# Patient Record
Sex: Male | Born: 1960
Health system: Southern US, Community
[De-identification: ages and names within clinical notes are randomized; demographics above are authoritative.]

## PROBLEM LIST (undated history)

## (undated) DIAGNOSIS — N183 Chronic kidney disease, stage 3 unspecified: Secondary | ICD-10-CM

## (undated) DIAGNOSIS — I484 Atypical atrial flutter: Secondary | ICD-10-CM

## (undated) DIAGNOSIS — I442 Atrioventricular block, complete: Secondary | ICD-10-CM

## (undated) DIAGNOSIS — I2510 Atherosclerotic heart disease of native coronary artery without angina pectoris: Secondary | ICD-10-CM

## (undated) DIAGNOSIS — I472 Ventricular tachycardia: Secondary | ICD-10-CM

## (undated) DIAGNOSIS — G459 Transient cerebral ischemic attack, unspecified: Secondary | ICD-10-CM

## (undated) DIAGNOSIS — I509 Heart failure, unspecified: Secondary | ICD-10-CM

## (undated) DIAGNOSIS — I219 Acute myocardial infarction, unspecified: Secondary | ICD-10-CM

## (undated) DIAGNOSIS — J449 Chronic obstructive pulmonary disease, unspecified: Secondary | ICD-10-CM

## (undated) DIAGNOSIS — E7800 Pure hypercholesterolemia, unspecified: Secondary | ICD-10-CM

## (undated) DIAGNOSIS — F419 Anxiety disorder, unspecified: Secondary | ICD-10-CM

## (undated) DIAGNOSIS — I6521 Occlusion and stenosis of right carotid artery: Secondary | ICD-10-CM

## (undated) DIAGNOSIS — I10 Essential (primary) hypertension: Secondary | ICD-10-CM

## (undated) DIAGNOSIS — E663 Overweight: Secondary | ICD-10-CM

## (undated) HISTORY — DX: Atypical atrial flutter: I48.4

## (undated) HISTORY — DX: Ventricular tachycardia: I47.2

## (undated) HISTORY — PX: EYE MUSCLE SURGERY: SHX370

## (undated) HISTORY — DX: Overweight: E66.3

---

## 2000-11-09 ENCOUNTER — Emergency Department (HOSPITAL_COMMUNITY): Admission: EM | Admit: 2000-11-09 | Discharge: 2000-11-09 | Payer: Self-pay | Admitting: Emergency Medicine

## 2000-11-09 ENCOUNTER — Encounter: Payer: Self-pay | Admitting: Emergency Medicine

## 2001-06-05 ENCOUNTER — Encounter: Admission: RE | Admit: 2001-06-05 | Discharge: 2001-09-03 | Payer: Self-pay | Admitting: Sports Medicine

## 2001-08-08 HISTORY — PX: LACERATION REPAIR: SHX5168

## 2002-11-29 ENCOUNTER — Emergency Department (HOSPITAL_COMMUNITY): Admission: EM | Admit: 2002-11-29 | Discharge: 2002-11-29 | Payer: Self-pay | Admitting: Emergency Medicine

## 2004-01-11 ENCOUNTER — Emergency Department (HOSPITAL_COMMUNITY): Admission: EM | Admit: 2004-01-11 | Discharge: 2004-01-11 | Payer: Self-pay | Admitting: Emergency Medicine

## 2014-08-08 DIAGNOSIS — G459 Transient cerebral ischemic attack, unspecified: Secondary | ICD-10-CM

## 2014-08-08 HISTORY — DX: Transient cerebral ischemic attack, unspecified: G45.9

## 2014-11-23 ENCOUNTER — Emergency Department (HOSPITAL_COMMUNITY)
Admission: EM | Admit: 2014-11-23 | Discharge: 2014-11-23 | Disposition: A | Discharge status: Home / Self Care | Payer: Self-pay | Attending: Emergency Medicine | Admitting: Emergency Medicine

## 2014-11-23 ENCOUNTER — Emergency Department (HOSPITAL_COMMUNITY): Payer: Self-pay

## 2014-11-23 ENCOUNTER — Encounter (HOSPITAL_COMMUNITY): Payer: Self-pay

## 2014-11-23 DIAGNOSIS — Z87891 Personal history of nicotine dependence: Secondary | ICD-10-CM | POA: Insufficient documentation

## 2014-11-23 DIAGNOSIS — G459 Transient cerebral ischemic attack, unspecified: Secondary | ICD-10-CM | POA: Insufficient documentation

## 2014-11-23 DIAGNOSIS — F1210 Cannabis abuse, uncomplicated: Secondary | ICD-10-CM | POA: Insufficient documentation

## 2014-11-23 DIAGNOSIS — I10 Essential (primary) hypertension: Secondary | ICD-10-CM | POA: Insufficient documentation

## 2014-11-23 HISTORY — DX: Essential (primary) hypertension: I10

## 2014-11-23 LAB — COMPREHENSIVE METABOLIC PANEL
ALT: 52 U/L (ref 0–53)
AST: 46 U/L — AB (ref 0–37)
Albumin: 3.7 g/dL (ref 3.5–5.2)
Alkaline Phosphatase: 63 U/L (ref 39–117)
Anion gap: 10 (ref 5–15)
BUN: 12 mg/dL (ref 6–23)
CO2: 23 mmol/L (ref 19–32)
Calcium: 9.3 mg/dL (ref 8.4–10.5)
Chloride: 102 mmol/L (ref 96–112)
Creatinine, Ser: 0.95 mg/dL (ref 0.50–1.35)
GFR calc Af Amer: >90 mL/min (ref >90)
GFR calc non Af Amer: >90 mL/min (ref >90)
Glucose, Bld: 90 mg/dL (ref 70–99)
Potassium: 4.8 mmol/L (ref 3.5–5.1)
Sodium: 135 mmol/L (ref 135–145)
Total Bilirubin: 1.5 mg/dL — ABNORMAL HIGH (ref 0.3–1.2)
Total Protein: 6.6 g/dL (ref 6.0–8.3)

## 2014-11-23 LAB — CBC
HEMATOCRIT: 44.9 % (ref 39.0–52.0)
HEMOGLOBIN: 15.7 g/dL (ref 13.0–17.0)
MCH: 31.7 pg (ref 26.0–34.0)
MCHC: 35 g/dL (ref 30.0–36.0)
MCV: 90.7 fL (ref 78.0–100.0)
Platelets: 203 10*3/uL (ref 150–400)
RBC: 4.95 MIL/uL (ref 4.22–5.81)
RDW: 13.5 % (ref 11.5–15.5)
WBC: 8.6 10*3/uL (ref 4.0–10.5)

## 2014-11-23 LAB — RAPID URINE DRUG SCREEN, HOSP PERFORMED
Amphetamines: NOT DETECTED
Barbiturates: NOT DETECTED
Benzodiazepines: NOT DETECTED
Cocaine: NOT DETECTED
Opiates: NOT DETECTED
Tetrahydrocannabinol: POSITIVE — AB

## 2014-11-23 MED ORDER — ASPIRIN 81 MG PO CHEW
324.0000 mg | CHEWABLE_TABLET | Freq: Once | ORAL | Status: AC
  Administered 2014-11-23: 324 mg via ORAL
  Filled 2014-11-23: qty 4

## 2014-11-23 MED ORDER — LISINOPRIL 10 MG PO TABS
10.0000 mg | ORAL_TABLET | Freq: Once | ORAL | Status: AC
  Administered 2014-11-23: 10 mg via ORAL
  Filled 2014-11-23: qty 1

## 2014-11-23 MED ORDER — GADOBENATE DIMEGLUMINE 529 MG/ML IV SOLN
20.0000 mL | Freq: Once | INTRAVENOUS | Status: AC | PRN
  Administered 2014-11-23: 20 mL via INTRAVENOUS

## 2014-11-23 MED ORDER — LISINOPRIL 20 MG PO TABS: 20.0000 mg | ORAL_TABLET | Freq: Every day | ORAL | Status: DC

## 2014-11-23 NOTE — ED Provider Notes (Signed)
Assumed pt care in sign out with MRa pending. R sided numbness, but with severe ipsilateral R ICA stenosis. Symptoms do not clinically correlate to this. Does additionally have stenosis of L PCA though which could though. Discussed with neurology. Will start on 325 mg ASA. Script provided for lisinopril. Outpt FU with PCP and vascular.   Ct Head Wo Contrast  11/23/2014   CLINICAL DATA:  Onset 2 days ago pt was riding in car, pt had numbness in right arm and right leg. Lasted 5-10 min. Pt concerned. Pt reports tingling in right arm but pt had past injury and has intermittant pain and tingling in that arm. No other s/s noted. No h/o stroke/seizure. No h/o cancer. No surgeries to head.  EXAM: CT HEAD WITHOUT CONTRAST  TECHNIQUE: Contiguous axial images were obtained from the base of the skull through the vertex without intravenous contrast.  COMPARISON:  None.  FINDINGS: Ventricles are normal in configuration. There is ventricular and sulcal enlargement reflecting mild volume loss. No hydrocephalus.  There are no parenchymal masses or mass effect. Subtle areas of white matter hypoattenuation noted likely due to chronic microvascular ischemic change.  There is no evidence a cortical infarct.  There are no extra-axial masses or abnormal fluid collections.  There is no intracranial hemorrhage.  Mucous retention cysts are noted in the maxillary sinuses. Mastoid air cells are clear. No skull lesion.  IMPRESSION: 1. No acute intracranial abnormalities. Mild volume loss and minimal chronic microvascular ischemic change.   Electronically Signed   By: Amie Portland M.D.   On: 11/23/2014 12:48   Mr Shirlee Latch Wo Contrast  11/23/2014   CLINICAL DATA:  54 year old male with episode of transient right side numbness 2 days ago.  Initial encounter.  EXAM: MRA NECK WITHOUT AND WITH CONTRAST  MRA HEAD WITHOUT CONTRAST  TECHNIQUE: Multiplanar and multiecho pulse sequences of the neck were obtained without and with intravenous  contrast. Angiographic images of the neck were obtained using MRA technique without and with intravenous contast.; Angiographic images of the Circle of Willis were obtained using MRA technique without intravenous contrast.  CONTRAST:  35mL MULTIHANCE GADOBENATE DIMEGLUMINE 529 MG/ML IV SOLN  COMPARISON:  Brain MRI without contrast 1321 hours today.  FINDINGS: MRA NECK FINDINGS  Precontrast time-of-flight imaging of the neck reveals antegrade flow in both carotid and vertebral arteries from the thoracic inlet to the skullbase.  Post-contrast MRA images reveal a 3 vessel arch configuration.  Both vertebral artery origins are normal. The right vertebral artery is normal to the vertebrobasilar junction. On the left there is a focal tortuosity in the V2 segment (series 77414, image 14) with a kinked appearance, otherwise no left vertebral artery stenosis.  The right CCA appears normal to the right carotid bifurcation, but there is been a 10 mm segment of severe irregularity at the origin of the right ICA extending into the bulb. High-grade stenosis results, estimated at 70 % with respect to the distal vessel. Despite this, the right ICA remains patent and otherwise appears normal to the skullbase.  The left CCA is normal to the left carotid bifurcation. There is irregularity at the left ICA origin and posterior bulb compatible with atherosclerosis, but no hemodynamically significant stenosis occurs.  MRA HEAD FINDINGS  Antegrade flow in the posterior circulation with codominant distal vertebral arteries. Normal vertebrobasilar junction. Normal right PICA and bilateral AICA origins, the left appears dominant. Tortuous basilar artery without stenosis. SCA and PCA origins are normal. Posterior communicating arteries are diminutive  or absent.  The right PCA branches are within normal limits. There is moderate to severe stenosis of the distal left P2 segment (series 205, image 13) with preserved distal flow.  Symmetric  Antegrade flow in both ICA siphons. No siphon stenosis. Normal ophthalmic artery origins. Normal carotid termini. Normal MCA and ACA origins. Diminutive anterior communicating artery. Visualized ACA branches are within normal limits. Visualized right MCA branches are within normal limits. Left MCA M1 segment, trifurcation, and visualized left MCA branches are within normal limits.  IMPRESSION: 1. Atherosclerosis at the right carotid bifurcation resulting in High-grade right ICA origin stenosis (estimated at 70%). 2. Mild atherosclerosis at the left carotid bifurcation without stenosis. 3. Mild tortuosity and stenosis of the left vertebral artery V2 segment. 4. Moderate to severe stenosis of the left PCA P2 segment. 5. Normal intracranial anterior circulation.   Electronically Signed   By: Odessa Fleming M.D.   On: 11/23/2014 17:34   Mr Angiogram Neck W Wo Contrast  11/23/2014   CLINICAL DATA:  54 year old male with episode of transient right side numbness 2 days ago.  Initial encounter.  EXAM: MRA NECK WITHOUT AND WITH CONTRAST  MRA HEAD WITHOUT CONTRAST  TECHNIQUE: Multiplanar and multiecho pulse sequences of the neck were obtained without and with intravenous contrast. Angiographic images of the neck were obtained using MRA technique without and with intravenous contast.; Angiographic images of the Circle of Willis were obtained using MRA technique without intravenous contrast.  CONTRAST:  20mL MULTIHANCE GADOBENATE DIMEGLUMINE 529 MG/ML IV SOLN  COMPARISON:  Brain MRI without contrast 1321 hours today.  FINDINGS: MRA NECK FINDINGS  Precontrast time-of-flight imaging of the neck reveals antegrade flow in both carotid and vertebral arteries from the thoracic inlet to the skullbase.  Post-contrast MRA images reveal a 3 vessel arch configuration.  Both vertebral artery origins are normal. The right vertebral artery is normal to the vertebrobasilar junction. On the left there is a focal tortuosity in the V2 segment  (series 16109, image 14) with a kinked appearance, otherwise no left vertebral artery stenosis.  The right CCA appears normal to the right carotid bifurcation, but there is been a 10 mm segment of severe irregularity at the origin of the right ICA extending into the bulb. High-grade stenosis results, estimated at 70 % with respect to the distal vessel. Despite this, the right ICA remains patent and otherwise appears normal to the skullbase.  The left CCA is normal to the left carotid bifurcation. There is irregularity at the left ICA origin and posterior bulb compatible with atherosclerosis, but no hemodynamically significant stenosis occurs.  MRA HEAD FINDINGS  Antegrade flow in the posterior circulation with codominant distal vertebral arteries. Normal vertebrobasilar junction. Normal right PICA and bilateral AICA origins, the left appears dominant. Tortuous basilar artery without stenosis. SCA and PCA origins are normal. Posterior communicating arteries are diminutive or absent.  The right PCA branches are within normal limits. There is moderate to severe stenosis of the distal left P2 segment (series 205, image 13) with preserved distal flow.  Symmetric Antegrade flow in both ICA siphons. No siphon stenosis. Normal ophthalmic artery origins. Normal carotid termini. Normal MCA and ACA origins. Diminutive anterior communicating artery. Visualized ACA branches are within normal limits. Visualized right MCA branches are within normal limits. Left MCA M1 segment, trifurcation, and visualized left MCA branches are within normal limits.  IMPRESSION: 1. Atherosclerosis at the right carotid bifurcation resulting in High-grade right ICA origin stenosis (estimated at 70%). 2. Mild atherosclerosis  at the left carotid bifurcation without stenosis. 3. Mild tortuosity and stenosis of the left vertebral artery V2 segment. 4. Moderate to severe stenosis of the left PCA P2 segment. 5. Normal intracranial anterior circulation.    Electronically Signed   By: Odessa Fleming M.D.   On: 11/23/2014 17:34   Mr Brain Wo Contrast  11/23/2014   CLINICAL DATA:  54 year old male with episode of transient right side numbness 2 days ago. Initial encounter.  EXAM: MRI HEAD WITHOUT CONTRAST  TECHNIQUE: Multiplanar, multiecho pulse sequences of the brain and surrounding structures were obtained without intravenous contrast.  COMPARISON:  Head CT without contrast 1248 hours today.  FINDINGS: Cerebral volume is normal. No restricted diffusion to suggest acute infarction. No midline shift, mass effect, evidence of mass lesion, ventriculomegaly, extra-axial collection or acute intracranial hemorrhage. Cervicomedullary junction and pituitary are within normal limits. Major intracranial vascular flow voids are within normal limits.  Scattered subcentimeter and patchy cerebral white matter T2 and FLAIR hyperintensity in a nonspecific configuration. Both periventricular and subcortical involvement is noted. No cortical encephalomalacia identified. No chronic blood products in the brain. Deep gray matter nuclei, brainstem and cerebellum are within normal limits.  Visible internal auditory structures appear normal. Mastoids are clear. Negative paranasal sinuses aside from maxillary mucous retention cyst. Visualized orbit soft tissues are within normal limits. Normal visualized cervical spine. Normal bone marrow signal. Visualized scalp soft tissues are within normal limits.  IMPRESSION: 1.  No acute intracranial abnormality. 2. Moderate for age nonspecific cerebral white matter signal changes. Differential considerations include accelerated small vessel ischemia, demyelination, sequelae of trauma, hypercoagulable state, vasculitis, migraines, or prior infection.   Electronically Signed   By: Odessa Fleming M.D.   On: 11/23/2014 14:32    Raeford Razor, MD 11/23/14 [phone]

## 2014-11-23 NOTE — ED Provider Notes (Signed)
CSN: [id number]     Arrival date & time 11/23/14  1123 History   First MD Initiated Contact with Patient 11/23/14 1205     Chief Complaint  Patient presents with  . Numbness     (Consider location/radiation/quality/duration/timing/severity/associated sxs/prior Treatment) HPI  54 year old male who had an episode of right sided numbness on Friday. He reports he episode as lasting 5-10 minutes. Symptoms resolved and have not returned. He denies any headache. He was sitting in a truck and did not note any weakness. He did not try to speak during the episode. He reports a history of hypertension. He states that he has been off of his medications as his blood pressure had normalized. He was followed at [facility] but has not been seen for 2 years. He reports he follows his blood pressure at home and it is usually in the low 100s. He has not had any previous episodes. He has not had any head injury or headache.  Past Medical History  Diagnosis Date  . Hypertension    Past Surgical History  Procedure Laterality Date  . Arm surgery Right 2003   History reviewed. No pertinent family history. History  Substance Use Topics  . Smoking status: Former Games developer  . Smokeless tobacco: Not on file  . Alcohol Use: No    Review of Systems  All other systems reviewed and are negative.     Allergies  Review of patient's allergies indicates no known allergies.  Home Medications   Prior to Admission medications   Not on File   BP 166/102 mmHg  Pulse 66  Temp(Src) 98.1 F (36.7 C) (Oral)  Resp 16  Ht 5' 10.5" (1.791 m)  Wt 240 lb 4.8 oz (108.999 kg)  BMI 33.98 kg/m2  SpO2 97% Physical Exam  Constitutional: He is oriented to person, place, and time. He appears well-developed and well-nourished.  HENT:  Head: Normocephalic and atraumatic.  Right Ear: Tympanic membrane and external ear normal.  Left Ear: Tympanic membrane and external ear normal.  Nose: Nose normal. Right sinus exhibits no  maxillary sinus tenderness and no frontal sinus tenderness. Left sinus exhibits no maxillary sinus tenderness and no frontal sinus tenderness.  Eyes: Conjunctivae and EOM are normal. Pupils are equal, round, and reactive to light. Right eye exhibits no nystagmus. Left eye exhibits no nystagmus.  Neck: Normal range of motion. Neck supple.  Cardiovascular: Normal rate, regular rhythm, normal heart sounds and intact distal pulses.   Pulmonary/Chest: Effort normal and breath sounds normal. No respiratory distress. He exhibits no tenderness.  Abdominal: Soft. Bowel sounds are normal. He exhibits no distension and no mass. There is no tenderness.  Musculoskeletal: Normal range of motion. He exhibits no edema or tenderness.  Neurological: He is alert and oriented to person, place, and time. He has normal strength and normal reflexes. No sensory deficit. He displays a negative Romberg sign. GCS eye subscore is 4. GCS verbal subscore is 5. GCS motor subscore is 6.  Reflex Scores:      Tricep reflexes are 2+ on the right side and 2+ on the left side.      Bicep reflexes are 2+ on the right side and 2+ on the left side.      Brachioradialis reflexes are 2+ on the right side and 2+ on the left side.      Patellar reflexes are 2+ on the right side and 2+ on the left side.      Achilles reflexes are 2+ on the  right side and 2+ on the left side. Patient with normal gait without ataxia, shuffling, spasm, or antalgia. Speech is normal without dysarthria, dysphasia, or aphasia. Muscle strength is 5/5 in bilateral shoulders, elbow flexor and extensors, wrist flexor and extensors, and intrinsic hand muscles. 5/5 bilateral lower extremity hip flexors, extensors, knee flexors and extensors, and ankle dorsi and plantar flexors.    Skin: Skin is warm and dry. No rash noted.  Psychiatric: He has a normal mood and affect. His behavior is normal. Judgment and thought content normal.  Nursing note and vitals  reviewed.   ED Course  Procedures (including critical care time) Labs Review Labs Reviewed  COMPREHENSIVE METABOLIC PANEL - Abnormal; Notable for the following:    AST 46 (*)    Total Bilirubin 1.5 (*)    All other components within normal limits  CBC  URINE RAPID DRUG SCREEN (HOSP PERFORMED)    Imaging Review Ct Head Wo Contrast  11/23/2014   CLINICAL DATA:  Onset 2 days ago pt was riding in car, pt had numbness in right arm and right leg. Lasted 5-10 min. Pt concerned. Pt reports tingling in right arm but pt had past injury and has intermittant pain and tingling in that arm. No other s/s noted. No h/o stroke/seizure. No h/o cancer. No surgeries to head.  EXAM: CT HEAD WITHOUT CONTRAST  TECHNIQUE: Contiguous axial images were obtained from the base of the skull through the vertex without intravenous contrast.  COMPARISON:  None.  FINDINGS: Ventricles are normal in configuration. There is ventricular and sulcal enlargement reflecting mild volume loss. No hydrocephalus.  There are no parenchymal masses or mass effect. Subtle areas of white matter hypoattenuation noted likely due to chronic microvascular ischemic change.  There is no evidence a cortical infarct.  There are no extra-axial masses or abnormal fluid collections.  There is no intracranial hemorrhage.  Mucous retention cysts are noted in the maxillary sinuses. Mastoid air cells are clear. No skull lesion.  IMPRESSION: 1. No acute intracranial abnormalities. Mild volume loss and minimal chronic microvascular ischemic change.   Electronically Signed   By: Amie Portland M.D.   On: 11/23/2014 12:48   Mr Brain Wo Contrast  11/23/2014   CLINICAL DATA:  54 year old male with episode of transient right side numbness 2 days ago. Initial encounter.  EXAM: MRI HEAD WITHOUT CONTRAST  TECHNIQUE: Multiplanar, multiecho pulse sequences of the brain and surrounding structures were obtained without intravenous contrast.  COMPARISON:  Head CT without  contrast 1248 hours today.  FINDINGS: Cerebral volume is normal. No restricted diffusion to suggest acute infarction. No midline shift, mass effect, evidence of mass lesion, ventriculomegaly, extra-axial collection or acute intracranial hemorrhage. Cervicomedullary junction and pituitary are within normal limits. Major intracranial vascular flow voids are within normal limits.  Scattered subcentimeter and patchy cerebral white matter T2 and FLAIR hyperintensity in a nonspecific configuration. Both periventricular and subcortical involvement is noted. No cortical encephalomalacia identified. No chronic blood products in the brain. Deep gray matter nuclei, brainstem and cerebellum are within normal limits.  Visible internal auditory structures appear normal. Mastoids are clear. Negative paranasal sinuses aside from maxillary mucous retention cyst. Visualized orbit soft tissues are within normal limits. Normal visualized cervical spine. Normal bone marrow signal. Visualized scalp soft tissues are within normal limits.  IMPRESSION: 1.  No acute intracranial abnormality. 2. Moderate for age nonspecific cerebral white matter signal changes. Differential considerations include accelerated small vessel ischemia, demyelination, sequelae of trauma, hypercoagulable state, vasculitis, migraines,  or prior infection.   Electronically Signed   By: Odessa Fleming M.D.   On: 11/23/2014 14:32     EKG Interpretation   Date/Time:  Sunday November 23 2014 12:27:20 EDT Ventricular Rate:  68 PR Interval:  170 QRS Duration: 93 QT Interval:  386 QTC Calculation: 410 R Axis:   -8 Text Interpretation:  Sinus rhythm Low voltage, precordial leads Confirmed  by Madix Blowe MD, Duwayne Heck ([id number]) on 11/23/2014 1:44:25 PM      MDM   Final diagnoses:  Transient cerebral ischemia, unspecified transient cerebral ischemia type  Essential hypertension    ABCD2 score- 3-4 Patient care discussed with Dr. Leroy Kennedy.  He advises MR a of brain and neck.  Patient does not currently have outpatient provider. Carotid ultrasounds of neck not obtainable on the weekend. Given the patient's borderline ABCD 2score, and otherwise normal workup, carotid assessment is essential in valuation and patient disposition. I have discussed this the patient he voices understanding and is currently undergoing MRA of head and neck. Dr. Kellie Shropshire can be reconsulted if this is abnormal, otherwise patient can be discharged with follow-up. He is given lisinopril 10 mg here which is what he has been on in the past. Plan to restart his antihypertensives and have close follow-up of blood pressure.  MRI brain with some a moderate changes in white matter signal of unclear etiology.  Plan referral to neurology for follow up.   Discussed with Dr. Juleen China and he will review mra and arrange admission or follow up.     Margarita Grizzle, MD 11/23/14 [phone]

## 2014-11-23 NOTE — ED Notes (Signed)
Pt states he "can not urinate and will not be able to any time soon"

## 2014-11-23 NOTE — Discharge Instructions (Signed)
Hypertension Hypertension is another name for high blood pressure. High blood pressure forces your heart to work harder to pump blood. A blood pressure reading has two numbers, which includes a higher number over a lower number (example: 110/72). HOME CARE   Have your blood pressure rechecked by your doctor.  Only take medicine as told by your doctor. Follow the directions carefully. The medicine does not work as well if you skip doses. Skipping doses also puts you at risk for problems.  Do not smoke.  Monitor your blood pressure at home as told by your doctor. GET HELP IF:  You think you are having a reaction to the medicine you are taking.  You have repeat headaches or feel dizzy.  You have puffiness (swelling) in your ankles.  You have trouble with your vision. GET HELP RIGHT AWAY IF:   You get a very bad headache and are confused.  You feel weak, numb, or faint.  You get chest or belly (abdominal) pain.  You throw up (vomit).  You cannot breathe very well. MAKE SURE YOU:   Understand these instructions.  Will watch your condition.  Will get help right away if you are not doing well or get worse. Document Released: 01/11/2008 Document Revised: 07/30/2013 Document Reviewed: 05/17/2013 [facility] Patient Information 2015 [place], [place]. This information is not intended to replace advice given to you by your health care provider. Make sure you discuss any questions you have with your health care provider. Transient Ischemic Attack A transient ischemic attack (TIA) is a "warning stroke" that causes stroke-like symptoms. Unlike a stroke, a TIA does not cause permanent damage to the brain. The symptoms of a TIA can happen very fast and do not last long. It is important to know the symptoms of a TIA and what to do. This can help prevent a major stroke or death. CAUSES   A TIA is caused by a temporary blockage in an artery in the brain or neck (carotid artery). The blockage does  not allow the brain to get the blood supply it needs and can cause different symptoms. The blockage can be caused by either:  A blood clot.  Fatty buildup (plaque) in a neck or brain artery. RISK FACTORS  High blood pressure (hypertension).  High cholesterol.  Diabetes mellitus.  Heart disease.  The build up of plaque in the blood vessels (peripheral artery disease or atherosclerosis).  The build up of plaque in the blood vessels providing blood and oxygen to the brain (carotid artery stenosis).  An abnormal heart rhythm (atrial fibrillation).  Obesity.  Smoking.  Taking oral contraceptives (especially in combination with smoking).  Physical inactivity.  A diet high in fats, salt (sodium), and calories.  Alcohol use.  Use of illegal drugs (especially cocaine and methamphetamine).  Being male.  Being African American.  Being over the age of 51.  Family history of stroke.  Previous history of blood clots, stroke, TIA, or heart attack.  Sickle cell disease. SYMPTOMS  TIA symptoms are the same as a stroke but are temporary. These symptoms usually develop suddenly, or may be newly present upon awakening from sleep:  Sudden weakness or numbness of the face, arm, or leg, especially on one side of the body.  Sudden trouble walking or difficulty moving arms or legs.  Sudden confusion.  Sudden personality changes.  Trouble speaking (aphasia) or understanding.  Difficulty swallowing.  Sudden trouble seeing in one or both eyes.  Double vision.  Dizziness.  Loss of  balance or coordination.  Sudden severe headache with no known cause.  Trouble reading or writing.  Loss of bowel or bladder control.  Loss of consciousness. DIAGNOSIS  Your caregiver may be able to determine the presence or absence of a TIA based on your symptoms, history, and physical exam. Computed tomography (CT scan) of the brain is usually performed to help identify a TIA. Other tests  may be done to diagnose a TIA. These tests may include:  Electrocardiography.  Continuous heart monitoring.  Echocardiography.  Carotid ultrasonography.  Magnetic resonance imaging (MRI).  A scan of the brain circulation.  Blood tests. PREVENTION  The risk of a TIA can be decreased by appropriately treating high blood pressure, high cholesterol, diabetes, heart disease, and obesity and by quitting smoking, limiting alcohol, and staying physically active. TREATMENT  Time is of the essence. Since the symptoms of TIA are the same as a stroke, it is important to seek treatment as soon as possible because you may need a medicine to dissolve the clot (thrombolytic) that cannot be given if too much time has passed. Treatment options vary. Treatment options may include rest, oxygen, intravenous (IV) fluids, and medicines to thin the blood (anticoagulants). Medicines and diet may be used to address diabetes, high blood pressure, and other risk factors. Measures will be taken to prevent short-term and long-term complications, including infection from breathing foreign material into the lungs (aspiration pneumonia), blood clots in the legs, and falls. Treatment options include procedures to either remove plaque in the carotid arteries or dilate carotid arteries that have narrowed due to plaque. Those procedures are:  Carotid endarterectomy.  Carotid angioplasty and stenting. HOME CARE INSTRUCTIONS   Take all medicines prescribed by your caregiver. Follow the directions carefully. Medicines may be used to control risk factors for a stroke. Be sure you understand all your medicine instructions.  You may be told to take aspirin or the anticoagulant warfarin. Warfarin needs to be taken exactly as instructed.  Taking too much or too little warfarin is dangerous. Too much warfarin increases the risk of bleeding. Too little warfarin continues to allow the risk for blood clots. While taking warfarin, you  will need to have regular blood tests to measure your blood clotting time. A PT blood test measures how long it takes for blood to clot. Your PT is used to calculate another value called an INR. Your PT and INR help your caregiver to adjust your dose of warfarin. The dose can change for many reasons. It is critically important that you take warfarin exactly as prescribed.  Many foods, especially foods high in vitamin K can interfere with warfarin and affect the PT and INR. Foods high in vitamin K include spinach, kale, broccoli, cabbage, collard and turnip greens, brussels sprouts, peas, cauliflower, seaweed, and parsley as well as beef and pork liver, green tea, and soybean oil. You should eat a consistent amount of foods high in vitamin K. Avoid major changes in your diet, or notify your caregiver before changing your diet. Arrange a visit with a dietitian to answer your questions.  Many medicines can interfere with warfarin and affect the PT and INR. You must tell your caregiver about any and all medicines you take, this includes all vitamins and supplements. Be especially cautious with aspirin and anti-inflammatory medicines. Do not take or discontinue any prescribed or over-the-counter medicine except on the advice of your caregiver or pharmacist.  Warfarin can have side effects, such as excessive bruising or bleeding. You  will need to hold pressure over cuts for longer than usual. Your caregiver or pharmacist will discuss other potential side effects.  Avoid sports or activities that may cause injury or bleeding.  Be mindful when shaving, flossing your teeth, or handling sharp objects.  Alcohol can change the body's ability to handle warfarin. It is best to avoid alcoholic drinks or consume only very small amounts while taking warfarin. Notify your caregiver if you change your alcohol intake.  Notify your dentist or other caregivers before procedures.  Eat a diet that includes 5 or more  servings of fruits and vegetables each day. This may reduce the risk of stroke. Certain diets may be prescribed to address high blood pressure, high cholesterol, diabetes, or obesity.  A low-sodium, low-saturated fat, low-trans fat, low-cholesterol diet is recommended to manage high blood pressure.  A low-saturated fat, low-trans fat, low-cholesterol, and high-fiber diet may control cholesterol levels.  A controlled-carbohydrate, controlled-sugar diet is recommended to manage diabetes.  A reduced-calorie, low-sodium, low-saturated fat, low-trans fat, low-cholesterol diet is recommended to manage obesity.  Maintain a healthy weight.  Stay physically active. It is recommended that you get at least 30 minutes of activity on most or all days.  Do not smoke.  Limit alcohol use even if you are not taking warfarin. Moderate alcohol use is considered to be:  No more than 2 drinks each day for men.  No more than 1 drink each day for nonpregnant women.  Stop drug abuse.  Home safety. A safe home environment is important to reduce the risk of falls. Your caregiver may arrange for specialists to evaluate your home. Having grab bars in the bedroom and bathroom is often important. Your caregiver may arrange for equipment to be used at home, such as raised toilets and a seat for the shower.  Follow all instructions for follow-up with your caregiver. This is very important. This includes any referrals and lab tests. Proper follow up can prevent a stroke or another TIA from occurring. SEEK MEDICAL CARE IF:  You have personality changes.  You have difficulty swallowing.  You are seeing double.  You have dizziness.  You have a fever.  You have skin breakdown. SEEK IMMEDIATE MEDICAL CARE IF:  Any of these symptoms may represent a serious problem that is an emergency. Do not wait to see if the symptoms will go away. Get medical help right away. Call your local emergency services (911 in [place] Do  not drive yourself to the hospital.  You have sudden weakness or numbness of the face, arm, or leg, especially on one side of the body.  You have sudden trouble walking or difficulty moving arms or legs.  You have sudden confusion.  You have trouble speaking (aphasia) or understanding.  You have sudden trouble seeing in one or both eyes.  You have a loss of balance or coordination.  You have a sudden, severe headache with no known cause.  You have new chest pain or an irregular heartbeat.  You have a partial or total loss of consciousness. MAKE SURE YOU:   Understand these instructions.  Will watch your condition.  Will get help right away if you are not doing well or get worse. Document Released: 05/04/2005 Document Revised: 07/30/2013 Document Reviewed: 10/30/2013 [facility] Patient Information 2015 [place], [place]. This information is not intended to replace advice given to you by your health care provider. Make sure you discuss any questions you have with your health care provider.  DASH Eating Plan  DASH stands for "Dietary Approaches to Stop Hypertension." The DASH eating plan is a healthy eating plan that has been shown to reduce high blood pressure (hypertension). Additional health benefits may include reducing the risk of type 2 diabetes mellitus, heart disease, and stroke. The DASH eating plan may also help with weight loss. WHAT DO I NEED TO KNOW ABOUT THE DASH EATING PLAN? For the DASH eating plan, you will follow these general guidelines:  Choose foods with a percent daily value for sodium of less than 5% (as listed on the food label).  Use salt-free seasonings or herbs instead of table salt or sea salt.  Check with your health care provider or pharmacist before using salt substitutes.  Eat lower-sodium products, often labeled as "lower sodium" or "no salt added."  Eat fresh foods.  Eat more vegetables, fruits, and low-fat dairy products.  Choose whole grains.  Look for the word "whole" as the first word in the ingredient list.  Choose fish and skinless chicken or [place] more often than red meat. Limit fish, poultry, and meat to 6 oz (170 g) each day.  Limit sweets, desserts, sugars, and sugary drinks.  Choose heart-healthy fats.  Limit cheese to 1 oz (28 g) per day.  Eat more home-cooked food and less restaurant, buffet, and fast food.  Limit fried foods.  Cook foods using methods other than frying.  Limit canned vegetables. If you do use them, rinse them well to decrease the sodium.  When eating at a restaurant, ask that your food be prepared with less salt, or no salt if possible. WHAT FOODS CAN I EAT? Seek help from a dietitian for individual calorie needs. Grains Whole grain or whole wheat bread. Brown rice. Whole grain or whole wheat pasta. Quinoa, bulgur, and whole grain cereals. Low-sodium cereals. Corn or whole wheat flour tortillas. Whole grain cornbread. Whole grain crackers. Low-sodium crackers. Vegetables Fresh or frozen vegetables (raw, steamed, roasted, or grilled). Low-sodium or reduced-sodium tomato and vegetable juices. Low-sodium or reduced-sodium tomato sauce and paste. Low-sodium or reduced-sodium canned vegetables.  Fruits All fresh, canned (in natural juice), or frozen fruits. Meat and Other Protein Products Ground beef (85% or leaner), grass-fed beef, or beef trimmed of fat. Skinless chicken or [place]. Ground chicken or [place]. Pork trimmed of fat. All fish and seafood. Eggs. Dried beans, peas, or lentils. Unsalted nuts and seeds. Unsalted canned beans. Dairy Low-fat dairy products, such as skim or 1% milk, 2% or reduced-fat cheeses, low-fat ricotta or cottage cheese, or plain low-fat yogurt. Low-sodium or reduced-sodium cheeses. Fats and Oils Tub margarines without trans fats. Light or reduced-fat mayonnaise and salad dressings (reduced sodium). Avocado. Safflower, olive, or canola oils. Natural peanut or almond  butter. Other Unsalted popcorn and pretzels. The items listed above may not be a complete list of recommended foods or beverages. Contact your dietitian for more options. WHAT FOODS ARE NOT RECOMMENDED? Grains White bread. White pasta. White rice. Refined cornbread. Bagels and croissants. Crackers that contain trans fat. Vegetables Creamed or fried vegetables. Vegetables in a cheese sauce. Regular canned vegetables. Regular canned tomato sauce and paste. Regular tomato and vegetable juices. Fruits Dried fruits. Canned fruit in light or heavy syrup. Fruit juice. Meat and Other Protein Products Fatty cuts of meat. Ribs, chicken wings, bacon, sausage, bologna, salami, chitterlings, fatback, hot dogs, bratwurst, and packaged luncheon meats. Salted nuts and seeds. Canned beans with salt. Dairy Whole or 2% milk, cream, half-and-half, and cream cheese. Whole-fat or sweetened yogurt. Full-fat cheeses or  blue cheese. Nondairy creamers and whipped toppings. Processed cheese, cheese spreads, or cheese curds. Condiments Onion and garlic salt, seasoned salt, table salt, and sea salt. Canned and packaged gravies. Worcestershire sauce. Tartar sauce. Barbecue sauce. Teriyaki sauce. Soy sauce, including reduced sodium. Steak sauce. Fish sauce. Oyster sauce. Cocktail sauce. Horseradish. Ketchup and mustard. Meat flavorings and tenderizers. Bouillon cubes. Hot sauce. Tabasco sauce. Marinades. Taco seasonings. Relishes. Fats and Oils Butter, stick margarine, lard, shortening, ghee, and bacon fat. Coconut, palm kernel, or palm oils. Regular salad dressings. Other Pickles and olives. Salted popcorn and pretzels. The items listed above may not be a complete list of foods and beverages to avoid. Contact your dietitian for more information. WHERE CAN I FIND MORE INFORMATION? National Heart, Lung, and Blood Institute: [employer] Document Released: 07/14/2011 Document Revised:  12/09/2013 Document Reviewed: 05/29/2013 [facility] Patient Information 2015 [place], [place]. This information is not intended to replace advice given to you by your health care provider. Make sure you discuss any questions you have with your health care provider.  Carotid Artery Disease The carotid arteries are the two main arteries on either side of the neck that supply blood to the brain. Carotid artery disease, also called carotid artery stenosis, is the narrowing or blockage of one or both carotid arteries. Carotid artery disease increases your risk for a stroke or a transient ischemic attack (TIA). A TIA is an episode in which a waxy, fatty substance that accumulates within the artery (plaque) blocks blood flow to the brain. A TIA is considered a "warning stroke."  CAUSES   Buildup of plaque inside the carotid arteries (atherosclerosis) (common).  A weakened outpouching in an artery (aneurysm).  Inflammation of the carotid artery (arteritis).  A fibrous growth within the carotid artery (fibromuscular dysplasia).  Tissue death within the carotid artery due to radiation treatment (post-radiation necrosis).  Decreased blood flow due to spasms of the carotid artery (vasospasm).  Separation of the walls of the carotid artery (carotid dissection). RISK FACTORS  High cholesterol (dyslipidemia).   High blood pressure (hypertension).   Smoking.   Obesity.   Diabetes.   Family history of cardiovascular disease.   Inactivity or lack of regular exercise.   Being male. Men have an increased risk of developing atherosclerosis earlier in life than women.  SYMPTOMS  Carotid artery disease does not cause symptoms. DIAGNOSIS Diagnosis of carotid artery disease may include:   A physical exam. Your health care provider may hear an abnormal sound (bruit) when listening to the carotid arteries.   Specific tests that look at the blood flow in the carotid arteries. These tests  include:   Carotid artery ultrasonography.   Carotid or cerebral angiography.   Computerized tomographic angiography (CTA).   Magnetic resonance angiography (MRA).  TREATMENT  Treatment of carotid artery disease can include a combination of treatments. Treatment options include:  Surgery. You may have:   A carotid endarterectomy. This is a surgery to remove the blockages in the carotid arteries.   A carotid angioplasty with stenting. This is a nonsurgical interventional procedure. A wire mesh (stent) is used to widen the blocked carotid arteries.   Medicines to control blood pressure, cholesterol, and reduce blood clotting (antiplatelet therapy).   Adjusting your diet.   Lifestyle changes such as:   Quitting smoking.   Exercising as tolerated or as directed by your health care provider.   Controlling and maintaining a good blood pressure.   Keeping cholesterol levels under control.  HOME CARE INSTRUCTIONS  Take medicines only as directed by your health care provider. Make sure you understand all your medicine instructions. Do not stop your medicines without talking to your health care provider.   Follow your health care provider's diet instructions. It is important to eat a healthy diet that is low in saturated fats and includes plenty of fresh fruits, vegetables, and lean meats. High-fat, high-sodium foods as well as foods that are fried, overly processed, or have poor nutritional value should be avoided.  Maintain a healthy weight.   Stay physically active. It is recommended that you get at least 30 minutes of activity every day.   Do not use any tobacco products including cigarettes, chewing tobacco, or electronic cigarettes. If you need help quitting, ask your health care provider.  Limit alcohol use to:   No more than 2 drinks per day for men.   No more than 1 drink per day for nonpregnant women.   Do not use illegal drugs.   Keep all  follow-up visits as directed by your health care provider.  SEEK IMMEDIATE MEDICAL CARE IF:  You develop TIA or stroke symptoms. These include:   Sudden weakness or numbness on one side of the body, such as in the face, arm, or leg.   Sudden confusion.   Trouble speaking (aphasia) or understanding.   Sudden trouble seeing out of one or both eyes.   Sudden trouble walking.   Dizziness or feeling like you might faint.   Loss of balance or coordination.   Sudden severe headache with no known cause.   Sudden trouble swallowing (dysphagia).  If you have any of these symptoms, call your local emergency services (911 in [place] Do not drive yourself to the clinic or hospital. This is a medical emergency.  Document Released: 10/17/2011 Document Revised: 12/09/2013 Document Reviewed: 01/23/2013 [facility] Patient Information 2015 [place], [place]. This information is not intended to replace advice given to you by your health care provider. Make sure you discuss any questions you have with your health care provider.  Carotid Artery Disease The carotid arteries are the two main arteries on either side of the neck that supply blood to the brain. Carotid artery disease, also called carotid artery stenosis, is the narrowing or blockage of one or both carotid arteries. Carotid artery disease increases your risk for a stroke or a transient ischemic attack (TIA). A TIA is an episode in which a waxy, fatty substance that accumulates within the artery (plaque) blocks blood flow to the brain. A TIA is considered a "warning stroke."  CAUSES   Buildup of plaque inside the carotid arteries (atherosclerosis) (common).  A weakened outpouching in an artery (aneurysm).  Inflammation of the carotid artery (arteritis).  A fibrous growth within the carotid artery (fibromuscular dysplasia).  Tissue death within the carotid artery due to radiation treatment (post-radiation necrosis).  Decreased blood  flow due to spasms of the carotid artery (vasospasm).  Separation of the walls of the carotid artery (carotid dissection). RISK FACTORS  High cholesterol (dyslipidemia).   High blood pressure (hypertension).   Smoking.   Obesity.   Diabetes.   Family history of cardiovascular disease.   Inactivity or lack of regular exercise.   Being male. Men have an increased risk of developing atherosclerosis earlier in life than women.  SYMPTOMS  Carotid artery disease does not cause symptoms. DIAGNOSIS Diagnosis of carotid artery disease may include:   A physical exam. Your health care provider may hear an abnormal sound (bruit) when listening  to the carotid arteries.   Specific tests that look at the blood flow in the carotid arteries. These tests include:   Carotid artery ultrasonography.   Carotid or cerebral angiography.   Computerized tomographic angiography (CTA).   Magnetic resonance angiography (MRA).  TREATMENT  Treatment of carotid artery disease can include a combination of treatments. Treatment options include:  Surgery. You may have:   A carotid endarterectomy. This is a surgery to remove the blockages in the carotid arteries.   A carotid angioplasty with stenting. This is a nonsurgical interventional procedure. A wire mesh (stent) is used to widen the blocked carotid arteries.   Medicines to control blood pressure, cholesterol, and reduce blood clotting (antiplatelet therapy).   Adjusting your diet.   Lifestyle changes such as:   Quitting smoking.   Exercising as tolerated or as directed by your health care provider.   Controlling and maintaining a good blood pressure.   Keeping cholesterol levels under control.  HOME CARE INSTRUCTIONS   Take medicines only as directed by your health care provider. Make sure you understand all your medicine instructions. Do not stop your medicines without talking to your health care provider.    Follow your health care provider's diet instructions. It is important to eat a healthy diet that is low in saturated fats and includes plenty of fresh fruits, vegetables, and lean meats. High-fat, high-sodium foods as well as foods that are fried, overly processed, or have poor nutritional value should be avoided.  Maintain a healthy weight.   Stay physically active. It is recommended that you get at least 30 minutes of activity every day.   Do not use any tobacco products including cigarettes, chewing tobacco, or electronic cigarettes. If you need help quitting, ask your health care provider.  Limit alcohol use to:   No more than 2 drinks per day for men.   No more than 1 drink per day for nonpregnant women.   Do not use illegal drugs.   Keep all follow-up visits as directed by your health care provider.  SEEK IMMEDIATE MEDICAL CARE IF:  You develop TIA or stroke symptoms. These include:   Sudden weakness or numbness on one side of the body, such as in the face, arm, or leg.   Sudden confusion.   Trouble speaking (aphasia) or understanding.   Sudden trouble seeing out of one or both eyes.   Sudden trouble walking.   Dizziness or feeling like you might faint.   Loss of balance or coordination.   Sudden severe headache with no known cause.   Sudden trouble swallowing (dysphagia).  If you have any of these symptoms, call your local emergency services (911 in [place] Do not drive yourself to the clinic or hospital. This is a medical emergency.  Document Released: 10/17/2011 Document Revised: 12/09/2013 Document Reviewed: 01/23/2013 [facility] Patient Information 2015 [place], [place]. This information is not intended to replace advice given to you by your health care provider. Make sure you discuss any questions you have with your health care provider.  Transient Ischemic Attack A transient ischemic attack (TIA) is a "warning stroke" that causes stroke-like  symptoms. Unlike a stroke, a TIA does not cause permanent damage to the brain. The symptoms of a TIA can happen very fast and do not last long. It is important to know the symptoms of a TIA and what to do. This can help prevent a major stroke or death. CAUSES   A TIA is caused by a temporary blockage  in an artery in the brain or neck (carotid artery). The blockage does not allow the brain to get the blood supply it needs and can cause different symptoms. The blockage can be caused by either:  A blood clot.  Fatty buildup (plaque) in a neck or brain artery. RISK FACTORS  High blood pressure (hypertension).  High cholesterol.  Diabetes mellitus.  Heart disease.  The build up of plaque in the blood vessels (peripheral artery disease or atherosclerosis).  The build up of plaque in the blood vessels providing blood and oxygen to the brain (carotid artery stenosis).  An abnormal heart rhythm (atrial fibrillation).  Obesity.  Smoking.  Taking oral contraceptives (especially in combination with smoking).  Physical inactivity.  A diet high in fats, salt (sodium), and calories.  Alcohol use.  Use of illegal drugs (especially cocaine and methamphetamine).  Being male.  Being African American.  Being over the age of 38.  Family history of stroke.  Previous history of blood clots, stroke, TIA, or heart attack.  Sickle cell disease. SYMPTOMS  TIA symptoms are the same as a stroke but are temporary. These symptoms usually develop suddenly, or may be newly present upon awakening from sleep:  Sudden weakness or numbness of the face, arm, or leg, especially on one side of the body.  Sudden trouble walking or difficulty moving arms or legs.  Sudden confusion.  Sudden personality changes.  Trouble speaking (aphasia) or understanding.  Difficulty swallowing.  Sudden trouble seeing in one or both eyes.  Double vision.  Dizziness.  Loss of balance or  coordination.  Sudden severe headache with no known cause.  Trouble reading or writing.  Loss of bowel or bladder control.  Loss of consciousness. DIAGNOSIS  Your caregiver may be able to determine the presence or absence of a TIA based on your symptoms, history, and physical exam. Computed tomography (CT scan) of the brain is usually performed to help identify a TIA. Other tests may be done to diagnose a TIA. These tests may include:  Electrocardiography.  Continuous heart monitoring.  Echocardiography.  Carotid ultrasonography.  Magnetic resonance imaging (MRI).  A scan of the brain circulation.  Blood tests. PREVENTION  The risk of a TIA can be decreased by appropriately treating high blood pressure, high cholesterol, diabetes, heart disease, and obesity and by quitting smoking, limiting alcohol, and staying physically active. TREATMENT  Time is of the essence. Since the symptoms of TIA are the same as a stroke, it is important to seek treatment as soon as possible because you may need a medicine to dissolve the clot (thrombolytic) that cannot be given if too much time has passed. Treatment options vary. Treatment options may include rest, oxygen, intravenous (IV) fluids, and medicines to thin the blood (anticoagulants). Medicines and diet may be used to address diabetes, high blood pressure, and other risk factors. Measures will be taken to prevent short-term and long-term complications, including infection from breathing foreign material into the lungs (aspiration pneumonia), blood clots in the legs, and falls. Treatment options include procedures to either remove plaque in the carotid arteries or dilate carotid arteries that have narrowed due to plaque. Those procedures are:  Carotid endarterectomy.  Carotid angioplasty and stenting. HOME CARE INSTRUCTIONS   Take all medicines prescribed by your caregiver. Follow the directions carefully. Medicines may be used to control risk  factors for a stroke. Be sure you understand all your medicine instructions.  You may be told to take aspirin or the anticoagulant warfarin.  Warfarin needs to be taken exactly as instructed.  Taking too much or too little warfarin is dangerous. Too much warfarin increases the risk of bleeding. Too little warfarin continues to allow the risk for blood clots. While taking warfarin, you will need to have regular blood tests to measure your blood clotting time. A PT blood test measures how long it takes for blood to clot. Your PT is used to calculate another value called an INR. Your PT and INR help your caregiver to adjust your dose of warfarin. The dose can change for many reasons. It is critically important that you take warfarin exactly as prescribed.  Many foods, especially foods high in vitamin K can interfere with warfarin and affect the PT and INR. Foods high in vitamin K include spinach, kale, broccoli, cabbage, collard and turnip greens, brussels sprouts, peas, cauliflower, seaweed, and parsley as well as beef and pork liver, green tea, and soybean oil. You should eat a consistent amount of foods high in vitamin K. Avoid major changes in your diet, or notify your caregiver before changing your diet. Arrange a visit with a dietitian to answer your questions.  Many medicines can interfere with warfarin and affect the PT and INR. You must tell your caregiver about any and all medicines you take, this includes all vitamins and supplements. Be especially cautious with aspirin and anti-inflammatory medicines. Do not take or discontinue any prescribed or over-the-counter medicine except on the advice of your caregiver or pharmacist.  Warfarin can have side effects, such as excessive bruising or bleeding. You will need to hold pressure over cuts for longer than usual. Your caregiver or pharmacist will discuss other potential side effects.  Avoid sports or activities that may cause injury or bleeding.  Be  mindful when shaving, flossing your teeth, or handling sharp objects.  Alcohol can change the body's ability to handle warfarin. It is best to avoid alcoholic drinks or consume only very small amounts while taking warfarin. Notify your caregiver if you change your alcohol intake.  Notify your dentist or other caregivers before procedures.  Eat a diet that includes 5 or more servings of fruits and vegetables each day. This may reduce the risk of stroke. Certain diets may be prescribed to address high blood pressure, high cholesterol, diabetes, or obesity.  A low-sodium, low-saturated fat, low-trans fat, low-cholesterol diet is recommended to manage high blood pressure.  A low-saturated fat, low-trans fat, low-cholesterol, and high-fiber diet may control cholesterol levels.  A controlled-carbohydrate, controlled-sugar diet is recommended to manage diabetes.  A reduced-calorie, low-sodium, low-saturated fat, low-trans fat, low-cholesterol diet is recommended to manage obesity.  Maintain a healthy weight.  Stay physically active. It is recommended that you get at least 30 minutes of activity on most or all days.  Do not smoke.  Limit alcohol use even if you are not taking warfarin. Moderate alcohol use is considered to be:  No more than 2 drinks each day for men.  No more than 1 drink each day for nonpregnant women.  Stop drug abuse.  Home safety. A safe home environment is important to reduce the risk of falls. Your caregiver may arrange for specialists to evaluate your home. Having grab bars in the bedroom and bathroom is often important. Your caregiver may arrange for equipment to be used at home, such as raised toilets and a seat for the shower.  Follow all instructions for follow-up with your caregiver. This is very important. This includes any referrals and lab tests.  Proper follow up can prevent a stroke or another TIA from occurring. SEEK MEDICAL CARE IF:  You have personality  changes.  You have difficulty swallowing.  You are seeing double.  You have dizziness.  You have a fever.  You have skin breakdown. SEEK IMMEDIATE MEDICAL CARE IF:  Any of these symptoms may represent a serious problem that is an emergency. Do not wait to see if the symptoms will go away. Get medical help right away. Call your local emergency services (911 in [place] Do not drive yourself to the hospital.  You have sudden weakness or numbness of the face, arm, or leg, especially on one side of the body.  You have sudden trouble walking or difficulty moving arms or legs.  You have sudden confusion.  You have trouble speaking (aphasia) or understanding.  You have sudden trouble seeing in one or both eyes.  You have a loss of balance or coordination.  You have a sudden, severe headache with no known cause.  You have new chest pain or an irregular heartbeat.  You have a partial or total loss of consciousness. MAKE SURE YOU:   Understand these instructions.  Will watch your condition.  Will get help right away if you are not doing well or get worse. Document Released: 05/04/2005 Document Revised: 07/30/2013 Document Reviewed: 10/30/2013 [facility] Patient Information 2015 [place], [place]. This information is not intended to replace advice given to you by your health care provider. Make sure you discuss any questions you have with your health care provider.

## 2014-11-23 NOTE — ED Notes (Signed)
Onset 2 days ago pt was riding in car, pt had numbness in right arm and right leg.  Lasted 5-10 min.  Pt concerned.  Pt reports tingling in right arm but pt had injury and has intermittant pain and tingling in that arm.  No other s/s noted.

## 2014-12-09 ENCOUNTER — Encounter: Payer: Self-pay | Admitting: Vascular Surgery

## 2014-12-23 ENCOUNTER — Encounter: Payer: Self-pay | Admitting: Vascular Surgery

## 2014-12-25 ENCOUNTER — Ambulatory Visit (INDEPENDENT_AMBULATORY_CARE_PROVIDER_SITE_OTHER): Payer: Self-pay | Admitting: Neurology

## 2014-12-25 ENCOUNTER — Encounter: Payer: Self-pay | Admitting: Neurology

## 2014-12-25 VITALS — BP 170/100 | HR 77 | Ht 70.5 in | Wt 238.5 lb

## 2014-12-25 DIAGNOSIS — I10 Essential (primary) hypertension: Secondary | ICD-10-CM

## 2014-12-25 DIAGNOSIS — E785 Hyperlipidemia, unspecified: Secondary | ICD-10-CM

## 2014-12-25 DIAGNOSIS — G459 Transient cerebral ischemic attack, unspecified: Secondary | ICD-10-CM

## 2014-12-25 DIAGNOSIS — R200 Anesthesia of skin: Secondary | ICD-10-CM

## 2014-12-25 MED ORDER — LISINOPRIL 20 MG PO TABS: 20.0000 mg | ORAL_TABLET | Freq: Every day | ORAL | Status: DC

## 2014-12-25 MED ORDER — LOVASTATIN 20 MG PO TABS: 20.0000 mg | ORAL_TABLET | Freq: Every day | ORAL | Status: DC

## 2014-12-25 NOTE — Progress Notes (Signed)
[facility] Clinic Note - Initial Visit   Date: 12/25/2014   Christopher [name] MRN: [id number] DOB: April 19, 1961   Dear Dr. Juleen China:  Thank you for your kind referral of Christopher [name] for consultation of right sided paresthesias. Although his history is well known to you, please allow [place] to reiterate it for the purpose of our medical record. The patient was accompanied to the clinic by self.    History of Present Illness: Christopher [name] is a 54 y.o. right-handed Caucasian male with untreated hypertension, hyperlipidemia, and prior alcohol/tobacco use (stopped in 2003) presenting for evaluation of transient right sided sensory loss.    On April 15th 2016, he was riding in his truck and developed right acute onset of right arm and leg numbness.  No numbness of the face.  Symptoms lasted less than 5-minutes. No associated weakness, vision changes, or speech disturbance.  Over the next two days, he became more concerned that symptoms may have represented a stroke, so went to the emergency department on 4/17 where he underwent MRI/A brain and MRA neck.  There was no acute stroke, but imaging showed high-grade stenosis of right ICA origin ~70% and moderate stenosis of the P2 segments of the left PCA.  He was started on lisinopril and aspirin  and recommended to follow-up with neurology and vascular specialist.  He is scheduled to see Dr. Gretta Began, vascular specialist, in June.  He denies any recurrent neurological symptoms since April.    Unfortunately, patient has known hypertension and hyperlipidemia but has been off medications for the past two years because of lack of insurance.  He did not take a daily aspirin prior to symptom onset.  No prior history of heart disease or stroke.  His mother has strong history of cardiac disease.    In 2003, he was framing a window and fell off a ladder and severely injured his right arm and forearm.  He underwent  extensive surgery and was able to save his limb.  Since then, he has chronic right arm pain described as stiffness and numbness especially over the last three fingers.  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    Out-side paper records, electronic medical record, and images have been reviewed where available and summarized as:   MRI brain, MRA head, MRA neck wwo contrast 11/23/2014: 1. Atherosclerosis at the right carotid bifurcation resulting in High-grade right ICA origin stenosis (estimated at 70%). 2. Mild atherosclerosis at the left carotid bifurcation without stenosis. 3. Mild tortuosity and stenosis of the left vertebral artery V2 segment. 4. Moderate to severe stenosis of the left PCA P2 segment. 5. Normal intracranial anterior circulation.   Labs 11/23/2014:  Na 135, K 4.8, Chl 102, Cr 0.95, AST 46*, ALT 52, Bili 1.5*   Past Medical History  Diagnosis Date  . Hypertension     Past Surgical History  Procedure Laterality Date  . Arm surgery Right 2003     Medications:  Current Outpatient Prescriptions on File Prior to Visit  Medication Sig Dispense Refill  . lisinopril (PRINIVIL,ZESTRIL) 20 MG tablet Take 1 tablet (20 mg total) by mouth daily. 30 tablet 1   No current facility-administered medications on file prior to visit.    Allergies: No Known Allergies  Family History: Family History  Problem Relation Age of Onset  . Heart disease Mother   . Failure to thrive Father   . Congestive Heart Failure Father   .  Breast cancer Sister     Social History: History   Social History  . Marital Status: Single    Spouse Name: N/A  . Number of Children: N/A  . Years of Education: N/A   Occupational History  . Not on file.   Social History Main Topics  . Smoking status: Former Games developer  . Smokeless tobacco: Never Used  . Alcohol Use: No  . Drug Use: Yes    Special: Marijuana  . Sexual Activity: Not on file   Other  Topics Concern  . Not on file   Social History Narrative   Lives with girlfriend in an apartment on the second floor.  Works as a Teaching laboratory technician.  Has no children.  Education: 10th grade.    Review of Systems:  CONSTITUTIONAL: No fevers, chills, night sweats, or weight loss.   EYES: No visual changes or eye pain ENT: No hearing changes.  No history of nose bleeds.   RESPIRATORY: No cough, wheezing and shortness of breath.   CARDIOVASCULAR: Negative for chest pain, and palpitations.   GI: Negative for abdominal discomfort, blood in stools or black stools.  No recent change in bowel habits.   GU:  No history of incontinence.   MUSCLOSKELETAL: No history of joint pain or swelling.  No myalgias.   SKIN: Negative for lesions, rash, and itching.   HEMATOLOGY/ONCOLOGY: Negative for prolonged bleeding, bruising easily, and swollen nodes.  No history of cancer.   ENDOCRINE: Negative for cold or heat intolerance, polydipsia or goiter.   PSYCH:  No depression +anxiety symptoms.   NEURO: As Above.   Vital Signs:  BP 170/100 mmHg  Pulse 77  Ht 5' 10.5" (1.791 m)  Wt 238 lb 8 oz (108.183 kg)  BMI 33.73 kg/m2  SpO2 97% Pain Scale: 0 on a scale of 0-10   General Medical Exam:   General:  Well appearing, comfortable.   Eyes/ENT: see cranial nerve examination.   Neck: No masses appreciated.  Full range of motion without tenderness.  No carotid bruits. Respiratory:  Clear to auscultation, good air entry bilaterally.   Cardiac:  Regular rate and rhythm, no murmur.   Extremities:  No deformities, edema, or skin discoloration.  Skin:  No rashes or lesions.  Neurological Exam: MENTAL STATUS including orientation to time, place, person, recent and remote memory, attention span and concentration, language, and fund of knowledge is normal.  Speech is not dysarthric.  CRANIAL NERVES: II:  No visual field defects.  Unremarkable fundi.  Visual acuity is 20/20 OD and 20/100 OS  (corrected) III-IV-VI: Pupils equal round and reactive to light.  Normal conjugate, extra-ocular eye movements in all directions of gaze.  No nystagmus.  No ptosis.   V:  Normal facial sensation.  VII:  Normal facial symmetry and movements.  No pathologic facial reflexes.  VIII:  Normal hearing and vestibular function.   IX-X:  Normal palatal movement.   XI:  Normal shoulder shrug and head rotation.   XII:  Normal tongue strength and range of motion, no deviation or fasciculation.  MOTOR:  Right hypothenar atrophy and claw hand deformity. No fasciculations or abnormal movements.  No pronator drift.  Tone is normal.    Right Upper Extremity:    Left Upper Extremity:    Deltoid  5/5   Deltoid  5/5   Biceps  5/5   Biceps  5/5   Triceps  5/5   Triceps  5/5   Wrist extensors  5/5   Wrist  extensors  5/5   Wrist flexors  5/5   Wrist flexors  5/5   Finger extensors  5/5   Finger extensors  5/5   Finger flexors  5/5   Finger flexors  5/5   Dorsal interossei  4+/5   Dorsal interossei  5/5   Abductor pollicis  5/5   Abductor pollicis  5/5   Tone (Ashworth scale)  0  Tone (Ashworth scale)  0   Right Lower Extremity:    Left Lower Extremity:    Hip flexors  5/5   Hip flexors  5/5   Hip extensors  5/5   Hip extensors  5/5   Knee flexors  5/5   Knee flexors  5/5   Knee extensors  5/5   Knee extensors  5/5   Dorsiflexors  5/5   Dorsiflexors  5/5   Plantarflexors  5/5   Plantarflexors  5/5   Toe extensors  5/5   Toe extensors  5/5   Toe flexors  5/5   Toe flexors  5/5   Tone (Ashworth scale)  0  Tone (Ashworth scale)  0   MSRs:  Right                                                                 Left brachioradialis 2+  brachioradialis 2+  biceps 2+  biceps 2+  triceps 2+  triceps 2+  patellar 2+  patellar 2+  ankle jerk 2+  ankle jerk 2+  Hoffman no  Hoffman no  plantar response down  plantar response down   SENSORY: Hyperesthesias to pin prick over the right ulnar distribution (old),  otherwise normal and symmetric perception of light touch, pinprick, vibration, and proprioception.  Romberg's sign absent.   COORDINATION/GAIT: Normal finger-to- nose-finger and heel-to-shin.  Intact rapid alternating movements bilaterally.  Able to rise from a chair without using arms.  Gait narrow based and stable. Tandem and stressed gait intact.    IMPRESSION: Mr. Melvin is a 54 year-old gentleman presenting for evaluation of TIA manifesting with transient right arm and leg paresthesias (April 2016).  Symptoms lasted a less than 5-minutes and have no returned since onset.  There was no associated motor deficits and his face was spared.  His imaging shows RICA stenosis at the origin of ~70% which is asymptomatic.  He also has intracranial atherosclerosis involving the left PCA P2 segment, but this would not be expected to cause sensory deficits (would expect visual field changes with PCA involvevement).  Both areas of stenosis would not results in RIGHT sensory changes.  With his history of untreated hypertension and hyperlipidemia, small vessel disease causing a pure sensory lacunar syndrome is possible.  Alternatively cardioembolic phenomenonon is possible, but throughout to be less likely.  Unfortunately, he does not have health insurance so health expenses need to be kept in mind.  He is looking into obtaining health insurance and prefers to do additional cardiac work-up and labs at a later date.    In the meantime, I will treat him medically for risk factor management (HTN, HPL) and anti-platelet therapy.  No clear history of diabetes, but needs to be investigated.  Patient has been informed of stroke warning signs and instructed to go to the nearest emergency department, if  signs/symptoms develop.    Clinically, he has right hand paresthesias and weakness following the ulnar distribution, which is from his old arm injury.  No new findings on exam.  NIHSS 0/44.   PLAN/RECOMMENDATIONS:  1.   Start lovastatin 20mg  at bedtime  2.  Continue aspirin 325mg  daily and lisinopril 20mg  daily 3.  Check lipid panel, HbA1c, TSH once insurance is established (he is working on it) 4.  Strongly encouraged to establish care with a primary care physician  5.  He is scheduled to see a vascular specialist for RICA stenosis (asymptomatic) 6.  Once you have insurance, additional work-up including cardiac monitoring and echocardiogram is recommended.   7.  Return to clinic 2-3 months or sooner as needed.   The duration of this appointment visit was 40 minutes of face-to-face time with the patient.  Greater than 50% of this time was spent in counseling, explanation of diagnosis, planning of further management, and coordination of care.   Thank you for allowing me to participate in patient's care.  If I can answer any additional questions, I would be pleased to do so.    Sincerely,    Donika K. Allena Katz, DO

## 2014-12-25 NOTE — Patient Instructions (Addendum)
1.  Start lovastatin 20mg  at bedtime  2.  Continue aspirin 325mg  daily and lisinopril 20mg  daily 3.  Please establish care with a primary care physician 4.  Once you have insurance, additional work-up including cardiac monitoring and echocardiogram is recommended.  Please call my office so we can schedule this. 5.  Return to clinic 2-3 months or sooner as needed.

## 2015-01-13 ENCOUNTER — Encounter: Payer: Self-pay | Admitting: Vascular Surgery

## 2015-02-04 ENCOUNTER — Encounter: Payer: Self-pay | Admitting: Vascular Surgery

## 2015-02-10 ENCOUNTER — Encounter: Payer: Self-pay | Admitting: Vascular Surgery

## 2015-03-23 ENCOUNTER — Encounter: Payer: Self-pay | Admitting: Vascular Surgery

## 2015-03-24 ENCOUNTER — Ambulatory Visit (INDEPENDENT_AMBULATORY_CARE_PROVIDER_SITE_OTHER): Payer: Self-pay | Admitting: Vascular Surgery

## 2015-03-24 ENCOUNTER — Encounter: Payer: Self-pay | Admitting: Vascular Surgery

## 2015-03-24 VITALS — BP 167/106 | HR 77 | Temp 97.5°F | Resp 18 | Ht 70.5 in | Wt 242.0 lb

## 2015-03-24 DIAGNOSIS — I6521 Occlusion and stenosis of right carotid artery: Secondary | ICD-10-CM

## 2015-03-24 NOTE — Progress Notes (Signed)
Patient name: Christopher [name] MRN: [id number] DOB: December 01, 1960 Sex: male   Referred by: Emergency department referral  Reason for referral:  Chief Complaint  Patient presents with  . New Evaluation    was seen in ED carotid    HISTORY OF PRESENT ILLNESS: Patient is a 54 year old gentleman seen in April of this year with an transient ischemic attack. He reports that he was driving his car and had an episode of numbness in his right arm and right leg. He reports this only lasted for several seconds. This quickly returned back to his normal baseline. He was concerned about this in approximately 3 days later presented to the emergency department for evaluation. At that point he was completely intact neurologically. He did undergo CT scan which showed no evidence of bleed evidence MRA of his head and neck. MRA of his neck showed greater than 70% stenosis in his right ICA at the bifurcation and no other evidence of extracranial stenosis. He has had no further deficits since this event 4 months ago. He does not have health insurance and is worried about the cost of evaluation and treatment. He does not have any history of cardiac disease. On discussing other peripheral vascular symptoms, he has a very clear bilateral calf claudication. He reports that if he walks a great distance he has to stop because of his calf discomfort and this will resolve after a brief rest. He does not have any tissue loss does not have any rest pain.  Past Medical History  Diagnosis Date  . Hypertension     Past Surgical History  Procedure Laterality Date  . Arm surgery Right 2003  . Eye surgery      Social History   Social History  . Marital Status: Single    Spouse Name: N/A  . Number of Children: N/A  . Years of Education: N/A   Occupational History  . Not on file.   Social History Main Topics  . Smoking status: Former Smoker    Quit date: 03/23/2002  . Smokeless tobacco: Never Used  . Alcohol  Use: No     Comment: Previously drank 24 beer/daily for 27 years, but has been sober since June 2003  . Drug Use: Yes    Special: Marijuana     Comment: Daily   . Sexual Activity: Not on file   Other Topics Concern  . Not on file   Social History Narrative   Lives with girlfriend in an apartment on the second floor.     Works as a Teaching laboratory technician for apartment complex.     Has no children.  Education: 10th grade.    Family History  Problem Relation Age of Onset  . Heart disease Mother   . Failure to thrive Father   . Congestive Heart Failure Father     Deceased  . Breast cancer Sister     Living    Allergies as of 03/24/2015  . (No Known Allergies)    Current Outpatient Prescriptions on File Prior to Visit  Medication Sig Dispense Refill  . aspirin 325 MG tablet Take 325 mg by mouth daily.    Marland Kitchen lisinopril (PRINIVIL,ZESTRIL) 20 MG tablet Take 1 tablet (20 mg total) by mouth daily. 30 tablet 5  . lovastatin (MEVACOR) 20 MG tablet Take 1 tablet (20 mg total) by mouth at bedtime. 30 tablet 5   No current facility-administered medications on file prior to visit.     REVIEW OF  SYSTEMS:  Positives indicated with an "X"  CARDIOVASCULAR:   chest pain    chest pressure    palpitations    orthopnea    dyspnea on exertion   [x ] claudication    rest pain    DVT    phlebitis PULMONARY:    productive cough    asthma    wheezing NEUROLOGIC:    weakness   paresthesias   aphasia   amaurosis   dizziness HEMATOLOGIC:    bleeding problems    clotting disorders MUSCULOSKELETAL:   joint pain    joint swelling GASTROINTESTINAL:   blood in stool    hematemesis GENITOURINARY:    dysuria    hematuria PSYCHIATRIC:   history of major depression INTEGUMENTARY:   rashes   ulcers CONSTITUTIONAL:   fever    chills  PHYSICAL EXAMINATION:  General: The patient is a well-nourished male, in no acute  distress. Vital signs are BP 167/106 mmHg  Pulse 77  Temp(Src) 97.5 F (36.4 C)  Resp 18  Ht 5' 10.5" (1.791 m)  Wt 242 lb (109.77 kg)  BMI 34.22 kg/m2  SpO2 98% Pulmonary: There is a good air exchange bilaterally without wheezing or rales. Abdomen: Soft and non-tender with normal pitch bowel sounds. Musculoskeletal: There are no major deformities.  There is no significant extremity pain. Neurologic: No focal weakness or paresthesias are detected, Skin: There are no ulcer or rashes noted. Psychiatric: The patient has normal affect. Cardiovascular: There is a regular rate and rhythm without significant murmur appreciated. Carotid arteries without bruits bilaterally Pulse status: 2+ radial pulses bilaterally. Markedly diminished femoral and absent popliteal and distal pulses bilaterally    Impression and Plan:  I reviewed his MRA images and report and discussed these at length with the patient. I explained that he certainly did not have any symptoms referable to his right brain. Explained that with his high-grade asymptomatic stenosis of his right internal carotid artery, and he had a approximate 5% per year risk of transient ischemic attack or stroke. Explained the recommendation for elective endarterectomy for reduction of stroke risk. He is asymptomatic from her right carotid system. I also discussed his intermittent claudication. It is not causing any harm by walking and walking program and smoking cessation of the most important elements of treatment. He knows to notify us immediately should he have any new neurologic deficits. Otherwise he wishes to wait to consider surgery until after the first of the year for insurance reasons. I explained that there is a slight risk in this interval for her logic deficit which may be unrecoverable. He understands and wishes to see [place] again in January for continued discussion. He agrees to notify us immediately should he develop any neurologic  deficit.    Gretta Began [facility] Office: [phone]

## 2015-03-24 NOTE — Progress Notes (Signed)
Filed Vitals:   03/24/15 0941 03/24/15 0943 03/24/15 0944  BP: 166/100 165/105 167/106  Pulse: 77 77 77  Temp: 97.5 F (36.4 C)    Resp: 18    Height: 5' 10.5" (1.791 m)    Weight: 242 lb (109.77 kg)    SpO2: 98%

## 2015-08-25 ENCOUNTER — Encounter: Payer: Self-pay | Admitting: Vascular Surgery

## 2015-08-25 ENCOUNTER — Ambulatory Visit: Payer: Self-pay | Admitting: Vascular Surgery

## 2015-09-01 ENCOUNTER — Ambulatory Visit: Payer: Self-pay | Admitting: Vascular Surgery

## 2015-10-07 DIAGNOSIS — I472 Ventricular tachycardia, unspecified: Secondary | ICD-10-CM

## 2015-10-07 DIAGNOSIS — I219 Acute myocardial infarction, unspecified: Secondary | ICD-10-CM

## 2015-10-07 HISTORY — DX: Ventricular tachycardia, unspecified: I47.20

## 2015-10-07 HISTORY — DX: Ventricular tachycardia: I47.2

## 2015-10-07 HISTORY — DX: Acute myocardial infarction, unspecified: I21.9

## 2015-10-13 ENCOUNTER — Ambulatory Visit: Payer: Self-pay | Admitting: Vascular Surgery

## 2015-10-20 ENCOUNTER — Encounter: Payer: Self-pay | Admitting: Emergency Medicine

## 2015-10-20 ENCOUNTER — Ambulatory Visit: Payer: Self-pay | Admitting: Vascular Surgery

## 2015-10-20 ENCOUNTER — Emergency Department: Payer: BLUE CROSS/BLUE SHIELD

## 2015-10-20 ENCOUNTER — Encounter
Admission: EM | Disposition: A | Discharge status: Other Acute Inpatient Hospital | Payer: Self-pay | Source: Home / Self Care | Attending: Emergency Medicine

## 2015-10-20 ENCOUNTER — Encounter (HOSPITAL_COMMUNITY): Payer: Self-pay | Admitting: Cardiovascular Disease

## 2015-10-20 ENCOUNTER — Inpatient Hospital Stay (HOSPITAL_COMMUNITY)
Admission: EM | Admit: 2015-10-20 | Discharge: 2015-11-24 | DRG: 003 | Disposition: A | Discharge status: Rehabilitation Facility | Payer: BLUE CROSS/BLUE SHIELD | Source: Other Acute Inpatient Hospital | Attending: Cardiothoracic Surgery | Admitting: Cardiothoracic Surgery

## 2015-10-20 ENCOUNTER — Encounter (HOSPITAL_COMMUNITY)
Admission: EM | Disposition: A | Discharge status: Rehabilitation Facility | Payer: Self-pay | Source: Other Acute Inpatient Hospital | Attending: Cardiothoracic Surgery

## 2015-10-20 ENCOUNTER — Emergency Department
Admission: EM | Admit: 2015-10-20 | Discharge: 2015-10-20 | Disposition: A | Discharge status: Other Acute Inpatient Hospital | Payer: BLUE CROSS/BLUE SHIELD | Attending: Emergency Medicine | Admitting: Emergency Medicine

## 2015-10-20 DIAGNOSIS — I4892 Unspecified atrial flutter: Secondary | ICD-10-CM | POA: Diagnosis not present

## 2015-10-20 DIAGNOSIS — I130 Hypertensive heart and chronic kidney disease with heart failure and stage 1 through stage 4 chronic kidney disease, or unspecified chronic kidney disease: Secondary | ICD-10-CM | POA: Diagnosis present

## 2015-10-20 DIAGNOSIS — Z789 Other specified health status: Secondary | ICD-10-CM

## 2015-10-20 DIAGNOSIS — I4891 Unspecified atrial fibrillation: Secondary | ICD-10-CM | POA: Diagnosis not present

## 2015-10-20 DIAGNOSIS — I2119 ST elevation (STEMI) myocardial infarction involving other coronary artery of inferior wall: Secondary | ICD-10-CM | POA: Diagnosis not present

## 2015-10-20 DIAGNOSIS — R1110 Vomiting, unspecified: Secondary | ICD-10-CM

## 2015-10-20 DIAGNOSIS — K567 Ileus, unspecified: Secondary | ICD-10-CM | POA: Insufficient documentation

## 2015-10-20 DIAGNOSIS — R5381 Other malaise: Secondary | ICD-10-CM | POA: Diagnosis not present

## 2015-10-20 DIAGNOSIS — Z6835 Body mass index (BMI) 35.0-35.9, adult: Secondary | ICD-10-CM

## 2015-10-20 DIAGNOSIS — J9601 Acute respiratory failure with hypoxia: Secondary | ICD-10-CM

## 2015-10-20 DIAGNOSIS — J810 Acute pulmonary edema: Secondary | ICD-10-CM | POA: Diagnosis not present

## 2015-10-20 DIAGNOSIS — E871 Hypo-osmolality and hyponatremia: Secondary | ICD-10-CM | POA: Diagnosis present

## 2015-10-20 DIAGNOSIS — Z794 Long term (current) use of insulin: Secondary | ICD-10-CM | POA: Diagnosis not present

## 2015-10-20 DIAGNOSIS — R570 Cardiogenic shock: Secondary | ICD-10-CM | POA: Diagnosis not present

## 2015-10-20 DIAGNOSIS — I679 Cerebrovascular disease, unspecified: Secondary | ICD-10-CM | POA: Diagnosis present

## 2015-10-20 DIAGNOSIS — Z9911 Dependence on respirator [ventilator] status: Secondary | ICD-10-CM

## 2015-10-20 DIAGNOSIS — I484 Atypical atrial flutter: Secondary | ICD-10-CM | POA: Diagnosis not present

## 2015-10-20 DIAGNOSIS — Z930 Tracheostomy status: Secondary | ICD-10-CM | POA: Diagnosis not present

## 2015-10-20 DIAGNOSIS — I509 Heart failure, unspecified: Secondary | ICD-10-CM

## 2015-10-20 DIAGNOSIS — N170 Acute kidney failure with tubular necrosis: Secondary | ICD-10-CM | POA: Diagnosis not present

## 2015-10-20 DIAGNOSIS — I255 Ischemic cardiomyopathy: Secondary | ICD-10-CM | POA: Diagnosis present

## 2015-10-20 DIAGNOSIS — D649 Anemia, unspecified: Secondary | ICD-10-CM | POA: Diagnosis present

## 2015-10-20 DIAGNOSIS — Z7982 Long term (current) use of aspirin: Secondary | ICD-10-CM | POA: Insufficient documentation

## 2015-10-20 DIAGNOSIS — J9589 Other postprocedural complications and disorders of respiratory system, not elsewhere classified: Secondary | ICD-10-CM | POA: Diagnosis not present

## 2015-10-20 DIAGNOSIS — T380X5A Adverse effect of glucocorticoids and synthetic analogues, initial encounter: Secondary | ICD-10-CM | POA: Diagnosis present

## 2015-10-20 DIAGNOSIS — I6521 Occlusion and stenosis of right carotid artery: Secondary | ICD-10-CM | POA: Diagnosis present

## 2015-10-20 DIAGNOSIS — Z9889 Other specified postprocedural states: Secondary | ICD-10-CM

## 2015-10-20 DIAGNOSIS — E1151 Type 2 diabetes mellitus with diabetic peripheral angiopathy without gangrene: Secondary | ICD-10-CM | POA: Diagnosis present

## 2015-10-20 DIAGNOSIS — Y848 Other medical procedures as the cause of abnormal reaction of the patient, or of later complication, without mention of misadventure at the time of the procedure: Secondary | ICD-10-CM | POA: Diagnosis not present

## 2015-10-20 DIAGNOSIS — I483 Typical atrial flutter: Secondary | ICD-10-CM | POA: Diagnosis not present

## 2015-10-20 DIAGNOSIS — Z9689 Presence of other specified functional implants: Secondary | ICD-10-CM

## 2015-10-20 DIAGNOSIS — R001 Bradycardia, unspecified: Secondary | ICD-10-CM | POA: Diagnosis not present

## 2015-10-20 DIAGNOSIS — J42 Unspecified chronic bronchitis: Secondary | ICD-10-CM | POA: Diagnosis not present

## 2015-10-20 DIAGNOSIS — Z87891 Personal history of nicotine dependence: Secondary | ICD-10-CM | POA: Insufficient documentation

## 2015-10-20 DIAGNOSIS — J189 Pneumonia, unspecified organism: Secondary | ICD-10-CM | POA: Diagnosis not present

## 2015-10-20 DIAGNOSIS — R600 Localized edema: Secondary | ICD-10-CM | POA: Diagnosis not present

## 2015-10-20 DIAGNOSIS — I25110 Atherosclerotic heart disease of native coronary artery with unstable angina pectoris: Secondary | ICD-10-CM | POA: Diagnosis not present

## 2015-10-20 DIAGNOSIS — F1290 Cannabis use, unspecified, uncomplicated: Secondary | ICD-10-CM | POA: Diagnosis present

## 2015-10-20 DIAGNOSIS — I10 Essential (primary) hypertension: Secondary | ICD-10-CM | POA: Diagnosis present

## 2015-10-20 DIAGNOSIS — J440 Chronic obstructive pulmonary disease with acute lower respiratory infection: Secondary | ICD-10-CM | POA: Diagnosis not present

## 2015-10-20 DIAGNOSIS — I739 Peripheral vascular disease, unspecified: Secondary | ICD-10-CM

## 2015-10-20 DIAGNOSIS — N179 Acute kidney failure, unspecified: Secondary | ICD-10-CM | POA: Insufficient documentation

## 2015-10-20 DIAGNOSIS — T3695XA Adverse effect of unspecified systemic antibiotic, initial encounter: Secondary | ICD-10-CM | POA: Diagnosis not present

## 2015-10-20 DIAGNOSIS — K761 Chronic passive congestion of liver: Secondary | ICD-10-CM | POA: Diagnosis present

## 2015-10-20 DIAGNOSIS — Z978 Presence of other specified devices: Secondary | ICD-10-CM

## 2015-10-20 DIAGNOSIS — J9622 Acute and chronic respiratory failure with hypercapnia: Secondary | ICD-10-CM | POA: Diagnosis not present

## 2015-10-20 DIAGNOSIS — E876 Hypokalemia: Secondary | ICD-10-CM | POA: Diagnosis present

## 2015-10-20 DIAGNOSIS — J95851 Ventilator associated pneumonia: Secondary | ICD-10-CM | POA: Diagnosis not present

## 2015-10-20 DIAGNOSIS — R21 Rash and other nonspecific skin eruption: Secondary | ICD-10-CM | POA: Diagnosis not present

## 2015-10-20 DIAGNOSIS — K5900 Constipation, unspecified: Secondary | ICD-10-CM | POA: Diagnosis not present

## 2015-10-20 DIAGNOSIS — I110 Hypertensive heart disease with heart failure: Secondary | ICD-10-CM | POA: Diagnosis present

## 2015-10-20 DIAGNOSIS — Z8249 Family history of ischemic heart disease and other diseases of the circulatory system: Secondary | ICD-10-CM

## 2015-10-20 DIAGNOSIS — R0682 Tachypnea, not elsewhere classified: Secondary | ICD-10-CM | POA: Diagnosis not present

## 2015-10-20 DIAGNOSIS — I779 Disorder of arteries and arterioles, unspecified: Secondary | ICD-10-CM | POA: Insufficient documentation

## 2015-10-20 DIAGNOSIS — I442 Atrioventricular block, complete: Secondary | ICD-10-CM | POA: Insufficient documentation

## 2015-10-20 DIAGNOSIS — J410 Simple chronic bronchitis: Secondary | ICD-10-CM | POA: Diagnosis not present

## 2015-10-20 DIAGNOSIS — K560 Paralytic ileus: Secondary | ICD-10-CM | POA: Diagnosis not present

## 2015-10-20 DIAGNOSIS — I959 Hypotension, unspecified: Secondary | ICD-10-CM | POA: Diagnosis not present

## 2015-10-20 DIAGNOSIS — I5041 Acute combined systolic (congestive) and diastolic (congestive) heart failure: Secondary | ICD-10-CM | POA: Diagnosis present

## 2015-10-20 DIAGNOSIS — J449 Chronic obstructive pulmonary disease, unspecified: Secondary | ICD-10-CM | POA: Diagnosis not present

## 2015-10-20 DIAGNOSIS — E669 Obesity, unspecified: Secondary | ICD-10-CM | POA: Diagnosis present

## 2015-10-20 DIAGNOSIS — R740 Nonspecific elevation of levels of transaminase and lactic acid dehydrogenase [LDH]: Secondary | ICD-10-CM | POA: Diagnosis not present

## 2015-10-20 DIAGNOSIS — N189 Chronic kidney disease, unspecified: Secondary | ICD-10-CM | POA: Diagnosis present

## 2015-10-20 DIAGNOSIS — J9621 Acute and chronic respiratory failure with hypoxia: Secondary | ICD-10-CM | POA: Diagnosis not present

## 2015-10-20 DIAGNOSIS — R079 Chest pain, unspecified: Secondary | ICD-10-CM | POA: Diagnosis not present

## 2015-10-20 DIAGNOSIS — I4901 Ventricular fibrillation: Secondary | ICD-10-CM | POA: Diagnosis not present

## 2015-10-20 DIAGNOSIS — J9690 Respiratory failure, unspecified, unspecified whether with hypoxia or hypercapnia: Secondary | ICD-10-CM

## 2015-10-20 DIAGNOSIS — I472 Ventricular tachycardia, unspecified: Secondary | ICD-10-CM | POA: Insufficient documentation

## 2015-10-20 DIAGNOSIS — D62 Acute posthemorrhagic anemia: Secondary | ICD-10-CM | POA: Diagnosis not present

## 2015-10-20 DIAGNOSIS — E870 Hyperosmolality and hypernatremia: Secondary | ICD-10-CM | POA: Diagnosis not present

## 2015-10-20 DIAGNOSIS — R0602 Shortness of breath: Secondary | ICD-10-CM

## 2015-10-20 DIAGNOSIS — E785 Hyperlipidemia, unspecified: Secondary | ICD-10-CM | POA: Diagnosis present

## 2015-10-20 DIAGNOSIS — I5021 Acute systolic (congestive) heart failure: Secondary | ICD-10-CM

## 2015-10-20 DIAGNOSIS — E1165 Type 2 diabetes mellitus with hyperglycemia: Secondary | ICD-10-CM | POA: Diagnosis present

## 2015-10-20 DIAGNOSIS — Z09 Encounter for follow-up examination after completed treatment for conditions other than malignant neoplasm: Secondary | ICD-10-CM

## 2015-10-20 DIAGNOSIS — Z8673 Personal history of transient ischemic attack (TIA), and cerebral infarction without residual deficits: Secondary | ICD-10-CM

## 2015-10-20 DIAGNOSIS — I5043 Acute on chronic combined systolic (congestive) and diastolic (congestive) heart failure: Secondary | ICD-10-CM | POA: Diagnosis present

## 2015-10-20 DIAGNOSIS — Z79899 Other long term (current) drug therapy: Secondary | ICD-10-CM | POA: Diagnosis not present

## 2015-10-20 DIAGNOSIS — E869 Volume depletion, unspecified: Secondary | ICD-10-CM | POA: Diagnosis not present

## 2015-10-20 DIAGNOSIS — Y95 Nosocomial condition: Secondary | ICD-10-CM | POA: Diagnosis not present

## 2015-10-20 DIAGNOSIS — Z4659 Encounter for fitting and adjustment of other gastrointestinal appliance and device: Secondary | ICD-10-CM

## 2015-10-20 DIAGNOSIS — J9501 Hemorrhage from tracheostomy stoma: Secondary | ICD-10-CM | POA: Diagnosis not present

## 2015-10-20 DIAGNOSIS — Z951 Presence of aortocoronary bypass graft: Secondary | ICD-10-CM

## 2015-10-20 DIAGNOSIS — K913 Postprocedural intestinal obstruction: Secondary | ICD-10-CM | POA: Diagnosis not present

## 2015-10-20 DIAGNOSIS — Z452 Encounter for adjustment and management of vascular access device: Secondary | ICD-10-CM

## 2015-10-20 DIAGNOSIS — I213 ST elevation (STEMI) myocardial infarction of unspecified site: Secondary | ICD-10-CM | POA: Insufficient documentation

## 2015-10-20 DIAGNOSIS — I469 Cardiac arrest, cause unspecified: Secondary | ICD-10-CM | POA: Diagnosis not present

## 2015-10-20 DIAGNOSIS — Z4509 Encounter for adjustment and management of other cardiac device: Secondary | ICD-10-CM

## 2015-10-20 DIAGNOSIS — Z9289 Personal history of other medical treatment: Secondary | ICD-10-CM

## 2015-10-20 DIAGNOSIS — I2510 Atherosclerotic heart disease of native coronary artery without angina pectoris: Secondary | ICD-10-CM

## 2015-10-20 DIAGNOSIS — J9811 Atelectasis: Secondary | ICD-10-CM

## 2015-10-20 DIAGNOSIS — Z430 Encounter for attention to tracheostomy: Secondary | ICD-10-CM

## 2015-10-20 DIAGNOSIS — R579 Shock, unspecified: Secondary | ICD-10-CM | POA: Diagnosis not present

## 2015-10-20 HISTORY — DX: Occlusion and stenosis of right carotid artery: I65.21

## 2015-10-20 HISTORY — DX: Atrioventricular block, complete: I44.2

## 2015-10-20 HISTORY — PX: CARDIAC CATHETERIZATION: SHX172

## 2015-10-20 HISTORY — DX: Transient cerebral ischemic attack, unspecified: G45.9

## 2015-10-20 LAB — CBC
HCT: 37.4 % — ABNORMAL LOW (ref 40.0–52.0)
Hemoglobin: 13.1 g/dL (ref 13.0–18.0)
MCH: 31.9 pg (ref 26.0–34.0)
MCHC: 35.1 g/dL (ref 32.0–36.0)
MCV: 90.8 fL (ref 80.0–100.0)
Platelets: 185 K/uL (ref 150–440)
RBC: 4.12 MIL/uL — ABNORMAL LOW (ref 4.40–5.90)
RDW: 13.8 % (ref 11.5–14.5)
WBC: 17.4 K/uL — ABNORMAL HIGH (ref 3.8–10.6)

## 2015-10-20 LAB — BASIC METABOLIC PANEL WITH GFR
Anion gap: 11 (ref 5–15)
BUN: 29 mg/dL — ABNORMAL HIGH (ref 6–20)
CO2: 28 mmol/L (ref 22–32)
Calcium: 8.4 mg/dL — ABNORMAL LOW (ref 8.9–10.3)
Chloride: 88 mmol/L — ABNORMAL LOW (ref 101–111)
Creatinine, Ser: 1.14 mg/dL (ref 0.61–1.24)
GFR calc Af Amer: >60 mL/min
GFR calc non Af Amer: >60 mL/min
Glucose, Bld: 114 mg/dL — ABNORMAL HIGH (ref 65–99)
Potassium: 3 mmol/L — ABNORMAL LOW (ref 3.5–5.1)
Sodium: 127 mmol/L — ABNORMAL LOW (ref 135–145)

## 2015-10-20 LAB — BRAIN NATRIURETIC PEPTIDE
B NATRIURETIC PEPTIDE 5: 1396.4 pg/mL — AB (ref 0.0–100.0)
B Natriuretic Peptide: 1390 pg/mL — ABNORMAL HIGH (ref 0.0–100.0)

## 2015-10-20 LAB — TROPONIN I
TROPONIN I: 7.74 ng/mL — AB (ref <0.031)
TROPONIN I: 9.85 ng/mL — AB (ref <0.031)

## 2015-10-20 LAB — PROTIME-INR
INR: 1.24
PROTHROMBIN TIME: 15.8 s — AB (ref 11.4–15.0)

## 2015-10-20 LAB — MRSA PCR SCREENING: MRSA BY PCR: NEGATIVE

## 2015-10-20 LAB — POCT ACTIVATED CLOTTING TIME: ACTIVATED CLOTTING TIME: 204 s

## 2015-10-20 LAB — TSH: TSH: 3.817 u[IU]/mL (ref 0.350–4.500)

## 2015-10-20 SURGERY — LEFT HEART CATH AND CORONARY ANGIOGRAPHY
Anesthesia: LOCAL

## 2015-10-20 MED ORDER — FENTANYL CITRATE (PF) 100 MCG/2ML IJ SOLN
INTRAMUSCULAR | Status: AC
  Filled 2015-10-20: qty 2

## 2015-10-20 MED ORDER — HEPARIN SODIUM (PORCINE) 5000 UNIT/ML IJ SOLN
5000.0000 [IU] | Freq: Once | INTRAMUSCULAR | Status: AC
  Administered 2015-10-20: 5000 [IU] via INTRAVENOUS

## 2015-10-20 MED ORDER — SODIUM CHLORIDE 0.9% FLUSH
3.0000 mL | Freq: Two times a day (BID) | INTRAVENOUS | Status: DC
  Administered 2015-10-20 – 2015-10-21 (×3): 3 mL via INTRAVENOUS

## 2015-10-20 MED ORDER — ACETAMINOPHEN 325 MG PO TABS: 650.0000 mg | ORAL_TABLET | ORAL | Status: DC | PRN

## 2015-10-20 MED ORDER — VERAPAMIL HCL 2.5 MG/ML IV SOLN
INTRAVENOUS | Status: DC | PRN
  Administered 2015-10-20: 19:00:00 via INTRA_ARTERIAL

## 2015-10-20 MED ORDER — HEPARIN (PORCINE) IN NACL 100-0.45 UNIT/ML-% IJ SOLN
2100.0000 [IU]/h | INTRAMUSCULAR | Status: DC
  Administered 2015-10-20: 1300 [IU]/h via INTRAVENOUS
  Administered 2015-10-22: 2200 [IU]/h via INTRAVENOUS
  Administered 2015-10-22 – 2015-10-26 (×7): 2100 [IU]/h via INTRAVENOUS
  Filled 2015-10-20 (×13): qty 250

## 2015-10-20 MED ORDER — FENTANYL CITRATE (PF) 100 MCG/2ML IJ SOLN
INTRAMUSCULAR | Status: DC | PRN
  Administered 2015-10-20: 25 ug via INTRAVENOUS

## 2015-10-20 MED ORDER — FUROSEMIDE 10 MG/ML IJ SOLN
INTRAMUSCULAR | Status: DC | PRN
  Administered 2015-10-20: 40 mg via INTRAVENOUS

## 2015-10-20 MED ORDER — LIDOCAINE HCL (PF) 1 % IJ SOLN
INTRAMUSCULAR | Status: AC
  Filled 2015-10-20: qty 30

## 2015-10-20 MED ORDER — ASPIRIN 325 MG PO TABS
325.0000 mg | ORAL_TABLET | Freq: Every day | ORAL | Status: DC
  Administered 2015-10-21 – 2015-10-26 (×6): 325 mg via ORAL
  Filled 2015-10-20 (×6): qty 1

## 2015-10-20 MED ORDER — HEPARIN (PORCINE) IN NACL 2-0.9 UNIT/ML-% IJ SOLN
INTRAMUSCULAR | Status: DC | PRN
  Administered 2015-10-20: 1000 mL

## 2015-10-20 MED ORDER — HEPARIN SODIUM (PORCINE) 1000 UNIT/ML IJ SOLN
INTRAMUSCULAR | Status: AC
  Filled 2015-10-20: qty 1

## 2015-10-20 MED ORDER — MIDAZOLAM HCL 2 MG/2ML IJ SOLN
INTRAMUSCULAR | Status: AC
  Filled 2015-10-20: qty 2

## 2015-10-20 MED ORDER — FUROSEMIDE 10 MG/ML IJ SOLN
40.0000 mg | Freq: Two times a day (BID) | INTRAMUSCULAR | Status: DC
  Administered 2015-10-21: 40 mg via INTRAVENOUS
  Filled 2015-10-20: qty 4

## 2015-10-20 MED ORDER — NITROGLYCERIN 0.4 MG SL SUBL: 0.4000 mg | SUBLINGUAL_TABLET | SUBLINGUAL | Status: DC | PRN

## 2015-10-20 MED ORDER — MIDAZOLAM HCL 2 MG/2ML IJ SOLN
INTRAMUSCULAR | Status: DC | PRN
  Administered 2015-10-20: 2 mg via INTRAVENOUS

## 2015-10-20 MED ORDER — ASPIRIN 81 MG PO CHEW
324.0000 mg | CHEWABLE_TABLET | Freq: Once | ORAL | Status: AC
  Administered 2015-10-20: 324 mg via ORAL

## 2015-10-20 MED ORDER — LIDOCAINE HCL (PF) 1 % IJ SOLN
INTRAMUSCULAR | Status: DC | PRN
  Administered 2015-10-20: 2 mL

## 2015-10-20 MED ORDER — ATORVASTATIN CALCIUM 80 MG PO TABS
80.0000 mg | ORAL_TABLET | Freq: Every day | ORAL | Status: DC
  Administered 2015-10-20 – 2015-11-23 (×33): 80 mg via ORAL
  Filled 2015-10-20 (×33): qty 1

## 2015-10-20 MED ORDER — ASPIRIN 81 MG PO CHEW
324.0000 mg | CHEWABLE_TABLET | ORAL | Status: AC
  Filled 2015-10-20: qty 4

## 2015-10-20 MED ORDER — SODIUM CHLORIDE 0.9% FLUSH: 3.0000 mL | INTRAVENOUS | Status: DC | PRN

## 2015-10-20 MED ORDER — HEPARIN SODIUM (PORCINE) 1000 UNIT/ML IJ SOLN
INTRAMUSCULAR | Status: DC | PRN
  Administered 2015-10-20: 6000 [IU] via INTRAVENOUS

## 2015-10-20 MED ORDER — ASPIRIN 300 MG RE SUPP
300.0000 mg | RECTAL | Status: AC
Start: 2015-10-20 — End: 2015-10-21

## 2015-10-20 MED ORDER — ASPIRIN 81 MG PO CHEW
CHEWABLE_TABLET | ORAL | Status: AC
  Filled 2015-10-20: qty 1

## 2015-10-20 MED ORDER — ONDANSETRON HCL 4 MG/2ML IJ SOLN: 4.0000 mg | Freq: Four times a day (QID) | INTRAMUSCULAR | Status: DC | PRN

## 2015-10-20 MED ORDER — HEPARIN (PORCINE) IN NACL 2-0.9 UNIT/ML-% IJ SOLN
INTRAMUSCULAR | Status: AC
  Filled 2015-10-20: qty 1000

## 2015-10-20 MED ORDER — ALBUTEROL SULFATE (2.5 MG/3ML) 0.083% IN NEBU: 5.0000 mg | INHALATION_SOLUTION | Freq: Once | RESPIRATORY_TRACT | Status: DC

## 2015-10-20 MED ORDER — SODIUM CHLORIDE 0.9 % IV SOLN
250.0000 mL | INTRAVENOUS | Status: DC | PRN
  Administered 2015-10-27: 15:00:00 via INTRAVENOUS

## 2015-10-20 MED ORDER — IOHEXOL 350 MG/ML SOLN
INTRAVENOUS | Status: DC | PRN
  Administered 2015-10-20: 60 mL via INTRAVENOUS

## 2015-10-20 MED ORDER — VERAPAMIL HCL 2.5 MG/ML IV SOLN
INTRAVENOUS | Status: AC
  Filled 2015-10-20: qty 2

## 2015-10-20 MED ORDER — CETYLPYRIDINIUM CHLORIDE 0.05 % MT LIQD
7.0000 mL | Freq: Two times a day (BID) | OROMUCOSAL | Status: DC
  Administered 2015-10-21 – 2015-10-26 (×10): 7 mL via OROMUCOSAL

## 2015-10-20 MED ORDER — FUROSEMIDE 10 MG/ML IJ SOLN
INTRAMUSCULAR | Status: AC
  Filled 2015-10-20: qty 4

## 2015-10-20 SURGICAL SUPPLY — 14 items
CATH INFINITI 5 FR JL3.5 (CATHETERS) ×2 IMPLANT
CATH INFINITI 5FR ANG PIGTAIL (CATHETERS) ×2 IMPLANT
CATH INFINITI JR4 5F (CATHETERS) ×2 IMPLANT
DEVICE RAD COMP TR BAND LRG (VASCULAR PRODUCTS) ×2 IMPLANT
ELECT DEFIB PAD ADLT CADENCE (PAD) ×2 IMPLANT
GLIDESHEATH SLEND SS 6F .021 (SHEATH) ×2 IMPLANT
HOVERMATT SINGLE USE (MISCELLANEOUS) ×2 IMPLANT
KIT HEART LEFT (KITS) ×2 IMPLANT
PACK CARDIAC CATHETERIZATION (CUSTOM PROCEDURE TRAY) ×2 IMPLANT
SYR MEDRAD MARK V 150ML (SYRINGE) ×2 IMPLANT
TRANSDUCER W/STOPCOCK (MISCELLANEOUS) ×2 IMPLANT
TUBING CIL FLEX 10 FLL-RA (TUBING) ×2 IMPLANT
WIRE HI TORQ VERSACORE-J 145CM (WIRE) ×2 IMPLANT
WIRE SAFE-T 1.5MM-J .035X260CM (WIRE) ×2 IMPLANT

## 2015-10-20 NOTE — H&P (Signed)
History and Physical  Patient ID: Christopher [name] MRN: [id number], SOB: 1961-06-09 55 y.o. Date of Encounter: 10/20/2015, 8:32 PM  Primary Physician: No PCP Per Patient Primary Cardiologist: none  Chief Complaint: shortness of breath  HPI: 55 y.o. male w/ PMHx significant for HTN who presented to [facility] on 10/20/2015 with complaints of shortness of breath. The patient was felt to have a drug reaction several days ago when he developed rash, chest and back pain, and diaphoresis. He was started on prednisone. He apparently had fairly severe "heartburn" last week for a prolonged period of time. This was associated with him feeling generally bad and having symptoms of a flulike illness. He tells me that after he started prednisone he had another episode of heartburn but has had no chest pain or pressure over the last 3 days. He presented today to the hospital because of shortness of breath. He denies edema, orthopnea, or PND. On arrival to the emergency department, he was found to have an inferior STEMI pattern and also he was in complete heart block. A code STEMI was activated and he is transferred to the cardiac catheterization lab emergently. On arrival here he states "I feel fine." He does admit to shortness of breath at rest. He has no past cardiac history. He has not had lightheadedness, presyncope, or frank syncope. His hemodynamics have been stable in route with blood pressures of 120s over 80s. The patient has a strong family history of coronary artery disease as his mother had multivessel CABG in her early 37s. The patient smokes marijuana regularly but does not smoke cigarettes. He quit alcohol many years ago and quit cigarettes over 10 years ago.   Past Medical History  Diagnosis Date  . Hypertension   . TIA (transient ischemic attack)   . Stenosis of right carotid artery   . Complete heart block (HCC) 10/20/2015     Surgical History:  Past Surgical History  Procedure  Laterality Date  . Arm surgery Right 2003  . Eye surgery    . Laceration repair       Home Meds: Prior to Admission medications   Medication Sig Start Date End Date Taking? Authorizing Provider  aspirin 325 MG tablet Take 325 mg by mouth daily.    Historical Provider, MD  lisinopril (PRINIVIL,ZESTRIL) 20 MG tablet Take 1 tablet (20 mg total) by mouth daily. 12/25/14   Donika K Patel, DO  lovastatin (MEVACOR) 20 MG tablet Take 1 tablet (20 mg total) by mouth at bedtime. 12/25/14   Glendale Chard, DO    Allergies:  Allergies  Allergen Reactions  . Losartan Shortness Of Breath  . Lisinopril Rash    Social History   Social History  . Marital Status: Single    Spouse Name: N/A  . Number of Children: N/A  . Years of Education: N/A   Occupational History  . Not on file.   Social History Main Topics  . Smoking status: Former Smoker    Quit date: 03/23/2002  . Smokeless tobacco: Never Used  . 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 Drug Use: Yes    Special: Marijuana     Comment: Daily   . Sexual Activity: Not on file   Other Topics Concern  . Not on file   Social History Narrative   Lives with girlfriend in an apartment on the second floor.  Works as a Teaching laboratory technician for apartment complex.     Has no children.  Education: 10th grade.     Family History  Problem Relation Age of Onset  . Heart disease Mother   . Failure to thrive Father   . Congestive Heart Failure Father     Deceased  . Breast cancer Sister     Living    Review of Systems: See history of present illness. Otherwise negative except as outlined above.   Physical Exam: Height 5\' 10"  (1.778 m), weight 112.1 kg (247 lb 2.2 oz). General: Well developed, well nourished, pleasant obese male,  alert and oriented, in no acute distress. HEENT: Normocephalic, atraumatic, sclera anicteric Neck: Supple. Carotids 2+ without  bruits. JVP normal Lungs: Clear bilaterally to auscultation without wheezes, rales, or rhonchi. Breathing is unlabored. Heart: RRR with normal S1 and S2. No murmurs, rubs, or gallops appreciated. Abdomen: Soft, non-tender, non-distended with normoactive bowel sounds. No hepatomegaly. No rebound/guarding. No obvious abdominal masses. Back: No CVA tenderness Msk:  Strength and tone appear normal for age. Extremities: No clubbing, cyanosis, or edema.  Distal pedal pulses are 2+ and equal bilaterally. Neuro: CNII-XII intact, moves all extremities spontaneously. Psych:  Responds to questions appropriately with a normal affect. Skin: warm and dry without rash   Labs:   Lab Results  Component Value Date   WBC 17.4* 10/20/2015   HGB 13.1 10/20/2015   HCT 37.4* 10/20/2015   MCV 90.8 10/20/2015   PLT 185 10/20/2015    Recent Labs Lab 10/20/15 1745  NA 127*  K 3.0*  CL 88*  CO2 28  BUN 29*  CREATININE 1.14  CALCIUM 8.4*  GLUCOSE 114*    Recent Labs  10/20/15 1745  TROPONINI 7.74*   No results found for: CHOL, HDL, LDLCALC, TRIG No results found for: DDIMER  Radiology/Studies:  Dg Chest Portable 1 View  10/20/2015  CLINICAL DATA:  Shortness of breath. EXAM: PORTABLE CHEST 1 VIEW COMPARISON:  None. FINDINGS: Midline trachea. Cardiomegaly accentuated by AP portable technique. No pleural effusion or pneumothorax. Mildly low lung volumes with bibasilar atelectasis. IMPRESSION: Mild cardiomegaly and low lung volumes.  No acute findings. Electronically Signed   By: Jeronimo Greaves M.D.   On: 10/20/2015 18:09     EKG: Complete heart block with a narrow QRS escape, Inferior STEMI with Q waves present and reciprocal changes noted  CARDIAC STUDIES: pending  ASSESSMENT AND PLAN:  1. Acute inferior STEMI, late clinical presentation with acute infarct likely > 72 hours ago 2. Complete AV block, stable hemodynamics and narrow QRS escape 3. Acute heart failure, likely combination of systolic  and diastolic heart failure 4. Essential HTN 5. Hyperlipidemia 6. Marijuana use  The patient presents now with a late inferior MI, with a prolonged episode of chest pain several days ago now with symptoms of acute heart failure and pulmonary edema on chest x-ray. Urgent cardiac catheterization and possible PCI are indicated. I think in this situation, while it may not be appropriate to open an occluded vessel to infarcted myocardium, it is important to define the patient's coronary anatomy as he is at high risk of complications with heart block and acute CHF. Further plans pending his heart cath results. Emergency implied consent obtained. Procedural risks and indications reviewed and he understands and agrees to proceed.  Enzo Bi MD 10/20/2015, 8:32 PM

## 2015-10-20 NOTE — Progress Notes (Signed)
ANTICOAGULATION CONSULT NOTE - Initial Consult  Pharmacy Consult for heparin Indication: chest pain/ACS s/p cath  Allergies  Allergen Reactions  . Losartan Shortness Of Breath  . Lisinopril Rash    Patient Measurements: Height: 5\' 10"  (177.8 cm) Weight: 247 lb 2.2 oz (112.1 kg) IBW/kg (Calculated) : 73 Heparin Dosing Weight: 97.5 kg  Vital Signs: Temp: 97.7 F (36.5 C) (03/14 1723) Temp Source: Oral (03/14 1723) BP: 125/95 mmHg (03/14 1810) Pulse Rate: 41 (03/14 1817)  Labs:  Recent Labs  10/20/15 1745  HGB 13.1  HCT 37.4*  PLT 185  LABPROT 15.8*  INR 1.24  CREATININE 1.14  TROPONINI 7.74*    Estimated Creatinine Clearance: 93.9 mL/min (by C-G formula based on Cr of 1.14).   Medical History: Past Medical History  Diagnosis Date  . Hypertension   . TIA (transient ischemic attack)   . Stenosis of right carotid artery   . Complete heart block (HCC) 10/20/2015    Medications:  Scheduled:  . aspirin  324 mg Oral NOW   Or  . aspirin  300 mg Rectal NOW  . aspirin  325 mg Oral Daily  . [START ON 10/21/2015] atorvastatin  80 mg Oral q1800  . [START ON 10/21/2015] furosemide  40 mg Intravenous BID  . sodium chloride flush  3 mL Intravenous Q12H   Infusions:    Assessment: 55 yo male with ACS s/p cath will be restarted on heparin 2 hours post TR band removal.  Hgb 13.1 and Plt 185 K.  TR band will be removed at 2230. Goal of Therapy:  Heparin level 0.3-0.7 units/ml Monitor platelets by anticoagulation protocol: Yes   Plan:  - Start heparin at 1300 units/hr at 0030.  No bolus - 6hr heparin level  - daily heparin level and CBC  Beva Remund, Tsz-Yin 10/20/2015,8:28 PM

## 2015-10-20 NOTE — ED Provider Notes (Signed)
[facility] Emergency Department Provider Note   ____________________________________________  Time seen: Approximately 5 45pm I have reviewed the triage vital signs and the triage nursing note.  HISTORY  Chief Complaint Shortness of Breath   Historian Patient and girlfriend  HPI Christopher [name] is a 55 y.o. male who comes to the ED from [facility] after a follow-up appointment for feeling "bad" all week. He reports that it first started about one week ago, last Wednesday when he went to his doctor's office reporting that his new medication losartan was giving him "every side effect on the list. "He states that he had nausea and heartburn and generalized weakness and decreased by mouth intake.  He stopped that medication and started on prednisone. Patient states that over the weekend he really was not improved at all, and felt like the prednisone might even be making him worse. He had had some shortness of breath. No fever. No coughing. He reports mild peripheral edema.  No known prior history of MI.    Past Medical History  Diagnosis Date  . Hypertension   . TIA (transient ischemic attack)   . Stenosis of right carotid artery     Patient Active Problem List   Diagnosis Date Noted  . TIA (transient ischemic attack) 12/25/2014  . Essential hypertension 12/25/2014  . Hyperlipidemia 12/25/2014    Past Surgical History  Procedure Laterality Date  . Arm surgery Right 2003  . Eye surgery    . Laceration repair      Current Outpatient Rx  Name  Route  Sig  Dispense  Refill  . aspirin 325 MG tablet   Oral   Take 325 mg by mouth daily.         Marland Kitchen lisinopril (PRINIVIL,ZESTRIL) 20 MG tablet   Oral   Take 1 tablet (20 mg total) by mouth daily.   30 tablet   5   . lovastatin (MEVACOR) 20 MG tablet   Oral   Take 1 tablet (20 mg total) by mouth at bedtime.   30 tablet   5     Allergies Losartan and Lisinopril  Family History   Problem Relation Age of Onset  . Heart disease Mother   . Failure to thrive Father   . Congestive Heart Failure Father     Deceased  . Breast cancer Sister     Living    Social History Social History  Substance Use Topics  . Smoking status: Former Smoker    Quit date: 03/23/2002  . Smokeless tobacco: Never Used  . Alcohol Use: No     Comment: Previously drank 24 beer/daily for 27 years, but has been sober since June 2003    Review of Systems  Constitutional: Negative for fever. Eyes: Negative for visual changes. ENT: Negative for sore throat. Cardiovascular: Denies palpitations or chest pain per se, but reports indigestion. Respiratory: Positive for shortness of breath. Gastrointestinal: Positive for nausea and vomiting. Genitourinary: Negative for dysuria. Musculoskeletal: Negative for back pain. Skin: Negative for rash. Neurological: Negative for headache. 10 point Review of Systems otherwise negative ____________________________________________   PHYSICAL EXAM:  VITAL SIGNS: ED Triage Vitals  Enc Vitals Group     BP 10/20/15 1723 155/122 mmHg     Pulse Rate 10/20/15 1723 83     Resp 10/20/15 1723 22     Temp 10/20/15 1723 97.7 F (36.5 C)     Temp Source 10/20/15 1723 Oral     SpO2 10/20/15 1723 97 %  Weight 10/20/15 1723 256 lb (116.121 kg)     Height 10/20/15 1723 5' 10.5" (1.791 m)     Head Cir --      Peak Flow --      Pain Score 10/20/15 1724 0     Pain Loc --      Pain Edu? --      Excl. in GC? --      Constitutional: Alert and oriented. Well appearing and in no distress. HEENT   Head: Normocephalic and atraumatic.      Eyes: Conjunctivae are normal. PERRL. Normal extraocular movements.      Ears:         Nose: No congestion/rhinnorhea.   Mouth/Throat: Mucous membranes are moist.   Neck: No stridor. Cardiovascular/Chest: Bradycardic and irregularly irregular.  No murmurs, rubs, or gallops. Respiratory: Normal respiratory effort  without tachypnea nor retractions. Breath sounds are clear and equal bilaterally. No wheezes/rales/rhonchi. Gastrointestinal: Soft. No distention, no guarding, no rebound. Nontender.  Obese  Genitourinary/rectal:Deferred Musculoskeletal: Nontender with normal range of motion in all extremities. No joint effusions.  No lower extremity tenderness.  Trace lower extremity edema. Neurologic:  Normal speech and language. No gross or focal neurologic deficits are appreciated. Skin:  Skin is warm, dry and intact. No rash noted. Psychiatric: Mood and affect are normal. Speech and behavior are normal. Patient exhibits appropriate insight and judgment.  ____________________________________________   EKG I, Governor Rooks, MD, the attending physician have personally viewed and interpreted all ECGs.  #1. 53 bpm.  Complete heart block. Nonspecific intraventricular conduction delay. Normal axis. ST segment elevation in 3, very minimal in aVF. Deep Q-wave in 23 and aVF. ST segment depression in 1 and aVL.  #2 53 bpm. Complete heart block. Nonspecific intraventricular conduction delay. Normal axis. ST segment elevation in leads 3 and aVF with ST segment depression in 1 and aVL. Q waves in II, III, and F aVF ____________________________________________  LABS (pertinent positives/negatives)  Basic metabolic panel significant for sodium 127, potassium 3.0, chloride 88, BUN 29 cranny 1.14 White blood count 17.4, hemoglobin 13.1 and platelet count 185 Troponin 7.74  ____________________________________________  RADIOLOGY All Xrays were viewed by me. Imaging interpreted by Radiologist.  Chest one view protocol: Mild cardiomegaly and low lung volumes. No acute findings __________________________________________  PROCEDURES  Procedure(s) performed: None  Critical Care performed: CRITICAL CARE Performed by: Governor Rooks   Total critical care time: 45 minutes  Critical care time was exclusive of  separately billable procedures and treating other patients.  Critical care was necessary to treat or prevent imminent or life-threatening deterioration.  Critical care was time spent personally by me on the following activities: development of treatment plan with patient and/or surrogate as well as nursing, discussions with consultants, evaluation of patient's response to treatment, examination of patient, obtaining history from patient or surrogate, ordering and performing treatments and interventions, ordering and review of laboratory studies, ordering and review of radiographic studies, pulse oximetry and re-evaluation of patient's condition.   ____________________________________________   ED COURSE / ASSESSMENT AND PLAN  Pertinent labs & imaging results that were available during my care of the patient were reviewed by me and considered in my medical decision making (see chart for details).   Patient arrived with a concerning EKG for ongoing cardiac ischemia. On taking patient's history, he was denying any ongoing chest pain. He denies any change in the last day or really the last 2 hours. He really reports his symptoms have been ongoing for what  sounds like essentially last Wednesday, when he and his physician thought was a side effect to losartan medication and prednisone use.  On looking again on his EKG, was clear that the patient was not in sinus rhythm or second-degree AV block, but in complete heart block. His heart rate is in the 50s to 80s, and his blood pressure is stable with a systolic in the 120s.  Repeat EKG looked like the ST segment elevation in 3 and aVF was little more pronounced, and I did want to transfer the patient to a place where he could have urgent intervention. I spoke with cardiologist Dr. Excell Seltzer at [facility] for a STEMI alert, and patient will be accepted and transferred immediately by ambulance.  I updated the family. He was given aspirin and heparin bolus.  Nitroglycerin was not given on account of no ongoing chest pain, and concern for inducing any hypotension in this patient who would be at risk especially given the complete heart block.    CONSULTATIONS:   Dr. Excell Seltzer, cardiology, [facility], accepted in transfer as STEMI alert.   Patient / Family / Caregiver informed of clinical course, medical decision-making process, and agree with plan.    ___________________________________________   FINAL CLINICAL IMPRESSION(S) / ED DIAGNOSES   Final diagnoses:  Complete heart block (HCC)  ST elevation myocardial infarction (STEMI), unspecified artery (HCC)              Note: This dictation was prepared with Dragon dictation. Any transcriptional errors that result from this process are unintentional   Governor Rooks, MD 10/20/15 [phone]

## 2015-10-20 NOTE — ED Notes (Addendum)
Patient to ER for c/o of shortness of breath. Patient arrives via [facility] from [facility]. Per EMS, vitals stable. Was told at [facility] he had "fluid on his lungs" after CXR done. Patient's lungs clear per [facility] and EMS however. Patient used inhaler at home x1 today prior to EMS arrival with mild relief. Patient just finished 6 day Prednisone taper, but was instructed by pharmacy incorrectly on how to take it (patient took 1 first day, 2 second day, etc.). Patient also states he has had "clogged ears" and "heart burn" since taking Prednisone. Also reports that BP medication was changed at approx same time of start of symptoms (changed from Lisinopril to Losartan d/t rash).

## 2015-10-20 NOTE — Progress Notes (Signed)
CRITICAL VALUE ALERT  Critical value received:  troponin  Date of notification: 10/20/15  Time of notification:  2200  Critical value read back:Yes.    Nurse who received alert:  Alycia Rossetti  MD notified (1st page):  Expected value

## 2015-10-21 ENCOUNTER — Encounter (HOSPITAL_COMMUNITY): Payer: Self-pay | Admitting: Cardiovascular Disease

## 2015-10-21 ENCOUNTER — Inpatient Hospital Stay (HOSPITAL_COMMUNITY): Payer: BLUE CROSS/BLUE SHIELD

## 2015-10-21 DIAGNOSIS — E785 Hyperlipidemia, unspecified: Secondary | ICD-10-CM

## 2015-10-21 DIAGNOSIS — I442 Atrioventricular block, complete: Secondary | ICD-10-CM

## 2015-10-21 DIAGNOSIS — R079 Chest pain, unspecified: Secondary | ICD-10-CM

## 2015-10-21 DIAGNOSIS — I2119 ST elevation (STEMI) myocardial infarction involving other coronary artery of inferior wall: Principal | ICD-10-CM

## 2015-10-21 DIAGNOSIS — I10 Essential (primary) hypertension: Secondary | ICD-10-CM

## 2015-10-21 LAB — HEPARIN LEVEL (UNFRACTIONATED): Heparin Unfractionated: 0.13 IU/mL — ABNORMAL LOW (ref 0.30–0.70)

## 2015-10-21 LAB — BASIC METABOLIC PANEL
ANION GAP: 13 (ref 5–15)
ANION GAP: 15 (ref 5–15)
BUN: 23 mg/dL — ABNORMAL HIGH (ref 6–20)
BUN: 24 mg/dL — ABNORMAL HIGH (ref 6–20)
CHLORIDE: 91 mmol/L — AB (ref 101–111)
CHLORIDE: 91 mmol/L — AB (ref 101–111)
CO2: 27 mmol/L (ref 22–32)
CO2: 27 mmol/L (ref 22–32)
Calcium: 8.3 mg/dL — ABNORMAL LOW (ref 8.9–10.3)
Calcium: 8.5 mg/dL — ABNORMAL LOW (ref 8.9–10.3)
Creatinine, Ser: 1.09 mg/dL (ref 0.61–1.24)
Creatinine, Ser: 1.26 mg/dL — ABNORMAL HIGH (ref 0.61–1.24)
GFR calc non Af Amer: >60 mL/min (ref >60)
GFR calc non Af Amer: >60 mL/min (ref >60)
GLUCOSE: 116 mg/dL — AB (ref 65–99)
GLUCOSE: 125 mg/dL — AB (ref 65–99)
Potassium: 2.8 mmol/L — ABNORMAL LOW (ref 3.5–5.1)
Potassium: 2.9 mmol/L — ABNORMAL LOW (ref 3.5–5.1)
Sodium: 131 mmol/L — ABNORMAL LOW (ref 135–145)
Sodium: 133 mmol/L — ABNORMAL LOW (ref 135–145)

## 2015-10-21 LAB — CBC
HEMATOCRIT: 35.5 % — AB (ref 39.0–52.0)
Hemoglobin: 12.4 g/dL — ABNORMAL LOW (ref 13.0–17.0)
MCH: 31.6 pg (ref 26.0–34.0)
MCHC: 34.9 g/dL (ref 30.0–36.0)
MCV: 90.6 fL (ref 78.0–100.0)
Platelets: 210 10*3/uL (ref 150–400)
RBC: 3.92 MIL/uL — AB (ref 4.22–5.81)
RDW: 14 % (ref 11.5–15.5)
WBC: 16.1 10*3/uL — AB (ref 4.0–10.5)

## 2015-10-21 LAB — TROPONIN I
Troponin I: 8.36 ng/mL (ref <0.031)
Troponin I: 7.56 ng/mL (ref <0.031)

## 2015-10-21 LAB — LIPID PANEL
CHOLESTEROL: 150 mg/dL (ref 0–200)
HDL: 18 mg/dL — AB (ref >40)
LDL Cholesterol: 98 mg/dL (ref 0–99)
TRIGLYCERIDES: 171 mg/dL — AB (ref <150)
Total CHOL/HDL Ratio: 8.3 RATIO
VLDL: 34 mg/dL (ref 0–40)

## 2015-10-21 LAB — ECHOCARDIOGRAM COMPLETE
HEIGHTINCHES: 70 in
Weight: 3911.84 oz

## 2015-10-21 MED ORDER — PERFLUTREN LIPID MICROSPHERE
INTRAVENOUS | Status: AC
  Administered 2015-10-21: 2 mL
  Filled 2015-10-21: qty 10

## 2015-10-21 MED ORDER — POTASSIUM CHLORIDE CRYS ER 20 MEQ PO TBCR
40.0000 meq | EXTENDED_RELEASE_TABLET | ORAL | Status: AC
  Administered 2015-10-21 – 2015-10-22 (×3): 40 meq via ORAL
  Filled 2015-10-21 (×3): qty 2

## 2015-10-21 MED ORDER — POTASSIUM CHLORIDE CRYS ER 20 MEQ PO TBCR
40.0000 meq | EXTENDED_RELEASE_TABLET | ORAL | Status: DC
  Administered 2015-10-21 (×2): 40 meq via ORAL
  Filled 2015-10-21 (×2): qty 2

## 2015-10-21 NOTE — Care Management Note (Signed)
Case Management Note  Patient Details  Name: Christopher [name] MRN: [id number] Date of Birth: 08/06/1961  Subjective/Objective:    Adm w stemi                Action/Plan: lives at home   Expected Discharge Date:                  Expected Discharge Plan:  Home/Self Care  In-House Referral:     Discharge planning Services     Post Acute Care Choice:    Choice offered to:     DME Arranged:    DME Agency:     HH Arranged:    HH Agency:     Status of Service:     Medicare Important Message Given:    Date Medicare IM Given:    Medicare IM give by:    Date Additional Medicare IM Given:    Additional Medicare Important Message give by:     If discussed at Long Length of Stay Meetings, dates discussed:    Additional Comments: ur review done  Hanley Hays, RN 10/21/2015, 9:08 AM

## 2015-10-21 NOTE — Progress Notes (Signed)
Repeat KCl remain low, given the fact his HF symptom largely resolved on exam, will d/c IV lasix and focus on repleting K.  Ramond Dial PA Pager: [phone]

## 2015-10-21 NOTE — Progress Notes (Signed)
ANTICOAGULATION CONSULT NOTE   Pharmacy Consult for heparin Indication: chest pain/ACS s/p cath  Allergies  Allergen Reactions  . Losartan Shortness Of Breath  . Lisinopril Rash    Patient Measurements: Height: 5\' 10"  (177.8 cm) Weight: 244 lb 7.8 oz (110.9 kg) IBW/kg (Calculated) : 73 Heparin Dosing Weight: 97.5 kg  Vital Signs: Temp: 98.6 F (37 C) (03/15 0800) Temp Source: Oral (03/15 0800) BP: 127/90 mmHg (03/15 0800) Pulse Rate: 69 (03/15 0800)  Labs:  Recent Labs  10/20/15 1745 10/20/15 2040 10/21/15 0154 10/21/15 0717  HGB 13.1  --   --  12.4*  HCT 37.4*  --   --  35.5*  PLT 185  --   --  210  LABPROT 15.8*  --   --   --   INR 1.24  --   --   --   HEPARINUNFRC  --   --   --  <0.10*  CREATININE 1.14  --   --   --   TROPONINI 7.74* 9.85* 8.36*  --     Estimated Creatinine Clearance: 93.5 mL/min (by C-G formula based on Cr of 1.14).   Medical History: Past Medical History  Diagnosis Date  . Hypertension   . TIA (transient ischemic attack)   . Stenosis of right carotid artery   . Complete heart block (HCC) 10/20/2015    Medications:  Scheduled:  . antiseptic oral rinse  7 mL Mouth Rinse BID  . aspirin  324 mg Oral NOW   Or  . aspirin  300 mg Rectal NOW  . aspirin  325 mg Oral Daily  . atorvastatin  80 mg Oral q1800  . furosemide  40 mg Intravenous BID  . sodium chloride flush  3 mL Intravenous Q12H   Infusions:  . heparin 1,300 Units/hr (10/21/15 0800)    Assessment: 55 yo male with ACS s/p cath with further intervention needed. HL undetectable this morning, cbc stable with no bleeding issues noted.  Goal of Therapy:  Heparin level 0.3-0.7 units/ml Monitor platelets by anticoagulation protocol: Yes   Plan:  - Increase heparin to 1600 units/hr - 6hr heparin level  - daily heparin level and CBC - Continue to follow cardiac plan  Sheppard Coil PharmD., BCPS Clinical Pharmacist Pager [phone] 10/21/2015 8:11 AM

## 2015-10-21 NOTE — Progress Notes (Signed)
  Echocardiogram 2D Echocardiogram has been performed.  Christopher [name] 10/21/2015, 1:43 PM

## 2015-10-21 NOTE — Progress Notes (Signed)
ANTICOAGULATION CONSULT NOTE - Follow Up Consult  Pharmacy Consult for heparin Indication: chest pain/ACS s/p cath  Allergies  Allergen Reactions  . Losartan Shortness Of Breath  . Lisinopril Rash    Patient Measurements: Height: 5\' 10"  (177.8 cm) Weight: 244 lb 7.8 oz (110.9 kg) IBW/kg (Calculated) : 73 Heparin Dosing Weight: 97.5 kg  Vital Signs: Temp: 97.8 F (36.6 C) (03/15 1600) Temp Source: Oral (03/15 1600) BP: 119/95 mmHg (03/15 1600) Pulse Rate: 72 (03/15 1600)  Labs:  Recent Labs  10/20/15 1745 10/20/15 2040 10/21/15 0154 10/21/15 0717 10/21/15 1555  HGB 13.1  --   --  12.4*  --   HCT 37.4*  --   --  35.5*  --   PLT 185  --   --  210  --   LABPROT 15.8*  --   --   --   --   INR 1.24  --   --   --   --   HEPARINUNFRC  --   --   --  <0.10* 0.13*  CREATININE 1.14  --   --  1.09  --   TROPONINI 7.74* 9.85* 8.36* 7.56*  --     Estimated Creatinine Clearance: 97.8 mL/min (by C-G formula based on Cr of 1.09).   Medications:  Scheduled:  . antiseptic oral rinse  7 mL Mouth Rinse BID  . aspirin  324 mg Oral NOW   Or  . aspirin  300 mg Rectal NOW  . aspirin  325 mg Oral Daily  . atorvastatin  80 mg Oral q1800  . furosemide  40 mg Intravenous BID  . potassium chloride  40 mEq Oral Q4H  . sodium chloride flush  3 mL Intravenous Q12H   Infusions:  . heparin 1,600 Units/hr (10/21/15 1100)    Assessment: 55 yo male with chest pain s/p cath is currently on subtherapeutic heparin.  Heparin level is 0.13  Goal of Therapy:  Heparin level 0.3-0.7 units/ml Monitor platelets by anticoagulation protocol: Yes   Plan:  - increase heparin drip to 1900 units/hr.  - 6hr heparin level  Arline Ketter, Tsz-Yin 10/21/2015,5:15 PM

## 2015-10-21 NOTE — Progress Notes (Addendum)
Patient Name: Christopher [name] Date of Encounter: 10/21/2015  Primary Cardiologist: Dr. Excell Seltzer   Principal Problem:   Acute MI, inferior wall, initial episode of care [facility]) Active Problems:   Essential hypertension   Hyperlipidemia   Acute combined systolic and diastolic heart failure (HCC)   Complete heart block (HCC)    SUBJECTIVE  Denies any CP or SOB. Last episode of chest pain last week. He initially thought it was a reaction to the new BP medication he started  CURRENT MEDS . antiseptic oral rinse  7 mL Mouth Rinse BID  . aspirin  324 mg Oral NOW   Or  . aspirin  300 mg Rectal NOW  . aspirin  325 mg Oral Daily  . atorvastatin  80 mg Oral q1800  . furosemide  40 mg Intravenous BID  . potassium chloride  40 mEq Oral Q4H  . sodium chloride flush  3 mL Intravenous Q12H    OBJECTIVE  Filed Vitals:   10/21/15 1200 10/21/15 1300 10/21/15 1400 10/21/15 1500  BP: 116/77 118/87 122/84 107/69  Pulse: 70 66 70 67  Temp:      TempSrc:      Resp: Height:      Weight:      SpO2: 95% 93% 95% 98%    Intake/Output Summary (Last 24 hours) at 10/21/15 1610 Last data filed at 10/21/15 1500  Gross per 24 hour  Intake 1287.95 ml  Output   2325 ml  Net -1037.05 ml   Filed Weights   10/20/15 2000 10/21/15 0300  Weight: 247 lb 2.2 oz (112.1 kg) 244 lb 7.8 oz (110.9 kg)    PHYSICAL EXAM  General: Pleasant, NAD. Neuro: Alert and oriented X 3. Moves all extremities spontaneously. Psych: Normal affect. HEENT:  Normal  Neck: Supple without bruits or JVD. Lungs:  Resp regular and unlabored, CTA. Heart: RRR no s3, s4, or murmurs. Abdomen: Soft, non-tender, non-distended, BS + x 4.  Extremities: No clubbing, cyanosis. DP/PT/Radials 2+ and equal bilaterally. Trace LE edema  Accessory Clinical Findings  CBC  Recent Labs  10/20/15 1745 10/21/15 0717  WBC 17.4* 16.1*  HGB 13.1 12.4*  HCT 37.4* 35.5*  MCV 90.8 90.6  PLT 185 210   Basic Metabolic  Panel  Recent Labs  16/10/96 1745 10/21/15 0717  NA 127* 131*  K 3.0* 2.8*  CL 88* 91*  CO2 28 27  GLUCOSE 114* 116*  BUN 29* 23*  CREATININE 1.14 1.09  CALCIUM 8.4* 8.3*   Cardiac Enzymes  Recent Labs  10/20/15 2040 10/21/15 0154 10/21/15 0717  TROPONINI 9.85* 8.36* 7.56*   Fasting Lipid Panel  Recent Labs  10/21/15 0154  CHOL 150  HDL 18*  LDLCALC 98  TRIG 045*  CHOLHDL 8.3   Thyroid Function Tests  Recent Labs  10/20/15 2040  TSH 3.817    TELE NSR with frequent episode of CHB    ECG  NSR with ST elevation in inferior leads  Echocardiogram  Pending echo    Radiology/Studies  Portable Chest X-ray 1 View  10/21/2015  CLINICAL DATA:  55 year old male with a history of acute systolic congestive heart failure EXAM: PORTABLE CHEST 1 VIEW COMPARISON:  10/20/2015 FINDINGS: Heart size unchanged, enlarged. Similar appearance of low lung volumes with interstitial opacities, interlobular septal thickening, and developing airspace disease of the bilateral lower lungs. No pneumothorax. Linear opacity overlying the mediastinum and the heart border, of on certain significance. Favored to overlie the patient.  IMPRESSION: Evidence of worsening congestive heart failure, with increasing edema. New linear opacity overlies the mediastinum, favored to be overlying the patient, though cannot be localized on this view. Signed, Yvone Neu. Loreta Ave, DO [facility] Radiology Electronically Signed   By: Gilmer Mor D.O.   On: 10/21/2015 07:16   Dg Chest Portable 1 View  10/20/2015  CLINICAL DATA:  Shortness of breath. EXAM: PORTABLE CHEST 1 VIEW COMPARISON:  None. FINDINGS: Midline trachea. Cardiomegaly accentuated by AP portable technique. No pleural effusion or pneumothorax. Mildly low lung volumes with bibasilar atelectasis. IMPRESSION: Mild cardiomegaly and low lung volumes.  No acute findings. Electronically Signed   By: Jeronimo Greaves M.D.   On: 10/20/2015 18:09    ASSESSMENT AND PLAN  1. Late presenting inferior STEMI  - per pt, onset > 72 hour prior to arrival, trop downtrending since arrival, unclear why EKG still shows persistent ST elevation in inferior leads  - 100% prox RCA lesion, 95% prox LAD lesion, 70% ramus lesion, inferior wall akinesis.   - patient denies any chest pain in the last few days, last episode of chest pain was last week. Will discuss with MD regarding LIMA to LAD grafting vs PCI of LAD.   - pending echo today  2. Intermittent complete heart block: likely related to inferior STEMI, may require pacemaker  - no BB or rate control med  3. CAD with 95% LAD residual: see #1  4. Acute systolic HF  - resolving with IV lasix. Despite CXR this morning showing worsening HF, exam now shows lung essentially clear, only trace edema in LE.  5. Severe hypokalemia: repleted with PO potassium, recheck BMET  6. HLD: Chol 150 Trig 171, HDL 18, LDL 98. On statin  Leonides Schanz Amedeo Plenty Pager: [phone]   Attending Note:   The patient was seen and examined.  Agree with assessment and plan as noted above.  Changes made to the above note as needed.  1. CAD :   Has occluded RCA .  Will get an MRI to assess whether or not this was a transmural MI.   He has some collateral flow to the distal RCA Has a very tight LAD and ramus .   Will need either PCI or CABG. I have personally reviewed The angiogram.  THe LAD appears to be very complex and he also has several small diagonals that have severe disease   If he has some signficant degree of viable muscle in the inferior wall, we may consider CABG Have personally reviewed angiograms and echocardiogram    2. Transient AV block Related to the Inf. MI.   Should improve.   He has very brief episodes - he should be able to go through theMRI scanner without difficulities.    Vesta Mixer, Montez Hageman., MD, [facility] 10/21/2015, 4:36 PM [address],  [address] Office [phone] Pager [phone]

## 2015-10-22 ENCOUNTER — Inpatient Hospital Stay (HOSPITAL_COMMUNITY): Payer: BLUE CROSS/BLUE SHIELD

## 2015-10-22 ENCOUNTER — Other Ambulatory Visit: Payer: Self-pay | Admitting: *Deleted

## 2015-10-22 DIAGNOSIS — I2510 Atherosclerotic heart disease of native coronary artery without angina pectoris: Secondary | ICD-10-CM

## 2015-10-22 DIAGNOSIS — I25110 Atherosclerotic heart disease of native coronary artery with unstable angina pectoris: Secondary | ICD-10-CM

## 2015-10-22 LAB — CBC
HCT: 41.1 % (ref 39.0–52.0)
Hemoglobin: 13.3 g/dL (ref 13.0–17.0)
MCH: 29.4 pg (ref 26.0–34.0)
MCHC: 32.4 g/dL (ref 30.0–36.0)
MCV: 90.7 fL (ref 78.0–100.0)
PLATELETS: 128 10*3/uL — AB (ref 150–400)
RBC: 4.53 MIL/uL (ref 4.22–5.81)
RDW: 14.1 % (ref 11.5–15.5)
WBC: 9.3 10*3/uL (ref 4.0–10.5)

## 2015-10-22 LAB — BASIC METABOLIC PANEL
ANION GAP: 12 (ref 5–15)
BUN: 21 mg/dL — ABNORMAL HIGH (ref 6–20)
CO2: 25 mmol/L (ref 22–32)
Calcium: 8.2 mg/dL — ABNORMAL LOW (ref 8.9–10.3)
Chloride: 95 mmol/L — ABNORMAL LOW (ref 101–111)
Creatinine, Ser: 1.12 mg/dL (ref 0.61–1.24)
GFR calc Af Amer: >60 mL/min (ref >60)
GFR calc non Af Amer: >60 mL/min (ref >60)
GLUCOSE: 94 mg/dL (ref 65–99)
Potassium: 4.2 mmol/L (ref 3.5–5.1)
SODIUM: 132 mmol/L — AB (ref 135–145)

## 2015-10-22 LAB — HEPARIN LEVEL (UNFRACTIONATED)
HEPARIN UNFRACTIONATED: 0.7 [IU]/mL (ref 0.30–0.70)
HEPARIN UNFRACTIONATED: 0.13 [IU]/mL — AB (ref 0.30–0.70)
HEPARIN UNFRACTIONATED: 0.35 [IU]/mL (ref 0.30–0.70)

## 2015-10-22 LAB — HEMOGLOBIN A1C
Hgb A1c MFr Bld: 6.3 % — ABNORMAL HIGH (ref 4.8–5.6)
Mean Plasma Glucose: 134 mg/dL

## 2015-10-22 MED ORDER — ~~LOC~~ CARDIAC SURGERY, PATIENT & FAMILY EDUCATION
Freq: Once | Status: AC
  Administered 2015-10-22: 22:00:00
  Filled 2015-10-22: qty 1

## 2015-10-22 NOTE — Progress Notes (Signed)
ANTICOAGULATION CONSULT NOTE - Follow Up Consult  Pharmacy Consult for heparin Indication: CAD   Labs:  Recent Labs  10/20/15 1745 10/20/15 2040 10/21/15 0154  10/21/15 0717 10/21/15 1555 10/22/15 0008 10/22/15 0300  HGB 13.1  --   --   --  12.4*  --   --  13.3  HCT 37.4*  --   --   --  35.5*  --   --  41.1  PLT 185  --   --   --  210  --   --  128*  LABPROT 15.8*  --   --   --   --   --   --   --   INR 1.24  --   --   --   --   --   --   --   HEPARINUNFRC  --   --   --   < > <0.10* 0.13* 0.35 0.13*  CREATININE 1.14  --   --   --  1.09 1.26*  --  1.12  TROPONINI 7.74* 9.85* 8.36*  --  7.56*  --   --   --   < > = values in this interval not displayed.    Assessment: 55yo male now subtherapeutic on heparin after one level at goal; RN reports no gtt issues.  Goal of Therapy:  Heparin level 0.3-0.7 units/ml   Plan:  Will increase heparin gtt by 3 units/kg/hr to 2200 units/hr and check level in 6hr.  Vernard Gambles, PharmD, BCPS  10/22/2015,4:07 AM

## 2015-10-22 NOTE — Consult Note (Signed)
[address]       [place] [address]             [id number]        Christopher [name] [facility] Medical Record #[id number] Date of Birth: 1961/02/14  Referring: Tonny Bollman M.D.  Primary Care: No PCP Per Patient  Chief Complaint:   Chest pain  History of Present Illness:     Patient examined, coronary angiograms and 2-D echocardiogram images personally reviewed. 55 year old obese Caucasian male hypertensive reformed smoker presents with shortness of breath and chest tightness and positive cardiac enzymes. He was admitted and found to have a completed DMI with proximal occlusion of the RCA. He also had significant disease of the LAD and circumflex systems with low ejection fraction by echo. He developed pulmonary edema and was in the CCU following catheterization. He is now on the telemetry unit and feels better. He attempted a trach MRI to assess myocardial viability but had tightness in his neck and could not tolerate the study.  The patient has been recommended for CABG after being evaluated by the interventional cardiology team. He is stable on IV heparin.    Current Activity/ Functional Status:    Zubrod Score: At the time of surgery this patient's most appropriate activity status/level should be described as: []     0    Normal activity, no symptoms []     1    Restricted in physical strenuous activity but ambulatory, able to do out light work [x]     2    Ambulatory and capable of self care, unable to do work activities, up and about                 more than 50%  Of the time                            []     3    Only limited self care, in bed greater than 50% of waking hours []     4    Completely disabled, no self care, confined to bed or chair []     5    Moribund  Past Medical History  Diagnosis Date  . Hypertension   . TIA (transient ischemic attack)   . Stenosis of right carotid artery   . Complete heart block (HCC) 10/20/2015    Past  Surgical History  Procedure Laterality Date  . Arm surgery Right 2003  . Eye surgery    . Laceration repair    . Cardiac catheterization N/A 10/20/2015    Procedure: Left Heart Cath and Coronary Angiography;  Surgeon: Tonny Bollman, MD;  Location: [facility] INVASIVE CV LAB;  Service: Cardiovascular;  Laterality: N/A;    History  Smoking status  . Former Smoker  . Quit date: 03/23/2002  Smokeless tobacco  . 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 Marital Status: Single    Spouse Name: N/A  . Number of Children: N/A  . Years of Education: N/A   Occupational History  . Not on file.   Social History Main Topics  . Smoking status: Former Smoker    Quit date: 03/23/2002  . Smokeless tobacco: Never Used  . 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 Drug Use: Yes    Special: Marijuana     Comment: Daily   . Sexual Activity: Not on file   Other Topics Concern  . Not on file   Social History Narrative   Lives with girlfriend in an apartment on the second floor.     Works as a Teaching laboratory technician for apartment complex.     Has no children.  Education: 10th grade.    Allergies  Allergen Reactions  . Losartan Shortness Of Breath  . Lisinopril Rash    Current Facility-Administered Medications  Medication Dose Route Frequency Provider Last Rate Last Dose  . 0.9 %  sodium chloride infusion  250 mL Intravenous PRN Tonny Bollman, MD      . acetaminophen (TYLENOL) tablet 650 mg  650 mg Oral Q4H PRN Tonny Bollman, MD      . antiseptic oral rinse (CPC / CETYLPYRIDINIUM CHLORIDE 0.05%) solution 7 mL  7 mL Mouth Rinse BID Tonny Bollman, MD   7 mL at 10/22/15 1000  . aspirin tablet 325 mg  325 mg Oral Daily Tonny Bollman, MD   325 mg at 10/22/15 0908  . atorvastatin (LIPITOR) tablet  80 mg  80 mg Oral q1800 Tonny Bollman, MD   80 mg at 10/22/15 1730  . heparin ADULT infusion 100 units/mL (25000 units/250 mL)  2,100 Units/hr Intravenous Continuous Earnie Larsson, RPH 21 mL/hr at 10/22/15 1846 2,100 Units/hr at 10/22/15 1846  . nitroGLYCERIN (NITROSTAT) SL tablet 0.4 mg  0.4 mg Sublingual Q5 Min x 3 PRN Tonny Bollman, MD      . ondansetron [facility]) injection 4 mg  4 mg Intravenous Q6H PRN Tonny Bollman, MD        Prescriptions prior to admission  Medication Sig Dispense Refill Last Dose  . albuterol (PROAIR HFA) 108 (90 Base) MCG/ACT inhaler Inhale 2 puffs into the lungs every 6 (six) hours as needed. Shortness of breath   10/20/2015 at Unknown time  . aspirin 325 MG tablet Take 325 mg by mouth daily.   Past Week at Unknown time  . lisinopril (PRINIVIL,ZESTRIL) 20 MG tablet Take 1 tablet (20 mg total) by mouth daily. 30 tablet 5 Past Week at Unknown time  . lovastatin (MEVACOR) 20 MG tablet Take 1 tablet (20 mg total) by mouth at bedtime. 30 tablet 5 Past Week at Unknown time  . predniSONE (DELTASONE) 10 MG tablet Take 1 tablet by mouth taper from 4 doses each day to 1 dose and stop.   Past Week at Unknown time    Family History  Problem Relation Age of Onset  . Heart disease Mother   . Failure to thrive Father   . Congestive Heart Failure Father     Deceased  . Breast cancer Sister     Living     Review of Systems:       Cardiac Review of Systems: Y or N  Chest Pain [    ]  Resting SOB [   ] Exertional SOB  [Yes yes  ]  Orthopnea [  ]   Pedal Edema [   ]    Palpitations [  ] Syncope  [  ]   Presyncope [   ]  General Review of Systems: [Y] = yes [  ]=no Constitional: recent weight change [  ]; anorexia [  ]; fatigue [  ]; nausea [  ];  night sweats [  ]; fever [  ]; or chills [  ]                                                               Dental: poor dentition[  ]; Last Dentist visit: Greater than 6 months  Eye : blurred vision [  ]; diplopia [   ]; vision  changes [  ];  Amaurosis fugax[  ]; Resp: cough [ yes ];  wheezing[  ];  hemoptysis[  ]; shortness of breath[ yes ]; paroxysmal nocturnal dyspnea[  ]; dyspnea on exertion[  ]; or orthopnea[  ];  GI:  gallstones[  ], vomiting[  ];  dysphagia[  ]; melena[  ];  hematochezia [  ]; heartburn[  ];   Hx of  Colonoscopy[  ]; GU: kidney stones [  ]; hematuria[  ];   dysuria [  ];  nocturia[  ];  history of     obstruction [  ]; urinary frequency [  ]             Skin: rash, swelling[ yes-recent "allergic reaction" ];, hair loss[  ];  peripheral edema[  ];  or itching[  ]; Musculosketetal: myalgias[  ];  joint swelling[  ];  joint erythema[  ];  joint pain[  ];  back pain[  ];  Heme/Lymph: bruising[  ];  bleeding[  ];  anemia[  ];  Neuro: TIA[  ];  headaches[  ];  stroke[  ];  vertigo[  ];  seizures[  ];   paresthesias[  ];  difficulty walking[  ];  Psych:depression[  ]; anxiety[  ];  Endocrine: diabetes[  ];  thyroid dysfunction[  ];  Immunizations: Flu [  ]; Pneumococcal[  ];  Other: Right-hand dominant, no previous thoracic trauma  Physical Exam: BP 129/87 mmHg  Pulse 65  Temp(Src) 98 F (36.7 C) (Oral)  Resp 18  Ht  (1.778 m)  Wt 247 lb 12.8 oz (112.4 kg)  BMI 35.56 kg/m2  SpO2 98%       Physical Exam  General: Middle-aged obese Caucasian male frustrated but no acute distress HEENT: Normocephalic pupils equal , dentition adequate Neck: Supple without JVD, adenopathy, or bruit Chest: Basilar rales right greater than left, , no rhonchi, no tenderness             or deformity Cardiovascular: Regular rate and rhythm, no murmur, no gallop, peripheral pulses             palpable in all extremities Abdomen:  Soft, nontender, no palpable mass or organomegaly Extremities: Warm, well-perfused, no clubbing cyanosis edema or tenderness,              no venous stasis changes of the legs Rectal/GU: Deferred Neuro: Grossly non--focal and symmetrical throughout Skin: Clean and dry without  rash or ulceration   Diagnostic Studies & Laboratory data:     Recent Radiology Findings:   Portable Chest X-ray 1 View  10/21/2015  CLINICAL DATA:  55 year old male with a history of acute systolic congestive heart failure EXAM: PORTABLE CHEST 1 VIEW COMPARISON:  10/20/2015 FINDINGS: Heart size unchanged, enlarged. Similar appearance of low lung volumes with interstitial opacities, interlobular septal thickening, and developing airspace disease of the bilateral lower lungs. No  pneumothorax. Linear opacity overlying the mediastinum and the heart border, of on certain significance. Favored to overlie the patient. IMPRESSION: Evidence of worsening congestive heart failure, with increasing edema. New linear opacity overlies the mediastinum, favored to be overlying the patient, though cannot be localized on this view. Signed, Yvone Neu. Loreta Ave, DO [facility] Radiology Electronically Signed   By: Gilmer Mor D.O.   On: 10/21/2015 07:16     I have independently reviewed the above radiologic studies.  Recent Lab Findings: Lab Results  Component Value Date   WBC 9.3 10/22/2015   HGB 13.3 10/22/2015   HCT 41.1 10/22/2015   PLT 128* 10/22/2015   GLUCOSE 94 10/22/2015   CHOL 150 10/21/2015   TRIG 171* 10/21/2015   HDL 18* 10/21/2015   LDLCALC 98 10/21/2015   ALT 52 11/23/2014   AST 46* 11/23/2014   NA 132* 10/22/2015   K 4.2 10/22/2015   CL 95* 10/22/2015   CREATININE 1.12 10/22/2015   BUN 21* 10/22/2015   CO2 25 10/22/2015   TSH 3.817 10/20/2015   INR 1.24 10/20/2015   HGBA1C 6.3* 10/20/2015      Assessment / Plan:     Severe three-vessel coronary disease, not candidate for PCI, suboptimal targets for CABG but this would be his best long-term therapeutic option  Post DMI acute systolic heart failure with pulmonary edema, low ejection fraction. Right heart cath not performed. LVEDP not available. Would continue daily Lasix  After he  recovers from his DMI and pulmonary status improves agree with CABG during this hospitalization,  at increased risk due to suboptimal targets and reduced LV function-- next week.     @ME1 @ 10/22/2015 7:21 PM

## 2015-10-22 NOTE — Progress Notes (Signed)
ANTICOAGULATION CONSULT NOTE - Follow Up Consult  Pharmacy Consult for heparin Indication: CAD   Labs:  Recent Labs  10/20/15 1745 10/20/15 2040 10/21/15 0154 10/21/15 0717 10/21/15 1555 10/22/15 0008  HGB 13.1  --   --  12.4*  --   --   HCT 37.4*  --   --  35.5*  --   --   PLT 185  --   --  210  --   --   LABPROT 15.8*  --   --   --   --   --   INR 1.24  --   --   --   --   --   HEPARINUNFRC  --   --   --  <0.10* 0.13* 0.35  CREATININE 1.14  --   --  1.09 1.26*  --   TROPONINI 7.74* 9.85* 8.36* 7.56*  --   --      Assessment/Plan:  54yo male therapeutic on heparin after rate increase. Will continue gtt at current rate and confirm stable with additional level.   Vernard Gambles, PharmD, BCPS  10/22/2015,1:15 AM

## 2015-10-22 NOTE — Progress Notes (Signed)
ANTICOAGULATION CONSULT NOTE   Pharmacy Consult for heparin Indication: chest pain/ACS s/p cath  Allergies  Allergen Reactions  . Losartan Shortness Of Breath  . Lisinopril Rash    Patient Measurements: Height: 5\' 10"  (177.8 cm) Weight: 247 lb 12.8 oz (112.4 kg) IBW/kg (Calculated) : 73 Heparin Dosing Weight: 97.5 kg  Vital Signs: Temp: 97.5 F (36.4 C) (03/16 1215) Temp Source: Oral (03/16 1215) BP: 105/78 mmHg (03/16 1215) Pulse Rate: 65 (03/16 1215)  Labs:  Recent Labs  10/20/15 1745 10/20/15 2040 10/21/15 0154  10/21/15 0717 10/21/15 1555 10/22/15 0008 10/22/15 0300 10/22/15 1026  HGB 13.1  --   --   --  12.4*  --   --  13.3  --   HCT 37.4*  --   --   --  35.5*  --   --  41.1  --   PLT 185  --   --   --  210  --   --  128*  --   LABPROT 15.8*  --   --   --   --   --   --   --   --   INR 1.24  --   --   --   --   --   --   --   --   HEPARINUNFRC  --   --   --   < > <0.10* 0.13* 0.35 0.13* 0.70  CREATININE 1.14  --   --   --  1.09 1.26*  --  1.12  --   TROPONINI 7.74* 9.85* 8.36*  --  7.56*  --   --   --   --   < > = values in this interval not displayed.  Estimated Creatinine Clearance: 95.8 mL/min (by C-G formula based on Cr of 1.12).   Medical History: Past Medical History  Diagnosis Date  . Hypertension   . TIA (transient ischemic attack)   . Stenosis of right carotid artery   . Complete heart block (HCC) 10/20/2015    Medications:  Scheduled:  . antiseptic oral rinse  7 mL Mouth Rinse BID  . aspirin  325 mg Oral Daily  . atorvastatin  80 mg Oral q1800  . sodium chloride flush  3 mL Intravenous Q12H   Infusions:  . heparin 2,200 Units/hr (10/22/15 1031)    Assessment: 55 yo male with ACS s/p cath with further intervention needed. Awaiting cardiac MRI to determine surgery viability. HL (0.7) this am at upper limit of goal, cbc stable with no bleeding issues noted.  Goal of Therapy:  Heparin level 0.3-0.7 units/ml Monitor platelets by  anticoagulation protocol: Yes   Plan:  - Decrease heparin to 2100 units/hr - Daily heparin level and CBC - Continue to follow cardiac plan  Sheppard Coil PharmD., BCPS Clinical Pharmacist Pager [phone] 10/22/2015 12:51 PM

## 2015-10-22 NOTE — Progress Notes (Signed)
Subjective:  No CP/SOB, POD # 2 Inf STEMI Rx medically because of late presentation, residual CAD with LVD, intermittent HB.  Objective:  Temp:  [97.5 F (36.4 C)-98.6 F (37 C)] 97.7 F (36.5 C) (03/16 0740) Pulse Rate:  [62-72] 63 (03/16 0600) Resp:  [10-24] 16 (03/16 0600) BP: (90-123)/(64-97) 96/73 mmHg (03/16 0600) SpO2:  [93 %-100 %] 99 % (03/16 0600) Weight:  [247 lb 12.8 oz (112.4 kg)] 247 lb 12.8 oz (112.4 kg) (03/16 0414) Weight change: 10.6 oz (0.3 kg)  Intake/Output from previous day: 03/15 0701 - 03/16 0700 In: 1967.9 [P.O.:1560; I.V.:407.9] Out: 1650 [Urine:1650]  Intake/Output from this shift:    Physical Exam: General appearance: alert and no distress Neck: no adenopathy, no carotid bruit, no JVD, supple, symmetrical, trachea midline and thyroid not enlarged, symmetric, no tenderness/mass/nodules Lungs: clear to auscultation bilaterally Heart: regular rate and rhythm, S1, S2 normal, no murmur, click, rub or gallop Extremities: extremities normal, atraumatic, no cyanosis or edema  Lab Results: Results for orders placed or performed during the hospital encounter of 10/20/15 (from the past 48 hour(s))  POCT Activated clotting time     Status: None   Collection Time: 10/20/15  7:43 PM  Result Value Ref Range   Activated Clotting Time 204 seconds  MRSA PCR Screening     Status: None   Collection Time: 10/20/15  8:21 PM  Result Value Ref Range   MRSA by PCR NEGATIVE NEGATIVE    Comment:        The GeneXpert MRSA Assay (FDA approved for NASAL specimens only), is one component of a comprehensive MRSA colonization surveillance program. It is not intended to diagnose MRSA infection nor to guide or monitor treatment for MRSA infections.   Brain natriuretic peptide     Status: Abnormal   Collection Time: 10/20/15  8:30 PM  Result Value Ref Range   B Natriuretic Peptide 1396.4 (H) 0.0 - 100.0 pg/mL  Hemoglobin A1c     Status: Abnormal   Collection  Time: 10/20/15  8:40 PM  Result Value Ref Range   Hgb A1c MFr Bld 6.3 (H) 4.8 - 5.6 %    Comment: (NOTE)         Pre-diabetes: 5.7 - 6.4         Diabetes: >6.4         Glycemic control for adults with diabetes: <7.0    Mean Plasma Glucose 134 mg/dL    Comment: (NOTE) Performed At: [facility] [address] [place], [place] [id number] Lindon Romp MD [id number]   Troponin I     Status: Abnormal   Collection Time: 10/20/15  8:40 PM  Result Value Ref Range   Troponin I 9.85 (HH) <0.031 ng/mL    Comment:        POSSIBLE MYOCARDIAL ISCHEMIA. SERIAL TESTING RECOMMENDED. CRITICAL RESULT CALLED TO, READ BACK BY AND VERIFIED WITH: DUNDRUM,R RN 10/20/2015 2150 JORDANS   TSH     Status: None   Collection Time: 10/20/15  8:40 PM  Result Value Ref Range   TSH 3.817 0.350 - 4.500 uIU/mL  Troponin I     Status: Abnormal   Collection Time: 10/21/15  1:54 AM  Result Value Ref Range   Troponin I 8.36 (HH) <0.031 ng/mL    Comment:        POSSIBLE MYOCARDIAL ISCHEMIA. SERIAL TESTING RECOMMENDED. CRITICAL VALUE NOTED.  VALUE IS CONSISTENT WITH PREVIOUSLY REPORTED AND CALLED VALUE.   Lipid panel  Status: Abnormal   Collection Time: 10/21/15  1:54 AM  Result Value Ref Range   Cholesterol 150 0 - 200 mg/dL   Triglycerides 171 (H) <150 mg/dL   HDL 18 (L) >40 mg/dL   Total CHOL/HDL Ratio 8.3 RATIO   VLDL 34 0 - 40 mg/dL   LDL Cholesterol 98 0 - 99 mg/dL    Comment:        Total Cholesterol/HDL:CHD Risk Coronary Heart Disease Risk Table                     Men   Women  1/2 Average Risk   3.4   3.3  Average Risk       5.0   4.4  2 X Average Risk   9.6   7.1  3 X Average Risk  23.4   11.0        Use the calculated Patient Ratio above and the CHD Risk Table to determine the patient's CHD Risk.        ATP III CLASSIFICATION (LDL):  <100     mg/dL   Optimal  100-129  mg/dL   Near or Above                    Optimal  130-159  mg/dL   Borderline  160-189  mg/dL    High  >190     mg/dL   Very High   Troponin I     Status: Abnormal   Collection Time: 10/21/15  7:17 AM  Result Value Ref Range   Troponin I 7.56 (HH) <0.031 ng/mL    Comment:        POSSIBLE MYOCARDIAL ISCHEMIA. SERIAL TESTING RECOMMENDED. CRITICAL VALUE NOTED.  VALUE IS CONSISTENT WITH PREVIOUSLY REPORTED AND CALLED VALUE.   Basic metabolic panel     Status: Abnormal   Collection Time: 10/21/15  7:17 AM  Result Value Ref Range   Sodium 131 (L) 135 - 145 mmol/L   Potassium 2.8 (L) 3.5 - 5.1 mmol/L   Chloride 91 (L) 101 - 111 mmol/L   CO2 27 22 - 32 mmol/L   Glucose, Bld 116 (H) 65 - 99 mg/dL   BUN 23 (H) 6 - 20 mg/dL   Creatinine, Ser 1.09 0.61 - 1.24 mg/dL   Calcium 8.3 (L) 8.9 - 10.3 mg/dL   GFR calc non Af Amer >60 >60 mL/min   GFR calc Af Amer >60 >60 mL/min    Comment: (NOTE) The eGFR has been calculated using the CKD EPI equation. This calculation has not been validated in all clinical situations. eGFR's persistently <60 mL/min signify possible Chronic Kidney Disease.    Anion gap 13 5 - 15  Heparin level (unfractionated)     Status: Abnormal   Collection Time: 10/21/15  7:17 AM  Result Value Ref Range   Heparin Unfractionated <0.10 (L) 0.30 - 0.70 IU/mL    Comment:        IF HEPARIN RESULTS ARE BELOW EXPECTED VALUES, AND PATIENT DOSAGE HAS BEEN CONFIRMED, SUGGEST FOLLOW UP TESTING OF ANTITHROMBIN III LEVELS. REPEATED TO VERIFY   CBC     Status: Abnormal   Collection Time: 10/21/15  7:17 AM  Result Value Ref Range   WBC 16.1 (H) 4.0 - 10.5 K/uL   RBC 3.92 (L) 4.22 - 5.81 MIL/uL   Hemoglobin 12.4 (L) 13.0 - 17.0 g/dL   HCT 35.5 (L) 39.0 - 52.0 %   MCV 90.6 78.0 - 100.0  fL   MCH 31.6 26.0 - 34.0 pg   MCHC 34.9 30.0 - 36.0 g/dL   RDW 14.0 11.5 - 15.5 %   Platelets 210 150 - 400 K/uL  Heparin level (unfractionated)     Status: Abnormal   Collection Time: 10/21/15  3:55 PM  Result Value Ref Range   Heparin Unfractionated 0.13 (L) 0.30 - 0.70 IU/mL     Comment:        IF HEPARIN RESULTS ARE BELOW EXPECTED VALUES, AND PATIENT DOSAGE HAS BEEN CONFIRMED, SUGGEST FOLLOW UP TESTING OF ANTITHROMBIN III LEVELS.   Basic metabolic panel     Status: Abnormal   Collection Time: 10/21/15  3:55 PM  Result Value Ref Range   Sodium 133 (L) 135 - 145 mmol/L   Potassium 2.9 (L) 3.5 - 5.1 mmol/L   Chloride 91 (L) 101 - 111 mmol/L   CO2 27 22 - 32 mmol/L   Glucose, Bld 125 (H) 65 - 99 mg/dL   BUN 24 (H) 6 - 20 mg/dL   Creatinine, Ser 1.26 (H) 0.61 - 1.24 mg/dL   Calcium 8.5 (L) 8.9 - 10.3 mg/dL   GFR calc non Af Amer >60 >60 mL/min   GFR calc Af Amer >60 >60 mL/min    Comment: (NOTE) The eGFR has been calculated using the CKD EPI equation. This calculation has not been validated in all clinical situations. eGFR's persistently <60 mL/min signify possible Chronic Kidney Disease.    Anion gap 15 5 - 15  Heparin level (unfractionated)     Status: None   Collection Time: 10/22/15 12:08 AM  Result Value Ref Range   Heparin Unfractionated 0.35 0.30 - 0.70 IU/mL    Comment:        IF HEPARIN RESULTS ARE BELOW EXPECTED VALUES, AND PATIENT DOSAGE HAS BEEN CONFIRMED, SUGGEST FOLLOW UP TESTING OF ANTITHROMBIN III LEVELS.   Heparin level (unfractionated)     Status: Abnormal   Collection Time: 10/22/15  3:00 AM  Result Value Ref Range   Heparin Unfractionated 0.13 (L) 0.30 - 0.70 IU/mL    Comment:        IF HEPARIN RESULTS ARE BELOW EXPECTED VALUES, AND PATIENT DOSAGE HAS BEEN CONFIRMED, SUGGEST FOLLOW UP TESTING OF ANTITHROMBIN III LEVELS.   CBC     Status: Abnormal   Collection Time: 10/22/15  3:00 AM  Result Value Ref Range   WBC 9.3 4.0 - 10.5 K/uL   RBC 4.53 4.22 - 5.81 MIL/uL   Hemoglobin 13.3 13.0 - 17.0 g/dL   HCT 41.1 39.0 - 52.0 %   MCV 90.7 78.0 - 100.0 fL   MCH 29.4 26.0 - 34.0 pg   MCHC 32.4 30.0 - 36.0 g/dL   RDW 14.1 11.5 - 15.5 %   Platelets 128 (L) 150 - 400 K/uL  Basic metabolic panel     Status: Abnormal    Collection Time: 10/22/15  3:00 AM  Result Value Ref Range   Sodium 132 (L) 135 - 145 mmol/L   Potassium 4.2 3.5 - 5.1 mmol/L    Comment: DELTA CHECK NOTED   Chloride 95 (L) 101 - 111 mmol/L   CO2 25 22 - 32 mmol/L   Glucose, Bld 94 65 - 99 mg/dL   BUN 21 (H) 6 - 20 mg/dL   Creatinine, Ser 1.12 0.61 - 1.24 mg/dL   Calcium 8.2 (L) 8.9 - 10.3 mg/dL   GFR calc non Af Amer >60 >60 mL/min   GFR calc Af Amer >60 >  60 mL/min    Comment: (NOTE) The eGFR has been calculated using the CKD EPI equation. This calculation has not been validated in all clinical situations. eGFR's persistently <60 mL/min signify possible Chronic Kidney Disease.    Anion gap 12 5 - 15    Imaging: Imaging results have been reviewed  Tele- NSR with occasional HB  Assessment/Plan:   1. Principal Problem: 2.   Acute MI, inferior wall, initial episode of care ([place]) 3. Active Problems: 4.   Essential hypertension 5.   Hyperlipidemia 6.   Acute combined systolic and diastolic heart failure ([place]) 7.   Complete heart block ([place]) 8.   Time Spent Directly with Patient:  20 minutes  Length of Stay:  LOS: 2 days   POD # 2 Inf STEMI (late presentation) with cath showing occluded Dom RCA prox with faint R--->> R and L--->> R collaterals. Severe prox LAD/Diag bifurcation disease as well as segmental mid LAD disease and RI disease. Mod LV dysfunction. No CP/SOB. Diuresed. K repleted and > 4. Not on BB or anti- platelet Rx. Exam benign. For cardiac MRI viability study today. The LAD disease is complex and difficult for PCI. I feel best option is CABG for complete revascularization. I have called TCTS. Pt remains on IV hep.  Quay Burow 10/22/2015, 9:18 AM

## 2015-10-22 NOTE — Progress Notes (Signed)
Cardiac MRI attempted.  Pt barely fit into the scanner due to size.  Only after a few moments pt said he had trouble breathing and felt like someone was choking him.  Upon bringing him out of the scanner, pt was very diaphoretic, despite the fact that he had no sheet or blanket and the fans were blowing.  Questioned patient and he was uncomfortable and did not want the exam to continue at this time.  Pt taken out of the scanner, and sent back to the floor.

## 2015-10-23 ENCOUNTER — Inpatient Hospital Stay (HOSPITAL_COMMUNITY): Payer: BLUE CROSS/BLUE SHIELD

## 2015-10-23 DIAGNOSIS — I6521 Occlusion and stenosis of right carotid artery: Secondary | ICD-10-CM

## 2015-10-23 DIAGNOSIS — I2510 Atherosclerotic heart disease of native coronary artery without angina pectoris: Secondary | ICD-10-CM

## 2015-10-23 LAB — COMPREHENSIVE METABOLIC PANEL
ALT: 100 U/L — ABNORMAL HIGH (ref 17–63)
AST: 43 U/L — ABNORMAL HIGH (ref 15–41)
Albumin: 3 g/dL — ABNORMAL LOW (ref 3.5–5.0)
Alkaline Phosphatase: 63 U/L (ref 38–126)
Anion gap: 12 (ref 5–15)
BUN: 20 mg/dL (ref 6–20)
CO2: 23 mmol/L (ref 22–32)
Calcium: 8.5 mg/dL — ABNORMAL LOW (ref 8.9–10.3)
Chloride: 98 mmol/L — ABNORMAL LOW (ref 101–111)
Creatinine, Ser: 1.11 mg/dL (ref 0.61–1.24)
GFR calc Af Amer: >60 mL/min (ref >60)
GFR calc non Af Amer: >60 mL/min (ref >60)
Glucose, Bld: 106 mg/dL — ABNORMAL HIGH (ref 65–99)
Potassium: 3.9 mmol/L (ref 3.5–5.1)
Sodium: 133 mmol/L — ABNORMAL LOW (ref 135–145)
Total Bilirubin: 0.8 mg/dL (ref 0.3–1.2)
Total Protein: 5.8 g/dL — ABNORMAL LOW (ref 6.5–8.1)

## 2015-10-23 LAB — URINALYSIS, ROUTINE W REFLEX MICROSCOPIC
Bilirubin Urine: NEGATIVE
Glucose, UA: NEGATIVE mg/dL
Hgb urine dipstick: NEGATIVE
Ketones, ur: NEGATIVE mg/dL
Leukocytes, UA: NEGATIVE
Nitrite: NEGATIVE
Protein, ur: NEGATIVE mg/dL
Specific Gravity, Urine: 1.015 (ref 1.005–1.030)
pH: 5 (ref 5.0–8.0)

## 2015-10-23 LAB — BLOOD GAS, ARTERIAL
Acid-Base Excess: 1.6 mmol/L (ref 0.0–2.0)
Bicarbonate: 24.4 mEq/L — ABNORMAL HIGH (ref 20.0–24.0)
Drawn by: 290171
FIO2: 0.21
O2 Saturation: 93.2 %
Patient temperature: 98.6
TCO2: 25.4 mmol/L (ref 0–100)
pCO2 arterial: 30.8 mmHg — ABNORMAL LOW (ref 35.0–45.0)
pH, Arterial: 7.511 — ABNORMAL HIGH (ref 7.350–7.450)
pO2, Arterial: 66.6 mmHg — ABNORMAL LOW (ref 80.0–100.0)

## 2015-10-23 LAB — CBC
HCT: 34.5 % — ABNORMAL LOW (ref 39.0–52.0)
HEMOGLOBIN: 11.4 g/dL — AB (ref 13.0–17.0)
MCH: 30.6 pg (ref 26.0–34.0)
MCHC: 33 g/dL (ref 30.0–36.0)
MCV: 92.5 fL (ref 78.0–100.0)
Platelets: 192 10*3/uL (ref 150–400)
RBC: 3.73 MIL/uL — ABNORMAL LOW (ref 4.22–5.81)
RDW: 14.4 % (ref 11.5–15.5)
WBC: 11.2 10*3/uL — ABNORMAL HIGH (ref 4.0–10.5)

## 2015-10-23 LAB — GLUCOSE, CAPILLARY: GLUCOSE-CAPILLARY: 110 mg/dL — AB (ref 65–99)

## 2015-10-23 LAB — HEPARIN LEVEL (UNFRACTIONATED): Heparin Unfractionated: 0.38 IU/mL (ref 0.30–0.70)

## 2015-10-23 NOTE — Clinical Documentation Improvement (Signed)
Cardiology  Abnormal Lab/Test Results:  10/20/15: Na= 127; 10/21/15: Na= 131, 133; 10/22/15: Na= 132; 10/23/15: Na= 133  Possible Clinical Conditions associated with below indicators  Hyponatremia  Other Condition  Cannot Clinically Determine    Please exercise your independent, professional judgment when responding. A specific answer is not anticipated or expected.   Thank You,  Cherylann Ratel, RN, BSN Health Information Management [place] [phone]

## 2015-10-23 NOTE — Progress Notes (Signed)
VASCULAR LAB PRELIMINARY  PRELIMINARY  PRELIMINARY  PRELIMINARY  Pre-op Cardiac Surgery  Carotid Findings:   Right:  Greater than 80% internal carotid artery stenosis.  Left:  1-39% ICA stenosis.  Bilateral:  Vertebral artery flow is antegrade.      Upper Extremity Right Left  Brachial Pressures 124  Triphasic  127  Triphasic   Radial Waveforms Triphasic  Triphasic   Ulnar Waveforms Triphasic  Triphasic   Palmar Arch (Allen's Test) Obliterates with both radial and ulnar compression. Obliterates with radial compression, normal with ulnar compression    Lower  Extremity Right Left  Dorsalis Pedis 76  Monophasic 64  Monophasic   Anterior Tibial    Posterior Tibial 82  Monophasic  76  Monophasic   Ankle/Brachial Indices 0.65 Moderate 0.6  Moderate    Christopher [name], RVT 10/23/2015, 12:08 PM

## 2015-10-23 NOTE — Progress Notes (Signed)
ANTICOAGULATION CONSULT NOTE - Follow Up Consult  Pharmacy Consult for Heparin Indication: CAD  Allergies  Allergen Reactions  . Losartan Shortness Of Breath  . Lisinopril Rash    Patient Measurements: Height: 5\' 10"  (177.8 cm) Weight: 250 lb 11.2 oz (113.717 kg) IBW/kg (Calculated) : 73 Heparin Dosing Weight: 97.5 kg  Vital Signs: Temp: 98.2 F (36.8 C) (03/17 0134) Temp Source: Oral (03/17 0134) BP: 114/70 mmHg (03/17 0134) Pulse Rate: 70 (03/17 0134)  Labs:  Recent Labs  10/20/15 1745 10/20/15 2040 10/21/15 0154  10/21/15 0717 10/21/15 1555  10/22/15 0300 10/22/15 1026 10/23/15 0420  HGB 13.1  --   --   --  12.4*  --   --  13.3  --  11.4*  HCT 37.4*  --   --   --  35.5*  --   --  41.1  --  34.5*  PLT 185  --   --   --  210  --   --  128*  --  192  LABPROT 15.8*  --   --   --   --   --   --   --   --   --   INR 1.24  --   --   --   --   --   --   --   --   --   HEPARINUNFRC  --   --   --   < > <0.10* 0.13*  < > 0.13* 0.70 0.38  CREATININE 1.14  --   --   --  1.09 1.26*  --  1.12  --  1.11  TROPONINI 7.74* 9.85* 8.36*  --  7.56*  --   --   --   --   --   < > = values in this interval not displayed.  Estimated Creatinine Clearance: 97.2 mL/min (by C-G formula based on Cr of 1.11).   Assessment:  Anticoagulation:heparin for ACS. HL 0.38 in goal. Hgb 12.4>13.3>1.4, Plts 210>128?>192.  Cardiovascular:HTN, CHB, Late presentation MI, totaled RCA which was not reopened, LAD/Cfx stenosis that will likely need graft or pci. High LVEDP on cath, will diurese and stabilize HF prior to any procedures. Did not tolerate cardiac MRI. EF during cath estimated at 40-45%, CVTS: suboptimal targets for CABG but this would be his best long-term therapeutic option Meds: -asa, Lipitor80, heparin (no BB due to heart block, home lisinopril on hold)  Goal of Therapy:  Heparin level 0.3-0.7 units/ml Monitor platelets by anticoagulation protocol: Yes   Plan:  Continue IV heparin at  2100 units/hr Daily HL/CBC    Christopher [name], PharmD, [facility] Clinical Staff Pharmacist Pager [phone]  Christopher [name] 10/23/2015,8:50 AM

## 2015-10-23 NOTE — Progress Notes (Signed)
Subjective:  No CP/ SOB Day #3 Inf STEMI with cath showing Tot RCA L--> R collat, LAD/Diag and RI Dz, mod LV dysfunction. For CABG Tues  Objective:  Temp:  [98 F (36.7 C)-98.2 F (36.8 C)] 98.1 F (36.7 C) (03/17 1419) Pulse Rate:  [67-77] 67 (03/17 1419) Resp:  [18] 18 (03/17 1419) BP: (108-129)/(70-87) 108/76 mmHg (03/17 1419) SpO2:  [95 %-98 %] 97 % (03/17 1419) Weight:  [250 lb 11.2 oz (113.717 kg)] 250 lb 11.2 oz (113.717 kg) (03/17 0134) Weight change: 2 lb 14.5 oz (1.317 kg)  Intake/Output from previous day: 03/16 0701 - 03/17 0700 In: 731 [P.O.:600; I.V.:131] Out: 1300 [Urine:1300]  Intake/Output from this shift: Total I/O In: 480 [P.O.:480] Out: -   Physical Exam: General appearance: alert and no distress Neck: no adenopathy, no carotid bruit, no JVD, supple, symmetrical, trachea midline and thyroid not enlarged, symmetric, no tenderness/mass/nodules Lungs: clear to auscultation bilaterally Heart: regular rate and rhythm, S1, S2 normal, no murmur, click, rub or gallop Extremities: extremities normal, atraumatic, no cyanosis or edema  Lab Results: Results for orders placed or performed during the hospital encounter of 10/20/15 (from the past 48 hour(s))  Heparin level (unfractionated)     Status: Abnormal   Collection Time: 10/21/15  3:55 PM  Result Value Ref Range   Heparin Unfractionated 0.13 (L) 0.30 - 0.70 IU/mL    Comment:        IF HEPARIN RESULTS ARE BELOW EXPECTED VALUES, AND PATIENT DOSAGE HAS BEEN CONFIRMED, SUGGEST FOLLOW UP TESTING OF ANTITHROMBIN III LEVELS.   Basic metabolic panel     Status: Abnormal   Collection Time: 10/21/15  3:55 PM  Result Value Ref Range   Sodium 133 (L) 135 - 145 mmol/L   Potassium 2.9 (L) 3.5 - 5.1 mmol/L   Chloride 91 (L) 101 - 111 mmol/L   CO2 27 22 - 32 mmol/L   Glucose, Bld 125 (H) 65 - 99 mg/dL   BUN 24 (H) 6 - 20 mg/dL   Creatinine, Ser 1.26 (H) 0.61 - 1.24 mg/dL   Calcium 8.5 (L) 8.9 - 10.3 mg/dL     GFR calc non Af Amer >60 >60 mL/min   GFR calc Af Amer >60 >60 mL/min    Comment: (NOTE) The eGFR has been calculated using the CKD EPI equation. This calculation has not been validated in all clinical situations. eGFR's persistently <60 mL/min signify possible Chronic Kidney Disease.    Anion gap 15 5 - 15  Heparin level (unfractionated)     Status: None   Collection Time: 10/22/15 12:08 AM  Result Value Ref Range   Heparin Unfractionated 0.35 0.30 - 0.70 IU/mL    Comment:        IF HEPARIN RESULTS ARE BELOW EXPECTED VALUES, AND PATIENT DOSAGE HAS BEEN CONFIRMED, SUGGEST FOLLOW UP TESTING OF ANTITHROMBIN III LEVELS.   Heparin level (unfractionated)     Status: Abnormal   Collection Time: 10/22/15  3:00 AM  Result Value Ref Range   Heparin Unfractionated 0.13 (L) 0.30 - 0.70 IU/mL    Comment:        IF HEPARIN RESULTS ARE BELOW EXPECTED VALUES, AND PATIENT DOSAGE HAS BEEN CONFIRMED, SUGGEST FOLLOW UP TESTING OF ANTITHROMBIN III LEVELS.   CBC     Status: Abnormal   Collection Time: 10/22/15  3:00 AM  Result Value Ref Range   WBC 9.3 4.0 - 10.5 K/uL   RBC 4.53 4.22 - 5.81 MIL/uL  Hemoglobin 13.3 13.0 - 17.0 g/dL   HCT 41.1 39.0 - 52.0 %   MCV 90.7 78.0 - 100.0 fL   MCH 29.4 26.0 - 34.0 pg   MCHC 32.4 30.0 - 36.0 g/dL   RDW 14.1 11.5 - 15.5 %   Platelets 128 (L) 150 - 400 K/uL  Basic metabolic panel     Status: Abnormal   Collection Time: 10/22/15  3:00 AM  Result Value Ref Range   Sodium 132 (L) 135 - 145 mmol/L   Potassium 4.2 3.5 - 5.1 mmol/L    Comment: DELTA CHECK NOTED   Chloride 95 (L) 101 - 111 mmol/L   CO2 25 22 - 32 mmol/L   Glucose, Bld 94 65 - 99 mg/dL   BUN 21 (H) 6 - 20 mg/dL   Creatinine, Ser 1.12 0.61 - 1.24 mg/dL   Calcium 8.2 (L) 8.9 - 10.3 mg/dL   GFR calc non Af Amer >60 >60 mL/min   GFR calc Af Amer >60 >60 mL/min    Comment: (NOTE) The eGFR has been calculated using the CKD EPI equation. This calculation has not been validated in all  clinical situations. eGFR's persistently <60 mL/min signify possible Chronic Kidney Disease.    Anion gap 12 5 - 15  Heparin level (unfractionated)     Status: None   Collection Time: 10/22/15 10:26 AM  Result Value Ref Range   Heparin Unfractionated 0.70 0.30 - 0.70 IU/mL    Comment:        IF HEPARIN RESULTS ARE BELOW EXPECTED VALUES, AND PATIENT DOSAGE HAS BEEN CONFIRMED, SUGGEST FOLLOW UP TESTING OF ANTITHROMBIN III LEVELS.   Heparin level (unfractionated)     Status: None   Collection Time: 10/23/15  4:20 AM  Result Value Ref Range   Heparin Unfractionated 0.38 0.30 - 0.70 IU/mL    Comment:        IF HEPARIN RESULTS ARE BELOW EXPECTED VALUES, AND PATIENT DOSAGE HAS BEEN CONFIRMED, SUGGEST FOLLOW UP TESTING OF ANTITHROMBIN III LEVELS.   CBC     Status: Abnormal   Collection Time: 10/23/15  4:20 AM  Result Value Ref Range   WBC 11.2 (H) 4.0 - 10.5 K/uL   RBC 3.73 (L) 4.22 - 5.81 MIL/uL   Hemoglobin 11.4 (L) 13.0 - 17.0 g/dL   HCT 34.5 (L) 39.0 - 52.0 %   MCV 92.5 78.0 - 100.0 fL   MCH 30.6 26.0 - 34.0 pg   MCHC 33.0 30.0 - 36.0 g/dL   RDW 14.4 11.5 - 15.5 %   Platelets 192 150 - 400 K/uL  Comprehensive metabolic panel     Status: Abnormal   Collection Time: 10/23/15  4:20 AM  Result Value Ref Range   Sodium 133 (L) 135 - 145 mmol/L   Potassium 3.9 3.5 - 5.1 mmol/L   Chloride 98 (L) 101 - 111 mmol/L   CO2 23 22 - 32 mmol/L   Glucose, Bld 106 (H) 65 - 99 mg/dL   BUN 20 6 - 20 mg/dL   Creatinine, Ser 1.11 0.61 - 1.24 mg/dL   Calcium 8.5 (L) 8.9 - 10.3 mg/dL   Total Protein 5.8 (L) 6.5 - 8.1 g/dL   Albumin 3.0 (L) 3.5 - 5.0 g/dL   AST 43 (H) 15 - 41 U/L   ALT 100 (H) 17 - 63 U/L   Alkaline Phosphatase 63 38 - 126 U/L   Total Bilirubin 0.8 0.3 - 1.2 mg/dL   GFR calc non Af Amer >60 >  60 mL/min   GFR calc Af Amer >60 >60 mL/min    Comment: (NOTE) The eGFR has been calculated using the CKD EPI equation. This calculation has not been validated in all clinical  situations. eGFR's persistently <60 mL/min signify possible Chronic Kidney Disease.    Anion gap 12 5 - 15  Blood gas, arterial     Status: Abnormal   Collection Time: 10/23/15  4:35 AM  Result Value Ref Range   FIO2 0.21    Delivery systems ROOM AIR    pH, Arterial 7.511 (H) 7.350 - 7.450   pCO2 arterial 30.8 (L) 35.0 - 45.0 mmHg   pO2, Arterial 66.6 (L) 80.0 - 100.0 mmHg   Bicarbonate 24.4 (H) 20.0 - 24.0 mEq/L   TCO2 25.4 0 - 100 mmol/L   Acid-Base Excess 1.6 0.0 - 2.0 mmol/L   O2 Saturation 93.2 %   Patient temperature 98.6    Collection site LEFT RADIAL    Drawn by [id number]    Sample type ARTERIAL DRAW    Allens test (pass/fail) PASS PASS    Imaging: Imaging results have been reviewed Mild interstitial edema (I personally reviewed)  Tele- NSR  Assessment/Plan:   1. Principal Problem: 2.   Acute MI, inferior wall, initial episode of care ([place]) 3. Active Problems: 4.   Essential hypertension 5.   Hyperlipidemia 6.   Acute combined systolic and diastolic heart failure ([place]) 7.   Complete heart block ([place]) 8.   Time Spent Directly with Patient:  20 minutes  Length of Stay:  LOS: 3 days   POD #3 Inf STEMI (late presentation) with cath documented Tot prox RCA with L-->R collat, LAD/Diag Dz and RI disease . Mod LV dysf (EF 40-45%). Peak trop 10. Didn't get volatility study. BNP >1000 and has interstitial edema on CXR. WIll start low dose lasix. VSS. Will start low dose BB as well. On IV hep. Exam benign. Plan CABG nest Tues with Dr PVT.  Christopher [name] 10/23/2015, 2:23 PM

## 2015-10-23 NOTE — Progress Notes (Signed)
Pt heart rhythm alternating between sinus rhythm with periods of second degree and third degree heart block. HR drops down to the 30's. Will continue to monitor closely.  Sandrea Hammond RN

## 2015-10-23 NOTE — Progress Notes (Addendum)
CARDIAC REHAB PHASE I   PRE:  Rate/Rhythm: 76 SR    BP: sitting 105/57    SaO2: 99 RA  MODE:  Ambulation: 250 ft   POST:  Rate/Rhythm: 86 SR    BP: sitting 111/80     SaO2: 99 RA  Pt upset upon entering room, frustrated with his situation. We were going to give him some time however he then decided he would like to walk. Steady, had assist x2 just in case he had CHB however no actual assist needed.  Pt did become SOB walking. Declined rest initially but did rest when we asked him to rest due to increased SOB. HR stable, 80s SR with x1 episode of 2 PACS with decreased to 67. Pt pale. Sts "I don't know how I feel". He is struggling to process his new diagnosis but was more appreciative and receptive after walking. He was thankful to walk and sts this was the farthest he had gone in days. Declined education right now. Sts he has significant anxiety r/t being in the hospital and did not visit his dad when he was passing. Will f/u tomorrow. [id number]  Harriet Masson CES, ACSM 10/23/2015 8:33 AM

## 2015-10-23 NOTE — Consult Note (Signed)
Patient name: Christopher [name] MRN: [id number] DOB: Sep 20, 1960 Sex: male   Referred by: Christopher [name]    HISTORY OF PRESENT ILLNESS: [name] had evaluated in August 2016 regarding high-grade right carotid stenosis. He had had a episode several months earlier of right sided numbness and weakness while driving. This had resolved completely. He reported several weeks following this and underwent evaluation showing high-grade carotid stenosis by MRA in his right carotid system. No evidence of left carotid stenosis. When I saw him in August he had been 4 months with no symptoms. He did have a high-grade right carotid stenosis which I felt was asymptomatic and suggested elective repair. He was concerned regarding finances and was not willing to proceed with elective endarterectomy. Reported that he wanted to wait until after he had insurance coverage after the first of the year. He did not return for follow-up. He is now admitted with back cardiac event. He denied any prior cardiac history. He does have classic lower extremity claudication and he reports that this is stable. He is a cardiac catheterization during this admission revealed severe three-vessel coronary disease and he is scheduled for coronary artery bypass grafting next week. He underwent noninvasive vascular studies today and this shows that he has greater than 80% right carotid stenosis and no evidence of left carotid disease.  Past Medical History  Diagnosis Date  . Hypertension   . TIA (transient ischemic attack)   . Stenosis of right carotid artery   . Complete heart block (HCC) 10/20/2015    Past Surgical History  Procedure Laterality Date  . Arm surgery Right 2003  . Eye surgery    . Laceration repair    . Cardiac catheterization N/A 10/20/2015    Procedure: Left Heart Cath and Coronary Angiography;  Surgeon: Tonny Bollman, MD;  Location: [facility] INVASIVE CV LAB;  Service: Cardiovascular;  Laterality: N/A;    Social  History   Social History  . Marital Status: Single    Spouse Name: N/A  . Number of Children: N/A  . Years of Education: N/A   Occupational History  . Not on file.   Social History Main Topics  . Smoking status: Former Smoker    Quit date: 03/23/2002  . Smokeless tobacco: Never Used  . 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 Drug Use: Yes    Special: Marijuana     Comment: Daily   . Sexual Activity: Not on file   Other Topics Concern  . Not on file   Social History Narrative   Lives with girlfriend in an apartment on the second floor.     Works as a Teaching laboratory technician for apartment complex.     Has no children.  Education: 10th grade.    Family History  Problem Relation Age of Onset  . Heart disease Mother   . Failure to thrive Father   . Congestive Heart Failure Father     Deceased  . Breast cancer Sister     Living    Allergies as of 10/20/2015 - Review Complete 10/20/2015  Allergen Reaction Noted  . Losartan Shortness Of Breath 10/20/2015  . Lisinopril Rash 10/20/2015    No current facility-administered medications on file prior to encounter.   Current Outpatient Prescriptions on File Prior to Encounter  Medication Sig Dispense Refill  . aspirin 325 MG tablet Take 325 mg by mouth  daily.    . lisinopril (PRINIVIL,ZESTRIL) 20 MG tablet Take 1 tablet (20 mg total) by mouth daily. 30 tablet 5  . lovastatin (MEVACOR) 20 MG tablet Take 1 tablet (20 mg total) by mouth at bedtime. 30 tablet 5     REVIEW OF SYSTEMS:  Reviewed in his history and physical with nothing to add  PHYSICAL EXAMINATION:  General: The patient is a well-nourished male, in no acute distress. Vital signs are BP 108/76 mmHg  Pulse 67  Temp(Src) 98.1 F (36.7 C) (Oral)  Resp 18  Ht  (1.778 m)  Wt 250 lb 11.2 oz (113.717 kg)  BMI 35.97 kg/m2  SpO2 97% Pulmonary: There is a good air  exchange Musculoskeletal: There are no major deformities.  There is no significant extremity pain. Neurologic: No focal weakness or paresthesias are detected, Skin: There are no ulcer or rashes noted. Psychiatric: The patient has normal affect. Cardiovascular: Sinus rhythm    Vascular Lab Studies:  Greater than 80% right carotid stenosis  Impression and Plan:  Had long discussion with the patient and his family present. Explained the recommendation for right carotid endarterectomy for his high-grade carotid stenosis. Splane the option of combined carotid coronary bypass versus staged. His bifurcation looks low and does look as though he would be straightforward endarterectomy. Would recommend combined carotid endarterectomy and coronary bypass are overall reduction of risk. Discussed with [facility] office regarding scheduling. I am not available for Tuesday morning when the patient is on the schedule. Discussed with Dr. Imogene Burn who is available. We'll plan on combined procedure for Tuesday.    Gretta Began [facility] Office: [phone]

## 2015-10-24 LAB — CBC
HCT: 35.4 % — ABNORMAL LOW (ref 39.0–52.0)
HEMOGLOBIN: 11.4 g/dL — AB (ref 13.0–17.0)
MCH: 30.1 pg (ref 26.0–34.0)
MCHC: 32.2 g/dL (ref 30.0–36.0)
MCV: 93.4 fL (ref 78.0–100.0)
Platelets: 206 10*3/uL (ref 150–400)
RBC: 3.79 MIL/uL — AB (ref 4.22–5.81)
RDW: 14.7 % (ref 11.5–15.5)
WBC: 11.2 10*3/uL — AB (ref 4.0–10.5)

## 2015-10-24 LAB — HEPARIN LEVEL (UNFRACTIONATED): Heparin Unfractionated: 0.63 IU/mL (ref 0.30–0.70)

## 2015-10-24 LAB — HEMOGLOBIN A1C
Hgb A1c MFr Bld: 6.2 % — ABNORMAL HIGH (ref 4.8–5.6)
Mean Plasma Glucose: 131 mg/dL

## 2015-10-24 MED ORDER — FUROSEMIDE 20 MG PO TABS
20.0000 mg | ORAL_TABLET | Freq: Once | ORAL | Status: AC
  Administered 2015-10-24: 20 mg via ORAL
  Filled 2015-10-24: qty 1

## 2015-10-24 NOTE — Progress Notes (Signed)
    Subjective:  Denies CP; mild dyspnea   Objective:  Filed Vitals:   10/23/15 0134 10/23/15 1419 10/23/15 2054 10/24/15 0507  BP: 114/70 108/76 107/74 105/73  Pulse: 70 67 72 66  Temp: 98.2 F (36.8 C) 98.1 F (36.7 C) 98.2 F (36.8 C) 98 F (36.7 C)  TempSrc: Oral Oral Oral Oral  Resp: 18 18 18 18   Height:      Weight: 250 lb 11.2 oz (113.717 kg)   251 lb (113.853 kg)  SpO2: 95% 97% 99% 99%    Intake/Output from previous day:  Intake/Output Summary (Last 24 hours) at 10/24/15 1222 Last data filed at 10/24/15 3825  Gross per 24 hour  Intake   1440 ml  Output    650 ml  Net    790 ml    Physical Exam: Physical exam: Well-developed well-nourished in no acute distress.  Skin is warm and dry.  HEENT is normal.  Neck is supple.  Chest is clear to auscultation with normal expansion.  Cardiovascular exam is regular rate and rhythm.  Abdominal exam nontender or distended. No masses palpated. Extremities show no edema. neuro grossly intact    Lab Results: Basic Metabolic Panel:  Recent Labs  05/39/76 0300 10/23/15 0420  NA 132* 133*  K 4.2 3.9  CL 95* 98*  CO2 25 23  GLUCOSE 94 106*  BUN 21* 20  CREATININE 1.12 1.11  CALCIUM 8.2* 8.5*   CBC:  Recent Labs  10/23/15 0420 10/24/15 0442  WBC 11.2* 11.2*  HGB 11.4* 11.4*  HCT 34.5* 35.4*  MCV 92.5 93.4  PLT 192 206    Assessment/Plan:  1 coronary artery disease-continue aspirin, heparin and statin. Plan for coronary artery bypassing graft on Tuesday. 2 cerebrovascular disease-plan for combined carotid endarterectomy on Tuesday. 3 complete heart block-telemetry shows patient continues to have short episodes of complete heart block with junctional escape beats. We will follow. May ultimately need pacemaker. 4 hyperlipidemia-continue statin. 5. Acute combined systolic/diastolic congestive heart failure-mild volume excess. 20 mg of Lasix today.  Olga Millers 10/24/2015, 12:22 PM

## 2015-10-24 NOTE — Progress Notes (Signed)
CCMD called RN about patient going into intermittent second degree and third degree heart block when heart rate drops in the 50's. RN confirmed it and assessed patient but asymptomatic. Made comfortable in bed and will keep monitoring.

## 2015-10-24 NOTE — Progress Notes (Signed)
CARDIAC REHAB PHASE I   PRE:  Rate/Rhythm: 67 SR  BP:  Supine:   Sitting: 137/84  Standing:    SaO2: 100 RA  MODE:  Ambulation: 300 ft   POST:  Rate/Rhythm: 69  BP:  Supine:   Sitting: 159/83  Standing:    SaO2: 100 RA [id number] Assisted X 1 to ambulate. Gait steady. Pt tires easily walking and is DOE. He took two standing rest stops in 300 foot walk. Gave him OHS pt care guide.Pt is anxious about everything and ask many questions.  Melina Copa RN 10/24/2015 12:25 PM

## 2015-10-24 NOTE — Progress Notes (Signed)
ANTICOAGULATION CONSULT NOTE - Follow Up Consult  Pharmacy Consult for Heparin Indication: CAD  Allergies  Allergen Reactions  . Losartan Shortness Of Breath  . Lisinopril Rash    Patient Measurements: Height: 5\' 10"  (177.8 cm) Weight: 251 lb (113.853 kg) IBW/kg (Calculated) : 73 Heparin Dosing Weight: 97.5 kg  Vital Signs: Temp: 98 F (36.7 C) (03/18 0507) Temp Source: Oral (03/18 0507) BP: 105/73 mmHg (03/18 0507) Pulse Rate: 66 (03/18 0507)  Labs:  Recent Labs  10/21/15 1555  10/22/15 0300 10/22/15 1026 10/23/15 0420 10/24/15 0442  HGB  --   < > 13.3  --  11.4* 11.4*  HCT  --   --  41.1  --  34.5* 35.4*  PLT  --   --  128*  --  192 206  HEPARINUNFRC 0.13*  < > 0.13* 0.70 0.38 0.63  CREATININE 1.26*  --  1.12  --  1.11  --   < > = values in this interval not displayed.  Estimated Creatinine Clearance: 97.3 mL/min (by C-G formula based on Cr of 1.11).   Assessment:  Anticoagulation: heparin for ACS. HL 0.63 in goal. Hgb 12.4>13.3>11.4, Plts 206 stable.  Cardiovascular:HTN, CHB, Late presentation MI, totaled RCA which was not reopened, LAD/Cfx stenosis that will likely need graft or pci. High LVEDP on cath, will diurese and stabilize HF prior to any procedures. Did not tolerate cardiac MRI. EF during cath estimated at 40-45%, CVTS: suboptimal targets for CABG but this would be his best long-term therapeutic option Meds: -asa, Lipitor80, heparin (no BB due to heart block, home lisinopril on hold)  Goal of Therapy:  Heparin level 0.3-0.7 units/ml Monitor platelets by anticoagulation protocol: Yes   Plan:  Continue IV heparin at 2100 units/hr Daily HL/CBC    Uday Jantz S. Merilynn Finland, PharmD, BCPS Clinical Staff Pharmacist Pager [phone]  Misty Stanley Stillinger 10/24/2015,12:22 PM

## 2015-10-25 LAB — CBC
HEMATOCRIT: 33.9 % — AB (ref 39.0–52.0)
HEMOGLOBIN: 11 g/dL — AB (ref 13.0–17.0)
MCH: 30.1 pg (ref 26.0–34.0)
MCHC: 32.4 g/dL (ref 30.0–36.0)
MCV: 92.6 fL (ref 78.0–100.0)
Platelets: 191 10*3/uL (ref 150–400)
RBC: 3.66 MIL/uL — AB (ref 4.22–5.81)
RDW: 14.7 % (ref 11.5–15.5)
WBC: 10.7 10*3/uL — ABNORMAL HIGH (ref 4.0–10.5)

## 2015-10-25 LAB — BASIC METABOLIC PANEL
Anion gap: 10 (ref 5–15)
BUN: 18 mg/dL (ref 6–20)
CHLORIDE: 101 mmol/L (ref 101–111)
CO2: 21 mmol/L — AB (ref 22–32)
CREATININE: 1.1 mg/dL (ref 0.61–1.24)
Calcium: 8.5 mg/dL — ABNORMAL LOW (ref 8.9–10.3)
GFR calc non Af Amer: >60 mL/min (ref >60)
Glucose, Bld: 107 mg/dL — ABNORMAL HIGH (ref 65–99)
POTASSIUM: 4 mmol/L (ref 3.5–5.1)
SODIUM: 132 mmol/L — AB (ref 135–145)

## 2015-10-25 LAB — HEPARIN LEVEL (UNFRACTIONATED): HEPARIN UNFRACTIONATED: 0.59 [IU]/mL (ref 0.30–0.70)

## 2015-10-25 MED ORDER — FUROSEMIDE 40 MG PO TABS
40.0000 mg | ORAL_TABLET | Freq: Every day | ORAL | Status: DC
  Administered 2015-10-25: 40 mg via ORAL
  Filled 2015-10-25: qty 1

## 2015-10-25 NOTE — Progress Notes (Signed)
    Subjective:  Denies CP; mild dyspnea   Objective:  Filed Vitals:   10/24/15 0507 10/24/15 1541 10/24/15 2025 10/25/15 0519  BP: 105/73 106/73 111/72 107/70  Pulse: 66 70 72 71  Temp: 98 F (36.7 C) 97.9 F (36.6 C) 97.8 F (36.6 C) 97.7 F (36.5 C)  TempSrc: Oral Oral Oral Oral  Resp: 18 17 18 18   Height:      Weight: 251 lb (113.853 kg)   250 lb 8 oz (113.626 kg)  SpO2: 99% 100% 98% 96%    Intake/Output from previous day:  Intake/Output Summary (Last 24 hours) at 10/25/15 1027 Last data filed at 10/25/15 2992  Gross per 24 hour  Intake    499 ml  Output   1775 ml  Net  -1276 ml    Physical Exam: Physical exam: Well-developed well-nourished in no acute distress.  Skin is warm and dry.  HEENT is normal.  Neck is supple.  Chest is clear to auscultation with normal expansion.  Cardiovascular exam is regular rate and rhythm.  Abdominal exam nontender or distended. No masses palpated. Extremities show trace edema. neuro grossly intact    Lab Results: Basic Metabolic Panel:  Recent Labs  42/68/34 0420 10/25/15 0258  NA 133* 132*  K 3.9 4.0  CL 98* 101  CO2 23 21*  GLUCOSE 106* 107*  BUN 20 18  CREATININE 1.11 1.10  CALCIUM 8.5* 8.5*   CBC:  Recent Labs  10/24/15 0442 10/25/15 0258  WBC 11.2* 10.7*  HGB 11.4* 11.0*  HCT 35.4* 33.9*  MCV 93.4 92.6  PLT 206 191    Assessment/Plan:  1 coronary artery disease-continue aspirin, heparin and statin. Plan for coronary artery bypassing graft on Tuesday. 2 cerebrovascular disease-plan for combined carotid endarterectomy on Tuesday. 3 complete heart block-telemetry shows patient continues to have short episodes of complete heart block with junctional escape beats; no symptoms. We will follow. May ultimately need pacemaker. 4 hyperlipidemia-continue statin. 5. Acute combined systolic/diastolic congestive heart failure-mild volume excess. Continue present dose of lasix  Olga Millers 10/25/2015,  10:27 AM

## 2015-10-25 NOTE — Progress Notes (Signed)
CCMD called about PT having a short period of third degree block, pt assessed, and he is asymptomatic, HR is 67 now will continue to monitor.

## 2015-10-25 NOTE — Progress Notes (Signed)
ANTICOAGULATION CONSULT NOTE - Follow Up Consult  Pharmacy Consult for Heparin Indication: CAD  Allergies  Allergen Reactions  . Losartan Shortness Of Breath  . Lisinopril Rash    Patient Measurements: Height: 5\' 10"  (177.8 cm) Weight: 250 lb 8 oz (113.626 kg) IBW/kg (Calculated) : 73 Heparin Dosing Weight: 97.5 kg  Vital Signs: Temp: 97.7 F (36.5 C) (03/19 0519) Temp Source: Oral (03/19 0519) BP: 107/70 mmHg (03/19 0519) Pulse Rate: 71 (03/19 0519)  Labs:  Recent Labs  10/23/15 0420 10/24/15 0442 10/25/15 0258  HGB 11.4* 11.4* 11.0*  HCT 34.5* 35.4* 33.9*  PLT 192 206 191  HEPARINUNFRC 0.38 0.63 0.59  CREATININE 1.11  --  1.10    Estimated Creatinine Clearance: 98 mL/min (by C-G formula based on Cr of 1.1).   Assessment:  Anticoagulation: heparin for ACS. HL 0.59in goal. Hgb 12.4>13.3>11.4>11, Plts 191 stable.   Goal of Therapy:  Heparin level 0.3-0.7 units/ml Monitor platelets by anticoagulation protocol: Yes   Plan:  Continue IV heparin at 2100 units/hr Daily HL/CBC    Aylah Yeary S. Merilynn Finland, PharmD, BCPS Clinical Staff Pharmacist Pager [phone]  Misty Stanley Stillinger 10/25/2015,11:53 AM

## 2015-10-26 ENCOUNTER — Inpatient Hospital Stay (HOSPITAL_COMMUNITY): Payer: BLUE CROSS/BLUE SHIELD

## 2015-10-26 DIAGNOSIS — I2510 Atherosclerotic heart disease of native coronary artery without angina pectoris: Secondary | ICD-10-CM | POA: Insufficient documentation

## 2015-10-26 DIAGNOSIS — I739 Peripheral vascular disease, unspecified: Secondary | ICD-10-CM

## 2015-10-26 DIAGNOSIS — I110 Hypertensive heart disease with heart failure: Secondary | ICD-10-CM

## 2015-10-26 DIAGNOSIS — I779 Disorder of arteries and arterioles, unspecified: Secondary | ICD-10-CM | POA: Insufficient documentation

## 2015-10-26 DIAGNOSIS — I6521 Occlusion and stenosis of right carotid artery: Secondary | ICD-10-CM

## 2015-10-26 LAB — CBC
HCT: 33 % — ABNORMAL LOW (ref 39.0–52.0)
HEMOGLOBIN: 10.8 g/dL — AB (ref 13.0–17.0)
MCH: 30.4 pg (ref 26.0–34.0)
MCHC: 32.7 g/dL (ref 30.0–36.0)
MCV: 93 fL (ref 78.0–100.0)
PLATELETS: 190 10*3/uL (ref 150–400)
RBC: 3.55 MIL/uL — AB (ref 4.22–5.81)
RDW: 14.6 % (ref 11.5–15.5)
WBC: 11.1 10*3/uL — ABNORMAL HIGH (ref 4.0–10.5)

## 2015-10-26 LAB — SPIROMETRY WITH GRAPH
FEF 25-75 Post: 1.37 L/sec
FEF 25-75 Pre: 1.91 L/sec
FEF2575-%Change-Post: -28 %
FEF2575-%Pred-Post: 41 %
FEF2575-%Pred-Pre: 57 %
FEV1-%Change-Post: 2 %
FEV1-%Pred-Post: 68 %
FEV1-%Pred-Pre: 66 %
FEV1-Post: 2.66 L
FEV1-Pre: 2.59 L
FEV1FVC-%Change-Post: 0 %
FEV1FVC-%Pred-Pre: 97 %
FEV6-%Change-Post: -7 %
FEV6-%Pred-Post: 65 %
FEV6-%Pred-Pre: 70 %
FEV6-Post: 3.15 L
FEV6-Pre: 3.42 L
FEV6FVC-%Change-Post: -10 %
FEV6FVC-%Pred-Post: 92 %
FEV6FVC-%Pred-Pre: 102 %
FVC-%Change-Post: 2 %
FVC-%Pred-Post: 70 %
FVC-%Pred-Pre: 68 %
FVC-Post: 3.56 L
FVC-Pre: 3.46 L
Post FEV1/FVC ratio: 75 %
Post FEV6/FVC ratio: 88 %
Pre FEV1/FVC ratio: 75 %
Pre FEV6/FVC Ratio: 99 %

## 2015-10-26 LAB — APTT: aPTT: 103 seconds — ABNORMAL HIGH (ref 24–37)

## 2015-10-26 LAB — SURGICAL PCR SCREEN
MRSA, PCR: NEGATIVE
Staphylococcus aureus: NEGATIVE

## 2015-10-26 LAB — ABO/RH: ABO/RH(D): A POS

## 2015-10-26 LAB — HEPARIN LEVEL (UNFRACTIONATED): HEPARIN UNFRACTIONATED: 0.51 [IU]/mL (ref 0.30–0.70)

## 2015-10-26 LAB — PREPARE RBC (CROSSMATCH)

## 2015-10-26 MED ORDER — DEXTROSE 5 % IV SOLN
1.5000 g | INTRAVENOUS | Status: AC
  Administered 2015-10-27: 1.5 g via INTRAVENOUS
  Administered 2015-10-27: .75 g via INTRAVENOUS
  Filled 2015-10-26: qty 1.5

## 2015-10-26 MED ORDER — DIAZEPAM 5 MG PO TABS
5.0000 mg | ORAL_TABLET | Freq: Once | ORAL | Status: AC
  Administered 2015-10-27: 5 mg via ORAL
  Filled 2015-10-26: qty 1

## 2015-10-26 MED ORDER — SODIUM CHLORIDE 0.9 % IV SOLN
INTRAVENOUS | Status: AC
  Administered 2015-10-27: 1.3 [IU]/h via INTRAVENOUS
  Filled 2015-10-26: qty 2.5

## 2015-10-26 MED ORDER — METOPROLOL TARTRATE 12.5 MG HALF TABLET: 12.5000 mg | ORAL_TABLET | Freq: Once | ORAL | Status: DC

## 2015-10-26 MED ORDER — PAPAVERINE HCL 30 MG/ML IJ SOLN
INTRAMUSCULAR | Status: DC
  Filled 2015-10-26: qty 2.5

## 2015-10-26 MED ORDER — CHLORHEXIDINE GLUCONATE 4 % EX LIQD
60.0000 mL | Freq: Once | CUTANEOUS | Status: AC
  Administered 2015-10-26: 4 via TOPICAL

## 2015-10-26 MED ORDER — MAGNESIUM SULFATE 50 % IJ SOLN
40.0000 meq | INTRAMUSCULAR | Status: DC
  Filled 2015-10-26: qty 10

## 2015-10-26 MED ORDER — SODIUM CHLORIDE 0.9 % IV SOLN
INTRAVENOUS | Status: DC
  Filled 2015-10-26: qty 30

## 2015-10-26 MED ORDER — BISACODYL 5 MG PO TBEC
5.0000 mg | DELAYED_RELEASE_TABLET | Freq: Once | ORAL | Status: DC
Start: 2015-10-26 — End: 2015-10-27

## 2015-10-26 MED ORDER — AMINOCAPROIC ACID 250 MG/ML IV SOLN
INTRAVENOUS | Status: DC
  Filled 2015-10-26: qty 40

## 2015-10-26 MED ORDER — CEFUROXIME SODIUM 750 MG IJ SOLR
750.0000 mg | INTRAMUSCULAR | Status: DC
  Filled 2015-10-26: qty 750

## 2015-10-26 MED ORDER — CHLORHEXIDINE GLUCONATE 4 % EX LIQD
60.0000 mL | Freq: Once | CUTANEOUS | Status: AC
  Administered 2015-10-27: 4 via TOPICAL
  Filled 2015-10-26: qty 60

## 2015-10-26 MED ORDER — CHLORHEXIDINE GLUCONATE 4 % EX LIQD
CUTANEOUS | Status: AC
  Administered 2015-10-26: 20:00:00
  Filled 2015-10-26: qty 15

## 2015-10-26 MED ORDER — EPINEPHRINE HCL 1 MG/ML IJ SOLN
0.0000 ug/min | INTRAVENOUS | Status: DC
  Filled 2015-10-26: qty 4

## 2015-10-26 MED ORDER — VANCOMYCIN HCL 10 G IV SOLR
1500.0000 mg | INTRAVENOUS | Status: AC
  Administered 2015-10-27: 1500 mg via INTRAVENOUS
  Filled 2015-10-26: qty 1500

## 2015-10-26 MED ORDER — DOPAMINE-DEXTROSE 3.2-5 MG/ML-% IV SOLN
0.0000 ug/kg/min | INTRAVENOUS | Status: DC
  Filled 2015-10-26: qty 250

## 2015-10-26 MED ORDER — CHLORHEXIDINE GLUCONATE 0.12 % MT SOLN
15.0000 mL | Freq: Once | OROMUCOSAL | Status: AC
  Administered 2015-10-27: 15 mL via OROMUCOSAL

## 2015-10-26 MED ORDER — NITROGLYCERIN IN D5W 200-5 MCG/ML-% IV SOLN
2.0000 ug/min | INTRAVENOUS | Status: AC
  Administered 2015-10-27: 5 ug/min via INTRAVENOUS
  Filled 2015-10-26: qty 250

## 2015-10-26 MED ORDER — DEXMEDETOMIDINE HCL IN NACL 400 MCG/100ML IV SOLN
0.1000 ug/kg/h | INTRAVENOUS | Status: DC
  Filled 2015-10-26: qty 100

## 2015-10-26 MED ORDER — PHENYLEPHRINE HCL 10 MG/ML IJ SOLN
30.0000 ug/min | INTRAVENOUS | Status: DC
  Filled 2015-10-26: qty 2

## 2015-10-26 MED ORDER — ALBUTEROL SULFATE (2.5 MG/3ML) 0.083% IN NEBU
2.5000 mg | INHALATION_SOLUTION | Freq: Once | RESPIRATORY_TRACT | Status: AC
  Administered 2015-10-26: 2.5 mg via RESPIRATORY_TRACT

## 2015-10-26 MED ORDER — ALPRAZOLAM 0.25 MG PO TABS
0.2500 mg | ORAL_TABLET | ORAL | Status: DC | PRN
Start: 2015-10-26 — End: 2015-10-27

## 2015-10-26 MED ORDER — TEMAZEPAM 15 MG PO CAPS: 15.0000 mg | ORAL_CAPSULE | Freq: Once | ORAL | Status: DC | PRN

## 2015-10-26 MED ORDER — POTASSIUM CHLORIDE 2 MEQ/ML IV SOLN
80.0000 meq | INTRAVENOUS | Status: DC
Start: 2015-10-27 — End: 2015-10-27
  Filled 2015-10-26: qty 40

## 2015-10-26 MED ORDER — FUROSEMIDE 10 MG/ML IJ SOLN
40.0000 mg | Freq: Once | INTRAMUSCULAR | Status: AC
  Administered 2015-10-26: 40 mg via INTRAVENOUS
  Filled 2015-10-26: qty 4

## 2015-10-26 NOTE — Progress Notes (Signed)
Patient Name: Christopher [name] Date of Encounter: 10/26/2015   Primary Cardiologist: Dr. Excell Seltzer Patient Profile: Mr. Carlough is a 55 year old male with a past medical history of HTN, TIA, CHB, and right carotid artery stenosis.  He presented to [facility] on 10/20/15 with SOB, and "heartburn" for a prolonged period of time the week before.  He was found to have an inferior STEMI with late presentation >72 hours.  Heart cath showed total occlusion of RCA, and 95% stenosed LAD.  His plan is for CABG on Tuesday 10/27/15.    SUBJECTIVE: Feels ok, tired. Denies chest pain, does feel SOB at rest.   OBJECTIVE Filed Vitals:   10/25/15 1455 10/25/15 1959 10/26/15 0000 10/26/15 0441  BP: 110/76 106/68  106/61  Pulse: 72 72  71  Temp: 97.7 F (36.5 C) 98.5 F (36.9 C)  97.8 F (36.6 C)  TempSrc: Oral Oral  Oral  Resp: 17 18 20 18   Height:      Weight:    251 lb 14.4 oz (114.261 kg)  SpO2: 96% 96%  95%    Intake/Output Summary (Last 24 hours) at 10/26/15 0823 Last data filed at 10/26/15 0600  Gross per 24 hour  Intake 943.85 ml  Output   1200 ml  Net -256.15 ml   Filed Weights   10/24/15 0507 10/25/15 0519 10/26/15 0441  Weight: 251 lb (113.853 kg) 250 lb 8 oz (113.626 kg) 251 lb 14.4 oz (114.261 kg)    PHYSICAL EXAM General: Well developed, well nourished, male in no acute distress. Head: Normocephalic, atraumatic.  Neck: Supple without bruits, No JVD. Lungs:  Resp regular and unlabored, CTA. Heart: RRR, S1, S2, no S3, S4, No murmur; no rub. Abdomen: Soft, non-tender, non-distended, BS + x 4.  Extremities: No clubbing, cyanosis, edema.  Neuro: Alert and oriented X 3. Moves all extremities spontaneously. Psych: Normal affect.  LABS: CBC: Recent Labs  10/25/15 0258 10/26/15 0311  WBC 10.7* 11.1*  HGB 11.0* 10.8*  HCT 33.9* 33.0*  MCV 92.6 93.0  PLT 191 190   INR:No results for input(s): INR in the last 72 hours. Basic Metabolic Panel: Recent Labs  10/25/15 0258  NA 132*  K 4.0  CL 101  CO2 21*  GLUCOSE 107*  BUN 18  CREATININE 1.10  CALCIUM 8.5*   BNP:  B NATRIURETIC PEPTIDE  Date/Time Value Ref Range Status  10/20/2015 08:30 PM 1396.4* 0.0 - 100.0 pg/mL Final  10/20/2015 05:45 PM 1390.0* 0.0 - 100.0 pg/mL Final    TELE:        ECG:   Radiology/Studies: Cath 10/20/15:   Ramus lesion, 70% stenosed.  Prox LAD lesion, 95% stenosed.  Prox RCA lesion, 100% stenosed.  There is moderate left ventricular systolic dysfunction.  1. Total occlusion of the RCA with left-to-right collaterals, suspected culprit for late presenting inferior MI 2. Critical diffuse stenosis of the LAD and severe diagonal stenosis 3. Moderate stenosis of the ramus intermedius and no significant disease of the left circumflex 4. Moderate segmental LV systolic dysfunction with inferior wall akinesis  Echo: 10/21/15 - Left ventricle: The cavity size was normal. There was mild  concentric hypertrophy. Systolic function was mildly to  moderately reduced. The estimated ejection fraction was in the  range of 40% to 45%. Features are consistent with a pseudonormal  left ventricular filling pattern, with concomitant abnormal  relaxation and increased filling pressure (grade 2 diastolic  dysfunction). Doppler parameters are consistent with high  ventricular filling pressure. - Regional wall motion abnormality: Akinesis of the basal-mid  inferoseptal, entire inferior, and apical septal myocardium;  hypokinesis of the basal-mid anteroseptal myocardium. Consistent  with prior RCA infarct. - Aortic valve: Transvalvular velocity was within the normal range.  There was no stenosis. There was no regurgitation. - Mitral valve: There was mild regurgitation. - Right ventricle: The cavity size was normal. Wall thickness was  normal. Systolic function was mildly reduced. - Tricuspid valve: There was mild regurgitation. - Pulmonary arteries:  Systolic pressure was within the normal  range. PA peak pressure: 32 mm Hg (S). - Inferior vena cava: The vessel was dilated. The respirophasic  diameter changes were in the normal range (>= 50%), consistent  with normal central venous pressure.  Current Medications:  . [START ON 10/27/2015] aminocaproic acid (AMICAR) for OHS   Intravenous To OR  . antiseptic oral rinse  7 mL Mouth Rinse BID  . aspirin  325 mg Oral Daily  . atorvastatin  80 mg Oral q1800  . bisacodyl  5 mg Oral Once  . [START ON 10/27/2015] cefUROXime (ZINACEF)  IV  1.5 g Intravenous To OR  . [START ON 10/27/2015] cefUROXime (ZINACEF)  IV  750 mg Intravenous To OR  . [START ON 10/27/2015] dexmedetomidine  0.1-0.7 mcg/kg/hr Intravenous To OR  . [START ON 10/27/2015] diazepam  5 mg Oral Once  . [START ON 10/27/2015] DOPamine  0-10 mcg/kg/min Intravenous To OR  . [START ON 10/27/2015] epinephrine  0-10 mcg/min Intravenous To OR  . [START ON 10/27/2015] heparin-papaverine-plasmalyte irrigation   Irrigation To OR  . [START ON 10/27/2015] heparin 30,000 units/NS 1000 mL solution for CELLSAVER   Other To OR  . [START ON 10/27/2015] insulin (NOVOLIN-R) infusion   Intravenous To OR  . [START ON 10/27/2015] magnesium sulfate  40 mEq Other To OR  . [START ON 10/27/2015] metoprolol tartrate  12.5 mg Oral Once  . [START ON 10/27/2015] nitroGLYCERIN  2-200 mcg/min Intravenous To OR  . [START ON 10/27/2015] phenylephrine (NEO-SYNEPHRINE) Adult infusion  30-200 mcg/min Intravenous To OR  . [START ON 10/27/2015] potassium chloride  80 mEq Other To OR  . [START ON 10/27/2015] vancomycin  1,500 mg Intravenous To OR   . heparin 2,100 Units/hr (10/26/15 0300)    ASSESSMENT AND PLAN: Principal Problem:   Acute MI, inferior wall, initial episode of care [facility]) Active Problems:   Essential hypertension   Hyperlipidemia   Acute combined systolic and diastolic heart failure (HCC)   Complete heart block (HCC)  1. CAD- Plan is for CABG on Tuesday  10/27/15.  He is on ASA, heparin and statin. Will need to address the need for Plavix after surgery.   2. Cerebrovascular disease - Patient had high grade right carotid stenosis in Aug. 2016.  Pre CABG dopplers that revealed 80-99% stenosis.  Vascular surgery plans to do right carotid endarterectomy combined with CABG.    3. Wenckebach vs. Blocked PAC's on tele - Patient had episodes of CHB when he first presented to ED, looks like he has periods of Wenckebach and blocked PAC's now.  He is asymptomatic.  We will follow closely after CABG.  4. HLD - LDL is 98.  On high dose statin.   5. Acute combined Systolic and diastolic CHF - Patient's LVEDP was elevated, patient was placed on IV diureses.  Weight stable.  Echo reveals EF of 40-45%, grade 2 DD, akinesis of basal - mid inferoseptal, entire inferior and apical septal myocardium consistent with RCA  infarct.  Renal function stable.   Signed, Little Ishikawa , NP 8:23 AM 10/26/2015 Pager [phone]

## 2015-10-26 NOTE — Progress Notes (Signed)
CARDIAC REHAB PHASE I   PRE:  Rate/Rhythm: 69 SR  BP:  Sitting: 124/86        SaO2: 97 RA  MODE:  Ambulation: 250 ft   POST:  Rate/Rhythm: 81 SR  BP:  Sitting: 142/98         SaO2: 98 RA  Pt ambulated 250 ft on RA, IV, independent, fairly steady gait, tolerated fair. Pt c/o continued significant DOE, 2/10 chest tightness (improved with rest-RN notified), brief standing rest x1. VSS. Pt to edge of bed after walk per pt request, call bell within reach. Will follow post-op.   [phone] Joylene Grapes, RN, BSN 10/26/2015 3:00 PM

## 2015-10-26 NOTE — Anesthesia Preprocedure Evaluation (Addendum)
Anesthesia Evaluation  Patient identified by MRN, date of birth, ID band Patient awake    Reviewed: Allergy & Precautions, NPO status , Patient's Chart, lab work & pertinent test results  History of Anesthesia Complications Negative for: history of anesthetic complications  Airway Mallampati: II   Neck ROM: Full    Dental  (+) Edentulous Upper, Partial Lower, Dental Advisory Given   Pulmonary COPD,  COPD inhaler, former smoker,    breath sounds clear to auscultation       Cardiovascular hypertension, Pt. on medications + CAD, + Past MI (recent MI) and + Peripheral Vascular Disease (80% RIC stenosis)  + dysrhythmias (complete heart block with recent MI)  Rhythm:Regular Rate:Normal  10/21/15 ECHO: EF 40-45%, infero-septal akinesis, anteroseptal hypokinesis, mild MR   Neuro/Psych TIA   GI/Hepatic negative GI ROS, Elevated LFTs: h/o ETOH abuse   Endo/Other  Morbid obesity  Renal/GU negative Renal ROS     Musculoskeletal   Abdominal (+) + obese,   Peds  Hematology  (+) Blood dyscrasia (Hb 10.8), ,   Anesthesia Other Findings   Reproductive/Obstetrics                           Anesthesia Physical Anesthesia Plan  ASA: IV  Anesthesia Plan: General   Post-op Pain Management:    Induction: Intravenous  Airway Management Planned: Oral ETT  Additional Equipment: Arterial line, CVP, PA Cath, TEE and Ultrasound Guidance Line Placement  Intra-op Plan:   Post-operative Plan: Post-operative intubation/ventilation  Informed Consent: I have reviewed the patients History and Physical, chart, labs and discussed the procedure including the risks, benefits and alternatives for the proposed anesthesia with the patient or authorized representative who has indicated his/her understanding and acceptance.   Dental advisory given  Plan Discussed with: CRNA and Surgeon  Anesthesia Plan Comments: (Plan  routine monitors, A line, PA cath, GETA with TEE andpost op ventilation)        Anesthesia Quick Evaluation

## 2015-10-26 NOTE — Progress Notes (Signed)
   Daily Progress Note  Assessment/Planning: Asx R ICA stenosis >80%, CAD requiring CABG   Dr. Arbie Cookey and Dr. Donata Clay has arranged a combined R CEA/CABG for tomorrow  I will be performing the R CEA for Dr. Arbie Cookey. I discussed with the patient the risks, benefits, and alternatives to carotid endarterectomy.   The patient is aware that the risks of carotid endarterectomy include but are not limited to: bleeding, infection, stroke, myocardial infarction, death, cranial nerve injuries both temporary and permanent, neck hematoma, possible airway compromise, labile blood pressure post-operatively, cerebral hyperperfusion syndrome, and possible need for additional interventions in the future.  The patient is aware the morbidity rate in combined procedures is usually higher. The patient is aware of the risks and agrees to proceed forward with the procedure.  Subjective  - 6 Days Post-Op  No complaints, no events overnight  Objective Filed Vitals:   10/25/15 1455 10/25/15 1959 10/26/15 0000 10/26/15 0441  BP: 110/76 106/68  106/61  Pulse: 72 72  71  Temp: 97.7 F (36.5 C) 98.5 F (36.9 C)  97.8 F (36.6 C)  TempSrc: Oral Oral  Oral  Resp: 17 18 20 18   Height:      Weight:    251 lb 14.4 oz (114.261 kg)  SpO2: 96% 96%  95%    Intake/Output Summary (Last 24 hours) at 10/26/15 1023 Last data filed at 10/26/15 0842  Gross per 24 hour  Intake 943.85 ml  Output   1500 ml  Net -556.15 ml    PULM  CTAB CV  RRR GI  soft, NTND NEURO CN 2-12 intact, motor sx 5/5 Laboratory CBC    Component Value Date/Time   WBC 11.1* 10/26/2015 0311   HGB 10.8* 10/26/2015 0311   HCT 33.0* 10/26/2015 0311   PLT 190 10/26/2015 0311    BMET    Component Value Date/Time   NA 132* 10/25/2015 0258   K 4.0 10/25/2015 0258   CL 101 10/25/2015 0258   CO2 21* 10/25/2015 0258   GLUCOSE 107* 10/25/2015 0258   BUN 18 10/25/2015 0258   CREATININE 1.10 10/25/2015 0258   CALCIUM 8.5* 10/25/2015 0258   GFRNONAA >60 10/25/2015 0258   GFRAA >60 10/25/2015 0258    Leonides Sake, MD [facility] Office: [phone] Pager: [phone]  10/26/2015, 10:23 AM

## 2015-10-26 NOTE — Progress Notes (Signed)
ANTICOAGULATION CONSULT NOTE - Follow Up Consult  Pharmacy Consult for Heparin Indication: CAD  Allergies  Allergen Reactions  . Losartan Shortness Of Breath  . Lisinopril Rash    Patient Measurements: Height: 5\' 10"  (177.8 cm) Weight: 251 lb 14.4 oz (114.261 kg) IBW/kg (Calculated) : 73 Heparin Dosing Weight: 97.5 kg  Vital Signs: Temp: 97.8 F (36.6 C) (03/20 0441) Temp Source: Oral (03/20 0441) BP: 106/61 mmHg (03/20 0441) Pulse Rate: 71 (03/20 0441)  Labs:  Recent Labs  10/24/15 0442 10/25/15 0258 10/26/15 0311  HGB 11.4* 11.0* 10.8*  HCT 35.4* 33.9* 33.0*  PLT 206 191 190  HEPARINUNFRC 0.63 0.59 0.51  CREATININE  --  1.10  --     Estimated Creatinine Clearance: 98.3 mL/min (by C-G formula based on Cr of 1.1).   Assessment: 55 yo male on heparin for ACS. HL 0.51 and  in goal. Hgb 10.8, Plts 190 and stable. Noted plans for CABG on 3/21.   Goal of Therapy:  Heparin level 0.3-0.7 units/ml Monitor platelets by anticoagulation protocol: Yes   Plan:  Continue IV heparin at 2100 units/hr Daily HL/CBC  Harland German, Pharm D 10/26/2015 8:40 AM

## 2015-10-27 ENCOUNTER — Inpatient Hospital Stay (HOSPITAL_COMMUNITY): Payer: BLUE CROSS/BLUE SHIELD | Admitting: Certified Registered Nurse Anesthetist

## 2015-10-27 ENCOUNTER — Inpatient Hospital Stay (HOSPITAL_COMMUNITY): Payer: BLUE CROSS/BLUE SHIELD

## 2015-10-27 ENCOUNTER — Encounter (HOSPITAL_COMMUNITY): Payer: Self-pay | Admitting: Certified Registered Nurse Anesthetist

## 2015-10-27 ENCOUNTER — Other Ambulatory Visit: Payer: Self-pay

## 2015-10-27 ENCOUNTER — Encounter (HOSPITAL_COMMUNITY)
Admission: EM | Disposition: A | Discharge status: Rehabilitation Facility | Payer: Self-pay | Source: Other Acute Inpatient Hospital | Attending: Cardiothoracic Surgery

## 2015-10-27 DIAGNOSIS — I5041 Acute combined systolic (congestive) and diastolic (congestive) heart failure: Secondary | ICD-10-CM

## 2015-10-27 DIAGNOSIS — I6521 Occlusion and stenosis of right carotid artery: Secondary | ICD-10-CM

## 2015-10-27 DIAGNOSIS — I2119 ST elevation (STEMI) myocardial infarction involving other coronary artery of inferior wall: Secondary | ICD-10-CM

## 2015-10-27 DIAGNOSIS — Z951 Presence of aortocoronary bypass graft: Secondary | ICD-10-CM

## 2015-10-27 DIAGNOSIS — I2510 Atherosclerotic heart disease of native coronary artery without angina pectoris: Secondary | ICD-10-CM

## 2015-10-27 HISTORY — PX: ENDARTERECTOMY: SHX5162

## 2015-10-27 HISTORY — PX: TEE WITHOUT CARDIOVERSION: SHX5443

## 2015-10-27 HISTORY — PX: CORONARY ARTERY BYPASS GRAFT: SHX141

## 2015-10-27 LAB — POCT I-STAT, CHEM 8
BUN: 17 mg/dL (ref 6–20)
BUN: 15 mg/dL (ref 6–20)
BUN: 17 mg/dL (ref 6–20)
BUN: 16 mg/dL (ref 6–20)
BUN: 15 mg/dL (ref 6–20)
BUN: 17 mg/dL (ref 6–20)
BUN: 17 mg/dL (ref 6–20)
BUN: 17 mg/dL (ref 6–20)
BUN: 18 mg/dL (ref 6–20)
CALCIUM ION: 1.2 mmol/L (ref 1.12–1.23)
CALCIUM ION: 1.05 mmol/L — AB (ref 1.12–1.23)
CALCIUM ION: 1.05 mmol/L — AB (ref 1.12–1.23)
CALCIUM ION: 1.18 mmol/L (ref 1.12–1.23)
CHLORIDE: 99 mmol/L — AB (ref 101–111)
CHLORIDE: 100 mmol/L — AB (ref 101–111)
CHLORIDE: 99 mmol/L — AB (ref 101–111)
CHLORIDE: 95 mmol/L — AB (ref 101–111)
CREATININE: 0.7 mg/dL (ref 0.61–1.24)
CREATININE: 0.9 mg/dL (ref 0.61–1.24)
CREATININE: 1.1 mg/dL (ref 0.61–1.24)
CREATININE: 1 mg/dL (ref 0.61–1.24)
CREATININE: 1.1 mg/dL (ref 0.61–1.24)
Calcium, Ion: 1.08 mmol/L — ABNORMAL LOW (ref 1.12–1.23)
Calcium, Ion: 1.19 mmol/L (ref 1.12–1.23)
Calcium, Ion: 1.17 mmol/L (ref 1.12–1.23)
Calcium, Ion: 1.01 mmol/L — ABNORMAL LOW (ref 1.12–1.23)
Calcium, Ion: 1.16 mmol/L (ref 1.12–1.23)
Chloride: 105 mmol/L (ref 101–111)
Chloride: 98 mmol/L — ABNORMAL LOW (ref 101–111)
Chloride: 97 mmol/L — ABNORMAL LOW (ref 101–111)
Chloride: 98 mmol/L — ABNORMAL LOW (ref 101–111)
Chloride: 99 mmol/L — ABNORMAL LOW (ref 101–111)
Creatinine, Ser: 1 mg/dL (ref 0.61–1.24)
Creatinine, Ser: 1 mg/dL (ref 0.61–1.24)
Creatinine, Ser: 1.1 mg/dL (ref 0.61–1.24)
Creatinine, Ser: 1.1 mg/dL (ref 0.61–1.24)
GLUCOSE: 126 mg/dL — AB (ref 65–99)
GLUCOSE: 109 mg/dL — AB (ref 65–99)
GLUCOSE: 153 mg/dL — AB (ref 65–99)
GLUCOSE: 164 mg/dL — AB (ref 65–99)
GLUCOSE: 199 mg/dL — AB (ref 65–99)
Glucose, Bld: 126 mg/dL — ABNORMAL HIGH (ref 65–99)
Glucose, Bld: 108 mg/dL — ABNORMAL HIGH (ref 65–99)
Glucose, Bld: 120 mg/dL — ABNORMAL HIGH (ref 65–99)
Glucose, Bld: 118 mg/dL — ABNORMAL HIGH (ref 65–99)
HCT: 24 % — ABNORMAL LOW (ref 39.0–52.0)
HCT: 30 % — ABNORMAL LOW (ref 39.0–52.0)
HCT: 26 % — ABNORMAL LOW (ref 39.0–52.0)
HCT: 30 % — ABNORMAL LOW (ref 39.0–52.0)
HEMATOCRIT: 33 % — AB (ref 39.0–52.0)
HEMATOCRIT: 29 % — AB (ref 39.0–52.0)
HEMATOCRIT: 31 % — AB (ref 39.0–52.0)
HEMATOCRIT: 30 % — AB (ref 39.0–52.0)
HEMATOCRIT: 31 % — AB (ref 39.0–52.0)
HEMOGLOBIN: 9.9 g/dL — AB (ref 13.0–17.0)
HEMOGLOBIN: 10.5 g/dL — AB (ref 13.0–17.0)
HEMOGLOBIN: 8.2 g/dL — AB (ref 13.0–17.0)
HEMOGLOBIN: 10.2 g/dL — AB (ref 13.0–17.0)
HEMOGLOBIN: 8.8 g/dL — AB (ref 13.0–17.0)
Hemoglobin: 11.2 g/dL — ABNORMAL LOW (ref 13.0–17.0)
Hemoglobin: 10.2 g/dL — ABNORMAL LOW (ref 13.0–17.0)
Hemoglobin: 10.5 g/dL — ABNORMAL LOW (ref 13.0–17.0)
Hemoglobin: 10.2 g/dL — ABNORMAL LOW (ref 13.0–17.0)
POTASSIUM: 4 mmol/L (ref 3.5–5.1)
POTASSIUM: 4.6 mmol/L (ref 3.5–5.1)
POTASSIUM: 4.6 mmol/L (ref 3.5–5.1)
POTASSIUM: 4.4 mmol/L (ref 3.5–5.1)
Potassium: 4.2 mmol/L (ref 3.5–5.1)
Potassium: 4.8 mmol/L (ref 3.5–5.1)
Potassium: 5.2 mmol/L — ABNORMAL HIGH (ref 3.5–5.1)
Potassium: 5.3 mmol/L — ABNORMAL HIGH (ref 3.5–5.1)
Potassium: 5.1 mmol/L (ref 3.5–5.1)
SODIUM: 134 mmol/L — AB (ref 135–145)
SODIUM: 136 mmol/L (ref 135–145)
SODIUM: 133 mmol/L — AB (ref 135–145)
SODIUM: 133 mmol/L — AB (ref 135–145)
SODIUM: 134 mmol/L — AB (ref 135–145)
Sodium: 134 mmol/L — ABNORMAL LOW (ref 135–145)
Sodium: 132 mmol/L — ABNORMAL LOW (ref 135–145)
Sodium: 130 mmol/L — ABNORMAL LOW (ref 135–145)
Sodium: 130 mmol/L — ABNORMAL LOW (ref 135–145)
TCO2: 22 mmol/L (ref 0–100)
TCO2: 21 mmol/L (ref 0–100)
TCO2: 23 mmol/L (ref 0–100)
TCO2: 22 mmol/L (ref 0–100)
TCO2: 23 mmol/L (ref 0–100)
TCO2: 26 mmol/L (ref 0–100)
TCO2: 25 mmol/L (ref 0–100)
TCO2: 24 mmol/L (ref 0–100)
TCO2: 24 mmol/L (ref 0–100)

## 2015-10-27 LAB — POCT I-STAT 3, ART BLOOD GAS (G3+)
ACID-BASE EXCESS: 1 mmol/L (ref 0.0–2.0)
Acid-base deficit: 2 mmol/L (ref 0.0–2.0)
Acid-base deficit: 2 mmol/L (ref 0.0–2.0)
BICARBONATE: 25.8 meq/L — AB (ref 20.0–24.0)
BICARBONATE: 26.5 meq/L — AB (ref 20.0–24.0)
BICARBONATE: 23.3 meq/L (ref 20.0–24.0)
Bicarbonate: 23.9 mEq/L (ref 20.0–24.0)
O2 SAT: 100 %
O2 SAT: 100 %
O2 SAT: 90 %
O2 Saturation: 98 %
PCO2 ART: 46 mmHg — AB (ref 35.0–45.0)
PCO2 ART: 40.2 mmHg (ref 35.0–45.0)
PH ART: 7.323 — AB (ref 7.350–7.450)
PO2 ART: 116 mmHg — AB (ref 80.0–100.0)
Patient temperature: 36.7
Patient temperature: 36.9
TCO2: 27 mmol/L (ref 0–100)
TCO2: 25 mmol/L (ref 0–100)
TCO2: 28 mmol/L (ref 0–100)
TCO2: 24 mmol/L (ref 0–100)
pCO2 arterial: 43.3 mmHg (ref 35.0–45.0)
pCO2 arterial: 50.1 mmHg — ABNORMAL HIGH (ref 35.0–45.0)
pH, Arterial: 7.382 (ref 7.350–7.450)
pH, Arterial: 7.33 — ABNORMAL LOW (ref 7.350–7.450)
pH, Arterial: 7.37 (ref 7.350–7.450)
pO2, Arterial: 277 mmHg — ABNORMAL HIGH (ref 80.0–100.0)
pO2, Arterial: 216 mmHg — ABNORMAL HIGH (ref 80.0–100.0)
pO2, Arterial: 63 mmHg — ABNORMAL LOW (ref 80.0–100.0)

## 2015-10-27 LAB — BASIC METABOLIC PANEL
Anion gap: 11 (ref 5–15)
BUN: 16 mg/dL (ref 6–20)
CO2: 22 mmol/L (ref 22–32)
Calcium: 8.8 mg/dL — ABNORMAL LOW (ref 8.9–10.3)
Chloride: 102 mmol/L (ref 101–111)
Creatinine, Ser: 1.15 mg/dL (ref 0.61–1.24)
GFR calc Af Amer: >60 mL/min (ref >60)
GFR calc non Af Amer: >60 mL/min (ref >60)
Glucose, Bld: 100 mg/dL — ABNORMAL HIGH (ref 65–99)
Potassium: 4.3 mmol/L (ref 3.5–5.1)
Sodium: 135 mmol/L (ref 135–145)

## 2015-10-27 LAB — POCT I-STAT 4, (NA,K, GLUC, HGB,HCT)
GLUCOSE: 171 mg/dL — AB (ref 65–99)
HCT: 35 % — ABNORMAL LOW (ref 39.0–52.0)
Hemoglobin: 11.9 g/dL — ABNORMAL LOW (ref 13.0–17.0)
Potassium: 4.4 mmol/L (ref 3.5–5.1)
Sodium: 135 mmol/L (ref 135–145)

## 2015-10-27 LAB — PROTIME-INR
INR: 1.59 — ABNORMAL HIGH (ref 0.00–1.49)
PROTHROMBIN TIME: 19 s — AB (ref 11.6–15.2)

## 2015-10-27 LAB — APTT: aPTT: 27 seconds (ref 24–37)

## 2015-10-27 LAB — CBC
HCT: 33.8 % — ABNORMAL LOW (ref 39.0–52.0)
HCT: 35 % — ABNORMAL LOW (ref 39.0–52.0)
HEMOGLOBIN: 10.8 g/dL — AB (ref 13.0–17.0)
HEMOGLOBIN: 11.6 g/dL — AB (ref 13.0–17.0)
MCH: 29.8 pg (ref 26.0–34.0)
MCH: 30.1 pg (ref 26.0–34.0)
MCHC: 32 g/dL (ref 30.0–36.0)
MCHC: 33.1 g/dL (ref 30.0–36.0)
MCV: 93.4 fL (ref 78.0–100.0)
MCV: 90.7 fL (ref 78.0–100.0)
PLATELETS: 153 10*3/uL (ref 150–400)
Platelets: 216 10*3/uL (ref 150–400)
RBC: 3.62 MIL/uL — AB (ref 4.22–5.81)
RBC: 3.86 MIL/uL — ABNORMAL LOW (ref 4.22–5.81)
RDW: 14.9 % (ref 11.5–15.5)
RDW: 14.9 % (ref 11.5–15.5)
WBC: 10.3 10*3/uL (ref 4.0–10.5)
WBC: 17.2 10*3/uL — ABNORMAL HIGH (ref 4.0–10.5)

## 2015-10-27 LAB — HEPARIN LEVEL (UNFRACTIONATED): Heparin Unfractionated: 0.41 IU/mL (ref 0.30–0.70)

## 2015-10-27 LAB — GLUCOSE, CAPILLARY: GLUCOSE-CAPILLARY: 164 mg/dL — AB (ref 65–99)

## 2015-10-27 LAB — PLATELET COUNT: Platelets: 228 10*3/uL (ref 150–400)

## 2015-10-27 LAB — HEMOGLOBIN AND HEMATOCRIT, BLOOD
HCT: 26.7 % — ABNORMAL LOW (ref 39.0–52.0)
Hemoglobin: 9.1 g/dL — ABNORMAL LOW (ref 13.0–17.0)

## 2015-10-27 SURGERY — CORONARY ARTERY BYPASS GRAFTING (CABG)
Anesthesia: General | Site: Neck | Laterality: Right

## 2015-10-27 MED ORDER — DEXTROSE 5 % IV SOLN
1.5000 g | Freq: Two times a day (BID) | INTRAVENOUS | Status: AC
  Administered 2015-10-28 – 2015-10-29 (×4): 1.5 g via INTRAVENOUS
  Filled 2015-10-27 (×4): qty 1.5

## 2015-10-27 MED ORDER — ALBUTEROL SULFATE (2.5 MG/3ML) 0.083% IN NEBU
2.5000 mg | INHALATION_SOLUTION | Freq: Four times a day (QID) | RESPIRATORY_TRACT | Status: DC | PRN
  Administered 2015-10-29: 2.5 mg via RESPIRATORY_TRACT
  Filled 2015-10-27: qty 3

## 2015-10-27 MED ORDER — POTASSIUM CHLORIDE 10 MEQ/50ML IV SOLN: 10.0000 meq | INTRAVENOUS | Status: AC

## 2015-10-27 MED ORDER — DEXMEDETOMIDINE HCL IN NACL 400 MCG/100ML IV SOLN
INTRAVENOUS | Status: DC | PRN
  Administered 2015-10-27: 0.7 ug/kg/h via INTRAVENOUS

## 2015-10-27 MED ORDER — FAMOTIDINE IN NACL 20-0.9 MG/50ML-% IV SOLN
20.0000 mg | Freq: Two times a day (BID) | INTRAVENOUS | Status: AC
  Administered 2015-10-27 – 2015-10-28 (×2): 20 mg via INTRAVENOUS
  Filled 2015-10-27: qty 50

## 2015-10-27 MED ORDER — ACETAMINOPHEN 650 MG RE SUPP
650.0000 mg | Freq: Once | RECTAL | Status: AC
  Administered 2015-10-27: 650 mg via RECTAL

## 2015-10-27 MED ORDER — ACETAMINOPHEN 160 MG/5ML PO SOLN
1000.0000 mg | Freq: Four times a day (QID) | ORAL | Status: AC
  Administered 2015-10-27 – 2015-11-01 (×11): 1000 mg
  Filled 2015-10-27 (×11): qty 40.6

## 2015-10-27 MED ORDER — PHENYLEPHRINE HCL 10 MG/ML IJ SOLN
0.0000 ug/min | INTRAMUSCULAR | Status: DC
  Filled 2015-10-27: qty 2

## 2015-10-27 MED ORDER — MIDAZOLAM HCL 10 MG/2ML IJ SOLN
INTRAMUSCULAR | Status: AC
  Filled 2015-10-27: qty 2

## 2015-10-27 MED ORDER — MILRINONE IN DEXTROSE 20 MG/100ML IV SOLN
0.3000 ug/kg/min | INTRAVENOUS | Status: DC
  Administered 2015-10-27 – 2015-10-28 (×3): 0.25 ug/kg/min via INTRAVENOUS
  Administered 2015-10-29 – 2015-10-30 (×3): 0.3 ug/kg/min via INTRAVENOUS
  Filled 2015-10-27 (×6): qty 100

## 2015-10-27 MED ORDER — DOCUSATE SODIUM 100 MG PO CAPS
200.0000 mg | ORAL_CAPSULE | Freq: Every day | ORAL | Status: DC
  Administered 2015-10-28 – 2015-10-29 (×2): 200 mg via ORAL
  Filled 2015-10-27 (×2): qty 2

## 2015-10-27 MED ORDER — BISACODYL 5 MG PO TBEC
10.0000 mg | DELAYED_RELEASE_TABLET | Freq: Every day | ORAL | Status: DC
  Administered 2015-10-28 – 2015-10-29 (×2): 10 mg via ORAL
  Filled 2015-10-27 (×2): qty 2

## 2015-10-27 MED ORDER — GELATIN ABSORBABLE MT POWD
OROMUCOSAL | Status: DC | PRN
  Administered 2015-10-27: 10:00:00 via TOPICAL

## 2015-10-27 MED ORDER — ALBUTEROL SULFATE HFA 108 (90 BASE) MCG/ACT IN AERS: 2.0000 | INHALATION_SPRAY | Freq: Four times a day (QID) | RESPIRATORY_TRACT | Status: DC | PRN

## 2015-10-27 MED ORDER — ACETAMINOPHEN 160 MG/5ML PO SOLN: 650.0000 mg | Freq: Once | ORAL | Status: AC

## 2015-10-27 MED ORDER — NITROGLYCERIN IN D5W 200-5 MCG/ML-% IV SOLN: 0.0000 ug/min | INTRAVENOUS | Status: DC

## 2015-10-27 MED ORDER — EPINEPHRINE HCL 1 MG/ML IJ SOLN
4000.0000 ug | INTRAVENOUS | Status: DC | PRN
  Administered 2015-10-27: 3 ug/min via INTRAVENOUS

## 2015-10-27 MED ORDER — NOREPINEPHRINE BITARTRATE 1 MG/ML IV SOLN
0.0000 ug/min | INTRAVENOUS | Status: DC
  Filled 2015-10-27: qty 4

## 2015-10-27 MED ORDER — ASPIRIN 81 MG PO CHEW: 324.0000 mg | CHEWABLE_TABLET | Freq: Every day | ORAL | Status: DC

## 2015-10-27 MED ORDER — CHLORHEXIDINE GLUCONATE 0.12% ORAL RINSE (MEDLINE KIT): 15.0000 mL | Freq: Two times a day (BID) | OROMUCOSAL | Status: DC

## 2015-10-27 MED ORDER — DEXMEDETOMIDINE HCL IN NACL 400 MCG/100ML IV SOLN
0.4000 ug/kg/h | INTRAVENOUS | Status: DC
  Administered 2015-10-27 – 2015-10-28 (×3): 0.7 ug/kg/h via INTRAVENOUS
  Filled 2015-10-27 (×3): qty 100

## 2015-10-27 MED ORDER — CHLORHEXIDINE GLUCONATE 0.12% ORAL RINSE (MEDLINE KIT)
15.0000 mL | Freq: Two times a day (BID) | OROMUCOSAL | Status: DC
  Administered 2015-10-27 – 2015-10-28 (×2): 15 mL via OROMUCOSAL

## 2015-10-27 MED ORDER — FENTANYL CITRATE (PF) 250 MCG/5ML IJ SOLN
INTRAMUSCULAR | Status: AC
  Filled 2015-10-27: qty 5

## 2015-10-27 MED ORDER — SODIUM CHLORIDE 0.9 % IJ SOLN
OROMUCOSAL | Status: DC | PRN
  Administered 2015-10-27: 10:00:00 via TOPICAL

## 2015-10-27 MED ORDER — MIDAZOLAM HCL 5 MG/5ML IJ SOLN
INTRAMUSCULAR | Status: DC | PRN
  Administered 2015-10-27: 2 mg via INTRAVENOUS
  Administered 2015-10-27: 5 mg via INTRAVENOUS
  Administered 2015-10-27: 3 mg via INTRAVENOUS
  Administered 2015-10-27: 2 mg via INTRAVENOUS

## 2015-10-27 MED ORDER — ACETAMINOPHEN 500 MG PO TABS
1000.0000 mg | ORAL_TABLET | Freq: Four times a day (QID) | ORAL | Status: AC
  Administered 2015-10-28 – 2015-10-30 (×6): 1000 mg via ORAL
  Filled 2015-10-27 (×7): qty 2

## 2015-10-27 MED ORDER — ANTISEPTIC ORAL RINSE SOLUTION (CORINZ): 7.0000 mL | Freq: Four times a day (QID) | OROMUCOSAL | Status: DC

## 2015-10-27 MED ORDER — FENTANYL CITRATE (PF) 100 MCG/2ML IJ SOLN
INTRAMUSCULAR | Status: DC | PRN
  Administered 2015-10-27: 50 ug via INTRAVENOUS
  Administered 2015-10-27: 100 ug via INTRAVENOUS
  Administered 2015-10-27: 500 ug via INTRAVENOUS
  Administered 2015-10-27 (×4): 100 ug via INTRAVENOUS
  Administered 2015-10-27: 50 ug via INTRAVENOUS
  Administered 2015-10-27: 150 ug via INTRAVENOUS
  Administered 2015-10-27: 100 ug via INTRAVENOUS
  Administered 2015-10-27 (×2): 150 ug via INTRAVENOUS
  Administered 2015-10-27: 100 ug via INTRAVENOUS

## 2015-10-27 MED ORDER — PHENYLEPHRINE HCL 10 MG/ML IJ SOLN
10.0000 mg | INTRAMUSCULAR | Status: DC | PRN
  Administered 2015-10-27: 25 ug/min via INTRAVENOUS

## 2015-10-27 MED ORDER — FUROSEMIDE 10 MG/ML IJ SOLN
INTRAMUSCULAR | Status: DC | PRN
  Administered 2015-10-27: 20 mg via INTRAMUSCULAR

## 2015-10-27 MED ORDER — METOPROLOL TARTRATE 12.5 MG HALF TABLET
12.5000 mg | ORAL_TABLET | Freq: Two times a day (BID) | ORAL | Status: DC
Start: 2015-10-27 — End: 2015-10-28

## 2015-10-27 MED ORDER — HEPARIN SODIUM (PORCINE) 1000 UNIT/ML IJ SOLN
INTRAMUSCULAR | Status: DC | PRN
  Administered 2015-10-27: 12000 [IU] via INTRAVENOUS
  Administered 2015-10-27: 2000 [IU] via INTRAVENOUS
  Administered 2015-10-27: 4000 [IU] via INTRAVENOUS
  Administered 2015-10-27: 36000 [IU] via INTRAVENOUS

## 2015-10-27 MED ORDER — SODIUM CHLORIDE 0.9% FLUSH: 3.0000 mL | INTRAVENOUS | Status: DC | PRN

## 2015-10-27 MED ORDER — SODIUM CHLORIDE 0.9 % IV SOLN: 250.0000 mL | INTRAVENOUS | Status: DC

## 2015-10-27 MED ORDER — SODIUM CHLORIDE 0.9 % IV SOLN
INTRAVENOUS | Status: DC | PRN
  Administered 2015-10-27: 500 mL

## 2015-10-27 MED ORDER — VECURONIUM BROMIDE 10 MG IV SOLR
INTRAVENOUS | Status: DC | PRN
  Administered 2015-10-27 (×5): 4 mg via INTRAVENOUS

## 2015-10-27 MED ORDER — LEVALBUTEROL HCL 1.25 MG/0.5ML IN NEBU
1.2500 mg | INHALATION_SOLUTION | Freq: Four times a day (QID) | RESPIRATORY_TRACT | Status: DC
Start: 2015-10-27 — End: 2015-10-29
  Administered 2015-10-27 – 2015-10-29 (×5): 1.25 mg via RESPIRATORY_TRACT
  Filled 2015-10-27 (×6): qty 0.5

## 2015-10-27 MED ORDER — ALBUMIN HUMAN 5 % IV SOLN
250.0000 mL | INTRAVENOUS | Status: AC | PRN
  Administered 2015-10-27 – 2015-10-28 (×4): 250 mL via INTRAVENOUS
  Filled 2015-10-27: qty 250

## 2015-10-27 MED ORDER — DEXMEDETOMIDINE HCL IN NACL 200 MCG/50ML IV SOLN
0.0000 ug/kg/h | INTRAVENOUS | Status: DC
  Filled 2015-10-27: qty 50

## 2015-10-27 MED ORDER — SODIUM CHLORIDE 0.9 % IV SOLN
10.0000 g | INTRAVENOUS | Status: DC | PRN
  Administered 2015-10-27: 5 g/h via INTRAVENOUS

## 2015-10-27 MED ORDER — INSULIN REGULAR BOLUS VIA INFUSION
0.0000 [IU] | Freq: Three times a day (TID) | INTRAVENOUS | Status: DC
  Filled 2015-10-27: qty 10

## 2015-10-27 MED ORDER — SODIUM CHLORIDE 0.9 % IJ SOLN
INTRAMUSCULAR | Status: DC | PRN
  Administered 2015-10-27: 10:00:00 via TOPICAL

## 2015-10-27 MED ORDER — TRAMADOL HCL 50 MG PO TABS
50.0000 mg | ORAL_TABLET | ORAL | Status: DC | PRN
  Administered 2015-11-16: 50 mg via ORAL
  Filled 2015-10-27: qty 2

## 2015-10-27 MED ORDER — SODIUM CHLORIDE 0.9% FLUSH
3.0000 mL | Freq: Two times a day (BID) | INTRAVENOUS | Status: DC
  Administered 2015-10-28 – 2015-10-30 (×4): 3 mL via INTRAVENOUS

## 2015-10-27 MED ORDER — VANCOMYCIN HCL IN DEXTROSE 1-5 GM/200ML-% IV SOLN
1000.0000 mg | Freq: Once | INTRAVENOUS | Status: DC
  Filled 2015-10-27: qty 200

## 2015-10-27 MED ORDER — METOCLOPRAMIDE HCL 5 MG/ML IJ SOLN
10.0000 mg | Freq: Four times a day (QID) | INTRAMUSCULAR | Status: AC
  Administered 2015-10-27 – 2015-10-28 (×3): 10 mg via INTRAVENOUS
  Filled 2015-10-27 (×3): qty 2

## 2015-10-27 MED ORDER — LEVALBUTEROL HCL 1.25 MG/0.5ML IN NEBU: 1.2500 mg | INHALATION_SOLUTION | Freq: Four times a day (QID) | RESPIRATORY_TRACT | Status: DC

## 2015-10-27 MED ORDER — PROPOFOL 10 MG/ML IV BOLUS
INTRAVENOUS | Status: AC
  Filled 2015-10-27: qty 40

## 2015-10-27 MED ORDER — METOPROLOL TARTRATE 1 MG/ML IV SOLN: 2.5000 mg | INTRAVENOUS | Status: DC | PRN

## 2015-10-27 MED ORDER — LACTATED RINGERS IV SOLN
INTRAVENOUS | Status: DC | PRN
  Administered 2015-10-27 (×2): via INTRAVENOUS

## 2015-10-27 MED ORDER — ANTISEPTIC ORAL RINSE SOLUTION (CORINZ)
7.0000 mL | OROMUCOSAL | Status: DC
  Administered 2015-10-27 – 2015-10-28 (×7): 7 mL via OROMUCOSAL

## 2015-10-27 MED ORDER — HEMOSTATIC AGENTS (NO CHARGE) OPTIME
TOPICAL | Status: DC | PRN
  Administered 2015-10-27 (×2): 1 via TOPICAL

## 2015-10-27 MED ORDER — PROTAMINE SULFATE 10 MG/ML IV SOLN
INTRAVENOUS | Status: DC | PRN
  Administered 2015-10-27: 50 mg via INTRAVENOUS
  Administered 2015-10-27: 350 mg via INTRAVENOUS

## 2015-10-27 MED ORDER — 0.9 % SODIUM CHLORIDE (POUR BTL) OPTIME
TOPICAL | Status: DC | PRN
  Administered 2015-10-27: 6000 mL

## 2015-10-27 MED ORDER — DOPAMINE-DEXTROSE 1.6-5 MG/ML-% IV SOLN
INTRAVENOUS | Status: DC | PRN
  Administered 2015-10-27: 3 ug/kg/min via INTRAVENOUS

## 2015-10-27 MED ORDER — SODIUM CHLORIDE 0.45 % IV SOLN
INTRAVENOUS | Status: DC | PRN
  Administered 2015-10-28: 20 mL/h via INTRAVENOUS

## 2015-10-27 MED ORDER — LACTATED RINGERS IV SOLN
INTRAVENOUS | Status: DC | PRN
  Administered 2015-10-27 (×5): via INTRAVENOUS

## 2015-10-27 MED ORDER — BISACODYL 10 MG RE SUPP
10.0000 mg | Freq: Every day | RECTAL | Status: DC
  Administered 2015-11-01 – 2015-11-04 (×4): 10 mg via RECTAL
  Filled 2015-10-27 (×5): qty 1

## 2015-10-27 MED ORDER — SODIUM CHLORIDE 0.9 % IV SOLN
INTRAVENOUS | Status: DC
  Administered 2015-10-27: 20:00:00 via INTRAVENOUS
  Filled 2015-10-27: qty 2.5

## 2015-10-27 MED ORDER — VANCOMYCIN HCL IN DEXTROSE 1-5 GM/200ML-% IV SOLN
1000.0000 mg | Freq: Two times a day (BID) | INTRAVENOUS | Status: AC
  Administered 2015-10-27 – 2015-10-28 (×3): 1000 mg via INTRAVENOUS
  Filled 2015-10-27 (×3): qty 200

## 2015-10-27 MED ORDER — MAGNESIUM SULFATE 4 GM/100ML IV SOLN
4.0000 g | Freq: Once | INTRAVENOUS | Status: AC
  Administered 2015-10-27: 4 g via INTRAVENOUS
  Filled 2015-10-27: qty 100

## 2015-10-27 MED ORDER — NOREPINEPHRINE BITARTRATE 1 MG/ML IV SOLN
0.0000 ug/min | INTRAVENOUS | Status: DC
  Administered 2015-10-28: 3 ug/min via INTRAVENOUS
  Administered 2015-10-30: 1 ug/min via INTRAVENOUS
  Filled 2015-10-27 (×3): qty 16

## 2015-10-27 MED ORDER — DEXTRAN 40 IN SALINE 10-0.9 % IV SOLN
INTRAVENOUS | Status: DC | PRN
Start: 2015-10-27 — End: 2015-10-27
  Administered 2015-10-27: 500 mL

## 2015-10-27 MED ORDER — LACTATED RINGERS IV SOLN: 500.0000 mL | Freq: Once | INTRAVENOUS | Status: DC | PRN

## 2015-10-27 MED ORDER — MORPHINE SULFATE (PF) 2 MG/ML IV SOLN
2.0000 mg | INTRAVENOUS | Status: DC | PRN
  Administered 2015-10-28 (×6): 2 mg via INTRAVENOUS
  Administered 2015-10-29 (×3): 4 mg via INTRAVENOUS
  Administered 2015-10-29: 2 mg via INTRAVENOUS
  Administered 2015-10-29 (×2): 4 mg via INTRAVENOUS
  Administered 2015-10-29: 2 mg via INTRAVENOUS
  Administered 2015-10-30: 4 mg via INTRAVENOUS
  Administered 2015-11-02: 2 mg via INTRAVENOUS
  Filled 2015-10-27: qty 1
  Filled 2015-10-27: qty 2
  Filled 2015-10-27 (×2): qty 1
  Filled 2015-10-27 (×3): qty 2
  Filled 2015-10-27 (×4): qty 1
  Filled 2015-10-27 (×3): qty 2

## 2015-10-27 MED ORDER — METOPROLOL TARTRATE 25 MG/10 ML ORAL SUSPENSION: 12.5000 mg | Freq: Two times a day (BID) | ORAL | Status: DC

## 2015-10-27 MED ORDER — NOREPINEPHRINE BITARTRATE 1 MG/ML IV SOLN
0.0000 ug/min | Freq: Once | INTRAVENOUS | Status: AC
  Administered 2015-10-27: 3 ug/min via INTRAVENOUS
  Filled 2015-10-27: qty 16

## 2015-10-27 MED ORDER — ONDANSETRON HCL 4 MG/2ML IJ SOLN
4.0000 mg | Freq: Four times a day (QID) | INTRAMUSCULAR | Status: DC | PRN
  Administered 2015-11-14 – 2015-11-24 (×3): 4 mg via INTRAVENOUS
  Filled 2015-10-27 (×3): qty 2

## 2015-10-27 MED ORDER — LACTATED RINGERS IV SOLN: INTRAVENOUS | Status: DC

## 2015-10-27 MED ORDER — ASPIRIN EC 325 MG PO TBEC
325.0000 mg | DELAYED_RELEASE_TABLET | Freq: Every day | ORAL | Status: DC
  Administered 2015-10-28 – 2015-10-29 (×2): 325 mg via ORAL
  Filled 2015-10-27 (×2): qty 1

## 2015-10-27 MED ORDER — EPINEPHRINE HCL 1 MG/ML IJ SOLN
0.0000 ug/min | INTRAMUSCULAR | Status: DC
  Administered 2015-10-28 – 2015-10-29 (×2): 3 ug/min via INTRAVENOUS
  Filled 2015-10-27 (×3): qty 4

## 2015-10-27 MED ORDER — MOMETASONE FURO-FORMOTEROL FUM 200-5 MCG/ACT IN AERO
2.0000 | INHALATION_SPRAY | Freq: Two times a day (BID) | RESPIRATORY_TRACT | Status: DC
  Administered 2015-10-28 – 2015-10-29 (×3): 2 via RESPIRATORY_TRACT
  Filled 2015-10-27: qty 8.8

## 2015-10-27 MED ORDER — CHLORHEXIDINE GLUCONATE 0.12 % MT SOLN
OROMUCOSAL | Status: AC
  Administered 2015-10-27: 15 mL
  Filled 2015-10-27: qty 15

## 2015-10-27 MED ORDER — ROCURONIUM BROMIDE 100 MG/10ML IV SOLN
INTRAVENOUS | Status: DC | PRN
  Administered 2015-10-27 (×4): 50 mg via INTRAVENOUS

## 2015-10-27 MED ORDER — MORPHINE SULFATE (PF) 2 MG/ML IV SOLN
1.0000 mg | INTRAVENOUS | Status: DC | PRN
  Administered 2015-10-27 – 2015-10-28 (×4): 2 mg via INTRAVENOUS
  Filled 2015-10-27 (×4): qty 1

## 2015-10-27 MED ORDER — ALBUTEROL SULFATE HFA 108 (90 BASE) MCG/ACT IN AERS
INHALATION_SPRAY | RESPIRATORY_TRACT | Status: DC | PRN
  Administered 2015-10-27: 8 via RESPIRATORY_TRACT

## 2015-10-27 MED ORDER — SODIUM CHLORIDE 0.9 % IV SOLN
0.0300 [IU]/min | Freq: Once | INTRAVENOUS | Status: DC
  Filled 2015-10-27: qty 2

## 2015-10-27 MED ORDER — OXYCODONE HCL 5 MG PO TABS
5.0000 mg | ORAL_TABLET | ORAL | Status: DC | PRN
  Administered 2015-10-28 – 2015-10-29 (×2): 5 mg via ORAL
  Filled 2015-10-27 (×2): qty 1

## 2015-10-27 MED ORDER — CHLORHEXIDINE GLUCONATE 0.12 % MT SOLN
15.0000 mL | OROMUCOSAL | Status: AC
  Administered 2015-10-27: 15 mL via OROMUCOSAL

## 2015-10-27 MED ORDER — MIDAZOLAM HCL 2 MG/2ML IJ SOLN
2.0000 mg | INTRAMUSCULAR | Status: DC | PRN
  Administered 2015-10-27 – 2015-11-02 (×11): 2 mg via INTRAVENOUS
  Filled 2015-10-27 (×13): qty 2

## 2015-10-27 MED ORDER — MILRINONE IN DEXTROSE 20 MG/100ML IV SOLN
0.1250 ug/kg/min | INTRAVENOUS | Status: AC
  Administered 2015-10-27: .25 ug/kg/min via INTRAVENOUS
  Filled 2015-10-27: qty 100

## 2015-10-27 MED ORDER — PANTOPRAZOLE SODIUM 40 MG PO TBEC
40.0000 mg | DELAYED_RELEASE_TABLET | Freq: Every day | ORAL | Status: DC
Start: 2015-10-29 — End: 2015-10-30
  Administered 2015-10-29: 40 mg via ORAL
  Filled 2015-10-27: qty 1

## 2015-10-27 MED ORDER — MIDAZOLAM HCL 2 MG/2ML IJ SOLN
INTRAMUSCULAR | Status: AC
  Filled 2015-10-27: qty 2

## 2015-10-27 MED ORDER — HEPARIN SODIUM (PORCINE) 1000 UNIT/ML IJ SOLN
INTRAMUSCULAR | Status: AC
  Filled 2015-10-27: qty 4

## 2015-10-27 MED ORDER — DOPAMINE-DEXTROSE 3.2-5 MG/ML-% IV SOLN
3.0000 ug/kg/min | INTRAVENOUS | Status: DC
  Administered 2015-10-28: 3 ug/kg/min via INTRAVENOUS
  Filled 2015-10-27: qty 250

## 2015-10-27 MED ORDER — PLASMA-LYTE 148 IV SOLN
INTRAVENOUS | Status: DC | PRN
  Administered 2015-10-27: 500 mL

## 2015-10-27 MED ORDER — PLASMA-LYTE 148 IV SOLN
INTRAVENOUS | Status: AC
  Administered 2015-10-27: 500 mL
  Filled 2015-10-27: qty 2.5

## 2015-10-27 MED ORDER — DEXTRAN 40 IN SALINE 10-0.9 % IV SOLN
INTRAVENOUS | Status: AC
  Filled 2015-10-27: qty 500

## 2015-10-27 MED ORDER — FENTANYL CITRATE (PF) 250 MCG/5ML IJ SOLN
INTRAMUSCULAR | Status: AC
  Filled 2015-10-27: qty 25

## 2015-10-27 MED ORDER — SODIUM CHLORIDE 0.9 % IV SOLN
INTRAVENOUS | Status: DC
Start: 2015-10-27 — End: 2015-11-02
  Administered 2015-10-30: 10 mL/h via INTRAVENOUS
  Administered 2015-11-01: 18:00:00 via INTRAVENOUS

## 2015-10-27 SURGICAL SUPPLY — 145 items
ADAPTER CARDIO PERF ANTE/RETRO (ADAPTER) ×5 IMPLANT
BAG DECANTER FOR FLEXI CONT (MISCELLANEOUS) ×10 IMPLANT
BALLN LINEAR 7.5FR IABP 34CC (BALLOONS) ×5
BALLOON LINEAR 7.5FR IABP 34CC (BALLOONS) ×3 IMPLANT
BANDAGE ACE 4X5 VEL STRL LF (GAUZE/BANDAGES/DRESSINGS) ×5 IMPLANT
BANDAGE ACE 6X5 VEL STRL LF (GAUZE/BANDAGES/DRESSINGS) ×5 IMPLANT
BANDAGE ELASTIC 4 VELCRO ST LF (GAUZE/BANDAGES/DRESSINGS) ×5 IMPLANT
BANDAGE ELASTIC 6 VELCRO ST LF (GAUZE/BANDAGES/DRESSINGS) ×5 IMPLANT
BASKET HEART  (ORDER IN 25'S) (MISCELLANEOUS) ×1
BASKET HEART (ORDER IN 25'S) (MISCELLANEOUS) ×1
BASKET HEART (ORDER IN 25S) (MISCELLANEOUS) ×3 IMPLANT
BLADE NEEDLE 3 SS STRL (BLADE) ×4 IMPLANT
BLADE NEEDLE 3MM SS STRL (BLADE) ×1
BLADE STERNUM SYSTEM 6 (BLADE) ×5 IMPLANT
BLADE SURG 11 STRL SS (BLADE) ×5 IMPLANT
BLADE SURG 12 STRL SS (BLADE) ×5 IMPLANT
BLADE SURG ROTATE 9660 (MISCELLANEOUS) ×5 IMPLANT
BLOOD HAEMOCONCENTR 700 MIDI (MISCELLANEOUS) ×5 IMPLANT
BNDG GAUZE ELAST 4 BULKY (GAUZE/BANDAGES/DRESSINGS) ×5 IMPLANT
BOOT SUTURE AID YELLOW STND (SUTURE) ×5 IMPLANT
CANISTER SUCTION 2500CC (MISCELLANEOUS) ×10 IMPLANT
CANNULA ARTERIAL NVNT 3/8 22FR (MISCELLANEOUS) ×5 IMPLANT
CANNULA GUNDRY RCSP 15FR (MISCELLANEOUS) ×5 IMPLANT
CATH CPB KIT VANTRIGT (MISCELLANEOUS) ×5 IMPLANT
CATH ROBINSON RED A/P 18FR (CATHETERS) ×25 IMPLANT
CATH SUCT 10FR WHISTLE TIP (CATHETERS) IMPLANT
CATH THORACIC 36FR RT ANG (CATHETERS) ×5 IMPLANT
CLIP FOGARTY SPRING 6M (CLIP) ×5 IMPLANT
CLIP RETRACTION 3.0MM CORONARY (MISCELLANEOUS) ×5 IMPLANT
CLIP TI MEDIUM 6 (CLIP) IMPLANT
CLIP TI WIDE RED SMALL 24 (CLIP) ×5 IMPLANT
CLIP TI WIDE RED SMALL 6 (CLIP) IMPLANT
COVER MAYO STAND STRL (DRAPES) ×5 IMPLANT
COVER PROBE W GEL 5X96 (DRAPES) IMPLANT
COVER SURGICAL LIGHT HANDLE (MISCELLANEOUS) ×5 IMPLANT
CRADLE DONUT ADULT HEAD (MISCELLANEOUS) ×5 IMPLANT
DRAIN CHANNEL 32F RND 10.7 FF (WOUND CARE) ×5 IMPLANT
DRAIN TLS ROUND 10FR (DRAIN) ×5 IMPLANT
DRAPE CARDIOVASCULAR INCISE (DRAPES) ×2
DRAPE SLUSH/WARMER DISC (DRAPES) ×5 IMPLANT
DRAPE SRG 135X102X78XABS (DRAPES) ×3 IMPLANT
DRSG AQUACEL AG ADV 3.5X14 (GAUZE/BANDAGES/DRESSINGS) ×5 IMPLANT
ELECT BLADE 4.0 EZ CLEAN MEGAD (MISCELLANEOUS) ×5
ELECT BLADE 6.5 EXT (BLADE) ×5 IMPLANT
ELECT CAUTERY BLADE 6.4 (BLADE) ×5 IMPLANT
ELECT REM PT RETURN 9FT ADLT (ELECTROSURGICAL) ×15
ELECTRODE BLDE 4.0 EZ CLN MEGD (MISCELLANEOUS) ×3 IMPLANT
ELECTRODE REM PT RTRN 9FT ADLT (ELECTROSURGICAL) ×9 IMPLANT
FELT TEFLON 1X6 (MISCELLANEOUS) ×5 IMPLANT
GAUZE SPONGE 4X4 12PLY STRL (GAUZE/BANDAGES/DRESSINGS) ×20 IMPLANT
GAUZE SPONGE 4X4 16PLY XRAY LF (GAUZE/BANDAGES/DRESSINGS) ×5 IMPLANT
GLOVE BIO SURGEON STRL SZ7 (GLOVE) ×15 IMPLANT
GLOVE BIO SURGEON STRL SZ7.5 (GLOVE) ×30 IMPLANT
GLOVE BIOGEL PI IND STRL 6 (GLOVE) ×12 IMPLANT
GLOVE BIOGEL PI IND STRL 7.5 (GLOVE) ×3 IMPLANT
GLOVE BIOGEL PI INDICATOR 6 (GLOVE) ×8
GLOVE BIOGEL PI INDICATOR 7.5 (GLOVE) ×2
GOWN STRL REUS W/ TWL LRG LVL3 (GOWN DISPOSABLE) ×21 IMPLANT
GOWN STRL REUS W/TWL LRG LVL3 (GOWN DISPOSABLE) ×14
HEMOSTAT POWDER SURGIFOAM 1G (HEMOSTASIS) ×15 IMPLANT
HEMOSTAT SPONGE AVITENE ULTRA (HEMOSTASIS) IMPLANT
HEMOSTAT SURGICEL 2X14 (HEMOSTASIS) ×5 IMPLANT
INSERT FOGARTY XLG (MISCELLANEOUS) IMPLANT
IV ADAPTER SYR DOUBLE MALE LL (MISCELLANEOUS) IMPLANT
KIT BASIN OR (CUSTOM PROCEDURE TRAY) ×10 IMPLANT
KIT ROOM TURNOVER OR (KITS) ×10 IMPLANT
KIT SHUNT ARGYLE CAROTID ART 6 (VASCULAR PRODUCTS) ×5 IMPLANT
KIT SUCTION CATH 14FR (SUCTIONS) ×5 IMPLANT
KIT VASOVIEW 6 PRO VH 2400 (KITS) ×5 IMPLANT
LEAD PACING MYOCARDI (MISCELLANEOUS) ×5 IMPLANT
LIQUID BAND (GAUZE/BANDAGES/DRESSINGS) ×5 IMPLANT
LOOP VESSEL MAXI BLUE (MISCELLANEOUS) ×5 IMPLANT
MARKER GRAFT CORONARY BYPASS (MISCELLANEOUS) ×15 IMPLANT
NS IRRIG 1000ML POUR BTL (IV SOLUTION) ×40 IMPLANT
PACK CAROTID (CUSTOM PROCEDURE TRAY) IMPLANT
PACK OPEN HEART (CUSTOM PROCEDURE TRAY) ×5 IMPLANT
PAD ARMBOARD 7.5X6 YLW CONV (MISCELLANEOUS) ×10 IMPLANT
PAD ELECT DEFIB RADIOL ZOLL (MISCELLANEOUS) ×5 IMPLANT
PATCH VASC XENOSURE 1CMX6CM (Vascular Products) ×2 IMPLANT
PATCH VASC XENOSURE 1X6 (Vascular Products) ×3 IMPLANT
PENCIL BUTTON HOLSTER BLD 10FT (ELECTRODE) ×10 IMPLANT
PUNCH AORTIC ROTATE 4.0MM (MISCELLANEOUS) ×5 IMPLANT
PUNCH AORTIC ROTATE 4.5MM 8IN (MISCELLANEOUS) IMPLANT
PUNCH AORTIC ROTATE 5MM 8IN (MISCELLANEOUS) IMPLANT
SET CARDIOPLEGIA MPS 5001102 (MISCELLANEOUS) ×5 IMPLANT
SET COLLECT BLD 21X3/4 12 PB (MISCELLANEOUS) IMPLANT
SHUNT CAROTID BYPASS 10 (VASCULAR PRODUCTS) IMPLANT
SHUNT CAROTID BYPASS 12FRX15.5 (VASCULAR PRODUCTS) IMPLANT
SOLUTION ANTI FOG 6CC (MISCELLANEOUS) ×5 IMPLANT
SPONGE GAUZE 4X4 12PLY STER LF (GAUZE/BANDAGES/DRESSINGS) ×10 IMPLANT
SPONGE INTESTINAL PEANUT (DISPOSABLE) ×10 IMPLANT
SPONGE LAP 18X18 X RAY DECT (DISPOSABLE) ×5 IMPLANT
SPONGE LAP 4X18 X RAY DECT (DISPOSABLE) ×5 IMPLANT
STOPCOCK 4 WAY LG BORE MALE ST (IV SETS) ×5 IMPLANT
SURGIFLO W/THROMBIN 8M KIT (HEMOSTASIS) ×5 IMPLANT
SUT BONE WAX W31G (SUTURE) ×5 IMPLANT
SUT ETHILON 3 0 FSL (SUTURE) ×5 IMPLANT
SUT ETHILON 3 0 PS 1 (SUTURE) IMPLANT
SUT MNCRL AB 4-0 PS2 18 (SUTURE) ×10 IMPLANT
SUT PROLENE 3 0 SH DA (SUTURE) IMPLANT
SUT PROLENE 3 0 SH1 36 (SUTURE) IMPLANT
SUT PROLENE 4 0 RB 1 (SUTURE) ×2
SUT PROLENE 4 0 SH DA (SUTURE) ×5 IMPLANT
SUT PROLENE 4-0 RB1 .5 CRCL 36 (SUTURE) ×3 IMPLANT
SUT PROLENE 5 0 C 1 36 (SUTURE) IMPLANT
SUT PROLENE 6 0 BV (SUTURE) ×15 IMPLANT
SUT PROLENE 6 0 C 1 30 (SUTURE) ×40 IMPLANT
SUT PROLENE 6 0 CC (SUTURE) ×15 IMPLANT
SUT PROLENE 7 0 BV 1 (SUTURE) IMPLANT
SUT PROLENE 8 0 BV175 6 (SUTURE) ×10 IMPLANT
SUT PROLENE BLUE 7 0 (SUTURE) ×15 IMPLANT
SUT SILK  1 MH (SUTURE)
SUT SILK 1 MH (SUTURE) IMPLANT
SUT SILK 2 0 SH CR/8 (SUTURE) IMPLANT
SUT SILK 3 0 (SUTURE) ×2
SUT SILK 3 0 SH CR/8 (SUTURE) ×5 IMPLANT
SUT SILK 3 0 TIES 10X30 (SUTURE) ×5 IMPLANT
SUT SILK 3 0 TIES 17X18 (SUTURE)
SUT SILK 3-0 18XBRD TIE 12 (SUTURE) ×3 IMPLANT
SUT SILK 3-0 18XBRD TIE BLK (SUTURE) IMPLANT
SUT STEEL 6MS V (SUTURE) ×10 IMPLANT
SUT STEEL SZ 6 DBL 3X14 BALL (SUTURE) ×5 IMPLANT
SUT VIC AB 1 CTX 36 (SUTURE) ×6
SUT VIC AB 1 CTX36XBRD ANBCTR (SUTURE) ×9 IMPLANT
SUT VIC AB 2-0 CT1 27 (SUTURE) ×2
SUT VIC AB 2-0 CT1 TAPERPNT 27 (SUTURE) ×3 IMPLANT
SUT VIC AB 2-0 CTX 27 (SUTURE) IMPLANT
SUT VIC AB 3-0 SH 27 (SUTURE) ×2
SUT VIC AB 3-0 SH 27X BRD (SUTURE) ×3 IMPLANT
SUT VIC AB 3-0 X1 27 (SUTURE) IMPLANT
SUTURE E-PAK OPEN HEART (SUTURE) ×5 IMPLANT
SYR TB 1ML LUER SLIP (SYRINGE) IMPLANT
SYSTEM CHEST DRAIN TLS 7FR (DRAIN) IMPLANT
SYSTEM SAHARA CHEST DRAIN ATS (WOUND CARE) ×5 IMPLANT
TAPE CLOTH SURG 4X10 WHT LF (GAUZE/BANDAGES/DRESSINGS) ×5 IMPLANT
TAPE PAPER 3X10 WHT MICROPORE (GAUZE/BANDAGES/DRESSINGS) ×5 IMPLANT
TOWEL OR 17X24 6PK STRL BLUE (TOWEL DISPOSABLE) ×10 IMPLANT
TOWEL OR 17X26 10 PK STRL BLUE (TOWEL DISPOSABLE) ×15 IMPLANT
TRAY CATH LUMEN 1 20CM STRL (SET/KITS/TRAYS/PACK) ×5 IMPLANT
TRAY FOLEY IC TEMP SENS 16FR (CATHETERS) ×5 IMPLANT
TUBING ART PRESS 48 MALE/FEM (TUBING) ×10 IMPLANT
TUBING EXTENTION W/L.L. (IV SETS) IMPLANT
TUBING INSUFFLATION (TUBING) ×5 IMPLANT
UNDERPAD 30X30 INCONTINENT (UNDERPADS AND DIAPERS) ×5 IMPLANT
WATER STERILE IRR 1000ML POUR (IV SOLUTION) ×15 IMPLANT

## 2015-10-27 NOTE — Progress Notes (Signed)
Post-op CABGX4 with IABP. Several RNs tried to obtain pulse in Right leg where IABP is placed. Unable to doppler pulse in Right let, Dr. Donata Clay and Dr. Cornelius Moras aware. Orders obtained to discontinue Ace wrap and apply warm compresses. Will recheck again and continue to monitor closely.  Domenica Fail, RN

## 2015-10-27 NOTE — Progress Notes (Signed)
Pt transported at this time on vent and Nitric at 10ppm with OR team without complications.

## 2015-10-27 NOTE — Progress Notes (Signed)
The patient was examined and preop studies reviewed. There has been no change from the prior exam and the patient is ready for surgery.  Plan CABG on G Timm for CAD, CHF, non STEMI with LV dysfunction

## 2015-10-27 NOTE — Progress Notes (Signed)
Echocardiogram Echocardiogram Transesophageal has been performed.  Dorothey Baseman 10/27/2015, 8:33 AM

## 2015-10-27 NOTE — Brief Op Note (Addendum)
10/20/2015 - 10/27/2015  2:44 PM  PATIENT:  Christopher [name]  55 y.o. male  PRE-OPERATIVE DIAGNOSIS:  CAD RIGHT CAROTID ARTERY STENOSIS  POST-OPERATIVE DIAGNOSIS:  CAD RIGHT CAROTID ARTERY STENOSIS  PROCEDURE:  Procedure(s):  CORONARY ARTERY BYPASS GRAFTING x 4 -LIMA to LAD -SVG to DIAGONAL -SVG to RAMUS INTERMEDIATE -SVG to RDA  ENDOSCOPIC HARVEST GREATER SAPHENOUS VEIN -Right Leg  PLACEMENT OF FEMORAL ARTERIAL LINE PLACEMENT OF IABP VIA R FEMORAL ARTERY   TRANSESOPHAGEAL ECHOCARDIOGRAM (TEE) (N/A)  RIGHT ENDARTERECTOMY CAROTID with patch angioplasty using Xenosure bovine pericardium patch (Right)  SURGEON:  Surgeon(s) and Role: Panel 1:    * Kerin Perna, MD - Primary  Panel 2:    * Fransisco Hertz, MD - Primary  PHYSICIAN ASSISTANT: Lowella Dandy PA-C, Maris Berger PA-C  ANESTHESIA:   general  EBL:  Total I/O In: 1000 [I.V.:1000] Out: 1465 [Urine:1465]  BLOOD ADMINISTERED: CELLSAVER and 2 FFP  DRAINS: Left Pleural Chest Tubes, Mediastinal Chest Drains, Drain Right Neck   LOCAL MEDICATIONS USED:  NONE  SPECIMEN:  Source of Specimen:  Right Carotid Plaque  DISPOSITION OF SPECIMEN:  PATHOLOGY  COUNTS:  YES  TOURNIQUET:  * No tourniquets in log *  DICTATION: .Dragon Dictation  PLAN OF CARE: Admit to inpatient   PATIENT DISPOSITION:  ICU - intubated and hemodynamically stable.   Delay start of Pharmacological VTE agent (>24hrs) due to surgical blood loss or risk of bleeding: yes

## 2015-10-27 NOTE — Op Note (Addendum)
OPERATIVE NOTE  PROCEDURE:   1.  right carotid endarterectomy with bovine patch angioplasty 2.  right intraoperative carotid ultrasound  PRE-OPERATIVE DIAGNOSIS: right asymptomatic carotid stenosis >80%  POST-OPERATIVE DIAGNOSIS: same as above   SURGEON: Leonides Sake, MD  ASSISTANT(S): Karsten Ro, PAC   ANESTHESIA: general  ESTIMATED BLOOD LOSS: 100 cc  FINDING(S): 1.  Limited backbleeding from the internal carotid artery prior to endarterectomy and after endarterectomy 2.  Continuous Doppler audible flow signatures are appropriate for each carotid artery. 3.  No evidence of intimal flap visualized on transverse or longitudinal ultrasonography. 4.  Near lumen occluding soft carotid plaque.  SPECIMEN(S):  none  INDICATIONS:   Christopher [name] is a 55 y.o. male who presents with right asymptomatic carotid stenosis >80% in the setting of severe coronary artery disease.  Dr. Arbie Cookey and Dr. Donata Clay had decided to proceed with a combined Right carotid endarterectomy and CABG.  I agreed to perform the procedure as Dr. Arbie Cookey would not be available for the procedure.  I discussed with the patient the risks, benefits, and alternatives to carotid endarterectomy.  I discussed the procedural details of carotid endarterectomy with the patient.  The patient is aware that the risks of carotid endarterectomy include but are not limited to: bleeding, infection, stroke, myocardial infarction, death, cranial nerve injuries both temporary and permanent, neck hematoma, possible airway compromise, labile blood pressure post-operatively, cerebral hyperperfusion syndrome, and possible need for additional interventions in the future. The patient is aware of the risks and agrees to proceed forward with the procedure.  DESCRIPTION: After full informed written consent was obtained from the patient, the patient was brought back to the operating room and placed supine upon the operating table.  Prior to  induction, the patient received IV antibiotics.  After obtaining adequate anesthesia, the patient was placed into semi-Fowler position with a shoulder roll in place and the patient's neck slightly hyperextended and rotated away from the surgical site.  The patient was prepped in the standard fashion for a combined CABG and right carotid endarterectomy.  I made an incision anterior to the sternocleidomastoid muscle and dissected down through the subcutaneous tissue.  The platysmas was opened with electrocautery.  Then I dissected down to the internal jugular vein.  This was dissected posteriorly until I obtained visualization of the common carotid artery.  This was dissected out and then an umbilical tape was placed around the common carotid artery and I loosely applied a Rumel tourniquet.  I then dissected in a periadventitial fashion along the common carotid artery up to the bifurcation.  I then identified the external carotid artery and the superior thyroid artery.  A 2-0 silk tie was looped around the superior thyroid artery, and I also dissected out the external carotid artery and placed a vessel loop around it.  In continuing the dissection to the internal carotid artery, I identified the facial vein.  This was ligated and then transected, giving me improved exposure of the internal carotid artery.  In the process of this dissection, the hypoglossal nerve was identified.  I then dissected out the internal carotid artery until I identified an area of soft tissue in the internal carotid artery.  I had concerns that the disease extended much higher than suggested by the MRA, so I extended the incision and dissected out the subcutaneous tissue to improve the exposure.  I extended the pericarotid dissection another 3-4 cm more distal.  I was posterior to the hypoglossal nerve at  this point.  I placed an umbilical tape around the artery and loosely applied a Rumel tourniquet.    The patient was given 12000 units of  Heparin intravenously, which was a therapeutic bolus. An additional 2000 units of Heparin was administered every hour after initial bolus to maintain anticoagulation.  In total, 40981 units of Heparin was administrated to achieve and maintain a therapeutic level of anticoagulation.  After waiting 3 minutes, then I clamped the internal carotid artery, external carotid artery and then the common carotid artery.  I then made an arteriotomy in the common carotid artery with a 11 blade, and extended the arteriotomy with a Potts scissor down into the common carotid artery, then I carried the arteriotomy through the bifurcation into the internal carotid artery.  This internal carotid artery was nearly occluded with a soft plaque.  The disease extended much more distally into the internal carotid artery than anticipated, eventually I found a segment of the internal carotid artery that was relatively disease free.  I released the clamp on the internal carotid artery.  The backbleeding was not vigorous, supporting the need for a shunt.  At this point, I took the 10 shunt that previously been prepared and I inserted it into the internal carotid artery.  There was no bleeding around the shunt, so I did not have to tighten the Rumel tourniquet.  I unclamped the shunt to verify retrograde blood flow in the internal carotid artery.  I then placed the other end of the shunt into the common carotid artery after unclamping the artery.  The Rumel tourniquet was tightened down around the shunt.  At this point, I verified blood flow in the shunt with a continuous doppler.    At this point, I started the endarterectomy in the distal internal carotid artery with a Penfield dissector as the tissue plane was only evident here.  I carried this dissected down into the common carotid artery, which was relatively limited in disease.  Then I transected the plaque at a segment in the common carotid artery where it was adherent.  I then carried  this dissection up into the external carotid artery.  The plaque was extracted by unclamping the external carotid artery and everting the artery.  The dissection was then carried into the internal carotid artery, extracting the remaining portion of the carotid plaque.  I passed the plaque off the field as a specimen.  I then spent the next 30 minutes removing intimal flaps and loose debris.  Eventually I reached the point where the residual plaque was densely adherent and any further dissection would compromise the integrity of the wall.  After verifying that there was no more loose intimal flaps or debris, I re-interrogated the entirety of this carotid artery.  At this point, I was satisfied that the minimal remaining disease was densely adherent to the wall and wall integrity was intact.  At this point, I then fashioned a bovine pericardial patch for the geometry of this artery and sewed it in place with two running stitches of 6-0 Prolene, one from each end.  Prior to completing this patch angioplasty, I removed the shunt first from the internal carotid artery, from which there was limited backbleeding, and clamped it.  Then I removed the shunt from the common carotid artery, from which there was excellent antegrade bleeding, and then clamped it.  At this point, I allowed the external carotid artery to backbleed, which was continuous but not pulsatile.  Then I instilled  heparinized saline in this patched artery and then completed the patch angioplasty in the usual fashion.  First, I released the clamp on the external carotid artery, then I released it on the common carotid artery.  After waiting a few seconds, I then released the clamp on the internal carotid artery.  I then interrogated this patient's arteries with the continuous Doppler.  The audible waveforms in each artery were consistent with the expected characteristics for each artery.  The Sonosite probe was then sterilely draped and used to interrogate  the carotid artery in both longitudinal and transverse views.  All lumens were patent and there was no evidence of technical defects or residual intimal flaps.    I gave the patient 50 mg of protamine to reverse his anticoagulation to facilitate the sternotomy.  I packed the neck incision with some dry dressing.  After waiting a few minutes, there was no active bleeding but some diffuse oozing, so I felt placement of a drain was indicated.  I obtained a TLS drain and made a stab incision in the inferior neck.  I then dissected from the stab incision to the carotid exposure and then delivered the TLS drain through the subcutaneous tissue.  I secured the drain in place with a 3-0 Nylon.  I shortened the drain for the geometry of the exposure and laid the drain adjacent to the carotid artery.  I loaded the suction receptacle onto the external drain end.  The test tube will be applied once the neck is closed at the end of this procedure.  At this point, the carotid portion of this case was ended.        COMPLICATIONS: no obvious complications at this point in the case  CONDITION: stable   Leonides Sake, MD [facility] Office: [phone] Pager: [phone]  10/27/2015, 11:01 AM

## 2015-10-27 NOTE — Progress Notes (Signed)
[facility] BRIEF SICU PROGRESS NOTE  Day of Surgery  S/P Procedure(s) (LRB): CORONARY ARTERY BYPASS GRAFTING (CABG) x four, using left internal mammary artery and rt leg greater saphenous vein harvested endoscopically (N/A) TRANSESOPHAGEAL ECHOCARDIOGRAM (TEE) (N/A) RIGHT ENDARTERECTOMY CAROTID with patch angioplasty using Xenosure bovine pericardium patch (Right)   Sedated on vent NSR w/ stable hemodynamics, cardiac index >3 and PA pressures relatively low IABP 1:1 with Levo @ 2 Epi @ 3 Dop @ 3 and milrinone @ 0.25 O2 sats 100% on FiO2 100% ABG 7.33/50/63/26 > vent changes made Chest tube output < 100 mL 1st hour UOP adequate Labs okay w/ Hgb 11.6  Plan: Continue current plan  Purcell Nails, MD 10/27/2015 6:52 PM

## 2015-10-27 NOTE — Anesthesia Procedure Notes (Addendum)
Central Venous Catheter Insertion Performed by: anesthesiologist Patient location: Pre-op. Preanesthetic checklist: patient identified, IV checked, site marked, risks and benefits discussed, surgical consent, monitors and equipment checked, pre-op evaluation, timeout performed and anesthesia consent Position: Trendelenburg Lidocaine 1% used for infiltration Landmarks identified and Seldinger technique used Catheter size: 9 Fr Central line was placed.Sheath introducer Swan type and PA catheter depth:thermodilation and 50PA Cath depth:50 Procedure performed using ultrasound guided technique. Attempts: 1 Following insertion, line sutured and dressing applied. Post procedure assessment: blood return through all ports, free fluid flow and no air. Patient tolerated the procedure well with no immediate complications.    Central Venous Catheter Insertion Performed by: anesthesiologist Patient location: Pre-op. Preanesthetic checklist: patient identified, IV checked, site marked, risks and benefits discussed, surgical consent, monitors and equipment checked, pre-op evaluation, timeout performed and anesthesia consent Landmarks identified PA cath was placed.Swan type and PA catheter depth:thermodilation and 60PA Cath depth:60 Procedure performed using ultrasound guided technique. Attempts: 1 Patient tolerated the procedure well with no immediate complications.    Procedure Name: Intubation Date/Time: 10/27/2015 7:55 AM Performed by: Bobbie Stack Pre-anesthesia Checklist: Patient identified, Timeout performed, Emergency Drugs available, Suction available and Patient being monitored Patient Re-evaluated:Patient Re-evaluated prior to inductionOxygen Delivery Method: Circle system utilized Preoxygenation: Pre-oxygenation with 100% oxygen Intubation Type: IV induction Ventilation: Two handed mask ventilation required and Oral airway inserted - appropriate to patient  size Laryngoscope Size: Mac and 3 Grade View: Grade II Tube type: Oral Tube size: 8.0 mm Number of attempts: 1 Airway Equipment and Method: Stylet Placement Confirmation: ETT inserted through vocal cords under direct vision,  breath sounds checked- equal and bilateral and positive ETCO2 Secured at: 24 cm Tube secured with: Tape Dental Injury: Teeth and Oropharynx as per pre-operative assessment

## 2015-10-27 NOTE — Transfer of Care (Signed)
Immediate Anesthesia Transfer of Care Note  Patient: Christopher [name]  Procedure(s) Performed: Procedure(s): CORONARY ARTERY BYPASS GRAFTING (CABG) x four, using left internal mammary artery and rt leg greater saphenous vein harvested endoscopically (N/A) TRANSESOPHAGEAL ECHOCARDIOGRAM (TEE) (N/A) RIGHT ENDARTERECTOMY CAROTID with patch angioplasty using Xenosure bovine pericardium patch (Right)  Patient Location: SICU  Anesthesia Type:General  Level of Consciousness: sedated and Patient remains intubated per anesthesia plan  Airway & Oxygen Therapy: Patient remains intubated per anesthesia plan and Patient placed on Ventilator (see vital sign flow sheet for setting)  Post-op Assessment: Report given to RN and Post -op Vital signs reviewed and stable  Post vital signs: Reviewed and stable  Last Vitals:  Filed Vitals:   10/26/15 2039 10/27/15 0452  BP: 120/70 114/65  Pulse: 74 72  Temp: 36.9 C 36.6 C  Resp: 18 18    Complications: No apparent anesthesia complications

## 2015-10-28 ENCOUNTER — Inpatient Hospital Stay (HOSPITAL_COMMUNITY): Payer: BLUE CROSS/BLUE SHIELD

## 2015-10-28 ENCOUNTER — Encounter (HOSPITAL_COMMUNITY): Payer: Self-pay | Admitting: Cardiothoracic Surgery

## 2015-10-28 LAB — GLUCOSE, CAPILLARY
GLUCOSE-CAPILLARY: 163 mg/dL — AB (ref 65–99)
Glucose-Capillary: 164 mg/dL — ABNORMAL HIGH (ref 65–99)
Glucose-Capillary: 161 mg/dL — ABNORMAL HIGH (ref 65–99)
Glucose-Capillary: 170 mg/dL — ABNORMAL HIGH (ref 65–99)

## 2015-10-28 LAB — POCT I-STAT 3, ART BLOOD GAS (G3+)
ACID-BASE DEFICIT: 1 mmol/L (ref 0.0–2.0)
Acid-base deficit: 1 mmol/L (ref 0.0–2.0)
Acid-base deficit: 1 mmol/L (ref 0.0–2.0)
Acid-base deficit: 2 mmol/L (ref 0.0–2.0)
BICARBONATE: 23.3 meq/L (ref 20.0–24.0)
BICARBONATE: 23.4 meq/L (ref 20.0–24.0)
BICARBONATE: 22.3 meq/L (ref 20.0–24.0)
Bicarbonate: 23.4 mEq/L (ref 20.0–24.0)
Bicarbonate: 23.9 mEq/L (ref 20.0–24.0)
O2 SAT: 94 %
O2 SAT: 96 %
O2 SAT: 91 %
O2 Saturation: 93 %
O2 Saturation: 93 %
PCO2 ART: 37.7 mmHg (ref 35.0–45.0)
PCO2 ART: 37 mmHg (ref 35.0–45.0)
PCO2 ART: 36.4 mmHg (ref 35.0–45.0)
PCO2 ART: 36.7 mmHg (ref 35.0–45.0)
PH ART: 7.401 (ref 7.350–7.450)
PH ART: 7.41 (ref 7.350–7.450)
PH ART: 7.426 (ref 7.350–7.450)
PH ART: 7.392 (ref 7.350–7.450)
PO2 ART: 60 mmHg — AB (ref 80.0–100.0)
PO2 ART: 67 mmHg — AB (ref 80.0–100.0)
Patient temperature: 37.3
TCO2: 24 mmol/L (ref 0–100)
TCO2: 24 mmol/L (ref 0–100)
TCO2: 25 mmol/L (ref 0–100)
TCO2: 25 mmol/L (ref 0–100)
TCO2: 23 mmol/L (ref 0–100)
pCO2 arterial: 36.2 mmHg (ref 35.0–45.0)
pH, Arterial: 7.421 (ref 7.350–7.450)
pO2, Arterial: 69 mmHg — ABNORMAL LOW (ref 80.0–100.0)
pO2, Arterial: 83 mmHg (ref 80.0–100.0)
pO2, Arterial: 66 mmHg — ABNORMAL LOW (ref 80.0–100.0)

## 2015-10-28 LAB — CREATININE, SERUM
CREATININE: 1.25 mg/dL — AB (ref 0.61–1.24)
GFR calc Af Amer: >60 mL/min (ref >60)

## 2015-10-28 LAB — CBC
HCT: 30.2 % — ABNORMAL LOW (ref 39.0–52.0)
HCT: 29.1 % — ABNORMAL LOW (ref 39.0–52.0)
HEMOGLOBIN: 10.1 g/dL — AB (ref 13.0–17.0)
Hemoglobin: 9.6 g/dL — ABNORMAL LOW (ref 13.0–17.0)
MCH: 30.1 pg (ref 26.0–34.0)
MCH: 29.8 pg (ref 26.0–34.0)
MCHC: 33.4 g/dL (ref 30.0–36.0)
MCHC: 33 g/dL (ref 30.0–36.0)
MCV: 89.9 fL (ref 78.0–100.0)
MCV: 90.4 fL (ref 78.0–100.0)
PLATELETS: 152 10*3/uL (ref 150–400)
Platelets: 161 10*3/uL (ref 150–400)
RBC: 3.36 MIL/uL — AB (ref 4.22–5.81)
RBC: 3.22 MIL/uL — ABNORMAL LOW (ref 4.22–5.81)
RDW: 15.1 % (ref 11.5–15.5)
RDW: 15.8 % — AB (ref 11.5–15.5)
WBC: 12.9 10*3/uL — AB (ref 4.0–10.5)
WBC: 15.1 10*3/uL — ABNORMAL HIGH (ref 4.0–10.5)

## 2015-10-28 LAB — MAGNESIUM
MAGNESIUM: 2.5 mg/dL — AB (ref 1.7–2.4)
MAGNESIUM: 2.2 mg/dL (ref 1.7–2.4)

## 2015-10-28 LAB — BASIC METABOLIC PANEL
ANION GAP: 6 (ref 5–15)
BUN: 14 mg/dL (ref 6–20)
CHLORIDE: 104 mmol/L (ref 101–111)
CO2: 23 mmol/L (ref 22–32)
CREATININE: 1.21 mg/dL (ref 0.61–1.24)
Calcium: 8.4 mg/dL — ABNORMAL LOW (ref 8.9–10.3)
GFR calc non Af Amer: >60 mL/min (ref >60)
Glucose, Bld: 132 mg/dL — ABNORMAL HIGH (ref 65–99)
Potassium: 4.5 mmol/L (ref 3.5–5.1)
SODIUM: 133 mmol/L — AB (ref 135–145)

## 2015-10-28 LAB — CARBOXYHEMOGLOBIN
Carboxyhemoglobin: 1.5 % (ref 0.5–1.5)
Methemoglobin: 1 % (ref 0.0–1.5)
O2 Saturation: 68 %
Total hemoglobin: 9.6 g/dL — ABNORMAL LOW (ref 13.5–18.0)

## 2015-10-28 LAB — PREPARE FRESH FROZEN PLASMA
UNIT DIVISION: 0
Unit division: 0

## 2015-10-28 LAB — POCT I-STAT, CHEM 8
BUN: 18 mg/dL (ref 6–20)
CHLORIDE: 100 mmol/L — AB (ref 101–111)
CREATININE: 1.1 mg/dL (ref 0.61–1.24)
Calcium, Ion: 1.13 mmol/L (ref 1.12–1.23)
GLUCOSE: 143 mg/dL — AB (ref 65–99)
HEMATOCRIT: 32 % — AB (ref 39.0–52.0)
HEMOGLOBIN: 10.9 g/dL — AB (ref 13.0–17.0)
POTASSIUM: 4.8 mmol/L (ref 3.5–5.1)
Sodium: 134 mmol/L — ABNORMAL LOW (ref 135–145)
TCO2: 21 mmol/L (ref 0–100)

## 2015-10-28 MED ORDER — FUROSEMIDE 10 MG/ML IJ SOLN
20.0000 mg | Freq: Two times a day (BID) | INTRAMUSCULAR | Status: DC
  Administered 2015-10-28: 20 mg via INTRAVENOUS
  Filled 2015-10-28 (×2): qty 2

## 2015-10-28 MED ORDER — INSULIN DETEMIR 100 UNIT/ML ~~LOC~~ SOLN
12.0000 [IU] | Freq: Two times a day (BID) | SUBCUTANEOUS | Status: DC
  Administered 2015-10-28 – 2015-11-24 (×50): 12 [IU] via SUBCUTANEOUS
  Filled 2015-10-28 (×56): qty 0.12

## 2015-10-28 MED ORDER — INSULIN ASPART 100 UNIT/ML ~~LOC~~ SOLN
0.0000 [IU] | SUBCUTANEOUS | Status: DC
  Administered 2015-10-28 – 2015-11-09 (×42): 2 [IU] via SUBCUTANEOUS
  Administered 2015-11-09: 4 [IU] via SUBCUTANEOUS
  Administered 2015-11-09 – 2015-11-10 (×6): 2 [IU] via SUBCUTANEOUS
  Administered 2015-11-10: 4 [IU] via SUBCUTANEOUS
  Administered 2015-11-10 – 2015-11-20 (×14): 2 [IU] via SUBCUTANEOUS

## 2015-10-28 MED ORDER — FUROSEMIDE 10 MG/ML IJ SOLN
40.0000 mg | Freq: Every day | INTRAMUSCULAR | Status: DC
  Administered 2015-10-28: 40 mg via INTRAVENOUS
  Filled 2015-10-28: qty 4

## 2015-10-28 MED ORDER — CETYLPYRIDINIUM CHLORIDE 0.05 % MT LIQD
7.0000 mL | Freq: Two times a day (BID) | OROMUCOSAL | Status: DC
  Administered 2015-10-28: 7 mL via OROMUCOSAL

## 2015-10-28 MED FILL — Potassium Chloride Inj 2 mEq/ML: INTRAVENOUS | Qty: 40 | Status: AC

## 2015-10-28 MED FILL — Magnesium Sulfate Inj 50%: INTRAMUSCULAR | Qty: 10 | Status: AC

## 2015-10-28 MED FILL — Heparin Sodium (Porcine) Inj 1000 Unit/ML: INTRAMUSCULAR | Qty: 30 | Status: AC

## 2015-10-28 NOTE — Procedures (Signed)
Extubation Procedure Note  Patient Details:   Name: Christopher [name] DOB: [birth year] MRN: [id number]   Airway Documentation:     Evaluation  O2 sats: stable throughout Complications: No apparent complications Patient did tolerate procedure well. Bilateral Breath Sounds: Diminished, Clear Suctioning: Airway Yes   Patient extubated to 4 LNC via SICU wean. NIF -40, VC 0.9-1L. Positive cuff leak, Able to vocalize and clear secretions. RT will continue to monitor.  Lurlean Leyden 10/28/2015, 9:09 AM

## 2015-10-28 NOTE — Care Management Note (Signed)
Case Management Note  Patient Details  Name: Christopher [name] MRN: [id number] Date of Birth: 10-10-1960  Subjective/Objective:  Pt is s/p CABG                  Action/Plan:  Pt is from home with wife; independent.  CM will continue to monitor for disposition needs   Expected Discharge Date:                  Expected Discharge Plan:  Home/Self Care  In-House Referral:     Discharge planning Services  CM Consult  Post Acute Care Choice:    Choice offered to:     DME Arranged:    DME Agency:     HH Arranged:    HH Agency:     Status of Service:  In process, will continue to follow  Medicare Important Message Given:    Date Medicare IM Given:    Medicare IM give by:    Date Additional Medicare IM Given:    Additional Medicare Important Message give by:     If discussed at Long Length of Stay Meetings, dates discussed:    Additional Comments:  Cherylann Parr, RN 10/28/2015, 11:00 AM

## 2015-10-28 NOTE — Anesthesia Postprocedure Evaluation (Signed)
Anesthesia Post Note  Patient: Christopher [name]  Procedure(s) Performed: Procedure(s) (LRB): CORONARY ARTERY BYPASS GRAFTING (CABG) x four, using left internal mammary artery and rt leg greater saphenous vein harvested endoscopically (N/A) TRANSESOPHAGEAL ECHOCARDIOGRAM (TEE) (N/A) RIGHT ENDARTERECTOMY CAROTID with patch angioplasty using Xenosure bovine pericardium patch (Right)  Patient location during evaluation: SICU Anesthesia Type: General Level of consciousness: awake and alert, oriented and patient cooperative Pain management: pain level controlled Vital Signs Assessment: post-procedure vital signs reviewed and stable Respiratory status: spontaneous breathing, nonlabored ventilation, respiratory function stable and patient connected to nasal cannula oxygen Cardiovascular status: blood pressure returned to baseline and stable Postop Assessment: no signs of nausea or vomiting Anesthetic complications: no    Last Vitals:  Filed Vitals:   10/28/15 1600 10/28/15 1700  BP: 103/67 100/68  Pulse: 43 45  Temp: 37.2 C 37.3 C  Resp: 19 21    Last Pain:  Filed Vitals:   10/28/15 1712  PainSc: Asleep                 Ameriah Lint,E. Sevastian Witczak

## 2015-10-28 NOTE — Op Note (Signed)
NAME:  CHEVEZ, SAMBRANO NO.:  [id number]  MEDICAL RECORD NO.:  [id number]  LOCATION:                                 FACILITY:  PHYSICIAN:  Kerin Perna, M.D.  DATE OF BIRTH:  [birth year]  DATE OF PROCEDURE:  10/27/2015 DATE OF DISCHARGE:                              OPERATIVE REPORT   OPERATION: 1. Coronary artery bypass grafting x4 (left internal mammary artery     left anterior descending coronary artery, saphenous vein graft to     diagonal, saphenous vein graft to ramus intermediate, saphenous     vein graft to right coronary artery). 2. Endoscopic harvest of right leg greater saphenous vein. 3. Placement of intra-aortic balloon pump. 4. Placement of right femoral A-line.  SURGEON:  Kerin Perna, MD  ASSISTANT:  Lowella Dandy, PA-C  PREOPERATIVE DIAGNOSES: 1. Acute inferior myocardial infarction, right ventricular infarct,     class 4 congestive heart failure with low cardiac output. 2. Chronic ischemic cardiomyopathy with moderate left ventricular     dysfunction.  ANESTHESIA:  General by Germaine Pomfret, MD  CLINICAL NOTE:  The patient is a 55 year old, obese, reformed smoker who presented with symptoms of class 4 CHF and positive cardiac enzymes.He had a completed MI from proximal occlusion of a hyper dominant RCA. Cardiac evaluation included a 2D echo which showed moderate-to-severe LV and RV dysfunction with inferior wall hypokinesia.  Cardiac catheterization by Dr. Excell Seltzer demonstrated severe diffuse coronary disease with total occlusion of the RCA, high-grade stenosis, 95% of the LAD diagonal, and 90% stenosis of ramus intermediate.  The circumflex was small and non- dominant.  LVEDP was 39-40 mmHg, EF was 25-30%.  The patient was treated for his heart failure and placed on heparin.  Surgical coronary revascularization was recommended.  Prior to surgery, I examined the patient in his hospital room and reviewed the results of cardiac  catheterization and echocardiogram with the patient and his wife.  I discussed the indications and expected benefits of high-risk coronary artery bypass grafting for treatment of his severe coronary artery disease.  The patient was scheduled for an MRI cardiac viability study; however he had severe diaphoresis and shortness of breath when he laid supine and the study was canceled.  The patient had poor targets for grafting, poor LV function, and preoperative heart failure, but I felt that his age, surgical coronary revascularization, even at high risk, was his best long term therapeutic strategy.  I discussed the details of CABG for his severe CAD with the patient and his family.  I discussed the use of general anesthesia and cardiopulmonary bypass, the location of the surgical incisions, and the expected postoperative hospital recovery.  I discussed the potential risks of CABG with the patient including risks of stroke, MI, bleeding, blood transfusion requirement, infection, postoperative pulmonary problems including pleural effusions, and death.  After reviewing these issues, he demonstrated his understanding, he agreed to proceed with surgery under what I felt was an informed consent.  OPERATIVE FINDINGS: 1. Severe diffuse coronary disease with poor targets. 2. Moderate-to-severe LV function.Moderate to severe RV dysfunction 3. Poor cardiac performance after initial separation from     cardiopulmonary bypass which were  improved after placement of an     intra-aortic balloon pump and stabilization of hemodynamics.  OPERATIVE PROCEDURE:  The patient was brought to the operating placed supine on the operating table where general anesthesia was induced under invasive hemodynamic monitoring.  The patient had a right carotid end arterectomy initially by Dr. Imogene Burn, which will be dictated in a separate note.  General anesthesia was induced under invasive hemodynamic monitoring in the neck,  right neck, chest, abdomen, and legs were prepped and draped as a sterile field.  After the right carotid endarterectomy had been completed, a 2nd proper time-out was performed.  Saphenous vein had been harvested from the right leg during the carotid endarterectomy.  A sternal incision was made.  The sternum was divided and retracted.  The left sternal edge was elevated and the left internal mammary artery was harvested as a pedicle graft from its origin at the subclavian vessels.  It was a good vessel with good flow.  The sternal retractor was placed using the deep blades due to the patient's obese body habitus.  The pericardium was opened. There was a moderate pericardial effusion.  The pericardium was suspended and pursestrings were placed in ascending aorta and right atrium.  The patient was heparinized and the ACT was documented as being therapeutic.  The patient was cannulated in the aorta and right atrium and placed on cardiopulmonary bypass.  The coronaries were identified for grafting.  The distal RCA was chronically diseased and occluded and was not a target.  The RCA graft was placed to the RV branch in order to help improve his RV dysfunction.  His initial cardiac index was 2.9 and his initial CVP was over 25.  Cardioplegia cannulas were placed for both antegrade and retrograde cold blood cardioplegia and the mammary artery and vein grafts were prepared for the distal anastomoses.  The patient was cooled to 32 degrees and aortic crossclamp was applied.  A liter of cold blood cardioplegia was delivered in split doses between the antegrade aortic and retrograde coronary sinus catheters.  There was good cardioplegic arrest and septal temperature dropped less than 15 degrees.  Cardioplegia was delivered every 20 minutes.  The distal coronary anastomoses were performed.  The first distal anastomosis was to the RV marginal branch of the right coronary.  There was a heavily  diseased vessel 1.5 mm with a proximal occlusion.  A reverse saphenous vein was sewn end-to-side with running 7-0 Prolene with good flow through the graft.  Cardioplegia was redosed.  The second distal anastomosis was to the ramus intermediate branch of the left coronary.  There was a small heavily diseased vessel approximately 1.2 mm.  A reverse saphenous vein was sewn end-to-side with running 7-0 Prolene.  There was adequate flow through the graft. Cardioplegia was redosed.  The third distal anastomosis was placed to the diagonal branch of the LAD.  This had an ostial 90% stenosis.  This was a better vessel and measured 1.5 mm.  A reverse saphenous vein was sewn end-to-side with running 7-0 Prolene.  There was good flow through the graft. Cardioplegia was redosed.  The 4th distal anastomosis was placed to the distal LAD.  More proximally, it was intramyocardial.  Left IMA pedicle was brought through an opening and the left lateral pericardium was brought onto the LAD and sewn end-to-side with running 8-0 Prolene.  There was good flow through the anastomosis after briefly releasing the bulldog on the mammary pedicle and the bulldog was reapplied.  The pedicle was secured to epicardium.  Cardioplegia was redosed.  With the cross-clamp still in place, 3 proximal vein anastomoses were performed on the ascending aorta using a 4.5 mm punch running 6-0 Prolene.  Prior to tying down the final proximal anastomosis, air was vented from the coronaries with a dose of retrograde warm blood cardioplegia.  Crossclamp was removed.  The heart resumed a spontaneous rhythm.  The vein grafts were de-aired and opened, each had good flow.  Temporary pacing wires were applied. The patient was rewarmed and reperfused.  The lungs were expanded and ventilator was resumed.  On low-dose epinephrine, dopamine, and milrinone, an attempt was made to wean the patient from cardiopulmonary bypass.  Although,  blood pressure initially was adequate, cardiac output was marginal and there was evidence of poor RV function from his preoperative RV MI.  The patient was placed back on cardiopulmonary bypass.  We separated from cardiopulmonary bypass after placing a balloon pump through the femoral arterial access catheter and placed the patient on inhaled nitric oxide.  Using these measures, the patient weaned from cardiopulmonary bypass easily and biventricular function was better by significant amount.  Protamine was administered cautiously without adverse reaction.  The patient remained stable.  Cardiac output was greater than 6-7 L/minute.  After reversal of heparin with protamine, patient still had coagulation defect and he was given FFP with improved coagulation function.  The superior pericardial fat was closed over the aorta.  Anterior mediastinal and bilateral pleural chest tubes were placed which had been used to drain bilateral pleural effusions.  The sternum was closed with interrupted steel wire.  The pectoralis fascia was closed with a running #1 Vicryl.  Subcutaneous and skin layers were closed with running Vicryl.  The right neck incision which had been left open by the vascular surgeon was then closed in layers using Vicryl over the previously placed drain. Hemostasis was documented prior to closing the neck incision.  The patient then returned to the ICU in critical, but stable condition. Total cardiopulmonary bypass time was 163 minutes.     Kerin Perna, M.D.   ______________________________ Kerin Perna, M.D.    PV/MEDQ  D:  10/27/2015  T:  10/28/2015  Job:  [id number]

## 2015-10-28 NOTE — Plan of Care (Signed)
Problem: Health Behavior/Discharge Planning: Goal: Identification of resources available to assist in meeting health care needs will improve Outcome: Progressing Case management consulted and seeing patient.

## 2015-10-28 NOTE — Progress Notes (Addendum)
[facility] [place]  Subjective  - Intubated moves eyes to voice.   Objective 101/56 88 99.3 F (37.4 C) (Core (Comment)) 14 94%  Intake/Output Summary (Last 24 hours) at 10/28/15 0744 Last data filed at 10/28/15 0700  Gross per 24 hour  Intake 7631.8 ml  Output   5045 ml  Net 2586.8 ml    Right neck dressing C/D/I Drain in place   Assessment/Planning: POD #1 right CEA  Intubated and restrained this am   Clinton Gallant [facility] 10/28/2015 7:44 AM --  Laboratory Lab Results:  Recent Labs  10/27/15 1750 10/27/15 1751 10/28/15 0220  WBC 17.2*  --  12.9*  HGB 11.6* 11.9* 10.1*  HCT 35.0* 35.0* 30.2*  PLT 153  --  161   BMET  Recent Labs  10/27/15 0446  10/27/15 1604 10/27/15 1751 10/28/15 0220  NA 135  < > 134* 135 133*  K 4.3  < > 5.1 4.4 4.5  CL 102  < > 99*  --  104  CO2 22  --   --   --  23  GLUCOSE 100*  < > 199* 171* 132*  BUN 16  < > 18  --  14  CREATININE 1.15  < > 1.10  --  1.21  CALCIUM 8.8*  --   --   --  8.4*  < > = values in this interval not displayed.  COAG Lab Results  Component Value Date   INR 1.59* 10/27/2015   INR 1.24 10/20/2015   No results found for: PTT   Addendum  I have independently interviewed and examined the patient.  Pt now extubated.  Answering questions appropriately.  Hand grip 5/5 sym, tongue midline.  Did not check feet due to IABP presence.  Reportedly TLS only 10 cc/shift.  Right neck bandaged (connected to sternotomy).  - TLS drain can come out tomorrow.   - Check right neck once sternotomy bandages come down also   Leonides Sake, MD [facility] [place] Office: [phone] Pager: [phone]  10/28/2015, 3:47 PM

## 2015-10-28 NOTE — Progress Notes (Signed)
RT turned nitric off at this time. RT to monitor as needed.

## 2015-10-28 NOTE — Progress Notes (Signed)
Patient ID: Christopher [name], male   DOB: [birth year], 55 y.o.   MRN: [id number]  SICU Evening Rounds:  Hemodynamically stable. CI 2.2 on dop 3, epi 3, milrinone 0.25.  IABP.  Extubated today.  Urine output ok  BMET    Component Value Date/Time   NA 134* 10/28/2015 1620   K 4.8 10/28/2015 1620   CL 100* 10/28/2015 1620   CO2 23 10/28/2015 0220   GLUCOSE 143* 10/28/2015 1620   BUN 18 10/28/2015 1620   CREATININE 1.10 10/28/2015 1620   CALCIUM 8.4* 10/28/2015 0220   GFRNONAA >60 10/28/2015 1613   GFRAA >60 10/28/2015 1613    CBC    Component Value Date/Time   WBC 15.1* 10/28/2015 1613   RBC 3.22* 10/28/2015 1613   HGB 10.9* 10/28/2015 1620   HCT 32.0* 10/28/2015 1620   PLT 152 10/28/2015 1613   MCV 90.4 10/28/2015 1613   MCH 29.8 10/28/2015 1613   MCHC 33.0 10/28/2015 1613   RDW 15.8* 10/28/2015 1613    A/P: stable on current inotropes. Continue current treatment and plan to remove IABP tomorrow hopefully.

## 2015-10-28 NOTE — Plan of Care (Signed)
Problem: Nutritional: Goal: Risk for body nutrition deficit will decrease Outcome: Progressing Diet progression as patient tolerates. Currently on clear liquid diet.   Problem: Respiratory: Goal: Ability to tolerate decreased levels of ventilator support will improve Outcome: Completed/Met Date Met:  10/28/15 Extubated 10/28/2015 0900

## 2015-10-28 NOTE — Progress Notes (Signed)
1 Day Post-Op Procedure(s) (LRB): CORONARY ARTERY BYPASS GRAFTING (CABG) x four, using left internal mammary artery and rt leg greater saphenous vein harvested endoscopically (N/A) TRANSESOPHAGEAL ECHOCARDIOGRAM (TEE) (N/A) RIGHT ENDARTERECTOMY CAROTID with patch angioplasty using Xenosure bovine pericardium patch (Right) Subjective: Ischemic cardiomyopathy on IABP and low dose pressors Pre-op RV infarct from acute prox RCA occlusion Nitric oxide weaned off- vent wean in progress Having persistent episodes of heart block   On DDD pacing Neuro intact  Objective: Vital signs in last 24 hours: Temp:  [97.9 F (36.6 C)-99.5 F (37.5 C)] 99.5 F (37.5 C) (03/22 0800) Pulse Rate:  [73-101] 89 (03/22 0814) Cardiac Rhythm:  [-] Atrial paced (03/22 0700) Resp:  [14-21] 19 (03/22 0814) BP: (85-126)/(42-70) 111/53 mmHg (03/22 0814) SpO2:  [79 %-100 %] 95 % (03/22 0814) Arterial Line BP: (86-143)/(45-68) 104/50 mmHg (03/22 0800) FiO2 (%):  [40 %-100 %] 40 % (03/22 0822) Weight:  [250 lb 10.6 oz (113.7 kg)] 250 lb 10.6 oz (113.7 kg) (03/22 0515)  Hemodynamic parameters for last 24 hours: PAP: (24-35)/(15-24) 34/21 mmHg CVP:  [12 mmHg-21 mmHg] 15 mmHg CO:  [5.6 L/min-7.2 L/min] 5.9 L/min CI:  [2.5 L/min/m2-3.1 L/min/m2] 2.6 L/min/m2  Intake/Output from previous day: 03/21 0701 - 03/22 0700 In: 7631.8 [I.V.:4350.8; Blood:1526; NG/GT:30; IV Piggyback:1725] Out: 5045 [Urine:3605; Emesis/NG output:150; Blood:1000; Chest Tube:290] Intake/Output this shift: Total I/O In: 301.9 [I.V.:101.9; IV Piggyback:200] Out: -        Exam    General- alert and comfortable on vent   Lungs- clear without rales, wheezes   Cor- regular rate and rhythm, no murmur , gallop   Abdomen- soft, non-tender   Extremities - warm, non-tender, minimal edema, doppler    signal + both feet   Neuro- oriented, appropriate, no focal weakness   Lab Results:  Recent Labs  10/27/15 1750 10/27/15 1751 10/28/15 0220   WBC 17.2*  --  12.9*  HGB 11.6* 11.9* 10.1*  HCT 35.0* 35.0* 30.2*  PLT 153  --  161   BMET:  Recent Labs  10/27/15 0446  10/27/15 1604 10/27/15 1751 10/28/15 0220  NA 135  < > 134* 135 133*  K 4.3  < > 5.1 4.4 4.5  CL 102  < > 99*  --  104  CO2 22  --   --   --  23  GLUCOSE 100*  < > 199* 171* 132*  BUN 16  < > 18  --  14  CREATININE 1.15  < > 1.10  --  1.21  CALCIUM 8.8*  --   --   --  8.4*  < > = values in this interval not displayed.  PT/INR:  Recent Labs  10/27/15 1750  LABPROT 19.0*  INR 1.59*   ABG    Component Value Date/Time   PHART 7.401 10/28/2015 0411   HCO3 23.4 10/28/2015 0411   TCO2 24 10/28/2015 0411   ACIDBASEDEF 1.0 10/28/2015 0411   O2SAT 68.0 10/28/2015 0430   CBG (last 3)   Recent Labs  10/27/15 2111 10/27/15 2215 10/27/15 2313  GLUCAP 161* 170* 163*    Assessment/Plan: S/P Procedure(s) (LRB): CORONARY ARTERY BYPASS GRAFTING (CABG) x four, using left internal mammary artery and rt leg greater saphenous vein harvested endoscopically (N/A) TRANSESOPHAGEAL ECHOCARDIOGRAM (TEE) (N/A) RIGHT ENDARTERECTOMY CAROTID with patch angioplasty using Xenosure bovine pericardium patch (Right) Extubate Leave IABP at 1:2 today Diuresis Follow CVP, CI to monitor RV fx and cont low dose mil, epi lantus with SSI for DM  control Hold Beta blockers  LOS: 8 days    Christopher [name] 10/28/2015

## 2015-10-29 ENCOUNTER — Inpatient Hospital Stay (HOSPITAL_COMMUNITY): Payer: BLUE CROSS/BLUE SHIELD

## 2015-10-29 DIAGNOSIS — R570 Cardiogenic shock: Secondary | ICD-10-CM

## 2015-10-29 LAB — GLUCOSE, CAPILLARY
GLUCOSE-CAPILLARY: 106 mg/dL — AB (ref 65–99)
GLUCOSE-CAPILLARY: 118 mg/dL — AB (ref 65–99)
GLUCOSE-CAPILLARY: 132 mg/dL — AB (ref 65–99)
GLUCOSE-CAPILLARY: 133 mg/dL — AB (ref 65–99)
GLUCOSE-CAPILLARY: 141 mg/dL — AB (ref 65–99)
GLUCOSE-CAPILLARY: 142 mg/dL — AB (ref 65–99)
GLUCOSE-CAPILLARY: 96 mg/dL (ref 65–99)
GLUCOSE-CAPILLARY: 98 mg/dL (ref 65–99)
Glucose-Capillary: 105 mg/dL — ABNORMAL HIGH (ref 65–99)
Glucose-Capillary: 110 mg/dL — ABNORMAL HIGH (ref 65–99)
Glucose-Capillary: 113 mg/dL — ABNORMAL HIGH (ref 65–99)
Glucose-Capillary: 117 mg/dL — ABNORMAL HIGH (ref 65–99)
Glucose-Capillary: 117 mg/dL — ABNORMAL HIGH (ref 65–99)
Glucose-Capillary: 119 mg/dL — ABNORMAL HIGH (ref 65–99)
Glucose-Capillary: 120 mg/dL — ABNORMAL HIGH (ref 65–99)
Glucose-Capillary: 122 mg/dL — ABNORMAL HIGH (ref 65–99)
Glucose-Capillary: 123 mg/dL — ABNORMAL HIGH (ref 65–99)
Glucose-Capillary: 124 mg/dL — ABNORMAL HIGH (ref 65–99)
Glucose-Capillary: 129 mg/dL — ABNORMAL HIGH (ref 65–99)
Glucose-Capillary: 140 mg/dL — ABNORMAL HIGH (ref 65–99)
Glucose-Capillary: 144 mg/dL — ABNORMAL HIGH (ref 65–99)
Glucose-Capillary: 156 mg/dL — ABNORMAL HIGH (ref 65–99)

## 2015-10-29 LAB — POCT I-STAT 3, ART BLOOD GAS (G3+)
Acid-base deficit: 1 mmol/L (ref 0.0–2.0)
BICARBONATE: 23.8 meq/L (ref 20.0–24.0)
O2 Saturation: 92 %
PH ART: 7.409 (ref 7.350–7.450)
PO2 ART: 63 mmHg — AB (ref 80.0–100.0)
TCO2: 25 mmol/L (ref 0–100)
pCO2 arterial: 37.6 mmHg (ref 35.0–45.0)

## 2015-10-29 LAB — POCT I-STAT, CHEM 8
BUN: 22 mg/dL — ABNORMAL HIGH (ref 6–20)
Calcium, Ion: 1.11 mmol/L — ABNORMAL LOW (ref 1.12–1.23)
Chloride: 94 mmol/L — ABNORMAL LOW (ref 101–111)
Creatinine, Ser: 1.1 mg/dL (ref 0.61–1.24)
GLUCOSE: 123 mg/dL — AB (ref 65–99)
HEMATOCRIT: 29 % — AB (ref 39.0–52.0)
HEMOGLOBIN: 9.9 g/dL — AB (ref 13.0–17.0)
POTASSIUM: 4.3 mmol/L (ref 3.5–5.1)
SODIUM: 130 mmol/L — AB (ref 135–145)
TCO2: 22 mmol/L (ref 0–100)

## 2015-10-29 LAB — COMPREHENSIVE METABOLIC PANEL WITH GFR
ALT: 43 U/L (ref 17–63)
AST: 37 U/L (ref 15–41)
Albumin: 3.1 g/dL — ABNORMAL LOW (ref 3.5–5.0)
Alkaline Phosphatase: 46 U/L (ref 38–126)
Anion gap: 9 (ref 5–15)
BUN: 18 mg/dL (ref 6–20)
CO2: 23 mmol/L (ref 22–32)
Calcium: 8.4 mg/dL — ABNORMAL LOW (ref 8.9–10.3)
Chloride: 100 mmol/L — ABNORMAL LOW (ref 101–111)
Creatinine, Ser: 1.32 mg/dL — ABNORMAL HIGH (ref 0.61–1.24)
GFR calc Af Amer: >60 mL/min
GFR calc non Af Amer: 60 mL/min — ABNORMAL LOW
Glucose, Bld: 146 mg/dL — ABNORMAL HIGH (ref 65–99)
Potassium: 5.2 mmol/L — ABNORMAL HIGH (ref 3.5–5.1)
Sodium: 132 mmol/L — ABNORMAL LOW (ref 135–145)
Total Bilirubin: 2 mg/dL — ABNORMAL HIGH (ref 0.3–1.2)
Total Protein: 5.7 g/dL — ABNORMAL LOW (ref 6.5–8.1)

## 2015-10-29 LAB — CARBOXYHEMOGLOBIN
Carboxyhemoglobin: 1.6 % — ABNORMAL HIGH (ref 0.5–1.5)
Methemoglobin: 0.8 % (ref 0.0–1.5)
O2 Saturation: 57.4 %
Total hemoglobin: 9.7 g/dL — ABNORMAL LOW (ref 13.5–18.0)

## 2015-10-29 LAB — PROTIME-INR
INR: 1.82 — ABNORMAL HIGH (ref 0.00–1.49)
Prothrombin Time: 21 seconds — ABNORMAL HIGH (ref 11.6–15.2)

## 2015-10-29 LAB — TYPE AND SCREEN
ABO/RH(D): A POS
Antibody Screen: NEGATIVE

## 2015-10-29 LAB — CBC
HCT: 29.6 % — ABNORMAL LOW (ref 39.0–52.0)
Hemoglobin: 10.1 g/dL — ABNORMAL LOW (ref 13.0–17.0)
MCH: 31.4 pg (ref 26.0–34.0)
MCHC: 34.1 g/dL (ref 30.0–36.0)
MCV: 91.9 fL (ref 78.0–100.0)
Platelets: 142 10*3/uL — ABNORMAL LOW (ref 150–400)
RBC: 3.22 MIL/uL — ABNORMAL LOW (ref 4.22–5.81)
RDW: 15.9 % — ABNORMAL HIGH (ref 11.5–15.5)
WBC: 15.7 10*3/uL — ABNORMAL HIGH (ref 4.0–10.5)

## 2015-10-29 LAB — APTT: aPTT: 37 seconds (ref 24–37)

## 2015-10-29 MED ORDER — SODIUM CHLORIDE 0.9% FLUSH
10.0000 mL | Freq: Two times a day (BID) | INTRAVENOUS | Status: DC
Start: 2015-10-29 — End: 2015-11-24
  Administered 2015-10-29: 10 mL
  Administered 2015-10-30: 20 mL
  Administered 2015-10-30 – 2015-11-01 (×4): 10 mL
  Administered 2015-11-02: 40 mL
  Administered 2015-11-03: 30 mL
  Administered 2015-11-04: 10 mL
  Administered 2015-11-05: 20 mL
  Administered 2015-11-05 – 2015-11-06 (×3): 10 mL
  Administered 2015-11-07: 30 mL
  Administered 2015-11-07: 10 mL
  Administered 2015-11-08: 30 mL
  Administered 2015-11-08 – 2015-11-14 (×12): 10 mL
  Administered 2015-11-15: 20 mL
  Administered 2015-11-15: 10 mL
  Administered 2015-11-16: 20 mL
  Administered 2015-11-16 – 2015-11-20 (×7): 10 mL
  Administered 2015-11-21: 30 mL
  Administered 2015-11-21 – 2015-11-24 (×5): 10 mL

## 2015-10-29 MED ORDER — FUROSEMIDE 10 MG/ML IJ SOLN: 40.0000 mg | Freq: Every day | INTRAMUSCULAR | Status: DC

## 2015-10-29 MED ORDER — ENOXAPARIN SODIUM 40 MG/0.4ML ~~LOC~~ SOLN
40.0000 mg | SUBCUTANEOUS | Status: DC
  Administered 2015-10-30 – 2015-11-02 (×4): 40 mg via SUBCUTANEOUS
  Filled 2015-10-29 (×4): qty 0.4

## 2015-10-29 MED ORDER — METOLAZONE 5 MG PO TABS
5.0000 mg | ORAL_TABLET | Freq: Every day | ORAL | Status: DC
  Administered 2015-10-29 – 2015-10-30 (×2): 5 mg via ORAL
  Filled 2015-10-29 (×2): qty 1

## 2015-10-29 MED ORDER — ENOXAPARIN SODIUM 40 MG/0.4ML ~~LOC~~ SOLN: 40.0000 mg | SUBCUTANEOUS | Status: DC

## 2015-10-29 MED ORDER — METOCLOPRAMIDE HCL 5 MG/ML IJ SOLN
10.0000 mg | Freq: Four times a day (QID) | INTRAMUSCULAR | Status: DC
  Administered 2015-10-29 – 2015-11-01 (×15): 10 mg via INTRAVENOUS
  Filled 2015-10-29 (×15): qty 2

## 2015-10-29 MED ORDER — LEVALBUTEROL HCL 1.25 MG/0.5ML IN NEBU
1.2500 mg | INHALATION_SOLUTION | Freq: Three times a day (TID) | RESPIRATORY_TRACT | Status: DC
  Administered 2015-10-29 – 2015-11-03 (×15): 1.25 mg via RESPIRATORY_TRACT
  Filled 2015-10-29 (×15): qty 0.5

## 2015-10-29 MED ORDER — SODIUM CHLORIDE 0.9 % IV SOLN
INTRAVENOUS | Status: DC
  Administered 2015-10-29: 17:00:00 via INTRAVENOUS
  Administered 2015-10-30: 10 mL/h via INTRAVENOUS
  Administered 2015-11-02: 05:00:00 via INTRAVENOUS
  Administered 2015-11-04: 10 mL/h via INTRAVENOUS
  Administered 2015-11-05 – 2015-11-14 (×7): via INTRAVENOUS

## 2015-10-29 MED ORDER — FUROSEMIDE 10 MG/ML IJ SOLN
40.0000 mg | Freq: Two times a day (BID) | INTRAMUSCULAR | Status: DC
  Administered 2015-10-29 – 2015-10-30 (×3): 40 mg via INTRAVENOUS
  Filled 2015-10-29 (×2): qty 4

## 2015-10-29 MED ORDER — SODIUM CHLORIDE 0.9% FLUSH: 10.0000 mL | INTRAVENOUS | Status: DC | PRN

## 2015-10-29 MED FILL — Sodium Bicarbonate IV Soln 8.4%: INTRAVENOUS | Qty: 100 | Status: AC

## 2015-10-29 MED FILL — Heparin Sodium (Porcine) Inj 1000 Unit/ML: INTRAMUSCULAR | Qty: 30 | Status: AC

## 2015-10-29 MED FILL — Calcium Chloride Inj 10%: INTRAVENOUS | Qty: 10 | Status: AC

## 2015-10-29 MED FILL — Mannitol IV Soln 20%: INTRAVENOUS | Qty: 500 | Status: AC

## 2015-10-29 MED FILL — Electrolyte-R (PH 7.4) Solution: INTRAVENOUS | Qty: 3000 | Status: AC

## 2015-10-29 MED FILL — Sodium Chloride IV Soln 0.9%: INTRAVENOUS | Qty: 3000 | Status: AC

## 2015-10-29 MED FILL — Lidocaine HCl IV Inj 20 MG/ML: INTRAVENOUS | Qty: 10 | Status: AC

## 2015-10-29 NOTE — Progress Notes (Signed)
Patient ID: Christopher [name], male   DOB: November 15, 1960, 55 y.o.   MRN: [id number] EVENING ROUNDS NOTE :     [address]       [place] [address]             [phone]                 2 Days Post-Op Procedure(s) (LRB): CORONARY ARTERY BYPASS GRAFTING (CABG) x four, using left internal mammary artery and rt leg greater saphenous vein harvested endoscopically (N/A) TRANSESOPHAGEAL ECHOCARDIOGRAM (TEE) (N/A) RIGHT ENDARTERECTOMY CAROTID with patch angioplasty using Xenosure bovine pericardium patch (Right)  Total Length of Stay:  LOS: 9 days  BP 133/84 mmHg  Pulse 106  Temp(Src) 97.6 F (36.4 C) (Oral)  Resp 27  Ht 5\' 10"  (1.778 m)  Wt 254 lb 10.1 oz (115.5 kg)  BMI 36.54 kg/m2  SpO2 95%  .Intake/Output      03/22 0701 - 03/23 0700 03/23 0701 - 03/24 0700   P.O. 1160 240   I.V. (mL/kg) 1762.5 (15.3) 666 (5.8)   Blood  280   NG/GT 30    IV Piggyback 550    Total Intake(mL/kg) 3502.5 (30.3) 1186 (10.3)   Urine (mL/kg/hr) 1795 (0.6) 975 (0.8)   Emesis/NG output     Drains 10 (0)    Blood     Chest Tube 540 (0.2) 50 (0)   Total Output 2345 1025   Net +1157.5 +161          . sodium chloride    . sodium chloride    . sodium chloride 10 mL/hr at 10/29/15 1636  . DOPamine 0.996 mcg/kg/min (10/29/15 1500)  . EPINEPHrine 4 mg in dextrose 5% 250 mL infusion (16 mcg/mL) 3.013 mcg/min (10/29/15 1500)  . lactated ringers 20 mL/hr at 10/29/15 1500  . lactated ringers    . milrinone 0.3 mcg/kg/min (10/29/15 1650)  . norepinephrine (LEVOPHED) Adult infusion Stopped (10/28/15 1126)     Lab Results  Component Value Date   WBC 15.7* 10/29/2015   HGB 9.9* 10/29/2015   HCT 29.0* 10/29/2015   PLT 142* 10/29/2015   GLUCOSE 123* 10/29/2015   CHOL 150 10/21/2015   TRIG 171* 10/21/2015   HDL 18* 10/21/2015   LDLCALC 98 10/21/2015   ALT 43 10/29/2015   AST 37 10/29/2015   NA 130* 10/29/2015   K 4.3 10/29/2015   CL 94* 10/29/2015   CREATININE 1.10 10/29/2015     BUN 22* 10/29/2015   CO2 23 10/29/2015   TSH 3.817 10/20/2015   INR 1.82* 10/29/2015   HGBA1C 6.2* 10/23/2015   IAB out, bp stable pic placed today  Delight Ovens MD  Beeper [phone] Office [phone] 10/29/2015 6:05 PM

## 2015-10-29 NOTE — Progress Notes (Signed)
  [facility] Progress Note  Subjective  - POD #2   Doing ok. IABP just removed.   Objective Filed Vitals:   10/29/15 0900 10/29/15 0944  BP: 97/62   Pulse: 92 99  Temp: 98.2 F (36.8 C) 98.4 F (36.9 C)  Resp: 18 16    Intake/Output Summary (Last 24 hours) at 10/29/15 1022 Last data filed at 10/29/15 0944  Gross per 24 hour  Intake 3364.79 ml  Output   2010 ml  Net 1354.79 ml   TLS drain removed. Right neck incision healing well. No hematoma.  On ventimask, respirations nonlabored.  5/5 strength upper and lower extremities.   Assessment/Planning: 55 y.o. male is s/p: right carotid endarterectomy  2 Days Post-Op   Neuro exam intact.  IABP pump out this am.  Neck without hematoma. Stable from vascular standpoint.   Christopher [name] 10/29/2015 10:22 AM --  Laboratory CBC    Component Value Date/Time   WBC 15.7* 10/29/2015 0313   HGB 10.1* 10/29/2015 0313   HCT 29.6* 10/29/2015 0313   PLT 142* 10/29/2015 0313    BMET    Component Value Date/Time   NA 132* 10/29/2015 0313   K 5.2* 10/29/2015 0313   CL 100* 10/29/2015 0313   CO2 23 10/29/2015 0313   GLUCOSE 146* 10/29/2015 0313   BUN 18 10/29/2015 0313   CREATININE 1.32* 10/29/2015 0313   CALCIUM 8.4* 10/29/2015 0313   GFRNONAA 60* 10/29/2015 0313   GFRAA >60 10/29/2015 0313    COAG Lab Results  Component Value Date   INR 1.82* 10/29/2015   INR 1.59* 10/27/2015   INR 1.24 10/20/2015   No results found for: PTT  Antibiotics Anti-infectives    Start     Dose/Rate Route Frequency Ordered Stop   10/28/15 0300  cefUROXime (ZINACEF) 1.5 g in dextrose 5 % 50 mL IVPB     1.5 g 100 mL/hr over 30 Minutes Intravenous Every 12 hours 10/27/15 1731 10/30/15 0259   10/27/15 2300  vancomycin (VANCOCIN) IVPB 1000 mg/200 mL premix  Status:  Discontinued     1,000 mg 200 mL/hr over 60 Minutes Intravenous  Once 10/27/15 1731 10/27/15 1753   10/27/15 2000  vancomycin (VANCOCIN) IVPB 1000  mg/200 mL premix     1,000 mg 200 mL/hr over 60 Minutes Intravenous Every 12 hours 10/27/15 1753 10/28/15 2039   10/27/15 0400  vancomycin (VANCOCIN) 1,500 mg in sodium chloride 0.9 % 250 mL IVPB     1,500 mg 125 mL/hr over 120 Minutes Intravenous To Surgery 10/26/15 0822 10/27/15 1010   10/27/15 0400  cefUROXime (ZINACEF) 1.5 g in dextrose 5 % 50 mL IVPB     1.5 g 100 mL/hr over 30 Minutes Intravenous To Surgery 10/26/15 0822 10/27/15 1624   10/27/15 0400  cefUROXime (ZINACEF) 750 mg in dextrose 5 % 50 mL IVPB  Status:  Discontinued     750 mg 100 mL/hr over 30 Minutes Intravenous To Surgery 10/26/15 0823 10/27/15 0844       Maris Berger, PA-C [facility] Office: [phone] Pager: [phone] 10/29/2015 10:22 AM

## 2015-10-29 NOTE — Progress Notes (Signed)
   Daily Progress Note  Assessment/Planning: POD #2 s/p R CEA, CABG   Ok to D/C TLS  Will periodically check on patient  Subjective  - 2 Days Post-Op  No events overnight, pain tolerable  Objective Filed Vitals:   10/29/15 0815 10/29/15 0842 10/29/15 0900 10/29/15 0944  BP: 105/81  97/62   Pulse: 92 91 92 99  Temp: 98.4 F (36.9 C) 98.4 F (36.9 C) 98.2 F (36.8 C) 98.4 F (36.9 C)  TempSrc:      Resp: 20 14 18 16   Height:      Weight:      SpO2: 96% 99% 91% 93%    Intake/Output Summary (Last 24 hours) at 10/29/15 0946 Last data filed at 10/29/15 0944  Gross per 24 hour  Intake 3457.12 ml  Output   2220 ml  Net 1237.12 ml    VASC  Minimal serosang drainage via TLS drain, no R neck fullness, bandage still in place NEURO midline tongue, hand grip 5/5 sym  Laboratory CBC    Component Value Date/Time   WBC 15.7* 10/29/2015 0313   HGB 10.1* 10/29/2015 0313   HCT 29.6* 10/29/2015 0313   PLT 142* 10/29/2015 0313    BMET    Component Value Date/Time   NA 132* 10/29/2015 0313   K 5.2* 10/29/2015 0313   CL 100* 10/29/2015 0313   CO2 23 10/29/2015 0313   GLUCOSE 146* 10/29/2015 0313   BUN 18 10/29/2015 0313   CREATININE 1.32* 10/29/2015 0313   CALCIUM 8.4* 10/29/2015 0313   GFRNONAA 60* 10/29/2015 0313   GFRAA >60 10/29/2015 8841    Leonides Sake, MD [facility] [place] Office: [phone] Pager: [phone]  10/29/2015, 9:46 AM

## 2015-10-29 NOTE — Progress Notes (Signed)
eLink Physician-Brief Progress Note Patient Name: Christopher [name] DOB: 06/19/1961 MRN: [id number]   Date of Service  10/29/2015  HPI/Events of Note  Camera check-patient sleeping, placed on ventimask for hypoxia, spoke with Nurse Revonda Standard, patient doing well,no complaints  eICU Interventions  Patient may have underlying OSA Plan to transition off balloon pump in next 12 hrs        Christopher [name] 10/29/2015, 12:37 AM

## 2015-10-29 NOTE — Progress Notes (Signed)
CT surgery procedure note  Right femoral artery 40 cc intra-aortic balloon pump removed at the bedside by myself using standard technique after vacuum was applied to the balloon. Hand held pressure for 25 minutes was applied. No evidence of groin hematoma. Good Doppler pulse in right foot at the end of the groin compression.

## 2015-10-29 NOTE — Progress Notes (Signed)
Peripherally Inserted Central Catheter/Midline Placement  The IV Nurse has discussed with the patient and/or persons authorized to consent for the patient, the purpose of this procedure and the potential benefits and risks involved with this procedure.  The benefits include less needle sticks, lab draws from the catheter and patient may be discharged home with the catheter.  Risks include, but not limited to, infection, bleeding, blood clot (thrombus formation), and puncture of an artery; nerve damage and irregular heat beat.  Alternatives to this procedure were also discussed.  PICC/Midline Placement Documentation  PICC Double Lumen 10/29/15 PICC Right Cephalic 43 cm 0 cm (Active)  Indication for Insertion or Continuance of Line Vasoactive infusions 10/29/2015  3:23 PM  Exposed Catheter (cm) 0 cm 10/29/2015  3:23 PM  Site Assessment Clean;Dry;Intact 10/29/2015  3:23 PM  Lumen #1 Status Flushed;Saline locked;Blood return noted 10/29/2015  3:23 PM  Lumen #2 Status Flushed;Saline locked;Blood return noted 10/29/2015  3:23 PM  Dressing Type Transparent 10/29/2015  3:23 PM  Dressing Status Clean;Dry;Intact 10/29/2015  3:23 PM  Dressing Change Due 11/05/15 10/29/2015  3:23 PM       Ethelda Chick 10/29/2015, 3:25 PM

## 2015-10-29 NOTE — Progress Notes (Signed)
2 Days Post-Op Procedure(s) (LRB): CORONARY ARTERY BYPASS GRAFTING (CABG) x four, using left internal mammary artery and rt leg greater saphenous vein harvested endoscopically (N/A) TRANSESOPHAGEAL ECHOCARDIOGRAM (TEE) (N/A) RIGHT ENDARTERECTOMY CAROTID with patch angioplasty using Xenosure bovine pericardium patch (Right) Subjective: Extubated after CABG- Carotid endarterectomy preop RV infarct from prox RCA occlusion Postop IABP and inotrope requirement with intermittent heart block- CO-ox 57 % Extubated on O2 rebreather Postop fluid retension  Objective: Vital signs in last 24 hours: Temp:  [98.4 F (36.9 C)-99.7 F (37.6 C)] 98.4 F (36.9 C) (03/23 0815) Pulse Rate:  [35-100] 92 (03/23 0815) Cardiac Rhythm:  [-] Normal sinus rhythm (03/23 0700) Resp:  [13-26] 20 (03/23 0815) BP: (84-124)/(50-103) 105/81 mmHg (03/23 0815) SpO2:  [91 %-99 %] 96 % (03/23 0815) Arterial Line BP: (82-157)/(54-87) 150/87 mmHg (03/23 0815) FiO2 (%):  [40 %-55 %] 55 % (03/23 0100) Weight:  [254 lb 10.1 oz (115.5 kg)] 254 lb 10.1 oz (115.5 kg) (03/23 0550)  Hemodynamic parameters for last 24 hours: PAP: (31-46)/(21-36) 44/31 mmHg CVP:  [14 mmHg-20 mmHg] 17 mmHg CO:  [4.6 L/min-6.1 L/min] 5.7 L/min CI:  [2 L/min/m2-2.7 L/min/m2] 2.5 L/min/m2  Intake/Output from previous day: 03/22 0701 - 03/23 0700 In: 3502.5 [P.O.:1160; I.V.:1762.5; NG/GT:30; IV Piggyback:550] Out: 2345 [Urine:1795; Drains:10; Chest Tube:540] Intake/Output this shift:        Exam    General- alert and comfortable- supine in bed w/ IABP   Lungs- scattered rales, wheezes   Cor- regular rate and rhythm, + S4, gallop   Abdomen- soft, non-tender, sl distended   Extremities - warm, non-tender, 2+edema   Neuro- oriented, appropriate, no focal weakness, R neck drain still in place    Lab Results:  Recent Labs  10/28/15 1613 10/28/15 1620 10/29/15 0313  WBC 15.1*  --  15.7*  HGB 9.6* 10.9* 10.1*  HCT 29.1* 32.0* 29.6*   PLT 152  --  142*   BMET:  Recent Labs  10/28/15 0220  10/28/15 1620 10/29/15 0313  NA 133*  --  134* 132*  K 4.5  --  4.8 5.2*  CL 104  --  100* 100*  CO2 23  --   --  23  GLUCOSE 132*  --  143* 146*  BUN 14  --  18 18  CREATININE 1.21  < > 1.10 1.32*  CALCIUM 8.4*  --   --  8.4*  < > = values in this interval not displayed.  PT/INR:  Recent Labs  10/29/15 0313  LABPROT 21.0*  INR 1.82*   ABG    Component Value Date/Time   PHART 7.409 10/29/2015 0402   HCO3 23.8 10/29/2015 0402   TCO2 25 10/29/2015 0402   ACIDBASEDEF 1.0 10/29/2015 0402   O2SAT 57.4 10/29/2015 0404   CBG (last 3)   Recent Labs  10/28/15 1939 10/29/15 0007 10/29/15 0400  GLUCAP 140* 142* 129*    Assessment/Plan: S/P Procedure(s) (LRB): CORONARY ARTERY BYPASS GRAFTING (CABG) x four, using left internal mammary artery and rt leg greater saphenous vein harvested endoscopically (N/A) TRANSESOPHAGEAL ECHOCARDIOGRAM (TEE) (N/A) RIGHT ENDARTERECTOMY CAROTID with patch angioplasty using Xenosure bovine pericardium patch (Right) Diuresis Diabetes control d/c tubes/lines remove IABP today  Mobilize after IABP out Cont inotropes for RV infarct- follow CVP (17)   LOS: 9 days    Kathlee Nations Trigt III 10/29/2015

## 2015-10-30 ENCOUNTER — Inpatient Hospital Stay (HOSPITAL_COMMUNITY): Payer: BLUE CROSS/BLUE SHIELD

## 2015-10-30 ENCOUNTER — Inpatient Hospital Stay (HOSPITAL_COMMUNITY): Payer: BLUE CROSS/BLUE SHIELD | Admitting: Certified Registered"

## 2015-10-30 ENCOUNTER — Inpatient Hospital Stay: Payer: BLUE CROSS/BLUE SHIELD | Admitting: Certified Registered"

## 2015-10-30 ENCOUNTER — Other Ambulatory Visit (HOSPITAL_COMMUNITY): Payer: BLUE CROSS/BLUE SHIELD

## 2015-10-30 DIAGNOSIS — R079 Chest pain, unspecified: Secondary | ICD-10-CM

## 2015-10-30 DIAGNOSIS — I469 Cardiac arrest, cause unspecified: Secondary | ICD-10-CM

## 2015-10-30 DIAGNOSIS — Z951 Presence of aortocoronary bypass graft: Secondary | ICD-10-CM

## 2015-10-30 LAB — GLUCOSE, CAPILLARY
GLUCOSE-CAPILLARY: 80 mg/dL (ref 65–99)
Glucose-Capillary: 114 mg/dL — ABNORMAL HIGH (ref 65–99)
Glucose-Capillary: 122 mg/dL — ABNORMAL HIGH (ref 65–99)
Glucose-Capillary: 135 mg/dL — ABNORMAL HIGH (ref 65–99)
Glucose-Capillary: 81 mg/dL (ref 65–99)

## 2015-10-30 LAB — POCT I-STAT 3, ART BLOOD GAS (G3+)
ACID-BASE EXCESS: 1 mmol/L (ref 0.0–2.0)
Acid-base deficit: 2 mmol/L (ref 0.0–2.0)
BICARBONATE: 22 meq/L (ref 20.0–24.0)
BICARBONATE: 25 meq/L — AB (ref 20.0–24.0)
Bicarbonate: 24.3 mEq/L — ABNORMAL HIGH (ref 20.0–24.0)
O2 Saturation: 91 %
O2 Saturation: 97 %
O2 Saturation: 97 %
PCO2 ART: 36.1 mmHg (ref 35.0–45.0)
PO2 ART: 61 mmHg — AB (ref 80.0–100.0)
PO2 ART: 89 mmHg (ref 80.0–100.0)
Patient temperature: 36.8
Patient temperature: 98.4
TCO2: 23 mmol/L (ref 0–100)
TCO2: 25 mmol/L (ref 0–100)
TCO2: 26 mmol/L (ref 0–100)
pCO2 arterial: 35.9 mmHg (ref 35.0–45.0)
pCO2 arterial: 36.2 mmHg (ref 35.0–45.0)
pH, Arterial: 7.395 (ref 7.350–7.450)
pH, Arterial: 7.435 (ref 7.350–7.450)
pH, Arterial: 7.448 (ref 7.350–7.450)
pO2, Arterial: 86 mmHg (ref 80.0–100.0)

## 2015-10-30 LAB — TYPE AND SCREEN
ABO/RH(D): A POS
Antibody Screen: NEGATIVE
Unit division: 0
Unit division: 0
Unit division: 0
Unit division: 0
Unit division: 0
Unit division: 0

## 2015-10-30 LAB — CBC
HCT: 30.5 % — ABNORMAL LOW (ref 39.0–52.0)
Hemoglobin: 9.9 g/dL — ABNORMAL LOW (ref 13.0–17.0)
MCH: 30 pg (ref 26.0–34.0)
MCHC: 32.5 g/dL (ref 30.0–36.0)
MCV: 92.4 fL (ref 78.0–100.0)
Platelets: 153 10*3/uL (ref 150–400)
RBC: 3.3 MIL/uL — ABNORMAL LOW (ref 4.22–5.81)
RDW: 15.7 % — ABNORMAL HIGH (ref 11.5–15.5)
WBC: 18.1 10*3/uL — ABNORMAL HIGH (ref 4.0–10.5)

## 2015-10-30 LAB — CARBOXYHEMOGLOBIN
Carboxyhemoglobin: 0.9 % (ref 0.5–1.5)
Carboxyhemoglobin: 1.2 % (ref 0.5–1.5)
Carboxyhemoglobin: 0.6 % (ref 0.5–1.5)
Methemoglobin: 1 % (ref 0.0–1.5)
Methemoglobin: 0.9 % (ref 0.0–1.5)
Methemoglobin: 1 % (ref 0.0–1.5)
O2 Saturation: 43.7 %
O2 Saturation: 47.6 %
O2 Saturation: 41.5 %
Total hemoglobin: 8.3 g/dL — ABNORMAL LOW (ref 13.5–18.0)
Total hemoglobin: 8.7 g/dL — ABNORMAL LOW (ref 13.5–18.0)
Total hemoglobin: 13.2 g/dL — ABNORMAL LOW (ref 13.5–18.0)

## 2015-10-30 LAB — BASIC METABOLIC PANEL
Anion gap: 16 — ABNORMAL HIGH (ref 5–15)
Anion gap: 9 (ref 5–15)
Anion gap: 13 (ref 5–15)
BUN: 24 mg/dL — ABNORMAL HIGH (ref 6–20)
BUN: 25 mg/dL — ABNORMAL HIGH (ref 6–20)
BUN: 36 mg/dL — ABNORMAL HIGH (ref 6–20)
CO2: 18 mmol/L — ABNORMAL LOW (ref 22–32)
CO2: 24 mmol/L (ref 22–32)
CO2: 20 mmol/L — ABNORMAL LOW (ref 22–32)
Calcium: 7.9 mg/dL — ABNORMAL LOW (ref 8.9–10.3)
Calcium: 8.8 mg/dL — ABNORMAL LOW (ref 8.9–10.3)
Calcium: 7.9 mg/dL — ABNORMAL LOW (ref 8.9–10.3)
Chloride: 96 mmol/L — ABNORMAL LOW (ref 101–111)
Chloride: 99 mmol/L — ABNORMAL LOW (ref 101–111)
Chloride: 98 mmol/L — ABNORMAL LOW (ref 101–111)
Creatinine, Ser: 1.46 mg/dL — ABNORMAL HIGH (ref 0.61–1.24)
Creatinine, Ser: 1.52 mg/dL — ABNORMAL HIGH (ref 0.61–1.24)
Creatinine, Ser: 1.75 mg/dL — ABNORMAL HIGH (ref 0.61–1.24)
GFR calc Af Amer: 58 mL/min — ABNORMAL LOW (ref >60)
GFR calc Af Amer: >60 mL/min (ref >60)
GFR calc Af Amer: 49 mL/min — ABNORMAL LOW (ref >60)
GFR calc non Af Amer: 50 mL/min — ABNORMAL LOW (ref >60)
GFR calc non Af Amer: 53 mL/min — ABNORMAL LOW (ref >60)
GFR calc non Af Amer: 42 mL/min — ABNORMAL LOW (ref >60)
Glucose, Bld: 130 mg/dL — ABNORMAL HIGH (ref 65–99)
Glucose, Bld: 149 mg/dL — ABNORMAL HIGH (ref 65–99)
Glucose, Bld: 142 mg/dL — ABNORMAL HIGH (ref 65–99)
Potassium: 4 mmol/L (ref 3.5–5.1)
Potassium: 4.9 mmol/L (ref 3.5–5.1)
Potassium: 4.1 mmol/L (ref 3.5–5.1)
Sodium: 130 mmol/L — ABNORMAL LOW (ref 135–145)
Sodium: 132 mmol/L — ABNORMAL LOW (ref 135–145)
Sodium: 131 mmol/L — ABNORMAL LOW (ref 135–145)

## 2015-10-30 LAB — POCT I-STAT, CHEM 8
BUN: 27 mg/dL — AB (ref 6–20)
BUN: 33 mg/dL — AB (ref 6–20)
CHLORIDE: 95 mmol/L — AB (ref 101–111)
CREATININE: 1.4 mg/dL — AB (ref 0.61–1.24)
CREATININE: 1.6 mg/dL — AB (ref 0.61–1.24)
Calcium, Ion: 1.06 mmol/L — ABNORMAL LOW (ref 1.12–1.23)
Calcium, Ion: 1.05 mmol/L — ABNORMAL LOW (ref 1.12–1.23)
Chloride: 93 mmol/L — ABNORMAL LOW (ref 101–111)
GLUCOSE: 96 mg/dL (ref 65–99)
Glucose, Bld: 182 mg/dL — ABNORMAL HIGH (ref 65–99)
HEMATOCRIT: 30 % — AB (ref 39.0–52.0)
HEMATOCRIT: 25 % — AB (ref 39.0–52.0)
Hemoglobin: 10.2 g/dL — ABNORMAL LOW (ref 13.0–17.0)
Hemoglobin: 8.5 g/dL — ABNORMAL LOW (ref 13.0–17.0)
POTASSIUM: 4.3 mmol/L (ref 3.5–5.1)
POTASSIUM: 4.2 mmol/L (ref 3.5–5.1)
Sodium: 130 mmol/L — ABNORMAL LOW (ref 135–145)
Sodium: 130 mmol/L — ABNORMAL LOW (ref 135–145)
TCO2: 20 mmol/L (ref 0–100)
TCO2: 23 mmol/L (ref 0–100)

## 2015-10-30 LAB — HEPATIC FUNCTION PANEL
ALT: 41 U/L (ref 17–63)
AST: 47 U/L — ABNORMAL HIGH (ref 15–41)
Albumin: 3.1 g/dL — ABNORMAL LOW (ref 3.5–5.0)
Alkaline Phosphatase: 59 U/L (ref 38–126)
Bilirubin, Direct: 0.7 mg/dL — ABNORMAL HIGH (ref 0.1–0.5)
Indirect Bilirubin: 1.2 mg/dL — ABNORMAL HIGH (ref 0.3–0.9)
Total Bilirubin: 1.9 mg/dL — ABNORMAL HIGH (ref 0.3–1.2)
Total Protein: 6 g/dL — ABNORMAL LOW (ref 6.5–8.1)

## 2015-10-30 LAB — TROPONIN I: Troponin I: 2.01 ng/mL (ref <0.031)

## 2015-10-30 LAB — MAGNESIUM
Magnesium: 2.5 mg/dL — ABNORMAL HIGH (ref 1.7–2.4)
Magnesium: 2.1 mg/dL (ref 1.7–2.4)

## 2015-10-30 LAB — PREPARE FRESH FROZEN PLASMA: Unit division: 0

## 2015-10-30 LAB — ECHOCARDIOGRAM COMPLETE
Height: 70 in
Weight: 4081.16 oz

## 2015-10-30 LAB — TSH: TSH: 7.432 u[IU]/mL — ABNORMAL HIGH (ref 0.350–4.500)

## 2015-10-30 MED ORDER — AMIODARONE HCL IN DEXTROSE 360-4.14 MG/200ML-% IV SOLN
INTRAVENOUS | Status: AC
Start: 2015-10-30 — End: 2015-10-30
  Filled 2015-10-30: qty 200

## 2015-10-30 MED ORDER — ANTISEPTIC ORAL RINSE SOLUTION (CORINZ)
7.0000 mL | Freq: Four times a day (QID) | OROMUCOSAL | Status: DC
  Administered 2015-10-30 – 2015-11-22 (×82): 7 mL via OROMUCOSAL

## 2015-10-30 MED ORDER — AMIODARONE HCL IN DEXTROSE 360-4.14 MG/200ML-% IV SOLN
INTRAVENOUS | Status: AC
  Filled 2015-10-30: qty 200

## 2015-10-30 MED ORDER — MAGNESIUM SULFATE 2 GM/50ML IV SOLN
2.0000 g | Freq: Once | INTRAVENOUS | Status: AC
  Administered 2015-10-30: 2 g via INTRAVENOUS
  Filled 2015-10-30: qty 50

## 2015-10-30 MED ORDER — DEXMEDETOMIDINE HCL IN NACL 400 MCG/100ML IV SOLN
0.4000 ug/kg/h | INTRAVENOUS | Status: DC
  Administered 2015-10-30 – 2015-11-02 (×10): 0.7 ug/kg/h via INTRAVENOUS
  Filled 2015-10-30 (×12): qty 100

## 2015-10-30 MED ORDER — FUROSEMIDE 10 MG/ML IJ SOLN
80.0000 mg | Freq: Two times a day (BID) | INTRAMUSCULAR | Status: DC
  Administered 2015-10-30: 80 mg via INTRAVENOUS
  Filled 2015-10-30 (×2): qty 8

## 2015-10-30 MED ORDER — LIDOCAINE HCL (CARDIAC) 20 MG/ML IV SOLN
100.0000 mg | Freq: Once | INTRAVENOUS | Status: AC
  Administered 2015-10-30: 100 mg via INTRAVENOUS

## 2015-10-30 MED ORDER — PERFLUTREN LIPID MICROSPHERE
INTRAVENOUS | Status: AC
  Filled 2015-10-30: qty 10

## 2015-10-30 MED ORDER — AMIODARONE HCL IN DEXTROSE 360-4.14 MG/200ML-% IV SOLN: 60.0000 mg/h | INTRAVENOUS | Status: DC

## 2015-10-30 MED ORDER — PERFLUTREN LIPID MICROSPHERE
1.0000 mL | INTRAVENOUS | Status: AC | PRN
  Administered 2015-10-30: 2 mL via INTRAVENOUS
  Filled 2015-10-30: qty 10

## 2015-10-30 MED ORDER — SUCCINYLCHOLINE CHLORIDE 20 MG/ML IJ SOLN
INTRAMUSCULAR | Status: DC | PRN
  Administered 2015-10-30: 120 mg via INTRAVENOUS

## 2015-10-30 MED ORDER — FUROSEMIDE 10 MG/ML IJ SOLN
40.0000 mg | Freq: Once | INTRAMUSCULAR | Status: AC
  Administered 2015-10-30: 40 mg via INTRAVENOUS
  Filled 2015-10-30: qty 4

## 2015-10-30 MED ORDER — AMIODARONE HCL IN DEXTROSE 360-4.14 MG/200ML-% IV SOLN
30.0000 mg/h | INTRAVENOUS | Status: DC
  Administered 2015-10-30 – 2015-11-15 (×33): 30 mg/h via INTRAVENOUS
  Filled 2015-10-30 (×37): qty 200

## 2015-10-30 MED ORDER — DEXMEDETOMIDINE HCL IN NACL 200 MCG/50ML IV SOLN
0.4000 ug/kg/h | INTRAVENOUS | Status: DC
  Administered 2015-10-30: 0.7 ug/kg/h via INTRAVENOUS
  Administered 2015-10-30: 0.5 ug/kg/h via INTRAVENOUS
  Administered 2015-10-30: 0.7 ug/kg/h via INTRAVENOUS
  Filled 2015-10-30 (×4): qty 50

## 2015-10-30 MED ORDER — PANTOPRAZOLE SODIUM 40 MG IV SOLR
40.0000 mg | Freq: Every day | INTRAVENOUS | Status: DC
  Administered 2015-10-30 – 2015-11-12 (×14): 40 mg via INTRAVENOUS
  Filled 2015-10-30 (×14): qty 40

## 2015-10-30 MED ORDER — LIDOCAINE IN D5W 4-5 MG/ML-% IV SOLN
1.0000 mg/min | INTRAVENOUS | Status: DC
  Administered 2015-10-30 – 2015-11-04 (×4): 1 mg/min via INTRAVENOUS
  Filled 2015-10-30 (×3): qty 500

## 2015-10-30 MED ORDER — AMIODARONE LOAD VIA INFUSION: 150.0000 mg | Freq: Once | INTRAVENOUS | Status: DC

## 2015-10-30 MED ORDER — SODIUM CHLORIDE 0.9 % IV SOLN: 25.0000 ug/h | INTRAVENOUS | Status: DC

## 2015-10-30 MED ORDER — CHLORHEXIDINE GLUCONATE 0.12% ORAL RINSE (MEDLINE KIT)
15.0000 mL | Freq: Two times a day (BID) | OROMUCOSAL | Status: DC
  Administered 2015-10-30 – 2015-11-21 (×44): 15 mL via OROMUCOSAL

## 2015-10-30 MED ORDER — NOREPINEPHRINE BITARTRATE 1 MG/ML IV SOLN: 0.0000 ug/min | INTRAVENOUS | Status: DC

## 2015-10-30 MED ORDER — MILRINONE IN DEXTROSE 20 MG/100ML IV SOLN
0.1250 ug/kg/min | INTRAVENOUS | Status: DC
  Administered 2015-10-30 – 2015-10-31 (×2): 0.3 ug/kg/min via INTRAVENOUS
  Administered 2015-10-31 – 2015-11-01 (×5): 0.4 ug/kg/min via INTRAVENOUS
  Administered 2015-11-02: 0.5 ug/kg/min via INTRAVENOUS
  Administered 2015-11-02: 0.375 ug/kg/min via INTRAVENOUS
  Administered 2015-11-02: 0.5 ug/kg/min via INTRAVENOUS
  Administered 2015-11-03 (×2): 0.25 ug/kg/min via INTRAVENOUS
  Administered 2015-11-03: 0.375 ug/kg/min via INTRAVENOUS
  Administered 2015-11-04 – 2015-11-10 (×13): 0.25 ug/kg/min via INTRAVENOUS
  Administered 2015-11-11: 0.125 ug/kg/min via INTRAVENOUS
  Administered 2015-11-11: 0.25 ug/kg/min via INTRAVENOUS
  Administered 2015-11-12: 0.125 ug/kg/min via INTRAVENOUS
  Filled 2015-10-30 (×28): qty 100

## 2015-10-30 MED ORDER — SODIUM CHLORIDE 0.9 % IV SOLN
25.0000 ug/h | INTRAVENOUS | Status: DC
  Administered 2015-10-30: 50 ug/h via INTRAVENOUS
  Administered 2015-10-31: 75 ug/h via INTRAVENOUS
  Administered 2015-11-02 – 2015-11-03 (×3): 125 ug/h via INTRAVENOUS
  Administered 2015-11-04 – 2015-11-06 (×5): 200 ug/h via INTRAVENOUS
  Administered 2015-11-07: 25 ug/h via INTRAVENOUS
  Administered 2015-11-07: 200 ug/h via INTRAVENOUS
  Administered 2015-11-08: 300 ug/h via INTRAVENOUS
  Administered 2015-11-08: 50 ug/h via INTRAVENOUS
  Filled 2015-10-30 (×13): qty 50

## 2015-10-30 MED ORDER — ETOMIDATE 2 MG/ML IV SOLN
INTRAVENOUS | Status: DC | PRN
  Administered 2015-10-30: 12 mg via INTRAVENOUS

## 2015-10-30 MED FILL — Medication: Qty: 1 | Status: AC

## 2015-10-30 NOTE — Consult Note (Signed)
Advanced Heart Failure Team Consult Note  Referring Physician: Dr Darcey Nora Primary Physician: none Primary Cardiologist:  Dr Burt Knack   Reason for Consultation: Heart Failure   HPI:   Christopher [name] is a 55 year old with a history of HTN, TIA, CHB, DMII, and marijuana use. On March 14th he presented to [facility] with increased dyspnea. On arrival he had inferior MI and CHB. Urgently  transferred to [facility] for cath. LHC Ramus lesion, 70% stenosed, Prox LAD lesion, 95% stenosed, and prox RCA lesion, 100% stenosed. Developed pulmonary edema post cath and was diuresed IV lasix.   Cardiac Surgery consulted---10/27/15 S/P CABG x4 LIMA to LAD, SVG to DIAGONAL, SVG to RAMUS, INTERMEDIATE, SVG to RDA 3 and R  CEA. Extubated 3/22. IABP removed 3/23 and remained on dopamine, milrinone, and epi. Over night had VF arrest requiring CPR shock x4. Re-intubated. CXR-infiltrate R lung base.  Reintubated. Started on milrinone 0.2 mcg, amio drip, norepi. Today CO-OX 48%. WBC trending up 15>18.  Nitric oxide restarted at 10 ppm.   ECHO 10/02/15 EF 25-30% RV normal AK entire anteroseptal and inferoseptal myocardium.  TEE 10/27/2015: EF 35-40% RV mildly dilated TR mod regurg.    Review of Systems: [y] = yes, [ ]  = no Intubated   General: Weight gain [ ] ; Weight loss [ ] ; Anorexia [ ] ; Fatigue [ ] ; Fever [ ] ; Chills [ ] ; Weakness [ ]   Cardiac: Chest pain/pressure [ ] ; Resting SOB [ ] ; Exertional SOB [ ] ; Orthopnea [ ] ; Pedal Edema [ ] ; Palpitations [ ] ; Syncope [ ] ; Presyncope [ ] ; Paroxysmal nocturnal dyspnea[ ]   Pulmonary: Cough [ ] ; Wheezing[ ] ; Hemoptysis[ ] ; Sputum [ ] ; Snoring [ ]   GI: Vomiting[ ] ; Dysphagia[ ] ; Melena[ ] ; Hematochezia [ ] ; Heartburn[ ] ; Abdominal pain [ ] ; Constipation [ ] ; Diarrhea [ ] ; BRBPR [ ]   GU: Hematuria[ ] ; Dysuria [ ] ; Nocturia[ ]   Vascular: Pain in legs with walking [ ] ; Pain in feet with lying flat [ ] ; Non-healing sores [ ] ; Stroke [ ] ; TIA [ ] ; Slurred speech [ ] ;  Neuro: Headaches[ ] ;  Vertigo[ ] ; Seizures[ ] ; Paresthesias[ ] ;Blurred vision [ ] ; Diplopia [ ] ; Vision changes [ ]   Ortho/Skin: Arthritis [ ] ; Joint pain [ ] ; Muscle pain [ ] ; Joint swelling [ ] ; Back Pain [ ] ; Rash [ ]   Psych: Depression[ ] ; Anxiety[ ]   Heme: Bleeding problems [ ] ; Clotting disorders [ ] ; Anemia [ ]   Endocrine: Diabetes [ ] ; Thyroid dysfunction[ ]   Home Medications Prior to Admission medications   Medication Sig Start Date End Date Taking? Authorizing Provider  albuterol (PROAIR HFA) 108 (90 Base) MCG/ACT inhaler Inhale 2 puffs into the lungs every 6 (six) hours as needed. Shortness of breath 10/14/15 10/13/16 Yes Historical Provider, MD  aspirin 325 MG tablet Take 325 mg by mouth daily.   Yes Historical Provider, MD  lisinopril (PRINIVIL,ZESTRIL) 20 MG tablet Take 1 tablet (20 mg total) by mouth daily. 12/25/14  Yes Donika K Patel, DO  lovastatin (MEVACOR) 20 MG tablet Take 1 tablet (20 mg total) by mouth at bedtime. 12/25/14  Yes Alda Berthold, DO    Past Medical History: Past Medical History  Diagnosis Date  . Hypertension   . TIA (transient ischemic attack)   . Stenosis of right carotid artery   . Complete heart block ([place]) 10/20/2015    Past Surgical History: Past Surgical History  Procedure Laterality Date  . Arm surgery Right 2003  . Eye surgery    .  Laceration repair    . Cardiac catheterization N/A 10/20/2015    Procedure: Left Heart Cath and Coronary Angiography;  Surgeon: Sherren Mocha, MD;  Location: [facility];  Service: Cardiovascular;  Laterality: N/A;  . Coronary artery bypass graft N/A 10/27/2015    Procedure: CORONARY ARTERY BYPASS GRAFTING (CABG) x four, using left internal mammary artery and rt leg greater saphenous vein harvested endoscopically;  Surgeon: Ivin Poot, MD;  Location: [place];  Service: Open Heart Surgery;  Laterality: N/A;  . Tee without cardioversion N/A 10/27/2015    Procedure: TRANSESOPHAGEAL ECHOCARDIOGRAM (TEE);  Surgeon: Ivin Poot,  MD;  Location: [place];  Service: Open Heart Surgery;  Laterality: N/A;  . Endarterectomy Right 10/27/2015    Procedure: RIGHT ENDARTERECTOMY CAROTID with patch angioplasty using Xenosure bovine pericardium patch;  Surgeon: Conrad New Lothrop, MD;  Location: [facility] OR;  Service: Vascular;  Laterality: Right;    Family History: Family History  Problem Relation Age of Onset  . Heart disease Mother   . Failure to thrive Father   . Congestive Heart Failure Father     Deceased  . Breast cancer Sister     Living    Social History: Social History   Social History  . Marital Status: Single    Spouse Name: N/A  . Number of Children: N/A  . Years of Education: N/A   Social History Main Topics  . Smoking status: Former Smoker    Quit date: 03/23/2002  . Smokeless tobacco: Never Used  . 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 Drug Use: Yes    Special: Marijuana     Comment: Daily   . Sexual Activity: Not Asked   Other Topics Concern  . None   Social History Narrative   Lives with girlfriend in an apartment on the second floor.     Works as a Therapist, music for apartment complex.     Has no children.  Education: 10th grade.    Allergies:  Allergies  Allergen Reactions  . Losartan Shortness Of Breath  . Lisinopril Rash    Objective:   Anatomy   Scheduled Meds: . acetaminophen  1,000 mg Oral 4 times per day   Or  . acetaminophen (TYLENOL) oral liquid 160 mg/5 mL  1,000 mg Per Tube 4 times per day  . amiodarone      . amiodarone      . amiodarone  150 mg Intravenous Once  . antiseptic oral rinse  7 mL Mouth Rinse QID  . aspirin EC  325 mg Oral Daily  . atorvastatin  80 mg Oral q1800  . bisacodyl  10 mg Oral Daily   Or  . bisacodyl  10 mg Rectal Daily  . chlorhexidine gluconate (SAGE KIT)  15 mL Mouth Rinse BID  . docusate sodium  200 mg Oral Daily  . enoxaparin (LOVENOX) injection  40 mg  Subcutaneous Q24H  . furosemide  40 mg Intravenous BID  . insulin aspart  0-24 Units Subcutaneous 6 times per day  . insulin detemir  12 Units Subcutaneous BID  . levalbuterol  1.25 mg Nebulization TID  . metoCLOPramide (REGLAN) injection  10 mg Intravenous 4 times per day  . metolazone  5 mg Oral Daily  . mometasone-formoterol  2 puff Inhalation BID  . pantoprazole (PROTONIX) IV  40 mg Intravenous Daily  . sodium chloride flush  10-40 mL Intracatheter Q12H  . sodium chloride flush  3 mL Intravenous Q12H   Continuous Infusions: . sodium chloride    . sodium chloride 10 mL/hr at 10/30/15 1100  . sodium chloride 10 mL/hr at 10/30/15 1100  . amiodarone 30 mg/hr (10/30/15 0700)  . dexmedetomidine 0.7 mcg/kg/hr (10/30/15 1100)  . DOPamine Stopped (10/29/15 1800)  . fentaNYL infusion INTRAVENOUS 75 mcg/hr (10/30/15 1100)  . lactated ringers Stopped (10/29/15 1700)  . lactated ringers    . milrinone 0.2 mcg/kg/min (10/30/15 1100)  . norepinephrine (LEVOPHED) Adult infusion 3 mcg/min (10/30/15 1100)   PRN Meds:.albuterol, metoprolol, midazolam, morphine injection, ondansetron (ZOFRAN) IV, oxyCODONE, sodium chloride flush, sodium chloride flush, traMADol Vital Signs:   Temp:  [96.2 F (35.7 C)-99 F (37.2 C)] 96.2 F (35.7 C) (03/24 0901) Pulse Rate:  [28-124] 90 (03/24 0918) Resp:  [11-30] 17 (03/24 1000) BP: (74-170)/(48-94) 90/58 mmHg (03/24 1000) SpO2:  [90 %-100 %] 100 % (03/24 0939) Arterial Line BP: (73-217)/(48-94) 100/59 mmHg (03/24 1000) FiO2 (%):  [80 %-100 %] 80 % (03/24 0939) Weight:  [255 lb 1.2 oz (115.7 kg)] 255 lb 1.2 oz (115.7 kg) (03/24 0450) Last BM Date: 10/26/15  Weight change: Filed Weights   10/28/15 0515 10/29/15 0550 10/30/15 0450  Weight: 250 lb 10.6 oz (113.7 kg) 254 lb 10.1 oz (115.5 kg) 255 lb 1.2 oz (115.7 kg)    Intake/Output:   Intake/Output Summary (Last 24 hours) at 10/30/15 1101 Last data filed at 10/30/15 0950  Gross per 24 hour  Intake  797.08 ml  Output   2585 ml  Net -1787.92 ml     Physical Exam: CVP 25  General:  Intubated. Awake   HEENT: normal Neck: supple. JVP to ear  . Carotids 2+ bilat; no bruits. No lymphadenopathy or thryomegaly appreciated. Cor: PMI nondisplaced. Regular rate & rhythm. No rubs, gallops or murmurs. Sternal dressing CDI Lungs: Coarse throughout Abdomen: soft, nontender, nondistended. No hepatosplenomegaly. No bruits or masses. Good bowel sounds. Extremities: no cyanosis, clubbing, rash, R and LLE 2+ Extremities cool. R and LLE SCDs.  Neuro: Intubated follows commands.  GU: Foley   Telemetry: AV paced 90   Labs: Basic Metabolic Panel:  Recent Labs Lab 10/27/15 0446  10/28/15 0220 10/28/15 1613  10/29/15 0313 10/29/15 1637 10/30/15 0045 10/30/15 0124 10/30/15 0420  NA 135  < > 133*  --   < > 132* 130* 130* 130* 132*  K 4.3  < > 4.5  --   < > 5.2* 4.3 4.9 4.3 4.0  CL 102  < > 104  --   < > 100* 94* 96* 93* 99*  CO2 22  --  23  --   --  23  --  18*  --  24  GLUCOSE 100*  < > 132*  --   < > 146* 123* 149* 182* 130*  BUN 16  < > 14  --   < > 18 22* 24* 27* 25*  CREATININE 1.15  < > 1.21 1.25*  < > 1.32* 1.10 1.52* 1.40* 1.46*  CALCIUM 8.8*  --  8.4*  --   --  8.4*  --  8.8*  --  7.9*  MG  --   --  2.5* 2.2  --   --   --  2.5*  --  2.1  < > = values in this interval not displayed.  Liver Function Tests:  Recent Labs Lab 10/29/15 0313 10/30/15 0045  AST 37 47*  ALT 43 41  ALKPHOS 46 59  BILITOT 2.0* 1.9*  PROT 5.7* 6.0*  ALBUMIN 3.1* 3.1*   No results for input(s): LIPASE, AMYLASE in the last 168 hours. No results for input(s): AMMONIA in the last 168 hours.  CBC:  Recent Labs Lab 10/27/15 1750  10/28/15 0220 10/28/15 1613 10/28/15 1620 10/29/15 0313 10/29/15 1637 10/30/15 0124 10/30/15 0426  WBC 17.2*  --  12.9* 15.1*  --  15.7*  --   --  18.1*  HGB 11.6*  < > 10.1* 9.6* 10.9* 10.1* 9.9* 10.2* 9.9*  HCT 35.0*  < > 30.2* 29.1* 32.0* 29.6* 29.0* 30.0* 30.5*   MCV 90.7  --  89.9 90.4  --  91.9  --   --  92.4  PLT 153  --  161 152  --  142*  --   --  153  < > = values in this interval not displayed.  Cardiac Enzymes:  Recent Labs Lab 10/30/15 0045  TROPONINI 2.01*    BNP: BNP (last 3 results)  Recent Labs  10/20/15 1745 10/20/15 2030  BNP 1390.0* 1396.4*    ProBNP (last 3 results) No results for input(s): PROBNP in the last 8760 hours.   CBG:  Recent Labs Lab 10/29/15 1550 10/29/15 1908 10/29/15 2334 10/30/15 0339 10/30/15 0859  GLUCAP 110* 122* 114* 135* 80    Coagulation Studies:  Recent Labs  10/27/15 1750 10/29/15 0313  LABPROT 19.0* 21.0*  INR 1.59* 1.82*    Other results: EKG:.  Imaging: Dg Chest Port 1 View  10/30/2015  CLINICAL DATA:  Status post coronary bypass grafting EXAM: PORTABLE CHEST 1 VIEW COMPARISON:  10/30/2015 FINDINGS: Cardiac shadow remains enlarged. Bilateral chest tubes are again seen. A right-sided PICC line and endotracheal tube are noted in satisfactory position. Left jugular sheath is noted. Elevation of left hemidiaphragm is noted with left basilar atelectasis. No pneumothorax is noted. IMPRESSION: Postsurgical change with tubes and lines as described. Mild left basilar atelectasis. Electronically Signed   By: Inez Catalina M.D.   On: 10/30/2015 07:45   Dg Chest Port 1 View  10/30/2015  CLINICAL DATA:  A chest tube in place EXAM: PORTABLE CHEST 1 VIEW COMPARISON:  10/30/2015 FINDINGS: Postoperative changes in the mediastinum. Endotracheal tube with tip measuring 6.3 cm about the carina. Right PICC catheter with tip over the cavoatrial junction. Left central venous catheter sheath with tip over the left neck consistent location in the internal jugular vein. Bilateral chest tubes. Infiltration or atelectasis in the right lung base. Cardiac enlargement. No significant vascular congestion. No pneumothorax. IMPRESSION: Appliance positioned as described. Cardiac enlargement with infiltration or  atelectasis in the right lung base. Bilateral chest tubes are present but no visualized pneumothorax. Electronically Signed   By: Lucienne Capers M.D.   On: 10/30/2015 03:07   Dg Chest Port 1 View  10/30/2015  CLINICAL DATA:  Shortness of breath.  Code blue. EXAM: PORTABLE CHEST 1 VIEW COMPARISON:  10/29/2015 FINDINGS: Postoperative changes in the mediastinum. Bilateral chest tubes are present. Right PICC line with tip over the cavoatrial junction. Left central venous catheter or sheath with tip over the left side of the neck, likely in the left internal jugular vein. Cardiac enlargement without significant vascular congestion. Shallow inspiration with elevation of the left hemidiaphragm. Probable left pleural effusion. No definite pulmonary consolidation. IMPRESSION: Shallow inspiration. Probable left pleural effusion. Cardiac enlargement. Appliances appear in satisfactory position. Electronically Signed   By: Lucienne Capers M.D.   On: 10/30/2015  01:18   Dg Chest Port 1 View  10/29/2015  CLINICAL DATA:  Postop CABG 2 days ago. EXAM: PORTABLE CHEST 1 VIEW COMPARISON:  Portable chest x-ray of October 28, 2015 FINDINGS: The trachea and esophagus have been extubated. The cardiac silhouette remains enlarged. The pulmonary vascularity is mildly engorged. The bilateral chest tubes and the mediastinal drain are in stable position. There is no pneumothorax or large pleural effusion. The Swan-Ganz catheter tip overlies a proximal right lower lobe pulmonary artery branch. The mediastinum is widened and accentuated by the hypo inflation. IMPRESSION: Bilateral hypoinflation following extubation of the trachea there is crowding of the pulmonary vascularity. Left basilar atelectasis or less likely pneumonia is more prominent today. Probable small left pleural effusion. The remaining support tubes and lines are in stable position. Electronically Signed   By: David  Martinique M.D.   On: 10/29/2015 07:40      Medications:      Current Medications: . acetaminophen  1,000 mg Oral 4 times per day   Or  . acetaminophen (TYLENOL) oral liquid 160 mg/5 mL  1,000 mg Per Tube 4 times per day  . amiodarone      . amiodarone      . amiodarone  150 mg Intravenous Once  . antiseptic oral rinse  7 mL Mouth Rinse QID  . aspirin EC  325 mg Oral Daily  . atorvastatin  80 mg Oral q1800  . bisacodyl  10 mg Oral Daily   Or  . bisacodyl  10 mg Rectal Daily  . chlorhexidine gluconate (SAGE KIT)  15 mL Mouth Rinse BID  . docusate sodium  200 mg Oral Daily  . enoxaparin (LOVENOX) injection  40 mg Subcutaneous Q24H  . furosemide  40 mg Intravenous BID  . insulin aspart  0-24 Units Subcutaneous 6 times per day  . insulin detemir  12 Units Subcutaneous BID  . levalbuterol  1.25 mg Nebulization TID  . metoCLOPramide (REGLAN) injection  10 mg Intravenous 4 times per day  . metolazone  5 mg Oral Daily  . mometasone-formoterol  2 puff Inhalation BID  . pantoprazole (PROTONIX) IV  40 mg Intravenous Daily  . sodium chloride flush  10-40 mL Intracatheter Q12H  . sodium chloride flush  3 mL Intravenous Q12H     Infusions: . sodium chloride    . sodium chloride 10 mL/hr at 10/30/15 1100  . sodium chloride 10 mL/hr at 10/30/15 1100  . amiodarone 30 mg/hr (10/30/15 0700)  . dexmedetomidine 0.7 mcg/kg/hr (10/30/15 1100)  . DOPamine Stopped (10/29/15 1800)  . fentaNYL infusion INTRAVENOUS 75 mcg/hr (10/30/15 1100)  . lactated ringers Stopped (10/29/15 1700)  . lactated ringers    . milrinone 0.2 mcg/kg/min (10/30/15 1100)  . norepinephrine (LEVOPHED) Adult infusion 3 mcg/min (10/30/15 1100)      Assessment:  Christopher [name] is a 55 year old S/P CABG x4 and R CEA complicated by V fib arrest.   1. Acute Respiratory Failure:  reintubated 2/24 after Vfib 2. Cardiogenic Shock- on milrinone 0.2 mcg and norepi 3 mcg. CO-OX 47%. Increase milrione to 0.3 mcg. Repeat CO-OX this afternoon.  3. VF Arrest-- CPR Shock x4 3/24. On Amio drip  30 mg per hour. Keep K >4 and Mag >2 4. A/C Systolic Heart Failure- ICM. ECHO EF 25-30%  On Milrinone and Norepi. Increase milrinone to 0.3 mcg and continue norepi 0.22mg but may need to increase with diuresis. CVP 25. Increase lasix to 80 mg twice a day.  No BB  with shock.  5. CAD S/P CABG x4 on 3/21. On statin and aspirin.  6.  S/P R CEA on 3/21 7.  Hyponatremia  8.  Elevated WBC- WBC 15>18. No fever. Check cultures, blood and urine  9. CHB- Pacing at 90    Length of Stay: [place] NP-C  10/30/2015, 11:01 AM  Advanced Heart Failure Team Pager [phone] (M-F; [place])  Please contact [facility] for night-coverage after hours (4p -7a ) and weekends on [email or web address]  Patient seen with NP, agree with the above note.   1. CAD: S/p late presentation inferior MI and CABG x 4.  Continue statin, ASA.  2. Cardiogenic shock: Acute systolic CHF with prominent RV failure post inferior MI with RV involvement. EF 25-30% on echo.   - Co-ox 47% this morning => increase milrinone to 0.3 and will repeat co-ox this afternoon.  - Continue norepinephrine at 3 for now.  - He is back on NO at 10 ppm after decompensation last night in setting of RV failure.  - CVP 26, marked volume overload.  Will diurese today with Lasix 80 mg IV bid.  CVP 10-12 probably ideal with suspected RV infarct.  3. Complete heart block: PPM turned down today, appears to have underlying CHB.  PPM lower rate left at 80 bpm.  If he needs permanent pacer, will need BiV device.  4. VF arrest 3/24 early am: Remains on amiodarone gtt.   Loralie Champagne 10/30/2015 2:10 PM

## 2015-10-30 NOTE — Progress Notes (Signed)
  Amiodarone Drug - Drug Interaction Consult Note  Recommendations: MONITOR POTASSIUM, HR, AND SIGNS OF MUSCLE PAIN/WEAKNESS.  Amiodarone is metabolized by the cytochrome P450 system and therefore has the potential to cause many drug interactions. Amiodarone has an average plasma half-life of 50 days (range 20 to 100 days).   There is potential for drug interactions to occur several weeks or months after stopping treatment and the onset of drug interactions may be slow after initiating amiodarone.   [x]  Statins (atorvastatin): Increased risk of myopathy. Simvastatin- restrict dose to 20mg  daily. Other statins: counsel patients to report any muscle pain or weakness immediately.  []  Anticoagulants: Amiodarone can increase anticoagulant effect. Consider warfarin dose reduction. Patients should be monitored closely and the dose of anticoagulant altered accordingly, remembering that amiodarone levels take several weeks to stabilize.  []  Antiepileptics: Amiodarone can increase plasma concentration of phenytoin, the dose should be reduced. Note that small changes in phenytoin dose can result in large changes in levels. Monitor patient and counsel on signs of toxicity.  [x]  Beta blockers (Lopressor PRN): increased risk of bradycardia, AV block and myocardial depression. Sotalol - avoid concomitant use.  []   Calcium channel blockers (diltiazem and verapamil): increased risk of bradycardia, AV block and myocardial depression.  []   Cyclosporine: Amiodarone increases levels of cyclosporine. Reduced dose of cyclosporine is recommended.  []  Digoxin dose should be halved when amiodarone is started.  [x]  Diuretics (metolazone + furosemide): increased risk of cardiotoxicity if hypokalemia occurs. -- K+ has been on the high end, this am K+ in process.  []  Oral hypoglycemic agents (glyburide, glipizide, glimepiride): increased risk of hypoglycemia. Patient's glucose levels should be monitored closely when  initiating amiodarone therapy.   []  Drugs that prolong the QT interval:  Torsades de pointes risk may be increased with concurrent use - avoid if possible.  Monitor QTc, also keep magnesium/potassium WNL if concurrent therapy can't be avoided. Marland Kitchen Antibiotics: e.g. fluoroquinolones, erythromycin. . Antiarrhythmics: e.g. quinidine, procainamide, disopyramide, sotalol. . Antipsychotics: e.g. phenothiazines, haloperidol.  . Lithium, tricyclic antidepressants, and methadone.   Thank You,  Vernard Gambles, PharmD, BCPS  10/30/2015 12:52 AM

## 2015-10-30 NOTE — Progress Notes (Signed)
Called lab again d/t no 00:45 Bmet or CBC results yet, this time a different lady Selena Batten) said there weren't orders for a Bmet or CBC from that time. Going over my order history, I found that although there was no CBC ordered, there was indeed a Bmet ordered for 00:44. Selena Batten stated that they still had the blood sample from that time if we wanted to run the test now, but that I'd have to put a new Bmet order in. So I put the new Bmet order in, stayed on the line to make sure she saw it on her end.   Will continue to monitor.  Peyton Bottoms, RN 5:28 AM 10/30/2015

## 2015-10-30 NOTE — Progress Notes (Signed)
Patient ID: Christopher [name], male   DOB: 1961-03-19, 55 y.o.   MRN: [id number]  SICU Evening Rounds  He went into V-fib earlier this evening and required defib. He came right out of it but had recurrent episodes of NSVT and was given 150 mg bolus of amio and lidocaine 100 mg with drip 1 mg/min. His rhythm seems stable since. K+ and Mg2+ were normal. I suspect this is due to his recent MI involving the RV. He has been hemodynamically stable tonight, AV paced at 80 with slow heart block rhythm underneath. Will continue amio and lido for now. On milrinone 0.3, levo 3, NO 10 ppm. Co-ox 41.5 tonight. CXR after the VF looked unchanged from this am to me but sats took a while to come up. Vent on 80% peep 5, rate 12.  Urine output ok but creat trending up.  He remains awake and alert on fentanyl drip. BMET    Component Value Date/Time   NA 131* 10/30/2015 1800   K 4.1 10/30/2015 1800   CL 98* 10/30/2015 1800   CO2 20* 10/30/2015 1800   GLUCOSE 142* 10/30/2015 1800   BUN 36* 10/30/2015 1800   CREATININE 1.75* 10/30/2015 1800   CALCIUM 7.9* 10/30/2015 1800   GFRNONAA 42* 10/30/2015 1800   GFRAA 49* 10/30/2015 1800    CBC    Component Value Date/Time   WBC 18.1* 10/30/2015 0426   RBC 3.30* 10/30/2015 0426   HGB 8.5* 10/30/2015 1555   HCT 25.0* 10/30/2015 1555   PLT 153 10/30/2015 0426   MCV 92.4 10/30/2015 0426   MCH 30.0 10/30/2015 0426   MCHC 32.5 10/30/2015 0426   RDW 15.7* 10/30/2015 0426

## 2015-10-30 NOTE — Progress Notes (Signed)
3 Days Post-Op Procedure(s) (LRB): CORONARY ARTERY BYPASS GRAFTING (CABG) x four, using left internal mammary artery and rt leg greater saphenous vein harvested endoscopically (N/A) TRANSESOPHAGEAL ECHOCARDIOGRAM (TEE) (N/A) RIGHT ENDARTERECTOMY CAROTID with patch angioplasty using Xenosure bovine pericardium patch (Right) Subjective: VT, VF sudden event last pm- events noted preop DMI, RV infarct with prob scar mediated VT CVP this am > 20- will start nitric oxide since he is intubated A-V pacing for heart block Cont amiodarone CXR wet  Objective: Vital signs in last 24 hours: Temp:  [97.6 F (36.4 C)-99 F (37.2 C)] 98.3 F (36.8 C) (03/24 0400) Pulse Rate:  [28-124] 28 (03/24 0700) Cardiac Rhythm:  [-] Normal sinus rhythm (03/23 2100) Resp:  [11-30] 14 (03/24 0730) BP: (74-170)/(48-94) 94/64 mmHg (03/24 0430) SpO2:  [90 %-100 %] 99 % (03/24 0700) Arterial Line BP: (73-217)/(48-94) 95/59 mmHg (03/24 0730) FiO2 (%):  [80 %-100 %] 80 % (03/24 0356) Weight:  [255 lb 1.2 oz (115.7 kg)] 255 lb 1.2 oz (115.7 kg) (03/24 0450)  Hemodynamic parameters for last 24 hours: PAP: (37-50)/(27-38) 43/30 mmHg CVP:  [16 mmHg-34 mmHg] 25 mmHg CO:  [5.7 L/min] 5.7 L/min CI:  [2.5 L/min/m2] 2.5 L/min/m2  Intake/Output from previous day: 03/23 0701 - 03/24 0700 In: 1521.3 [P.O.:300; I.V.:941.3; Blood:280] Out: 3060 [Urine:2550; Chest Tube:510] Intake/Output this shift:   Neuro intact And soft mod edema   Lab Results:  Recent Labs  10/29/15 0313  10/30/15 0124 10/30/15 0426  WBC 15.7*  --   --  18.1*  HGB 10.1*  < > 10.2* 9.9*  HCT 29.6*  < > 30.0* 30.5*  PLT 142*  --   --  153  < > = values in this interval not displayed. BMET:  Recent Labs  10/30/15 0045 10/30/15 0124 10/30/15 0420  NA 130* 130* 132*  K 4.9 4.3 4.0  CL 96* 93* 99*  CO2 18*  --  24  GLUCOSE 149* 182* 130*  BUN 24* 27* 25*  CREATININE 1.52* 1.40* 1.46*  CALCIUM 8.8*  --  7.9*    PT/INR:  Recent  Labs  10/29/15 0313  LABPROT 21.0*  INR 1.82*   ABG    Component Value Date/Time   PHART 7.435 10/30/2015 0438   HCO3 24.3* 10/30/2015 0438   TCO2 25 10/30/2015 0438   ACIDBASEDEF 1.0 10/29/2015 0402   O2SAT 97.0 10/30/2015 0438   CBG (last 3)   Recent Labs  10/29/15 1908 10/29/15 2334 10/30/15 0339  GLUCAP 122* 114* 135*    Assessment/Plan: S/P Procedure(s) (LRB): CORONARY ARTERY BYPASS GRAFTING (CABG) x four, using left internal mammary artery and rt leg greater saphenous vein harvested endoscopically (N/A) TRANSESOPHAGEAL ECHOCARDIOGRAM (TEE) (N/A) RIGHT ENDARTERECTOMY CAROTID with patch angioplasty using Xenosure bovine pericardium patch (Right) Mobilize Diabetes control milrinone, NO for RV dysfunction  Keep intubated today   LOS: 10 days    Kathlee Nations Trigt III 10/30/2015

## 2015-10-30 NOTE — Anesthesia Procedure Notes (Signed)
Procedure Name: Intubation Date/Time: 10/30/2015 1:33 AM Performed by: Arlice Colt B Pre-anesthesia Checklist: Patient identified, Emergency Drugs available, Suction available, Patient being monitored and Timeout performed Patient Re-evaluated:Patient Re-evaluated prior to inductionOxygen Delivery Method: Circle system utilized Preoxygenation: Pre-oxygenation with 100% oxygen Intubation Type: IV induction and Rapid sequence Grade View: Grade I Tube type: Subglottic suction tube Tube size: 7.5 mm Number of attempts: 1 Airway Equipment and Method: Stylet Placement Confirmation: ETT inserted through vocal cords under direct vision,  positive ETCO2 and breath sounds checked- equal and bilateral Secured at: 22 cm Tube secured with: Tape Dental Injury: Teeth and Oropharynx as per pre-operative assessment

## 2015-10-30 NOTE — Progress Notes (Signed)
Initial Nutrition Assessment  DOCUMENTATION CODES:   Obesity unspecified  INTERVENTION:   If pt remains intubated recommend: Initiate Vital High Protein @ 25 ml/hr via OG tube and increase by 10 ml every 4 hours to goal rate of 45 ml/hr.   60 ml Prostat BID.    Tube feeding regimen provides 1480 kcal, 154 grams of protein, and 902 ml of H2O.    NUTRITION DIAGNOSIS:   Inadequate oral intake related to inability to eat as evidenced by NPO status.  GOAL:   Provide needs based on ASPEN/SCCM guidelines  MONITOR:   I & O's, Vent status  REASON FOR ASSESSMENT:   Ventilator    ASSESSMENT:   Pt with hx of HTN admitted 3/14 at [facility] with SOB. Pt with inferior STEMI and in complete heart block s/p heart cath on admission.   Pt had CABG x 4 3/22. Vib arrest, defibrillated x 4. Re-intubated and will remain on vent today.   Patient is currently intubated on ventilator support MV: 8.9 L/min Temp (24hrs), Avg:98.2 F (36.8 C), Min:96.2 F (35.7 C), Max:99 F (37.2 C)  Medications reviewed and include: colace, dulcolax, reglan Labs reviewed: sodium low 132 CBG: 80  Pt awake and alert. Per wife and sister pt eating normally until 1 and 1/2 weeks ago. During this time pt was eating poorly due to not feeling well. No noticed weight loss PTA.  Nutrition-Focused physical exam completed. Findings are no fat depletion, no muscle depletion, and moderate edema.    Diet Order:    NPO  Skin:  Reviewed, no issues (Neck, chest, and leg incisions)  Last BM:  3/20  Height:   Ht Readings from Last 1 Encounters:  10/20/15 5\' 10"  (1.778 m)    Weight:   Wt Readings from Last 1 Encounters:  10/30/15 255 lb 1.2 oz (115.7 kg)  Admission weight: 247 lb (112.1 kg)  Ideal Body Weight:  75.4 kg  BMI:  Body mass index is 36.6 kg/(m^2).  Estimated Nutritional Needs:   Kcal:  1749-4496  Protein:  >/= 150 grams  Fluid:  >1.5 L/day  EDUCATION NEEDS:   No education needs  identified at this time  Kendell Bane RD, LDN, CNSC [phone] Pager [phone] After Hours Pager

## 2015-10-30 NOTE — Progress Notes (Signed)
Patient ID: Christopher [name], male   DOB: 1960/11/28, 55 y.o.   MRN: [id number] T[facility] DAILY ICU PROGRESS NOTE                   [address]            [place] [address]          [phone]   3 Days Post-Op Procedure(s) (LRB): CORONARY ARTERY BYPASS GRAFTING (CABG) x four, using left internal mammary artery and rt leg greater saphenous vein harvested endoscopically (N/A) TRANSESOPHAGEAL ECHOCARDIOGRAM (TEE) (N/A) RIGHT ENDARTERECTOMY CAROTID with patch angioplasty using Xenosure bovine pericardium patch (Right)  Total Length of Stay:  LOS: 10 days   Subjective: V fib arrest, required defibrillation x 4 times. Patient neuro intact  Objective: Vital signs in last 24 hours: Temp:  [97.6 F (36.4 C)-99.1 F (37.3 C)] 98.4 F (36.9 C) (03/24 0000) Pulse Rate:  [35-107] 92 (03/23 2245) Cardiac Rhythm:  [-] Normal sinus rhythm (03/23 2100) Resp:  [13-30] 16 (03/23 2245) BP: (97-170)/(62-97) 107/75 mmHg (03/23 2200) SpO2:  [90 %-100 %] 96 % (03/23 2245) Arterial Line BP: (98-217)/(52-94) 144/65 mmHg (03/23 2245) Weight:  [254 lb 10.1 oz (115.5 kg)] 254 lb 10.1 oz (115.5 kg) (03/23 0550)  Filed Weights   10/27/15 0500 10/28/15 0515 10/29/15 0550  Weight: 247 lb 14.4 oz (112.447 kg) 250 lb 10.6 oz (113.7 kg) 254 lb 10.1 oz (115.5 kg)    Weight change:    Hemodynamic parameters for last 24 hours: PAP: (36-50)/(24-38) 43/30 mmHg CVP:  [16 mmHg-34 mmHg] 19 mmHg CO:  [5.3 L/min-5.7 L/min] 5.7 L/min CI:  [2.3 L/min/m2-2.5 L/min/m2] 2.5 L/min/m2  Intake/Output from previous day: 03/23 0701 - 03/24 0700 In: 1497 [P.O.:300; I.V.:917; Blood:280] Out: 2420 [Urine:2270; Chest Tube:150]  Intake/Output this shift: Total I/O In: 167.2 [P.O.:60; I.V.:107.2] Out: 760 [Urine:720; Chest Tube:40]  Current Meds: Scheduled Meds: . acetaminophen  1,000 mg Oral 4 times per day   Or  . acetaminophen (TYLENOL) oral liquid 160 mg/5 mL  1,000 mg Per Tube 4 times per day  .  amiodarone      . amiodarone      . antiseptic oral rinse  7 mL Mouth Rinse BID  . aspirin EC  325 mg Oral Daily   Or  . aspirin  324 mg Per Tube Daily  . atorvastatin  80 mg Oral q1800  . bisacodyl  10 mg Oral Daily   Or  . bisacodyl  10 mg Rectal Daily  . docusate sodium  200 mg Oral Daily  . enoxaparin (LOVENOX) injection  40 mg Subcutaneous Q24H  . furosemide  40 mg Intravenous BID  . insulin aspart  0-24 Units Subcutaneous 6 times per day  . insulin detemir  12 Units Subcutaneous BID  . levalbuterol  1.25 mg Nebulization TID  . metoCLOPramide (REGLAN) injection  10 mg Intravenous 4 times per day  . metolazone  5 mg Oral Daily  . mometasone-formoterol  2 puff Inhalation BID  . pantoprazole  40 mg Oral Daily  . sodium chloride flush  10-40 mL Intracatheter Q12H  . sodium chloride flush  3 mL Intravenous Q12H   Continuous Infusions: . sodium chloride    . sodium chloride    . sodium chloride 10 mL/hr at 10/29/15 2200  . dexmedetomidine    . DOPamine Stopped (10/29/15 1800)  . EPINEPHrine 4 mg in dextrose 5% 250 mL infusion (16 mcg/mL) 3 mcg/min (10/29/15 2200)  .  lactated ringers Stopped (10/29/15 1700)  . lactated ringers    . milrinone 0.3 mcg/kg/min (10/30/15 0024)  . norepinephrine (LEVOPHED) Adult infusion Stopped (10/28/15 1126)   PRN Meds:.albuterol, metoprolol, midazolam, morphine injection, ondansetron (ZOFRAN) IV, oxyCODONE, sodium chloride flush, sodium chloride flush, traMADol  General appearance: cooperative and mild distress Neurologic: intact Heart: now ddd paced,92 Lungs: diminished breath sounds bibasilar Abdomen: soft, non-tender; bowel sounds normal; no masses,  no organomegaly Extremities: extremities normal, atraumatic, no cyanosis or edema and Homans sign is negative, no sign of DVT Wound: sternum stable, chest compressions for  Lab Results: CBC: Recent Labs  10/28/15 1613  10/29/15 0313 10/29/15 1637 10/30/15 0124  WBC 15.1*  --   15.7*  --   --   HGB 9.6*  < > 10.1* 9.9* 10.2*  HCT 29.1*  < > 29.6* 29.0* 30.0*  PLT 152  --  142*  --   --   < > = values in this interval not displayed. BMET:  Recent Labs  10/28/15 0220  10/29/15 0313 10/29/15 1637 10/30/15 0124  NA 133*  < > 132* 130* 130*  K 4.5  < > 5.2* 4.3 4.3  CL 104  < > 100* 94* 93*  CO2 23  --  23  --   --   GLUCOSE 132*  < > 146* 123* 182*  BUN 14  < > 18 22* 27*  CREATININE 1.21  < > 1.32* 1.10 1.40*  CALCIUM 8.4*  --  8.4*  --   --   < > = values in this interval not displayed.  PT/INR:   Recent Labs  10/29/15 0313  LABPROT 21.0*  INR 1.82*   Radiology: Dg Chest Port 1 View  10/30/2015  CLINICAL DATA:  Shortness of breath.  Code blue. EXAM: PORTABLE CHEST 1 VIEW COMPARISON:  10/29/2015 FINDINGS: Postoperative changes in the mediastinum. Bilateral chest tubes are present. Right PICC line with tip over the cavoatrial junction. Left central venous catheter or sheath with tip over the left side of the neck, likely in the left internal jugular vein. Cardiac enlargement without significant vascular congestion. Shallow inspiration with elevation of the left hemidiaphragm. Probable left pleural effusion. No definite pulmonary consolidation. IMPRESSION: Shallow inspiration. Probable left pleural effusion. Cardiac enlargement. Appliances appear in satisfactory position. Electronically Signed   By: Burman Nieves M.D.   On: 10/30/2015 01:18   Dg Chest Port 1 View  10/29/2015  CLINICAL DATA:  Postop CABG 2 days ago. EXAM: PORTABLE CHEST 1 VIEW COMPARISON:  Portable chest x-ray of October 28, 2015 FINDINGS: The trachea and esophagus have been extubated. The cardiac silhouette remains enlarged. The pulmonary vascularity is mildly engorged. The bilateral chest tubes and the mediastinal drain are in stable position. There is no pneumothorax or large pleural effusion. The Swan-Ganz catheter tip overlies a proximal right lower lobe pulmonary artery branch. The  mediastinum is widened and accentuated by the hypo inflation. IMPRESSION: Bilateral hypoinflation following extubation of the trachea there is crowding of the pulmonary vascularity. Left basilar atelectasis or less likely pneumonia is more prominent today. Probable small left pleural effusion. The remaining support tubes and lines are in stable position. Electronically Signed   By: David  [place] M.D.   On: 10/29/2015 07:40     Assessment/Plan: S/P Procedure(s) (LRB): CORONARY ARTERY BYPASS GRAFTING (CABG) x four, using left internal mammary artery and rt leg greater saphenous vein harvested endoscopically (N/A) TRANSESOPHAGEAL ECHOCARDIOGRAM (TEE) (N/A) RIGHT ENDARTERECTOMY CAROTID with patch angioplasty using Livia Snellen  bovine pericardium patch (Right) Vib arrest , defibrillated x 4 , now ddd paced at 92, underlying 40-50 with episodes of a fib   Loaded  With amiodarone  300 mg and drip started  Labs pending  intubated to protect airway  and sedate to decrease  catacholamines  Stimulation  Left radial aline placed to gain  functioning hemodynamic monitoring  Follow up echocardiogram in am  CRITICAL CARE Performed by: Delight Ovens  Total critical care time: 75 minutes  Critical care time was exclusive of separately billable procedures and treating other patients.  Critical care was necessary to treat or prevent imminent or life-threatening deterioration.  Critical care was time spent personally by me on the following activities: development of treatment plan with patient and/or surrogate as well as nursing, discussions with consultants, evaluation of patient's response to treatment, examination of patient, obtaining history from patient or surrogate, ordering and performing treatments and interventions, ordering and review of laboratory studies, ordering and review of radiographic studies, pulse oximetry and re-evaluation of patient's condition.  Delight Ovens 10/30/2015 1:33 AM

## 2015-10-30 NOTE — Progress Notes (Signed)
Pt now sedated and stabilized, wife at bedside (was updated by Dr. Tyrone Sage over the phone).   Called lab again at 04:25 about 00:45 results from BMET and CBC that still weren't showing up. Lab said "give them five minutes" and they should show up.  Peyton Bottoms, RN 4:37 AM 10/30/2015

## 2015-10-30 NOTE — Progress Notes (Signed)
  Echocardiogram 2D Echocardiogram with Definity 65mL has been performed.  Leta Jungling M 10/30/2015, 9:30 AM

## 2015-10-30 NOTE — Care Management Note (Signed)
Case Management Note  Patient Details  Name: PACO FALKOWITZ MRN: [id number] Date of Birth: Feb 01, 1961  Subjective/Objective:  Pt is s/p CABG                  Action/Plan:  10/30/2015 Pt is now intubated.  CM will continue to follow for discharge needs  10/28/15  Pt is from home with wife; independent.  CM will continue to monitor for disposition needs   Expected Discharge Date:                  Expected Discharge Plan:  Home/Self Care  In-House Referral:     Discharge planning Services  CM Consult  Post Acute Care Choice:    Choice offered to:     DME Arranged:    DME Agency:     HH Arranged:    HH Agency:     Status of Service:  In process, will continue to follow  Medicare Important Message Given:    Date Medicare IM Given:    Medicare IM give by:    Date Additional Medicare IM Given:    Additional Medicare Important Message give by:     If discussed at Long Length of Stay Meetings, dates discussed:    Additional Comments:  Cherylann Parr, RN 10/30/2015, 11:14 AM

## 2015-10-31 ENCOUNTER — Inpatient Hospital Stay (HOSPITAL_COMMUNITY): Payer: BLUE CROSS/BLUE SHIELD

## 2015-10-31 LAB — GLUCOSE, CAPILLARY
GLUCOSE-CAPILLARY: 117 mg/dL — AB (ref 65–99)
GLUCOSE-CAPILLARY: 107 mg/dL — AB (ref 65–99)
Glucose-Capillary: 112 mg/dL — ABNORMAL HIGH (ref 65–99)
Glucose-Capillary: 131 mg/dL — ABNORMAL HIGH (ref 65–99)
Glucose-Capillary: 116 mg/dL — ABNORMAL HIGH (ref 65–99)
Glucose-Capillary: 130 mg/dL — ABNORMAL HIGH (ref 65–99)

## 2015-10-31 LAB — POCT I-STAT 3, ART BLOOD GAS (G3+)
Bicarbonate: 24.1 mEq/L — ABNORMAL HIGH (ref 20.0–24.0)
O2 SAT: 98 %
TCO2: 25 mmol/L (ref 0–100)
pCO2 arterial: 36.2 mmHg (ref 35.0–45.0)
pH, Arterial: 7.427 (ref 7.350–7.450)
pO2, Arterial: 92 mmHg (ref 80.0–100.0)

## 2015-10-31 LAB — COMPREHENSIVE METABOLIC PANEL
ALT: 96 U/L — ABNORMAL HIGH (ref 17–63)
AST: 132 U/L — ABNORMAL HIGH (ref 15–41)
Albumin: 2.7 g/dL — ABNORMAL LOW (ref 3.5–5.0)
Alkaline Phosphatase: 58 U/L (ref 38–126)
Anion gap: 12 (ref 5–15)
BUN: 44 mg/dL — ABNORMAL HIGH (ref 6–20)
CO2: 22 mmol/L (ref 22–32)
Calcium: 8 mg/dL — ABNORMAL LOW (ref 8.9–10.3)
Chloride: 98 mmol/L — ABNORMAL LOW (ref 101–111)
Creatinine, Ser: 1.91 mg/dL — ABNORMAL HIGH (ref 0.61–1.24)
GFR calc Af Amer: 44 mL/min — ABNORMAL LOW (ref >60)
GFR calc non Af Amer: 38 mL/min — ABNORMAL LOW (ref >60)
Glucose, Bld: 141 mg/dL — ABNORMAL HIGH (ref 65–99)
Potassium: 4.3 mmol/L (ref 3.5–5.1)
Sodium: 132 mmol/L — ABNORMAL LOW (ref 135–145)
Total Bilirubin: 1.3 mg/dL — ABNORMAL HIGH (ref 0.3–1.2)
Total Protein: 5.5 g/dL — ABNORMAL LOW (ref 6.5–8.1)

## 2015-10-31 LAB — POCT I-STAT, CHEM 8
BUN: 46 mg/dL — AB (ref 6–20)
CALCIUM ION: 1.03 mmol/L — AB (ref 1.12–1.23)
Chloride: 92 mmol/L — ABNORMAL LOW (ref 101–111)
Creatinine, Ser: 2.1 mg/dL — ABNORMAL HIGH (ref 0.61–1.24)
Glucose, Bld: 122 mg/dL — ABNORMAL HIGH (ref 65–99)
HCT: 27 % — ABNORMAL LOW (ref 39.0–52.0)
HEMOGLOBIN: 9.2 g/dL — AB (ref 13.0–17.0)
Potassium: 3.4 mmol/L — ABNORMAL LOW (ref 3.5–5.1)
SODIUM: 132 mmol/L — AB (ref 135–145)
TCO2: 26 mmol/L (ref 0–100)

## 2015-10-31 LAB — CBC
HCT: 25.4 % — ABNORMAL LOW (ref 39.0–52.0)
Hemoglobin: 8.8 g/dL — ABNORMAL LOW (ref 13.0–17.0)
MCH: 31.3 pg (ref 26.0–34.0)
MCHC: 34.6 g/dL (ref 30.0–36.0)
MCV: 90.4 fL (ref 78.0–100.0)
Platelets: 168 10*3/uL (ref 150–400)
RBC: 2.81 MIL/uL — ABNORMAL LOW (ref 4.22–5.81)
RDW: 15.2 % (ref 11.5–15.5)
WBC: 11.5 10*3/uL — ABNORMAL HIGH (ref 4.0–10.5)

## 2015-10-31 LAB — CARBOXYHEMOGLOBIN
Carboxyhemoglobin: 0.7 % (ref 0.5–1.5)
Carboxyhemoglobin: 0.9 % (ref 0.5–1.5)
METHEMOGLOBIN: 1 % (ref 0.0–1.5)
Methemoglobin: 1 % (ref 0.0–1.5)
O2 Saturation: 49.4 %
O2 Saturation: 56.7 %
Total hemoglobin: 7.2 g/dL — ABNORMAL LOW (ref 13.5–18.0)
Total hemoglobin: 7.2 g/dL — ABNORMAL LOW (ref 13.5–18.0)

## 2015-10-31 LAB — MAGNESIUM: Magnesium: 2.9 mg/dL — ABNORMAL HIGH (ref 1.7–2.4)

## 2015-10-31 MED ORDER — FUROSEMIDE 10 MG/ML IJ SOLN
80.0000 mg | Freq: Once | INTRAMUSCULAR | Status: AC
  Administered 2015-10-31: 80 mg via INTRAVENOUS
  Filled 2015-10-31: qty 8

## 2015-10-31 MED ORDER — POTASSIUM CHLORIDE 10 MEQ/50ML IV SOLN
10.0000 meq | INTRAVENOUS | Status: AC
  Administered 2015-10-31 (×4): 10 meq via INTRAVENOUS
  Filled 2015-10-31: qty 50

## 2015-10-31 MED ORDER — FUROSEMIDE 10 MG/ML IJ SOLN
10.0000 mg/h | INTRAVENOUS | Status: DC
  Administered 2015-10-31 – 2015-11-02 (×2): 10 mg/h via INTRAVENOUS
  Administered 2015-11-03 – 2015-11-05 (×3): 12 mg/h via INTRAVENOUS
  Filled 2015-10-31 (×12): qty 25

## 2015-10-31 MED ORDER — ASPIRIN 325 MG PO TABS
325.0000 mg | ORAL_TABLET | Freq: Every day | ORAL | Status: DC
  Administered 2015-10-31 – 2015-11-13 (×14): 325 mg
  Filled 2015-10-31 (×14): qty 1

## 2015-10-31 NOTE — Progress Notes (Signed)
EKG CRITICAL VALUE     12 lead EKG performed.  Critical value noted.  Judeth Cornfield, RN notified.   Barb Merino, CCT 10/31/2015 8:05 AM

## 2015-10-31 NOTE — Progress Notes (Signed)
4 Days Post-Op Procedure(s) (LRB): CORONARY ARTERY BYPASS GRAFTING (CABG) x four, using left internal mammary artery and rt leg greater saphenous vein harvested endoscopically (N/A) TRANSESOPHAGEAL ECHOCARDIOGRAM (TEE) (N/A) RIGHT ENDARTERECTOMY CAROTID with patch angioplasty using Xenosure bovine pericardium patch (Right) Subjective: Intubated on vent on fentanyl but wide awake  Objective: Vital signs in last 24 hours: Temp:  [96.9 F (36.1 C)-98 F (36.7 C)] 96.9 F (36.1 C) (03/25 1601) Pulse Rate:  [78-99] 79 (03/25 1615) Cardiac Rhythm:  [-] A-V Sequential paced (03/25 1615) Resp:  [9-24] 13 (03/25 1615) BP: (83-150)/(49-89) 96/75 mmHg (03/25 1600) SpO2:  [89 %-100 %] 98 % (03/25 1615) Arterial Line BP: (93-175)/(51-86) 111/61 mmHg (03/25 1615) FiO2 (%):  [50 %-80 %] 60 % (03/25 1615) Weight:  [118.4 kg (261 lb 0.4 oz)] 118.4 kg (261 lb 0.4 oz) (03/25 0500)  Hemodynamic parameters for last 24 hours: CVP:  [15 mmHg-22 mmHg] 22 mmHg  Intake/Output from previous day: 03/24 0701 - 03/25 0700 In: 2271.2 [I.V.:2091.2; NG/GT:130; IV Piggyback:50] Out: 2020 [Urine:1590; Chest Tube:430] Intake/Output this shift: Total I/O In: 972.6 [I.V.:912.6; NG/GT:60] Out: 1250 [Chest Tube:1250]  General appearance: alert and cooperative Neurologic: intact Heart: regular rate and rhythm, S1, S2 normal, no murmur, click, rub or gallop Lungs: diminished breath sounds bibasilar Abdomen: distended but soft, few bowel sounds. Extremities: edema moderate bilateral lower extremity Wound: incisions ok  Lab Results:  Recent Labs  10/30/15 0426 10/30/15 1555 10/31/15 0327  WBC 18.1*  --  11.5*  HGB 9.9* 8.5* 8.8*  HCT 30.5* 25.0* 25.4*  PLT 153  --  168   BMET:  Recent Labs  10/30/15 1800 10/31/15 0327  NA 131* 132*  K 4.1 4.3  CL 98* 98*  CO2 20* 22  GLUCOSE 142* 141*  BUN 36* 44*  CREATININE 1.75* 1.91*  CALCIUM 7.9* 8.0*    PT/INR:  Recent Labs  10/29/15 0313  LABPROT  21.0*  INR 1.82*   ABG    Component Value Date/Time   PHART 7.427 10/31/2015 0759   HCO3 24.1* 10/31/2015 0759   TCO2 25 10/31/2015 0759   ACIDBASEDEF 2.0 10/30/2015 1802   O2SAT 56.7 10/31/2015 1432   CBG (last 3)   Recent Labs  10/31/15 0351 10/31/15 0716 10/31/15 1117  GLUCAP 117* 131* 107*   CLINICAL DATA: 55 year old male with a history of endotracheal tube placement  Patient has undergone coronary artery bypass grafting x4 10/27/2015. Placement of intra aortic balloon pump at this date.  Ischemic cardiomyopathy.  EXAM: PORTABLE CHEST 1 VIEW  COMPARISON: 10/30/2015.  FINDINGS: Endotracheal tube terminates approximately 3.4 cm above the carina, unchanged.  Gastric tube terminates out of the field of view.  Defibrillator pads project over the left and right chest.  Bilateral thoracostomy tubes. Right upper extremity PICC. The mediastinal drain not visualized.  Intra-aortic balloon pump not visualized.  Surgical changes of median sternotomy and CABG.  Mixed bilateral interstitial and airspace opacities.  Low lung volumes.  IMPRESSION: Low lung volumes with mixed bilateral interstitial and airspace opacities, similar to the comparison chest x-ray. Retrocardiac opacity may reflect persistent pleural fluid, and/or atelectasis.  Surgical support apparatus appear unchanged, as above. Intra aortic balloon pump not visualized.  Signed,  Yvone Neu. Loreta Ave, DO  [facility]  [facility]   Electronically Signed  By: Gilmer Mor D.O.  On: 10/31/2015 07:25  Assessment/Plan: S/P Procedure(s) (LRB): CORONARY ARTERY BYPASS GRAFTING (CABG) x four, using left internal mammary artery and rt leg greater saphenous vein  harvested endoscopically (N/A) TRANSESOPHAGEAL ECHOCARDIOGRAM (TEE) (N/A) RIGHT ENDARTERECTOMY CAROTID with patch angioplasty using Xenosure bovine pericardium patch  (Right)  POD 4:  CV: Acute systolic heart failure following late presentation IMI with RV infarct and failure. His Co-ox was low at 49 this am and Milrinone increased to 0.4 per Dr. Shirlee Latch. Repeat Co-ox 57%.  Remains on levophed 3 and NO 10 ppm. CVP was 20 this am and he was started on lasix drip with good response. Repleat K+.  No further sustained arrhythmias on amio and lidocaine. Will continue for now.  Rhythm still looks like sinus with CHB. DDD pacing at 80.  Diabetes under good control  Will need attention to nutrition soon.     LOS: 11 days    Alleen Borne 10/31/2015

## 2015-10-31 NOTE — Progress Notes (Signed)
Patient ID: Christopher [name], male   DOB: 12-22-1960, 55 y.o.   MRN: [id number]   Christopher [name] is a 55 year old with a history of HTN, TIA, CHB, DMII, and marijuana use. On March 14th he presented to [facility] with increased dyspnea. On arrival he had inferior MI and CHB. Urgently transferred to [facility] for cath. LHC Ramus lesion, 70% stenosed, Prox LAD lesion, 95% stenosed, and prox RCA lesion, 100% stenosed. Developed pulmonary edema post cath and was diuresed IV lasix.   Cardiac Surgery consulted---10/27/15 S/P CABG x4 LIMA to LAD, SVG to DIAGONAL, SVG to RAMUS, INTERMEDIATE, SVG to RDA 3 and R CEA. Extubated 3/22. IABP removed 3/23 and remained on dopamine, milrinone, and epi. 3/23 had VF arrest requiring CPR shock x4. Re-intubated. CXR-infiltrate R lung base. Started on milrinone 0.2 mcg, amio drip, norepinephrine. Nitric oxide restarted at 10 ppm.  He had vfib arrest again 3/24 pm and lidocaine added.  This morning, stable BP.  No further VT/VF.  On amiodarone, lidocaine, norepi @ 3, milrinone @ 0.3.  Co-ox still low at 49%.  Some diuresis but net even and CVP 20.  Creatinine up from 1.75=>1.9. \  Awake on vent.   ECHO 10/02/15 EF 25-30% RV normal AK entire anteroseptal and inferoseptal myocardium.  TEE 10/27/2015: EF 35-40% RV mildly dilated TR mod regurg.  ECHO 10/30/15: EF 25-30%  Scheduled Meds: . acetaminophen  1,000 mg Oral 4 times per day   Or  . acetaminophen (TYLENOL) oral liquid 160 mg/5 mL  1,000 mg Per Tube 4 times per day  . amiodarone  150 mg Intravenous Once  . antiseptic oral rinse  7 mL Mouth Rinse QID  . aspirin EC  325 mg Oral Daily  . atorvastatin  80 mg Oral q1800  . bisacodyl  10 mg Oral Daily   Or  . bisacodyl  10 mg Rectal Daily  . chlorhexidine gluconate (SAGE KIT)  15 mL Mouth Rinse BID  . docusate sodium  200 mg Oral Daily  . enoxaparin (LOVENOX) injection  40 mg Subcutaneous Q24H  . furosemide  80 mg Intravenous Once  . insulin aspart  0-24 Units Subcutaneous 6  times per day  . insulin detemir  12 Units Subcutaneous BID  . levalbuterol  1.25 mg Nebulization TID  . metoCLOPramide (REGLAN) injection  10 mg Intravenous 4 times per day  . mometasone-formoterol  2 puff Inhalation BID  . pantoprazole (PROTONIX) IV  40 mg Intravenous Daily  . sodium chloride flush  10-40 mL Intracatheter Q12H  . sodium chloride flush  3 mL Intravenous Q12H   Continuous Infusions: . sodium chloride    . sodium chloride 10 mL/hr at 10/30/15 2000  . sodium chloride 10 mL/hr at 10/30/15 2000  . amiodarone 30 mg/hr (10/31/15 0335)  . dexmedetomidine 0.7 mcg/kg/hr (10/31/15 0101)  . DOPamine Stopped (10/29/15 1800)  . fentaNYL infusion INTRAVENOUS 75 mcg/hr (10/30/15 2000)  . furosemide (LASIX) infusion    . lactated ringers Stopped (10/29/15 1700)  . lactated ringers    . lidocaine 1 mg/min (10/30/15 2000)  . milrinone 0.3 mcg/kg/min (10/31/15 0313)  . norepinephrine (LEVOPHED) Adult infusion 3 mcg/min (10/30/15 2000)   PRN Meds:.albuterol, metoprolol, midazolam, morphine injection, ondansetron (ZOFRAN) IV, oxyCODONE, sodium chloride flush, sodium chloride flush, traMADol    Filed Vitals:   10/31/15 0630 10/31/15 0645 10/31/15 0700 10/31/15 0721  BP: 96/69  96/62   Pulse: 80 79 79   Temp:    97.1 F (36.2 C)  TempSrc:    Axillary  Resp: 16 18 19    Height:      Weight:      SpO2: 100% 100% 100%     Intake/Output Summary (Last 24 hours) at 10/31/15 0832 Last data filed at 10/31/15 0600  Gross per 24 hour  Intake 2160.01 ml  Output   1875 ml  Net 285.01 ml    LABS: Basic Metabolic Panel:  Recent Labs  10/30/15 0420  10/30/15 1800 10/31/15 0327  NA 132*  < > 131* 132*  K 4.0  < > 4.1 4.3  CL 99*  < > 98* 98*  CO2 24  --  20* 22  GLUCOSE 130*  < > 142* 141*  BUN 25*  < > 36* 44*  CREATININE 1.46*  < > 1.75* 1.91*  CALCIUM 7.9*  --  7.9* 8.0*  MG 2.1  --   --  2.9*  < > = values in this interval not displayed. Liver Function Tests:  Recent  Labs  10/30/15 0045 10/31/15 0327  AST 47* 132*  ALT 41 96*  ALKPHOS 59 58  BILITOT 1.9* 1.3*  PROT 6.0* 5.5*  ALBUMIN 3.1* 2.7*   No results for input(s): LIPASE, AMYLASE in the last 72 hours. CBC:  Recent Labs  10/30/15 0426 10/30/15 1555 10/31/15 0327  WBC 18.1*  --  11.5*  HGB 9.9* 8.5* 8.8*  HCT 30.5* 25.0* 25.4*  MCV 92.4  --  90.4  PLT 153  --  168   Cardiac Enzymes:  Recent Labs  10/30/15 0045  TROPONINI 2.01*   BNP: Invalid input(s): POCBNP D-Dimer: No results for input(s): DDIMER in the last 72 hours. Hemoglobin A1C: No results for input(s): HGBA1C in the last 72 hours. Fasting Lipid Panel: No results for input(s): CHOL, HDL, LDLCALC, TRIG, CHOLHDL, LDLDIRECT in the last 72 hours. Thyroid Function Tests:  Recent Labs  10/30/15 0045  TSH 7.432*   Anemia Panel: No results for input(s): VITAMINB12, FOLATE, FERRITIN, TIBC, IRON, RETICCTPCT in the last 72 hours.  RADIOLOGY: Dg Chest 2 View  10/23/2015  CLINICAL DATA:  CHF.  Weakness. EXAM: CHEST  2 VIEW COMPARISON:  10/21/2015 FINDINGS: Multiple interstitial densities in the lower chest are suggestive for interstitial edema, right side greater than left. Heart size is upper limits of normal. Trachea is midline. Negative for a pneumothorax. Evidence for a small right pleural effusion. IMPRESSION: Interstitial densities in the lower chest, right side greater than left. Findings are suggestive for interstitial pulmonary edema. Difficult to exclude some airspace disease in the right lower lung. Evidence for small right pleural effusion. Electronically Signed   By: Markus Daft M.D.   On: 10/23/2015 08:27   Dg Chest Port 1 View  10/31/2015  CLINICAL DATA:  55 year old male with a history of endotracheal tube placement Patient has undergone coronary artery bypass grafting x4 10/27/2015. Placement of intra aortic balloon pump at this date. Ischemic cardiomyopathy. EXAM: PORTABLE CHEST 1 VIEW COMPARISON:  10/30/2015.  FINDINGS: Endotracheal tube terminates approximately 3.4 cm above the carina, unchanged. Gastric tube terminates out of the field of view. Defibrillator pads project over the left and right chest. Bilateral thoracostomy tubes. Right upper extremity PICC. The mediastinal drain not visualized. Intra-aortic balloon pump not visualized. Surgical changes of median sternotomy and CABG. Mixed bilateral interstitial and airspace opacities. Low lung volumes. IMPRESSION: Low lung volumes with mixed bilateral interstitial and airspace opacities, similar to the comparison chest x-ray. Retrocardiac opacity may reflect persistent pleural fluid, and/or atelectasis.  Surgical support apparatus appear unchanged, as above. Intra aortic balloon pump not visualized. Signed, Dulcy Fanny. Earleen Newport, DO [facility] Radiology Electronically Signed   By: Corrie Mckusick D.O.   On: 10/31/2015 07:25   Dg Chest Port 1 View  10/30/2015  CLINICAL DATA:  Post cavity.  Recent CABG. EXAM: PORTABLE CHEST 1 VIEW COMPARISON:  Portable film earlier today. FINDINGS: Overlying support apparatus redemonstrated. Tubes and lines remain stable. Elevated LEFT hemidiaphragm. No definite pneumothorax. Moderate vascular congestion may be increased. Cardiomegaly. IMPRESSION: Slight worsening aeration. Moderate vascular congestion may be increased. Electronically Signed   By: Staci Righter M.D.   On: 10/30/2015 18:39   Dg Chest Port 1 View  10/30/2015  CLINICAL DATA:  Status post coronary bypass grafting EXAM: PORTABLE CHEST 1 VIEW COMPARISON:  10/30/2015 FINDINGS: Cardiac shadow remains enlarged. Bilateral chest tubes are again seen. A right-sided PICC line and endotracheal tube are noted in satisfactory position. Left jugular sheath is noted. Elevation of left hemidiaphragm is noted with left basilar atelectasis. No pneumothorax is noted. IMPRESSION: Postsurgical change with tubes and lines as described. Mild left  basilar atelectasis. Electronically Signed   By: Inez Catalina M.D.   On: 10/30/2015 07:45   Dg Chest Port 1 View  10/30/2015  CLINICAL DATA:  A chest tube in place EXAM: PORTABLE CHEST 1 VIEW COMPARISON:  10/30/2015 FINDINGS: Postoperative changes in the mediastinum. Endotracheal tube with tip measuring 6.3 cm about the carina. Right PICC catheter with tip over the cavoatrial junction. Left central venous catheter sheath with tip over the left neck consistent location in the internal jugular vein. Bilateral chest tubes. Infiltration or atelectasis in the right lung base. Cardiac enlargement. No significant vascular congestion. No pneumothorax. IMPRESSION: Appliance positioned as described. Cardiac enlargement with infiltration or atelectasis in the right lung base. Bilateral chest tubes are present but no visualized pneumothorax. Electronically Signed   By: Lucienne Capers M.D.   On: 10/30/2015 03:07   Dg Chest Port 1 View  10/30/2015  CLINICAL DATA:  Shortness of breath.  Code blue. EXAM: PORTABLE CHEST 1 VIEW COMPARISON:  10/29/2015 FINDINGS: Postoperative changes in the mediastinum. Bilateral chest tubes are present. Right PICC line with tip over the cavoatrial junction. Left central venous catheter or sheath with tip over the left side of the neck, likely in the left internal jugular vein. Cardiac enlargement without significant vascular congestion. Shallow inspiration with elevation of the left hemidiaphragm. Probable left pleural effusion. No definite pulmonary consolidation. IMPRESSION: Shallow inspiration. Probable left pleural effusion. Cardiac enlargement. Appliances appear in satisfactory position. Electronically Signed   By: Lucienne Capers M.D.   On: 10/30/2015 01:18   Dg Chest Port 1 View  10/29/2015  CLINICAL DATA:  Postop CABG 2 days ago. EXAM: PORTABLE CHEST 1 VIEW COMPARISON:  Portable chest x-ray of October 28, 2015 FINDINGS: The trachea and esophagus have been extubated. The cardiac  silhouette remains enlarged. The pulmonary vascularity is mildly engorged. The bilateral chest tubes and the mediastinal drain are in stable position. There is no pneumothorax or large pleural effusion. The Swan-Ganz catheter tip overlies a proximal right lower lobe pulmonary artery branch. The mediastinum is widened and accentuated by the hypo inflation. IMPRESSION: Bilateral hypoinflation following extubation of the trachea there is crowding of the pulmonary vascularity. Left basilar atelectasis or less likely pneumonia is more prominent today. Probable small left pleural effusion. The remaining support tubes and lines are in stable position. Electronically Signed   By: Shanon Brow  [place] M.D.   On: 10/29/2015 07:40   Dg Chest Port 1 View  10/28/2015  CLINICAL DATA:  Post CABG, on ventilator, followup portable chest x-ray of 10/28/2015 EXAM: PORTABLE CHEST 1 VIEW COMPARISON:  None. FINDINGS: The tip of the endotracheal tube is approximately 5.2 cm above the carina. Aeration has improved slightly. Atelectasis remains at the lung bases left-greater-than-right. Swan-Ganz catheter tip is in the right lower lobe pulmonary artery and bilateral chest tubes remain. No pneumothorax is seen. Heart size is stable. IMPRESSION: 1. Improved aeration. 2. Bilateral chest tubes.  No pneumothorax. 3. Tip of endotracheal tube approximately 5.2 cm above the carina. Electronically Signed   By: Ivar Drape M.D.   On: 10/28/2015 08:01   Dg Chest Port 1 View  10/27/2015  CLINICAL DATA:  Status post CABG EXAM: PORTABLE CHEST 1 VIEW COMPARISON:  10/27/2015 FINDINGS: Unchanged tracheostomy tube. Swan-Ganz central venous line is now seen with tip advanced into the right perihilar region. Stable right chest tube with and left chest tube. NG tube again crosses the gastroesophageal junction. Stable mediastinal drain. Enlarged cardiac silhouette. Limited inspiratory effect with bibasilar opacities likely representing atelectasis. IMPRESSION:  Anticipated postoperative appearance with bibasilar atelectasis. Electronically Signed   By: Skipper Cliche M.D.   On: 10/27/2015 19:25   Dg Chest Portable 1 View  10/27/2015  CLINICAL DATA:  Status post coronary bypass grafting EXAM: PORTABLE CHEST 1 VIEW COMPARISON:  10/23/2015 FINDINGS: Cardiac shadow is mildly enlarged. A Swan-Ganz catheter is noted in the right pulmonary outflow tract. Bilateral thoracostomy catheters are seen. No pneumothorax is noted. An endotracheal tube is noted in satisfactory position. Intra-aortic balloon pump is noted over the aortic arch. Mild left basilar atelectasis is seen. Mild central vascular congestion is noted. IMPRESSION: Postoperative changes with tubes and lines as described above. Mild vascular congestion and left basilar atelectasis. Electronically Signed   By: Inez Catalina M.D.   On: 10/27/2015 17:23   Portable Chest X-ray 1 View  10/21/2015  CLINICAL DATA:  55 year old male with a history of acute systolic congestive heart failure EXAM: PORTABLE CHEST 1 VIEW COMPARISON:  10/20/2015 FINDINGS: Heart size unchanged, enlarged. Similar appearance of low lung volumes with interstitial opacities, interlobular septal thickening, and developing airspace disease of the bilateral lower lungs. No pneumothorax. Linear opacity overlying the mediastinum and the heart border, of on certain significance. Favored to overlie the patient. IMPRESSION: Evidence of worsening congestive heart failure, with increasing edema. New linear opacity overlies the mediastinum, favored to be overlying the patient, though cannot be localized on this view. Signed, Dulcy Fanny. Earleen Newport, DO [facility] Radiology Electronically Signed   By: Corrie Mckusick D.O.   On: 10/21/2015 07:16   Dg Chest Portable 1 View  10/20/2015  CLINICAL DATA:  Shortness of breath. EXAM: PORTABLE CHEST 1 VIEW COMPARISON:  None. FINDINGS: Midline trachea. Cardiomegaly accentuated  by AP portable technique. No pleural effusion or pneumothorax. Mildly low lung volumes with bibasilar atelectasis. IMPRESSION: Mild cardiomegaly and low lung volumes.  No acute findings. Electronically Signed   By: Abigail Miyamoto M.D.   On: 10/20/2015 18:09    PHYSICAL EXAM General: Awake on vent Neck: JVP 12 cm, no thyromegaly or thyroid nodule.  Lungs: Decreased breath sounds at bases bilaterally.  CV: Nondisplaced PMI.  Heart regular S1/S2, no S3/S4, no murmur.  1+ edema to knee on right (vein harvest leg).   Abdomen: Soft, nontender, no hepatosplenomegaly, no distention.  Neurologic: Alert and follows commands.  Psych:  Normal affect. Extremities: No clubbing or cyanosis.   TELEMETRY: Reviewed telemetry, v-pacing.  VF last night as well as NSVT runs.  Now stable.   ASSESSMENT AND PLAN:  1. CAD: S/p late presentation inferior MI and CABG x 4. Continue statin, ASA.  2. Cardiogenic shock: Acute systolic CHF with RV failure post inferior MI with suspected RV involvement. EF 25-30% on echo (difficult images on 3/24 echo).  - Co-ox 49% this morning => increase milrinone to 0.4 and will repeat co-ox this afternoon.  - Continue norepinephrine at 3 for now.  - He is back on NO at 10 ppm after decompensation in setting of RV failure.  - CVP 20, marked volume overload. Will give Lasix 80 mg IV x 1 then start gtt at 10 mg/hr.  Possible that diuresis and decrease in RV size/overload will help overall hemodynamics.  3. Complete heart block:  Underlying CHB. He is pacing at 80 bpm. If he needs permanent pacer, will need BiV device.  4. VF arrest 3/24 early am and again at night 3/24: Remains on amiodarone gtt and lidocaine gtt added.  Will keep on both for now, will aim to stop lidocaine when hemodynamics looking better.  5. ID: No definite evidence for infection.  Afebrile, CXR without definite PNA.   35 minutes critical care time.   Loralie Champagne 10/31/2015 8:39 AM

## 2015-11-01 ENCOUNTER — Inpatient Hospital Stay (HOSPITAL_COMMUNITY): Payer: BLUE CROSS/BLUE SHIELD

## 2015-11-01 DIAGNOSIS — R570 Cardiogenic shock: Secondary | ICD-10-CM

## 2015-11-01 LAB — CARBOXYHEMOGLOBIN
CARBOXYHEMOGLOBIN: 1 % (ref 0.5–1.5)
Carboxyhemoglobin: 1.1 % (ref 0.5–1.5)
METHEMOGLOBIN: 1.1 % (ref 0.0–1.5)
Methemoglobin: 1 % (ref 0.0–1.5)
O2 Saturation: 42.3 %
O2 Saturation: 56.4 %
TOTAL HEMOGLOBIN: 8 g/dL — AB (ref 13.5–18.0)
Total hemoglobin: 13.4 g/dL — ABNORMAL LOW (ref 13.5–18.0)

## 2015-11-01 LAB — CBC
HCT: 27 % — ABNORMAL LOW (ref 39.0–52.0)
Hemoglobin: 8.7 g/dL — ABNORMAL LOW (ref 13.0–17.0)
MCH: 29.2 pg (ref 26.0–34.0)
MCHC: 32.2 g/dL (ref 30.0–36.0)
MCV: 90.6 fL (ref 78.0–100.0)
Platelets: 213 10*3/uL (ref 150–400)
RBC: 2.98 MIL/uL — ABNORMAL LOW (ref 4.22–5.81)
RDW: 15.3 % (ref 11.5–15.5)
WBC: 11.4 10*3/uL — ABNORMAL HIGH (ref 4.0–10.5)

## 2015-11-01 LAB — BASIC METABOLIC PANEL
Anion gap: 14 (ref 5–15)
BUN: 51 mg/dL — AB (ref 6–20)
CO2: 27 mmol/L (ref 22–32)
Calcium: 8.3 mg/dL — ABNORMAL LOW (ref 8.9–10.3)
Chloride: 92 mmol/L — ABNORMAL LOW (ref 101–111)
Creatinine, Ser: 2.03 mg/dL — ABNORMAL HIGH (ref 0.61–1.24)
GFR calc Af Amer: 41 mL/min — ABNORMAL LOW (ref >60)
GFR, EST NON AFRICAN AMERICAN: 35 mL/min — AB (ref >60)
GLUCOSE: 116 mg/dL — AB (ref 65–99)
POTASSIUM: 3.6 mmol/L (ref 3.5–5.1)
Sodium: 133 mmol/L — ABNORMAL LOW (ref 135–145)

## 2015-11-01 LAB — COMPREHENSIVE METABOLIC PANEL
ALT: 148 U/L — ABNORMAL HIGH (ref 17–63)
AST: 160 U/L — ABNORMAL HIGH (ref 15–41)
Albumin: 2.7 g/dL — ABNORMAL LOW (ref 3.5–5.0)
Alkaline Phosphatase: 67 U/L (ref 38–126)
Anion gap: 15 (ref 5–15)
BUN: 49 mg/dL — ABNORMAL HIGH (ref 6–20)
CO2: 25 mmol/L (ref 22–32)
Calcium: 8.1 mg/dL — ABNORMAL LOW (ref 8.9–10.3)
Chloride: 93 mmol/L — ABNORMAL LOW (ref 101–111)
Creatinine, Ser: 2.02 mg/dL — ABNORMAL HIGH (ref 0.61–1.24)
GFR calc Af Amer: 41 mL/min — ABNORMAL LOW (ref >60)
GFR calc non Af Amer: 36 mL/min — ABNORMAL LOW (ref >60)
Glucose, Bld: 120 mg/dL — ABNORMAL HIGH (ref 65–99)
Potassium: 3.3 mmol/L — ABNORMAL LOW (ref 3.5–5.1)
Sodium: 133 mmol/L — ABNORMAL LOW (ref 135–145)
Total Bilirubin: 1.3 mg/dL — ABNORMAL HIGH (ref 0.3–1.2)
Total Protein: 6 g/dL — ABNORMAL LOW (ref 6.5–8.1)

## 2015-11-01 LAB — GLUCOSE, CAPILLARY
GLUCOSE-CAPILLARY: 95 mg/dL (ref 65–99)
GLUCOSE-CAPILLARY: 108 mg/dL — AB (ref 65–99)
Glucose-Capillary: 108 mg/dL — ABNORMAL HIGH (ref 65–99)
Glucose-Capillary: 95 mg/dL (ref 65–99)
Glucose-Capillary: 123 mg/dL — ABNORMAL HIGH (ref 65–99)
Glucose-Capillary: 89 mg/dL (ref 65–99)

## 2015-11-01 LAB — URINE CULTURE: CULTURE: NO GROWTH

## 2015-11-01 LAB — POCT I-STAT 3, ART BLOOD GAS (G3+)
Acid-Base Excess: 3 mmol/L — ABNORMAL HIGH (ref 0.0–2.0)
Bicarbonate: 27.1 mEq/L — ABNORMAL HIGH (ref 20.0–24.0)
O2 SAT: 95 %
PCO2 ART: 38.7 mmHg (ref 35.0–45.0)
PH ART: 7.45 (ref 7.350–7.450)
TCO2: 28 mmol/L (ref 0–100)
pO2, Arterial: 70 mmHg — ABNORMAL LOW (ref 80.0–100.0)

## 2015-11-01 LAB — MAGNESIUM: Magnesium: 2.7 mg/dL — ABNORMAL HIGH (ref 1.7–2.4)

## 2015-11-01 MED ORDER — POTASSIUM CHLORIDE 10 MEQ/50ML IV SOLN
INTRAVENOUS | Status: AC
  Filled 2015-11-01: qty 150

## 2015-11-01 MED ORDER — METOLAZONE 5 MG PO TABS
2.5000 mg | ORAL_TABLET | Freq: Once | ORAL | Status: AC
  Administered 2015-11-01: 2.5 mg via ORAL
  Filled 2015-11-01: qty 1

## 2015-11-01 MED ORDER — POTASSIUM CHLORIDE 10 MEQ/50ML IV SOLN
10.0000 meq | INTRAVENOUS | Status: AC
  Administered 2015-11-01 (×3): 10 meq via INTRAVENOUS
  Filled 2015-11-01: qty 50

## 2015-11-01 MED ORDER — POTASSIUM CHLORIDE 10 MEQ/50ML IV SOLN
10.0000 meq | INTRAVENOUS | Status: AC
  Administered 2015-11-01 (×3): 10 meq via INTRAVENOUS

## 2015-11-01 MED ORDER — POTASSIUM CHLORIDE 20 MEQ/15ML (10%) PO SOLN
40.0000 meq | Freq: Two times a day (BID) | ORAL | Status: DC
  Administered 2015-11-01 – 2015-11-02 (×4): 40 meq via ORAL
  Filled 2015-11-01 (×5): qty 30

## 2015-11-01 MED ORDER — POTASSIUM CHLORIDE 20 MEQ PO PACK
40.0000 meq | PACK | Freq: Two times a day (BID) | ORAL | Status: DC
  Filled 2015-11-01: qty 2

## 2015-11-01 NOTE — Progress Notes (Signed)
5 Days Post-Op Procedure(s) (LRB): CORONARY ARTERY BYPASS GRAFTING (CABG) x four, using left internal mammary artery and rt leg greater saphenous vein harvested endoscopically (N/A) TRANSESOPHAGEAL ECHOCARDIOGRAM (TEE) (N/A) RIGHT ENDARTERECTOMY CAROTID with patch angioplasty using Xenosure bovine pericardium patch (Right) Subjective:  No complaints. Remains on vent, sedated but still wide awake and following commands, calm  Objective: Vital signs in last 24 hours: Temp:  [96.5 F (35.8 C)-97.7 F (36.5 C)] 96.8 F (36 C) (03/26 0718) Pulse Rate:  [78-124] 86 (03/26 0900) Cardiac Rhythm:  [-] A-V Sequential paced (03/26 0900) Resp:  [10-22] 10 (03/26 0900) BP: (83-110)/(49-75) 97/65 mmHg (03/26 0200) SpO2:  [93 %-100 %] 97 % (03/26 0900) Arterial Line BP: (97-136)/(53-72) 136/72 mmHg (03/26 0900) FiO2 (%):  [40 %-70 %] 40 % (03/26 0900) Weight:  [114.443 kg (252 lb 4.8 oz)] 114.443 kg (252 lb 4.8 oz) (03/26 0600)  Hemodynamic parameters for last 24 hours: CVP:  [18 mmHg-24 mmHg] 18 mmHg  Intake/Output from previous day: 03/25 0701 - 03/26 0700 In: 2664.1 [I.V.:2354.1; NG/GT:60; IV Piggyback:250] Out: 4490 [Urine:3870; Emesis/NG output:300; Chest Tube:320] Intake/Output this shift: Total I/O In: 442.2 [I.V.:362.2; NG/GT:30; IV Piggyback:50] Out: 590 [Urine:550; Chest Tube:40]  Neurologic: intact Heart: regular rate and rhythm, S1, S2 normal, no murmur, click, rub or gallop Lungs: clear to auscultation bilaterally Abdomen: soft, mildly distended, few tinkling bowel sounds. Extremities: edema mild, improving Wound: incisions ok  Lab Results:  Recent Labs  10/31/15 0327 10/31/15 1628 11/01/15 0400  WBC 11.5*  --  11.4*  HGB 8.8* 9.2* 8.7*  HCT 25.4* 27.0* 27.0*  PLT 168  --  213   BMET:  Recent Labs  10/31/15 0327 10/31/15 1628 11/01/15 0400  NA 132* 132* 133*  K 4.3 3.4* 3.3*  CL 98* 92* 93*  CO2 22  --  25  GLUCOSE 141* 122* 120*  BUN 44* 46* 49*   CREATININE 1.91* 2.10* 2.02*  CALCIUM 8.0*  --  8.1*    PT/INR: No results for input(s): LABPROT, INR in the last 72 hours. ABG    Component Value Date/Time   PHART 7.450 11/01/2015 0445   HCO3 27.1* 11/01/2015 0445   TCO2 28 11/01/2015 0445   ACIDBASEDEF 2.0 10/30/2015 1802   O2SAT 56.4 11/01/2015 0825   CBG (last 3)   Recent Labs  10/31/15 2351 11/01/15 0443 11/01/15 0710  GLUCAP 95 108* 123*   CLINICAL DATA: Post CABG  EXAM: PORTABLE CHEST 1 VIEW  COMPARISON: 10/31/2015  FINDINGS: Cardiomediastinal silhouette is stable. Status post CABG. Stable endotracheal and NG tube position. Left chest tube is unchanged in position. Persistent central vascular congestion and mild interstitial prominence bilaterally suspicious for the mild interstitial edema. Left IJ sheath in place. Probable small left pleural effusion left basilar atelectasis or infiltrate. Right arm PICC line is unchanged in position. There is no pneumothorax.  IMPRESSION: Status post CABG. Stable support apparatus. Stable left chest tube position. No pneumothorax. Again noted bilateral mild interstitial prominence and perihilar opacities suspicious for mild pulmonary edema. Probable small left pleural effusion left basilar atelectasis or infiltrate.   Electronically Signed  By: Natasha Mead M.D.  On: 11/01/2015 09:57  Assessment/Plan: S/P Procedure(s) (LRB): CORONARY ARTERY BYPASS GRAFTING (CABG) x four, using left internal mammary artery and rt leg greater saphenous vein harvested endoscopically (N/A) TRANSESOPHAGEAL ECHOCARDIOGRAM (TEE) (N/A) RIGHT ENDARTERECTOMY CAROTID with patch angioplasty using Xenosure bovine pericardium patch (Right)  He is hemodynamically stable and weaning levophed this am. Co-ox 56% now on Milrinone 0.4, levophed  3. Remains DDD paced at 86. Brief few beats of NSVT overnight. ECG this am shows atypical atrial flutter with CHB. Continue lidocaine and amio for now.    Volume excess improving with lasix drip. -1800 cc yesterday and looks less edematous. CVP still elevated at 18 but was in the 20-23 range yesterday.  BUN/creat up slightly with diuresis as expected. repleat K+   Will hold off on tube feeds today since just starting to have some bowel sounds and hopefully can get extubated soon.   LOS: 12 days    Alleen Borne 11/01/2015

## 2015-11-01 NOTE — Progress Notes (Signed)
Patient ID: JORGEN WOLFINGER, male   DOB: Jul 09, 1961, 55 y.o.   MRN: [id number]   Mr Abe is a 55 year old with a history of HTN, TIA, CHB, DMII, and marijuana use. On March 14th he presented to [facility] with increased dyspnea. On arrival he had inferior MI and CHB. Urgently transferred to [facility] for cath. LHC Ramus lesion, 70% stenosed, Prox LAD lesion, 95% stenosed, and prox RCA lesion, 100% stenosed. Developed pulmonary edema post cath and was diuresed IV lasix.   Cardiac Surgery consulted---10/27/15 S/P CABG x4 LIMA to LAD, SVG to DIAGONAL, SVG to RAMUS, INTERMEDIATE, SVG to RDA 3 and R CEA. Extubated 3/22. IABP removed 3/23 and remained on dopamine, milrinone, and epi. 3/23 had VF arrest requiring CPR shock x4. Re-intubated. CXR-infiltrate R lung base. Started on milrinone 0.2 mcg, amio drip, norepinephrine. Nitric oxide restarted at 10 ppm.  He had vfib arrest again 3/24 pm and lidocaine added.  This morning, stable BP.  No further VT/VF.  On amiodarone, lidocaine, norepi @ 3, milrinone @ 0.4.  Co-ox up to 56% yesterday pm but this am 42%.  He diuresed well yesterday with fall in weight, creatinine down slightly.  BP stable.  CVP remains 20+.   ECG done with PPM off => atypical atrial flutter with rate about 160 and slow escape rhythm consistent with ongoing complete heart block.  Awake on vent.   ECHO 10/02/15 EF 25-30% RV normal AK entire anteroseptal and inferoseptal myocardium.  TEE 10/27/2015: EF 35-40% RV mildly dilated TR mod regurg.  ECHO 10/30/15: EF 25-30%  Scheduled Meds: . acetaminophen  1,000 mg Oral 4 times per day   Or  . acetaminophen (TYLENOL) oral liquid 160 mg/5 mL  1,000 mg Per Tube 4 times per day  . amiodarone  150 mg Intravenous Once  . antiseptic oral rinse  7 mL Mouth Rinse QID  . aspirin  325 mg Per Tube Daily  . atorvastatin  80 mg Oral q1800  . bisacodyl  10 mg Oral Daily   Or  . bisacodyl  10 mg Rectal Daily  . chlorhexidine gluconate (SAGE KIT)  15 mL  Mouth Rinse BID  . docusate sodium  200 mg Oral Daily  . enoxaparin (LOVENOX) injection  40 mg Subcutaneous Q24H  . insulin aspart  0-24 Units Subcutaneous 6 times per day  . insulin detemir  12 Units Subcutaneous BID  . levalbuterol  1.25 mg Nebulization TID  . metoCLOPramide (REGLAN) injection  10 mg Intravenous 4 times per day  . metolazone  2.5 mg Oral Once  . mometasone-formoterol  2 puff Inhalation BID  . pantoprazole (PROTONIX) IV  40 mg Intravenous Daily  . potassium chloride  40 mEq Per Tube BID  . potassium chloride  10 mEq Intravenous Q1 Hr x 3  . sodium chloride flush  10-40 mL Intracatheter Q12H  . sodium chloride flush  3 mL Intravenous Q12H   Continuous Infusions: . sodium chloride    . sodium chloride 10 mL/hr at 10/30/15 2000  . sodium chloride 10 mL/hr at 10/30/15 2000  . amiodarone 30 mg/hr (11/01/15 0300)  . dexmedetomidine 0.7 mcg/kg/hr (11/01/15 0645)  . DOPamine Stopped (10/29/15 1800)  . fentaNYL infusion INTRAVENOUS 75 mcg/hr (10/31/15 2141)  . furosemide (LASIX) infusion 10 mg/hr (10/31/15 1900)  . lactated ringers Stopped (10/29/15 1700)  . lactated ringers    . lidocaine 1 mg/min (11/01/15 0200)  . milrinone 0.4 mcg/kg/min (11/01/15 0500)  . norepinephrine (LEVOPHED) Adult infusion 3 mcg/min (  10/31/15 1900)   PRN Meds:.albuterol, metoprolol, midazolam, morphine injection, ondansetron (ZOFRAN) IV, oxyCODONE, sodium chloride flush, sodium chloride flush, traMADol    Filed Vitals:   11/01/15 0500 11/01/15 0600 11/01/15 0700 11/01/15 0718  BP:      Pulse: 85 86 86   Temp:    96.8 F (36 C)  TempSrc:    Axillary  Resp: _0 Height:      Weight:  252 lb 4.8 oz (114.443 kg)    SpO2: 93% 95% 95%     Intake/Output Summary (Last 24 hours) at 11/01/15 0816 Last data filed at 11/01/15 0700  Gross per 24 hour  Intake 2541.47 ml  Output   4470 ml  Net -1928.53 ml    LABS: Basic Metabolic Panel:  Recent Labs  10/31/15 0327 10/31/15 1628  11/01/15 0400  NA 132* 132* 133*  K 4.3 3.4* 3.3*  CL 98* 92* 93*  CO2 22  --  25  GLUCOSE 141* 122* 120*  BUN 44* 46* 49*  CREATININE 1.91* 2.10* 2.02*  CALCIUM 8.0*  --  8.1*  MG 2.9*  --  2.7*   Liver Function Tests:  Recent Labs  10/31/15 0327 11/01/15 0400  AST 132* 160*  ALT 96* 148*  ALKPHOS 58 67  BILITOT 1.3* 1.3*  PROT 5.5* 6.0*  ALBUMIN 2.7* 2.7*   No results for input(s): LIPASE, AMYLASE in the last 72 hours. CBC:  Recent Labs  10/31/15 0327 10/31/15 1628 11/01/15 0400  WBC 11.5*  --  11.4*  HGB 8.8* 9.2* 8.7*  HCT 25.4* 27.0* 27.0*  MCV 90.4  --  90.6  PLT 168  --  213   Cardiac Enzymes:  Recent Labs  10/30/15 0045  TROPONINI 2.01*   BNP: Invalid input(s): POCBNP D-Dimer: No results for input(s): DDIMER in the last 72 hours. Hemoglobin A1C: No results for input(s): HGBA1C in the last 72 hours. Fasting Lipid Panel: No results for input(s): CHOL, HDL, LDLCALC, TRIG, CHOLHDL, LDLDIRECT in the last 72 hours. Thyroid Function Tests:  Recent Labs  10/30/15 0045  TSH 7.432*   Anemia Panel: No results for input(s): VITAMINB12, FOLATE, FERRITIN, TIBC, IRON, RETICCTPCT in the last 72 hours.  RADIOLOGY: Dg Chest 2 View  10/23/2015  CLINICAL DATA:  CHF.  Weakness. EXAM: CHEST  2 VIEW COMPARISON:  10/21/2015 FINDINGS: Multiple interstitial densities in the lower chest are suggestive for interstitial edema, right side greater than left. Heart size is upper limits of normal. Trachea is midline. Negative for a pneumothorax. Evidence for a small right pleural effusion. IMPRESSION: Interstitial densities in the lower chest, right side greater than left. Findings are suggestive for interstitial pulmonary edema. Difficult to exclude some airspace disease in the right lower lung. Evidence for small right pleural effusion. Electronically Signed   By: Markus Daft M.D.   On: 10/23/2015 08:27   Dg Chest Port 1 View  10/31/2015  CLINICAL DATA:  55 year old male  with a history of endotracheal tube placement Patient has undergone coronary artery bypass grafting x4 10/27/2015. Placement of intra aortic balloon pump at this date. Ischemic cardiomyopathy. EXAM: PORTABLE CHEST 1 VIEW COMPARISON:  10/30/2015. FINDINGS: Endotracheal tube terminates approximately 3.4 cm above the carina, unchanged. Gastric tube terminates out of the field of view. Defibrillator pads project over the left and right chest. Bilateral thoracostomy tubes. Right upper extremity PICC. The mediastinal drain not visualized. Intra-aortic balloon pump not visualized. Surgical changes of median sternotomy and CABG. Mixed bilateral interstitial and  airspace opacities. Low lung volumes. IMPRESSION: Low lung volumes with mixed bilateral interstitial and airspace opacities, similar to the comparison chest x-ray. Retrocardiac opacity may reflect persistent pleural fluid, and/or atelectasis. Surgical support apparatus appear unchanged, as above. Intra aortic balloon pump not visualized. Signed, Dulcy Fanny. Earleen Newport, DO [facility] Electronically Signed   By: Corrie Mckusick D.O.   On: 10/31/2015 07:25   Dg Chest Port 1 View  10/30/2015  CLINICAL DATA:  Post cavity.  Recent CABG. EXAM: PORTABLE CHEST 1 VIEW COMPARISON:  Portable film earlier today. FINDINGS: Overlying support apparatus redemonstrated. Tubes and lines remain stable. Elevated LEFT hemidiaphragm. No definite pneumothorax. Moderate vascular congestion may be increased. Cardiomegaly. IMPRESSION: Slight worsening aeration. Moderate vascular congestion may be increased. Electronically Signed   By: Staci Righter M.D.   On: 10/30/2015 18:39   Dg Chest Port 1 View  10/30/2015  CLINICAL DATA:  Status post coronary bypass grafting EXAM: PORTABLE CHEST 1 VIEW COMPARISON:  10/30/2015 FINDINGS: Cardiac shadow remains enlarged. Bilateral chest tubes are again seen. A right-sided PICC line and endotracheal  tube are noted in satisfactory position. Left jugular sheath is noted. Elevation of left hemidiaphragm is noted with left basilar atelectasis. No pneumothorax is noted. IMPRESSION: Postsurgical change with tubes and lines as described. Mild left basilar atelectasis. Electronically Signed   By: Inez Catalina M.D.   On: 10/30/2015 07:45   Dg Chest Port 1 View  10/30/2015  CLINICAL DATA:  A chest tube in place EXAM: PORTABLE CHEST 1 VIEW COMPARISON:  10/30/2015 FINDINGS: Postoperative changes in the mediastinum. Endotracheal tube with tip measuring 6.3 cm about the carina. Right PICC catheter with tip over the cavoatrial junction. Left central venous catheter sheath with tip over the left neck consistent location in the internal jugular vein. Bilateral chest tubes. Infiltration or atelectasis in the right lung base. Cardiac enlargement. No significant vascular congestion. No pneumothorax. IMPRESSION: Appliance positioned as described. Cardiac enlargement with infiltration or atelectasis in the right lung base. Bilateral chest tubes are present but no visualized pneumothorax. Electronically Signed   By: Lucienne Capers M.D.   On: 10/30/2015 03:07   Dg Chest Port 1 View  10/30/2015  CLINICAL DATA:  Shortness of breath.  Code blue. EXAM: PORTABLE CHEST 1 VIEW COMPARISON:  10/29/2015 FINDINGS: Postoperative changes in the mediastinum. Bilateral chest tubes are present. Right PICC line with tip over the cavoatrial junction. Left central venous catheter or sheath with tip over the left side of the neck, likely in the left internal jugular vein. Cardiac enlargement without significant vascular congestion. Shallow inspiration with elevation of the left hemidiaphragm. Probable left pleural effusion. No definite pulmonary consolidation. IMPRESSION: Shallow inspiration. Probable left pleural effusion. Cardiac enlargement. Appliances appear in satisfactory position. Electronically Signed   By: Lucienne Capers M.D.   On:  10/30/2015 01:18   Dg Chest Port 1 View  10/29/2015  CLINICAL DATA:  Postop CABG 2 days ago. EXAM: PORTABLE CHEST 1 VIEW COMPARISON:  Portable chest x-ray of October 28, 2015 FINDINGS: The trachea and esophagus have been extubated. The cardiac silhouette remains enlarged. The pulmonary vascularity is mildly engorged. The bilateral chest tubes and the mediastinal drain are in stable position. There is no pneumothorax or large pleural effusion. The Swan-Ganz catheter tip overlies a proximal right lower lobe pulmonary artery branch. The mediastinum is widened and accentuated by the hypo inflation. IMPRESSION: Bilateral hypoinflation following extubation of the trachea there is crowding of the pulmonary vascularity. Left basilar  atelectasis or less likely pneumonia is more prominent today. Probable small left pleural effusion. The remaining support tubes and lines are in stable position. Electronically Signed   By: David  Martinique M.D.   On: 10/29/2015 07:40   Dg Chest Port 1 View  10/28/2015  CLINICAL DATA:  Post CABG, on ventilator, followup portable chest x-ray of 10/28/2015 EXAM: PORTABLE CHEST 1 VIEW COMPARISON:  None. FINDINGS: The tip of the endotracheal tube is approximately 5.2 cm above the carina. Aeration has improved slightly. Atelectasis remains at the lung bases left-greater-than-right. Swan-Ganz catheter tip is in the right lower lobe pulmonary artery and bilateral chest tubes remain. No pneumothorax is seen. Heart size is stable. IMPRESSION: 1. Improved aeration. 2. Bilateral chest tubes.  No pneumothorax. 3. Tip of endotracheal tube approximately 5.2 cm above the carina. Electronically Signed   By: Ivar Drape M.D.   On: 10/28/2015 08:01   Dg Chest Port 1 View  10/27/2015  CLINICAL DATA:  Status post CABG EXAM: PORTABLE CHEST 1 VIEW COMPARISON:  10/27/2015 FINDINGS: Unchanged tracheostomy tube. Swan-Ganz central venous line is now seen with tip advanced into the right perihilar region. Stable right  chest tube with and left chest tube. NG tube again crosses the gastroesophageal junction. Stable mediastinal drain. Enlarged cardiac silhouette. Limited inspiratory effect with bibasilar opacities likely representing atelectasis. IMPRESSION: Anticipated postoperative appearance with bibasilar atelectasis. Electronically Signed   By: Skipper Cliche M.D.   On: 10/27/2015 19:25   Dg Chest Portable 1 View  10/27/2015  CLINICAL DATA:  Status post coronary bypass grafting EXAM: PORTABLE CHEST 1 VIEW COMPARISON:  10/23/2015 FINDINGS: Cardiac shadow is mildly enlarged. A Swan-Ganz catheter is noted in the right pulmonary outflow tract. Bilateral thoracostomy catheters are seen. No pneumothorax is noted. An endotracheal tube is noted in satisfactory position. Intra-aortic balloon pump is noted over the aortic arch. Mild left basilar atelectasis is seen. Mild central vascular congestion is noted. IMPRESSION: Postoperative changes with tubes and lines as described above. Mild vascular congestion and left basilar atelectasis. Electronically Signed   By: Inez Catalina M.D.   On: 10/27/2015 17:23   Portable Chest X-ray 1 View  10/21/2015  CLINICAL DATA:  55 year old male with a history of acute systolic congestive heart failure EXAM: PORTABLE CHEST 1 VIEW COMPARISON:  10/20/2015 FINDINGS: Heart size unchanged, enlarged. Similar appearance of low lung volumes with interstitial opacities, interlobular septal thickening, and developing airspace disease of the bilateral lower lungs. No pneumothorax. Linear opacity overlying the mediastinum and the heart border, of on certain significance. Favored to overlie the patient. IMPRESSION: Evidence of worsening congestive heart failure, with increasing edema. New linear opacity overlies the mediastinum, favored to be overlying the patient, though cannot be localized on this view. Signed, Dulcy Fanny. Earleen Newport, DO [facility]  Electronically Signed   By: Corrie Mckusick D.O.   On: 10/21/2015 07:16   Dg Chest Portable 1 View  10/20/2015  CLINICAL DATA:  Shortness of breath. EXAM: PORTABLE CHEST 1 VIEW COMPARISON:  None. FINDINGS: Midline trachea. Cardiomegaly accentuated by AP portable technique. No pleural effusion or pneumothorax. Mildly low lung volumes with bibasilar atelectasis. IMPRESSION: Mild cardiomegaly and low lung volumes.  No acute findings. Electronically Signed   By: Abigail Miyamoto M.D.   On: 10/20/2015 18:09    PHYSICAL EXAM General: Awake on vent Neck: JVP 12+ cm, no thyromegaly or thyroid nodule.  Lungs: Decreased breath sounds at bases bilaterally.  CV: Nondisplaced PMI.  Heart regular S1/S2,  no S3/S4, no murmur.  2+ edema to knee on right (vein harvest leg), 1+ on left.  Abdomen: Soft, nontender, no hepatosplenomegaly, no distention.  Neurologic: Alert and follows commands.  Psych: Normal affect. Extremities: No clubbing or cyanosis.   TELEMETRY: Reviewed telemetry, v-pacing.    ASSESSMENT AND PLAN:  1. CAD: S/p late presentation inferior MI and CABG x 4. Continue statin, ASA.  2. Cardiogenic shock: Acute systolic CHF with RV failure post inferior MI with suspected RV involvement. EF 25-30% on echo (difficult images on 3/24 echo).  - Co-ox 42% this morning but looked better last night.  BP stable/better.  Repeat co-ox now, if < 55%, will increase milrinone to 0.5. - Continue norepinephrine at 3 for now.  - He is back on NO at 10 ppm after decompensation in setting of RV failure.  - CVP 20+, ongoing marked volume overload. He diuresed well yesterday on Lasix gtt. I hope that diuresis and decrease in RV size/overload will help overall hemodynamics.  Continue Lasix gtt and add metolazone 2.5 x 1 today.  Replace K aggressively.  3. Complete heart block:  Underlying CHB still.   Atrial rhythm actually appears to be an atypical atrial flutter.  He is pacing at 86 bpm. Suspect he will need  permanent pacing, would use BiV device.   4. VF arrest 3/24 early am and again at night 3/24: Remains on amiodarone gtt and lidocaine gtt added.  Will keep on both for now, will aim to stop lidocaine when hemodynamics looking better.  Given VF > 48 hrs post revascularization, likely should get defibrillator (BiV-ICD) when stabilized.  5. ID: No definite evidence for infection.  Afebrile, CXR without definite PNA.   35 minutes critical care time.   Loralie Champagne 11/01/2015 8:16 AM

## 2015-11-02 ENCOUNTER — Inpatient Hospital Stay (HOSPITAL_COMMUNITY): Payer: BLUE CROSS/BLUE SHIELD

## 2015-11-02 ENCOUNTER — Encounter (HOSPITAL_COMMUNITY)
Admission: EM | Disposition: A | Discharge status: Rehabilitation Facility | Payer: Self-pay | Source: Other Acute Inpatient Hospital | Attending: Cardiothoracic Surgery

## 2015-11-02 DIAGNOSIS — I472 Ventricular tachycardia: Secondary | ICD-10-CM

## 2015-11-02 DIAGNOSIS — R570 Cardiogenic shock: Secondary | ICD-10-CM

## 2015-11-02 HISTORY — PX: CARDIAC CATHETERIZATION: SHX172

## 2015-11-02 LAB — BASIC METABOLIC PANEL
ANION GAP: 13 (ref 5–15)
BUN: 48 mg/dL — ABNORMAL HIGH (ref 6–20)
CALCIUM: 8.2 mg/dL — AB (ref 8.9–10.3)
CO2: 30 mmol/L (ref 22–32)
Chloride: 91 mmol/L — ABNORMAL LOW (ref 101–111)
Creatinine, Ser: 1.85 mg/dL — ABNORMAL HIGH (ref 0.61–1.24)
GFR calc Af Amer: 46 mL/min — ABNORMAL LOW (ref >60)
GFR, EST NON AFRICAN AMERICAN: 40 mL/min — AB (ref >60)
Glucose, Bld: 127 mg/dL — ABNORMAL HIGH (ref 65–99)
POTASSIUM: 3.5 mmol/L (ref 3.5–5.1)
SODIUM: 134 mmol/L — AB (ref 135–145)

## 2015-11-02 LAB — POCT I-STAT, CHEM 8
BUN: 47 mg/dL — ABNORMAL HIGH (ref 6–20)
BUN: 45 mg/dL — AB (ref 6–20)
BUN: 45 mg/dL — ABNORMAL HIGH (ref 6–20)
BUN: 45 mg/dL — AB (ref 6–20)
CHLORIDE: 87 mmol/L — AB (ref 101–111)
CHLORIDE: 86 mmol/L — AB (ref 101–111)
CHLORIDE: 88 mmol/L — AB (ref 101–111)
CREATININE: 2.1 mg/dL — AB (ref 0.61–1.24)
CREATININE: 1.9 mg/dL — AB (ref 0.61–1.24)
Calcium, Ion: 1.03 mmol/L — ABNORMAL LOW (ref 1.12–1.23)
Calcium, Ion: 1.02 mmol/L — ABNORMAL LOW (ref 1.12–1.23)
Calcium, Ion: 1.03 mmol/L — ABNORMAL LOW (ref 1.12–1.23)
Calcium, Ion: 1.08 mmol/L — ABNORMAL LOW (ref 1.12–1.23)
Chloride: 88 mmol/L — ABNORMAL LOW (ref 101–111)
Creatinine, Ser: 1.9 mg/dL — ABNORMAL HIGH (ref 0.61–1.24)
Creatinine, Ser: 2 mg/dL — ABNORMAL HIGH (ref 0.61–1.24)
GLUCOSE: 121 mg/dL — AB (ref 65–99)
GLUCOSE: 128 mg/dL — AB (ref 65–99)
GLUCOSE: 129 mg/dL — AB (ref 65–99)
Glucose, Bld: 117 mg/dL — ABNORMAL HIGH (ref 65–99)
HCT: 29 % — ABNORMAL LOW (ref 39.0–52.0)
HCT: 29 % — ABNORMAL LOW (ref 39.0–52.0)
HEMATOCRIT: 28 % — AB (ref 39.0–52.0)
HEMATOCRIT: 30 % — AB (ref 39.0–52.0)
HEMOGLOBIN: 9.5 g/dL — AB (ref 13.0–17.0)
HEMOGLOBIN: 10.2 g/dL — AB (ref 13.0–17.0)
HEMOGLOBIN: 9.9 g/dL — AB (ref 13.0–17.0)
Hemoglobin: 9.9 g/dL — ABNORMAL LOW (ref 13.0–17.0)
POTASSIUM: 3.4 mmol/L — AB (ref 3.5–5.1)
POTASSIUM: 3.6 mmol/L (ref 3.5–5.1)
POTASSIUM: 3.4 mmol/L — AB (ref 3.5–5.1)
POTASSIUM: 3.4 mmol/L — AB (ref 3.5–5.1)
Sodium: 133 mmol/L — ABNORMAL LOW (ref 135–145)
Sodium: 133 mmol/L — ABNORMAL LOW (ref 135–145)
Sodium: 133 mmol/L — ABNORMAL LOW (ref 135–145)
Sodium: 131 mmol/L — ABNORMAL LOW (ref 135–145)
TCO2: 34 mmol/L (ref 0–100)
TCO2: 33 mmol/L (ref 0–100)
TCO2: 31 mmol/L (ref 0–100)
TCO2: 32 mmol/L (ref 0–100)

## 2015-11-02 LAB — COMPREHENSIVE METABOLIC PANEL
ALT: 156 U/L — ABNORMAL HIGH (ref 17–63)
AST: 144 U/L — ABNORMAL HIGH (ref 15–41)
Albumin: 2.6 g/dL — ABNORMAL LOW (ref 3.5–5.0)
Alkaline Phosphatase: 70 U/L (ref 38–126)
Anion gap: 14 (ref 5–15)
BUN: 51 mg/dL — ABNORMAL HIGH (ref 6–20)
CO2: 28 mmol/L (ref 22–32)
Calcium: 8.2 mg/dL — ABNORMAL LOW (ref 8.9–10.3)
Chloride: 90 mmol/L — ABNORMAL LOW (ref 101–111)
Creatinine, Ser: 1.85 mg/dL — ABNORMAL HIGH (ref 0.61–1.24)
GFR calc Af Amer: 46 mL/min — ABNORMAL LOW (ref >60)
GFR calc non Af Amer: 40 mL/min — ABNORMAL LOW (ref >60)
Glucose, Bld: 120 mg/dL — ABNORMAL HIGH (ref 65–99)
Potassium: 3.3 mmol/L — ABNORMAL LOW (ref 3.5–5.1)
Sodium: 132 mmol/L — ABNORMAL LOW (ref 135–145)
Total Bilirubin: 1.6 mg/dL — ABNORMAL HIGH (ref 0.3–1.2)
Total Protein: 6 g/dL — ABNORMAL LOW (ref 6.5–8.1)

## 2015-11-02 LAB — CARBOXYHEMOGLOBIN
Carboxyhemoglobin: 1 % (ref 0.5–1.5)
Carboxyhemoglobin: 1.1 % (ref 0.5–1.5)
Carboxyhemoglobin: 1.4 % (ref 0.5–1.5)
Carboxyhemoglobin: 1.3 % (ref 0.5–1.5)
METHEMOGLOBIN: 1.3 % (ref 0.0–1.5)
Methemoglobin: 1.1 % (ref 0.0–1.5)
Methemoglobin: 1.1 % (ref 0.0–1.5)
Methemoglobin: 1.2 % (ref 0.0–1.5)
O2 Saturation: 42.6 %
O2 Saturation: 54.8 %
O2 Saturation: 57.9 %
O2 Saturation: 57.7 %
TOTAL HEMOGLOBIN: 8.3 g/dL — AB (ref 13.5–18.0)
Total hemoglobin: 8.9 g/dL — ABNORMAL LOW (ref 13.5–18.0)
Total hemoglobin: 8.5 g/dL — ABNORMAL LOW (ref 13.5–18.0)
Total hemoglobin: 9.1 g/dL — ABNORMAL LOW (ref 13.5–18.0)

## 2015-11-02 LAB — CBC
HCT: 26.8 % — ABNORMAL LOW (ref 39.0–52.0)
Hemoglobin: 8.7 g/dL — ABNORMAL LOW (ref 13.0–17.0)
MCH: 29.2 pg (ref 26.0–34.0)
MCHC: 32.5 g/dL (ref 30.0–36.0)
MCV: 89.9 fL (ref 78.0–100.0)
Platelets: 203 10*3/uL (ref 150–400)
RBC: 2.98 MIL/uL — ABNORMAL LOW (ref 4.22–5.81)
RDW: 15.3 % (ref 11.5–15.5)
WBC: 10 10*3/uL (ref 4.0–10.5)

## 2015-11-02 LAB — POCT I-STAT 3, ART BLOOD GAS (G3+)
ACID-BASE EXCESS: 9 mmol/L — AB (ref 0.0–2.0)
ACID-BASE EXCESS: 9 mmol/L — AB (ref 0.0–2.0)
Acid-Base Excess: 8 mmol/L — ABNORMAL HIGH (ref 0.0–2.0)
BICARBONATE: 32.7 meq/L — AB (ref 20.0–24.0)
BICARBONATE: 33.6 meq/L — AB (ref 20.0–24.0)
BICARBONATE: 33.2 meq/L — AB (ref 20.0–24.0)
O2 Saturation: 97 %
O2 Saturation: 97 %
O2 Saturation: 100 %
PCO2 ART: 44.5 mmHg (ref 35.0–45.0)
PCO2 ART: 47.2 mmHg — AB (ref 35.0–45.0)
PO2 ART: 84 mmHg (ref 80.0–100.0)
PO2 ART: 220 mmHg — AB (ref 80.0–100.0)
Patient temperature: 98.5
TCO2: 34 mmol/L (ref 0–100)
TCO2: 35 mmol/L (ref 0–100)
TCO2: 35 mmol/L (ref 0–100)
pCO2 arterial: 45.3 mmHg — ABNORMAL HIGH (ref 35.0–45.0)
pH, Arterial: 7.475 — ABNORMAL HIGH (ref 7.350–7.450)
pH, Arterial: 7.461 — ABNORMAL HIGH (ref 7.350–7.450)
pH, Arterial: 7.474 — ABNORMAL HIGH (ref 7.350–7.450)
pO2, Arterial: 86 mmHg (ref 80.0–100.0)

## 2015-11-02 LAB — POCT I-STAT 3, VENOUS BLOOD GAS (G3P V)
Acid-Base Excess: 9 mmol/L — ABNORMAL HIGH (ref 0.0–2.0)
BICARBONATE: 34.3 meq/L — AB (ref 20.0–24.0)
O2 SAT: 64 %
PCO2 VEN: 52.8 mmHg — AB (ref 45.0–50.0)
TCO2: 36 mmol/L (ref 0–100)
pH, Ven: 7.422 — ABNORMAL HIGH (ref 7.250–7.300)
pO2, Ven: 33 mmHg (ref 31.0–45.0)

## 2015-11-02 LAB — POTASSIUM: Potassium: 3.6 mmol/L (ref 3.5–5.1)

## 2015-11-02 LAB — GLUCOSE, CAPILLARY
GLUCOSE-CAPILLARY: 114 mg/dL — AB (ref 65–99)
GLUCOSE-CAPILLARY: 143 mg/dL — AB (ref 65–99)
Glucose-Capillary: 124 mg/dL — ABNORMAL HIGH (ref 65–99)
Glucose-Capillary: 124 mg/dL — ABNORMAL HIGH (ref 65–99)

## 2015-11-02 LAB — TROPONIN I
TROPONIN I: 1.15 ng/mL — AB (ref <0.031)
Troponin I: 1.12 ng/mL (ref <0.031)
Troponin I: 1.12 ng/mL (ref <0.031)

## 2015-11-02 LAB — MAGNESIUM
MAGNESIUM: 2.6 mg/dL — AB (ref 1.7–2.4)
Magnesium: 2.5 mg/dL — ABNORMAL HIGH (ref 1.7–2.4)

## 2015-11-02 SURGERY — RIGHT/LEFT HEART CATH AND CORONARY/GRAFT ANGIOGRAPHY

## 2015-11-02 MED ORDER — DEXTROSE 5 % IV SOLN
1000.0000 mg | Freq: Once | INTRAVENOUS | Status: AC
  Administered 2015-11-02: 1000 mg via INTRAVENOUS
  Filled 2015-11-02: qty 10

## 2015-11-02 MED ORDER — MIDAZOLAM HCL 2 MG/2ML IJ SOLN: 1.0000 mg | Freq: Once | INTRAMUSCULAR | Status: DC

## 2015-11-02 MED ORDER — LIDOCAINE HCL (PF) 1 % IJ SOLN
INTRAMUSCULAR | Status: DC | PRN
  Administered 2015-11-02: 30 mL

## 2015-11-02 MED ORDER — SODIUM CHLORIDE 0.9% FLUSH: 3.0000 mL | Freq: Two times a day (BID) | INTRAVENOUS | Status: DC

## 2015-11-02 MED ORDER — SODIUM CHLORIDE 0.9% FLUSH: 3.0000 mL | INTRAVENOUS | Status: DC | PRN

## 2015-11-02 MED ORDER — IOPAMIDOL (ISOVUE-370) INJECTION 76%
INTRAVENOUS | Status: DC | PRN
  Administered 2015-11-02: 25 mL via INTRA_ARTERIAL

## 2015-11-02 MED ORDER — SODIUM CHLORIDE 0.9 % IV SOLN: 250.0000 mL | INTRAVENOUS | Status: DC | PRN

## 2015-11-02 MED ORDER — POTASSIUM CHLORIDE 10 MEQ/50ML IV SOLN
10.0000 meq | INTRAVENOUS | Status: AC
  Administered 2015-11-02 (×4): 10 meq via INTRAVENOUS
  Filled 2015-11-02 (×2): qty 50

## 2015-11-02 MED ORDER — METOLAZONE 5 MG PO TABS
2.5000 mg | ORAL_TABLET | Freq: Once | ORAL | Status: AC
  Administered 2015-11-02: 2.5 mg via ORAL
  Filled 2015-11-02: qty 1

## 2015-11-02 MED ORDER — ONDANSETRON HCL 4 MG/2ML IJ SOLN: 4.0000 mg | Freq: Four times a day (QID) | INTRAMUSCULAR | Status: DC | PRN

## 2015-11-02 MED ORDER — IOPAMIDOL (ISOVUE-370) INJECTION 76%
INTRAVENOUS | Status: AC
  Filled 2015-11-02: qty 100

## 2015-11-02 MED ORDER — POTASSIUM CHLORIDE 10 MEQ/50ML IV SOLN
10.0000 meq | INTRAVENOUS | Status: AC
  Administered 2015-11-02 (×3): 10 meq via INTRAVENOUS
  Filled 2015-11-02 (×3): qty 50

## 2015-11-02 MED ORDER — SODIUM CHLORIDE 0.9 % IV SOLN: INTRAVENOUS | Status: DC

## 2015-11-02 MED ORDER — PROCAINAMIDE HCL 100 MG/ML IJ SOLN
2.0000 mg/min | INTRAVENOUS | Status: DC
  Administered 2015-11-02 – 2015-11-03 (×3): 2 mg/min via INTRAVENOUS
  Filled 2015-11-02 (×3): qty 10

## 2015-11-02 MED ORDER — AMIODARONE HCL 150 MG/3ML IV SOLN
INTRAVENOUS | Status: AC
  Filled 2015-11-02: qty 3

## 2015-11-02 MED ORDER — MIDAZOLAM HCL 2 MG/2ML IJ SOLN
INTRAMUSCULAR | Status: DC | PRN
  Administered 2015-11-02: 2 mg via INTRAVENOUS
  Administered 2015-11-02: 1 mg via INTRAVENOUS

## 2015-11-02 MED ORDER — POTASSIUM CHLORIDE 10 MEQ/50ML IV SOLN
10.0000 meq | INTRAVENOUS | Status: AC
  Administered 2015-11-02 (×4): 10 meq via INTRAVENOUS
  Filled 2015-11-02: qty 50

## 2015-11-02 MED ORDER — ACETAMINOPHEN 325 MG PO TABS: 650.0000 mg | ORAL_TABLET | ORAL | Status: DC | PRN

## 2015-11-02 MED ORDER — SODIUM CHLORIDE 0.9 % IV SOLN
1.0000 mg/h | INTRAVENOUS | Status: DC
  Administered 2015-11-02: 1 mg/h via INTRAVENOUS
  Administered 2015-11-02 – 2015-11-03 (×2): 3 mg/h via INTRAVENOUS
  Administered 2015-11-04: 4 mg/h via INTRAVENOUS
  Administered 2015-11-04: 3 mg/h via INTRAVENOUS
  Administered 2015-11-05 – 2015-11-07 (×6): 4 mg/h via INTRAVENOUS
  Administered 2015-11-08: 5 mg/h via INTRAVENOUS
  Administered 2015-11-08: 2 mg/h via INTRAVENOUS
  Administered 2015-11-08: 5 mg/h via INTRAVENOUS
  Administered 2015-11-09: 6 mg/h via INTRAVENOUS
  Administered 2015-11-09 – 2015-11-10 (×2): 5 mg/h via INTRAVENOUS
  Administered 2015-11-10: 6 mg/h via INTRAVENOUS
  Administered 2015-11-11 (×2): 5 mg/h via INTRAVENOUS
  Administered 2015-11-11: 6 mg/h via INTRAVENOUS
  Administered 2015-11-12: 2 mg/h via INTRAVENOUS
  Administered 2015-11-13: 5 mg/h via INTRAVENOUS
  Filled 2015-11-02 (×24): qty 10

## 2015-11-02 MED ORDER — FENTANYL CITRATE (PF) 100 MCG/2ML IJ SOLN
INTRAMUSCULAR | Status: DC | PRN
  Administered 2015-11-02: 25 ug via INTRAVENOUS

## 2015-11-02 MED ORDER — AMIODARONE IV BOLUS ONLY 150 MG/100ML: 150.0000 mg | Freq: Once | INTRAVENOUS | Status: DC

## 2015-11-02 MED ORDER — DEXTROSE 5 % IV SOLN
1.0000 g | Freq: Three times a day (TID) | INTRAVENOUS | Status: DC
  Administered 2015-11-02 – 2015-11-04 (×8): 1 g via INTRAVENOUS
  Filled 2015-11-02 (×9): qty 1

## 2015-11-02 MED ORDER — VITAL 1.5 CAL PO LIQD
1000.0000 mL | ORAL | Status: DC
  Administered 2015-11-02: 1000 mL
  Filled 2015-11-02 (×3): qty 1000

## 2015-11-02 MED ORDER — HEPARIN (PORCINE) IN NACL 100-0.45 UNIT/ML-% IJ SOLN
1200.0000 [IU]/h | INTRAMUSCULAR | Status: DC
  Administered 2015-11-02: 1100 [IU]/h via INTRAVENOUS
  Administered 2015-11-03 – 2015-11-07 (×5): 1200 [IU]/h via INTRAVENOUS
  Filled 2015-11-02 (×7): qty 250

## 2015-11-02 MED ORDER — AMIODARONE LOAD VIA INFUSION
150.0000 mg | Freq: Once | INTRAVENOUS | Status: AC
  Administered 2015-11-02: 150 mg via INTRAVENOUS
  Filled 2015-11-02: qty 83.34

## 2015-11-02 MED ORDER — SODIUM CHLORIDE 0.9 % IV SOLN
1.0000 mg/h | INTRAVENOUS | Status: DC
  Filled 2015-11-02: qty 10

## 2015-11-02 MED ORDER — FENTANYL CITRATE (PF) 100 MCG/2ML IJ SOLN: 25.0000 ug | Freq: Once | INTRAMUSCULAR | Status: DC

## 2015-11-02 MED ORDER — LIDOCAINE HCL (PF) 1 % IJ SOLN
INTRAMUSCULAR | Status: AC
  Filled 2015-11-02: qty 30

## 2015-11-02 MED ORDER — POTASSIUM CHLORIDE 10 MEQ/50ML IV SOLN
10.0000 meq | INTRAVENOUS | Status: AC
  Administered 2015-11-02: 10 meq via INTRAVENOUS
  Filled 2015-11-02: qty 50

## 2015-11-02 MED ORDER — VITAL 1.5 CAL PO LIQD
1000.0000 mL | ORAL | Status: DC
  Filled 2015-11-02 (×2): qty 1000

## 2015-11-02 MED ORDER — HEPARIN (PORCINE) IN NACL 2-0.9 UNIT/ML-% IJ SOLN
INTRAMUSCULAR | Status: AC
  Filled 2015-11-02: qty 1500

## 2015-11-02 MED ORDER — HEPARIN (PORCINE) IN NACL 2-0.9 UNIT/ML-% IJ SOLN
INTRAMUSCULAR | Status: DC | PRN
  Administered 2015-11-02: 1500 mL

## 2015-11-02 MED ORDER — AMIODARONE HCL 150 MG/3ML IV SOLN
INTRAVENOUS | Status: DC | PRN
  Administered 2015-11-02 (×2): 150 mg via INTRAVENOUS

## 2015-11-02 MED ORDER — POTASSIUM CHLORIDE 20 MEQ/15ML (10%) PO SOLN
40.0000 meq | Freq: Once | ORAL | Status: AC
  Administered 2015-11-02: 40 meq via ORAL
  Filled 2015-11-02: qty 30

## 2015-11-02 SURGICAL SUPPLY — 15 items
BALLN LINEAR 7.5FR IABP 40CC (BALLOONS) ×3
BALLOON LINEAR 7.5FR IABP 40CC (BALLOONS) ×2 IMPLANT
CATH INFINITI 5FR MULTPACK ANG (CATHETERS) ×3 IMPLANT
CATH SWAN GANZ 7F STRAIGHT (CATHETERS) ×3 IMPLANT
DEVICE SECURE STATLOCK IABP (MISCELLANEOUS) ×6 IMPLANT
ELECT DEFIB PAD ADLT CADENCE (PAD) ×3 IMPLANT
HOVERMATT SINGLE USE (MISCELLANEOUS) ×3 IMPLANT
KIT HEART LEFT (KITS) ×3 IMPLANT
PACK CARDIAC CATHETERIZATION (CUSTOM PROCEDURE TRAY) ×3 IMPLANT
SHEATH PINNACLE 6F 10CM (SHEATH) ×3 IMPLANT
SHEATH PINNACLE 7F 10CM (SHEATH) ×3 IMPLANT
TRANSDUCER W/STOPCOCK (MISCELLANEOUS) ×6 IMPLANT
TUBING CIL FLEX 10 FLL-RA (TUBING) ×3 IMPLANT
WIRE EMERALD 3MM-J .025X260CM (WIRE) ×3 IMPLANT
WIRE EMERALD 3MM-J .035X150CM (WIRE) ×3 IMPLANT

## 2015-11-02 NOTE — Progress Notes (Signed)
ANTICOAGULATION CONSULT NOTE - Initial Consult  Pharmacy Consult for heparin Indication: IABP  Allergies  Allergen Reactions  . Losartan Shortness Of Breath  . Lisinopril Rash    Patient Measurements: Height: 5\' 10"  (177.8 cm) Weight: 246 lb 12.8 oz (111.948 kg) IBW/kg (Calculated) : 73 Heparin Dosing Weight: 99 kg  Vital Signs: Temp: 98.5 F (36.9 C) (03/27 0715) Temp Source: Axillary (03/27 0715) BP: 114/63 mmHg (03/27 1502) Pulse Rate: 92 (03/27 1502)  Labs:  Recent Labs  10/31/15 0327  11/01/15 0400  11/02/15 0020 11/02/15 0748 11/02/15 0846 11/02/15 1234  HGB 8.8*  < > 8.7*  --  8.7*  --  9.5* 9.9*  HCT 25.4*  < > 27.0*  --  26.8*  --  28.0* 29.0*  PLT 168  --  213  --  203  --   --   --   CREATININE 1.91*  < > 2.02*  < > 1.85*  --  2.10* 1.90*  TROPONINI  --   --   --   --   --  1.12*  --   --   < > = values in this interval not displayed.  Estimated Creatinine Clearance: 55.7 mL/min (by C-G formula based on Cr of 1.9).  Assessment: 55 yo male now with IABP (second time) sp CABG 3/21  Pt is on amio/lido/procainamide s/p multiple VF arrests  Sheath removed 1415  Goal of Therapy:  Heparin level 0.2 - 0.5 units/ml Monitor platelets by anticoagulation protocol: Yes   Plan:  Start heparin infusion at 1100 units/hr to begin at 2230 Check anti-Xa level in 8 hours and daily while on heparin (with am labs) Continue to monitor H&H and platelets  Isaac Bliss, PharmD, BCPS, [facility] Clinical Pharmacist Pager [phone] 11/02/2015 3:25 PM

## 2015-11-02 NOTE — Progress Notes (Signed)
Pt.'s girlfriend is very concerned and upset over the condition of the pt. Provided support and prayer for her and the pt.

## 2015-11-02 NOTE — Significant Event (Signed)
Pt in sustained VT, defib 120J x1, immediate return to AV pacing

## 2015-11-02 NOTE — Significant Event (Signed)
Pt in sustained VT, defib x 1 at 120J, immediate return to AV pacing 

## 2015-11-02 NOTE — Plan of Care (Signed)
Problem: Fluid Volume: Goal: Ability to maintain a balanced intake and output will improve Outcome: Progressing Pt is receiving aggressive diuresis at this time, monitoring CVPs and weights  Problem: Cardiac: Goal: Hemodynamic stability will improve Outcome: Progressing Pt is more guarded stable tonight Goal: Ability to maintain an adequate cardiac output will improve Outcome: Progressing Pt has IABP for assistance and rest for heart  Problem: Coping: Goal: Ability to adjust to condition or change in health will improve Outcome: Progressing Pt is anxious but able to rest for periods, improved over last several nights  Problem: Nutritional: Goal: Risk for body nutrition deficit will decrease Outcome: Progressing Started TFs today  Problem: Physical Regulation: Goal: Postoperative complications will be avoided or minimized Outcome: Progressing Neuro status WNL, good blood sugar control  Problem: Pain Management: Goal: Pain level will decrease Outcome: Progressing Pt is currently comfortable  Problem: Urinary Elimination: Goal: Ability to achieve and maintain adequate renal perfusion and functioning will improve Outcome: Progressing Improvement noted in labs  Problem: Cardiac: Goal: Ability to achieve and maintain adequate cardiopulmonary perfusion will improve Outcome: Not Progressing Pt is still have nonsustained VT, on antiarrythmic gtts

## 2015-11-02 NOTE — Progress Notes (Signed)
Cardiology updated for further orders/plan of care

## 2015-11-02 NOTE — Significant Event (Signed)
Pt in sustained VT, Cardiology MD at bedside, defib at 120Jx1, immediate return to AV pacing, orders received for Amio bolus from current infusion, giving now, K bolus started now

## 2015-11-02 NOTE — Progress Notes (Signed)
Event Note   Mr. Cullinane had a recurrent run of sustained VT requiring cardioversion with one shock.  While I was present in the unit he had an additional episode requiring a shock.  He remains on amiodarone and lidocaine infusions.  Potasium is low at 3.3 and will be supplemented.  Co-ox is pending.  We will bolus with amiodarone 150mg  x1.  We will also check a lidocaine level.  Will increase his sedation both for pain control and to lower his adrenergic surge.  He is diuresing well.  Net -1.6L in the last 24 hours. CVP 18.  Renal function improving.    Aleasha Fregeau C. Duke Salvia, MD, [facility]  11/02/2015  1:22 AM

## 2015-11-02 NOTE — Significant Event (Signed)
Pt in sustained VT, defib x 1 at 120J, immediate return to AV pacing

## 2015-11-02 NOTE — Significant Event (Addendum)
Pt went into sustained VT, defib x1 at 150J, return to AV pacing via external pacer immediately after shock, labs are in process, Cardiology notified

## 2015-11-02 NOTE — Consult Note (Signed)
Reason for Consult: Pulseless VT/VF Referring Physician: Dr. Daleen Snook is an 55 y.o. male.   HPI: The patient is a 55 year old man who sustained an inferior myocardial infarction complicated by shock approximate 2 weeks ago, and underwent emergent catheterization followed by bypass grafting approximately one week ago. The patient developed pulseless polymorphic ventricular tachycardia/fibrillation and received multiple defibrillation shocks, restoring sinus rhythm. He was placed on intravenous amiodarone, lidocaine, and procainamide. Surprisingly, his blood pressure tolerated all the above and with the initiation of procainamide, his ventricular arrhythmias quieted. He was found to have a low output state despite reasonably well compensated blood pressures and underwent left and right heart catheterization, demonstrating patent grafts and LIMA, although his bypass grafts demonstrated a slow flow state. An intra-aortic balloon pump was placed as well. Since then, he has been fairly stable. The plan was to wean off his positive inotropes and then try and wean off his procainamide as tolerated. The patient is intubated although when I saw him earlier today was awake. He was not complaining of any chest pain. He does have severe left ventricular dysfunction postoperatively. The patient cannot provide much more in the way of review of systems.  PMH: Past Medical History  Diagnosis Date  . Hypertension   . TIA (transient ischemic attack)   . Stenosis of right carotid artery   . Complete heart block (HCC) 10/20/2015    PSHX: Past Surgical History  Procedure Laterality Date  . Arm surgery Right 2003  . Eye surgery    . Laceration repair    . Cardiac catheterization N/A 10/20/2015    Procedure: Left Heart Cath and Coronary Angiography;  Surgeon: Tonny Bollman, MD;  Location: [facility] INVASIVE CV LAB;  Service: Cardiovascular;  Laterality: N/A;  . Coronary artery bypass graft N/A  10/27/2015    Procedure: CORONARY ARTERY BYPASS GRAFTING (CABG) x four, using left internal mammary artery and rt leg greater saphenous vein harvested endoscopically;  Surgeon: Kerin Perna, MD;  Location: [facility] OR;  Service: Open Heart Surgery;  Laterality: N/A;  . Tee without cardioversion N/A 10/27/2015    Procedure: TRANSESOPHAGEAL ECHOCARDIOGRAM (TEE);  Surgeon: Kerin Perna, MD;  Location: [facility] OR;  Service: Open Heart Surgery;  Laterality: N/A;  . Endarterectomy Right 10/27/2015    Procedure: RIGHT ENDARTERECTOMY CAROTID with patch angioplasty using Xenosure bovine pericardium patch;  Surgeon: Fransisco Hertz, MD;  Location: [facility] OR;  Service: Vascular;  Laterality: Right;    FAMHX: Family History  Problem Relation Age of Onset  . Heart disease Mother   . Failure to thrive Father   . Congestive Heart Failure Father     Deceased  . Breast cancer Sister     Living    Social History:  reports that he quit smoking about 13 years ago. He has never used smokeless tobacco. He reports that he uses illicit drugs (Marijuana). He reports that he does not drink alcohol.  Allergies:  Allergies  Allergen Reactions  . Losartan Shortness Of Breath  . Lisinopril Rash    Medications: I have reviewed the patient's current medications.  Dg Chest Port 1 View  11/02/2015  CLINICAL DATA:  CABG. EXAM: PORTABLE CHEST 1 VIEW COMPARISON:  11/01/2015. FINDINGS: Endotracheal tube, NG tube, right PICC line, bilateral chest tubes in stable position. Prior CABG. Stable cardiomegaly . Persistent mild interstitial prominence, congestive heart failure cannot be excluded. No interim change. Low lung volumes with basilar atelectasis . IMPRESSION: 1. Endotracheal tube, NG tube, right  PICC line, bilateral chest tubes in stable position . No pneumothorax. 2. Prior CABG. Cardiomegaly with pulmonary interstitial prominence and small left pleural effusion consistent with mild congestive heart failure. No interim change from  prior exam . Electronically Signed   By: Maisie Fus  Register   On: 11/02/2015 07:20   Dg Chest Port 1 View  11/01/2015  CLINICAL DATA:  Post CABG EXAM: PORTABLE CHEST 1 VIEW COMPARISON:  10/31/2015 FINDINGS: Cardiomediastinal silhouette is stable. Status post CABG. Stable endotracheal and NG tube position. Left chest tube is unchanged in position. Persistent central vascular congestion and mild interstitial prominence bilaterally suspicious for the mild interstitial edema. Left IJ sheath in place. Probable small left pleural effusion left basilar atelectasis or infiltrate. Right arm PICC line is unchanged in position. There is no pneumothorax. IMPRESSION: Status post CABG. Stable support apparatus. Stable left chest tube position. No pneumothorax. Again noted bilateral mild interstitial prominence and perihilar opacities suspicious for mild pulmonary edema. Probable small left pleural effusion left basilar atelectasis or infiltrate. Electronically Signed   By: Natasha Mead M.D.   On: 11/01/2015 09:57   Dg Abd Portable 1v  11/02/2015  CLINICAL DATA:  Assess feeding tube positioning EXAM: PORTABLE ABDOMEN - 1 VIEW COMPARISON:  None in PACs FINDINGS: The repeated KUB with has less motion artifact. The tip of the feeding tube and nasogastric tube lie in the region of the distal gastric body in the pre-pyloric region. The bowel gas pattern is within the limits of normal. Numerous tubes and lines and electrodes overlie the abdomen. IMPRESSION: The tips of the feeding tube and the esophagogastric tube lie in the region of the distal gastric body in the pre-pyloric region. Electronically Signed   By: David  [place] M.D.   On: 11/02/2015 12:16    ROS  As stated in the HPI and negative for all other systems.  Physical Exam  Vitals:Blood pressure 115/53, pulse 90, temperature 98 F (36.7 C), temperature source Oral, resp. rate 14, height 5\' 10"  (1.778 m), weight 246 lb 12.8 oz (111.948 kg), SpO2 95 %.  Acutely ill  appearing, intubated and sedated HEENT: Endotracheal tube in place Neck:  8 cm JVD, no thyromegally Lymphatics:  No adenopathy Back:  No CVA tenderness Lungs:  Scattered rales in the bases, no wheezes are present. HEART:  Regular rate rhythm, no murmurs, no rubs, no clicks, split S2 Abd: Obese, soft, positive bowel sounds, no organomegally, no rebound, no guarding Ext:  2 plus pulses, 1+ peripheral edema, no cyanosis, no clubbing, extremities are warm Skin:  No rashes no nodules Neuro:  CN II through XII intact, motor grossly intact  ECG - AV sequential pacing with frequent and consecutive polymorphic PVCs  Chest x-ray - reviewed  Cardiac catheterization data - reviewed  2-D echo - reviewed  Assessment/Plan: 1. Polymorphic ventricular tachycardia/fibrillation - his cath films were reviewed. There is no acute graft closure, although this is at risk secondary to the very low flow state. For now, would continue and attempt to wean the patient of inotropic therapy, and initially try to discontinue procainamide, continuing intravenous amiodarone and lidocaine. We'll need to watch lidocaine levels carefully as he is at risk for lidocaine toxicity. We'll also need to keep electrolytes replete. His current ventricular arrhythmia is not monomorphic VT. 2. Acute on chronic systolic heart failure - he is intubated. Hopefully his heart failure will improve and he can be extubated. 3. Ischemic cardiomyopathy - he has severe left ventricular dysfunction by echo. Hopefully his ventricle  will improve. The patient was said to have complete heart block following his MI, and will likely need biventricular ICD implantation prior to discharge from the hospital if he survives. 4. Complete heart block - hold for this will improve. His cardiac output would be benefited in sinus rhythm with intrinsic conduction.  Sharlot Gowda TaylorMD 11/02/2015, 6:19 PM

## 2015-11-02 NOTE — Progress Notes (Signed)
Day of Surgery Procedure(s) (LRB): Right/Left Heart Cath and Coronary/Graft Angiography (N/A) Intra-Aortic Balloon Pump Insertion Subjective: Had left and right heart cath today with IABP placement CI 2.0, cvp 19 and pcwp 23 All grafts open Potassium low- hypokalemia has been temporally related to ventricular arrhythmias- receiving 10 mEq hourly  started  on tube feeds with pandathick secretions with pulm infiltrates- fortaz srarted  Objective: Vital signs in last 24 hours: Temp:  [98 F (36.7 C)-98.5 F (36.9 C)] 98 F (36.7 C) (03/27 1600) Pulse Rate:  [87-135] 90 (03/27 1745) Cardiac Rhythm:  [-] A-V Sequential paced (03/27 1600) Resp:  [5-39] 14 (03/27 1745) BP: (101-138)/(53-89) 115/53 mmHg (03/27 1700) SpO2:  [91 %-100 %] 95 % (03/27 1745) Arterial Line BP: (94-165)/(52-90) 115/53 mmHg (03/27 1745) FiO2 (%):  [40 %-50 %] 50 % (03/27 1600) Weight:  [246 lb 12.8 oz (111.948 kg)] 246 lb 12.8 oz (111.948 kg) (03/27 0415)  Hemodynamic parameters for last 24 hours: CVP:  [18 mmHg-23 mmHg] 23 mmHg  Intake/Output from previous day: 03/26 0701 - 03/27 0700 In: 3499.8 [I.V.:2659.8; NG/GT:90; IV Piggyback:750] Out: 5530 [Urine:4500; Emesis/NG output:900; Chest Tube:130] Intake/Output this shift: Total I/O In: 4624.3 [I.V.:4244.3; NG/GT:30; IV Piggyback:350] Out: 2290 [Urine:1930; Emesis/NG output:200; Chest Tube:160] Responsive  Coarse breath sounds extrem warm and pink abd soft  Lab Results:  Recent Labs  11/01/15 0400 11/02/15 0020  11/02/15 1234 11/02/15 1539  WBC 11.4* 10.0  --   --   --   HGB 8.7* 8.7*  < > 9.9* 10.2*  HCT 27.0* 26.8*  < > 29.0* 30.0*  PLT 213 203  --   --   --   < > = values in this interval not displayed. BMET:  Recent Labs  11/02/15 0020  11/02/15 1535 11/02/15 1539  NA 132*  < > 134* 133*  K 3.3*  < > 3.5 3.4*  CL 90*  < > 91* 86*  CO2 28  --  30  --   GLUCOSE 120*  < > 127* 129*  BUN 51*  < > 48* 45*  CREATININE 1.85*  < > 1.85*  1.90*  CALCIUM 8.2*  --  8.2*  --   < > = values in this interval not displayed.  PT/INR: No results for input(s): LABPROT, INR in the last 72 hours. ABG    Component Value Date/Time   PHART 7.474* 11/02/2015 1354   HCO3 33.2* 11/02/2015 1354   HCO3 34.3* 11/02/2015 1354   TCO2 31 11/02/2015 1539   ACIDBASEDEF 2.0 10/30/2015 1802   O2SAT 57.7 11/02/2015 1550   CBG (last 3)   Recent Labs  11/01/15 2353 11/02/15 0401 11/02/15 0744  GLUCAP 114* 124* 124*    Assessment/Plan: S/P Procedure(s) (LRB): Right/Left Heart Cath and Coronary/Graft Angiography (N/A) Intra-Aortic Balloon Pump Insertion Diuresis Diabetes control enteral nutrition   LOS: 13 days    Christopher [name] 11/02/2015

## 2015-11-02 NOTE — Progress Notes (Signed)
Defib pads changed

## 2015-11-02 NOTE — Progress Notes (Signed)
CRITICAL VALUE ALERT  Critical value received:  Trop 1.12  Date of notification:  11/02/15  Time of notification:  0911  Critical value read back:Yes.    Nurse who received alert:  Lynetta Mare, RN  MD notified (1st page):  Tonye Becket, NP  Time of first page:  0912  MD notified (2nd page):  Time of second page:  Responding MD:  Tonye Becket, NP  Time MD responded:  [phone]  No new orders, patient to go to cath lab at 1330.  Hermina Barters, RN

## 2015-11-02 NOTE — Progress Notes (Addendum)
Patient ID: Christopher [name], male   DOB: 1960/10/26, 55 y.o.   MRN: [id number]   Christopher [name] is a 55 year old with a history of HTN, TIA, CHB, DMII, and marijuana use. On March 14th he presented to [facility] with increased dyspnea. On arrival he had inferior MI and CHB. Urgently transferred to [facility] for cath. LHC Ramus lesion, 70% stenosed, Prox LAD lesion, 95% stenosed, and prox RCA lesion, 100% stenosed. Developed pulmonary edema post cath and was diuresed IV lasix.   Cardiac Surgery consulted---10/27/15 S/P CABG x4 LIMA to LAD, SVG to DIAGONAL, SVG to RAMUS, INTERMEDIATE, SVG to RDA 3 and R CEA. Extubated 3/22. IABP removed 3/23 and remained on dopamine, milrinone, and epi. 3/23 had VF arrest requiring CPR shock x4. Re-intubated. CXR-infiltrate R lung base. Started on milrinone 0.2 mcg, amio drip, norepinephrine. Nitric oxide restarted at 10 ppm.  He had vfib arrest again 3/24 pm and lidocaine added.  VT last night again, 8 shocks, procainamide added and no further VT.  Now off norepinephrine and on milrinone 0.5 with co-ox 55% this morning.  Diuresed well yesterday, down 6 lbs.  CVP still 23 this morning.   ECG done with PPM off yesterday => atypical atrial flutter with rate about 160 and slow escape rhythm consistent with ongoing complete heart block.  Awake on vent.   ECHO 10/02/15 EF 25-30% RV normal AK entire anteroseptal and inferoseptal myocardium.  TEE 10/27/2015: EF 35-40% RV mildly dilated TR mod regurg.  ECHO 10/30/15: EF 25-30%  Scheduled Meds: . amiodarone  150 mg Intravenous Once  . antiseptic oral rinse  7 mL Mouth Rinse QID  . aspirin  325 mg Per Tube Daily  . atorvastatin  80 mg Oral q1800  . bisacodyl  10 mg Oral Daily   Or  . bisacodyl  10 mg Rectal Daily  . chlorhexidine gluconate (SAGE KIT)  15 mL Mouth Rinse BID  . docusate sodium  200 mg Oral Daily  . enoxaparin (LOVENOX) injection  40 mg Subcutaneous Q24H  . insulin aspart  0-24 Units Subcutaneous 6 times per day  .  insulin detemir  12 Units Subcutaneous BID  . levalbuterol  1.25 mg Nebulization TID  . metoCLOPramide (REGLAN) injection  10 mg Intravenous 4 times per day  . mometasone-formoterol  2 puff Inhalation BID  . pantoprazole (PROTONIX) IV  40 mg Intravenous Daily  . potassium chloride  10 mEq Intravenous Q1 Hr x 3  . potassium chloride  10 mEq Intravenous Q1 Hr x 4  . potassium chloride  40 mEq Oral BID  . sodium chloride flush  10-40 mL Intracatheter Q12H  . sodium chloride flush  3 mL Intravenous Q12H   Continuous Infusions: . sodium chloride    . sodium chloride 10 mL/hr at 11/01/15 1900  . sodium chloride 10 mL/hr at 11/02/15 0500  . amiodarone 30 mg/hr (11/02/15 0120)  . dexmedetomidine Stopped (11/02/15 0630)  . fentaNYL infusion INTRAVENOUS 125 mcg/hr (11/02/15 0500)  . furosemide (LASIX) infusion 10 mg/hr (11/01/15 1900)  . lactated ringers Stopped (10/29/15 1700)  . lactated ringers    . lidocaine 1 mg/min (11/01/15 1900)  . midazolam (VERSED) infusion 2 mg/hr (11/02/15 0600)  . milrinone 0.5 mcg/kg/min (11/02/15 0440)  . norepinephrine (LEVOPHED) Adult infusion Stopped (11/01/15 1930)  . procainamide (PRONESTYL) 4 MG/ML INFUSION 2 mg/min (11/02/15 0555)   PRN Meds:.albuterol, metoprolol, morphine injection, ondansetron (ZOFRAN) IV, oxyCODONE, sodium chloride flush, sodium chloride flush, traMADol    Filed Vitals:  11/02/15 0630 11/02/15 0645 11/02/15 0700 11/02/15 0715  BP:      Pulse: 89 89 89 96  Temp:      TempSrc:      Resp: 12 19 15 18   Height:      Weight:      SpO2: 96% 97% 97% 96%    Intake/Output Summary (Last 24 hours) at 11/02/15 0742 Last data filed at 11/02/15 0700  Gross per 24 hour  Intake 3499.83 ml  Output   5530 ml  Net -2030.17 ml    LABS: Basic Metabolic Panel:  Recent Labs  11/01/15 0400 11/01/15 1450 11/02/15 0020 11/02/15 0400  NA 133* 133* 132*  --   K 3.3* 3.6 3.3* 3.6  CL 93* 92* 90*  --   CO2 25 27 28   --   GLUCOSE 120*  116* 120*  --   BUN 49* 51* 51*  --   CREATININE 2.02* 2.03* 1.85*  --   CALCIUM 8.1* 8.3* 8.2*  --   MG 2.7*  --  2.5*  --    Liver Function Tests:  Recent Labs  11/01/15 0400 11/02/15 0020  AST 160* 144*  ALT 148* 156*  ALKPHOS 67 70  BILITOT 1.3* 1.6*  PROT 6.0* 6.0*  ALBUMIN 2.7* 2.6*   No results for input(s): LIPASE, AMYLASE in the last 72 hours. CBC:  Recent Labs  11/01/15 0400 11/02/15 0020  WBC 11.4* 10.0  HGB 8.7* 8.7*  HCT 27.0* 26.8*  MCV 90.6 89.9  PLT 213 203   Cardiac Enzymes: No results for input(s): CKTOTAL, CKMB, CKMBINDEX, TROPONINI in the last 72 hours. BNP: Invalid input(s): POCBNP D-Dimer: No results for input(s): DDIMER in the last 72 hours. Hemoglobin A1C: No results for input(s): HGBA1C in the last 72 hours. Fasting Lipid Panel: No results for input(s): CHOL, HDL, LDLCALC, TRIG, CHOLHDL, LDLDIRECT in the last 72 hours. Thyroid Function Tests: No results for input(s): TSH, T4TOTAL, T3FREE, THYROIDAB in the last 72 hours.  Invalid input(s): FREET3 Anemia Panel: No results for input(s): VITAMINB12, FOLATE, FERRITIN, TIBC, IRON, RETICCTPCT in the last 72 hours.  RADIOLOGY: Dg Chest 2 View  10/23/2015  CLINICAL DATA:  CHF.  Weakness. EXAM: CHEST  2 VIEW COMPARISON:  10/21/2015 FINDINGS: Multiple interstitial densities in the lower chest are suggestive for interstitial edema, right side greater than left. Heart size is upper limits of normal. Trachea is midline. Negative for a pneumothorax. Evidence for a small right pleural effusion. IMPRESSION: Interstitial densities in the lower chest, right side greater than left. Findings are suggestive for interstitial pulmonary edema. Difficult to exclude some airspace disease in the right lower lung. Evidence for small right pleural effusion. Electronically Signed   By: Markus Daft M.D.   On: 10/23/2015 08:27   Dg Chest Port 1 View  11/02/2015  CLINICAL DATA:  CABG. EXAM: PORTABLE CHEST 1 VIEW  COMPARISON:  11/01/2015. FINDINGS: Endotracheal tube, NG tube, right PICC line, bilateral chest tubes in stable position. Prior CABG. Stable cardiomegaly . Persistent mild interstitial prominence, congestive heart failure cannot be excluded. No interim change. Low lung volumes with basilar atelectasis . IMPRESSION: 1. Endotracheal tube, NG tube, right PICC line, bilateral chest tubes in stable position . No pneumothorax. 2. Prior CABG. Cardiomegaly with pulmonary interstitial prominence and small left pleural effusion consistent with mild congestive heart failure. No interim change from prior exam . Electronically Signed   By: [place]   On: 11/02/2015 07:20   Dg Chest Port 1 [address]  11/01/2015  CLINICAL DATA:  Post CABG EXAM: PORTABLE CHEST 1 VIEW COMPARISON:  10/31/2015 FINDINGS: Cardiomediastinal silhouette is stable. Status post CABG. Stable endotracheal and NG tube position. Left chest tube is unchanged in position. Persistent central vascular congestion and mild interstitial prominence bilaterally suspicious for the mild interstitial edema. Left IJ sheath in place. Probable small left pleural effusion left basilar atelectasis or infiltrate. Right arm PICC line is unchanged in position. There is no pneumothorax. IMPRESSION: Status post CABG. Stable support apparatus. Stable left chest tube position. No pneumothorax. Again noted bilateral mild interstitial prominence and perihilar opacities suspicious for mild pulmonary edema. Probable small left pleural effusion left basilar atelectasis or infiltrate. Electronically Signed   By: Lahoma Crocker M.D.   On: 11/01/2015 09:57   Dg Chest Port 1 View  10/31/2015  CLINICAL DATA:  55 year old male with a history of endotracheal tube placement Patient has undergone coronary artery bypass grafting x4 10/27/2015. Placement of intra aortic balloon pump at this date. Ischemic cardiomyopathy. EXAM: PORTABLE CHEST 1 VIEW COMPARISON:  10/30/2015. FINDINGS: Endotracheal  tube terminates approximately 3.4 cm above the carina, unchanged. Gastric tube terminates out of the field of view. Defibrillator pads project over the left and right chest. Bilateral thoracostomy tubes. Right upper extremity PICC. The mediastinal drain not visualized. Intra-aortic balloon pump not visualized. Surgical changes of median sternotomy and CABG. Mixed bilateral interstitial and airspace opacities. Low lung volumes. IMPRESSION: Low lung volumes with mixed bilateral interstitial and airspace opacities, similar to the comparison chest x-ray. Retrocardiac opacity may reflect persistent pleural fluid, and/or atelectasis. Surgical support apparatus appear unchanged, as above. Intra aortic balloon pump not visualized. Signed, Dulcy Fanny. Earleen Newport, DO [facility] Electronically Signed   By: Corrie Mckusick D.O.   On: 10/31/2015 07:25   Dg Chest Port 1 View  10/30/2015  CLINICAL DATA:  Post cavity.  Recent CABG. EXAM: PORTABLE CHEST 1 VIEW COMPARISON:  Portable film earlier today. FINDINGS: Overlying support apparatus redemonstrated. Tubes and lines remain stable. Elevated LEFT hemidiaphragm. No definite pneumothorax. Moderate vascular congestion may be increased. Cardiomegaly. IMPRESSION: Slight worsening aeration. Moderate vascular congestion may be increased. Electronically Signed   By: Staci Righter M.D.   On: 10/30/2015 18:39   Dg Chest Port 1 View  10/30/2015  CLINICAL DATA:  Status post coronary bypass grafting EXAM: PORTABLE CHEST 1 VIEW COMPARISON:  10/30/2015 FINDINGS: Cardiac shadow remains enlarged. Bilateral chest tubes are again seen. A right-sided PICC line and endotracheal tube are noted in satisfactory position. Left jugular sheath is noted. Elevation of left hemidiaphragm is noted with left basilar atelectasis. No pneumothorax is noted. IMPRESSION: Postsurgical change with tubes and lines as described. Mild left basilar atelectasis.  Electronically Signed   By: Inez Catalina M.D.   On: 10/30/2015 07:45   Dg Chest Port 1 View  10/30/2015  CLINICAL DATA:  A chest tube in place EXAM: PORTABLE CHEST 1 VIEW COMPARISON:  10/30/2015 FINDINGS: Postoperative changes in the mediastinum. Endotracheal tube with tip measuring 6.3 cm about the carina. Right PICC catheter with tip over the cavoatrial junction. Left central venous catheter sheath with tip over the left neck consistent location in the internal jugular vein. Bilateral chest tubes. Infiltration or atelectasis in the right lung base. Cardiac enlargement. No significant vascular congestion. No pneumothorax. IMPRESSION: Appliance positioned as described. Cardiac enlargement with infiltration or atelectasis in the right lung base. Bilateral chest tubes are present but no visualized pneumothorax. Electronically Signed   By: Gwyndolyn Saxon  Gerilyn Nestle M.D.   On: 10/30/2015 03:07   Dg Chest Port 1 View  10/30/2015  CLINICAL DATA:  Shortness of breath.  Code blue. EXAM: PORTABLE CHEST 1 VIEW COMPARISON:  10/29/2015 FINDINGS: Postoperative changes in the mediastinum. Bilateral chest tubes are present. Right PICC line with tip over the cavoatrial junction. Left central venous catheter or sheath with tip over the left side of the neck, likely in the left internal jugular vein. Cardiac enlargement without significant vascular congestion. Shallow inspiration with elevation of the left hemidiaphragm. Probable left pleural effusion. No definite pulmonary consolidation. IMPRESSION: Shallow inspiration. Probable left pleural effusion. Cardiac enlargement. Appliances appear in satisfactory position. Electronically Signed   By: Lucienne Capers M.D.   On: 10/30/2015 01:18   Dg Chest Port 1 View  10/29/2015  CLINICAL DATA:  Postop CABG 2 days ago. EXAM: PORTABLE CHEST 1 VIEW COMPARISON:  Portable chest x-ray of October 28, 2015 FINDINGS: The trachea and esophagus have been extubated. The cardiac silhouette remains  enlarged. The pulmonary vascularity is mildly engorged. The bilateral chest tubes and the mediastinal drain are in stable position. There is no pneumothorax or large pleural effusion. The Swan-Ganz catheter tip overlies a proximal right lower lobe pulmonary artery branch. The mediastinum is widened and accentuated by the hypo inflation. IMPRESSION: Bilateral hypoinflation following extubation of the trachea there is crowding of the pulmonary vascularity. Left basilar atelectasis or less likely pneumonia is more prominent today. Probable small left pleural effusion. The remaining support tubes and lines are in stable position. Electronically Signed   By: David  Martinique M.D.   On: 10/29/2015 07:40   Dg Chest Port 1 View  10/28/2015  CLINICAL DATA:  Post CABG, on ventilator, followup portable chest x-ray of 10/28/2015 EXAM: PORTABLE CHEST 1 VIEW COMPARISON:  None. FINDINGS: The tip of the endotracheal tube is approximately 5.2 cm above the carina. Aeration has improved slightly. Atelectasis remains at the lung bases left-greater-than-right. Swan-Ganz catheter tip is in the right lower lobe pulmonary artery and bilateral chest tubes remain. No pneumothorax is seen. Heart size is stable. IMPRESSION: 1. Improved aeration. 2. Bilateral chest tubes.  No pneumothorax. 3. Tip of endotracheal tube approximately 5.2 cm above the carina. Electronically Signed   By: Ivar Drape M.D.   On: 10/28/2015 08:01   Dg Chest Port 1 View  10/27/2015  CLINICAL DATA:  Status post CABG EXAM: PORTABLE CHEST 1 VIEW COMPARISON:  10/27/2015 FINDINGS: Unchanged tracheostomy tube. Swan-Ganz central venous line is now seen with tip advanced into the right perihilar region. Stable right chest tube with and left chest tube. NG tube again crosses the gastroesophageal junction. Stable mediastinal drain. Enlarged cardiac silhouette. Limited inspiratory effect with bibasilar opacities likely representing atelectasis. IMPRESSION: Anticipated  postoperative appearance with bibasilar atelectasis. Electronically Signed   By: Skipper Cliche M.D.   On: 10/27/2015 19:25   Dg Chest Portable 1 View  10/27/2015  CLINICAL DATA:  Status post coronary bypass grafting EXAM: PORTABLE CHEST 1 VIEW COMPARISON:  10/23/2015 FINDINGS: Cardiac shadow is mildly enlarged. A Swan-Ganz catheter is noted in the right pulmonary outflow tract. Bilateral thoracostomy catheters are seen. No pneumothorax is noted. An endotracheal tube is noted in satisfactory position. Intra-aortic balloon pump is noted over the aortic arch. Mild left basilar atelectasis is seen. Mild central vascular congestion is noted. IMPRESSION: Postoperative changes with tubes and lines as described above. Mild vascular congestion and left basilar atelectasis. Electronically Signed   By: Inez Catalina M.D.   On: 10/27/2015 17:23  Portable Chest X-ray 1 View  10/21/2015  CLINICAL DATA:  55 year old male with a history of acute systolic congestive heart failure EXAM: PORTABLE CHEST 1 VIEW COMPARISON:  10/20/2015 FINDINGS: Heart size unchanged, enlarged. Similar appearance of low lung volumes with interstitial opacities, interlobular septal thickening, and developing airspace disease of the bilateral lower lungs. No pneumothorax. Linear opacity overlying the mediastinum and the heart border, of on certain significance. Favored to overlie the patient. IMPRESSION: Evidence of worsening congestive heart failure, with increasing edema. New linear opacity overlies the mediastinum, favored to be overlying the patient, though cannot be localized on this view. Signed, Dulcy Fanny. Earleen Newport, DO [facility] Electronically Signed   By: Corrie Mckusick D.O.   On: 10/21/2015 07:16   Dg Chest Portable 1 View  10/20/2015  CLINICAL DATA:  Shortness of breath. EXAM: PORTABLE CHEST 1 VIEW COMPARISON:  None. FINDINGS: Midline trachea. Cardiomegaly accentuated by AP  portable technique. No pleural effusion or pneumothorax. Mildly low lung volumes with bibasilar atelectasis. IMPRESSION: Mild cardiomegaly and low lung volumes.  No acute findings. Electronically Signed   By: Abigail Miyamoto M.D.   On: 10/20/2015 18:09    PHYSICAL EXAM General: Awake on vent Neck: JVP 12+ cm, no thyromegaly or thyroid nodule.  Lungs: Decreased breath sounds at bases bilaterally.  CV: Nondisplaced PMI.  Heart regular S1/S2, no S3/S4, no murmur.  2+ edema to knee on right (vein harvest leg), 1+ on left.  Abdomen: Soft, nontender, no hepatosplenomegaly, no distention.  Neurologic: Alert and follows commands.  Psych: Normal affect. Extremities: No clubbing or cyanosis.   TELEMETRY: Reviewed telemetry, v-pacing.  VT runs last night.   ASSESSMENT AND PLAN:  1. CAD: S/p late presentation inferior MI and CABG x 4. Continue statin, ASA.  - Multiple episodes of VT.  Cycle cardiac enzymes.  Will discuss repeat coronary angiography with Dr Prescott Gum this morning.  2. Cardiogenic shock: Acute systolic CHF with RV failure post inferior MI with suspected RV involvement. EF 25-30% on echo (difficult images on 3/24 echo). Co-ox 55% this morning on milrinone 0.5, remains marginal.  Also has significant RV failure from suspected RV infarct.   - Still with marginal output.  Will discuss RHC and IABP placement with Dr Prescott Gum (probably would hold off on Impella given frequent VT). Unfortunately this will not help much with RV failure.  - He is back on NO at 10 ppm after decompensation in setting of RV failure.  - CVP 20+, ongoing marked volume overload. He diuresed well yesterday on Lasix gtt. I hope that diuresis and decrease in RV size/overload will help overall hemodynamics.  Continue Lasix gtt and add metolazone 2.5 x 1 again today.  Replace K aggressively and check Mg.  3. Complete heart block:  Underlying CHB still.   Atrial rhythm actually appears to be an atypical atrial flutter.  He  is pacing at 90 bpm. Suspect he will need permanent pacing, would use BiV device.   4. VF arrest 3/24 early am and again at night 3/24.  VT overnight 3/26: Remains on amiodarone gtt and lidocaine gtt added intially, now on procainamide.  Suspect VT due to hemodynamics (poor cardiac output) but must also consider early graft failure.   Will have EP see today and will discuss coronary angiography + possible IABP placement today with Dr Prescott Gum.  Given VF > 48 hrs post revascularization, likely should get defibrillator (BiV-ICD) when stabilized.  Check Mg, replace K.  Will send lidocaine level today.  5. AKI: Creatinine down to 1.8 today.  Will try to use minimal dye for angiography, will just check grafts.   45 minutes critical care time.   Loralie Champagne 11/02/2015 7:42 AM

## 2015-11-02 NOTE — Significant Event (Signed)
defib x 1 for sustained VT, immediate return to AV pacing

## 2015-11-02 NOTE — Progress Notes (Signed)
Christopher [name] continues to have VT and has been shocked 8 times tonight.  Potassium has been supplemented and he received a bolus of amiodarone.  We will check a lidocaine level.  Start procainamide bolus and infusion.  We will also fully sedate him with fentanyl and versed.  Will discontinue precedex.  Co-ox was 42.6, so Milrinone was increased to 0.3mcg/kg/hr.  EP and HFS to see first thing this AM.

## 2015-11-02 NOTE — Significant Event (Signed)
Pt in sustained VT following ETT suction, defib x1 at 150J, immediate return to AV pacing

## 2015-11-02 NOTE — Progress Notes (Addendum)
[facility] [place]  Subjective  - Patient re intubated, eyes open and following commands.   Objective 97/65 96 98.3 F (36.8 C) (Axillary) 18 96%  Intake/Output Summary (Last 24 hours) at 11/02/15 0716 Last data filed at 11/02/15 0600  Gross per 24 hour  Intake 3344.78 ml  Output   5530 ml  Net -2185.22 ml    Feet warm and well perfused no skin changes with short term Levophed use Right neck incision soft without hematoma BUE grip 5/5 Heart tachycardia, AV paced Lungs intubated  Assessment/Planning: POD #6 right CEA  - Nothing more to add from a vascular viewpoint - Available as needed - Incision check in 2 weeks   Thomasena Edis [facility] 11/02/2015 7:16 AM --  Laboratory Lab Results:  Recent Labs  11/01/15 0400 11/02/15 0020  WBC 11.4* 10.0  HGB 8.7* 8.7*  HCT 27.0* 26.8*  PLT 213 203   BMET  Recent Labs  11/01/15 1450 11/02/15 0020 11/02/15 0400  NA 133* 132*  --   K 3.6 3.3* 3.6  CL 92* 90*  --   CO2 27 28  --   GLUCOSE 116* 120*  --   BUN 51* 51*  --   CREATININE 2.03* 1.85*  --   CALCIUM 8.3* 8.2*  --     COAG Lab Results  Component Value Date   INR 1.82* 10/29/2015   INR 1.59* 10/27/2015   INR 1.24 10/20/2015   No results found for: PTT  Addendum  I have independently interviewed and examined the patient, and I agree with the physician assistant's findings.  Weekend events noted.  No neuro events over the weekend.  R neck without hematoma.  Neuro intact with sym hand grips.  Nothing more to add to this patient's care.  Will check on the incision in 2 weeks.  Leonides Sake, MD [facility] [place] Office: [phone] Pager: [phone]  11/02/2015, 12:29 PM

## 2015-11-02 NOTE — Progress Notes (Signed)
      [address]       [place] [address]             [phone]      PM ROUNDS  Christopher [name] is a 55 year old with a history of HTN, TIA, CHB, type II DM, and marijuana use. On March 14th he presented to [facility] with increased dyspnea secondary to an inferior MI with CHB. Urgently transferred to [facility] for cath. Developed pulmonary edema post cath and was diuresed IV lasix.   Cardiac Surgery consulted---On 10/27/15 he had CABG x 4 and Right CEA. Extubated on 3/22. IABP removed on 3/23. He was still on dopamine, milrinone, and epi. 3/23 had VF arrest requiring CPR was shocked x 4. Re-intubated. Started on milrinone 0.2 mcg, amiodarone drip, and norepinephrine. Nitric oxide restarted at 10 ppm. He had vfib arrest again 3/24 pm and lidocaine added.  Overnight multiple episodes of VT shocked 8 times, procainamide added. Now off norepinephrine and on milrinone 0.5 with co-ox 55% this morning. Diuresed well yesterday, down 6 lbs. CVP still 23 this morning. He had IABP replaced today and has had no further VT.  Intubated, sedated  BP 115/53 mmHg  Pulse 89  Temp(Src) 98 F (36.7 C) (Oral)  Resp 13  Ht 5\' 10"  (1.778 m)  Wt 246 lb 12.8 oz (111.948 kg)  BMI 35.41 kg/m2  SpO2 95%  Co-ox= 57  Intake/Output Summary (Last 24 hours) at 11/02/15 1743 Last data filed at 11/02/15 1714  Gross per 24 hour  Intake 6630.46 ml  Output   5580 ml  Net 1050.46 ml   Remains critically ill on IABP, multiple pressors and multiple anti-arrhythmics. Continue with present plan  Salvatore Decent. Dorris Fetch, MD [facility] [phone]

## 2015-11-03 ENCOUNTER — Encounter (HOSPITAL_COMMUNITY): Payer: Self-pay | Admitting: Cardiology

## 2015-11-03 ENCOUNTER — Inpatient Hospital Stay (HOSPITAL_COMMUNITY): Payer: BLUE CROSS/BLUE SHIELD

## 2015-11-03 DIAGNOSIS — J9601 Acute respiratory failure with hypoxia: Secondary | ICD-10-CM

## 2015-11-03 LAB — CBC
HCT: 26.7 % — ABNORMAL LOW (ref 39.0–52.0)
Hemoglobin: 8.9 g/dL — ABNORMAL LOW (ref 13.0–17.0)
MCH: 30.5 pg (ref 26.0–34.0)
MCHC: 33.3 g/dL (ref 30.0–36.0)
MCV: 91.4 fL (ref 78.0–100.0)
PLATELETS: 271 10*3/uL (ref 150–400)
RBC: 2.92 MIL/uL — AB (ref 4.22–5.81)
RDW: 15.9 % — ABNORMAL HIGH (ref 11.5–15.5)
WBC: 9.9 10*3/uL (ref 4.0–10.5)

## 2015-11-03 LAB — POCT I-STAT, CHEM 8
BUN: 47 mg/dL — AB (ref 6–20)
BUN: 49 mg/dL — AB (ref 6–20)
BUN: 49 mg/dL — AB (ref 6–20)
BUN: 50 mg/dL — ABNORMAL HIGH (ref 6–20)
CALCIUM ION: 1.02 mmol/L — AB (ref 1.12–1.23)
CALCIUM ION: 1.03 mmol/L — AB (ref 1.12–1.23)
CHLORIDE: 87 mmol/L — AB (ref 101–111)
CHLORIDE: 89 mmol/L — AB (ref 101–111)
CHLORIDE: 89 mmol/L — AB (ref 101–111)
CHLORIDE: 90 mmol/L — AB (ref 101–111)
Calcium, Ion: 1.03 mmol/L — ABNORMAL LOW (ref 1.12–1.23)
Calcium, Ion: 1.04 mmol/L — ABNORMAL LOW (ref 1.12–1.23)
Creatinine, Ser: 2.1 mg/dL — ABNORMAL HIGH (ref 0.61–1.24)
Creatinine, Ser: 2.2 mg/dL — ABNORMAL HIGH (ref 0.61–1.24)
Creatinine, Ser: 2.3 mg/dL — ABNORMAL HIGH (ref 0.61–1.24)
Creatinine, Ser: 2.3 mg/dL — ABNORMAL HIGH (ref 0.61–1.24)
Glucose, Bld: 121 mg/dL — ABNORMAL HIGH (ref 65–99)
Glucose, Bld: 137 mg/dL — ABNORMAL HIGH (ref 65–99)
Glucose, Bld: 140 mg/dL — ABNORMAL HIGH (ref 65–99)
Glucose, Bld: 161 mg/dL — ABNORMAL HIGH (ref 65–99)
HEMATOCRIT: 28 % — AB (ref 39.0–52.0)
HEMATOCRIT: 28 % — AB (ref 39.0–52.0)
HEMATOCRIT: 28 % — AB (ref 39.0–52.0)
HEMATOCRIT: 30 % — AB (ref 39.0–52.0)
HEMOGLOBIN: 9.5 g/dL — AB (ref 13.0–17.0)
Hemoglobin: 10.2 g/dL — ABNORMAL LOW (ref 13.0–17.0)
Hemoglobin: 9.5 g/dL — ABNORMAL LOW (ref 13.0–17.0)
Hemoglobin: 9.5 g/dL — ABNORMAL LOW (ref 13.0–17.0)
POTASSIUM: 4 mmol/L (ref 3.5–5.1)
POTASSIUM: 4.1 mmol/L (ref 3.5–5.1)
POTASSIUM: 4.3 mmol/L (ref 3.5–5.1)
Potassium: 3.8 mmol/L (ref 3.5–5.1)
SODIUM: 133 mmol/L — AB (ref 135–145)
SODIUM: 133 mmol/L — AB (ref 135–145)
SODIUM: 134 mmol/L — AB (ref 135–145)
Sodium: 133 mmol/L — ABNORMAL LOW (ref 135–145)
TCO2: 33 mmol/L (ref 0–100)
TCO2: 33 mmol/L (ref 0–100)
TCO2: 34 mmol/L (ref 0–100)
TCO2: 34 mmol/L (ref 0–100)

## 2015-11-03 LAB — GLUCOSE, CAPILLARY
GLUCOSE-CAPILLARY: 127 mg/dL — AB (ref 65–99)
GLUCOSE-CAPILLARY: 133 mg/dL — AB (ref 65–99)
GLUCOSE-CAPILLARY: 146 mg/dL — AB (ref 65–99)
GLUCOSE-CAPILLARY: 148 mg/dL — AB (ref 65–99)
Glucose-Capillary: 155 mg/dL — ABNORMAL HIGH (ref 65–99)
Glucose-Capillary: 139 mg/dL — ABNORMAL HIGH (ref 65–99)
Glucose-Capillary: 114 mg/dL — ABNORMAL HIGH (ref 65–99)

## 2015-11-03 LAB — BASIC METABOLIC PANEL
ANION GAP: 11 (ref 5–15)
BUN: 52 mg/dL — AB (ref 6–20)
CO2: 31 mmol/L (ref 22–32)
Calcium: 8.1 mg/dL — ABNORMAL LOW (ref 8.9–10.3)
Chloride: 93 mmol/L — ABNORMAL LOW (ref 101–111)
Creatinine, Ser: 2.25 mg/dL — ABNORMAL HIGH (ref 0.61–1.24)
GFR calc Af Amer: 36 mL/min — ABNORMAL LOW (ref >60)
GFR calc non Af Amer: 31 mL/min — ABNORMAL LOW (ref >60)
GLUCOSE: 173 mg/dL — AB (ref 65–99)
POTASSIUM: 4.3 mmol/L (ref 3.5–5.1)
Sodium: 135 mmol/L (ref 135–145)

## 2015-11-03 LAB — COMPREHENSIVE METABOLIC PANEL
ALT: 139 U/L — ABNORMAL HIGH (ref 17–63)
AST: 101 U/L — ABNORMAL HIGH (ref 15–41)
Albumin: 2.4 g/dL — ABNORMAL LOW (ref 3.5–5.0)
Alkaline Phosphatase: 73 U/L (ref 38–126)
Anion gap: 11 (ref 5–15)
BUN: 51 mg/dL — ABNORMAL HIGH (ref 6–20)
CO2: 32 mmol/L (ref 22–32)
Calcium: 8.1 mg/dL — ABNORMAL LOW (ref 8.9–10.3)
Chloride: 92 mmol/L — ABNORMAL LOW (ref 101–111)
Creatinine, Ser: 2.13 mg/dL — ABNORMAL HIGH (ref 0.61–1.24)
GFR calc Af Amer: 39 mL/min — ABNORMAL LOW (ref >60)
GFR calc non Af Amer: 33 mL/min — ABNORMAL LOW (ref >60)
Glucose, Bld: 146 mg/dL — ABNORMAL HIGH (ref 65–99)
Potassium: 3.9 mmol/L (ref 3.5–5.1)
Sodium: 135 mmol/L (ref 135–145)
Total Bilirubin: 1.3 mg/dL — ABNORMAL HIGH (ref 0.3–1.2)
Total Protein: 6.1 g/dL — ABNORMAL LOW (ref 6.5–8.1)

## 2015-11-03 LAB — HEPARIN LEVEL (UNFRACTIONATED)
Heparin Unfractionated: 0.14 IU/mL — ABNORMAL LOW (ref 0.30–0.70)
Heparin Unfractionated: 0.3 IU/mL (ref 0.30–0.70)
Heparin Unfractionated: 0.33 IU/mL (ref 0.30–0.70)

## 2015-11-03 LAB — POCT I-STAT 3, ART BLOOD GAS (G3+)
Acid-Base Excess: 10 mmol/L — ABNORMAL HIGH (ref 0.0–2.0)
Bicarbonate: 36.3 mEq/L — ABNORMAL HIGH (ref 20.0–24.0)
O2 SAT: 96 %
PCO2 ART: 54.6 mmHg — AB (ref 35.0–45.0)
PH ART: 7.43 (ref 7.350–7.450)
TCO2: 38 mmol/L (ref 0–100)
pO2, Arterial: 83 mmHg (ref 80.0–100.0)

## 2015-11-03 LAB — CARBOXYHEMOGLOBIN
CARBOXYHEMOGLOBIN: 1.6 % — AB (ref 0.5–1.5)
Carboxyhemoglobin: 1.4 % (ref 0.5–1.5)
METHEMOGLOBIN: 1.1 % (ref 0.0–1.5)
Methemoglobin: 1.1 % (ref 0.0–1.5)
O2 Saturation: 63.6 %
O2 Saturation: 67 %
Total hemoglobin: 8.8 g/dL — ABNORMAL LOW (ref 13.5–18.0)
Total hemoglobin: 9.1 g/dL — ABNORMAL LOW (ref 13.5–18.0)

## 2015-11-03 LAB — MAGNESIUM: Magnesium: 2.5 mg/dL — ABNORMAL HIGH (ref 1.7–2.4)

## 2015-11-03 LAB — PROCALCITONIN: PROCALCITONIN: 1.75 ng/mL

## 2015-11-03 LAB — LIDOCAINE LEVEL: Lidocaine Lvl: 2 ug/mL (ref 1.5–5.0)

## 2015-11-03 LAB — PREALBUMIN: Prealbumin: 8.4 mg/dL — ABNORMAL LOW (ref 18–38)

## 2015-11-03 MED ORDER — POTASSIUM CHLORIDE 10 MEQ/50ML IV SOLN
10.0000 meq | INTRAVENOUS | Status: AC
  Administered 2015-11-03 (×3): 10 meq via INTRAVENOUS
  Filled 2015-11-03 (×2): qty 50

## 2015-11-03 MED ORDER — POTASSIUM CHLORIDE 10 MEQ/50ML IV SOLN
10.0000 meq | INTRAVENOUS | Status: AC
  Administered 2015-11-03 (×3): 10 meq via INTRAVENOUS
  Filled 2015-11-03 (×3): qty 50

## 2015-11-03 MED ORDER — POTASSIUM CHLORIDE 20 MEQ/15ML (10%) PO SOLN
60.0000 meq | Freq: Three times a day (TID) | ORAL | Status: DC
  Administered 2015-11-03 – 2015-11-05 (×7): 60 meq via ORAL
  Filled 2015-11-03 (×7): qty 45

## 2015-11-03 MED ORDER — FENTANYL BOLUS VIA INFUSION
25.0000 ug | INTRAVENOUS | Status: DC | PRN
  Administered 2015-11-07 – 2015-11-12 (×5): 50 ug via INTRAVENOUS
  Filled 2015-11-03: qty 50

## 2015-11-03 MED ORDER — IOHEXOL 300 MG/ML  SOLN
50.0000 mL | Freq: Once | INTRAMUSCULAR | Status: AC | PRN
  Administered 2015-11-03: 30 mL

## 2015-11-03 MED ORDER — POTASSIUM CHLORIDE 10 MEQ/50ML IV SOLN
10.0000 meq | INTRAVENOUS | Status: AC
Start: 2015-11-03 — End: 2015-11-03
  Administered 2015-11-03 (×2): 10 meq via INTRAVENOUS

## 2015-11-03 MED ORDER — DEXTROSE 5 % IV SOLN
250.0000 mg | Freq: Once | INTRAVENOUS | Status: AC
  Administered 2015-11-03: 250 mg via INTRAVENOUS
  Filled 2015-11-03: qty 250

## 2015-11-03 MED ORDER — METOLAZONE 5 MG PO TABS
2.5000 mg | ORAL_TABLET | Freq: Once | ORAL | Status: AC
  Administered 2015-11-03: 2.5 mg via ORAL
  Filled 2015-11-03: qty 1

## 2015-11-03 MED ORDER — LEVALBUTEROL HCL 1.25 MG/0.5ML IN NEBU
1.2500 mg | INHALATION_SOLUTION | Freq: Four times a day (QID) | RESPIRATORY_TRACT | Status: DC
  Administered 2015-11-03 – 2015-11-15 (×46): 1.25 mg via RESPIRATORY_TRACT
  Filled 2015-11-03 (×46): qty 0.5

## 2015-11-03 MED ORDER — VITAL 1.5 CAL PO LIQD
1000.0000 mL | ORAL | Status: DC
  Administered 2015-11-03: 1000 mL
  Filled 2015-11-03 (×3): qty 1000

## 2015-11-03 MED ORDER — POTASSIUM CHLORIDE 10 MEQ/50ML IV SOLN
10.0000 meq | INTRAVENOUS | Status: AC
Start: 2015-11-03 — End: 2015-11-03
  Administered 2015-11-03 (×3): 10 meq via INTRAVENOUS
  Filled 2015-11-03 (×3): qty 50

## 2015-11-03 MED ORDER — LIDOCAINE VISCOUS 2 % MT SOLN
OROMUCOSAL | Status: AC
  Administered 2015-11-03: 3 mL via NASAL
  Filled 2015-11-03: qty 15

## 2015-11-03 MED ORDER — SENNOSIDES 8.8 MG/5ML PO SYRP
5.0000 mL | ORAL_SOLUTION | Freq: Two times a day (BID) | ORAL | Status: DC
  Administered 2015-11-03 – 2015-11-12 (×12): 5 mL
  Filled 2015-11-03 (×21): qty 5

## 2015-11-03 MED ORDER — LEVALBUTEROL HCL 1.25 MG/0.5ML IN NEBU
1.2500 mg | INHALATION_SOLUTION | Freq: Four times a day (QID) | RESPIRATORY_TRACT | Status: DC
  Administered 2015-11-03: 1.25 mg via RESPIRATORY_TRACT
  Filled 2015-11-03 (×2): qty 0.5

## 2015-11-03 MED FILL — Lidocaine HCl Local Preservative Free (PF) Inj 1%: INTRAMUSCULAR | Qty: 30 | Status: AC

## 2015-11-03 NOTE — Progress Notes (Signed)
ANTICOAGULATION CONSULT NOTE - Follow Up Consult  Pharmacy Consult for Heparin Indication: IABP  Allergies  Allergen Reactions  . Losartan Shortness Of Breath  . Lisinopril Rash    Patient Measurements: Height: 5\' 10"  (177.8 cm) Weight: 246 lb 14.4 oz (111.993 kg) IBW/kg (Calculated) : 73 Heparin Dosing Weight: 99kg  Vital Signs: Temp: 97.6 F (36.4 C) (03/28 1125) Temp Source: Axillary (03/28 1125) BP: 115/61 mmHg (03/28 1156) Pulse Rate: 89 (03/28 1200)  Labs:  Recent Labs  11/01/15 0400  11/02/15 0020 11/02/15 0748  11/02/15 1535  11/02/15 2000 11/03/15 0012 11/03/15 0345 11/03/15 0739 11/03/15 1100  HGB 8.7*  --  8.7*  --   < >  --   < >  --  10.2* 8.9* 9.5*  --   HCT 27.0*  --  26.8*  --   < >  --   < >  --  30.0* 26.7* 28.0*  --   PLT 213  --  203  --   --   --   --   --   --  271  --   --   HEPARINUNFRC  --   --   --   --   --   --   --   --   --  0.14*  --  0.30  CREATININE 2.02*  < > 1.85*  --   < > 1.85*  < >  --  2.10* 2.13* 2.20*  --   TROPONINI  --   --   --  1.12*  --  1.15*  --  1.12*  --   --   --   --   < > = values in this interval not displayed.  Estimated Creatinine Clearance: 48.1 mL/min (by C-G formula based on Cr of 2.2).   Medications:  Heparin @ 1200 units/hr  Assessment: 55yom continues on heparin for IABP that was placed yesterday in the cath lab. Heparin level is therapeutic at 0.30. CBC stable. No bleeding reported.  Goal of Therapy:  Heparin level 0.2-0.5 units/ml Monitor platelets by anticoagulation protocol: Yes   Plan:  1) Continue heparin at 1200 units/hr 2) 6 hour heparin level to confirm  Fredrik Rigger 11/03/2015,12:18 PM

## 2015-11-03 NOTE — Progress Notes (Signed)
Patient ID: Christopher [name], male   DOB: 02/27/1961, 55 y.o.   MRN: [id number]   Christopher [name] is a 55 year old with a history of HTN, TIA, CHB, DMII, and marijuana use. On March 14th he presented to [facility] with increased dyspnea. On arrival he had inferior MI and CHB. Urgently transferred to [facility] for cath. LHC Ramus lesion, 70% stenosed, Prox LAD lesion, 95% stenosed, and prox RCA lesion, 100% stenosed. Developed pulmonary edema post cath and was diuresed IV lasix.   Cardiac Surgery consulted---10/27/15 S/P CABG x4 LIMA to LAD, SVG to DIAGONAL, SVG to RAMUS, INTERMEDIATE, SVG to RDA 3 and R CEA. Extubated 3/22. IABP removed 3/23 and remained on dopamine, milrinone, and epi. 3/23 had VF arrest requiring CPR shock x4. Re-intubated. CXR-infiltrate R lung base. Started on milrinone 0.2 mcg, amio drip, norepinephrine. Nitric oxide restarted at 10 ppm.  He had vfib arrest again 3/24 pm and lidocaine added.  VT 3/26-3/27 overnight again, 8 shocks, procainamide added and no further VT.  Stopped norepinephrine but milrinone up to 0.5 still with marginal co-ox.  In effort to decrease milrinone and decrease drive towards VT, he was taken back to cath lab and IABP placed again.  Milrinone decreased to 0.375.   Overnight, no prolonged VT (couple short VT episodes).  This morning, stable with CVP 19 and co-ox 64%. Awake/alert.   ECG done with PPM off 3/26 => atypical atrial flutter with rate about 160 and slow escape rhythm consistent with ongoing complete heart block.   ECHO 10/02/15 EF 25-30% RV normal AK entire anteroseptal and inferoseptal myocardium.  TEE 10/27/2015: EF 35-40% RV mildly dilated TR mod regurg.  ECHO 10/30/15: EF 25-30%  LHC/RHC/IABP placement 3/27 Left Anterior Descending  Native left coronary was not injected to save contrast. LIMA-LAD was patent. SVG-small diagonal was patent. There was marked size mismatch between graft and artery with resulting slow flow. There appeared to be about 80%  stenosis in the diagonal distal to touchdown of graft.      Ramus Intermedius  Native LCA not injected to save contrast. SVG-ramus patent. There was a size mismatch between large vein graft and smaller caliber native artery. There appeared to be 80% stenosis in the ramus distal to the SVG touchdown and 70% stenosis proximal to SVG touchdown.     Left Circumflex  Not visualized, native LCA not injected to save contrast.     Right Coronary Artery  Native RCA known to be occluded, not injected. SVG-PDA patent.       Right Heart Pressures Procedural Findings: Hemodynamics (mmHg), on milrinone 0.5 RA mean 19 RV 44/23 PA 43/25, mean 33 PCWP mean 26 AO 124/71 Oxygen saturations: PA 64% AO 100% Cardiac Output (Fick) 6.52  Cardiac Index (Fick) 2.86 Cardiac Output (Thermo) 4.97 Cardiac Index (Thermo) 2.18    Scheduled Meds: . amiodarone  150 mg Intravenous Once  . antiseptic oral rinse  7 mL Mouth Rinse QID  . aspirin  325 mg Per Tube Daily  . atorvastatin  80 mg Oral q1800  . bisacodyl  10 mg Oral Daily   Or  . bisacodyl  10 mg Rectal Daily  . cefTAZidime (FORTAZ)  IV  1 g Intravenous Q8H  . chlorhexidine gluconate (SAGE KIT)  15 mL Mouth Rinse BID  . docusate sodium  200 mg Oral Daily  . feeding supplement (VITAL 1.5 CAL)  1,000 mL Per Tube Q24H  . fentaNYL (SUBLIMAZE) injection  25 mcg Intravenous Once  . insulin aspart  0-24 Units Subcutaneous 6 times per day  . insulin detemir  12 Units Subcutaneous BID  . levalbuterol  1.25 mg Nebulization TID  . metolazone  2.5 mg Oral Once  . midazolam  1 mg Intravenous Once  . mometasone-formoterol  2 puff Inhalation BID  . pantoprazole (PROTONIX) IV  40 mg Intravenous Daily  . potassium chloride  10 mEq Intravenous Q1 Hr x 3  . potassium chloride  60 mEq Oral 3 times per day  . sodium chloride flush  10-40 mL Intracatheter Q12H   Continuous Infusions: . sodium chloride 10 mL/hr at 11/02/15 1900  . amiodarone 30 mg/hr  (11/03/15 0600)  . dexmedetomidine Stopped (11/02/15 0630)  . fentaNYL infusion INTRAVENOUS 125 mcg/hr (11/03/15 0050)  . furosemide (LASIX) infusion 8 mg/hr (11/02/15 1900)  . heparin 1,200 Units/hr (11/03/15 0500)  . lidocaine 1 mg/min (11/02/15 1900)  . midazolam (VERSED) infusion 3 mg/hr (11/02/15 1935)  . milrinone 0.375 mcg/kg/min (11/03/15 0300)  . norepinephrine (LEVOPHED) Adult infusion Stopped (11/01/15 1930)   PRN Meds:.acetaminophen, metoprolol, morphine injection, ondansetron (ZOFRAN) IV, ondansetron (ZOFRAN) IV, oxyCODONE, sodium chloride flush, traMADol    Filed Vitals:   11/03/15 0500 11/03/15 0600 11/03/15 0700 11/03/15 0719  BP:      Pulse: 89  89   Temp:    97.8 F (36.6 C)  TempSrc:    Axillary  Resp: 12 14 15    Height:      Weight: 246 lb 14.4 oz (111.993 kg)     SpO2: 95% 99% 94%     Intake/Output Summary (Last 24 hours) at 11/03/15 0742 Last data filed at 11/03/15 0700  Gross per 24 hour  Intake 4714.78 ml  Output   4720 ml  Net  -5.22 ml    LABS: Basic Metabolic Panel:  Recent Labs  11/02/15 0802  11/02/15 1535  11/03/15 0012 11/03/15 0345  NA  --   < > 134*  < > 133* 135  K  --   < > 3.5  < > 4.0 3.9  CL  --   < > 91*  < > 87* 92*  CO2  --   --  30  --   --  32  GLUCOSE  --   < > 127*  < > 161* 146*  BUN  --   < > 48*  < > 47* 51*  CREATININE  --   < > 1.85*  < > 2.10* 2.13*  CALCIUM  --   --  8.2*  --   --  8.1*  MG 2.6*  --   --   --   --  2.5*  < > = values in this interval not displayed. Liver Function Tests:  Recent Labs  11/02/15 0020 11/03/15 0345  AST 144* 101*  ALT 156* 139*  ALKPHOS 70 73  BILITOT 1.6* 1.3*  PROT 6.0* 6.1*  ALBUMIN 2.6* 2.4*   No results for input(s): LIPASE, AMYLASE in the last 72 hours. CBC:  Recent Labs  11/02/15 0020  11/03/15 0012 11/03/15 0345  WBC 10.0  --   --  9.9  HGB 8.7*  < > 10.2* 8.9*  HCT 26.8*  < > 30.0* 26.7*  MCV 89.9  --   --  91.4  PLT 203  --   --  271  < > = values  in this interval not displayed. Cardiac Enzymes:  Recent Labs  11/02/15 0748 11/02/15 1535 11/02/15 2000  TROPONINI 1.12* 1.15* 1.12*  BNP: Invalid input(s): POCBNP D-Dimer: No results for input(s): DDIMER in the last 72 hours. Hemoglobin A1C: No results for input(s): HGBA1C in the last 72 hours. Fasting Lipid Panel: No results for input(s): CHOL, HDL, LDLCALC, TRIG, CHOLHDL, LDLDIRECT in the last 72 hours. Thyroid Function Tests: No results for input(s): TSH, T4TOTAL, T3FREE, THYROIDAB in the last 72 hours.  Invalid input(s): FREET3 Anemia Panel: No results for input(s): VITAMINB12, FOLATE, FERRITIN, TIBC, IRON, RETICCTPCT in the last 72 hours.  RADIOLOGY: Dg Chest 2 View  10/23/2015  CLINICAL DATA:  CHF.  Weakness. EXAM: CHEST  2 VIEW COMPARISON:  10/21/2015 FINDINGS: Multiple interstitial densities in the lower chest are suggestive for interstitial edema, right side greater than left. Heart size is upper limits of normal. Trachea is midline. Negative for a pneumothorax. Evidence for a small right pleural effusion. IMPRESSION: Interstitial densities in the lower chest, right side greater than left. Findings are suggestive for interstitial pulmonary edema. Difficult to exclude some airspace disease in the right lower lung. Evidence for small right pleural effusion. Electronically Signed   By: Markus Daft M.D.   On: 10/23/2015 08:27   Dg Chest Port 1 View  11/03/2015  CLINICAL DATA:  Status post CABG on October 27, 2015 persistent left lower lobe atelectasis or pneumonia. No pneumothorax or significant pleural effusion. EXAM: PORTABLE CHEST 1 VIEW COMPARISON:  Portable chest x-ray of November 02, 2015 at 5:48 a.m. FINDINGS: There has been interval placement of an intra aortic balloon pump whose marker lies over the junction of the aortic arch with the proximal descending thoracic aorta. The cardiac silhouette remains enlarged. The pulmonary vascularity remains mildly prominent. The  retrocardiac region on the left remains dense. Bilateral chest tubes are in stable position. There is no pneumothorax nor significant pleural effusion. The endotracheal tube tip lies approximately 6.5 cm above the carina. The esophagogastric tube tip projects below the inferior margin of the image. The right-sided PICC line tip projects over the midportion of the SVC. A previously demonstrated pericardial drain is not clearly evident on today's study. IMPRESSION: Mild CHF. Persistent left lower lobe atelectasis or pneumonia. Interval placement of an intra aortic balloon pump which appears to be in appropriate position radiographically. The other support tubes and lines are in reasonable position. Electronically Signed   By: David  Martinique M.D.   On: 11/03/2015 07:27   Dg Chest Port 1 View  11/02/2015  CLINICAL DATA:  CABG. EXAM: PORTABLE CHEST 1 VIEW COMPARISON:  11/01/2015. FINDINGS: Endotracheal tube, NG tube, right PICC line, bilateral chest tubes in stable position. Prior CABG. Stable cardiomegaly . Persistent mild interstitial prominence, congestive heart failure cannot be excluded. No interim change. Low lung volumes with basilar atelectasis . IMPRESSION: 1. Endotracheal tube, NG tube, right PICC line, bilateral chest tubes in stable position . No pneumothorax. 2. Prior CABG. Cardiomegaly with pulmonary interstitial prominence and small left pleural effusion consistent with mild congestive heart failure. No interim change from prior exam . Electronically Signed   By: Marcello Moores  Register   On: 11/02/2015 07:20   Dg Chest Port 1 View  11/01/2015  CLINICAL DATA:  Post CABG EXAM: PORTABLE CHEST 1 VIEW COMPARISON:  10/31/2015 FINDINGS: Cardiomediastinal silhouette is stable. Status post CABG. Stable endotracheal and NG tube position. Left chest tube is unchanged in position. Persistent central vascular congestion and mild interstitial prominence bilaterally suspicious for the mild interstitial edema. Left IJ  sheath in place. Probable small left pleural effusion left basilar atelectasis or infiltrate. Right arm PICC  line is unchanged in position. There is no pneumothorax. IMPRESSION: Status post CABG. Stable support apparatus. Stable left chest tube position. No pneumothorax. Again noted bilateral mild interstitial prominence and perihilar opacities suspicious for mild pulmonary edema. Probable small left pleural effusion left basilar atelectasis or infiltrate. Electronically Signed   By: Lahoma Crocker M.D.   On: 11/01/2015 09:57   Dg Chest Port 1 View  10/31/2015  CLINICAL DATA:  55 year old male with a history of endotracheal tube placement Patient has undergone coronary artery bypass grafting x4 10/27/2015. Placement of intra aortic balloon pump at this date. Ischemic cardiomyopathy. EXAM: PORTABLE CHEST 1 VIEW COMPARISON:  10/30/2015. FINDINGS: Endotracheal tube terminates approximately 3.4 cm above the carina, unchanged. Gastric tube terminates out of the field of view. Defibrillator pads project over the left and right chest. Bilateral thoracostomy tubes. Right upper extremity PICC. The mediastinal drain not visualized. Intra-aortic balloon pump not visualized. Surgical changes of median sternotomy and CABG. Mixed bilateral interstitial and airspace opacities. Low lung volumes. IMPRESSION: Low lung volumes with mixed bilateral interstitial and airspace opacities, similar to the comparison chest x-ray. Retrocardiac opacity may reflect persistent pleural fluid, and/or atelectasis. Surgical support apparatus appear unchanged, as above. Intra aortic balloon pump not visualized. Signed, Dulcy Fanny. Earleen Newport, DO [facility] Electronically Signed   By: Corrie Mckusick D.O.   On: 10/31/2015 07:25   Dg Chest Port 1 View  10/30/2015  CLINICAL DATA:  Post cavity.  Recent CABG. EXAM: PORTABLE CHEST 1 VIEW COMPARISON:  Portable film earlier today. FINDINGS: Overlying  support apparatus redemonstrated. Tubes and lines remain stable. Elevated LEFT hemidiaphragm. No definite pneumothorax. Moderate vascular congestion may be increased. Cardiomegaly. IMPRESSION: Slight worsening aeration. Moderate vascular congestion may be increased. Electronically Signed   By: Staci Righter M.D.   On: 10/30/2015 18:39   Dg Chest Port 1 View  10/30/2015  CLINICAL DATA:  Status post coronary bypass grafting EXAM: PORTABLE CHEST 1 VIEW COMPARISON:  10/30/2015 FINDINGS: Cardiac shadow remains enlarged. Bilateral chest tubes are again seen. A right-sided PICC line and endotracheal tube are noted in satisfactory position. Left jugular sheath is noted. Elevation of left hemidiaphragm is noted with left basilar atelectasis. No pneumothorax is noted. IMPRESSION: Postsurgical change with tubes and lines as described. Mild left basilar atelectasis. Electronically Signed   By: Inez Catalina M.D.   On: 10/30/2015 07:45   Dg Chest Port 1 View  10/30/2015  CLINICAL DATA:  A chest tube in place EXAM: PORTABLE CHEST 1 VIEW COMPARISON:  10/30/2015 FINDINGS: Postoperative changes in the mediastinum. Endotracheal tube with tip measuring 6.3 cm about the carina. Right PICC catheter with tip over the cavoatrial junction. Left central venous catheter sheath with tip over the left neck consistent location in the internal jugular vein. Bilateral chest tubes. Infiltration or atelectasis in the right lung base. Cardiac enlargement. No significant vascular congestion. No pneumothorax. IMPRESSION: Appliance positioned as described. Cardiac enlargement with infiltration or atelectasis in the right lung base. Bilateral chest tubes are present but no visualized pneumothorax. Electronically Signed   By: Lucienne Capers M.D.   On: 10/30/2015 03:07   Dg Chest Port 1 View  10/30/2015  CLINICAL DATA:  Shortness of breath.  Code blue. EXAM: PORTABLE CHEST 1 VIEW COMPARISON:  10/29/2015 FINDINGS: Postoperative changes in the  mediastinum. Bilateral chest tubes are present. Right PICC line with tip over the cavoatrial junction. Left central venous catheter or sheath with tip over the left side of the neck,  likely in the left internal jugular vein. Cardiac enlargement without significant vascular congestion. Shallow inspiration with elevation of the left hemidiaphragm. Probable left pleural effusion. No definite pulmonary consolidation. IMPRESSION: Shallow inspiration. Probable left pleural effusion. Cardiac enlargement. Appliances appear in satisfactory position. Electronically Signed   By: Lucienne Capers M.D.   On: 10/30/2015 01:18   Dg Chest Port 1 View  10/29/2015  CLINICAL DATA:  Postop CABG 2 days ago. EXAM: PORTABLE CHEST 1 VIEW COMPARISON:  Portable chest x-ray of October 28, 2015 FINDINGS: The trachea and esophagus have been extubated. The cardiac silhouette remains enlarged. The pulmonary vascularity is mildly engorged. The bilateral chest tubes and the mediastinal drain are in stable position. There is no pneumothorax or large pleural effusion. The Swan-Ganz catheter tip overlies a proximal right lower lobe pulmonary artery branch. The mediastinum is widened and accentuated by the hypo inflation. IMPRESSION: Bilateral hypoinflation following extubation of the trachea there is crowding of the pulmonary vascularity. Left basilar atelectasis or less likely pneumonia is more prominent today. Probable small left pleural effusion. The remaining support tubes and lines are in stable position. Electronically Signed   By: David  Martinique M.D.   On: 10/29/2015 07:40   Dg Chest Port 1 View  10/28/2015  CLINICAL DATA:  Post CABG, on ventilator, followup portable chest x-ray of 10/28/2015 EXAM: PORTABLE CHEST 1 VIEW COMPARISON:  None. FINDINGS: The tip of the endotracheal tube is approximately 5.2 cm above the carina. Aeration has improved slightly. Atelectasis remains at the lung bases left-greater-than-right. Swan-Ganz catheter tip is  in the right lower lobe pulmonary artery and bilateral chest tubes remain. No pneumothorax is seen. Heart size is stable. IMPRESSION: 1. Improved aeration. 2. Bilateral chest tubes.  No pneumothorax. 3. Tip of endotracheal tube approximately 5.2 cm above the carina. Electronically Signed   By: Ivar Drape M.D.   On: 10/28/2015 08:01   Dg Chest Port 1 View  10/27/2015  CLINICAL DATA:  Status post CABG EXAM: PORTABLE CHEST 1 VIEW COMPARISON:  10/27/2015 FINDINGS: Unchanged tracheostomy tube. Swan-Ganz central venous line is now seen with tip advanced into the right perihilar region. Stable right chest tube with and left chest tube. NG tube again crosses the gastroesophageal junction. Stable mediastinal drain. Enlarged cardiac silhouette. Limited inspiratory effect with bibasilar opacities likely representing atelectasis. IMPRESSION: Anticipated postoperative appearance with bibasilar atelectasis. Electronically Signed   By: Skipper Cliche M.D.   On: 10/27/2015 19:25   Dg Chest Portable 1 View  10/27/2015  CLINICAL DATA:  Status post coronary bypass grafting EXAM: PORTABLE CHEST 1 VIEW COMPARISON:  10/23/2015 FINDINGS: Cardiac shadow is mildly enlarged. A Swan-Ganz catheter is noted in the right pulmonary outflow tract. Bilateral thoracostomy catheters are seen. No pneumothorax is noted. An endotracheal tube is noted in satisfactory position. Intra-aortic balloon pump is noted over the aortic arch. Mild left basilar atelectasis is seen. Mild central vascular congestion is noted. IMPRESSION: Postoperative changes with tubes and lines as described above. Mild vascular congestion and left basilar atelectasis. Electronically Signed   By: Inez Catalina M.D.   On: 10/27/2015 17:23   Portable Chest X-ray 1 View  10/21/2015  CLINICAL DATA:  55 year old male with a history of acute systolic congestive heart failure EXAM: PORTABLE CHEST 1 VIEW COMPARISON:  10/20/2015 FINDINGS: Heart size unchanged, enlarged. Similar  appearance of low lung volumes with interstitial opacities, interlobular septal thickening, and developing airspace disease of the bilateral lower lungs. No pneumothorax. Linear opacity overlying the mediastinum and the heart border,  of on certain significance. Favored to overlie the patient. IMPRESSION: Evidence of worsening congestive heart failure, with increasing edema. New linear opacity overlies the mediastinum, favored to be overlying the patient, though cannot be localized on this view. Signed, Dulcy Fanny. Earleen Newport, DO [facility] Electronically Signed   By: Corrie Mckusick D.O.   On: 10/21/2015 07:16   Dg Chest Portable 1 View  10/20/2015  CLINICAL DATA:  Shortness of breath. EXAM: PORTABLE CHEST 1 VIEW COMPARISON:  None. FINDINGS: Midline trachea. Cardiomegaly accentuated by AP portable technique. No pleural effusion or pneumothorax. Mildly low lung volumes with bibasilar atelectasis. IMPRESSION: Mild cardiomegaly and low lung volumes.  No acute findings. Electronically Signed   By: Abigail Miyamoto M.D.   On: 10/20/2015 18:09   Dg Abd Portable 1v  11/02/2015  CLINICAL DATA:  Assess feeding tube positioning EXAM: PORTABLE ABDOMEN - 1 VIEW COMPARISON:  None in PACs FINDINGS: The repeated KUB with has less motion artifact. The tip of the feeding tube and nasogastric tube lie in the region of the distal gastric body in the pre-pyloric region. The bowel gas pattern is within the limits of normal. Numerous tubes and lines and electrodes overlie the abdomen. IMPRESSION: The tips of the feeding tube and the esophagogastric tube lie in the region of the distal gastric body in the pre-pyloric region. Electronically Signed   By: David  Martinique M.D.   On: 11/02/2015 12:16    PHYSICAL EXAM General: Awake on vent Neck: JVP 12+ cm, no thyromegaly or thyroid nodule.  Lungs: Decreased breath sounds at bases bilaterally.  CV: Nondisplaced PMI.  Heart regular  S1/S2, no S3/S4, no murmur.  2+ edema to knee on right (vein harvest leg), 1+ on left.  Abdomen: Soft, nontender, no hepatosplenomegaly, no distention.  Neurologic: Alert and follows commands.  Psych: Normal affect. Extremities: No clubbing or cyanosis.   TELEMETRY: Reviewed telemetry, v-pacing.  Short NSVT runs last night.   ASSESSMENT AND PLAN:  1. CAD: S/p late presentation inferior MI and CABG x 4. Continue statin, ASA. Coronary angiography 3/27 with multiple VT episodes showed patent LIMA-LAD and SVG-RCA.  Patent SVG-ramus and SVG-D but slow flow in both grafts with diffusely diseased small target vessels.  2. Cardiogenic shock: Acute systolic CHF with RV failure post inferior MI with suspected RV involvement. EF 25-30% on echo (difficult images on 3/24 echo). He has significant RV failure from suspected RV infarct.  3/27 IABP placed to try to allow decrease in milrinone with frequent VT.  Co-ox this morning 64%, improved.  He remains severely volume overloaded with CVP 19.  - Decrease milrinone to 0.25.  Continue IABP 1:1.  - He is back on NO at 10 ppm after decompensation in setting of RV failure.  - Continue Lasix gtt and add metolazone 2.5 x 1 again today.  Replace K aggressively, better this morning.  3. Complete heart block:  Underlying CHB still.   Atrial rhythm actually appears to be an atypical atrial flutter.  He is pacing at 90 bpm. Suspect he will need permanent pacing if he recovers, would use BiV device.   4. VF arrest 3/24 early am and again at night 3/24. VT overnight 3/26 and during the day 3/27.  Remains on amiodarone gtt and lidocaine gtt added intially, now on procainamide.  K now better and on lower milrinone dose.  - As above, will decrease milrinone to 0.25 today.  - K is better now, watch carefully throughout day  and replete aggressively with diuresis.  - Continue lidocaine and amiodarone, stop procainamide today (can restart if needed).  Pending lidocaine  level.  - If recovers, will need CRT-D.  5. AKI: Creatinine relatively stable.  Follow closely.  Got minimal contrast (30 cc) with cath on 3/27.   45 minutes critical care time.   Loralie Champagne 11/03/2015 7:42 AM

## 2015-11-03 NOTE — Progress Notes (Signed)
Patient ID: Christopher [name], male   DOB: 08-29-1960, 55 y.o.   MRN: [id number]  SICU Evening Rounds:  Hemodynamically stable on Milrinone 0.25, IABP, NO AV paced 90 No arrhythmias today on amio, lido, and Procan.  CVP 20  Urine output good on lasix drip. BMET    Component Value Date/Time   NA 134* 11/03/2015 1621   K 4.3 11/03/2015 1621   CL 90* 11/03/2015 1621   CO2 31 11/03/2015 1154   GLUCOSE 121* 11/03/2015 1621   BUN 49* 11/03/2015 1621   CREATININE 2.30* 11/03/2015 1621   CALCIUM 8.1* 11/03/2015 1154   GFRNONAA 31* 11/03/2015 1154   GFRAA 36* 11/03/2015 1154    CBC    Component Value Date/Time   WBC 9.9 11/03/2015 0345   RBC 2.92* 11/03/2015 0345   HGB 9.5* 11/03/2015 1621   HCT 28.0* 11/03/2015 1621   PLT 271 11/03/2015 0345   MCV 91.4 11/03/2015 0345   MCH 30.5 11/03/2015 0345   MCHC 33.3 11/03/2015 0345   RDW 15.9* 11/03/2015 0345      Sedated on vent  Plan to wean NO overnight

## 2015-11-03 NOTE — Consult Note (Signed)
PULMONARY / CRITICAL CARE MEDICINE   Name: Christopher [name] MRN: [id number] DOB: [birth year]    ADMISSION DATE:  10/20/2015 CONSULTATION DATE:  11/03/2015  REFERRING MD: Dr. Donata Clay / CVTS  CHIEF COMPLAINT:  *Acute Hypoxic Respiratory Failure  HISTORY OF PRESENT ILLNESS:   54 y.o. Male w/ ischemic cardiomyopathy following MI. Patient underwent CABG & CEA. Post-operative course complicated by arrhythmia and biventricular failure. Continues to have IABP in place. Requiring NO inhaled on ventilator. Continuing on Primacor and diuretics. Patient underwent remove of chest tubes today and replacement of IABP.   PAST MEDICAL HISTORY :  He  has a past medical history of Hypertension; TIA (transient ischemic attack); Stenosis of right carotid artery; and Complete heart block (HCC) (10/20/2015).  PAST SURGICAL HISTORY: He  has past surgical history that includes arm surgery (Right, 2003); Eye surgery; Laceration repair; Cardiac catheterization (N/A, 10/20/2015); Coronary artery bypass graft (N/A, 10/27/2015); TEE without cardioversion (N/A, 10/27/2015); Endarterectomy (Right, 10/27/2015); and Cardiac catheterization (N/A, 11/02/2015).  Allergies  Allergen Reactions  . Losartan Shortness Of Breath  . Lisinopril Rash    No current facility-administered medications on file prior to encounter.   Current Outpatient Prescriptions on File Prior to Encounter  Medication Sig  . aspirin 325 MG tablet Take 325 mg by mouth daily.  Marland Kitchen lisinopril (PRINIVIL,ZESTRIL) 20 MG tablet Take 1 tablet (20 mg total) by mouth daily.  Marland Kitchen lovastatin (MEVACOR) 20 MG tablet Take 1 tablet (20 mg total) by mouth at bedtime.    FAMILY HISTORY:  His indicated that his mother is alive. He indicated that his father is deceased. He indicated that his sister is alive. He indicated that both of his brothers are alive.   SOCIAL HISTORY: He  reports that he quit smoking about 13 years ago. He has never used smokeless tobacco. He  reports that he uses illicit drugs (Marijuana). He reports that he does not drink alcohol.  REVIEW OF SYSTEMS:    SUBJECTIVE:   VITAL SIGNS: BP 115/61 mmHg  Pulse 89  Temp(Src) 97.8 F (36.6 C) (Axillary)  Resp 12  Ht 5\' 10"  (1.778 m)  Wt 111.993 kg (246 lb 14.4 oz)  BMI 35.43 kg/m2  SpO2 94%  HEMODYNAMICS: CVP:  [19 mmHg-23 mmHg] 21 mmHg  VENTILATOR SETTINGS: Vent Mode:  [-] PRVC FiO2 (%):  [40 %-50 %] 50 % Set Rate:  [12 bmp] 12 bmp Vt Set:  [580 mL] 580 mL PEEP:  [5 cmH20] 5 cmH20 Pressure Support:  [0 cmH20] 0 cmH20 Plateau Pressure:  [11 cmH20-23 cmH20] 18 cmH20  INTAKE / OUTPUT: I/O last 3 completed shifts: In: 6512.5 [I.V.:4129.2; NG/GT:833.3; IV Piggyback:1550] Out: 7490 [Urine:5310; Emesis/NG output:1900; Chest Tube:280]  PHYSICAL EXAMINATION: General:  Comfortable. No distress. Girlfriend at bedside. Neuro:  Follows commands. Head nods. Grossly nonfocal. HEENT:  ETT in place. No scleral icterus. Cardiovascular:  Regular rate & rhythm. No appreciable JVD. Lungs:  Coarse BS bilaterally. Symmetric chest rise on ventilator. Abdomen:  Soft. Protuberant. Hypoactive BS. Integument:  Warm & dry. No rash on exposed skin.  LABS:  BMET  Recent Labs Lab 11/02/15 0020  11/02/15 1535  11/03/15 0012 11/03/15 0345 11/03/15 0739  NA 132*  < > 134*  < > 133* 135 133*  K 3.3*  < > 3.5  < > 4.0 3.9 4.1  CL 90*  < > 91*  < > 87* 92* 89*  CO2 28  --  30  --   --  32  --  BUN 51*  < > 48*  < > 47* 51* 50*  CREATININE 1.85*  < > 1.85*  < > 2.10* 2.13* 2.20*  GLUCOSE 120*  < > 127*  < > 161* 146* 140*  < > = values in this interval not displayed.  Electrolytes  Recent Labs Lab 11/02/15 0020 11/02/15 0802 11/02/15 1535 11/03/15 0345  CALCIUM 8.2*  --  8.2* 8.1*  MG 2.5* 2.6*  --  2.5*    CBC  Recent Labs Lab 11/01/15 0400 11/02/15 0020  11/03/15 0012 11/03/15 0345 11/03/15 0739  WBC 11.4* 10.0  --   --  9.9  --   HGB 8.7* 8.7*  < > 10.2* 8.9* 9.5*   HCT 27.0* 26.8*  < > 30.0* 26.7* 28.0*  PLT 213 203  --   --  271  --   < > = values in this interval not displayed.  Coag's  Recent Labs Lab 10/27/15 1750 10/29/15 0313  APTT 27 37  INR 1.59* 1.82*    Sepsis Markers No results for input(s): LATICACIDVEN, PROCALCITON, O2SATVEN in the last 168 hours.  ABG  Recent Labs Lab 11/02/15 0837 11/02/15 1354 11/03/15 0356  PHART 7.461* 7.474* 7.430  PCO2ART 47.2* 45.3* 54.6*  PO2ART 86.0 220.0* 83.0    Liver Enzymes  Recent Labs Lab 11/01/15 0400 11/02/15 0020 11/03/15 0345  AST 160* 144* 101*  ALT 148* 156* 139*  ALKPHOS 67 70 73  BILITOT 1.3* 1.6* 1.3*  ALBUMIN 2.7* 2.6* 2.4*    Cardiac Enzymes  Recent Labs Lab 11/02/15 0748 11/02/15 1535 11/02/15 2000  TROPONINI 1.12* 1.15* 1.12*    Glucose  Recent Labs Lab 11/02/15 0401 11/02/15 0744 11/02/15 2009 11/03/15 0009 11/03/15 0345 11/03/15 0714  GLUCAP 124* 124* 143* 146* 127* 133*    Imaging Dg Chest Port 1 View  11/03/2015  CLINICAL DATA:  Central line placement. EXAM: PORTABLE CHEST 1 VIEW COMPARISON:  11/03/2015. FINDINGS: Left subclavian central line noted with tip at the cavoatrial junction. Interval removal of right chest tube. Endotracheal tube, NG tube, right PICC line, left IJ sheath left chest tube in stable position. No pneumothorax. Prior CABG. Stable cardiomegaly. Low lung volumes. A mild component of congestive heart failure cannot be excluded. Similar findings noted on prior exam P IMPRESSION: 1. Interim placement of left subclavian central line, its tip is at the cavoatrial junction. Interim removal right chest tube . No pneumothorax. 2. Remaining lines and tubes including left chest tube in stable position. 3. Prior CABG. Stable cardiomegaly. Low lung volumes. A a mild component congestive heart failure cannot be excluded . Similar findings noted on prior exam. Electronically Signed   By: Maisie Fus  Register   On: 11/03/2015 09:58   Dg Chest  Port 1 View  11/03/2015  CLINICAL DATA:  Status post CABG on October 27, 2015 persistent left lower lobe atelectasis or pneumonia. No pneumothorax or significant pleural effusion. EXAM: PORTABLE CHEST 1 VIEW COMPARISON:  Portable chest x-ray of November 02, 2015 at 5:48 a.m. FINDINGS: There has been interval placement of an intra aortic balloon pump whose marker lies over the junction of the aortic arch with the proximal descending thoracic aorta. The cardiac silhouette remains enlarged. The pulmonary vascularity remains mildly prominent. The retrocardiac region on the left remains dense. Bilateral chest tubes are in stable position. There is no pneumothorax nor significant pleural effusion. The endotracheal tube tip lies approximately 6.5 cm above the carina. The esophagogastric tube tip projects below the inferior margin of  the image. The right-sided PICC line tip projects over the midportion of the SVC. A previously demonstrated pericardial drain is not clearly evident on today's study. IMPRESSION: Mild CHF. Persistent left lower lobe atelectasis or pneumonia. Interval placement of an intra aortic balloon pump which appears to be in appropriate position radiographically. The other support tubes and lines are in reasonable position. Electronically Signed   By: David  [place] M.D.   On: 11/03/2015 07:27   Dg Abd Portable 1v  11/02/2015  CLINICAL DATA:  Assess feeding tube positioning EXAM: PORTABLE ABDOMEN - 1 VIEW COMPARISON:  None in PACs FINDINGS: The repeated KUB with has less motion artifact. The tip of the feeding tube and nasogastric tube lie in the region of the distal gastric body in the pre-pyloric region. The bowel gas pattern is within the limits of normal. Numerous tubes and lines and electrodes overlie the abdomen. IMPRESSION: The tips of the feeding tube and the esophagogastric tube lie in the region of the distal gastric body in the pre-pyloric region. Electronically Signed   By: David  [place] M.D.    On: 11/02/2015 12:16     STUDIES:  Port CXR 3/28:  Personally reviewed by me. R PICC in good psition. ETT >5cm above carina. New L [place] CVL. L lung opacity unchanged. Low lung volumes. L chest tube in place.  MICROBIOLOGY: Tracheal Asp Ctx 3/27>>> Urine Ctx 3/24:  Negative MRSA PCR 3/20:  Negative MRSA PCR 3/14:  Negative  ANTIBIOTICS: Fortaz 3/27>>> Cefuroxime 3/21 - 3/23 (periop prophylaxis) Vancomcyin 3/21 - 3/22  SIGNIFICANT EVENTS: 3/14 - Admit 3/14 - LHC 3/21 - CABG 3/27 - IABP replaced 3/28 - Chest Tubes removed. RHC & LHC.  LINES/TUBES: OETT 7.5 Extubated 3/22; 3.24>>> L Chest Tube>>> L [place] CVL 3/28>>> L IJ CVL 3/21>>> R PICC 3/23>>> IABP 3/27>>> R Femoral Art Sheath 3/27>>> L Radial Art Line 3/21>>> NGT R Nare 3/27>>> OGT 3/24>>> Foley 3/22>>>  ASSESSMENT / PLAN:  PULMONARY A: Acute Hypoxic Respiratory Failure L Chest Tube  P:   Full Vent Support Advance ETT 2cm Continue Xopenex q6hr NO Inh  D/C Dulera   CARDIOVASCULAR A:  S/P CABG & CEA BiVentricular Heart Failure post MI Cardiogenic Shock Periop Complete Heart Block & Post-op V Tach H/O HTN  P:  CVTS & Cardiology Managing Amiodarone gtt Primacor gtt Lasix gtt Heparin gtt Levophed gtt to maintain MAP IABP  RENAL A:   Acute Renal Failure  P:   Trending UOP with Foley Electrolytes & Renal Function daily Replacing electrolytes as indicated  GASTROINTESTINAL A:   Transaminitis - Likely shock liver/hepatic congestion. Constipation  P:   Trending LFTs daily Continuing TFs Protonix IV daily Dulcolax PR prn Start Senna bid  HEMATOLOGIC A:   Anemia - Hgb Stable. No signs of active bleeding.  P:  Heparin gtt Trending cell counts daily w/ CBC  INFECTIOUS A:   Possible HCAP  P:   Fortaz Day #2 Empiric coverage Awaiting Trach Asp Ctx Checking procalcitonin per algorithm  ENDOCRINE A:   Hyperglycemia - BG controlled.  P:   Continue Levemir bid Accu-checks  q4hr SSI per algorithm  NEUROLOGIC A:   Sedation on Ventilator Post-op Pain Control   P:   RASS goal: 0 to -1 Versed gtt Fentanyl gtt D/C Oxycodone prn D/C Morphine IV prn  FAMILY UPDATES:   TODAY'S SUMMARY:  55 year old s/p CABG & CEA. Continuing in cardiogenic shock. On inhaled NO. Patient continuing on IABP. Cardiogenic shock is main limitation to  extubation. Checking further studies for infectious workup.   I have spent a total of 43 minutes of critical care time today caring for the patient and reviewing the patient's electronic medical record.   Donna Christen Jamison Neighbor, M.D. [facility] Pager:  [phone] After 3pm or if no response, call [phone] 11/03/2015, 10:47 AM

## 2015-11-03 NOTE — Progress Notes (Signed)
ANTICOAGULATION CONSULT NOTE - Follow Up Consult  Pharmacy Consult for heparin Indication: IABP   Labs:  Recent Labs  11/01/15 0400  11/02/15 0020 11/02/15 0748  11/02/15 1535  11/02/15 1838 11/02/15 2000 11/03/15 0012 11/03/15 0345  HGB 8.7*  --  8.7*  --   < >  --   < > 9.9*  --  10.2* 8.9*  HCT 27.0*  --  26.8*  --   < >  --   < > 29.0*  --  30.0* 26.7*  PLT 213  --  203  --   --   --   --   --   --   --  271  HEPARINUNFRC  --   --   --   --   --   --   --   --   --   --  0.14*  CREATININE 2.02*  < > 1.85*  --   < > 1.85*  < > 2.00*  --  2.10* 2.13*  TROPONINI  --   --   --  1.12*  --  1.15*  --   --  1.12*  --   --   < > = values in this interval not displayed.    Assessment: 55yo male subtherapeutic on heparin with initial dosing for IABP.  Goal of Therapy:  Heparin level 0.2-0.5 units/ml   Plan:  Will increase heparin gtt slightly to 1200 units/hr and check level in 6hr.  Vernard Gambles, PharmD, BCPS  11/03/2015,5:04 AM

## 2015-11-03 NOTE — Progress Notes (Signed)
1 Day Post-Op Procedure(s) (LRB): Right/Left Heart Cath and Coronary/Graft Angiography (N/A) Intra-Aortic Balloon Pump Insertion Subjective: Ischemic cardiomyopathy following presentation with completed MI from proximal occlusion of a hyperdominant RCA-biventricular failure Preoperative heart block Status post CABG with combined carotid endarterectomy and placement of balloon pump for separation from cardio point bypass Postoperative RV dysfunction requiring prolonged milrinone Postoperative ventricular arrhythmias from scar mediated VT Postop left and right heart cath demonstrating patent grafts and elevated CVP, pulmonary Wedge pressure  Patient had stable night without requiring cardioversion Hemodynamics improved on balloon pump with co-OX greater than 60% Diuresing well with Lasix drip Chest x-ray clearing Chest tube drainage decreasing-we'll remove 2 chest tubes Patient currently on heparin drip for balloon pump-maximum dose 1200 units per hour Feeding tube placed and vital 1.5 started-Panda will be positioned postpyloric by interventional radiology  Objective: Vital signs in last 24 hours: Temp:  [97.8 F (36.6 C)-98.3 F (36.8 C)] 97.8 F (36.6 C) (03/28 0719) Pulse Rate:  [87-135] 88 (03/28 0830) Cardiac Rhythm:  [-] A-V Sequential paced (03/28 0800) Resp:  [5-39] 11 (03/28 0830) BP: (101-138)/(53-89) 115/61 mmHg (03/28 0824) SpO2:  [92 %-100 %] 94 % (03/28 0838) Arterial Line BP: (112-165)/(53-90) 112/58 mmHg (03/28 0830) FiO2 (%):  [40 %-50 %] 50 % (03/28 0838) Weight:  [246 lb 14.4 oz (111.993 kg)] 246 lb 14.4 oz (111.993 kg) (03/28 0500)  Hemodynamic parameters for last 24 hours: CVP:  [19 mmHg-23 mmHg] 21 mmHg  Intake/Output from previous day: 03/27 0701 - 03/28 0700 In: 4714.8 [I.V.:2881.5; NG/GT:833.3; IV Piggyback:1000] Out: 4720 [Urine:3510; Emesis/NG output:1000; Chest Tube:210] Intake/Output this shift: Total I/O In: 258.2 [I.V.:108.2; NG/GT:100; IV  Piggyback:50] Out: 175 [Urine:175]       Exam    General- alert and comfortable, sedated on ventilator   Lungs- clear without rales, wheezes, reduced breath sounds on left   Cor- A-V sequentially paced rate, no murmur , no gallop   Abdomen- soft, non-tender   Extremities - warm, non-tender, 2+ edema   Neuro- oriented, appropriate, no focal weakness   Lab Results:  Recent Labs  11/02/15 0020  11/03/15 0345 11/03/15 0739  WBC 10.0  --  9.9  --   HGB 8.7*  < > 8.9* 9.5*  HCT 26.8*  < > 26.7* 28.0*  PLT 203  --  271  --   < > = values in this interval not displayed. BMET:  Recent Labs  11/02/15 1535  11/03/15 0345 11/03/15 0739  NA 134*  < > 135 133*  K 3.5  < > 3.9 4.1  CL 91*  < > 92* 89*  CO2 30  --  32  --   GLUCOSE 127*  < > 146* 140*  BUN 48*  < > 51* 50*  CREATININE 1.85*  < > 2.13* 2.20*  CALCIUM 8.2*  --  8.1*  --   < > = values in this interval not displayed.  PT/INR: No results for input(s): LABPROT, INR in the last 72 hours. ABG    Component Value Date/Time   PHART 7.430 11/03/2015 0356   HCO3 36.3* 11/03/2015 0356   TCO2 34 11/03/2015 0739   ACIDBASEDEF 2.0 10/30/2015 1802   O2SAT 63.6 11/03/2015 0412   CBG (last 3)   Recent Labs  11/03/15 0009 11/03/15 0345 11/03/15 0714  GLUCAP 146* 127* 133*    Assessment/Plan: S/P Procedure(s) (LRB): Right/Left Heart Cath and Coronary/Graft Angiography (N/A) Intra-Aortic Balloon Pump Insertion Biventricular dysfunction from preop infarct and proximal occlusion of a  hyperdominant RCA Postoperative ventricular arrhythmias Intubated because of multiple DC cardioversion shocks required  Continue balloon pump support with pressors per  Dr. Shirlee Latch, advanced heart failure team We'll ask CCM to help manage and wean ventilator Continue aggressive Lasix with goal of reducing CVP 10-15 Continue Fortaz for purulent tracheal secretions and infiltrate on chest x-ray with moderately elevated white count  LOS: 14  days    Kathlee Nations Trigt III 11/03/2015

## 2015-11-03 NOTE — Progress Notes (Signed)
ANTICOAGULATION CONSULT NOTE - Follow Up Consult  Pharmacy Consult for Heparin Indication: IABP  Allergies  Allergen Reactions  . Losartan Shortness Of Breath  . Lisinopril Rash    Patient Measurements: Height: 5\' 10"  (177.8 cm) Weight: 246 lb 14.4 oz (111.993 kg) IBW/kg (Calculated) : 73 Heparin Dosing Weight: 99kg  Vital Signs: Temp: 97.9 F (36.6 C) (03/28 1515) Temp Source: Axillary (03/28 1515) BP: 131/81 mmHg (03/28 1644) Pulse Rate: 90 (03/28 1644)  Labs:  Recent Labs  11/01/15 0400  11/02/15 0020 11/02/15 0748  11/02/15 1535  11/02/15 2000  11/03/15 0345 11/03/15 0739 11/03/15 1100 11/03/15 1154 11/03/15 1620 11/03/15 1621  HGB 8.7*  --  8.7*  --   < >  --   < >  --   < > 8.9* 9.5*  --   --   --  9.5*  HCT 27.0*  --  26.8*  --   < >  --   < >  --   < > 26.7* 28.0*  --   --   --  28.0*  PLT 213  --  203  --   --   --   --   --   --  271  --   --   --   --   --   HEPARINUNFRC  --   --   --   --   --   --   --   --   --  0.14*  --  0.30  --  0.33  --   CREATININE 2.02*  < > 1.85*  --   < > 1.85*  < >  --   < > 2.13* 2.20*  --  2.25*  --  2.30*  TROPONINI  --   --   --  1.12*  --  1.15*  --  1.12*  --   --   --   --   --   --   --   < > = values in this interval not displayed.  Estimated Creatinine Clearance: 46 mL/min (by C-G formula based on Cr of 2.3).   Medications:  Heparin @ 1200 units/hr  Assessment: 55yom continues on heparin for IABP that was placed yesterday in the cath lab. Heparin level is therapeutic at 0.33. Hb low stable, platelets are normal. No bleeding reported.  Goal of Therapy:  Heparin level 0.2-0.5 units/ml Monitor platelets by anticoagulation protocol: Yes   Plan:  1) Continue heparin at 1200 units/hr 2) Daily HL and CBC  [facility], [address].D., BCPS Clinical Pharmacist Pager: [phone] 11/03/2015 4:58 PM

## 2015-11-03 NOTE — Progress Notes (Signed)
CT surgery procedure note  Left subclavian triple-lumen catheter placed under sterile prep and drape for IV access. Chest x-ray pending  We'll remove left IJ sleeve place at time of surgery.

## 2015-11-04 ENCOUNTER — Inpatient Hospital Stay (HOSPITAL_COMMUNITY): Payer: BLUE CROSS/BLUE SHIELD

## 2015-11-04 DIAGNOSIS — N179 Acute kidney failure, unspecified: Secondary | ICD-10-CM

## 2015-11-04 LAB — POCT I-STAT 3, ART BLOOD GAS (G3+)
ACID-BASE EXCESS: 9 mmol/L — AB (ref 0.0–2.0)
ACID-BASE EXCESS: 9 mmol/L — AB (ref 0.0–2.0)
ACID-BASE EXCESS: 12 mmol/L — AB (ref 0.0–2.0)
BICARBONATE: 34.6 meq/L — AB (ref 20.0–24.0)
BICARBONATE: 34.3 meq/L — AB (ref 20.0–24.0)
Bicarbonate: 38.3 mEq/L — ABNORMAL HIGH (ref 20.0–24.0)
O2 SAT: 93 %
O2 SAT: 94 %
O2 SAT: 100 %
PO2 ART: 72 mmHg — AB (ref 80.0–100.0)
PO2 ART: 192 mmHg — AB (ref 80.0–100.0)
TCO2: 36 mmol/L (ref 0–100)
TCO2: 36 mmol/L (ref 0–100)
TCO2: 40 mmol/L (ref 0–100)
pCO2 arterial: 55.4 mmHg — ABNORMAL HIGH (ref 35.0–45.0)
pCO2 arterial: 52 mmHg — ABNORMAL HIGH (ref 35.0–45.0)
pCO2 arterial: 56.2 mmHg — ABNORMAL HIGH (ref 35.0–45.0)
pH, Arterial: 7.405 (ref 7.350–7.450)
pH, Arterial: 7.427 (ref 7.350–7.450)
pH, Arterial: 7.441 (ref 7.350–7.450)
pO2, Arterial: 68 mmHg — ABNORMAL LOW (ref 80.0–100.0)

## 2015-11-04 LAB — BLOOD GAS, ARTERIAL

## 2015-11-04 LAB — COMPREHENSIVE METABOLIC PANEL
ALT: 117 U/L — ABNORMAL HIGH (ref 17–63)
AST: 85 U/L — ABNORMAL HIGH (ref 15–41)
Albumin: 2.4 g/dL — ABNORMAL LOW (ref 3.5–5.0)
Alkaline Phosphatase: 74 U/L (ref 38–126)
Anion gap: 11 (ref 5–15)
BUN: 55 mg/dL — ABNORMAL HIGH (ref 6–20)
CO2: 31 mmol/L (ref 22–32)
Calcium: 8.1 mg/dL — ABNORMAL LOW (ref 8.9–10.3)
Chloride: 93 mmol/L — ABNORMAL LOW (ref 101–111)
Creatinine, Ser: 2.37 mg/dL — ABNORMAL HIGH (ref 0.61–1.24)
GFR calc Af Amer: 34 mL/min — ABNORMAL LOW (ref >60)
GFR calc non Af Amer: 29 mL/min — ABNORMAL LOW (ref >60)
Glucose, Bld: 157 mg/dL — ABNORMAL HIGH (ref 65–99)
Potassium: 4.4 mmol/L (ref 3.5–5.1)
Sodium: 135 mmol/L (ref 135–145)
Total Bilirubin: 1.1 mg/dL (ref 0.3–1.2)
Total Protein: 5.8 g/dL — ABNORMAL LOW (ref 6.5–8.1)

## 2015-11-04 LAB — POCT I-STAT, CHEM 8
BUN: 53 mg/dL — AB (ref 6–20)
BUN: 58 mg/dL — AB (ref 6–20)
BUN: 62 mg/dL — ABNORMAL HIGH (ref 6–20)
CALCIUM ION: 1.02 mmol/L — AB (ref 1.12–1.23)
CALCIUM ION: 1.03 mmol/L — AB (ref 1.12–1.23)
CALCIUM ION: 1.05 mmol/L — AB (ref 1.12–1.23)
CREATININE: 2.6 mg/dL — AB (ref 0.61–1.24)
CREATININE: 2.6 mg/dL — AB (ref 0.61–1.24)
CREATININE: 2.8 mg/dL — AB (ref 0.61–1.24)
Chloride: 88 mmol/L — ABNORMAL LOW (ref 101–111)
Chloride: 89 mmol/L — ABNORMAL LOW (ref 101–111)
Chloride: 90 mmol/L — ABNORMAL LOW (ref 101–111)
GLUCOSE: 122 mg/dL — AB (ref 65–99)
GLUCOSE: 134 mg/dL — AB (ref 65–99)
GLUCOSE: 156 mg/dL — AB (ref 65–99)
HCT: 28 % — ABNORMAL LOW (ref 39.0–52.0)
HEMATOCRIT: 29 % — AB (ref 39.0–52.0)
HEMATOCRIT: 28 % — AB (ref 39.0–52.0)
HEMOGLOBIN: 9.5 g/dL — AB (ref 13.0–17.0)
HEMOGLOBIN: 9.9 g/dL — AB (ref 13.0–17.0)
Hemoglobin: 9.5 g/dL — ABNORMAL LOW (ref 13.0–17.0)
Potassium: 4.3 mmol/L (ref 3.5–5.1)
Potassium: 4.5 mmol/L (ref 3.5–5.1)
Potassium: 4.6 mmol/L (ref 3.5–5.1)
Sodium: 134 mmol/L — ABNORMAL LOW (ref 135–145)
Sodium: 134 mmol/L — ABNORMAL LOW (ref 135–145)
Sodium: 134 mmol/L — ABNORMAL LOW (ref 135–145)
TCO2: 32 mmol/L (ref 0–100)
TCO2: 34 mmol/L (ref 0–100)
TCO2: 35 mmol/L (ref 0–100)

## 2015-11-04 LAB — LIDOCAINE LEVEL: Lidocaine Lvl: 2.8 ug/mL (ref 1.5–5.0)

## 2015-11-04 LAB — CULTURE, RESPIRATORY W GRAM STAIN
Culture: NORMAL
Special Requests: NORMAL

## 2015-11-04 LAB — BASIC METABOLIC PANEL
ANION GAP: 11 (ref 5–15)
BUN: 58 mg/dL — AB (ref 6–20)
CALCIUM: 8.1 mg/dL — AB (ref 8.9–10.3)
CO2: 33 mmol/L — AB (ref 22–32)
CREATININE: 2.64 mg/dL — AB (ref 0.61–1.24)
Chloride: 92 mmol/L — ABNORMAL LOW (ref 101–111)
GFR calc Af Amer: 30 mL/min — ABNORMAL LOW (ref >60)
GFR, EST NON AFRICAN AMERICAN: 26 mL/min — AB (ref >60)
GLUCOSE: 148 mg/dL — AB (ref 65–99)
Potassium: 4.2 mmol/L (ref 3.5–5.1)
Sodium: 136 mmol/L (ref 135–145)

## 2015-11-04 LAB — GLUCOSE, CAPILLARY
GLUCOSE-CAPILLARY: 130 mg/dL — AB (ref 65–99)
GLUCOSE-CAPILLARY: 157 mg/dL — AB (ref 65–99)
Glucose-Capillary: 144 mg/dL — ABNORMAL HIGH (ref 65–99)
Glucose-Capillary: 135 mg/dL — ABNORMAL HIGH (ref 65–99)
Glucose-Capillary: 122 mg/dL — ABNORMAL HIGH (ref 65–99)

## 2015-11-04 LAB — CARBOXYHEMOGLOBIN
Carboxyhemoglobin: 1.5 % (ref 0.5–1.5)
Methemoglobin: 0.9 % (ref 0.0–1.5)
O2 Saturation: 59.2 %
Total hemoglobin: 9.3 g/dL — ABNORMAL LOW (ref 13.5–18.0)

## 2015-11-04 LAB — CBC
HCT: 27.9 % — ABNORMAL LOW (ref 39.0–52.0)
Hemoglobin: 8.7 g/dL — ABNORMAL LOW (ref 13.0–17.0)
MCH: 28.9 pg (ref 26.0–34.0)
MCHC: 31.2 g/dL (ref 30.0–36.0)
MCV: 92.7 fL (ref 78.0–100.0)
Platelets: 217 10*3/uL (ref 150–400)
RBC: 3.01 MIL/uL — ABNORMAL LOW (ref 4.22–5.81)
RDW: 16 % — ABNORMAL HIGH (ref 11.5–15.5)
WBC: 10.5 10*3/uL (ref 4.0–10.5)

## 2015-11-04 LAB — HEPARIN LEVEL (UNFRACTIONATED): Heparin Unfractionated: 0.25 IU/mL — ABNORMAL LOW (ref 0.30–0.70)

## 2015-11-04 LAB — MAGNESIUM: Magnesium: 2.6 mg/dL — ABNORMAL HIGH (ref 1.7–2.4)

## 2015-11-04 LAB — PROCALCITONIN: Procalcitonin: 1.5 ng/mL

## 2015-11-04 LAB — PHOSPHORUS: Phosphorus: 4.5 mg/dL (ref 2.5–4.6)

## 2015-11-04 MED ORDER — SILVER SULFADIAZINE 1 % EX CREA
TOPICAL_CREAM | Freq: Two times a day (BID) | CUTANEOUS | Status: DC
  Administered 2015-11-04: 15:00:00 via TOPICAL
  Administered 2015-11-04: 1 via TOPICAL
  Administered 2015-11-05 – 2015-11-06 (×4): via TOPICAL
  Administered 2015-11-07 (×2): 1 via TOPICAL
  Administered 2015-11-08 – 2015-11-12 (×10): via TOPICAL
  Administered 2015-11-13 (×2): 1 via TOPICAL
  Administered 2015-11-14: 09:00:00 via TOPICAL
  Administered 2015-11-14 – 2015-11-16 (×4): 1 via TOPICAL
  Administered 2015-11-16 – 2015-11-17 (×4): via TOPICAL
  Administered 2015-11-18 – 2015-11-20 (×4): 1 via TOPICAL
  Administered 2015-11-20 – 2015-11-22 (×5): via TOPICAL
  Administered 2015-11-23: 1 via TOPICAL
  Administered 2015-11-23 – 2015-11-24 (×2): via TOPICAL
  Filled 2015-11-04: qty 85

## 2015-11-04 MED ORDER — LIDOCAINE HCL (CARDIAC) 20 MG/ML IV SOLN: 100.0000 mg | Freq: Once | INTRAVENOUS | Status: DC

## 2015-11-04 MED ORDER — ACETAZOLAMIDE SODIUM 500 MG IJ SOLR
250.0000 mg | Freq: Once | INTRAMUSCULAR | Status: AC
  Administered 2015-11-04: 250 mg via INTRAVENOUS
  Filled 2015-11-04: qty 250

## 2015-11-04 MED ORDER — VITAL HIGH PROTEIN PO LIQD
1000.0000 mL | ORAL | Status: DC
  Administered 2015-11-04 – 2015-11-23 (×17): 1000 mL
  Filled 2015-11-04 (×5): qty 1000

## 2015-11-04 MED ORDER — PIPERACILLIN-TAZOBACTAM 3.375 G IVPB 30 MIN
3.3750 g | Freq: Once | INTRAVENOUS | Status: AC
  Administered 2015-11-05: 3.375 g via INTRAVENOUS
  Filled 2015-11-04: qty 50

## 2015-11-04 MED ORDER — VANCOMYCIN HCL 10 G IV SOLR
1250.0000 mg | INTRAVENOUS | Status: DC
  Administered 2015-11-05: 1250 mg via INTRAVENOUS
  Filled 2015-11-04: qty 1250

## 2015-11-04 MED ORDER — VITAL HIGH PROTEIN PO LIQD: 1000.0000 mL | ORAL | Status: DC

## 2015-11-04 MED ORDER — VANCOMYCIN HCL 10 G IV SOLR
2000.0000 mg | Freq: Once | INTRAVENOUS | Status: AC
  Administered 2015-11-05: 2000 mg via INTRAVENOUS
  Filled 2015-11-04: qty 2000

## 2015-11-04 MED ORDER — PIPERACILLIN-TAZOBACTAM 3.375 G IVPB
3.3750 g | Freq: Three times a day (TID) | INTRAVENOUS | Status: DC
  Administered 2015-11-05 – 2015-11-07 (×8): 3.375 g via INTRAVENOUS
  Filled 2015-11-04 (×10): qty 50

## 2015-11-04 MED ORDER — DEXTROSE 5 % IV SOLN
250.0000 mg | Freq: Once | INTRAVENOUS | Status: DC
  Filled 2015-11-04: qty 250

## 2015-11-04 MED ORDER — PRO-STAT SUGAR FREE PO LIQD
30.0000 mL | Freq: Three times a day (TID) | ORAL | Status: DC
  Administered 2015-11-04 – 2015-11-08 (×14): 30 mL
  Administered 2015-11-09: 10:00:00
  Administered 2015-11-09 – 2015-11-24 (×43): 30 mL
  Filled 2015-11-04 (×61): qty 30

## 2015-11-04 MED ORDER — METOLAZONE 5 MG PO TABS
2.5000 mg | ORAL_TABLET | Freq: Once | ORAL | Status: AC
  Administered 2015-11-04: 2.5 mg via ORAL
  Filled 2015-11-04: qty 1

## 2015-11-04 NOTE — Progress Notes (Signed)
PULMONARY / CRITICAL CARE MEDICINE   Name: Christopher [name] MRN: [id number] DOB: 03-May-1961    ADMISSION DATE:  10/20/2015 CONSULTATION DATE:  11/03/2015  REFERRING MD: Dr. Donata Clay / CVTS  CHIEF COMPLAINT:  Acute Hypoxic Respiratory Failure  HISTORY OF PRESENT ILLNESS:   [name] w/ ischemic cardiomyopathy following MI. Patient underwent CABG & CEA. Post-operative course complicated by arrhythmia and biventricular failure. Continues to have IABP in place. Requiring NO inhaled on ventilator. Continuing on Primacor and diuretics. Patient underwent remove of chest tubes today and replacement of IABP.   SUBJECTIVE: No acute events overnight. Patient is now off inhaled NO.  REVIEW OF SYSTEMS:  Unable to obtain given intubation & sedation.  VITAL SIGNS: BP 148/62 mmHg  Pulse 92  Temp(Src) 99.1 F (37.3 C) (Oral)  Resp 18  Ht  (1.778 m)  Wt 246 lb 11.2 oz (111.902 kg)  BMI 35.40 kg/m2  SpO2 97%  HEMODYNAMICS: CVP:  [16 mmHg-21 mmHg] 18 mmHg  VENTILATOR SETTINGS: Vent Mode:  [-] PRVC FiO2 (%):  [40 %-50 %] 40 % Set Rate:  [12 bmp-112 bmp] 112 bmp Vt Set:  [539 mL-590 mL] 590 mL PEEP:  [5 cmH20] 5 cmH20 Pressure Support:  [0 cmH20] 0 cmH20 Plateau Pressure:  [13 cmH20-27 cmH20] 22 cmH20  INTAKE / OUTPUT: I/O last 3 completed shifts: In: 7051.2 [I.V.:3641.2; NG/GT:2260; IV Piggyback:1150] Out: 6650 [Urine:4630; Emesis/NG output:1900; Chest Tube:120]  PHYSICAL EXAMINATION: General:  Comfortable. No distress. Girlfriend at bedside. Neuro:  Follows commands. Head nods. Moves on command. HEENT:  ETT in place. No scleral icterus or injection. Cardiovascular:  Regular rate & rhythm. No appreciable JVD. Lower extremity edema. Lungs:  Coarse BS bilaterally. Symmetric chest rise on ventilator. Abdomen:  Soft. Protuberant. Increasing BS. Integument:  Warm & dry. No rash on exposed skin.  LABS:  BMET  Recent Labs Lab 11/03/15 0345  11/03/15 1154  11/03/15 2000  11/04/15 0215 11/04/15 0757  NA 135  < > 135  < > 133* 135 134*  K 3.9  < > 4.3  < > 3.8 4.4 4.5  CL 92*  < > 93*  < > 89* 93* 90*  CO2 32  --  31  --   --  31  --   BUN 51*  < > 52*  < > 49* 55* 53*  CREATININE 2.13*  < > 2.25*  < > 2.30* 2.37* 2.60*  GLUCOSE 146*  < > 173*  < > 137* 157* 156*  < > = values in this interval not displayed.  Electrolytes  Recent Labs Lab 11/02/15 0802  11/03/15 0345 11/03/15 1154 11/04/15 0215  CALCIUM  --   < > 8.1* 8.1* 8.1*  MG 2.6*  --  2.5*  --  2.6*  PHOS  --   --   --   --  4.5  < > = values in this interval not displayed.  CBC  Recent Labs Lab 11/02/15 0020  11/03/15 0345  11/03/15 2000 11/04/15 0215 11/04/15 0757  WBC 10.0  --  9.9  --   --  10.5  --   HGB 8.7*  < > 8.9*  < > 9.5* 8.7* 9.9*  HCT 26.8*  < > 26.7*  < > 28.0* 27.9* 29.0*  PLT 203  --  271  --   --  217  --   < > = values in this interval not displayed.  Coag's  Recent Labs Lab 10/29/15 0313  APTT  37  INR 1.82*    Sepsis Markers  Recent Labs Lab 11/03/15 1154 11/04/15 0215  PROCALCITON 1.75 1.50    ABG  Recent Labs Lab 11/02/15 1354 11/03/15 0356 11/04/15 0446  PHART 7.474* 7.430 7.405  PCO2ART 45.3* 54.6* 55.4*  PO2ART 220.0* 83.0 68.0*    Liver Enzymes  Recent Labs Lab 11/02/15 0020 11/03/15 0345 11/04/15 0215  AST 144* 101* 85*  ALT 156* 139* 117*  ALKPHOS 70 73 74  BILITOT 1.6* 1.3* 1.1  ALBUMIN 2.6* 2.4* 2.4*    Cardiac Enzymes  Recent Labs Lab 11/02/15 0748 11/02/15 1535 11/02/15 2000  TROPONINI 1.12* 1.15* 1.12*    Glucose  Recent Labs Lab 11/03/15 0714 11/03/15 1123 11/03/15 1514 11/03/15 1921 11/03/15 2347 11/04/15 0349  GLUCAP 133* 155* 139* 114* 148* 157*    Imaging Dg Abd 1 View  11/03/2015  CLINICAL DATA:  Enteric tube advanced to post pyloric position. EXAM: ABDOMEN - 1 VIEW COMPARISON:  11/02/2015 abdominal radiographs. FINDINGS: Fluoroscopy time 7 minutes. Enteric tube courses through the  stomach and duodenum and appears to terminate near the duodenal jejunal junction. Instilled contrast opacifies nondilated proximal jejunal small bowel loops. No contrast leak is demonstrated. Separate enteric tube terminates in the body of the stomach. Visualized lower sternotomy wires appear aligned and intact. IMPRESSION: Enteric tube appears to terminate near the duodenal jejunal junction. Separate enteric tube terminates in the body of the stomach. Electronically Signed   By: Delbert Phenix M.D.   On: 11/03/2015 17:02   Dg Chest Port 1 View  11/04/2015  CLINICAL DATA:  Status post CABG 10/28/2015. EXAM: PORTABLE CHEST 1 VIEW COMPARISON:  Single view of the chest 11/03/2015 and 11/02/2015. FINDINGS: Support tubes and lines including a left chest tube are unchanged since the most recent exam. The chest is better expanded today with decreased atelectasis. Airspace opacity in the left mid and lower lung zones persists. There is likely a left pleural effusion. Marked cardiomegaly is seen with only mild vascular congestion noted. IMPRESSION: Increased pulmonary expansion with decreased scattered atelectasis. Left mid and lower lung zone airspace disease could be due to atelectasis or pneumonia. Small left pleural effusion noted. Support tubes and lines projecting good position.  No pneumothorax. Electronically Signed   By: Drusilla Kanner M.D.   On: 11/04/2015 07:30   Dg Basil Dess Tube Plc W/fl-no Rad  11/03/2015  CLINICAL DATA:  NASO G TUBE PLACEMENT WITH FLUORO Fluoroscopy was utilized by the requesting physician.  No radiographic interpretation.     STUDIES:  Port CXR 3/28:  Personally reviewed by me. R PICC in good psition. ETT >5cm above carina. [place] CVL. L lung opacity unchanged. Low lung volumes. L chest tube in place.  MICROBIOLOGY: Tracheal Asp Ctx 3/27:  Oral Flora Urine Ctx 3/24:  Negative MRSA PCR 3/20:  Negative MRSA PCR 3/14:  Negative  ANTIBIOTICS: Fortaz 3/27>>> Cefuroxime 3/21 -  3/23 (periop prophylaxis) Vancomcyin 3/21 - 3/22  SIGNIFICANT EVENTS: 3/14 - Admit 3/14 - LHC 3/21 - CABG 3/27 - IABP replaced 3/28 - Chest Tubes removed. RHC & LHC.  LINES/TUBES: OETT 7.5 Extubated 3/22; 3.24>>> L Chest Tube>>> L  CVL 3/28>>> L IJ CVL 3/21>>> R PICC 3/23>>> IABP 3/27>>> R Femoral Art Sheath 3/27>>> L Radial Art Line 3/21>>> NGT R Nare 3/27>>> OGT 3/24>>> Foley 3/22>>>  ASSESSMENT / PLAN:  PULMONARY A: Acute Hypoxic Respiratory Failure L Chest Tube  P:   Full Vent Support Continue Xopenex q6hr SBT daily  CARDIOVASCULAR  A:  S/P CABG & CEA BiVentricular Heart Failure post MI Cardiogenic Shock Periop Complete Heart Block & Post-op V Tach H/O HTN  P:  CVTS & Cardiology Managing Amiodarone gtt Primacor gtt Lidocaine gtt Lasix gtt Heparin gtt Levophed gtt to maintain MAP IABP  RENAL A:   Acute Renal Failure  P:   Trending UOP with Foley Electrolytes & Renal Function daily Replacing electrolytes as indicated  GASTROINTESTINAL A:   Transaminitis - Likely shock liver/hepatic congestion. Constipation  P:   Trending LFTs daily Continuing TFs Protonix IV daily Dulcolax PR prn Start Senna bid  HEMATOLOGIC A:   Anemia - Hgb Stable. No signs of active bleeding.  P:  Heparin gtt Trending cell counts daily w/ CBC  INFECTIOUS A:   Possible HCAP - Ctx w/ Oral Flora.  P:   Fortaz Day #3/7 Empiric coverage Procalcitonin per algorithm  ENDOCRINE A:   Hyperglycemia - BG controlled.  P:   Continue Levemir bid Accu-checks q4hr SSI per algorithm  NEUROLOGIC A:   Sedation on Ventilator Post-op Pain Control   P:   RASS goal: 0 to -1 Versed gtt Fentanyl gtt  FAMILY UPDATES: Significant other updated at bedside 3/29 by Dr. Jamison Neighbor.  TODAY'S SUMMARY:  4 year old s/p CABG & CEA. Continuing in cardiogenic shock. On inhaled NO. Patient continuing on IABP. Cardiogenic shock is main limitation to extubation. Plan for daily  SBT. Continuing empiric Elita Quick given elevated procalcitonin.   I have spent a total of 34 minutes of critical care time today caring for the patient and reviewing the patient's electronic medical record.   Donna Christen Jamison Neighbor, M.D. [facility] Pulmonary & Critical Care Pager:  [phone] After 3pm or if no response, call [phone] 11/04/2015, 11:06 AM

## 2015-11-04 NOTE — Progress Notes (Addendum)
Patient ID: Christopher [name], male   DOB: August 25, 1960, 55 y.o.   MRN: [id number]   Christopher [name] is a 55 year old with a history of HTN, TIA, CHB, DMII, and marijuana use. On March 14th he presented to [facility] with increased dyspnea. On arrival he had inferior MI and CHB. Urgently transferred to [facility] for cath. LHC Ramus lesion, 70% stenosed, Prox LAD lesion, 95% stenosed, and prox RCA lesion, 100% stenosed. Developed pulmonary edema post cath and was diuresed IV lasix.   Cardiac Surgery consulted---10/27/15 S/P CABG x4 LIMA to LAD, SVG to DIAGONAL, SVG to RAMUS, INTERMEDIATE, SVG to RDA 3 and R CEA. Extubated 3/22. IABP removed 3/23 and remained on dopamine, milrinone, and epi. 3/23 had VF arrest requiring CPR shock x4. Re-intubated. CXR-infiltrate R lung base. Started on milrinone 0.2 mcg, amio drip, norepinephrine. Nitric oxide restarted at 10 ppm.  He had vfib arrest again 3/24 pm and lidocaine added.  VT 3/26-3/27 overnight again, 8 shocks, procainamide added and no further VT.  Stopped norepinephrine but milrinone up to 0.5 still with marginal co-ox.  In effort to decrease milrinone and decrease drive towards VT, he was taken back to cath lab and IABP placed again.  Milrinone decreased to 0.375 then 0.25.    No VT over the last day.  This morning, stable with CVP 17-19 and co-ox 59%. Awake/alert.  Creatinine a bit higher at 2.6.   ECG done with PPM off 3/26 => atypical atrial flutter with rate about 160 and slow escape rhythm consistent with ongoing complete heart block.   ECHO 10/02/15 EF 25-30% RV normal AK entire anteroseptal and inferoseptal myocardium.  TEE 10/27/2015: EF 35-40% RV mildly dilated TR mod regurg.  ECHO 10/30/15: EF 25-30%  LHC/RHC/IABP placement 3/27 Left Anterior Descending  Native left coronary was not injected to save contrast. LIMA-LAD was patent. SVG-small diagonal was patent. There was marked size mismatch between graft and artery with resulting slow flow. There appeared  to be about 80% stenosis in the diagonal distal to touchdown of graft.      Ramus Intermedius  Native LCA not injected to save contrast. SVG-ramus patent. There was a size mismatch between large vein graft and smaller caliber native artery. There appeared to be 80% stenosis in the ramus distal to the SVG touchdown and 70% stenosis proximal to SVG touchdown.     Left Circumflex  Not visualized, native LCA not injected to save contrast.     Right Coronary Artery  Native RCA known to be occluded, not injected. SVG-PDA patent.       Right Heart Pressures Procedural Findings: Hemodynamics (mmHg), on milrinone 0.5 RA mean 19 RV 44/23 PA 43/25, mean 33 PCWP mean 26 AO 124/71 Oxygen saturations: PA 64% AO 100% Cardiac Output (Fick) 6.52  Cardiac Index (Fick) 2.86 Cardiac Output (Thermo) 4.97 Cardiac Index (Thermo) 2.18    Scheduled Meds: . amiodarone  150 mg Intravenous Once  . antiseptic oral rinse  7 mL Mouth Rinse QID  . aspirin  325 mg Per Tube Daily  . atorvastatin  80 mg Oral q1800  . bisacodyl  10 mg Oral Daily   Or  . bisacodyl  10 mg Rectal Daily  . cefTAZidime (FORTAZ)  IV  1 g Intravenous Q8H  . chlorhexidine gluconate (SAGE KIT)  15 mL Mouth Rinse BID  . feeding supplement (VITAL 1.5 CAL)  1,000 mL Per Tube Q24H  . fentaNYL (SUBLIMAZE) injection  25 mcg Intravenous Once  . insulin aspart  0-24 Units Subcutaneous 6 times per day  . insulin detemir  12 Units Subcutaneous BID  . levalbuterol  1.25 mg Nebulization Q6H WA  . metolazone  2.5 mg Oral Once  . midazolam  1 mg Intravenous Once  . pantoprazole (PROTONIX) IV  40 mg Intravenous Daily  . potassium chloride  60 mEq Oral 3 times per day  . sennosides  5 mL Per Tube BID  . sodium chloride flush  10-40 mL Intracatheter Q12H   Continuous Infusions: . sodium chloride 10 mL/hr at 11/02/15 1900  . amiodarone 30 mg/hr (11/04/15 0800)  . dexmedetomidine Stopped (11/02/15 0630)  . fentaNYL infusion INTRAVENOUS  75 mcg/hr (11/04/15 0800)  . furosemide (LASIX) infusion 12 mg/hr (11/04/15 0800)  . heparin 1,200 Units/hr (11/04/15 0800)  . midazolam (VERSED) infusion 2 mg/hr (11/04/15 0800)  . milrinone 0.25 mcg/kg/min (11/04/15 0800)  . norepinephrine (LEVOPHED) Adult infusion Stopped (11/01/15 1930)   PRN Meds:.acetaminophen, fentaNYL, metoprolol, ondansetron (ZOFRAN) IV, ondansetron (ZOFRAN) IV, sodium chloride flush, traMADol    Filed Vitals:   11/04/15 0700 11/04/15 0737 11/04/15 0800 11/04/15 0830  BP:      Pulse: 89  89 90  Temp:      TempSrc:      Resp: 17  21 22   Height:      Weight:      SpO2: 93% 95% 98% 96%    Intake/Output Summary (Last 24 hours) at 11/04/15 0841 Last data filed at 11/04/15 0800  Gross per 24 hour  Intake 4347.6 ml  Output   4775 ml  Net -427.4 ml    LABS: Basic Metabolic Panel:  Recent Labs  11/03/15 0345  11/03/15 1154  11/04/15 0215 11/04/15 0757  NA 135  < > 135  < > 135 134*  K 3.9  < > 4.3  < > 4.4 4.5  CL 92*  < > 93*  < > 93* 90*  CO2 32  --  31  --  31  --   GLUCOSE 146*  < > 173*  < > 157* 156*  BUN 51*  < > 52*  < > 55* 53*  CREATININE 2.13*  < > 2.25*  < > 2.37* 2.60*  CALCIUM 8.1*  --  8.1*  --  8.1*  --   MG 2.5*  --   --   --  2.6*  --   PHOS  --   --   --   --  4.5  --   < > = values in this interval not displayed. Liver Function Tests:  Recent Labs  11/03/15 0345 11/04/15 0215  AST 101* 85*  ALT 139* 117*  ALKPHOS 73 74  BILITOT 1.3* 1.1  PROT 6.1* 5.8*  ALBUMIN 2.4* 2.4*   No results for input(s): LIPASE, AMYLASE in the last 72 hours. CBC:  Recent Labs  11/03/15 0345  11/04/15 0215 11/04/15 0757  WBC 9.9  --  10.5  --   HGB 8.9*  < > 8.7* 9.9*  HCT 26.7*  < > 27.9* 29.0*  MCV 91.4  --  92.7  --   PLT 271  --  217  --   < > = values in this interval not displayed. Cardiac Enzymes:  Recent Labs  11/02/15 0748 11/02/15 1535 11/02/15 2000  TROPONINI 1.12* 1.15* 1.12*   BNP: Invalid input(s):  POCBNP D-Dimer: No results for input(s): DDIMER in the last 72 hours. Hemoglobin A1C: No results for input(s): HGBA1C in the last 72  hours. Fasting Lipid Panel: No results for input(s): CHOL, HDL, LDLCALC, TRIG, CHOLHDL, LDLDIRECT in the last 72 hours. Thyroid Function Tests: No results for input(s): TSH, T4TOTAL, T3FREE, THYROIDAB in the last 72 hours.  Invalid input(s): FREET3 Anemia Panel: No results for input(s): VITAMINB12, FOLATE, FERRITIN, TIBC, IRON, RETICCTPCT in the last 72 hours.  RADIOLOGY: Dg Chest 2 View  10/23/2015  CLINICAL DATA:  CHF.  Weakness. EXAM: CHEST  2 VIEW COMPARISON:  10/21/2015 FINDINGS: Multiple interstitial densities in the lower chest are suggestive for interstitial edema, right side greater than left. Heart size is upper limits of normal. Trachea is midline. Negative for a pneumothorax. Evidence for a small right pleural effusion. IMPRESSION: Interstitial densities in the lower chest, right side greater than left. Findings are suggestive for interstitial pulmonary edema. Difficult to exclude some airspace disease in the right lower lung. Evidence for small right pleural effusion. Electronically Signed   By: Markus Daft M.D.   On: 10/23/2015 08:27   Dg Abd 1 View  11/03/2015  CLINICAL DATA:  Enteric tube advanced to post pyloric position. EXAM: ABDOMEN - 1 VIEW COMPARISON:  11/02/2015 abdominal radiographs. FINDINGS: Fluoroscopy time 7 minutes. Enteric tube courses through the stomach and duodenum and appears to terminate near the duodenal jejunal junction. Instilled contrast opacifies nondilated proximal jejunal small bowel loops. No contrast leak is demonstrated. Separate enteric tube terminates in the body of the stomach. Visualized lower sternotomy wires appear aligned and intact. IMPRESSION: Enteric tube appears to terminate near the duodenal jejunal junction. Separate enteric tube terminates in the body of the stomach. Electronically Signed   By: Ilona Sorrel  M.D.   On: 11/03/2015 17:02   Dg Chest Port 1 View  11/04/2015  CLINICAL DATA:  Status post CABG 10/28/2015. EXAM: PORTABLE CHEST 1 VIEW COMPARISON:  Single view of the chest 11/03/2015 and 11/02/2015. FINDINGS: Support tubes and lines including a left chest tube are unchanged since the most recent exam. The chest is better expanded today with decreased atelectasis. Airspace opacity in the left mid and lower lung zones persists. There is likely a left pleural effusion. Marked cardiomegaly is seen with only mild vascular congestion noted. IMPRESSION: Increased pulmonary expansion with decreased scattered atelectasis. Left mid and lower lung zone airspace disease could be due to atelectasis or pneumonia. Small left pleural effusion noted. Support tubes and lines projecting good position.  No pneumothorax. Electronically Signed   By: Inge Rise M.D.   On: 11/04/2015 07:30   Dg Chest Port 1 View  11/03/2015  CLINICAL DATA:  Central line placement. EXAM: PORTABLE CHEST 1 VIEW COMPARISON:  11/03/2015. FINDINGS: Left subclavian central line noted with tip at the cavoatrial junction. Interval removal of right chest tube. Endotracheal tube, NG tube, right PICC line, left IJ sheath left chest tube in stable position. No pneumothorax. Prior CABG. Stable cardiomegaly. Low lung volumes. A mild component of congestive heart failure cannot be excluded. Similar findings noted on prior exam P IMPRESSION: 1. Interim placement of left subclavian central line, its tip is at the cavoatrial junction. Interim removal right chest tube . No pneumothorax. 2. Remaining lines and tubes including left chest tube in stable position. 3. Prior CABG. Stable cardiomegaly. Low lung volumes. A a mild component congestive heart failure cannot be excluded . Similar findings noted on prior exam. Electronically Signed   By: Many   On: 11/03/2015 09:58   Dg Chest Port 1 View  11/03/2015  CLINICAL DATA:  Status post CABG on  October 27, 2015 persistent left lower lobe atelectasis or pneumonia. No pneumothorax or significant pleural effusion. EXAM: PORTABLE CHEST 1 VIEW COMPARISON:  Portable chest x-ray of November 02, 2015 at 5:48 a.m. FINDINGS: There has been interval placement of an intra aortic balloon pump whose marker lies over the junction of the aortic arch with the proximal descending thoracic aorta. The cardiac silhouette remains enlarged. The pulmonary vascularity remains mildly prominent. The retrocardiac region on the left remains dense. Bilateral chest tubes are in stable position. There is no pneumothorax nor significant pleural effusion. The endotracheal tube tip lies approximately 6.5 cm above the carina. The esophagogastric tube tip projects below the inferior margin of the image. The right-sided PICC line tip projects over the midportion of the SVC. A previously demonstrated pericardial drain is not clearly evident on today's study. IMPRESSION: Mild CHF. Persistent left lower lobe atelectasis or pneumonia. Interval placement of an intra aortic balloon pump which appears to be in appropriate position radiographically. The other support tubes and lines are in reasonable position. Electronically Signed   By: David  Martinique M.D.   On: 11/03/2015 07:27   Dg Chest Port 1 View  11/02/2015  CLINICAL DATA:  CABG. EXAM: PORTABLE CHEST 1 VIEW COMPARISON:  11/01/2015. FINDINGS: Endotracheal tube, NG tube, right PICC line, bilateral chest tubes in stable position. Prior CABG. Stable cardiomegaly . Persistent mild interstitial prominence, congestive heart failure cannot be excluded. No interim change. Low lung volumes with basilar atelectasis . IMPRESSION: 1. Endotracheal tube, NG tube, right PICC line, bilateral chest tubes in stable position . No pneumothorax. 2. Prior CABG. Cardiomegaly with pulmonary interstitial prominence and small left pleural effusion consistent with mild congestive heart failure. No interim change from prior exam .  Electronically Signed   By: Marcello Moores  Register   On: 11/02/2015 07:20   Dg Chest Port 1 View  11/01/2015  CLINICAL DATA:  Post CABG EXAM: PORTABLE CHEST 1 VIEW COMPARISON:  10/31/2015 FINDINGS: Cardiomediastinal silhouette is stable. Status post CABG. Stable endotracheal and NG tube position. Left chest tube is unchanged in position. Persistent central vascular congestion and mild interstitial prominence bilaterally suspicious for the mild interstitial edema. Left IJ sheath in place. Probable small left pleural effusion left basilar atelectasis or infiltrate. Right arm PICC line is unchanged in position. There is no pneumothorax. IMPRESSION: Status post CABG. Stable support apparatus. Stable left chest tube position. No pneumothorax. Again noted bilateral mild interstitial prominence and perihilar opacities suspicious for mild pulmonary edema. Probable small left pleural effusion left basilar atelectasis or infiltrate. Electronically Signed   By: Lahoma Crocker M.D.   On: 11/01/2015 09:57   Dg Chest Port 1 View  10/31/2015  CLINICAL DATA:  55 year old male with a history of endotracheal tube placement Patient has undergone coronary artery bypass grafting x4 10/27/2015. Placement of intra aortic balloon pump at this date. Ischemic cardiomyopathy. EXAM: PORTABLE CHEST 1 VIEW COMPARISON:  10/30/2015. FINDINGS: Endotracheal tube terminates approximately 3.4 cm above the carina, unchanged. Gastric tube terminates out of the field of view. Defibrillator pads project over the left and right chest. Bilateral thoracostomy tubes. Right upper extremity PICC. The mediastinal drain not visualized. Intra-aortic balloon pump not visualized. Surgical changes of median sternotomy and CABG. Mixed bilateral interstitial and airspace opacities. Low lung volumes. IMPRESSION: Low lung volumes with mixed bilateral interstitial and airspace opacities, similar to the comparison chest x-ray. Retrocardiac opacity may reflect persistent  pleural fluid, and/or atelectasis. Surgical support apparatus appear unchanged, as above. Intra aortic balloon pump  not visualized. Signed, Dulcy Fanny. Christopher Newport, DO [facility] Electronically Signed   By: Corrie Mckusick D.O.   On: 10/31/2015 07:25   Dg Chest Port 1 View  10/30/2015  CLINICAL DATA:  Post cavity.  Recent CABG. EXAM: PORTABLE CHEST 1 VIEW COMPARISON:  Portable film earlier today. FINDINGS: Overlying support apparatus redemonstrated. Tubes and lines remain stable. Elevated LEFT hemidiaphragm. No definite pneumothorax. Moderate vascular congestion may be increased. Cardiomegaly. IMPRESSION: Slight worsening aeration. Moderate vascular congestion may be increased. Electronically Signed   By: Staci Righter M.D.   On: 10/30/2015 18:39   Dg Chest Port 1 View  10/30/2015  CLINICAL DATA:  Status post coronary bypass grafting EXAM: PORTABLE CHEST 1 VIEW COMPARISON:  10/30/2015 FINDINGS: Cardiac shadow remains enlarged. Bilateral chest tubes are again seen. A right-sided PICC line and endotracheal tube are noted in satisfactory position. Left jugular sheath is noted. Elevation of left hemidiaphragm is noted with left basilar atelectasis. No pneumothorax is noted. IMPRESSION: Postsurgical change with tubes and lines as described. Mild left basilar atelectasis. Electronically Signed   By: Inez Catalina M.D.   On: 10/30/2015 07:45   Dg Chest Port 1 View  10/30/2015  CLINICAL DATA:  A chest tube in place EXAM: PORTABLE CHEST 1 VIEW COMPARISON:  10/30/2015 FINDINGS: Postoperative changes in the mediastinum. Endotracheal tube with tip measuring 6.3 cm about the carina. Right PICC catheter with tip over the cavoatrial junction. Left central venous catheter sheath with tip over the left neck consistent location in the internal jugular vein. Bilateral chest tubes. Infiltration or atelectasis in the right lung base. Cardiac enlargement. No significant vascular  congestion. No pneumothorax. IMPRESSION: Appliance positioned as described. Cardiac enlargement with infiltration or atelectasis in the right lung base. Bilateral chest tubes are present but no visualized pneumothorax. Electronically Signed   By: Lucienne Capers M.D.   On: 10/30/2015 03:07   Dg Chest Port 1 View  10/30/2015  CLINICAL DATA:  Shortness of breath.  Code blue. EXAM: PORTABLE CHEST 1 VIEW COMPARISON:  10/29/2015 FINDINGS: Postoperative changes in the mediastinum. Bilateral chest tubes are present. Right PICC line with tip over the cavoatrial junction. Left central venous catheter or sheath with tip over the left side of the neck, likely in the left internal jugular vein. Cardiac enlargement without significant vascular congestion. Shallow inspiration with elevation of the left hemidiaphragm. Probable left pleural effusion. No definite pulmonary consolidation. IMPRESSION: Shallow inspiration. Probable left pleural effusion. Cardiac enlargement. Appliances appear in satisfactory position. Electronically Signed   By: Lucienne Capers M.D.   On: 10/30/2015 01:18   Dg Chest Port 1 View  10/29/2015  CLINICAL DATA:  Postop CABG 2 days ago. EXAM: PORTABLE CHEST 1 VIEW COMPARISON:  Portable chest x-ray of October 28, 2015 FINDINGS: The trachea and esophagus have been extubated. The cardiac silhouette remains enlarged. The pulmonary vascularity is mildly engorged. The bilateral chest tubes and the mediastinal drain are in stable position. There is no pneumothorax or large pleural effusion. The Swan-Ganz catheter tip overlies a proximal right lower lobe pulmonary artery branch. The mediastinum is widened and accentuated by the hypo inflation. IMPRESSION: Bilateral hypoinflation following extubation of the trachea there is crowding of the pulmonary vascularity. Left basilar atelectasis or less likely pneumonia is more prominent today. Probable small left pleural effusion. The remaining support tubes and lines  are in stable position. Electronically Signed   By: David  Martinique M.D.   On: 10/29/2015 07:40   Dg Chest  Port 1 View  10/28/2015  CLINICAL DATA:  Post CABG, on ventilator, followup portable chest x-ray of 10/28/2015 EXAM: PORTABLE CHEST 1 VIEW COMPARISON:  None. FINDINGS: The tip of the endotracheal tube is approximately 5.2 cm above the carina. Aeration has improved slightly. Atelectasis remains at the lung bases left-greater-than-right. Swan-Ganz catheter tip is in the right lower lobe pulmonary artery and bilateral chest tubes remain. No pneumothorax is seen. Heart size is stable. IMPRESSION: 1. Improved aeration. 2. Bilateral chest tubes.  No pneumothorax. 3. Tip of endotracheal tube approximately 5.2 cm above the carina. Electronically Signed   By: Ivar Drape M.D.   On: 10/28/2015 08:01   Dg Chest Port 1 View  10/27/2015  CLINICAL DATA:  Status post CABG EXAM: PORTABLE CHEST 1 VIEW COMPARISON:  10/27/2015 FINDINGS: Unchanged tracheostomy tube. Swan-Ganz central venous line is now seen with tip advanced into the right perihilar region. Stable right chest tube with and left chest tube. NG tube again crosses the gastroesophageal junction. Stable mediastinal drain. Enlarged cardiac silhouette. Limited inspiratory effect with bibasilar opacities likely representing atelectasis. IMPRESSION: Anticipated postoperative appearance with bibasilar atelectasis. Electronically Signed   By: Skipper Cliche M.D.   On: 10/27/2015 19:25   Dg Chest Portable 1 View  10/27/2015  CLINICAL DATA:  Status post coronary bypass grafting EXAM: PORTABLE CHEST 1 VIEW COMPARISON:  10/23/2015 FINDINGS: Cardiac shadow is mildly enlarged. A Swan-Ganz catheter is noted in the right pulmonary outflow tract. Bilateral thoracostomy catheters are seen. No pneumothorax is noted. An endotracheal tube is noted in satisfactory position. Intra-aortic balloon pump is noted over the aortic arch. Mild left basilar atelectasis is seen. Mild central  vascular congestion is noted. IMPRESSION: Postoperative changes with tubes and lines as described above. Mild vascular congestion and left basilar atelectasis. Electronically Signed   By: Inez Catalina M.D.   On: 10/27/2015 17:23   Portable Chest X-ray 1 View  10/21/2015  CLINICAL DATA:  55 year old male with a history of acute systolic congestive heart failure EXAM: PORTABLE CHEST 1 VIEW COMPARISON:  10/20/2015 FINDINGS: Heart size unchanged, enlarged. Similar appearance of low lung volumes with interstitial opacities, interlobular septal thickening, and developing airspace disease of the bilateral lower lungs. No pneumothorax. Linear opacity overlying the mediastinum and the heart border, of on certain significance. Favored to overlie the patient. IMPRESSION: Evidence of worsening congestive heart failure, with increasing edema. New linear opacity overlies the mediastinum, favored to be overlying the patient, though cannot be localized on this view. Signed, Dulcy Fanny. Christopher Newport, DO [facility] Electronically Signed   By: Corrie Mckusick D.O.   On: 10/21/2015 07:16   Dg Chest Portable 1 View  10/20/2015  CLINICAL DATA:  Shortness of breath. EXAM: PORTABLE CHEST 1 VIEW COMPARISON:  None. FINDINGS: Midline trachea. Cardiomegaly accentuated by AP portable technique. No pleural effusion or pneumothorax. Mildly low lung volumes with bibasilar atelectasis. IMPRESSION: Mild cardiomegaly and low lung volumes.  No acute findings. Electronically Signed   By: Abigail Miyamoto M.D.   On: 10/20/2015 18:09   Dg Abd Portable 1v  11/02/2015  CLINICAL DATA:  Assess feeding tube positioning EXAM: PORTABLE ABDOMEN - 1 VIEW COMPARISON:  None in PACs FINDINGS: The repeated KUB with has less motion artifact. The tip of the feeding tube and nasogastric tube lie in the region of the distal gastric body in the pre-pyloric region. The bowel gas pattern is within the limits of normal.  Numerous tubes and lines and electrodes overlie the  abdomen. IMPRESSION: The tips of the feeding tube and the esophagogastric tube lie in the region of the distal gastric body in the pre-pyloric region. Electronically Signed   By: David  Martinique M.D.   On: 11/02/2015 12:16   Dg Addison Bailey G Tube Plc W/fl-no Rad  11/03/2015  CLINICAL DATA:  NASO G TUBE PLACEMENT WITH FLUORO Fluoroscopy was utilized by the requesting physician.  No radiographic interpretation.    PHYSICAL EXAM General: Awake on vent Neck: JVP 12+ cm, no thyromegaly or thyroid nodule.  Lungs: Decreased breath sounds at bases bilaterally.  CV: Nondisplaced PMI.  Heart regular S1/S2, no S3/S4, no murmur.  2+ edema to knees bilaterally. Abdomen: Soft, nontender, no hepatosplenomegaly, no distention.  Neurologic: Alert and follows commands.  Psych: Normal affect. Extremities: No clubbing or cyanosis.   TELEMETRY: Reviewed telemetry, v-pacing.    ASSESSMENT AND PLAN:  1. CAD: S/p late presentation inferior MI and CABG x 4. Continue statin, ASA. Coronary angiography 3/27 with multiple VT episodes showed patent LIMA-LAD and SVG-RCA.  Patent SVG-ramus and SVG-D but slow flow in both grafts with diffusely diseased small target vessels.  2. Cardiogenic shock: Acute systolic CHF with RV failure post inferior MI with suspected RV involvement. EF 25-30% on echo (difficult images on 3/24 echo). He has significant RV failure from suspected RV infarct.  3/27 IABP placed to try to allow decrease in milrinone with frequent VT.  Co-ox this morning 59%.  He remains volume overloaded with CVP 17-19.  Will need to watch creatinine, it is trending up.  Good UOP but I/O even yesterday and weight stable.  - Today, continue milrinone 0.25.  Continue IABP 1:1.  - NO to 1 ppm, probably stop today.  - Continue Lasix gtt and add metolazone 2.5 x 1 again today (same regimen).  Replace K aggressively, better this morning at 4.5.  Repeat BMET in pm.  3. Complete  heart block:  Underlying CHB still.   Atrial rhythm actually appears to be an atypical atrial flutter.  He is pacing at 90 bpm. Suspect he will need permanent pacing if he recovers, would use BiV device.   4. VF arrest 3/24 early am and again at night 3/24. VT overnight 3/26 and during the day 3/27.  K now better and on lower milrinone dose.  Procainamide stopped yesterday with no problems.  - Today, will stop lidocaine and continue amiodarone.  Restart lidocaine if increased ectopy.  - K is better now, watch carefully throughout day and replete aggressively with diuresis.  - If recovers, will need CRT-D.  5. AKI: Creatinine up a bit today.  Got minimal contrast (30 cc) with cath on 3/27.  Will need to follow closely with diuresis.  6. ID: Possible LLL PNA, elevated PCT.  He is on ceftazidime.   35 minutes critical care time.   Loralie Champagne 11/04/2015 8:41 AM

## 2015-11-04 NOTE — Progress Notes (Signed)
Pharmacy Antibiotic Note  Christopher [name] is a 55 y.o. male with pneumonia.  Pharmacy has been consulted for vancomycin dosing.  Plan: Vancomycin 2000mg  IV x then 1250mg  IV every 24 hours.  Goal trough 15-20 mcg/mL.  Height: 5\' 10"  (177.8 cm) Weight: 246 lb 11.2 oz (111.902 kg) IBW/kg (Calculated) : 73  Temp (24hrs), Avg:98.6 F (37 C), Min:98.2 F (36.8 C), Max:99.1 F (37.3 C)   Recent Labs Lab 10/31/15 0327  11/01/15 0400  11/02/15 0020  11/03/15 0345  11/03/15 2000 11/04/15 0215 11/04/15 0757 11/04/15 1340 11/04/15 2013  WBC 11.5*  --  11.4*  --  10.0  --  9.9  --   --  10.5  --   --   --   CREATININE 1.91*  < > 2.02*  < > 1.85*  < > 2.13*  < > 2.30* 2.37* 2.60* 2.64* 2.60*  < > = values in this interval not displayed.  Estimated Creatinine Clearance: 40.7 mL/min (by C-G formula based on Cr of 2.6).    Allergies  Allergen Reactions  . Losartan Shortness Of Breath  . Lisinopril Rash    Antimicrobials this admission: 3/21 cefuroxime >> 3/23  3/27 ceftazidime >> 3/29 3/29 vancomycin >>  3/29 Zosyn >>   Microbiology results: 3/24 urine>> neg  3/27 resp>> normal flora   Thank you for allowing pharmacy to be a part of this patient's care.  Vernard Gambles, PharmD, BCPS  11/04/2015 11:25 PM

## 2015-11-04 NOTE — Progress Notes (Deleted)
eLink Physician-Brief Progress Note Patient Name: Christopher [name] DOB: 08-Jun-1961 MRN: [id number]   Date of Service  11/04/2015  HPI/Events of Note  Agitation, wirthing in bed, c/o pain, wants to go home. Pain meds held because of admission with altered metal status. Unable to take PO ultram or neurontin  eICU Interventions  Fentanyl 25 mcg one dose. Close monitoring.      Intervention Category Intermediate Interventions: Other:  Elsbeth Yearick 11/04/2015, 3:40 PM

## 2015-11-04 NOTE — Op Note (Signed)
NAME:  Christopher [name], Christopher [name] NO.:  [id number]  MEDICAL RECORD NO.:  [id number]  LOCATION:  2S15C                        FACILITY:  [facility]  PHYSICIAN:  Kerin Perna, M.D.  DATE OF BIRTH:  1960-11-20  DATE OF PROCEDURE:  11/03/2015 DATE OF DISCHARGE:                              OPERATIVE REPORT   OPERATION:  Placement of left subclavian vein central line-triple-lumen catheter.  PREOPERATIVE DIAGNOSIS:  Status post myocardial infarction then urgent CABG, prolonged inotrope requirements, and IV access required.  POSTOPERATIVE DIAGNOSIS:  Status post myocardial infarction then urgent CABG, prolonged inotrope requirements, and IV access required.  ANESTHESIA:  Local 1% lidocaine with IV conscious sedation, monitored.  SURGEON:  Kerin Perna, M.D.  INDICATIONS:  The patient is a 55 year old, obese male, presented to the hospital with a completed right coronary occlusion and a large infarct from a hyper-dominant right coronary as well as RV infarct.  He underwent subsequent combined CABG with carotid endarterectomy. Postoperatively, he has had persistent RV dysfunction, ventricular rhythm, and has required multiple IV drug infusions and a central line was needed.  Informed consent was obtained from the wife.  OPERATIVE PROCEDURE:  The left upper chest was prepped and draped as a sterile field.  A proper time-out was performed.  Local 1% lidocaine was infiltrated in the skin beneath the clavicle.  Using the Seldinger technique, the left subclavian vein was entered and a guidewire was passed distally without resistance.  A dilator was passed over the wire, then the triple-lumen catheter.  The catheter went in easily.  The wire was removed, and the catheter was flushed and its 3 lumens capped and secured to the skin with silk sutures.  A sterile dressing was applied. Total sterile technique with gown, mask, and cap was used.  Chest x-ray is pending.     Kerin Perna, M.D.     PV/MEDQ  D:  11/03/2015  T:  11/04/2015  Job:  [id number]

## 2015-11-04 NOTE — Progress Notes (Signed)
Patient ID: Christopher [name], male   DOB: Nov 19, 1960, 55 y.o.   MRN: [id number]  Previous Elink note entered in error and was deleted. Please ignore the note.

## 2015-11-04 NOTE — Progress Notes (Signed)
ANTICOAGULATION CONSULT NOTE - Follow Up Consult  Pharmacy Consult for Heparin Indication: IABP  Allergies  Allergen Reactions  . Losartan Shortness Of Breath  . Lisinopril Rash    Patient Measurements: Height: 5\' 10"  (177.8 cm) Weight: 246 lb 11.2 oz (111.902 kg) IBW/kg (Calculated) : 73 Heparin Dosing Weight: 99kg  Vital Signs: Temp: 99.1 F (37.3 C) (03/29 0347) Temp Source: Oral (03/29 0347) BP: 148/62 mmHg (03/29 0309) Pulse Rate: 92 (03/29 1000)  Labs:  Recent Labs  11/02/15 0020 11/02/15 0748  11/02/15 1535  11/02/15 2000  11/03/15 0345  11/03/15 1100  11/03/15 1620  11/03/15 2000 11/04/15 0215 11/04/15 0757  HGB 8.7*  --   < >  --   < >  --   < > 8.9*  < >  --   --   --   < > 9.5* 8.7* 9.9*  HCT 26.8*  --   < >  --   < >  --   < > 26.7*  < >  --   --   --   < > 28.0* 27.9* 29.0*  PLT 203  --   --   --   --   --   --  271  --   --   --   --   --   --  217  --   HEPARINUNFRC  --   --   --   --   --   --   < > 0.14*  --  0.30  --  0.33  --   --  0.25*  --   CREATININE 1.85*  --   < > 1.85*  < >  --   < > 2.13*  < >  --   < >  --   < > 2.30* 2.37* 2.60*  TROPONINI  --  1.12*  --  1.15*  --  1.12*  --   --   --   --   --   --   --   --   --   --   < > = values in this interval not displayed.  Estimated Creatinine Clearance: 40.7 mL/min (by C-G formula based on Cr of 2.6).   Medications:  Heparin @ 1200 units/hr  Assessment: 55yom continues on heparin for IABP that was placed yesterday in the cath lab. Heparin level is therapeutic at 0.25. Hb low stable, platelets are normal. No bleeding reported.  Goal of Therapy:  Heparin level 0.2-0.5 units/ml Monitor platelets by anticoagulation protocol: Yes   Plan:  1) Continue heparin at 1200 units/hr 2) Daily HL and CBC  Tad Moore, BCPS  Clinical Pharmacist Pager [phone]  11/04/2015 11:58 AM

## 2015-11-04 NOTE — Progress Notes (Signed)
RT note-recruitment maneuver performed, patient tolerated well, sp02 97% during, then fell back to 94%, continue to monitor.

## 2015-11-04 NOTE — Progress Notes (Signed)
CT surgery p.m. Rounds  Patient had a stable day hemodynamically However oxygenation has declined and patient now on 10 of PEEP No sustained VT today 2 feeds at goal Patient responsive on vent Last chest tube removed Nitric oxide resumed at 10 ppm to help improve oxygenation CVP finally 14-15 cm H2O.

## 2015-11-04 NOTE — Progress Notes (Signed)
RT note- RN increased fio2 to 60%, reported sp02 88-89%.

## 2015-11-04 NOTE — Progress Notes (Signed)
Nutrition Follow Up  DOCUMENTATION CODES:   Obesity unspecified  INTERVENTION:    D/C Vital 1.5 formula  Initiate Vital High Protein formula at 20 ml/hr and increase by 10 ml every 4 hours to goal rate of 50 ml/hr  Prostat liquid protein po 30 ml TID  Total TF regimen to provide 1500 kcals, 150 gm protein, 1003 ml of free water  NUTRITION DIAGNOSIS:   Inadequate oral intake related to inability to eat as evidenced by NPO status, ongoing  GOAL:   Provide needs based on ASPEN/SCCM guidelines, currently unmet  MONITOR:   Vent status, Labs, Weight trends, Skin, I & O's  ASSESSMENT:   Pt with hx of HTN admitted 3/14 at [facility] with SOB. Pt with inferior STEMI and in complete heart block s/p heart cath on admission.   Pt had CABG x 4 3/22. Vib arrest, defibrillated x 4. Extubated post-op and re-intubated 3/24.  Patient is currently on ventilator support -- OGT in place Temp (24hrs), Avg:98.5 F (36.9 C), Min:97.9 F (36.6 C), Max:99.1 F (37.3 C)   Vital 1.5 formula initiated 3/28.  Currently infusing at 50 ml/hr via small bore feeding tube (tip near duodenal jejunal junction) providing 1800 kcals, 81 gm protein, 917 ml of free water.  Verbal with readback order received per Dr. Donata Clay to change current TF regimen to meet ASPEN/SCCM guidelines.  Diet Order:   NPO  Skin:  Reviewed, no issues (Neck, chest, and leg incisions)  Last BM:  3/20  Height:   Ht Readings from Last 1 Encounters:  10/31/15 5\' 10"  (1.778 m)    Weight:   Wt Readings from Last 1 Encounters:  11/04/15 246 lb 11.2 oz (111.902 kg)  Admission weight: 247 lb (112.1 kg)  Ideal Body Weight:  75.4 kg  BMI:  Body mass index is 35.4 kg/(m^2).  Estimated Nutritional Needs:   Kcal:  6553-7482  Protein:  150-160 gm  Fluid:  per MD  EDUCATION NEEDS:   No education needs identified at this time  Maureen Chatters, RD, LDN Pager #: [phone] After-Hours Pager #: [phone]

## 2015-11-04 NOTE — Progress Notes (Signed)
PCCM INTERVAL PROGRESS NOTE  Called to bedside by rounding MD to evaluate patient in setting of new hypoxemia. Patient is on ventilator with increasing FiO2 demands throughout the day. Recent changes to patients regimen include removal of L chest tube, discontinuation of lidocaine, and discontinuation of inhaled nitric. On evaluation the patient has overall coarse breath sounds, which are somewhat decreased on the L. He appears air hungry, diaphoretic, and with increased work of breathing.   Interventions: Awaiting CXR to rule out recurrent PTX Increase PEEP to 10cmH2O Continue current management Can add back nitric if cannot stabilize SpO2 goal > 88% So far, has responded well to increased PEEP  Additional CC time 25 mins.  Joneen Roach, AGACNP-BC [facility] Pulmonology/Critical Care Pager [phone] or [phone]  11/04/2015 2:39 PM

## 2015-11-05 ENCOUNTER — Inpatient Hospital Stay (HOSPITAL_COMMUNITY): Payer: BLUE CROSS/BLUE SHIELD

## 2015-11-05 DIAGNOSIS — J9612 Chronic respiratory failure with hypercapnia: Secondary | ICD-10-CM

## 2015-11-05 LAB — CBC
HCT: 26.4 % — ABNORMAL LOW (ref 39.0–52.0)
HCT: 28.6 % — ABNORMAL LOW (ref 39.0–52.0)
Hemoglobin: 8.1 g/dL — ABNORMAL LOW (ref 13.0–17.0)
Hemoglobin: 9.1 g/dL — ABNORMAL LOW (ref 13.0–17.0)
MCH: 28.4 pg (ref 26.0–34.0)
MCH: 29.1 pg (ref 26.0–34.0)
MCHC: 30.7 g/dL (ref 30.0–36.0)
MCHC: 31.8 g/dL (ref 30.0–36.0)
MCV: 92.6 fL (ref 78.0–100.0)
MCV: 91.4 fL (ref 78.0–100.0)
Platelets: 225 10*3/uL (ref 150–400)
Platelets: 229 10*3/uL (ref 150–400)
RBC: 2.85 MIL/uL — ABNORMAL LOW (ref 4.22–5.81)
RBC: 3.13 MIL/uL — ABNORMAL LOW (ref 4.22–5.81)
RDW: 16.1 % — ABNORMAL HIGH (ref 11.5–15.5)
RDW: 17 % — ABNORMAL HIGH (ref 11.5–15.5)
WBC: 10.3 10*3/uL (ref 4.0–10.5)
WBC: 12.1 10*3/uL — ABNORMAL HIGH (ref 4.0–10.5)

## 2015-11-05 LAB — POCT I-STAT 3, ART BLOOD GAS (G3+)
Acid-Base Excess: 13 mmol/L — ABNORMAL HIGH (ref 0.0–2.0)
BICARBONATE: 38.8 meq/L — AB (ref 20.0–24.0)
O2 Saturation: 100 %
PCO2 ART: 57.1 mmHg — AB (ref 35.0–45.0)
PH ART: 7.441 (ref 7.350–7.450)
TCO2: 40 mmol/L (ref 0–100)
pO2, Arterial: 294 mmHg — ABNORMAL HIGH (ref 80.0–100.0)

## 2015-11-05 LAB — BASIC METABOLIC PANEL
ANION GAP: 14 (ref 5–15)
ANION GAP: 12 (ref 5–15)
BUN: 69 mg/dL — AB (ref 6–20)
BUN: 78 mg/dL — ABNORMAL HIGH (ref 6–20)
CALCIUM: 8.2 mg/dL — AB (ref 8.9–10.3)
CHLORIDE: 93 mmol/L — AB (ref 101–111)
CO2: 32 mmol/L (ref 22–32)
CO2: 32 mmol/L (ref 22–32)
Calcium: 8.1 mg/dL — ABNORMAL LOW (ref 8.9–10.3)
Chloride: 93 mmol/L — ABNORMAL LOW (ref 101–111)
Creatinine, Ser: 2.87 mg/dL — ABNORMAL HIGH (ref 0.61–1.24)
Creatinine, Ser: 3.16 mg/dL — ABNORMAL HIGH (ref 0.61–1.24)
GFR calc Af Amer: 27 mL/min — ABNORMAL LOW (ref >60)
GFR, EST AFRICAN AMERICAN: 24 mL/min — AB (ref >60)
GFR, EST NON AFRICAN AMERICAN: 23 mL/min — AB (ref >60)
GFR, EST NON AFRICAN AMERICAN: 21 mL/min — AB (ref >60)
GLUCOSE: 121 mg/dL — AB (ref 65–99)
Glucose, Bld: 126 mg/dL — ABNORMAL HIGH (ref 65–99)
POTASSIUM: 4 mmol/L (ref 3.5–5.1)
POTASSIUM: 4.7 mmol/L (ref 3.5–5.1)
Sodium: 139 mmol/L (ref 135–145)
Sodium: 137 mmol/L (ref 135–145)

## 2015-11-05 LAB — PHOSPHORUS: Phosphorus: 5.2 mg/dL — ABNORMAL HIGH (ref 2.5–4.6)

## 2015-11-05 LAB — MAGNESIUM: Magnesium: 2.6 mg/dL — ABNORMAL HIGH (ref 1.7–2.4)

## 2015-11-05 LAB — CARBOXYHEMOGLOBIN
Carboxyhemoglobin: 1.6 % — ABNORMAL HIGH (ref 0.5–1.5)
Methemoglobin: 0.8 % (ref 0.0–1.5)
O2 Saturation: 80 %
Total hemoglobin: 8.3 g/dL — ABNORMAL LOW (ref 13.5–18.0)

## 2015-11-05 LAB — GLUCOSE, CAPILLARY
GLUCOSE-CAPILLARY: 117 mg/dL — AB (ref 65–99)
GLUCOSE-CAPILLARY: 126 mg/dL — AB (ref 65–99)
GLUCOSE-CAPILLARY: 107 mg/dL — AB (ref 65–99)
GLUCOSE-CAPILLARY: 106 mg/dL — AB (ref 65–99)
Glucose-Capillary: 111 mg/dL — ABNORMAL HIGH (ref 65–99)
Glucose-Capillary: 109 mg/dL — ABNORMAL HIGH (ref 65–99)

## 2015-11-05 LAB — HEPARIN LEVEL (UNFRACTIONATED): Heparin Unfractionated: 0.26 IU/mL — ABNORMAL LOW (ref 0.30–0.70)

## 2015-11-05 LAB — PROCALCITONIN: PROCALCITONIN: 1.17 ng/mL

## 2015-11-05 LAB — PREPARE RBC (CROSSMATCH)

## 2015-11-05 MED ORDER — POTASSIUM CHLORIDE 20 MEQ/15ML (10%) PO SOLN
40.0000 meq | Freq: Three times a day (TID) | ORAL | Status: DC
  Administered 2015-11-05 – 2015-11-11 (×18): 40 meq via ORAL
  Filled 2015-11-05 (×18): qty 30

## 2015-11-05 MED ORDER — POTASSIUM CHLORIDE 10 MEQ/50ML IV SOLN
INTRAVENOUS | Status: AC
  Administered 2015-11-05: 10 meq via INTRAVENOUS
  Filled 2015-11-05: qty 150

## 2015-11-05 MED ORDER — ALBUMIN HUMAN 25 % IV SOLN
12.5000 g | Freq: Four times a day (QID) | INTRAVENOUS | Status: AC
  Administered 2015-11-05 – 2015-11-06 (×3): 12.5 g via INTRAVENOUS
  Filled 2015-11-05 (×4): qty 50

## 2015-11-05 MED ORDER — POTASSIUM CHLORIDE 10 MEQ/50ML IV SOLN
10.0000 meq | INTRAVENOUS | Status: AC
  Administered 2015-11-05 (×4): 10 meq via INTRAVENOUS

## 2015-11-05 NOTE — Progress Notes (Signed)
Dr. Belia Heman CCM made aware of critical CO2 57 on morning ABG. No ventilator changes ordered at this time. Will continue to closely monitor. Thresa Ross RN

## 2015-11-05 NOTE — Progress Notes (Signed)
3 Days Post-Op Procedure(s) (LRB): Right/Left Heart Cath and Coronary/Graft Angiography (N/A) Intra-Aortic Balloon Pump Insertion Subjective: Patient has reached a stable state with excellent CO-ox, decreased CVP and resolution of VT Creat is now > 3 so will decrease lasix to 10mg /hrNext step is probably slow wean of IABP- will check echo first   Objective: Vital signs in last 24 hours: Temp:  [97.4 F (36.3 C)-99.3 F (37.4 C)] 97.6 F (36.4 C) (03/30 1500) Pulse Rate:  [92] 92 (03/30 1624) Cardiac Rhythm:  [-] A-V Sequential paced (03/30 0800) Resp:  [12-35] 15 (03/30 1624) BP: (132-134)/(65-66) 132/66 mmHg (03/30 0321) SpO2:  [83 %-100 %] 97 % (03/30 1624) Arterial Line BP: (97-151)/(55-68) 106/59 mmHg (03/30 1500) FiO2 (%):  [40 %-100 %] 40 % (03/30 1624) Weight:  [240 lb (108.863 kg)] 240 lb (108.863 kg) (03/30 0700)  Hemodynamic parameters for last 24 hours: CVP:  [12 mmHg-16 mmHg] 16 mmHg  Intake/Output from previous day: 03/29 0701 - 03/30 0700 In: 4315.4 [I.V.:2095.4; NG/GT:1670; IV Piggyback:550] Out: 4855 [Urine:4355; Emesis/NG output:450; Chest Tube:50] Intake/Output this shift: Total I/O In: 1812.2 [I.V.:747.2; Blood:335; NG/GT:580; IV Piggyback:150] Out: 1300 [Urine:1300]       Exam    General- alert and comfortable   Lungs- clear without rales, wheezes   Cor- regular rate and rhythm, no murmur , gallop   Abdomen- soft, non-tender   Extremities - warm, non-tender, minimal edema   Neuro- oriented, appropriate, no focal weakness   Lab Results:  Recent Labs  11/05/15 0455 11/05/15 1502  WBC 10.3 12.1*  HGB 8.1* 9.1*  HCT 26.4* 28.6*  PLT 225 229   BMET:  Recent Labs  11/05/15 0455 11/05/15 1502  NA 139 137  K 4.0 4.7  CL 93* 93*  CO2 32 32  GLUCOSE 121* 126*  BUN 69* 78*  CREATININE 2.87* 3.16*  CALCIUM 8.1* 8.2*    PT/INR: No results for input(s): LABPROT, INR in the last 72 hours. ABG    Component Value Date/Time   PHART 7.441  11/05/2015 0404   HCO3 38.8* 11/05/2015 0404   TCO2 40 11/05/2015 0404   ACIDBASEDEF TEST WILL BE CREDITED 11/01/2015 0446   O2SAT 80.0 11/05/2015 0410   CBG (last 3)   Recent Labs  11/05/15 0745 11/05/15 1116 11/05/15 1315  GLUCAP 126* 107* 111*    Assessment/Plan: S/P Procedure(s) (LRB): Right/Left Heart Cath and Coronary/Graft Angiography (N/A) Intra-Aortic Balloon Pump Insertion Wean nitric oxide this pm Follow anemia- pt on iv heparin drip   LOS: 16 days    Christopher [name] 11/05/2015

## 2015-11-05 NOTE — Progress Notes (Signed)
Patient ID: Christopher [name], male   DOB: [birth year], 55 y.o.   MRN: [id number]   Christopher [name] is a 55 year old with a history of HTN, TIA, CHB, DMII, and marijuana use. On March 14th he presented to [facility] with increased dyspnea. On arrival he had inferior MI and CHB. Urgently transferred to [facility] for cath. LHC Ramus lesion, 70% stenosed, Prox LAD lesion, 95% stenosed, and prox RCA lesion, 100% stenosed. Developed pulmonary edema post cath and was diuresed IV lasix.   Cardiac Surgery consulted---10/27/15 S/P CABG x4 LIMA to LAD, SVG to DIAGONAL, SVG to RAMUS, INTERMEDIATE, SVG to RDA 3 and R CEA. Extubated 3/22. IABP removed 3/23 and remained on dopamine, milrinone, and epi. 3/23 had VF arrest requiring CPR shock x4. Re-intubated. CXR-infiltrate R lung base. Started on milrinone 0.2 mcg, amio drip, norepinephrine. Nitric oxide restarted at 10 ppm.  He had vfib arrest again 3/24 pm and lidocaine added.  VT 3/26-3/27 overnight again, 8 shocks, procainamide added and no further VT.  Stopped norepinephrine but milrinone up to 0.5 still with marginal co-ox.  In effort to decrease milrinone and decrease drive towards VT, he was taken back to cath lab and IABP placed again.  Milrinone decreased to 0.375 then 0.25.    No further VT.  He is now off lidocaine and procainamide.  CVP 14 today (lower) with creatinine up to 2.87.  Co-ox 80%.  Weight down.    CXR with left lower and mid lung PNA.    ECG done with PPM off 3/26 => atypical atrial flutter with rate about 160 and slow escape rhythm consistent with ongoing complete heart block.   ECHO 10/02/15 EF 25-30% RV normal AK entire anteroseptal and inferoseptal myocardium.  TEE 10/27/2015: EF 35-40% RV mildly dilated TR mod regurg.  ECHO 10/30/15: EF 25-30%  LHC/RHC/IABP placement 3/27 Left Anterior Descending  Native left coronary was not injected to save contrast. LIMA-LAD was patent. SVG-small diagonal was patent. There was marked size mismatch between  graft and artery with resulting slow flow. There appeared to be about 80% stenosis in the diagonal distal to touchdown of graft.      Ramus Intermedius  Native LCA not injected to save contrast. SVG-ramus patent. There was a size mismatch between large vein graft and smaller caliber native artery. There appeared to be 80% stenosis in the ramus distal to the SVG touchdown and 70% stenosis proximal to SVG touchdown.     Left Circumflex  Not visualized, native LCA not injected to save contrast.     Right Coronary Artery  Native RCA known to be occluded, not injected. SVG-PDA patent.       Right Heart Pressures Procedural Findings: Hemodynamics (mmHg), on milrinone 0.5 RA mean 19 RV 44/23 PA 43/25, mean 33 PCWP mean 26 AO 124/71 Oxygen saturations: PA 64% AO 100% Cardiac Output (Fick) 6.52  Cardiac Index (Fick) 2.86 Cardiac Output (Thermo) 4.97 Cardiac Index (Thermo) 2.18    Scheduled Meds: . amiodarone  150 mg Intravenous Once  . antiseptic oral rinse  7 mL Mouth Rinse QID  . aspirin  325 mg Per Tube Daily  . atorvastatin  80 mg Oral q1800  . bisacodyl  10 mg Oral Daily   Or  . bisacodyl  10 mg Rectal Daily  . chlorhexidine gluconate (SAGE KIT)  15 mL Mouth Rinse BID  . feeding supplement (PRO-STAT SUGAR FREE 64)  30 mL Per Tube TID  . fentaNYL (SUBLIMAZE) injection  25 mcg Intravenous Once  .  insulin aspart  0-24 Units Subcutaneous 6 times per day  . insulin detemir  12 Units Subcutaneous BID  . levalbuterol  1.25 mg Nebulization Q6H WA  . lidocaine (cardiac) 100 mg/26m  100 mg Intravenous Once  . midazolam  1 mg Intravenous Once  . pantoprazole (PROTONIX) IV  40 mg Intravenous Daily  . piperacillin-tazobactam (ZOSYN)  IV  3.375 g Intravenous 3 times per day  . potassium chloride  10 mEq Intravenous Q1 Hr x 3  . potassium chloride  40 mEq Oral 3 times per day  . sennosides  5 mL Per Tube BID  . silver sulfADIAZINE   Topical BID  . sodium chloride flush  10-40 mL  Intracatheter Q12H  . [START ON 11/06/2015] vancomycin  1,250 mg Intravenous Q24H   Continuous Infusions: . sodium chloride 10 mL/hr (11/04/15 1525)  . amiodarone 30 mg/hr (11/05/15 0438)  . feeding supplement (VITAL HIGH PROTEIN) 1,000 mL (11/04/15 1600)  . fentaNYL infusion INTRAVENOUS 200 mcg/hr (11/05/15 0700)  . furosemide (LASIX) infusion 12 mg/hr (11/05/15 0830)  . heparin 1,200 Units/hr (11/04/15 2000)  . midazolam (VERSED) infusion 4 mg/hr (11/05/15 0700)  . milrinone 0.25 mcg/kg/min (11/05/15 0700)  . norepinephrine (LEVOPHED) Adult infusion Stopped (11/01/15 1930)   PRN Meds:.acetaminophen, fentaNYL, metoprolol, ondansetron (ZOFRAN) IV, ondansetron (ZOFRAN) IV, sodium chloride flush, traMADol    Filed Vitals:   11/05/15 0600 11/05/15 0635 11/05/15 0700 11/05/15 0800  BP:      Pulse: 92 92 92 92  Temp:   97.4 F (36.3 C)   TempSrc:   Axillary   Resp: _0 Height:      Weight:   240 lb (108.863 kg)   SpO2: 100% 100% 97% 100%    Intake/Output Summary (Last 24 hours) at 11/05/15 0802 Last data filed at 11/05/15 0729  Gross per 24 hour  Intake 4021.49 ml  Output   4460 ml  Net -438.51 ml    LABS: Basic Metabolic Panel:  Recent Labs  11/04/15 0215  11/04/15 1340  11/04/15 2341 11/05/15 0455  NA 135  < > 136  < > 134* 139  K 4.4  < > 4.2  < > 4.6 4.0  CL 93*  < > 92*  < > 89* 93*  CO2 31  --  33*  --   --  32  GLUCOSE 157*  < > 148*  < > 134* 121*  BUN 55*  < > 58*  < > 62* 69*  CREATININE 2.37*  < > 2.64*  < > 2.80* 2.87*  CALCIUM 8.1*  --  8.1*  --   --  8.1*  MG 2.6*  --   --   --   --  2.6*  PHOS 4.5  --   --   --   --  5.2*  < > = values in this interval not displayed. Liver Function Tests:  Recent Labs  11/03/15 0345 11/04/15 0215  AST 101* 85*  ALT 139* 117*  ALKPHOS 73 74  BILITOT 1.3* 1.1  PROT 6.1* 5.8*  ALBUMIN 2.4* 2.4*   No results for input(s): LIPASE, AMYLASE in the last 72 hours. CBC:  Recent Labs  11/04/15 0215   11/04/15 2341 11/05/15 0455  WBC 10.5  --   --  10.3  HGB 8.7*  < > 9.5* 8.1*  HCT 27.9*  < > 28.0* 26.4*  MCV 92.7  --   --  92.6  PLT 217  --   --  225  < > = values in this interval not displayed. Cardiac Enzymes:  Recent Labs  11/02/15 1535 11/02/15 2000  TROPONINI 1.15* 1.12*   BNP: Invalid input(s): POCBNP D-Dimer: No results for input(s): DDIMER in the last 72 hours. Hemoglobin A1C: No results for input(s): HGBA1C in the last 72 hours. Fasting Lipid Panel: No results for input(s): CHOL, HDL, LDLCALC, TRIG, CHOLHDL, LDLDIRECT in the last 72 hours. Thyroid Function Tests: No results for input(s): TSH, T4TOTAL, T3FREE, THYROIDAB in the last 72 hours.  Invalid input(s): FREET3 Anemia Panel: No results for input(s): VITAMINB12, FOLATE, FERRITIN, TIBC, IRON, RETICCTPCT in the last 72 hours.  RADIOLOGY: Dg Chest 2 View  10/23/2015  CLINICAL DATA:  CHF.  Weakness. EXAM: CHEST  2 VIEW COMPARISON:  10/21/2015 FINDINGS: Multiple interstitial densities in the lower chest are suggestive for interstitial edema, right side greater than left. Heart size is upper limits of normal. Trachea is midline. Negative for a pneumothorax. Evidence for a small right pleural effusion. IMPRESSION: Interstitial densities in the lower chest, right side greater than left. Findings are suggestive for interstitial pulmonary edema. Difficult to exclude some airspace disease in the right lower lung. Evidence for small right pleural effusion. Electronically Signed   By: Markus Daft M.D.   On: 10/23/2015 08:27   Dg Abd 1 View  11/03/2015  CLINICAL DATA:  Enteric tube advanced to post pyloric position. EXAM: ABDOMEN - 1 VIEW COMPARISON:  11/02/2015 abdominal radiographs. FINDINGS: Fluoroscopy time 7 minutes. Enteric tube courses through the stomach and duodenum and appears to terminate near the duodenal jejunal junction. Instilled contrast opacifies nondilated proximal jejunal small bowel loops. No contrast leak  is demonstrated. Separate enteric tube terminates in the body of the stomach. Visualized lower sternotomy wires appear aligned and intact. IMPRESSION: Enteric tube appears to terminate near the duodenal jejunal junction. Separate enteric tube terminates in the body of the stomach. Electronically Signed   By: Ilona Sorrel M.D.   On: 11/03/2015 17:02   Dg Chest Port 1 View  11/05/2015  CLINICAL DATA:  Respiratory failure. EXAM: PORTABLE CHEST 1 VIEW COMPARISON:  11/04/2015 . FINDINGS: Endotracheal tube, NG tube, right PICC line stable position. Prior CABG. Cardiomegaly. Persistent left mid and lower lung field infiltrate. Persistent mild atelectatic changes right upper and right lower lobes. No pneumothorax. IMPRESSION: 1.  Lines and tubes in stable position. 2. Persistent left mid and left lower lung infiltrate, no interim change. Persistent right upper lobe and right lower lobe subsegmental atelectasis. No interim change. 3. Prior CABG.  Stable cardiomegaly. Electronically Signed   By: Marcello Moores  Register   On: 11/05/2015 07:48   Dg Chest Port 1 View  11/04/2015  CLINICAL DATA:  Hypoxia EXAM: PORTABLE CHEST 1 VIEW COMPARISON:  November 04, 2015 FINDINGS: Endotracheal tube tip is 5.3 cm above the carina. Central catheter tip is in the superior vena cava near the cavoatrial junction. Nasogastric tube tip and side port are below the diaphragm. There also is a feeding tube with the tip below the diaphragm. No pneumothorax. There has been removal of a left chest tube. There is airspace consolidation throughout the left mid and lower lung zones, stable. There is mild atelectasis in the right upper lobe, stable. No new opacity is evident. There is stable cardiomegaly. The pulmonary vascularity is normal. No adenopathy is evident. IMPRESSION: Tube and catheter positions as described without pneumothorax. Airspace consolidation throughout the left mid and lower lung zones, stable. Atelectasis right upper lobe, stable. No new  opacity.  Stable cardiomegaly. Electronically Signed   By: Lowella Grip III M.D.   On: 11/04/2015 14:52   Dg Chest Port 1 View  11/04/2015  CLINICAL DATA:  Status post CABG 10/28/2015. EXAM: PORTABLE CHEST 1 VIEW COMPARISON:  Single view of the chest 11/03/2015 and 11/02/2015. FINDINGS: Support tubes and lines including a left chest tube are unchanged since the most recent exam. The chest is better expanded today with decreased atelectasis. Airspace opacity in the left mid and lower lung zones persists. There is likely a left pleural effusion. Marked cardiomegaly is seen with only mild vascular congestion noted. IMPRESSION: Increased pulmonary expansion with decreased scattered atelectasis. Left mid and lower lung zone airspace disease could be due to atelectasis or pneumonia. Small left pleural effusion noted. Support tubes and lines projecting good position.  No pneumothorax. Electronically Signed   By: Inge Rise M.D.   On: 11/04/2015 07:30   Dg Chest Port 1 View  11/03/2015  CLINICAL DATA:  Central line placement. EXAM: PORTABLE CHEST 1 VIEW COMPARISON:  11/03/2015. FINDINGS: Left subclavian central line noted with tip at the cavoatrial junction. Interval removal of right chest tube. Endotracheal tube, NG tube, right PICC line, left IJ sheath left chest tube in stable position. No pneumothorax. Prior CABG. Stable cardiomegaly. Low lung volumes. A mild component of congestive heart failure cannot be excluded. Similar findings noted on prior exam P IMPRESSION: 1. Interim placement of left subclavian central line, its tip is at the cavoatrial junction. Interim removal right chest tube . No pneumothorax. 2. Remaining lines and tubes including left chest tube in stable position. 3. Prior CABG. Stable cardiomegaly. Low lung volumes. A a mild component congestive heart failure cannot be excluded . Similar findings noted on prior exam. Electronically Signed   By: Ripley   On: 11/03/2015 09:58     Dg Chest Port 1 View  11/03/2015  CLINICAL DATA:  Status post CABG on October 27, 2015 persistent left lower lobe atelectasis or pneumonia. No pneumothorax or significant pleural effusion. EXAM: PORTABLE CHEST 1 VIEW COMPARISON:  Portable chest x-ray of November 02, 2015 at 5:48 a.m. FINDINGS: There has been interval placement of an intra aortic balloon pump whose marker lies over the junction of the aortic arch with the proximal descending thoracic aorta. The cardiac silhouette remains enlarged. The pulmonary vascularity remains mildly prominent. The retrocardiac region on the left remains dense. Bilateral chest tubes are in stable position. There is no pneumothorax nor significant pleural effusion. The endotracheal tube tip lies approximately 6.5 cm above the carina. The esophagogastric tube tip projects below the inferior margin of the image. The right-sided PICC line tip projects over the midportion of the SVC. A previously demonstrated pericardial drain is not clearly evident on today's study. IMPRESSION: Mild CHF. Persistent left lower lobe atelectasis or pneumonia. Interval placement of an intra aortic balloon pump which appears to be in appropriate position radiographically. The other support tubes and lines are in reasonable position. Electronically Signed   By: David  Martinique M.D.   On: 11/03/2015 07:27   Dg Chest Port 1 View  11/02/2015  CLINICAL DATA:  CABG. EXAM: PORTABLE CHEST 1 VIEW COMPARISON:  11/01/2015. FINDINGS: Endotracheal tube, NG tube, right PICC line, bilateral chest tubes in stable position. Prior CABG. Stable cardiomegaly . Persistent mild interstitial prominence, congestive heart failure cannot be excluded. No interim change. Low lung volumes with basilar atelectasis . IMPRESSION: 1. Endotracheal tube, NG tube, right PICC line, bilateral chest tubes in stable position .  No pneumothorax. 2. Prior CABG. Cardiomegaly with pulmonary interstitial prominence and small left pleural effusion  consistent with mild congestive heart failure. No interim change from prior exam . Electronically Signed   By: Marcello Moores  Register   On: 11/02/2015 07:20   Dg Chest Port 1 View  11/01/2015  CLINICAL DATA:  Post CABG EXAM: PORTABLE CHEST 1 VIEW COMPARISON:  10/31/2015 FINDINGS: Cardiomediastinal silhouette is stable. Status post CABG. Stable endotracheal and NG tube position. Left chest tube is unchanged in position. Persistent central vascular congestion and mild interstitial prominence bilaterally suspicious for the mild interstitial edema. Left IJ sheath in place. Probable small left pleural effusion left basilar atelectasis or infiltrate. Right arm PICC line is unchanged in position. There is no pneumothorax. IMPRESSION: Status post CABG. Stable support apparatus. Stable left chest tube position. No pneumothorax. Again noted bilateral mild interstitial prominence and perihilar opacities suspicious for mild pulmonary edema. Probable small left pleural effusion left basilar atelectasis or infiltrate. Electronically Signed   By: Lahoma Crocker M.D.   On: 11/01/2015 09:57   Dg Chest Port 1 View  10/31/2015  CLINICAL DATA:  55 year old male with a history of endotracheal tube placement Patient has undergone coronary artery bypass grafting x4 10/27/2015. Placement of intra aortic balloon pump at this date. Ischemic cardiomyopathy. EXAM: PORTABLE CHEST 1 VIEW COMPARISON:  10/30/2015. FINDINGS: Endotracheal tube terminates approximately 3.4 cm above the carina, unchanged. Gastric tube terminates out of the field of view. Defibrillator pads project over the left and right chest. Bilateral thoracostomy tubes. Right upper extremity PICC. The mediastinal drain not visualized. Intra-aortic balloon pump not visualized. Surgical changes of median sternotomy and CABG. Mixed bilateral interstitial and airspace opacities. Low lung volumes. IMPRESSION: Low lung volumes with mixed bilateral interstitial and airspace opacities,  similar to the comparison chest x-ray. Retrocardiac opacity may reflect persistent pleural fluid, and/or atelectasis. Surgical support apparatus appear unchanged, as above. Intra aortic balloon pump not visualized. Signed, Dulcy Fanny. Earleen Newport, DO [facility] Electronically Signed   By: Corrie Mckusick D.O.   On: 10/31/2015 07:25   Dg Chest Port 1 View  10/30/2015  CLINICAL DATA:  Post cavity.  Recent CABG. EXAM: PORTABLE CHEST 1 VIEW COMPARISON:  Portable film earlier today. FINDINGS: Overlying support apparatus redemonstrated. Tubes and lines remain stable. Elevated LEFT hemidiaphragm. No definite pneumothorax. Moderate vascular congestion may be increased. Cardiomegaly. IMPRESSION: Slight worsening aeration. Moderate vascular congestion may be increased. Electronically Signed   By: Staci Righter M.D.   On: 10/30/2015 18:39   Dg Chest Port 1 View  10/30/2015  CLINICAL DATA:  Status post coronary bypass grafting EXAM: PORTABLE CHEST 1 VIEW COMPARISON:  10/30/2015 FINDINGS: Cardiac shadow remains enlarged. Bilateral chest tubes are again seen. A right-sided PICC line and endotracheal tube are noted in satisfactory position. Left jugular sheath is noted. Elevation of left hemidiaphragm is noted with left basilar atelectasis. No pneumothorax is noted. IMPRESSION: Postsurgical change with tubes and lines as described. Mild left basilar atelectasis. Electronically Signed   By: Inez Catalina M.D.   On: 10/30/2015 07:45   Dg Chest Port 1 View  10/30/2015  CLINICAL DATA:  A chest tube in place EXAM: PORTABLE CHEST 1 VIEW COMPARISON:  10/30/2015 FINDINGS: Postoperative changes in the mediastinum. Endotracheal tube with tip measuring 6.3 cm about the carina. Right PICC catheter with tip over the cavoatrial junction. Left central venous catheter sheath with tip over the left neck consistent location in the internal jugular vein. Bilateral chest  tubes. Infiltration or  atelectasis in the right lung base. Cardiac enlargement. No significant vascular congestion. No pneumothorax. IMPRESSION: Appliance positioned as described. Cardiac enlargement with infiltration or atelectasis in the right lung base. Bilateral chest tubes are present but no visualized pneumothorax. Electronically Signed   By: Lucienne Capers M.D.   On: 10/30/2015 03:07   Dg Chest Port 1 View  10/30/2015  CLINICAL DATA:  Shortness of breath.  Code blue. EXAM: PORTABLE CHEST 1 VIEW COMPARISON:  10/29/2015 FINDINGS: Postoperative changes in the mediastinum. Bilateral chest tubes are present. Right PICC line with tip over the cavoatrial junction. Left central venous catheter or sheath with tip over the left side of the neck, likely in the left internal jugular vein. Cardiac enlargement without significant vascular congestion. Shallow inspiration with elevation of the left hemidiaphragm. Probable left pleural effusion. No definite pulmonary consolidation. IMPRESSION: Shallow inspiration. Probable left pleural effusion. Cardiac enlargement. Appliances appear in satisfactory position. Electronically Signed   By: Lucienne Capers M.D.   On: 10/30/2015 01:18   Dg Chest Port 1 View  10/29/2015  CLINICAL DATA:  Postop CABG 2 days ago. EXAM: PORTABLE CHEST 1 VIEW COMPARISON:  Portable chest x-ray of October 28, 2015 FINDINGS: The trachea and esophagus have been extubated. The cardiac silhouette remains enlarged. The pulmonary vascularity is mildly engorged. The bilateral chest tubes and the mediastinal drain are in stable position. There is no pneumothorax or large pleural effusion. The Swan-Ganz catheter tip overlies a proximal right lower lobe pulmonary artery branch. The mediastinum is widened and accentuated by the hypo inflation. IMPRESSION: Bilateral hypoinflation following extubation of the trachea there is crowding of the pulmonary vascularity. Left basilar atelectasis or less likely pneumonia is more prominent  today. Probable small left pleural effusion. The remaining support tubes and lines are in stable position. Electronically Signed   By: David  Martinique M.D.   On: 10/29/2015 07:40   Dg Chest Port 1 View  10/28/2015  CLINICAL DATA:  Post CABG, on ventilator, followup portable chest x-ray of 10/28/2015 EXAM: PORTABLE CHEST 1 VIEW COMPARISON:  None. FINDINGS: The tip of the endotracheal tube is approximately 5.2 cm above the carina. Aeration has improved slightly. Atelectasis remains at the lung bases left-greater-than-right. Swan-Ganz catheter tip is in the right lower lobe pulmonary artery and bilateral chest tubes remain. No pneumothorax is seen. Heart size is stable. IMPRESSION: 1. Improved aeration. 2. Bilateral chest tubes.  No pneumothorax. 3. Tip of endotracheal tube approximately 5.2 cm above the carina. Electronically Signed   By: Ivar Drape M.D.   On: 10/28/2015 08:01   Dg Chest Port 1 View  10/27/2015  CLINICAL DATA:  Status post CABG EXAM: PORTABLE CHEST 1 VIEW COMPARISON:  10/27/2015 FINDINGS: Unchanged tracheostomy tube. Swan-Ganz central venous line is now seen with tip advanced into the right perihilar region. Stable right chest tube with and left chest tube. NG tube again crosses the gastroesophageal junction. Stable mediastinal drain. Enlarged cardiac silhouette. Limited inspiratory effect with bibasilar opacities likely representing atelectasis. IMPRESSION: Anticipated postoperative appearance with bibasilar atelectasis. Electronically Signed   By: Skipper Cliche M.D.   On: 10/27/2015 19:25   Dg Chest Portable 1 View  10/27/2015  CLINICAL DATA:  Status post coronary bypass grafting EXAM: PORTABLE CHEST 1 VIEW COMPARISON:  10/23/2015 FINDINGS: Cardiac shadow is mildly enlarged. A Swan-Ganz catheter is noted in the right pulmonary outflow tract. Bilateral thoracostomy catheters are seen. No pneumothorax is noted. An endotracheal tube is noted in satisfactory position. Intra-aortic balloon pump  is noted over the aortic arch. Mild left basilar atelectasis is seen. Mild central vascular congestion is noted. IMPRESSION: Postoperative changes with tubes and lines as described above. Mild vascular congestion and left basilar atelectasis. Electronically Signed   By: Inez Catalina M.D.   On: 10/27/2015 17:23   Portable Chest X-ray 1 View  10/21/2015  CLINICAL DATA:  55 year old male with a history of acute systolic congestive heart failure EXAM: PORTABLE CHEST 1 VIEW COMPARISON:  10/20/2015 FINDINGS: Heart size unchanged, enlarged. Similar appearance of low lung volumes with interstitial opacities, interlobular septal thickening, and developing airspace disease of the bilateral lower lungs. No pneumothorax. Linear opacity overlying the mediastinum and the heart border, of on certain significance. Favored to overlie the patient. IMPRESSION: Evidence of worsening congestive heart failure, with increasing edema. New linear opacity overlies the mediastinum, favored to be overlying the patient, though cannot be localized on this view. Signed, Dulcy Fanny. Earleen Newport, DO [facility] Electronically Signed   By: Corrie Mckusick D.O.   On: 10/21/2015 07:16   Dg Chest Portable 1 View  10/20/2015  CLINICAL DATA:  Shortness of breath. EXAM: PORTABLE CHEST 1 VIEW COMPARISON:  None. FINDINGS: Midline trachea. Cardiomegaly accentuated by AP portable technique. No pleural effusion or pneumothorax. Mildly low lung volumes with bibasilar atelectasis. IMPRESSION: Mild cardiomegaly and low lung volumes.  No acute findings. Electronically Signed   By: Abigail Miyamoto M.D.   On: 10/20/2015 18:09   Dg Abd Portable 1v  11/02/2015  CLINICAL DATA:  Assess feeding tube positioning EXAM: PORTABLE ABDOMEN - 1 VIEW COMPARISON:  None in PACs FINDINGS: The repeated KUB with has less motion artifact. The tip of the feeding tube and nasogastric tube lie in the region of the distal gastric body  in the pre-pyloric region. The bowel gas pattern is within the limits of normal. Numerous tubes and lines and electrodes overlie the abdomen. IMPRESSION: The tips of the feeding tube and the esophagogastric tube lie in the region of the distal gastric body in the pre-pyloric region. Electronically Signed   By: David  Martinique M.D.   On: 11/02/2015 12:16   Dg Addison Bailey G Tube Plc W/fl-no Rad  11/03/2015  CLINICAL DATA:  NASO G TUBE PLACEMENT WITH FLUORO Fluoroscopy was utilized by the requesting physician.  No radiographic interpretation.    PHYSICAL EXAM General: Awake on vent Neck: JVP 8-9 cm, no thyromegaly or thyroid nodule.  Lungs: Decreased breath sounds at bases bilaterally.  CV: Nondisplaced PMI.  Heart regular S1/S2, no S3/S4, no murmur.  2+ edema to knees bilaterally. Abdomen: Soft, nontender, no hepatosplenomegaly, no distention.  Neurologic: Alert and follows commands.  Psych: Normal affect. Extremities: No clubbing or cyanosis. Right groin IABP site stable. Good peripheral pulses.   TELEMETRY: Reviewed telemetry, v-pacing.    ASSESSMENT AND PLAN:  1. CAD: S/p late presentation inferior MI and CABG x 4. Continue statin, ASA. Coronary angiography 3/27 with multiple VT episodes showed patent LIMA-LAD and SVG-RCA.  Patent SVG-ramus and SVG-D but slow flow in both grafts with diffusely diseased small target vessels.  2. Cardiogenic shock: Acute systolic CHF with RV failure post inferior MI with suspected RV involvement. EF 25-30% on echo (difficult images on 3/24 echo). He has significant RV failure from suspected RV infarct.  3/27 IABP placed to try to allow decrease in milrinone with frequent VT.  Co-ox this morning 80%.  He remains volume overloaded but improved with CVP down to 14.   - Would  keep milrinone and IABP support the same today, await stabilization of renal function.  - He is back on NO.  - Continue Lasix gtt at 12 mg/hr today.  With rise in creatinine to 2.87, will not  give metolazone today.  CVP better than prior.  3. Complete heart block:  Underlying CHB still.   Atrial rhythm actually appears to be an atypical atrial flutter.  He is pacing at 90 bpm. Suspect he will need permanent pacing if he recovers, would use BiV device.   4. VF arrest 3/24 early am and again at night 3/24. VT overnight 3/26 and during the day 3/27.  K now better and on lower milrinone dose.  He is off lidocaine and procainamide.  - Continue amiodarone.  - K is better now, watch carefully throughout day and replete aggressively with diuresis.  - If recovers, will need CRT-D.  5. AKI: Creatinine up slightly again today.  Got minimal contrast (30 cc) with cath on 3/27.  Will continue Lasix but will not give metolazone today.  6. ID: Possible LLL PNA, elevated PCT.  He is on vanco/Zosyn.    35 minutes critical care time.   Loralie Champagne 11/05/2015 8:02 AM

## 2015-11-05 NOTE — Progress Notes (Signed)
ANTICOAGULATION CONSULT NOTE - Follow Up Consult  Pharmacy Consult for Heparin Indication: IABP  Allergies  Allergen Reactions  . Losartan Shortness Of Breath  . Lisinopril Rash    Patient Measurements: Height: 5\' 10"  (177.8 cm) Weight: 240 lb (108.863 kg) IBW/kg (Calculated) : 73 Heparin Dosing Weight: 99kg  Vital Signs: Temp: 97.8 F (36.6 C) (03/30 1015) Temp Source: Axillary (03/30 1015) BP: 132/66 mmHg (03/30 0321) Pulse Rate: 92 (03/30 1015)  Labs:  Recent Labs  11/02/15 1535  11/02/15 2000  11/03/15 0345  11/03/15 1620  11/04/15 0215  11/04/15 2013 11/04/15 2341 11/05/15 0455 11/05/15 0500  HGB  --   < >  --   < > 8.9*  < >  --   < > 8.7*  < > 9.5* 9.5* 8.1*  --   HCT  --   < >  --   < > 26.7*  < >  --   < > 27.9*  < > 28.0* 28.0* 26.4*  --   PLT  --   --   --   --  271  --   --   --  217  --   --   --  225  --   HEPARINUNFRC  --   --   --   --  0.14*  < > 0.33  --  0.25*  --   --   --   --  0.26*  CREATININE 1.85*  < >  --   < > 2.13*  < >  --   < > 2.37*  < > 2.60* 2.80* 2.87*  --   TROPONINI 1.15*  --  1.12*  --   --   --   --   --   --   --   --   --   --   --   < > = values in this interval not displayed.  Estimated Creatinine Clearance: 36.4 mL/min (by C-G formula based on Cr of 2.87).   Medications:  Heparin @ 1200 units/hr  Assessment: 55yom continues on heparin for IABP. Heparin level is therapeutic at 0.26. Hgb dropped 9.5 to 8.1 - receiving transfusions. No signs of active bleeding.  Goal of Therapy:  Heparin level 0.2-0.5 units/ml Monitor platelets by anticoagulation protocol: Yes   Plan:  1) Continue heparin at 1200 units/hr 2) Heparin level, CBC in AM   Louie Casa, PharmD, BCPS  11/05/2015 10:25 AM

## 2015-11-05 NOTE — Progress Notes (Signed)
eLink Physician-Brief Progress Note Patient Name: Christopher [name] DOB: 10-06-1960 MRN: [id number]   Date of Service  11/05/2015  HPI/Events of Note  ABG reviewed: 7.44/57/200  eICU Interventions  No new changes, patient likely has baseline pco2 in upper 50's        Mandeep Kiser 11/05/2015, 4:09 AM

## 2015-11-05 NOTE — Progress Notes (Addendum)
PULMONARY / CRITICAL CARE MEDICINE   Name: Christopher [name] MRN: [id number] DOB: 1961-01-26    ADMISSION DATE:  10/20/2015 CONSULTATION DATE:  11/03/2015  REFERRING MD: Dr. Donata Clay / CVTS  CHIEF COMPLAINT:  Acute Hypoxic Respiratory Failure  HISTORY OF PRESENT ILLNESS:   [name] w/ ischemic cardiomyopathy following MI. Patient underwent CABG & CEA. Post-operative course complicated by arrhythmia and biventricular failure. Continues to have IABP in place. Requiring NO inhaled on ventilator. Continuing on Primacor and diuretics. Patient underwent remove of chest tubes today and replacement of IABP.   SUBJECTIVE: Patient desaturated further yesterday after weaning off Inh NO overnight. PEEP was increased & Inh NO was restarted by CVTS.  REVIEW OF SYSTEMS:  Unable to obtain given intubation & sedation.  VITAL SIGNS: BP 132/66 mmHg  Pulse 92  Temp(Src) 97.4 F (36.3 C) (Axillary)  Resp 13  Ht  (1.778 m)  Wt 240 lb (108.863 kg)  BMI 34.44 kg/m2  SpO2 100%  HEMODYNAMICS: CVP:  [12 mmHg-19 mmHg] 15 mmHg  VENTILATOR SETTINGS: Vent Mode:  [-] PRVC FiO2 (%):  [40 %-100 %] 50 % Set Rate:  [12 bmp-14 bmp] 14 bmp Vt Set:  [590 mL-650 mL] 650 mL PEEP:  [5 cmH20-10 cmH20] 10 cmH20 Plateau Pressure:  [24 cmH20-28 cmH20] 26 cmH20  INTAKE / OUTPUT: I/O last 3 completed shifts: In: 6314.2 [I.V.:3174.2; NG/GT:2390; IV Piggyback:750] Out: 4098 [JXBJY:7829; Emesis/NG output:750; Chest Tube:100]  PHYSICAL EXAMINATION: General:  Comfortable. Girlfriend at bedside. Neuro:  Sedated. Pupils equal. HEENT:  ETT in place. No scleral icterus or injection. Cardiovascular:  Regular rate & rhythm. No appreciable JVD. Lower extremity edema persists. Lungs:  Coarse BS bilaterally. Symmetric chest rise on ventilator. Abdomen:  Soft. Protuberant. Increasing BS. Integument:  Warm & dry. No rash on exposed skin.  LABS:  BMET  Recent Labs Lab 11/04/15 0215  11/04/15 1340  11/04/15 2013 11/04/15 2341 11/05/15 0455  NA 135  < > 136 134* 134* 139  K 4.4  < > 4.2 4.3 4.6 4.0  CL 93*  < > 92* 88* 89* 93*  CO2 31  --  33*  --   --  32  BUN 55*  < > 58* 58* 62* 69*  CREATININE 2.37*  < > 2.64* 2.60* 2.80* 2.87*  GLUCOSE 157*  < > 148* 122* 134* 121*  < > = values in this interval not displayed.  Electrolytes  Recent Labs Lab 11/03/15 0345  11/04/15 0215 11/04/15 1340 11/05/15 0455  CALCIUM 8.1*  < > 8.1* 8.1* 8.1*  MG 2.5*  --  2.6*  --  2.6*  PHOS  --   --  4.5  --  5.2*  < > = values in this interval not displayed.  CBC  Recent Labs Lab 11/03/15 0345  11/04/15 0215  11/04/15 2013 11/04/15 2341 11/05/15 0455  WBC 9.9  --  10.5  --   --   --  10.3  HGB 8.9*  < > 8.7*  < > 9.5* 9.5* 8.1*  HCT 26.7*  < > 27.9*  < > 28.0* 28.0* 26.4*  PLT 271  --  217  --   --   --  225  < > = values in this interval not displayed.  Coag's No results for input(s): APTT, INR in the last 168 hours.  Sepsis Markers  Recent Labs Lab 11/03/15 1154 11/04/15 0215 11/05/15 0455  PROCALCITON 1.75 1.50 1.17    ABG  Recent Labs Lab  11/04/15 1516 11/04/15 2047 11/05/15 0404  PHART 7.427 7.441 7.441  PCO2ART 52.0* 56.2* 57.1*  PO2ART 72.0* 192.0* 294.0*    Liver Enzymes  Recent Labs Lab 11/02/15 0020 11/03/15 0345 11/04/15 0215  AST 144* 101* 85*  ALT 156* 139* 117*  ALKPHOS 70 73 74  BILITOT 1.6* 1.3* 1.1  ALBUMIN 2.6* 2.4* 2.4*    Cardiac Enzymes  Recent Labs Lab 11/02/15 0748 11/02/15 1535 11/02/15 2000  TROPONINI 1.12* 1.15* 1.12*    Glucose  Recent Labs Lab 11/04/15 0349 11/04/15 1247 11/04/15 1515 11/04/15 1946 11/04/15 2339 11/05/15 0402  GLUCAP 157* 144* 135* 122* 130* 117*    Imaging Dg Chest Port 1 View  11/05/2015  CLINICAL DATA:  Respiratory failure. EXAM: PORTABLE CHEST 1 VIEW COMPARISON:  11/04/2015 . FINDINGS: Endotracheal tube, NG tube, right PICC line stable position. Prior CABG. Cardiomegaly.  Persistent left mid and lower lung field infiltrate. Persistent mild atelectatic changes right upper and right lower lobes. No pneumothorax. IMPRESSION: 1.  Lines and tubes in stable position. 2. Persistent left mid and left lower lung infiltrate, no interim change. Persistent right upper lobe and right lower lobe subsegmental atelectasis. No interim change. 3. Prior CABG.  Stable cardiomegaly. Electronically Signed   By: Maisie Fus  Register   On: 11/05/2015 07:48   Dg Chest Port 1 View  11/04/2015  CLINICAL DATA:  Hypoxia EXAM: PORTABLE CHEST 1 VIEW COMPARISON:  November 04, 2015 FINDINGS: Endotracheal tube tip is 5.3 cm above the carina. Central catheter tip is in the superior vena cava near the cavoatrial junction. Nasogastric tube tip and side port are below the diaphragm. There also is a feeding tube with the tip below the diaphragm. No pneumothorax. There has been removal of a left chest tube. There is airspace consolidation throughout the left mid and lower lung zones, stable. There is mild atelectasis in the right upper lobe, stable. No new opacity is evident. There is stable cardiomegaly. The pulmonary vascularity is normal. No adenopathy is evident. IMPRESSION: Tube and catheter positions as described without pneumothorax. Airspace consolidation throughout the left mid and lower lung zones, stable. Atelectasis right upper lobe, stable. No new opacity. Stable cardiomegaly. Electronically Signed   By: Bretta Bang III M.D.   On: 11/04/2015 14:52     STUDIES:  Port CXR 3/28:  Previously reviewed by me. R PICC in good psition. ETT >5cm above carina. [place] CVL. L lung opacity unchanged. Low lung volumes. L chest tube in place. Port CXR 3/30:  Personally reviewed by me.  MICROBIOLOGY: Tracheal Asp Ctx 3/27:  Oral Flora Urine Ctx 3/24:  Negative MRSA PCR 3/20:  Negative MRSA PCR 3/14:  Negative  ANTIBIOTICS: Fortaz 3/27>>> Cefuroxime 3/21 - 3/23 (periop prophylaxis) Vancomcyin 3/21 -  3/22  SIGNIFICANT EVENTS: 3/14 - Admit 3/14 - LHC 3/21 - CABG 3/27 - IABP replaced 3/28 - Chest Tubes removed. RHC & LHC.  LINES/TUBES: OETT 7.5 Extubated 3/22; 3.24>>> L Chest Tube>>> L [place] CVL 3/28>>> L IJ CVL 3/21>>> R PICC 3/23>>> IABP 3/27>>> R Femoral Art Sheath 3/27>>> L Radial Art Line 3/21>>> NGT R Nare 3/27>>> OGT 3/24>>> Foley 3/22>>>  ASSESSMENT / PLAN:  PULMONARY A: Acute Hypoxic Respiratory Failure Chronic Hypercarbic Respiratory Failure - OSA/OHS vs COPD. L Chest Tube  P:   Full Vent Support Continue Xopenex q6hr Holding SBT InH NO 10ppm  CARDIOVASCULAR A:  S/P CABG & CEA BiVentricular Heart Failure post MI Cardiogenic Shock Periop Complete Heart Block & Post-op V Tach H/O  HTN  P:  CVTS & Cardiology Managing Amiodarone gtt Primacor gtt Lidocaine gtt Lasix gtt Heparin gtt Levophed gtt to maintain MAP IABP  RENAL A:   Acute Renal Failure  P:   Trending UOP with Foley Electrolytes & Renal Function daily Replacing electrolytes as indicated  GASTROINTESTINAL A:   Transaminitis - Likely shock liver/hepatic congestion. Constipation  P:   Trending LFTs daily Continuing TFs Protonix IV daily Dulcolax PR prn Senna bid  HEMATOLOGIC A:   Anemia - Hgb worsening. No signs of active bleeding.  P:  Transfusion PRBC ordered Heparin gtt Trending cell counts daily w/ CBC  INFECTIOUS A:   Possible HCAP - Ctx w/ Oral Flora.  P:   Fortaz Day #4/7 Empiric coverage Procalcitonin per algorithm  ENDOCRINE A:   Hyperglycemia - BG controlled.  P:   Continue Levemir bid Accu-checks q4hr SSI per algorithm  NEUROLOGIC A:   Sedation on Ventilator Post-op Pain Control   P:   RASS goal: 0 to -1 Versed gtt Fentanyl gtt  FAMILY UPDATES: Significant other updated at bedside 3/30 by Dr. Jamison Neighbor.  TODAY'S SUMMARY:  81 year old s/p CABG & CEA. Continuing in cardiogenic shock. On inhaled NO. Patient continuing on IABP. Cardiogenic  shock is main limitation to extubation. Plan for daily SBT. Continuing empiric Elita Quick given elevated procalcitonin. Continuing current ventilator settings with further adjustments in cardiac support by Cardiology & CVTS.  I have spent a total of 33 minutes of critical care time today caring for the patient and reviewing the patient's electronic medical record.   Donna Christen Jamison Neighbor, M.D. [facility] Pager:  [phone] After 3pm or if no response, call [phone] 11/05/2015, 10:02 AM

## 2015-11-05 NOTE — Progress Notes (Signed)
TCTS BRIEF SICU PROGRESS NOTE  3 Days Post-Op  S/P Procedure(s) (LRB): Right/Left Heart Cath and Coronary/Graft Angiography (N/A) Intra-Aortic Balloon Pump Insertion   Stable day Sedated on vent AV paced w/ stable hemodynamics on nitric oxide, milrinone and IABP @ 1:1 Os sats 95-99% on 40% FiO2 UOP 175-200 mL/hr  Plan: Continue current plan  Purcell Nails, MD 11/05/2015 7:12 PM

## 2015-11-06 ENCOUNTER — Inpatient Hospital Stay (HOSPITAL_COMMUNITY): Payer: BLUE CROSS/BLUE SHIELD

## 2015-11-06 DIAGNOSIS — I2510 Atherosclerotic heart disease of native coronary artery without angina pectoris: Secondary | ICD-10-CM

## 2015-11-06 DIAGNOSIS — T8111XA Postprocedural  cardiogenic shock, initial encounter: Secondary | ICD-10-CM

## 2015-11-06 LAB — BASIC METABOLIC PANEL
Anion gap: 13 (ref 5–15)
BUN: 91 mg/dL — ABNORMAL HIGH (ref 6–20)
CALCIUM: 8.4 mg/dL — AB (ref 8.9–10.3)
CHLORIDE: 93 mmol/L — AB (ref 101–111)
CO2: 32 mmol/L (ref 22–32)
CREATININE: 3.47 mg/dL — AB (ref 0.61–1.24)
GFR calc non Af Amer: 19 mL/min — ABNORMAL LOW (ref >60)
GFR, EST AFRICAN AMERICAN: 21 mL/min — AB (ref >60)
Glucose, Bld: 135 mg/dL — ABNORMAL HIGH (ref 65–99)
Potassium: 4 mmol/L (ref 3.5–5.1)
SODIUM: 138 mmol/L (ref 135–145)

## 2015-11-06 LAB — POCT I-STAT, CHEM 8
BUN: 94 mg/dL — ABNORMAL HIGH (ref 6–20)
CALCIUM ION: 1.05 mmol/L — AB (ref 1.12–1.23)
Chloride: 90 mmol/L — ABNORMAL LOW (ref 101–111)
Creatinine, Ser: 3.4 mg/dL — ABNORMAL HIGH (ref 0.61–1.24)
GLUCOSE: 136 mg/dL — AB (ref 65–99)
HCT: 28 % — ABNORMAL LOW (ref 39.0–52.0)
HEMOGLOBIN: 9.5 g/dL — AB (ref 13.0–17.0)
Potassium: 4 mmol/L (ref 3.5–5.1)
Sodium: 136 mmol/L (ref 135–145)
TCO2: 33 mmol/L (ref 0–100)

## 2015-11-06 LAB — BLOOD GAS, ARTERIAL
Acid-Base Excess: 8.8 mmol/L — ABNORMAL HIGH (ref 0.0–2.0)
Bicarbonate: 33.6 mEq/L — ABNORMAL HIGH (ref 20.0–24.0)
Drawn by: 252031
FIO2: 0.4
MECHVT: 650 mL
O2 Saturation: 96.3 %
PEEP: 10 cmH2O
Patient temperature: 98.6
RATE: 14 resp/min
TCO2: 35.3 mmol/L (ref 0–100)
pCO2 arterial: 54.1 mmHg — ABNORMAL HIGH (ref 35.0–45.0)
pH, Arterial: 7.41 (ref 7.350–7.450)
pO2, Arterial: 88.9 mmHg (ref 80.0–100.0)

## 2015-11-06 LAB — MAGNESIUM: Magnesium: 2.6 mg/dL — ABNORMAL HIGH (ref 1.7–2.4)

## 2015-11-06 LAB — CARBOXYHEMOGLOBIN
CARBOXYHEMOGLOBIN: 1.5 % (ref 0.5–1.5)
Methemoglobin: 0.9 % (ref 0.0–1.5)
O2 SAT: 71.1 %
Total hemoglobin: 8.6 g/dL — ABNORMAL LOW (ref 13.5–18.0)

## 2015-11-06 LAB — GLUCOSE, CAPILLARY
GLUCOSE-CAPILLARY: 129 mg/dL — AB (ref 65–99)
GLUCOSE-CAPILLARY: 115 mg/dL — AB (ref 65–99)
GLUCOSE-CAPILLARY: 120 mg/dL — AB (ref 65–99)
Glucose-Capillary: 124 mg/dL — ABNORMAL HIGH (ref 65–99)
Glucose-Capillary: 126 mg/dL — ABNORMAL HIGH (ref 65–99)
Glucose-Capillary: 131 mg/dL — ABNORMAL HIGH (ref 65–99)

## 2015-11-06 LAB — HEPARIN LEVEL (UNFRACTIONATED): Heparin Unfractionated: 0.26 IU/mL — ABNORMAL LOW (ref 0.30–0.70)

## 2015-11-06 LAB — CBC
HCT: 27.6 % — ABNORMAL LOW (ref 39.0–52.0)
Hemoglobin: 8.5 g/dL — ABNORMAL LOW (ref 13.0–17.0)
MCH: 28 pg (ref 26.0–34.0)
MCHC: 30.8 g/dL (ref 30.0–36.0)
MCV: 90.8 fL (ref 78.0–100.0)
Platelets: 222 10*3/uL (ref 150–400)
RBC: 3.04 MIL/uL — ABNORMAL LOW (ref 4.22–5.81)
RDW: 17.2 % — ABNORMAL HIGH (ref 11.5–15.5)
WBC: 12.4 10*3/uL — ABNORMAL HIGH (ref 4.0–10.5)

## 2015-11-06 LAB — TYPE AND SCREEN
ABO/RH(D): A POS
Antibody Screen: NEGATIVE
Unit division: 0

## 2015-11-06 LAB — ECHOCARDIOGRAM COMPLETE
Height: 70 in
Weight: 3887.15 oz

## 2015-11-06 LAB — PHOSPHORUS: Phosphorus: 6.1 mg/dL — ABNORMAL HIGH (ref 2.5–4.6)

## 2015-11-06 MED ORDER — POTASSIUM CHLORIDE 10 MEQ/50ML IV SOLN
10.0000 meq | Freq: Once | INTRAVENOUS | Status: AC
  Administered 2015-11-06: 10 meq via INTRAVENOUS
  Filled 2015-11-06: qty 50

## 2015-11-06 MED ORDER — PERFLUTREN LIPID MICROSPHERE
1.0000 mL | INTRAVENOUS | Status: AC | PRN
  Filled 2015-11-06: qty 10

## 2015-11-06 MED ORDER — MIDAZOLAM HCL 2 MG/2ML IJ SOLN
2.0000 mg | INTRAMUSCULAR | Status: DC | PRN
  Administered 2015-11-07: 2 mg via INTRAVENOUS

## 2015-11-06 MED ORDER — SILDENAFIL CITRATE 20 MG PO TABS
20.0000 mg | ORAL_TABLET | Freq: Three times a day (TID) | ORAL | Status: DC
  Administered 2015-11-06 – 2015-11-07 (×6): 20 mg via ORAL
  Filled 2015-11-06 (×9): qty 1

## 2015-11-06 MED ORDER — DEXTROSE 5 % IV SOLN
10.0000 mg/h | INTRAVENOUS | Status: DC
  Administered 2015-11-06: 5 mg/h via INTRAVENOUS
  Administered 2015-11-07: 10 mg/h via INTRAVENOUS
  Administered 2015-11-08 – 2015-11-09 (×2): 20 mg/h via INTRAVENOUS
  Administered 2015-11-09 – 2015-11-10 (×2): 10 mg/h via INTRAVENOUS
  Filled 2015-11-06 (×12): qty 25

## 2015-11-06 MED ORDER — VANCOMYCIN HCL 10 G IV SOLR
1250.0000 mg | INTRAVENOUS | Status: AC
  Administered 2015-11-07 – 2015-11-13 (×4): 1250 mg via INTRAVENOUS
  Filled 2015-11-06 (×4): qty 1250

## 2015-11-06 MED ORDER — ALBUMIN HUMAN 25 % IV SOLN
12.5000 g | Freq: Once | INTRAVENOUS | Status: AC
  Administered 2015-11-06: 12.5 g via INTRAVENOUS
  Filled 2015-11-06: qty 50

## 2015-11-06 NOTE — Progress Notes (Signed)
Echocardiogram 2D Echocardiogram has been performed.  Christopher [name] 11/06/2015, 11:48 AM

## 2015-11-06 NOTE — Progress Notes (Signed)
Patient ID: Christopher [name], male   DOB: 07/14/1961, 55 y.o.   MRN: [id number]   Mr Asencio is a 55 year old with a history of HTN, TIA, CHB, DMII, and marijuana use. On March 14th he presented to [facility] with increased dyspnea. On arrival he had inferior MI and CHB. Urgently transferred to [facility] for cath. LHC Ramus lesion, 70% stenosed, Prox LAD lesion, 95% stenosed, and prox RCA lesion, 100% stenosed. Developed pulmonary edema post cath and was diuresed IV lasix.   Cardiac Surgery consulted---10/27/15 S/P CABG x4 LIMA to LAD, SVG to DIAGONAL, SVG to RAMUS, INTERMEDIATE, SVG to RDA 3 and R CEA. Extubated 3/22. IABP removed 3/23 and remained on dopamine, milrinone, and epi. 3/23 had VF arrest requiring CPR shock x4. Re-intubated. CXR-infiltrate R lung base. Started on milrinone 0.2 mcg, amio drip, norepinephrine. Nitric oxide restarted at 10 ppm.  He had vfib arrest again 3/24 pm and lidocaine added.  VT 3/26-3/27 overnight again, 8 shocks, procainamide added and no further VT.  Stopped norepinephrine but milrinone up to 0.5 still with marginal co-ox.  In effort to decrease milrinone and decrease drive towards VT, he was taken back to cath lab and IABP placed again.  Milrinone decreased to 0.375 then 0.25.    No further VT.  He is now off lidocaine and procainamide.  CVP 9-11 today (lower) but rose to about 20 after NO was stopped.  Creatinine up to 3.47.  Co-ox 71%.  CXR shows improving pulmonary edema and IABP in good position.    ECG done with PPM off 3/26 => atypical atrial flutter with rate about 160 and slow escape rhythm consistent with ongoing complete heart block.   ECHO 10/02/15 EF 25-30% RV normal AK entire anteroseptal and inferoseptal myocardium.  TEE 10/27/2015: EF 35-40% RV mildly dilated TR mod regurg.  ECHO 10/30/15: EF 25-30%  LHC/RHC/IABP placement 3/27 Left Anterior Descending  Native left coronary was not injected to save contrast. LIMA-LAD was patent. SVG-small diagonal was  patent. There was marked size mismatch between graft and artery with resulting slow flow. There appeared to be about 80% stenosis in the diagonal distal to touchdown of graft.      Ramus Intermedius  Native LCA not injected to save contrast. SVG-ramus patent. There was a size mismatch between large vein graft and smaller caliber native artery. There appeared to be 80% stenosis in the ramus distal to the SVG touchdown and 70% stenosis proximal to SVG touchdown.     Left Circumflex  Not visualized, native LCA not injected to save contrast.     Right Coronary Artery  Native RCA known to be occluded, not injected. SVG-PDA patent.       Right Heart Pressures Procedural Findings: Hemodynamics (mmHg), on milrinone 0.5 RA mean 19 RV 44/23 PA 43/25, mean 33 PCWP mean 26 AO 124/71 Oxygen saturations: PA 64% AO 100% Cardiac Output (Fick) 6.52  Cardiac Index (Fick) 2.86 Cardiac Output (Thermo) 4.97 Cardiac Index (Thermo) 2.18    Scheduled Meds: . albumin human  12.5 g Intravenous Once  . amiodarone  150 mg Intravenous Once  . antiseptic oral rinse  7 mL Mouth Rinse QID  . aspirin  325 mg Per Tube Daily  . atorvastatin  80 mg Oral q1800  . bisacodyl  10 mg Oral Daily   Or  . bisacodyl  10 mg Rectal Daily  . chlorhexidine gluconate (SAGE KIT)  15 mL Mouth Rinse BID  . feeding supplement (PRO-STAT SUGAR FREE 64)  30 mL Per Tube TID  . fentaNYL (SUBLIMAZE) injection  25 mcg Intravenous Once  . insulin aspart  0-24 Units Subcutaneous 6 times per day  . insulin detemir  12 Units Subcutaneous BID  . levalbuterol  1.25 mg Nebulization Q6H WA  . midazolam  1 mg Intravenous Once  . pantoprazole (PROTONIX) IV  40 mg Intravenous Daily  . piperacillin-tazobactam (ZOSYN)  IV  3.375 g Intravenous 3 times per day  . potassium chloride  10 mEq Intravenous Once  . potassium chloride  40 mEq Oral 3 times per day  . sennosides  5 mL Per Tube BID  . silver sulfADIAZINE   Topical BID  . sodium  chloride flush  10-40 mL Intracatheter Q12H  . [START ON 11/07/2015] vancomycin  1,250 mg Intravenous Q48H   Continuous Infusions: . sodium chloride 10 mL/hr at 11/06/15 0700  . amiodarone 30 mg/hr (11/06/15 0700)  . feeding supplement (VITAL HIGH PROTEIN) 1,000 mL (11/06/15 0700)  . fentaNYL infusion INTRAVENOUS 200 mcg/hr (11/06/15 0700)  . furosemide (LASIX) infusion    . heparin 1,200 Units/hr (11/06/15 0700)  . midazolam (VERSED) infusion 4 mg/hr (11/06/15 0700)  . milrinone 0.25 mcg/kg/min (11/06/15 0700)  . norepinephrine (LEVOPHED) Adult infusion Stopped (11/01/15 1930)   PRN Meds:.acetaminophen, fentaNYL, metoprolol, ondansetron (ZOFRAN) IV, ondansetron (ZOFRAN) IV, sodium chloride flush, traMADol    Filed Vitals:   11/06/15 0400 11/06/15 0500 11/06/15 0600 11/06/15 0700  BP:      Pulse: 92 92  92  Temp: 98.8 F (37.1 C)   97.5 F (36.4 C)  TempSrc: Oral   Axillary  Resp: _0 Height:      Weight:   242 lb 15.2 oz (110.2 kg)   SpO2: 97% 97%  98%    Intake/Output Summary (Last 24 hours) at 11/06/15 0805 Last data filed at 11/06/15 0700  Gross per 24 hour  Intake 4347.57 ml  Output   3525 ml  Net 822.57 ml    LABS: Basic Metabolic Panel:  Recent Labs  11/05/15 0455 11/05/15 1502 11/06/15 0432  NA 139 137 138  K 4.0 4.7 4.0  CL 93* 93* 93*  CO2 32 32 32  GLUCOSE 121* 126* 135*  BUN 69* 78* 91*  CREATININE 2.87* 3.16* 3.47*  CALCIUM 8.1* 8.2* 8.4*  MG 2.6*  --  2.6*  PHOS 5.2*  --  6.1*   Liver Function Tests:  Recent Labs  11/04/15 0215  AST 85*  ALT 117*  ALKPHOS 74  BILITOT 1.1  PROT 5.8*  ALBUMIN 2.4*   No results for input(s): LIPASE, AMYLASE in the last 72 hours. CBC:  Recent Labs  11/05/15 1502 11/06/15 0432  WBC 12.1* 12.4*  HGB 9.1* 8.5*  HCT 28.6* 27.6*  MCV 91.4 90.8  PLT 229 222   Cardiac Enzymes: No results for input(s): CKTOTAL, CKMB, CKMBINDEX, TROPONINI in the last 72 hours. BNP: Invalid input(s):  POCBNP D-Dimer: No results for input(s): DDIMER in the last 72 hours. Hemoglobin A1C: No results for input(s): HGBA1C in the last 72 hours. Fasting Lipid Panel: No results for input(s): CHOL, HDL, LDLCALC, TRIG, CHOLHDL, LDLDIRECT in the last 72 hours. Thyroid Function Tests: No results for input(s): TSH, T4TOTAL, T3FREE, THYROIDAB in the last 72 hours.  Invalid input(s): FREET3 Anemia Panel: No results for input(s): VITAMINB12, FOLATE, FERRITIN, TIBC, IRON, RETICCTPCT in the last 72 hours.  RADIOLOGY: Dg Chest 2 View  10/23/2015  CLINICAL DATA:  CHF.  Weakness. EXAM: CHEST  2 VIEW COMPARISON:  10/21/2015 FINDINGS: Multiple interstitial densities in the lower chest are suggestive for interstitial edema, right side greater than left. Heart size is upper limits of normal. Trachea is midline. Negative for a pneumothorax. Evidence for a small right pleural effusion. IMPRESSION: Interstitial densities in the lower chest, right side greater than left. Findings are suggestive for interstitial pulmonary edema. Difficult to exclude some airspace disease in the right lower lung. Evidence for small right pleural effusion. Electronically Signed   By: Markus Daft M.D.   On: 10/23/2015 08:27   Dg Abd 1 View  11/03/2015  CLINICAL DATA:  Enteric tube advanced to post pyloric position. EXAM: ABDOMEN - 1 VIEW COMPARISON:  11/02/2015 abdominal radiographs. FINDINGS: Fluoroscopy time 7 minutes. Enteric tube courses through the stomach and duodenum and appears to terminate near the duodenal jejunal junction. Instilled contrast opacifies nondilated proximal jejunal small bowel loops. No contrast leak is demonstrated. Separate enteric tube terminates in the body of the stomach. Visualized lower sternotomy wires appear aligned and intact. IMPRESSION: Enteric tube appears to terminate near the duodenal jejunal junction. Separate enteric tube terminates in the body of the stomach. Electronically Signed   By: Ilona Sorrel  M.D.   On: 11/03/2015 17:02   Dg Chest Port 1 View  11/06/2015  CLINICAL DATA:  Acute CHF secondary to acute MI, complete heart block. EXAM: PORTABLE CHEST 1 VIEW COMPARISON:  Portable chest x-ray of November 05, 2015 FINDINGS: The lungs are adequately inflated. The pulmonary interstitial markings are less prominent on the right today. They have improved on the left as well and the hemidiaphragm is now visible. There is persistent interstitial edema on the left. The cardiac silhouette remains enlarged. The intraaortic balloon pump marker tip projects over the proximal portion of descending thoracic aorta and appears stable. The endotracheal tube tip projects 5.5 cm above the carina. The nasogastric and Doppler AH feeding tubes have their tips below the inferior margin of the image. The right-sided PICC line tip projects over the junction of the middle and distal thirds of the SVC. The left subclavian venous catheter tip projects over the midportion of the SVC. IMPRESSION: Decreasing pulmonary edema consistent with improvement in CHF. Persistent perihilar interstitial edema on the left. The support tubes and devices are in reasonable position. Electronically Signed   By: David  Martinique M.D.   On: 11/06/2015 07:32   Dg Chest Port 1 View  11/05/2015  CLINICAL DATA:  Respiratory failure. EXAM: PORTABLE CHEST 1 VIEW COMPARISON:  11/04/2015 . FINDINGS: Endotracheal tube, NG tube, right PICC line stable position. Prior CABG. Cardiomegaly. Persistent left mid and lower lung field infiltrate. Persistent mild atelectatic changes right upper and right lower lobes. No pneumothorax. IMPRESSION: 1.  Lines and tubes in stable position. 2. Persistent left mid and left lower lung infiltrate, no interim change. Persistent right upper lobe and right lower lobe subsegmental atelectasis. No interim change. 3. Prior CABG.  Stable cardiomegaly. Electronically Signed   By: Marcello Moores  Register   On: 11/05/2015 07:48   Dg Chest Port 1  View  11/04/2015  CLINICAL DATA:  Hypoxia EXAM: PORTABLE CHEST 1 VIEW COMPARISON:  November 04, 2015 FINDINGS: Endotracheal tube tip is 5.3 cm above the carina. Central catheter tip is in the superior vena cava near the cavoatrial junction. Nasogastric tube tip and side port are below the diaphragm. There also is a feeding tube with the tip below the diaphragm. No pneumothorax. There has been removal of a left chest tube. There  is airspace consolidation throughout the left mid and lower lung zones, stable. There is mild atelectasis in the right upper lobe, stable. No new opacity is evident. There is stable cardiomegaly. The pulmonary vascularity is normal. No adenopathy is evident. IMPRESSION: Tube and catheter positions as described without pneumothorax. Airspace consolidation throughout the left mid and lower lung zones, stable. Atelectasis right upper lobe, stable. No new opacity. Stable cardiomegaly. Electronically Signed   By: Lowella Grip III M.D.   On: 11/04/2015 14:52   Dg Chest Port 1 View  11/04/2015  CLINICAL DATA:  Status post CABG 10/28/2015. EXAM: PORTABLE CHEST 1 VIEW COMPARISON:  Single view of the chest 11/03/2015 and 11/02/2015. FINDINGS: Support tubes and lines including a left chest tube are unchanged since the most recent exam. The chest is better expanded today with decreased atelectasis. Airspace opacity in the left mid and lower lung zones persists. There is likely a left pleural effusion. Marked cardiomegaly is seen with only mild vascular congestion noted. IMPRESSION: Increased pulmonary expansion with decreased scattered atelectasis. Left mid and lower lung zone airspace disease could be due to atelectasis or pneumonia. Small left pleural effusion noted. Support tubes and lines projecting good position.  No pneumothorax. Electronically Signed   By: Inge Rise M.D.   On: 11/04/2015 07:30   Dg Chest Port 1 View  11/03/2015  CLINICAL DATA:  Central line placement. EXAM:  PORTABLE CHEST 1 VIEW COMPARISON:  11/03/2015. FINDINGS: Left subclavian central line noted with tip at the cavoatrial junction. Interval removal of right chest tube. Endotracheal tube, NG tube, right PICC line, left IJ sheath left chest tube in stable position. No pneumothorax. Prior CABG. Stable cardiomegaly. Low lung volumes. A mild component of congestive heart failure cannot be excluded. Similar findings noted on prior exam P IMPRESSION: 1. Interim placement of left subclavian central line, its tip is at the cavoatrial junction. Interim removal right chest tube . No pneumothorax. 2. Remaining lines and tubes including left chest tube in stable position. 3. Prior CABG. Stable cardiomegaly. Low lung volumes. A a mild component congestive heart failure cannot be excluded . Similar findings noted on prior exam. Electronically Signed   By: [place]   On: 11/03/2015 09:58   Dg Chest Port 1 View  11/03/2015  CLINICAL DATA:  Status post CABG on October 27, 2015 persistent left lower lobe atelectasis or pneumonia. No pneumothorax or significant pleural effusion. EXAM: PORTABLE CHEST 1 VIEW COMPARISON:  Portable chest x-ray of November 02, 2015 at 5:48 a.m. FINDINGS: There has been interval placement of an intra aortic balloon pump whose marker lies over the junction of the aortic arch with the proximal descending thoracic aorta. The cardiac silhouette remains enlarged. The pulmonary vascularity remains mildly prominent. The retrocardiac region on the left remains dense. Bilateral chest tubes are in stable position. There is no pneumothorax nor significant pleural effusion. The endotracheal tube tip lies approximately 6.5 cm above the carina. The esophagogastric tube tip projects below the inferior margin of the image. The right-sided PICC line tip projects over the midportion of the SVC. A previously demonstrated pericardial drain is not clearly evident on today's study. IMPRESSION: Mild CHF. Persistent left lower  lobe atelectasis or pneumonia. Interval placement of an intra aortic balloon pump which appears to be in appropriate position radiographically. The other support tubes and lines are in reasonable position. Electronically Signed   By: David  Martinique M.D.   On: 11/03/2015 07:27   Dg Chest [facility] 1 [address]  11/02/2015  CLINICAL DATA:  CABG. EXAM: PORTABLE CHEST 1 VIEW COMPARISON:  11/01/2015. FINDINGS: Endotracheal tube, NG tube, right PICC line, bilateral chest tubes in stable position. Prior CABG. Stable cardiomegaly . Persistent mild interstitial prominence, congestive heart failure cannot be excluded. No interim change. Low lung volumes with basilar atelectasis . IMPRESSION: 1. Endotracheal tube, NG tube, right PICC line, bilateral chest tubes in stable position . No pneumothorax. 2. Prior CABG. Cardiomegaly with pulmonary interstitial prominence and small left pleural effusion consistent with mild congestive heart failure. No interim change from prior exam . Electronically Signed   By: Marcello Moores  Register   On: 11/02/2015 07:20   Dg Chest Port 1 View  11/01/2015  CLINICAL DATA:  Post CABG EXAM: PORTABLE CHEST 1 VIEW COMPARISON:  10/31/2015 FINDINGS: Cardiomediastinal silhouette is stable. Status post CABG. Stable endotracheal and NG tube position. Left chest tube is unchanged in position. Persistent central vascular congestion and mild interstitial prominence bilaterally suspicious for the mild interstitial edema. Left IJ sheath in place. Probable small left pleural effusion left basilar atelectasis or infiltrate. Right arm PICC line is unchanged in position. There is no pneumothorax. IMPRESSION: Status post CABG. Stable support apparatus. Stable left chest tube position. No pneumothorax. Again noted bilateral mild interstitial prominence and perihilar opacities suspicious for mild pulmonary edema. Probable small left pleural effusion left basilar atelectasis or infiltrate. Electronically Signed   By: Lahoma Crocker M.D.    On: 11/01/2015 09:57   Dg Chest Port 1 View  10/31/2015  CLINICAL DATA:  55 year old male with a history of endotracheal tube placement Patient has undergone coronary artery bypass grafting x4 10/27/2015. Placement of intra aortic balloon pump at this date. Ischemic cardiomyopathy. EXAM: PORTABLE CHEST 1 VIEW COMPARISON:  10/30/2015. FINDINGS: Endotracheal tube terminates approximately 3.4 cm above the carina, unchanged. Gastric tube terminates out of the field of view. Defibrillator pads project over the left and right chest. Bilateral thoracostomy tubes. Right upper extremity PICC. The mediastinal drain not visualized. Intra-aortic balloon pump not visualized. Surgical changes of median sternotomy and CABG. Mixed bilateral interstitial and airspace opacities. Low lung volumes. IMPRESSION: Low lung volumes with mixed bilateral interstitial and airspace opacities, similar to the comparison chest x-ray. Retrocardiac opacity may reflect persistent pleural fluid, and/or atelectasis. Surgical support apparatus appear unchanged, as above. Intra aortic balloon pump not visualized. Signed, Dulcy Fanny. Earleen Newport, DO [facility] Electronically Signed   By: Corrie Mckusick D.O.   On: 10/31/2015 07:25   Dg Chest Port 1 View  10/30/2015  CLINICAL DATA:  Post cavity.  Recent CABG. EXAM: PORTABLE CHEST 1 VIEW COMPARISON:  Portable film earlier today. FINDINGS: Overlying support apparatus redemonstrated. Tubes and lines remain stable. Elevated LEFT hemidiaphragm. No definite pneumothorax. Moderate vascular congestion may be increased. Cardiomegaly. IMPRESSION: Slight worsening aeration. Moderate vascular congestion may be increased. Electronically Signed   By: Staci Righter M.D.   On: 10/30/2015 18:39   Dg Chest Port 1 View  10/30/2015  CLINICAL DATA:  Status post coronary bypass grafting EXAM: PORTABLE CHEST 1 VIEW COMPARISON:  10/30/2015 FINDINGS: Cardiac shadow  remains enlarged. Bilateral chest tubes are again seen. A right-sided PICC line and endotracheal tube are noted in satisfactory position. Left jugular sheath is noted. Elevation of left hemidiaphragm is noted with left basilar atelectasis. No pneumothorax is noted. IMPRESSION: Postsurgical change with tubes and lines as described. Mild left basilar atelectasis. Electronically Signed   By: Inez Catalina M.D.   On: 10/30/2015 07:45  Dg Chest Port 1 View  10/30/2015  CLINICAL DATA:  A chest tube in place EXAM: PORTABLE CHEST 1 VIEW COMPARISON:  10/30/2015 FINDINGS: Postoperative changes in the mediastinum. Endotracheal tube with tip measuring 6.3 cm about the carina. Right PICC catheter with tip over the cavoatrial junction. Left central venous catheter sheath with tip over the left neck consistent location in the internal jugular vein. Bilateral chest tubes. Infiltration or atelectasis in the right lung base. Cardiac enlargement. No significant vascular congestion. No pneumothorax. IMPRESSION: Appliance positioned as described. Cardiac enlargement with infiltration or atelectasis in the right lung base. Bilateral chest tubes are present but no visualized pneumothorax. Electronically Signed   By: Lucienne Capers M.D.   On: 10/30/2015 03:07   Dg Chest Port 1 View  10/30/2015  CLINICAL DATA:  Shortness of breath.  Code blue. EXAM: PORTABLE CHEST 1 VIEW COMPARISON:  10/29/2015 FINDINGS: Postoperative changes in the mediastinum. Bilateral chest tubes are present. Right PICC line with tip over the cavoatrial junction. Left central venous catheter or sheath with tip over the left side of the neck, likely in the left internal jugular vein. Cardiac enlargement without significant vascular congestion. Shallow inspiration with elevation of the left hemidiaphragm. Probable left pleural effusion. No definite pulmonary consolidation. IMPRESSION: Shallow inspiration. Probable left pleural effusion. Cardiac enlargement.  Appliances appear in satisfactory position. Electronically Signed   By: Lucienne Capers M.D.   On: 10/30/2015 01:18   Dg Chest Port 1 View  10/29/2015  CLINICAL DATA:  Postop CABG 2 days ago. EXAM: PORTABLE CHEST 1 VIEW COMPARISON:  Portable chest x-ray of October 28, 2015 FINDINGS: The trachea and esophagus have been extubated. The cardiac silhouette remains enlarged. The pulmonary vascularity is mildly engorged. The bilateral chest tubes and the mediastinal drain are in stable position. There is no pneumothorax or large pleural effusion. The Swan-Ganz catheter tip overlies a proximal right lower lobe pulmonary artery branch. The mediastinum is widened and accentuated by the hypo inflation. IMPRESSION: Bilateral hypoinflation following extubation of the trachea there is crowding of the pulmonary vascularity. Left basilar atelectasis or less likely pneumonia is more prominent today. Probable small left pleural effusion. The remaining support tubes and lines are in stable position. Electronically Signed   By: David  Martinique M.D.   On: 10/29/2015 07:40   Dg Chest Port 1 View  10/28/2015  CLINICAL DATA:  Post CABG, on ventilator, followup portable chest x-ray of 10/28/2015 EXAM: PORTABLE CHEST 1 VIEW COMPARISON:  None. FINDINGS: The tip of the endotracheal tube is approximately 5.2 cm above the carina. Aeration has improved slightly. Atelectasis remains at the lung bases left-greater-than-right. Swan-Ganz catheter tip is in the right lower lobe pulmonary artery and bilateral chest tubes remain. No pneumothorax is seen. Heart size is stable. IMPRESSION: 1. Improved aeration. 2. Bilateral chest tubes.  No pneumothorax. 3. Tip of endotracheal tube approximately 5.2 cm above the carina. Electronically Signed   By: Ivar Drape M.D.   On: 10/28/2015 08:01   Dg Chest Port 1 View  10/27/2015  CLINICAL DATA:  Status post CABG EXAM: PORTABLE CHEST 1 VIEW COMPARISON:  10/27/2015 FINDINGS: Unchanged tracheostomy tube.  Swan-Ganz central venous line is now seen with tip advanced into the right perihilar region. Stable right chest tube with and left chest tube. NG tube again crosses the gastroesophageal junction. Stable mediastinal drain. Enlarged cardiac silhouette. Limited inspiratory effect with bibasilar opacities likely representing atelectasis. IMPRESSION: Anticipated postoperative appearance with bibasilar atelectasis. Electronically Signed   By: Skipper Cliche  M.D.   On: 10/27/2015 19:25   Dg Chest Portable 1 View  10/27/2015  CLINICAL DATA:  Status post coronary bypass grafting EXAM: PORTABLE CHEST 1 VIEW COMPARISON:  10/23/2015 FINDINGS: Cardiac shadow is mildly enlarged. A Swan-Ganz catheter is noted in the right pulmonary outflow tract. Bilateral thoracostomy catheters are seen. No pneumothorax is noted. An endotracheal tube is noted in satisfactory position. Intra-aortic balloon pump is noted over the aortic arch. Mild left basilar atelectasis is seen. Mild central vascular congestion is noted. IMPRESSION: Postoperative changes with tubes and lines as described above. Mild vascular congestion and left basilar atelectasis. Electronically Signed   By: Inez Catalina M.D.   On: 10/27/2015 17:23   Portable Chest X-ray 1 View  10/21/2015  CLINICAL DATA:  55 year old male with a history of acute systolic congestive heart failure EXAM: PORTABLE CHEST 1 VIEW COMPARISON:  10/20/2015 FINDINGS: Heart size unchanged, enlarged. Similar appearance of low lung volumes with interstitial opacities, interlobular septal thickening, and developing airspace disease of the bilateral lower lungs. No pneumothorax. Linear opacity overlying the mediastinum and the heart border, of on certain significance. Favored to overlie the patient. IMPRESSION: Evidence of worsening congestive heart failure, with increasing edema. New linear opacity overlies the mediastinum, favored to be overlying the patient, though cannot be localized on this view.  Signed, Dulcy Fanny. Earleen Newport, DO [facility] Electronically Signed   By: Corrie Mckusick D.O.   On: 10/21/2015 07:16   Dg Chest Portable 1 View  10/20/2015  CLINICAL DATA:  Shortness of breath. EXAM: PORTABLE CHEST 1 VIEW COMPARISON:  None. FINDINGS: Midline trachea. Cardiomegaly accentuated by AP portable technique. No pleural effusion or pneumothorax. Mildly low lung volumes with bibasilar atelectasis. IMPRESSION: Mild cardiomegaly and low lung volumes.  No acute findings. Electronically Signed   By: Abigail Miyamoto M.D.   On: 10/20/2015 18:09   Dg Abd Portable 1v  11/02/2015  CLINICAL DATA:  Assess feeding tube positioning EXAM: PORTABLE ABDOMEN - 1 VIEW COMPARISON:  None in PACs FINDINGS: The repeated KUB with has less motion artifact. The tip of the feeding tube and nasogastric tube lie in the region of the distal gastric body in the pre-pyloric region. The bowel gas pattern is within the limits of normal. Numerous tubes and lines and electrodes overlie the abdomen. IMPRESSION: The tips of the feeding tube and the esophagogastric tube lie in the region of the distal gastric body in the pre-pyloric region. Electronically Signed   By: David  Martinique M.D.   On: 11/02/2015 12:16   Dg Addison Bailey G Tube Plc W/fl-no Rad  11/03/2015  CLINICAL DATA:  NASO G TUBE PLACEMENT WITH FLUORO Fluoroscopy was utilized by the requesting physician.  No radiographic interpretation.    PHYSICAL EXAM General: Awake on vent Neck: JVP 8-9 cm, no thyromegaly or thyroid nodule.  Lungs: Decreased breath sounds at bases bilaterally.  CV: Nondisplaced PMI.  Heart regular S1/S2, no S3/S4, no murmur.  1+ edema to knees bilaterally. Abdomen: Soft, nontender, no hepatosplenomegaly, no distention.  Neurologic: Alert and follows commands.  Psych: Normal affect. Extremities: No clubbing or cyanosis. Right groin IABP site stable. Dopplerable pedal pulses.   TELEMETRY: Reviewed  telemetry, v-pacing.    ASSESSMENT AND PLAN:  1. CAD: S/p late presentation inferior MI and CABG x 4. Continue statin, ASA. Coronary angiography 3/27 with multiple VT episodes showed patent LIMA-LAD and SVG-RCA.  Patent SVG-ramus and SVG-D but slow flow in both grafts with diffusely diseased small target vessels.  2. Cardiogenic shock: Acute systolic CHF with RV failure post inferior MI with suspected RV involvement. EF 25-30% on echo (difficult images on 3/24 echo). He has significant RV failure from suspected RV infarct.  3/27 IABP placed to try to allow decrease in milrinone with frequent VT.  NO stopped this am.  Good co-ox this morning at 71%.  CVP 9-11 earlier this morning but rose to 20 after stopping NO.  This suggests that he still has significant RV failure.  Creatinine up to 3.47 today.   - Decrease Lasix to 5 mg/hr for now, still has good UOP and creatinine up.  - Discussed with Dr Prescott Gum, will restart NO and will try to wean to sildenafil. - Continue milrinone at 0.25, given good co-ox will decrease IABP to 1:2 today, plan slow wean.  If co-ox remains stable, could consider discontinuation in 24 hours or so.  3. Complete heart block:  Underlying CHB still.   Atrial rhythm actually appears to be an atypical atrial flutter.  He is pacing at 90 bpm. Suspect he will need permanent pacing if he recovers, would use BiV device.   4. VF arrest 3/24 early am and again at night 3/24. VT overnight 3/26 and during the day 3/27.  K now better and on lower milrinone dose.  He is off lidocaine and procainamide.  - Continue amiodarone.  - K is better now, watch carefully throughout day and replete as needed.  - If recovers, will need CRT-D.  5. AKI: Creatinine up again today.  Got minimal contrast (30 cc) with cath on 3/27.  Lower Lasix dose today with rise. 6. ID: Possible LLL PNA, elevated PCT.  He is on vanco/Zosyn.    40 minutes critical care time.   Loralie Champagne 11/06/2015 8:05  AM

## 2015-11-06 NOTE — Progress Notes (Signed)
PULMONARY / CRITICAL CARE MEDICINE   Name: Christopher [name] MRN: [id number] DOB: 25-Apr-1961    ADMISSION DATE:  10/20/2015 CONSULTATION DATE:  11/03/2015  REFERRING MD: Dr. Donata Clay / CVTS  CHIEF COMPLAINT:  Acute Hypoxic Respiratory Failure  HISTORY OF PRESENT ILLNESS:   [name] w/ ischemic cardiomyopathy following MI. Patient underwent CABG & CEA. Post-operative course complicated by arrhythmia and biventricular failure. Continues to have IABP in place. Requiring NO inhaled on ventilator. Continuing on Primacor and diuretics. Patient underwent remove of chest tubes today and replacement of IABP.   SUBJECTIVE: Inhaled NO started again today. Lasix drip decreased by cardiology given worsening renal failure.   REVIEW OF SYSTEMS:  Unable to obtain given intubation & sedation.  VITAL SIGNS: BP 108/53 mmHg  Pulse 46  Temp(Src) 97.5 F (36.4 C) (Oral)  Resp 13  Ht 5\' 10"  (1.778 m)  Wt 242 lb 15.2 oz (110.2 kg)  BMI 34.86 kg/m2  SpO2 99%  HEMODYNAMICS: CVP:  [9 mmHg-20 mmHg] 20 mmHg  VENTILATOR SETTINGS: Vent Mode:  [-] PRVC FiO2 (%):  [40 %-50 %] 40 % Set Rate:  [14 bmp] 14 bmp Vt Set:  [650 mL] 650 mL PEEP:  [10 cmH20] 10 cmH20 Plateau Pressure:  [19 cmH20-26 cmH20] 26 cmH20  INTAKE / OUTPUT: I/O last 3 completed shifts: In: 7001.8 [I.V.:3186.8; Blood:335; NG/GT:2480; IV Piggyback:1000] Out: 6180 [Urine:5580; Emesis/NG output:600]  PHYSICAL EXAMINATION: General:  Comfortable. Sedated. No family at bedside. Neuro:  Sedated. Pupils equal. HEENT:  ETT in place. No scleral icterus or injection. Cardiovascular:  Regular rate & rhythm. No appreciable JVD. Lower extremity edema persists & unchanged. Lungs:  Coarse breath sounds bilaterally. Symmetric chest rise on ventilator. Abdomen:  Soft. Protuberant. Normal BS. Integument:  Warm & dry. No rash on exposed skin.  LABS:  BMET  Recent Labs Lab 11/05/15 0455 11/05/15 1502 11/06/15 0432  NA 139 137 138  K 4.0  4.7 4.0  CL 93* 93* 93*  CO2 32 32 32  BUN 69* 78* 91*  CREATININE 2.87* 3.16* 3.47*  GLUCOSE 121* 126* 135*    Electrolytes  Recent Labs Lab 11/04/15 0215  11/05/15 0455 11/05/15 1502 11/06/15 0432  CALCIUM 8.1*  < > 8.1* 8.2* 8.4*  MG 2.6*  --  2.6*  --  2.6*  PHOS 4.5  --  5.2*  --  6.1*  < > = values in this interval not displayed.  CBC  Recent Labs Lab 11/05/15 0455 11/05/15 1502 11/06/15 0432  WBC 10.3 12.1* 12.4*  HGB 8.1* 9.1* 8.5*  HCT 26.4* 28.6* 27.6*  PLT 225 229 222    Coag's No results for input(s): APTT, INR in the last 168 hours.  Sepsis Markers  Recent Labs Lab 11/03/15 1154 11/04/15 0215 11/05/15 0455  PROCALCITON 1.75 1.50 1.17    ABG  Recent Labs Lab 11/04/15 2047 11/05/15 0404 11/06/15 0316  PHART 7.441 7.441 7.410  PCO2ART 56.2* 57.1* 54.1*  PO2ART 192.0* 294.0* 88.9    Liver Enzymes  Recent Labs Lab 11/02/15 0020 11/03/15 0345 11/04/15 0215  AST 144* 101* 85*  ALT 156* 139* 117*  ALKPHOS 70 73 74  BILITOT 1.6* 1.3* 1.1  ALBUMIN 2.6* 2.4* 2.4*    Cardiac Enzymes  Recent Labs Lab 11/02/15 0748 11/02/15 1535 11/02/15 2000  TROPONINI 1.12* 1.15* 1.12*    Glucose  Recent Labs Lab 11/05/15 1315 11/05/15 1524 11/05/15 1919 11/05/15 2344 11/06/15 0343 11/06/15 0743  GLUCAP 111* 109* 106* 126* 131* 129*  Imaging Dg Chest Port 1 View  11/06/2015  CLINICAL DATA:  Acute CHF secondary to acute MI, complete heart block. EXAM: PORTABLE CHEST 1 VIEW COMPARISON:  Portable chest x-ray of November 05, 2015 FINDINGS: The lungs are adequately inflated. The pulmonary interstitial markings are less prominent on the right today. They have improved on the left as well and the hemidiaphragm is now visible. There is persistent interstitial edema on the left. The cardiac silhouette remains enlarged. The intraaortic balloon pump marker tip projects over the proximal portion of descending thoracic aorta and appears stable. The  endotracheal tube tip projects 5.5 cm above the carina. The nasogastric and Doppler AH feeding tubes have their tips below the inferior margin of the image. The right-sided PICC line tip projects over the junction of the middle and distal thirds of the SVC. The left subclavian venous catheter tip projects over the midportion of the SVC. IMPRESSION: Decreasing pulmonary edema consistent with improvement in CHF. Persistent perihilar interstitial edema on the left. The support tubes and devices are in reasonable position. Electronically Signed   By: David  [place] M.D.   On: 11/06/2015 07:32     STUDIES:  Port CXR 3/28:  Previously reviewed by me. R PICC in good psition. ETT >5cm above carina. New L [place] CVL. L lung opacity unchanged. Low lung volumes. L chest tube in place. Port CXR 3/30:  Personally reviewed by me.  MICROBIOLOGY: Tracheal Asp Ctx 3/27:  Oral Flora Urine Ctx 3/24:  Negative MRSA PCR 3/20:  Negative MRSA PCR 3/14:  Negative  ANTIBIOTICS: Zosyn 3/29>>> Vancomcyin 3/21 - 3/22; 3/29>>> Cefuroxime 3/21 - 3/23 (periop prophylaxis) Elita Quick 3/27 - 3/29  SIGNIFICANT EVENTS: 3/14 - Admit 3/14 - LHC 3/21 - CABG 3/27 - IABP replaced 3/28 - Chest Tubes removed. RHC & LHC.  LINES/TUBES: OETT 7.5 Extubated 3/22; 3.24>>> L Chest Tube>>> L [place] CVL 3/28>>> L IJ CVL 3/21>>> R PICC 3/23>>> IABP 3/27>>> R Femoral Art Sheath 3/27>>> L Radial Art Line 3/21>>> NGT R Nare 3/27>>> OGT 3/24>>> Foley 3/22>>>  ASSESSMENT / PLAN:  PULMONARY A: Acute Hypoxic Respiratory Failure Chronic Hypercarbic Respiratory Failure - OSA/OHS vs COPD. L Chest Tube  P:   Full Vent Support Continue Xopenex q6hr Holding SBT Inh NO 12ppm  CARDIOVASCULAR A:  S/P CABG & CEA Left Ventricular Heart Failure post MI Cardiogenic Shock Periop Complete Heart Block & Post-op V Tach H/O HTN  P:  CVTS & Cardiology Managing ASA VT Amiodarone gtt Primacor gtt Lasix gtt Heparin gtt Levophed gtt to  maintain MAP IABP Transitioning from Inh NO to Revatio per notes  RENAL A:   Acute Renal Failure  P:   Trending UOP with Foley Electrolytes & Renal Function daily Replacing electrolytes as indicated  GASTROINTESTINAL A:   Transaminitis - Likely shock liver/hepatic congestion. Constipation  P:   Trending LFTs daily Continuing TFs Protonix IV daily Dulcolax PR prn Senna bid  HEMATOLOGIC A:   Anemia - Hgb worsening. No signs of active bleeding.  P:  Transfusion PRBC ordered Heparin gtt Trending cell counts daily w/ CBC  INFECTIOUS A:   Possible HCAP - Ctx w/ Oral Flora.  P:   Switched from [place] on 3/29 w/ acute decompensation Zosyn /Vancomycin Day #2 Plan to re-culture for fever  ENDOCRINE A:   Hyperglycemia - BG controlled.  P:   Continue Levemir bid Accu-checks q4hr SSI per algorithm  NEUROLOGIC A:   Sedation on Ventilator Post-op Pain Control   P:   RASS goal: -1  to -2 Versed gtt & IV prn Fentanyl gtt & IV prn  FAMILY UPDATES: Significant other updated at bedside 3/30 by Dr. Jamison Neighbor.  TODAY'S SUMMARY:  20 year old s/p CABG & CEA. Continuing in cardiogenic shock. On inhaled NO. Patient continuing on IABP. Cardiogenic shock is main limitation to extubation. Successfully weaning FiO2. Continuing current empiric antibiotics & consider D/C vancomycin in next 24-48 hours.   I have spent a total of 34 minutes of critical care time today caring for the patient and reviewing the patient's electronic medical record.   Donna Christen Jamison Neighbor, M.D. [facility] Pulmonary & Critical Care Pager:  [phone] After 3pm or if no response, call [phone] 11/06/2015, 10:44 AM

## 2015-11-06 NOTE — Progress Notes (Signed)
4 Days Post-Op Procedure(s) (LRB): Right/Left Heart Cath and Coronary/Graft Angiography (N/A) Intra-Aortic Balloon Pump Insertion Subjective: Patient presented with completed BMI and RV infarct after proximal closure RCA. He had preoperative heart failure and heart block. After multivessel CABG the patient has had persistent biventricular failure and required reintubation for multiple episodes of V. Tach requiring cardioversion. A balloon pump was replaced on postop day 5 in the cath lab and graft injection she demonstrated all grafts patent. Echocardiogram performed today shows significant improvement and biventricular function after 4 days of balloon pump support. He has been hemodynamically stable without episodes of V. Tach requiring cardioversion for 72 hours. Balloon pump has been weaned to 1-2 CCM following ventilator management and plans for extubation Postpyloric feeding tube is in place and is providing nutrition Patient is on empiric antibiotics for left lower lobe infiltrate and thick secretions from airway He remains A-V paced for underlying heart block Objective: Vital signs in last 24 hours: Temp:  [97 F (36.1 C)-98.8 F (37.1 C)] 97.4 F (36.3 C) (03/31 1530) Pulse Rate:  [44-93] 92 (03/31 1645) Cardiac Rhythm:  [-] A-V Sequential paced (03/31 1600) Resp:  [11-20] 15 (03/31 1645) BP: (108)/(53) 108/53 mmHg (03/30 2301) SpO2:  [94 %-100 %] 96 % (03/31 1645) Arterial Line BP: (109-146)/(52-71) 116/52 mmHg (03/31 1645) FiO2 (%):  [40 %] 40 % (03/31 1600) Weight:  [242 lb 15.2 oz (110.2 kg)] 242 lb 15.2 oz (110.2 kg) (03/31 0600)  Hemodynamic parameters for last 24 hours: CVP:  [9 mmHg-20 mmHg] 20 mmHg  Intake/Output from previous day: 03/30 0701 - 03/31 0700 In: 4601 [I.V.:2066; Blood:335; NG/GT:1650; IV Piggyback:550] Out: 3750 [Urine:3350; Emesis/NG output:400] Intake/Output this shift: Total I/O In: 1729.3 [I.V.:789.3; NG/GT:840; IV Piggyback:100] Out: 1225  [Urine:1125; Emesis/NG output:100]  Scattered rhonchi Patient alert neuro intact but sedated Extremities warm with 2+ edema Abdomen soft Sternal incision intact  Lab Results:  Recent Labs  11/05/15 1502 11/06/15 0432 11/06/15 1633  WBC 12.1* 12.4*  --   HGB 9.1* 8.5* 9.5*  HCT 28.6* 27.6* 28.0*  PLT 229 222  --    BMET:  Recent Labs  11/05/15 1502 11/06/15 0432 11/06/15 1633  NA 137 138 136  K 4.7 4.0 4.0  CL 93* 93* 90*  CO2 32 32  --   GLUCOSE 126* 135* 136*  BUN 78* 91* 94*  CREATININE 3.16* 3.47* 3.40*  CALCIUM 8.2* 8.4*  --     PT/INR: No results for input(s): LABPROT, INR in the last 72 hours. ABG    Component Value Date/Time   PHART 7.410 11/06/2015 0316   HCO3 33.6* 11/06/2015 0316   TCO2 33 11/06/2015 1633   ACIDBASEDEF TEST WILL BE CREDITED 11/01/2015 0446   O2SAT 96.3 11/06/2015 0316   CBG (last 3)   Recent Labs  11/06/15 0743 11/06/15 1234 11/06/15 1543  GLUCAP 129* 115* 120*    Assessment/Plan: S/P Procedure(s) (LRB): Right/Left Heart Cath and Coronary/Graft Angiography (N/A) Intra-Aortic Balloon Pump Insertion Slow wean of balloon pump and probable that wean Continue IV heparin while balloon pump in place maximum dose 1200 units per hour Empiric antibiotics   LOS: 17 days    Kathlee Nations Trigt III 11/06/2015

## 2015-11-06 NOTE — Progress Notes (Signed)
ANTICOAGULATION CONSULT NOTE - Follow Up Consult  Pharmacy Consult for Heparin Indication: IABP  Allergies  Allergen Reactions  . Losartan Shortness Of Breath  . Lisinopril Rash    Patient Measurements: Height: 5\' 10"  (177.8 cm) Weight: 242 lb 15.2 oz (110.2 kg) IBW/kg (Calculated) : 73 Heparin Dosing Weight: 99kg  Vital Signs: Temp: 97.5 F (36.4 C) (03/31 0800) Temp Source: Oral (03/31 0800) BP: 108/53 mmHg (03/30 2301) Pulse Rate: 46 (03/31 0915)  Labs:  Recent Labs  11/04/15 0215  11/05/15 0455 11/05/15 0500 11/05/15 1502 11/06/15 0432  HGB 8.7*  < > 8.1*  --  9.1* 8.5*  HCT 27.9*  < > 26.4*  --  28.6* 27.6*  PLT 217  --  225  --  229 222  HEPARINUNFRC 0.25*  --   --  0.26*  --  0.26*  CREATININE 2.37*  < > 2.87*  --  3.16* 3.47*  < > = values in this interval not displayed.  Estimated Creatinine Clearance: 30.3 mL/min (by C-G formula based on Cr of 3.47).   Medications:  Heparin @ 1200 units/hr  Assessment: 55yom continues on heparin for IABP. Heparin level is therapeutic at 0.26. Hgb dropped 9.5 to 8.1 yesterday and he received blood - Hgb 8.5 today. No signs of active bleeding.  Goal of Therapy:  Heparin level 0.2-0.5 units/ml Monitor platelets by anticoagulation protocol: Yes   Plan:  1) Continue heparin at 1200 units/hr 2) Heparin level, CBC in AM   Louie Casa, PharmD, BCPS  11/06/2015 10:25 AM

## 2015-11-07 DIAGNOSIS — I509 Heart failure, unspecified: Secondary | ICD-10-CM

## 2015-11-07 DIAGNOSIS — I959 Hypotension, unspecified: Secondary | ICD-10-CM

## 2015-11-07 LAB — COMPREHENSIVE METABOLIC PANEL
ALT: 57 U/L (ref 17–63)
AST: 45 U/L — ABNORMAL HIGH (ref 15–41)
Albumin: 2.7 g/dL — ABNORMAL LOW (ref 3.5–5.0)
Alkaline Phosphatase: 86 U/L (ref 38–126)
Anion gap: 14 (ref 5–15)
BUN: 106 mg/dL — ABNORMAL HIGH (ref 6–20)
CO2: 31 mmol/L (ref 22–32)
Calcium: 8.6 mg/dL — ABNORMAL LOW (ref 8.9–10.3)
Chloride: 93 mmol/L — ABNORMAL LOW (ref 101–111)
Creatinine, Ser: 3.54 mg/dL — ABNORMAL HIGH (ref 0.61–1.24)
GFR calc Af Amer: 21 mL/min — ABNORMAL LOW (ref >60)
GFR calc non Af Amer: 18 mL/min — ABNORMAL LOW (ref >60)
Glucose, Bld: 131 mg/dL — ABNORMAL HIGH (ref 65–99)
Potassium: 3.9 mmol/L (ref 3.5–5.1)
Sodium: 138 mmol/L (ref 135–145)
Total Bilirubin: 1.4 mg/dL — ABNORMAL HIGH (ref 0.3–1.2)
Total Protein: 6.4 g/dL — ABNORMAL LOW (ref 6.5–8.1)

## 2015-11-07 LAB — BLOOD GAS, ARTERIAL
Acid-Base Excess: 8.8 mmol/L — ABNORMAL HIGH (ref 0.0–2.0)
Bicarbonate: 33.3 mEq/L — ABNORMAL HIGH (ref 20.0–24.0)
Drawn by: 252031
FIO2: 0.4
MECHVT: 650 mL
O2 Saturation: 89.6 %
PEEP: 10 cmH2O
Patient temperature: 98.6
RATE: 14 resp/min
TCO2: 34.9 mmol/L (ref 0–100)
pCO2 arterial: 50.6 mmHg — ABNORMAL HIGH (ref 35.0–45.0)
pH, Arterial: 7.434 (ref 7.350–7.450)
pO2, Arterial: 59.7 mmHg — ABNORMAL LOW (ref 80.0–100.0)

## 2015-11-07 LAB — CBC
HCT: 28.1 % — ABNORMAL LOW (ref 39.0–52.0)
Hemoglobin: 8.6 g/dL — ABNORMAL LOW (ref 13.0–17.0)
MCH: 27.8 pg (ref 26.0–34.0)
MCHC: 30.6 g/dL (ref 30.0–36.0)
MCV: 90.9 fL (ref 78.0–100.0)
Platelets: 234 10*3/uL (ref 150–400)
RBC: 3.09 MIL/uL — ABNORMAL LOW (ref 4.22–5.81)
RDW: 17 % — ABNORMAL HIGH (ref 11.5–15.5)
WBC: 13.5 10*3/uL — ABNORMAL HIGH (ref 4.0–10.5)

## 2015-11-07 LAB — GLUCOSE, CAPILLARY
GLUCOSE-CAPILLARY: 103 mg/dL — AB (ref 65–99)
GLUCOSE-CAPILLARY: 111 mg/dL — AB (ref 65–99)
GLUCOSE-CAPILLARY: 127 mg/dL — AB (ref 65–99)
Glucose-Capillary: 125 mg/dL — ABNORMAL HIGH (ref 65–99)
Glucose-Capillary: 126 mg/dL — ABNORMAL HIGH (ref 65–99)

## 2015-11-07 LAB — PHOSPHORUS: Phosphorus: 6 mg/dL — ABNORMAL HIGH (ref 2.5–4.6)

## 2015-11-07 LAB — MAGNESIUM: Magnesium: 2.7 mg/dL — ABNORMAL HIGH (ref 1.7–2.4)

## 2015-11-07 LAB — HEPARIN LEVEL (UNFRACTIONATED): Heparin Unfractionated: 0.23 IU/mL — ABNORMAL LOW (ref 0.30–0.70)

## 2015-11-07 LAB — VANCOMYCIN, TROUGH: Vancomycin Tr: 20 ug/mL (ref 10.0–20.0)

## 2015-11-07 LAB — CARBOXYHEMOGLOBIN
Carboxyhemoglobin: 1.7 % — ABNORMAL HIGH (ref 0.5–1.5)
Methemoglobin: 0.8 % (ref 0.0–1.5)
O2 Saturation: 61.5 %
Total hemoglobin: 8.9 g/dL — ABNORMAL LOW (ref 13.5–18.0)

## 2015-11-07 MED ORDER — LEVOFLOXACIN IN D5W 750 MG/150ML IV SOLN
750.0000 mg | INTRAVENOUS | Status: AC
  Administered 2015-11-07 – 2015-11-13 (×4): 750 mg via INTRAVENOUS
  Filled 2015-11-07 (×4): qty 150

## 2015-11-07 MED ORDER — MIDAZOLAM HCL 2 MG/2ML IJ SOLN
1.0000 mg | INTRAMUSCULAR | Status: DC | PRN
  Administered 2015-11-17: 1 mg via INTRAVENOUS
  Administered 2015-11-17 – 2015-11-18 (×2): 2 mg via INTRAVENOUS
  Filled 2015-11-07 (×4): qty 2

## 2015-11-07 NOTE — Progress Notes (Signed)
ANTICOAGULATION CONSULT NOTE - Follow Up Consult  Pharmacy Consult for Heparin Indication: IABP  Allergies  Allergen Reactions  . Losartan Shortness Of Breath  . Lisinopril Rash    Patient Measurements: Height: 5\' 10"  (177.8 cm) Weight: 250 lb 3.6 oz (113.5 kg) IBW/kg (Calculated) : 73 Heparin Dosing Weight: 99kg  Vital Signs: Temp: 97.8 F (36.6 C) (04/01 0857) Temp Source: Oral (04/01 0857) BP: 115/58 mmHg (04/01 0839) Pulse Rate: 92 (04/01 1000)  Labs:  Recent Labs  11/05/15 0500 11/05/15 1502 11/06/15 0432 11/06/15 1633 11/07/15 0337  HGB  --  9.1* 8.5* 9.5* 8.6*  HCT  --  28.6* 27.6* 28.0* 28.1*  PLT  --  229 222  --  234  HEPARINUNFRC 0.26*  --  0.26*  --  0.23*  CREATININE  --  3.16* 3.47* 3.40* 3.54*    Estimated Creatinine Clearance: 30.1 mL/min (by C-G formula based on Cr of 3.54).   Medications:  Heparin @ 1200 units/h (max rate per MD)  Assessment: 55yom continues on heparin for IABP. Heparin level is therapeutic at 0.23. Received blood 3/31- Hgb 8.6 stable today, plt wnl. No signs of active bleeding.  Goal of Therapy:  Heparin level 0.2-0.5 units/ml Monitor platelets by anticoagulation protocol: Yes   Plan:  Continue heparin at 1200 units/hr Daily HL/CBC Monitor s/sx bleeding   Babs Bertin, PharmD, [facility] Clinical Pharmacist Pager [phone] 11/07/2015 11:29 AM

## 2015-11-07 NOTE — Progress Notes (Signed)
Patient ID: Christopher [name], male   DOB: November 21, 1960, 55 y.o.   MRN: [id number]   Christopher [name] is a 55 year old with a history of HTN, TIA, CHB, DMII, and marijuana use. On March 14th he presented to [facility] with increased dyspnea. On arrival he had inferior MI and CHB. Urgently transferred to [facility] for cath. LHC Ramus lesion, 70% stenosed, Prox LAD lesion, 95% stenosed, and prox RCA lesion, 100% stenosed. Developed pulmonary edema post cath and was diuresed IV lasix.   Cardiac Surgery consulted---10/27/15 S/P CABG x4 LIMA to LAD, SVG to DIAGONAL, SVG to RAMUS, INTERMEDIATE, SVG to RDA 3 and R CEA. Extubated 3/22. IABP removed 3/23 and remained on dopamine, milrinone, and epi. 3/23 had VF arrest requiring CPR shock x4. Re-intubated. CXR-infiltrate R lung base. Started on milrinone 0.2 mcg, amio drip, norepinephrine. Nitric oxide restarted at 10 ppm.  He had vfib arrest again 3/24 pm and lidocaine added.  VT 3/26-3/27 overnight again, 8 shocks, procainamide added and no further VT.  Stopped norepinephrine but milrinone up to 0.5 still with marginal co-ox.  In effort to decrease milrinone and decrease drive towards VT, he was taken back to cath lab and IABP placed again.  Milrinone decreased to 0.375 then 0.25.    No further VT.  He is now off lidocaine and procainamide.  Remains intubated but is awake. On IABP. Milrinone 0.25. Creatinine 3.4-> 3.5. Lasix cut to 5/hr. Peeing 100/hr but weight going up.    ECHO 10/02/15 EF 25-30% RV normal AK entire anteroseptal and inferoseptal myocardium.  TEE 10/27/2015: EF 35-40% RV mildly dilated TR mod regurg.  ECHO 10/30/15: EF 25-30%  LHC/RHC/IABP placement 3/27 Left Anterior Descending  Native left coronary was not injected to save contrast. LIMA-LAD was patent. SVG-small diagonal was patent. There was marked size mismatch between graft and artery with resulting slow flow. There appeared to be about 80% stenosis in the diagonal distal to touchdown of graft.        Ramus Intermedius  Native LCA not injected to save contrast. SVG-ramus patent. There was a size mismatch between large vein graft and smaller caliber native artery. There appeared to be 80% stenosis in the ramus distal to the SVG touchdown and 70% stenosis proximal to SVG touchdown.     Left Circumflex  Not visualized, native LCA not injected to save contrast.     Right Coronary Artery  Native RCA known to be occluded, not injected. SVG-PDA patent.       Right Heart Pressures Procedural Findings: Hemodynamics (mmHg), on milrinone 0.5 RA mean 19 RV 44/23 PA 43/25, mean 33 PCWP mean 26 AO 124/71 Oxygen saturations: PA 64% AO 100% Cardiac Output (Fick) 6.52  Cardiac Index (Fick) 2.86 Cardiac Output (Thermo) 4.97 Cardiac Index (Thermo) 2.18    Scheduled Meds: . amiodarone  150 mg Intravenous Once  . antiseptic oral rinse  7 mL Mouth Rinse QID  . aspirin  325 mg Per Tube Daily  . atorvastatin  80 mg Oral q1800  . bisacodyl  10 mg Oral Daily   Or  . bisacodyl  10 mg Rectal Daily  . chlorhexidine gluconate (SAGE KIT)  15 mL Mouth Rinse BID  . feeding supplement (PRO-STAT SUGAR FREE 64)  30 mL Per Tube TID  . fentaNYL (SUBLIMAZE) injection  25 mcg Intravenous Once  . insulin aspart  0-24 Units Subcutaneous 6 times per day  . insulin detemir  12 Units Subcutaneous BID  . levalbuterol  1.25 mg Nebulization Q6H  WA  . midazolam  1 mg Intravenous Once  . pantoprazole (PROTONIX) IV  40 mg Intravenous Daily  . piperacillin-tazobactam (ZOSYN)  IV  3.375 g Intravenous 3 times per day  . potassium chloride  40 mEq Oral 3 times per day  . sennosides  5 mL Per Tube BID  . sildenafil  20 mg Oral TID  . silver sulfADIAZINE   Topical BID  . sodium chloride flush  10-40 mL Intracatheter Q12H  . vancomycin  1,250 mg Intravenous Q48H   Continuous Infusions: . sodium chloride 10 mL/hr at 11/07/15 0635  . amiodarone 30 mg/hr (11/07/15 0630)  . feeding supplement (VITAL HIGH PROTEIN)  1,000 mL (11/07/15 0600)  . fentaNYL infusion INTRAVENOUS 200 mcg/hr (11/07/15 0641)  . furosemide (LASIX) infusion 5 mg/hr (11/07/15 0600)  . heparin 1,200 Units/hr (11/07/15 0600)  . midazolam (VERSED) infusion 4 mg/hr (11/07/15 0630)  . milrinone 0.25 mcg/kg/min (11/07/15 0600)  . norepinephrine (LEVOPHED) Adult infusion Stopped (11/01/15 1930)   PRN Meds:.acetaminophen, fentaNYL, metoprolol, midazolam, ondansetron (ZOFRAN) IV, ondansetron (ZOFRAN) IV, sodium chloride flush, traMADol    Filed Vitals:   11/07/15 0400 11/07/15 0410 11/07/15 0500 11/07/15 0600  BP:      Pulse: 92  92 92  Temp:  98.2 F (36.8 C)    TempSrc:  Axillary    Resp: _0 Height:      Weight:   113.5 kg (250 lb 3.6 oz)   SpO2: 95%  96% 98%    Intake/Output Summary (Last 24 hours) at 11/07/15 0832 Last data filed at 11/07/15 0700  Gross per 24 hour  Intake 3573.3 ml  Output   2540 ml  Net 1033.3 ml    LABS: Basic Metabolic Panel:  Recent Labs  11/06/15 0432 11/06/15 1633 11/07/15 0337  NA 138 136 138  K 4.0 4.0 3.9  CL 93* 90* 93*  CO2 32  --  31  GLUCOSE 135* 136* 131*  BUN 91* 94* 106*  CREATININE 3.47* 3.40* 3.54*  CALCIUM 8.4*  --  8.6*  MG 2.6*  --  2.7*  PHOS 6.1*  --  6.0*   Liver Function Tests:  Recent Labs  11/07/15 0337  AST 45*  ALT 57  ALKPHOS 86  BILITOT 1.4*  PROT 6.4*  ALBUMIN 2.7*   No results for input(s): LIPASE, AMYLASE in the last 72 hours. CBC:  Recent Labs  11/06/15 0432 11/06/15 1633 11/07/15 0337  WBC 12.4*  --  13.5*  HGB 8.5* 9.5* 8.6*  HCT 27.6* 28.0* 28.1*  MCV 90.8  --  90.9  PLT 222  --  234   Cardiac Enzymes: No results for input(s): CKTOTAL, CKMB, CKMBINDEX, TROPONINI in the last 72 hours. BNP: Invalid input(s): POCBNP D-Dimer: No results for input(s): DDIMER in the last 72 hours. Hemoglobin A1C: No results for input(s): HGBA1C in the last 72 hours. Fasting Lipid Panel: No results for input(s): CHOL, HDL, LDLCALC,  TRIG, CHOLHDL, LDLDIRECT in the last 72 hours. Thyroid Function Tests: No results for input(s): TSH, T4TOTAL, T3FREE, THYROIDAB in the last 72 hours.  Invalid input(s): FREET3 Anemia Panel: No results for input(s): VITAMINB12, FOLATE, FERRITIN, TIBC, IRON, RETICCTPCT in the last 72 hours.  RADIOLOGY: Dg Chest 2 View  10/23/2015  CLINICAL DATA:  CHF.  Weakness. EXAM: CHEST  2 VIEW COMPARISON:  10/21/2015 FINDINGS: Multiple interstitial densities in the lower chest are suggestive for interstitial edema, right side greater than left. Heart size is upper limits of normal.  Trachea is midline. Negative for a pneumothorax. Evidence for a small right pleural effusion. IMPRESSION: Interstitial densities in the lower chest, right side greater than left. Findings are suggestive for interstitial pulmonary edema. Difficult to exclude some airspace disease in the right lower lung. Evidence for small right pleural effusion. Electronically Signed   By: Markus Daft M.D.   On: 10/23/2015 08:27   Dg Abd 1 View  11/03/2015  CLINICAL DATA:  Enteric tube advanced to post pyloric position. EXAM: ABDOMEN - 1 VIEW COMPARISON:  11/02/2015 abdominal radiographs. FINDINGS: Fluoroscopy time 7 minutes. Enteric tube courses through the stomach and duodenum and appears to terminate near the duodenal jejunal junction. Instilled contrast opacifies nondilated proximal jejunal small bowel loops. No contrast leak is demonstrated. Separate enteric tube terminates in the body of the stomach. Visualized lower sternotomy wires appear aligned and intact. IMPRESSION: Enteric tube appears to terminate near the duodenal jejunal junction. Separate enteric tube terminates in the body of the stomach. Electronically Signed   By: Ilona Sorrel M.D.   On: 11/03/2015 17:02   Dg Chest Port 1 View  11/06/2015  CLINICAL DATA:  Acute CHF secondary to acute MI, complete heart block. EXAM: PORTABLE CHEST 1 VIEW COMPARISON:  Portable chest x-ray of November 05, 2015 FINDINGS: The lungs are adequately inflated. The pulmonary interstitial markings are less prominent on the right today. They have improved on the left as well and the hemidiaphragm is now visible. There is persistent interstitial edema on the left. The cardiac silhouette remains enlarged. The intraaortic balloon pump marker tip projects over the proximal portion of descending thoracic aorta and appears stable. The endotracheal tube tip projects 5.5 cm above the carina. The nasogastric and Doppler AH feeding tubes have their tips below the inferior margin of the image. The right-sided PICC line tip projects over the junction of the middle and distal thirds of the SVC. The left subclavian venous catheter tip projects over the midportion of the SVC. IMPRESSION: Decreasing pulmonary edema consistent with improvement in CHF. Persistent perihilar interstitial edema on the left. The support tubes and devices are in reasonable position. Electronically Signed   By: David  Martinique M.D.   On: 11/06/2015 07:32   Dg Chest Port 1 View  11/05/2015  CLINICAL DATA:  Respiratory failure. EXAM: PORTABLE CHEST 1 VIEW COMPARISON:  11/04/2015 . FINDINGS: Endotracheal tube, NG tube, right PICC line stable position. Prior CABG. Cardiomegaly. Persistent left mid and lower lung field infiltrate. Persistent mild atelectatic changes right upper and right lower lobes. No pneumothorax. IMPRESSION: 1.  Lines and tubes in stable position. 2. Persistent left mid and left lower lung infiltrate, no interim change. Persistent right upper lobe and right lower lobe subsegmental atelectasis. No interim change. 3. Prior CABG.  Stable cardiomegaly. Electronically Signed   By: Marcello Moores  Register   On: 11/05/2015 07:48   Dg Chest Port 1 View  11/04/2015  CLINICAL DATA:  Hypoxia EXAM: PORTABLE CHEST 1 VIEW COMPARISON:  November 04, 2015 FINDINGS: Endotracheal tube tip is 5.3 cm above the carina. Central catheter tip is in the superior vena cava near the  cavoatrial junction. Nasogastric tube tip and side port are below the diaphragm. There also is a feeding tube with the tip below the diaphragm. No pneumothorax. There has been removal of a left chest tube. There is airspace consolidation throughout the left mid and lower lung zones, stable. There is mild atelectasis in the right upper lobe, stable. No new opacity is evident. There is stable  cardiomegaly. The pulmonary vascularity is normal. No adenopathy is evident. IMPRESSION: Tube and catheter positions as described without pneumothorax. Airspace consolidation throughout the left mid and lower lung zones, stable. Atelectasis right upper lobe, stable. No new opacity. Stable cardiomegaly. Electronically Signed   By: Lowella Grip III M.D.   On: 11/04/2015 14:52   Dg Chest Port 1 View  11/04/2015  CLINICAL DATA:  Status post CABG 10/28/2015. EXAM: PORTABLE CHEST 1 VIEW COMPARISON:  Single view of the chest 11/03/2015 and 11/02/2015. FINDINGS: Support tubes and lines including a left chest tube are unchanged since the most recent exam. The chest is better expanded today with decreased atelectasis. Airspace opacity in the left mid and lower lung zones persists. There is likely a left pleural effusion. Marked cardiomegaly is seen with only mild vascular congestion noted. IMPRESSION: Increased pulmonary expansion with decreased scattered atelectasis. Left mid and lower lung zone airspace disease could be due to atelectasis or pneumonia. Small left pleural effusion noted. Support tubes and lines projecting good position.  No pneumothorax. Electronically Signed   By: Inge Rise M.D.   On: 11/04/2015 07:30   Dg Chest Port 1 View  11/03/2015  CLINICAL DATA:  Central line placement. EXAM: PORTABLE CHEST 1 VIEW COMPARISON:  11/03/2015. FINDINGS: Left subclavian central line noted with tip at the cavoatrial junction. Interval removal of right chest tube. Endotracheal tube, NG tube, right PICC line, left IJ  sheath left chest tube in stable position. No pneumothorax. Prior CABG. Stable cardiomegaly. Low lung volumes. A mild component of congestive heart failure cannot be excluded. Similar findings noted on prior exam P IMPRESSION: 1. Interim placement of left subclavian central line, its tip is at the cavoatrial junction. Interim removal right chest tube . No pneumothorax. 2. Remaining lines and tubes including left chest tube in stable position. 3. Prior CABG. Stable cardiomegaly. Low lung volumes. A a mild component congestive heart failure cannot be excluded . Similar findings noted on prior exam. Electronically Signed   By: [place]   On: 11/03/2015 09:58   Dg Chest Port 1 View  11/03/2015  CLINICAL DATA:  Status post CABG on October 27, 2015 persistent left lower lobe atelectasis or pneumonia. No pneumothorax or significant pleural effusion. EXAM: PORTABLE CHEST 1 VIEW COMPARISON:  Portable chest x-ray of November 02, 2015 at 5:48 a.m. FINDINGS: There has been interval placement of an intra aortic balloon pump whose marker lies over the junction of the aortic arch with the proximal descending thoracic aorta. The cardiac silhouette remains enlarged. The pulmonary vascularity remains mildly prominent. The retrocardiac region on the left remains dense. Bilateral chest tubes are in stable position. There is no pneumothorax nor significant pleural effusion. The endotracheal tube tip lies approximately 6.5 cm above the carina. The esophagogastric tube tip projects below the inferior margin of the image. The right-sided PICC line tip projects over the midportion of the SVC. A previously demonstrated pericardial drain is not clearly evident on today's study. IMPRESSION: Mild CHF. Persistent left lower lobe atelectasis or pneumonia. Interval placement of an intra aortic balloon pump which appears to be in appropriate position radiographically. The other support tubes and lines are in reasonable position.  Electronically Signed   By: David  Martinique M.D.   On: 11/03/2015 07:27   Dg Chest Port 1 View  11/02/2015  CLINICAL DATA:  CABG. EXAM: PORTABLE CHEST 1 VIEW COMPARISON:  11/01/2015. FINDINGS: Endotracheal tube, NG tube, right PICC line, bilateral chest tubes in stable position. Prior CABG.  Stable cardiomegaly . Persistent mild interstitial prominence, congestive heart failure cannot be excluded. No interim change. Low lung volumes with basilar atelectasis . IMPRESSION: 1. Endotracheal tube, NG tube, right PICC line, bilateral chest tubes in stable position . No pneumothorax. 2. Prior CABG. Cardiomegaly with pulmonary interstitial prominence and small left pleural effusion consistent with mild congestive heart failure. No interim change from prior exam . Electronically Signed   By: Marcello Moores  Register   On: 11/02/2015 07:20   Dg Chest Port 1 View  11/01/2015  CLINICAL DATA:  Post CABG EXAM: PORTABLE CHEST 1 VIEW COMPARISON:  10/31/2015 FINDINGS: Cardiomediastinal silhouette is stable. Status post CABG. Stable endotracheal and NG tube position. Left chest tube is unchanged in position. Persistent central vascular congestion and mild interstitial prominence bilaterally suspicious for the mild interstitial edema. Left IJ sheath in place. Probable small left pleural effusion left basilar atelectasis or infiltrate. Right arm PICC line is unchanged in position. There is no pneumothorax. IMPRESSION: Status post CABG. Stable support apparatus. Stable left chest tube position. No pneumothorax. Again noted bilateral mild interstitial prominence and perihilar opacities suspicious for mild pulmonary edema. Probable small left pleural effusion left basilar atelectasis or infiltrate. Electronically Signed   By: Lahoma Crocker M.D.   On: 11/01/2015 09:57   Dg Chest Port 1 View  10/31/2015  CLINICAL DATA:  55 year old male with a history of endotracheal tube placement Patient has undergone coronary artery bypass grafting x4  10/27/2015. Placement of intra aortic balloon pump at this date. Ischemic cardiomyopathy. EXAM: PORTABLE CHEST 1 VIEW COMPARISON:  10/30/2015. FINDINGS: Endotracheal tube terminates approximately 3.4 cm above the carina, unchanged. Gastric tube terminates out of the field of view. Defibrillator pads project over the left and right chest. Bilateral thoracostomy tubes. Right upper extremity PICC. The mediastinal drain not visualized. Intra-aortic balloon pump not visualized. Surgical changes of median sternotomy and CABG. Mixed bilateral interstitial and airspace opacities. Low lung volumes. IMPRESSION: Low lung volumes with mixed bilateral interstitial and airspace opacities, similar to the comparison chest x-ray. Retrocardiac opacity may reflect persistent pleural fluid, and/or atelectasis. Surgical support apparatus appear unchanged, as above. Intra aortic balloon pump not visualized. Signed, Dulcy Fanny. Earleen Newport, DO [facility] Electronically Signed   By: Corrie Mckusick D.O.   On: 10/31/2015 07:25   Dg Chest Port 1 View  10/30/2015  CLINICAL DATA:  Post cavity.  Recent CABG. EXAM: PORTABLE CHEST 1 VIEW COMPARISON:  Portable film earlier today. FINDINGS: Overlying support apparatus redemonstrated. Tubes and lines remain stable. Elevated LEFT hemidiaphragm. No definite pneumothorax. Moderate vascular congestion may be increased. Cardiomegaly. IMPRESSION: Slight worsening aeration. Moderate vascular congestion may be increased. Electronically Signed   By: Staci Righter M.D.   On: 10/30/2015 18:39   Dg Chest Port 1 View  10/30/2015  CLINICAL DATA:  Status post coronary bypass grafting EXAM: PORTABLE CHEST 1 VIEW COMPARISON:  10/30/2015 FINDINGS: Cardiac shadow remains enlarged. Bilateral chest tubes are again seen. A right-sided PICC line and endotracheal tube are noted in satisfactory position. Left jugular sheath is noted. Elevation of left hemidiaphragm is  noted with left basilar atelectasis. No pneumothorax is noted. IMPRESSION: Postsurgical change with tubes and lines as described. Mild left basilar atelectasis. Electronically Signed   By: Inez Catalina M.D.   On: 10/30/2015 07:45   Dg Chest Port 1 View  10/30/2015  CLINICAL DATA:  A chest tube in place EXAM: PORTABLE CHEST 1 VIEW COMPARISON:  10/30/2015 FINDINGS: Postoperative changes in the mediastinum.  Endotracheal tube with tip measuring 6.3 cm about the carina. Right PICC catheter with tip over the cavoatrial junction. Left central venous catheter sheath with tip over the left neck consistent location in the internal jugular vein. Bilateral chest tubes. Infiltration or atelectasis in the right lung base. Cardiac enlargement. No significant vascular congestion. No pneumothorax. IMPRESSION: Appliance positioned as described. Cardiac enlargement with infiltration or atelectasis in the right lung base. Bilateral chest tubes are present but no visualized pneumothorax. Electronically Signed   By: Lucienne Capers M.D.   On: 10/30/2015 03:07   Dg Chest Port 1 View  10/30/2015  CLINICAL DATA:  Shortness of breath.  Code blue. EXAM: PORTABLE CHEST 1 VIEW COMPARISON:  10/29/2015 FINDINGS: Postoperative changes in the mediastinum. Bilateral chest tubes are present. Right PICC line with tip over the cavoatrial junction. Left central venous catheter or sheath with tip over the left side of the neck, likely in the left internal jugular vein. Cardiac enlargement without significant vascular congestion. Shallow inspiration with elevation of the left hemidiaphragm. Probable left pleural effusion. No definite pulmonary consolidation. IMPRESSION: Shallow inspiration. Probable left pleural effusion. Cardiac enlargement. Appliances appear in satisfactory position. Electronically Signed   By: Lucienne Capers M.D.   On: 10/30/2015 01:18   Dg Chest Port 1 View  10/29/2015  CLINICAL DATA:  Postop CABG 2 days ago. EXAM:  PORTABLE CHEST 1 VIEW COMPARISON:  Portable chest x-ray of October 28, 2015 FINDINGS: The trachea and esophagus have been extubated. The cardiac silhouette remains enlarged. The pulmonary vascularity is mildly engorged. The bilateral chest tubes and the mediastinal drain are in stable position. There is no pneumothorax or large pleural effusion. The Swan-Ganz catheter tip overlies a proximal right lower lobe pulmonary artery branch. The mediastinum is widened and accentuated by the hypo inflation. IMPRESSION: Bilateral hypoinflation following extubation of the trachea there is crowding of the pulmonary vascularity. Left basilar atelectasis or less likely pneumonia is more prominent today. Probable small left pleural effusion. The remaining support tubes and lines are in stable position. Electronically Signed   By: David  Martinique M.D.   On: 10/29/2015 07:40   Dg Chest Port 1 View  10/28/2015  CLINICAL DATA:  Post CABG, on ventilator, followup portable chest x-ray of 10/28/2015 EXAM: PORTABLE CHEST 1 VIEW COMPARISON:  None. FINDINGS: The tip of the endotracheal tube is approximately 5.2 cm above the carina. Aeration has improved slightly. Atelectasis remains at the lung bases left-greater-than-right. Swan-Ganz catheter tip is in the right lower lobe pulmonary artery and bilateral chest tubes remain. No pneumothorax is seen. Heart size is stable. IMPRESSION: 1. Improved aeration. 2. Bilateral chest tubes.  No pneumothorax. 3. Tip of endotracheal tube approximately 5.2 cm above the carina. Electronically Signed   By: Ivar Drape M.D.   On: 10/28/2015 08:01   Dg Chest Port 1 View  10/27/2015  CLINICAL DATA:  Status post CABG EXAM: PORTABLE CHEST 1 VIEW COMPARISON:  10/27/2015 FINDINGS: Unchanged tracheostomy tube. Swan-Ganz central venous line is now seen with tip advanced into the right perihilar region. Stable right chest tube with and left chest tube. NG tube again crosses the gastroesophageal junction. Stable  mediastinal drain. Enlarged cardiac silhouette. Limited inspiratory effect with bibasilar opacities likely representing atelectasis. IMPRESSION: Anticipated postoperative appearance with bibasilar atelectasis. Electronically Signed   By: Skipper Cliche M.D.   On: 10/27/2015 19:25   Dg Chest Portable 1 View  10/27/2015  CLINICAL DATA:  Status post coronary bypass grafting EXAM: PORTABLE CHEST 1 VIEW COMPARISON:  10/23/2015 FINDINGS: Cardiac shadow is mildly enlarged. A Swan-Ganz catheter is noted in the right pulmonary outflow tract. Bilateral thoracostomy catheters are seen. No pneumothorax is noted. An endotracheal tube is noted in satisfactory position. Intra-aortic balloon pump is noted over the aortic arch. Mild left basilar atelectasis is seen. Mild central vascular congestion is noted. IMPRESSION: Postoperative changes with tubes and lines as described above. Mild vascular congestion and left basilar atelectasis. Electronically Signed   By: Inez Catalina M.D.   On: 10/27/2015 17:23   Portable Chest X-ray 1 View  10/21/2015  CLINICAL DATA:  55 year old male with a history of acute systolic congestive heart failure EXAM: PORTABLE CHEST 1 VIEW COMPARISON:  10/20/2015 FINDINGS: Heart size unchanged, enlarged. Similar appearance of low lung volumes with interstitial opacities, interlobular septal thickening, and developing airspace disease of the bilateral lower lungs. No pneumothorax. Linear opacity overlying the mediastinum and the heart border, of on certain significance. Favored to overlie the patient. IMPRESSION: Evidence of worsening congestive heart failure, with increasing edema. New linear opacity overlies the mediastinum, favored to be overlying the patient, though cannot be localized on this view. Signed, Dulcy Fanny. Earleen Newport, DO [facility] Electronically Signed   By: Corrie Mckusick D.O.   On: 10/21/2015 07:16   Dg Chest Portable 1  View  10/20/2015  CLINICAL DATA:  Shortness of breath. EXAM: PORTABLE CHEST 1 VIEW COMPARISON:  None. FINDINGS: Midline trachea. Cardiomegaly accentuated by AP portable technique. No pleural effusion or pneumothorax. Mildly low lung volumes with bibasilar atelectasis. IMPRESSION: Mild cardiomegaly and low lung volumes.  No acute findings. Electronically Signed   By: Abigail Miyamoto M.D.   On: 10/20/2015 18:09   Dg Abd Portable 1v  11/02/2015  CLINICAL DATA:  Assess feeding tube positioning EXAM: PORTABLE ABDOMEN - 1 VIEW COMPARISON:  None in PACs FINDINGS: The repeated KUB with has less motion artifact. The tip of the feeding tube and nasogastric tube lie in the region of the distal gastric body in the pre-pyloric region. The bowel gas pattern is within the limits of normal. Numerous tubes and lines and electrodes overlie the abdomen. IMPRESSION: The tips of the feeding tube and the esophagogastric tube lie in the region of the distal gastric body in the pre-pyloric region. Electronically Signed   By: David  Martinique M.D.   On: 11/02/2015 12:16   Dg Addison Bailey G Tube Plc W/fl-no Rad  11/03/2015  CLINICAL DATA:  NASO G TUBE PLACEMENT WITH FLUORO Fluoroscopy was utilized by the requesting physician.  No radiographic interpretation.    PHYSICAL EXAM General: Awake on vent Neck: JVP hard to see.  no thyromegaly or thyroid nodule.  Lungs: Decreased breath sounds at bases bilaterally.  CV: Nondisplaced PMI.  Heart regular S1/S2, no S3/S4, no murmur.  2-3+ edema to knees bilaterally. Abdomen: Soft, nontender, no hepatosplenomegaly, no distention.  Neurologic: Alert and follows commands.  Psych: Normal affect. Extremities: No clubbing or cyanosis. Right groin IABP site stable. Dopplerable pedal pulses.   TELEMETRY: Reviewed telemetry, v-pacing.  No VT  ASSESSMENT AND PLAN:  1. CAD: S/p late presentation inferior MI and CABG x 4. Continue statin, ASA. Coronary angiography 3/27 with multiple VT episodes showed  patent LIMA-LAD and SVG-RCA.  Patent SVG-ramus and SVG-D but slow flow in both grafts with diffusely diseased small target vessels.  2. Cardiogenic shock: Acute systolic CHF with RV failure post inferior MI with suspected RV involvement. EF 25-30% on echo (difficult images on 3/24 echo). He has  significant RV failure from suspected RV infarct.  3/27 IABP placed to try to allow decrease in milrinone with frequent VT.  NO stopped this am.  Good co-ox this morning at 71%.  Co-ox ok. CVP up. Still with marked volume overload.  - Increase lasix back to 10 mg/hr for now, - Continue NO for now  and will try to wean to sildenafil. - Continue milrinone at 0.25, and IABP at 1:2 today, plan slow wean.  If co-ox and rhythm remain stable and volume status improving can consider removing tomorrow. 3. Complete heart block:  Underlying CHB still.   Atrial rhythm actually appears to be an atypical atrial flutter.  He is pacing at 90 bpm. Suspect he will need permanent pacing if he recovers, would use BiV device.   4. VF arrest 3/24 early am and again at night 3/24. VT overnight 3/26 and during the day 3/27.  K now better and on lower milrinone dose.  He is off lidocaine and procainamide.  - Continue amiodarone.  - K is better now, watch carefully throughout day and replete as needed.  5. AKI: Creatinine up again slightly today.  Got minimal contrast (30 cc) with cath on 3/27.  Suspect ATN. Baseline creatinine 1.0 6. ID: Possible LLL PNA, elevated PCT.  He is on vanco/Zosyn.    The patient is critically ill with multiple organ systems failure and requires high complexity decision making for assessment and support, frequent evaluation and titration of therapies, application of advanced monitoring technologies and extensive interpretation of multiple databases.   Critical Care Time devoted to patient care services described in this note is 35 Minutes.    Glori Bickers 11/07/2015 8:32 AM

## 2015-11-07 NOTE — Progress Notes (Signed)
Pharmacy Antibiotic Note  Christopher [name] is a 55 y.o. male with pneumonia.  Pharmacy has been consulted for vancomycin dosing - D#4. Also continuing on Zosyn per MD. Afeb, WBC up to 13.5. SCr 1.15 on admit, up to 3.54. CrCl~30, UOP good. To check VT tonight, although will not be a true trough since will have only had 2 doses. Per CCM note, may d/c vanc in next 24-48 hours.  Plan: Vanc 1250mg  IV q48h Monitor clinical progress, c/s, renal function, abx plan/LOT VT tonight at 2130 (not true trough, only 2 doses)  Height: 5\' 10"  (177.8 cm) Weight: 250 lb 3.6 oz (113.5 kg) IBW/kg (Calculated) : 73  Temp (24hrs), Avg:97.6 F (36.4 C), Min:97.3 F (36.3 C), Max:98.2 F (36.8 C)   Recent Labs Lab 11/04/15 0215  11/05/15 0455 11/05/15 1502 11/06/15 0432 11/06/15 1633 11/07/15 0337  WBC 10.5  --  10.3 12.1* 12.4*  --  13.5*  CREATININE 2.37*  < > 2.87* 3.16* 3.47* 3.40* 3.54*  < > = values in this interval not displayed.  Estimated Creatinine Clearance: 30.1 mL/min (by C-G formula based on Cr of 3.54).    Allergies  Allergen Reactions  . Losartan Shortness Of Breath  . Lisinopril Rash    Antimicrobials this admission: 3/21 cefuroxime > 3/23 3/27 Ceftazidime>> 3/29 3/29 Vanc>> 3/29 Zosyn>  Microbiology results: 3/24 urine>> neg 3/27 resp>> normal flora  Babs Bertin, PharmD, [facility] Clinical Pharmacist Pager [phone] 11/07/2015 11:48 AM

## 2015-11-07 NOTE — Plan of Care (Signed)
Problem: Bowel/Gastric: Goal: Gastrointestinal status for postoperative course will improve Outcome: Progressing Still has 400-600/shift bilious drainage from og on LIWS. Had bm 2 days ago and a smear so far today. Passing flatus.  Problem: Cardiac: Goal: Ability to maintain an adequate cardiac output will improve Outcome: Progressing IABP at 1:2, remains on Nitric at 12ppm. Getting sildenafil and milrinone. Coox down to 61.5 today. Continues to gain weight despite being on furosemide drip. Drip was decreased to 5 mg/hr yesterday but was increased to 10 mg/hr this am.  Problem: Nutritional: Goal: Risk for body nutrition deficit will decrease Outcome: Progressing On Vital HP at 64ml/hr and receiving protein supplements via coretrack tube. Tolerated well.  Problem: Tissue Perfusion: Goal: Risk of venous thrombosis will decrease Outcome: Progressing Continues on heparin drip.

## 2015-11-07 NOTE — Plan of Care (Signed)
Problem: Cardiac: Goal: Ability to achieve and maintain adequate cardiopulmonary perfusion will improve Outcome: Progressing Pt without V-Tach x 7 days/ IABP 1:2 all day

## 2015-11-07 NOTE — Progress Notes (Signed)
PULMONARY / CRITICAL CARE MEDICINE   Name: Christopher [name] MRN: [id number] DOB: [birth year]    ADMISSION DATE:  10/20/2015 CONSULTATION DATE:  11/03/2015  REFERRING MD: Dr. Donata Clay / CVTS  CHIEF COMPLAINT:  Acute Hypoxic Respiratory Failure  HISTORY OF PRESENT ILLNESS:   [name] w/ ischemic cardiomyopathy following MI. Patient underwent CABG & CEA. Post-operative course complicated by arrhythmia and biventricular failure. Continues to have IABP in place. Requiring NO inhaled on ventilator. Continuing on Primacor and diuretics. Patient underwent remove of chest tubes today and replacement of IABP.   SUBJECTIVE: Inhaled NO. Lasix drip.  Mental status good.  VITAL SIGNS: BP 104/54 mmHg  Pulse 92  Temp(Src) 97.3 F (36.3 C) (Axillary)  Resp 15  Ht  (1.778 m)  Wt 113.5 kg (250 lb 3.6 oz)  BMI 35.90 kg/m2  SpO2 98%  HEMODYNAMICS: CVP:  [11 mmHg-20 mmHg] 16 mmHg  VENTILATOR SETTINGS: Vent Mode:  [-] PRVC FiO2 (%):  [40 %-50 %] 50 % Set Rate:  [14 bmp] 14 bmp Vt Set:  [650 mL] 650 mL PEEP:  [10 cmH20] 10 cmH20 Plateau Pressure:  [23 cmH20-28 cmH20] 25 cmH20  INTAKE / OUTPUT: I/O last 3 completed shifts: In: 6037.9 [I.V.:2975.4; NG/GT:2550; IV Piggyback:512.5] Out: 4890 [Urine:3990; Emesis/NG output:900]  PHYSICAL EXAMINATION: General:  Comfortable. Sedated. Family at bedside. Neuro:  Sedated.  Pupils equal. HEENT:  ETT in place.  No scleral icterus or injection. Cardiovascular:  Regular rate & rhythm. No appreciable JVD. Lower extremity edema persists & unchanged. Lungs:  Coarse breath sounds bilaterally. Symmetric chest rise on ventilator. Abdomen:  Soft. Protuberant. Normal BS. Integument:  Warm & dry. No rash on exposed skin.  LABS:  BMET  Recent Labs Lab 11/05/15 1502 11/06/15 0432 11/06/15 1633 11/07/15 0337  NA 137 138 136 138  K 4.7 4.0 4.0 3.9  CL 93* 93* 90* 93*  CO2 32 32  --  31  BUN 78* 91* 94* 106*  CREATININE 3.16* 3.47* 3.40*  3.54*  GLUCOSE 126* 135* 136* 131*   Electrolytes  Recent Labs Lab 11/05/15 0455 11/05/15 1502 11/06/15 0432 11/07/15 0337  CALCIUM 8.1* 8.2* 8.4* 8.6*  MG 2.6*  --  2.6* 2.7*  PHOS 5.2*  --  6.1* 6.0*   CBC  Recent Labs Lab 11/05/15 1502 11/06/15 0432 11/06/15 1633 11/07/15 0337  WBC 12.1* 12.4*  --  13.5*  HGB 9.1* 8.5* 9.5* 8.6*  HCT 28.6* 27.6* 28.0* 28.1*  PLT 229 222  --  234   Coag's No results for input(s): APTT, INR in the last 168 hours.  Sepsis Markers  Recent Labs Lab 11/03/15 1154 11/04/15 0215 11/05/15 0455  PROCALCITON 1.75 1.50 1.17   ABG  Recent Labs Lab 11/05/15 0404 11/06/15 0316 11/07/15 0324  PHART 7.441 7.410 7.434  PCO2ART 57.1* 54.1* 50.6*  PO2ART 294.0* 88.9 59.7*    Liver Enzymes  Recent Labs Lab 11/03/15 0345 11/04/15 0215 11/07/15 0337  AST 101* 85* 45*  ALT 139* 117* 57  ALKPHOS 73 74 86  BILITOT 1.3* 1.1 1.4*  ALBUMIN 2.4* 2.4* 2.7*    Cardiac Enzymes  Recent Labs Lab 11/02/15 0748 11/02/15 1535 11/02/15 2000  TROPONINI 1.12* 1.15* 1.12*    Glucose  Recent Labs Lab 11/06/15 1234 11/06/15 1543 11/06/15 2040 11/07/15 0006 11/07/15 0846 11/07/15 1144  GLUCAP 115* 120* 124* 103* 125* 126*   Imaging No results found.  STUDIES:  Port CXR 3/28:  Previously reviewed by me. R PICC in good  psition. ETT >5cm above carina. New L [place] CVL. L lung opacity unchanged. Low lung volumes. L chest tube in place. Port CXR 3/30:  Personally reviewed by me.  MICROBIOLOGY: Tracheal Asp Ctx 3/27:  Oral Flora Urine Ctx 3/24:  Negative MRSA PCR 3/20:  Negative MRSA PCR 3/14:  Negative  ANTIBIOTICS: Zosyn 3/29>>> Vancomcyin 3/21 - 3/22; 3/29>>> Cefuroxime 3/21 - 3/23 (periop prophylaxis) Elita Quick 3/27 - 3/29  SIGNIFICANT EVENTS: 3/14 - Admit 3/14 - LHC 3/21 - CABG 3/27 - IABP replaced 3/28 - Chest Tubes removed. RHC & LHC.  LINES/TUBES: OETT 7.5 Extubated 3/22; 3.24>>> L Chest Tube>>> L [place] CVL  3/28>>> L IJ CVL 3/21>>> R PICC 3/23>>> IABP 3/27>>> R Femoral Art Sheath 3/27>>> L Radial Art Line 3/21>>> NGT R Nare 3/27>>> OGT 3/24>>> Foley 3/22>>>  ASSESSMENT / PLAN:  PULMONARY A: Acute Hypoxic Respiratory Failure Chronic Hypercarbic Respiratory Failure - OSA/OHS vs COPD. L Chest Tube  P:   Full Vent Support, no weaning, PEEP 10 and FiO2 50%. Continue Xopenex q6hr Holding SBT Inh NO 12ppm  CARDIOVASCULAR A:  S/P CABG & CEA Left Ventricular Heart Failure post MI Cardiogenic Shock Periop Complete Heart Block & Post-op V Tach H/O HTN  P:  CVTS & Cardiology Managing ASA VT Amiodarone gtt Primacor gtt Lasix gtt Heparin gtt Levophed gtt to maintain MAP IABP Transitioning from Inh NO to Revatio per notes  RENAL A:   Acute Renal Failure  P:   Trending UOP with Foley Electrolytes & Renal Function daily Replacing electrolytes as indicated  GASTROINTESTINAL A:   Transaminitis - Likely shock liver/hepatic congestion. Constipation  P:   Trending LFTs daily Continuing TFs Protonix IV daily Dulcolax PR prn Senna bid  HEMATOLOGIC A:   Anemia - Hgb worsening. No signs of active bleeding.  P:  Transfusion PRBC ordered Heparin gtt Trending cell counts daily w/ CBC  INFECTIOUS A:   Possible HCAP - Ctx w/ Oral Flora.  P:   Switched from [place] on 3/29 w/ acute decompensation Zosyn /Vancomycin Day #2 Plan to re-culture for fever  ENDOCRINE A:   Hyperglycemia - BG controlled.  P:   Continue Levemir bid Accu-checks q4hr SSI per algorithm  NEUROLOGIC A:   Sedation on Ventilator Post-op Pain Control   P:   RASS goal: -1 to -2 Versed gtt & IV prn Fentanyl gtt & IV prn  FAMILY UPDATES: Significant other updated at bedside 4/01.  The patient is critically ill with multiple organ systems failure and requires high complexity decision making for assessment and support, frequent evaluation and titration of therapies, application of advanced  monitoring technologies and extensive interpretation of multiple databases.   Critical Care Time devoted to patient care services described in this note is  35  Minutes. This time reflects time of care of this signee Dr Koren Bound. This critical care time does not reflect procedure time, or teaching time or supervisory time of PA/NP/Med student/Med Resident etc but could involve care discussion time.  Alyson Reedy, M.D. [facility] Pulmonary/Critical Care Medicine. Pager: [phone]. After hours pager: [phone].  11/07/2015, 2:25 PM

## 2015-11-07 NOTE — Progress Notes (Signed)
5 Days Post-Op Procedure(s) (LRB): Right/Left Heart Cath and Coronary/Graft Angiography (N/A) Intra-Aortic Balloon Pump Insertion Subjective: Intubated, sedated  Objective: Vital signs in last 24 hours: Temp:  [97 F (36.1 C)-98.2 F (36.8 C)] 98.2 F (36.8 C) (04/01 0410) Pulse Rate:  [44-93] 92 (04/01 0600) Cardiac Rhythm:  [-] A-V Sequential paced (03/31 2000) Resp:  [12-20] 16 (04/01 0600) SpO2:  [92 %-100 %] 98 % (04/01 0600) Arterial Line BP: (109-143)/(52-71) 116/62 mmHg (04/01 0600) FiO2 (%):  [40 %] 40 % (04/01 0320) Weight:  [250 lb 3.6 oz (113.5 kg)] 250 lb 3.6 oz (113.5 kg) (04/01 0500)  Hemodynamic parameters for last 24 hours: CVP:  [12 mmHg-20 mmHg] 13 mmHg  Intake/Output from previous day: 03/31 0701 - 04/01 0700 In: 3821.4 [I.V.:1938.9; NG/GT:1720; IV Piggyback:162.5] Out: 2915 [Urine:2415; Emesis/NG output:500] Intake/Output this shift:    General appearance: no distress Neurologic: nonfocal Heart: regular rate and rhythm Lungs: rhonchi bilaterally Abdomen: normal findings: soft, non-tender Extremities: edema 4+ rash on flanks  Lab Results:  Recent Labs  11/06/15 0432 11/06/15 1633 11/07/15 0337  WBC 12.4*  --  13.5*  HGB 8.5* 9.5* 8.6*  HCT 27.6* 28.0* 28.1*  PLT 222  --  234   BMET:  Recent Labs  11/06/15 0432 11/06/15 1633 11/07/15 0337  NA 138 136 138  K 4.0 4.0 3.9  CL 93* 90* 93*  CO2 32  --  31  GLUCOSE 135* 136* 131*  BUN 91* 94* 106*  CREATININE 3.47* 3.40* 3.54*  CALCIUM 8.4*  --  8.6*    PT/INR: No results for input(s): LABPROT, INR in the last 72 hours. ABG    Component Value Date/Time   PHART 7.434 11/07/2015 0324   HCO3 33.3* 11/07/2015 0324   TCO2 34.9 11/07/2015 0324   ACIDBASEDEF TEST WILL BE CREDITED 11/01/2015 0446   O2SAT 61.5 11/07/2015 0328   CBG (last 3)   Recent Labs  11/06/15 1543 11/06/15 2040 11/07/15 0006  GLUCAP 120* 124* 103*    Assessment/Plan: S/P Procedure(s) (LRB): Right/Left  Heart Cath and Coronary/Graft Angiography (N/A) Intra-Aortic Balloon Pump Insertion -  Remains critically ill but hemodynamically stable CV- continue IABP, inotropes.   Remains pacer dependent  RESP- VDRF- continue vent support, planr per CCM   on vanco and zosyn  RENAL- Acute renal failure- creatinine plateaued ~ 3.5, still has UOP  Volume overloaded- Dr. Gala Romney increased lasix to 10  Nutrition- on TF   LOS: 18 days    Loreli Slot 11/07/2015

## 2015-11-07 NOTE — Progress Notes (Signed)
Pharmacy Antibiotic Note  Christopher [name] is a 55 y.o. male with pneumonia.  Pharmacy has been consulted for vancomycin dosing.  Trough tonight within goal range Plan: Continue Vanc 1250mg  IV q48h   Height: 5\' 10"  (177.8 cm) Weight: 250 lb 3.6 oz (113.5 kg) IBW/kg (Calculated) : 73  Temp (24hrs), Avg:97.7 F (36.5 C), Min:97.3 F (36.3 C), Max:98.2 F (36.8 C)   Recent Labs Lab 11/04/15 0215  11/05/15 0455 11/05/15 1502 11/06/15 0432 11/06/15 1633 11/07/15 0337 11/07/15 2130  WBC 10.5  --  10.3 12.1* 12.4*  --  13.5*  --   CREATININE 2.37*  < > 2.87* 3.16* 3.47* 3.40* 3.54*  --   VANCOTROUGH  --   --   --   --   --   --   --  20  < > = values in this interval not displayed.  Estimated Creatinine Clearance: 30.1 mL/min (by C-G formula based on Cr of 3.54).    Allergies  Allergen Reactions  . Losartan Shortness Of Breath  . Lisinopril Rash    Antimicrobials this admission: 3/21 Cefuroxime  >> 3/23 3/27 Ceftazidime >> 3/29 3/29 Vancomycin >> 3/29 Zosyn  >> 4/1  4/1 Levaquin  >> Microbiology results: 3/24 urine>> neg 3/27 resp>> normal flora  Geannie Risen, PharmD, BCPS   11/07/2015 11:04 PM

## 2015-11-07 NOTE — Progress Notes (Signed)
      [address]       [place] [address]             [phone]      PM Rounds  Stable day for the most part Rash has worsened significantly- concerned it may be due to zosyn- will dc and replace with levaquin Renal function stable still with modest response to lasix drip  Viviann Spare C. Dorris Fetch, MD [facility] [phone]

## 2015-11-08 ENCOUNTER — Inpatient Hospital Stay (HOSPITAL_COMMUNITY): Payer: BLUE CROSS/BLUE SHIELD

## 2015-11-08 DIAGNOSIS — R579 Shock, unspecified: Secondary | ICD-10-CM

## 2015-11-08 LAB — GLUCOSE, CAPILLARY
GLUCOSE-CAPILLARY: 118 mg/dL — AB (ref 65–99)
GLUCOSE-CAPILLARY: 154 mg/dL — AB (ref 65–99)
GLUCOSE-CAPILLARY: 142 mg/dL — AB (ref 65–99)
Glucose-Capillary: 107 mg/dL — ABNORMAL HIGH (ref 65–99)
Glucose-Capillary: 139 mg/dL — ABNORMAL HIGH (ref 65–99)
Glucose-Capillary: 143 mg/dL — ABNORMAL HIGH (ref 65–99)

## 2015-11-08 LAB — COMPREHENSIVE METABOLIC PANEL
ALT: 49 U/L (ref 17–63)
AST: 44 U/L — ABNORMAL HIGH (ref 15–41)
Albumin: 2.5 g/dL — ABNORMAL LOW (ref 3.5–5.0)
Alkaline Phosphatase: 86 U/L (ref 38–126)
Anion gap: 14 (ref 5–15)
BUN: 118 mg/dL — ABNORMAL HIGH (ref 6–20)
CO2: 31 mmol/L (ref 22–32)
Calcium: 8.6 mg/dL — ABNORMAL LOW (ref 8.9–10.3)
Chloride: 96 mmol/L — ABNORMAL LOW (ref 101–111)
Creatinine, Ser: 3.71 mg/dL — ABNORMAL HIGH (ref 0.61–1.24)
GFR calc Af Amer: 20 mL/min — ABNORMAL LOW (ref >60)
GFR calc non Af Amer: 17 mL/min — ABNORMAL LOW (ref >60)
Glucose, Bld: 119 mg/dL — ABNORMAL HIGH (ref 65–99)
Potassium: 3.6 mmol/L (ref 3.5–5.1)
Sodium: 141 mmol/L (ref 135–145)
Total Bilirubin: 1.1 mg/dL (ref 0.3–1.2)
Total Protein: 6.3 g/dL — ABNORMAL LOW (ref 6.5–8.1)

## 2015-11-08 LAB — CARBOXYHEMOGLOBIN
CARBOXYHEMOGLOBIN: 1.4 % (ref 0.5–1.5)
Carboxyhemoglobin: 1.3 % (ref 0.5–1.5)
Carboxyhemoglobin: 1.4 % (ref 0.5–1.5)
METHEMOGLOBIN: 0.7 % (ref 0.0–1.5)
Methemoglobin: 0.9 % (ref 0.0–1.5)
Methemoglobin: 0.8 % (ref 0.0–1.5)
O2 Saturation: 66.5 %
O2 Saturation: 93.5 %
O2 Saturation: 64 %
TOTAL HEMOGLOBIN: 10.6 g/dL — AB (ref 13.5–18.0)
Total hemoglobin: 8.7 g/dL — ABNORMAL LOW (ref 13.5–18.0)
Total hemoglobin: 14.8 g/dL (ref 13.5–18.0)

## 2015-11-08 LAB — CBC
HCT: 27.6 % — ABNORMAL LOW (ref 39.0–52.0)
Hemoglobin: 8.5 g/dL — ABNORMAL LOW (ref 13.0–17.0)
MCH: 28.1 pg (ref 26.0–34.0)
MCHC: 30.8 g/dL (ref 30.0–36.0)
MCV: 91.4 fL (ref 78.0–100.0)
Platelets: 275 10*3/uL (ref 150–400)
RBC: 3.02 MIL/uL — ABNORMAL LOW (ref 4.22–5.81)
RDW: 17.1 % — ABNORMAL HIGH (ref 11.5–15.5)
WBC: 12.7 10*3/uL — ABNORMAL HIGH (ref 4.0–10.5)

## 2015-11-08 LAB — BLOOD GAS, ARTERIAL
Acid-Base Excess: 7.3 mmol/L — ABNORMAL HIGH (ref 0.0–2.0)
Bicarbonate: 31.4 mEq/L — ABNORMAL HIGH (ref 20.0–24.0)
FIO2: 50
MECHVT: 650 mL
O2 Saturation: 97 %
PEEP: 10 cmH2O
Patient temperature: 98.6
RATE: 14 resp/min
TCO2: 32.8 mmol/L (ref 0–100)
pCO2 arterial: 45.4 mmHg — ABNORMAL HIGH (ref 35.0–45.0)
pH, Arterial: 7.454 — ABNORMAL HIGH (ref 7.350–7.450)
pO2, Arterial: 93.2 mmHg (ref 80.0–100.0)

## 2015-11-08 LAB — POCT I-STAT, CHEM 8
BUN: 121 mg/dL — AB (ref 6–20)
CHLORIDE: 94 mmol/L — AB (ref 101–111)
CREATININE: 3.8 mg/dL — AB (ref 0.61–1.24)
Calcium, Ion: 1.1 mmol/L — ABNORMAL LOW (ref 1.12–1.23)
GLUCOSE: 167 mg/dL — AB (ref 65–99)
HCT: 28 % — ABNORMAL LOW (ref 39.0–52.0)
Hemoglobin: 9.5 g/dL — ABNORMAL LOW (ref 13.0–17.0)
POTASSIUM: 4.6 mmol/L (ref 3.5–5.1)
Sodium: 140 mmol/L (ref 135–145)
TCO2: 30 mmol/L (ref 0–100)

## 2015-11-08 LAB — MAGNESIUM: Magnesium: 2.7 mg/dL — ABNORMAL HIGH (ref 1.7–2.4)

## 2015-11-08 LAB — POCT ACTIVATED CLOTTING TIME
ACTIVATED CLOTTING TIME: 162 s
ACTIVATED CLOTTING TIME: 162 s
Activated Clotting Time: 173 seconds
Activated Clotting Time: 173 seconds
Activated Clotting Time: 162 seconds

## 2015-11-08 LAB — HEPARIN LEVEL (UNFRACTIONATED): Heparin Unfractionated: 0.21 IU/mL — ABNORMAL LOW (ref 0.30–0.70)

## 2015-11-08 LAB — PHOSPHORUS: PHOSPHORUS: 6.2 mg/dL — AB (ref 2.5–4.6)

## 2015-11-08 MED ORDER — POTASSIUM CHLORIDE 20 MEQ/15ML (10%) PO SOLN
40.0000 meq | Freq: Once | ORAL | Status: AC
  Administered 2015-11-08: 40 meq via ORAL
  Filled 2015-11-08: qty 30

## 2015-11-08 MED ORDER — DIPHENHYDRAMINE HCL 50 MG/ML IJ SOLN
25.0000 mg | Freq: Three times a day (TID) | INTRAMUSCULAR | Status: DC | PRN
  Administered 2015-11-08 – 2015-11-22 (×9): 25 mg via INTRAVENOUS
  Filled 2015-11-08 (×9): qty 1

## 2015-11-08 MED ORDER — SILDENAFIL CITRATE 20 MG PO TABS
40.0000 mg | ORAL_TABLET | Freq: Three times a day (TID) | ORAL | Status: DC
  Administered 2015-11-08 – 2015-11-24 (×49): 40 mg via ORAL
  Filled 2015-11-08 (×53): qty 2

## 2015-11-08 MED ORDER — FENTANYL CITRATE (PF) 2500 MCG/50ML IJ SOLN
25.0000 ug/h | Status: DC
  Administered 2015-11-08 – 2015-11-11 (×6): 300 ug/h via INTRAVENOUS
  Administered 2015-11-12: 100 ug/h via INTRAVENOUS
  Filled 2015-11-08 (×9): qty 100

## 2015-11-08 MED ORDER — METHYLPREDNISOLONE SODIUM SUCC 125 MG IJ SOLR
40.0000 mg | Freq: Two times a day (BID) | INTRAMUSCULAR | Status: AC
  Administered 2015-11-08 – 2015-11-09 (×4): 40 mg via INTRAVENOUS
  Filled 2015-11-08 (×4): qty 2

## 2015-11-08 MED ORDER — METOLAZONE 5 MG PO TABS
10.0000 mg | ORAL_TABLET | Freq: Every day | ORAL | Status: DC
  Administered 2015-11-08: 10 mg via ORAL
  Filled 2015-11-08: qty 2

## 2015-11-08 MED ORDER — METOPROLOL TARTRATE 1 MG/ML IV SOLN
5.0000 mg | Freq: Two times a day (BID) | INTRAVENOUS | Status: DC
  Administered 2015-11-08 (×2): 5 mg via INTRAVENOUS
  Filled 2015-11-08 (×2): qty 5

## 2015-11-08 NOTE — Progress Notes (Signed)
   11/08/15 1100  Airway Suctioning/Secretions  Suction Type ETT  Suction Device  Catheter  Secretion Amount Small  Secretion Color Tan  Secretion Consistency Thick  Suction Tolerance Tolerated fairly well  Suctioning Adverse Effects None  Oral Suctioning/Secretions  Suction Type Oral  Suction Device [place]  Secretion Amount Moderate  Secretion Color White  Secretion Consistency Thick  Suction Tolerance Tolerated fairly well  Suctioning Adverse Effects None  O2 sat decreased to 91 after turning to clean up stool. Also had been rotated to right side after clean up. Rhonchi throughout. Preoxygenated prior to suctioning.  Sats up to 95 after suctioning and preoxygenation breaths.

## 2015-11-08 NOTE — Progress Notes (Signed)
PULMONARY / CRITICAL CARE MEDICINE   Name: Christopher [name] MRN: [id number] DOB: 1961-01-15    ADMISSION DATE:  10/20/2015 CONSULTATION DATE:  11/03/2015  REFERRING MD: Dr. Donata Clay / CVTS  CHIEF COMPLAINT:  Acute Hypoxic Respiratory Failure  HISTORY OF PRESENT ILLNESS:   [name] w/ ischemic cardiomyopathy following MI. Patient underwent CABG & CEA. Post-operative course complicated by arrhythmia and biventricular failure. Continues to have IABP in place. Requiring NO inhaled on ventilator. Continuing on Primacor and diuretics. Patient underwent remove of chest tubes today and replacement of IABP.   SUBJECTIVE: Inhaled NO. Lasix drip.  Mental status good.  Difficulty with oxygenation this AM.  VITAL SIGNS: BP 109/66 mmHg  Pulse 91  Temp(Src) 97.5 F (36.4 C) (Axillary)  Resp 15  Ht 5\' 10"  (1.778 m)  Wt 115.1 kg (253 lb 12 oz)  BMI 36.41 kg/m2  SpO2 96%  HEMODYNAMICS: CVP:  [16 mmHg-22 mmHg] 21 mmHg  VENTILATOR SETTINGS: Vent Mode:  [-] PRVC FiO2 (%):  [50 %] 50 % Set Rate:  [14 bmp] 14 bmp Vt Set:  [650 mL] 650 mL PEEP:  [10 cmH20] 10 cmH20 Plateau Pressure:  [20 cmH20-26 cmH20] 25 cmH20  INTAKE / OUTPUT: I/O last 3 completed shifts: In: 6471.1 [P.O.:10; I.V.:3161.1; NG/GT:2750; IV Piggyback:550] Out: 4905 [Urine:3755; Emesis/NG output:1150]  PHYSICAL EXAMINATION: General:  Comfortable. Sedated. Wife at bedside. Neuro:  Sedated.  Pupils equal.  Easily arousable and interactive. HEENT:  ETT in place.  No scleral icterus or injection. Cardiovascular:  Regular rate & rhythm. No appreciable JVD. Lower extremity edema persists & unchanged. Lungs:  Coarse breath sounds bilaterally. Symmetric chest rise on ventilator. Abdomen:  Soft. Protuberant. Normal BS. Integument:  Warm & dry. No rash on exposed skin.  LABS:  BMET  Recent Labs Lab 11/06/15 0432 11/06/15 1633 11/07/15 0337 11/08/15 0400  NA 138 136 138 141  K 4.0 4.0 3.9 3.6  CL 93* 90* 93* 96*   CO2 32  --  31 31  BUN 91* 94* 106* 118*  CREATININE 3.47* 3.40* 3.54* 3.71*  GLUCOSE 135* 136* 131* 119*   Electrolytes  Recent Labs Lab 11/06/15 0432 11/07/15 0337 11/08/15 0400  CALCIUM 8.4* 8.6* 8.6*  MG 2.6* 2.7* 2.7*  PHOS 6.1* 6.0* 6.2*   CBC  Recent Labs Lab 11/06/15 0432 11/06/15 1633 11/07/15 0337 11/08/15 0400  WBC 12.4*  --  13.5* 12.7*  HGB 8.5* 9.5* 8.6* 8.5*  HCT 27.6* 28.0* 28.1* 27.6*  PLT 222  --  234 275   Coag's No results for input(s): APTT, INR in the last 168 hours.  Sepsis Markers  Recent Labs Lab 11/03/15 1154 11/04/15 0215 11/05/15 0455  PROCALCITON 1.75 1.50 1.17   ABG  Recent Labs Lab 11/06/15 0316 11/07/15 0324 11/08/15 0401  PHART 7.410 7.434 7.454*  PCO2ART 54.1* 50.6* 45.4*  PO2ART 88.9 59.7* 93.2    Liver Enzymes  Recent Labs Lab 11/04/15 0215 11/07/15 0337 11/08/15 0400  AST 85* 45* 44*  ALT 117* 57 49  ALKPHOS 74 86 86  BILITOT 1.1 1.4* 1.1  ALBUMIN 2.4* 2.7* 2.5*    Cardiac Enzymes  Recent Labs Lab 11/02/15 0748 11/02/15 1535 11/02/15 2000  TROPONINI 1.12* 1.15* 1.12*    Glucose  Recent Labs Lab 11/07/15 1601 11/07/15 2025 11/08/15 0019 11/08/15 0416 11/08/15 0704 11/08/15 1106  GLUCAP 127* 111* 139* 107* 118* 143*   Imaging No results found.  STUDIES:  Port CXR 3/28:  Previously reviewed by me. R PICC  in good psition. ETT >5cm above carina. New L [place] CVL. L lung opacity unchanged. Low lung volumes. L chest tube in place. Port CXR 3/30:  Personally reviewed by me.  MICROBIOLOGY: Tracheal Asp Ctx 3/27:  Oral Flora Urine Ctx 3/24:  Negative MRSA PCR 3/20:  Negative MRSA PCR 3/14:  Negative  ANTIBIOTICS: Zosyn 3/29>>> Vancomcyin 3/21 - 3/22; 3/29>>> Cefuroxime 3/21 - 3/23 (periop prophylaxis) Elita Quick 3/27 - 3/29  SIGNIFICANT EVENTS: 3/14 - Admit 3/14 - LHC 3/21 - CABG 3/27 - IABP replaced 3/28 - Chest Tubes removed. RHC & LHC.  LINES/TUBES: OETT 7.5 Extubated 3/22;  3.24>>> L Chest Tube>>> L [place] CVL 3/28>>> L IJ CVL 3/21>>> R PICC 3/23>>> IABP 3/27>>> R Femoral Art Sheath 3/27>>> L Radial Art Line 3/21>>> NGT R Nare 3/27>>> OGT 3/24>>> Foley 3/22>>>  ASSESSMENT / PLAN:  PULMONARY A: Acute Hypoxic Respiratory Failure Chronic Hypercarbic Respiratory Failure - OSA/OHS vs COPD. L Chest Tube  P:   Full Vent Support, no weaning, PEEP 10 and FiO2 50%. Continue Xopenex q6hr Holding SBT Inh NO 12ppm Needs volume negative prior to any serious consideration of extubation.  CARDIOVASCULAR A:  S/P CABG & CEA Left Ventricular Heart Failure post MI Cardiogenic Shock Periop Complete Heart Block & Post-op V Tach H/O HTN  P:  CVTS & Cardiology Managing ASA VT Amiodarone gtt Primacor gtt. Lasix gtt to off until able to start CRRT. Heparin gtt Levophed gtt to maintain MAP IABP 1:3 Transitioning from Inh NO to Revatio per notes  RENAL A:   Acute Renal Failure  P:   Trending UOP with Foley Electrolytes & Renal Function daily Replacing electrolytes as indicated  GASTROINTESTINAL A:   Transaminitis - Likely shock liver/hepatic congestion. Constipation  P:   Trending LFTs daily Continuing TFs Protonix IV daily Dulcolax PR prn Senna bid  HEMATOLOGIC A:   Anemia - Hgb worsening. No signs of active bleeding.  P:  Transfusion PRBC ordered Heparin gtt Trending cell counts daily w/ CBC  INFECTIOUS A:   Possible HCAP - Ctx w/ Oral Flora.  P:   Switched from [place] on 3/29 w/ acute decompensation Zosyn /Vancomycin Day #3 Plan to re-culture for fever  ENDOCRINE A:   Hyperglycemia - BG controlled.  P:   Continue Levemir bid Accu-checks q4hr SSI per algorithm  NEUROLOGIC A:   Sedation on Ventilator Post-op Pain Control   P:   RASS goal: -1 to -2 Versed gtt & IV prn Fentanyl gtt & IV prn  FAMILY UPDATES: Significant other updated at bedside 4/02.  The patient is critically ill with multiple organ systems  failure and requires high complexity decision making for assessment and support, frequent evaluation and titration of therapies, application of advanced monitoring technologies and extensive interpretation of multiple databases.   Critical Care Time devoted to patient care services described in this note is  35  Minutes. This time reflects time of care of this signee Dr Koren Bound. This critical care time does not reflect procedure time, or teaching time or supervisory time of PA/NP/Med student/Med Resident etc but could involve care discussion time.  Alyson Reedy, M.D. [facility] Pulmonary/Critical Care Medicine. Pager: [phone]. After hours pager: [phone].  11/08/2015, 12:37 PM

## 2015-11-08 NOTE — Progress Notes (Signed)
ANTICOAGULATION CONSULT NOTE - Follow Up Consult  Pharmacy Consult for Heparin Indication: IABP  Allergies  Allergen Reactions  . Losartan Shortness Of Breath  . Lisinopril Rash    Patient Measurements: Height: 5\' 10"  (177.8 cm) Weight: 253 lb 12 oz (115.1 kg) IBW/kg (Calculated) : 73 Heparin Dosing Weight: 99kg  Vital Signs: Temp: 97.5 F (36.4 C) (04/02 1107) Temp Source: Axillary (04/02 1107) BP: 109/66 mmHg (04/02 1126) Pulse Rate: 91 (04/02 1126)  Labs:  Recent Labs  11/06/15 0432 11/06/15 1633 11/07/15 0337 11/08/15 0400  HGB 8.5* 9.5* 8.6* 8.5*  HCT 27.6* 28.0* 28.1* 27.6*  PLT 222  --  234 275  HEPARINUNFRC 0.26*  --  0.23* 0.21*  CREATININE 3.47* 3.40* 3.54* 3.71*    Estimated Creatinine Clearance: 28.9 mL/min (by C-G formula based on Cr of 3.71).   Medications:  Heparin @ 1200 units/h (max rate per MD)  Assessment: 55yom continues on heparin for IABP. Heparin level is therapeutic at 0.21. Received blood 3/31- Hgb 8.5 stable today, plt wnl. No signs of active bleeding.  Goal of Therapy:  Heparin level 0.2-0.5 units/ml Monitor platelets by anticoagulation protocol: Yes   Plan:  Continue heparin at 1200 units/hr Daily HL/CBC Monitor s/sx bleeding   Babs Bertin, PharmD, [facility] Clinical Pharmacist Pager [phone] 11/08/2015 11:31 AM

## 2015-11-08 NOTE — Progress Notes (Signed)
Called to 2S15 for IABP removal. ACT within acceptable range for removal. Pt. Intubated and somewhat sedated. R DP + via doppler. R FA site noted moderate amount of resolving ecchymosis. Site soft to palpation prior to IABP removal. 7.5 FR sheath and IABP removed simultaneously after IABP discontinued. IABP and sheath appear to be intact upon removal. Small amount of bleed back for removal and manual pressure held starting at 1805 hrs. Pt . Tolerated removal well. Hemostasis was easily achieved . Of note was resolving ecchymosis to medial side of groin- site is without hematoma. Arteriotomy noted slightly red. RN Made aware. Manual pressure held for 35 min with excellent hemostasis achieved. R DP+ via doppler. A dry sterile dressing was applied to RFA site. Pt. Tolerated the removal and hold well. SR up X4, bed in lowest position. RN caring for patient was able to witness site and had no further request. RFA site was left in a level 0 condition.

## 2015-11-08 NOTE — Progress Notes (Signed)
Changing concentration on fentanyl drips. Wasted 100 mcg fentanyl ( ) down drain. Michail Jewels RN witnessed. l

## 2015-11-08 NOTE — Progress Notes (Signed)
      [address]       [place] [address]             [phone]      PM rounds  IABP being removed at present  Co-ox= 64  BP 109/66 mmHg  Pulse 91  Temp(Src) 97 F (36.1 C) (Axillary)  Resp 15  Ht 5\' 10"  (1.778 m)  Wt 253 lb 12 oz (115.1 kg)  BMI 36.41 kg/m2  SpO2 99%   Intake/Output Summary (Last 24 hours) at 11/08/15 1748 Last data filed at 11/08/15 1600  Gross per 24 hour  Intake 4660.89 ml  Output   4198 ml  Net 462.89 ml   Rash unchanged  Continue present care  Viviann Spare C. Dorris Fetch, MD [facility] [phone]

## 2015-11-08 NOTE — Progress Notes (Signed)
Patient ID: Christopher [name], male   DOB: 1961-07-23, 55 y.o.   MRN: [id number]   [name] is a 55 year old with a history of HTN, TIA, CHB, DMII, and marijuana use. On March 14th he presented to [facility] with increased dyspnea. On arrival he had inferior MI and CHB. Urgently transferred to [facility] for cath. LHC Ramus lesion, 70% stenosed, Prox LAD lesion, 95% stenosed, and prox RCA lesion, 100% stenosed. Developed pulmonary edema post cath and was diuresed IV lasix.   Cardiac Surgery consulted---10/27/15 S/P CABG x4 LIMA to LAD, SVG to DIAGONAL, SVG to RAMUS, INTERMEDIATE, SVG to RDA 3 and R CEA. Extubated 3/22. IABP removed 3/23 and remained on dopamine, milrinone, and epi. 3/23 had VF arrest requiring CPR shock x4. Re-intubated. CXR-infiltrate R lung base. Started on milrinone 0.2 mcg, amio drip, norepinephrine. Nitric oxide restarted at 10 ppm.  He had vfib arrest again 3/24 pm and lidocaine added.  VT 3/26-3/27 overnight again, 8 shocks, procainamide added and no further VT.  Stopped norepinephrine but milrinone up to 0.5 still with marginal co-ox.  In effort to decrease milrinone and decrease drive towards VT, he was taken back to cath lab and IABP placed again.  Milrinone decreased to 0.375 then 0.25.    No further VT.  He is now off lidocaine and procainamide.  Remains intubated but is awake. On IABP. Milrinone 0.25. Lasix increased yesterday to 10/hr. Decent urine output but weight up again. Creatinine 3.4-> 3.5-> 3.7   Diffuse rash. Abx changed. Co-ox 67% on IABP 1:2. CVP 18   ECHO 10/02/15 EF 25-30% RV normal AK entire anteroseptal and inferoseptal myocardium.  TEE 10/27/2015: EF 35-40% RV mildly dilated TR mod regurg.  ECHO 10/30/15: EF 25-30%  LHC/RHC/IABP placement 3/27 Left Anterior Descending  Native left coronary was not injected to save contrast. LIMA-LAD was patent. SVG-small diagonal was patent. There was marked size mismatch between graft and artery with resulting slow flow. There  appeared to be about 80% stenosis in the diagonal distal to touchdown of graft.      Ramus Intermedius  Native LCA not injected to save contrast. SVG-ramus patent. There was a size mismatch between large vein graft and smaller caliber native artery. There appeared to be 80% stenosis in the ramus distal to the SVG touchdown and 70% stenosis proximal to SVG touchdown.     Left Circumflex  Not visualized, native LCA not injected to save contrast.     Right Coronary Artery  Native RCA known to be occluded, not injected. SVG-PDA patent.       Right Heart Pressures Procedural Findings: Hemodynamics (mmHg), on milrinone 0.5 RA mean 19 RV 44/23 PA 43/25, mean 33 PCWP mean 26 AO 124/71 Oxygen saturations: PA 64% AO 100% Cardiac Output (Fick) 6.52  Cardiac Index (Fick) 2.86 Cardiac Output (Thermo) 4.97 Cardiac Index (Thermo) 2.18    Scheduled Meds: . amiodarone  150 mg Intravenous Once  . antiseptic oral rinse  7 mL Mouth Rinse QID  . aspirin  325 mg Per Tube Daily  . atorvastatin  80 mg Oral q1800  . bisacodyl  10 mg Oral Daily   Or  . bisacodyl  10 mg Rectal Daily  . chlorhexidine gluconate (SAGE KIT)  15 mL Mouth Rinse BID  . feeding supplement (PRO-STAT SUGAR FREE 64)  30 mL Per Tube TID  . fentaNYL (SUBLIMAZE) injection  25 mcg Intravenous Once  . insulin aspart  0-24 Units Subcutaneous 6 times per day  . insulin  detemir  12 Units Subcutaneous BID  . levalbuterol  1.25 mg Nebulization Q6H WA  . levofloxacin (LEVAQUIN) IV  750 mg Intravenous Q48H  . methylPREDNISolone (SOLU-MEDROL) injection  40 mg Intravenous Q12H  . metolazone  10 mg Oral Daily  . midazolam  1 mg Intravenous Once  . pantoprazole (PROTONIX) IV  40 mg Intravenous Daily  . potassium chloride  40 mEq Oral 3 times per day  . sennosides  5 mL Per Tube BID  . sildenafil  20 mg Oral TID  . silver sulfADIAZINE   Topical BID  . sodium chloride flush  10-40 mL Intracatheter Q12H  . vancomycin  1,250 mg  Intravenous Q48H   Continuous Infusions: . sodium chloride 10 mL/hr at 11/07/15 2230  . amiodarone 30 mg/hr (11/08/15 0545)  . feeding supplement (VITAL HIGH PROTEIN) 1,000 mL (11/08/15 0809)  . fentaNYL infusion INTRAVENOUS 300 mcg/hr (11/08/15 0630)  . furosemide (LASIX) infusion 10 mg/hr (11/07/15 1600)  . heparin 1,200 Units/hr (11/07/15 1600)  . midazolam (VERSED) infusion 5 mg/hr (11/08/15 0400)  . milrinone 0.25 mcg/kg/min (11/08/15 0345)  . norepinephrine (LEVOPHED) Adult infusion Stopped (11/01/15 1930)   PRN Meds:.acetaminophen, diphenhydrAMINE, fentaNYL, metoprolol, midazolam, ondansetron (ZOFRAN) IV, ondansetron (ZOFRAN) IV, sodium chloride flush, traMADol    Filed Vitals:   11/08/15 0500 11/08/15 0600 11/08/15 0700 11/08/15 0709  BP:      Pulse: 92 92 92   Temp:    97.1 F (36.2 C)  TempSrc:    Axillary  Resp: 15 14 16    Height:      Weight:      SpO2: 97% 91% 96%     Intake/Output Summary (Last 24 hours) at 11/08/15 0830 Last data filed at 11/08/15 0700  Gross per 24 hour  Intake 4425.87 ml  Output   3180 ml  Net 1245.87 ml    LABS: Basic Metabolic Panel:  Recent Labs  11/07/15 0337 11/08/15 0400  NA 138 141  K 3.9 3.6  CL 93* 96*  CO2 31 31  GLUCOSE 131* 119*  BUN 106* 118*  CREATININE 3.54* 3.71*  CALCIUM 8.6* 8.6*  MG 2.7* 2.7*  PHOS 6.0* 6.2*   Liver Function Tests:  Recent Labs  11/07/15 0337 11/08/15 0400  AST 45* 44*  ALT 57 49  ALKPHOS 86 86  BILITOT 1.4* 1.1  PROT 6.4* 6.3*  ALBUMIN 2.7* 2.5*   No results for input(s): LIPASE, AMYLASE in the last 72 hours. CBC:  Recent Labs  11/07/15 0337 11/08/15 0400  WBC 13.5* 12.7*  HGB 8.6* 8.5*  HCT 28.1* 27.6*  MCV 90.9 91.4  PLT 234 275   Cardiac Enzymes: No results for input(s): CKTOTAL, CKMB, CKMBINDEX, TROPONINI in the last 72 hours. BNP: Invalid input(s): POCBNP D-Dimer: No results for input(s): DDIMER in the last 72 hours. Hemoglobin A1C: No results for  input(s): HGBA1C in the last 72 hours. Fasting Lipid Panel: No results for input(s): CHOL, HDL, LDLCALC, TRIG, CHOLHDL, LDLDIRECT in the last 72 hours. Thyroid Function Tests: No results for input(s): TSH, T4TOTAL, T3FREE, THYROIDAB in the last 72 hours.  Invalid input(s): FREET3 Anemia Panel: No results for input(s): VITAMINB12, FOLATE, FERRITIN, TIBC, IRON, RETICCTPCT in the last 72 hours.  RADIOLOGY: Dg Chest 2 View  10/23/2015  CLINICAL DATA:  CHF.  Weakness. EXAM: CHEST  2 VIEW COMPARISON:  10/21/2015 FINDINGS: Multiple interstitial densities in the lower chest are suggestive for interstitial edema, right side greater than left. Heart size is upper limits of normal. Trachea  is midline. Negative for a pneumothorax. Evidence for a small right pleural effusion. IMPRESSION: Interstitial densities in the lower chest, right side greater than left. Findings are suggestive for interstitial pulmonary edema. Difficult to exclude some airspace disease in the right lower lung. Evidence for small right pleural effusion. Electronically Signed   By: Markus Daft M.D.   On: 10/23/2015 08:27   Dg Abd 1 View  11/03/2015  CLINICAL DATA:  Enteric tube advanced to post pyloric position. EXAM: ABDOMEN - 1 VIEW COMPARISON:  11/02/2015 abdominal radiographs. FINDINGS: Fluoroscopy time 7 minutes. Enteric tube courses through the stomach and duodenum and appears to terminate near the duodenal jejunal junction. Instilled contrast opacifies nondilated proximal jejunal small bowel loops. No contrast leak is demonstrated. Separate enteric tube terminates in the body of the stomach. Visualized lower sternotomy wires appear aligned and intact. IMPRESSION: Enteric tube appears to terminate near the duodenal jejunal junction. Separate enteric tube terminates in the body of the stomach. Electronically Signed   By: Ilona Sorrel M.D.   On: 11/03/2015 17:02   Dg Chest Port 1 View  11/06/2015  CLINICAL DATA:  Acute CHF secondary to  acute MI, complete heart block. EXAM: PORTABLE CHEST 1 VIEW COMPARISON:  Portable chest x-ray of November 05, 2015 FINDINGS: The lungs are adequately inflated. The pulmonary interstitial markings are less prominent on the right today. They have improved on the left as well and the hemidiaphragm is now visible. There is persistent interstitial edema on the left. The cardiac silhouette remains enlarged. The intraaortic balloon pump marker tip projects over the proximal portion of descending thoracic aorta and appears stable. The endotracheal tube tip projects 5.5 cm above the carina. The nasogastric and Doppler AH feeding tubes have their tips below the inferior margin of the image. The right-sided PICC line tip projects over the junction of the middle and distal thirds of the SVC. The left subclavian venous catheter tip projects over the midportion of the SVC. IMPRESSION: Decreasing pulmonary edema consistent with improvement in CHF. Persistent perihilar interstitial edema on the left. The support tubes and devices are in reasonable position. Electronically Signed   By: David  Martinique M.D.   On: 11/06/2015 07:32   Dg Chest Port 1 View  11/05/2015  CLINICAL DATA:  Respiratory failure. EXAM: PORTABLE CHEST 1 VIEW COMPARISON:  11/04/2015 . FINDINGS: Endotracheal tube, NG tube, right PICC line stable position. Prior CABG. Cardiomegaly. Persistent left mid and lower lung field infiltrate. Persistent mild atelectatic changes right upper and right lower lobes. No pneumothorax. IMPRESSION: 1.  Lines and tubes in stable position. 2. Persistent left mid and left lower lung infiltrate, no interim change. Persistent right upper lobe and right lower lobe subsegmental atelectasis. No interim change. 3. Prior CABG.  Stable cardiomegaly. Electronically Signed   By: Marcello Moores  Register   On: 11/05/2015 07:48   Dg Chest Port 1 View  11/04/2015  CLINICAL DATA:  Hypoxia EXAM: PORTABLE CHEST 1 VIEW COMPARISON:  November 04, 2015 FINDINGS:  Endotracheal tube tip is 5.3 cm above the carina. Central catheter tip is in the superior vena cava near the cavoatrial junction. Nasogastric tube tip and side port are below the diaphragm. There also is a feeding tube with the tip below the diaphragm. No pneumothorax. There has been removal of a left chest tube. There is airspace consolidation throughout the left mid and lower lung zones, stable. There is mild atelectasis in the right upper lobe, stable. No new opacity is evident. There is stable cardiomegaly.  The pulmonary vascularity is normal. No adenopathy is evident. IMPRESSION: Tube and catheter positions as described without pneumothorax. Airspace consolidation throughout the left mid and lower lung zones, stable. Atelectasis right upper lobe, stable. No new opacity. Stable cardiomegaly. Electronically Signed   By: Lowella Grip III M.D.   On: 11/04/2015 14:52   Dg Chest Port 1 View  11/04/2015  CLINICAL DATA:  Status post CABG 10/28/2015. EXAM: PORTABLE CHEST 1 VIEW COMPARISON:  Single view of the chest 11/03/2015 and 11/02/2015. FINDINGS: Support tubes and lines including a left chest tube are unchanged since the most recent exam. The chest is better expanded today with decreased atelectasis. Airspace opacity in the left mid and lower lung zones persists. There is likely a left pleural effusion. Marked cardiomegaly is seen with only mild vascular congestion noted. IMPRESSION: Increased pulmonary expansion with decreased scattered atelectasis. Left mid and lower lung zone airspace disease could be due to atelectasis or pneumonia. Small left pleural effusion noted. Support tubes and lines projecting good position.  No pneumothorax. Electronically Signed   By: Inge Rise M.D.   On: 11/04/2015 07:30   Dg Chest Port 1 View  11/03/2015  CLINICAL DATA:  Central line placement. EXAM: PORTABLE CHEST 1 VIEW COMPARISON:  11/03/2015. FINDINGS: Left subclavian central line noted with tip at the  cavoatrial junction. Interval removal of right chest tube. Endotracheal tube, NG tube, right PICC line, left IJ sheath left chest tube in stable position. No pneumothorax. Prior CABG. Stable cardiomegaly. Low lung volumes. A mild component of congestive heart failure cannot be excluded. Similar findings noted on prior exam P IMPRESSION: 1. Interim placement of left subclavian central line, its tip is at the cavoatrial junction. Interim removal right chest tube . No pneumothorax. 2. Remaining lines and tubes including left chest tube in stable position. 3. Prior CABG. Stable cardiomegaly. Low lung volumes. A a mild component congestive heart failure cannot be excluded . Similar findings noted on prior exam. Electronically Signed   By: [place]   On: 11/03/2015 09:58   Dg Chest Port 1 View  11/03/2015  CLINICAL DATA:  Status post CABG on October 27, 2015 persistent left lower lobe atelectasis or pneumonia. No pneumothorax or significant pleural effusion. EXAM: PORTABLE CHEST 1 VIEW COMPARISON:  Portable chest x-ray of November 02, 2015 at 5:48 a.m. FINDINGS: There has been interval placement of an intra aortic balloon pump whose marker lies over the junction of the aortic arch with the proximal descending thoracic aorta. The cardiac silhouette remains enlarged. The pulmonary vascularity remains mildly prominent. The retrocardiac region on the left remains dense. Bilateral chest tubes are in stable position. There is no pneumothorax nor significant pleural effusion. The endotracheal tube tip lies approximately 6.5 cm above the carina. The esophagogastric tube tip projects below the inferior margin of the image. The right-sided PICC line tip projects over the midportion of the SVC. A previously demonstrated pericardial drain is not clearly evident on today's study. IMPRESSION: Mild CHF. Persistent left lower lobe atelectasis or pneumonia. Interval placement of an intra aortic balloon pump which appears to be in  appropriate position radiographically. The other support tubes and lines are in reasonable position. Electronically Signed   By: David  Martinique M.D.   On: 11/03/2015 07:27   Dg Chest Port 1 View  11/02/2015  CLINICAL DATA:  CABG. EXAM: PORTABLE CHEST 1 VIEW COMPARISON:  11/01/2015. FINDINGS: Endotracheal tube, NG tube, right PICC line, bilateral chest tubes in stable position. Prior CABG. Stable  cardiomegaly . Persistent mild interstitial prominence, congestive heart failure cannot be excluded. No interim change. Low lung volumes with basilar atelectasis . IMPRESSION: 1. Endotracheal tube, NG tube, right PICC line, bilateral chest tubes in stable position . No pneumothorax. 2. Prior CABG. Cardiomegaly with pulmonary interstitial prominence and small left pleural effusion consistent with mild congestive heart failure. No interim change from prior exam . Electronically Signed   By: Marcello Moores  Register   On: 11/02/2015 07:20   Dg Chest Port 1 View  11/01/2015  CLINICAL DATA:  Post CABG EXAM: PORTABLE CHEST 1 VIEW COMPARISON:  10/31/2015 FINDINGS: Cardiomediastinal silhouette is stable. Status post CABG. Stable endotracheal and NG tube position. Left chest tube is unchanged in position. Persistent central vascular congestion and mild interstitial prominence bilaterally suspicious for the mild interstitial edema. Left IJ sheath in place. Probable small left pleural effusion left basilar atelectasis or infiltrate. Right arm PICC line is unchanged in position. There is no pneumothorax. IMPRESSION: Status post CABG. Stable support apparatus. Stable left chest tube position. No pneumothorax. Again noted bilateral mild interstitial prominence and perihilar opacities suspicious for mild pulmonary edema. Probable small left pleural effusion left basilar atelectasis or infiltrate. Electronically Signed   By: Lahoma Crocker M.D.   On: 11/01/2015 09:57   Dg Chest Port 1 View  10/31/2015  CLINICAL DATA:  55 year old male with a  history of endotracheal tube placement Patient has undergone coronary artery bypass grafting x4 10/27/2015. Placement of intra aortic balloon pump at this date. Ischemic cardiomyopathy. EXAM: PORTABLE CHEST 1 VIEW COMPARISON:  10/30/2015. FINDINGS: Endotracheal tube terminates approximately 3.4 cm above the carina, unchanged. Gastric tube terminates out of the field of view. Defibrillator pads project over the left and right chest. Bilateral thoracostomy tubes. Right upper extremity PICC. The mediastinal drain not visualized. Intra-aortic balloon pump not visualized. Surgical changes of median sternotomy and CABG. Mixed bilateral interstitial and airspace opacities. Low lung volumes. IMPRESSION: Low lung volumes with mixed bilateral interstitial and airspace opacities, similar to the comparison chest x-ray. Retrocardiac opacity may reflect persistent pleural fluid, and/or atelectasis. Surgical support apparatus appear unchanged, as above. Intra aortic balloon pump not visualized. Signed, Dulcy Fanny. Earleen Newport, DO [facility] Electronically Signed   By: Corrie Mckusick D.O.   On: 10/31/2015 07:25   Dg Chest Port 1 View  10/30/2015  CLINICAL DATA:  Post cavity.  Recent CABG. EXAM: PORTABLE CHEST 1 VIEW COMPARISON:  Portable film earlier today. FINDINGS: Overlying support apparatus redemonstrated. Tubes and lines remain stable. Elevated LEFT hemidiaphragm. No definite pneumothorax. Moderate vascular congestion may be increased. Cardiomegaly. IMPRESSION: Slight worsening aeration. Moderate vascular congestion may be increased. Electronically Signed   By: Staci Righter M.D.   On: 10/30/2015 18:39   Dg Chest Port 1 View  10/30/2015  CLINICAL DATA:  Status post coronary bypass grafting EXAM: PORTABLE CHEST 1 VIEW COMPARISON:  10/30/2015 FINDINGS: Cardiac shadow remains enlarged. Bilateral chest tubes are again seen. A right-sided PICC line and endotracheal tube are  noted in satisfactory position. Left jugular sheath is noted. Elevation of left hemidiaphragm is noted with left basilar atelectasis. No pneumothorax is noted. IMPRESSION: Postsurgical change with tubes and lines as described. Mild left basilar atelectasis. Electronically Signed   By: Inez Catalina M.D.   On: 10/30/2015 07:45   Dg Chest Port 1 View  10/30/2015  CLINICAL DATA:  A chest tube in place EXAM: PORTABLE CHEST 1 VIEW COMPARISON:  10/30/2015 FINDINGS: Postoperative changes in the mediastinum. Endotracheal  tube with tip measuring 6.3 cm about the carina. Right PICC catheter with tip over the cavoatrial junction. Left central venous catheter sheath with tip over the left neck consistent location in the internal jugular vein. Bilateral chest tubes. Infiltration or atelectasis in the right lung base. Cardiac enlargement. No significant vascular congestion. No pneumothorax. IMPRESSION: Appliance positioned as described. Cardiac enlargement with infiltration or atelectasis in the right lung base. Bilateral chest tubes are present but no visualized pneumothorax. Electronically Signed   By: Lucienne Capers M.D.   On: 10/30/2015 03:07   Dg Chest Port 1 View  10/30/2015  CLINICAL DATA:  Shortness of breath.  Code blue. EXAM: PORTABLE CHEST 1 VIEW COMPARISON:  10/29/2015 FINDINGS: Postoperative changes in the mediastinum. Bilateral chest tubes are present. Right PICC line with tip over the cavoatrial junction. Left central venous catheter or sheath with tip over the left side of the neck, likely in the left internal jugular vein. Cardiac enlargement without significant vascular congestion. Shallow inspiration with elevation of the left hemidiaphragm. Probable left pleural effusion. No definite pulmonary consolidation. IMPRESSION: Shallow inspiration. Probable left pleural effusion. Cardiac enlargement. Appliances appear in satisfactory position. Electronically Signed   By: Lucienne Capers M.D.   On: 10/30/2015  01:18   Dg Chest Port 1 View  10/29/2015  CLINICAL DATA:  Postop CABG 2 days ago. EXAM: PORTABLE CHEST 1 VIEW COMPARISON:  Portable chest x-ray of October 28, 2015 FINDINGS: The trachea and esophagus have been extubated. The cardiac silhouette remains enlarged. The pulmonary vascularity is mildly engorged. The bilateral chest tubes and the mediastinal drain are in stable position. There is no pneumothorax or large pleural effusion. The Swan-Ganz catheter tip overlies a proximal right lower lobe pulmonary artery branch. The mediastinum is widened and accentuated by the hypo inflation. IMPRESSION: Bilateral hypoinflation following extubation of the trachea there is crowding of the pulmonary vascularity. Left basilar atelectasis or less likely pneumonia is more prominent today. Probable small left pleural effusion. The remaining support tubes and lines are in stable position. Electronically Signed   By: David  Martinique M.D.   On: 10/29/2015 07:40   Dg Chest Port 1 View  10/28/2015  CLINICAL DATA:  Post CABG, on ventilator, followup portable chest x-ray of 10/28/2015 EXAM: PORTABLE CHEST 1 VIEW COMPARISON:  None. FINDINGS: The tip of the endotracheal tube is approximately 5.2 cm above the carina. Aeration has improved slightly. Atelectasis remains at the lung bases left-greater-than-right. Swan-Ganz catheter tip is in the right lower lobe pulmonary artery and bilateral chest tubes remain. No pneumothorax is seen. Heart size is stable. IMPRESSION: 1. Improved aeration. 2. Bilateral chest tubes.  No pneumothorax. 3. Tip of endotracheal tube approximately 5.2 cm above the carina. Electronically Signed   By: Ivar Drape M.D.   On: 10/28/2015 08:01   Dg Chest Port 1 View  10/27/2015  CLINICAL DATA:  Status post CABG EXAM: PORTABLE CHEST 1 VIEW COMPARISON:  10/27/2015 FINDINGS: Unchanged tracheostomy tube. Swan-Ganz central venous line is now seen with tip advanced into the right perihilar region. Stable right chest tube  with and left chest tube. NG tube again crosses the gastroesophageal junction. Stable mediastinal drain. Enlarged cardiac silhouette. Limited inspiratory effect with bibasilar opacities likely representing atelectasis. IMPRESSION: Anticipated postoperative appearance with bibasilar atelectasis. Electronically Signed   By: Skipper Cliche M.D.   On: 10/27/2015 19:25   Dg Chest Portable 1 View  10/27/2015  CLINICAL DATA:  Status post coronary bypass grafting EXAM: PORTABLE CHEST 1 VIEW COMPARISON:  10/23/2015 FINDINGS: Cardiac shadow is mildly enlarged. A Swan-Ganz catheter is noted in the right pulmonary outflow tract. Bilateral thoracostomy catheters are seen. No pneumothorax is noted. An endotracheal tube is noted in satisfactory position. Intra-aortic balloon pump is noted over the aortic arch. Mild left basilar atelectasis is seen. Mild central vascular congestion is noted. IMPRESSION: Postoperative changes with tubes and lines as described above. Mild vascular congestion and left basilar atelectasis. Electronically Signed   By: Inez Catalina M.D.   On: 10/27/2015 17:23   Portable Chest X-ray 1 View  10/21/2015  CLINICAL DATA:  55 year old male with a history of acute systolic congestive heart failure EXAM: PORTABLE CHEST 1 VIEW COMPARISON:  10/20/2015 FINDINGS: Heart size unchanged, enlarged. Similar appearance of low lung volumes with interstitial opacities, interlobular septal thickening, and developing airspace disease of the bilateral lower lungs. No pneumothorax. Linear opacity overlying the mediastinum and the heart border, of on certain significance. Favored to overlie the patient. IMPRESSION: Evidence of worsening congestive heart failure, with increasing edema. New linear opacity overlies the mediastinum, favored to be overlying the patient, though cannot be localized on this view. Signed, Dulcy Fanny. Earleen Newport, DO [facility] Electronically  Signed   By: Corrie Mckusick D.O.   On: 10/21/2015 07:16   Dg Chest Portable 1 View  10/20/2015  CLINICAL DATA:  Shortness of breath. EXAM: PORTABLE CHEST 1 VIEW COMPARISON:  None. FINDINGS: Midline trachea. Cardiomegaly accentuated by AP portable technique. No pleural effusion or pneumothorax. Mildly low lung volumes with bibasilar atelectasis. IMPRESSION: Mild cardiomegaly and low lung volumes.  No acute findings. Electronically Signed   By: Abigail Miyamoto M.D.   On: 10/20/2015 18:09   Dg Abd Portable 1v  11/02/2015  CLINICAL DATA:  Assess feeding tube positioning EXAM: PORTABLE ABDOMEN - 1 VIEW COMPARISON:  None in PACs FINDINGS: The repeated KUB with has less motion artifact. The tip of the feeding tube and nasogastric tube lie in the region of the distal gastric body in the pre-pyloric region. The bowel gas pattern is within the limits of normal. Numerous tubes and lines and electrodes overlie the abdomen. IMPRESSION: The tips of the feeding tube and the esophagogastric tube lie in the region of the distal gastric body in the pre-pyloric region. Electronically Signed   By: David  Martinique M.D.   On: 11/02/2015 12:16   Dg Addison Bailey G Tube Plc W/fl-no Rad  11/03/2015  CLINICAL DATA:  NASO G TUBE PLACEMENT WITH FLUORO Fluoroscopy was utilized by the requesting physician.  No radiographic interpretation.    PHYSICAL EXAM General: Awake on vent Neck: JVP hard to see.  no thyromegaly or thyroid nodule.  Lungs: Decreased breath sounds at bases bilaterally.  CV: Nondisplaced PMI.  Heart regular S1/S2, no S3/S4, no murmur.  3+ edema to knees bilaterally. Abdomen: Soft, nontender, no hepatosplenomegaly, no distention.  Neurologic: Alert and follows commands.  Psych: Normal affect. Extremities: No clubbing or cyanosis. Right groin IABP site stable. Dopplerable pedal pulses. Maculopapular rash on his sides  TELEMETRY: Reviewed telemetry, AV pacing 90   ASSESSMENT AND PLAN:  1. CAD: S/p late presentation  inferior MI and CABG x 4. Continue statin, ASA. Coronary angiography 3/27 with multiple VT episodes showed patent LIMA-LAD and SVG-RCA.  Patent SVG-ramus and SVG-D but slow flow in both grafts with diffusely diseased small target vessels.  2. Cardiogenic shock: Acute systolic CHF with RV failure post inferior MI with suspected RV involvement. EF 25-30% on echo (difficult images on  3/24 echo). He has significant RV failure from suspected RV infarct.  3/27 IABP placed to try to allow decrease in milrinone with frequent VT.  Good co-ox this morning at 67%. IABP augmentation not above unaugmented SBP.   CVP going up despite increase lasix gtt.  - Volume status getting worse despite increasing lasix gtt. Agree with metolazone. Agree with Dr. Roxan Hockey that he will likely need CVVHD - Increase lasix at 20 mg/hr for now, - Continue NO for now and increase sildenafil. Try to wean NO.  - Continue milrinone at 0.25 - IABP likely not adding much based on augmentation pattermn Will wean IABP to 1:3  Today. Recheck co-ox at 11a. If co-ox and rhythm remain stable. Will d/c today 3. Complete heart block:  - He is back in NSR in 70s underneath with good AV conduction but frequent PVCs/bigeminy. Will leave AV pacing at 90 now to overdrive PVCs and avoid risk of recurrent VT for now. Can wean pacing over next few days as more stable 4. VF arrest 3/24 early am and again at night 3/24. VT overnight 3/26 and during the day 3/27.  K now better and on lower milrinone dose.  He is off lidocaine and procainamide.  - Continue amiodarone.  - K is low. Will supp.  5. AKI: Creatinine up again today.  Got minimal contrast (30 cc) with cath on 3/27.  Suspect ATN. Baseline creatinine 1.0. May need CVVHD for volume removal. Will increase lasix and add metolazone. Would consult Renal..  6. ID: Possible LLL PNA, elevated PCT.  Abx per TCTS/CCM 7. Rash - possible drug rash   The patient is critically ill with multiple organ  systems failure and requires high complexity decision making for assessment and support, frequent evaluation and titration of therapies, application of advanced monitoring technologies and extensive interpretation of multiple databases.   Critical Care Time devoted to patient care services described in this note is 45 Minutes.    Roux Brandy, Quillian Quince 11/08/2015 8:30 AM

## 2015-11-08 NOTE — Progress Notes (Addendum)
6 Days Post-Op Procedure(s) (LRB): Right/Left Heart Cath and Coronary/Graft Angiography (N/A) Intra-Aortic Balloon Pump Insertion Subjective: Intubated, sedated  Objective: Vital signs in last 24 hours: Temp:  [97.1 F (36.2 C)-97.9 F (36.6 C)] 97.1 F (36.2 C) (04/02 0709) Pulse Rate:  [44-92] 92 (04/02 0700) Cardiac Rhythm:  [-] A-V Sequential paced (04/01 2000) Resp:  [13-22] 16 (04/02 0700) BP: (104-115)/(54-58) 107/56 mmHg (04/01 1629) SpO2:  [91 %-100 %] 96 % (04/02 0700) Arterial Line BP: (98-133)/(53-67) 110/64 mmHg (04/02 0700) FiO2 (%):  [50 %] 50 % (04/02 0333) Weight:  [253 lb 12 oz (115.1 kg)] 253 lb 12 oz (115.1 kg) (04/02 0425)  Hemodynamic parameters for last 24 hours: CVP:  [11 mmHg-22 mmHg] 21 mmHg  Intake/Output from previous day: 04/01 0701 - 04/02 0700 In: 4639.8 [P.O.:10; I.V.:2172.3; NG/GT:1970; IV Piggyback:487.5] Out: 3430 [Urine:2680; Emesis/NG output:750] Intake/Output this shift:    General appearance: intubated Neurologic: sedated Heart: regular rate and rhythm Lungs: rhonchi bilaterally Abdomen: distended rash unchanged, still edematous  Lab Results:  Recent Labs  11/07/15 0337 11/08/15 0400  WBC 13.5* 12.7*  HGB 8.6* 8.5*  HCT 28.1* 27.6*  PLT 234 275   BMET:  Recent Labs  11/07/15 0337 11/08/15 0400  NA 138 141  K 3.9 3.6  CL 93* 96*  CO2 31 31  GLUCOSE 131* 119*  BUN 106* 118*  CREATININE 3.54* 3.71*  CALCIUM 8.6* 8.6*    PT/INR: No results for input(s): LABPROT, INR in the last 72 hours. ABG    Component Value Date/Time   PHART 7.454* 11/08/2015 0401   HCO3 31.4* 11/08/2015 0401   TCO2 32.8 11/08/2015 0401   ACIDBASEDEF TEST WILL BE CREDITED 11/01/2015 0446   O2SAT 97.0 11/08/2015 0401   CBG (last 3)   Recent Labs  11/07/15 2025 11/08/15 0019 11/08/15 0416  GLUCAP 111* 139* 107*    Assessment/Plan: S/P Procedure(s) (LRB): Right/Left Heart Cath and Coronary/Graft Angiography (N/A) Intra-Aortic  Balloon Pump Insertion -  CV- co-ox 66%. On IABP + milrinone  No VT on amiodarone  Still pacer dependent  RESP- thick secretions- on vanco and levaquin- will reculture  VDRF- vent per CCM  RENAL- 1200 ml + yesterday, weight up 2 pounds, creatinine up  Modest response with lasix, will give a dose of zaroxylyn but I suspect he will need CVVH  ID- on vanco and levaquin empirically  Rash- severe on flanks- fortaz and zosyn dc'ed, will give benadryl and a couple of doses of solumedrol  CBG well controlled  LOS: 19 days    Loreli Slot 11/08/2015

## 2015-11-09 ENCOUNTER — Inpatient Hospital Stay (HOSPITAL_COMMUNITY): Payer: BLUE CROSS/BLUE SHIELD

## 2015-11-09 LAB — GLUCOSE, CAPILLARY
GLUCOSE-CAPILLARY: 147 mg/dL — AB (ref 65–99)
GLUCOSE-CAPILLARY: 148 mg/dL — AB (ref 65–99)
GLUCOSE-CAPILLARY: 148 mg/dL — AB (ref 65–99)
GLUCOSE-CAPILLARY: 158 mg/dL — AB (ref 65–99)
Glucose-Capillary: 177 mg/dL — ABNORMAL HIGH (ref 65–99)
Glucose-Capillary: 157 mg/dL — ABNORMAL HIGH (ref 65–99)

## 2015-11-09 LAB — CBC
HCT: 28 % — ABNORMAL LOW (ref 39.0–52.0)
Hemoglobin: 8.9 g/dL — ABNORMAL LOW (ref 13.0–17.0)
MCH: 29.3 pg (ref 26.0–34.0)
MCHC: 31.8 g/dL (ref 30.0–36.0)
MCV: 92.1 fL (ref 78.0–100.0)
Platelets: 309 10*3/uL (ref 150–400)
RBC: 3.04 MIL/uL — ABNORMAL LOW (ref 4.22–5.81)
RDW: 17 % — ABNORMAL HIGH (ref 11.5–15.5)
WBC: 12.3 10*3/uL — ABNORMAL HIGH (ref 4.0–10.5)

## 2015-11-09 LAB — POCT I-STAT, CHEM 8
BUN: 139 mg/dL — AB (ref 6–20)
CHLORIDE: 94 mmol/L — AB (ref 101–111)
CREATININE: 4.1 mg/dL — AB (ref 0.61–1.24)
Calcium, Ion: 1.14 mmol/L (ref 1.12–1.23)
GLUCOSE: 175 mg/dL — AB (ref 65–99)
HEMATOCRIT: 29 % — AB (ref 39.0–52.0)
HEMOGLOBIN: 9.9 g/dL — AB (ref 13.0–17.0)
POTASSIUM: 4.2 mmol/L (ref 3.5–5.1)
Sodium: 140 mmol/L (ref 135–145)
TCO2: 32 mmol/L (ref 0–100)

## 2015-11-09 LAB — COMPREHENSIVE METABOLIC PANEL
ALT: 52 U/L (ref 17–63)
AST: 49 U/L — ABNORMAL HIGH (ref 15–41)
Albumin: 2.7 g/dL — ABNORMAL LOW (ref 3.5–5.0)
Alkaline Phosphatase: 89 U/L (ref 38–126)
Anion gap: 15 (ref 5–15)
BUN: 140 mg/dL — ABNORMAL HIGH (ref 6–20)
CO2: 31 mmol/L (ref 22–32)
Calcium: 9.2 mg/dL (ref 8.9–10.3)
Chloride: 96 mmol/L — ABNORMAL LOW (ref 101–111)
Creatinine, Ser: 3.83 mg/dL — ABNORMAL HIGH (ref 0.61–1.24)
GFR calc Af Amer: 19 mL/min — ABNORMAL LOW (ref >60)
GFR calc non Af Amer: 16 mL/min — ABNORMAL LOW (ref >60)
Glucose, Bld: 178 mg/dL — ABNORMAL HIGH (ref 65–99)
Potassium: 4 mmol/L (ref 3.5–5.1)
Sodium: 142 mmol/L (ref 135–145)
Total Bilirubin: 1.1 mg/dL (ref 0.3–1.2)
Total Protein: 6.7 g/dL (ref 6.5–8.1)

## 2015-11-09 LAB — BLOOD GAS, ARTERIAL
Acid-Base Excess: 6.8 mmol/L — ABNORMAL HIGH (ref 0.0–2.0)
Bicarbonate: 31.3 mEq/L — ABNORMAL HIGH (ref 20.0–24.0)
DRAWN BY: 419771
FIO2: 0.5
MECHVT: 650 mL
O2 Saturation: 89.9 %
PEEP: 10 cmH2O
Patient temperature: 98.6
RATE: 14 resp/min
TCO2: 32.8 mmol/L (ref 0–100)
pCO2 arterial: 49.1 mmHg — ABNORMAL HIGH (ref 35.0–45.0)
pH, Arterial: 7.421 (ref 7.350–7.450)
pO2, Arterial: 62.1 mmHg — ABNORMAL LOW (ref 80.0–100.0)

## 2015-11-09 LAB — URINALYSIS, ROUTINE W REFLEX MICROSCOPIC
Bilirubin Urine: NEGATIVE
GLUCOSE, UA: NEGATIVE mg/dL
Hgb urine dipstick: NEGATIVE
Ketones, ur: NEGATIVE mg/dL
LEUKOCYTES UA: NEGATIVE
NITRITE: NEGATIVE
PH: 5 (ref 5.0–8.0)
Protein, ur: NEGATIVE mg/dL
SPECIFIC GRAVITY, URINE: 1.014 (ref 1.005–1.030)

## 2015-11-09 LAB — CARBOXYHEMOGLOBIN
Carboxyhemoglobin: 1.3 % (ref 0.5–1.5)
Methemoglobin: 0.9 % (ref 0.0–1.5)
O2 Saturation: 66.8 %
Total hemoglobin: 7.7 g/dL — ABNORMAL LOW (ref 13.5–18.0)

## 2015-11-09 LAB — PHOSPHORUS: Phosphorus: 6.7 mg/dL — ABNORMAL HIGH (ref 2.5–4.6)

## 2015-11-09 LAB — MAGNESIUM: Magnesium: 3 mg/dL — ABNORMAL HIGH (ref 1.7–2.4)

## 2015-11-09 MED ORDER — HYDRALAZINE HCL 25 MG PO TABS
12.5000 mg | ORAL_TABLET | Freq: Three times a day (TID) | ORAL | Status: DC
Start: 2015-11-09 — End: 2015-11-10
  Administered 2015-11-09 – 2015-11-10 (×4): 12.5 mg via ORAL
  Filled 2015-11-09 (×4): qty 1

## 2015-11-09 MED ORDER — METOLAZONE 5 MG PO TABS
5.0000 mg | ORAL_TABLET | Freq: Every day | ORAL | Status: DC
Start: 2015-11-09 — End: 2015-11-11
  Administered 2015-11-09 – 2015-11-10 (×2): 5 mg via ORAL
  Filled 2015-11-09 (×2): qty 1

## 2015-11-09 MED ORDER — TRIAMCINOLONE ACETONIDE 0.5 % EX OINT
TOPICAL_OINTMENT | Freq: Two times a day (BID) | CUTANEOUS | Status: DC
  Administered 2015-11-09: 10:00:00 via TOPICAL
  Filled 2015-11-09: qty 15

## 2015-11-09 MED ORDER — TRIAMCINOLONE ACETONIDE 0.5 % EX CREA
TOPICAL_CREAM | Freq: Two times a day (BID) | CUTANEOUS | Status: DC
  Administered 2015-11-09: 1 via TOPICAL
  Administered 2015-11-10 – 2015-11-11 (×4): via TOPICAL
  Administered 2015-11-12: 1 via TOPICAL
  Administered 2015-11-12: 10:00:00 via TOPICAL
  Administered 2015-11-13: 1 via TOPICAL
  Administered 2015-11-13: 22:00:00 via TOPICAL
  Administered 2015-11-14: 1 via TOPICAL
  Administered 2015-11-14 – 2015-11-15 (×2): via TOPICAL
  Administered 2015-11-15: 1 via TOPICAL
  Administered 2015-11-16: 22:00:00 via TOPICAL
  Administered 2015-11-16: 1 via TOPICAL
  Administered 2015-11-17: 22:00:00 via TOPICAL
  Administered 2015-11-18 – 2015-11-19 (×3): 1 via TOPICAL
  Administered 2015-11-20 – 2015-11-21 (×3): via TOPICAL
  Administered 2015-11-21: 1 via TOPICAL
  Administered 2015-11-22 – 2015-11-23 (×3): via TOPICAL
  Administered 2015-11-23: 1 via TOPICAL
  Administered 2015-11-24: 09:00:00 via TOPICAL
  Filled 2015-11-09: qty 15

## 2015-11-09 MED ORDER — ISOSORBIDE MONONITRATE ER 30 MG PO TB24
30.0000 mg | ORAL_TABLET | Freq: Every day | ORAL | Status: DC
  Administered 2015-11-09 – 2015-11-10 (×2): 30 mg via ORAL
  Filled 2015-11-09 (×2): qty 1

## 2015-11-09 NOTE — Consult Note (Signed)
Christopher [name] is an 55 y.o. male referred by Dr Donata Clay   Chief Complaint: ARF HPI: 55yo WM who presented with acute MI 10/20/15.  Cath showed occluded RCA but also ds in LAD and Cx and high grade rt carotid stenosis.  Underwent Rt CAE 10/27/15 and then CABG x 4 on 10/28/15.  Post op course complicated by hypotension and ventricular arrythmias.  Scr 1.09 on 10/21/15 and 1.1-1.2 on day of CABG.   Initially post op Scr increased to upper 1's-2 but has increased to 3.8.  Post op course complicated by VT 3/23 and 3/24.  EF 30-35%.  UO has been excellent and mostly 3-4.5L/d.  Remains only on milrinone.  Past Medical History  Diagnosis Date  . Hypertension   . TIA (transient ischemic attack)   . Stenosis of right carotid artery   . Complete heart block (HCC) 10/20/2015    Past Surgical History  Procedure Laterality Date  . Arm surgery Right 2003  . Eye surgery    . Laceration repair    . Cardiac catheterization N/A 10/20/2015    Procedure: Left Heart Cath and Coronary Angiography;  Surgeon: Tonny Bollman, MD;  Location: [facility] INVASIVE CV LAB;  Service: Cardiovascular;  Laterality: N/A;  . Coronary artery bypass graft N/A 10/27/2015    Procedure: CORONARY ARTERY BYPASS GRAFTING (CABG) x four, using left internal mammary artery and rt leg greater saphenous vein harvested endoscopically;  Surgeon: Kerin Perna, MD;  Location: [facility] OR;  Service: Open Heart Surgery;  Laterality: N/A;  . Tee without cardioversion N/A 10/27/2015    Procedure: TRANSESOPHAGEAL ECHOCARDIOGRAM (TEE);  Surgeon: Kerin Perna, MD;  Location: [facility] OR;  Service: Open Heart Surgery;  Laterality: N/A;  . Endarterectomy Right 10/27/2015    Procedure: RIGHT ENDARTERECTOMY CAROTID with patch angioplasty using Xenosure bovine pericardium patch;  Surgeon: Fransisco Hertz, MD;  Location: [facility] OR;  Service: Vascular;  Laterality: Right;  . Cardiac catheterization N/A 11/02/2015    Procedure: Right/Left Heart Cath and Coronary/Graft Angiography;   Surgeon: Laurey Morale, MD;  Location: [facility] INVASIVE CV LAB;  Service: Cardiovascular;  Laterality: N/A;    Family History  Problem Relation Age of Onset  . Heart disease Mother   . Failure to thrive Father   . Congestive Heart Failure Father     Deceased  . Breast cancer Sister     Living  No FH renal Ds  Social History:  reports that he quit smoking about 13 years ago. He has never used smokeless tobacco. He reports that he uses illicit drugs (Marijuana). He reports that he does not drink alcohol.  Married and lives with Wife in [place]  Allergies:  Allergies  Allergen Reactions  . Losartan Shortness Of Breath  . Lisinopril Rash    Medications Prior to Admission  Medication Sig Dispense Refill  . albuterol (PROAIR HFA) 108 (90 Base) MCG/ACT inhaler Inhale 2 puffs into the lungs every 6 (six) hours as needed. Shortness of breath    . aspirin 325 MG tablet Take 325 mg by mouth daily.    Marland Kitchen lisinopril (PRINIVIL,ZESTRIL) 20 MG tablet Take 1 tablet (20 mg total) by mouth daily. 30 tablet 5  . lovastatin (MEVACOR) 20 MG tablet Take 1 tablet (20 mg total) by mouth at bedtime. 30 tablet 5  . [EXPIRED] predniSONE (DELTASONE) 10 MG tablet Take 1 tablet by mouth taper from 4 doses each day to 1 dose and stop.       Lab Results:  UA: Nl on 3/17   Recent Labs  11/07/15 0337 11/08/15 0400 11/08/15 1538 11/09/15 0345  WBC 13.5* 12.7*  --  12.3*  HGB 8.6* 8.5* 9.5* 8.9*  HCT 28.1* 27.6* 28.0* 28.0*  PLT 234 275  --  309   BMET  Recent Labs  11/07/15 0337 11/08/15 0400 11/08/15 1538 11/09/15 0345  NA 138 141 140 142  K 3.9 3.6 4.6 4.0  CL 93* 96* 94* 96*  CO2 31 31  --  31  GLUCOSE 131* 119* 167* 178*  BUN 106* 118* 121* 140*  CREATININE 3.54* 3.71* 3.80* 3.83*  CALCIUM 8.6* 8.6*  --  9.2  PHOS 6.0* 6.2*  --  6.7*   LFT  Recent Labs  11/09/15 0345  PROT 6.7  ALBUMIN 2.7*  AST 49*  ALT 52  ALKPHOS 89  BILITOT 1.1   Dg Chest Port 1 View  11/09/2015  CLINICAL DATA:   Atelectasis. EXAM: PORTABLE CHEST 1 VIEW COMPARISON:  11/08/2015 FINDINGS: Endotracheal tube in satisfactory position. Right arm PICC tip in the lower SVC unchanged. Left subclavian central venous catheter in the SVC. NG tube and feeding tube enters the stomach with the tips not visualized. Negative for pneumothorax. Left lower lobe airspace disease unchanged. Small left effusion unchanged. Mild pulmonary vascular congestion unchanged. IMPRESSION: Support lines remain in satisfactory position and unchanged Left lower lobe atelectasis/ infiltrate unchanged. Mild right lower lobe airspace consolidation stable. Mild vascular congestion unchanged. Electronically Signed   By: Marlan Palau M.D.   On: 11/09/2015 07:36   Dg Chest Port 1 View  11/08/2015  CLINICAL DATA:  Respiratory failure, hypertension, coronary artery disease post MI and CABG EXAM: PORTABLE CHEST 1 VIEW COMPARISON:  Portable exam 1216 hours compared to 11/06/2015 FINDINGS: Tip of endotracheal tube projects 6.8 cm above carina. Nasogastric tube and feeding tube extends into stomach. LEFT subclavian central venous catheter with tip projecting over SVC. RIGHT arm PICC line tip projects over SVC. Enlargement of cardiac silhouette post CABG. Stable mediastinal contours. Increased LEFT lower lobe consolidation. Slightly increased infiltrate at RIGHT base as well. Minimal central peribronchial thickening. No pleural effusion or pneumothorax. IMPRESSION: Enlargement of cardiac silhouette post CABG. Stable line and tube positions. Increased LEFT lower lobe consolidation with minimally increased infiltrate at RIGHT base as well. Findings are suspicious for pneumonia involving the LEFT lower lobe though a component of coexisting pulmonary edema may be present. Electronically Signed   By: Ulyses Southward M.D.   On: 11/08/2015 12:47    ROS: Pt intubated  PHYSICAL EXAM: Blood pressure 152/65, pulse 78, temperature 97.4 F (36.3 C), temperature source Axillary,  resp. rate 17, height 5\' 10"  (1.778 m), weight 112.4 kg (247 lb 12.8 oz), SpO2 97 %. HEENT: Intubated and ventilated NECK:Lt triple lumen LUNGS:clear ant CARDIAC:irreg, irreg no rub ABD:+ BS NTND No HSM EXT:1+ edema on RT 0-tr on Lt.   Rt PICC line NEURO:Awake and alert and follows commands.  No asterixis  Assessment: 1. ARF most likely related to ATN, nonoliguric 2. SP CABG complicated by recurrent VT 3. Hx HTN PLAN: 1. UO is excellent though Scr cont to rise.  He does not appear overtly uremic and no urgent need for dialysis.  Discussed the role of HD with pt and wife and that if Scr/Bun cont to increase he may IHD (don't think he needs CVVHD) in next 1-2 days but I remain optimistic that renal fx will recover even if needs HD. 2.  Daily Scr 3. Cont lasix  gtt   Saranne Crislip T 11/09/2015, 1:06 PM

## 2015-11-09 NOTE — Progress Notes (Signed)
7 Days Post-Op Procedure(s) (LRB): Right/Left Heart Cath and Coronary/Graft Angiography (N/A) Intra-Aortic Balloon Pump Insertion Subjective: Patient slowly improving with post MI biventricular dysfunction Balloon pump out Currently in sinus rhythm, co-OX greater than 60 Remains on ventilator FiO2 70%, chest x-ray fairly clear Postoperative acute renal failure with rising BUN creatinine but with maintaining good urine output-renal consult pending With improved RV function will wean off nitric oxide as sildenafil oral  is being titrated Diffuse rash probably ran antibiotic related-now on Levaquin and vancomycin  Objective: Vital signs in last 24 hours: Temp:  [96.8 F (36 C)-97.5 F (36.4 C)] 97.5 F (36.4 C) (04/03 0731) Pulse Rate:  [72-92] 91 (04/03 0700) Cardiac Rhythm:  [-] A-V Sequential paced (04/02 2000) Resp:  [11-23] 14 (04/03 0700) BP: (109-144)/(63-66) 109/66 mmHg (04/02 1126) SpO2:  [91 %-100 %] 100 % (04/03 0735) Arterial Line BP: (106-158)/(56-79) 137/66 mmHg (04/03 0700) FiO2 (%):  [50 %-70 %] 70 % (04/03 0735) Weight:  [247 lb 12.8 oz (112.4 kg)] 247 lb 12.8 oz (112.4 kg) (04/03 0417)  Hemodynamic parameters for last 24 hours: CVP:  [12 mmHg-28 mmHg] 18 mmHg  Intake/Output from previous day: 04/02 0701 - 04/03 0700 In: 3748.9 [P.O.:30; I.V.:1763.9; NG/GT:1905] Out: 5903 [Urine:4500; Emesis/NG output:600; Stool:803] Intake/Output this shift:   Alert on ventilator 2+ diffuse edema Incisions clean and dry Abdomen soft Feeding tube in place   Lab Results:  Recent Labs  11/08/15 0400 11/08/15 1538 11/09/15 0345  WBC 12.7*  --  12.3*  HGB 8.5* 9.5* 8.9*  HCT 27.6* 28.0* 28.0*  PLT 275  --  309   BMET:  Recent Labs  11/08/15 0400 11/08/15 1538 11/09/15 0345  NA 141 140 142  K 3.6 4.6 4.0  CL 96* 94* 96*  CO2 31  --  31  GLUCOSE 119* 167* 178*  BUN 118* 121* 140*  CREATININE 3.71* 3.80* 3.83*  CALCIUM 8.6*  --  9.2    PT/INR: No results  for input(s): LABPROT, INR in the last 72 hours. ABG    Component Value Date/Time   PHART 7.421 11/09/2015 0322   HCO3 31.3* 11/09/2015 0322   TCO2 32.8 11/09/2015 0322   ACIDBASEDEF TEST WILL BE CREDITED 11/01/2015 0446   O2SAT 89.9 11/09/2015 0322   CBG (last 3)   Recent Labs  11/08/15 2358 11/09/15 0347 11/09/15 0715  GLUCAP 148* 158* 147*    Assessment/Plan: S/P Procedure(s) (LRB): Right/Left Heart Cath and Coronary/Graft Angiography (N/A) Intra-Aortic Balloon Pump Insertion Wean nitric oxide, wean FiO2 as tolerated  Renal consult for CVVH with rising BUN and creatinine    LOS: 20 days    Christopher [name] 11/09/2015

## 2015-11-09 NOTE — Progress Notes (Signed)
Patient ID: Christopher [name], male   DOB: [birth year], 55 y.o.   MRN: [id number]   [name] is a 55 year old with a history of HTN, TIA, CHB, DMII, and marijuana use. On March 14th he presented to [facility] with increased dyspnea. On arrival he had inferior MI and CHB. Urgently transferred to [facility] for cath. LHC Ramus lesion, 70% stenosed, Prox LAD lesion, 95% stenosed, and prox RCA lesion, 100% stenosed. Developed pulmonary edema post cath and was diuresed IV lasix.   Cardiac Surgery consulted---10/27/15 S/P CABG x4 LIMA to LAD, SVG to DIAGONAL, SVG to RAMUS, INTERMEDIATE, SVG to RDA 3 and R CEA. Extubated 3/22. IABP removed 3/23 and remained on dopamine, milrinone, and epi. 3/23 had VF arrest requiring CPR shock x4. Re-intubated. CXR-infiltrate R lung base. Started on milrinone 0.2 mcg, amio drip, norepinephrine. Nitric oxide restarted at 10 ppm.  He had vfib arrest again 3/24 pm and lidocaine added.  VT 3/26-3/27 overnight again, 8 shocks, procainamide added and no further VT.  Stopped norepinephrine but milrinone up to 0.5 still with marginal co-ox.  In effort to decrease milrinone and decrease drive towards VT, he was taken back to cath lab and IABP placed again.  Milrinone decreased to 0.375 then 0.25.    No further VT.  He is now off lidocaine and procainamide.  Remains intubated but is awake. IABP removed 4/2. Milrinone 0.25. Lasix now at 20 mg/hr with metolazone 10 daily.  Good UOP yesterday with 6 lb weight drop but BUN/creatinine up again. Creatinine 3.4-> 3.5-> 3.7 -> 3.83, BUN 142.  Co-ox 67% this morning with CVP 18.    ECHO 10/02/15 EF 25-30% RV normal AK entire anteroseptal and inferoseptal myocardium.  TEE 10/27/2015: EF 35-40% RV mildly dilated TR mod regurg.  ECHO 10/30/15: EF 25-30% ECHO 11/06/15: EF 30-35%  LHC/RHC/IABP placement 3/27 Left Anterior Descending  Native left coronary was not injected to save contrast. LIMA-LAD was patent. SVG-small diagonal was patent. There was  marked size mismatch between graft and artery with resulting slow flow. There appeared to be about 80% stenosis in the diagonal distal to touchdown of graft.      Ramus Intermedius  Native LCA not injected to save contrast. SVG-ramus patent. There was a size mismatch between large vein graft and smaller caliber native artery. There appeared to be 80% stenosis in the ramus distal to the SVG touchdown and 70% stenosis proximal to SVG touchdown.     Left Circumflex  Not visualized, native LCA not injected to save contrast.     Right Coronary Artery  Native RCA known to be occluded, not injected. SVG-PDA patent.       Right Heart Pressures Procedural Findings: Hemodynamics (mmHg), on milrinone 0.5 RA mean 19 RV 44/23 PA 43/25, mean 33 PCWP mean 26 AO 124/71 Oxygen saturations: PA 64% AO 100% Cardiac Output (Fick) 6.52  Cardiac Index (Fick) 2.86 Cardiac Output (Thermo) 4.97 Cardiac Index (Thermo) 2.18    Scheduled Meds: . amiodarone  150 mg Intravenous Once  . antiseptic oral rinse  7 mL Mouth Rinse QID  . aspirin  325 mg Per Tube Daily  . atorvastatin  80 mg Oral q1800  . bisacodyl  10 mg Oral Daily   Or  . bisacodyl  10 mg Rectal Daily  . chlorhexidine gluconate (SAGE KIT)  15 mL Mouth Rinse BID  . feeding supplement (PRO-STAT SUGAR FREE 64)  30 mL Per Tube TID  . fentaNYL (SUBLIMAZE) injection  25 mcg Intravenous Once  .  hydrALAZINE  12.5 mg Oral 3 times per day  . insulin aspart  0-24 Units Subcutaneous 6 times per day  . insulin detemir  12 Units Subcutaneous BID  . isosorbide mononitrate  30 mg Oral Daily  . levalbuterol  1.25 mg Nebulization Q6H WA  . levofloxacin (LEVAQUIN) IV  750 mg Intravenous Q48H  . methylPREDNISolone (SOLU-MEDROL) injection  40 mg Intravenous Q12H  . metolazone  10 mg Oral Daily  . midazolam  1 mg Intravenous Once  . pantoprazole (PROTONIX) IV  40 mg Intravenous Daily  . potassium chloride  40 mEq Oral 3 times per day  . sennosides  5  mL Per Tube BID  . sildenafil  40 mg Oral TID  . silver sulfADIAZINE   Topical BID  . sodium chloride flush  10-40 mL Intracatheter Q12H  . triamcinolone ointment   Topical BID  . vancomycin  1,250 mg Intravenous Q48H   Continuous Infusions: . sodium chloride 10 mL/hr at 11/08/15 1928  . amiodarone 30 mg/hr (11/09/15 0550)  . feeding supplement (VITAL HIGH PROTEIN) 1,000 mL (11/09/15 0630)  . fentaNYL infusion INTRAVENOUS 300 mcg/hr (11/09/15 0243)  . furosemide (LASIX) infusion 10 mg/hr (11/09/15 0747)  . midazolam (VERSED) infusion 6 mg/hr (11/09/15 0130)  . milrinone 0.25 mcg/kg/min (11/09/15 0440)  . norepinephrine (LEVOPHED) Adult infusion Stopped (11/01/15 1930)   PRN Meds:.diphenhydrAMINE, fentaNYL, midazolam, ondansetron (ZOFRAN) IV, sodium chloride flush, traMADol    Filed Vitals:   11/09/15 0600 11/09/15 0700 11/09/15 0731 11/09/15 0735  BP:      Pulse: 91 91    Temp:   97.5 F (36.4 C)   TempSrc:   Axillary   Resp: 15 14    Height:      Weight:      SpO2: 96% 93%  100%    Intake/Output Summary (Last 24 hours) at 11/09/15 0800 Last data filed at 11/09/15 0700  Gross per 24 hour  Intake 3541.49 ml  Output   5902 ml  Net -2360.51 ml    LABS: Basic Metabolic Panel:  Recent Labs  11/08/15 0400 11/08/15 1538 11/09/15 0345  NA 141 140 142  K 3.6 4.6 4.0  CL 96* 94* 96*  CO2 31  --  31  GLUCOSE 119* 167* 178*  BUN 118* 121* 140*  CREATININE 3.71* 3.80* 3.83*  CALCIUM 8.6*  --  9.2  MG 2.7*  --  3.0*  PHOS 6.2*  --  6.7*   Liver Function Tests:  Recent Labs  11/08/15 0400 11/09/15 0345  AST 44* 49*  ALT 49 52  ALKPHOS 86 89  BILITOT 1.1 1.1  PROT 6.3* 6.7  ALBUMIN 2.5* 2.7*   No results for input(s): LIPASE, AMYLASE in the last 72 hours. CBC:  Recent Labs  11/08/15 0400 11/08/15 1538 11/09/15 0345  WBC 12.7*  --  12.3*  HGB 8.5* 9.5* 8.9*  HCT 27.6* 28.0* 28.0*  MCV 91.4  --  92.1  PLT 275  --  309   Cardiac Enzymes: No  results for input(s): CKTOTAL, CKMB, CKMBINDEX, TROPONINI in the last 72 hours. BNP: Invalid input(s): POCBNP D-Dimer: No results for input(s): DDIMER in the last 72 hours. Hemoglobin A1C: No results for input(s): HGBA1C in the last 72 hours. Fasting Lipid Panel: No results for input(s): CHOL, HDL, LDLCALC, TRIG, CHOLHDL, LDLDIRECT in the last 72 hours. Thyroid Function Tests: No results for input(s): TSH, T4TOTAL, T3FREE, THYROIDAB in the last 72 hours.  Invalid input(s): FREET3 Anemia Panel: No results  for input(s): VITAMINB12, FOLATE, FERRITIN, TIBC, IRON, RETICCTPCT in the last 72 hours.  RADIOLOGY: Dg Chest 2 View  10/23/2015  CLINICAL DATA:  CHF.  Weakness. EXAM: CHEST  2 VIEW COMPARISON:  10/21/2015 FINDINGS: Multiple interstitial densities in the lower chest are suggestive for interstitial edema, right side greater than left. Heart size is upper limits of normal. Trachea is midline. Negative for a pneumothorax. Evidence for a small right pleural effusion. IMPRESSION: Interstitial densities in the lower chest, right side greater than left. Findings are suggestive for interstitial pulmonary edema. Difficult to exclude some airspace disease in the right lower lung. Evidence for small right pleural effusion. Electronically Signed   By: Markus Daft M.D.   On: 10/23/2015 08:27   Dg Abd 1 View  11/03/2015  CLINICAL DATA:  Enteric tube advanced to post pyloric position. EXAM: ABDOMEN - 1 VIEW COMPARISON:  11/02/2015 abdominal radiographs. FINDINGS: Fluoroscopy time 7 minutes. Enteric tube courses through the stomach and duodenum and appears to terminate near the duodenal jejunal junction. Instilled contrast opacifies nondilated proximal jejunal small bowel loops. No contrast leak is demonstrated. Separate enteric tube terminates in the body of the stomach. Visualized lower sternotomy wires appear aligned and intact. IMPRESSION: Enteric tube appears to terminate near the duodenal jejunal junction.  Separate enteric tube terminates in the body of the stomach. Electronically Signed   By: Ilona Sorrel M.D.   On: 11/03/2015 17:02   Dg Chest Port 1 View  11/09/2015  CLINICAL DATA:  Atelectasis. EXAM: PORTABLE CHEST 1 VIEW COMPARISON:  11/08/2015 FINDINGS: Endotracheal tube in satisfactory position. Right arm PICC tip in the lower SVC unchanged. Left subclavian central venous catheter in the SVC. NG tube and feeding tube enters the stomach with the tips not visualized. Negative for pneumothorax. Left lower lobe airspace disease unchanged. Small left effusion unchanged. Mild pulmonary vascular congestion unchanged. IMPRESSION: Support lines remain in satisfactory position and unchanged Left lower lobe atelectasis/ infiltrate unchanged. Mild right lower lobe airspace consolidation stable. Mild vascular congestion unchanged. Electronically Signed   By: Franchot Gallo M.D.   On: 11/09/2015 07:36   Dg Chest Port 1 View  11/08/2015  CLINICAL DATA:  Respiratory failure, hypertension, coronary artery disease post MI and CABG EXAM: PORTABLE CHEST 1 VIEW COMPARISON:  Portable exam 1216 hours compared to 11/06/2015 FINDINGS: Tip of endotracheal tube projects 6.8 cm above carina. Nasogastric tube and feeding tube extends into stomach. LEFT subclavian central venous catheter with tip projecting over SVC. RIGHT arm PICC line tip projects over SVC. Enlargement of cardiac silhouette post CABG. Stable mediastinal contours. Increased LEFT lower lobe consolidation. Slightly increased infiltrate at RIGHT base as well. Minimal central peribronchial thickening. No pleural effusion or pneumothorax. IMPRESSION: Enlargement of cardiac silhouette post CABG. Stable line and tube positions. Increased LEFT lower lobe consolidation with minimally increased infiltrate at RIGHT base as well. Findings are suspicious for pneumonia involving the LEFT lower lobe though a component of coexisting pulmonary edema may be present. Electronically Signed    By: Lavonia Dana M.D.   On: 11/08/2015 12:47   Dg Chest Port 1 View  11/06/2015  CLINICAL DATA:  Acute CHF secondary to acute MI, complete heart block. EXAM: PORTABLE CHEST 1 VIEW COMPARISON:  Portable chest x-ray of November 05, 2015 FINDINGS: The lungs are adequately inflated. The pulmonary interstitial markings are less prominent on the right today. They have improved on the left as well and the hemidiaphragm is now visible. There is persistent interstitial edema on the left.  The cardiac silhouette remains enlarged. The intraaortic balloon pump marker tip projects over the proximal portion of descending thoracic aorta and appears stable. The endotracheal tube tip projects 5.5 cm above the carina. The nasogastric and Doppler AH feeding tubes have their tips below the inferior margin of the image. The right-sided PICC line tip projects over the junction of the middle and distal thirds of the SVC. The left subclavian venous catheter tip projects over the midportion of the SVC. IMPRESSION: Decreasing pulmonary edema consistent with improvement in CHF. Persistent perihilar interstitial edema on the left. The support tubes and devices are in reasonable position. Electronically Signed   By: David  Martinique M.D.   On: 11/06/2015 07:32   Dg Chest Port 1 View  11/05/2015  CLINICAL DATA:  Respiratory failure. EXAM: PORTABLE CHEST 1 VIEW COMPARISON:  11/04/2015 . FINDINGS: Endotracheal tube, NG tube, right PICC line stable position. Prior CABG. Cardiomegaly. Persistent left mid and lower lung field infiltrate. Persistent mild atelectatic changes right upper and right lower lobes. No pneumothorax. IMPRESSION: 1.  Lines and tubes in stable position. 2. Persistent left mid and left lower lung infiltrate, no interim change. Persistent right upper lobe and right lower lobe subsegmental atelectasis. No interim change. 3. Prior CABG.  Stable cardiomegaly. Electronically Signed   By: Marcello Moores  Register   On: 11/05/2015 07:48   Dg  Chest Port 1 View  11/04/2015  CLINICAL DATA:  Hypoxia EXAM: PORTABLE CHEST 1 VIEW COMPARISON:  November 04, 2015 FINDINGS: Endotracheal tube tip is 5.3 cm above the carina. Central catheter tip is in the superior vena cava near the cavoatrial junction. Nasogastric tube tip and side port are below the diaphragm. There also is a feeding tube with the tip below the diaphragm. No pneumothorax. There has been removal of a left chest tube. There is airspace consolidation throughout the left mid and lower lung zones, stable. There is mild atelectasis in the right upper lobe, stable. No new opacity is evident. There is stable cardiomegaly. The pulmonary vascularity is normal. No adenopathy is evident. IMPRESSION: Tube and catheter positions as described without pneumothorax. Airspace consolidation throughout the left mid and lower lung zones, stable. Atelectasis right upper lobe, stable. No new opacity. Stable cardiomegaly. Electronically Signed   By: Lowella Grip III M.D.   On: 11/04/2015 14:52   Dg Chest Port 1 View  11/04/2015  CLINICAL DATA:  Status post CABG 10/28/2015. EXAM: PORTABLE CHEST 1 VIEW COMPARISON:  Single view of the chest 11/03/2015 and 11/02/2015. FINDINGS: Support tubes and lines including a left chest tube are unchanged since the most recent exam. The chest is better expanded today with decreased atelectasis. Airspace opacity in the left mid and lower lung zones persists. There is likely a left pleural effusion. Marked cardiomegaly is seen with only mild vascular congestion noted. IMPRESSION: Increased pulmonary expansion with decreased scattered atelectasis. Left mid and lower lung zone airspace disease could be due to atelectasis or pneumonia. Small left pleural effusion noted. Support tubes and lines projecting good position.  No pneumothorax. Electronically Signed   By: Inge Rise M.D.   On: 11/04/2015 07:30   Dg Chest Port 1 View  11/03/2015  CLINICAL DATA:  Central line placement.  EXAM: PORTABLE CHEST 1 VIEW COMPARISON:  11/03/2015. FINDINGS: Left subclavian central line noted with tip at the cavoatrial junction. Interval removal of right chest tube. Endotracheal tube, NG tube, right PICC line, left IJ sheath left chest tube in stable position. No pneumothorax. Prior CABG. Stable cardiomegaly.  Low lung volumes. A mild component of congestive heart failure cannot be excluded. Similar findings noted on prior exam P IMPRESSION: 1. Interim placement of left subclavian central line, its tip is at the cavoatrial junction. Interim removal right chest tube . No pneumothorax. 2. Remaining lines and tubes including left chest tube in stable position. 3. Prior CABG. Stable cardiomegaly. Low lung volumes. A a mild component congestive heart failure cannot be excluded . Similar findings noted on prior exam. Electronically Signed   By: [place]   On: 11/03/2015 09:58   Dg Chest Port 1 View  11/03/2015  CLINICAL DATA:  Status post CABG on October 27, 2015 persistent left lower lobe atelectasis or pneumonia. No pneumothorax or significant pleural effusion. EXAM: PORTABLE CHEST 1 VIEW COMPARISON:  Portable chest x-ray of November 02, 2015 at 5:48 a.m. FINDINGS: There has been interval placement of an intra aortic balloon pump whose marker lies over the junction of the aortic arch with the proximal descending thoracic aorta. The cardiac silhouette remains enlarged. The pulmonary vascularity remains mildly prominent. The retrocardiac region on the left remains dense. Bilateral chest tubes are in stable position. There is no pneumothorax nor significant pleural effusion. The endotracheal tube tip lies approximately 6.5 cm above the carina. The esophagogastric tube tip projects below the inferior margin of the image. The right-sided PICC line tip projects over the midportion of the SVC. A previously demonstrated pericardial drain is not clearly evident on today's study. IMPRESSION: Mild CHF. Persistent left  lower lobe atelectasis or pneumonia. Interval placement of an intra aortic balloon pump which appears to be in appropriate position radiographically. The other support tubes and lines are in reasonable position. Electronically Signed   By: David  Martinique M.D.   On: 11/03/2015 07:27   Dg Chest Port 1 View  11/02/2015  CLINICAL DATA:  CABG. EXAM: PORTABLE CHEST 1 VIEW COMPARISON:  11/01/2015. FINDINGS: Endotracheal tube, NG tube, right PICC line, bilateral chest tubes in stable position. Prior CABG. Stable cardiomegaly . Persistent mild interstitial prominence, congestive heart failure cannot be excluded. No interim change. Low lung volumes with basilar atelectasis . IMPRESSION: 1. Endotracheal tube, NG tube, right PICC line, bilateral chest tubes in stable position . No pneumothorax. 2. Prior CABG. Cardiomegaly with pulmonary interstitial prominence and small left pleural effusion consistent with mild congestive heart failure. No interim change from prior exam . Electronically Signed   By: Marcello Moores  Register   On: 11/02/2015 07:20   Dg Chest Port 1 View  11/01/2015  CLINICAL DATA:  Post CABG EXAM: PORTABLE CHEST 1 VIEW COMPARISON:  10/31/2015 FINDINGS: Cardiomediastinal silhouette is stable. Status post CABG. Stable endotracheal and NG tube position. Left chest tube is unchanged in position. Persistent central vascular congestion and mild interstitial prominence bilaterally suspicious for the mild interstitial edema. Left IJ sheath in place. Probable small left pleural effusion left basilar atelectasis or infiltrate. Right arm PICC line is unchanged in position. There is no pneumothorax. IMPRESSION: Status post CABG. Stable support apparatus. Stable left chest tube position. No pneumothorax. Again noted bilateral mild interstitial prominence and perihilar opacities suspicious for mild pulmonary edema. Probable small left pleural effusion left basilar atelectasis or infiltrate. Electronically Signed   By: Lahoma Crocker M.D.   On: 11/01/2015 09:57   Dg Chest Port 1 View  10/31/2015  CLINICAL DATA:  55 year old male with a history of endotracheal tube placement Patient has undergone coronary artery bypass grafting x4 10/27/2015. Placement of intra aortic balloon pump at this  date. Ischemic cardiomyopathy. EXAM: PORTABLE CHEST 1 VIEW COMPARISON:  10/30/2015. FINDINGS: Endotracheal tube terminates approximately 3.4 cm above the carina, unchanged. Gastric tube terminates out of the field of view. Defibrillator pads project over the left and right chest. Bilateral thoracostomy tubes. Right upper extremity PICC. The mediastinal drain not visualized. Intra-aortic balloon pump not visualized. Surgical changes of median sternotomy and CABG. Mixed bilateral interstitial and airspace opacities. Low lung volumes. IMPRESSION: Low lung volumes with mixed bilateral interstitial and airspace opacities, similar to the comparison chest x-ray. Retrocardiac opacity may reflect persistent pleural fluid, and/or atelectasis. Surgical support apparatus appear unchanged, as above. Intra aortic balloon pump not visualized. Signed, Dulcy Fanny. Earleen Newport, DO [facility] Electronically Signed   By: Corrie Mckusick D.O.   On: 10/31/2015 07:25   Dg Chest Port 1 View  10/30/2015  CLINICAL DATA:  Post cavity.  Recent CABG. EXAM: PORTABLE CHEST 1 VIEW COMPARISON:  Portable film earlier today. FINDINGS: Overlying support apparatus redemonstrated. Tubes and lines remain stable. Elevated LEFT hemidiaphragm. No definite pneumothorax. Moderate vascular congestion may be increased. Cardiomegaly. IMPRESSION: Slight worsening aeration. Moderate vascular congestion may be increased. Electronically Signed   By: Staci Righter M.D.   On: 10/30/2015 18:39   Dg Chest Port 1 View  10/30/2015  CLINICAL DATA:  Status post coronary bypass grafting EXAM: PORTABLE CHEST 1 VIEW COMPARISON:  10/30/2015 FINDINGS: Cardiac  shadow remains enlarged. Bilateral chest tubes are again seen. A right-sided PICC line and endotracheal tube are noted in satisfactory position. Left jugular sheath is noted. Elevation of left hemidiaphragm is noted with left basilar atelectasis. No pneumothorax is noted. IMPRESSION: Postsurgical change with tubes and lines as described. Mild left basilar atelectasis. Electronically Signed   By: Inez Catalina M.D.   On: 10/30/2015 07:45   Dg Chest Port 1 View  10/30/2015  CLINICAL DATA:  A chest tube in place EXAM: PORTABLE CHEST 1 VIEW COMPARISON:  10/30/2015 FINDINGS: Postoperative changes in the mediastinum. Endotracheal tube with tip measuring 6.3 cm about the carina. Right PICC catheter with tip over the cavoatrial junction. Left central venous catheter sheath with tip over the left neck consistent location in the internal jugular vein. Bilateral chest tubes. Infiltration or atelectasis in the right lung base. Cardiac enlargement. No significant vascular congestion. No pneumothorax. IMPRESSION: Appliance positioned as described. Cardiac enlargement with infiltration or atelectasis in the right lung base. Bilateral chest tubes are present but no visualized pneumothorax. Electronically Signed   By: Lucienne Capers M.D.   On: 10/30/2015 03:07   Dg Chest Port 1 View  10/30/2015  CLINICAL DATA:  Shortness of breath.  Code blue. EXAM: PORTABLE CHEST 1 VIEW COMPARISON:  10/29/2015 FINDINGS: Postoperative changes in the mediastinum. Bilateral chest tubes are present. Right PICC line with tip over the cavoatrial junction. Left central venous catheter or sheath with tip over the left side of the neck, likely in the left internal jugular vein. Cardiac enlargement without significant vascular congestion. Shallow inspiration with elevation of the left hemidiaphragm. Probable left pleural effusion. No definite pulmonary consolidation. IMPRESSION: Shallow inspiration. Probable left pleural effusion. Cardiac enlargement.  Appliances appear in satisfactory position. Electronically Signed   By: Lucienne Capers M.D.   On: 10/30/2015 01:18   Dg Chest Port 1 View  10/29/2015  CLINICAL DATA:  Postop CABG 2 days ago. EXAM: PORTABLE CHEST 1 VIEW COMPARISON:  Portable chest x-ray of October 28, 2015 FINDINGS: The trachea and esophagus have been extubated. The cardiac silhouette  remains enlarged. The pulmonary vascularity is mildly engorged. The bilateral chest tubes and the mediastinal drain are in stable position. There is no pneumothorax or large pleural effusion. The Swan-Ganz catheter tip overlies a proximal right lower lobe pulmonary artery branch. The mediastinum is widened and accentuated by the hypo inflation. IMPRESSION: Bilateral hypoinflation following extubation of the trachea there is crowding of the pulmonary vascularity. Left basilar atelectasis or less likely pneumonia is more prominent today. Probable small left pleural effusion. The remaining support tubes and lines are in stable position. Electronically Signed   By: David  Martinique M.D.   On: 10/29/2015 07:40   Dg Chest Port 1 View  10/28/2015  CLINICAL DATA:  Post CABG, on ventilator, followup portable chest x-ray of 10/28/2015 EXAM: PORTABLE CHEST 1 VIEW COMPARISON:  None. FINDINGS: The tip of the endotracheal tube is approximately 5.2 cm above the carina. Aeration has improved slightly. Atelectasis remains at the lung bases left-greater-than-right. Swan-Ganz catheter tip is in the right lower lobe pulmonary artery and bilateral chest tubes remain. No pneumothorax is seen. Heart size is stable. IMPRESSION: 1. Improved aeration. 2. Bilateral chest tubes.  No pneumothorax. 3. Tip of endotracheal tube approximately 5.2 cm above the carina. Electronically Signed   By: Ivar Drape M.D.   On: 10/28/2015 08:01   Dg Chest Port 1 View  10/27/2015  CLINICAL DATA:  Status post CABG EXAM: PORTABLE CHEST 1 VIEW COMPARISON:  10/27/2015 FINDINGS: Unchanged tracheostomy tube.  Swan-Ganz central venous line is now seen with tip advanced into the right perihilar region. Stable right chest tube with and left chest tube. NG tube again crosses the gastroesophageal junction. Stable mediastinal drain. Enlarged cardiac silhouette. Limited inspiratory effect with bibasilar opacities likely representing atelectasis. IMPRESSION: Anticipated postoperative appearance with bibasilar atelectasis. Electronically Signed   By: Skipper Cliche M.D.   On: 10/27/2015 19:25   Dg Chest Portable 1 View  10/27/2015  CLINICAL DATA:  Status post coronary bypass grafting EXAM: PORTABLE CHEST 1 VIEW COMPARISON:  10/23/2015 FINDINGS: Cardiac shadow is mildly enlarged. A Swan-Ganz catheter is noted in the right pulmonary outflow tract. Bilateral thoracostomy catheters are seen. No pneumothorax is noted. An endotracheal tube is noted in satisfactory position. Intra-aortic balloon pump is noted over the aortic arch. Mild left basilar atelectasis is seen. Mild central vascular congestion is noted. IMPRESSION: Postoperative changes with tubes and lines as described above. Mild vascular congestion and left basilar atelectasis. Electronically Signed   By: Inez Catalina M.D.   On: 10/27/2015 17:23   Portable Chest X-ray 1 View  10/21/2015  CLINICAL DATA:  55 year old male with a history of acute systolic congestive heart failure EXAM: PORTABLE CHEST 1 VIEW COMPARISON:  10/20/2015 FINDINGS: Heart size unchanged, enlarged. Similar appearance of low lung volumes with interstitial opacities, interlobular septal thickening, and developing airspace disease of the bilateral lower lungs. No pneumothorax. Linear opacity overlying the mediastinum and the heart border, of on certain significance. Favored to overlie the patient. IMPRESSION: Evidence of worsening congestive heart failure, with increasing edema. New linear opacity overlies the mediastinum, favored to be overlying the patient, though cannot be localized on this view.  Signed, Dulcy Fanny. Earleen Newport, DO [facility] Radiology Electronically Signed   By: Corrie Mckusick D.O.   On: 10/21/2015 07:16   Dg Chest Portable 1 View  10/20/2015  CLINICAL DATA:  Shortness of breath. EXAM: PORTABLE CHEST 1 VIEW COMPARISON:  None. FINDINGS: Midline trachea. Cardiomegaly accentuated by AP portable technique. No pleural effusion  or pneumothorax. Mildly low lung volumes with bibasilar atelectasis. IMPRESSION: Mild cardiomegaly and low lung volumes.  No acute findings. Electronically Signed   By: Abigail Miyamoto M.D.   On: 10/20/2015 18:09   Dg Abd Portable 1v  11/02/2015  CLINICAL DATA:  Assess feeding tube positioning EXAM: PORTABLE ABDOMEN - 1 VIEW COMPARISON:  None in PACs FINDINGS: The repeated KUB with has less motion artifact. The tip of the feeding tube and nasogastric tube lie in the region of the distal gastric body in the pre-pyloric region. The bowel gas pattern is within the limits of normal. Numerous tubes and lines and electrodes overlie the abdomen. IMPRESSION: The tips of the feeding tube and the esophagogastric tube lie in the region of the distal gastric body in the pre-pyloric region. Electronically Signed   By: David  Martinique M.D.   On: 11/02/2015 12:16   Dg Addison Bailey G Tube Plc W/fl-no Rad  11/03/2015  CLINICAL DATA:  NASO G TUBE PLACEMENT WITH FLUORO Fluoroscopy was utilized by the requesting physician.  No radiographic interpretation.    PHYSICAL EXAM General: Awake on vent Neck: JVP hard to see.  no thyromegaly or thyroid nodule.  Lungs: Decreased breath sounds at bases bilaterally.  CV: Nondisplaced PMI.  Heart regular S1/S2, no S3/S4, no murmur.  2+ edema to knees bilaterally. Abdomen: Soft, nontender, no hepatosplenomegaly, no distention.  Neurologic: Alert and follows commands.  Psych: Normal affect. Extremities: No clubbing or cyanosis. Right groin IABP site stable. Dopplerable pedal pulses. Maculopapular rash on his  sides  TELEMETRY: Reviewed telemetry, AV pacing 90   ASSESSMENT AND PLAN:  1. CAD: S/p late presentation inferior MI and CABG x 4. Continue statin, ASA. Coronary angiography 3/27 with multiple VT episodes showed patent LIMA-LAD and SVG-RCA.  Patent SVG-ramus and SVG-D but slow flow in both grafts with diffusely diseased small target vessels.  2. Cardiogenic shock: Acute systolic CHF with RV failure post inferior MI with suspected RV involvement. EF 25-30% on echo (difficult images on 3/24 echo). He has significant RV failure from suspected RV infarct.  3/27 IABP placed to try to allow decrease in milrinone with frequent VT.  3/31 echo with EF 30-35%.  IABP out 4/2.  Good co-ox this morning at 67%. CVP 18 (lower) with weight down.  Good UOP yesterday but BUN/creatinine up. - Lasix decreased to 10 mg/hr and metolazone decreased to 5 daily.  - He remains on NO at 10 ppm but sildenafil up to 40 tid.  Would wean NO.  - Continue milrinone at 0.25 - Can add hydralazine/nitrates today, good BP.  3. Complete heart block: Pacemaker turned off today.  Underlying NSR in 70s.  No ectopy so far.  4. VF arrest 3/24 early am and again at night 3/24. VT overnight 3/26 and during the day 3/27.  K now better and on lower milrinone dose.  He is off lidocaine and procainamide.  - Continue amiodarone.  5. AKI: Creatinine and BUN up again today.  Got minimal contrast (30 cc) with cath on 3/27.  Suspect ATN. Baseline creatinine 1.0. Suspect we are going to end up needing CVVH.  Will consult renal today.  6. ID: Possible LLL PNA, elevated PCT.  He is on vancomycin/levofloxacin.   The patient is critically ill with multiple organ systems failure and requires high complexity decision making for assessment and support, frequent evaluation and titration of therapies, application of advanced monitoring technologies and extensive interpretation of multiple databases.   Critical Care Time devoted to patient  care services  described in this note is 40 Minutes.  Loralie Champagne 11/09/2015 8:00 AM

## 2015-11-09 NOTE — Progress Notes (Signed)
Patient ID: Christopher [name], male   DOB: 04-25-1961, 55 y.o.   MRN: [id number] EVENING ROUNDS NOTE :     [address]       [place] [address]             [phone]                 7 Days Post-Op Procedure(s) (LRB): Right/Left Heart Cath and Coronary/Graft Angiography (N/A) Intra-Aortic Balloon Pump Insertion  Total Length of Stay:  LOS: 20 days  BP 147/67 mmHg  Pulse 82  Temp(Src) 97.8 F (36.6 C) (Axillary)  Resp 17  Ht 5\' 10"  (1.778 m)  Wt 247 lb 12.8 oz (112.4 kg)  BMI 35.56 kg/m2  SpO2 96%  .Intake/Output      04/03 0701 - 04/04 0700   P.O.    I.V. (mL/kg) 682.8 (6.1)   Other    NG/GT 830   Total Intake(mL/kg) 1512.8 (13.5)   Urine (mL/kg/hr) 2035 (1.4)   Emesis/NG output 300 (0.2)   Stool 150 (0.1)   Total Output 2485   Net -972.2         . sodium chloride 10 mL/hr at 11/09/15 1900  . amiodarone 30 mg/hr (11/09/15 1900)  . feeding supplement (VITAL HIGH PROTEIN) 1,000 mL (11/09/15 1900)  . fentaNYL infusion INTRAVENOUS 300 mcg/hr (11/09/15 1900)  . furosemide (LASIX) infusion 10 mg/hr (11/09/15 1900)  . midazolam (VERSED) infusion 5 mg/hr (11/09/15 1900)  . milrinone 0.25 mcg/kg/min (11/09/15 1900)  . norepinephrine (LEVOPHED) Adult infusion Stopped (11/01/15 1930)     Lab Results  Component Value Date   WBC 12.3* 11/09/2015   HGB 9.9* 11/09/2015   HCT 29.0* 11/09/2015   PLT 309 11/09/2015   GLUCOSE 175* 11/09/2015   CHOL 150 10/21/2015   TRIG 171* 10/21/2015   HDL 18* 10/21/2015   LDLCALC 98 10/21/2015   ALT 52 11/09/2015   AST 49* 11/09/2015   NA 140 11/09/2015   K 4.2 11/09/2015   CL 94* 11/09/2015   CREATININE 4.10* 11/09/2015   BUN 139* 11/09/2015   CO2 31 11/09/2015   TSH 7.432* 10/30/2015   INR 1.82* 10/29/2015   HGBA1C 6.2* 10/23/2015   Alert on vent, weaning NO Tolerating IAB out   Delight Ovens MD  Beeper [phone] Office [phone] 11/09/2015 7:51 PM

## 2015-11-09 NOTE — Progress Notes (Signed)
PULMONARY / CRITICAL CARE MEDICINE   Name: Christopher [name] MRN: [id number] DOB: [birth year]    ADMISSION DATE:  10/20/2015 CONSULTATION DATE:  11/03/2015  REFERRING MD: Dr. Donata Clay / CVTS  CHIEF COMPLAINT:  Acute Hypoxic Respiratory Failure  HISTORY OF PRESENT ILLNESS:   [name] w/ ischemic cardiomyopathy following MI. Patient underwent CABG & CEA. Post-operative course complicated by arrhythmia and biventricular failure. Continues to have IABP in place. Requiring NO inhaled on ventilator. Continuing on Primacor and diuretics.   SUBJECTIVE:  fiO2 currently at 0.70, PEEP 10 IABP out Note plans to wean NO  VITAL SIGNS: BP 152/65 mmHg  Pulse 72  Temp(Src) 97.4 F (36.3 C) (Axillary)  Resp 15  Ht  (1.778 m)  Wt 112.4 kg (247 lb 12.8 oz)  BMI 35.56 kg/m2  SpO2 99%  HEMODYNAMICS: CVP:  [12 mmHg-28 mmHg] 23 mmHg  VENTILATOR SETTINGS: Vent Mode:  [-] PRVC FiO2 (%):  [50 %-70 %] 70 % Set Rate:  [14 bmp] 14 bmp Vt Set:  [650 mL] 650 mL PEEP:  [10 cmH20] 10 cmH20 Plateau Pressure:  [24 cmH20-28 cmH20] 24 cmH20  INTAKE / OUTPUT: I/O last 3 completed shifts: In: 6317.7 [P.O.:30; I.V.:2992.7; Other:50; NG/GT:2845; IV Piggyback:400] Out: 7563 [Urine:5910; Emesis/NG output:850; Stool:803]  PHYSICAL EXAMINATION: General:  Comfortable. Sedated. Wife at bedside. Neuro:  Sedated.  Pupils equal.  Easily arousable and interactive. HEENT:  ETT in place.  No scleral icterus or injection. Cardiovascular:  Regular rate & rhythm. No appreciable JVD. Lower extremity edema persists & unchanged. Lungs:  Coarse breath sounds bilaterally. Symmetric chest rise on ventilator. Abdomen:  Soft. Protuberant. Normal BS. Integument:  Warm & dry. No rash on exposed skin.  LABS:  BMET  Recent Labs Lab 11/07/15 0337 11/08/15 0400 11/08/15 1538 11/09/15 0345  NA 138 141 140 142  K 3.9 3.6 4.6 4.0  CL 93* 96* 94* 96*  CO2 31 31  --  31  BUN 106* 118* 121* 140*  CREATININE 3.54*  3.71* 3.80* 3.83*  GLUCOSE 131* 119* 167* 178*   Electrolytes  Recent Labs Lab 11/07/15 0337 11/08/15 0400 11/09/15 0345  CALCIUM 8.6* 8.6* 9.2  MG 2.7* 2.7* 3.0*  PHOS 6.0* 6.2* 6.7*   CBC  Recent Labs Lab 11/07/15 0337 11/08/15 0400 11/08/15 1538 11/09/15 0345  WBC 13.5* 12.7*  --  12.3*  HGB 8.6* 8.5* 9.5* 8.9*  HCT 28.1* 27.6* 28.0* 28.0*  PLT 234 275  --  309   Coag's No results for input(s): APTT, INR in the last 168 hours.  Sepsis Markers  Recent Labs Lab 11/03/15 1154 11/04/15 0215 11/05/15 0455  PROCALCITON 1.75 1.50 1.17   ABG  Recent Labs Lab 11/07/15 0324 11/08/15 0401 11/09/15 0322  PHART 7.434 7.454* 7.421  PCO2ART 50.6* 45.4* 49.1*  PO2ART 59.7* 93.2 62.1*    Liver Enzymes  Recent Labs Lab 11/07/15 0337 11/08/15 0400 11/09/15 0345  AST 45* 44* 49*  ALT 57 49 52  ALKPHOS 86 86 89  BILITOT 1.4* 1.1 1.1  ALBUMIN 2.7* 2.5* 2.7*    Cardiac Enzymes  Recent Labs Lab 11/02/15 1535 11/02/15 2000  TROPONINI 1.15* 1.12*    Glucose  Recent Labs Lab 11/08/15 1106 11/08/15 1512 11/08/15 2013 11/08/15 2358 11/09/15 0347 11/09/15 0715  GLUCAP 143* 154* 142* 148* 158* 147*   Imaging Dg Chest Port 1 View  11/09/2015  CLINICAL DATA:  Atelectasis. EXAM: PORTABLE CHEST 1 VIEW COMPARISON:  11/08/2015 FINDINGS: Endotracheal tube in satisfactory position. Right  arm PICC tip in the lower SVC unchanged. Left subclavian central venous catheter in the SVC. NG tube and feeding tube enters the stomach with the tips not visualized. Negative for pneumothorax. Left lower lobe airspace disease unchanged. Small left effusion unchanged. Mild pulmonary vascular congestion unchanged. IMPRESSION: Support lines remain in satisfactory position and unchanged Left lower lobe atelectasis/ infiltrate unchanged. Mild right lower lobe airspace consolidation stable. Mild vascular congestion unchanged. Electronically Signed   By: Marlan Palau M.D.   On:  11/09/2015 07:36   Dg Chest Port 1 View  11/08/2015  CLINICAL DATA:  Respiratory failure, hypertension, coronary artery disease post MI and CABG EXAM: PORTABLE CHEST 1 VIEW COMPARISON:  Portable exam 1216 hours compared to 11/06/2015 FINDINGS: Tip of endotracheal tube projects 6.8 cm above carina. Nasogastric tube and feeding tube extends into stomach. LEFT subclavian central venous catheter with tip projecting over SVC. RIGHT arm PICC line tip projects over SVC. Enlargement of cardiac silhouette post CABG. Stable mediastinal contours. Increased LEFT lower lobe consolidation. Slightly increased infiltrate at RIGHT base as well. Minimal central peribronchial thickening. No pleural effusion or pneumothorax. IMPRESSION: Enlargement of cardiac silhouette post CABG. Stable line and tube positions. Increased LEFT lower lobe consolidation with minimally increased infiltrate at RIGHT base as well. Findings are suspicious for pneumonia involving the LEFT lower lobe though a component of coexisting pulmonary edema may be present. Electronically Signed   By: Ulyses Southward M.D.   On: 11/08/2015 12:47    STUDIES:  Port CXR 3/28:  Previously reviewed by me. R PICC in good psition. ETT >5cm above carina. [place] CVL. L lung opacity unchanged. Low lung volumes. L chest tube in place. Port CXR 3/30:  Personally reviewed by me.  MICROBIOLOGY: Tracheal Asp Ctx 3/27:  Oral Flora Urine Ctx 3/24:  Negative MRSA PCR 3/20:  Negative MRSA PCR 3/14:  Negative  ANTIBIOTICS: Zosyn 3/29>>> 4/1 Vancomcyin 3/21 - 3/22; 3/29>>> Cefuroxime 3/21 - 3/23 (periop prophylaxis) Elita Quick 3/27 - 3/29 levaquin 4/1 >>   SIGNIFICANT EVENTS: 3/14 - Admit 3/14 - LHC 3/21 - CABG 3/27 - IABP replaced >> 4/1 3/28 - Chest Tubes removed. RHC & LHC.  LINES/TUBES: OETT 7.5 Extubated 3/22; 3.24>>> L Chest Tube>>> L Cable CVL 3/28>>> L IJ CVL 3/21>>> R PICC 3/23>>> IABP 3/27>>> 4/1 R Femoral Art Sheath 3/27>>> L Radial Art Line  3/21>>>  ASSESSMENT / PLAN:  PULMONARY A: Acute Hypoxic Respiratory Failure Chronic Hypercarbic Respiratory Failure - OSA/OHS vs COPD. L Chest Tube  P:   Full Vent Support, weaning PEEP and Fio2 as able Continue Xopenex q6hr Holding SBT Inh NO, note plan to wean given stabilization r heart fxn Needs volume negative prior to consideration of extubation.  CARDIOVASCULAR A:  S/P CABG & CEA Left Ventricular Heart Failure post MI Cardiogenic Shock Periop Complete Heart Block & Post-op V Tach H/O HTN  P:  CVTS & Cardiology Managing ASA VT Amiodarone gtt Primacor gtt. Lasix gtt Heparin gtt Levophed gtt to maintain MAP Transitioning from Inh NO to sildenifil   RENAL A:   Acute Renal Failure, non-oliguric  P:   Lasix gtt  Trending UOP with Foley Electrolytes & Renal Function daily Replacing electrolytes as indicated  GASTROINTESTINAL A:   Transaminitis - Likely shock liver/hepatic congestion. Constipation  P:   Trending LFTs daily Continuing TFs Protonix IV daily Dulcolax PR prn Senna bid  HEMATOLOGIC A:   Anemia - Hgb worsening. No signs of active bleeding.  P:  Transfusion PRBC ordered Heparin  gtt Trending cell counts daily w/ CBC  INFECTIOUS A:   Possible HCAP - Ctx w/ Oral Flora.  P:   Abx as above; day 6 empiric abx for most recent suspected HCAP Plan to re-culture for fever  ENDOCRINE A:   Hyperglycemia - BG controlled.  P:   Continue Levemir bid Accu-checks q4hr SSI per algorithm  NEUROLOGIC A:   Sedation on Ventilator Post-op Pain Control   P:   RASS goal: -1 to -2 Versed gtt & IV prn Fentanyl gtt & IV prn  FAMILY UPDATES: Significant other updated at bedside 4/02.   Independent CC time 35 minutes   Levy Pupa, MD, PhD 11/09/2015, 12:05 PM [facility] [phone] or if no answer [phone]

## 2015-11-09 NOTE — Plan of Care (Signed)
Problem: Fluid Volume: Goal: Ability to maintain a balanced intake and output will improve Outcome: Not Progressing CVP remains high, pt has positive fluid balance, MDs actively working on this   Problem: Bowel/Gastric: Goal: Gastrointestinal status for postoperative course will improve Outcome: Progressing Pt is finally having BM, but now diarrhea  Problem: Cardiac: Goal: Hemodynamic stability will improve Outcome: Progressing Pt is having less ectopy, BP stable Goal: Ability to maintain an adequate cardiac output will improve Outcome: Progressing Progressing, remains on lower dose of Milrinone  Problem: Nutritional: Goal: Risk for body nutrition deficit will decrease Outcome: Progressing Nutrition at goal  Problem: Respiratory: Goal: Levels of oxygenation will improve Outcome: Not Progressing Pt remains fluid overloaded and needing vent support at this time  Problem: Urinary Elimination: Goal: Ability to achieve and maintain adequate renal perfusion and functioning will improve Outcome: Not Progressing Creatinine rising, on diuretics

## 2015-11-10 ENCOUNTER — Inpatient Hospital Stay (HOSPITAL_COMMUNITY): Payer: BLUE CROSS/BLUE SHIELD

## 2015-11-10 LAB — CBC
HCT: 24.5 % — ABNORMAL LOW (ref 39.0–52.0)
Hemoglobin: 7.6 g/dL — ABNORMAL LOW (ref 13.0–17.0)
MCH: 28.9 pg (ref 26.0–34.0)
MCHC: 31 g/dL (ref 30.0–36.0)
MCV: 93.2 fL (ref 78.0–100.0)
Platelets: 313 10*3/uL (ref 150–400)
RBC: 2.63 MIL/uL — ABNORMAL LOW (ref 4.22–5.81)
RDW: 17.2 % — ABNORMAL HIGH (ref 11.5–15.5)
WBC: 12 10*3/uL — ABNORMAL HIGH (ref 4.0–10.5)

## 2015-11-10 LAB — PREPARE RBC (CROSSMATCH)

## 2015-11-10 LAB — GLUCOSE, CAPILLARY
GLUCOSE-CAPILLARY: 145 mg/dL — AB (ref 65–99)
Glucose-Capillary: 103 mg/dL — ABNORMAL HIGH (ref 65–99)
Glucose-Capillary: 121 mg/dL — ABNORMAL HIGH (ref 65–99)
Glucose-Capillary: 129 mg/dL — ABNORMAL HIGH (ref 65–99)
Glucose-Capillary: 150 mg/dL — ABNORMAL HIGH (ref 65–99)
Glucose-Capillary: 151 mg/dL — ABNORMAL HIGH (ref 65–99)
Glucose-Capillary: 164 mg/dL — ABNORMAL HIGH (ref 65–99)

## 2015-11-10 LAB — COMPREHENSIVE METABOLIC PANEL
ALT: 52 U/L (ref 17–63)
AST: 47 U/L — ABNORMAL HIGH (ref 15–41)
Albumin: 2.5 g/dL — ABNORMAL LOW (ref 3.5–5.0)
Alkaline Phosphatase: 75 U/L (ref 38–126)
Anion gap: 16 — ABNORMAL HIGH (ref 5–15)
BUN: 156 mg/dL — ABNORMAL HIGH (ref 6–20)
CO2: 31 mmol/L (ref 22–32)
Calcium: 8.7 mg/dL — ABNORMAL LOW (ref 8.9–10.3)
Chloride: 94 mmol/L — ABNORMAL LOW (ref 101–111)
Creatinine, Ser: 3.6 mg/dL — ABNORMAL HIGH (ref 0.61–1.24)
GFR calc Af Amer: 21 mL/min — ABNORMAL LOW (ref >60)
GFR calc non Af Amer: 18 mL/min — ABNORMAL LOW (ref >60)
Glucose, Bld: 140 mg/dL — ABNORMAL HIGH (ref 65–99)
Potassium: 3.6 mmol/L (ref 3.5–5.1)
Sodium: 141 mmol/L (ref 135–145)
Total Bilirubin: 0.6 mg/dL (ref 0.3–1.2)
Total Protein: 6.2 g/dL — ABNORMAL LOW (ref 6.5–8.1)

## 2015-11-10 LAB — PHOSPHORUS: PHOSPHORUS: 5.9 mg/dL — AB (ref 2.5–4.6)

## 2015-11-10 LAB — POCT I-STAT 3, ART BLOOD GAS (G3+)
Acid-Base Excess: 9 mmol/L — ABNORMAL HIGH (ref 0.0–2.0)
BICARBONATE: 33.6 meq/L — AB (ref 20.0–24.0)
O2 Saturation: 94 %
PCO2 ART: 49 mmHg — AB (ref 35.0–45.0)
PH ART: 7.444 (ref 7.350–7.450)
TCO2: 35 mmol/L (ref 0–100)
pO2, Arterial: 71 mmHg — ABNORMAL LOW (ref 80.0–100.0)

## 2015-11-10 LAB — HEPATIC FUNCTION PANEL
ALK PHOS: 79 U/L (ref 38–126)
ALT: 56 U/L (ref 17–63)
AST: 50 U/L — ABNORMAL HIGH (ref 15–41)
Albumin: 2.8 g/dL — ABNORMAL LOW (ref 3.5–5.0)
BILIRUBIN DIRECT: 0.3 mg/dL (ref 0.1–0.5)
BILIRUBIN INDIRECT: 0.7 mg/dL (ref 0.3–0.9)
BILIRUBIN TOTAL: 1 mg/dL (ref 0.3–1.2)
TOTAL PROTEIN: 6.8 g/dL (ref 6.5–8.1)

## 2015-11-10 LAB — POCT I-STAT, CHEM 8
BUN: >140 mg/dL — ABNORMAL HIGH (ref 6–20)
CALCIUM ION: 1.15 mmol/L (ref 1.12–1.23)
CHLORIDE: 96 mmol/L — AB (ref 101–111)
CREATININE: 3.6 mg/dL — AB (ref 0.61–1.24)
Glucose, Bld: 143 mg/dL — ABNORMAL HIGH (ref 65–99)
HCT: 34 % — ABNORMAL LOW (ref 39.0–52.0)
Hemoglobin: 11.6 g/dL — ABNORMAL LOW (ref 13.0–17.0)
Potassium: 3.7 mmol/L (ref 3.5–5.1)
SODIUM: 144 mmol/L (ref 135–145)
TCO2: 33 mmol/L (ref 0–100)

## 2015-11-10 LAB — CARBOXYHEMOGLOBIN
Carboxyhemoglobin: 1.2 % (ref 0.5–1.5)
Methemoglobin: 0.9 % (ref 0.0–1.5)
O2 Saturation: 66 %
Total hemoglobin: 8.6 g/dL — ABNORMAL LOW (ref 13.5–18.0)

## 2015-11-10 LAB — MAGNESIUM: Magnesium: 2.8 mg/dL — ABNORMAL HIGH (ref 1.7–2.4)

## 2015-11-10 MED ORDER — QUETIAPINE FUMARATE 25 MG PO TABS
25.0000 mg | ORAL_TABLET | Freq: Once | ORAL | Status: AC
  Administered 2015-11-10: 25 mg via ORAL
  Filled 2015-11-10: qty 1

## 2015-11-10 MED ORDER — HYDRALAZINE HCL 25 MG PO TABS
25.0000 mg | ORAL_TABLET | Freq: Three times a day (TID) | ORAL | Status: DC
  Administered 2015-11-10 – 2015-11-11 (×3): 25 mg via ORAL
  Filled 2015-11-10 (×3): qty 1

## 2015-11-10 MED ORDER — QUETIAPINE FUMARATE 50 MG PO TABS
50.0000 mg | ORAL_TABLET | Freq: Every day | ORAL | Status: DC
  Administered 2015-11-10 – 2015-11-23 (×13): 50 mg via ORAL
  Filled 2015-11-10 (×4): qty 2
  Filled 2015-11-10: qty 1
  Filled 2015-11-10 (×8): qty 2

## 2015-11-10 NOTE — Progress Notes (Signed)
Weaning parameters obtained NIF -40 cmh20 and VC 1.2 L/min with good effort

## 2015-11-10 NOTE — Progress Notes (Signed)
S: Nods yes to feeling OK O:BP 140/78 mmHg  Pulse 84  Temp(Src) 97.8 F (36.6 C) (Axillary)  Resp 17  Ht 5' 10"  (1.778 m)  Wt 114.8 kg (253 lb 1.4 oz)  BMI 36.31 kg/m2  SpO2 96%  Intake/Output Summary (Last 24 hours) at 11/10/15 0718 Last data filed at 11/10/15 0600  Gross per 24 hour  Intake 3008.2 ml  Output   5085 ml  Net -2076.8 ml   Weight change: 2.4 kg (5 lb 4.7 oz) TKW:IOXBD and alert, intubated ZHG:DJMEQ,ASTMH Resp: Clear ant Abd:+ BS NTND Ext:Tr-1+ edema on Rt. Rt PICC NEURO:CNI  Follows commands Skin: Rash under both arms extending to back Lt IJ triple lumen   . amiodarone  150 mg Intravenous Once  . antiseptic oral rinse  7 mL Mouth Rinse QID  . aspirin  325 mg Per Tube Daily  . atorvastatin  80 mg Oral q1800  . bisacodyl  10 mg Oral Daily   Or  . bisacodyl  10 mg Rectal Daily  . chlorhexidine gluconate (SAGE KIT)  15 mL Mouth Rinse BID  . feeding supplement (PRO-STAT SUGAR FREE 64)  30 mL Per Tube TID  . fentaNYL (SUBLIMAZE) injection  25 mcg Intravenous Once  . hydrALAZINE  12.5 mg Oral 3 times per day  . insulin aspart  0-24 Units Subcutaneous 6 times per day  . insulin detemir  12 Units Subcutaneous BID  . isosorbide mononitrate  30 mg Oral Daily  . levalbuterol  1.25 mg Nebulization Q6H WA  . levofloxacin (LEVAQUIN) IV  750 mg Intravenous Q48H  . metolazone  5 mg Oral Daily  . midazolam  1 mg Intravenous Once  . pantoprazole (PROTONIX) IV  40 mg Intravenous Daily  . potassium chloride  40 mEq Oral 3 times per day  . sennosides  5 mL Per Tube BID  . sildenafil  40 mg Oral TID  . silver sulfADIAZINE   Topical BID  . sodium chloride flush  10-40 mL Intracatheter Q12H  . triamcinolone cream   Topical BID  . vancomycin  1,250 mg Intravenous Q48H   Dg Chest Port 1 View  11/09/2015  CLINICAL DATA:  Atelectasis. EXAM: PORTABLE CHEST 1 VIEW COMPARISON:  11/08/2015 FINDINGS: Endotracheal tube in satisfactory position. Right arm PICC tip in the lower SVC  unchanged. Left subclavian central venous catheter in the SVC. NG tube and feeding tube enters the stomach with the tips not visualized. Negative for pneumothorax. Left lower lobe airspace disease unchanged. Small left effusion unchanged. Mild pulmonary vascular congestion unchanged. IMPRESSION: Support lines remain in satisfactory position and unchanged Left lower lobe atelectasis/ infiltrate unchanged. Mild right lower lobe airspace consolidation stable. Mild vascular congestion unchanged. Electronically Signed   By: Franchot Gallo M.D.   On: 11/09/2015 07:36   Dg Chest Port 1 View  11/08/2015  CLINICAL DATA:  Respiratory failure, hypertension, coronary artery disease post MI and CABG EXAM: PORTABLE CHEST 1 VIEW COMPARISON:  Portable exam 1216 hours compared to 11/06/2015 FINDINGS: Tip of endotracheal tube projects 6.8 cm above carina. Nasogastric tube and feeding tube extends into stomach. LEFT subclavian central venous catheter with tip projecting over SVC. RIGHT arm PICC line tip projects over SVC. Enlargement of cardiac silhouette post CABG. Stable mediastinal contours. Increased LEFT lower lobe consolidation. Slightly increased infiltrate at RIGHT base as well. Minimal central peribronchial thickening. No pleural effusion or pneumothorax. IMPRESSION: Enlargement of cardiac silhouette post CABG. Stable line and tube positions. Increased LEFT lower lobe consolidation with  minimally increased infiltrate at RIGHT base as well. Findings are suspicious for pneumonia involving the LEFT lower lobe though a component of coexisting pulmonary edema may be present. Electronically Signed   By: Lavonia Dana M.D.   On: 11/08/2015 12:47   BMET    Component Value Date/Time   NA 141 11/10/2015 0330   K 3.6 11/10/2015 0330   CL 94* 11/10/2015 0330   CO2 31 11/10/2015 0330   GLUCOSE 140* 11/10/2015 0330   BUN 156* 11/10/2015 0330   CREATININE 3.60* 11/10/2015 0330   CALCIUM 8.7* 11/10/2015 0330   GFRNONAA 18*  11/10/2015 0330   GFRAA 21* 11/10/2015 0330   CBC    Component Value Date/Time   WBC 12.0* 11/10/2015 0330   RBC 2.63* 11/10/2015 0330   HGB 7.6* 11/10/2015 0330   HCT 24.5* 11/10/2015 0330   PLT 313 11/10/2015 0330   MCV 93.2 11/10/2015 0330   MCH 28.9 11/10/2015 0330   MCHC 31.0 11/10/2015 0330   RDW 17.2* 11/10/2015 0330     Assessment:  1. ARF most likely due to ATN, Scr trending down but BUN higher due to steroids 2. SP CABG complicated by VT 3. Hx HTN 4. ? Allergic rxn  Plan: 1.  I would limit steroids as it will just increase BUN more 2. Anticipate renal fx to cont to improve provided no further insults 3. Daily labs   Henli Hey T

## 2015-11-10 NOTE — Progress Notes (Signed)
Pharmacy Antibiotic Note  Christopher [name] is a 55 y.o. male with pneumonia.  Pharmacy has been consulted for vancomycin dosing.  Trough tonight within goal range, but renal function changing, hard to assess.  Plan: Continue Vanc 1250mg  IV q48h Check vancomycin random level with AM labs to assess dosing.   Height: 5\' 10"  (177.8 cm) Weight: 253 lb 1.4 oz (114.8 kg) IBW/kg (Calculated) : 73  Temp (24hrs), Avg:98.2 F (36.8 C), Min:97.8 F (36.6 C), Max:99.1 F (37.3 C)   Recent Labs Lab 11/06/15 0432  11/07/15 0337 11/07/15 2130 11/08/15 0400 11/08/15 1538 11/09/15 0345 11/09/15 1519 11/10/15 0330  WBC 12.4*  --  13.5*  --  12.7*  --  12.3*  --  12.0*  CREATININE 3.47*  < > 3.54*  --  3.71* 3.80* 3.83* 4.10* 3.60*  VANCOTROUGH  --   --   --  20  --   --   --   --   --   < > = values in this interval not displayed.  Estimated Creatinine Clearance: 29.8 mL/min (by C-G formula based on Cr of 3.6).    Allergies  Allergen Reactions  . Losartan Shortness Of Breath  . Lisinopril Rash    Antimicrobials this admission: 3/21 Cefuroxime  >> 3/23 3/27 Ceftazidime >> 3/29 3/29 Vancomycin >> 3/29 Zosyn  >> 4/1  4/1 Levaquin  >>  Microbiology results: 3/24 urine>> neg 3/27 resp>> normal flora  Tad Moore, BCPS  Clinical Pharmacist Pager [phone]  11/10/2015 3:16 PM

## 2015-11-10 NOTE — Progress Notes (Signed)
PULMONARY / CRITICAL CARE MEDICINE   Name: Christopher [name] MRN: [id number] DOB: 1961/04/09    ADMISSION DATE:  10/20/2015 CONSULTATION DATE:  11/03/2015  REFERRING MD: Dr. Donata Clay / CVTS  CHIEF COMPLAINT:  Acute Hypoxic Respiratory Failure  HISTORY OF PRESENT ILLNESS:   [name] w/ ischemic cardiomyopathy following MI. Patient underwent CABG & CEA. Post-operative course complicated by arrhythmia and biventricular failure. Continues to have IABP in place. Requiring NO inhaled on ventilator. Continuing on Primacor and diuretics.   SUBJECTIVE:  fiO2 currently at 0.0, PEEP 5 IABP out  NO off  VITAL SIGNS: BP 121/74 mmHg  Pulse 78  Temp(Src) 98 F (36.7 C) (Oral)  Resp 18  Ht 5\' 10"  (1.778 m)  Wt 253 lb 1.4 oz (114.8 kg)  BMI 36.31 kg/m2  SpO2 95%  HEMODYNAMICS: CVP:  [16 mmHg-25 mmHg] 21 mmHg  VENTILATOR SETTINGS: Vent Mode:  [-] PRVC FiO2 (%):  [50 %-70 %] 50 % Set Rate:  [14 bmp] 14 bmp Vt Set:  [650 mL] 650 mL PEEP:  [5 cmH20-10 cmH20] 5 cmH20 Pressure Support:  [0 cmH20] 0 cmH20 Plateau Pressure:  [24 cmH20-25 cmH20] 25 cmH20  INTAKE / OUTPUT: I/O last 3 completed shifts: In: 4588.6 [I.V.:2058.6; Other:50; NG/GT:2080; IV Piggyback:400] Out: 8175 Statilius.Lah; Emesis/NG output:800; Stool:750]  PHYSICAL EXAMINATION: General:  Comfortable. Sedated. Wife at bedside. Neuro:  Sedated.  Pupils equal.  Easily arousable and interactive. HEENT:  ETT in place.  No scleral icterus or injection. Cardiovascular:  Regular rate & rhythm. No appreciable JVD. Lower extremity edema persists & unchanged. Lungs:  Coarse breath sounds bilaterally. Symmetric chest rise on ventilator. Abdomen:  Soft. Protuberant. Normal BS. Integument:  Warm & dry. No rash on exposed skin.  LABS:  BMET  Recent Labs Lab 11/08/15 0400  11/09/15 0345 11/09/15 1519 11/10/15 0330  NA 141  < > 142 140 141  K 3.6  < > 4.0 4.2 3.6  CL 96*  < > 96* 94* 94*  CO2 31  --  31  --  31  BUN 118*   < > 140* 139* 156*  CREATININE 3.71*  < > 3.83* 4.10* 3.60*  GLUCOSE 119*  < > 178* 175* 140*  < > = values in this interval not displayed. Electrolytes  Recent Labs Lab 11/08/15 0400 11/09/15 0345 11/10/15 0330  CALCIUM 8.6* 9.2 8.7*  MG 2.7* 3.0* 2.8*  PHOS 6.2* 6.7* 5.9*   CBC  Recent Labs Lab 11/08/15 0400  11/09/15 0345 11/09/15 1519 11/10/15 0330  WBC 12.7*  --  12.3*  --  12.0*  HGB 8.5*  < > 8.9* 9.9* 7.6*  HCT 27.6*  < > 28.0* 29.0* 24.5*  PLT 275  --  309  --  313  < > = values in this interval not displayed. Coag's No results for input(s): APTT, INR in the last 168 hours.  Sepsis Markers  Recent Labs Lab 11/03/15 1154 11/04/15 0215 11/05/15 0455  PROCALCITON 1.75 1.50 1.17   ABG  Recent Labs Lab 11/08/15 0401 11/09/15 0322 11/10/15 0332  PHART 7.454* 7.421 7.444  PCO2ART 45.4* 49.1* 49.0*  PO2ART 93.2 62.1* 71.0*    Liver Enzymes  Recent Labs Lab 11/08/15 0400 11/09/15 0345 11/10/15 0330  AST 44* 49* 47*  ALT 49 52 52  ALKPHOS 86 89 75  BILITOT 1.1 1.1 0.6  ALBUMIN 2.5* 2.7* 2.5*    Cardiac Enzymes No results for input(s): TROPONINI, PROBNP in the last 168 hours.  Glucose  Recent Labs Lab 11/09/15 1122 11/09/15 1518 11/09/15 2035 11/10/15 0054 11/10/15 0335 11/10/15 0713  GLUCAP 177* 157* 148* 145* 103* 150*   Imaging Dg Chest Port 1 View  11/10/2015  CLINICAL DATA:  Shortness of breath . EXAM: PORTABLE CHEST 1 VIEW COMPARISON:  11/09/2015. FINDINGS: Endotracheal tube, NG tube, right PICC line, left subclavian line in stable position. Prior CABG. Cardiomegaly. Left lower lobe atelectasis and/or infiltrate with small left pleural effusion. Mild right base atelectasis and or infiltrate. No pneumothorax . IMPRESSION: 1.  Lines and tubes in stable position. 2. Left lower lobe atelectasis and/or infiltrate with small left pleural effusion. Mild right base atelectasis and/or infiltrate. Findings stable from prior exam. 3.  Prior  CABG.  Stable cardiomegaly. Electronically Signed   By: Maisie Fus  Register   On: 11/10/2015 07:51    STUDIES:  Port CXR 3/28:  Previously reviewed by me. R PICC in good psition. ETT >5cm above carina. New L [place] CVL. L lung opacity unchanged. Low lung volumes. L chest tube in place. Port CXR 3/30:  Personally reviewed by me.  MICROBIOLOGY: Tracheal Asp Ctx 3/27:  Oral Flora Urine Ctx 3/24:  Negative MRSA PCR 3/20:  Negative MRSA PCR 3/14:  Negative  ANTIBIOTICS: Zosyn 3/29>>> 4/1 Vancomcyin 3/21 - 3/22; 3/29>>> Cefuroxime 3/21 - 3/23 (periop prophylaxis) Elita Quick 3/27 - 3/29 levaquin 4/1 >>   SIGNIFICANT EVENTS: 3/14 - Admit 3/14 - LHC 3/21 - CABG 3/27 - IABP replaced >> 4/1 3/28 - Chest Tubes removed. RHC & LHC.  LINES/TUBES: OETT 7.5 Extubated 3/22; 3.24>>> L Chest Tube>>> L [place] CVL 3/28>>> L IJ CVL 3/21>>> R PICC 3/23>>> IABP 3/27>>> 4/1 R Femoral Art Sheath 3/27>>> L Radial Art Line 3/21>>>  ASSESSMENT / PLAN:  PULMONARY A: Acute Hypoxic Respiratory Failure Chronic Hypercarbic Respiratory Failure - OSA/OHS vs COPD.   P:   Full Vent Support, weaning PEEP and Fio2 as able Continue Xopenex q6hr Holding SBT Inh NO, now off 4/4 Needs volume negative prior to consideration of extubation. Suspect he will need trach  CARDIOVASCULAR A:  S/P CABG & CEA Left Ventricular Heart Failure post MI Cardiogenic Shock Periop Complete Heart Block & Post-op V Tach H/O HTN  P:  CVTS & Cardiology Managing ASA VT Amiodarone gtt Primacor gtt. Lasix gtt Heparin gtt Levophed gtt to maintain MAP, currently off Transitioning from Inh NO to sildenifil , now off NO  RENAL Lab Results  Component Value Date   CREATININE 3.60* 11/10/2015   CREATININE 4.10* 11/09/2015   CREATININE 3.83* 11/09/2015    Recent Labs Lab 11/09/15 0345 11/09/15 1519 11/10/15 0330  K 4.0 4.2 3.6      A:   Acute Renal Failure, non-oliguric  P:   Lasix gtt at 10 Trending UOP with  Foley Electrolytes & Renal Function daily Replacing electrolytes as indicated  GASTROINTESTINAL A:   Transaminitis - Likely shock liver/hepatic congestion. Constipation  P:    Continuing TFs Protonix IV daily Dulcolax PR prn Senna bid  HEMATOLOGIC  Recent Labs  11/09/15 1519 11/10/15 0330  HGB 9.9* 7.6*     A:   Anemia - Hgb worsening. No signs of active bleeding.  P:  Transfusion PRBC as needed Heparin gtt Trending cell counts daily w/ CBC  INFECTIOUS A:   Possible HCAP - Ctx w/ Oral Flora.  P:   Abx as above; day 7 empiric abx for most recent suspected HCAP Plan to re-culture for fever  ENDOCRINE CBG (last 3)   Recent Labs  11/10/15  1884 11/10/15 0335 11/10/15 0713  GLUCAP 145* 103* 150*     A:   Hyperglycemia - BG controlled.  P:   Continue Levemir bid Accu-checks q4hr SSI per algorithm  NEUROLOGIC A:   Sedation on Ventilator Post-op Pain Control   P:   RASS goal: -1 to -2 Versed gtt & IV prn Fentanyl gtt & IV prn  FAMILY UPDATES: Significant other updated at bedside 4/04. Topic of tracheostomy broached. Family expressed limited understanding but was OK with tracheostomy.   CCT:  35 minutes  Brett Canales Minor ACNP [facility] P[facility] Pager [phone] till 3 pm If no answer page [phone] 11/10/2015, 10:07 AM   Attending Note:  I have examined patient, reviewed labs, studies and notes. I have discussed the case with S Minor, and I agree with the data and plans as amended above. VDRF due to ischemic cardiomyopathy, cardiogenic shock. He is off NO now. On eval he interacts, is tolerating MV but on 0.60 + 5. We will attempt to wean FiO2 as ableI. Consider extubation in coming days if continued progress. Have approached idea of trach with him and family if he is slower to progress. Independent critical care time is 35 minutes.   Levy Pupa, MD, PhD 11/10/2015, 10:48 AM [facility] [phone] or if no answer [phone]

## 2015-11-10 NOTE — Progress Notes (Signed)
[facility] BRIEF SICU PROGRESS NOTE  8 Days Post-Op  S/P Procedure(s) (LRB): Right/Left Heart Cath and Coronary/Graft Angiography (N/A) Intra-Aortic Balloon Pump Insertion   Stable Afib w/ stable BP Did not do well w/ spontaneous breathing trials Diuresing very well  Plan: Continue current plan  Purcell Nails, MD 11/10/2015 8:48 PM

## 2015-11-10 NOTE — Progress Notes (Signed)
8 Days Post-Op Procedure(s) (LRB): Right/Left Heart Cath and Coronary/Graft Angiography (N/A) Intra-Aortic Balloon Pump Insertion Subjective: Stable hemodynamics despite converting to atrial fibrillation Acute renal failure with elevated BUN creatinine but persistent adequate urine output Remains ventilator dependent but nitric oxide has been weaned off, FiO2 has been weaned and pressure support weaning trials have initiated. I agree with Dr. Delton Coombes that tracheostomy [percutaneous bedside] would be indicated if he is not extubated by the end of the week  Objective: Vital signs in last 24 hours: Temp:  [97.8 F (36.6 C)-99.1 F (37.3 C)] 98.2 F (36.8 C) (04/04 1511) Pulse Rate:  [69-86] 77 (04/04 1600) Cardiac Rhythm:  [-] Atrial fibrillation (04/04 0728) Resp:  [0-23] 16 (04/04 1600) BP: (108-160)/(61-85) 111/64 mmHg (04/04 1600) SpO2:  [92 %-99 %] 92 % (04/04 1600) Arterial Line BP: (133-182)/(59-80) 141/60 mmHg (04/04 1600) FiO2 (%):  [40 %-70 %] 40 % (04/04 1543) Weight:  [253 lb 1.4 oz (114.8 kg)] 253 lb 1.4 oz (114.8 kg) (04/04 0600)  Hemodynamic parameters for last 24 hours: CVP:  [16 mmHg-22 mmHg] 22 mmHg  Intake/Output from previous day: 04/03 0701 - 04/04 0700 In: 3008.2 [I.V.:1318.2; NG/GT:1290; IV Piggyback:400] Out: 5085 [Urine:4135; Emesis/NG output:600; Stool:350] Intake/Output this shift: Total I/O In: 1897.6 [I.V.:507.6; Blood:700; NG/GT:690] Out: 1990 [Urine:1890; Stool:100]       Exam    General- alert and comfortable   Lungs- clear without rales, wheezes   Cor- regular rate and rhythm, no murmur , gallop   Abdomen- soft, non-tender   Extremities - warm, non-tender, minimal edema   Neuro- oriented, appropriate, no focal weakness   Lab Results:  Recent Labs  11/09/15 0345  11/10/15 0330 11/10/15 1519  WBC 12.3*  --  12.0*  --   HGB 8.9*  < > 7.6* 11.6*  HCT 28.0*  < > 24.5* 34.0*  PLT 309  --  313  --   < > = values in this interval not  displayed. BMET:  Recent Labs  11/09/15 0345  11/10/15 0330 11/10/15 1519  NA 142  < > 141 144  K 4.0  < > 3.6 3.7  CL 96*  < > 94* 96*  CO2 31  --  31  --   GLUCOSE 178*  < > 140* 143*  BUN 140*  < > 156* >140*  CREATININE 3.83*  < > 3.60* 3.60*  CALCIUM 9.2  --  8.7*  --   < > = values in this interval not displayed.  PT/INR: No results for input(s): LABPROT, INR in the last 72 hours. ABG    Component Value Date/Time   PHART 7.444 11/10/2015 0332   HCO3 33.6* 11/10/2015 0332   TCO2 33 11/10/2015 1519   ACIDBASEDEF TEST WILL BE CREDITED 11/01/2015 0446   O2SAT 66.0 11/10/2015 0340   CBG (last 3)   Recent Labs  11/10/15 0335 11/10/15 0713 11/10/15 1111  GLUCAP 103* 150* 164*    Assessment/Plan: S/P Procedure(s) (LRB): Right/Left Heart Cath and Coronary/Graft Angiography (N/A) Intra-Aortic Balloon Pump Insertion Diuresis Ventilator wean with pressure support trials Continue empiric antibiotics Continue milrinone and sildenafil Possible tracheostomy by later  this week  LOS: 21 days    Christopher [name] 11/10/2015

## 2015-11-10 NOTE — Progress Notes (Addendum)
Patient ID: Christopher [name], male   DOB: 04-14-1961, 55 y.o.   MRN: [id number]   Christopher [name] is a 55 year old with a history of HTN, TIA, CHB, DMII, and marijuana use. On March 14th he presented to [facility] with increased dyspnea. On arrival he had inferior MI and CHB. Urgently transferred to [facility] for cath. LHC Ramus lesion, 70% stenosed, Prox LAD lesion, 95% stenosed, and prox RCA lesion, 100% stenosed. Developed pulmonary edema post cath and was diuresed IV lasix.   Cardiac Surgery consulted---10/27/15 S/P CABG x4 LIMA to LAD, SVG to DIAGONAL, SVG to RAMUS, INTERMEDIATE, SVG to RDA 3 and R CEA. Extubated 3/22. IABP removed 3/23 and remained on dopamine, milrinone, and epi. 3/23 had VF arrest requiring CPR shock x4. Re-intubated. CXR-infiltrate R lung base. Started on milrinone 0.2 mcg, amio drip, norepinephrine. Nitric oxide restarted at 10 ppm.  He had vfib arrest again 3/24 pm and lidocaine added.  VT 3/26-3/27 overnight again, 8 shocks, procainamide added and no further VT.  Stopped norepinephrine but milrinone up to 0.5 still with marginal co-ox.  In effort to decrease milrinone and decrease drive towards VT, he was taken back to cath lab and IABP placed again.  Milrinone decreased to 0.375 then 0.25.    He is now off lidocaine and procainamide.  Remains intubated but is awake. IABP removed 4/2. Milrinone 0.25. Lasix now at 10 mg/hr with metolazone 5 daily.  UOP remains excellent. Creatinine 3.4-> 3.5-> 3.7 -> 3.83 -> 4.1 -> 3.6, BUN 156.  Co-ox 66% this morning with CVP still 21.  Short run NSVT yesterday, otherwise only PVCs.  Pacer off.    ECHO 10/02/15 EF 25-30% RV normal AK entire anteroseptal and inferoseptal myocardium.  TEE 10/27/2015: EF 35-40% RV mildly dilated TR mod regurg.  ECHO 10/30/15: EF 25-30% ECHO 11/06/15: EF 30-35%  LHC/RHC/IABP placement 3/27 Left Anterior Descending  Native left coronary was not injected to save contrast. LIMA-LAD was patent. SVG-small diagonal was patent.  There was marked size mismatch between graft and artery with resulting slow flow. There appeared to be about 80% stenosis in the diagonal distal to touchdown of graft.      Ramus Intermedius  Native LCA not injected to save contrast. SVG-ramus patent. There was a size mismatch between large vein graft and smaller caliber native artery. There appeared to be 80% stenosis in the ramus distal to the SVG touchdown and 70% stenosis proximal to SVG touchdown.     Left Circumflex  Not visualized, native LCA not injected to save contrast.     Right Coronary Artery  Native RCA known to be occluded, not injected. SVG-PDA patent.       Right Heart Pressures Procedural Findings: Hemodynamics (mmHg), on milrinone 0.5 RA mean 19 RV 44/23 PA 43/25, mean 33 PCWP mean 26 AO 124/71 Oxygen saturations: PA 64% AO 100% Cardiac Output (Fick) 6.52  Cardiac Index (Fick) 2.86 Cardiac Output (Thermo) 4.97 Cardiac Index (Thermo) 2.18    Scheduled Meds: . amiodarone  150 mg Intravenous Once  . antiseptic oral rinse  7 mL Mouth Rinse QID  . aspirin  325 mg Per Tube Daily  . atorvastatin  80 mg Oral q1800  . bisacodyl  10 mg Oral Daily   Or  . bisacodyl  10 mg Rectal Daily  . chlorhexidine gluconate (SAGE KIT)  15 mL Mouth Rinse BID  . feeding supplement (PRO-STAT SUGAR FREE 64)  30 mL Per Tube TID  . fentaNYL (SUBLIMAZE) injection  25  mcg Intravenous Once  . hydrALAZINE  25 mg Oral 3 times per day  . insulin aspart  0-24 Units Subcutaneous 6 times per day  . insulin detemir  12 Units Subcutaneous BID  . levalbuterol  1.25 mg Nebulization Q6H WA  . levofloxacin (LEVAQUIN) IV  750 mg Intravenous Q48H  . metolazone  5 mg Oral Daily  . midazolam  1 mg Intravenous Once  . pantoprazole (PROTONIX) IV  40 mg Intravenous Daily  . potassium chloride  40 mEq Oral 3 times per day  . sennosides  5 mL Per Tube BID  . sildenafil  40 mg Oral TID  . silver sulfADIAZINE   Topical BID  . sodium chloride  flush  10-40 mL Intracatheter Q12H  . triamcinolone cream   Topical BID  . vancomycin  1,250 mg Intravenous Q48H   Continuous Infusions: . sodium chloride 10 mL/hr at 11/10/15 0900  . amiodarone 30 mg/hr (11/10/15 0900)  . feeding supplement (VITAL HIGH PROTEIN) 1,000 mL (11/10/15 0900)  . fentaNYL infusion INTRAVENOUS 300 mcg/hr (11/10/15 0948)  . furosemide (LASIX) infusion 10 mg/hr (11/10/15 0900)  . midazolam (VERSED) infusion 5 mg/hr (11/10/15 0949)  . milrinone 0.25 mcg/kg/min (11/10/15 0900)  . norepinephrine (LEVOPHED) Adult infusion Stopped (11/01/15 1930)   PRN Meds:.diphenhydrAMINE, fentaNYL, midazolam, ondansetron (ZOFRAN) IV, sodium chloride flush, traMADol    Filed Vitals:   11/10/15 0800 11/10/15 0815 11/10/15 0900 11/10/15 0925  BP: 120/74 121/74 121/74 121/74  Pulse: 71 85 80 85  Temp:  98 F (36.7 C)    TempSrc:  Oral    Resp: 17 11 16 16   Height:      Weight:      SpO2: 98% 95% 92% 95%    Intake/Output Summary (Last 24 hours) at 11/10/15 0949 Last data filed at 11/10/15 0900  Gross per 24 hour  Intake 3002.2 ml  Output   5210 ml  Net -2207.8 ml    LABS: Basic Metabolic Panel:  Recent Labs  11/09/15 0345 11/09/15 1519 11/10/15 0330  NA 142 140 141  K 4.0 4.2 3.6  CL 96* 94* 94*  CO2 31  --  31  GLUCOSE 178* 175* 140*  BUN 140* 139* 156*  CREATININE 3.83* 4.10* 3.60*  CALCIUM 9.2  --  8.7*  MG 3.0*  --  2.8*  PHOS 6.7*  --  5.9*   Liver Function Tests:  Recent Labs  11/09/15 0345 11/10/15 0330  AST 49* 47*  ALT 52 52  ALKPHOS 89 75  BILITOT 1.1 0.6  PROT 6.7 6.2*  ALBUMIN 2.7* 2.5*   No results for input(s): LIPASE, AMYLASE in the last 72 hours. CBC:  Recent Labs  11/09/15 0345 11/09/15 1519 11/10/15 0330  WBC 12.3*  --  12.0*  HGB 8.9* 9.9* 7.6*  HCT 28.0* 29.0* 24.5*  MCV 92.1  --  93.2  PLT 309  --  313   Cardiac Enzymes: No results for input(s): CKTOTAL, CKMB, CKMBINDEX, TROPONINI in the last 72  hours. BNP: Invalid input(s): POCBNP D-Dimer: No results for input(s): DDIMER in the last 72 hours. Hemoglobin A1C: No results for input(s): HGBA1C in the last 72 hours. Fasting Lipid Panel: No results for input(s): CHOL, HDL, LDLCALC, TRIG, CHOLHDL, LDLDIRECT in the last 72 hours. Thyroid Function Tests: No results for input(s): TSH, T4TOTAL, T3FREE, THYROIDAB in the last 72 hours.  Invalid input(s): FREET3 Anemia Panel: No results for input(s): VITAMINB12, FOLATE, FERRITIN, TIBC, IRON, RETICCTPCT in the last 72 hours.  RADIOLOGY: Dg Chest 2 View  10/23/2015  CLINICAL DATA:  CHF.  Weakness. EXAM: CHEST  2 VIEW COMPARISON:  10/21/2015 FINDINGS: Multiple interstitial densities in the lower chest are suggestive for interstitial edema, right side greater than left. Heart size is upper limits of normal. Trachea is midline. Negative for a pneumothorax. Evidence for a small right pleural effusion. IMPRESSION: Interstitial densities in the lower chest, right side greater than left. Findings are suggestive for interstitial pulmonary edema. Difficult to exclude some airspace disease in the right lower lung. Evidence for small right pleural effusion. Electronically Signed   By: Markus Daft M.D.   On: 10/23/2015 08:27   Dg Abd 1 View  11/03/2015  CLINICAL DATA:  Enteric tube advanced to post pyloric position. EXAM: ABDOMEN - 1 VIEW COMPARISON:  11/02/2015 abdominal radiographs. FINDINGS: Fluoroscopy time 7 minutes. Enteric tube courses through the stomach and duodenum and appears to terminate near the duodenal jejunal junction. Instilled contrast opacifies nondilated proximal jejunal small bowel loops. No contrast leak is demonstrated. Separate enteric tube terminates in the body of the stomach. Visualized lower sternotomy wires appear aligned and intact. IMPRESSION: Enteric tube appears to terminate near the duodenal jejunal junction. Separate enteric tube terminates in the body of the stomach.  Electronically Signed   By: Ilona Sorrel M.D.   On: 11/03/2015 17:02   Dg Chest Port 1 View  11/10/2015  CLINICAL DATA:  Shortness of breath . EXAM: PORTABLE CHEST 1 VIEW COMPARISON:  11/09/2015. FINDINGS: Endotracheal tube, NG tube, right PICC line, left subclavian line in stable position. Prior CABG. Cardiomegaly. Left lower lobe atelectasis and/or infiltrate with small left pleural effusion. Mild right base atelectasis and or infiltrate. No pneumothorax . IMPRESSION: 1.  Lines and tubes in stable position. 2. Left lower lobe atelectasis and/or infiltrate with small left pleural effusion. Mild right base atelectasis and/or infiltrate. Findings stable from prior exam. 3.  Prior CABG.  Stable cardiomegaly. Electronically Signed   By: Marcello Moores  Register   On: 11/10/2015 07:51   Dg Chest Port 1 View  11/09/2015  CLINICAL DATA:  Atelectasis. EXAM: PORTABLE CHEST 1 VIEW COMPARISON:  11/08/2015 FINDINGS: Endotracheal tube in satisfactory position. Right arm PICC tip in the lower SVC unchanged. Left subclavian central venous catheter in the SVC. NG tube and feeding tube enters the stomach with the tips not visualized. Negative for pneumothorax. Left lower lobe airspace disease unchanged. Small left effusion unchanged. Mild pulmonary vascular congestion unchanged. IMPRESSION: Support lines remain in satisfactory position and unchanged Left lower lobe atelectasis/ infiltrate unchanged. Mild right lower lobe airspace consolidation stable. Mild vascular congestion unchanged. Electronically Signed   By: Franchot Gallo M.D.   On: 11/09/2015 07:36   Dg Chest Port 1 View  11/08/2015  CLINICAL DATA:  Respiratory failure, hypertension, coronary artery disease post MI and CABG EXAM: PORTABLE CHEST 1 VIEW COMPARISON:  Portable exam 1216 hours compared to 11/06/2015 FINDINGS: Tip of endotracheal tube projects 6.8 cm above carina. Nasogastric tube and feeding tube extends into stomach. LEFT subclavian central venous catheter with  tip projecting over SVC. RIGHT arm PICC line tip projects over SVC. Enlargement of cardiac silhouette post CABG. Stable mediastinal contours. Increased LEFT lower lobe consolidation. Slightly increased infiltrate at RIGHT base as well. Minimal central peribronchial thickening. No pleural effusion or pneumothorax. IMPRESSION: Enlargement of cardiac silhouette post CABG. Stable line and tube positions. Increased LEFT lower lobe consolidation with minimally increased infiltrate at RIGHT base as well. Findings are suspicious for pneumonia involving the LEFT  lower lobe though a component of coexisting pulmonary edema may be present. Electronically Signed   By: Lavonia Dana M.D.   On: 11/08/2015 12:47   Dg Chest Port 1 View  11/06/2015  CLINICAL DATA:  Acute CHF secondary to acute MI, complete heart block. EXAM: PORTABLE CHEST 1 VIEW COMPARISON:  Portable chest x-ray of November 05, 2015 FINDINGS: The lungs are adequately inflated. The pulmonary interstitial markings are less prominent on the right today. They have improved on the left as well and the hemidiaphragm is now visible. There is persistent interstitial edema on the left. The cardiac silhouette remains enlarged. The intraaortic balloon pump marker tip projects over the proximal portion of descending thoracic aorta and appears stable. The endotracheal tube tip projects 5.5 cm above the carina. The nasogastric and Doppler AH feeding tubes have their tips below the inferior margin of the image. The right-sided PICC line tip projects over the junction of the middle and distal thirds of the SVC. The left subclavian venous catheter tip projects over the midportion of the SVC. IMPRESSION: Decreasing pulmonary edema consistent with improvement in CHF. Persistent perihilar interstitial edema on the left. The support tubes and devices are in reasonable position. Electronically Signed   By: David  Martinique M.D.   On: 11/06/2015 07:32   Dg Chest Port 1 View  11/05/2015   CLINICAL DATA:  Respiratory failure. EXAM: PORTABLE CHEST 1 VIEW COMPARISON:  11/04/2015 . FINDINGS: Endotracheal tube, NG tube, right PICC line stable position. Prior CABG. Cardiomegaly. Persistent left mid and lower lung field infiltrate. Persistent mild atelectatic changes right upper and right lower lobes. No pneumothorax. IMPRESSION: 1.  Lines and tubes in stable position. 2. Persistent left mid and left lower lung infiltrate, no interim change. Persistent right upper lobe and right lower lobe subsegmental atelectasis. No interim change. 3. Prior CABG.  Stable cardiomegaly. Electronically Signed   By: Marcello Moores  Register   On: 11/05/2015 07:48   Dg Chest Port 1 View  11/04/2015  CLINICAL DATA:  Hypoxia EXAM: PORTABLE CHEST 1 VIEW COMPARISON:  November 04, 2015 FINDINGS: Endotracheal tube tip is 5.3 cm above the carina. Central catheter tip is in the superior vena cava near the cavoatrial junction. Nasogastric tube tip and side port are below the diaphragm. There also is a feeding tube with the tip below the diaphragm. No pneumothorax. There has been removal of a left chest tube. There is airspace consolidation throughout the left mid and lower lung zones, stable. There is mild atelectasis in the right upper lobe, stable. No new opacity is evident. There is stable cardiomegaly. The pulmonary vascularity is normal. No adenopathy is evident. IMPRESSION: Tube and catheter positions as described without pneumothorax. Airspace consolidation throughout the left mid and lower lung zones, stable. Atelectasis right upper lobe, stable. No new opacity. Stable cardiomegaly. Electronically Signed   By: Lowella Grip III M.D.   On: 11/04/2015 14:52   Dg Chest Port 1 View  11/04/2015  CLINICAL DATA:  Status post CABG 10/28/2015. EXAM: PORTABLE CHEST 1 VIEW COMPARISON:  Single view of the chest 11/03/2015 and 11/02/2015. FINDINGS: Support tubes and lines including a left chest tube are unchanged since the most recent exam.  The chest is better expanded today with decreased atelectasis. Airspace opacity in the left mid and lower lung zones persists. There is likely a left pleural effusion. Marked cardiomegaly is seen with only mild vascular congestion noted. IMPRESSION: Increased pulmonary expansion with decreased scattered atelectasis. Left mid and lower lung zone airspace  disease could be due to atelectasis or pneumonia. Small left pleural effusion noted. Support tubes and lines projecting good position.  No pneumothorax. Electronically Signed   By: Inge Rise M.D.   On: 11/04/2015 07:30   Dg Chest Port 1 View  11/03/2015  CLINICAL DATA:  Central line placement. EXAM: PORTABLE CHEST 1 VIEW COMPARISON:  11/03/2015. FINDINGS: Left subclavian central line noted with tip at the cavoatrial junction. Interval removal of right chest tube. Endotracheal tube, NG tube, right PICC line, left IJ sheath left chest tube in stable position. No pneumothorax. Prior CABG. Stable cardiomegaly. Low lung volumes. A mild component of congestive heart failure cannot be excluded. Similar findings noted on prior exam P IMPRESSION: 1. Interim placement of left subclavian central line, its tip is at the cavoatrial junction. Interim removal right chest tube . No pneumothorax. 2. Remaining lines and tubes including left chest tube in stable position. 3. Prior CABG. Stable cardiomegaly. Low lung volumes. A a mild component congestive heart failure cannot be excluded . Similar findings noted on prior exam. Electronically Signed   By: Bea[place]   On: 11/03/2015 09:58   Dg Chest Port 1 View  11/03/2015  CLINICAL DATA:  Status post CABG on October 27, 2015 persistent left lower lobe atelectasis or pneumonia. No pneumothorax or significant pleural effusion. EXAM: PORTABLE CHEST 1 VIEW COMPARISON:  Portable chest x-ray of November 02, 2015 at 5:48 a.m. FINDINGS: There has been interval placement of an intra aortic balloon pump whose marker lies over the  junction of the aortic arch with the proximal descending thoracic aorta. The cardiac silhouette remains enlarged. The pulmonary vascularity remains mildly prominent. The retrocardiac region on the left remains dense. Bilateral chest tubes are in stable position. There is no pneumothorax nor significant pleural effusion. The endotracheal tube tip lies approximately 6.5 cm above the carina. The esophagogastric tube tip projects below the inferior margin of the image. The right-sided PICC line tip projects over the midportion of the SVC. A previously demonstrated pericardial drain is not clearly evident on today's study. IMPRESSION: Mild CHF. Persistent left lower lobe atelectasis or pneumonia. Interval placement of an intra aortic balloon pump which appears to be in appropriate position radiographically. The other support tubes and lines are in reasonable position. Electronically Signed   By: David  Martinique M.D.   On: 11/03/2015 07:27   Dg Chest Port 1 View  11/02/2015  CLINICAL DATA:  CABG. EXAM: PORTABLE CHEST 1 VIEW COMPARISON:  11/01/2015. FINDINGS: Endotracheal tube, NG tube, right PICC line, bilateral chest tubes in stable position. Prior CABG. Stable cardiomegaly . Persistent mild interstitial prominence, congestive heart failure cannot be excluded. No interim change. Low lung volumes with basilar atelectasis . IMPRESSION: 1. Endotracheal tube, NG tube, right PICC line, bilateral chest tubes in stable position . No pneumothorax. 2. Prior CABG. Cardiomegaly with pulmonary interstitial prominence and small left pleural effusion consistent with mild congestive heart failure. No interim change from prior exam . Electronically Signed   By: Marcello Moores  Register   On: 11/02/2015 07:20   Dg Chest Port 1 View  11/01/2015  CLINICAL DATA:  Post CABG EXAM: PORTABLE CHEST 1 VIEW COMPARISON:  10/31/2015 FINDINGS: Cardiomediastinal silhouette is stable. Status post CABG. Stable endotracheal and NG tube position. Left chest  tube is unchanged in position. Persistent central vascular congestion and mild interstitial prominence bilaterally suspicious for the mild interstitial edema. Left IJ sheath in place. Probable small left pleural effusion left basilar atelectasis or infiltrate. Right arm  PICC line is unchanged in position. There is no pneumothorax. IMPRESSION: Status post CABG. Stable support apparatus. Stable left chest tube position. No pneumothorax. Again noted bilateral mild interstitial prominence and perihilar opacities suspicious for mild pulmonary edema. Probable small left pleural effusion left basilar atelectasis or infiltrate. Electronically Signed   By: Lahoma Crocker M.D.   On: 11/01/2015 09:57   Dg Chest Port 1 View  10/31/2015  CLINICAL DATA:  55 year old male with a history of endotracheal tube placement Patient has undergone coronary artery bypass grafting x4 10/27/2015. Placement of intra aortic balloon pump at this date. Ischemic cardiomyopathy. EXAM: PORTABLE CHEST 1 VIEW COMPARISON:  10/30/2015. FINDINGS: Endotracheal tube terminates approximately 3.4 cm above the carina, unchanged. Gastric tube terminates out of the field of view. Defibrillator pads project over the left and right chest. Bilateral thoracostomy tubes. Right upper extremity PICC. The mediastinal drain not visualized. Intra-aortic balloon pump not visualized. Surgical changes of median sternotomy and CABG. Mixed bilateral interstitial and airspace opacities. Low lung volumes. IMPRESSION: Low lung volumes with mixed bilateral interstitial and airspace opacities, similar to the comparison chest x-ray. Retrocardiac opacity may reflect persistent pleural fluid, and/or atelectasis. Surgical support apparatus appear unchanged, as above. Intra aortic balloon pump not visualized. Signed, Dulcy Fanny. Earleen Newport, DO [facility] Electronically Signed   By: Corrie Mckusick D.O.   On: 10/31/2015 07:25   Dg  Chest Port 1 View  10/30/2015  CLINICAL DATA:  Post cavity.  Recent CABG. EXAM: PORTABLE CHEST 1 VIEW COMPARISON:  Portable film earlier today. FINDINGS: Overlying support apparatus redemonstrated. Tubes and lines remain stable. Elevated LEFT hemidiaphragm. No definite pneumothorax. Moderate vascular congestion may be increased. Cardiomegaly. IMPRESSION: Slight worsening aeration. Moderate vascular congestion may be increased. Electronically Signed   By: Staci Righter M.D.   On: 10/30/2015 18:39   Dg Chest Port 1 View  10/30/2015  CLINICAL DATA:  Status post coronary bypass grafting EXAM: PORTABLE CHEST 1 VIEW COMPARISON:  10/30/2015 FINDINGS: Cardiac shadow remains enlarged. Bilateral chest tubes are again seen. A right-sided PICC line and endotracheal tube are noted in satisfactory position. Left jugular sheath is noted. Elevation of left hemidiaphragm is noted with left basilar atelectasis. No pneumothorax is noted. IMPRESSION: Postsurgical change with tubes and lines as described. Mild left basilar atelectasis. Electronically Signed   By: Inez Catalina M.D.   On: 10/30/2015 07:45   Dg Chest Port 1 View  10/30/2015  CLINICAL DATA:  A chest tube in place EXAM: PORTABLE CHEST 1 VIEW COMPARISON:  10/30/2015 FINDINGS: Postoperative changes in the mediastinum. Endotracheal tube with tip measuring 6.3 cm about the carina. Right PICC catheter with tip over the cavoatrial junction. Left central venous catheter sheath with tip over the left neck consistent location in the internal jugular vein. Bilateral chest tubes. Infiltration or atelectasis in the right lung base. Cardiac enlargement. No significant vascular congestion. No pneumothorax. IMPRESSION: Appliance positioned as described. Cardiac enlargement with infiltration or atelectasis in the right lung base. Bilateral chest tubes are present but no visualized pneumothorax. Electronically Signed   By: Lucienne Capers M.D.   On: 10/30/2015 03:07   Dg Chest Port  1 View  10/30/2015  CLINICAL DATA:  Shortness of breath.  Code blue. EXAM: PORTABLE CHEST 1 VIEW COMPARISON:  10/29/2015 FINDINGS: Postoperative changes in the mediastinum. Bilateral chest tubes are present. Right PICC line with tip over the cavoatrial junction. Left central venous catheter or sheath with tip over the left side of the  neck, likely in the left internal jugular vein. Cardiac enlargement without significant vascular congestion. Shallow inspiration with elevation of the left hemidiaphragm. Probable left pleural effusion. No definite pulmonary consolidation. IMPRESSION: Shallow inspiration. Probable left pleural effusion. Cardiac enlargement. Appliances appear in satisfactory position. Electronically Signed   By: Lucienne Capers M.D.   On: 10/30/2015 01:18   Dg Chest Port 1 View  10/29/2015  CLINICAL DATA:  Postop CABG 2 days ago. EXAM: PORTABLE CHEST 1 VIEW COMPARISON:  Portable chest x-ray of October 28, 2015 FINDINGS: The trachea and esophagus have been extubated. The cardiac silhouette remains enlarged. The pulmonary vascularity is mildly engorged. The bilateral chest tubes and the mediastinal drain are in stable position. There is no pneumothorax or large pleural effusion. The Swan-Ganz catheter tip overlies a proximal right lower lobe pulmonary artery branch. The mediastinum is widened and accentuated by the hypo inflation. IMPRESSION: Bilateral hypoinflation following extubation of the trachea there is crowding of the pulmonary vascularity. Left basilar atelectasis or less likely pneumonia is more prominent today. Probable small left pleural effusion. The remaining support tubes and lines are in stable position. Electronically Signed   By: David  Martinique M.D.   On: 10/29/2015 07:40   Dg Chest Port 1 View  10/28/2015  CLINICAL DATA:  Post CABG, on ventilator, followup portable chest x-ray of 10/28/2015 EXAM: PORTABLE CHEST 1 VIEW COMPARISON:  None. FINDINGS: The tip of the endotracheal tube is  approximately 5.2 cm above the carina. Aeration has improved slightly. Atelectasis remains at the lung bases left-greater-than-right. Swan-Ganz catheter tip is in the right lower lobe pulmonary artery and bilateral chest tubes remain. No pneumothorax is seen. Heart size is stable. IMPRESSION: 1. Improved aeration. 2. Bilateral chest tubes.  No pneumothorax. 3. Tip of endotracheal tube approximately 5.2 cm above the carina. Electronically Signed   By: Ivar Drape M.D.   On: 10/28/2015 08:01   Dg Chest Port 1 View  10/27/2015  CLINICAL DATA:  Status post CABG EXAM: PORTABLE CHEST 1 VIEW COMPARISON:  10/27/2015 FINDINGS: Unchanged tracheostomy tube. Swan-Ganz central venous line is now seen with tip advanced into the right perihilar region. Stable right chest tube with and left chest tube. NG tube again crosses the gastroesophageal junction. Stable mediastinal drain. Enlarged cardiac silhouette. Limited inspiratory effect with bibasilar opacities likely representing atelectasis. IMPRESSION: Anticipated postoperative appearance with bibasilar atelectasis. Electronically Signed   By: Skipper Cliche M.D.   On: 10/27/2015 19:25   Dg Chest Portable 1 View  10/27/2015  CLINICAL DATA:  Status post coronary bypass grafting EXAM: PORTABLE CHEST 1 VIEW COMPARISON:  10/23/2015 FINDINGS: Cardiac shadow is mildly enlarged. A Swan-Ganz catheter is noted in the right pulmonary outflow tract. Bilateral thoracostomy catheters are seen. No pneumothorax is noted. An endotracheal tube is noted in satisfactory position. Intra-aortic balloon pump is noted over the aortic arch. Mild left basilar atelectasis is seen. Mild central vascular congestion is noted. IMPRESSION: Postoperative changes with tubes and lines as described above. Mild vascular congestion and left basilar atelectasis. Electronically Signed   By: Inez Catalina M.D.   On: 10/27/2015 17:23   Portable Chest X-ray 1 View  10/21/2015  CLINICAL DATA:  55 year old male  with a history of acute systolic congestive heart failure EXAM: PORTABLE CHEST 1 VIEW COMPARISON:  10/20/2015 FINDINGS: Heart size unchanged, enlarged. Similar appearance of low lung volumes with interstitial opacities, interlobular septal thickening, and developing airspace disease of the bilateral lower lungs. No pneumothorax. Linear opacity overlying the mediastinum and the heart  border, of on certain significance. Favored to overlie the patient. IMPRESSION: Evidence of worsening congestive heart failure, with increasing edema. New linear opacity overlies the mediastinum, favored to be overlying the patient, though cannot be localized on this view. Signed, Dulcy Fanny. Earleen Newport, DO [facility] Electronically Signed   By: Corrie Mckusick D.O.   On: 10/21/2015 07:16   Dg Chest Portable 1 View  10/20/2015  CLINICAL DATA:  Shortness of breath. EXAM: PORTABLE CHEST 1 VIEW COMPARISON:  None. FINDINGS: Midline trachea. Cardiomegaly accentuated by AP portable technique. No pleural effusion or pneumothorax. Mildly low lung volumes with bibasilar atelectasis. IMPRESSION: Mild cardiomegaly and low lung volumes.  No acute findings. Electronically Signed   By: Abigail Miyamoto M.D.   On: 10/20/2015 18:09   Dg Abd Portable 1v  11/02/2015  CLINICAL DATA:  Assess feeding tube positioning EXAM: PORTABLE ABDOMEN - 1 VIEW COMPARISON:  None in PACs FINDINGS: The repeated KUB with has less motion artifact. The tip of the feeding tube and nasogastric tube lie in the region of the distal gastric body in the pre-pyloric region. The bowel gas pattern is within the limits of normal. Numerous tubes and lines and electrodes overlie the abdomen. IMPRESSION: The tips of the feeding tube and the esophagogastric tube lie in the region of the distal gastric body in the pre-pyloric region. Electronically Signed   By: David  Martinique M.D.   On: 11/02/2015 12:16   Dg Addison Bailey G Tube Plc W/fl-no  Rad  11/03/2015  CLINICAL DATA:  NASO G TUBE PLACEMENT WITH FLUORO Fluoroscopy was utilized by the requesting physician.  No radiographic interpretation.    PHYSICAL EXAM General: Awake on vent Neck: JVP hard to see.  no thyromegaly or thyroid nodule.  Lungs: Decreased breath sounds at bases bilaterally.  CV: Nondisplaced PMI.  Heart regular S1/S2, no S3/S4, no murmur. Trace ankle edema. Abdomen: Soft, nontender, no hepatosplenomegaly, no distention.  Neurologic: Alert and follows commands.  Psych: Normal affect. Extremities: No clubbing or cyanosis. Right groin IABP site stable. Dopplerable pedal pulses. Maculopapular rash on his sides  TELEMETRY: Reviewed telemetry, NSR with PVCs  ASSESSMENT AND PLAN:  1. CAD: S/p late presentation inferior MI and CABG x 4. Continue statin, ASA. Coronary angiography 3/27 with multiple VT episodes showed patent LIMA-LAD and SVG-RCA.  Patent SVG-ramus and SVG-D but slow flow in both grafts with diffusely diseased small target vessels.  2. Cardiogenic shock: Acute systolic CHF with RV failure post inferior MI with suspected RV involvement. EF 25-30% on echo (difficult images on 3/24 echo). He has significant RV failure from suspected RV infarct.  3/27 IABP placed to try to allow decrease in milrinone with frequent VT.  3/31 echo with EF 30-35%.  IABP out 4/2.  Good co-ox this morning at 66%. CVP remains 21 despite good UOP. - Continue current Lasix gtt and metolazone, I/Os running negative.    - He is now off NO and on sildenafil 40 tid.   - Continue milrinone at 0.25 - Increase hydralazine to 25 mg tid for afterload reduction (no Imdur with sildenafil use).   3. Complete heart block: Pacemaker turned off 4/3.  Underlying NSR in 70s.  No ectopy so far.  4. VF arrest 3/24 early am and again at night 3/24.  Only occasional PVCs today.  - Continue amiodarone.  5. AKI: Creatinine lower today but BUN higher.  May be related to steroids => steroids stopped.   Suspect ATN. Baseline creatinine 1.0.  Renal following, given good UOP and apparent lack of uremic symptoms, no RRT yet. 6. ID: Possible LLL PNA.  He is on vancomycin/levofloxacin.  7. Anemia: No overt bleeding but hemoglobin lower today.  Groin cath site looks ok.  Will get 2 units PRBCs today.   The patient is critically ill with multiple organ systems failure and requires high complexity decision making for assessment and support, frequent evaluation and titration of therapies, application of advanced monitoring technologies and extensive interpretation of multiple databases.   Critical Care Time devoted to patient care services described in this note is 40 Minutes.  Loralie Champagne 11/10/2015 9:49 AM

## 2015-11-11 ENCOUNTER — Inpatient Hospital Stay (HOSPITAL_COMMUNITY): Payer: BLUE CROSS/BLUE SHIELD

## 2015-11-11 ENCOUNTER — Encounter (HOSPITAL_COMMUNITY): Payer: BLUE CROSS/BLUE SHIELD

## 2015-11-11 DIAGNOSIS — Z930 Tracheostomy status: Secondary | ICD-10-CM | POA: Insufficient documentation

## 2015-11-11 DIAGNOSIS — Z9911 Dependence on respirator [ventilator] status: Secondary | ICD-10-CM | POA: Insufficient documentation

## 2015-11-11 DIAGNOSIS — J9601 Acute respiratory failure with hypoxia: Secondary | ICD-10-CM

## 2015-11-11 LAB — CBC
HCT: 33.9 % — ABNORMAL LOW (ref 39.0–52.0)
Hemoglobin: 10.3 g/dL — ABNORMAL LOW (ref 13.0–17.0)
MCH: 27.6 pg (ref 26.0–34.0)
MCHC: 30.4 g/dL (ref 30.0–36.0)
MCV: 90.9 fL (ref 78.0–100.0)
Platelets: 352 10*3/uL (ref 150–400)
RBC: 3.73 MIL/uL — ABNORMAL LOW (ref 4.22–5.81)
RDW: 17.9 % — ABNORMAL HIGH (ref 11.5–15.5)
WBC: 12.8 10*3/uL — ABNORMAL HIGH (ref 4.0–10.5)

## 2015-11-11 LAB — GLUCOSE, CAPILLARY
GLUCOSE-CAPILLARY: 105 mg/dL — AB (ref 65–99)
GLUCOSE-CAPILLARY: 98 mg/dL (ref 65–99)
GLUCOSE-CAPILLARY: 122 mg/dL — AB (ref 65–99)
Glucose-Capillary: 111 mg/dL — ABNORMAL HIGH (ref 65–99)

## 2015-11-11 LAB — TYPE AND SCREEN
ABO/RH(D): A POS
Antibody Screen: NEGATIVE
Unit division: 0
Unit division: 0

## 2015-11-11 LAB — RENAL FUNCTION PANEL
ALBUMIN: 2.8 g/dL — AB (ref 3.5–5.0)
ANION GAP: 18 — AB (ref 5–15)
BUN: 164 mg/dL — AB (ref 6–20)
CALCIUM: 9.6 mg/dL (ref 8.9–10.3)
CO2: 37 mmol/L — ABNORMAL HIGH (ref 22–32)
CREATININE: 3.33 mg/dL — AB (ref 0.61–1.24)
Chloride: 95 mmol/L — ABNORMAL LOW (ref 101–111)
GFR calc Af Amer: 23 mL/min — ABNORMAL LOW (ref >60)
GFR calc non Af Amer: 19 mL/min — ABNORMAL LOW (ref >60)
GLUCOSE: 116 mg/dL — AB (ref 65–99)
PHOSPHORUS: 5.7 mg/dL — AB (ref 2.5–4.6)
Potassium: 3.1 mmol/L — ABNORMAL LOW (ref 3.5–5.1)
SODIUM: 150 mmol/L — AB (ref 135–145)

## 2015-11-11 LAB — POCT I-STAT, CHEM 8
BUN: >140 mg/dL — ABNORMAL HIGH (ref 6–20)
CALCIUM ION: 1.16 mmol/L (ref 1.12–1.23)
Chloride: 95 mmol/L — ABNORMAL LOW (ref 101–111)
Creatinine, Ser: 3.1 mg/dL — ABNORMAL HIGH (ref 0.61–1.24)
Glucose, Bld: 99 mg/dL (ref 65–99)
HEMATOCRIT: 38 % — AB (ref 39.0–52.0)
HEMOGLOBIN: 12.9 g/dL — AB (ref 13.0–17.0)
Potassium: 3.1 mmol/L — ABNORMAL LOW (ref 3.5–5.1)
SODIUM: 150 mmol/L — AB (ref 135–145)
TCO2: 39 mmol/L (ref 0–100)

## 2015-11-11 LAB — POCT I-STAT 3, ART BLOOD GAS (G3+)
ACID-BASE EXCESS: 16 mmol/L — AB (ref 0.0–2.0)
Bicarbonate: 41.2 mEq/L — ABNORMAL HIGH (ref 20.0–24.0)
O2 SAT: 94 %
PCO2 ART: 54.7 mmHg — AB (ref 35.0–45.0)
TCO2: 43 mmol/L (ref 0–100)
pH, Arterial: 7.485 — ABNORMAL HIGH (ref 7.350–7.450)
pO2, Arterial: 68 mmHg — ABNORMAL LOW (ref 80.0–100.0)

## 2015-11-11 LAB — CARBOXYHEMOGLOBIN
Carboxyhemoglobin: 1.3 % (ref 0.5–1.5)
Methemoglobin: 0.8 % (ref 0.0–1.5)
O2 Saturation: 72.2 %
Total hemoglobin: 12.4 g/dL — ABNORMAL LOW (ref 13.5–18.0)

## 2015-11-11 LAB — VANCOMYCIN, RANDOM: Vancomycin Rm: 24 ug/mL

## 2015-11-11 LAB — APTT: aPTT: 29 seconds (ref 24–37)

## 2015-11-11 LAB — PROTIME-INR
INR: 1.44 (ref 0.00–1.49)
Prothrombin Time: 17.6 seconds — ABNORMAL HIGH (ref 11.6–15.2)

## 2015-11-11 LAB — MAGNESIUM: Magnesium: 2.8 mg/dL — ABNORMAL HIGH (ref 1.7–2.4)

## 2015-11-11 LAB — PHOSPHORUS: Phosphorus: 5.9 mg/dL — ABNORMAL HIGH (ref 2.5–4.6)

## 2015-11-11 MED ORDER — VECURONIUM BROMIDE 10 MG IV SOLR
10.0000 mg | Freq: Once | INTRAVENOUS | Status: AC
  Administered 2015-11-11: 10 mg via INTRAVENOUS

## 2015-11-11 MED ORDER — AMIODARONE IV BOLUS ONLY 150 MG/100ML: 150.0000 mg | Freq: Once | INTRAVENOUS | Status: DC

## 2015-11-11 MED ORDER — VECURONIUM BROMIDE 10 MG IV SOLR
INTRAVENOUS | Status: AC
  Filled 2015-11-11: qty 10

## 2015-11-11 MED ORDER — MIDAZOLAM HCL 2 MG/2ML IJ SOLN
4.0000 mg | Freq: Once | INTRAMUSCULAR | Status: AC
  Administered 2015-11-11: 5 mg via INTRAVENOUS

## 2015-11-11 MED ORDER — HYDRALAZINE HCL 50 MG PO TABS
50.0000 mg | ORAL_TABLET | Freq: Three times a day (TID) | ORAL | Status: DC
  Administered 2015-11-11 – 2015-11-15 (×8): 50 mg via ORAL
  Filled 2015-11-11 (×8): qty 1

## 2015-11-11 MED ORDER — POTASSIUM CHLORIDE 20 MEQ/15ML (10%) PO SOLN
20.0000 meq | Freq: Three times a day (TID) | ORAL | Status: DC
  Administered 2015-11-11 – 2015-11-12 (×3): 20 meq via ORAL
  Filled 2015-11-11 (×3): qty 15

## 2015-11-11 MED ORDER — ETOMIDATE 2 MG/ML IV SOLN
INTRAVENOUS | Status: AC
  Filled 2015-11-11: qty 10

## 2015-11-11 MED ORDER — PROPOFOL 500 MG/50ML IV EMUL
5.0000 ug/kg/min | Freq: Once | INTRAVENOUS | Status: DC
Start: 2015-11-11 — End: 2015-11-15

## 2015-11-11 MED ORDER — POTASSIUM CHLORIDE 10 MEQ/50ML IV SOLN
10.0000 meq | INTRAVENOUS | Status: AC
  Administered 2015-11-11 (×2): 10 meq via INTRAVENOUS
  Filled 2015-11-11: qty 50

## 2015-11-11 MED ORDER — FENTANYL CITRATE (PF) 100 MCG/2ML IJ SOLN
200.0000 ug | Freq: Once | INTRAMUSCULAR | Status: AC
  Administered 2015-11-11: 400 ug via INTRAVENOUS

## 2015-11-11 MED ORDER — POTASSIUM CHLORIDE 10 MEQ/50ML IV SOLN
10.0000 meq | INTRAVENOUS | Status: AC
  Administered 2015-11-11 (×2): 10 meq via INTRAVENOUS
  Filled 2015-11-11 (×2): qty 50

## 2015-11-11 MED ORDER — FREE WATER
250.0000 mL | Freq: Three times a day (TID) | Status: AC
  Administered 2015-11-11 – 2015-11-12 (×4): 250 mL

## 2015-11-11 MED ORDER — AMIODARONE LOAD VIA INFUSION
150.0000 mg | Freq: Once | INTRAVENOUS | Status: AC
  Administered 2015-11-11: 150 mg via INTRAVENOUS
  Filled 2015-11-11: qty 83.34

## 2015-11-11 MED ORDER — ETOMIDATE 2 MG/ML IV SOLN
40.0000 mg | Freq: Once | INTRAVENOUS | Status: AC
  Administered 2015-11-11: 20 mg via INTRAVENOUS

## 2015-11-11 NOTE — Progress Notes (Signed)
Attempted right radial A-line unsuccessful x 2 attempts.  Patient had collateral circulation prior to sticking.Sterile technique was used for attempted  insertion.

## 2015-11-11 NOTE — Progress Notes (Addendum)
Patient ID: Christopher [name], male   DOB: 12-03-1960, 55 y.o.   MRN: [id number]   Christopher [name] is a 55 year old with a history of HTN, TIA, CHB, DMII, and marijuana use. On March 14th he presented to [facility] with increased dyspnea. On arrival he had inferior MI and CHB. Urgently transferred to [facility] for cath. LHC Ramus lesion, 70% stenosed, Prox LAD lesion, 95% stenosed, and prox RCA lesion, 100% stenosed. Developed pulmonary edema post cath and was diuresed IV lasix.   Cardiac Surgery consulted---10/27/15 S/P CABG x4 LIMA to LAD, SVG to DIAGONAL, SVG to RAMUS, INTERMEDIATE, SVG to RDA 3 and R CEA. Extubated 3/22. IABP removed 3/23 and remained on dopamine, milrinone, and epi. 3/23 had VF arrest requiring CPR shock x4. Re-intubated. CXR-infiltrate R lung base. Started on milrinone 0.2 mcg, amio drip, norepinephrine. Nitric oxide restarted at 10 ppm.  He had vfib arrest again 3/24 pm and lidocaine added.  VT 3/26-3/27 overnight again, 8 shocks, procainamide added and no further VT.  Stopped norepinephrine but milrinone up to 0.5 still with marginal co-ox.  In effort to decrease milrinone and decrease drive towards VT, he was taken back to cath lab and IABP placed again.  Milrinone decreased to 0.375 then 0.25.    Pacer now off, no further heart block.   He is now off lidocaine and procainamide.  Remains intubated but is awake. IABP removed 4/2. Milrinone 0.25. Yesterday go Lasix 10 mg/hr with metolazone 5 daily.  UOP remains excellent with weight loss. Creatinine 3.4-> 3.5-> 3.7 -> 3.83 -> 4.1 -> 3.6 -> 3.33, BUN higher at 164.  Co-ox 72% this morning with CVP down to 14.   Na up to 150 today.   He did not do well with SBT yesterday, may need tracheostomy.   ECHO 10/02/15 EF 25-30% RV normal AK entire anteroseptal and inferoseptal myocardium.  TEE 10/27/2015: EF 35-40% RV mildly dilated TR mod regurg.  ECHO 10/30/15: EF 25-30% ECHO 11/06/15: EF 30-35%  LHC/RHC/IABP placement 3/27 Left Anterior  Descending  Native left coronary was not injected to save contrast. LIMA-LAD was patent. SVG-small diagonal was patent. There was marked size mismatch between graft and artery with resulting slow flow. There appeared to be about 80% stenosis in the diagonal distal to touchdown of graft.      Ramus Intermedius  Native LCA not injected to save contrast. SVG-ramus patent. There was a size mismatch between large vein graft and smaller caliber native artery. There appeared to be 80% stenosis in the ramus distal to the SVG touchdown and 70% stenosis proximal to SVG touchdown.     Left Circumflex  Not visualized, native LCA not injected to save contrast.     Right Coronary Artery  Native RCA known to be occluded, not injected. SVG-PDA patent.       Right Heart Pressures Procedural Findings: Hemodynamics (mmHg), on milrinone 0.5 RA mean 19 RV 44/23 PA 43/25, mean 33 PCWP mean 26 AO 124/71 Oxygen saturations: PA 64% AO 100% Cardiac Output (Fick) 6.52  Cardiac Index (Fick) 2.86 Cardiac Output (Thermo) 4.97 Cardiac Index (Thermo) 2.18    Scheduled Meds: . amiodarone  150 mg Intravenous Once  . antiseptic oral rinse  7 mL Mouth Rinse QID  . aspirin  325 mg Per Tube Daily  . atorvastatin  80 mg Oral q1800  . bisacodyl  10 mg Oral Daily   Or  . bisacodyl  10 mg Rectal Daily  . chlorhexidine gluconate (SAGE KIT)  15  mL Mouth Rinse BID  . feeding supplement (PRO-STAT SUGAR FREE 64)  30 mL Per Tube TID  . fentaNYL (SUBLIMAZE) injection  25 mcg Intravenous Once  . free water  250 mL Per Tube 3 times per day  . hydrALAZINE  50 mg Oral 3 times per day  . insulin aspart  0-24 Units Subcutaneous 6 times per day  . insulin detemir  12 Units Subcutaneous BID  . levalbuterol  1.25 mg Nebulization Q6H WA  . levofloxacin (LEVAQUIN) IV  750 mg Intravenous Q48H  . midazolam  1 mg Intravenous Once  . pantoprazole (PROTONIX) IV  40 mg Intravenous Daily  . potassium chloride  10 mEq Intravenous  Q1 Hr x 2  . potassium chloride  20 mEq Oral 3 times per day  . QUEtiapine  50 mg Oral QHS  . sennosides  5 mL Per Tube BID  . sildenafil  40 mg Oral TID  . silver sulfADIAZINE   Topical BID  . sodium chloride flush  10-40 mL Intracatheter Q12H  . triamcinolone cream   Topical BID  . vancomycin  1,250 mg Intravenous Q48H   Continuous Infusions: . sodium chloride 10 mL/hr at 11/11/15 0700  . amiodarone 30 mg/hr (11/11/15 0700)  . feeding supplement (VITAL HIGH PROTEIN) 1,000 mL (11/11/15 0700)  . fentaNYL infusion INTRAVENOUS 300 mcg/hr (11/11/15 0700)  . midazolam (VERSED) infusion 5 mg/hr (11/11/15 0700)  . milrinone 0.25 mcg/kg/min (11/11/15 0700)  . norepinephrine (LEVOPHED) Adult infusion Stopped (11/01/15 1930)   PRN Meds:.diphenhydrAMINE, fentaNYL, midazolam, ondansetron (ZOFRAN) IV, sodium chloride flush, traMADol    Filed Vitals:   11/11/15 0700 11/11/15 0730 11/11/15 0749 11/11/15 0804  BP: 101/63   101/63  Pulse: 77   87  Temp:  98.9 F (37.2 C)    TempSrc:  Oral    Resp: 14   17  Height:      Weight:      SpO2: 95%  95% 96%    Intake/Output Summary (Last 24 hours) at 11/11/15 0818 Last data filed at 11/11/15 0700  Gross per 24 hour  Intake 3446.2 ml  Output   6675 ml  Net -3228.8 ml    LABS: Basic Metabolic Panel:  Recent Labs  11/10/15 0330 11/10/15 1519 11/11/15 0320  NA 141 144 150*  K 3.6 3.7 3.1*  CL 94* 96* 95*  CO2 31  --  37*  GLUCOSE 140* 143* 116*  BUN 156* >140* 164*  CREATININE 3.60* 3.60* 3.33*  CALCIUM 8.7*  --  9.6  MG 2.8*  --  2.8*  PHOS 5.9*  --  5.9*  5.7*   Liver Function Tests:  Recent Labs  11/10/15 0330 11/10/15 1040 11/11/15 0320  AST 47* 50*  --   ALT 52 56  --   ALKPHOS 75 79  --   BILITOT 0.6 1.0  --   PROT 6.2* 6.8  --   ALBUMIN 2.5* 2.8* 2.8*   No results for input(s): LIPASE, AMYLASE in the last 72 hours. CBC:  Recent Labs  11/10/15 0330 11/10/15 1519 11/11/15 0320  WBC 12.0*  --  12.8*    HGB 7.6* 11.6* 10.3*  HCT 24.5* 34.0* 33.9*  MCV 93.2  --  90.9  PLT 313  --  352   Cardiac Enzymes: No results for input(s): CKTOTAL, CKMB, CKMBINDEX, TROPONINI in the last 72 hours. BNP: Invalid input(s): POCBNP D-Dimer: No results for input(s): DDIMER in the last 72 hours. Hemoglobin A1C: No results for input(s):  HGBA1C in the last 72 hours. Fasting Lipid Panel: No results for input(s): CHOL, HDL, LDLCALC, TRIG, CHOLHDL, LDLDIRECT in the last 72 hours. Thyroid Function Tests: No results for input(s): TSH, T4TOTAL, T3FREE, THYROIDAB in the last 72 hours.  Invalid input(s): FREET3 Anemia Panel: No results for input(s): VITAMINB12, FOLATE, FERRITIN, TIBC, IRON, RETICCTPCT in the last 72 hours.  RADIOLOGY: Dg Chest 2 View  10/23/2015  CLINICAL DATA:  CHF.  Weakness. EXAM: CHEST  2 VIEW COMPARISON:  10/21/2015 FINDINGS: Multiple interstitial densities in the lower chest are suggestive for interstitial edema, right side greater than left. Heart size is upper limits of normal. Trachea is midline. Negative for a pneumothorax. Evidence for a small right pleural effusion. IMPRESSION: Interstitial densities in the lower chest, right side greater than left. Findings are suggestive for interstitial pulmonary edema. Difficult to exclude some airspace disease in the right lower lung. Evidence for small right pleural effusion. Electronically Signed   By: Markus Daft M.D.   On: 10/23/2015 08:27   Dg Abd 1 View  11/03/2015  CLINICAL DATA:  Enteric tube advanced to post pyloric position. EXAM: ABDOMEN - 1 VIEW COMPARISON:  11/02/2015 abdominal radiographs. FINDINGS: Fluoroscopy time 7 minutes. Enteric tube courses through the stomach and duodenum and appears to terminate near the duodenal jejunal junction. Instilled contrast opacifies nondilated proximal jejunal small bowel loops. No contrast leak is demonstrated. Separate enteric tube terminates in the body of the stomach. Visualized lower sternotomy  wires appear aligned and intact. IMPRESSION: Enteric tube appears to terminate near the duodenal jejunal junction. Separate enteric tube terminates in the body of the stomach. Electronically Signed   By: Ilona Sorrel M.D.   On: 11/03/2015 17:02   Dg Chest Port 1 View  11/11/2015  CLINICAL DATA:  Respiratory failure. EXAM: PORTABLE CHEST 1 VIEW COMPARISON:  11/10/2015. FINDINGS: Endotracheal tube, feeding tube, bilateral subclavian lines in stable position. Prior CABG. Cardiomegaly with diffuse bilateral pulmonary infiltrates consistent with pulmonary edema. Worsening from prior exam. Small left pleural effusion. No pneumothorax. IMPRESSION: 1.  Lines and tubes in stable position. 2. Prior CABG. Cardiomegaly with worsening bilateral pulmonary edema. Small left pleural effusion. Electronically Signed   By: Marcello Moores  Register   On: 11/11/2015 07:17   Dg Chest Port 1 View  11/10/2015  CLINICAL DATA:  Shortness of breath . EXAM: PORTABLE CHEST 1 VIEW COMPARISON:  11/09/2015. FINDINGS: Endotracheal tube, NG tube, right PICC line, left subclavian line in stable position. Prior CABG. Cardiomegaly. Left lower lobe atelectasis and/or infiltrate with small left pleural effusion. Mild right base atelectasis and or infiltrate. No pneumothorax . IMPRESSION: 1.  Lines and tubes in stable position. 2. Left lower lobe atelectasis and/or infiltrate with small left pleural effusion. Mild right base atelectasis and/or infiltrate. Findings stable from prior exam. 3.  Prior CABG.  Stable cardiomegaly. Electronically Signed   By: Marcello Moores  Register   On: 11/10/2015 07:51   Dg Chest Port 1 View  11/09/2015  CLINICAL DATA:  Atelectasis. EXAM: PORTABLE CHEST 1 VIEW COMPARISON:  11/08/2015 FINDINGS: Endotracheal tube in satisfactory position. Right arm PICC tip in the lower SVC unchanged. Left subclavian central venous catheter in the SVC. NG tube and feeding tube enters the stomach with the tips not visualized. Negative for pneumothorax.  Left lower lobe airspace disease unchanged. Small left effusion unchanged. Mild pulmonary vascular congestion unchanged. IMPRESSION: Support lines remain in satisfactory position and unchanged Left lower lobe atelectasis/ infiltrate unchanged. Mild right lower lobe airspace consolidation stable. Mild vascular congestion  unchanged. Electronically Signed   By: Franchot Gallo M.D.   On: 11/09/2015 07:36   Dg Chest Port 1 View  11/08/2015  CLINICAL DATA:  Respiratory failure, hypertension, coronary artery disease post MI and CABG EXAM: PORTABLE CHEST 1 VIEW COMPARISON:  Portable exam 1216 hours compared to 11/06/2015 FINDINGS: Tip of endotracheal tube projects 6.8 cm above carina. Nasogastric tube and feeding tube extends into stomach. LEFT subclavian central venous catheter with tip projecting over SVC. RIGHT arm PICC line tip projects over SVC. Enlargement of cardiac silhouette post CABG. Stable mediastinal contours. Increased LEFT lower lobe consolidation. Slightly increased infiltrate at RIGHT base as well. Minimal central peribronchial thickening. No pleural effusion or pneumothorax. IMPRESSION: Enlargement of cardiac silhouette post CABG. Stable line and tube positions. Increased LEFT lower lobe consolidation with minimally increased infiltrate at RIGHT base as well. Findings are suspicious for pneumonia involving the LEFT lower lobe though a component of coexisting pulmonary edema may be present. Electronically Signed   By: Lavonia Dana M.D.   On: 11/08/2015 12:47   Dg Chest Port 1 View  11/06/2015  CLINICAL DATA:  Acute CHF secondary to acute MI, complete heart block. EXAM: PORTABLE CHEST 1 VIEW COMPARISON:  Portable chest x-ray of November 05, 2015 FINDINGS: The lungs are adequately inflated. The pulmonary interstitial markings are less prominent on the right today. They have improved on the left as well and the hemidiaphragm is now visible. There is persistent interstitial edema on the left. The cardiac  silhouette remains enlarged. The intraaortic balloon pump marker tip projects over the proximal portion of descending thoracic aorta and appears stable. The endotracheal tube tip projects 5.5 cm above the carina. The nasogastric and Doppler AH feeding tubes have their tips below the inferior margin of the image. The right-sided PICC line tip projects over the junction of the middle and distal thirds of the SVC. The left subclavian venous catheter tip projects over the midportion of the SVC. IMPRESSION: Decreasing pulmonary edema consistent with improvement in CHF. Persistent perihilar interstitial edema on the left. The support tubes and devices are in reasonable position. Electronically Signed   By: David  Martinique M.D.   On: 11/06/2015 07:32   Dg Chest Port 1 View  11/05/2015  CLINICAL DATA:  Respiratory failure. EXAM: PORTABLE CHEST 1 VIEW COMPARISON:  11/04/2015 . FINDINGS: Endotracheal tube, NG tube, right PICC line stable position. Prior CABG. Cardiomegaly. Persistent left mid and lower lung field infiltrate. Persistent mild atelectatic changes right upper and right lower lobes. No pneumothorax. IMPRESSION: 1.  Lines and tubes in stable position. 2. Persistent left mid and left lower lung infiltrate, no interim change. Persistent right upper lobe and right lower lobe subsegmental atelectasis. No interim change. 3. Prior CABG.  Stable cardiomegaly. Electronically Signed   By: Marcello Moores  Register   On: 11/05/2015 07:48   Dg Chest Port 1 View  11/04/2015  CLINICAL DATA:  Hypoxia EXAM: PORTABLE CHEST 1 VIEW COMPARISON:  November 04, 2015 FINDINGS: Endotracheal tube tip is 5.3 cm above the carina. Central catheter tip is in the superior vena cava near the cavoatrial junction. Nasogastric tube tip and side port are below the diaphragm. There also is a feeding tube with the tip below the diaphragm. No pneumothorax. There has been removal of a left chest tube. There is airspace consolidation throughout the left mid and  lower lung zones, stable. There is mild atelectasis in the right upper lobe, stable. No new opacity is evident. There is stable cardiomegaly. The pulmonary  vascularity is normal. No adenopathy is evident. IMPRESSION: Tube and catheter positions as described without pneumothorax. Airspace consolidation throughout the left mid and lower lung zones, stable. Atelectasis right upper lobe, stable. No new opacity. Stable cardiomegaly. Electronically Signed   By: Lowella Grip III M.D.   On: 11/04/2015 14:52   Dg Chest Port 1 View  11/04/2015  CLINICAL DATA:  Status post CABG 10/28/2015. EXAM: PORTABLE CHEST 1 VIEW COMPARISON:  Single view of the chest 11/03/2015 and 11/02/2015. FINDINGS: Support tubes and lines including a left chest tube are unchanged since the most recent exam. The chest is better expanded today with decreased atelectasis. Airspace opacity in the left mid and lower lung zones persists. There is likely a left pleural effusion. Marked cardiomegaly is seen with only mild vascular congestion noted. IMPRESSION: Increased pulmonary expansion with decreased scattered atelectasis. Left mid and lower lung zone airspace disease could be due to atelectasis or pneumonia. Small left pleural effusion noted. Support tubes and lines projecting good position.  No pneumothorax. Electronically Signed   By: Inge Rise M.D.   On: 11/04/2015 07:30   Dg Chest Port 1 View  11/03/2015  CLINICAL DATA:  Central line placement. EXAM: PORTABLE CHEST 1 VIEW COMPARISON:  11/03/2015. FINDINGS: Left subclavian central line noted with tip at the cavoatrial junction. Interval removal of right chest tube. Endotracheal tube, NG tube, right PICC line, left IJ sheath left chest tube in stable position. No pneumothorax. Prior CABG. Stable cardiomegaly. Low lung volumes. A mild component of congestive heart failure cannot be excluded. Similar findings noted on prior exam P IMPRESSION: 1. Interim placement of left subclavian  central line, its tip is at the cavoatrial junction. Interim removal right chest tube . No pneumothorax. 2. Remaining lines and tubes including left chest tube in stable position. 3. Prior CABG. Stable cardiomegaly. Low lung volumes. A a mild component congestive heart failure cannot be excluded . Similar findings noted on prior exam. Electronically Signed   By: [place]   On: 11/03/2015 09:58   Dg Chest Port 1 View  11/03/2015  CLINICAL DATA:  Status post CABG on October 27, 2015 persistent left lower lobe atelectasis or pneumonia. No pneumothorax or significant pleural effusion. EXAM: PORTABLE CHEST 1 VIEW COMPARISON:  Portable chest x-ray of November 02, 2015 at 5:48 a.m. FINDINGS: There has been interval placement of an intra aortic balloon pump whose marker lies over the junction of the aortic arch with the proximal descending thoracic aorta. The cardiac silhouette remains enlarged. The pulmonary vascularity remains mildly prominent. The retrocardiac region on the left remains dense. Bilateral chest tubes are in stable position. There is no pneumothorax nor significant pleural effusion. The endotracheal tube tip lies approximately 6.5 cm above the carina. The esophagogastric tube tip projects below the inferior margin of the image. The right-sided PICC line tip projects over the midportion of the SVC. A previously demonstrated pericardial drain is not clearly evident on today's study. IMPRESSION: Mild CHF. Persistent left lower lobe atelectasis or pneumonia. Interval placement of an intra aortic balloon pump which appears to be in appropriate position radiographically. The other support tubes and lines are in reasonable position. Electronically Signed   By: David  Martinique M.D.   On: 11/03/2015 07:27   Dg Chest Port 1 View  11/02/2015  CLINICAL DATA:  CABG. EXAM: PORTABLE CHEST 1 VIEW COMPARISON:  11/01/2015. FINDINGS: Endotracheal tube, NG tube, right PICC line, bilateral chest tubes in stable position.  Prior CABG. Stable cardiomegaly .  Persistent mild interstitial prominence, congestive heart failure cannot be excluded. No interim change. Low lung volumes with basilar atelectasis . IMPRESSION: 1. Endotracheal tube, NG tube, right PICC line, bilateral chest tubes in stable position . No pneumothorax. 2. Prior CABG. Cardiomegaly with pulmonary interstitial prominence and small left pleural effusion consistent with mild congestive heart failure. No interim change from prior exam . Electronically Signed   By: Marcello Moores  Register   On: 11/02/2015 07:20   Dg Chest Port 1 View  11/01/2015  CLINICAL DATA:  Post CABG EXAM: PORTABLE CHEST 1 VIEW COMPARISON:  10/31/2015 FINDINGS: Cardiomediastinal silhouette is stable. Status post CABG. Stable endotracheal and NG tube position. Left chest tube is unchanged in position. Persistent central vascular congestion and mild interstitial prominence bilaterally suspicious for the mild interstitial edema. Left IJ sheath in place. Probable small left pleural effusion left basilar atelectasis or infiltrate. Right arm PICC line is unchanged in position. There is no pneumothorax. IMPRESSION: Status post CABG. Stable support apparatus. Stable left chest tube position. No pneumothorax. Again noted bilateral mild interstitial prominence and perihilar opacities suspicious for mild pulmonary edema. Probable small left pleural effusion left basilar atelectasis or infiltrate. Electronically Signed   By: Lahoma Crocker M.D.   On: 11/01/2015 09:57   Dg Chest Port 1 View  10/31/2015  CLINICAL DATA:  55 year old male with a history of endotracheal tube placement Patient has undergone coronary artery bypass grafting x4 10/27/2015. Placement of intra aortic balloon pump at this date. Ischemic cardiomyopathy. EXAM: PORTABLE CHEST 1 VIEW COMPARISON:  10/30/2015. FINDINGS: Endotracheal tube terminates approximately 3.4 cm above the carina, unchanged. Gastric tube terminates out of the field of view.  Defibrillator pads project over the left and right chest. Bilateral thoracostomy tubes. Right upper extremity PICC. The mediastinal drain not visualized. Intra-aortic balloon pump not visualized. Surgical changes of median sternotomy and CABG. Mixed bilateral interstitial and airspace opacities. Low lung volumes. IMPRESSION: Low lung volumes with mixed bilateral interstitial and airspace opacities, similar to the comparison chest x-ray. Retrocardiac opacity may reflect persistent pleural fluid, and/or atelectasis. Surgical support apparatus appear unchanged, as above. Intra aortic balloon pump not visualized. Signed, Dulcy Fanny. Earleen Newport, DO [facility] Electronically Signed   By: Corrie Mckusick D.O.   On: 10/31/2015 07:25   Dg Chest Port 1 View  10/30/2015  CLINICAL DATA:  Post cavity.  Recent CABG. EXAM: PORTABLE CHEST 1 VIEW COMPARISON:  Portable film earlier today. FINDINGS: Overlying support apparatus redemonstrated. Tubes and lines remain stable. Elevated LEFT hemidiaphragm. No definite pneumothorax. Moderate vascular congestion may be increased. Cardiomegaly. IMPRESSION: Slight worsening aeration. Moderate vascular congestion may be increased. Electronically Signed   By: Staci Righter M.D.   On: 10/30/2015 18:39   Dg Chest Port 1 View  10/30/2015  CLINICAL DATA:  Status post coronary bypass grafting EXAM: PORTABLE CHEST 1 VIEW COMPARISON:  10/30/2015 FINDINGS: Cardiac shadow remains enlarged. Bilateral chest tubes are again seen. A right-sided PICC line and endotracheal tube are noted in satisfactory position. Left jugular sheath is noted. Elevation of left hemidiaphragm is noted with left basilar atelectasis. No pneumothorax is noted. IMPRESSION: Postsurgical change with tubes and lines as described. Mild left basilar atelectasis. Electronically Signed   By: Inez Catalina M.D.   On: 10/30/2015 07:45   Dg Chest Port 1 View  10/30/2015  CLINICAL  DATA:  A chest tube in place EXAM: PORTABLE CHEST 1 VIEW COMPARISON:  10/30/2015 FINDINGS: Postoperative changes in the mediastinum. Endotracheal tube with  tip measuring 6.3 cm about the carina. Right PICC catheter with tip over the cavoatrial junction. Left central venous catheter sheath with tip over the left neck consistent location in the internal jugular vein. Bilateral chest tubes. Infiltration or atelectasis in the right lung base. Cardiac enlargement. No significant vascular congestion. No pneumothorax. IMPRESSION: Appliance positioned as described. Cardiac enlargement with infiltration or atelectasis in the right lung base. Bilateral chest tubes are present but no visualized pneumothorax. Electronically Signed   By: Burman Nieves M.D.   On: 10/30/2015 03:07   Dg Chest Port 1 View  10/30/2015  CLINICAL DATA:  Shortness of breath.  Code blue. EXAM: PORTABLE CHEST 1 VIEW COMPARISON:  10/29/2015 FINDINGS: Postoperative changes in the mediastinum. Bilateral chest tubes are present. Right PICC line with tip over the cavoatrial junction. Left central venous catheter or sheath with tip over the left side of the neck, likely in the left internal jugular vein. Cardiac enlargement without significant vascular congestion. Shallow inspiration with elevation of the left hemidiaphragm. Probable left pleural effusion. No definite pulmonary consolidation. IMPRESSION: Shallow inspiration. Probable left pleural effusion. Cardiac enlargement. Appliances appear in satisfactory position. Electronically Signed   By: Burman Nieves M.D.   On: 10/30/2015 01:18   Dg Chest Port 1 View  10/29/2015  CLINICAL DATA:  Postop CABG 2 days ago. EXAM: PORTABLE CHEST 1 VIEW COMPARISON:  Portable chest x-ray of October 28, 2015 FINDINGS: The trachea and esophagus have been extubated. The cardiac silhouette remains enlarged. The pulmonary vascularity is mildly engorged. The bilateral chest tubes and the mediastinal drain are in stable  position. There is no pneumothorax or large pleural effusion. The Swan-Ganz catheter tip overlies a proximal right lower lobe pulmonary artery branch. The mediastinum is widened and accentuated by the hypo inflation. IMPRESSION: Bilateral hypoinflation following extubation of the trachea there is crowding of the pulmonary vascularity. Left basilar atelectasis or less likely pneumonia is more prominent today. Probable small left pleural effusion. The remaining support tubes and lines are in stable position. Electronically Signed   By: David  [place] M.D.   On: 10/29/2015 07:40   Dg Chest Port 1 View  10/28/2015  CLINICAL DATA:  Post CABG, on ventilator, followup portable chest x-ray of 10/28/2015 EXAM: PORTABLE CHEST 1 VIEW COMPARISON:  None. FINDINGS: The tip of the endotracheal tube is approximately 5.2 cm above the carina. Aeration has improved slightly. Atelectasis remains at the lung bases left-greater-than-right. Swan-Ganz catheter tip is in the right lower lobe pulmonary artery and bilateral chest tubes remain. No pneumothorax is seen. Heart size is stable. IMPRESSION: 1. Improved aeration. 2. Bilateral chest tubes.  No pneumothorax. 3. Tip of endotracheal tube approximately 5.2 cm above the carina. Electronically Signed   By: Dwyane Dee M.D.   On: 10/28/2015 08:01   Dg Chest Port 1 View  10/27/2015  CLINICAL DATA:  Status post CABG EXAM: PORTABLE CHEST 1 VIEW COMPARISON:  10/27/2015 FINDINGS: Unchanged tracheostomy tube. Swan-Ganz central venous line is now seen with tip advanced into the right perihilar region. Stable right chest tube with and left chest tube. NG tube again crosses the gastroesophageal junction. Stable mediastinal drain. Enlarged cardiac silhouette. Limited inspiratory effect with bibasilar opacities likely representing atelectasis. IMPRESSION: Anticipated postoperative appearance with bibasilar atelectasis. Electronically Signed   By: Esperanza Heir M.D.   On: 10/27/2015 19:25   Dg  Chest Portable 1 View  10/27/2015  CLINICAL DATA:  Status post coronary bypass grafting EXAM: PORTABLE CHEST 1 VIEW COMPARISON:  10/23/2015 FINDINGS:  Cardiac shadow is mildly enlarged. A Swan-Ganz catheter is noted in the right pulmonary outflow tract. Bilateral thoracostomy catheters are seen. No pneumothorax is noted. An endotracheal tube is noted in satisfactory position. Intra-aortic balloon pump is noted over the aortic arch. Mild left basilar atelectasis is seen. Mild central vascular congestion is noted. IMPRESSION: Postoperative changes with tubes and lines as described above. Mild vascular congestion and left basilar atelectasis. Electronically Signed   By: Inez Catalina M.D.   On: 10/27/2015 17:23   Portable Chest X-ray 1 View  10/21/2015  CLINICAL DATA:  55 year old male with a history of acute systolic congestive heart failure EXAM: PORTABLE CHEST 1 VIEW COMPARISON:  10/20/2015 FINDINGS: Heart size unchanged, enlarged. Similar appearance of low lung volumes with interstitial opacities, interlobular septal thickening, and developing airspace disease of the bilateral lower lungs. No pneumothorax. Linear opacity overlying the mediastinum and the heart border, of on certain significance. Favored to overlie the patient. IMPRESSION: Evidence of worsening congestive heart failure, with increasing edema. New linear opacity overlies the mediastinum, favored to be overlying the patient, though cannot be localized on this view. Signed, Dulcy Fanny. Earleen Newport, DO [facility] Electronically Signed   By: Corrie Mckusick D.O.   On: 10/21/2015 07:16   Dg Chest Portable 1 View  10/20/2015  CLINICAL DATA:  Shortness of breath. EXAM: PORTABLE CHEST 1 VIEW COMPARISON:  None. FINDINGS: Midline trachea. Cardiomegaly accentuated by AP portable technique. No pleural effusion or pneumothorax. Mildly low lung volumes with bibasilar atelectasis. IMPRESSION: Mild cardiomegaly  and low lung volumes.  No acute findings. Electronically Signed   By: Abigail Miyamoto M.D.   On: 10/20/2015 18:09   Dg Abd Portable 1v  11/02/2015  CLINICAL DATA:  Assess feeding tube positioning EXAM: PORTABLE ABDOMEN - 1 VIEW COMPARISON:  None in PACs FINDINGS: The repeated KUB with has less motion artifact. The tip of the feeding tube and nasogastric tube lie in the region of the distal gastric body in the pre-pyloric region. The bowel gas pattern is within the limits of normal. Numerous tubes and lines and electrodes overlie the abdomen. IMPRESSION: The tips of the feeding tube and the esophagogastric tube lie in the region of the distal gastric body in the pre-pyloric region. Electronically Signed   By: David  Martinique M.D.   On: 11/02/2015 12:16   Dg Addison Bailey G Tube Plc W/fl-no Rad  11/03/2015  CLINICAL DATA:  NASO G TUBE PLACEMENT WITH FLUORO Fluoroscopy was utilized by the requesting physician.  No radiographic interpretation.    PHYSICAL EXAM General: Awake on vent Neck: JVP hard to see.  no thyromegaly or thyroid nodule.  Lungs: Decreased breath sounds at bases bilaterally.  CV: Nondisplaced PMI.  Heart regular S1/S2, no S3/S4, no murmur. Trace ankle edema. Abdomen: Soft, nontender, no hepatosplenomegaly, no distention.  Neurologic: Alert and follows commands.  Psych: Normal affect. Extremities: No clubbing or cyanosis. Right groin IABP site stable. Dopplerable pedal pulses. Maculopapular rash on his sides  TELEMETRY: Reviewed telemetry, NSR with PVCs  ASSESSMENT AND PLAN:  1. CAD: S/p late presentation inferior MI and CABG x 4. Continue statin, ASA. Coronary angiography 3/27 with multiple VT episodes showed patent LIMA-LAD and SVG-RCA.  Patent SVG-ramus and SVG-D but slow flow in both grafts with diffusely diseased small target vessels.  2. Cardiogenic shock: Acute systolic CHF with RV failure post inferior MI with suspected RV involvement. EF 25-30% on echo (difficult images on 3/24  echo). He has significant RV  failure from suspected RV infarct.  3/27 IABP placed to try to allow decrease in milrinone with frequent VT.  3/31 echo with EF 30-35%.  IABP out 4/2.  Good co-ox this morning at 72%. CVP lower at 14 with good UOP. - With fall in CVP and ongoing BUN rise, stop metolazone and Lasix today, allow re-equilibration.    - He is now off NO and on sildenafil 40 tid.   - Good BP and co-ox, can decrease milrinone to 0.125. - Increase hydralazine to 50 mg tid for afterload reduction (no Imdur with sildenafil use).   3. Complete heart block: Pacemaker turned off 4/3.  Underlying NSR in 70s.  4. VF arrest 3/24 early am and again at night 3/24.  Only occasional PVCs today.  - Continue amiodarone.  5. AKI: Creatinine lower today but BUN higher.  Suspect ATN. Baseline creatinine 1.0. Renal following, given good UOP and apparent lack of uremic symptoms, no RRT yet.  Holding diuretics today to allow equilibration. 6. ID: Possible LLL PNA.  He is on vancomycin/levofloxacin.  7. Anemia: No overt bleeding.  Got 2 units PRBCs on 4/4.  8. Pulmonary: Pulmonary edema, LLL PNA.  Failed SBT yesterday.  May need trach.   The patient is critically ill with multiple organ systems failure and requires high complexity decision making for assessment and support, frequent evaluation and titration of therapies, application of advanced monitoring technologies and extensive interpretation of multiple databases.   Critical Care Time devoted to patient care services described in this note is 35 Minutes.  Loralie Champagne 11/11/2015 8:18 AM

## 2015-11-11 NOTE — Progress Notes (Signed)
9 Days Post-Op Procedure(s) (LRB): Right/Left Heart Cath and Coronary/Graft Angiography (N/A) Intra-Aortic Balloon Pump Insertion Subjective: Patient hemodynamically stable despite atrial fibrillation, co-OX 70% Excellent diuresis yesterday, CVP 15 Although chest x-ray with only mild edema patient did not tolerate pressure support ventilator weaning because of tachypnea. We'll plan on percutaneous tracheostomy to optimize vent weaning and reduce airway complications. Hemoglobin stable after transfusion 2 units yesterday Low-dose heparin still on hold Lasix drip on hold per renal because of continued rise in azotemia with satisfactory urine output  Objective: Vital signs in last 24 hours: Temp:  [97.9 F (36.6 C)-98.9 F (37.2 C)] 98.9 F (37.2 C) (04/05 0730) Pulse Rate:  [69-87] 87 (04/05 0804) Cardiac Rhythm:  [-] Atrial fibrillation (04/05 0400) Resp:  [12-23] 17 (04/05 0804) BP: (101-135)/(62-85) 101/63 mmHg (04/05 0804) SpO2:  [90 %-99 %] 96 % (04/05 0804) Arterial Line BP: (123-182)/(48-92) 123/54 mmHg (04/05 0700) FiO2 (%):  [40 %-50 %] 50 % (04/05 0804) Weight:  [236 lb 12.4 oz (107.4 kg)] 236 lb 12.4 oz (107.4 kg) (04/05 0200)  Hemodynamic parameters for last 24 hours: CVP:  [14 mmHg-22 mmHg] 14 mmHg  Intake/Output from previous day: 04/04 0701 - 04/05 0700 In: 3552.6 [I.V.:1362.6; Blood:700; NG/GT:1490] Out: 7025 [Urine:6175; Emesis/NG output:550; Stool:300] Intake/Output this shift:    Alert on vent Lungs clear Extremities warm well perfused Abdomen soft Atrial fibrillation without murmur   Lab Results:  Recent Labs  11/10/15 0330 11/10/15 1519 11/11/15 0320  WBC 12.0*  --  12.8*  HGB 7.6* 11.6* 10.3*  HCT 24.5* 34.0* 33.9*  PLT 313  --  352   BMET:  Recent Labs  11/10/15 0330 11/10/15 1519 11/11/15 0320  NA 141 144 150*  K 3.6 3.7 3.1*  CL 94* 96* 95*  CO2 31  --  37*  GLUCOSE 140* 143* 116*  BUN 156* >140* 164*  CREATININE 3.60* 3.60*  3.33*  CALCIUM 8.7*  --  9.6    PT/INR: No results for input(s): LABPROT, INR in the last 72 hours. ABG    Component Value Date/Time   PHART 7.485* 11/11/2015 0323   HCO3 41.2* 11/11/2015 0323   TCO2 43 11/11/2015 0323   ACIDBASEDEF TEST WILL BE CREDITED 11/01/2015 0446   O2SAT 72.2 11/11/2015 0340   CBG (last 3)   Recent Labs  11/10/15 1918 11/10/15 2330 11/11/15 0410  GLUCAP 121* 151* 105*    Assessment/Plan: S/P Procedure(s) (LRB): Right/Left Heart Cath and Coronary/Graft Angiography (N/A) Intra-Aortic Balloon Pump Insertion We'll speak with CCM about percutaneous tracheostomy tomorrow Hold Lasix drip Amiodarone bolus once today Continue vancomycin-Levaquin DC radial A-line, now 47 days old   LOS: 22 days    Kathlee Nations Trigt III 11/11/2015

## 2015-11-11 NOTE — Progress Notes (Signed)
S: Denies nausea O:BP 101/63 mmHg  Pulse 77  Temp(Src) 97.9 F (36.6 C) (Oral)  Resp 14  Ht 5' 10" (1.778 m)  Wt 107.4 kg (236 lb 12.4 oz)  BMI 33.97 kg/m2  SpO2 95%  Intake/Output Summary (Last 24 hours) at 11/11/15 0720 Last data filed at 11/11/15 0700  Gross per 24 hour  Intake 3552.6 ml  Output   7025 ml  Net -3472.4 ml   Weight change: -7.4 kg (-16 lb 5 oz) Gen:awake and alert, intubated CVS:Irreg,irreg Resp: Clear ant Abd:+ BS NTND Ext:Tr edema on Rt. Rt PICC NEURO:CNI  Follows commands  + asterixis Skin: Rash under both arms extending to back Lt IJ triple lumen   . amiodarone  150 mg Intravenous Once  . antiseptic oral rinse  7 mL Mouth Rinse QID  . aspirin  325 mg Per Tube Daily  . atorvastatin  80 mg Oral q1800  . bisacodyl  10 mg Oral Daily   Or  . bisacodyl  10 mg Rectal Daily  . chlorhexidine gluconate (SAGE KIT)  15 mL Mouth Rinse BID  . feeding supplement (PRO-STAT SUGAR FREE 64)  30 mL Per Tube TID  . fentaNYL (SUBLIMAZE) injection  25 mcg Intravenous Once  . hydrALAZINE  25 mg Oral 3 times per day  . insulin aspart  0-24 Units Subcutaneous 6 times per day  . insulin detemir  12 Units Subcutaneous BID  . levalbuterol  1.25 mg Nebulization Q6H WA  . levofloxacin (LEVAQUIN) IV  750 mg Intravenous Q48H  . metolazone  5 mg Oral Daily  . midazolam  1 mg Intravenous Once  . pantoprazole (PROTONIX) IV  40 mg Intravenous Daily  . potassium chloride  10 mEq Intravenous Q1 Hr x 2  . potassium chloride  40 mEq Oral 3 times per day  . QUEtiapine  50 mg Oral QHS  . sennosides  5 mL Per Tube BID  . sildenafil  40 mg Oral TID  . silver sulfADIAZINE   Topical BID  . sodium chloride flush  10-40 mL Intracatheter Q12H  . triamcinolone cream   Topical BID  . vancomycin  1,250 mg Intravenous Q48H   Dg Chest Port 1 View  11/11/2015  CLINICAL DATA:  Respiratory failure. EXAM: PORTABLE CHEST 1 VIEW COMPARISON:  11/10/2015. FINDINGS: Endotracheal tube, feeding tube,  bilateral subclavian lines in stable position. Prior CABG. Cardiomegaly with diffuse bilateral pulmonary infiltrates consistent with pulmonary edema. Worsening from prior exam. Small left pleural effusion. No pneumothorax. IMPRESSION: 1.  Lines and tubes in stable position. 2. Prior CABG. Cardiomegaly with worsening bilateral pulmonary edema. Small left pleural effusion. Electronically Signed   By: Thomas  Register   On: 11/11/2015 07:17   Dg Chest Port 1 View  11/10/2015  CLINICAL DATA:  Shortness of breath . EXAM: PORTABLE CHEST 1 VIEW COMPARISON:  11/09/2015. FINDINGS: Endotracheal tube, NG tube, right PICC line, left subclavian line in stable position. Prior CABG. Cardiomegaly. Left lower lobe atelectasis and/or infiltrate with small left pleural effusion. Mild right base atelectasis and or infiltrate. No pneumothorax . IMPRESSION: 1.  Lines and tubes in stable position. 2. Left lower lobe atelectasis and/or infiltrate with small left pleural effusion. Mild right base atelectasis and/or infiltrate. Findings stable from prior exam. 3.  Prior CABG.  Stable cardiomegaly. Electronically Signed   By: Thomas  Register   On: 11/10/2015 07:51   BMET    Component Value Date/Time   NA 150* 11/11/2015 0320   K 3.1* 11/11/2015 0320     CL 95* 11/11/2015 0320   CO2 37* 11/11/2015 0320   GLUCOSE 116* 11/11/2015 0320   BUN 164* 11/11/2015 0320   CREATININE 3.33* 11/11/2015 0320   CALCIUM 9.6 11/11/2015 0320   GFRNONAA 19* 11/11/2015 0320   GFRAA 23* 11/11/2015 0320   CBC    Component Value Date/Time   WBC 12.8* 11/11/2015 0320   RBC 3.73* 11/11/2015 0320   HGB 10.3* 11/11/2015 0320   HCT 33.9* 11/11/2015 0320   PLT 352 11/11/2015 0320   MCV 90.9 11/11/2015 0320   MCH 27.6 11/11/2015 0320   MCHC 30.4 11/11/2015 0320   RDW 17.9* 11/11/2015 0320     Assessment:  1. ARF most likely due to ATN, Scr trending down but BUN higher due to steroids and some intravascular volume depletion 2. SP CABG  complicated by VT 3. Hx HTN 4. ? Allergic rxn 5. Hypernatremia  Plan: 1.  Hold diuretics for now and let intravascular volume re equilabrate 2. Daily labs 3. Empirically decrease KCL since diuretics will be held  , T 

## 2015-11-11 NOTE — Progress Notes (Signed)
eLink Physician-Brief Progress Note Patient Name: Christopher [name] DOB: 13-Oct-1960 MRN: [id number]   Date of Service  11/11/2015  HPI/Events of Note    eICU Interventions  Hypokalemia -repleted      Intervention Category Intermediate Interventions: Electrolyte abnormality - evaluation and management  Alphonso Gregson V. 11/11/2015, 6:57 PM

## 2015-11-11 NOTE — Progress Notes (Addendum)
Nutrition Follow Up  DOCUMENTATION CODES:   Obesity unspecified  INTERVENTION:   Continue Vital High Protein formula at goal rate of 50 ml/hr with Prostat liquid protein 30 ml TID  Total TF regimen providing 1500 kcals, 150 gm protein, 1003 ml of free water  NUTRITION DIAGNOSIS:   Inadequate oral intake related to inability to eat as evidenced by NPO status, ongoing  GOAL:   Provide needs based on ASPEN/SCCM guidelines, met  MONITOR:   Vent status, Labs, Weight trends, Skin, I & O's  ASSESSMENT:   Pt with hx of HTN admitted 3/14 at [facility] with SOB. Pt with inferior STEMI and in complete heart block s/p heart cath on admission.   Pt had CABG x 4 3/22. Vib arrest, defibrillated x 4. Extubated post-op and re-intubated 3/24.  Patient is currently intubated on ventilator support MV: 9.1 L/min Temp (24hrs), Avg:98.2 F (36.8 C), Min:97.9 F (36.6 C), Max:98.9 F (37.2 C)   Vital High Protein formula continue to infuse at 50 ml/hr with Prostat liquid protein 30 ml TID via small bore feeding tube providing 1500 kcals, 150 gm protein, 1003 ml of free water.  CCM note reviewed.  Plan is for trach placement soon.  Diet Order:  Diet NPO time specified  Skin:  Reviewed, no issues  Last BM:  4/5  Height:   Ht Readings from Last 1 Encounters:  10/31/15 5' 10"  (1.778 m)    Weight:   Wt Readings from Last 1 Encounters:  11/11/15 236 lb 12.4 oz (107.4 kg)  Admission weight: 247 lb (112.1 kg)  Ideal Body Weight:  75.4 kg  BMI:  Body mass index is 33.97 kg/(m^2).  Estimated Nutritional Needs:   Kcal:  1025-8527  Protein:  150-160 gm  Fluid:  per MD  EDUCATION NEEDS:   No education needs identified at this time  Arthur Holms, RD, LDN Pager #: [phone] After-Hours Pager #: [phone]

## 2015-11-11 NOTE — Procedures (Signed)
Procedure done by P Babcock ACNP-BC. At first bronch was introduce through ET tube and structures of tracheal rings, carina identified for operator of tracheostomy who was Dr Molli Knock. Light of bronch passed through trachea and skin for indentification of tracheal rings for tracheostomy puncture. After this, under bronchoscopy guidance,  ET tube was pulled back sufficiently and very carefully. The ET tube was  pulled back enough to give room for tracheostomy operator and yet at same time to to ensure a secured airway. After this was accomplished, bronchoscope was withdrawn into the ET tube. After this,  Dr Molli Knock then performed tracheostomy under video visual provided by flexible video bronchoscopy. Followng introduction of tracheostomy,  the bronchoscope was removed from ET tube and introduced through tracheostomy. Correct position of tracheostomy was ensured, with enough room between carina and distal tracheostomy and no evidence of bleeding. The bronchoscope was then withdrawn. Respiratory therapist was then instructed to remove the ET tube.  Dr Molli Knock then proceeded to complete the tracheostomy with stay sutures   No complications   Simonne Martinet ACNP-BC [facility]/Critical Care Pager # [phone] OR # [phone] if no answer  Alyson Reedy, M.D. [facility] Pulmonary/Critical Care Medicine. Pager: [phone]. After hours pager: [phone].

## 2015-11-11 NOTE — Progress Notes (Signed)
PULMONARY / CRITICAL CARE MEDICINE   Name: Christopher [name] MRN: [id number] DOB: 1960-12-18    ADMISSION DATE:  10/20/2015 CONSULTATION DATE:  11/03/2015  REFERRING MD: Dr. Donata Clay / CVTS  CHIEF COMPLAINT:  Acute Hypoxic Respiratory Failure  HISTORY OF PRESENT ILLNESS:   [name] w/ ischemic cardiomyopathy following MI. Patient underwent CABG & CEA. Post-operative course complicated by arrhythmia and biventricular failure. Continues to have IABP in place. Requiring NO inhaled on ventilator. Continuing on Primacor and diuretics.   SUBJECTIVE:  Has tolerated d/c NO Bp stable on milrinone Has had good UOP, lasix gtt on hold Was tachypneic on PSV so back to [facility]  VITAL SIGNS: BP 124/72 mmHg  Pulse 84  Temp(Src) 98.9 F (37.2 C) (Oral)  Resp 17  Ht 5\' 10"  (1.778 m)  Wt 107.4 kg (236 lb 12.4 oz)  BMI 33.97 kg/m2  SpO2 94%  HEMODYNAMICS: CVP:  [14 mmHg-22 mmHg] 14 mmHg  VENTILATOR SETTINGS: Vent Mode:  [-] PRVC FiO2 (%):  [40 %-50 %] 50 % Set Rate:  [14 bmp] 14 bmp Vt Set:  [650 mL] 650 mL PEEP:  [5 cmH20] 5 cmH20 Plateau Pressure:  [19 cmH20-27 cmH20] 20 cmH20  INTAKE / OUTPUT: I/O last 3 completed shifts: In: 5098 [I.V.:1998; Blood:700; NG/GT:1950; IV Piggyback:450] Out: 9625 [Urine:8275; Emesis/NG output:850; Stool:500]  PHYSICAL EXAMINATION: General:  Comfortable. Sedated. Wife at bedside. Neuro:  Sedated.  Pupils equal.  Easily arousable and interactive. HEENT:  ETT in place.  No scleral icterus or injection. Cardiovascular:  Irregular, no M  Lungs:  Coarse breath sounds bilaterally. Symmetric chest rise on ventilator. Abdomen:  Soft. Protuberant. Normal BS. Integument:  Warm & dry. No rash on exposed skin.  LABS:  BMET  Recent Labs Lab 11/09/15 0345  11/10/15 0330 11/10/15 1519 11/11/15 0320  NA 142  < > 141 144 150*  K 4.0  < > 3.6 3.7 3.1*  CL 96*  < > 94* 96* 95*  CO2 31  --  31  --  37*  BUN 140*  < > 156* >140* 164*  CREATININE 3.83*   < > 3.60* 3.60* 3.33*  GLUCOSE 178*  < > 140* 143* 116*  < > = values in this interval not displayed. Electrolytes  Recent Labs Lab 11/09/15 0345 11/10/15 0330 11/11/15 0320  CALCIUM 9.2 8.7* 9.6  MG 3.0* 2.8* 2.8*  PHOS 6.7* 5.9* 5.9*  5.7*   CBC  Recent Labs Lab 11/09/15 0345  11/10/15 0330 11/10/15 1519 11/11/15 0320  WBC 12.3*  --  12.0*  --  12.8*  HGB 8.9*  < > 7.6* 11.6* 10.3*  HCT 28.0*  < > 24.5* 34.0* 33.9*  PLT 309  --  313  --  352  < > = values in this interval not displayed. Coag's No results for input(s): APTT, INR in the last 168 hours.  Sepsis Markers  Recent Labs Lab 11/05/15 0455  PROCALCITON 1.17   ABG  Recent Labs Lab 11/09/15 0322 11/10/15 0332 11/11/15 0323  PHART 7.421 7.444 7.485*  PCO2ART 49.1* 49.0* 54.7*  PO2ART 62.1* 71.0* 68.0*    Liver Enzymes  Recent Labs Lab 11/09/15 0345 11/10/15 0330 11/10/15 1040 11/11/15 0320  AST 49* 47* 50*  --   ALT 52 52 56  --   ALKPHOS 89 75 79  --   BILITOT 1.1 0.6 1.0  --   ALBUMIN 2.7* 2.5* 2.8* 2.8*    Cardiac Enzymes No results for input(s): TROPONINI, PROBNP in  the last 168 hours.  Glucose  Recent Labs Lab 11/10/15 1111 11/10/15 1510 11/10/15 1918 11/10/15 2330 11/11/15 0410 11/11/15 0727  GLUCAP 164* 129* 121* 151* 105* 111*   Imaging Dg Chest Port 1 View  11/11/2015  CLINICAL DATA:  Respiratory failure. EXAM: PORTABLE CHEST 1 VIEW COMPARISON:  11/10/2015. FINDINGS: Endotracheal tube, feeding tube, bilateral subclavian lines in stable position. Prior CABG. Cardiomegaly with diffuse bilateral pulmonary infiltrates consistent with pulmonary edema. Worsening from prior exam. Small left pleural effusion. No pneumothorax. IMPRESSION: 1.  Lines and tubes in stable position. 2. Prior CABG. Cardiomegaly with worsening bilateral pulmonary edema. Small left pleural effusion. Electronically Signed   By: Maisie Fus  Register   On: 11/11/2015 07:17    STUDIES:  Port CXR 3/28:   Previously reviewed by me. R PICC in good psition. ETT >5cm above carina. New L [place] CVL. L lung opacity unchanged. Low lung volumes. L chest tube in place. Port CXR 3/30:  Personally reviewed by me.  MICROBIOLOGY: Tracheal Asp Ctx 3/27:  Oral Flora Urine Ctx 3/24:  Negative MRSA PCR 3/20:  Negative MRSA PCR 3/14:  Negative  ANTIBIOTICS: Zosyn 3/29>>> 4/1 Vancomcyin 3/21 - 3/22; 3/29>>> Cefuroxime 3/21 - 3/23 (periop prophylaxis) Elita Quick 3/27 - 3/29 levaquin 4/1 >>   SIGNIFICANT EVENTS: 3/14 - Admit 3/14 - LHC 3/21 - CABG 3/27 - IABP replaced >> 4/1 3/28 - Chest Tubes removed. RHC & LHC.  LINES/TUBES: OETT 7.5 Extubated 3/22; 3.24>>> L Chest Tube>>> L [place] CVL 3/28>>> L IJ CVL 3/21>>> R PICC 3/23>>> IABP 3/27>>> 4/1 R Femoral Art Sheath 3/27>>> L Radial Art Line 3/21>>>  ASSESSMENT / PLAN:  PULMONARY A: Acute Hypoxic Respiratory Failure Chronic Hypercarbic Respiratory Failure - OSA/OHS vs COPD.   P:   Full Vent Support, weaning PEEP and Fio2 as able Continue Xopenex q6hr SBT as able but he has tolerated poorly. Discussed with Dr Maren Beach and w family today. We agree that tracheostomy as a tool to allow him to progress to SBT's makes sense. I will review case with Dr Molli Knock and arrange for trach soon   CARDIOVASCULAR A:  S/P CABG & CEA Left Ventricular Heart Failure post MI Cardiogenic Shock Periop Complete Heart Block & Post-op V Tach H/O HTN  P:  CVTS & Cardiology Managing ASA VT Amiodarone gtt Milrinone gtt Lasix gtt held Heparin gtt on hold  Levophed off Transitioned from Inh NO to sildenifil , now off NO  RENAL Lab Results  Component Value Date   CREATININE 3.33* 11/11/2015   CREATININE 3.60* 11/10/2015   CREATININE 3.60* 11/10/2015    Recent Labs Lab 11/10/15 0330 11/10/15 1519 11/11/15 0320  K 3.6 3.7 3.1*  A:   Acute Renal Failure, non-oliguric Hypoklalemia Hypernatremia due to aggresive diuresis   P:   Lasix gtt held Trending  UOP with Foley Electrolytes & Renal Function daily Replacing electrolytes as indicated Consider add free water 4/5, will defer to Renal   GASTROINTESTINAL A:   Transaminitis - Likely shock liver/hepatic congestion. Constipation  P:   Continuing TFs Protonix IV daily Dulcolax PR prn Senna bid  HEMATOLOGIC  Recent Labs  11/10/15 1519 11/11/15 0320  HGB 11.6* 10.3*    A:   Anemia - Hgb worsening. No signs of active bleeding. P:  Transfusion PRBC as needed Heparin gtt Trending cell counts daily w/ CBC  INFECTIOUS A:   Possible HCAP - Ctx w/ Oral Flora.  P:   Abx as above; recommend day 8 of 10 empiric abx for most  recent suspected HCAP Plan to re-culture for fever  ENDOCRINE CBG (last 3)   Recent Labs  11/10/15 2330 11/11/15 0410 11/11/15 0727  GLUCAP 151* 105* 111*   A:   Hyperglycemia - BG controlled.  P:   Continue Levemir bid Accu-checks q4hr SSI per algorithm  NEUROLOGIC A:   Sedation on Ventilator Post-op Pain Control  P:   RASS goal: -1 Versed gtt & IV prn Fentanyl gtt & IV prn  FAMILY UPDATES: Significant other updated at bedside 4/04 and 4/5.  Discussed progress, potential role for trach to allow him to advance. She agrees, pt does as well.   Independent CC time 32 minutes   Levy Pupa, MD, PhD 11/11/2015, 9:52 AM [facility] [phone] or if no answer [phone]

## 2015-11-11 NOTE — Progress Notes (Signed)
Patient ID: Christopher [name], male   DOB: 04/24/1961, 55 y.o.   MRN: [id number]   SICU Evening Rounds:   Hemodynamically stable on milrinone.  amio drip. No VT  Had trach today. Site ok He is sedated but alert  Urine output good    CBC    Component Value Date/Time   WBC 12.8* 11/11/2015 0320   RBC 3.73* 11/11/2015 0320   HGB 12.9* 11/11/2015 1601   HCT 38.0* 11/11/2015 1601   PLT 352 11/11/2015 0320   MCV 90.9 11/11/2015 0320   MCH 27.6 11/11/2015 0320   MCHC 30.4 11/11/2015 0320   RDW 17.9* 11/11/2015 0320     BMET    Component Value Date/Time   NA 150* 11/11/2015 1601   K 3.1* 11/11/2015 1601   CL 95* 11/11/2015 1601   CO2 37* 11/11/2015 0320   GLUCOSE 99 11/11/2015 1601   BUN >140* 11/11/2015 1601   CREATININE 3.10* 11/11/2015 1601   CALCIUM 9.6 11/11/2015 0320   GFRNONAA 19* 11/11/2015 0320   GFRAA 23* 11/11/2015 0320     A/P:  Stable postop course. Continue current plans

## 2015-11-11 NOTE — Progress Notes (Signed)
11/11/2015- Respiratory care note-  Trach insertion performed by Dr Molli Knock at [phone] with RT assisting.  #6 shiley trach placed with no complications noted.  Placement visualized by bronchoscope.

## 2015-11-11 NOTE — Procedures (Signed)
Percutaneous Tracheostomy Placement  Consent from family.  Patient sedated, paralyzed and position.  Placed on 100% FiO2 and RR matched.  Area cleaned and draped.  Lidocaine/epi injected.  Skin incision done followed by blunt dissection.  Trachea palpated then punctured, catheter passed and visualized bronchoscopically.  Wire placed and visualized.  Catheter removed.  Airway then crushed and dilated.  Size 6 cuffed shiley trach placed and visualized bronchoscopically well above carina.  Good volume returns.  Patient tolerated the procedure well without complications.  Minimal blood loss.  CXR ordered and pending.  Wesam G. Yacoub, M.D. [facility]. Pager: [phone]. After hours pager: [phone]. 

## 2015-11-11 NOTE — Progress Notes (Signed)
Pharmacy Antibiotic Note  Christopher [name] is a 55 y.o. male with pneumonia.  Pharmacy has been consulted for vancomycin dosing.  Random level this AM drawn = 24 (~ 30 hrs after dose), should be appropriate for next dose as scheduled tonight at 10 PM.  Plan: Continue Vanc 1250mg  IV q48h D/c vancomycin 4/7 per CCM note?  No stop date entered yet.   Height: 5\' 10"  (177.8 cm) Weight: 236 lb 12.4 oz (107.4 kg) IBW/kg (Calculated) : 73  Temp (24hrs), Avg:98.4 F (36.9 C), Min:97.9 F (36.6 C), Max:98.9 F (37.2 C)   Recent Labs Lab 11/07/15 0337 11/07/15 2130 11/08/15 0400  11/09/15 0345 11/09/15 1519 11/10/15 0330 11/10/15 1519 11/11/15 0320  WBC 13.5*  --  12.7*  --  12.3*  --  12.0*  --  12.8*  CREATININE 3.54*  --  3.71*  < > 3.83* 4.10* 3.60* 3.60* 3.33*  VANCOTROUGH  --  20  --   --   --   --   --   --   --   VANCORANDOM  --   --   --   --   --   --   --   --  24  < > = values in this interval not displayed.  Estimated Creatinine Clearance: 31.1 mL/min (by C-G formula based on Cr of 3.33).    Allergies  Allergen Reactions  . Losartan Shortness Of Breath  . Lisinopril Rash    Antimicrobials this admission: 3/21 Cefuroxime  >> 3/23 3/27 Ceftazidime >> 3/29 3/29 Vancomycin >> 3/29 Zosyn  >> 4/1  4/1 Levaquin  >>  Microbiology results: 3/24 urine>> neg 3/27 resp>> normal flora  Tad Moore, BCPS  Clinical Pharmacist Pager [phone]  11/11/2015 2:50 PM

## 2015-11-12 ENCOUNTER — Inpatient Hospital Stay (HOSPITAL_COMMUNITY): Payer: BLUE CROSS/BLUE SHIELD

## 2015-11-12 DIAGNOSIS — Z9911 Dependence on respirator [ventilator] status: Secondary | ICD-10-CM

## 2015-11-12 DIAGNOSIS — Z930 Tracheostomy status: Secondary | ICD-10-CM

## 2015-11-12 LAB — RENAL FUNCTION PANEL
Albumin: 2.5 g/dL — ABNORMAL LOW (ref 3.5–5.0)
Anion gap: 16 — ABNORMAL HIGH (ref 5–15)
BUN: 146 mg/dL — ABNORMAL HIGH (ref 6–20)
CO2: 35 mmol/L — ABNORMAL HIGH (ref 22–32)
Calcium: 9.5 mg/dL (ref 8.9–10.3)
Chloride: 100 mmol/L — ABNORMAL LOW (ref 101–111)
Creatinine, Ser: 2.8 mg/dL — ABNORMAL HIGH (ref 0.61–1.24)
GFR calc Af Amer: 28 mL/min — ABNORMAL LOW (ref >60)
GFR calc non Af Amer: 24 mL/min — ABNORMAL LOW (ref >60)
Glucose, Bld: 126 mg/dL — ABNORMAL HIGH (ref 65–99)
Phosphorus: 4.3 mg/dL (ref 2.5–4.6)
Potassium: 3 mmol/L — ABNORMAL LOW (ref 3.5–5.1)
Sodium: 151 mmol/L — ABNORMAL HIGH (ref 135–145)

## 2015-11-12 LAB — CBC
HCT: 36.2 % — ABNORMAL LOW (ref 39.0–52.0)
Hemoglobin: 11 g/dL — ABNORMAL LOW (ref 13.0–17.0)
MCH: 27.8 pg (ref 26.0–34.0)
MCHC: 30.4 g/dL (ref 30.0–36.0)
MCV: 91.6 fL (ref 78.0–100.0)
Platelets: 348 10*3/uL (ref 150–400)
RBC: 3.95 MIL/uL — ABNORMAL LOW (ref 4.22–5.81)
RDW: 17.6 % — ABNORMAL HIGH (ref 11.5–15.5)
WBC: 12.1 10*3/uL — ABNORMAL HIGH (ref 4.0–10.5)

## 2015-11-12 LAB — POCT I-STAT 3, ART BLOOD GAS (G3+)
ACID-BASE EXCESS: 15 mmol/L — AB (ref 0.0–2.0)
Bicarbonate: 38.2 mEq/L — ABNORMAL HIGH (ref 20.0–24.0)
O2 Saturation: 97 %
PCO2 ART: 37.9 mmHg (ref 35.0–45.0)
PO2 ART: 74 mmHg — AB (ref 80.0–100.0)
Patient temperature: 98.6
TCO2: 39 mmol/L (ref 0–100)
pH, Arterial: 7.611 (ref 7.350–7.450)

## 2015-11-12 LAB — GLUCOSE, CAPILLARY
GLUCOSE-CAPILLARY: 110 mg/dL — AB (ref 65–99)
GLUCOSE-CAPILLARY: 113 mg/dL — AB (ref 65–99)
GLUCOSE-CAPILLARY: 113 mg/dL — AB (ref 65–99)
GLUCOSE-CAPILLARY: 116 mg/dL — AB (ref 65–99)
GLUCOSE-CAPILLARY: 116 mg/dL — AB (ref 65–99)
GLUCOSE-CAPILLARY: 129 mg/dL — AB (ref 65–99)

## 2015-11-12 LAB — MAGNESIUM: Magnesium: 2.5 mg/dL — ABNORMAL HIGH (ref 1.7–2.4)

## 2015-11-12 LAB — CARBOXYHEMOGLOBIN
Carboxyhemoglobin: 1 % (ref 0.5–1.5)
Methemoglobin: 0.9 % (ref 0.0–1.5)
O2 Saturation: 55.1 %
Total hemoglobin: 11.6 g/dL — ABNORMAL LOW (ref 13.5–18.0)

## 2015-11-12 LAB — POCT I-STAT, CHEM 8
BUN: 121 mg/dL — ABNORMAL HIGH (ref 6–20)
CALCIUM ION: 1.21 mmol/L (ref 1.12–1.23)
CHLORIDE: 103 mmol/L (ref 101–111)
Creatinine, Ser: 2.2 mg/dL — ABNORMAL HIGH (ref 0.61–1.24)
Glucose, Bld: 124 mg/dL — ABNORMAL HIGH (ref 65–99)
HCT: 37 % — ABNORMAL LOW (ref 39.0–52.0)
Hemoglobin: 12.6 g/dL — ABNORMAL LOW (ref 13.0–17.0)
POTASSIUM: 3.3 mmol/L — AB (ref 3.5–5.1)
Sodium: 152 mmol/L — ABNORMAL HIGH (ref 135–145)
TCO2: 35 mmol/L (ref 0–100)

## 2015-11-12 MED ORDER — DEXTROSE 5 % IV SOLN
250.0000 mg | Freq: Once | INTRAVENOUS | Status: AC
  Administered 2015-11-12: 250 mg via INTRAVENOUS
  Filled 2015-11-12: qty 250

## 2015-11-12 MED ORDER — FREE WATER
300.0000 mL | Freq: Four times a day (QID) | Status: DC
  Administered 2015-11-12 (×3): 300 mL

## 2015-11-12 MED ORDER — POTASSIUM CHLORIDE 20 MEQ/15ML (10%) PO SOLN
40.0000 meq | Freq: Three times a day (TID) | ORAL | Status: DC
  Administered 2015-11-12 – 2015-11-24 (×34): 40 meq via ORAL
  Filled 2015-11-12 (×34): qty 30

## 2015-11-12 MED ORDER — POTASSIUM CHLORIDE 20 MEQ/15ML (10%) PO SOLN
20.0000 meq | Freq: Every day | ORAL | Status: AC
  Administered 2015-11-12: 20 meq via ORAL
  Filled 2015-11-12: qty 15

## 2015-11-12 NOTE — Progress Notes (Signed)
Vent changes made per MD order. 

## 2015-11-12 NOTE — Progress Notes (Signed)
Titrated FIO2 to 40%

## 2015-11-12 NOTE — Progress Notes (Signed)
Patient seen for trach team follow up.  No education given at this time.  All needed equipment at the bedside.  Will continue to follow. 

## 2015-11-12 NOTE — Plan of Care (Signed)
Problem: Activity: Goal: Risk for activity intolerance will decrease Outcome: Not Progressing Pt has been bedridden for majority of hospitalization d/t unstable condition. Will have day-shift RN ask MD about creating a progressive mobility plan.

## 2015-11-12 NOTE — Plan of Care (Signed)
Problem: Respiratory: Goal: Levels of oxygenation will improve Outcome: Progressing Trach placed yesterday (04/05), pt still requiring vent support, FiO2 40% now.

## 2015-11-12 NOTE — Progress Notes (Signed)
TCTS BRIEF SICU PROGRESS NOTE  10 Days Post-Op  S/P Procedure(s) (LRB): Right/Left Heart Cath and Coronary/Graft Angiography (N/A) Intra-Aortic Balloon Pump Insertion   Stable day Delirium persists NSR w/ stable BP O2 sats 96-100% on 40% FiO2 Diuresing very well  Plan: Continue current plan  Purcell Nails, MD 11/12/2015 7:27 PM

## 2015-11-12 NOTE — Progress Notes (Signed)
PULMONARY / CRITICAL CARE MEDICINE   Name: Christopher [name] MRN: [id number] DOB: May 07, 1961    ADMISSION DATE:  10/20/2015 CONSULTATION DATE:  11/03/2015  REFERRING MD: Dr. Donata Clay / CVTS  CHIEF COMPLAINT:  Acute Hypoxic Respiratory Failure  HISTORY OF PRESENT ILLNESS:   [name] w/ ischemic cardiomyopathy following MI. Patient underwent CABG & CEA. Post-operative course complicated by arrhythmia and biventricular failure. Continues to have IABP in place. Requiring NO inhaled on ventilator. Continuing on Primacor and diuretics.   SUBJECTIVE:  Trach on 4/5 Some oozing around trach site, some blood-tinged secretions.   VITAL SIGNS: BP 117/84 mmHg  Pulse 76  Temp(Src) 98.4 F (36.9 C) (Oral)  Resp 16  Ht 5\' 10"  (1.778 m)  Wt 106.8 kg (235 lb 7.2 oz)  BMI 33.78 kg/m2  SpO2 100%  HEMODYNAMICS: CVP:  [14 mmHg-18 mmHg] 14 mmHg  VENTILATOR SETTINGS: Vent Mode:  [-] PRVC FiO2 (%):  [50 %-100 %] 70 % Set Rate:  [14 bmp-20 bmp] 16 bmp Vt Set:  [650 mL] 650 mL PEEP:  [5 cmH20] 5 cmH20 Plateau Pressure:  [19 cmH20-22 cmH20] 20 cmH20  INTAKE / OUTPUT: I/O last 3 completed shifts: In: 3806.1 [I.V.:1626.1; Other:30; NG/GT:2000; IV Piggyback:150] Out: 9010 [Urine:7610; Emesis/NG output:900; Stool:500]  PHYSICAL EXAMINATION: General:  Comfortable. Sedated.  Neuro:  Sedated.  Pupils equal.  Easily arousable and interactive. HEENT:  No scleral icterus or injection. Trach in place Cardiovascular:  Irregular, no M  Lungs:  Coarse breath sounds bilaterally. Symmetric chest rise on ventilator. Abdomen:  Soft. Protuberant. Normal BS. Integument:  Warm & dry. No rash on exposed skin.  LABS:  BMET  Recent Labs Lab 11/10/15 0330  11/11/15 0320 11/11/15 1601 11/12/15 0400  NA 141  < > 150* 150* 151*  K 3.6  < > 3.1* 3.1* 3.0*  CL 94*  < > 95* 95* 100*  CO2 31  --  37*  --  35*  BUN 156*  < > 164* >140* 146*  CREATININE 3.60*  < > 3.33* 3.10* 2.80*  GLUCOSE 140*  < >  116* 99 126*  < > = values in this interval not displayed. Electrolytes  Recent Labs Lab 11/10/15 0330 11/11/15 0320 11/12/15 0400  CALCIUM 8.7* 9.6 9.5  MG 2.8* 2.8* 2.5*  PHOS 5.9* 5.9*  5.7* 4.3   CBC  Recent Labs Lab 11/10/15 0330  11/11/15 0320 11/11/15 1601 11/12/15 0400  WBC 12.0*  --  12.8*  --  12.1*  HGB 7.6*  < > 10.3* 12.9* 11.0*  HCT 24.5*  < > 33.9* 38.0* 36.2*  PLT 313  --  352  --  348  < > = values in this interval not displayed. Coag's  Recent Labs Lab 11/11/15 0940  APTT 29  INR 1.44    Sepsis Markers No results for input(s): LATICACIDVEN, PROCALCITON, O2SATVEN in the last 168 hours. ABG  Recent Labs Lab 11/11/15 0323 11/12/15 0451 11/12/15 0457  PHART 7.485* 7.615* 7.611*  PCO2ART 54.7* 37.9 37.9  PO2ART 68.0* 74.0* 74.0*    Liver Enzymes  Recent Labs Lab 11/09/15 0345 11/10/15 0330 11/10/15 1040 11/11/15 0320 11/12/15 0400  AST 49* 47* 50*  --   --   ALT 52 52 56  --   --   ALKPHOS 89 75 79  --   --   BILITOT 1.1 0.6 1.0  --   --   ALBUMIN 2.7* 2.5* 2.8* 2.8* 2.5*    Cardiac Enzymes No results  for input(s): TROPONINI, PROBNP in the last 168 hours.  Glucose  Recent Labs Lab 11/11/15 0410 11/11/15 0727 11/11/15 1130 11/11/15 1919 11/11/15 2343 11/12/15 0354  GLUCAP 105* 111* 98 122* 113* 113*   Imaging Dg Chest Port 1 View  11/12/2015  CLINICAL DATA:  Respiratory failure. EXAM: PORTABLE CHEST 1 VIEW COMPARISON:  11/11/2015. FINDINGS: Tracheostomy tube, feeding tube, left IJ line, right PICC line in stable position . Prior CABG. Cardiomegaly with diffuse bilateral pulmonary infiltrates consistent pulmonary edema. Progressed from prior exam. Small bilateral pleural effusions. No pneumothorax. IMPRESSION: 1. Lines and tubes in stable position. 2. Prior CABG. Cardiomegaly with progressive bilateral pulmonary infiltrates consistent with pulmonary edema. Small bilateral pleural effusions are noted. Electronically Signed    By: Maisie Fus  Register   On: 11/12/2015 07:46   Dg Chest Port 1 View  11/11/2015  CLINICAL DATA:  Tracheostomy placement EXAM: PORTABLE CHEST 1 VIEW COMPARISON:  11/11/2015 FINDINGS: Tracheostomy tube has been placed with the tip projecting over the mid trachea. No pneumothorax. Right PICC line remains in place, unchanged as does left central line. Cardiomegaly with bilateral perihilar and lower lobe opacities, likely edema. Suspect layering effusions. IMPRESSION: Interval placement tracheostomy tube without pneumothorax. Suspected CHF. Bilateral effusions. Findings similar to prior study. Electronically Signed   By: Charlett Nose M.D.   On: 11/11/2015 14:50    STUDIES:  Port CXR 3/28:  Previously reviewed by me. R PICC in good psition. ETT >5cm above carina. New L [place] CVL. L lung opacity unchanged. Low lung volumes. L chest tube in place. Port CXR 3/30:  Personally reviewed by me.  MICROBIOLOGY: Tracheal Asp Ctx 3/27:  Oral Flora Urine Ctx 3/24:  Negative MRSA PCR 3/20:  Negative MRSA PCR 3/14:  Negative  ANTIBIOTICS: Zosyn 3/29>>> 4/1 Vancomcyin 3/21 - 3/22; 3/29>>> Cefuroxime 3/21 - 3/23 (periop prophylaxis) Elita Quick 3/27 - 3/29 levaquin 4/1 >>   SIGNIFICANT EVENTS: 3/14 - Admit 3/14 - LHC 3/21 - CABG 3/27 - IABP replaced >> 4/1 3/28 - Chest Tubes removed. RHC & LHC.  LINES/TUBES: OETT 7.5 Extubated 3/22; 3.24>>> 4/5 Trach (JY) 4/5 >>  L [place] CVL 3/28>>> R PICC 3/23>>> IABP 3/27>>> 4/1  ASSESSMENT / PLAN:  PULMONARY A: Acute Hypoxic Respiratory Failure Chronic Hypercarbic Respiratory Failure - OSA/OHS, COPD. Chronic hypoxemic resp failure - cardiogenic pulmonary edema  P:   PRVC, 650x16, 0.70 + 5 >> change to 8cc/kg, RR 12, 0.60 + PEEP 8 Back to 0.70 fiO2 post trach placement 4/5, evidence for progressive infiltrates likely cardiogenic edema. Increase PEEP to 8, wean fiO2 as able. Continue current efforts at volume removal Continue Xopenex q6hr   CARDIOVASCULAR A:  S/P  CABG & CEA Left Ventricular Heart Failure post MI Cardiogenic Shock Periop Complete Heart Block & Post-op V Tach H/O HTN  P:  CVTS & Cardiology Managing ASA VT Amiodarone gtt Milrinone gtt Lasix gtt held Heparin gtt on hold  Levophed off Transitioned from Inh NO to sildenifil , now off NO  RENAL Lab Results  Component Value Date   CREATININE 2.80* 11/12/2015   CREATININE 3.10* 11/11/2015   CREATININE 3.33* 11/11/2015    Recent Labs Lab 11/11/15 0320 11/11/15 1601 11/12/15 0400  K 3.1* 3.1* 3.0*  A:   Acute Renal Failure, non-oliguric now in auto-diuresis phase Hypoklalemia Hypernatremia due to aggresive diuresis   P:   Lasix gtt held, auto-diuresing Trending UOP with Foley Electrolytes & Renal Function daily Replacing electrolytes as indicated Free water per renal  GASTROINTESTINAL A:  Transaminitis - Likely shock liver/hepatic congestion. Constipation  P:   Continuing TFs Protonix IV daily Dulcolax PR prn Senna bid  HEMATOLOGIC  Recent Labs  11/11/15 1601 11/12/15 0400  HGB 12.9* 11.0*   A:   Anemia - Hgb worsening. No signs of active bleeding. P:  Transfusion PRBC as needed Heparin gtt Trending cell counts daily w/ CBC  INFECTIOUS A:   Possible HCAP - Ctx w/ Oral Flora.  P:   Abx as above; recommend day 9 of 10 empiric abx for most recent suspected HCAP Plan to re-culture for fever  ENDOCRINE CBG (last 3)   Recent Labs  11/11/15 1919 11/11/15 2343 11/12/15 0354  GLUCAP 122* 113* 113*   A:   Hyperglycemia - BG controlled.  P:   Continue Levemir bid Accu-checks q4hr SSI per algorithm  NEUROLOGIC A:   Sedation on Ventilator Post-op Pain Control  P:   RASS goal: 0 Goal lighten his sedation now that trach in place Versed gtt & IV prn Fentanyl gtt & IV prn  FAMILY UPDATES: Discussed 4/5, not present 4/6  Independent CC time 35 minutes   Levy Pupa, MD, PhD 11/12/2015, 8:25 AM [facility] [phone] or if no answer [phone]

## 2015-11-12 NOTE — Progress Notes (Signed)
eLink Physician-Brief Progress Note Patient Name: Christopher [name] DOB: [birth year] MRN: [id number]   Date of Service  11/12/2015  HPI/Events of Note  abg 7.611/37  eICU Interventions  RR=16     Intervention Category Major Interventions: Acid-Base disturbance - evaluation and management  Haleema Vanderheyden 11/12/2015, 5:21 AM

## 2015-11-12 NOTE — Progress Notes (Addendum)
Christopher [name] ID: Christopher [name], male   DOB: [birth year], 55 y.o.   MRN: [id number]   Christopher [name] is a 55 year old with a history of HTN, TIA, CHB, DMII, and marijuana use. On March 14th Christopher [name] presented to [facility] with increased dyspnea. On arrival Christopher [name] had inferior MI and CHB. Urgently transferred to [facility] for cath. LHC Ramus lesion, 70% stenosed, Prox LAD lesion, 95% stenosed, and prox RCA lesion, 100% stenosed. Developed pulmonary edema post cath and was diuresed IV lasix.   Cardiac Surgery consulted---10/27/15 S/P CABG x4 LIMA to LAD, SVG to DIAGONAL, SVG to RAMUS, INTERMEDIATE, SVG to RDA 3 and R CEA. Extubated 3/22. IABP removed 3/23 and remained on dopamine, milrinone, and epi. 3/23 had VF arrest requiring CPR shock x4. Re-intubated. CXR-infiltrate R lung base. Started on milrinone 0.2 mcg, amio drip, norepinephrine. Nitric oxide restarted at 10 ppm.  Christopher [name] had vfib arrest again 3/24 pm and lidocaine added.  VT 3/26-3/27 overnight again, 8 shocks, procainamide added and no further VT.  Stopped norepinephrine but milrinone up to 0.5 still with marginal co-ox.  In effort to decrease milrinone and decrease drive towards VT, Christopher [name] was taken back to cath lab and IABP placed again.  Milrinone decreased to 0.375 then 0.25.    Pacer now off, no further heart block.    IABP removed 4/2. Yesterday Milrinone cut back to 0.125 mcg and hydralazine was increased. Off diuretics per Nephrology. Christopher [name] was also trached yesterday. Creatinine 3.1>2.8.  BUN 146. Today CO-OX 55%. Sodium up to 151.   ECHO 10/02/15 EF 25-30% RV normal AK entire anteroseptal and inferoseptal myocardium.  TEE 10/27/2015: EF 35-40% RV mildly dilated TR mod regurg.  ECHO 10/30/15: EF 25-30% ECHO 11/06/15: EF 30-35%  LHC/RHC/IABP placement 3/27 Left Anterior Descending  Native left coronary was not injected to save contrast. LIMA-LAD was patent. SVG-small diagonal was patent. There was marked size mismatch between graft and artery with resulting slow flow.  There appeared to be about 80% stenosis in the diagonal distal to touchdown of graft.      Ramus Intermedius  Native LCA not injected to save contrast. SVG-ramus patent. There was a size mismatch between large vein graft and smaller caliber native artery. There appeared to be 80% stenosis in the ramus distal to the SVG touchdown and 70% stenosis proximal to SVG touchdown.     Left Circumflex  Not visualized, native LCA not injected to save contrast.     Right Coronary Artery  Native RCA known to be occluded, not injected. SVG-PDA patent.       Right Heart Pressures Procedural Findings: Hemodynamics (mmHg), on milrinone 0.5 RA mean 19 RV 44/23 PA 43/25, mean 33 PCWP mean 26 AO 124/71 Oxygen saturations: PA 64% AO 100% Cardiac Output (Fick) 6.52  Cardiac Index (Fick) 2.86 Cardiac Output (Thermo) 4.97 Cardiac Index (Thermo) 2.18    Scheduled Meds: . acetaZOLAMIDE (DIAMOX) IVPB  250 mg Intravenous Once  . amiodarone  150 mg Intravenous Once  . antiseptic oral rinse  7 mL Mouth Rinse QID  . aspirin  325 mg Per Tube Daily  . atorvastatin  80 mg Oral q1800  . bisacodyl  10 mg Oral Daily   Or  . bisacodyl  10 mg Rectal Daily  . chlorhexidine gluconate (SAGE KIT)  15 mL Mouth Rinse BID  . feeding supplement (PRO-STAT SUGAR FREE 64)  30 mL Per Tube TID  . fentaNYL (SUBLIMAZE) injection  25 mcg Intravenous Once  . hydrALAZINE  50 mg  Oral 3 times per day  . insulin aspart  0-24 Units Subcutaneous 6 times per day  . insulin detemir  12 Units Subcutaneous BID  . levalbuterol  1.25 mg Nebulization Q6H WA  . levofloxacin (LEVAQUIN) IV  750 mg Intravenous Q48H  . midazolam  1 mg Intravenous Once  . pantoprazole (PROTONIX) IV  40 mg Intravenous Daily  . potassium chloride  40 mEq Oral 3 times per day  . propofol  5-80 mcg/kg/min Intravenous Once  . QUEtiapine  50 mg Oral QHS  . sennosides  5 mL Per Tube BID  . sildenafil  40 mg Oral TID  . silver sulfADIAZINE   Topical BID  .  sodium chloride flush  10-40 mL Intracatheter Q12H  . triamcinolone cream   Topical BID  . vancomycin  1,250 mg Intravenous Q48H   Continuous Infusions: . sodium chloride 10 mL/hr at 11/12/15 0900  . amiodarone 30 mg/hr (11/12/15 0600)  . feeding supplement (VITAL HIGH PROTEIN) 1,000 mL (11/12/15 0900)  . fentaNYL infusion INTRAVENOUS 150 mcg/hr (11/12/15 0900)  . midazolam (VERSED) infusion 3 mg/hr (11/12/15 0900)  . milrinone 0.125 mcg/kg/min (11/12/15 0900)  . norepinephrine (LEVOPHED) Adult infusion Stopped (11/01/15 1930)   PRN Meds:.diphenhydrAMINE, fentaNYL, midazolam, ondansetron (ZOFRAN) IV, sodium chloride flush, traMADol    Filed Vitals:   11/12/15 0700 11/12/15 0800 11/12/15 0900 11/12/15 0904  BP: 117/84 100/59 99/62 99/62  Pulse: 76 80 78 77  Temp: 98.4 F (36.9 C)     TempSrc: Oral     Resp: _0 Height:    5' 10.5" (1.791 m)  Weight:      SpO2: 100% 100% 98% 97%    Intake/Output Summary (Last 24 hours) at 11/12/15 0936 Last data filed at 11/12/15 0900  Gross per 24 hour  Intake 2080.7 ml  Output   4810 ml  Net -2729.3 ml    LABS: Basic Metabolic Panel:  Recent Labs  11/11/15 0320 11/11/15 1601 11/12/15 0400  NA 150* 150* 151*  K 3.1* 3.1* 3.0*  CL 95* 95* 100*  CO2 37*  --  35*  GLUCOSE 116* 99 126*  BUN 164* >140* 146*  CREATININE 3.33* 3.10* 2.80*  CALCIUM 9.6  --  9.5  MG 2.8*  --  2.5*  PHOS 5.9*  5.7*  --  4.3   Liver Function Tests:  Recent Labs  11/10/15 0330 11/10/15 1040 11/11/15 0320 11/12/15 0400  AST 47* 50*  --   --   ALT 52 56  --   --   ALKPHOS 75 79  --   --   BILITOT 0.6 1.0  --   --   PROT 6.2* 6.8  --   --   ALBUMIN 2.5* 2.8* 2.8* 2.5*   No results for input(s): LIPASE, AMYLASE in the last 72 hours. CBC:  Recent Labs  11/11/15 0320 11/11/15 1601 11/12/15 0400  WBC 12.8*  --  12.1*  HGB 10.3* 12.9* 11.0*  HCT 33.9* 38.0* 36.2*  MCV 90.9  --  91.6  PLT 352  --  348   Cardiac Enzymes: No  results for input(s): CKTOTAL, CKMB, CKMBINDEX, TROPONINI in the last 72 hours. BNP: Invalid input(s): POCBNP D-Dimer: No results for input(s): DDIMER in the last 72 hours. Hemoglobin A1C: No results for input(s): HGBA1C in the last 72 hours. Fasting Lipid Panel: No results for input(s): CHOL, HDL, LDLCALC, TRIG, CHOLHDL, LDLDIRECT in the last 72 hours. Thyroid Function Tests: No results for input(s):  TSH, T4TOTAL, T3FREE, THYROIDAB in the last 72 hours.  Invalid input(s): FREET3 Anemia Panel: No results for input(s): VITAMINB12, FOLATE, FERRITIN, TIBC, IRON, RETICCTPCT in the last 72 hours.  RADIOLOGY: Dg Chest 2 View  10/23/2015  CLINICAL DATA:  CHF.  Weakness. EXAM: CHEST  2 VIEW COMPARISON:  10/21/2015 FINDINGS: Multiple interstitial densities in the lower chest are suggestive for interstitial edema, right side greater than left. Heart size is upper limits of normal. Trachea is midline. Negative for a pneumothorax. Evidence for a small right pleural effusion. IMPRESSION: Interstitial densities in the lower chest, right side greater than left. Findings are suggestive for interstitial pulmonary edema. Difficult to exclude some airspace disease in the right lower lung. Evidence for small right pleural effusion. Electronically Signed   By: Markus Daft M.D.   On: 10/23/2015 08:27   Dg Abd 1 View  11/03/2015  CLINICAL DATA:  Enteric tube advanced to post pyloric position. EXAM: ABDOMEN - 1 VIEW COMPARISON:  11/02/2015 abdominal radiographs. FINDINGS: Fluoroscopy time 7 minutes. Enteric tube courses through the stomach and duodenum and appears to terminate near the duodenal jejunal junction. Instilled contrast opacifies nondilated proximal jejunal small bowel loops. No contrast leak is demonstrated. Separate enteric tube terminates in the body of the stomach. Visualized lower sternotomy wires appear aligned and intact. IMPRESSION: Enteric tube appears to terminate near the duodenal jejunal junction.  Separate enteric tube terminates in the body of the stomach. Electronically Signed   By: Ilona Sorrel M.D.   On: 11/03/2015 17:02   Dg Chest Port 1 View  11/12/2015  CLINICAL DATA:  Respiratory failure. EXAM: PORTABLE CHEST 1 VIEW COMPARISON:  11/11/2015. FINDINGS: Tracheostomy tube, feeding tube, left IJ line, right PICC line in stable position . Prior CABG. Cardiomegaly with diffuse bilateral pulmonary infiltrates consistent pulmonary edema. Progressed from prior exam. Small bilateral pleural effusions. No pneumothorax. IMPRESSION: 1. Lines and tubes in stable position. 2. Prior CABG. Cardiomegaly with progressive bilateral pulmonary infiltrates consistent with pulmonary edema. Small bilateral pleural effusions are noted. Electronically Signed   By: Marcello Moores  Register   On: 11/12/2015 07:46   Dg Chest Port 1 View  11/11/2015  CLINICAL DATA:  Tracheostomy placement EXAM: PORTABLE CHEST 1 VIEW COMPARISON:  11/11/2015 FINDINGS: Tracheostomy tube has been placed with the tip projecting over the mid trachea. No pneumothorax. Right PICC line remains in place, unchanged as does left central line. Cardiomegaly with bilateral perihilar and lower lobe opacities, likely edema. Suspect layering effusions. IMPRESSION: Interval placement tracheostomy tube without pneumothorax. Suspected CHF. Bilateral effusions. Findings similar to prior study. Electronically Signed   By: Rolm Baptise M.D.   On: 11/11/2015 14:50   Dg Chest Port 1 View  11/11/2015  CLINICAL DATA:  Respiratory failure. EXAM: PORTABLE CHEST 1 VIEW COMPARISON:  11/10/2015. FINDINGS: Endotracheal tube, feeding tube, bilateral subclavian lines in stable position. Prior CABG. Cardiomegaly with diffuse bilateral pulmonary infiltrates consistent with pulmonary edema. Worsening from prior exam. Small left pleural effusion. No pneumothorax. IMPRESSION: 1.  Lines and tubes in stable position. 2. Prior CABG. Cardiomegaly with worsening bilateral pulmonary edema. Small  left pleural effusion. Electronically Signed   By: Marcello Moores  Register   On: 11/11/2015 07:17   Dg Chest Port 1 View  11/10/2015  CLINICAL DATA:  Shortness of breath . EXAM: PORTABLE CHEST 1 VIEW COMPARISON:  11/09/2015. FINDINGS: Endotracheal tube, NG tube, right PICC line, left subclavian line in stable position. Prior CABG. Cardiomegaly. Left lower lobe atelectasis and/or infiltrate with small left pleural effusion. Mild  right base atelectasis and or infiltrate. No pneumothorax . IMPRESSION: 1.  Lines and tubes in stable position. 2. Left lower lobe atelectasis and/or infiltrate with small left pleural effusion. Mild right base atelectasis and/or infiltrate. Findings stable from prior exam. 3.  Prior CABG.  Stable cardiomegaly. Electronically Signed   By: Marcello Moores  Register   On: 11/10/2015 07:51   Dg Chest Port 1 View  11/09/2015  CLINICAL DATA:  Atelectasis. EXAM: PORTABLE CHEST 1 VIEW COMPARISON:  11/08/2015 FINDINGS: Endotracheal tube in satisfactory position. Right arm PICC tip in the lower SVC unchanged. Left subclavian central venous catheter in the SVC. NG tube and feeding tube enters the stomach with the tips not visualized. Negative for pneumothorax. Left lower lobe airspace disease unchanged. Small left effusion unchanged. Mild pulmonary vascular congestion unchanged. IMPRESSION: Support lines remain in satisfactory position and unchanged Left lower lobe atelectasis/ infiltrate unchanged. Mild right lower lobe airspace consolidation stable. Mild vascular congestion unchanged. Electronically Signed   By: Franchot Gallo M.D.   On: 11/09/2015 07:36   Dg Chest Port 1 View  11/08/2015  CLINICAL DATA:  Respiratory failure, hypertension, coronary artery disease post MI and CABG EXAM: PORTABLE CHEST 1 VIEW COMPARISON:  Portable exam 1216 hours compared to 11/06/2015 FINDINGS: Tip of endotracheal tube projects 6.8 cm above carina. Nasogastric tube and feeding tube extends into stomach. LEFT subclavian central  venous catheter with tip projecting over SVC. RIGHT arm PICC line tip projects over SVC. Enlargement of cardiac silhouette post CABG. Stable mediastinal contours. Increased LEFT lower lobe consolidation. Slightly increased infiltrate at RIGHT base as well. Minimal central peribronchial thickening. No pleural effusion or pneumothorax. IMPRESSION: Enlargement of cardiac silhouette post CABG. Stable line and tube positions. Increased LEFT lower lobe consolidation with minimally increased infiltrate at RIGHT base as well. Findings are suspicious for pneumonia involving the LEFT lower lobe though a component of coexisting pulmonary edema may be present. Electronically Signed   By: Lavonia Dana M.D.   On: 11/08/2015 12:47   Dg Chest Port 1 View  11/06/2015  CLINICAL DATA:  Acute CHF secondary to acute MI, complete heart block. EXAM: PORTABLE CHEST 1 VIEW COMPARISON:  Portable chest x-ray of November 05, 2015 FINDINGS: The lungs are adequately inflated. The pulmonary interstitial markings are less prominent on the right today. They have improved on the left as well and the hemidiaphragm is now visible. There is persistent interstitial edema on the left. The cardiac silhouette remains enlarged. The intraaortic balloon pump marker tip projects over the proximal portion of descending thoracic aorta and appears stable. The endotracheal tube tip projects 5.5 cm above the carina. The nasogastric and Doppler AH feeding tubes have their tips below the inferior margin of the image. The right-sided PICC line tip projects over the junction of the middle and distal thirds of the SVC. The left subclavian venous catheter tip projects over the midportion of the SVC. IMPRESSION: Decreasing pulmonary edema consistent with improvement in CHF. Persistent perihilar interstitial edema on the left. The support tubes and devices are in reasonable position. Electronically Signed   By: David  Martinique M.D.   On: 11/06/2015 07:32   Dg Chest Port 1  View  11/05/2015  CLINICAL DATA:  Respiratory failure. EXAM: PORTABLE CHEST 1 VIEW COMPARISON:  11/04/2015 . FINDINGS: Endotracheal tube, NG tube, right PICC line stable position. Prior CABG. Cardiomegaly. Persistent left mid and lower lung field infiltrate. Persistent mild atelectatic changes right upper and right lower lobes. No pneumothorax. IMPRESSION: 1.  Lines and  tubes in stable position. 2. Persistent left mid and left lower lung infiltrate, no interim change. Persistent right upper lobe and right lower lobe subsegmental atelectasis. No interim change. 3. Prior CABG.  Stable cardiomegaly. Electronically Signed   By: Marcello Moores  Register   On: 11/05/2015 07:48   Dg Chest Port 1 View  11/04/2015  CLINICAL DATA:  Hypoxia EXAM: PORTABLE CHEST 1 VIEW COMPARISON:  November 04, 2015 FINDINGS: Endotracheal tube tip is 5.3 cm above the carina. Central catheter tip is in the superior vena cava near the cavoatrial junction. Nasogastric tube tip and side port are below the diaphragm. There also is a feeding tube with the tip below the diaphragm. No pneumothorax. There has been removal of a left chest tube. There is airspace consolidation throughout the left mid and lower lung zones, stable. There is mild atelectasis in the right upper lobe, stable. No new opacity is evident. There is stable cardiomegaly. The pulmonary vascularity is normal. No adenopathy is evident. IMPRESSION: Tube and catheter positions as described without pneumothorax. Airspace consolidation throughout the left mid and lower lung zones, stable. Atelectasis right upper lobe, stable. No new opacity. Stable cardiomegaly. Electronically Signed   By: Lowella Grip III M.D.   On: 11/04/2015 14:52   Dg Chest Port 1 View  11/04/2015  CLINICAL DATA:  Status post CABG 10/28/2015. EXAM: PORTABLE CHEST 1 VIEW COMPARISON:  Single view of the chest 11/03/2015 and 11/02/2015. FINDINGS: Support tubes and lines including a left chest tube are unchanged since the  most recent exam. The chest is better expanded today with decreased atelectasis. Airspace opacity in the left mid and lower lung zones persists. There is likely a left pleural effusion. Marked cardiomegaly is seen with only mild vascular congestion noted. IMPRESSION: Increased pulmonary expansion with decreased scattered atelectasis. Left mid and lower lung zone airspace disease could be due to atelectasis or pneumonia. Small left pleural effusion noted. Support tubes and lines projecting good position.  No pneumothorax. Electronically Signed   By: Inge Rise M.D.   On: 11/04/2015 07:30   Dg Chest Port 1 View  11/03/2015  CLINICAL DATA:  Central line placement. EXAM: PORTABLE CHEST 1 VIEW COMPARISON:  11/03/2015. FINDINGS: Left subclavian central line noted with tip at the cavoatrial junction. Interval removal of right chest tube. Endotracheal tube, NG tube, right PICC line, left IJ sheath left chest tube in stable position. No pneumothorax. Prior CABG. Stable cardiomegaly. Low lung volumes. A mild component of congestive heart failure cannot be excluded. Similar findings noted on prior exam P IMPRESSION: 1. Interim placement of left subclavian central line, its tip is at the cavoatrial junction. Interim removal right chest tube . No pneumothorax. 2. Remaining lines and tubes including left chest tube in stable position. 3. Prior CABG. Stable cardiomegaly. Low lung volumes. A a mild component congestive heart failure cannot be excluded . Similar findings noted on prior exam. Electronically Signed   By: [place]   On: 11/03/2015 09:58   Dg Chest Port 1 View  11/03/2015  CLINICAL DATA:  Status post CABG on October 27, 2015 persistent left lower lobe atelectasis or pneumonia. No pneumothorax or significant pleural effusion. EXAM: PORTABLE CHEST 1 VIEW COMPARISON:  Portable chest x-ray of November 02, 2015 at 5:48 a.m. FINDINGS: There has been interval placement of an intra aortic balloon pump whose  marker lies over the junction of the aortic arch with the proximal descending thoracic aorta. The cardiac silhouette remains enlarged. The pulmonary vascularity remains mildly  prominent. The retrocardiac region on the left remains dense. Bilateral chest tubes are in stable position. There is no pneumothorax nor significant pleural effusion. The endotracheal tube tip lies approximately 6.5 cm above the carina. The esophagogastric tube tip projects below the inferior margin of the image. The right-sided PICC line tip projects over the midportion of the SVC. A previously demonstrated pericardial drain is not clearly evident on today's study. IMPRESSION: Mild CHF. Persistent left lower lobe atelectasis or pneumonia. Interval placement of an intra aortic balloon pump which appears to be in appropriate position radiographically. The other support tubes and lines are in reasonable position. Electronically Signed   By: David  Martinique M.D.   On: 11/03/2015 07:27   Dg Chest Port 1 View  11/02/2015  CLINICAL DATA:  CABG. EXAM: PORTABLE CHEST 1 VIEW COMPARISON:  11/01/2015. FINDINGS: Endotracheal tube, NG tube, right PICC line, bilateral chest tubes in stable position. Prior CABG. Stable cardiomegaly . Persistent mild interstitial prominence, congestive heart failure cannot be excluded. No interim change. Low lung volumes with basilar atelectasis . IMPRESSION: 1. Endotracheal tube, NG tube, right PICC line, bilateral chest tubes in stable position . No pneumothorax. 2. Prior CABG. Cardiomegaly with pulmonary interstitial prominence and small left pleural effusion consistent with mild congestive heart failure. No interim change from prior exam . Electronically Signed   By: Marcello Moores  Register   On: 11/02/2015 07:20   Dg Chest Port 1 View  11/01/2015  CLINICAL DATA:  Post CABG EXAM: PORTABLE CHEST 1 VIEW COMPARISON:  10/31/2015 FINDINGS: Cardiomediastinal silhouette is stable. Status post CABG. Stable endotracheal and NG tube  position. Left chest tube is unchanged in position. Persistent central vascular congestion and mild interstitial prominence bilaterally suspicious for the mild interstitial edema. Left IJ sheath in place. Probable small left pleural effusion left basilar atelectasis or infiltrate. Right arm PICC line is unchanged in position. There is no pneumothorax. IMPRESSION: Status post CABG. Stable support apparatus. Stable left chest tube position. No pneumothorax. Again noted bilateral mild interstitial prominence and perihilar opacities suspicious for mild pulmonary edema. Probable small left pleural effusion left basilar atelectasis or infiltrate. Electronically Signed   By: Lahoma Crocker M.D.   On: 11/01/2015 09:57   Dg Chest Port 1 View  10/31/2015  CLINICAL DATA:  55 year old male with a history of endotracheal tube placement Christopher [name] has undergone coronary artery bypass grafting x4 10/27/2015. Placement of intra aortic balloon pump at this date. Ischemic cardiomyopathy. EXAM: PORTABLE CHEST 1 VIEW COMPARISON:  10/30/2015. FINDINGS: Endotracheal tube terminates approximately 3.4 cm above the carina, unchanged. Gastric tube terminates out of the field of view. Defibrillator pads project over the left and right chest. Bilateral thoracostomy tubes. Right upper extremity PICC. The mediastinal drain not visualized. Intra-aortic balloon pump not visualized. Surgical changes of median sternotomy and CABG. Mixed bilateral interstitial and airspace opacities. Low lung volumes. IMPRESSION: Low lung volumes with mixed bilateral interstitial and airspace opacities, similar to the comparison chest x-ray. Retrocardiac opacity may reflect persistent pleural fluid, and/or atelectasis. Surgical support apparatus appear unchanged, as above. Intra aortic balloon pump not visualized. Signed, Dulcy Fanny. Earleen Newport, DO [facility] Electronically Signed   By: Corrie Mckusick D.O.   On:  10/31/2015 07:25   Dg Chest Port 1 View  10/30/2015  CLINICAL DATA:  Post cavity.  Recent CABG. EXAM: PORTABLE CHEST 1 VIEW COMPARISON:  Portable film earlier today. FINDINGS: Overlying support apparatus redemonstrated. Tubes and lines remain stable. Elevated LEFT hemidiaphragm. No definite  pneumothorax. Moderate vascular congestion may be increased. Cardiomegaly. IMPRESSION: Slight worsening aeration. Moderate vascular congestion may be increased. Electronically Signed   By: Staci Righter M.D.   On: 10/30/2015 18:39   Dg Chest Port 1 View  10/30/2015  CLINICAL DATA:  Status post coronary bypass grafting EXAM: PORTABLE CHEST 1 VIEW COMPARISON:  10/30/2015 FINDINGS: Cardiac shadow remains enlarged. Bilateral chest tubes are again seen. A right-sided PICC line and endotracheal tube are noted in satisfactory position. Left jugular sheath is noted. Elevation of left hemidiaphragm is noted with left basilar atelectasis. No pneumothorax is noted. IMPRESSION: Postsurgical change with tubes and lines as described. Mild left basilar atelectasis. Electronically Signed   By: Inez Catalina M.D.   On: 10/30/2015 07:45   Dg Chest Port 1 View  10/30/2015  CLINICAL DATA:  A chest tube in place EXAM: PORTABLE CHEST 1 VIEW COMPARISON:  10/30/2015 FINDINGS: Postoperative changes in the mediastinum. Endotracheal tube with tip measuring 6.3 cm about the carina. Right PICC catheter with tip over the cavoatrial junction. Left central venous catheter sheath with tip over the left neck consistent location in the internal jugular vein. Bilateral chest tubes. Infiltration or atelectasis in the right lung base. Cardiac enlargement. No significant vascular congestion. No pneumothorax. IMPRESSION: Appliance positioned as described. Cardiac enlargement with infiltration or atelectasis in the right lung base. Bilateral chest tubes are present but no visualized pneumothorax. Electronically Signed   By: Lucienne Capers M.D.   On: 10/30/2015  03:07   Dg Chest Port 1 View  10/30/2015  CLINICAL DATA:  Shortness of breath.  Code blue. EXAM: PORTABLE CHEST 1 VIEW COMPARISON:  10/29/2015 FINDINGS: Postoperative changes in the mediastinum. Bilateral chest tubes are present. Right PICC line with tip over the cavoatrial junction. Left central venous catheter or sheath with tip over the left side of the neck, likely in the left internal jugular vein. Cardiac enlargement without significant vascular congestion. Shallow inspiration with elevation of the left hemidiaphragm. Probable left pleural effusion. No definite pulmonary consolidation. IMPRESSION: Shallow inspiration. Probable left pleural effusion. Cardiac enlargement. Appliances appear in satisfactory position. Electronically Signed   By: Lucienne Capers M.D.   On: 10/30/2015 01:18   Dg Chest Port 1 View  10/29/2015  CLINICAL DATA:  Postop CABG 2 days ago. EXAM: PORTABLE CHEST 1 VIEW COMPARISON:  Portable chest x-ray of October 28, 2015 FINDINGS: The trachea and esophagus have been extubated. The cardiac silhouette remains enlarged. The pulmonary vascularity is mildly engorged. The bilateral chest tubes and the mediastinal drain are in stable position. There is no pneumothorax or large pleural effusion. The Swan-Ganz catheter tip overlies a proximal right lower lobe pulmonary artery branch. The mediastinum is widened and accentuated by the hypo inflation. IMPRESSION: Bilateral hypoinflation following extubation of the trachea there is crowding of the pulmonary vascularity. Left basilar atelectasis or less likely pneumonia is more prominent today. Probable small left pleural effusion. The remaining support tubes and lines are in stable position. Electronically Signed   By: David  Martinique M.D.   On: 10/29/2015 07:40   Dg Chest Port 1 View  10/28/2015  CLINICAL DATA:  Post CABG, on ventilator, followup portable chest x-ray of 10/28/2015 EXAM: PORTABLE CHEST 1 VIEW COMPARISON:  None. FINDINGS: The tip of  the endotracheal tube is approximately 5.2 cm above the carina. Aeration has improved slightly. Atelectasis remains at the lung bases left-greater-than-right. Swan-Ganz catheter tip is in the right lower lobe pulmonary artery and bilateral chest tubes remain. No pneumothorax is seen.  Heart size is stable. IMPRESSION: 1. Improved aeration. 2. Bilateral chest tubes.  No pneumothorax. 3. Tip of endotracheal tube approximately 5.2 cm above the carina. Electronically Signed   By: Ivar Drape M.D.   On: 10/28/2015 08:01   Dg Chest Port 1 View  10/27/2015  CLINICAL DATA:  Status post CABG EXAM: PORTABLE CHEST 1 VIEW COMPARISON:  10/27/2015 FINDINGS: Unchanged tracheostomy tube. Swan-Ganz central venous line is now seen with tip advanced into the right perihilar region. Stable right chest tube with and left chest tube. NG tube again crosses the gastroesophageal junction. Stable mediastinal drain. Enlarged cardiac silhouette. Limited inspiratory effect with bibasilar opacities likely representing atelectasis. IMPRESSION: Anticipated postoperative appearance with bibasilar atelectasis. Electronically Signed   By: Skipper Cliche M.D.   On: 10/27/2015 19:25   Dg Chest Portable 1 View  10/27/2015  CLINICAL DATA:  Status post coronary bypass grafting EXAM: PORTABLE CHEST 1 VIEW COMPARISON:  10/23/2015 FINDINGS: Cardiac shadow is mildly enlarged. A Swan-Ganz catheter is noted in the right pulmonary outflow tract. Bilateral thoracostomy catheters are seen. No pneumothorax is noted. An endotracheal tube is noted in satisfactory position. Intra-aortic balloon pump is noted over the aortic arch. Mild left basilar atelectasis is seen. Mild central vascular congestion is noted. IMPRESSION: Postoperative changes with tubes and lines as described above. Mild vascular congestion and left basilar atelectasis. Electronically Signed   By: Inez Catalina M.D.   On: 10/27/2015 17:23   Portable Chest X-ray 1 View  10/21/2015  CLINICAL  DATA:  55 year old male with a history of acute systolic congestive heart failure EXAM: PORTABLE CHEST 1 VIEW COMPARISON:  10/20/2015 FINDINGS: Heart size unchanged, enlarged. Similar appearance of low lung volumes with interstitial opacities, interlobular septal thickening, and developing airspace disease of the bilateral lower lungs. No pneumothorax. Linear opacity overlying the mediastinum and the heart border, of on certain significance. Favored to overlie the Christopher [name]. IMPRESSION: Evidence of worsening congestive heart failure, with increasing edema. New linear opacity overlies the mediastinum, favored to be overlying the Christopher [name], though cannot be localized on this view. Signed, Dulcy Fanny. Earleen Newport, DO [facility] Electronically Signed   By: Corrie Mckusick D.O.   On: 10/21/2015 07:16   Dg Chest Portable 1 View  10/20/2015  CLINICAL DATA:  Shortness of breath. EXAM: PORTABLE CHEST 1 VIEW COMPARISON:  None. FINDINGS: Midline trachea. Cardiomegaly accentuated by AP portable technique. No pleural effusion or pneumothorax. Mildly low lung volumes with bibasilar atelectasis. IMPRESSION: Mild cardiomegaly and low lung volumes.  No acute findings. Electronically Signed   By: Abigail Miyamoto M.D.   On: 10/20/2015 18:09   Dg Abd Portable 1v  11/02/2015  CLINICAL DATA:  Assess feeding tube positioning EXAM: PORTABLE ABDOMEN - 1 VIEW COMPARISON:  None in PACs FINDINGS: The repeated KUB with has less motion artifact. The tip of the feeding tube and nasogastric tube lie in the region of the distal gastric body in the pre-pyloric region. The bowel gas pattern is within the limits of normal. Numerous tubes and lines and electrodes overlie the abdomen. IMPRESSION: The tips of the feeding tube and the esophagogastric tube lie in the region of the distal gastric body in the pre-pyloric region. Electronically Signed   By: David  Martinique M.D.   On: 11/02/2015 12:16   Dg Addison Bailey  G Tube Plc W/fl-no Rad  11/03/2015  CLINICAL DATA:  NASO G TUBE PLACEMENT WITH FLUORO Fluoroscopy was utilized by the requesting physician.  No radiographic interpretation.  PHYSICAL EXAM CVP 16  General: Awake on vent Neck: JVP hard to see.  no thyromegaly or thyroid nodule. Trach bloody exudate noted.  Lungs: Rhonchi through out. ecreased breath sounds at bases bilaterally.  CV: Nondisplaced PMI.  Heart regular S1/S2, no S3/S4, no murmur. Trace ankle edema. Abdomen: Soft, nontender, no hepatosplenomegaly, no distention.  Neurologic: On vent alert and follows commands.  Psych: on vent  Extremities: No clubbing or cyanosis. Right groin IABP site stable. Dopplerable pedal pulses. Maculopapular rash on his sides  TELEMETRY: Reviewed telemetry, NSR with PVCs  ASSESSMENT AND PLAN:  1. CAD: S/p late presentation inferior MI and CABG x 4. Continue statin, ASA. Coronary angiography 3/27 with multiple VT episodes showed patent LIMA-LAD and SVG-RCA.  Patent SVG-ramus and SVG-D but slow flow in both grafts with diffusely diseased small target vessels.  2. Cardiogenic shock: Acute systolic CHF with RV failure post inferior MI with suspected RV involvement. EF 25-30% on echo (difficult images on 3/24 echo). Christopher [name] has significant RV failure from suspected RV infarct.  3/27 IABP placed to try to allow decrease in milrinone with frequent VT.  3/31 echo with EF 30-35%.  IABP out 4/2.   - Today CO-OX is marginal 55%. Continue milrinone 0.125 mcg.  - CVP 16. Off diuretics, auto-diuresing quite well.   - Christopher [name] is now off NO and on sildenafil 40 tid.   -Continue hydralazine to 50 mg tid for afterload reduction (no Imdur with sildenafil use).   3. Complete heart block: Pacemaker turned off 4/3.  Underlying NSR in 70s.  4. VF arrest 3/24 early am and again at night 3/24.  Only occasional PVCs today.  - Continue amiodarone.  5. AKI: Creatinine lower today.  Suspect ATN. Baseline creatinine 1.0. Renal following,  given good UOP and apparent lack of uremic symptoms, no RRT yet.  Holding diuretics per nephrology.  6. ID: Possible LLL PNA.  Christopher [name] is on vancomycin/levofloxacin.  7. Anemia: No overt bleeding.  Got 2 units PRBCs on 4/4. Hemoglobin 11.  8. Pulmonary: Pulmonary edema, LLL PNA.  Failed SBT.  S/P Trach 4/5   9. Hypernatremia: Needs free water boluses.  10. Atrial flutter/coarse fib: Noted on monitor today.  HR controlled.  Continue amiodarone.  If this persists, will need to start anticoagulation.   The Christopher [name] is critically ill with multiple organ systems failure and requires high complexity decision making for assessment and support, frequent evaluation and titration of therapies, application of advanced monitoring technologies and extensive interpretation of multiple databases.   Critical Care Time devoted to Christopher [name] care services described in this note is 35 Minutes.  Amy Clegg NP-C  11/12/2015 9:36 AM   Christopher [name] seen with NP, agree with the above note.  Co-ox 55% and CVP 16.  Auto-diuresing off Lasix.  Creatinine trending down now.  Continue to hold diuretics.  Would keep milrinone at 0.125 for now.    Trached yesterday.   Noted to be in rate-controlled atypical flutter/coarse fib this morning.  Continue amiodarone, may need anticoagulation.  Will get ECG.   Hypernatremia: needs free water boluses.   35 minutes critical care time.   Loralie Champagne 11/12/2015 10:38 AM

## 2015-11-12 NOTE — Progress Notes (Signed)
S: Had trach yest O:BP 105/69 mmHg  Pulse 73  Temp(Src) 98.6 F (37 C) (Oral)  Resp 16  Ht 5' 10"  (1.778 m)  Wt 106.8 kg (235 lb 7.2 oz)  BMI 33.78 kg/m2  SpO2 96%  Intake/Output Summary (Last 24 hours) at 11/12/15 0656 Last data filed at 11/12/15 0600  Gross per 24 hour  Intake 2488.99 ml  Output   5175 ml  Net -2686.01 ml   Weight change: -0.6 kg (-1 lb 5.2 oz) FXT:KWIOX and alert, trached CVS:RRR Resp: Clear ant Abd:+ BS NTND Ext: No edema Rt PICC NEURO:CNI  Follows commands  + asterixis Skin: Rash improving Lt IJ triple lumen   . amiodarone  150 mg Intravenous Once  . antiseptic oral rinse  7 mL Mouth Rinse QID  . aspirin  325 mg Per Tube Daily  . atorvastatin  80 mg Oral q1800  . bisacodyl  10 mg Oral Daily   Or  . bisacodyl  10 mg Rectal Daily  . chlorhexidine gluconate (SAGE KIT)  15 mL Mouth Rinse BID  . feeding supplement (PRO-STAT SUGAR FREE 64)  30 mL Per Tube TID  . fentaNYL (SUBLIMAZE) injection  25 mcg Intravenous Once  . hydrALAZINE  50 mg Oral 3 times per day  . insulin aspart  0-24 Units Subcutaneous 6 times per day  . insulin detemir  12 Units Subcutaneous BID  . levalbuterol  1.25 mg Nebulization Q6H WA  . levofloxacin (LEVAQUIN) IV  750 mg Intravenous Q48H  . midazolam  1 mg Intravenous Once  . pantoprazole (PROTONIX) IV  40 mg Intravenous Daily  . potassium chloride  20 mEq Oral 3 times per day  . propofol  5-80 mcg/kg/min Intravenous Once  . QUEtiapine  50 mg Oral QHS  . sennosides  5 mL Per Tube BID  . sildenafil  40 mg Oral TID  . silver sulfADIAZINE   Topical BID  . sodium chloride flush  10-40 mL Intracatheter Q12H  . triamcinolone cream   Topical BID  . vancomycin  1,250 mg Intravenous Q48H   Dg Chest Port 1 View  11/11/2015  CLINICAL DATA:  Tracheostomy placement EXAM: PORTABLE CHEST 1 VIEW COMPARISON:  11/11/2015 FINDINGS: Tracheostomy tube has been placed with the tip projecting over the mid trachea. No pneumothorax. Right PICC  line remains in place, unchanged as does left central line. Cardiomegaly with bilateral perihilar and lower lobe opacities, likely edema. Suspect layering effusions. IMPRESSION: Interval placement tracheostomy tube without pneumothorax. Suspected CHF. Bilateral effusions. Findings similar to prior study. Electronically Signed   By: Rolm Baptise M.D.   On: 11/11/2015 14:50   Dg Chest Port 1 View  11/11/2015  CLINICAL DATA:  Respiratory failure. EXAM: PORTABLE CHEST 1 VIEW COMPARISON:  11/10/2015. FINDINGS: Endotracheal tube, feeding tube, bilateral subclavian lines in stable position. Prior CABG. Cardiomegaly with diffuse bilateral pulmonary infiltrates consistent with pulmonary edema. Worsening from prior exam. Small left pleural effusion. No pneumothorax. IMPRESSION: 1.  Lines and tubes in stable position. 2. Prior CABG. Cardiomegaly with worsening bilateral pulmonary edema. Small left pleural effusion. Electronically Signed   By: Marcello Moores  Register   On: 11/11/2015 07:17   BMET    Component Value Date/Time   NA 151* 11/12/2015 0400   K 3.0* 11/12/2015 0400   CL 100* 11/12/2015 0400   CO2 35* 11/12/2015 0400   GLUCOSE 126* 11/12/2015 0400   BUN 146* 11/12/2015 0400   CREATININE 2.80* 11/12/2015 0400   CALCIUM 9.5 11/12/2015 0400  GFRNONAA 24* 11/12/2015 0400   GFRAA 28* 11/12/2015 0400   CBC    Component Value Date/Time   WBC 12.1* 11/12/2015 0400   RBC 3.95* 11/12/2015 0400   HGB 11.0* 11/12/2015 0400   HCT 36.2* 11/12/2015 0400   PLT 348 11/12/2015 0400   MCV 91.6 11/12/2015 0400   MCH 27.8 11/12/2015 0400   MCHC 30.4 11/12/2015 0400   RDW 17.6* 11/12/2015 0400     Assessment:  1. ARF most likely due to ATN, Scr trending down, BUN still high from steroids and probable intravascular volume depletion 2. SP CABG complicated by VT 3. Hx HTN 4. ? Allergic rxn 5. Hypernatremia  Plan: 1. Cont to hold diuretics 2. Replace K further and increase back to 92mq tid 3. Daily labs   Christopher [name] T

## 2015-11-12 NOTE — Progress Notes (Signed)
10 Days Post-Op Procedure(s) (LRB): Right/Left Heart Cath and Coronary/Graft Angiography (N/A) Intra-Aortic Balloon Pump Insertion Subjective: Trach yesterday for vent dependence nsr this am Good urine output off lasix Trach site with some bleeding  Objective: Vital signs in last 24 hours: Temp:  [97.9 F (36.6 C)-98.7 F (37.1 C)] 98.4 F (36.9 C) (04/06 0700) Pulse Rate:  [54-86] 76 (04/06 0700) Cardiac Rhythm:  [-] Atrial fibrillation (04/06 0600) Resp:  [7-23] 16 (04/06 0700) BP: (101-166)/(54-89) 117/84 mmHg (04/06 0700) SpO2:  [85 %-100 %] 100 % (04/06 0700) Arterial Line BP: (143-149)/(62) 149/62 mmHg (04/05 1000) FiO2 (%):  [50 %-100 %] 70 % (04/06 0500) Weight:  [235 lb 7.2 oz (106.8 kg)] 235 lb 7.2 oz (106.8 kg) (04/06 0230)  Hemodynamic parameters for last 24 hours: CVP:  [14 mmHg-18 mmHg] 14 mmHg  Intake/Output from previous day: 04/05 0701 - 04/06 0700 In: 2420.3 [I.V.:940.3; NG/GT:1350; IV Piggyback:100] Out: 5175 [Urine:4425; Emesis/NG output:350; Stool:400] Intake/Output this shift:   Alert on vent Warm extrem  abd soft   Lab Results:  Recent Labs  11/11/15 0320 11/11/15 1601 11/12/15 0400  WBC 12.8*  --  12.1*  HGB 10.3* 12.9* 11.0*  HCT 33.9* 38.0* 36.2*  PLT 352  --  348   BMET:  Recent Labs  11/11/15 0320 11/11/15 1601 11/12/15 0400  NA 150* 150* 151*  K 3.1* 3.1* 3.0*  CL 95* 95* 100*  CO2 37*  --  35*  GLUCOSE 116* 99 126*  BUN 164* >140* 146*  CREATININE 3.33* 3.10* 2.80*  CALCIUM 9.6  --  9.5    PT/INR:  Recent Labs  11/11/15 0940  LABPROT 17.6*  INR 1.44   ABG    Component Value Date/Time   PHART 7.611* 11/12/2015 0457   HCO3 38.2* 11/12/2015 0457   TCO2 39 11/12/2015 0457   ACIDBASEDEF TEST WILL BE CREDITED 11/01/2015 0446   O2SAT 97.0 11/12/2015 0457   CBG (last 3)   Recent Labs  11/11/15 1919 11/11/15 2343 11/12/15 0354  GLUCAP 122* 113* 113*    Assessment/Plan: S/P Procedure(s) (LRB): Right/Left  Heart Cath and Coronary/Graft Angiography (N/A) Intra-Aortic Balloon Pump Insertion Wean Fio2, vent supp K+   LOS: 23 days    Christopher [name] 11/12/2015

## 2015-11-12 NOTE — Progress Notes (Signed)
Elink MD notified about ABG results and K+ 3.0.   Results for Christopher [name], Christopher [name] (MRN [id number]) as of 11/12/2015 05:11  Ref. Range 11/12/2015 04:51  pH, Arterial Latest Ref Range: 7.350-7.450  7.615 (HH)  pCO2 arterial Latest Ref Range: 35.0-45.0 mmHg 37.9  pO2, Arterial Latest Ref Range: 80.0-100.0 mmHg 74.0 (L)  Bicarbonate Latest Ref Range: 20.0-24.0 mEq/L 38.4 (H)  TCO2 Latest Ref Range: 0-100 mmol/L 40  Acid-Base Excess Latest Ref Range: 0.0-2.0 mmol/L 16.0 (H)  O2 Saturation Latest Units: % 97.0  Patient temperature Unknown 98.6 F

## 2015-11-13 ENCOUNTER — Encounter: Payer: Self-pay | Admitting: Vascular Surgery

## 2015-11-13 ENCOUNTER — Inpatient Hospital Stay (HOSPITAL_COMMUNITY): Payer: BLUE CROSS/BLUE SHIELD

## 2015-11-13 DIAGNOSIS — I484 Atypical atrial flutter: Secondary | ICD-10-CM

## 2015-11-13 LAB — POCT I-STAT 3, ART BLOOD GAS (G3+)
ACID-BASE EXCESS: 10 mmol/L — AB (ref 0.0–2.0)
BICARBONATE: 35.5 meq/L — AB (ref 20.0–24.0)
O2 SAT: 92 %
PH ART: 7.419 (ref 7.350–7.450)
PO2 ART: 68 mmHg — AB (ref 80.0–100.0)
Patient temperature: 99.7
TCO2: 37 mmol/L (ref 0–100)
pCO2 arterial: 55.2 mmHg — ABNORMAL HIGH (ref 35.0–45.0)

## 2015-11-13 LAB — MAGNESIUM: Magnesium: 2.6 mg/dL — ABNORMAL HIGH (ref 1.7–2.4)

## 2015-11-13 LAB — CARBOXYHEMOGLOBIN
CARBOXYHEMOGLOBIN: 1.4 % (ref 0.5–1.5)
Carboxyhemoglobin: 1.7 % — ABNORMAL HIGH (ref 0.5–1.5)
Methemoglobin: 0.7 % (ref 0.0–1.5)
Methemoglobin: 0.7 % (ref 0.0–1.5)
O2 SAT: 86.1 %
O2 Saturation: 65.5 %
Total hemoglobin: 11.6 g/dL — ABNORMAL LOW (ref 13.5–18.0)
Total hemoglobin: 11.9 g/dL — ABNORMAL LOW (ref 13.5–18.0)

## 2015-11-13 LAB — POCT I-STAT, CHEM 8
BUN: 101 mg/dL — ABNORMAL HIGH (ref 6–20)
CALCIUM ION: 1.18 mmol/L (ref 1.12–1.23)
Chloride: 103 mmol/L (ref 101–111)
Creatinine, Ser: 2.2 mg/dL — ABNORMAL HIGH (ref 0.61–1.24)
GLUCOSE: 115 mg/dL — AB (ref 65–99)
HCT: 46 % (ref 39.0–52.0)
HEMOGLOBIN: 15.6 g/dL (ref 13.0–17.0)
Potassium: 4 mmol/L (ref 3.5–5.1)
Sodium: 151 mmol/L — ABNORMAL HIGH (ref 135–145)
TCO2: 31 mmol/L (ref 0–100)

## 2015-11-13 LAB — RENAL FUNCTION PANEL
Albumin: 2.6 g/dL — ABNORMAL LOW (ref 3.5–5.0)
Anion gap: 13 (ref 5–15)
BUN: 115 mg/dL — ABNORMAL HIGH (ref 6–20)
CO2: 34 mmol/L — ABNORMAL HIGH (ref 22–32)
Calcium: 9.3 mg/dL (ref 8.9–10.3)
Chloride: 105 mmol/L (ref 101–111)
Creatinine, Ser: 2.22 mg/dL — ABNORMAL HIGH (ref 0.61–1.24)
GFR calc Af Amer: 37 mL/min — ABNORMAL LOW (ref >60)
GFR calc non Af Amer: 32 mL/min — ABNORMAL LOW (ref >60)
Glucose, Bld: 129 mg/dL — ABNORMAL HIGH (ref 65–99)
Phosphorus: 5.8 mg/dL — ABNORMAL HIGH (ref 2.5–4.6)
Potassium: 3.5 mmol/L (ref 3.5–5.1)
Sodium: 152 mmol/L — ABNORMAL HIGH (ref 135–145)

## 2015-11-13 LAB — CBC
HCT: 36.6 % — ABNORMAL LOW (ref 39.0–52.0)
Hemoglobin: 10.9 g/dL — ABNORMAL LOW (ref 13.0–17.0)
MCH: 28.2 pg (ref 26.0–34.0)
MCHC: 29.8 g/dL — ABNORMAL LOW (ref 30.0–36.0)
MCV: 94.6 fL (ref 78.0–100.0)
Platelets: 337 10*3/uL (ref 150–400)
RBC: 3.87 MIL/uL — ABNORMAL LOW (ref 4.22–5.81)
RDW: 17.8 % — ABNORMAL HIGH (ref 11.5–15.5)
WBC: 10.4 10*3/uL (ref 4.0–10.5)

## 2015-11-13 LAB — GLUCOSE, CAPILLARY
GLUCOSE-CAPILLARY: 111 mg/dL — AB (ref 65–99)
GLUCOSE-CAPILLARY: 113 mg/dL — AB (ref 65–99)
GLUCOSE-CAPILLARY: 93 mg/dL (ref 65–99)
Glucose-Capillary: 125 mg/dL — ABNORMAL HIGH (ref 65–99)
Glucose-Capillary: 147 mg/dL — ABNORMAL HIGH (ref 65–99)
Glucose-Capillary: 113 mg/dL — ABNORMAL HIGH (ref 65–99)

## 2015-11-13 MED ORDER — PANTOPRAZOLE SODIUM 40 MG PO PACK
40.0000 mg | PACK | Freq: Every day | ORAL | Status: DC
  Administered 2015-11-13 – 2015-11-24 (×12): 40 mg
  Filled 2015-11-13 (×13): qty 20

## 2015-11-13 MED ORDER — POTASSIUM CHLORIDE 10 MEQ/50ML IV SOLN
10.0000 meq | INTRAVENOUS | Status: AC
  Administered 2015-11-13 (×2): 10 meq via INTRAVENOUS
  Filled 2015-11-13 (×2): qty 50

## 2015-11-13 MED ORDER — HEPARIN SODIUM (PORCINE) 5000 UNIT/ML IJ SOLN
5000.0000 [IU] | Freq: Three times a day (TID) | INTRAMUSCULAR | Status: DC
  Administered 2015-11-13 (×2): 5000 [IU] via SUBCUTANEOUS
  Filled 2015-11-13 (×2): qty 1

## 2015-11-13 MED ORDER — HEPARIN (PORCINE) IN NACL 100-0.45 UNIT/ML-% IJ SOLN
1000.0000 [IU]/h | INTRAMUSCULAR | Status: DC
  Administered 2015-11-13: 1300 [IU]/h via INTRAVENOUS
  Administered 2015-11-14: 1400 [IU]/h via INTRAVENOUS
  Administered 2015-11-15 – 2015-11-17 (×3): 1000 [IU]/h via INTRAVENOUS
  Filled 2015-11-13 (×9): qty 250

## 2015-11-13 MED ORDER — ASPIRIN 81 MG PO CHEW
81.0000 mg | CHEWABLE_TABLET | Freq: Every day | ORAL | Status: DC
  Administered 2015-11-14 – 2015-11-24 (×11): 81 mg via ORAL
  Filled 2015-11-13 (×11): qty 1

## 2015-11-13 MED ORDER — FREE WATER
300.0000 mL | Status: DC
  Administered 2015-11-13 – 2015-11-15 (×12): 300 mL

## 2015-11-13 NOTE — Progress Notes (Addendum)
[place] for heparin  Indication: atrial fibrillation  Allergies  Allergen Reactions  . Losartan Shortness Of Breath  . Lisinopril Rash    Patient Measurements: Height: 5' 10.5" (179.1 cm) Weight: 220 lb 3.8 oz (99.9 kg) IBW/kg (Calculated) : 74.15 Heparin Dosing Weight: 94kg  Vital Signs: Temp: 99.1 F (37.3 C) (04/07 1500) Temp Source: Core (Comment) (04/07 1500) BP: 124/74 mmHg (04/07 1500) Pulse Rate: 77 (04/07 1500)  Labs:  Recent Labs  11/11/15 0320 11/11/15 0940  11/12/15 0400 11/12/15 2036 11/13/15 0430  HGB 10.3*  --   < > 11.0* 12.6* 10.9*  HCT 33.9*  --   < > 36.2* 37.0* 36.6*  PLT 352  --   --  348  --  337  APTT  --  29  --   --   --   --   LABPROT  --  17.6*  --   --   --   --   INR  --  1.44  --   --   --   --   CREATININE 3.33*  --   < > 2.80* 2.20* 2.22*  < > = values in this interval not displayed.  Estimated Creatinine Clearance: 45.5 mL/min (by C-G formula based on Cr of 2.22).   Medical History: Past Medical History  Diagnosis Date  . Hypertension   . TIA (transient ischemic attack)   . Stenosis of right carotid artery   . Complete heart block ([place]) 10/20/2015    Medications:  Scheduled:  . amiodarone  150 mg Intravenous Once  . antiseptic oral rinse  7 mL Mouth Rinse QID  . [START ON 11/14/2015] aspirin  81 mg Oral Daily  . atorvastatin  80 mg Oral q1800  . chlorhexidine gluconate (SAGE KIT)  15 mL Mouth Rinse BID  . feeding supplement (PRO-STAT SUGAR FREE 64)  30 mL Per Tube TID  . fentaNYL (SUBLIMAZE) injection  25 mcg Intravenous Once  . free water  300 mL Per Tube Q4H  . hydrALAZINE  50 mg Oral 3 times per day  . insulin aspart  0-24 Units Subcutaneous 6 times per day  . insulin detemir  12 Units Subcutaneous BID  . levalbuterol  1.25 mg Nebulization Q6H WA  . levofloxacin (LEVAQUIN) IV  750 mg Intravenous Q48H  . pantoprazole sodium  40 mg Per Tube Daily  . potassium chloride  40 mEq  Oral 3 times per day  . propofol  5-80 mcg/kg/min Intravenous Once  . QUEtiapine  50 mg Oral QHS  . sildenafil  40 mg Oral TID  . silver sulfADIAZINE   Topical BID  . sodium chloride flush  10-40 mL Intracatheter Q12H  . triamcinolone cream   Topical BID  . vancomycin  1,250 mg Intravenous Q48H   Infusions:  . sodium chloride 10 mL/hr at 11/12/15 2000  . amiodarone 30 mg/hr (11/13/15 1400)  . feeding supplement (VITAL HIGH PROTEIN) 1,000 mL (11/12/15 2000)  . fentaNYL infusion INTRAVENOUS 100 mcg/hr (11/13/15 1400)  . norepinephrine (LEVOPHED) Adult infusion Stopped (11/01/15 1930)    Assessment: 55 yo male with afib to begin heparin (no bolus requested). Patient was on heparin previously for IABP (removed 4/2) and then heparin stopped for oozing from th trach site. Hg= 10.9 (has been variable), plt= 337. Heparin sq given at 2:30pm today  -last heparin level was 0.21 with  Heparin rate of 1200 units/hr  Goal of Therapy:  Heparin level 0.3-0.7 units/ml Monitor platelets  by anticoagulation protocol: Yes   Plan:  -Begin heparin at 1300 units/hr -Heparin level in 8 hours and daily wth CBC daily  Hildred Laser, Pharm D 11/13/2015 3:33 PM

## 2015-11-13 NOTE — Progress Notes (Signed)
S: Trached O:BP 109/69 mmHg  Pulse 69  Temp(Src) 97.7 F (36.5 C) (Core (Comment))  Resp 16  Ht 5' 10.5" (1.791 m)  Wt 106.8 kg (235 lb 7.2 oz)  BMI 33.30 kg/m2  SpO2 97%  Intake/Output Summary (Last 24 hours) at 11/13/15 0703 Last data filed at 11/13/15 0400  Gross per 24 hour  Intake 2979.06 ml  Output   4000 ml  Net -1020.94 ml   Weight change:  Gen: sleeping, trached CVS:RRR Resp: Clear ant Abd:+ BS NTND Ext: No edema Rt PICC NEURO:CNI  Follows commands   Lt IJ triple lumen   . amiodarone  150 mg Intravenous Once  . antiseptic oral rinse  7 mL Mouth Rinse QID  . aspirin  325 mg Per Tube Daily  . atorvastatin  80 mg Oral q1800  . bisacodyl  10 mg Oral Daily   Or  . bisacodyl  10 mg Rectal Daily  . chlorhexidine gluconate (SAGE KIT)  15 mL Mouth Rinse BID  . feeding supplement (PRO-STAT SUGAR FREE 64)  30 mL Per Tube TID  . fentaNYL (SUBLIMAZE) injection  25 mcg Intravenous Once  . free water  300 mL Per Tube Q6H  . hydrALAZINE  50 mg Oral 3 times per day  . insulin aspart  0-24 Units Subcutaneous 6 times per day  . insulin detemir  12 Units Subcutaneous BID  . levalbuterol  1.25 mg Nebulization Q6H WA  . levofloxacin (LEVAQUIN) IV  750 mg Intravenous Q48H  . midazolam  1 mg Intravenous Once  . pantoprazole (PROTONIX) IV  40 mg Intravenous Daily  . potassium chloride  40 mEq Oral 3 times per day  . propofol  5-80 mcg/kg/min Intravenous Once  . QUEtiapine  50 mg Oral QHS  . sennosides  5 mL Per Tube BID  . sildenafil  40 mg Oral TID  . silver sulfADIAZINE   Topical BID  . sodium chloride flush  10-40 mL Intracatheter Q12H  . triamcinolone cream   Topical BID  . vancomycin  1,250 mg Intravenous Q48H   Dg Chest Port 1 View  11/12/2015  CLINICAL DATA:  Respiratory failure. EXAM: PORTABLE CHEST 1 VIEW COMPARISON:  11/11/2015. FINDINGS: Tracheostomy tube, feeding tube, left IJ line, right PICC line in stable position . Prior CABG. Cardiomegaly with diffuse  bilateral pulmonary infiltrates consistent pulmonary edema. Progressed from prior exam. Small bilateral pleural effusions. No pneumothorax. IMPRESSION: 1. Lines and tubes in stable position. 2. Prior CABG. Cardiomegaly with progressive bilateral pulmonary infiltrates consistent with pulmonary edema. Small bilateral pleural effusions are noted. Electronically Signed   By: Marcello Moores  Register   On: 11/12/2015 07:46   Dg Chest Port 1 View  11/11/2015  CLINICAL DATA:  Tracheostomy placement EXAM: PORTABLE CHEST 1 VIEW COMPARISON:  11/11/2015 FINDINGS: Tracheostomy tube has been placed with the tip projecting over the mid trachea. No pneumothorax. Right PICC line remains in place, unchanged as does left central line. Cardiomegaly with bilateral perihilar and lower lobe opacities, likely edema. Suspect layering effusions. IMPRESSION: Interval placement tracheostomy tube without pneumothorax. Suspected CHF. Bilateral effusions. Findings similar to prior study. Electronically Signed   By: Rolm Baptise M.D.   On: 11/11/2015 14:50   BMET    Component Value Date/Time   NA 152* 11/13/2015 0430   K 3.5 11/13/2015 0430   CL 105 11/13/2015 0430   CO2 34* 11/13/2015 0430   GLUCOSE 129* 11/13/2015 0430   BUN 115* 11/13/2015 0430   CREATININE 2.22* 11/13/2015 0430  CALCIUM 9.3 11/13/2015 0430   GFRNONAA 32* 11/13/2015 0430   GFRAA 37* 11/13/2015 0430   CBC    Component Value Date/Time   WBC 10.4 11/13/2015 0430   RBC 3.87* 11/13/2015 0430   HGB 10.9* 11/13/2015 0430   HCT 36.6* 11/13/2015 0430   PLT 337 11/13/2015 0430   MCV 94.6 11/13/2015 0430   MCH 28.2 11/13/2015 0430   MCHC 29.8* 11/13/2015 0430   RDW 17.8* 11/13/2015 0430     Assessment:  1. ARF most likely due to ATN, Scr improving 2. SP CABG complicated by VT 3. Hx HTN 4. ? Allergic rxn 5. Hypernatremia, getting free water boluses  Plan: 1. Cont to hold diuretics 2.Renal fx should cont to improve  3. Will sign off, call if further  renal issues Paarth Cropper T

## 2015-11-13 NOTE — Progress Notes (Signed)
Patient ID: Christopher [name], male   DOB: 1960/09/02, 55 y.o.   MRN: [id number]   Mr Fildes is a 54 year old with a history of HTN, TIA, CHB, DMII, and marijuana use. On March 14th he presented to [facility] with increased dyspnea. On arrival he had inferior MI and CHB. Urgently transferred to [facility] for cath. LHC Ramus lesion, 70% stenosed, Prox LAD lesion, 95% stenosed, and prox RCA lesion, 100% stenosed. Developed pulmonary edema post cath and was diuresed IV lasix.   Cardiac Surgery consulted---10/27/15 S/P CABG x4 LIMA to LAD, SVG to DIAGONAL, SVG to RAMUS, INTERMEDIATE, SVG to RDA 3 and R CEA. Extubated 3/22. IABP removed 3/23 and remained on dopamine, milrinone, and epi. 3/23 had VF arrest requiring CPR shock x4. Re-intubated. CXR-infiltrate R lung base. Started on milrinone 0.2 mcg, amio drip, norepinephrine. Nitric oxide restarted at 10 ppm.  He had vfib arrest again 3/24 pm and lidocaine added.  VT 3/26-3/27 overnight again, 8 shocks, procainamide added and no further VT.  Stopped norepinephrine but milrinone up to 0.5 still with marginal co-ox.  In effort to decrease milrinone and decrease drive towards VT, he was taken back to cath lab and IABP placed again.  Milrinone decreased to 0.375 then 0.25.    Pacer now off, no further heart block.    IABP removed 4/2. Trached 4/5. Remains on Milrinone 0.125 mcg. Off diuretics per Nephrology. He was also trached yesterday. Creatinine 3.1>2.8>2.2.  BUN 115. Today CO-OX 65%. Sodium up to 152  ECHO 10/02/15 EF 25-30% RV normal AK entire anteroseptal and inferoseptal myocardium.  TEE 10/27/2015: EF 35-40% RV mildly dilated TR mod regurg.  ECHO 10/30/15: EF 25-30% ECHO 11/06/15: EF 30-35%  LHC/RHC/IABP placement 3/27 Left Anterior Descending  Native left coronary was not injected to save contrast. LIMA-LAD was patent. SVG-small diagonal was patent. There was marked size mismatch between graft and artery with resulting slow flow. There appeared to be about  80% stenosis in the diagonal distal to touchdown of graft.      Ramus Intermedius  Native LCA not injected to save contrast. SVG-ramus patent. There was a size mismatch between large vein graft and smaller caliber native artery. There appeared to be 80% stenosis in the ramus distal to the SVG touchdown and 70% stenosis proximal to SVG touchdown.     Left Circumflex  Not visualized, native LCA not injected to save contrast.     Right Coronary Artery  Native RCA known to be occluded, not injected. SVG-PDA patent.       Right Heart Pressures Procedural Findings: Hemodynamics (mmHg), on milrinone 0.5 RA mean 19 RV 44/23 PA 43/25, mean 33 PCWP mean 26 AO 124/71 Oxygen saturations: PA 64% AO 100% Cardiac Output (Fick) 6.52  Cardiac Index (Fick) 2.86 Cardiac Output (Thermo) 4.97 Cardiac Index (Thermo) 2.18    Scheduled Meds: . amiodarone  150 mg Intravenous Once  . antiseptic oral rinse  7 mL Mouth Rinse QID  . aspirin  325 mg Per Tube Daily  . atorvastatin  80 mg Oral q1800  . bisacodyl  10 mg Oral Daily   Or  . bisacodyl  10 mg Rectal Daily  . chlorhexidine gluconate (SAGE KIT)  15 mL Mouth Rinse BID  . feeding supplement (PRO-STAT SUGAR FREE 64)  30 mL Per Tube TID  . fentaNYL (SUBLIMAZE) injection  25 mcg Intravenous Once  . free water  300 mL Per Tube Q6H  . hydrALAZINE  50 mg Oral 3 times per day  .  insulin aspart  0-24 Units Subcutaneous 6 times per day  . insulin detemir  12 Units Subcutaneous BID  . levalbuterol  1.25 mg Nebulization Q6H WA  . levofloxacin (LEVAQUIN) IV  750 mg Intravenous Q48H  . midazolam  1 mg Intravenous Once  . pantoprazole (PROTONIX) IV  40 mg Intravenous Daily  . potassium chloride  40 mEq Oral 3 times per day  . propofol  5-80 mcg/kg/min Intravenous Once  . QUEtiapine  50 mg Oral QHS  . sennosides  5 mL Per Tube BID  . sildenafil  40 mg Oral TID  . silver sulfADIAZINE   Topical BID  . sodium chloride flush  10-40 mL Intracatheter  Q12H  . triamcinolone cream   Topical BID  . vancomycin  1,250 mg Intravenous Q48H   Continuous Infusions: . sodium chloride 10 mL/hr at 11/12/15 2000  . amiodarone 30 mg/hr (11/12/15 2351)  . feeding supplement (VITAL HIGH PROTEIN) 1,000 mL (11/12/15 2000)  . fentaNYL infusion INTRAVENOUS 250 mcg/hr (11/12/15 2020)  . midazolam (VERSED) infusion 5 mg/hr (11/13/15 0226)  . milrinone 0.125 mcg/kg/min (11/12/15 2006)  . norepinephrine (LEVOPHED) Adult infusion Stopped (11/01/15 1930)   PRN Meds:.diphenhydrAMINE, fentaNYL, midazolam, ondansetron (ZOFRAN) IV, sodium chloride flush, traMADol    Filed Vitals:   11/13/15 0630 11/13/15 0645 11/13/15 0700 11/13/15 0715  BP:   96/68   Pulse: 57 57 77 62  Temp: 97.2 F (36.2 C) 97.2 F (36.2 C) 97.2 F (36.2 C) 97.2 F (36.2 C)  TempSrc:      Resp: 16 15 15 15   Height:      Weight:      SpO2: 96% 97% 98% 97%    Intake/Output Summary (Last 24 hours) at 11/13/15 0726 Last data filed at 11/13/15 0400  Gross per 24 hour  Intake 2979.06 ml  Output   4000 ml  Net -1020.94 ml    LABS: Basic Metabolic Panel:  Recent Labs  11/12/15 0400 11/12/15 2036 11/13/15 0430  NA 151* 152* 152*  K 3.0* 3.3* 3.5  CL 100* 103 105  CO2 35*  --  34*  GLUCOSE 126* 124* 129*  BUN 146* 121* 115*  CREATININE 2.80* 2.20* 2.22*  CALCIUM 9.5  --  9.3  MG 2.5*  --  2.6*  PHOS 4.3  --  5.8*   Liver Function Tests:  Recent Labs  11/10/15 1040  11/12/15 0400 11/13/15 0430  AST 50*  --   --   --   ALT 56  --   --   --   ALKPHOS 79  --   --   --   BILITOT 1.0  --   --   --   PROT 6.8  --   --   --   ALBUMIN 2.8*  < > 2.5* 2.6*  < > = values in this interval not displayed. No results for input(s): LIPASE, AMYLASE in the last 72 hours. CBC:  Recent Labs  11/12/15 0400 11/12/15 2036 11/13/15 0430  WBC 12.1*  --  10.4  HGB 11.0* 12.6* 10.9*  HCT 36.2* 37.0* 36.6*  MCV 91.6  --  94.6  PLT 348  --  337   Cardiac Enzymes: No results  for input(s): CKTOTAL, CKMB, CKMBINDEX, TROPONINI in the last 72 hours. BNP: Invalid input(s): POCBNP D-Dimer: No results for input(s): DDIMER in the last 72 hours. Hemoglobin A1C: No results for input(s): HGBA1C in the last 72 hours. Fasting Lipid Panel: No results for input(s): CHOL, HDL,  LDLCALC, TRIG, CHOLHDL, LDLDIRECT in the last 72 hours. Thyroid Function Tests: No results for input(s): TSH, T4TOTAL, T3FREE, THYROIDAB in the last 72 hours.  Invalid input(s): FREET3 Anemia Panel: No results for input(s): VITAMINB12, FOLATE, FERRITIN, TIBC, IRON, RETICCTPCT in the last 72 hours.  RADIOLOGY: Dg Chest 2 View  10/23/2015  CLINICAL DATA:  CHF.  Weakness. EXAM: CHEST  2 VIEW COMPARISON:  10/21/2015 FINDINGS: Multiple interstitial densities in the lower chest are suggestive for interstitial edema, right side greater than left. Heart size is upper limits of normal. Trachea is midline. Negative for a pneumothorax. Evidence for a small right pleural effusion. IMPRESSION: Interstitial densities in the lower chest, right side greater than left. Findings are suggestive for interstitial pulmonary edema. Difficult to exclude some airspace disease in the right lower lung. Evidence for small right pleural effusion. Electronically Signed   By: Markus Daft M.D.   On: 10/23/2015 08:27   Dg Abd 1 View  11/03/2015  CLINICAL DATA:  Enteric tube advanced to post pyloric position. EXAM: ABDOMEN - 1 VIEW COMPARISON:  11/02/2015 abdominal radiographs. FINDINGS: Fluoroscopy time 7 minutes. Enteric tube courses through the stomach and duodenum and appears to terminate near the duodenal jejunal junction. Instilled contrast opacifies nondilated proximal jejunal small bowel loops. No contrast leak is demonstrated. Separate enteric tube terminates in the body of the stomach. Visualized lower sternotomy wires appear aligned and intact. IMPRESSION: Enteric tube appears to terminate near the duodenal jejunal junction.  Separate enteric tube terminates in the body of the stomach. Electronically Signed   By: Ilona Sorrel M.D.   On: 11/03/2015 17:02   Dg Chest Port 1 View  11/12/2015  CLINICAL DATA:  Respiratory failure. EXAM: PORTABLE CHEST 1 VIEW COMPARISON:  11/11/2015. FINDINGS: Tracheostomy tube, feeding tube, left IJ line, right PICC line in stable position . Prior CABG. Cardiomegaly with diffuse bilateral pulmonary infiltrates consistent pulmonary edema. Progressed from prior exam. Small bilateral pleural effusions. No pneumothorax. IMPRESSION: 1. Lines and tubes in stable position. 2. Prior CABG. Cardiomegaly with progressive bilateral pulmonary infiltrates consistent with pulmonary edema. Small bilateral pleural effusions are noted. Electronically Signed   By: Marcello Moores  Register   On: 11/12/2015 07:46   Dg Chest Port 1 View  11/11/2015  CLINICAL DATA:  Tracheostomy placement EXAM: PORTABLE CHEST 1 VIEW COMPARISON:  11/11/2015 FINDINGS: Tracheostomy tube has been placed with the tip projecting over the mid trachea. No pneumothorax. Right PICC line remains in place, unchanged as does left central line. Cardiomegaly with bilateral perihilar and lower lobe opacities, likely edema. Suspect layering effusions. IMPRESSION: Interval placement tracheostomy tube without pneumothorax. Suspected CHF. Bilateral effusions. Findings similar to prior study. Electronically Signed   By: Rolm Baptise M.D.   On: 11/11/2015 14:50   Dg Chest Port 1 View  11/11/2015  CLINICAL DATA:  Respiratory failure. EXAM: PORTABLE CHEST 1 VIEW COMPARISON:  11/10/2015. FINDINGS: Endotracheal tube, feeding tube, bilateral subclavian lines in stable position. Prior CABG. Cardiomegaly with diffuse bilateral pulmonary infiltrates consistent with pulmonary edema. Worsening from prior exam. Small left pleural effusion. No pneumothorax. IMPRESSION: 1.  Lines and tubes in stable position. 2. Prior CABG. Cardiomegaly with worsening bilateral pulmonary edema. Small  left pleural effusion. Electronically Signed   By: Marcello Moores  Register   On: 11/11/2015 07:17   Dg Chest Port 1 View  11/10/2015  CLINICAL DATA:  Shortness of breath . EXAM: PORTABLE CHEST 1 VIEW COMPARISON:  11/09/2015. FINDINGS: Endotracheal tube, NG tube, right PICC line, left subclavian line in stable  position. Prior CABG. Cardiomegaly. Left lower lobe atelectasis and/or infiltrate with small left pleural effusion. Mild right base atelectasis and or infiltrate. No pneumothorax . IMPRESSION: 1.  Lines and tubes in stable position. 2. Left lower lobe atelectasis and/or infiltrate with small left pleural effusion. Mild right base atelectasis and/or infiltrate. Findings stable from prior exam. 3.  Prior CABG.  Stable cardiomegaly. Electronically Signed   By: Marcello Moores  Register   On: 11/10/2015 07:51   Dg Chest Port 1 View  11/09/2015  CLINICAL DATA:  Atelectasis. EXAM: PORTABLE CHEST 1 VIEW COMPARISON:  11/08/2015 FINDINGS: Endotracheal tube in satisfactory position. Right arm PICC tip in the lower SVC unchanged. Left subclavian central venous catheter in the SVC. NG tube and feeding tube enters the stomach with the tips not visualized. Negative for pneumothorax. Left lower lobe airspace disease unchanged. Small left effusion unchanged. Mild pulmonary vascular congestion unchanged. IMPRESSION: Support lines remain in satisfactory position and unchanged Left lower lobe atelectasis/ infiltrate unchanged. Mild right lower lobe airspace consolidation stable. Mild vascular congestion unchanged. Electronically Signed   By: Franchot Gallo M.D.   On: 11/09/2015 07:36   Dg Chest Port 1 View  11/08/2015  CLINICAL DATA:  Respiratory failure, hypertension, coronary artery disease post MI and CABG EXAM: PORTABLE CHEST 1 VIEW COMPARISON:  Portable exam 1216 hours compared to 11/06/2015 FINDINGS: Tip of endotracheal tube projects 6.8 cm above carina. Nasogastric tube and feeding tube extends into stomach. LEFT subclavian central  venous catheter with tip projecting over SVC. RIGHT arm PICC line tip projects over SVC. Enlargement of cardiac silhouette post CABG. Stable mediastinal contours. Increased LEFT lower lobe consolidation. Slightly increased infiltrate at RIGHT base as well. Minimal central peribronchial thickening. No pleural effusion or pneumothorax. IMPRESSION: Enlargement of cardiac silhouette post CABG. Stable line and tube positions. Increased LEFT lower lobe consolidation with minimally increased infiltrate at RIGHT base as well. Findings are suspicious for pneumonia involving the LEFT lower lobe though a component of coexisting pulmonary edema may be present. Electronically Signed   By: Lavonia Dana M.D.   On: 11/08/2015 12:47   Dg Chest Port 1 View  11/06/2015  CLINICAL DATA:  Acute CHF secondary to acute MI, complete heart block. EXAM: PORTABLE CHEST 1 VIEW COMPARISON:  Portable chest x-ray of November 05, 2015 FINDINGS: The lungs are adequately inflated. The pulmonary interstitial markings are less prominent on the right today. They have improved on the left as well and the hemidiaphragm is now visible. There is persistent interstitial edema on the left. The cardiac silhouette remains enlarged. The intraaortic balloon pump marker tip projects over the proximal portion of descending thoracic aorta and appears stable. The endotracheal tube tip projects 5.5 cm above the carina. The nasogastric and Doppler AH feeding tubes have their tips below the inferior margin of the image. The right-sided PICC line tip projects over the junction of the middle and distal thirds of the SVC. The left subclavian venous catheter tip projects over the midportion of the SVC. IMPRESSION: Decreasing pulmonary edema consistent with improvement in CHF. Persistent perihilar interstitial edema on the left. The support tubes and devices are in reasonable position. Electronically Signed   By: David  Martinique M.D.   On: 11/06/2015 07:32   Dg Chest Port 1  View  11/05/2015  CLINICAL DATA:  Respiratory failure. EXAM: PORTABLE CHEST 1 VIEW COMPARISON:  11/04/2015 . FINDINGS: Endotracheal tube, NG tube, right PICC line stable position. Prior CABG. Cardiomegaly. Persistent left mid and lower lung field infiltrate. Persistent  mild atelectatic changes right upper and right lower lobes. No pneumothorax. IMPRESSION: 1.  Lines and tubes in stable position. 2. Persistent left mid and left lower lung infiltrate, no interim change. Persistent right upper lobe and right lower lobe subsegmental atelectasis. No interim change. 3. Prior CABG.  Stable cardiomegaly. Electronically Signed   By: Marcello Moores  Register   On: 11/05/2015 07:48   Dg Chest Port 1 View  11/04/2015  CLINICAL DATA:  Hypoxia EXAM: PORTABLE CHEST 1 VIEW COMPARISON:  November 04, 2015 FINDINGS: Endotracheal tube tip is 5.3 cm above the carina. Central catheter tip is in the superior vena cava near the cavoatrial junction. Nasogastric tube tip and side port are below the diaphragm. There also is a feeding tube with the tip below the diaphragm. No pneumothorax. There has been removal of a left chest tube. There is airspace consolidation throughout the left mid and lower lung zones, stable. There is mild atelectasis in the right upper lobe, stable. No new opacity is evident. There is stable cardiomegaly. The pulmonary vascularity is normal. No adenopathy is evident. IMPRESSION: Tube and catheter positions as described without pneumothorax. Airspace consolidation throughout the left mid and lower lung zones, stable. Atelectasis right upper lobe, stable. No new opacity. Stable cardiomegaly. Electronically Signed   By: Lowella Grip III M.D.   On: 11/04/2015 14:52   Dg Chest Port 1 View  11/04/2015  CLINICAL DATA:  Status post CABG 10/28/2015. EXAM: PORTABLE CHEST 1 VIEW COMPARISON:  Single view of the chest 11/03/2015 and 11/02/2015. FINDINGS: Support tubes and lines including a left chest tube are unchanged since the  most recent exam. The chest is better expanded today with decreased atelectasis. Airspace opacity in the left mid and lower lung zones persists. There is likely a left pleural effusion. Marked cardiomegaly is seen with only mild vascular congestion noted. IMPRESSION: Increased pulmonary expansion with decreased scattered atelectasis. Left mid and lower lung zone airspace disease could be due to atelectasis or pneumonia. Small left pleural effusion noted. Support tubes and lines projecting good position.  No pneumothorax. Electronically Signed   By: Inge Rise M.D.   On: 11/04/2015 07:30   Dg Chest Port 1 View  11/03/2015  CLINICAL DATA:  Central line placement. EXAM: PORTABLE CHEST 1 VIEW COMPARISON:  11/03/2015. FINDINGS: Left subclavian central line noted with tip at the cavoatrial junction. Interval removal of right chest tube. Endotracheal tube, NG tube, right PICC line, left IJ sheath left chest tube in stable position. No pneumothorax. Prior CABG. Stable cardiomegaly. Low lung volumes. A mild component of congestive heart failure cannot be excluded. Similar findings noted on prior exam P IMPRESSION: 1. Interim placement of left subclavian central line, its tip is at the cavoatrial junction. Interim removal right chest tube . No pneumothorax. 2. Remaining lines and tubes including left chest tube in stable position. 3. Prior CABG. Stable cardiomegaly. Low lung volumes. A a mild component congestive heart failure cannot be excluded . Similar findings noted on prior exam. Electronically Signed   By: [place]   On: 11/03/2015 09:58   Dg Chest Port 1 View  11/03/2015  CLINICAL DATA:  Status post CABG on October 27, 2015 persistent left lower lobe atelectasis or pneumonia. No pneumothorax or significant pleural effusion. EXAM: PORTABLE CHEST 1 VIEW COMPARISON:  Portable chest x-ray of November 02, 2015 at 5:48 a.m. FINDINGS: There has been interval placement of an intra aortic balloon pump whose  marker lies over the junction of the aortic arch  with the proximal descending thoracic aorta. The cardiac silhouette remains enlarged. The pulmonary vascularity remains mildly prominent. The retrocardiac region on the left remains dense. Bilateral chest tubes are in stable position. There is no pneumothorax nor significant pleural effusion. The endotracheal tube tip lies approximately 6.5 cm above the carina. The esophagogastric tube tip projects below the inferior margin of the image. The right-sided PICC line tip projects over the midportion of the SVC. A previously demonstrated pericardial drain is not clearly evident on today's study. IMPRESSION: Mild CHF. Persistent left lower lobe atelectasis or pneumonia. Interval placement of an intra aortic balloon pump which appears to be in appropriate position radiographically. The other support tubes and lines are in reasonable position. Electronically Signed   By: David  Martinique M.D.   On: 11/03/2015 07:27   Dg Chest Port 1 View  11/02/2015  CLINICAL DATA:  CABG. EXAM: PORTABLE CHEST 1 VIEW COMPARISON:  11/01/2015. FINDINGS: Endotracheal tube, NG tube, right PICC line, bilateral chest tubes in stable position. Prior CABG. Stable cardiomegaly . Persistent mild interstitial prominence, congestive heart failure cannot be excluded. No interim change. Low lung volumes with basilar atelectasis . IMPRESSION: 1. Endotracheal tube, NG tube, right PICC line, bilateral chest tubes in stable position . No pneumothorax. 2. Prior CABG. Cardiomegaly with pulmonary interstitial prominence and small left pleural effusion consistent with mild congestive heart failure. No interim change from prior exam . Electronically Signed   By: Marcello Moores  Register   On: 11/02/2015 07:20   Dg Chest Port 1 View  11/01/2015  CLINICAL DATA:  Post CABG EXAM: PORTABLE CHEST 1 VIEW COMPARISON:  10/31/2015 FINDINGS: Cardiomediastinal silhouette is stable. Status post CABG. Stable endotracheal and NG tube  position. Left chest tube is unchanged in position. Persistent central vascular congestion and mild interstitial prominence bilaterally suspicious for the mild interstitial edema. Left IJ sheath in place. Probable small left pleural effusion left basilar atelectasis or infiltrate. Right arm PICC line is unchanged in position. There is no pneumothorax. IMPRESSION: Status post CABG. Stable support apparatus. Stable left chest tube position. No pneumothorax. Again noted bilateral mild interstitial prominence and perihilar opacities suspicious for mild pulmonary edema. Probable small left pleural effusion left basilar atelectasis or infiltrate. Electronically Signed   By: Lahoma Crocker M.D.   On: 11/01/2015 09:57   Dg Chest Port 1 View  10/31/2015  CLINICAL DATA:  55 year old male with a history of endotracheal tube placement Patient has undergone coronary artery bypass grafting x4 10/27/2015. Placement of intra aortic balloon pump at this date. Ischemic cardiomyopathy. EXAM: PORTABLE CHEST 1 VIEW COMPARISON:  10/30/2015. FINDINGS: Endotracheal tube terminates approximately 3.4 cm above the carina, unchanged. Gastric tube terminates out of the field of view. Defibrillator pads project over the left and right chest. Bilateral thoracostomy tubes. Right upper extremity PICC. The mediastinal drain not visualized. Intra-aortic balloon pump not visualized. Surgical changes of median sternotomy and CABG. Mixed bilateral interstitial and airspace opacities. Low lung volumes. IMPRESSION: Low lung volumes with mixed bilateral interstitial and airspace opacities, similar to the comparison chest x-ray. Retrocardiac opacity may reflect persistent pleural fluid, and/or atelectasis. Surgical support apparatus appear unchanged, as above. Intra aortic balloon pump not visualized. Signed, Dulcy Fanny. Earleen Newport, DO [facility] Electronically Signed   By: Corrie Mckusick D.O.   On:  10/31/2015 07:25   Dg Chest Port 1 View  10/30/2015  CLINICAL DATA:  Post cavity.  Recent CABG. EXAM: PORTABLE CHEST 1 VIEW COMPARISON:  Portable film earlier  today. FINDINGS: Overlying support apparatus redemonstrated. Tubes and lines remain stable. Elevated LEFT hemidiaphragm. No definite pneumothorax. Moderate vascular congestion may be increased. Cardiomegaly. IMPRESSION: Slight worsening aeration. Moderate vascular congestion may be increased. Electronically Signed   By: Staci Righter M.D.   On: 10/30/2015 18:39   Dg Chest Port 1 View  10/30/2015  CLINICAL DATA:  Status post coronary bypass grafting EXAM: PORTABLE CHEST 1 VIEW COMPARISON:  10/30/2015 FINDINGS: Cardiac shadow remains enlarged. Bilateral chest tubes are again seen. A right-sided PICC line and endotracheal tube are noted in satisfactory position. Left jugular sheath is noted. Elevation of left hemidiaphragm is noted with left basilar atelectasis. No pneumothorax is noted. IMPRESSION: Postsurgical change with tubes and lines as described. Mild left basilar atelectasis. Electronically Signed   By: Inez Catalina M.D.   On: 10/30/2015 07:45   Dg Chest Port 1 View  10/30/2015  CLINICAL DATA:  A chest tube in place EXAM: PORTABLE CHEST 1 VIEW COMPARISON:  10/30/2015 FINDINGS: Postoperative changes in the mediastinum. Endotracheal tube with tip measuring 6.3 cm about the carina. Right PICC catheter with tip over the cavoatrial junction. Left central venous catheter sheath with tip over the left neck consistent location in the internal jugular vein. Bilateral chest tubes. Infiltration or atelectasis in the right lung base. Cardiac enlargement. No significant vascular congestion. No pneumothorax. IMPRESSION: Appliance positioned as described. Cardiac enlargement with infiltration or atelectasis in the right lung base. Bilateral chest tubes are present but no visualized pneumothorax. Electronically Signed   By: Lucienne Capers M.D.   On: 10/30/2015  03:07   Dg Chest Port 1 View  10/30/2015  CLINICAL DATA:  Shortness of breath.  Code blue. EXAM: PORTABLE CHEST 1 VIEW COMPARISON:  10/29/2015 FINDINGS: Postoperative changes in the mediastinum. Bilateral chest tubes are present. Right PICC line with tip over the cavoatrial junction. Left central venous catheter or sheath with tip over the left side of the neck, likely in the left internal jugular vein. Cardiac enlargement without significant vascular congestion. Shallow inspiration with elevation of the left hemidiaphragm. Probable left pleural effusion. No definite pulmonary consolidation. IMPRESSION: Shallow inspiration. Probable left pleural effusion. Cardiac enlargement. Appliances appear in satisfactory position. Electronically Signed   By: Lucienne Capers M.D.   On: 10/30/2015 01:18   Dg Chest Port 1 View  10/29/2015  CLINICAL DATA:  Postop CABG 2 days ago. EXAM: PORTABLE CHEST 1 VIEW COMPARISON:  Portable chest x-ray of October 28, 2015 FINDINGS: The trachea and esophagus have been extubated. The cardiac silhouette remains enlarged. The pulmonary vascularity is mildly engorged. The bilateral chest tubes and the mediastinal drain are in stable position. There is no pneumothorax or large pleural effusion. The Swan-Ganz catheter tip overlies a proximal right lower lobe pulmonary artery branch. The mediastinum is widened and accentuated by the hypo inflation. IMPRESSION: Bilateral hypoinflation following extubation of the trachea there is crowding of the pulmonary vascularity. Left basilar atelectasis or less likely pneumonia is more prominent today. Probable small left pleural effusion. The remaining support tubes and lines are in stable position. Electronically Signed   By: David  Martinique M.D.   On: 10/29/2015 07:40   Dg Chest Port 1 View  10/28/2015  CLINICAL DATA:  Post CABG, on ventilator, followup portable chest x-ray of 10/28/2015 EXAM: PORTABLE CHEST 1 VIEW COMPARISON:  None. FINDINGS: The tip of  the endotracheal tube is approximately 5.2 cm above the carina. Aeration has improved slightly. Atelectasis remains at the lung bases left-greater-than-right. Swan-Ganz catheter tip is  in the right lower lobe pulmonary artery and bilateral chest tubes remain. No pneumothorax is seen. Heart size is stable. IMPRESSION: 1. Improved aeration. 2. Bilateral chest tubes.  No pneumothorax. 3. Tip of endotracheal tube approximately 5.2 cm above the carina. Electronically Signed   By: Ivar Drape M.D.   On: 10/28/2015 08:01   Dg Chest Port 1 View  10/27/2015  CLINICAL DATA:  Status post CABG EXAM: PORTABLE CHEST 1 VIEW COMPARISON:  10/27/2015 FINDINGS: Unchanged tracheostomy tube. Swan-Ganz central venous line is now seen with tip advanced into the right perihilar region. Stable right chest tube with and left chest tube. NG tube again crosses the gastroesophageal junction. Stable mediastinal drain. Enlarged cardiac silhouette. Limited inspiratory effect with bibasilar opacities likely representing atelectasis. IMPRESSION: Anticipated postoperative appearance with bibasilar atelectasis. Electronically Signed   By: Skipper Cliche M.D.   On: 10/27/2015 19:25   Dg Chest Portable 1 View  10/27/2015  CLINICAL DATA:  Status post coronary bypass grafting EXAM: PORTABLE CHEST 1 VIEW COMPARISON:  10/23/2015 FINDINGS: Cardiac shadow is mildly enlarged. A Swan-Ganz catheter is noted in the right pulmonary outflow tract. Bilateral thoracostomy catheters are seen. No pneumothorax is noted. An endotracheal tube is noted in satisfactory position. Intra-aortic balloon pump is noted over the aortic arch. Mild left basilar atelectasis is seen. Mild central vascular congestion is noted. IMPRESSION: Postoperative changes with tubes and lines as described above. Mild vascular congestion and left basilar atelectasis. Electronically Signed   By: Inez Catalina M.D.   On: 10/27/2015 17:23   Portable Chest X-ray 1 View  10/21/2015  CLINICAL  DATA:  55 year old male with a history of acute systolic congestive heart failure EXAM: PORTABLE CHEST 1 VIEW COMPARISON:  10/20/2015 FINDINGS: Heart size unchanged, enlarged. Similar appearance of low lung volumes with interstitial opacities, interlobular septal thickening, and developing airspace disease of the bilateral lower lungs. No pneumothorax. Linear opacity overlying the mediastinum and the heart border, of on certain significance. Favored to overlie the patient. IMPRESSION: Evidence of worsening congestive heart failure, with increasing edema. New linear opacity overlies the mediastinum, favored to be overlying the patient, though cannot be localized on this view. Signed, Dulcy Fanny. Earleen Newport, DO [facility] Electronically Signed   By: Corrie Mckusick D.O.   On: 10/21/2015 07:16   Dg Chest Portable 1 View  10/20/2015  CLINICAL DATA:  Shortness of breath. EXAM: PORTABLE CHEST 1 VIEW COMPARISON:  None. FINDINGS: Midline trachea. Cardiomegaly accentuated by AP portable technique. No pleural effusion or pneumothorax. Mildly low lung volumes with bibasilar atelectasis. IMPRESSION: Mild cardiomegaly and low lung volumes.  No acute findings. Electronically Signed   By: Abigail Miyamoto M.D.   On: 10/20/2015 18:09   Dg Abd Portable 1v  11/02/2015  CLINICAL DATA:  Assess feeding tube positioning EXAM: PORTABLE ABDOMEN - 1 VIEW COMPARISON:  None in PACs FINDINGS: The repeated KUB with has less motion artifact. The tip of the feeding tube and nasogastric tube lie in the region of the distal gastric body in the pre-pyloric region. The bowel gas pattern is within the limits of normal. Numerous tubes and lines and electrodes overlie the abdomen. IMPRESSION: The tips of the feeding tube and the esophagogastric tube lie in the region of the distal gastric body in the pre-pyloric region. Electronically Signed   By: David  Martinique M.D.   On: 11/02/2015 12:16   Dg Addison Bailey  G Tube Plc W/fl-no Rad  11/03/2015  CLINICAL DATA:  NASO G  TUBE PLACEMENT WITH FLUORO Fluoroscopy was utilized by the requesting physician.  No radiographic interpretation.    PHYSICAL EXAM CVP 14 General: Awake on vent Neck: JVP hard to see.  no thyromegaly or thyroid nodule. Trach  Lungs: Rhonchi through out.   CV: Nondisplaced PMI.  Heart regular S1/S2, no S3/S4, no murmur. Trace ankle edema. Abdomen: Soft, nontender, no hepatosplenomegaly, no distention.  Neurologic: On vent alert and follows commands.  Psych: on vent  Extremities: No clubbing or cyanosis.  Dopplerable pedal pulses. Maculopapular rash on his sides  TELEMETRY: A flutter 70s   ASSESSMENT AND PLAN:  1. CAD: S/p late presentation inferior MI and CABG x 4. Continue statin, ASA. Coronary angiography 3/27 with multiple VT episodes showed patent LIMA-LAD and SVG-RCA.  Patent SVG-ramus and SVG-D but slow flow in both grafts with diffusely diseased small target vessels.  2. Cardiogenic shock: Acute systolic CHF with RV failure post inferior MI with suspected RV involvement. EF 25-30% on echo (difficult images on 3/24 echo). He has significant RV failure from suspected RV infarct.  3/27 IABP placed to try to allow decrease in milrinone with frequent VT.  3/31 echo with EF 30-35%.  IABP out 4/2.   - Today CO-OX is good at 65%. Stop milrinone.   - CVP 14  Off diuretics, auto-diuresing quite well.  Will need to add diuretics soon.  - No spiro/dig with CKD.  - He is now off NO and on sildenafil 40 tid.   -Continue hydralazine to 50 mg tid for afterload reduction (no Imdur with sildenafil use).   3. Complete heart block: Pacemaker turned off 4/3.  Underlying NSR in 70s.  4. VF arrest 3/24 early am and again at night 3/24.  Only occasional PVCs today.  - Continue amiodarone.  5. AKI: Creatinine lower today.  Suspect ATN. Baseline creatinine 1.0. Renal following, given good UOP and apparent lack of uremic symptoms, no RRT yet.   Holding diuretics per nephrology.  6. ID: Possible LLL PNA.  He is on vancomycin/levofloxacin.  7. Anemia: No overt bleeding.  Got 2 units PRBCs on 4/4. Hemoglobin 11>10.9.  8. Pulmonary: Pulmonary edema, LLL PNA.  Failed SBT.  S/P Trach 4/5   9. Hypernatremia: Increase free water boluses. Sodium 152 10. Atrial flutter/coarse fib: Noted on monitor today.  HR controlled.  Continue amiodarone. EKG now. May need to add  anticoagulation.   Amy Clegg NP-C  11/13/2015 7:26 AM   Patient seen with NP, agree with the above note.  He is doing better today.  Remains in atypical atrial flutter but HR controlled in 70s.  CVP around 14, auto-diuresing quite well with weight coming down.  Not currently getting diuretics.  Creatinine and BUN are starting to come down as well.  - Increase free water boluses for hypernatremia. - Add heparin without a bolus for anticoagulation given atrial flutter.  If he does not go back into NSR on amiodarone, will need TEE-guided DCCV next week.  - Agree with stopping milrinone, repeat co-ox this afternoon.   Loralie Champagne 11/13/2015 2:15 PM

## 2015-11-13 NOTE — Progress Notes (Signed)
PULMONARY / CRITICAL CARE MEDICINE   Name: Christopher [name] MRN: [id number] DOB: 1961-03-06    ADMISSION DATE:  10/20/2015 CONSULTATION DATE:  11/03/2015  REFERRING MD: Dr. Donata Clay / CVTS  CHIEF COMPLAINT:  Acute Hypoxic Respiratory Failure  HISTORY OF PRESENT ILLNESS:   [name] w/ ischemic cardiomyopathy following MI. Patient underwent CABG & CEA. Post-operative course complicated by arrhythmia and biventricular failure, HCAP, heart block. Improving  SUBJECTIVE:  Trach oozing improved PEEP 8, Fio2 0.40  VITAL SIGNS: BP 96/68 mmHg  Pulse 62  Temp(Src) 97.2 F (36.2 C) (Core (Comment))  Resp 15  Ht 5' 10.5" (1.791 m)  Wt 99.9 kg (220 lb 3.8 oz)  BMI 31.14 kg/m2  SpO2 97%  HEMODYNAMICS: CVP:  [12 mmHg-23 mmHg] 14 mmHg  VENTILATOR SETTINGS: Vent Mode:  [-] PRVC FiO2 (%):  [40 %-60 %] 40 % Set Rate:  [12 bmp] 12 bmp Vt Set:  [590 mL] 590 mL PEEP:  [5 cmH20-8 cmH20] 5 cmH20 Plateau Pressure:  [18 cmH20-27 cmH20] 20 cmH20  INTAKE / OUTPUT: I/O last 3 completed shifts: In: 4458.8 [I.V.:1388.8; NG/GT:2970; IV Piggyback:100] Out: 6425 [Urine:6025; Stool:400]  PHYSICAL EXAMINATION: General:  Comfortable. Sedated.  Neuro:  Sedated.  Pupils equal.  Easily arousable and interactive. HEENT:  No scleral icterus or injection. Trach in place Cardiovascular:  Irregular, no M  Lungs:  Coarse breath sounds bilaterally. Symmetric chest rise on ventilator. Abdomen:  Soft. Protuberant. Normal BS. Integument:  Warm & dry. No rash on exposed skin.  LABS:  BMET  Recent Labs Lab 11/11/15 0320  11/12/15 0400 11/12/15 2036 11/13/15 0430  NA 150*  < > 151* 152* 152*  K 3.1*  < > 3.0* 3.3* 3.5  CL 95*  < > 100* 103 105  CO2 37*  --  35*  --  34*  BUN 164*  < > 146* 121* 115*  CREATININE 3.33*  < > 2.80* 2.20* 2.22*  GLUCOSE 116*  < > 126* 124* 129*  < > = values in this interval not displayed. Electrolytes  Recent Labs Lab 11/11/15 0320 11/12/15 0400  11/13/15 0430  CALCIUM 9.6 9.5 9.3  MG 2.8* 2.5* 2.6*  PHOS 5.9*  5.7* 4.3 5.8*   CBC  Recent Labs Lab 11/11/15 0320  11/12/15 0400 11/12/15 2036 11/13/15 0430  WBC 12.8*  --  12.1*  --  10.4  HGB 10.3*  < > 11.0* 12.6* 10.9*  HCT 33.9*  < > 36.2* 37.0* 36.6*  PLT 352  --  348  --  337  < > = values in this interval not displayed. Coag's  Recent Labs Lab 11/11/15 0940  APTT 29  INR 1.44    Sepsis Markers No results for input(s): LATICACIDVEN, PROCALCITON, O2SATVEN in the last 168 hours. ABG  Recent Labs Lab 11/12/15 0451 11/12/15 0457 11/13/15 0526  PHART 7.615* 7.611* 7.419  PCO2ART 37.9 37.9 55.2*  PO2ART 74.0* 74.0* 68.0*    Liver Enzymes  Recent Labs Lab 11/09/15 0345 11/10/15 0330 11/10/15 1040 11/11/15 0320 11/12/15 0400 11/13/15 0430  AST 49* 47* 50*  --   --   --   ALT 52 52 56  --   --   --   ALKPHOS 89 75 79  --   --   --   BILITOT 1.1 0.6 1.0  --   --   --   ALBUMIN 2.7* 2.5* 2.8* 2.8* 2.5* 2.6*    Cardiac Enzymes No results for input(s): TROPONINI, PROBNP in  the last 168 hours.  Glucose  Recent Labs Lab 11/12/15 0749 11/12/15 1108 11/12/15 1550 11/12/15 1917 11/13/15 0007 11/13/15 0346  GLUCAP 116* 129* 116* 110* 111* 113*   Imaging Dg Chest Port 1 View  11/13/2015  CLINICAL DATA:  Shortness of breath. EXAM: PORTABLE CHEST 1 VIEW COMPARISON:  November 12, 2015. FINDINGS: Stable cardiomegaly. Status post coronary bypass graft. Tracheostomy and feeding tube are unchanged. Bilateral subclavian catheters are unchanged. No pneumothorax is noted. Stable bibasilar opacities are noted with left greater than right. Stable left pleural effusion is noted. Bony thorax is unremarkable. IMPRESSION: Stable support apparatus. Stable bibasilar opacities. Stable left pleural effusion. Electronically Signed   By: Lupita Raider, M.D.   On: 11/13/2015 07:37    STUDIES:  Port CXR 3/28:  Previously reviewed by me. R PICC in good psition. ETT >5cm  above carina. New L [place] CVL. L lung opacity unchanged. Low lung volumes. L chest tube in place. Port CXR 3/30:  Personally reviewed by me.  MICROBIOLOGY: Tracheal Asp Ctx 3/27:  Oral Flora Urine Ctx 3/24:  Negative MRSA PCR 3/20:  Negative MRSA PCR 3/14:  Negative  ANTIBIOTICS: Zosyn 3/29>>> 4/1 Vancomcyin 3/21 - 3/22; 3/29>>> 4/7 Cefuroxime 3/21 - 3/23 (periop prophylaxis) Elita Quick 3/27 - 3/29 levaquin 4/1 >> 4/7  SIGNIFICANT EVENTS: 3/14 - Admit 3/14 - LHC 3/21 - CABG 3/27 - IABP replaced >> 4/1 3/28 - Chest Tubes removed. RHC & LHC.  LINES/TUBES: OETT 7.5 Extubated 3/22; 3.24>>> 4/5 Trach (JY) 4/5 >>  L [place] CVL 3/28>>> R PICC 3/23>>> IABP 3/27>>> 4/1  ASSESSMENT / PLAN:  PULMONARY A: Acute Hypoxic Respiratory Failure Chronic Hypercarbic Respiratory Failure - OSA/OHS, COPD. Chronic hypoxemic resp failure - cardiogenic pulmonary edema  P:   PRVC, 8cc/kg, RR 12, 0.40 + PEEP 8 >> plan decrease PEEP to 5  Volume negative  Continue Xopenex q6hr   CARDIOVASCULAR A:  S/P CABG & CEA Left Ventricular Heart Failure post MI Cardiogenic Shock Periop Complete Heart Block & Post-op V Tach H/O HTN P:  CVTS & Cardiology Managing ASA VT Amiodarone gtt Milrinone gtt to off on 4/7 Lasix gtt held Levophed off Transitioned from Inh NO to sildenifil , now off NO  RENAL Lab Results  Component Value Date   CREATININE 2.22* 11/13/2015   CREATININE 2.20* 11/12/2015   CREATININE 2.80* 11/12/2015    Recent Labs Lab 11/12/15 0400 11/12/15 2036 11/13/15 0430  K 3.0* 3.3* 3.5  A:   Acute Renal Failure, non-oliguric now in auto-diuresis phase Hypoklalemia Hypernatremia due to aggresive diuresis  P:   Lasix gtt held, auto-diuresing Trending UOP with Foley Electrolytes & Renal Function daily Replacing electrolytes as indicated Free water >> need to increase to keep up with his losses, to 300cc q4h on 4/7  GASTROINTESTINAL A:   Transaminitis - Likely shock  liver/hepatic congestion. Constipation P:   Continuing TFs Protonix IV daily Dulcolax PR prn Senna bid  HEMATOLOGIC  Recent Labs  11/12/15 2036 11/13/15 0430  HGB 12.6* 10.9*   A:   Anemia - Hgb worsening. No signs of active bleeding. P:  Transfusion PRBC as needed Heparin gtt Trending cell counts daily w/ CBC  INFECTIOUS A:   Possible HCAP - Ctx w/ Oral Flora. P:   Abx as above; recommend day 10 of 10 empiric abx for most recent suspected HCAP; vanco and Levaquin end tonight 4/7 Plan to re-culture for fever  ENDOCRINE CBG (last 3)   Recent Labs  11/12/15 1917 11/13/15 0007 11/13/15  0346  GLUCAP 110* 111* 113*   A:   Hyperglycemia - BG controlled.  P:   Continue Levemir bid Accu-checks q4hr SSI per algorithm  NEUROLOGIC A:   Sedation on Ventilator Post-op Pain Control  P:   RASS goal: 0 Goal lighten his sedation now that trach in place Attempt to d/c versed gtt 4/7 Fentanyl gtt & IV prn  FAMILY UPDATES: Discussed 4/5, not present 4/7  Independent CC time 35 minutes   Levy Pupa, MD, PhD 11/13/2015, 8:39 AM [facility] [phone] or if no answer [phone]

## 2015-11-13 NOTE — Progress Notes (Signed)
11 Days Post-Op Procedure(s) (LRB): Right/Left Heart Cath and Coronary/Graft Angiography (N/A) Intra-Aortic Balloon Pump Insertion Subjective: Continues to improve Now participating in pressure support weaning Renal function continues to improve Hematocrit stable Maintaining sinus rhythm with some interrupted atrial fibrillation  Objective: Vital signs in last 24 hours: Temp:  [97.2 F (36.2 C)-99.5 F (37.5 C)] 99.5 F (37.5 C) (04/07 1600) Pulse Rate:  [51-129] 78 (04/07 1704) Cardiac Rhythm:  [-] Atrial fibrillation (04/07 1600) Resp:  [11-25] 25 (04/07 1704) BP: (96-147)/(63-86) 119/78 mmHg (04/07 1704) SpO2:  [89 %-100 %] 96 % (04/07 1704) FiO2 (%):  [40 %] 40 % (04/07 1704) Weight:  [220 lb 3.8 oz (99.9 kg)] 220 lb 3.8 oz (99.9 kg) (04/07 0500)  Hemodynamic parameters for last 24 hours: CVP:  [11 mmHg-23 mmHg] 16 mmHg  Intake/Output from previous day: 04/06 0701 - 04/07 0700 In: 3252.1 [I.V.:892.1; NG/GT:2310; IV Piggyback:50] Out: 4375 [Urine:4175; Stool:200] Intake/Output this shift: Total I/O In: 813 [I.V.:313; NG/GT:500] Out: 820 [Urine:800; Stool:20]  Alert and responsive Lungs clear Extremities warm  Lab Results:  Recent Labs  11/12/15 0400 11/12/15 2036 11/13/15 0430  WBC 12.1*  --  10.4  HGB 11.0* 12.6* 10.9*  HCT 36.2* 37.0* 36.6*  PLT 348  --  337   BMET:  Recent Labs  11/12/15 0400 11/12/15 2036 11/13/15 0430  NA 151* 152* 152*  K 3.0* 3.3* 3.5  CL 100* 103 105  CO2 35*  --  34*  GLUCOSE 126* 124* 129*  BUN 146* 121* 115*  CREATININE 2.80* 2.20* 2.22*  CALCIUM 9.5  --  9.3    PT/INR:  Recent Labs  11/11/15 0940  LABPROT 17.6*  INR 1.44   ABG    Component Value Date/Time   PHART 7.419 11/13/2015 0526   HCO3 35.5* 11/13/2015 0526   TCO2 37 11/13/2015 0526   ACIDBASEDEF TEST WILL BE CREDITED 11/01/2015 0446   O2SAT 86.1 11/13/2015 1435   CBG (last 3)   Recent Labs  11/13/15 0750 11/13/15 1146 11/13/15 1529  GLUCAP  125* 147* 93    Assessment/Plan: S/P Procedure(s) (LRB): Right/Left Heart Cath and Coronary/Graft Angiography (N/A) Intra-Aortic Balloon Pump Insertion Continue to hold Lasix Continue tube feeds Complete 10 days total IV antibiotics for probable ventilator associated pneumonia Continue for vent weaning   LOS: 24 days    Kathlee Nations Trigt III 11/13/2015

## 2015-11-13 NOTE — Progress Notes (Signed)
Versed order Dc'd this am.  1ml/25mg  left in bag.  Wasted down sink Fatima Sanger RN and Dierdre Highman RN.

## 2015-11-14 ENCOUNTER — Inpatient Hospital Stay (HOSPITAL_COMMUNITY): Payer: BLUE CROSS/BLUE SHIELD

## 2015-11-14 DIAGNOSIS — J449 Chronic obstructive pulmonary disease, unspecified: Secondary | ICD-10-CM | POA: Insufficient documentation

## 2015-11-14 DIAGNOSIS — N179 Acute kidney failure, unspecified: Secondary | ICD-10-CM | POA: Insufficient documentation

## 2015-11-14 DIAGNOSIS — J9601 Acute respiratory failure with hypoxia: Secondary | ICD-10-CM | POA: Insufficient documentation

## 2015-11-14 DIAGNOSIS — I483 Typical atrial flutter: Secondary | ICD-10-CM

## 2015-11-14 LAB — BASIC METABOLIC PANEL
Anion gap: 14 (ref 5–15)
BUN: 102 mg/dL — ABNORMAL HIGH (ref 6–20)
CO2: 31 mmol/L (ref 22–32)
Calcium: 8.9 mg/dL (ref 8.9–10.3)
Chloride: 107 mmol/L (ref 101–111)
Creatinine, Ser: 1.99 mg/dL — ABNORMAL HIGH (ref 0.61–1.24)
GFR calc Af Amer: 42 mL/min — ABNORMAL LOW (ref >60)
GFR calc non Af Amer: 36 mL/min — ABNORMAL LOW (ref >60)
Glucose, Bld: 124 mg/dL — ABNORMAL HIGH (ref 65–99)
Potassium: 3.8 mmol/L (ref 3.5–5.1)
Sodium: 152 mmol/L — ABNORMAL HIGH (ref 135–145)

## 2015-11-14 LAB — CARBOXYHEMOGLOBIN
Carboxyhemoglobin: 1.3 % (ref 0.5–1.5)
Methemoglobin: 0.8 % (ref 0.0–1.5)
O2 Saturation: 68.4 %
Total hemoglobin: 11.4 g/dL — ABNORMAL LOW (ref 13.5–18.0)

## 2015-11-14 LAB — GLUCOSE, CAPILLARY
GLUCOSE-CAPILLARY: 131 mg/dL — AB (ref 65–99)
GLUCOSE-CAPILLARY: 131 mg/dL — AB (ref 65–99)
GLUCOSE-CAPILLARY: 141 mg/dL — AB (ref 65–99)
Glucose-Capillary: 123 mg/dL — ABNORMAL HIGH (ref 65–99)
Glucose-Capillary: 131 mg/dL — ABNORMAL HIGH (ref 65–99)
Glucose-Capillary: 108 mg/dL — ABNORMAL HIGH (ref 65–99)

## 2015-11-14 LAB — POCT I-STAT, CHEM 8
BUN: 104 mg/dL — AB (ref 6–20)
CREATININE: 2.2 mg/dL — AB (ref 0.61–1.24)
Calcium, Ion: 1.2 mmol/L (ref 1.12–1.23)
Chloride: 109 mmol/L (ref 101–111)
GLUCOSE: 157 mg/dL — AB (ref 65–99)
HEMATOCRIT: 39 % (ref 39.0–52.0)
HEMOGLOBIN: 13.3 g/dL (ref 13.0–17.0)
Potassium: 4.6 mmol/L (ref 3.5–5.1)
Sodium: 152 mmol/L — ABNORMAL HIGH (ref 135–145)
TCO2: 28 mmol/L (ref 0–100)

## 2015-11-14 LAB — HEPARIN LEVEL (UNFRACTIONATED)
Heparin Unfractionated: 0.29 IU/mL — ABNORMAL LOW (ref 0.30–0.70)
Heparin Unfractionated: 0.5 IU/mL (ref 0.30–0.70)

## 2015-11-14 LAB — MAGNESIUM: Magnesium: 2.4 mg/dL (ref 1.7–2.4)

## 2015-11-14 MED ORDER — FUROSEMIDE 10 MG/ML IJ SOLN
40.0000 mg | Freq: Every day | INTRAMUSCULAR | Status: DC
  Administered 2015-11-14 – 2015-11-17 (×4): 40 mg via INTRAVENOUS
  Filled 2015-11-14 (×4): qty 4

## 2015-11-14 MED ORDER — METOCLOPRAMIDE HCL 5 MG/ML IJ SOLN
10.0000 mg | Freq: Once | INTRAMUSCULAR | Status: AC
Start: 2015-11-14 — End: 2015-11-14
  Administered 2015-11-14: 10 mg via INTRAVENOUS
  Filled 2015-11-14: qty 2

## 2015-11-14 MED ORDER — POTASSIUM CHLORIDE 10 MEQ/50ML IV SOLN
10.0000 meq | INTRAVENOUS | Status: AC
  Administered 2015-11-14 (×2): 10 meq via INTRAVENOUS
  Filled 2015-11-14 (×2): qty 50

## 2015-11-14 MED ORDER — FUROSEMIDE 10 MG/ML IJ SOLN: 40.0000 mg | Freq: Once | INTRAMUSCULAR | Status: DC

## 2015-11-14 NOTE — Progress Notes (Signed)
CT surgery p.m. Rounds  Patient tolerated trach collar for a few hours Waiting for physical therapy to assist out of bed to chair Continue with trach collar tomorrow as tolerated 1 episode of vomiting today-check x-ray chest and abdomen in a.m. Hold tube feeds for 2 hours then resume

## 2015-11-14 NOTE — Progress Notes (Signed)
RT note- moderate amount of secretions post bath, placed back to full support.

## 2015-11-14 NOTE — Progress Notes (Signed)
ANTICOAGULATION CONSULT NOTE - Follow Up Consult  Pharmacy Consult for heparin Indication: atrial fibrillation   Labs:  Recent Labs  11/11/15 0320 11/11/15 0940  11/12/15 0400 11/12/15 2036 11/13/15 0430 11/13/15 1924 11/14/15 0010  HGB 10.3*  --   < > 11.0* 12.6* 10.9* 15.6  --   HCT 33.9*  --   < > 36.2* 37.0* 36.6* 46.0  --   PLT 352  --   --  348  --  337  --   --   APTT  --  29  --   --   --   --   --   --   LABPROT  --  17.6*  --   --   --   --   --   --   INR  --  1.44  --   --   --   --   --   --   HEPARINUNFRC  --   --   --   --   --   --   --  0.29*  CREATININE 3.33*  --   < > 2.80* 2.20* 2.22* 2.20*  --   < > = values in this interval not displayed.   Assessment: 55yo male slightly subtherapeutic on heparin after resumed for Afib; per RN no signs of bleeding noted.  Goal of Therapy:  Heparin level 0.3-0.7 units/ml   Plan:  Will increase heparin gtt slightly to 1400 units/hr and check level in 8hr.  Vernard Gambles, PharmD, BCPS  11/14/2015,1:16 AM

## 2015-11-14 NOTE — Progress Notes (Signed)
PULMONARY / CRITICAL CARE MEDICINE   Name: Christopher [name] MRN: [id number] DOB: September 13, 1960    ADMISSION DATE:  10/20/2015 CONSULTATION DATE:  11/03/2015  REFERRING MD: Dr. Donata Clay / CVTS  CHIEF COMPLAINT:  Acute Hypoxic Respiratory Failure  HISTORY OF PRESENT ILLNESS:   [name] w/ ischemic cardiomyopathy following MI. Patient underwent CABG & CEA. Post-operative course complicated by arrhythmia and biventricular failure, HCAP, heart block. Improving  SUBJECTIVE: Patient has been on T-collar since earlier this morning. Denies any chest pain or difficulty breathing at this time.   REVIEW OF SYSTEMS:  Denies any nausea. No abdominal pain. Unable to obtain complete review of systems with tracheostomy in place.  VITAL SIGNS: BP 115/69 mmHg  Pulse 69  Temp(Src) 98.2 F (36.8 C) (Oral)  Resp 17  Ht 5' 10.5" (1.791 m)  Wt 230 lb 9.6 oz (104.6 kg)  BMI 32.61 kg/m2  SpO2 98%  HEMODYNAMICS: CVP:  [15 mmHg-22 mmHg] 18 mmHg  VENTILATOR SETTINGS: Vent Mode:  [-] PSV;CPAP FiO2 (%):  [40 %-60 %] 60 % Set Rate:  [12 bmp] 12 bmp Vt Set:  [590 mL] 590 mL PEEP:  [8 cmH20] 8 cmH20 Pressure Support:  [8 cmH20] 8 cmH20 Plateau Pressure:  [15 cmH20-22 cmH20] 22 cmH20  INTAKE / OUTPUT: I/O last 3 completed shifts: In: 5566.3 [I.V.:1281.3; Other:45; NG/GT:4090; IV Piggyback:150] Out: 5415 [Urine:4995; Stool:420]  PHYSICAL EXAMINATION: General:  Comfortable. Awake. Significant other at bedside. Neuro:  Follows commands. Moving all 4 extremities. Grossly nonfocal. HEENT:  No scleral icterus or injection. Trach in place.  Cardiovascular:  Regular rate. No appreciable JVD.  Lungs:  Coarse breath sounds on auscultation bilaterally. Normal work of breathing on [employer]. Abdomen:  Soft. Protuberant. Normal BS. Nontender. Integument:  Warm & dry. No rash on exposed skin.  LABS:  BMET  Recent Labs Lab 11/12/15 0400  11/13/15 0430 11/13/15 1924 11/14/15 0415  NA 151*  < > 152*  151* 152*  K 3.0*  < > 3.5 4.0 3.8  CL 100*  < > 105 103 107  CO2 35*  --  34*  --  31  BUN 146*  < > 115* 101* 102*  CREATININE 2.80*  < > 2.22* 2.20* 1.99*  GLUCOSE 126*  < > 129* 115* 124*  < > = values in this interval not displayed. Electrolytes  Recent Labs Lab 11/11/15 0320 11/12/15 0400 11/13/15 0430 11/14/15 0415  CALCIUM 9.6 9.5 9.3 8.9  MG 2.8* 2.5* 2.6* 2.4  PHOS 5.9*  5.7* 4.3 5.8*  --    CBC  Recent Labs Lab 11/11/15 0320  11/12/15 0400 11/12/15 2036 11/13/15 0430 11/13/15 1924  WBC 12.8*  --  12.1*  --  10.4  --   HGB 10.3*  < > 11.0* 12.6* 10.9* 15.6  HCT 33.9*  < > 36.2* 37.0* 36.6* 46.0  PLT 352  --  348  --  337  --   < > = values in this interval not displayed. Coag's  Recent Labs Lab 11/11/15 0940  APTT 29  INR 1.44    Sepsis Markers No results for input(s): LATICACIDVEN, PROCALCITON, O2SATVEN in the last 168 hours. ABG  Recent Labs Lab 11/12/15 0451 11/12/15 0457 11/13/15 0526  PHART 7.615* 7.611* 7.419  PCO2ART 37.9 37.9 55.2*  PO2ART 74.0* 74.0* 68.0*    Liver Enzymes  Recent Labs Lab 11/09/15 0345 11/10/15 0330 11/10/15 1040 11/11/15 0320 11/12/15 0400 11/13/15 0430  AST 49* 47* 50*  --   --   --  ALT 52 52 56  --   --   --   ALKPHOS 89 75 79  --   --   --   BILITOT 1.1 0.6 1.0  --   --   --   ALBUMIN 2.7* 2.5* 2.8* 2.8* 2.5* 2.6*    Cardiac Enzymes No results for input(s): TROPONINI, PROBNP in the last 168 hours.  Glucose  Recent Labs Lab 11/13/15 1146 11/13/15 1529 11/13/15 1916 11/14/15 0003 11/14/15 0322 11/14/15 0733  GLUCAP 147* 93 113* 131* 123* 141*   Imaging Dg Chest Port 1 View  11/14/2015  CLINICAL DATA:  55 year old male with a history of shortness of breath. Patient admission 10/20/2015. Right carotid endarterectomy 10/27/2015, with same-day coronary artery bypass grafting 10/27/2015. Twelve days postop now with history of intra-aortic balloon pump. EXAM: PORTABLE CHEST 1 VIEW COMPARISON:   11/13/2015, 11/12/2015, 11/11/2015 FINDINGS: Cardiomediastinal silhouette likely unchanged, with the left heart border partially obscured by overlying lung and pleural disease. Low lung volumes persist with bibasilar opacities and obscuration of the retrocardiac region. Obscuration of left hemidiaphragm. Unchanged tracheostomy tube, enteric feeding tube terminating out of the field of view, right upper extremity PICC and left subclavian central line. IMPRESSION: Similar appearance of the chest x-ray, with persisting bibasilar airspace opacities and left pleural effusion. Unchanged support apparatus as above. Signed, Yvone Neu. Loreta Ave, DO [facility] Radiology Electronically Signed   By: Gilmer Mor D.O.   On: 11/14/2015 08:48    STUDIES:  Port CXR 3/28:  Previously reviewed by me. R PICC in good psition. ETT >5cm above carina. New L [place] CVL. L lung opacity unchanged. Low lung volumes. L chest tube in place. Port CXR 4/8:  Personally reviewed by me. Left basilar opacity unchanged. Trach & lines in place & unchanged.   MICROBIOLOGY: Tracheal Asp Ctx 3/27:  Oral Flora Urine Ctx 3/24:  Negative MRSA PCR 3/20:  Negative MRSA PCR 3/14:  Negative  ANTIBIOTICS: Zosyn 3/29 - 4/1 Vancomcyin 3/21 - 3/22; 3/29 - 4/7 Cefuroxime 3/21 - 3/23 (periop prophylaxis) Elita Quick 3/27 - 3/29 Levaquin 4/1 - 4/7  SIGNIFICANT EVENTS: 3/14 - Admit 3/14 - LHC 3/21 - CABG 3/27 - IABP replaced >> 4/1 3/28 - Chest Tubes removed. RHC & LHC.  LINES/TUBES: Trach 6.0 (JY) 4/5 >>  L [place] CVL 3/28>>> R PICC 3/23>>> R NJT>>> Foley>>> IABP 3/27 - 4/1 OETT 7.5 Extubated 3/22; 3.24 - 4/5  ASSESSMENT / PLAN:  PULMONARY A: Acute Hypoxic Respiratory Failure - Improving. Chronic Hypercarbic Respiratory Failure - OSA/OHS, COPD. Chronic hypoxemic resp failure - cardiogenic pulmonary edema  P:   Continuing Trach Collar Trials for 4 hours Rest on Full Vent Support for 4  hours Diuresis per Cardiology Continue Xopenex q6hr while awake  CARDIOVASCULAR A:  S/P CABG & CEA Left Ventricular Heart Failure post MI Cardiogenic Shock Periop Complete Heart Block & Post-op V Tach H/O HTN  P:  CVTS & Cardiology Managing ASA VT Lipitor VT Amiodarone gtt Heparin gtt Revatio VT tid Lasix IV daily  RENAL A:   Acute Renal Failure - Resolving. Hypoklalemia - Resolved. Hypernatremia - Stable. Secondary to aggressive diuresis.  P:   Lasix IV daily Trending UOP with Foley Electrolytes & Renal Function daily Replacing electrolytes as indicated Free Water VT q4hr  GASTROINTESTINAL A:   Transaminitis - Likely shock liver/hepatic congestion. Constipation - Resolved.  P:   Continuing TFs Protonix VT daily Dulcolax PR prn  HEMATOLOGIC A:   Anemia - Hgb stable.  No signs of active bleeding.  P:  Transfusion PRBC as needed Heparin gtt Trending cell counts daily w/ CBC  INFECTIOUS A:   Possible HCAP - Ctx w/ Oral Flora.  P:   Completed 10 day course of antibiotics Plan to re-culture for fever  ENDOCRINE CBG (last 3)  A:   Hyperglycemia - BG controlled.  P:   Continue Levemir bid Accu-checks q4hr SSI per algorithm  NEUROLOGIC A:   Sedation on Ventilator Post-op Pain Control   P:   RASS goal: 0 Versed IV prn Fentanyl gtt & IV prn Seroquel qhs  FAMILY UPDATES: Discussed 4/5, not present 4/7  TODAY'S SUMMARY:  55 year old male s/p CABG & Right CEA with complicated post-op course. Patient's renal function slowly improving. Tolerating tracheostomy collar trials. Plan to rest patient intermittently on ventilator. Further diuresis & management per Cardiology & CVTS.  I have spent a total of 33 minutes of critical care time today caring for the patient & reviewing the patient's electronic medical record.  Donna Christen Jamison Neighbor, M.D. [facility] Pulmonary & Critical Care Pager:  [phone] After 3pm or if no response, call [phone]   11/14/2015, 1:03 PM

## 2015-11-14 NOTE — Progress Notes (Signed)
ANTICOAGULATION CONSULT NOTE - Follow Up Consult  Pharmacy Consult for Heparin Indication: atrial fibrillation  Allergies  Allergen Reactions  . Losartan Shortness Of Breath  . Lisinopril Rash    Patient Measurements: Height: 5' 10.5" (179.1 cm) Weight: 230 lb 9.6 oz (104.6 kg) IBW/kg (Calculated) : 74.15 Heparin Dosing Weight: 94kg  Vital Signs: Temp: 98.7 F (37.1 C) (04/08 0736) Temp Source: Oral (04/08 0736) BP: 115/69 mmHg (04/08 0700) Pulse Rate: 68 (04/08 0700)  Labs:  Recent Labs  11/12/15 0400 11/12/15 2036 11/13/15 0430 11/13/15 1924 11/14/15 0010 11/14/15 0415 11/14/15 0930  HGB 11.0* 12.6* 10.9* 15.6  --   --   --   HCT 36.2* 37.0* 36.6* 46.0  --   --   --   PLT 348  --  337  --   --   --   --   HEPARINUNFRC  --   --   --   --  0.29*  --  0.50  CREATININE 2.80* 2.20* 2.22* 2.20*  --  1.99*  --     Estimated Creatinine Clearance: 51.9 mL/min (by C-G formula based on Cr of 1.99).   Medications:  Heparin @ 1400 units/hr  Assessment: 55yom started on IV heparin yesterday for aflutter. Heparin level is therapeutic at 0.51. No bleeding reported. May need TEE/DCCV next week.  Goal of Therapy:  Heparin level 0.3-0.7 units/ml Monitor platelets by anticoagulation protocol: Yes   Plan:  1) Continue heparin at 1400 units/hr 2) Daily heparin level, CBC  Fredrik Rigger 11/14/2015,10:24 AM

## 2015-11-14 NOTE — Progress Notes (Signed)
12 Days Post-Op Procedure(s) (LRB): Right/Left Heart Cath and Coronary/Graft Angiography (N/A) Intra-Aortic Balloon Pump Insertion Subjective: Co-ox 68% off milrinone Controlled atrial flutter on IV amiodarone-IV heparin resumed for possible DC cardioversion Weight up, chest x-ray wet-IV Lasix 40 mg ordered Tolerating trach collar Needs physical therapy to help mobilize out of bed Off antibiotics now remaining afebrile with normal white count  Objective: Vital signs in last 24 hours: Temp:  [98.2 F (36.8 C)-99.7 F (37.6 C)] 98.2 F (36.8 C) (04/08 1128) Pulse Rate:  [51-78] 69 (04/08 1134) Cardiac Rhythm:  [-] Atrial fibrillation;Atrial flutter (04/07 2330) Resp:  [11-26] 17 (04/08 1134) BP: (106-135)/(66-87) 115/69 mmHg (04/08 0700) SpO2:  [92 %-100 %] 98 % (04/08 1134) FiO2 (%):  [40 %-60 %] 60 % (04/08 1134) Weight:  [230 lb 9.6 oz (104.6 kg)] 230 lb 9.6 oz (104.6 kg) (04/08 0400)  Hemodynamic parameters for last 24 hours: CVP:  [15 mmHg-22 mmHg] 17 mmHg  Intake/Output from previous day: 04/07 0701 - 04/08 0700 In: 4137.6 [I.V.:782.6; NG/GT:3160; IV Piggyback:150] Out: 3405 [Urine:3085; Stool:320] Intake/Output this shift: Total I/O In: 757.8 [I.V.:167.8; NG/GT:590] Out: 430 [Urine:430]  Comfortable on trach collar Coarse breath sounds Mild edema Abdomen soft  Lab Results:  Recent Labs  11/12/15 0400  11/13/15 0430 11/13/15 1924  WBC 12.1*  --  10.4  --   HGB 11.0*  < > 10.9* 15.6  HCT 36.2*  < > 36.6* 46.0  PLT 348  --  337  --   < > = values in this interval not displayed. BMET:  Recent Labs  11/13/15 0430 11/13/15 1924 11/14/15 0415  NA 152* 151* 152*  K 3.5 4.0 3.8  CL 105 103 107  CO2 34*  --  31  GLUCOSE 129* 115* 124*  BUN 115* 101* 102*  CREATININE 2.22* 2.20* 1.99*  CALCIUM 9.3  --  8.9    PT/INR: No results for input(s): LABPROT, INR in the last 72 hours. ABG    Component Value Date/Time   PHART 7.419 11/13/2015 0526   HCO3  35.5* 11/13/2015 0526   TCO2 31 11/13/2015 1924   ACIDBASEDEF TEST WILL BE CREDITED 11/01/2015 0446   O2SAT 68.4 11/14/2015 0425   CBG (last 3)   Recent Labs  11/14/15 0003 11/14/15 0322 11/14/15 0733  GLUCAP 131* 123* 141*    Assessment/Plan: S/P Procedure(s) (LRB): Right/Left Heart Cath and Coronary/Graft Angiography (N/A) Intra-Aortic Balloon Pump Insertion Continue ventilator wean IV Lasix today Swallow study after patient tolerates trach collar with cuff deflated for at least 48 hours   LOS: 25 days    Christopher [name] 11/14/2015

## 2015-11-14 NOTE — Progress Notes (Signed)
Patient ID: Christopher [name], male   DOB: 05-Feb-1961, 55 y.o.   MRN: [id number]   ADVANCED HF CLINIC NOTE  Mr Swanger is a 55 year old with a history of HTN, TIA, CHB, DMII, and marijuana use. On March 14th he presented to [facility] with increased dyspnea. On arrival he had inferior MI and CHB. Urgently transferred to [facility] for cath. LHC Ramus lesion, 70% stenosed, Prox LAD lesion, 95% stenosed, and prox RCA lesion, 100% stenosed. Developed pulmonary edema post cath and was diuresed IV lasix.   Cardiac Surgery consulted---10/27/15 S/P CABG x4 LIMA to LAD, SVG to DIAGONAL, SVG to RAMUS, INTERMEDIATE, SVG to RDA 3 and R CEA. Extubated 3/22. IABP removed 3/23 and remained on dopamine, milrinone, and epi. 3/23 had VF arrest requiring CPR shock x4. Re-intubated. CXR-infiltrate R lung base. Started on milrinone 0.2 mcg, amio drip, norepinephrine. Nitric oxide restarted at 10 ppm.  He had vfib arrest again 3/24 pm and lidocaine added.  VT 3/26-3/27 overnight again, 8 shocks, procainamide added and no further VT.  Stopped norepinephrine but milrinone up to 0.5 still with marginal co-ox.  In effort to decrease milrinone and decrease drive towards VT, he was taken back to cath lab and IABP placed again.  Milrinone decreased to 0.375 then 0.25.    Pacer now off, no further heart block.    IABP removed 4/2. Trached 4/5. Milrinone stopped 4/7   CXR wet. Received 25m IV lasix today by Dr. VPrescott Gum Cr improving 2.20 -> 1.99 Na remains high 151->152. Off milrinone co-ox 68% No VT  On amio and heparin for AFL  ECHO 10/02/15 EF 25-30% RV normal AK entire anteroseptal and inferoseptal myocardium.  TEE 10/27/2015: EF 35-40% RV mildly dilated TR mod regurg.  ECHO 10/30/15: EF 25-30% ECHO 11/06/15: EF 30-35%  LHC/RHC/IABP placement 3/27 Left Anterior Descending  Native left coronary was not injected to save contrast. LIMA-LAD was patent. SVG-small diagonal was patent. There was marked size mismatch between graft  and artery with resulting slow flow. There appeared to be about 80% stenosis in the diagonal distal to touchdown of graft.      Ramus Intermedius  Native LCA not injected to save contrast. SVG-ramus patent. There was a size mismatch between large vein graft and smaller caliber native artery. There appeared to be 80% stenosis in the ramus distal to the SVG touchdown and 70% stenosis proximal to SVG touchdown.     Left Circumflex  Not visualized, native LCA not injected to save contrast.     Right Coronary Artery  Native RCA known to be occluded, not injected. SVG-PDA patent.       Right Heart Pressures Procedural Findings: Hemodynamics (mmHg), on milrinone 0.5 RA mean 19 RV 44/23 PA 43/25, mean 33 PCWP mean 26 AO 124/71 Oxygen saturations: PA 64% AO 100% Cardiac Output (Fick) 6.52  Cardiac Index (Fick) 2.86 Cardiac Output (Thermo) 4.97 Cardiac Index (Thermo) 2.18    Scheduled Meds: . amiodarone  150 mg Intravenous Once  . antiseptic oral rinse  7 mL Mouth Rinse QID  . aspirin  81 mg Oral Daily  . atorvastatin  80 mg Oral q1800  . chlorhexidine gluconate (SAGE KIT)  15 mL Mouth Rinse BID  . feeding supplement (PRO-STAT SUGAR FREE 64)  30 mL Per Tube TID  . fentaNYL (SUBLIMAZE) injection  25 mcg Intravenous Once  . free water  300 mL Per Tube Q4H  . furosemide  40 mg Intravenous Daily  . hydrALAZINE  50 mg  Oral 3 times per day  . insulin aspart  0-24 Units Subcutaneous 6 times per day  . insulin detemir  12 Units Subcutaneous BID  . levalbuterol  1.25 mg Nebulization Q6H WA  . pantoprazole sodium  40 mg Per Tube Daily  . potassium chloride  10 mEq Intravenous Q1 Hr x 2  . potassium chloride  40 mEq Oral 3 times per day  . propofol  5-80 mcg/kg/min Intravenous Once  . QUEtiapine  50 mg Oral QHS  . sildenafil  40 mg Oral TID  . silver sulfADIAZINE   Topical BID  . sodium chloride flush  10-40 mL Intracatheter Q12H  . triamcinolone cream   Topical BID   Continuous  Infusions: . sodium chloride 10 mL/hr at 11/14/15 0000  . amiodarone 30 mg/hr (11/14/15 0000)  . feeding supplement (VITAL HIGH PROTEIN) 1,000 mL (11/14/15 0919)  . fentaNYL infusion INTRAVENOUS 50 mcg/hr (11/14/15 0800)  . heparin 1,400 Units/hr (11/14/15 0811)  . norepinephrine (LEVOPHED) Adult infusion Stopped (11/01/15 1930)   PRN Meds:.diphenhydrAMINE, fentaNYL, midazolam, ondansetron (ZOFRAN) IV, sodium chloride flush, traMADol    Filed Vitals:   11/14/15 0745 11/14/15 0800 11/14/15 1128 11/14/15 1134  BP:      Pulse:    69  Temp:   98.2 F (36.8 C)   TempSrc:   Oral   Resp:    17  Height:      Weight:      SpO2: 99% 98%  98%    Intake/Output Summary (Last 24 hours) at 11/14/15 1217 Last data filed at 11/14/15 1100  Gross per 24 hour  Intake 4201.9 ml  Output   3415 ml  Net  786.9 ml    LABS: Basic Metabolic Panel:  Recent Labs  11/12/15 0400  11/13/15 0430 11/13/15 1924 11/14/15 0415  NA 151*  < > 152* 151* 152*  K 3.0*  < > 3.5 4.0 3.8  CL 100*  < > 105 103 107  CO2 35*  --  34*  --  31  GLUCOSE 126*  < > 129* 115* 124*  BUN 146*  < > 115* 101* 102*  CREATININE 2.80*  < > 2.22* 2.20* 1.99*  CALCIUM 9.5  --  9.3  --  8.9  MG 2.5*  --  2.6*  --  2.4  PHOS 4.3  --  5.8*  --   --   < > = values in this interval not displayed. Liver Function Tests:  Recent Labs  11/12/15 0400 11/13/15 0430  ALBUMIN 2.5* 2.6*   No results for input(s): LIPASE, AMYLASE in the last 72 hours. CBC:  Recent Labs  11/12/15 0400  11/13/15 0430 11/13/15 1924  WBC 12.1*  --  10.4  --   HGB 11.0*  < > 10.9* 15.6  HCT 36.2*  < > 36.6* 46.0  MCV 91.6  --  94.6  --   PLT 348  --  337  --   < > = values in this interval not displayed. Cardiac Enzymes: No results for input(s): CKTOTAL, CKMB, CKMBINDEX, TROPONINI in the last 72 hours. BNP: Invalid input(s): POCBNP D-Dimer: No results for input(s): DDIMER in the last 72 hours. Hemoglobin A1C: No results for input(s):  HGBA1C in the last 72 hours. Fasting Lipid Panel: No results for input(s): CHOL, HDL, LDLCALC, TRIG, CHOLHDL, LDLDIRECT in the last 72 hours. Thyroid Function Tests: No results for input(s): TSH, T4TOTAL, T3FREE, THYROIDAB in the last 72 hours.  Invalid input(s): FREET3 Anemia Panel:  No results for input(s): VITAMINB12, FOLATE, FERRITIN, TIBC, IRON, RETICCTPCT in the last 72 hours.  RADIOLOGY: Dg Chest 2 View  10/23/2015  CLINICAL DATA:  CHF.  Weakness. EXAM: CHEST  2 VIEW COMPARISON:  10/21/2015 FINDINGS: Multiple interstitial densities in the lower chest are suggestive for interstitial edema, right side greater than left. Heart size is upper limits of normal. Trachea is midline. Negative for a pneumothorax. Evidence for a small right pleural effusion. IMPRESSION: Interstitial densities in the lower chest, right side greater than left. Findings are suggestive for interstitial pulmonary edema. Difficult to exclude some airspace disease in the right lower lung. Evidence for small right pleural effusion. Electronically Signed   By: Markus Daft M.D.   On: 10/23/2015 08:27   Dg Abd 1 View  11/03/2015  CLINICAL DATA:  Enteric tube advanced to post pyloric position. EXAM: ABDOMEN - 1 VIEW COMPARISON:  11/02/2015 abdominal radiographs. FINDINGS: Fluoroscopy time 7 minutes. Enteric tube courses through the stomach and duodenum and appears to terminate near the duodenal jejunal junction. Instilled contrast opacifies nondilated proximal jejunal small bowel loops. No contrast leak is demonstrated. Separate enteric tube terminates in the body of the stomach. Visualized lower sternotomy wires appear aligned and intact. IMPRESSION: Enteric tube appears to terminate near the duodenal jejunal junction. Separate enteric tube terminates in the body of the stomach. Electronically Signed   By: Ilona Sorrel M.D.   On: 11/03/2015 17:02   Dg Chest Port 1 View  11/14/2015  CLINICAL DATA:  55 year old male with a history of  shortness of breath. Patient admission 10/20/2015. Right carotid endarterectomy 10/27/2015, with same-day coronary artery bypass grafting 10/27/2015. Twelve days postop now with history of intra-aortic balloon pump. EXAM: PORTABLE CHEST 1 VIEW COMPARISON:  11/13/2015, 11/12/2015, 11/11/2015 FINDINGS: Cardiomediastinal silhouette likely unchanged, with the left heart border partially obscured by overlying lung and pleural disease. Low lung volumes persist with bibasilar opacities and obscuration of the retrocardiac region. Obscuration of left hemidiaphragm. Unchanged tracheostomy tube, enteric feeding tube terminating out of the field of view, right upper extremity PICC and left subclavian central line. IMPRESSION: Similar appearance of the chest x-ray, with persisting bibasilar airspace opacities and left pleural effusion. Unchanged support apparatus as above. Signed, Dulcy Fanny. Earleen Newport, DO [facility] Electronically Signed   By: Corrie Mckusick D.O.   On: 11/14/2015 08:48   Dg Chest Port 1 View  11/13/2015  CLINICAL DATA:  Shortness of breath. EXAM: PORTABLE CHEST 1 VIEW COMPARISON:  November 12, 2015. FINDINGS: Stable cardiomegaly. Status post coronary bypass graft. Tracheostomy and feeding tube are unchanged. Bilateral subclavian catheters are unchanged. No pneumothorax is noted. Stable bibasilar opacities are noted with left greater than right. Stable left pleural effusion is noted. Bony thorax is unremarkable. IMPRESSION: Stable support apparatus. Stable bibasilar opacities. Stable left pleural effusion. Electronically Signed   By: Marijo Conception, M.D.   On: 11/13/2015 07:37   Dg Chest Port 1 View  11/12/2015  CLINICAL DATA:  Respiratory failure. EXAM: PORTABLE CHEST 1 VIEW COMPARISON:  11/11/2015. FINDINGS: Tracheostomy tube, feeding tube, left IJ line, right PICC line in stable position . Prior CABG. Cardiomegaly with diffuse bilateral pulmonary  infiltrates consistent pulmonary edema. Progressed from prior exam. Small bilateral pleural effusions. No pneumothorax. IMPRESSION: 1. Lines and tubes in stable position. 2. Prior CABG. Cardiomegaly with progressive bilateral pulmonary infiltrates consistent with pulmonary edema. Small bilateral pleural effusions are noted. Electronically Signed   By: Marcello Moores  Register   On: 11/12/2015  07:46   Dg Chest Port 1 View  11/11/2015  CLINICAL DATA:  Tracheostomy placement EXAM: PORTABLE CHEST 1 VIEW COMPARISON:  11/11/2015 FINDINGS: Tracheostomy tube has been placed with the tip projecting over the mid trachea. No pneumothorax. Right PICC line remains in place, unchanged as does left central line. Cardiomegaly with bilateral perihilar and lower lobe opacities, likely edema. Suspect layering effusions. IMPRESSION: Interval placement tracheostomy tube without pneumothorax. Suspected CHF. Bilateral effusions. Findings similar to prior study. Electronically Signed   By: Rolm Baptise M.D.   On: 11/11/2015 14:50   Dg Chest Port 1 View  11/11/2015  CLINICAL DATA:  Respiratory failure. EXAM: PORTABLE CHEST 1 VIEW COMPARISON:  11/10/2015. FINDINGS: Endotracheal tube, feeding tube, bilateral subclavian lines in stable position. Prior CABG. Cardiomegaly with diffuse bilateral pulmonary infiltrates consistent with pulmonary edema. Worsening from prior exam. Small left pleural effusion. No pneumothorax. IMPRESSION: 1.  Lines and tubes in stable position. 2. Prior CABG. Cardiomegaly with worsening bilateral pulmonary edema. Small left pleural effusion. Electronically Signed   By: Marcello Moores  Register   On: 11/11/2015 07:17   Dg Chest Port 1 View  11/10/2015  CLINICAL DATA:  Shortness of breath . EXAM: PORTABLE CHEST 1 VIEW COMPARISON:  11/09/2015. FINDINGS: Endotracheal tube, NG tube, right PICC line, left subclavian line in stable position. Prior CABG. Cardiomegaly. Left lower lobe atelectasis and/or infiltrate with small left pleural  effusion. Mild right base atelectasis and or infiltrate. No pneumothorax . IMPRESSION: 1.  Lines and tubes in stable position. 2. Left lower lobe atelectasis and/or infiltrate with small left pleural effusion. Mild right base atelectasis and/or infiltrate. Findings stable from prior exam. 3.  Prior CABG.  Stable cardiomegaly. Electronically Signed   By: Marcello Moores  Register   On: 11/10/2015 07:51   Dg Chest Port 1 View  11/09/2015  CLINICAL DATA:  Atelectasis. EXAM: PORTABLE CHEST 1 VIEW COMPARISON:  11/08/2015 FINDINGS: Endotracheal tube in satisfactory position. Right arm PICC tip in the lower SVC unchanged. Left subclavian central venous catheter in the SVC. NG tube and feeding tube enters the stomach with the tips not visualized. Negative for pneumothorax. Left lower lobe airspace disease unchanged. Small left effusion unchanged. Mild pulmonary vascular congestion unchanged. IMPRESSION: Support lines remain in satisfactory position and unchanged Left lower lobe atelectasis/ infiltrate unchanged. Mild right lower lobe airspace consolidation stable. Mild vascular congestion unchanged. Electronically Signed   By: Franchot Gallo M.D.   On: 11/09/2015 07:36   Dg Chest Port 1 View  11/08/2015  CLINICAL DATA:  Respiratory failure, hypertension, coronary artery disease post MI and CABG EXAM: PORTABLE CHEST 1 VIEW COMPARISON:  Portable exam 1216 hours compared to 11/06/2015 FINDINGS: Tip of endotracheal tube projects 6.8 cm above carina. Nasogastric tube and feeding tube extends into stomach. LEFT subclavian central venous catheter with tip projecting over SVC. RIGHT arm PICC line tip projects over SVC. Enlargement of cardiac silhouette post CABG. Stable mediastinal contours. Increased LEFT lower lobe consolidation. Slightly increased infiltrate at RIGHT base as well. Minimal central peribronchial thickening. No pleural effusion or pneumothorax. IMPRESSION: Enlargement of cardiac silhouette post CABG. Stable line and  tube positions. Increased LEFT lower lobe consolidation with minimally increased infiltrate at RIGHT base as well. Findings are suspicious for pneumonia involving the LEFT lower lobe though a component of coexisting pulmonary edema may be present. Electronically Signed   By: Lavonia Dana M.D.   On: 11/08/2015 12:47   Dg Chest Port 1 View  11/06/2015  CLINICAL DATA:  Acute CHF secondary to  acute MI, complete heart block. EXAM: PORTABLE CHEST 1 VIEW COMPARISON:  Portable chest x-ray of November 05, 2015 FINDINGS: The lungs are adequately inflated. The pulmonary interstitial markings are less prominent on the right today. They have improved on the left as well and the hemidiaphragm is now visible. There is persistent interstitial edema on the left. The cardiac silhouette remains enlarged. The intraaortic balloon pump marker tip projects over the proximal portion of descending thoracic aorta and appears stable. The endotracheal tube tip projects 5.5 cm above the carina. The nasogastric and Doppler AH feeding tubes have their tips below the inferior margin of the image. The right-sided PICC line tip projects over the junction of the middle and distal thirds of the SVC. The left subclavian venous catheter tip projects over the midportion of the SVC. IMPRESSION: Decreasing pulmonary edema consistent with improvement in CHF. Persistent perihilar interstitial edema on the left. The support tubes and devices are in reasonable position. Electronically Signed   By: David  Martinique M.D.   On: 11/06/2015 07:32   Dg Chest Port 1 View  11/05/2015  CLINICAL DATA:  Respiratory failure. EXAM: PORTABLE CHEST 1 VIEW COMPARISON:  11/04/2015 . FINDINGS: Endotracheal tube, NG tube, right PICC line stable position. Prior CABG. Cardiomegaly. Persistent left mid and lower lung field infiltrate. Persistent mild atelectatic changes right upper and right lower lobes. No pneumothorax. IMPRESSION: 1.  Lines and tubes in stable position. 2.  Persistent left mid and left lower lung infiltrate, no interim change. Persistent right upper lobe and right lower lobe subsegmental atelectasis. No interim change. 3. Prior CABG.  Stable cardiomegaly. Electronically Signed   By: Marcello Moores  Register   On: 11/05/2015 07:48   Dg Chest Port 1 View  11/04/2015  CLINICAL DATA:  Hypoxia EXAM: PORTABLE CHEST 1 VIEW COMPARISON:  November 04, 2015 FINDINGS: Endotracheal tube tip is 5.3 cm above the carina. Central catheter tip is in the superior vena cava near the cavoatrial junction. Nasogastric tube tip and side port are below the diaphragm. There also is a feeding tube with the tip below the diaphragm. No pneumothorax. There has been removal of a left chest tube. There is airspace consolidation throughout the left mid and lower lung zones, stable. There is mild atelectasis in the right upper lobe, stable. No new opacity is evident. There is stable cardiomegaly. The pulmonary vascularity is normal. No adenopathy is evident. IMPRESSION: Tube and catheter positions as described without pneumothorax. Airspace consolidation throughout the left mid and lower lung zones, stable. Atelectasis right upper lobe, stable. No new opacity. Stable cardiomegaly. Electronically Signed   By: Lowella Grip III M.D.   On: 11/04/2015 14:52   Dg Chest Port 1 View  11/04/2015  CLINICAL DATA:  Status post CABG 10/28/2015. EXAM: PORTABLE CHEST 1 VIEW COMPARISON:  Single view of the chest 11/03/2015 and 11/02/2015. FINDINGS: Support tubes and lines including a left chest tube are unchanged since the most recent exam. The chest is better expanded today with decreased atelectasis. Airspace opacity in the left mid and lower lung zones persists. There is likely a left pleural effusion. Marked cardiomegaly is seen with only mild vascular congestion noted. IMPRESSION: Increased pulmonary expansion with decreased scattered atelectasis. Left mid and lower lung zone airspace disease could be due to  atelectasis or pneumonia. Small left pleural effusion noted. Support tubes and lines projecting good position.  No pneumothorax. Electronically Signed   By: Inge Rise M.D.   On: 11/04/2015 07:30   Dg Chest Port 1  View  11/03/2015  CLINICAL DATA:  Central line placement. EXAM: PORTABLE CHEST 1 VIEW COMPARISON:  11/03/2015. FINDINGS: Left subclavian central line noted with tip at the cavoatrial junction. Interval removal of right chest tube. Endotracheal tube, NG tube, right PICC line, left IJ sheath left chest tube in stable position. No pneumothorax. Prior CABG. Stable cardiomegaly. Low lung volumes. A mild component of congestive heart failure cannot be excluded. Similar findings noted on prior exam P IMPRESSION: 1. Interim placement of left subclavian central line, its tip is at the cavoatrial junction. Interim removal right chest tube . No pneumothorax. 2. Remaining lines and tubes including left chest tube in stable position. 3. Prior CABG. Stable cardiomegaly. Low lung volumes. A a mild component congestive heart failure cannot be excluded . Similar findings noted on prior exam. Electronically Signed   By: [place]   On: 11/03/2015 09:58   Dg Chest Port 1 View  11/03/2015  CLINICAL DATA:  Status post CABG on October 27, 2015 persistent left lower lobe atelectasis or pneumonia. No pneumothorax or significant pleural effusion. EXAM: PORTABLE CHEST 1 VIEW COMPARISON:  Portable chest x-ray of November 02, 2015 at 5:48 a.m. FINDINGS: There has been interval placement of an intra aortic balloon pump whose marker lies over the junction of the aortic arch with the proximal descending thoracic aorta. The cardiac silhouette remains enlarged. The pulmonary vascularity remains mildly prominent. The retrocardiac region on the left remains dense. Bilateral chest tubes are in stable position. There is no pneumothorax nor significant pleural effusion. The endotracheal tube tip lies approximately 6.5 cm above  the carina. The esophagogastric tube tip projects below the inferior margin of the image. The right-sided PICC line tip projects over the midportion of the SVC. A previously demonstrated pericardial drain is not clearly evident on today's study. IMPRESSION: Mild CHF. Persistent left lower lobe atelectasis or pneumonia. Interval placement of an intra aortic balloon pump which appears to be in appropriate position radiographically. The other support tubes and lines are in reasonable position. Electronically Signed   By: David  Martinique M.D.   On: 11/03/2015 07:27   Dg Chest Port 1 View  11/02/2015  CLINICAL DATA:  CABG. EXAM: PORTABLE CHEST 1 VIEW COMPARISON:  11/01/2015. FINDINGS: Endotracheal tube, NG tube, right PICC line, bilateral chest tubes in stable position. Prior CABG. Stable cardiomegaly . Persistent mild interstitial prominence, congestive heart failure cannot be excluded. No interim change. Low lung volumes with basilar atelectasis . IMPRESSION: 1. Endotracheal tube, NG tube, right PICC line, bilateral chest tubes in stable position . No pneumothorax. 2. Prior CABG. Cardiomegaly with pulmonary interstitial prominence and small left pleural effusion consistent with mild congestive heart failure. No interim change from prior exam . Electronically Signed   By: Marcello Moores  Register   On: 11/02/2015 07:20   Dg Chest Port 1 View  11/01/2015  CLINICAL DATA:  Post CABG EXAM: PORTABLE CHEST 1 VIEW COMPARISON:  10/31/2015 FINDINGS: Cardiomediastinal silhouette is stable. Status post CABG. Stable endotracheal and NG tube position. Left chest tube is unchanged in position. Persistent central vascular congestion and mild interstitial prominence bilaterally suspicious for the mild interstitial edema. Left IJ sheath in place. Probable small left pleural effusion left basilar atelectasis or infiltrate. Right arm PICC line is unchanged in position. There is no pneumothorax. IMPRESSION: Status post CABG. Stable support  apparatus. Stable left chest tube position. No pneumothorax. Again noted bilateral mild interstitial prominence and perihilar opacities suspicious for mild pulmonary edema. Probable small left pleural effusion  left basilar atelectasis or infiltrate. Electronically Signed   By: Lahoma Crocker M.D.   On: 11/01/2015 09:57   Dg Chest Port 1 View  10/31/2015  CLINICAL DATA:  55 year old male with a history of endotracheal tube placement Patient has undergone coronary artery bypass grafting x4 10/27/2015. Placement of intra aortic balloon pump at this date. Ischemic cardiomyopathy. EXAM: PORTABLE CHEST 1 VIEW COMPARISON:  10/30/2015. FINDINGS: Endotracheal tube terminates approximately 3.4 cm above the carina, unchanged. Gastric tube terminates out of the field of view. Defibrillator pads project over the left and right chest. Bilateral thoracostomy tubes. Right upper extremity PICC. The mediastinal drain not visualized. Intra-aortic balloon pump not visualized. Surgical changes of median sternotomy and CABG. Mixed bilateral interstitial and airspace opacities. Low lung volumes. IMPRESSION: Low lung volumes with mixed bilateral interstitial and airspace opacities, similar to the comparison chest x-ray. Retrocardiac opacity may reflect persistent pleural fluid, and/or atelectasis. Surgical support apparatus appear unchanged, as above. Intra aortic balloon pump not visualized. Signed, Dulcy Fanny. Earleen Newport, DO [facility] Electronically Signed   By: Corrie Mckusick D.O.   On: 10/31/2015 07:25   Dg Chest Port 1 View  10/30/2015  CLINICAL DATA:  Post cavity.  Recent CABG. EXAM: PORTABLE CHEST 1 VIEW COMPARISON:  Portable film earlier today. FINDINGS: Overlying support apparatus redemonstrated. Tubes and lines remain stable. Elevated LEFT hemidiaphragm. No definite pneumothorax. Moderate vascular congestion may be increased. Cardiomegaly. IMPRESSION: Slight worsening  aeration. Moderate vascular congestion may be increased. Electronically Signed   By: Staci Righter M.D.   On: 10/30/2015 18:39   Dg Chest Port 1 View  10/30/2015  CLINICAL DATA:  Status post coronary bypass grafting EXAM: PORTABLE CHEST 1 VIEW COMPARISON:  10/30/2015 FINDINGS: Cardiac shadow remains enlarged. Bilateral chest tubes are again seen. A right-sided PICC line and endotracheal tube are noted in satisfactory position. Left jugular sheath is noted. Elevation of left hemidiaphragm is noted with left basilar atelectasis. No pneumothorax is noted. IMPRESSION: Postsurgical change with tubes and lines as described. Mild left basilar atelectasis. Electronically Signed   By: Inez Catalina M.D.   On: 10/30/2015 07:45   Dg Chest Port 1 View  10/30/2015  CLINICAL DATA:  A chest tube in place EXAM: PORTABLE CHEST 1 VIEW COMPARISON:  10/30/2015 FINDINGS: Postoperative changes in the mediastinum. Endotracheal tube with tip measuring 6.3 cm about the carina. Right PICC catheter with tip over the cavoatrial junction. Left central venous catheter sheath with tip over the left neck consistent location in the internal jugular vein. Bilateral chest tubes. Infiltration or atelectasis in the right lung base. Cardiac enlargement. No significant vascular congestion. No pneumothorax. IMPRESSION: Appliance positioned as described. Cardiac enlargement with infiltration or atelectasis in the right lung base. Bilateral chest tubes are present but no visualized pneumothorax. Electronically Signed   By: Lucienne Capers M.D.   On: 10/30/2015 03:07   Dg Chest Port 1 View  10/30/2015  CLINICAL DATA:  Shortness of breath.  Code blue. EXAM: PORTABLE CHEST 1 VIEW COMPARISON:  10/29/2015 FINDINGS: Postoperative changes in the mediastinum. Bilateral chest tubes are present. Right PICC line with tip over the cavoatrial junction. Left central venous catheter or sheath with tip over the left side of the neck, likely in the left internal  jugular vein. Cardiac enlargement without significant vascular congestion. Shallow inspiration with elevation of the left hemidiaphragm. Probable left pleural effusion. No definite pulmonary consolidation. IMPRESSION: Shallow inspiration. Probable left pleural effusion. Cardiac enlargement. Appliances appear in satisfactory  position. Electronically Signed   By: Lucienne Capers M.D.   On: 10/30/2015 01:18   Dg Chest Port 1 View  10/29/2015  CLINICAL DATA:  Postop CABG 2 days ago. EXAM: PORTABLE CHEST 1 VIEW COMPARISON:  Portable chest x-ray of October 28, 2015 FINDINGS: The trachea and esophagus have been extubated. The cardiac silhouette remains enlarged. The pulmonary vascularity is mildly engorged. The bilateral chest tubes and the mediastinal drain are in stable position. There is no pneumothorax or large pleural effusion. The Swan-Ganz catheter tip overlies a proximal right lower lobe pulmonary artery branch. The mediastinum is widened and accentuated by the hypo inflation. IMPRESSION: Bilateral hypoinflation following extubation of the trachea there is crowding of the pulmonary vascularity. Left basilar atelectasis or less likely pneumonia is more prominent today. Probable small left pleural effusion. The remaining support tubes and lines are in stable position. Electronically Signed   By: David  Martinique M.D.   On: 10/29/2015 07:40   Dg Chest Port 1 View  10/28/2015  CLINICAL DATA:  Post CABG, on ventilator, followup portable chest x-ray of 10/28/2015 EXAM: PORTABLE CHEST 1 VIEW COMPARISON:  None. FINDINGS: The tip of the endotracheal tube is approximately 5.2 cm above the carina. Aeration has improved slightly. Atelectasis remains at the lung bases left-greater-than-right. Swan-Ganz catheter tip is in the right lower lobe pulmonary artery and bilateral chest tubes remain. No pneumothorax is seen. Heart size is stable. IMPRESSION: 1. Improved aeration. 2. Bilateral chest tubes.  No pneumothorax. 3. Tip of  endotracheal tube approximately 5.2 cm above the carina. Electronically Signed   By: Ivar Drape M.D.   On: 10/28/2015 08:01   Dg Chest Port 1 View  10/27/2015  CLINICAL DATA:  Status post CABG EXAM: PORTABLE CHEST 1 VIEW COMPARISON:  10/27/2015 FINDINGS: Unchanged tracheostomy tube. Swan-Ganz central venous line is now seen with tip advanced into the right perihilar region. Stable right chest tube with and left chest tube. NG tube again crosses the gastroesophageal junction. Stable mediastinal drain. Enlarged cardiac silhouette. Limited inspiratory effect with bibasilar opacities likely representing atelectasis. IMPRESSION: Anticipated postoperative appearance with bibasilar atelectasis. Electronically Signed   By: Skipper Cliche M.D.   On: 10/27/2015 19:25   Dg Chest Portable 1 View  10/27/2015  CLINICAL DATA:  Status post coronary bypass grafting EXAM: PORTABLE CHEST 1 VIEW COMPARISON:  10/23/2015 FINDINGS: Cardiac shadow is mildly enlarged. A Swan-Ganz catheter is noted in the right pulmonary outflow tract. Bilateral thoracostomy catheters are seen. No pneumothorax is noted. An endotracheal tube is noted in satisfactory position. Intra-aortic balloon pump is noted over the aortic arch. Mild left basilar atelectasis is seen. Mild central vascular congestion is noted. IMPRESSION: Postoperative changes with tubes and lines as described above. Mild vascular congestion and left basilar atelectasis. Electronically Signed   By: Inez Catalina M.D.   On: 10/27/2015 17:23   Portable Chest X-ray 1 View  10/21/2015  CLINICAL DATA:  55 year old male with a history of acute systolic congestive heart failure EXAM: PORTABLE CHEST 1 VIEW COMPARISON:  10/20/2015 FINDINGS: Heart size unchanged, enlarged. Similar appearance of low lung volumes with interstitial opacities, interlobular septal thickening, and developing airspace disease of the bilateral lower lungs. No pneumothorax. Linear opacity overlying the mediastinum  and the heart border, of on certain significance. Favored to overlie the patient. IMPRESSION: Evidence of worsening congestive heart failure, with increasing edema. New linear opacity overlies the mediastinum, favored to be overlying the patient, though cannot be localized on this view. Signed, Dulcy Fanny. Earleen Newport,  DO [facility] Electronically Signed   By: Corrie Mckusick D.O.   On: 10/21/2015 07:16   Dg Chest Portable 1 View  10/20/2015  CLINICAL DATA:  Shortness of breath. EXAM: PORTABLE CHEST 1 VIEW COMPARISON:  None. FINDINGS: Midline trachea. Cardiomegaly accentuated by AP portable technique. No pleural effusion or pneumothorax. Mildly low lung volumes with bibasilar atelectasis. IMPRESSION: Mild cardiomegaly and low lung volumes.  No acute findings. Electronically Signed   By: Abigail Miyamoto M.D.   On: 10/20/2015 18:09   Dg Abd Portable 1v  11/02/2015  CLINICAL DATA:  Assess feeding tube positioning EXAM: PORTABLE ABDOMEN - 1 VIEW COMPARISON:  None in PACs FINDINGS: The repeated KUB with has less motion artifact. The tip of the feeding tube and nasogastric tube lie in the region of the distal gastric body in the pre-pyloric region. The bowel gas pattern is within the limits of normal. Numerous tubes and lines and electrodes overlie the abdomen. IMPRESSION: The tips of the feeding tube and the esophagogastric tube lie in the region of the distal gastric body in the pre-pyloric region. Electronically Signed   By: David  Martinique M.D.   On: 11/02/2015 12:16   Dg Addison Bailey G Tube Plc W/fl-no Rad  11/03/2015  CLINICAL DATA:  NASO G TUBE PLACEMENT WITH FLUORO Fluoroscopy was utilized by the requesting physician.  No radiographic interpretation.    PHYSICAL EXAM General: Sitting up in bed  Neck: JVP hard to see.  no thyromegaly or thyroid nodule.  S/p Trach  Lungs: mild rhonchi   CV: Nondisplaced PMI.  Heart regular S1/S2, no S3/S4, no murmur. Trace ankle  edema. Abdomen: Soft, nontender, no hepatosplenomegaly, no distention.  Neurologic: alert and oriented x 3 Extremities: No clubbing or cyanosis.  Dopplerable pedal pulses. Maculopapular rash on his sides  TELEMETRY: A flutter 70s   ASSESSMENT AND PLAN:  1. CAD: S/p late presentation inferior MI and CABG x 4. Continue statin, ASA. Coronary angiography 3/27 with multiple VT episodes showed patent LIMA-LAD and SVG-RCA.  Patent SVG-ramus and SVG-D but slow flow in both grafts with diffusely diseased small target vessels.  2. Cardiogenic shock: Acute systolic CHF with RV failure post inferior MI with suspected RV involvement. EF 25-30% on echo (difficult images on 3/24 echo). He has significant RV failure from suspected RV infarct.  3/27 IABP placed to try to allow decrease in milrinone with frequent VT.  3/31 echo with EF 30-35%.  IABP out 4/2.  Milrinone off 4/7 - Off milrinone. Co-ox 68%   - CVP remains up 17-18. CXR wet. Got lasix 40 mg IV. Still on free H2O for hypernatremia - No spiro/dig with CKD.  - He is now off NO and on sildenafil 40 tid.   -Continue hydralazine to 50 mg tid for afterload reduction (no Imdur with sildenafil use).   3. Complete heart block: Pacemaker turned off 4/3.  Underlying NSR in 70s. Now in AFL 4. VF arrest 3/24 early am and again at night 3/24.  Only occasional PVCs today.  - Continue amiodarone.  5. AKI: Creatinine lower today.  Suspect ATN. Baseline creatinine 1.0. Renal following 6. ID: Possible LLL PNA.  He is on vancomycin/levofloxacin.  7. Anemia: No overt bleeding.  Got 2 units PRBCs on 4/4. Hemoglobin 11>10.9.  8. Pulmonary: Pulmonary edema, LLL PNA.  Failed SBT.  S/P Trach 4/5   9. Hypernatremia: Increase free water boluses. Sodium 152 10. AFL - On heparin. If doesn't convert will do TEE/DCCV  this week.   Glori Bickers MD  11/14/2015 12:17 PM

## 2015-11-14 NOTE — Progress Notes (Signed)
RT note-patient has been on ATC, tolerated well. New orders to rest every 4 hours. Placed to PS/+8 peep+5, for at this time, tolerating well, continue to monitor.

## 2015-11-15 ENCOUNTER — Inpatient Hospital Stay (HOSPITAL_COMMUNITY): Payer: BLUE CROSS/BLUE SHIELD

## 2015-11-15 DIAGNOSIS — K913 Postprocedural intestinal obstruction: Secondary | ICD-10-CM

## 2015-11-15 DIAGNOSIS — I4892 Unspecified atrial flutter: Secondary | ICD-10-CM

## 2015-11-15 LAB — CBC
HCT: 36.1 % — ABNORMAL LOW (ref 39.0–52.0)
Hemoglobin: 10.9 g/dL — ABNORMAL LOW (ref 13.0–17.0)
MCH: 28.8 pg (ref 26.0–34.0)
MCHC: 30.2 g/dL (ref 30.0–36.0)
MCV: 95.3 fL (ref 78.0–100.0)
Platelets: 299 10*3/uL (ref 150–400)
RBC: 3.79 MIL/uL — ABNORMAL LOW (ref 4.22–5.81)
RDW: 17.9 % — ABNORMAL HIGH (ref 11.5–15.5)
WBC: 13.2 10*3/uL — ABNORMAL HIGH (ref 4.0–10.5)

## 2015-11-15 LAB — GLUCOSE, CAPILLARY
GLUCOSE-CAPILLARY: 87 mg/dL (ref 65–99)
GLUCOSE-CAPILLARY: 105 mg/dL — AB (ref 65–99)
GLUCOSE-CAPILLARY: 87 mg/dL (ref 65–99)
Glucose-Capillary: 105 mg/dL — ABNORMAL HIGH (ref 65–99)
Glucose-Capillary: 125 mg/dL — ABNORMAL HIGH (ref 65–99)
Glucose-Capillary: 79 mg/dL (ref 65–99)

## 2015-11-15 LAB — BASIC METABOLIC PANEL
Anion gap: 13 (ref 5–15)
BUN: 106 mg/dL — ABNORMAL HIGH (ref 6–20)
CO2: 30 mmol/L (ref 22–32)
Calcium: 9 mg/dL (ref 8.9–10.3)
Chloride: 110 mmol/L (ref 101–111)
Creatinine, Ser: 2.14 mg/dL — ABNORMAL HIGH (ref 0.61–1.24)
GFR calc Af Amer: 39 mL/min — ABNORMAL LOW (ref >60)
GFR calc non Af Amer: 33 mL/min — ABNORMAL LOW (ref >60)
Glucose, Bld: 123 mg/dL — ABNORMAL HIGH (ref 65–99)
Potassium: 4.1 mmol/L (ref 3.5–5.1)
Sodium: 153 mmol/L — ABNORMAL HIGH (ref 135–145)

## 2015-11-15 LAB — MAGNESIUM: Magnesium: 2.5 mg/dL — ABNORMAL HIGH (ref 1.7–2.4)

## 2015-11-15 LAB — CARBOXYHEMOGLOBIN
Carboxyhemoglobin: 1.6 % — ABNORMAL HIGH (ref 0.5–1.5)
Methemoglobin: 0.8 % (ref 0.0–1.5)
O2 Saturation: 62.3 %
Total hemoglobin: 11.2 g/dL — ABNORMAL LOW (ref 13.5–18.0)

## 2015-11-15 LAB — HEPARIN LEVEL (UNFRACTIONATED): Heparin Unfractionated: 0.15 IU/mL — ABNORMAL LOW (ref 0.30–0.70)

## 2015-11-15 MED ORDER — FREE WATER
400.0000 mL | Status: DC
  Administered 2015-11-15 – 2015-11-17 (×10): 400 mL

## 2015-11-15 MED ORDER — METRONIDAZOLE 500 MG PO TABS
250.0000 mg | ORAL_TABLET | Freq: Three times a day (TID) | ORAL | Status: DC
  Administered 2015-11-15 – 2015-11-17 (×7): 250 mg via ORAL
  Filled 2015-11-15 (×7): qty 1

## 2015-11-15 MED ORDER — SENNOSIDES 8.8 MG/5ML PO SYRP
5.0000 mL | ORAL_SOLUTION | Freq: Two times a day (BID) | ORAL | Status: AC
  Administered 2015-11-15 – 2015-11-16 (×3): 5 mL via ORAL
  Filled 2015-11-15 (×3): qty 5

## 2015-11-15 MED ORDER — TORSEMIDE 20 MG PO TABS: 40.0000 mg | ORAL_TABLET | Freq: Every day | ORAL | Status: DC

## 2015-11-15 MED ORDER — METOCLOPRAMIDE HCL 5 MG/ML IJ SOLN
10.0000 mg | Freq: Once | INTRAMUSCULAR | Status: AC
  Administered 2015-11-15: 10 mg via INTRAVENOUS
  Filled 2015-11-15: qty 2

## 2015-11-15 MED ORDER — POTASSIUM CHLORIDE 10 MEQ/50ML IV SOLN
10.0000 meq | INTRAVENOUS | Status: AC
Start: 2015-11-15 — End: 2015-11-15
  Administered 2015-11-15 (×2): 10 meq via INTRAVENOUS
  Filled 2015-11-15 (×2): qty 50

## 2015-11-15 MED ORDER — AMIODARONE LOAD VIA INFUSION
150.0000 mg | Freq: Once | INTRAVENOUS | Status: AC
  Administered 2015-11-15: 150 mg via INTRAVENOUS
  Filled 2015-11-15: qty 83.34

## 2015-11-15 MED ORDER — FENTANYL CITRATE (PF) 100 MCG/2ML IJ SOLN
25.0000 ug | INTRAMUSCULAR | Status: DC | PRN
  Administered 2015-11-16 – 2015-11-24 (×3): 50 ug via INTRAVENOUS
  Filled 2015-11-15 (×3): qty 2

## 2015-11-15 MED ORDER — HYDRALAZINE HCL 25 MG PO TABS
25.0000 mg | ORAL_TABLET | Freq: Three times a day (TID) | ORAL | Status: DC
  Administered 2015-11-15 – 2015-11-24 (×26): 25 mg via ORAL
  Filled 2015-11-15 (×26): qty 1

## 2015-11-15 MED ORDER — AMIODARONE IV BOLUS ONLY 150 MG/100ML: 150.0000 mg | Freq: Once | INTRAVENOUS | Status: DC

## 2015-11-15 MED ORDER — LEVALBUTEROL HCL 1.25 MG/0.5ML IN NEBU
1.2500 mg | INHALATION_SOLUTION | Freq: Three times a day (TID) | RESPIRATORY_TRACT | Status: DC
Start: 2015-11-15 — End: 2015-11-24
  Administered 2015-11-15 – 2015-11-24 (×28): 1.25 mg via RESPIRATORY_TRACT
  Filled 2015-11-15 (×28): qty 0.5

## 2015-11-15 MED ORDER — LEVALBUTEROL HCL 1.25 MG/0.5ML IN NEBU
1.2500 mg | INHALATION_SOLUTION | Freq: Four times a day (QID) | RESPIRATORY_TRACT | Status: DC | PRN
  Administered 2015-11-23: 1.25 mg via RESPIRATORY_TRACT
  Filled 2015-11-15: qty 0.5

## 2015-11-15 NOTE — Evaluation (Signed)
Physical Therapy Evaluation Patient Details Name: Christopher [name] MRN: [id number] DOB: 08/27/1960 Today's Date: 11/15/2015   History of Present Illness  [name] w/ ischemic cardiomyopathy following MI. Patient underwent CABG & CEA. Post-operative course complicated by arrhythmia and biventricular failure, HCAP, heart block, trach placement.  Clinical Impression  Patient is s/p above procedures, presenting with functional limitations due to the deficits listed below (see PT Problem List). Very motivated and eager to get OOB today with therapy. Required min assist +2 all mobility and was able to take small steps with assist for balance to chair today. Vitals stable throughout therapy session, with mild dizziness reported. Tolerated therapeutic exercises very well. Reviewed sternal precautions and safety with mobility. Patient will greatly benefit from skilled PT to increase their independence and safety with mobility to allow discharge to the venue listed below.       Follow Up Recommendations CIR    Equipment Recommendations   (TBD next venue of care)    Recommendations for Other Services Rehab consult;OT consult     Precautions / Restrictions Precautions Precautions: Sternal;Fall Precaution Comments: Reviewed sternal precautions with patient Restrictions Weight Bearing Restrictions: Yes Other Position/Activity Restrictions: Sternal preacutions      Mobility  Bed Mobility Overal bed mobility: Needs Assistance Bed Mobility: Rolling;Sidelying to Sit Rolling: Min assist Sidelying to sit: Min assist;HOB elevated;+2 for safety/equipment       General bed mobility comments: Min assist to guide roll onto Rt side for Log roll technique. Cues for sequencing, min assist for truncal support to rise to EOB, with +2 assist for  equipment and safety.  Transfers Overall transfer level: Needs assistance Equipment used: None Transfers: Sit to/from Stand Sit to Stand: Min assist;+2  safety/equipment         General transfer comment: Min assist for boost to stand from high hospital bed (lowest posititon). Pt with posterior lean requiring assist to balance. Cues for technique and to hold heart pillow while rising to maintain precautions.  Ambulation/Gait Ambulation/Gait assistance: Min assist;+2 safety/equipment Ambulation Distance (Feet): 5 Feet Assistive device: 1 person hand held assist Gait Pattern/deviations: Step-through pattern;Decreased stride length;Shuffle;Leaning posteriorly;Wide base of support Gait velocity: decreased Gait velocity interpretation: Below normal speed for age/gender General Gait Details: Min assist to correct posterior lean. Able to take small shuffled steps towards chair today, including turn with hand held support. VSS throughout. Performed pre-gait activites with weight-shifting Lt/Rt, small marches, and focusing on anterior weight shift for balance. Cues for sequencing and safety throughout.  Stairs            Wheelchair Mobility    Modified Rankin (Stroke Patients Only)       Balance Overall balance assessment: Needs assistance Sitting-balance support: No upper extremity supported;Feet supported Sitting balance-Leahy Scale: Fair     Standing balance support: Single extremity supported Standing balance-Leahy Scale: Poor Standing balance comment: Tolerated standing approx 6 minutes focusing on balance and endurance                             Pertinent Vitals/Pain Pain Assessment: Faces Faces Pain Scale: Hurts even more Pain Location: Chest, LLE Pain Descriptors / Indicators: Sore;Tightness Pain Intervention(s): Monitored during session;Repositioned;Limited activity within patient's tolerance    Home Living Family/patient expects to be discharged to:: Inpatient rehab Living Arrangements: Spouse/significant other Available Help at Discharge: Family (wife works)             Additional Comments:  Limited  information due to trach/vent    Prior Function Level of Independence: Independent         Comments: 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 HR in 70s during treatment, BP 118/82, RR 14. Reported mild dizziness with supine<>sit and sit<>stand.    Exercises General Exercises - Lower Extremity Ankle Circles/Pumps: AROM;Both;20 reps;Supine Quad Sets: Strengthening;Both;20 reps;Supine Long Arc Quad: Strengthening;Both;15 reps;Seated Straight Leg Raises: Strengthening;Both;10 reps;Supine Hip Flexion/Marching: Strengthening;Both;20 reps;Standing      Assessment/Plan    PT Assessment Patient needs continued PT services  PT Diagnosis Difficulty walking;Abnormality of gait;Generalized weakness;Acute pain   PT Problem List Decreased strength;Decreased range of motion;Decreased activity tolerance;Decreased balance;Decreased mobility;Decreased knowledge of use of DME;Decreased knowledge of precautions;Cardiopulmonary status limiting activity;Obesity;Pain  PT Treatment Interventions DME instruction;Gait training;Functional mobility training;Therapeutic activities;Therapeutic exercise;Balance training;Patient/family education   PT Goals (Current goals can be found in the Care Plan section) Acute Rehab PT Goals Patient Stated Goal: Unable to verbalize at this time. PT Goal Formulation: With patient Time For Goal Achievement: 11/29/15 Potential to Achieve Goals:  Good    Frequency Min 3X/week   Barriers to discharge Decreased caregiver support wife works. Unable to state at this time if he will have more assistance after returning home    Co-evaluation               End of Session Equipment Utilized During Treatment: Gait belt;Oxygen Activity Tolerance: Patient tolerated treatment well Patient left: in chair;with call bell/phone within reach Nurse Communication: Mobility status;Precautions         Time: 3557-3220 PT Time Calculation (min) (ACUTE ONLY): 33 min   Charges:   PT Evaluation $PT Eval High Complexity: 1 Procedure PT Treatments $Therapeutic Activity: 8-22 mins   PT G Codes:        Berton Mount 11/15/2015, 3:41 PM Sunday Spillers [place],  [phone]

## 2015-11-15 NOTE — Progress Notes (Signed)
RT note- Back to bed, placed to rest mode on ventilator.

## 2015-11-15 NOTE — Progress Notes (Signed)
13 Days Post-Op Procedure(s) (LRB): Right/Left Heart Cath and Coronary/Graft Angiography (N/A) Intra-Aortic Balloon Pump Insertion Subjective: Out of bed to chair today with assistance on pressure support weaning via trach bleeding around trach site-IV heparin for atrial fibrillation on hold at 1000 units per hour Recurrent atrial fibrillation this a.m.-we'll rebolus with IV amiodarone load Abdominal distention with colonic ileus on x-ray, mild increase in white count, will check C. difficile toxin and start empiric metronidazole via tube Patient may need DC cardioversion for his recurrent atrial fibrillation and poor RV function. Chest x-ray shows atelectasis-effusion left base, continue IV Lasix 40 mg daily   Objective: Vital signs in last 24 hours: Temp:  [98.2 F (36.8 C)-99 F (37.2 C)] 98.5 F (36.9 C) (04/09 0717) Pulse Rate:  [47-84] 69 (04/09 0700) Cardiac Rhythm:  [-] Atrial flutter (04/08 2300) Resp:  [12-23] 12 (04/09 0700) BP: (99-129)/(56-94) 125/90 mmHg (04/09 0700) SpO2:  [94 %-100 %] 100 % (04/09 0820) FiO2 (%):  [40 %-60 %] 40 % (04/09 0820) Weight:  [233 lb 7.5 oz (105.9 kg)] 233 lb 7.5 oz (105.9 kg) (04/09 0455)  Hemodynamic parameters for last 24 hours: CVP:  [14 mmHg-21 mmHg] 15 mmHg  Intake/Output from previous day: 04/08 0701 - 04/09 0700 In: 3774.1 [I.V.:914.1; NG/GT:2760; IV Piggyback:100] Out: 2660 [Urine:2610; Stool:50] Intake/Output this shift:    Exam Alert and oriented up in chair Diminished breath sounds left base Sternal incision well-healed Atrial fibrillation rate controlled Abdominal distention with some high-pitched bowel sounds, nontender  Lab Results:  Recent Labs  11/13/15 0430  11/14/15 1622 11/15/15 0420  WBC 10.4  --   --  13.2*  HGB 10.9*  < > 13.3 10.9*  HCT 36.6*  < > 39.0 36.1*  PLT 337  --   --  299  < > = values in this interval not displayed. BMET:  Recent Labs  11/14/15 0415 11/14/15 1622 11/15/15 0420  NA  152* 152* 153*  K 3.8 4.6 4.1  CL 107 109 110  CO2 31  --  30  GLUCOSE 124* 157* 123*  BUN 102* 104* 106*  CREATININE 1.99* 2.20* 2.14*  CALCIUM 8.9  --  9.0    PT/INR: No results for input(s): LABPROT, INR in the last 72 hours. ABG    Component Value Date/Time   PHART 7.419 11/13/2015 0526   HCO3 35.5* 11/13/2015 0526   TCO2 28 11/14/2015 1622   ACIDBASEDEF TEST WILL BE CREDITED 11/01/2015 0446   O2SAT 62.3 11/15/2015 0438   CBG (last 3)   Recent Labs  11/14/15 1908 11/15/15 0016 11/15/15 0450  GLUCAP 108* 125* 79    Assessment/Plan: S/P Procedure(s) (LRB): Right/Left Heart Cath and Coronary/Graft Angiography (N/A) Intra-Aortic Balloon Pump Insertion Atrial fibrillation-rebolus with amiodarone and possible  DC C tomorrow Abdominal distention-colonic ileus-rule out C. Difficile Continue ventilator wean on tracheostomy   LOS: 26 days    Kathlee Nations Trigt III 11/15/2015

## 2015-11-15 NOTE — Progress Notes (Signed)
CT surgery p.m. Rounds  Had stable day out of bed to chair again Remains on pressure support weaning Tube feeds resumed after episode of nausea Persistent bleeding around tracheostomy site-hold heparin for 2 hours and resume at 1000 units per hour

## 2015-11-15 NOTE — Progress Notes (Signed)
PULMONARY / CRITICAL CARE MEDICINE   Name: Christopher [name] MRN: [id number] DOB: 1960-12-14    ADMISSION DATE:  10/20/2015 CONSULTATION DATE:  11/03/2015  REFERRING MD: Dr. Donata Clay / CVTS  CHIEF COMPLAINT:  Acute Hypoxic Respiratory Failure  HISTORY OF PRESENT ILLNESS:   [name] w/ ischemic cardiomyopathy following MI. Patient underwent CABG & CEA. Post-operative course complicated by arrhythmia and biventricular failure, HCAP, heart block. Improving  SUBJECTIVE: Patient did have N/V yesterday. Tube feeds were held. Was having some moderate secretions with turning/bathing noted by RT. Tolerated periods of T-collar yesterday. More abdominal distension & abdominal discomforttoday.  REVIEW OF SYSTEMS:  Denies any nausea. No chest pain or pressure.  VITAL SIGNS: BP 125/90 mmHg  Pulse 69  Temp(Src) 98.5 F (36.9 C) (Oral)  Resp 12  Ht 5' 10.5" (1.791 m)  Wt 233 lb 7.5 oz (105.9 kg)  BMI 33.01 kg/m2  SpO2 100%  HEMODYNAMICS: CVP:  [14 mmHg-21 mmHg] 14 mmHg  VENTILATOR SETTINGS: Vent Mode:  [-] PRVC FiO2 (%):  [40 %-60 %] 40 % Set Rate:  [12 bmp] 12 bmp Vt Set:  [590 mL] 590 mL PEEP:  [5 cmH20-8 cmH20] 5 cmH20 Pressure Support:  [8 cmH20] 8 cmH20 Plateau Pressure:  [16 cmH20-20 cmH20] 16 cmH20  INTAKE / OUTPUT: I/O last 3 completed shifts: In: 6015.3 [I.V.:1300.3; Other:45; WU/JW:1191; IV Piggyback:250] Out: 4782 [NFAOZ:3086; Stool:250]  PHYSICAL EXAMINATION: General:  Comfortable. Awake. Sitting up in bed. Neuro:  Follows commands. Moving all 4 extremities. Grossly nonfocal. HEENT:  No scleral icterus or injection. Trach in place.  Cardiovascular:  Regular rate. No appreciable JVD. Bilateral lower extremity edema. Lungs:  Coarse breath sounds on auscultation bilaterally. Normal work of breathing on Vent. Abdomen:  Soft. Protuberant. Normal BS. Nontender. Integument:  Warm & dry. No rash on exposed skin.  LABS:  BMET  Recent Labs Lab 11/13/15 0430   11/14/15 0415 11/14/15 1622 11/15/15 0420  NA 152*  < > 152* 152* 153*  K 3.5  < > 3.8 4.6 4.1  CL 105  < > 107 109 110  CO2 34*  --  31  --  30  BUN 115*  < > 102* 104* 106*  CREATININE 2.22*  < > 1.99* 2.20* 2.14*  GLUCOSE 129*  < > 124* 157* 123*  < > = values in this interval not displayed. Electrolytes  Recent Labs Lab 11/11/15 0320 11/12/15 0400 11/13/15 0430 11/14/15 0415 11/15/15 0420  CALCIUM 9.6 9.5 9.3 8.9 9.0  MG 2.8* 2.5* 2.6* 2.4 2.5*  PHOS 5.9*  5.7* 4.3 5.8*  --   --    CBC  Recent Labs Lab 11/12/15 0400  11/13/15 0430 11/13/15 1924 11/14/15 1622 11/15/15 0420  WBC 12.1*  --  10.4  --   --  13.2*  HGB 11.0*  < > 10.9* 15.6 13.3 10.9*  HCT 36.2*  < > 36.6* 46.0 39.0 36.1*  PLT 348  --  337  --   --  299  < > = values in this interval not displayed. Coag's  Recent Labs Lab 11/11/15 0940  APTT 29  INR 1.44    Sepsis Markers No results for input(s): LATICACIDVEN, PROCALCITON, O2SATVEN in the last 168 hours. ABG  Recent Labs Lab 11/12/15 0451 11/12/15 0457 11/13/15 0526  PHART 7.615* 7.611* 7.419  PCO2ART 37.9 37.9 55.2*  PO2ART 74.0* 74.0* 68.0*    Liver Enzymes  Recent Labs Lab 11/09/15 0345 11/10/15 0330 11/10/15 1040 11/11/15 0320 11/12/15 0400  11/13/15 0430  AST 49* 47* 50*  --   --   --   ALT 52 52 56  --   --   --   ALKPHOS 89 75 79  --   --   --   BILITOT 1.1 0.6 1.0  --   --   --   ALBUMIN 2.7* 2.5* 2.8* 2.8* 2.5* 2.6*    Cardiac Enzymes No results for input(s): TROPONINI, PROBNP in the last 168 hours.  Glucose  Recent Labs Lab 11/14/15 0733 11/14/15 1127 11/14/15 1531 11/14/15 1908 11/15/15 0016 11/15/15 0450  GLUCAP 141* 131* 131* 108* 125* 79   Imaging Dg Chest Port 1 View  11/15/2015  CLINICAL DATA:  Chronic ventilator dependent respiratory failure. CABG and right carotid endarterectomy 10/27/2015. EXAM: PORTABLE CHEST 1 VIEW COMPARISON:  11/14/2015 and earlier. FINDINGS: Tracheostomy tube tip in  satisfactory position below the thoracic inlet. Left subclavian central venous catheter tip projects over the mid SVC, unchanged. Right arm PICC tip projects over lower SVC at or near the cavoatrial junction, unchanged. Feeding tube courses below the diaphragm into the stomach. Sternotomy for CABG. Cardiac silhouette moderately enlarged. Mild pulmonary venous hypertension without overt edema, unchanged. Dense consolidation in the left lower lobe with air bronchograms, unchanged. Moderate-sized left pleural effusion, unchanged. No new pulmonary parenchymal abnormalities. IMPRESSION: 1.  Support apparatus satisfactory. 2. Stable dense left lower lobe atelectasis and/or pneumonia and left pleural effusion. 3. Stable cardiomegaly and pulmonary venous hypertension without overt edema. 4. No new abnormalities. Electronically Signed   By: Hulan Saas M.D.   On: 11/15/2015 07:22   Dg Abd Portable 1v  11/15/2015  CLINICAL DATA:  Chronic ventilator dependent respiratory failure. Acute onset of nausea and vomiting. EXAM: PORTABLE ABDOMEN - 1 VIEW COMPARISON:  11/03/2015, 11/02/2015. FINDINGS: Feeding tube tip projects at the expected location of the 4th portion of the duodenum at or near the ligament Treitz. Nonobstructive bowel gas pattern. Gas in normal caliber loops of small bowel throughout the abdomen and pelvis. Gas throughout normal caliber colon from cecum to rectum. No suggestion of free intraperitoneal air. IMPRESSION: 1. Bowel gas pattern indicating a mild generalized ileus. No evidence of bowel obstruction. 2. Feeding tube tip appropriately positioned in the 4th portion of the duodenum at or near the ligament of Treitz. Electronically Signed   By: Hulan Saas M.D.   On: 11/15/2015 07:26    STUDIES:  Port CXR 3/28:  Previously reviewed by me. R PICC in good psition. ETT >5cm above carina. New L [place] CVL. L lung opacity unchanged. Low lung volumes. L chest tube in place. Port CXR 4/8:  Previously  reviewed by me. Left basilar opacity unchanged. Trach & lines in place & unchanged.  Port CXR 4/9:  Personally reviewed by me. Left basilar opacity persists. No changes in tube or line positions.  KUB 4/9:  Personally reviewed by me. Mildly distended bowel filled with gas. No evidence of air-fluid levels or obstruction.  MICROBIOLOGY: Tracheal Asp Ctx 3/27:  Oral Flora Urine Ctx 3/24:  Negative MRSA PCR 3/20:  Negative MRSA PCR 3/14:  Negative  ANTIBIOTICS: Zosyn 3/29 - 4/1 Vancomcyin 3/21 - 3/22; 3/29 - 4/7 Cefuroxime 3/21 - 3/23 (periop prophylaxis) Elita Quick 3/27 - 3/29 Levaquin 4/1 - 4/7  SIGNIFICANT EVENTS: 3/14 - Admit 3/14 - LHC 3/21 - CABG 3/27 - IABP replaced >> 4/1 3/28 - Chest Tubes removed. RHC & LHC.  LINES/TUBES: Trach 6.0 (JY) 4/5 >>  L  CVL 3/28>>> R PICC  3/23>>> R NJT>>> Foley>>> IABP 3/27 - 4/1 OETT 7.5 Extubated 3/22; 3.24 - 4/5  ASSESSMENT / PLAN:  PULMONARY A: Acute Hypoxic Respiratory Failure - Improving. Chronic Hypercarbic Respiratory Failure - OSA/OHS, COPD. Chronic hypoxemic resp failure - cardiogenic pulmonary edema  P:   Continuing Trach Collar Trials for 4 hours Rest on Full Vent Support for 4 hours Diuresis per Cardiology Continue Xopenex q6hr while awake  CARDIOVASCULAR A:  S/P CABG & CEA Left Ventricular Heart Failure post MI Cardiogenic Shock Periop Complete Heart Block & Post-op V Tach H/O HTN  P:  CVTS & Cardiology Managing ASA VT Lipitor VT Amiodarone gtt Heparin gtt Revatio VT tid Lasix IV daily  RENAL A:   Acute Renal Failure - Resolving. Hypoklalemia - Resolved. Hypernatremia - Stable. Secondary to aggressive diuresis.  P:   Lasix IV daily Trending UOP with Foley Electrolytes & Renal Function daily Replacing electrolytes as indicated Increasing Free Water to VT q4hr  GASTROINTESTINAL A:   Transaminitis - Likely shock liver/hepatic congestion. Ileus  P:   Holding TFs & Free Water for  now Senna bid VT Protonix VT daily Dulcolax PR prn  HEMATOLOGIC A:   Anemia - Hgb stable. No signs of active bleeding. Leukocytosis - Mild.  P:  Transfusion PRBC as needed Heparin gtt Trending cell counts daily w/ CBC  INFECTIOUS A:   Possible HCAP - Ctx w/ Oral Flora.  P:   Completed 10 day course of antibiotics Plan to re-culture for fever  ENDOCRINE CBG (last 3)  A:   Hyperglycemia - BG controlled.  P:   Continue Levemir bid Accu-checks q4hr SSI per algorithm  NEUROLOGIC A:   Sedation on Ventilator Post-op Pain Control   P:   RASS goal: 0 Versed IV prn Fentanyl IV prn Seroquel qhs  FAMILY UPDATES: Girlfriend updated at bedside 4/8 by Dr. Jamison Neighbor.  TODAY'S SUMMARY:  55 year old male s/p CABG & Right CEA with complicated post-op course. Patient's renal function slowly improving. Tolerating tracheostomy collar trials. Ileus today. Starting Senna bid & temporarily holding TF. Plan to restart TF after a couple of hours at 1/2 dose rate.  I have spent a total of 31 minutes of critical care time today caring for the patient & reviewing the patient's electronic medical record.  Donna Christen Jamison Neighbor, M.D. [facility] Pager:  [phone] After 3pm or if no response, call [phone]  11/15/2015, 7:32 AM

## 2015-11-15 NOTE — Plan of Care (Signed)
Problem: Activity: Goal: Risk for activity intolerance will decrease Outcome: Progressing Pt did well getting out of bed today  Problem: Bowel/Gastric: Goal: Gastrointestinal status for postoperative course will improve Outcome: Not Progressing 4/8 into am 4/9:Abdomen distended, c/o of fullness/discomfort, abdominal xray/Reglan order  Pm 4/9: abd softer, pt more comfortable, pt having BMs again  Problem: Coping: Goal: Ability to adjust to condition or change in health will improve Outcome: Progressing Pt remains in good spirits  Problem: Respiratory: Goal: Levels of oxygenation will improve Outcome: Progressing Pt is tolerating weaning plan

## 2015-11-15 NOTE — Progress Notes (Signed)
Rehab Admissions Coordinator Note:  Patient was screened by Trish Mage for appropriateness for an Inpatient Acute Rehab Consult.  At this time, we are recommending Inpatient Rehab consult.  Trish Mage 11/15/2015, 4:20 PM  I can be reached at [phone].

## 2015-11-15 NOTE — Progress Notes (Addendum)
Patient ID: Christopher [name], male   DOB: [birth year], 55 y.o.   MRN: [id number]   ADVANCED HF ROUNDING NOTE  Subjective:  Sitting up in chair today on trach vent. Had several BMs overnight. Still oozing from trach.    Remains in AFL on IV amio.   CVP still 18.   Serum sodium still going up. Now 158. On Free H2O 400 q4. C/o being very thirsty. Wants something to drink    Scheduled Meds: . amiodarone  150 mg Intravenous Once  . antiseptic oral rinse  7 mL Mouth Rinse QID  . aspirin  81 mg Oral Daily  . atorvastatin  80 mg Oral q1800  . chlorhexidine gluconate (SAGE KIT)  15 mL Mouth Rinse BID  . feeding supplement (PRO-STAT SUGAR FREE 64)  30 mL Per Tube TID  . free water  400 mL Per Tube Q4H  . furosemide  40 mg Intravenous Daily  . hydrALAZINE  25 mg Oral 3 times per day  . insulin aspart  0-24 Units Subcutaneous 6 times per day  . insulin detemir  12 Units Subcutaneous BID  . levalbuterol  1.25 mg Nebulization TID  . metroNIDAZOLE  250 mg Oral 3 times per day  . pantoprazole sodium  40 mg Per Tube Daily  . potassium chloride  40 mEq Oral 3 times per day  . QUEtiapine  50 mg Oral QHS  . sennosides  5 mL Oral BID  . sildenafil  40 mg Oral TID  . silver sulfADIAZINE   Topical BID  . sodium chloride flush  10-40 mL Intracatheter Q12H  . triamcinolone cream   Topical BID   Continuous Infusions: . sodium chloride Stopped (11/15/15 0600)  . amiodarone 30 mg/hr (11/15/15 1102)  . feeding supplement (VITAL HIGH PROTEIN) Stopped (11/15/15 0600)  . heparin 1,000 Units/hr (11/14/15 1150)   PRN Meds:.diphenhydrAMINE, fentaNYL (SUBLIMAZE) injection, levalbuterol, midazolam, ondansetron (ZOFRAN) IV, sodium chloride flush, traMADol    Filed Vitals:   11/15/15 1000 11/15/15 1100 11/15/15 1122 11/15/15 1147  BP: 123/75 109/80    Pulse: 74 75    Temp:   98.4 F (36.9 C)   TempSrc:   Oral   Resp: 12 11    Height:      Weight:      SpO2: 97% 98%  98%    Intake/Output Summary  (Last 24 hours) at 11/15/15 1306 Last data filed at 11/15/15 1200  Gross per 24 hour  Intake 3141.1 ml  Output   2715 ml  Net  426.1 ml    LABS: Basic Metabolic Panel:  Recent Labs  11/13/15 0430  11/14/15 0415 11/14/15 1622 11/15/15 0420  NA 152*  < > 152* 152* 153*  K 3.5  < > 3.8 4.6 4.1  CL 105  < > 107 109 110  CO2 34*  --  31  --  30  GLUCOSE 129*  < > 124* 157* 123*  BUN 115*  < > 102* 104* 106*  CREATININE 2.22*  < > 1.99* 2.20* 2.14*  CALCIUM 9.3  --  8.9  --  9.0  MG 2.6*  --  2.4  --  2.5*  PHOS 5.8*  --   --   --   --   < > = values in this interval not displayed. Liver Function Tests:  Recent Labs  11/13/15 0430  ALBUMIN 2.6*   No results for input(s): LIPASE, AMYLASE in the last 72 hours. CBC:  Recent Labs  11/13/15  0430  11/14/15 1622 11/15/15 0420  WBC 10.4  --   --  13.2*  HGB 10.9*  < > 13.3 10.9*  HCT 36.6*  < > 39.0 36.1*  MCV 94.6  --   --  95.3  PLT 337  --   --  299  < > = values in this interval not displayed. Cardiac Enzymes: No results for input(s): CKTOTAL, CKMB, CKMBINDEX, TROPONINI in the last 72 hours. BNP: Invalid input(s): POCBNP D-Dimer: No results for input(s): DDIMER in the last 72 hours. Hemoglobin A1C: No results for input(s): HGBA1C in the last 72 hours. Fasting Lipid Panel: No results for input(s): CHOL, HDL, LDLCALC, TRIG, CHOLHDL, LDLDIRECT in the last 72 hours. Thyroid Function Tests: No results for input(s): TSH, T4TOTAL, T3FREE, THYROIDAB in the last 72 hours.  Invalid input(s): FREET3 Anemia Panel: No results for input(s): VITAMINB12, FOLATE, FERRITIN, TIBC, IRON, RETICCTPCT in the last 72 hours.  RADIOLOGY: Dg Chest 2 View  10/23/2015  CLINICAL DATA:  CHF.  Weakness. EXAM: CHEST  2 VIEW COMPARISON:  10/21/2015 FINDINGS: Multiple interstitial densities in the lower chest are suggestive for interstitial edema, right side greater than left. Heart size is upper limits of normal. Trachea is midline. Negative  for a pneumothorax. Evidence for a small right pleural effusion. IMPRESSION: Interstitial densities in the lower chest, right side greater than left. Findings are suggestive for interstitial pulmonary edema. Difficult to exclude some airspace disease in the right lower lung. Evidence for small right pleural effusion. Electronically Signed   By: Markus Daft M.D.   On: 10/23/2015 08:27   Dg Abd 1 View  11/03/2015  CLINICAL DATA:  Enteric tube advanced to post pyloric position. EXAM: ABDOMEN - 1 VIEW COMPARISON:  11/02/2015 abdominal radiographs. FINDINGS: Fluoroscopy time 7 minutes. Enteric tube courses through the stomach and duodenum and appears to terminate near the duodenal jejunal junction. Instilled contrast opacifies nondilated proximal jejunal small bowel loops. No contrast leak is demonstrated. Separate enteric tube terminates in the body of the stomach. Visualized lower sternotomy wires appear aligned and intact. IMPRESSION: Enteric tube appears to terminate near the duodenal jejunal junction. Separate enteric tube terminates in the body of the stomach. Electronically Signed   By: Ilona Sorrel M.D.   On: 11/03/2015 17:02   Dg Chest Port 1 View  11/15/2015  CLINICAL DATA:  Chronic ventilator dependent respiratory failure. CABG and right carotid endarterectomy 10/27/2015. EXAM: PORTABLE CHEST 1 VIEW COMPARISON:  11/14/2015 and earlier. FINDINGS: Tracheostomy tube tip in satisfactory position below the thoracic inlet. Left subclavian central venous catheter tip projects over the mid SVC, unchanged. Right arm PICC tip projects over lower SVC at or near the cavoatrial junction, unchanged. Feeding tube courses below the diaphragm into the stomach. Sternotomy for CABG. Cardiac silhouette moderately enlarged. Mild pulmonary venous hypertension without overt edema, unchanged. Dense consolidation in the left lower lobe with air bronchograms, unchanged. Moderate-sized left pleural effusion, unchanged. No new  pulmonary parenchymal abnormalities. IMPRESSION: 1.  Support apparatus satisfactory. 2. Stable dense left lower lobe atelectasis and/or pneumonia and left pleural effusion. 3. Stable cardiomegaly and pulmonary venous hypertension without overt edema. 4. No new abnormalities. Electronically Signed   By: Evangeline Dakin M.D.   On: 11/15/2015 07:22   Dg Chest Port 1 View  11/14/2015  CLINICAL DATA:  55 year old male with a history of shortness of breath. Patient admission 10/20/2015. Right carotid endarterectomy 10/27/2015, with same-day coronary artery bypass grafting 10/27/2015. Twelve days postop now with history of intra-aortic balloon  pump. EXAM: PORTABLE CHEST 1 VIEW COMPARISON:  11/13/2015, 11/12/2015, 11/11/2015 FINDINGS: Cardiomediastinal silhouette likely unchanged, with the left heart border partially obscured by overlying lung and pleural disease. Low lung volumes persist with bibasilar opacities and obscuration of the retrocardiac region. Obscuration of left hemidiaphragm. Unchanged tracheostomy tube, enteric feeding tube terminating out of the field of view, right upper extremity PICC and left subclavian central line. IMPRESSION: Similar appearance of the chest x-ray, with persisting bibasilar airspace opacities and left pleural effusion. Unchanged support apparatus as above. Signed, Dulcy Fanny. Earleen Newport, DO [facility] Electronically Signed   By: Corrie Mckusick D.O.   On: 11/14/2015 08:48   Dg Chest Port 1 View  11/13/2015  CLINICAL DATA:  Shortness of breath. EXAM: PORTABLE CHEST 1 VIEW COMPARISON:  November 12, 2015. FINDINGS: Stable cardiomegaly. Status post coronary bypass graft. Tracheostomy and feeding tube are unchanged. Bilateral subclavian catheters are unchanged. No pneumothorax is noted. Stable bibasilar opacities are noted with left greater than right. Stable left pleural effusion is noted. Bony thorax is unremarkable. IMPRESSION: Stable  support apparatus. Stable bibasilar opacities. Stable left pleural effusion. Electronically Signed   By: Marijo Conception, M.D.   On: 11/13/2015 07:37   Dg Chest Port 1 View  11/12/2015  CLINICAL DATA:  Respiratory failure. EXAM: PORTABLE CHEST 1 VIEW COMPARISON:  11/11/2015. FINDINGS: Tracheostomy tube, feeding tube, left IJ line, right PICC line in stable position . Prior CABG. Cardiomegaly with diffuse bilateral pulmonary infiltrates consistent pulmonary edema. Progressed from prior exam. Small bilateral pleural effusions. No pneumothorax. IMPRESSION: 1. Lines and tubes in stable position. 2. Prior CABG. Cardiomegaly with progressive bilateral pulmonary infiltrates consistent with pulmonary edema. Small bilateral pleural effusions are noted. Electronically Signed   By: Marcello Moores  Register   On: 11/12/2015 07:46   Dg Chest Port 1 View  11/11/2015  CLINICAL DATA:  Tracheostomy placement EXAM: PORTABLE CHEST 1 VIEW COMPARISON:  11/11/2015 FINDINGS: Tracheostomy tube has been placed with the tip projecting over the mid trachea. No pneumothorax. Right PICC line remains in place, unchanged as does left central line. Cardiomegaly with bilateral perihilar and lower lobe opacities, likely edema. Suspect layering effusions. IMPRESSION: Interval placement tracheostomy tube without pneumothorax. Suspected CHF. Bilateral effusions. Findings similar to prior study. Electronically Signed   By: Rolm Baptise M.D.   On: 11/11/2015 14:50   Dg Chest Port 1 View  11/11/2015  CLINICAL DATA:  Respiratory failure. EXAM: PORTABLE CHEST 1 VIEW COMPARISON:  11/10/2015. FINDINGS: Endotracheal tube, feeding tube, bilateral subclavian lines in stable position. Prior CABG. Cardiomegaly with diffuse bilateral pulmonary infiltrates consistent with pulmonary edema. Worsening from prior exam. Small left pleural effusion. No pneumothorax. IMPRESSION: 1.  Lines and tubes in stable position. 2. Prior CABG. Cardiomegaly with worsening bilateral  pulmonary edema. Small left pleural effusion. Electronically Signed   By: Marcello Moores  Register   On: 11/11/2015 07:17   Dg Chest Port 1 View  11/10/2015  CLINICAL DATA:  Shortness of breath . EXAM: PORTABLE CHEST 1 VIEW COMPARISON:  11/09/2015. FINDINGS: Endotracheal tube, NG tube, right PICC line, left subclavian line in stable position. Prior CABG. Cardiomegaly. Left lower lobe atelectasis and/or infiltrate with small left pleural effusion. Mild right base atelectasis and or infiltrate. No pneumothorax . IMPRESSION: 1.  Lines and tubes in stable position. 2. Left lower lobe atelectasis and/or infiltrate with small left pleural effusion. Mild right base atelectasis and/or infiltrate. Findings stable from prior exam. 3.  Prior CABG.  Stable cardiomegaly. Electronically Signed  By: [place]   On: 11/10/2015 07:51   Dg Chest Port 1 View  11/09/2015  CLINICAL DATA:  Atelectasis. EXAM: PORTABLE CHEST 1 VIEW COMPARISON:  11/08/2015 FINDINGS: Endotracheal tube in satisfactory position. Right arm PICC tip in the lower SVC unchanged. Left subclavian central venous catheter in the SVC. NG tube and feeding tube enters the stomach with the tips not visualized. Negative for pneumothorax. Left lower lobe airspace disease unchanged. Small left effusion unchanged. Mild pulmonary vascular congestion unchanged. IMPRESSION: Support lines remain in satisfactory position and unchanged Left lower lobe atelectasis/ infiltrate unchanged. Mild right lower lobe airspace consolidation stable. Mild vascular congestion unchanged. Electronically Signed   By: Franchot Gallo M.D.   On: 11/09/2015 07:36   Dg Chest Port 1 View  11/08/2015  CLINICAL DATA:  Respiratory failure, hypertension, coronary artery disease post MI and CABG EXAM: PORTABLE CHEST 1 VIEW COMPARISON:  Portable exam 1216 hours compared to 11/06/2015 FINDINGS: Tip of endotracheal tube projects 6.8 cm above carina. Nasogastric tube and feeding tube extends into stomach.  LEFT subclavian central venous catheter with tip projecting over SVC. RIGHT arm PICC line tip projects over SVC. Enlargement of cardiac silhouette post CABG. Stable mediastinal contours. Increased LEFT lower lobe consolidation. Slightly increased infiltrate at RIGHT base as well. Minimal central peribronchial thickening. No pleural effusion or pneumothorax. IMPRESSION: Enlargement of cardiac silhouette post CABG. Stable line and tube positions. Increased LEFT lower lobe consolidation with minimally increased infiltrate at RIGHT base as well. Findings are suspicious for pneumonia involving the LEFT lower lobe though a component of coexisting pulmonary edema may be present. Electronically Signed   By: Lavonia Dana M.D.   On: 11/08/2015 12:47   Dg Chest Port 1 View  11/06/2015  CLINICAL DATA:  Acute CHF secondary to acute MI, complete heart block. EXAM: PORTABLE CHEST 1 VIEW COMPARISON:  Portable chest x-ray of November 05, 2015 FINDINGS: The lungs are adequately inflated. The pulmonary interstitial markings are less prominent on the right today. They have improved on the left as well and the hemidiaphragm is now visible. There is persistent interstitial edema on the left. The cardiac silhouette remains enlarged. The intraaortic balloon pump marker tip projects over the proximal portion of descending thoracic aorta and appears stable. The endotracheal tube tip projects 5.5 cm above the carina. The nasogastric and Doppler AH feeding tubes have their tips below the inferior margin of the image. The right-sided PICC line tip projects over the junction of the middle and distal thirds of the SVC. The left subclavian venous catheter tip projects over the midportion of the SVC. IMPRESSION: Decreasing pulmonary edema consistent with improvement in CHF. Persistent perihilar interstitial edema on the left. The support tubes and devices are in reasonable position. Electronically Signed   By: David  Martinique M.D.   On: 11/06/2015  07:32   Dg Chest Port 1 View  11/05/2015  CLINICAL DATA:  Respiratory failure. EXAM: PORTABLE CHEST 1 VIEW COMPARISON:  11/04/2015 . FINDINGS: Endotracheal tube, NG tube, right PICC line stable position. Prior CABG. Cardiomegaly. Persistent left mid and lower lung field infiltrate. Persistent mild atelectatic changes right upper and right lower lobes. No pneumothorax. IMPRESSION: 1.  Lines and tubes in stable position. 2. Persistent left mid and left lower lung infiltrate, no interim change. Persistent right upper lobe and right lower lobe subsegmental atelectasis. No interim change. 3. Prior CABG.  Stable cardiomegaly. Electronically Signed   By: Marcello Moores  Register   On: 11/05/2015 07:48   Dg Chest  Port 1 View  11/04/2015  CLINICAL DATA:  Hypoxia EXAM: PORTABLE CHEST 1 VIEW COMPARISON:  November 04, 2015 FINDINGS: Endotracheal tube tip is 5.3 cm above the carina. Central catheter tip is in the superior vena cava near the cavoatrial junction. Nasogastric tube tip and side port are below the diaphragm. There also is a feeding tube with the tip below the diaphragm. No pneumothorax. There has been removal of a left chest tube. There is airspace consolidation throughout the left mid and lower lung zones, stable. There is mild atelectasis in the right upper lobe, stable. No new opacity is evident. There is stable cardiomegaly. The pulmonary vascularity is normal. No adenopathy is evident. IMPRESSION: Tube and catheter positions as described without pneumothorax. Airspace consolidation throughout the left mid and lower lung zones, stable. Atelectasis right upper lobe, stable. No new opacity. Stable cardiomegaly. Electronically Signed   By: Lowella Grip III M.D.   On: 11/04/2015 14:52   Dg Chest Port 1 View  11/04/2015  CLINICAL DATA:  Status post CABG 10/28/2015. EXAM: PORTABLE CHEST 1 VIEW COMPARISON:  Single view of the chest 11/03/2015 and 11/02/2015. FINDINGS: Support tubes and lines including a left chest  tube are unchanged since the most recent exam. The chest is better expanded today with decreased atelectasis. Airspace opacity in the left mid and lower lung zones persists. There is likely a left pleural effusion. Marked cardiomegaly is seen with only mild vascular congestion noted. IMPRESSION: Increased pulmonary expansion with decreased scattered atelectasis. Left mid and lower lung zone airspace disease could be due to atelectasis or pneumonia. Small left pleural effusion noted. Support tubes and lines projecting good position.  No pneumothorax. Electronically Signed   By: Inge Rise M.D.   On: 11/04/2015 07:30   Dg Chest Port 1 View  11/03/2015  CLINICAL DATA:  Central line placement. EXAM: PORTABLE CHEST 1 VIEW COMPARISON:  11/03/2015. FINDINGS: Left subclavian central line noted with tip at the cavoatrial junction. Interval removal of right chest tube. Endotracheal tube, NG tube, right PICC line, left IJ sheath left chest tube in stable position. No pneumothorax. Prior CABG. Stable cardiomegaly. Low lung volumes. A mild component of congestive heart failure cannot be excluded. Similar findings noted on prior exam P IMPRESSION: 1. Interim placement of left subclavian central line, its tip is at the cavoatrial junction. Interim removal right chest tube . No pneumothorax. 2. Remaining lines and tubes including left chest tube in stable position. 3. Prior CABG. Stable cardiomegaly. Low lung volumes. A a mild component congestive heart failure cannot be excluded . Similar findings noted on prior exam. Electronically Signed   By: [place]   On: 11/03/2015 09:58   Dg Chest Port 1 View  11/03/2015  CLINICAL DATA:  Status post CABG on October 27, 2015 persistent left lower lobe atelectasis or pneumonia. No pneumothorax or significant pleural effusion. EXAM: PORTABLE CHEST 1 VIEW COMPARISON:  Portable chest x-ray of November 02, 2015 at 5:48 a.m. FINDINGS: There has been interval placement of an intra  aortic balloon pump whose marker lies over the junction of the aortic arch with the proximal descending thoracic aorta. The cardiac silhouette remains enlarged. The pulmonary vascularity remains mildly prominent. The retrocardiac region on the left remains dense. Bilateral chest tubes are in stable position. There is no pneumothorax nor significant pleural effusion. The endotracheal tube tip lies approximately 6.5 cm above the carina. The esophagogastric tube tip projects below the inferior margin of the image. The right-sided PICC line tip  projects over the midportion of the SVC. A previously demonstrated pericardial drain is not clearly evident on today's study. IMPRESSION: Mild CHF. Persistent left lower lobe atelectasis or pneumonia. Interval placement of an intra aortic balloon pump which appears to be in appropriate position radiographically. The other support tubes and lines are in reasonable position. Electronically Signed   By: David  Martinique M.D.   On: 11/03/2015 07:27   Dg Chest Port 1 View  11/02/2015  CLINICAL DATA:  CABG. EXAM: PORTABLE CHEST 1 VIEW COMPARISON:  11/01/2015. FINDINGS: Endotracheal tube, NG tube, right PICC line, bilateral chest tubes in stable position. Prior CABG. Stable cardiomegaly . Persistent mild interstitial prominence, congestive heart failure cannot be excluded. No interim change. Low lung volumes with basilar atelectasis . IMPRESSION: 1. Endotracheal tube, NG tube, right PICC line, bilateral chest tubes in stable position . No pneumothorax. 2. Prior CABG. Cardiomegaly with pulmonary interstitial prominence and small left pleural effusion consistent with mild congestive heart failure. No interim change from prior exam . Electronically Signed   By: Marcello Moores  Register   On: 11/02/2015 07:20   Dg Chest Port 1 View  11/01/2015  CLINICAL DATA:  Post CABG EXAM: PORTABLE CHEST 1 VIEW COMPARISON:  10/31/2015 FINDINGS: Cardiomediastinal silhouette is stable. Status post CABG. Stable  endotracheal and NG tube position. Left chest tube is unchanged in position. Persistent central vascular congestion and mild interstitial prominence bilaterally suspicious for the mild interstitial edema. Left IJ sheath in place. Probable small left pleural effusion left basilar atelectasis or infiltrate. Right arm PICC line is unchanged in position. There is no pneumothorax. IMPRESSION: Status post CABG. Stable support apparatus. Stable left chest tube position. No pneumothorax. Again noted bilateral mild interstitial prominence and perihilar opacities suspicious for mild pulmonary edema. Probable small left pleural effusion left basilar atelectasis or infiltrate. Electronically Signed   By: Lahoma Crocker M.D.   On: 11/01/2015 09:57   Dg Chest Port 1 View  10/31/2015  CLINICAL DATA:  55 year old male with a history of endotracheal tube placement Patient has undergone coronary artery bypass grafting x4 10/27/2015. Placement of intra aortic balloon pump at this date. Ischemic cardiomyopathy. EXAM: PORTABLE CHEST 1 VIEW COMPARISON:  10/30/2015. FINDINGS: Endotracheal tube terminates approximately 3.4 cm above the carina, unchanged. Gastric tube terminates out of the field of view. Defibrillator pads project over the left and right chest. Bilateral thoracostomy tubes. Right upper extremity PICC. The mediastinal drain not visualized. Intra-aortic balloon pump not visualized. Surgical changes of median sternotomy and CABG. Mixed bilateral interstitial and airspace opacities. Low lung volumes. IMPRESSION: Low lung volumes with mixed bilateral interstitial and airspace opacities, similar to the comparison chest x-ray. Retrocardiac opacity may reflect persistent pleural fluid, and/or atelectasis. Surgical support apparatus appear unchanged, as above. Intra aortic balloon pump not visualized. Signed, Dulcy Fanny. Earleen Newport, DO [facility] Electronically Signed   By: Corrie Mckusick D.O.   On: 10/31/2015 07:25   Dg Chest Port 1 View  10/30/2015  CLINICAL DATA:  Post cavity.  Recent CABG. EXAM: PORTABLE CHEST 1 VIEW COMPARISON:  Portable film earlier today. FINDINGS: Overlying support apparatus redemonstrated. Tubes and lines remain stable. Elevated LEFT hemidiaphragm. No definite pneumothorax. Moderate vascular congestion may be increased. Cardiomegaly. IMPRESSION: Slight worsening aeration. Moderate vascular congestion may be increased. Electronically Signed   By: Staci Righter M.D.   On: 10/30/2015 18:39   Dg Chest Port 1 View  10/30/2015  CLINICAL DATA:  Status post coronary bypass grafting EXAM: PORTABLE  CHEST 1 VIEW COMPARISON:  10/30/2015 FINDINGS: Cardiac shadow remains enlarged. Bilateral chest tubes are again seen. A right-sided PICC line and endotracheal tube are noted in satisfactory position. Left jugular sheath is noted. Elevation of left hemidiaphragm is noted with left basilar atelectasis. No pneumothorax is noted. IMPRESSION: Postsurgical change with tubes and lines as described. Mild left basilar atelectasis. Electronically Signed   By: Inez Catalina M.D.   On: 10/30/2015 07:45   Dg Chest Port 1 View  10/30/2015  CLINICAL DATA:  A chest tube in place EXAM: PORTABLE CHEST 1 VIEW COMPARISON:  10/30/2015 FINDINGS: Postoperative changes in the mediastinum. Endotracheal tube with tip measuring 6.3 cm about the carina. Right PICC catheter with tip over the cavoatrial junction. Left central venous catheter sheath with tip over the left neck consistent location in the internal jugular vein. Bilateral chest tubes. Infiltration or atelectasis in the right lung base. Cardiac enlargement. No significant vascular congestion. No pneumothorax. IMPRESSION: Appliance positioned as described. Cardiac enlargement with infiltration or atelectasis in the right lung base. Bilateral chest tubes are present but no visualized pneumothorax. Electronically Signed   By: Lucienne Capers  M.D.   On: 10/30/2015 03:07   Dg Chest Port 1 View  10/30/2015  CLINICAL DATA:  Shortness of breath.  Code blue. EXAM: PORTABLE CHEST 1 VIEW COMPARISON:  10/29/2015 FINDINGS: Postoperative changes in the mediastinum. Bilateral chest tubes are present. Right PICC line with tip over the cavoatrial junction. Left central venous catheter or sheath with tip over the left side of the neck, likely in the left internal jugular vein. Cardiac enlargement without significant vascular congestion. Shallow inspiration with elevation of the left hemidiaphragm. Probable left pleural effusion. No definite pulmonary consolidation. IMPRESSION: Shallow inspiration. Probable left pleural effusion. Cardiac enlargement. Appliances appear in satisfactory position. Electronically Signed   By: Lucienne Capers M.D.   On: 10/30/2015 01:18   Dg Chest Port 1 View  10/29/2015  CLINICAL DATA:  Postop CABG 2 days ago. EXAM: PORTABLE CHEST 1 VIEW COMPARISON:  Portable chest x-ray of October 28, 2015 FINDINGS: The trachea and esophagus have been extubated. The cardiac silhouette remains enlarged. The pulmonary vascularity is mildly engorged. The bilateral chest tubes and the mediastinal drain are in stable position. There is no pneumothorax or large pleural effusion. The Swan-Ganz catheter tip overlies a proximal right lower lobe pulmonary artery branch. The mediastinum is widened and accentuated by the hypo inflation. IMPRESSION: Bilateral hypoinflation following extubation of the trachea there is crowding of the pulmonary vascularity. Left basilar atelectasis or less likely pneumonia is more prominent today. Probable small left pleural effusion. The remaining support tubes and lines are in stable position. Electronically Signed   By: David  Martinique M.D.   On: 10/29/2015 07:40   Dg Chest Port 1 View  10/28/2015  CLINICAL DATA:  Post CABG, on ventilator, followup portable chest x-ray of 10/28/2015 EXAM: PORTABLE CHEST 1 VIEW COMPARISON:  None.  FINDINGS: The tip of the endotracheal tube is approximately 5.2 cm above the carina. Aeration has improved slightly. Atelectasis remains at the lung bases left-greater-than-right. Swan-Ganz catheter tip is in the right lower lobe pulmonary artery and bilateral chest tubes remain. No pneumothorax is seen. Heart size is stable. IMPRESSION: 1. Improved aeration. 2. Bilateral chest tubes.  No pneumothorax. 3. Tip of endotracheal tube approximately 5.2 cm above the carina. Electronically Signed   By: Ivar Drape M.D.   On: 10/28/2015 08:01   Dg Chest Port 1 View  10/27/2015  CLINICAL DATA:  Status post CABG EXAM: PORTABLE CHEST 1 VIEW COMPARISON:  10/27/2015 FINDINGS: Unchanged tracheostomy tube. Swan-Ganz central venous line is now seen with tip advanced into the right perihilar region. Stable right chest tube with and left chest tube. NG tube again crosses the gastroesophageal junction. Stable mediastinal drain. Enlarged cardiac silhouette. Limited inspiratory effect with bibasilar opacities likely representing atelectasis. IMPRESSION: Anticipated postoperative appearance with bibasilar atelectasis. Electronically Signed   By: Skipper Cliche M.D.   On: 10/27/2015 19:25   Dg Chest Portable 1 View  10/27/2015  CLINICAL DATA:  Status post coronary bypass grafting EXAM: PORTABLE CHEST 1 VIEW COMPARISON:  10/23/2015 FINDINGS: Cardiac shadow is mildly enlarged. A Swan-Ganz catheter is noted in the right pulmonary outflow tract. Bilateral thoracostomy catheters are seen. No pneumothorax is noted. An endotracheal tube is noted in satisfactory position. Intra-aortic balloon pump is noted over the aortic arch. Mild left basilar atelectasis is seen. Mild central vascular congestion is noted. IMPRESSION: Postoperative changes with tubes and lines as described above. Mild vascular congestion and left basilar atelectasis. Electronically Signed   By: Inez Catalina M.D.   On: 10/27/2015 17:23   Portable Chest X-ray 1  View  10/21/2015  CLINICAL DATA:  55 year old male with a history of acute systolic congestive heart failure EXAM: PORTABLE CHEST 1 VIEW COMPARISON:  10/20/2015 FINDINGS: Heart size unchanged, enlarged. Similar appearance of low lung volumes with interstitial opacities, interlobular septal thickening, and developing airspace disease of the bilateral lower lungs. No pneumothorax. Linear opacity overlying the mediastinum and the heart border, of on certain significance. Favored to overlie the patient. IMPRESSION: Evidence of worsening congestive heart failure, with increasing edema. New linear opacity overlies the mediastinum, favored to be overlying the patient, though cannot be localized on this view. Signed, Dulcy Fanny. Earleen Newport, DO [facility] Electronically Signed   By: Corrie Mckusick D.O.   On: 10/21/2015 07:16   Dg Chest Portable 1 View  10/20/2015  CLINICAL DATA:  Shortness of breath. EXAM: PORTABLE CHEST 1 VIEW COMPARISON:  None. FINDINGS: Midline trachea. Cardiomegaly accentuated by AP portable technique. No pleural effusion or pneumothorax. Mildly low lung volumes with bibasilar atelectasis. IMPRESSION: Mild cardiomegaly and low lung volumes.  No acute findings. Electronically Signed   By: Abigail Miyamoto M.D.   On: 10/20/2015 18:09   Dg Abd Portable 1v  11/15/2015  CLINICAL DATA:  Chronic ventilator dependent respiratory failure. Acute onset of nausea and vomiting. EXAM: PORTABLE ABDOMEN - 1 VIEW COMPARISON:  11/03/2015, 11/02/2015. FINDINGS: Feeding tube tip projects at the expected location of the 4th portion of the duodenum at or near the ligament Treitz. Nonobstructive bowel gas pattern. Gas in normal caliber loops of small bowel throughout the abdomen and pelvis. Gas throughout normal caliber colon from cecum to rectum. No suggestion of free intraperitoneal air. IMPRESSION: 1. Bowel gas pattern indicating a mild generalized ileus. No evidence of  bowel obstruction. 2. Feeding tube tip appropriately positioned in the 4th portion of the duodenum at or near the ligament of Treitz. Electronically Signed   By: Evangeline Dakin M.D.   On: 11/15/2015 07:26   Dg Abd Portable 1v  11/02/2015  CLINICAL DATA:  Assess feeding tube positioning EXAM: PORTABLE ABDOMEN - 1 VIEW COMPARISON:  None in PACs FINDINGS: The repeated KUB with has less motion artifact. The tip of the feeding tube and nasogastric tube lie in the region of the distal gastric body in the pre-pyloric region. The bowel gas pattern is within the  limits of normal. Numerous tubes and lines and electrodes overlie the abdomen. IMPRESSION: The tips of the feeding tube and the esophagogastric tube lie in the region of the distal gastric body in the pre-pyloric region. Electronically Signed   By: David  Martinique M.D.   On: 11/02/2015 12:16   Dg Addison Bailey G Tube Plc W/fl-no Rad  11/03/2015  CLINICAL DATA:  NASO G TUBE PLACEMENT WITH FLUORO Fluoroscopy was utilized by the requesting physician.  No radiographic interpretation.    PHYSICAL EXAM General: Sitting up in bed  Neck: JVP hard to see.  no thyromegaly or thyroid nodule.  S/p Trach  Lungs: mild rhonchi   CV: Nondisplaced PMI.  Heart regular S1/S2, no S3/S4, no murmur. 1+ edema. Abdomen: Distended. Nontender. Good BS.   Neurologic: alert and oriented x 3 Extremities: No clubbing or cyanosis.  Dopplerable pedal pulses. Maculopapular rash on his sides  TELEMETRY: A flutter 60-70s   ASSESSMENT AND PLAN:  1. CAD: S/p late presentation inferior MI and CABG x 4. Continue statin, ASA. Coronary angiography 3/27 with multiple VT episodes showed patent LIMA-LAD and SVG-RCA.  Patent SVG-ramus and SVG-D but slow flow in both grafts with diffusely diseased small target vessels.  2. Cardiogenic shock: Acute systolic CHF with RV failure post inferior MI with suspected RV involvement. EF 25-30% on echo (difficult images on 3/24 echo). He has significant RV  failure from suspected RV infarct.  3/27 IABP placed to try to allow decrease in milrinone with frequent VT.  3/31 echo with EF 30-35%.  IABP out 4/2.  Milrinone off 4/7 - Off milrinone. Co-ox 65% . Will stop checking co-ox. - CVP remains up. Now on daily torsemide.  Trying to balance with hypernatremia. Still on free H2O for hypernatremia.  - No spiro/dig with CKD.  - He is now off NO and on sildenafil 40 tid.   -Continue hydralazine to 50 mg tid for afterload reduction (no Imdur with sildenafil use).   3. Complete heart block: Pacemaker turned off 4/3.  Underlying NSR in 70s. Now in AFL. Plan TEE/DC-CV this week. Possibly tomorrow or Wednesday.  4. VF arrest 3/24 early am and again at night 3/24.  Only occasional PVCs today.  - Change amio to po.  5. AKI: Creatinine up slightly now at 2.4.Suspect ATN. Baseline creatinine 1.0. Renal following 6. ID: Possible LLL PNA.  He completed course of  vancomycin/levofloxacin.  7. Anemia: No overt bleeding.  Got 2 units PRBCs on 4/4. Hemoglobin 11>10.9.  8. Pulmonary: S/P Trach 4/5 on trach collar trials. 9. Hypernatremia: Sodium climbing despite free water boluses. Sodium 158. Will start gentle D5W at 150/hr. Follow sodium closely. Will likely need to add lasix as well.  10. AFL - On heparin and IV amio. Will need TEE/DCCV this week.  11. Ileus - continue Reglan. Ambulate as tolerated.   Glori Bickers MD  11/15/2015 1:06 PM

## 2015-11-15 NOTE — Progress Notes (Signed)
RT note-placed back to full support, ventilator changed out for routine maintenance

## 2015-11-15 NOTE — Progress Notes (Signed)
50 ml of bag of fentanyl wasted in sink as witnessed by Darnelle Going, RN.  Christopher [name]

## 2015-11-16 ENCOUNTER — Inpatient Hospital Stay (HOSPITAL_COMMUNITY): Payer: BLUE CROSS/BLUE SHIELD

## 2015-11-16 DIAGNOSIS — E870 Hyperosmolality and hypernatremia: Secondary | ICD-10-CM

## 2015-11-16 LAB — BASIC METABOLIC PANEL
ANION GAP: 13 (ref 5–15)
Anion gap: 12 (ref 5–15)
BUN: 104 mg/dL — ABNORMAL HIGH (ref 6–20)
BUN: 102 mg/dL — AB (ref 6–20)
CALCIUM: 8.9 mg/dL (ref 8.9–10.3)
CO2: 30 mmol/L (ref 22–32)
CO2: 28 mmol/L (ref 22–32)
Calcium: 9.1 mg/dL (ref 8.9–10.3)
Chloride: 116 mmol/L — ABNORMAL HIGH (ref 101–111)
Chloride: 113 mmol/L — ABNORMAL HIGH (ref 101–111)
Creatinine, Ser: 2.42 mg/dL — ABNORMAL HIGH (ref 0.61–1.24)
Creatinine, Ser: 2.41 mg/dL — ABNORMAL HIGH (ref 0.61–1.24)
GFR calc Af Amer: 33 mL/min — ABNORMAL LOW (ref >60)
GFR calc Af Amer: 33 mL/min — ABNORMAL LOW (ref >60)
GFR calc non Af Amer: 29 mL/min — ABNORMAL LOW (ref >60)
GFR calc non Af Amer: 29 mL/min — ABNORMAL LOW (ref >60)
GLUCOSE: 129 mg/dL — AB (ref 65–99)
Glucose, Bld: 119 mg/dL — ABNORMAL HIGH (ref 65–99)
POTASSIUM: 3.8 mmol/L (ref 3.5–5.1)
Potassium: 4 mmol/L (ref 3.5–5.1)
Sodium: 158 mmol/L — ABNORMAL HIGH (ref 135–145)
Sodium: 154 mmol/L — ABNORMAL HIGH (ref 135–145)

## 2015-11-16 LAB — CARBOXYHEMOGLOBIN
Carboxyhemoglobin: 1.5 % (ref 0.5–1.5)
Methemoglobin: 0.7 % (ref 0.0–1.5)
O2 Saturation: 65.1 %
Total hemoglobin: 11.1 g/dL — ABNORMAL LOW (ref 13.5–18.0)

## 2015-11-16 LAB — GLUCOSE, CAPILLARY
GLUCOSE-CAPILLARY: 105 mg/dL — AB (ref 65–99)
GLUCOSE-CAPILLARY: 108 mg/dL — AB (ref 65–99)
GLUCOSE-CAPILLARY: 97 mg/dL (ref 65–99)
Glucose-Capillary: 102 mg/dL — ABNORMAL HIGH (ref 65–99)
Glucose-Capillary: 98 mg/dL (ref 65–99)
Glucose-Capillary: 85 mg/dL (ref 65–99)

## 2015-11-16 LAB — POCT I-STAT 3, ART BLOOD GAS (G3+)
Acid-Base Excess: 16 mmol/L — ABNORMAL HIGH (ref 0.0–2.0)
Bicarbonate: 38.4 mEq/L — ABNORMAL HIGH (ref 20.0–24.0)
O2 Saturation: 97 %
PCO2 ART: 37.9 mmHg (ref 35.0–45.0)
PO2 ART: 74 mmHg — AB (ref 80.0–100.0)
Patient temperature: 98.6
TCO2: 40 mmol/L (ref 0–100)
pH, Arterial: 7.615 (ref 7.350–7.450)

## 2015-11-16 LAB — C DIFFICILE QUICK SCREEN W PCR REFLEX
C Diff antigen: NEGATIVE
C Diff interpretation: NEGATIVE
C Diff toxin: NEGATIVE

## 2015-11-16 LAB — CBC
HCT: 35.8 % — ABNORMAL LOW (ref 39.0–52.0)
Hemoglobin: 10.4 g/dL — ABNORMAL LOW (ref 13.0–17.0)
MCH: 27.6 pg (ref 26.0–34.0)
MCHC: 29.1 g/dL — ABNORMAL LOW (ref 30.0–36.0)
MCV: 95 fL (ref 78.0–100.0)
Platelets: 265 10*3/uL (ref 150–400)
RBC: 3.77 MIL/uL — ABNORMAL LOW (ref 4.22–5.81)
RDW: 18 % — ABNORMAL HIGH (ref 11.5–15.5)
WBC: 12.2 10*3/uL — ABNORMAL HIGH (ref 4.0–10.5)

## 2015-11-16 LAB — URINE CULTURE
Culture: NO GROWTH
Special Requests: NORMAL

## 2015-11-16 LAB — MAGNESIUM: Magnesium: 2.6 mg/dL — ABNORMAL HIGH (ref 1.7–2.4)

## 2015-11-16 LAB — HEPARIN LEVEL (UNFRACTIONATED): Heparin Unfractionated: 0.16 IU/mL — ABNORMAL LOW (ref 0.30–0.70)

## 2015-11-16 MED ORDER — METOCLOPRAMIDE HCL 5 MG/ML IJ SOLN
10.0000 mg | Freq: Once | INTRAMUSCULAR | Status: AC
  Administered 2015-11-16: 10 mg via INTRAVENOUS
  Filled 2015-11-16: qty 2

## 2015-11-16 MED ORDER — DEXTROSE 5 % IV SOLN
INTRAVENOUS | Status: AC
  Administered 2015-11-16 (×2): via INTRAVENOUS

## 2015-11-16 MED ORDER — AMIODARONE HCL 200 MG PO TABS
200.0000 mg | ORAL_TABLET | Freq: Two times a day (BID) | ORAL | Status: DC
  Administered 2015-11-16 – 2015-11-24 (×16): 200 mg via ORAL
  Filled 2015-11-16 (×17): qty 1

## 2015-11-16 NOTE — Progress Notes (Signed)
14 Days Post-Op Procedure(s) (LRB): Right/Left Heart Cath and Coronary/Graft Angiography (N/A) Intra-Aortic Balloon Pump Insertion Subjective: Patient remains in atrial fib-flutter on heparin Cardiology hopefully will be able to DC cardiovert  back to sinus rhythm- leave temporary pacing wires in place  Acute renal failure improved now-renal sign off. Continue Lasix IV 40 mg daily  Acute respiratory failure improving-on trach collar trials and ambulating today 100 feeet  Patient has had vague abdominal complaints for the past 48 hours with an unremarkable KUB-as he feels bloated and full. We will check C. Difficile toxin.  Once patient is ventilator independent we'll check swallow status-until then continue postpyloric tube feedings Objective: Vital signs in last 24 hours: Temp:  [98.2 F (36.8 C)-99.1 F (37.3 C)] 99.1 F (37.3 C) (04/10 1514) Pulse Rate:  [36-81] 78 (04/10 1700) Cardiac Rhythm:  [-] Atrial flutter (04/10 1700) Resp:  [12-26] 13 (04/10 1700) BP: (100-127)/(61-90) 124/84 mmHg (04/10 1700) SpO2:  [94 %-100 %] 97 % (04/10 1700) FiO2 (%):  [40 %] 40 % (04/10 1700) Weight:  [226 lb 13.7 oz (102.9 kg)] 226 lb 13.7 oz (102.9 kg) (04/10 0428)  Hemodynamic parameters for last 24 hours: CVP:  [9 mmHg-22 mmHg] 18 mmHg  Intake/Output from previous day: 04/09 0701 - 04/10 0700 In: 3323.3 [I.V.:658.3; HF/GB:0211; IV Piggyback:100] Out: 2850 [Urine:2550; Stool:300] Intake/Output this shift: Total I/O In: 1984.9 [I.V.:1699.9; NG/GT:285] Out: 1105 [Urine:1105]  Alert and responsive Mild peripheral edema Extremities warm Abdomen nontender  Lab Results:  Recent Labs  11/15/15 0420 11/16/15 0400  WBC 13.2* 12.2*  HGB 10.9* 10.4*  HCT 36.1* 35.8*  PLT 299 265   BMET:  Recent Labs  11/16/15 0400 11/16/15 1255  NA 158* 154*  K 4.0 3.8  CL 116* 113*  CO2 30 28  GLUCOSE 119* 129*  BUN 104* 102*  CREATININE 2.42* 2.41*  CALCIUM 9.1 8.9    PT/INR: No  results for input(s): LABPROT, INR in the last 72 hours. ABG    Component Value Date/Time   PHART 7.419 11/13/2015 0526   HCO3 35.5* 11/13/2015 0526   TCO2 28 11/14/2015 1622   ACIDBASEDEF TEST WILL BE CREDITED 11/01/2015 0446   O2SAT 65.1 11/16/2015 0440   CBG (last 3)   Recent Labs  11/16/15 0708 11/16/15 1124 11/16/15 1513  GLUCAP 98 105* 108*    Assessment/Plan: S/P Procedure(s) (LRB): Right/Left Heart Cath and Coronary/Graft Angiography (N/A) Intra-Aortic Balloon Pump Insertion Mobilize Diuresis continue ventilator wean on trach collar   LOS: 27 days    Kathlee Nations Trigt III 11/16/2015

## 2015-11-16 NOTE — Progress Notes (Signed)
RT note-Placed back to ventilator cpap/ps for rest. Tolerating well and remains in the chair.

## 2015-11-16 NOTE — Progress Notes (Signed)
PT Cancellation Note  Patient Details Name: Christopher [name] MRN: [id number] DOB: 05-09-1961   Cancelled Treatment:    Reason Eval/Treat Not Completed: Fatigue/lethargy limiting ability to participate. Pt did trach collar trials this morning and is back on ventilator. Per RN pt is very fatigued at this time. Will follow up in PM today.   Everlean Cherry, SPT Everlean Cherry 11/16/2015, 11:50 AM

## 2015-11-16 NOTE — Progress Notes (Signed)
RT note- Patient walked on ATC 55%, maintained sp02 98%. Back to bed and placed back to full support for rest period. Tolerated walk well.

## 2015-11-16 NOTE — Procedures (Signed)
CVP line and setup changed without complications.

## 2015-11-16 NOTE — Progress Notes (Signed)
Physical Therapy Treatment Patient Details Name: Christopher [name] MRN: [id number] DOB: 1960-09-13 Today's Date: 11/16/2015    History of Present Illness [name] w/ ischemic cardiomyopathy following MI. Patient underwent CABG & CEA. Post-operative course complicated by arrhythmia and biventricular failure, HCAP, heart block, trach placement.    PT Comments    Pt improved with activity tolerance and ambulation with therapy today. Pt able to ambulate 140 feet with a sitting rest break in between on trach collar 55% and O2 Sats 95% or greater throughout ambulation, but RR increased to 33 with ambulation, improves with sitting rest break. Pt continues to benefit from PT services to improve functional mobility. Pt would also benefit from CIR to increase functional independence before returning home.    Follow Up Recommendations  CIR;Supervision/Assistance - 24 hour     Equipment Recommendations  Other (comment) (TBD)    Recommendations for Other Services Rehab consult;OT consult     Precautions / Restrictions Precautions Precautions: Sternal;Fall Restrictions Weight Bearing Restrictions: Yes Other Position/Activity Restrictions: Sternal preacutions    Mobility  Bed Mobility Overal bed mobility: Needs Assistance Bed Mobility: Sit to Supine       Sit to supine: +2 for physical assistance;HOB elevated;Min assist   General bed mobility comments: Min A of 2 to control trunk decent and help get legs into the bed.   Transfers Overall transfer level: Needs assistance Equipment used: None Transfers: Sit to/from Stand Sit to Stand: Min guard         General transfer comment: Min Guard for safety. VC's for hand placement and sequencing.   Ambulation/Gait Ambulation/Gait assistance: Min assist;+2 safety/equipment Ambulation Distance (Feet): 140 Feet (70+70 with sitting rest break. ) Assistive device:  (pushed w/c) Gait Pattern/deviations: Step-through pattern;Decreased  stride length;Trunk flexed Gait velocity: decreased Gait velocity interpretation: Below normal speed for age/gender General Gait Details: Min Assist to steer w/c. +2 for chair follow and for equipment. Pt's RR increased to 33 halfway through the walk and recovered to 18 in sitting.    Stairs            Wheelchair Mobility    Modified Rankin (Stroke Patients Only)       Balance Overall balance assessment: Needs assistance Sitting-balance support: Feet supported;Bilateral upper extremity supported Sitting balance-Leahy Scale: Fair     Standing balance support: Bilateral upper extremity supported Standing balance-Leahy Scale: Poor Standing balance comment: Reliant on UE support.                     Cognition Arousal/Alertness: Awake/alert Behavior During Therapy: WFL for tasks assessed/performed Overall Cognitive Status: Difficult to assess                      Exercises      General Comments General comments (skin integrity, edema, etc.): Pt very eager to work with therapy.       Pertinent Vitals/Pain Pain Assessment: No/denies pain  Vitals stable throughout activity on trach collar 55%, 15L. RT placed pt back on vent after activity to rest on 40% FiO2, PEEP 5.     Home Living                      Prior Function            PT Goals (current goals can now be found in the care plan section) Acute Rehab PT Goals Patient Stated Goal: Unable to verbalize at this time. PT Goal Formulation:  With patient Time For Goal Achievement: 11/29/15 Potential to Achieve Goals: Good Progress towards PT goals: Progressing toward goals    Frequency  Min 3X/week    PT Plan Current plan remains appropriate    Co-evaluation             End of Session Equipment Utilized During Treatment: Gait belt;Oxygen Activity Tolerance: Patient tolerated treatment well Patient left: in bed;with call bell/phone within reach;with bed alarm set;with  nursing/sitter in room     Time: 7622-6333 PT Time Calculation (min) (ACUTE ONLY): 21 min  Charges:  $Gait Training: 8-22 mins                    G Codes:      Everlean Cherry, SPT Everlean Cherry 11/16/2015, 5:01 PM

## 2015-11-16 NOTE — Progress Notes (Signed)
PULMONARY / CRITICAL CARE MEDICINE   Name: Christopher [name] MRN: [id number] DOB: Aug 29, 1960    ADMISSION DATE:  10/20/2015 CONSULTATION DATE:  11/03/2015  REFERRING MD: Dr. Donata Clay / CVTS  CHIEF COMPLAINT:  Acute Hypoxic Respiratory Failure  HISTORY OF PRESENT ILLNESS:   [name] w/ ischemic cardiomyopathy following MI. Patient underwent CABG & CEA. Post-operative course complicated by arrhythmia and biventricular failure, HCAP, heart block. Improving  SUBJECTIVE: No issues overnight. Tolerating low dose tube feeds.   REVIEW OF SYSTEMS:  Feels well. Denies any nausea, vomiting No chest pain, palpitations No dyspnea, cough, wheezing.   VITAL SIGNS: BP 121/79 mmHg  Pulse 76  Temp(Src) 98.2 F (36.8 C) (Oral)  Resp 17  Ht 5' 10.5" (1.791 m)  Wt 226 lb 13.7 oz (102.9 kg)  BMI 32.08 kg/m2  SpO2 99%  HEMODYNAMICS: CVP:  [9 mmHg-22 mmHg] 9 mmHg  VENTILATOR SETTINGS: Vent Mode:  [-] PRVC FiO2 (%):  [40 %] 40 % Set Rate:  [12 bmp] 12 bmp Vt Set:  [590 mL] 590 mL PEEP:  [5 cmH20] 5 cmH20 Pressure Support:  [8 cmH20] 8 cmH20 Plateau Pressure:  [19 cmH20-22 cmH20] 21 cmH20  INTAKE / OUTPUT: I/O last 3 completed shifts: In: 5223.7 [I.V.:1098.7; NG/GT:4025; IV Piggyback:100] Out: 4095 [Urine:3795; Stool:300]  PHYSICAL EXAMINATION: General:  Comfortable. Sitting up in chair Neuro:  Movers all 4 extremities. Grossly nonfocal. HEENT: Trach in place. No JVD, thyomegaly Cardiovascular:  Regular rate. No MRG Lungs:  Clear, no wheezing, crackles Abdomen:  Soft. Protuberant. Normal BS. Nontender. Integument:  No rash  LABS:  BMET  Recent Labs Lab 11/14/15 0415 11/14/15 1622 11/15/15 0420 11/16/15 0400  NA 152* 152* 153* 158*  K 3.8 4.6 4.1 4.0  CL 107 109 110 116*  CO2 31  --  30 30  BUN 102* 104* 106* 104*  CREATININE 1.99* 2.20* 2.14* 2.42*  GLUCOSE 124* 157* 123* 119*   Electrolytes  Recent Labs Lab 11/11/15 0320 11/12/15 0400 11/13/15 0430  11/14/15 0415 11/15/15 0420 11/16/15 0400  CALCIUM 9.6 9.5 9.3 8.9 9.0 9.1  MG 2.8* 2.5* 2.6* 2.4 2.5* 2.6*  PHOS 5.9*  5.7* 4.3 5.8*  --   --   --    CBC  Recent Labs Lab 11/13/15 0430  11/14/15 1622 11/15/15 0420 11/16/15 0400  WBC 10.4  --   --  13.2* 12.2*  HGB 10.9*  < > 13.3 10.9* 10.4*  HCT 36.6*  < > 39.0 36.1* 35.8*  PLT 337  --   --  299 265  < > = values in this interval not displayed. Coag's  Recent Labs Lab 11/11/15 0940  APTT 29  INR 1.44    Sepsis Markers No results for input(s): LATICACIDVEN, PROCALCITON, O2SATVEN in the last 168 hours. ABG  Recent Labs Lab 11/12/15 0451 11/12/15 0457 11/13/15 0526  PHART 7.615* 7.611* 7.419  PCO2ART 37.9 37.9 55.2*  PO2ART 74.0* 74.0* 68.0*    Liver Enzymes  Recent Labs Lab 11/10/15 0330 11/10/15 1040 11/11/15 0320 11/12/15 0400 11/13/15 0430  AST 47* 50*  --   --   --   ALT 52 56  --   --   --   ALKPHOS 75 79  --   --   --   BILITOT 0.6 1.0  --   --   --   ALBUMIN 2.5* 2.8* 2.8* 2.5* 2.6*    Cardiac Enzymes No results for input(s): TROPONINI, PROBNP in the last 168 hours.  Glucose  Recent Labs Lab 11/15/15 1121 11/15/15 1521 11/15/15 2021 11/16/15 0033 11/16/15 0421 11/16/15 0708  GLUCAP 105* 105* 87 97 102* 98   Imaging Dg Chest Port 1 View  11/16/2015  CLINICAL DATA:  Follow-up respiratory failure EXAM: PORTABLE CHEST 1 VIEW COMPARISON:  11/15/2014 FINDINGS: Prior CABG. Tracheostomy tube, right PICC line and left subclavian central line remain in place, unchanged. There is cardiomegaly with vascular congestion. Continued left lower lobe atelectasis or pneumonia and suspect small left pleural effusion. No significant change since prior study. No confluent opacity on the right. IMPRESSION: Stable left lower lobe atelectasis or pneumonia with small left effusion. Stable cardiomegaly with vascular congestion. Electronically Signed   By: Charlett Nose M.D.   On: 11/16/2015 07:27   Dg Abd  Portable 1v  11/16/2015  CLINICAL DATA:  Ileus, acute MI with CHF EXAM: PORTABLE ABDOMEN - 1 VIEW COMPARISON:  Portable abdominal films of November 15, 2015 FINDINGS: There remain small amounts of gas within small bowel loops as well as and large bowel loops. No abnormally distended bowel loops are observed. There is gas in the stomach. A feeding tube is in place whose tip appears to lie at the duodenal -jejunal junction. IMPRESSION: Fairly stable appearance of the abdomen with moderate amounts of gas within small and large bowel loops consistent with ileus. No free extraluminal gas collections are observed. Electronically Signed   By: David  [place] M.D.   On: 11/16/2015 07:20    STUDIES:  Port CXR 3/28:  Previously reviewed by me. R PICC in good psition. ETT >5cm above carina. New L [place] CVL. L lung opacity unchanged. Low lung volumes. L chest tube in place. Port CXR 4/8:  Previously reviewed by me. Left basilar opacity unchanged. Trach & lines in place & unchanged.  Port CXR 4/9:  Personally reviewed by me. Left basilar opacity persists. No changes in tube or line positions.  KUB 4/9:  Personally reviewed by me. Mildly distended bowel filled with gas. No evidence of air-fluid levels or obstruction.  MICROBIOLOGY: Tracheal Asp Ctx 3/27:  Oral Flora Urine Ctx 3/24:  Negative MRSA PCR 3/20:  Negative MRSA PCR 3/14:  Negative  ANTIBIOTICS: Zosyn 3/29 - 4/1 Vancomcyin 3/21 - 3/22; 3/29 - 4/7 Cefuroxime 3/21 - 3/23 (periop prophylaxis) Elita Quick 3/27 - 3/29 Levaquin 4/1 - 4/7 Flagyl 4/9 >>  SIGNIFICANT EVENTS: 3/14 - Admit 3/14 - LHC 3/21 - CABG 3/27 - IABP replaced >> 4/1 3/28 - Chest Tubes removed. RHC & LHC.  LINES/TUBES: Trach 6.0 (JY) 4/5 >>  L [place] CVL 3/28>>> R PICC 3/23>>> R NJT>>> Foley>>> IABP 3/27 - 4/1 OETT 7.5 Extubated 3/22; 3.24 - 4/5  ASSESSMENT / PLAN:  PULMONARY A: Acute Hypoxic Respiratory Failure - Improving. Chronic Hypercarbic Respiratory Failure - OSA/OHS,  COPD. Chronic hypoxemic resp failure - cardiogenic pulmonary edema  P:   Continue trach collar trial. 4 hrs on and 4 hrs off. Rest on Full Vent Support On Xopenex q6hr.  CARDIOVASCULAR A:  S/P CABG & CEA Left Ventricular Heart Failure post MI Cardiogenic Shock Periop Complete Heart Block & Post-op V Tach H/O HTN  P:  Management per CVTS & Cardiology. ASA VT Lipitor VT Amiodarone gtt. Changed to PO Heparin gtt Revatio VT tid Lasix IV daily  RENAL A:   Acute Renal Failure - Resolving. Hypoklalemia - Resolved. Hypernatremia - Worsening P:   Lasix IV daily Trending UOP with Foley Electrolytes & Renal Function daily Continue free water. Started on D5W at 150/hr  for hypernatremia  GASTROINTESTINAL A:   Transaminitis - Likely shock liver/hepatic congestion. Ileus  P:   Tolerating tube feeds  HEMATOLOGIC A:   Anemia - Hgb stable. No signs of active bleeding. Leukocytosis - Mild.  P:  Transfusion PRBC as needed Heparin gtt Daily CBC  INFECTIOUS A:   Possible HCAP - Ctx w/ Oral Flora.  P:   Completed 10 day course of antibiotics  ENDOCRINE CBG (last 3)  A:   Hyperglycemia - BG controlled.  P:   Continue Levemir bid Accu-checks q4hr SSI per algorithm  NEUROLOGIC A:   Sedation on Ventilator Post-op Pain Control   P:   RASS goal: 0 Versed IV prn Fentanyl IV prn Seroquel qhs  FAMILY UPDATES: pt and significant other updated at bedside 4/10  TODAY'S SUMMARY:  55 year old male s/p CABG & Right CEA with complicated post-op course. Patient's renal function slowly improving. Tolerating tracheostomy collar trials. Ileus  Improving Tolerating tube feeds.  Critical care time- 35 mins.  Chilton Greathouse MD  Pulmonary and Critical Care Pager [phone] If no answer or after 3pm call: [phone] 11/16/2015, 11:09 AM

## 2015-11-16 NOTE — Progress Notes (Signed)
RT note- Placed to ATC for wean

## 2015-11-17 ENCOUNTER — Inpatient Hospital Stay (HOSPITAL_COMMUNITY): Payer: BLUE CROSS/BLUE SHIELD

## 2015-11-17 LAB — GLUCOSE, CAPILLARY
GLUCOSE-CAPILLARY: 94 mg/dL (ref 65–99)
GLUCOSE-CAPILLARY: 119 mg/dL — AB (ref 65–99)
GLUCOSE-CAPILLARY: 79 mg/dL (ref 65–99)
GLUCOSE-CAPILLARY: 95 mg/dL (ref 65–99)
Glucose-Capillary: 114 mg/dL — ABNORMAL HIGH (ref 65–99)
Glucose-Capillary: 95 mg/dL (ref 65–99)

## 2015-11-17 LAB — HEPATIC FUNCTION PANEL
ALT: 51 U/L (ref 17–63)
AST: 50 U/L — ABNORMAL HIGH (ref 15–41)
Albumin: 2.6 g/dL — ABNORMAL LOW (ref 3.5–5.0)
Alkaline Phosphatase: 66 U/L (ref 38–126)
Bilirubin, Direct: 0.4 mg/dL (ref 0.1–0.5)
Indirect Bilirubin: 1 mg/dL — ABNORMAL HIGH (ref 0.3–0.9)
Total Bilirubin: 1.4 mg/dL — ABNORMAL HIGH (ref 0.3–1.2)
Total Protein: 6.5 g/dL (ref 6.5–8.1)

## 2015-11-17 LAB — BASIC METABOLIC PANEL
Anion gap: 12 (ref 5–15)
BUN: 95 mg/dL — ABNORMAL HIGH (ref 6–20)
CO2: 27 mmol/L (ref 22–32)
Calcium: 9 mg/dL (ref 8.9–10.3)
Chloride: 116 mmol/L — ABNORMAL HIGH (ref 101–111)
Creatinine, Ser: 2.34 mg/dL — ABNORMAL HIGH (ref 0.61–1.24)
GFR calc Af Amer: 35 mL/min — ABNORMAL LOW (ref >60)
GFR calc non Af Amer: 30 mL/min — ABNORMAL LOW (ref >60)
Glucose, Bld: 107 mg/dL — ABNORMAL HIGH (ref 65–99)
Potassium: 4.3 mmol/L (ref 3.5–5.1)
Sodium: 155 mmol/L — ABNORMAL HIGH (ref 135–145)

## 2015-11-17 LAB — CBC
HCT: 35.4 % — ABNORMAL LOW (ref 39.0–52.0)
Hemoglobin: 10.3 g/dL — ABNORMAL LOW (ref 13.0–17.0)
MCH: 27.5 pg (ref 26.0–34.0)
MCHC: 29.1 g/dL — ABNORMAL LOW (ref 30.0–36.0)
MCV: 94.4 fL (ref 78.0–100.0)
Platelets: 236 10*3/uL (ref 150–400)
RBC: 3.75 MIL/uL — ABNORMAL LOW (ref 4.22–5.81)
RDW: 17.9 % — ABNORMAL HIGH (ref 11.5–15.5)
WBC: 9.7 10*3/uL (ref 4.0–10.5)

## 2015-11-17 LAB — MAGNESIUM: Magnesium: 2.6 mg/dL — ABNORMAL HIGH (ref 1.7–2.4)

## 2015-11-17 LAB — AMYLASE: Amylase: 72 U/L (ref 28–100)

## 2015-11-17 LAB — HEPARIN LEVEL (UNFRACTIONATED): Heparin Unfractionated: 0.21 IU/mL — ABNORMAL LOW (ref 0.30–0.70)

## 2015-11-17 MED ORDER — ALTEPLASE 2 MG IJ SOLR
2.0000 mg | Freq: Once | INTRAMUSCULAR | Status: AC
  Administered 2015-11-17: 2 mg
  Filled 2015-11-17: qty 2

## 2015-11-17 MED ORDER — FREE WATER
400.0000 mL | Status: DC
  Administered 2015-11-17 – 2015-11-18 (×6): 400 mL

## 2015-11-17 MED ORDER — DEXTROSE 5 % IV SOLN
INTRAVENOUS | Status: DC
  Administered 2015-11-17: 150 mL via INTRAVENOUS
  Administered 2015-11-21: 50 mL via INTRAVENOUS
  Administered 2015-11-23: 10 mL via INTRAVENOUS

## 2015-11-17 NOTE — Progress Notes (Signed)
Verbal order from Dr. Zenaida Niece Trigt--ok to take pt outside.  Per RT and CCM pt with too many secretions to be able to go outside.

## 2015-11-17 NOTE — Progress Notes (Signed)
Subjective:  Asked by Dr. Haroldine Laws to revisit patient again for continued uremia and hypernatremia- although is improving but not really quickly  Objective Vital signs in last 24 hours: Filed Vitals:   11/17/15 0720 11/17/15 0852 11/17/15 0934 11/17/15 1137  BP:  131/88 133/91   Pulse:  82 85 82  Temp: 98.7 F (37.1 C)   98.6 F (37 C)  TempSrc: Oral   Oral  Resp:  14 21 16   Height:      Weight:      SpO2:  100% 100% 99%   Weight change: 1.8 kg (3 lb 15.5 oz)  Intake/Output Summary (Last 24 hours) at 11/17/15 1218 Last data filed at 11/17/15 0800  Gross per 24 hour  Intake 3296.4 ml  Output   1645 ml  Net 1651.4 ml    Assessment/ Plan: Pt is a 55 y.o. yo male who with ischemic cardiomyopathy was admitted on 10/20/2015 with undergoing CABG and CEA- had AKI which has improved but remains stalled and also with uremia and hypernatremia Assessment/Plan: 1. AKI- improved but not quite back to baseline- is possible will have residual CKD based on this insult.  BUN could be staying a little high based on high protein tube feeds which obviously we want to continue- is not clinically uremic 2. Hypernatremia- water deficient.  Currently on free water 400 q 4 hours= 2.6 liters and also IV d5 another 3600 per 24 hours- it appears that the IV dose has just been increased- continue to watch trend- has insensible losses with trach/gi as well as good UOP although not in the DI range.  Will  Hold lasix for a couple of days as well  3. Anemia- present and stable 5. HTN/volume- seems euvolemic but free water deficient- hold lasix just temporarily   Shawntel Farnworth A    Labs: Basic Metabolic Panel:  Recent Labs Lab 11/11/15 0320  11/12/15 0400  11/13/15 0430  11/16/15 0400 11/16/15 1255 11/17/15 0416  NA 150*  < > 151*  < > 152*  < > 158* 154* 155*  K 3.1*  < > 3.0*  < > 3.5  < > 4.0 3.8 4.3  CL 95*  < > 100*  < > 105  < > 116* 113* 116*  CO2 37*  --  35*  --  34*  < > 30 28 27    GLUCOSE 116*  < > 126*  < > 129*  < > 119* 129* 107*  BUN 164*  < > 146*  < > 115*  < > 104* 102* 95*  CREATININE 3.33*  < > 2.80*  < > 2.22*  < > 2.42* 2.41* 2.34*  CALCIUM 9.6  --  9.5  --  9.3  < > 9.1 8.9 9.0  PHOS 5.9*  5.7*  --  4.3  --  5.8*  --   --   --   --   < > = values in this interval not displayed. Liver Function Tests:  Recent Labs Lab 11/12/15 0400 11/13/15 0430 11/17/15 0416  AST  --   --  50*  ALT  --   --  51  ALKPHOS  --   --  66  BILITOT  --   --  1.4*  PROT  --   --  6.5  ALBUMIN 2.5* 2.6* 2.6*    Recent Labs Lab 11/17/15 0416  AMYLASE 72   No results for input(s): AMMONIA in the last 168 hours. CBC:  Recent Labs Lab  11/12/15 0400  11/13/15 0430  11/15/15 0420 11/16/15 0400 11/17/15 0416  WBC 12.1*  --  10.4  --  13.2* 12.2* 9.7  HGB 11.0*  < > 10.9*  < > 10.9* 10.4* 10.3*  HCT 36.2*  < > 36.6*  < > 36.1* 35.8* 35.4*  MCV 91.6  --  94.6  --  95.3 95.0 94.4  PLT 348  --  337  --  299 265 236  < > = values in this interval not displayed. Cardiac Enzymes: No results for input(s): CKTOTAL, CKMB, CKMBINDEX, TROPONINI in the last 168 hours. CBG:  Recent Labs Lab 11/16/15 2003 11/16/15 2330 11/17/15 0423 11/17/15 0712 11/17/15 1135  GLUCAP 85 114* 95 94 119*    Iron Studies: No results for input(s): IRON, TIBC, TRANSFERRIN, FERRITIN in the last 72 hours. Studies/Results: Dg Chest Port 1 View  11/17/2015  CLINICAL DATA:  Shortness of breath. EXAM: PORTABLE CHEST 1 VIEW COMPARISON:  11/16/2015. FINDINGS: Tracheostomy tube, feeding tube, right PICC line, left subclavian central line in stable position. Prior CABG. Cardiomegaly. Left lower lobe infiltrate a small left pleural effusion. Mild right base infiltrate. No pneumothorax. IMPRESSION: 1.  Lines and tubes in stable position. 2. Persistent left lower lobe infiltrate and left pleural effusion. No interim change. Mild right lower lobe infiltrate. No change from prior exam . 3.  Stable  cardiomegaly. Electronically Signed   By: Marcello Moores  Register   On: 11/17/2015 07:19   Dg Chest Port 1 View  11/16/2015  CLINICAL DATA:  Follow-up respiratory failure EXAM: PORTABLE CHEST 1 VIEW COMPARISON:  11/15/2014 FINDINGS: Prior CABG. Tracheostomy tube, right PICC line and left subclavian central line remain in place, unchanged. There is cardiomegaly with vascular congestion. Continued left lower lobe atelectasis or pneumonia and suspect small left pleural effusion. No significant change since prior study. No confluent opacity on the right. IMPRESSION: Stable left lower lobe atelectasis or pneumonia with small left effusion. Stable cardiomegaly with vascular congestion. Electronically Signed   By: Rolm Baptise M.D.   On: 11/16/2015 07:27   Dg Abd Portable 1v  11/16/2015  CLINICAL DATA:  Ileus, acute MI with CHF EXAM: PORTABLE ABDOMEN - 1 VIEW COMPARISON:  Portable abdominal films of November 15, 2015 FINDINGS: There remain small amounts of gas within small bowel loops as well as and large bowel loops. No abnormally distended bowel loops are observed. There is gas in the stomach. A feeding tube is in place whose tip appears to lie at the duodenal -jejunal junction. IMPRESSION: Fairly stable appearance of the abdomen with moderate amounts of gas within small and large bowel loops consistent with ileus. No free extraluminal gas collections are observed. Electronically Signed   By: David  Martinique M.D.   On: 11/16/2015 07:20   Medications: Infusions: . sodium chloride Stopped (11/15/15 0600)  . amiodarone Stopped (11/16/15 1223)  . dextrose    . feeding supplement (VITAL HIGH PROTEIN) 1,000 mL (11/16/15 0800)  . heparin 1,000 Units/hr (11/17/15 0000)    Scheduled Medications: . amiodarone  150 mg Intravenous Once  . amiodarone  200 mg Oral BID  . antiseptic oral rinse  7 mL Mouth Rinse QID  . aspirin  81 mg Oral Daily  . atorvastatin  80 mg Oral q1800  . chlorhexidine gluconate (SAGE KIT)  15 mL  Mouth Rinse BID  . feeding supplement (PRO-STAT SUGAR FREE 64)  30 mL Per Tube TID  . free water  400 mL Per Tube Q4H  . furosemide  40 mg Intravenous Daily  . hydrALAZINE  25 mg Oral 3 times per day  . insulin aspart  0-24 Units Subcutaneous 6 times per day  . insulin detemir  12 Units Subcutaneous BID  . levalbuterol  1.25 mg Nebulization TID  . pantoprazole sodium  40 mg Per Tube Daily  . potassium chloride  40 mEq Oral 3 times per day  . QUEtiapine  50 mg Oral QHS  . sildenafil  40 mg Oral TID  . silver sulfADIAZINE   Topical BID  . sodium chloride flush  10-40 mL Intracatheter Q12H  . triamcinolone cream   Topical BID    have reviewed scheduled and prn medications.  Physical Exam: General: alert on vent- trying to talk Heart: RRR Lungs: mostly clear Abdomen: soft, non tender Extremities: some edema- third spacing due to low albumin     11/17/2015,12:19 PM  LOS: 28 days

## 2015-11-17 NOTE — Progress Notes (Signed)
Patient ID: Christopher [name], male   DOB: 10-29-1960, 55 y.o.   MRN: [id number] EVENING ROUNDS NOTE :     [address]       [place] [address]             [phone]                 15 Days Post-Op Procedure(s) (LRB): Right/Left Heart Cath and Coronary/Graft Angiography (N/A) Intra-Aortic Balloon Pump Insertion  Total Length of Stay:  LOS: 28 days  BP 116/80 mmHg  Pulse 76  Temp(Src) 98.1 F (36.7 C) (Oral)  Resp 22  Ht 5' 10.5" (1.791 m)  Wt 230 lb 13.2 oz (104.7 kg)  BMI 32.64 kg/m2  SpO2 100%  .Intake/Output      04/11 0701 - 04/12 0700   P.O.    I.V. (mL/kg) 860 (8.2)   NG/GT 220   Total Intake(mL/kg) 1080 (10.3)   Urine (mL/kg/hr) 910 (0.7)   Stool 201 (0.2)   Total Output 1111   Net -31         . sodium chloride Stopped (11/15/15 0600)  . amiodarone Stopped (11/16/15 1223)  . dextrose 150 mL (11/17/15 1309)  . feeding supplement (VITAL HIGH PROTEIN) 1,000 mL (11/17/15 1837)  . heparin 1,000 Units/hr (11/17/15 0000)     Lab Results  Component Value Date   WBC 9.7 11/17/2015   HGB 10.3* 11/17/2015   HCT 35.4* 11/17/2015   PLT 236 11/17/2015   GLUCOSE 107* 11/17/2015   CHOL 150 10/21/2015   TRIG 171* 10/21/2015   HDL 18* 10/21/2015   LDLCALC 98 10/21/2015   ALT 51 11/17/2015   AST 50* 11/17/2015   NA 155* 11/17/2015   K 4.3 11/17/2015   CL 116* 11/17/2015   CREATININE 2.34* 11/17/2015   BUN 95* 11/17/2015   CO2 27 11/17/2015   TSH 7.432* 10/30/2015   INR 1.44 11/11/2015   HGBA1C 6.2* 10/23/2015   Trach collar all day today  Delight Ovens MD  [facility]eeper [phone] Office [phone] 11/17/2015 7:09 PM

## 2015-11-17 NOTE — Progress Notes (Addendum)
PULMONARY / CRITICAL CARE MEDICINE   Name: Christopher [name] MRN: [id number] DOB: [birth year]    ADMISSION DATE:  10/20/2015 CONSULTATION DATE:  11/03/2015  REFERRING MD: Dr. Donata Clay / CVTS  CHIEF COMPLAINT:  Acute Hypoxic Respiratory Failure  HISTORY OF PRESENT ILLNESS:   [name] w/ ischemic cardiomyopathy following MI. Patient underwent CABG & CEA. Post-operative course complicated by arrhythmia and biventricular failure, HCAP, heart block. Improving  SUBJECTIVE: No issues overnight.  REVIEW OF SYSTEMS:  Feels well. Denies any nausea, vomiting No chest pain, palpitations No dyspnea, cough, wheezing.   VITAL SIGNS: BP 131/88 mmHg  Pulse 82  Temp(Src) 98.7 F (37.1 C) (Oral)  Resp 14  Ht 5' 10.5" (1.791 m)  Wt 230 lb 13.2 oz (104.7 kg)  BMI 32.64 kg/m2  SpO2 100%  HEMODYNAMICS: CVP:  [10 mmHg-72 mmHg] 12 mmHg  VENTILATOR SETTINGS: Vent Mode:  [-] PRVC FiO2 (%):  [40 %] 40 % Set Rate:  [12 bmp] 12 bmp Vt Set:  [590 mL] 590 mL PEEP:  [5 cmH20] 5 cmH20 Pressure Support:  [8 cmH20] 8 cmH20 Plateau Pressure:  [14 cmH20-20 cmH20] 14 cmH20  INTAKE / OUTPUT: I/O last 3 completed shifts: In: 6372.8 [P.O.:20; I.V.:3087.8; NG/GT:3265] Out: 3870 [Urine:3445; Stool:425]  PHYSICAL EXAMINATION: General:  Comfortable. Sitting up in chair Neuro:  Movers all 4 extremities. Grossly nonfocal. HEENT: Trach in place. No JVD, thyomegaly Cardiovascular:  Regular rate. No MRG Lungs:  Clear, no wheezing, crackles Abdomen:  Soft. Protuberant. Normal BS. Nontender. Integument:  No rash  LABS:  BMET  Recent Labs Lab 11/16/15 0400 11/16/15 1255 11/17/15 0416  NA 158* 154* 155*  K 4.0 3.8 4.3  CL 116* 113* 116*  CO2 BUN 104* 102* 95*  CREATININE 2.42* 2.41* 2.34*  GLUCOSE 119* 129* 107*   Electrolytes  Recent Labs Lab 11/11/15 0320 11/12/15 0400 11/13/15 0430  11/15/15 0420 11/16/15 0400 11/16/15 1255 11/17/15 0416  CALCIUM 9.6 9.5 9.3  < > 9.0  9.1 8.9 9.0  MG 2.8* 2.5* 2.6*  < > 2.5* 2.6*  --  2.6*  PHOS 5.9*  5.7* 4.3 5.8*  --   --   --   --   --   < > = values in this interval not displayed. CBC  Recent Labs Lab 11/15/15 0420 11/16/15 0400 11/17/15 0416  WBC 13.2* 12.2* 9.7  HGB 10.9* 10.4* 10.3*  HCT 36.1* 35.8* 35.4*  PLT 299 265 236   Coag's  Recent Labs Lab 11/11/15 0940  APTT 29  INR 1.44    Sepsis Markers No results for input(s): LATICACIDVEN, PROCALCITON, O2SATVEN in the last 168 hours. ABG  Recent Labs Lab 11/12/15 0451 11/12/15 0457 11/13/15 0526  PHART 7.615* 7.611* 7.419  PCO2ART 37.9 37.9 55.2*  PO2ART 74.0* 74.0* 68.0*    Liver Enzymes  Recent Labs Lab 11/10/15 1040  11/12/15 0400 11/13/15 0430 11/17/15 0416  AST 50*  --   --   --  50*  ALT 56  --   --   --  51  ALKPHOS 79  --   --   --  66  BILITOT 1.0  --   --   --  1.4*  ALBUMIN 2.8*  < > 2.5* 2.6* 2.6*  < > = values in this interval not displayed.  Cardiac Enzymes No results for input(s): TROPONINI, PROBNP in the last 168 hours.  Glucose  Recent Labs Lab 11/16/15 1124 11/16/15 1513 11/16/15 2003 11/16/15 2330  11/17/15 0423 11/17/15 0712  GLUCAP 105* 108* 85 114* 95 94   Imaging Dg Chest Port 1 View  11/17/2015  CLINICAL DATA:  Shortness of breath. EXAM: PORTABLE CHEST 1 VIEW COMPARISON:  11/16/2015. FINDINGS: Tracheostomy tube, feeding tube, right PICC line, left subclavian central line in stable position. Prior CABG. Cardiomegaly. Left lower lobe infiltrate a small left pleural effusion. Mild right base infiltrate. No pneumothorax. IMPRESSION: 1.  Lines and tubes in stable position. 2. Persistent left lower lobe infiltrate and left pleural effusion. No interim change. Mild right lower lobe infiltrate. No change from prior exam . 3.  Stable cardiomegaly. Electronically Signed   By: Maisie Fus  Register   On: 11/17/2015 07:19    STUDIES:  CXR 4/11> images reviewed. No change from prior. Has LLL opacity, fluid.    MICROBIOLOGY: Tracheal Asp Ctx 3/27:  Oral Flora Urine Ctx 3/24:  Negative MRSA PCR 3/20:  Negative MRSA PCR 3/14:  Negative C diff 4/10 > Negative  ANTIBIOTICS: Zosyn 3/29 - 4/1 Vancomcyin 3/21 - 3/22; 3/29 - 4/7 Cefuroxime 3/21 - 3/23 (periop prophylaxis) Elita Quick 3/27 - 3/29 Levaquin 4/1 - 4/7 Flagyl 4/9 >> 4/11  SIGNIFICANT EVENTS: 3/14 - Admit 3/14 - LHC 3/21 - CABG 3/27 - IABP replaced >> 4/1 3/28 - Chest Tubes removed. RHC & LHC.  LINES/TUBES: Trach 6.0 (JY) 4/5 >>  L Cantu Addition CVL 3/28>>> R PICC 3/23>>> R NJT>>> Foley>>> IABP 3/27 - 4/1 OETT 7.5 Extubated 3/22; 3.24 - 4/5  ASSESSMENT / PLAN:  PULMONARY A: Acute Hypoxic Respiratory Failure - Improving. Chronic Hypercarbic Respiratory Failure - OSA/OHS, COPD. Chronic hypoxemic resp failure - cardiogenic pulmonary edema  P:   Continue trach collar trial. 4 hrs on and 4 hrs off. Will slowly increase time off vent as tolerated. On Xopenex q6hr.  CARDIOVASCULAR A:  S/P CABG & CEA Left Ventricular Heart Failure post MI Cardiogenic Shock Periop Complete Heart Block & Post-op V Tach H/O HTN  P:  Management per CVTS & Cardiology. ASA VT Lipitor VT Amiodarone gtt. Changed to PO Heparin gtt Revatio VT tid Lasix IV daily  RENAL A:   Acute Renal Failure - Resolving. Hypoklalemia - Resolved. Hypernatremia - Worsening P:   Lasix IV daily Trending UOP with Foley Electrolytes & Renal Function daily Free water. D5W at 150/hr for hypernatremia  GASTROINTESTINAL A:   Transaminitis - Likely shock liver/hepatic congestion. Ileus  P:   Tolerating tube feeds. Ileus is improving  HEMATOLOGIC A:   Anemia - Hgb stable. No signs of active bleeding. Leukocytosis - Mild.  P:  Transfusion PRBC as needed Heparin gtt Daily CBC  INFECTIOUS A:   Possible HCAP - Ctx w/ Oral Flora.  P:   Completed 10 day course of antibiotics. D/C flagyl as C.diff is negative.   ENDOCRINE CBG (last 3)  A:    Hyperglycemia - BG controlled.  P:   Continue Levemir bid Accu-checks q4hr SSI per algorithm  NEUROLOGIC A:   Sedation on Ventilator Post-op Pain Control   P:   RASS goal: 0 Versed IV prn Fentanyl IV prn Seroquel qhs  FAMILY UPDATES: pt and significant other updated at bedside 4/11  TODAY'S SUMMARY:  55 year old male s/p CABG & Right CEA with complicated post-op course. Patient's renal function slowly improving. Tolerating tracheostomy collar trials. Ileus  Improving Tolerating tube feeds.  Critical care time- 35 mins.  Chilton Greathouse MD  Pulmonary and Critical Care Pager [phone] If no answer or after 3pm call: [phone] 11/17/2015,  9:53 AM

## 2015-11-17 NOTE — Progress Notes (Signed)
Pt remains of ATC and is doing well.  Vent is on standby.  RT will continue to monitor pt and will place the pt on vent if/when needed.

## 2015-11-17 NOTE — Progress Notes (Signed)
Patient ID: Christopher [name], male   DOB: 1961-04-27, 55 y.o.   MRN: [id number]   ADVANCED HF ROUNDING NOTE  Subjective:  Still oozing from trach. Got IV lasix today as CXR wet and CVP still up. U/o picked up   Remains in AFL on IV amio.   Serum sodium still going up.  On Free H2O 400 q4.   Scheduled Meds: . amiodarone  150 mg Intravenous Once  . amiodarone  200 mg Oral BID  . antiseptic oral rinse  7 mL Mouth Rinse QID  . aspirin  81 mg Oral Daily  . atorvastatin  80 mg Oral q1800  . chlorhexidine gluconate (SAGE KIT)  15 mL Mouth Rinse BID  . feeding supplement (PRO-STAT SUGAR FREE 64)  30 mL Per Tube TID  . free water  400 mL Per Tube Q4H  . furosemide  40 mg Intravenous Daily  . hydrALAZINE  25 mg Oral 3 times per day  . insulin aspart  0-24 Units Subcutaneous 6 times per day  . insulin detemir  12 Units Subcutaneous BID  . levalbuterol  1.25 mg Nebulization TID  . pantoprazole sodium  40 mg Per Tube Daily  . potassium chloride  40 mEq Oral 3 times per day  . QUEtiapine  50 mg Oral QHS  . sildenafil  40 mg Oral TID  . silver sulfADIAZINE   Topical BID  . sodium chloride flush  10-40 mL Intracatheter Q12H  . triamcinolone cream   Topical BID   Continuous Infusions: . sodium chloride Stopped (11/15/15 0600)  . amiodarone Stopped (11/16/15 1223)  . dextrose    . feeding supplement (VITAL HIGH PROTEIN) 1,000 mL (11/16/15 0800)  . heparin 1,000 Units/hr (11/17/15 0000)   PRN Meds:.diphenhydrAMINE, fentaNYL (SUBLIMAZE) injection, levalbuterol, midazolam, ondansetron (ZOFRAN) IV, sodium chloride flush, traMADol    Filed Vitals:   11/17/15 0720 11/17/15 0852 11/17/15 0934 11/17/15 1137  BP:  131/88 133/91   Pulse:  82 85 82  Temp: 98.7 F (37.1 C)   98.6 F (37 C)  TempSrc: Oral   Oral  Resp:  14 21 16   Height:      Weight:      SpO2:  100% 100% 99%    Intake/Output Summary (Last 24 hours) at 11/17/15 1233 Last data filed at 11/17/15 0800  Gross per 24 hour    Intake 3296.4 ml  Output   1645 ml  Net 1651.4 ml    LABS: Basic Metabolic Panel:  Recent Labs  11/16/15 0400 11/16/15 1255 11/17/15 0416  NA 158* 154* 155*  K 4.0 3.8 4.3  CL 116* 113* 116*  CO2 30 28 27   GLUCOSE 119* 129* 107*  BUN 104* 102* 95*  CREATININE 2.42* 2.41* 2.34*  CALCIUM 9.1 8.9 9.0  MG 2.6*  --  2.6*   Liver Function Tests:  Recent Labs  11/17/15 0416  AST 50*  ALT 51  ALKPHOS 66  BILITOT 1.4*  PROT 6.5  ALBUMIN 2.6*    Recent Labs  11/17/15 0416  AMYLASE 72   CBC:  Recent Labs  11/16/15 0400 11/17/15 0416  WBC 12.2* 9.7  HGB 10.4* 10.3*  HCT 35.8* 35.4*  MCV 95.0 94.4  PLT 265 236   Cardiac Enzymes: No results for input(s): CKTOTAL, CKMB, CKMBINDEX, TROPONINI in the last 72 hours. BNP: Invalid input(s): POCBNP D-Dimer: No results for input(s): DDIMER in the last 72 hours. Hemoglobin A1C: No results for input(s): HGBA1C in the last 72 hours. Fasting  Lipid Panel: No results for input(s): CHOL, HDL, LDLCALC, TRIG, CHOLHDL, LDLDIRECT in the last 72 hours. Thyroid Function Tests: No results for input(s): TSH, T4TOTAL, T3FREE, THYROIDAB in the last 72 hours.  Invalid input(s): FREET3 Anemia Panel: No results for input(s): VITAMINB12, FOLATE, FERRITIN, TIBC, IRON, RETICCTPCT in the last 72 hours.  RADIOLOGY: Dg Chest 2 View  10/23/2015  CLINICAL DATA:  CHF.  Weakness. EXAM: CHEST  2 VIEW COMPARISON:  10/21/2015 FINDINGS: Multiple interstitial densities in the lower chest are suggestive for interstitial edema, right side greater than left. Heart size is upper limits of normal. Trachea is midline. Negative for a pneumothorax. Evidence for a small right pleural effusion. IMPRESSION: Interstitial densities in the lower chest, right side greater than left. Findings are suggestive for interstitial pulmonary edema. Difficult to exclude some airspace disease in the right lower lung. Evidence for small right pleural effusion. Electronically  Signed   By: Markus Daft M.D.   On: 10/23/2015 08:27   Dg Abd 1 View  11/03/2015  CLINICAL DATA:  Enteric tube advanced to post pyloric position. EXAM: ABDOMEN - 1 VIEW COMPARISON:  11/02/2015 abdominal radiographs. FINDINGS: Fluoroscopy time 7 minutes. Enteric tube courses through the stomach and duodenum and appears to terminate near the duodenal jejunal junction. Instilled contrast opacifies nondilated proximal jejunal small bowel loops. No contrast leak is demonstrated. Separate enteric tube terminates in the body of the stomach. Visualized lower sternotomy wires appear aligned and intact. IMPRESSION: Enteric tube appears to terminate near the duodenal jejunal junction. Separate enteric tube terminates in the body of the stomach. Electronically Signed   By: Ilona Sorrel M.D.   On: 11/03/2015 17:02   Dg Chest Port 1 View  11/17/2015  CLINICAL DATA:  Shortness of breath. EXAM: PORTABLE CHEST 1 VIEW COMPARISON:  11/16/2015. FINDINGS: Tracheostomy tube, feeding tube, right PICC line, left subclavian central line in stable position. Prior CABG. Cardiomegaly. Left lower lobe infiltrate a small left pleural effusion. Mild right base infiltrate. No pneumothorax. IMPRESSION: 1.  Lines and tubes in stable position. 2. Persistent left lower lobe infiltrate and left pleural effusion. No interim change. Mild right lower lobe infiltrate. No change from prior exam . 3.  Stable cardiomegaly. Electronically Signed   By: Marcello Moores  Register   On: 11/17/2015 07:19   Dg Chest Port 1 View  11/16/2015  CLINICAL DATA:  Follow-up respiratory failure EXAM: PORTABLE CHEST 1 VIEW COMPARISON:  11/15/2014 FINDINGS: Prior CABG. Tracheostomy tube, right PICC line and left subclavian central line remain in place, unchanged. There is cardiomegaly with vascular congestion. Continued left lower lobe atelectasis or pneumonia and suspect small left pleural effusion. No significant change since prior study. No confluent opacity on the right.  IMPRESSION: Stable left lower lobe atelectasis or pneumonia with small left effusion. Stable cardiomegaly with vascular congestion. Electronically Signed   By: Rolm Baptise M.D.   On: 11/16/2015 07:27   Dg Chest Port 1 View  11/15/2015  CLINICAL DATA:  Chronic ventilator dependent respiratory failure. CABG and right carotid endarterectomy 10/27/2015. EXAM: PORTABLE CHEST 1 VIEW COMPARISON:  11/14/2015 and earlier. FINDINGS: Tracheostomy tube tip in satisfactory position below the thoracic inlet. Left subclavian central venous catheter tip projects over the mid SVC, unchanged. Right arm PICC tip projects over lower SVC at or near the cavoatrial junction, unchanged. Feeding tube courses below the diaphragm into the stomach. Sternotomy for CABG. Cardiac silhouette moderately enlarged. Mild pulmonary venous hypertension without overt edema, unchanged. Dense consolidation in the left lower lobe with  air bronchograms, unchanged. Moderate-sized left pleural effusion, unchanged. No new pulmonary parenchymal abnormalities. IMPRESSION: 1.  Support apparatus satisfactory. 2. Stable dense left lower lobe atelectasis and/or pneumonia and left pleural effusion. 3. Stable cardiomegaly and pulmonary venous hypertension without overt edema. 4. No new abnormalities. Electronically Signed   By: Evangeline Dakin M.D.   On: 11/15/2015 07:22   Dg Chest Port 1 View  11/14/2015  CLINICAL DATA:  55 year old male with a history of shortness of breath. Patient admission 10/20/2015. Right carotid endarterectomy 10/27/2015, with same-day coronary artery bypass grafting 10/27/2015. Twelve days postop now with history of intra-aortic balloon pump. EXAM: PORTABLE CHEST 1 VIEW COMPARISON:  11/13/2015, 11/12/2015, 11/11/2015 FINDINGS: Cardiomediastinal silhouette likely unchanged, with the left heart border partially obscured by overlying lung and pleural disease. Low lung volumes persist with bibasilar opacities and obscuration of the  retrocardiac region. Obscuration of left hemidiaphragm. Unchanged tracheostomy tube, enteric feeding tube terminating out of the field of view, right upper extremity PICC and left subclavian central line. IMPRESSION: Similar appearance of the chest x-ray, with persisting bibasilar airspace opacities and left pleural effusion. Unchanged support apparatus as above. Signed, Dulcy Fanny. Earleen Newport, DO [facility] Electronically Signed   By: Corrie Mckusick D.O.   On: 11/14/2015 08:48   Dg Chest Port 1 View  11/13/2015  CLINICAL DATA:  Shortness of breath. EXAM: PORTABLE CHEST 1 VIEW COMPARISON:  November 12, 2015. FINDINGS: Stable cardiomegaly. Status post coronary bypass graft. Tracheostomy and feeding tube are unchanged. Bilateral subclavian catheters are unchanged. No pneumothorax is noted. Stable bibasilar opacities are noted with left greater than right. Stable left pleural effusion is noted. Bony thorax is unremarkable. IMPRESSION: Stable support apparatus. Stable bibasilar opacities. Stable left pleural effusion. Electronically Signed   By: Marijo Conception, M.D.   On: 11/13/2015 07:37   Dg Chest Port 1 View  11/12/2015  CLINICAL DATA:  Respiratory failure. EXAM: PORTABLE CHEST 1 VIEW COMPARISON:  11/11/2015. FINDINGS: Tracheostomy tube, feeding tube, left IJ line, right PICC line in stable position . Prior CABG. Cardiomegaly with diffuse bilateral pulmonary infiltrates consistent pulmonary edema. Progressed from prior exam. Small bilateral pleural effusions. No pneumothorax. IMPRESSION: 1. Lines and tubes in stable position. 2. Prior CABG. Cardiomegaly with progressive bilateral pulmonary infiltrates consistent with pulmonary edema. Small bilateral pleural effusions are noted. Electronically Signed   By: Marcello Moores  Register   On: 11/12/2015 07:46   Dg Chest Port 1 View  11/11/2015  CLINICAL DATA:  Tracheostomy placement EXAM: PORTABLE CHEST 1 VIEW COMPARISON:   11/11/2015 FINDINGS: Tracheostomy tube has been placed with the tip projecting over the mid trachea. No pneumothorax. Right PICC line remains in place, unchanged as does left central line. Cardiomegaly with bilateral perihilar and lower lobe opacities, likely edema. Suspect layering effusions. IMPRESSION: Interval placement tracheostomy tube without pneumothorax. Suspected CHF. Bilateral effusions. Findings similar to prior study. Electronically Signed   By: Rolm Baptise M.D.   On: 11/11/2015 14:50   Dg Chest Port 1 View  11/11/2015  CLINICAL DATA:  Respiratory failure. EXAM: PORTABLE CHEST 1 VIEW COMPARISON:  11/10/2015. FINDINGS: Endotracheal tube, feeding tube, bilateral subclavian lines in stable position. Prior CABG. Cardiomegaly with diffuse bilateral pulmonary infiltrates consistent with pulmonary edema. Worsening from prior exam. Small left pleural effusion. No pneumothorax. IMPRESSION: 1.  Lines and tubes in stable position. 2. Prior CABG. Cardiomegaly with worsening bilateral pulmonary edema. Small left pleural effusion. Electronically Signed   By: Marcello Moores  Register   On: 11/11/2015  07:17   Dg Chest Port 1 View  11/10/2015  CLINICAL DATA:  Shortness of breath . EXAM: PORTABLE CHEST 1 VIEW COMPARISON:  11/09/2015. FINDINGS: Endotracheal tube, NG tube, right PICC line, left subclavian line in stable position. Prior CABG. Cardiomegaly. Left lower lobe atelectasis and/or infiltrate with small left pleural effusion. Mild right base atelectasis and or infiltrate. No pneumothorax . IMPRESSION: 1.  Lines and tubes in stable position. 2. Left lower lobe atelectasis and/or infiltrate with small left pleural effusion. Mild right base atelectasis and/or infiltrate. Findings stable from prior exam. 3.  Prior CABG.  Stable cardiomegaly. Electronically Signed   By: Marcello Moores  Register   On: 11/10/2015 07:51   Dg Chest Port 1 View  11/09/2015  CLINICAL DATA:  Atelectasis. EXAM: PORTABLE CHEST 1 VIEW COMPARISON:   11/08/2015 FINDINGS: Endotracheal tube in satisfactory position. Right arm PICC tip in the lower SVC unchanged. Left subclavian central venous catheter in the SVC. NG tube and feeding tube enters the stomach with the tips not visualized. Negative for pneumothorax. Left lower lobe airspace disease unchanged. Small left effusion unchanged. Mild pulmonary vascular congestion unchanged. IMPRESSION: Support lines remain in satisfactory position and unchanged Left lower lobe atelectasis/ infiltrate unchanged. Mild right lower lobe airspace consolidation stable. Mild vascular congestion unchanged. Electronically Signed   By: Franchot Gallo M.D.   On: 11/09/2015 07:36   Dg Chest Port 1 View  11/08/2015  CLINICAL DATA:  Respiratory failure, hypertension, coronary artery disease post MI and CABG EXAM: PORTABLE CHEST 1 VIEW COMPARISON:  Portable exam 1216 hours compared to 11/06/2015 FINDINGS: Tip of endotracheal tube projects 6.8 cm above carina. Nasogastric tube and feeding tube extends into stomach. LEFT subclavian central venous catheter with tip projecting over SVC. RIGHT arm PICC line tip projects over SVC. Enlargement of cardiac silhouette post CABG. Stable mediastinal contours. Increased LEFT lower lobe consolidation. Slightly increased infiltrate at RIGHT base as well. Minimal central peribronchial thickening. No pleural effusion or pneumothorax. IMPRESSION: Enlargement of cardiac silhouette post CABG. Stable line and tube positions. Increased LEFT lower lobe consolidation with minimally increased infiltrate at RIGHT base as well. Findings are suspicious for pneumonia involving the LEFT lower lobe though a component of coexisting pulmonary edema may be present. Electronically Signed   By: Lavonia Dana M.D.   On: 11/08/2015 12:47   Dg Chest Port 1 View  11/06/2015  CLINICAL DATA:  Acute CHF secondary to acute MI, complete heart block. EXAM: PORTABLE CHEST 1 VIEW COMPARISON:  Portable chest x-ray of November 05, 2015  FINDINGS: The lungs are adequately inflated. The pulmonary interstitial markings are less prominent on the right today. They have improved on the left as well and the hemidiaphragm is now visible. There is persistent interstitial edema on the left. The cardiac silhouette remains enlarged. The intraaortic balloon pump marker tip projects over the proximal portion of descending thoracic aorta and appears stable. The endotracheal tube tip projects 5.5 cm above the carina. The nasogastric and Doppler AH feeding tubes have their tips below the inferior margin of the image. The right-sided PICC line tip projects over the junction of the middle and distal thirds of the SVC. The left subclavian venous catheter tip projects over the midportion of the SVC. IMPRESSION: Decreasing pulmonary edema consistent with improvement in CHF. Persistent perihilar interstitial edema on the left. The support tubes and devices are in reasonable position. Electronically Signed   By: David  Martinique M.D.   On: 11/06/2015 07:32   Dg Chest [facility] 1 [address]  11/05/2015  CLINICAL DATA:  Respiratory failure. EXAM: PORTABLE CHEST 1 VIEW COMPARISON:  11/04/2015 . FINDINGS: Endotracheal tube, NG tube, right PICC line stable position. Prior CABG. Cardiomegaly. Persistent left mid and lower lung field infiltrate. Persistent mild atelectatic changes right upper and right lower lobes. No pneumothorax. IMPRESSION: 1.  Lines and tubes in stable position. 2. Persistent left mid and left lower lung infiltrate, no interim change. Persistent right upper lobe and right lower lobe subsegmental atelectasis. No interim change. 3. Prior CABG.  Stable cardiomegaly. Electronically Signed   By: Marcello Moores  Register   On: 11/05/2015 07:48   Dg Chest Port 1 View  11/04/2015  CLINICAL DATA:  Hypoxia EXAM: PORTABLE CHEST 1 VIEW COMPARISON:  November 04, 2015 FINDINGS: Endotracheal tube tip is 5.3 cm above the carina. Central catheter tip is in the superior vena cava near the  cavoatrial junction. Nasogastric tube tip and side port are below the diaphragm. There also is a feeding tube with the tip below the diaphragm. No pneumothorax. There has been removal of a left chest tube. There is airspace consolidation throughout the left mid and lower lung zones, stable. There is mild atelectasis in the right upper lobe, stable. No new opacity is evident. There is stable cardiomegaly. The pulmonary vascularity is normal. No adenopathy is evident. IMPRESSION: Tube and catheter positions as described without pneumothorax. Airspace consolidation throughout the left mid and lower lung zones, stable. Atelectasis right upper lobe, stable. No new opacity. Stable cardiomegaly. Electronically Signed   By: Lowella Grip III M.D.   On: 11/04/2015 14:52   Dg Chest Port 1 View  11/04/2015  CLINICAL DATA:  Status post CABG 10/28/2015. EXAM: PORTABLE CHEST 1 VIEW COMPARISON:  Single view of the chest 11/03/2015 and 11/02/2015. FINDINGS: Support tubes and lines including a left chest tube are unchanged since the most recent exam. The chest is better expanded today with decreased atelectasis. Airspace opacity in the left mid and lower lung zones persists. There is likely a left pleural effusion. Marked cardiomegaly is seen with only mild vascular congestion noted. IMPRESSION: Increased pulmonary expansion with decreased scattered atelectasis. Left mid and lower lung zone airspace disease could be due to atelectasis or pneumonia. Small left pleural effusion noted. Support tubes and lines projecting good position.  No pneumothorax. Electronically Signed   By: Inge Rise M.D.   On: 11/04/2015 07:30   Dg Chest Port 1 View  11/03/2015  CLINICAL DATA:  Central line placement. EXAM: PORTABLE CHEST 1 VIEW COMPARISON:  11/03/2015. FINDINGS: Left subclavian central line noted with tip at the cavoatrial junction. Interval removal of right chest tube. Endotracheal tube, NG tube, right PICC line, left IJ  sheath left chest tube in stable position. No pneumothorax. Prior CABG. Stable cardiomegaly. Low lung volumes. A mild component of congestive heart failure cannot be excluded. Similar findings noted on prior exam P IMPRESSION: 1. Interim placement of left subclavian central line, its tip is at the cavoatrial junction. Interim removal right chest tube . No pneumothorax. 2. Remaining lines and tubes including left chest tube in stable position. 3. Prior CABG. Stable cardiomegaly. Low lung volumes. A a mild component congestive heart failure cannot be excluded . Similar findings noted on prior exam. Electronically Signed   By: [place]   On: 11/03/2015 09:58   Dg Chest Port 1 View  11/03/2015  CLINICAL DATA:  Status post CABG on October 27, 2015 persistent left lower lobe atelectasis or pneumonia. No pneumothorax or significant pleural effusion. EXAM:  PORTABLE CHEST 1 VIEW COMPARISON:  Portable chest x-ray of November 02, 2015 at 5:48 a.m. FINDINGS: There has been interval placement of an intra aortic balloon pump whose marker lies over the junction of the aortic arch with the proximal descending thoracic aorta. The cardiac silhouette remains enlarged. The pulmonary vascularity remains mildly prominent. The retrocardiac region on the left remains dense. Bilateral chest tubes are in stable position. There is no pneumothorax nor significant pleural effusion. The endotracheal tube tip lies approximately 6.5 cm above the carina. The esophagogastric tube tip projects below the inferior margin of the image. The right-sided PICC line tip projects over the midportion of the SVC. A previously demonstrated pericardial drain is not clearly evident on today's study. IMPRESSION: Mild CHF. Persistent left lower lobe atelectasis or pneumonia. Interval placement of an intra aortic balloon pump which appears to be in appropriate position radiographically. The other support tubes and lines are in reasonable position.  Electronically Signed   By: David  Martinique M.D.   On: 11/03/2015 07:27   Dg Chest Port 1 View  11/02/2015  CLINICAL DATA:  CABG. EXAM: PORTABLE CHEST 1 VIEW COMPARISON:  11/01/2015. FINDINGS: Endotracheal tube, NG tube, right PICC line, bilateral chest tubes in stable position. Prior CABG. Stable cardiomegaly . Persistent mild interstitial prominence, congestive heart failure cannot be excluded. No interim change. Low lung volumes with basilar atelectasis . IMPRESSION: 1. Endotracheal tube, NG tube, right PICC line, bilateral chest tubes in stable position . No pneumothorax. 2. Prior CABG. Cardiomegaly with pulmonary interstitial prominence and small left pleural effusion consistent with mild congestive heart failure. No interim change from prior exam . Electronically Signed   By: Marcello Moores  Register   On: 11/02/2015 07:20   Dg Chest Port 1 View  11/01/2015  CLINICAL DATA:  Post CABG EXAM: PORTABLE CHEST 1 VIEW COMPARISON:  10/31/2015 FINDINGS: Cardiomediastinal silhouette is stable. Status post CABG. Stable endotracheal and NG tube position. Left chest tube is unchanged in position. Persistent central vascular congestion and mild interstitial prominence bilaterally suspicious for the mild interstitial edema. Left IJ sheath in place. Probable small left pleural effusion left basilar atelectasis or infiltrate. Right arm PICC line is unchanged in position. There is no pneumothorax. IMPRESSION: Status post CABG. Stable support apparatus. Stable left chest tube position. No pneumothorax. Again noted bilateral mild interstitial prominence and perihilar opacities suspicious for mild pulmonary edema. Probable small left pleural effusion left basilar atelectasis or infiltrate. Electronically Signed   By: Lahoma Crocker M.D.   On: 11/01/2015 09:57   Dg Chest Port 1 View  10/31/2015  CLINICAL DATA:  55 year old male with a history of endotracheal tube placement Patient has undergone coronary artery bypass grafting x4  10/27/2015. Placement of intra aortic balloon pump at this date. Ischemic cardiomyopathy. EXAM: PORTABLE CHEST 1 VIEW COMPARISON:  10/30/2015. FINDINGS: Endotracheal tube terminates approximately 3.4 cm above the carina, unchanged. Gastric tube terminates out of the field of view. Defibrillator pads project over the left and right chest. Bilateral thoracostomy tubes. Right upper extremity PICC. The mediastinal drain not visualized. Intra-aortic balloon pump not visualized. Surgical changes of median sternotomy and CABG. Mixed bilateral interstitial and airspace opacities. Low lung volumes. IMPRESSION: Low lung volumes with mixed bilateral interstitial and airspace opacities, similar to the comparison chest x-ray. Retrocardiac opacity may reflect persistent pleural fluid, and/or atelectasis. Surgical support apparatus appear unchanged, as above. Intra aortic balloon pump not visualized. Signed, Dulcy Fanny. Earleen Newport, DO [facility] Electronically Signed  By: Corrie Mckusick D.O.   On: 10/31/2015 07:25   Dg Chest Port 1 View  10/30/2015  CLINICAL DATA:  Post cavity.  Recent CABG. EXAM: PORTABLE CHEST 1 VIEW COMPARISON:  Portable film earlier today. FINDINGS: Overlying support apparatus redemonstrated. Tubes and lines remain stable. Elevated LEFT hemidiaphragm. No definite pneumothorax. Moderate vascular congestion may be increased. Cardiomegaly. IMPRESSION: Slight worsening aeration. Moderate vascular congestion may be increased. Electronically Signed   By: Staci Righter M.D.   On: 10/30/2015 18:39   Dg Chest Port 1 View  10/30/2015  CLINICAL DATA:  Status post coronary bypass grafting EXAM: PORTABLE CHEST 1 VIEW COMPARISON:  10/30/2015 FINDINGS: Cardiac shadow remains enlarged. Bilateral chest tubes are again seen. A right-sided PICC line and endotracheal tube are noted in satisfactory position. Left jugular sheath is noted. Elevation of left hemidiaphragm is  noted with left basilar atelectasis. No pneumothorax is noted. IMPRESSION: Postsurgical change with tubes and lines as described. Mild left basilar atelectasis. Electronically Signed   By: Inez Catalina M.D.   On: 10/30/2015 07:45   Dg Chest Port 1 View  10/30/2015  CLINICAL DATA:  A chest tube in place EXAM: PORTABLE CHEST 1 VIEW COMPARISON:  10/30/2015 FINDINGS: Postoperative changes in the mediastinum. Endotracheal tube with tip measuring 6.3 cm about the carina. Right PICC catheter with tip over the cavoatrial junction. Left central venous catheter sheath with tip over the left neck consistent location in the internal jugular vein. Bilateral chest tubes. Infiltration or atelectasis in the right lung base. Cardiac enlargement. No significant vascular congestion. No pneumothorax. IMPRESSION: Appliance positioned as described. Cardiac enlargement with infiltration or atelectasis in the right lung base. Bilateral chest tubes are present but no visualized pneumothorax. Electronically Signed   By: Lucienne Capers M.D.   On: 10/30/2015 03:07   Dg Chest Port 1 View  10/30/2015  CLINICAL DATA:  Shortness of breath.  Code blue. EXAM: PORTABLE CHEST 1 VIEW COMPARISON:  10/29/2015 FINDINGS: Postoperative changes in the mediastinum. Bilateral chest tubes are present. Right PICC line with tip over the cavoatrial junction. Left central venous catheter or sheath with tip over the left side of the neck, likely in the left internal jugular vein. Cardiac enlargement without significant vascular congestion. Shallow inspiration with elevation of the left hemidiaphragm. Probable left pleural effusion. No definite pulmonary consolidation. IMPRESSION: Shallow inspiration. Probable left pleural effusion. Cardiac enlargement. Appliances appear in satisfactory position. Electronically Signed   By: Lucienne Capers M.D.   On: 10/30/2015 01:18   Dg Chest Port 1 View  10/29/2015  CLINICAL DATA:  Postop CABG 2 days ago. EXAM:  PORTABLE CHEST 1 VIEW COMPARISON:  Portable chest x-ray of October 28, 2015 FINDINGS: The trachea and esophagus have been extubated. The cardiac silhouette remains enlarged. The pulmonary vascularity is mildly engorged. The bilateral chest tubes and the mediastinal drain are in stable position. There is no pneumothorax or large pleural effusion. The Swan-Ganz catheter tip overlies a proximal right lower lobe pulmonary artery branch. The mediastinum is widened and accentuated by the hypo inflation. IMPRESSION: Bilateral hypoinflation following extubation of the trachea there is crowding of the pulmonary vascularity. Left basilar atelectasis or less likely pneumonia is more prominent today. Probable small left pleural effusion. The remaining support tubes and lines are in stable position. Electronically Signed   By: David  Martinique M.D.   On: 10/29/2015 07:40   Dg Chest Port 1 View  10/28/2015  CLINICAL DATA:  Post CABG, on ventilator, followup portable chest x-ray of  10/28/2015 EXAM: PORTABLE CHEST 1 VIEW COMPARISON:  None. FINDINGS: The tip of the endotracheal tube is approximately 5.2 cm above the carina. Aeration has improved slightly. Atelectasis remains at the lung bases left-greater-than-right. Swan-Ganz catheter tip is in the right lower lobe pulmonary artery and bilateral chest tubes remain. No pneumothorax is seen. Heart size is stable. IMPRESSION: 1. Improved aeration. 2. Bilateral chest tubes.  No pneumothorax. 3. Tip of endotracheal tube approximately 5.2 cm above the carina. Electronically Signed   By: Ivar Drape M.D.   On: 10/28/2015 08:01   Dg Chest Port 1 View  10/27/2015  CLINICAL DATA:  Status post CABG EXAM: PORTABLE CHEST 1 VIEW COMPARISON:  10/27/2015 FINDINGS: Unchanged tracheostomy tube. Swan-Ganz central venous line is now seen with tip advanced into the right perihilar region. Stable right chest tube with and left chest tube. NG tube again crosses the gastroesophageal junction. Stable  mediastinal drain. Enlarged cardiac silhouette. Limited inspiratory effect with bibasilar opacities likely representing atelectasis. IMPRESSION: Anticipated postoperative appearance with bibasilar atelectasis. Electronically Signed   By: Skipper Cliche M.D.   On: 10/27/2015 19:25   Dg Chest Portable 1 View  10/27/2015  CLINICAL DATA:  Status post coronary bypass grafting EXAM: PORTABLE CHEST 1 VIEW COMPARISON:  10/23/2015 FINDINGS: Cardiac shadow is mildly enlarged. A Swan-Ganz catheter is noted in the right pulmonary outflow tract. Bilateral thoracostomy catheters are seen. No pneumothorax is noted. An endotracheal tube is noted in satisfactory position. Intra-aortic balloon pump is noted over the aortic arch. Mild left basilar atelectasis is seen. Mild central vascular congestion is noted. IMPRESSION: Postoperative changes with tubes and lines as described above. Mild vascular congestion and left basilar atelectasis. Electronically Signed   By: Inez Catalina M.D.   On: 10/27/2015 17:23   Portable Chest X-ray 1 View  10/21/2015  CLINICAL DATA:  55 year old male with a history of acute systolic congestive heart failure EXAM: PORTABLE CHEST 1 VIEW COMPARISON:  10/20/2015 FINDINGS: Heart size unchanged, enlarged. Similar appearance of low lung volumes with interstitial opacities, interlobular septal thickening, and developing airspace disease of the bilateral lower lungs. No pneumothorax. Linear opacity overlying the mediastinum and the heart border, of on certain significance. Favored to overlie the patient. IMPRESSION: Evidence of worsening congestive heart failure, with increasing edema. New linear opacity overlies the mediastinum, favored to be overlying the patient, though cannot be localized on this view. Signed, Dulcy Fanny. Earleen Newport, DO [facility] Electronically Signed   By: Corrie Mckusick D.O.   On: 10/21/2015 07:16   Dg Chest Portable 1  View  10/20/2015  CLINICAL DATA:  Shortness of breath. EXAM: PORTABLE CHEST 1 VIEW COMPARISON:  None. FINDINGS: Midline trachea. Cardiomegaly accentuated by AP portable technique. No pleural effusion or pneumothorax. Mildly low lung volumes with bibasilar atelectasis. IMPRESSION: Mild cardiomegaly and low lung volumes.  No acute findings. Electronically Signed   By: Abigail Miyamoto M.D.   On: 10/20/2015 18:09   Dg Abd Portable 1v  11/16/2015  CLINICAL DATA:  Ileus, acute MI with CHF EXAM: PORTABLE ABDOMEN - 1 VIEW COMPARISON:  Portable abdominal films of November 15, 2015 FINDINGS: There remain small amounts of gas within small bowel loops as well as and large bowel loops. No abnormally distended bowel loops are observed. There is gas in the stomach. A feeding tube is in place whose tip appears to lie at the duodenal -jejunal junction. IMPRESSION: Fairly stable appearance of the abdomen with moderate amounts of gas within small and  large bowel loops consistent with ileus. No free extraluminal gas collections are observed. Electronically Signed   By: David  Martinique M.D.   On: 11/16/2015 07:20   Dg Abd Portable 1v  11/15/2015  CLINICAL DATA:  Chronic ventilator dependent respiratory failure. Acute onset of nausea and vomiting. EXAM: PORTABLE ABDOMEN - 1 VIEW COMPARISON:  11/03/2015, 11/02/2015. FINDINGS: Feeding tube tip projects at the expected location of the 4th portion of the duodenum at or near the ligament Treitz. Nonobstructive bowel gas pattern. Gas in normal caliber loops of small bowel throughout the abdomen and pelvis. Gas throughout normal caliber colon from cecum to rectum. No suggestion of free intraperitoneal air. IMPRESSION: 1. Bowel gas pattern indicating a mild generalized ileus. No evidence of bowel obstruction. 2. Feeding tube tip appropriately positioned in the 4th portion of the duodenum at or near the ligament of Treitz. Electronically Signed   By: Evangeline Dakin M.D.   On: 11/15/2015 07:26    Dg Abd Portable 1v  11/02/2015  CLINICAL DATA:  Assess feeding tube positioning EXAM: PORTABLE ABDOMEN - 1 VIEW COMPARISON:  None in PACs FINDINGS: The repeated KUB with has less motion artifact. The tip of the feeding tube and nasogastric tube lie in the region of the distal gastric body in the pre-pyloric region. The bowel gas pattern is within the limits of normal. Numerous tubes and lines and electrodes overlie the abdomen. IMPRESSION: The tips of the feeding tube and the esophagogastric tube lie in the region of the distal gastric body in the pre-pyloric region. Electronically Signed   By: David  Martinique M.D.   On: 11/02/2015 12:16   Dg Addison Bailey G Tube Plc W/fl-no Rad  11/03/2015  CLINICAL DATA:  NASO G TUBE PLACEMENT WITH FLUORO Fluoroscopy was utilized by the requesting physician.  No radiographic interpretation.    PHYSICAL EXAM CVP 17 General: Sitting up in bed  Neck: JVP hard to see.  no thyromegaly or thyroid nodule.  S/p Trach  Lungs: mild rhonchi   CV: Nondisplaced PMI.  Heart regular S1/S2, no S3/S4, no murmur. 1+ edema. Abdomen: Distended. Nontender. Good BS.   Neurologic: alert and oriented x 3 Extremities: No clubbing or cyanosis.  Dopplerable pedal pulses. Maculopapular rash on his sides  TELEMETRY: A flutter 60-70s   ASSESSMENT AND PLAN:  1. CAD: S/p late presentation inferior MI and CABG x 4. Continue statin, ASA. Coronary angiography 3/27 with multiple VT episodes showed patent LIMA-LAD and SVG-RCA.  Patent SVG-ramus and SVG-D but slow flow in both grafts with diffusely diseased small target vessels.  2. Cardiogenic shock: Acute systolic CHF with RV failure post inferior MI with suspected RV involvement. EF 25-30% on echo (difficult images on 3/24 echo). He has significant RV failure from suspected RV infarct.  3/27 IABP placed to try to allow decrease in milrinone with frequent VT.  3/31 echo with EF 30-35%.  IABP out 4/2.  Milrinone off 4/7 - Off milrinone. Co-ox  ok - CVP remains up. Got IV lasix today. Will follow closely.  Trying to balance with hypernatremia. Still on free H2O for hypernatremia.  - No spiro/dig with CKD.  - He is now off NO and on sildenafil 40 tid.   -Continue hydralazine to 50 mg tid for afterload reduction (no Imdur with sildenafil use).   3. Complete heart block: Pacemaker turned off 4/3.  Underlying NSR in 70s. Now in AFL. Plan TEE/DC-CV this week.  4. VF arrest 3/24 early am and again at night 3/24.  Only  occasional PVCs today.  - Change amio to po.  5. AKI: Creatinine up but stable. Suspect ATN. Baseline creatinine 1.0. Renal following 6. ID: Possible LLL PNA.  He completed course of  vancomycin/levofloxacin.  7. Anemia: No overt bleeding.  Got 2 units PRBCs on 4/4. Hemoglobin 11>10.9.  8. Pulmonary: S/P Trach 4/5 on trach collar trials. 9. Hypernatremia: Sodium climbing despite free water boluses. Increase free water. May need D5w. Follow sodium closely. Will likely need to add lasix as well.  10. AFL - On heparin and IV amio.Heparin being kept low due to trach bleeding. Will need TEE/DCCV this week once we can full anti-coagulate.  11. Ileus - continue Reglan. Ambulate as tolerated.   Glori Bickers MD  11/17/2015 12:33 PM

## 2015-11-17 NOTE — Progress Notes (Signed)
Patient ID: Christopher [name], male   DOB: 1961/06/04, 55 y.o.   MRN: [id number]   ADVANCED HF ROUNDING NOTE  Subjective:  Sitting up in chair today on trach vent.  Still oozing from trach slightly  Has converted to NSR on amio.   Got D5W for hypernatremia yesterday. Serum sodium down slightly. CVP still up (12-14). Getting daily IV lasix  Ileus improving. Starting to ambulate in halls.     Scheduled Meds: . amiodarone  150 mg Intravenous Once  . amiodarone  200 mg Oral BID  . antiseptic oral rinse  7 mL Mouth Rinse QID  . aspirin  81 mg Oral Daily  . atorvastatin  80 mg Oral q1800  . chlorhexidine gluconate (SAGE KIT)  15 mL Mouth Rinse BID  . feeding supplement (PRO-STAT SUGAR FREE 64)  30 mL Per Tube TID  . free water  400 mL Per Tube Q3H  . hydrALAZINE  25 mg Oral 3 times per day  . insulin aspart  0-24 Units Subcutaneous 6 times per day  . insulin detemir  12 Units Subcutaneous BID  . levalbuterol  1.25 mg Nebulization TID  . pantoprazole sodium  40 mg Per Tube Daily  . potassium chloride  40 mEq Oral 3 times per day  . QUEtiapine  50 mg Oral QHS  . sildenafil  40 mg Oral TID  . silver sulfADIAZINE   Topical BID  . sodium chloride flush  10-40 mL Intracatheter Q12H  . triamcinolone cream   Topical BID   Continuous Infusions: . sodium chloride Stopped (11/15/15 0600)  . amiodarone Stopped (11/16/15 1223)  . dextrose    . feeding supplement (VITAL HIGH PROTEIN) 1,000 mL (11/16/15 0800)  . heparin 1,000 Units/hr (11/17/15 0000)   PRN Meds:.diphenhydrAMINE, fentaNYL (SUBLIMAZE) injection, levalbuterol, midazolam, ondansetron (ZOFRAN) IV, sodium chloride flush, traMADol    Filed Vitals:   11/17/15 0720 11/17/15 0852 11/17/15 0934 11/17/15 1137  BP:  131/88 133/91   Pulse:  82 85 82  Temp: 98.7 F (37.1 C)   98.6 F (37 C)  TempSrc: Oral   Oral  Resp:  14 21 16   Height:      Weight:      SpO2:  100% 100% 99%    Intake/Output Summary (Last 24 hours) at  11/17/15 1237 Last data filed at 11/17/15 0800  Gross per 24 hour  Intake 3296.4 ml  Output   1645 ml  Net 1651.4 ml    LABS: Basic Metabolic Panel:  Recent Labs  11/16/15 0400 11/16/15 1255 11/17/15 0416  NA 158* 154* 155*  K 4.0 3.8 4.3  CL 116* 113* 116*  CO2 30 28 27   GLUCOSE 119* 129* 107*  BUN 104* 102* 95*  CREATININE 2.42* 2.41* 2.34*  CALCIUM 9.1 8.9 9.0  MG 2.6*  --  2.6*   Liver Function Tests:  Recent Labs  11/17/15 0416  AST 50*  ALT 51  ALKPHOS 66  BILITOT 1.4*  PROT 6.5  ALBUMIN 2.6*    Recent Labs  11/17/15 0416  AMYLASE 72   CBC:  Recent Labs  11/16/15 0400 11/17/15 0416  WBC 12.2* 9.7  HGB 10.4* 10.3*  HCT 35.8* 35.4*  MCV 95.0 94.4  PLT 265 236   Cardiac Enzymes: No results for input(s): CKTOTAL, CKMB, CKMBINDEX, TROPONINI in the last 72 hours. BNP: Invalid input(s): POCBNP D-Dimer: No results for input(s): DDIMER in the last 72 hours. Hemoglobin A1C: No results for input(s): HGBA1C in the last 72  hours. Fasting Lipid Panel: No results for input(s): CHOL, HDL, LDLCALC, TRIG, CHOLHDL, LDLDIRECT in the last 72 hours. Thyroid Function Tests: No results for input(s): TSH, T4TOTAL, T3FREE, THYROIDAB in the last 72 hours.  Invalid input(s): FREET3 Anemia Panel: No results for input(s): VITAMINB12, FOLATE, FERRITIN, TIBC, IRON, RETICCTPCT in the last 72 hours.  RADIOLOGY: Dg Chest 2 View  10/23/2015  CLINICAL DATA:  CHF.  Weakness. EXAM: CHEST  2 VIEW COMPARISON:  10/21/2015 FINDINGS: Multiple interstitial densities in the lower chest are suggestive for interstitial edema, right side greater than left. Heart size is upper limits of normal. Trachea is midline. Negative for a pneumothorax. Evidence for a small right pleural effusion. IMPRESSION: Interstitial densities in the lower chest, right side greater than left. Findings are suggestive for interstitial pulmonary edema. Difficult to exclude some airspace disease in the right  lower lung. Evidence for small right pleural effusion. Electronically Signed   By: Markus Daft M.D.   On: 10/23/2015 08:27   Dg Abd 1 View  11/03/2015  CLINICAL DATA:  Enteric tube advanced to post pyloric position. EXAM: ABDOMEN - 1 VIEW COMPARISON:  11/02/2015 abdominal radiographs. FINDINGS: Fluoroscopy time 7 minutes. Enteric tube courses through the stomach and duodenum and appears to terminate near the duodenal jejunal junction. Instilled contrast opacifies nondilated proximal jejunal small bowel loops. No contrast leak is demonstrated. Separate enteric tube terminates in the body of the stomach. Visualized lower sternotomy wires appear aligned and intact. IMPRESSION: Enteric tube appears to terminate near the duodenal jejunal junction. Separate enteric tube terminates in the body of the stomach. Electronically Signed   By: Ilona Sorrel M.D.   On: 11/03/2015 17:02   Dg Chest Port 1 View  11/17/2015  CLINICAL DATA:  Shortness of breath. EXAM: PORTABLE CHEST 1 VIEW COMPARISON:  11/16/2015. FINDINGS: Tracheostomy tube, feeding tube, right PICC line, left subclavian central line in stable position. Prior CABG. Cardiomegaly. Left lower lobe infiltrate a small left pleural effusion. Mild right base infiltrate. No pneumothorax. IMPRESSION: 1.  Lines and tubes in stable position. 2. Persistent left lower lobe infiltrate and left pleural effusion. No interim change. Mild right lower lobe infiltrate. No change from prior exam . 3.  Stable cardiomegaly. Electronically Signed   By: Marcello Moores  Register   On: 11/17/2015 07:19   Dg Chest Port 1 View  11/16/2015  CLINICAL DATA:  Follow-up respiratory failure EXAM: PORTABLE CHEST 1 VIEW COMPARISON:  11/15/2014 FINDINGS: Prior CABG. Tracheostomy tube, right PICC line and left subclavian central line remain in place, unchanged. There is cardiomegaly with vascular congestion. Continued left lower lobe atelectasis or pneumonia and suspect small left pleural effusion. No  significant change since prior study. No confluent opacity on the right. IMPRESSION: Stable left lower lobe atelectasis or pneumonia with small left effusion. Stable cardiomegaly with vascular congestion. Electronically Signed   By: Rolm Baptise M.D.   On: 11/16/2015 07:27   Dg Chest Port 1 View  11/15/2015  CLINICAL DATA:  Chronic ventilator dependent respiratory failure. CABG and right carotid endarterectomy 10/27/2015. EXAM: PORTABLE CHEST 1 VIEW COMPARISON:  11/14/2015 and earlier. FINDINGS: Tracheostomy tube tip in satisfactory position below the thoracic inlet. Left subclavian central venous catheter tip projects over the mid SVC, unchanged. Right arm PICC tip projects over lower SVC at or near the cavoatrial junction, unchanged. Feeding tube courses below the diaphragm into the stomach. Sternotomy for CABG. Cardiac silhouette moderately enlarged. Mild pulmonary venous hypertension without overt edema, unchanged. Dense consolidation in the left lower  lobe with air bronchograms, unchanged. Moderate-sized left pleural effusion, unchanged. No new pulmonary parenchymal abnormalities. IMPRESSION: 1.  Support apparatus satisfactory. 2. Stable dense left lower lobe atelectasis and/or pneumonia and left pleural effusion. 3. Stable cardiomegaly and pulmonary venous hypertension without overt edema. 4. No new abnormalities. Electronically Signed   By: Evangeline Dakin M.D.   On: 11/15/2015 07:22   Dg Chest Port 1 View  11/14/2015  CLINICAL DATA:  55 year old male with a history of shortness of breath. Patient admission 10/20/2015. Right carotid endarterectomy 10/27/2015, with same-day coronary artery bypass grafting 10/27/2015. Twelve days postop now with history of intra-aortic balloon pump. EXAM: PORTABLE CHEST 1 VIEW COMPARISON:  11/13/2015, 11/12/2015, 11/11/2015 FINDINGS: Cardiomediastinal silhouette likely unchanged, with the left heart border partially obscured by overlying lung and pleural disease. Low lung  volumes persist with bibasilar opacities and obscuration of the retrocardiac region. Obscuration of left hemidiaphragm. Unchanged tracheostomy tube, enteric feeding tube terminating out of the field of view, right upper extremity PICC and left subclavian central line. IMPRESSION: Similar appearance of the chest x-ray, with persisting bibasilar airspace opacities and left pleural effusion. Unchanged support apparatus as above. Signed, Dulcy Fanny. Earleen Newport, DO [facility] Electronically Signed   By: Corrie Mckusick D.O.   On: 11/14/2015 08:48   Dg Chest Port 1 View  11/13/2015  CLINICAL DATA:  Shortness of breath. EXAM: PORTABLE CHEST 1 VIEW COMPARISON:  November 12, 2015. FINDINGS: Stable cardiomegaly. Status post coronary bypass graft. Tracheostomy and feeding tube are unchanged. Bilateral subclavian catheters are unchanged. No pneumothorax is noted. Stable bibasilar opacities are noted with left greater than right. Stable left pleural effusion is noted. Bony thorax is unremarkable. IMPRESSION: Stable support apparatus. Stable bibasilar opacities. Stable left pleural effusion. Electronically Signed   By: Marijo Conception, M.D.   On: 11/13/2015 07:37   Dg Chest Port 1 View  11/12/2015  CLINICAL DATA:  Respiratory failure. EXAM: PORTABLE CHEST 1 VIEW COMPARISON:  11/11/2015. FINDINGS: Tracheostomy tube, feeding tube, left IJ line, right PICC line in stable position . Prior CABG. Cardiomegaly with diffuse bilateral pulmonary infiltrates consistent pulmonary edema. Progressed from prior exam. Small bilateral pleural effusions. No pneumothorax. IMPRESSION: 1. Lines and tubes in stable position. 2. Prior CABG. Cardiomegaly with progressive bilateral pulmonary infiltrates consistent with pulmonary edema. Small bilateral pleural effusions are noted. Electronically Signed   By: Marcello Moores  Register   On: 11/12/2015 07:46   Dg Chest Port 1 View  11/11/2015  CLINICAL DATA:   Tracheostomy placement EXAM: PORTABLE CHEST 1 VIEW COMPARISON:  11/11/2015 FINDINGS: Tracheostomy tube has been placed with the tip projecting over the mid trachea. No pneumothorax. Right PICC line remains in place, unchanged as does left central line. Cardiomegaly with bilateral perihilar and lower lobe opacities, likely edema. Suspect layering effusions. IMPRESSION: Interval placement tracheostomy tube without pneumothorax. Suspected CHF. Bilateral effusions. Findings similar to prior study. Electronically Signed   By: Rolm Baptise M.D.   On: 11/11/2015 14:50   Dg Chest Port 1 View  11/11/2015  CLINICAL DATA:  Respiratory failure. EXAM: PORTABLE CHEST 1 VIEW COMPARISON:  11/10/2015. FINDINGS: Endotracheal tube, feeding tube, bilateral subclavian lines in stable position. Prior CABG. Cardiomegaly with diffuse bilateral pulmonary infiltrates consistent with pulmonary edema. Worsening from prior exam. Small left pleural effusion. No pneumothorax. IMPRESSION: 1.  Lines and tubes in stable position. 2. Prior CABG. Cardiomegaly with worsening bilateral pulmonary edema. Small left pleural effusion. Electronically Signed   By: Marcello Moores  Register  On: 11/11/2015 07:17   Dg Chest Port 1 View  11/10/2015  CLINICAL DATA:  Shortness of breath . EXAM: PORTABLE CHEST 1 VIEW COMPARISON:  11/09/2015. FINDINGS: Endotracheal tube, NG tube, right PICC line, left subclavian line in stable position. Prior CABG. Cardiomegaly. Left lower lobe atelectasis and/or infiltrate with small left pleural effusion. Mild right base atelectasis and or infiltrate. No pneumothorax . IMPRESSION: 1.  Lines and tubes in stable position. 2. Left lower lobe atelectasis and/or infiltrate with small left pleural effusion. Mild right base atelectasis and/or infiltrate. Findings stable from prior exam. 3.  Prior CABG.  Stable cardiomegaly. Electronically Signed   By: Marcello Moores  Register   On: 11/10/2015 07:51   Dg Chest Port 1 View  11/09/2015  CLINICAL  DATA:  Atelectasis. EXAM: PORTABLE CHEST 1 VIEW COMPARISON:  11/08/2015 FINDINGS: Endotracheal tube in satisfactory position. Right arm PICC tip in the lower SVC unchanged. Left subclavian central venous catheter in the SVC. NG tube and feeding tube enters the stomach with the tips not visualized. Negative for pneumothorax. Left lower lobe airspace disease unchanged. Small left effusion unchanged. Mild pulmonary vascular congestion unchanged. IMPRESSION: Support lines remain in satisfactory position and unchanged Left lower lobe atelectasis/ infiltrate unchanged. Mild right lower lobe airspace consolidation stable. Mild vascular congestion unchanged. Electronically Signed   By: Franchot Gallo M.D.   On: 11/09/2015 07:36   Dg Chest Port 1 View  11/08/2015  CLINICAL DATA:  Respiratory failure, hypertension, coronary artery disease post MI and CABG EXAM: PORTABLE CHEST 1 VIEW COMPARISON:  Portable exam 1216 hours compared to 11/06/2015 FINDINGS: Tip of endotracheal tube projects 6.8 cm above carina. Nasogastric tube and feeding tube extends into stomach. LEFT subclavian central venous catheter with tip projecting over SVC. RIGHT arm PICC line tip projects over SVC. Enlargement of cardiac silhouette post CABG. Stable mediastinal contours. Increased LEFT lower lobe consolidation. Slightly increased infiltrate at RIGHT base as well. Minimal central peribronchial thickening. No pleural effusion or pneumothorax. IMPRESSION: Enlargement of cardiac silhouette post CABG. Stable line and tube positions. Increased LEFT lower lobe consolidation with minimally increased infiltrate at RIGHT base as well. Findings are suspicious for pneumonia involving the LEFT lower lobe though a component of coexisting pulmonary edema may be present. Electronically Signed   By: Lavonia Dana M.D.   On: 11/08/2015 12:47   Dg Chest Port 1 View  11/06/2015  CLINICAL DATA:  Acute CHF secondary to acute MI, complete heart block. EXAM: PORTABLE CHEST  1 VIEW COMPARISON:  Portable chest x-ray of November 05, 2015 FINDINGS: The lungs are adequately inflated. The pulmonary interstitial markings are less prominent on the right today. They have improved on the left as well and the hemidiaphragm is now visible. There is persistent interstitial edema on the left. The cardiac silhouette remains enlarged. The intraaortic balloon pump marker tip projects over the proximal portion of descending thoracic aorta and appears stable. The endotracheal tube tip projects 5.5 cm above the carina. The nasogastric and Doppler AH feeding tubes have their tips below the inferior margin of the image. The right-sided PICC line tip projects over the junction of the middle and distal thirds of the SVC. The left subclavian venous catheter tip projects over the midportion of the SVC. IMPRESSION: Decreasing pulmonary edema consistent with improvement in CHF. Persistent perihilar interstitial edema on the left. The support tubes and devices are in reasonable position. Electronically Signed   By: David  Martinique M.D.   On: 11/06/2015 07:32   Dg Chest [facility]  1 View  11/05/2015  CLINICAL DATA:  Respiratory failure. EXAM: PORTABLE CHEST 1 VIEW COMPARISON:  11/04/2015 . FINDINGS: Endotracheal tube, NG tube, right PICC line stable position. Prior CABG. Cardiomegaly. Persistent left mid and lower lung field infiltrate. Persistent mild atelectatic changes right upper and right lower lobes. No pneumothorax. IMPRESSION: 1.  Lines and tubes in stable position. 2. Persistent left mid and left lower lung infiltrate, no interim change. Persistent right upper lobe and right lower lobe subsegmental atelectasis. No interim change. 3. Prior CABG.  Stable cardiomegaly. Electronically Signed   By: Marcello Moores  Register   On: 11/05/2015 07:48   Dg Chest Port 1 View  11/04/2015  CLINICAL DATA:  Hypoxia EXAM: PORTABLE CHEST 1 VIEW COMPARISON:  November 04, 2015 FINDINGS: Endotracheal tube tip is 5.3 cm above the carina.  Central catheter tip is in the superior vena cava near the cavoatrial junction. Nasogastric tube tip and side port are below the diaphragm. There also is a feeding tube with the tip below the diaphragm. No pneumothorax. There has been removal of a left chest tube. There is airspace consolidation throughout the left mid and lower lung zones, stable. There is mild atelectasis in the right upper lobe, stable. No new opacity is evident. There is stable cardiomegaly. The pulmonary vascularity is normal. No adenopathy is evident. IMPRESSION: Tube and catheter positions as described without pneumothorax. Airspace consolidation throughout the left mid and lower lung zones, stable. Atelectasis right upper lobe, stable. No new opacity. Stable cardiomegaly. Electronically Signed   By: Lowella Grip III M.D.   On: 11/04/2015 14:52   Dg Chest Port 1 View  11/04/2015  CLINICAL DATA:  Status post CABG 10/28/2015. EXAM: PORTABLE CHEST 1 VIEW COMPARISON:  Single view of the chest 11/03/2015 and 11/02/2015. FINDINGS: Support tubes and lines including a left chest tube are unchanged since the most recent exam. The chest is better expanded today with decreased atelectasis. Airspace opacity in the left mid and lower lung zones persists. There is likely a left pleural effusion. Marked cardiomegaly is seen with only mild vascular congestion noted. IMPRESSION: Increased pulmonary expansion with decreased scattered atelectasis. Left mid and lower lung zone airspace disease could be due to atelectasis or pneumonia. Small left pleural effusion noted. Support tubes and lines projecting good position.  No pneumothorax. Electronically Signed   By: Inge Rise M.D.   On: 11/04/2015 07:30   Dg Chest Port 1 View  11/03/2015  CLINICAL DATA:  Central line placement. EXAM: PORTABLE CHEST 1 VIEW COMPARISON:  11/03/2015. FINDINGS: Left subclavian central line noted with tip at the cavoatrial junction. Interval removal of right chest tube.  Endotracheal tube, NG tube, right PICC line, left IJ sheath left chest tube in stable position. No pneumothorax. Prior CABG. Stable cardiomegaly. Low lung volumes. A mild component of congestive heart failure cannot be excluded. Similar findings noted on prior exam P IMPRESSION: 1. Interim placement of left subclavian central line, its tip is at the cavoatrial junction. Interim removal right chest tube . No pneumothorax. 2. Remaining lines and tubes including left chest tube in stable position. 3. Prior CABG. Stable cardiomegaly. Low lung volumes. A a mild component congestive heart failure cannot be excluded . Similar findings noted on prior exam. Electronically Signed   By: [place]   On: 11/03/2015 09:58   Dg Chest Port 1 View  11/03/2015  CLINICAL DATA:  Status post CABG on October 27, 2015 persistent left lower lobe atelectasis or pneumonia. No pneumothorax or significant  pleural effusion. EXAM: PORTABLE CHEST 1 VIEW COMPARISON:  Portable chest x-ray of November 02, 2015 at 5:48 a.m. FINDINGS: There has been interval placement of an intra aortic balloon pump whose marker lies over the junction of the aortic arch with the proximal descending thoracic aorta. The cardiac silhouette remains enlarged. The pulmonary vascularity remains mildly prominent. The retrocardiac region on the left remains dense. Bilateral chest tubes are in stable position. There is no pneumothorax nor significant pleural effusion. The endotracheal tube tip lies approximately 6.5 cm above the carina. The esophagogastric tube tip projects below the inferior margin of the image. The right-sided PICC line tip projects over the midportion of the SVC. A previously demonstrated pericardial drain is not clearly evident on today's study. IMPRESSION: Mild CHF. Persistent left lower lobe atelectasis or pneumonia. Interval placement of an intra aortic balloon pump which appears to be in appropriate position radiographically. The other support  tubes and lines are in reasonable position. Electronically Signed   By: David  Martinique M.D.   On: 11/03/2015 07:27   Dg Chest Port 1 View  11/02/2015  CLINICAL DATA:  CABG. EXAM: PORTABLE CHEST 1 VIEW COMPARISON:  11/01/2015. FINDINGS: Endotracheal tube, NG tube, right PICC line, bilateral chest tubes in stable position. Prior CABG. Stable cardiomegaly . Persistent mild interstitial prominence, congestive heart failure cannot be excluded. No interim change. Low lung volumes with basilar atelectasis . IMPRESSION: 1. Endotracheal tube, NG tube, right PICC line, bilateral chest tubes in stable position . No pneumothorax. 2. Prior CABG. Cardiomegaly with pulmonary interstitial prominence and small left pleural effusion consistent with mild congestive heart failure. No interim change from prior exam . Electronically Signed   By: Marcello Moores  Register   On: 11/02/2015 07:20   Dg Chest Port 1 View  11/01/2015  CLINICAL DATA:  Post CABG EXAM: PORTABLE CHEST 1 VIEW COMPARISON:  10/31/2015 FINDINGS: Cardiomediastinal silhouette is stable. Status post CABG. Stable endotracheal and NG tube position. Left chest tube is unchanged in position. Persistent central vascular congestion and mild interstitial prominence bilaterally suspicious for the mild interstitial edema. Left IJ sheath in place. Probable small left pleural effusion left basilar atelectasis or infiltrate. Right arm PICC line is unchanged in position. There is no pneumothorax. IMPRESSION: Status post CABG. Stable support apparatus. Stable left chest tube position. No pneumothorax. Again noted bilateral mild interstitial prominence and perihilar opacities suspicious for mild pulmonary edema. Probable small left pleural effusion left basilar atelectasis or infiltrate. Electronically Signed   By: Lahoma Crocker M.D.   On: 11/01/2015 09:57   Dg Chest Port 1 View  10/31/2015  CLINICAL DATA:  55 year old male with a history of endotracheal tube placement Patient has  undergone coronary artery bypass grafting x4 10/27/2015. Placement of intra aortic balloon pump at this date. Ischemic cardiomyopathy. EXAM: PORTABLE CHEST 1 VIEW COMPARISON:  10/30/2015. FINDINGS: Endotracheal tube terminates approximately 3.4 cm above the carina, unchanged. Gastric tube terminates out of the field of view. Defibrillator pads project over the left and right chest. Bilateral thoracostomy tubes. Right upper extremity PICC. The mediastinal drain not visualized. Intra-aortic balloon pump not visualized. Surgical changes of median sternotomy and CABG. Mixed bilateral interstitial and airspace opacities. Low lung volumes. IMPRESSION: Low lung volumes with mixed bilateral interstitial and airspace opacities, similar to the comparison chest x-ray. Retrocardiac opacity may reflect persistent pleural fluid, and/or atelectasis. Surgical support apparatus appear unchanged, as above. Intra aortic balloon pump not visualized. Signed, Dulcy Fanny. Earleen Newport, DO [facility] Electronically  Signed   By: Corrie Mckusick D.O.   On: 10/31/2015 07:25   Dg Chest Port 1 View  10/30/2015  CLINICAL DATA:  Post cavity.  Recent CABG. EXAM: PORTABLE CHEST 1 VIEW COMPARISON:  Portable film earlier today. FINDINGS: Overlying support apparatus redemonstrated. Tubes and lines remain stable. Elevated LEFT hemidiaphragm. No definite pneumothorax. Moderate vascular congestion may be increased. Cardiomegaly. IMPRESSION: Slight worsening aeration. Moderate vascular congestion may be increased. Electronically Signed   By: Staci Righter M.D.   On: 10/30/2015 18:39   Dg Chest Port 1 View  10/30/2015  CLINICAL DATA:  Status post coronary bypass grafting EXAM: PORTABLE CHEST 1 VIEW COMPARISON:  10/30/2015 FINDINGS: Cardiac shadow remains enlarged. Bilateral chest tubes are again seen. A right-sided PICC line and endotracheal tube are noted in satisfactory position. Left jugular sheath  is noted. Elevation of left hemidiaphragm is noted with left basilar atelectasis. No pneumothorax is noted. IMPRESSION: Postsurgical change with tubes and lines as described. Mild left basilar atelectasis. Electronically Signed   By: Inez Catalina M.D.   On: 10/30/2015 07:45   Dg Chest Port 1 View  10/30/2015  CLINICAL DATA:  A chest tube in place EXAM: PORTABLE CHEST 1 VIEW COMPARISON:  10/30/2015 FINDINGS: Postoperative changes in the mediastinum. Endotracheal tube with tip measuring 6.3 cm about the carina. Right PICC catheter with tip over the cavoatrial junction. Left central venous catheter sheath with tip over the left neck consistent location in the internal jugular vein. Bilateral chest tubes. Infiltration or atelectasis in the right lung base. Cardiac enlargement. No significant vascular congestion. No pneumothorax. IMPRESSION: Appliance positioned as described. Cardiac enlargement with infiltration or atelectasis in the right lung base. Bilateral chest tubes are present but no visualized pneumothorax. Electronically Signed   By: Lucienne Capers M.D.   On: 10/30/2015 03:07   Dg Chest Port 1 View  10/30/2015  CLINICAL DATA:  Shortness of breath.  Code blue. EXAM: PORTABLE CHEST 1 VIEW COMPARISON:  10/29/2015 FINDINGS: Postoperative changes in the mediastinum. Bilateral chest tubes are present. Right PICC line with tip over the cavoatrial junction. Left central venous catheter or sheath with tip over the left side of the neck, likely in the left internal jugular vein. Cardiac enlargement without significant vascular congestion. Shallow inspiration with elevation of the left hemidiaphragm. Probable left pleural effusion. No definite pulmonary consolidation. IMPRESSION: Shallow inspiration. Probable left pleural effusion. Cardiac enlargement. Appliances appear in satisfactory position. Electronically Signed   By: Lucienne Capers M.D.   On: 10/30/2015 01:18   Dg Chest Port 1 View  10/29/2015  CLINICAL  DATA:  Postop CABG 2 days ago. EXAM: PORTABLE CHEST 1 VIEW COMPARISON:  Portable chest x-ray of October 28, 2015 FINDINGS: The trachea and esophagus have been extubated. The cardiac silhouette remains enlarged. The pulmonary vascularity is mildly engorged. The bilateral chest tubes and the mediastinal drain are in stable position. There is no pneumothorax or large pleural effusion. The Swan-Ganz catheter tip overlies a proximal right lower lobe pulmonary artery branch. The mediastinum is widened and accentuated by the hypo inflation. IMPRESSION: Bilateral hypoinflation following extubation of the trachea there is crowding of the pulmonary vascularity. Left basilar atelectasis or less likely pneumonia is more prominent today. Probable small left pleural effusion. The remaining support tubes and lines are in stable position. Electronically Signed   By: David  Martinique M.D.   On: 10/29/2015 07:40   Dg Chest Port 1 View  10/28/2015  CLINICAL DATA:  Post CABG, on ventilator, followup portable  chest x-ray of 10/28/2015 EXAM: PORTABLE CHEST 1 VIEW COMPARISON:  None. FINDINGS: The tip of the endotracheal tube is approximately 5.2 cm above the carina. Aeration has improved slightly. Atelectasis remains at the lung bases left-greater-than-right. Swan-Ganz catheter tip is in the right lower lobe pulmonary artery and bilateral chest tubes remain. No pneumothorax is seen. Heart size is stable. IMPRESSION: 1. Improved aeration. 2. Bilateral chest tubes.  No pneumothorax. 3. Tip of endotracheal tube approximately 5.2 cm above the carina. Electronically Signed   By: Ivar Drape M.D.   On: 10/28/2015 08:01   Dg Chest Port 1 View  10/27/2015  CLINICAL DATA:  Status post CABG EXAM: PORTABLE CHEST 1 VIEW COMPARISON:  10/27/2015 FINDINGS: Unchanged tracheostomy tube. Swan-Ganz central venous line is now seen with tip advanced into the right perihilar region. Stable right chest tube with and left chest tube. NG tube again crosses the  gastroesophageal junction. Stable mediastinal drain. Enlarged cardiac silhouette. Limited inspiratory effect with bibasilar opacities likely representing atelectasis. IMPRESSION: Anticipated postoperative appearance with bibasilar atelectasis. Electronically Signed   By: Skipper Cliche M.D.   On: 10/27/2015 19:25   Dg Chest Portable 1 View  10/27/2015  CLINICAL DATA:  Status post coronary bypass grafting EXAM: PORTABLE CHEST 1 VIEW COMPARISON:  10/23/2015 FINDINGS: Cardiac shadow is mildly enlarged. A Swan-Ganz catheter is noted in the right pulmonary outflow tract. Bilateral thoracostomy catheters are seen. No pneumothorax is noted. An endotracheal tube is noted in satisfactory position. Intra-aortic balloon pump is noted over the aortic arch. Mild left basilar atelectasis is seen. Mild central vascular congestion is noted. IMPRESSION: Postoperative changes with tubes and lines as described above. Mild vascular congestion and left basilar atelectasis. Electronically Signed   By: Inez Catalina M.D.   On: 10/27/2015 17:23   Portable Chest X-ray 1 View  10/21/2015  CLINICAL DATA:  55 year old male with a history of acute systolic congestive heart failure EXAM: PORTABLE CHEST 1 VIEW COMPARISON:  10/20/2015 FINDINGS: Heart size unchanged, enlarged. Similar appearance of low lung volumes with interstitial opacities, interlobular septal thickening, and developing airspace disease of the bilateral lower lungs. No pneumothorax. Linear opacity overlying the mediastinum and the heart border, of on certain significance. Favored to overlie the patient. IMPRESSION: Evidence of worsening congestive heart failure, with increasing edema. New linear opacity overlies the mediastinum, favored to be overlying the patient, though cannot be localized on this view. Signed, Dulcy Fanny. Earleen Newport, DO [facility] Electronically Signed   By: Corrie Mckusick D.O.   On: 10/21/2015 07:16     Dg Chest Portable 1 View  10/20/2015  CLINICAL DATA:  Shortness of breath. EXAM: PORTABLE CHEST 1 VIEW COMPARISON:  None. FINDINGS: Midline trachea. Cardiomegaly accentuated by AP portable technique. No pleural effusion or pneumothorax. Mildly low lung volumes with bibasilar atelectasis. IMPRESSION: Mild cardiomegaly and low lung volumes.  No acute findings. Electronically Signed   By: Abigail Miyamoto M.D.   On: 10/20/2015 18:09   Dg Abd Portable 1v  11/16/2015  CLINICAL DATA:  Ileus, acute MI with CHF EXAM: PORTABLE ABDOMEN - 1 VIEW COMPARISON:  Portable abdominal films of November 15, 2015 FINDINGS: There remain small amounts of gas within small bowel loops as well as and large bowel loops. No abnormally distended bowel loops are observed. There is gas in the stomach. A feeding tube is in place whose tip appears to lie at the duodenal -jejunal junction. IMPRESSION: Fairly stable appearance of the abdomen with moderate amounts of  gas within small and large bowel loops consistent with ileus. No free extraluminal gas collections are observed. Electronically Signed   By: David  Martinique M.D.   On: 11/16/2015 07:20   Dg Abd Portable 1v  11/15/2015  CLINICAL DATA:  Chronic ventilator dependent respiratory failure. Acute onset of nausea and vomiting. EXAM: PORTABLE ABDOMEN - 1 VIEW COMPARISON:  11/03/2015, 11/02/2015. FINDINGS: Feeding tube tip projects at the expected location of the 4th portion of the duodenum at or near the ligament Treitz. Nonobstructive bowel gas pattern. Gas in normal caliber loops of small bowel throughout the abdomen and pelvis. Gas throughout normal caliber colon from cecum to rectum. No suggestion of free intraperitoneal air. IMPRESSION: 1. Bowel gas pattern indicating a mild generalized ileus. No evidence of bowel obstruction. 2. Feeding tube tip appropriately positioned in the 4th portion of the duodenum at or near the ligament of Treitz. Electronically Signed   By: Evangeline Dakin M.D.    On: 11/15/2015 07:26   Dg Abd Portable 1v  11/02/2015  CLINICAL DATA:  Assess feeding tube positioning EXAM: PORTABLE ABDOMEN - 1 VIEW COMPARISON:  None in PACs FINDINGS: The repeated KUB with has less motion artifact. The tip of the feeding tube and nasogastric tube lie in the region of the distal gastric body in the pre-pyloric region. The bowel gas pattern is within the limits of normal. Numerous tubes and lines and electrodes overlie the abdomen. IMPRESSION: The tips of the feeding tube and the esophagogastric tube lie in the region of the distal gastric body in the pre-pyloric region. Electronically Signed   By: David  Martinique M.D.   On: 11/02/2015 12:16   Dg Addison Bailey G Tube Plc W/fl-no Rad  11/03/2015  CLINICAL DATA:  NASO G TUBE PLACEMENT WITH FLUORO Fluoroscopy was utilized by the requesting physician.  No radiographic interpretation.    PHYSICAL EXAM CVP 12-14 General: Sitting up in bed  Neck: JVP hard to see.  no thyromegaly or thyroid nodule.  S/p Trach  Lungs: mild rhonchi   CV: Nondisplaced PMI.  Heart regular S1/S2, no S3/S4, no murmur. 1+ edema. Abdomen: Distended. Nontender. Good BS.   Neurologic: alert and oriented x 3 Extremities: No clubbing or cyanosis.  Dopplerable pedal pulses. Maculopapular rash on his sides  TELEMETRY: A flutter 60-70s   ASSESSMENT AND PLAN:  1. CAD: S/p late presentation inferior MI and CABG x 4. Continue statin, ASA. Coronary angiography 3/27 with multiple VT episodes showed patent LIMA-LAD and SVG-RCA.  Patent SVG-ramus and SVG-D but slow flow in both grafts with diffusely diseased small target vessels.  2. Cardiogenic shock: Acute systolic CHF with RV failure post inferior MI with suspected RV involvement. EF 25-30% on echo (difficult images on 3/24 echo). He has significant RV failure from suspected RV infarct.  3/27 IABP placed to try to allow decrease in milrinone with frequent VT.  3/31 echo with EF 30-35%.  IABP out 4/2.  Milrinone off 4/7 -  Off milrinone. Stable - CVP remains up. Getting lasix pushes. On D5W for hypernatremia.  - No spiro/dig with CKD.  - He is now off NO and on sildenafil 40 tid.   -Continue hydralazine to 50 mg tid for afterload reduction (no Imdur with sildenafil use).   3. Complete heart block: Pacemaker turned off 4/3.  Underlying NSR in 70s. Now in AFL. Plan TEE/DC-CV this week. Possibly tomorrow or Wednesday.  4. VF arrest 3/24 early am and again at night 3/24.  Only occasional PVCs today.  -  Change amio to po.  5. AKI: Creatinine stable 2.3-2.4 Suspect ATN. Baseline creatinine 1.0. Renal following 6. ID: Possible LLL PNA.  He completed course of  vancomycin/levofloxacin.  7. Anemia: Oozing from trach  Got 2 units PRBCs on 4/4. hgb 10.3 8. Pulmonary: S/P Trach 4/5 on trach collar trials. 9. Hypernatremia: Sodium climbing despite free water boluses. On D5W  Renal following. 10. AFL - Bck in NSR. On heparin and IV amio.  11. Ileus - continue Reglan. Ambulate as tolerated.   Glori Bickers MD  11/17/2015 12:37 PM

## 2015-11-17 NOTE — Progress Notes (Signed)
Physical Therapy Treatment Patient Details Name: Christopher [name] MRN: [id number] DOB: 09-13-1960 Today's Date: 11/17/2015    History of Present Illness [name] w/ ischemic cardiomyopathy following MI. Patient underwent CABG & CEA. Post-operative course complicated by arrhythmia and biventricular failure, HCAP, heart block, trach placement.    PT Comments    Pt improved ambulation distance today and tolerated well on trach collar. He was able to clear his secretions on his own during the walk and SpO2 remained greater than 92% on 55% trach collar. Pt seems very anxious to get out of his hospital room and would benefit from making an assisted trip outside to help improve his mood and affect. Pt continues to make improvements with PT and will continue to benefit from PT services. Pt would also benefit from CIR to improve independence with functional mobility before returning home once medically appropriate.    Follow Up Recommendations  CIR;Supervision/Assistance - 24 hour     Equipment Recommendations  Other (comment) (TBD)    Recommendations for Other Services Rehab consult;OT consult     Precautions / Restrictions Precautions Precautions: Sternal;Fall Restrictions Weight Bearing Restrictions: Yes Other Position/Activity Restrictions: Sternal preacutions    Mobility  Bed Mobility               General bed mobility comments: Pt in chair upon entry to room and remained in chair at end of session.   Transfers Overall transfer level: Needs assistance Equipment used: None Transfers: Sit to/from Stand Sit to Stand: Min guard         General transfer comment: Min Guard for safety. VC's for hand placement and sequencing.   Ambulation/Gait Ambulation/Gait assistance: Min guard Ambulation Distance (Feet): 220 Feet Assistive device:  (pushed w/c) Gait Pattern/deviations: Step-through pattern;Decreased stride length;Drifts right/left;Trunk flexed;Wide base of  support Gait velocity: decreased   General Gait Details: Min Guard for safety. VC's to slow down and take his time and for maneuvering around obstacles. Able to increase distance today.    Stairs            Wheelchair Mobility    Modified Rankin (Stroke Patients Only)       Balance Overall balance assessment: Needs assistance Sitting-balance support: Feet supported Sitting balance-Leahy Scale: Fair     Standing balance support: Bilateral upper extremity supported Standing balance-Leahy Scale: Poor Standing balance comment: Reliant on UE support.                     Cognition Arousal/Alertness: Awake/alert Behavior During Therapy: WFL for tasks assessed/performed;Flat affect Overall Cognitive Status: Difficult to assess                      Exercises      General Comments General comments (skin integrity, edema, etc.): Pt very eager to work with therapy. Flat affect. He wants to go outside.       Pertinent Vitals/Pain Pain Assessment: No/denies pain. SpO2 >92% on 55% trach collar.     Home Living                      Prior Function            PT Goals (current goals can now be found in the care plan section) Acute Rehab PT Goals Patient Stated Goal: Unable to verbalize at this time. PT Goal Formulation: With patient Time For Goal Achievement: 11/29/15 Potential to Achieve Goals: Good Progress towards PT goals: Progressing toward  goals    Frequency  Min 3X/week    PT Plan Current plan remains appropriate    Co-evaluation             End of Session Equipment Utilized During Treatment: Gait belt;Oxygen Activity Tolerance: Patient tolerated treatment well Patient left: in chair;with call bell/phone within reach;with family/visitor present     Time: 1740-8144 PT Time Calculation (min) (ACUTE ONLY): 20 min  Charges:  $Gait Training: 8-22 mins                    G Codes:      Everlean Cherry, SPT Everlean Cherry 11/17/2015, 1:06 PM

## 2015-11-17 NOTE — Progress Notes (Signed)
15 Days Post-Op Procedure(s) (LRB): Right/Left Heart Cath and Coronary/Graft Angiography (N/A) Intra-Aortic Balloon Pump Insertion Subjective: Back in sinus rhythm had a good night Chest x-ray with atelectasis at left base Urine output adequate on 40 Lasix IV daily Resuming tube feeds Abdominal exam and abdominal x-ray are improved, C. difficile toxin is negative Plan on trach collar trials today and further ambulation in hallway Swallow evaluation once he is on trach collar 24 hours a day  Objective: Vital signs in last 24 hours: Temp:  [98 F (36.7 C)-99.1 F (37.3 C)] 98.7 F (37.1 C) (04/11 0720) Pulse Rate:  [71-81] 76 (04/11 0700) Cardiac Rhythm:  [-] Normal sinus rhythm (04/11 0748) Resp:  [13-21] 15 (04/11 0700) BP: (97-131)/(65-96) 124/84 mmHg (04/11 0700) SpO2:  [94 %-100 %] 100 % (04/11 0700) FiO2 (%):  [40 %] 40 % (04/11 0429) Weight:  [230 lb 13.2 oz (104.7 kg)] 230 lb 13.2 oz (104.7 kg) (04/11 0500)  Hemodynamic parameters for last 24 hours: CVP:  [10 mmHg-72 mmHg] 12 mmHg  Intake/Output from previous day: 04/10 0701 - 04/11 0700 In: 4364.9 [P.O.:20; I.V.:2739.9; NG/GT:1605] Out: 2345 [Urine:2170; Stool:175] Intake/Output this shift: Total I/O In: 10 [I.V.:10] Out: 100 [Urine:100]  Alert and comfortable Heart rhythm regular without murmur Mild peripheral edema, CVP now normal Extremities warm Surgical incisions healing well  Lab Results:  Recent Labs  11/16/15 0400 11/17/15 0416  WBC 12.2* 9.7  HGB 10.4* 10.3*  HCT 35.8* 35.4*  PLT 265 236   BMET:  Recent Labs  11/16/15 1255 11/17/15 0416  NA 154* 155*  K 3.8 4.3  CL 113* 116*  CO2 28 27  GLUCOSE 129* 107*  BUN 102* 95*  CREATININE 2.41* 2.34*  CALCIUM 8.9 9.0    PT/INR: No results for input(s): LABPROT, INR in the last 72 hours. ABG    Component Value Date/Time   PHART 7.419 11/13/2015 0526   HCO3 35.5* 11/13/2015 0526   TCO2 28 11/14/2015 1622   ACIDBASEDEF TEST WILL BE  CREDITED 11/01/2015 0446   O2SAT 65.1 11/16/2015 0440   CBG (last 3)   Recent Labs  11/16/15 2330 11/17/15 0423 11/17/15 0712  GLUCAP 114* 95 94    Assessment/Plan: S/P Procedure(s) (LRB): Right/Left Heart Cath and Coronary/Graft Angiography (N/A) Intra-Aortic Balloon Pump Insertion Mobilize Diuresis Resume tube feeds slowly back to goal   LOS: 28 days    Christopher [name] 11/17/2015

## 2015-11-17 NOTE — Progress Notes (Signed)
Nutrition Follow Up  DOCUMENTATION CODES:   Obesity unspecified  INTERVENTION:   Continue Vital High Protein formula at goal rate of 50 ml/hr with Prostat liquid protein 30 ml TID  Total TF regimen providing 1500 kcals, 150 gm protein, 1003 ml of free water  NUTRITION DIAGNOSIS:   Inadequate oral intake related to inability to eat as evidenced by NPO status, ongoing  GOAL:   Provide needs based on ASPEN/SCCM guidelines, met  MONITOR:   Vent status, Labs, Weight trends, Skin, I & O's  ASSESSMENT:   Pt with hx of HTN admitted 3/14 at [facility] with SOB. Pt with inferior STEMI and in complete heart block s/p heart cath on admission.   Pt had CABG x 4 3/22.  Vib arrest, defibrillated x 4.   Patient s/p procedure 4/5: TRACHEOSTOMY   Patient is on and off ventilator support (trach collar trial) MV: 9.2 L/min Temp (24hrs), Avg:98.6 F (37 C), Min:98 F (36.7 C), Max:99.1 F (37.3 C)   Vital High Protein formula continues to infuse at 50 ml/hr with Prostat liquid protein 30 ml TID via small bore feeding tube (tip near ligament of Treitz) providing 1500 kcals, 150 gm protein, 1003 ml of free water.  Diet Order:  Diet NPO time specified  Skin:  Reviewed, no issues  Last BM:  4/11  Height:   Ht Readings from Last 1 Encounters:  11/13/15 5' 10.5" (1.791 m)    Weight:   Wt Readings from Last 1 Encounters:  11/17/15 230 lb 13.2 oz (104.7 kg)    4/10  226 lb 4/09  233 lb 4/08  230 lb 4/07  220 lb 4/06  235 lb 4/05  236 lb 4/04  253 lb 4/03  247 lb 4/02  253 lb 4/01  250 lb  Ideal Body Weight:  75.4 kg  BMI:  Body mass index is 32.64 kg/(m^2).  Estimated Nutritional Needs:   Kcal:  5035-4656  Protein:  150-160 gm  Fluid:  per MD  EDUCATION NEEDS:   No education needs identified at this time  Arthur Holms, RD, LDN Pager #: [phone] After-Hours Pager #: [phone]

## 2015-11-18 ENCOUNTER — Inpatient Hospital Stay (HOSPITAL_COMMUNITY): Payer: BLUE CROSS/BLUE SHIELD

## 2015-11-18 LAB — BASIC METABOLIC PANEL
Anion gap: 10 (ref 5–15)
BUN: 83 mg/dL — ABNORMAL HIGH (ref 6–20)
CO2: 25 mmol/L (ref 22–32)
Calcium: 8.6 mg/dL — ABNORMAL LOW (ref 8.9–10.3)
Chloride: 111 mmol/L (ref 101–111)
Creatinine, Ser: 2.12 mg/dL — ABNORMAL HIGH (ref 0.61–1.24)
GFR calc Af Amer: 39 mL/min — ABNORMAL LOW (ref >60)
GFR calc non Af Amer: 34 mL/min — ABNORMAL LOW (ref >60)
Glucose, Bld: 104 mg/dL — ABNORMAL HIGH (ref 65–99)
Potassium: 4.1 mmol/L (ref 3.5–5.1)
Sodium: 146 mmol/L — ABNORMAL HIGH (ref 135–145)

## 2015-11-18 LAB — CBC
HCT: 32.7 % — ABNORMAL LOW (ref 39.0–52.0)
Hemoglobin: 9.6 g/dL — ABNORMAL LOW (ref 13.0–17.0)
MCH: 27.3 pg (ref 26.0–34.0)
MCHC: 29.4 g/dL — ABNORMAL LOW (ref 30.0–36.0)
MCV: 92.9 fL (ref 78.0–100.0)
Platelets: 227 10*3/uL (ref 150–400)
RBC: 3.52 MIL/uL — ABNORMAL LOW (ref 4.22–5.81)
RDW: 17.9 % — ABNORMAL HIGH (ref 11.5–15.5)
WBC: 10.2 10*3/uL (ref 4.0–10.5)

## 2015-11-18 LAB — CARBOXYHEMOGLOBIN
Carboxyhemoglobin: 1.8 % — ABNORMAL HIGH (ref 0.5–1.5)
Methemoglobin: 0.9 % (ref 0.0–1.5)
O2 Saturation: 61.9 %
Total hemoglobin: 10 g/dL — ABNORMAL LOW (ref 13.5–18.0)

## 2015-11-18 LAB — GLUCOSE, CAPILLARY
GLUCOSE-CAPILLARY: 110 mg/dL — AB (ref 65–99)
GLUCOSE-CAPILLARY: 121 mg/dL — AB (ref 65–99)
GLUCOSE-CAPILLARY: 85 mg/dL (ref 65–99)
Glucose-Capillary: 104 mg/dL — ABNORMAL HIGH (ref 65–99)
Glucose-Capillary: 88 mg/dL (ref 65–99)
Glucose-Capillary: 90 mg/dL (ref 65–99)
Glucose-Capillary: 92 mg/dL (ref 65–99)

## 2015-11-18 LAB — MAGNESIUM: Magnesium: 2.3 mg/dL (ref 1.7–2.4)

## 2015-11-18 LAB — HEPARIN LEVEL (UNFRACTIONATED): Heparin Unfractionated: <0.1 IU/mL — ABNORMAL LOW (ref 0.30–0.70)

## 2015-11-18 MED ORDER — SODIUM CHLORIDE 0.9 % IV SOLN
INTRAVENOUS | Status: DC
  Administered 2015-11-18: 15:00:00 via INTRAVENOUS
  Administered 2015-11-20: 50 mL/h via INTRAVENOUS

## 2015-11-18 MED ORDER — FREE WATER: 200.0000 mL | Status: DC

## 2015-11-18 MED ORDER — FUROSEMIDE 10 MG/ML IJ SOLN: 40.0000 mg | Freq: Every day | INTRAMUSCULAR | Status: DC

## 2015-11-18 MED ORDER — VITAL 1.5 CAL PO LIQD
1000.0000 mL | ORAL | Status: DC
  Administered 2015-11-18 – 2015-11-20 (×3): 1000 mL
  Filled 2015-11-18 (×5): qty 1000

## 2015-11-18 MED ORDER — POTASSIUM CHLORIDE 10 MEQ/50ML IV SOLN
10.0000 meq | INTRAVENOUS | Status: AC
  Administered 2015-11-18 (×2): 10 meq via INTRAVENOUS
  Filled 2015-11-18: qty 50

## 2015-11-18 MED ORDER — FUROSEMIDE 10 MG/ML IJ SOLN
40.0000 mg | Freq: Two times a day (BID) | INTRAMUSCULAR | Status: DC
  Administered 2015-11-18 – 2015-11-20 (×5): 40 mg via INTRAVENOUS
  Filled 2015-11-18 (×5): qty 4

## 2015-11-18 MED ORDER — FUROSEMIDE 10 MG/ML IJ SOLN
40.0000 mg | Freq: Once | INTRAMUSCULAR | Status: AC
  Administered 2015-11-18: 40 mg via INTRAVENOUS
  Filled 2015-11-18: qty 4

## 2015-11-18 NOTE — Progress Notes (Signed)
Per Dr. Isaiah Serge will wait to change trach. Will continue to monitor closely.

## 2015-11-18 NOTE — Progress Notes (Signed)
Speech-Language Pathology Note:  Pt being monitored as part of trach team. Per chart, he has been tolerating trach collar trials and is making attempts to communicate. Paged MD requesting order for [employer] speaking valve. MD, please order if agree.   [name], M.A. CCC-SLP [phone]

## 2015-11-18 NOTE — Progress Notes (Signed)
16 Days Post-Op Procedure(s) (LRB): Right/Left Heart Cath and Coronary/Graft Angiography (N/A) Intra-Aortic Balloon Pump Insertion Subjective: Patient in sinus rhythm Patient was on trach collar for several hours yesterday Last night had difficulty with heavy bloody secretions probably from heparin and excess free water he is receiving Will treat by stopping heparin and giving dose of IV Lasix to reduce free water  Because serum sodiumis now improved 145   Objective: Vital signs in last 24 hours: Temp:  [98.1 F (36.7 C)-99.2 F (37.3 C)] 98.7 F (37.1 C) (04/12 0739) Pulse Rate:  [72-84] 79 (04/12 0900) Cardiac Rhythm:  [-] Normal sinus rhythm (04/12 0800) Resp:  [14-30] 25 (04/12 0900) BP: (104-125)/(63-83) 125/79 mmHg (04/12 0900) SpO2:  [94 %-100 %] 100 % (04/12 0900) FiO2 (%):  [40 %] 40 % (04/12 0800) Weight:  [236 lb 8.9 oz (107.3 kg)] 236 lb 8.9 oz (107.3 kg) (04/12 0500)  Hemodynamic parameters for last 24 hours: CVP:  [7 mmHg-61 mmHg] 17 mmHg  Intake/Output from previous day: 04/11 0701 - 04/12 0700 In: 5110 [I.V.:2780; NG/GT:2330] Out: 2181 [Urine:1980; Stool:201] Intake/Output this shift: Total I/O In: 240 [I.V.:170; NG/GT:70] Out: 125 [Urine:125]  Sitting up in chair trach collar Thin serosanguineous trach secretions Sutures removed from trach appliance Abdomen soft  Lab Results:  Recent Labs  11/17/15 0416 11/18/15 0445  WBC 9.7 10.2  HGB 10.3* 9.6*  HCT 35.4* 32.7*  PLT 236 227   BMET:  Recent Labs  11/17/15 0416 11/18/15 0445  NA 155* 146*  K 4.3 4.1  CL 116* 111  CO2 27 25  GLUCOSE 107* 104*  BUN 95* 83*  CREATININE 2.34* 2.12*  CALCIUM 9.0 8.6*    PT/INR: No results for input(s): LABPROT, INR in the last 72 hours. ABG    Component Value Date/Time   PHART 7.419 11/13/2015 0526   HCO3 35.5* 11/13/2015 0526   TCO2 28 11/14/2015 1622   ACIDBASEDEF TEST WILL BE CREDITED 11/01/2015 0446   O2SAT 61.9 11/18/2015 0440   CBG (last 3)    Recent Labs  11/17/15 2353 11/18/15 0404 11/18/15 0749  GLUCAP 121* 104* 110*    Assessment/Plan: S/P Procedure(s) (LRB): Right/Left Heart Cath and Coronary/Graft Angiography (N/A) Intra-Aortic Balloon Pump Insertion Increase tube feeds Hold heparin because of persistent airway bleeding Resume daily Lasix Start by mouth Coumadinafter patient passed a swallow study   LOS: 29 days    Kathlee Nations Trigt III 11/18/2015

## 2015-11-18 NOTE — Progress Notes (Signed)
Per Dr. Donata Clay, will replace cortrack feeding tube tomorrow. For now will crush meds in frozen applesauce. Will continue to monitor closely.

## 2015-11-18 NOTE — Evaluation (Signed)
Passy-Muir Speaking Valve - Evaluation Patient Details  Name: Christopher [name] MRN: [id number] Date of Birth: September 16, 1960  Today's Date: 11/18/2015 Time: 1349-1410 SLP Time Calculation (min) (ACUTE ONLY): 21 min  Past Medical History:  Past Medical History  Diagnosis Date  . Hypertension   . TIA (transient ischemic attack)   . Stenosis of right carotid artery   . Complete heart block (HCC) 10/20/2015   Past Surgical History:  Past Surgical History  Procedure Laterality Date  . Arm surgery Right 2003  . Eye surgery    . Laceration repair    . Cardiac catheterization N/A 10/20/2015    Procedure: Left Heart Cath and Coronary Angiography;  Surgeon: Tonny Bollman, MD;  Location: [facility] INVASIVE CV LAB;  Service: Cardiovascular;  Laterality: N/A;  . Coronary artery bypass graft N/A 10/27/2015    Procedure: CORONARY ARTERY BYPASS GRAFTING (CABG) x four, using left internal mammary artery and rt leg greater saphenous vein harvested endoscopically;  Surgeon: Kerin Perna, MD;  Location: [facility] OR;  Service: Open Heart Surgery;  Laterality: N/A;  . Tee without cardioversion N/A 10/27/2015    Procedure: TRANSESOPHAGEAL ECHOCARDIOGRAM (TEE);  Surgeon: Kerin Perna, MD;  Location: [facility] OR;  Service: Open Heart Surgery;  Laterality: N/A;  . Endarterectomy Right 10/27/2015    Procedure: RIGHT ENDARTERECTOMY CAROTID with patch angioplasty using Xenosure bovine pericardium patch;  Surgeon: Fransisco Hertz, MD;  Location: [facility] OR;  Service: Vascular;  Laterality: Right;  . Cardiac catheterization N/A 11/02/2015    Procedure: Right/Left Heart Cath and Coronary/Graft Angiography;  Surgeon: Laurey Morale, MD;  Location: [facility] INVASIVE CV LAB;  Service: Cardiovascular;  Laterality: N/A;   HPI:  55 y.o. male w/ ischemic cardiomyopathy following MI s/p CABG & CEA. Post-operative course complicated by arrhythmia and biventricular failure, HCAP, heart block. PMHx HTN, stenosis of R carotid artery, TIA. Intubated 3/21-3/22,  re-intubated 3/26 and failed to wean, trach placed 4/6.   Assessment / Plan / Recommendation Clinical Impression  Pt tolerated cuff deflation with no change in vitals, with immediate cough to clear bloody secretions. Back pressure noted during initial placement of PMSV, however pt able to redirect subglottic air through upper airway with every other valve application. Pt able to achieve phonation x1 with wet vocal quality that made patency of upper airway difficult to assess. PMSV tolerated for <10 seconds at a time. Pt able to expectorate secretions at oral and tracheal level with valve placement. Recommend PMSV use with full supervision provided by SLP only at this time. Will continue to follow to provide PMSV tx and evaluate swallow function when appropriate.    SLP Assessment  Patient needs continued Speech Lanaguage Pathology Services    Follow Up Recommendations   (TBD)    Frequency and Duration min 2x/week  2 weeks    PMSV Trial PMSV was placed for: <10 seconds Able to redirect subglottic air through upper airway: Yes Able to Attain Phonation: Yes Voice Quality: Hoarse;Wet Able to Expectorate Secretions: Yes Level of Secretion Expectoration with PMSV: Tracheal;Oral Breath Support for Phonation: Moderately decreased Intelligibility: Intelligibility reduced Word: 50-74% accurate Phrase: Not tested Sentence: Not tested Conversation: Not tested Respirations During Trial:  (22-32) SpO2 During Trial:  (97-100) Pulse During Trial:  (75-85) Behavior: Alert;Controlled;Cooperative;Responsive to questions   Tracheostomy Tube       Vent Dependency  FiO2 (%): 60 % (patient desat 89-90%)    Cuff Deflation Trial  GO Tolerated Cuff Deflation: Yes Length of Time for Cuff Deflation Trial:  20 Behavior: Alert;Controlled;Listless;Responsive to questions;Poor eye contact        Lynita Lombard 11/18/2015, 2:36 PM

## 2015-11-18 NOTE — Procedures (Signed)
Pt on 60% ATC.  Vent is on standby in the room.

## 2015-11-18 NOTE — Progress Notes (Signed)
Pt requested to be placed back on vent and RT complied.

## 2015-11-18 NOTE — Progress Notes (Deleted)
Per Dr. Donata Clay pt is okay to have his pills crushed in the frozen apple juice. Will continue to monitor closely.

## 2015-11-18 NOTE — Progress Notes (Signed)
PULMONARY / CRITICAL CARE MEDICINE   Name: Christopher [name] MRN: [id number] DOB: 06-24-1961    ADMISSION DATE:  10/20/2015 CONSULTATION DATE:  11/03/2015  REFERRING MD: Dr. Donata Clay / CVTS  CHIEF COMPLAINT:  Acute Hypoxic Respiratory Failure  HISTORY OF PRESENT ILLNESS:   [name] w/ ischemic cardiomyopathy following MI. Patient underwent CABG & CEA. Post-operative course complicated by arrhythmia and biventricular failure, HCAP, heart block. Improving  SUBJECTIVE: Has bloody secretions from trach.  REVIEW OF SYSTEMS:  Feels well. Denies any nausea, vomiting No chest pain, palpitations No dyspnea, cough, wheezing.   VITAL SIGNS: BP 125/79 mmHg  Pulse 79  Temp(Src) 98.7 F (37.1 C) (Oral)  Resp 25  Ht 5' 10.5" (1.791 m)  Wt 236 lb 8.9 oz (107.3 kg)  BMI 33.45 kg/m2  SpO2 100%  HEMODYNAMICS: CVP:  [7 mmHg-61 mmHg] 17 mmHg  VENTILATOR SETTINGS: Vent Mode:  [-] PRVC FiO2 (%):  [40 %] 40 % Set Rate:  [12 bmp] 12 bmp Vt Set:  [590 mL] 590 mL PEEP:  [5 cmH20] 5 cmH20  INTAKE / OUTPUT: I/O last 3 completed shifts: In: 7170 [P.O.:20; I.V.:3500; NG/GT:3650] Out: 3196 [Urine:2895; Stool:301]  PHYSICAL EXAMINATION: General:  Comfortable. Sitting up in chair Neuro:  Movers all 4 extremities. Grossly nonfocal. HEENT: Trach in place. No JVD, thyomegaly Cardiovascular:  Regular rate. No MRG Lungs:  Clear, no wheezing, crackles Abdomen:  Soft. Protuberant. Normal BS. Nontender. Integument:  No rash  LABS:  BMET  Recent Labs Lab 11/16/15 1255 11/17/15 0416 11/18/15 0445  NA 154* 155* 146*  K 3.8 4.3 4.1  CL 113* 116* 111  CO2 BUN 102* 95* 83*  CREATININE 2.41* 2.34* 2.12*  GLUCOSE 129* 107* 104*   Electrolytes  Recent Labs Lab 11/12/15 0400 11/13/15 0430  11/16/15 0400 11/16/15 1255 11/17/15 0416 11/18/15 0445  CALCIUM 9.5 9.3  < > 9.1 8.9 9.0 8.6*  MG 2.5* 2.6*  < > 2.6*  --  2.6* 2.3  PHOS 4.3 5.8*  --   --   --   --   --   < > =  values in this interval not displayed. CBC  Recent Labs Lab 11/16/15 0400 11/17/15 0416 11/18/15 0445  WBC 12.2* 9.7 10.2  HGB 10.4* 10.3* 9.6*  HCT 35.8* 35.4* 32.7*  PLT 265 236 227   Coag's  Recent Labs Lab 11/11/15 0940  APTT 29  INR 1.44    Sepsis Markers No results for input(s): LATICACIDVEN, PROCALCITON, O2SATVEN in the last 168 hours. ABG  Recent Labs Lab 11/12/15 0451 11/12/15 0457 11/13/15 0526  PHART 7.615* 7.611* 7.419  PCO2ART 37.9 37.9 55.2*  PO2ART 74.0* 74.0* 68.0*    Liver Enzymes  Recent Labs Lab 11/12/15 0400 11/13/15 0430 11/17/15 0416  AST  --   --  50*  ALT  --   --  51  ALKPHOS  --   --  66  BILITOT  --   --  1.4*  ALBUMIN 2.5* 2.6* 2.6*    Cardiac Enzymes No results for input(s): TROPONINI, PROBNP in the last 168 hours.  Glucose  Recent Labs Lab 11/17/15 1135 11/17/15 1612 11/17/15 1911 11/17/15 2353 11/18/15 0404 11/18/15 0749  GLUCAP 119* 79 95 121* 104* 110*   Imaging Dg Chest Port 1 View  11/18/2015  CLINICAL DATA:  Respiratory failure. EXAM: PORTABLE CHEST 1 VIEW COMPARISON:  11/17/2015. FINDINGS: Tracheostomy tube, feeding tube, right PICC line, left subclavian line in stable position. Prior  CABG. Stable cardiomegaly. Persistent left lower lobe infiltrate/edema again noted. Right base atelectasis and/or infiltrate/edema noted. Small left pleural effusion . These changes may be related to bibasilar pneumonia and/or congestive heart failure . The thorax. IMPRESSION: 1. Lines and tubes in stable position . 2.  Prior CABG.  Persistent cardiomegaly. 3. Persistent left lower lobe infiltrate and small left pleural effusion. Developing right lower lobe infiltrate. These changes may be related congestive heart failure with pulmonary edema. Bilateral pneumonia could also present in this fashion. Electronically Signed   By: Maisie Fus  Register   On: 11/18/2015 07:41    STUDIES:  CXR 4/11> images reviewed. No change from prior. Has  LLL opacity, fluid.   MICROBIOLOGY: Tracheal Asp Ctx 3/27:  Oral Flora Urine Ctx 3/24:  Negative MRSA PCR 3/20:  Negative MRSA PCR 3/14:  Negative C diff 4/10 > Negative  ANTIBIOTICS: Zosyn 3/29 - 4/1 Vancomcyin 3/21 - 3/22; 3/29 - 4/7 Cefuroxime 3/21 - 3/23 (periop prophylaxis) Elita Quick 3/27 - 3/29 Levaquin 4/1 - 4/7 Flagyl 4/9 >> 4/11  SIGNIFICANT EVENTS: 3/14 - Admit 3/14 - LHC 3/21 - CABG 3/27 - IABP replaced >> 4/1 3/28 - Chest Tubes removed. RHC & LHC.  LINES/TUBES: Trach 6.0 (JY) 4/5 >>  [place] CVL 3/28>>> R PICC 3/23>>> R NJT>>> Foley>>> IABP 3/27 - 4/1 OETT 7.5 Extubated 3/22; 3.24 - 4/5  ASSESSMENT / PLAN:  PULMONARY A: Acute Hypoxic Respiratory Failure - Improving. Chronic Hypercarbic Respiratory Failure - OSA/OHS, COPD. Chronic hypoxemic resp failure - cardiogenic pulmonary edema  P:   Continue trach collar trial. Place back on vent at night. Start Pasir- muir valve trials. On Xopenex q6hr.  CARDIOVASCULAR A:  S/P CABG & CEA Left Ventricular Heart Failure post MI Cardiogenic Shock Periop Complete Heart Block & Post-op V Tach H/O HTN  P:  Management per CVTS & Cardiology. ASA VT Lipitor VT Amiodarone gtt. Changed to PO Heparin gtt > To be stopped today because of bloody secretions. Revatio VT tid Lasix IV on hold  RENAL A:   Acute Renal Failure - Resolving. Hypoklalemia - Resolved. Hypernatremia - Worsening P:   Trending UOP with Foley Electrolytes & Renal Function daily Free water. D5W at 150/hr for hypernatremia  GASTROINTESTINAL A:   Transaminitis - Likely shock liver/hepatic congestion. Ileus  P:   Tolerating tube feeds. Ileus is improving  HEMATOLOGIC A:   Anemia - Hgb stable.  Leukocytosis - Mild.  P:  Transfusion PRBC as needed Daily CBC  INFECTIOUS A:   Possible HCAP - Ctx w/ Oral Flora.  P:   Completed 10 day course of antibiotics. Off flagyl as C.diff is negative.  Recheck sputum  cultures  ENDOCRINE CBG (last 3)  A:   Hyperglycemia - BG controlled.  P:   Continue Levemir bid Accu-checks q4hr SSI per algorithm  NEUROLOGIC A:   Sedation on Ventilator Post-op Pain Control   P:   RASS goal: 0 Versed IV prn Fentanyl IV prn Seroquel qhs  FAMILY UPDATES: Pt and significant other updated at bedside 4/11  TODAY'S SUMMARY:  55 year old male s/p CABG & Right CEA with complicated post-op course. Patient's renal function slowly improving. Tolerating tracheostomy collar trials. Ileus  Improving Tolerating tube feeds.  Critical care time- 35 mins.  Chilton Greathouse MD [facility] Pager [phone] If no answer or after 3pm call: [phone] 11/18/2015, 9:30 AM

## 2015-11-18 NOTE — Progress Notes (Signed)
PT Cancellation Note  Patient Details Name: Christopher [name] MRN: [id number] DOB: 01-May-1961   Cancelled Treatment:    Reason Eval/Treat Not Completed: Fatigue/lethargy limiting ability to participate.  Spoke with RN who indicates plans to try to take pt outside today and pt is currently resting.  Will f/u tomorrow.     Sunny Schlein, [place] [phone] 11/18/2015, 1:20 PM

## 2015-11-18 NOTE — Progress Notes (Signed)
16 Days Post-Op Procedure(s) (LRB): Right/Left Heart Cath and Coronary/Graft Angiography (N/A) Intra-Aortic Balloon Pump Insertion Subjective: Spending more time on trach collar Attempted swallow study and successful today Airway secretions remained fairly heavy but now less bloody since heparin has been stopped Maintaining sinus rhythm Had large bowel movement and feeding tube was pulled out today-plan on replacing tomorrow Consult to inpatient rehabilitation has been placed  Objective: Vital signs in last 24 hours: Temp:  [98.2 F (36.8 C)-99.2 F (37.3 C)] 98.7 F (37.1 C) (04/12 1500) Pulse Rate:  [72-83] 74 (04/12 1800) Cardiac Rhythm:  [-] Normal sinus rhythm (04/12 1500) Resp:  [14-28] 17 (04/12 1800) BP: (101-141)/(62-86) 123/83 mmHg (04/12 1800) SpO2:  [92 %-100 %] 96 % (04/12 1800) FiO2 (%):  [40 %-60 %] 60 % (04/12 1814) Weight:  [236 lb 8.9 oz (107.3 kg)] 236 lb 8.9 oz (107.3 kg) (04/12 0500)  Hemodynamic parameters for last 24 hours: CVP:  [13 mmHg-61 mmHg] 21 mmHg  Intake/Output from previous day: 04/11 0701 - 04/12 0700 In: 5110 [I.V.:2780; NG/GT:2330] Out: 2181 [Urine:1980; Stool:201] Intake/Output this shift: Total I/O In: 1394.2 [I.V.:1003.3; NG/GT:290.8; IV Piggyback:100] Out: 1085 [Urine:1085]  Exam Alert on trach collar Course breath sounds Abdomen soft Extremities warm well perfused  Lab Results:  Recent Labs  11/17/15 0416 11/18/15 0445  WBC 9.7 10.2  HGB 10.3* 9.6*  HCT 35.4* 32.7*  PLT 236 227   BMET:  Recent Labs  11/17/15 0416 11/18/15 0445  NA 155* 146*  K 4.3 4.1  CL 116* 111  CO2 27 25  GLUCOSE 107* 104*  BUN 95* 83*  CREATININE 2.34* 2.12*  CALCIUM 9.0 8.6*    PT/INR: No results for input(s): LABPROT, INR in the last 72 hours. ABG    Component Value Date/Time   PHART 7.419 11/13/2015 0526   HCO3 35.5* 11/13/2015 0526   TCO2 28 11/14/2015 1622   ACIDBASEDEF TEST WILL BE CREDITED 11/01/2015 0446   O2SAT 61.9  11/18/2015 0440   CBG (last 3)   Recent Labs  11/18/15 0404 11/18/15 0749 11/18/15 1143  GLUCAP 104* 110* 88    Assessment/Plan: S/P Procedure(s) (LRB): Right/Left Heart Cath and Coronary/Graft Angiography (N/A) Intra-Aortic Balloon Pump Insertion Plan C IR consult Plan feeding tube replacement tomorrow since he did not pass swallow study   LOS: 29 days    Kathlee Nations Trigt III 11/18/2015

## 2015-11-18 NOTE — Progress Notes (Signed)
Patient ID: Christopher [name], male   DOB: 1960-12-13, 55 y.o.   MRN: [id number]   ADVANCED HF ROUNDING NOTE  Subjective:  Sitting up in chair today on trach vent.  Still oozing from trach slightly  Has converted to NSR on amio.   Got D5W for hypernatremia yesterday. Serum sodium down slightly. CVP still up (12-14). Getting daily IV lasix  Ileus improving. Starting to ambulate in halls.     Scheduled Meds: . amiodarone  150 mg Intravenous Once  . amiodarone  200 mg Oral BID  . antiseptic oral rinse  7 mL Mouth Rinse QID  . aspirin  81 mg Oral Daily  . atorvastatin  80 mg Oral q1800  . chlorhexidine gluconate (SAGE KIT)  15 mL Mouth Rinse BID  . feeding supplement (PRO-STAT SUGAR FREE 64)  30 mL Per Tube TID  . free water  400 mL Per Tube Q3H  . hydrALAZINE  25 mg Oral 3 times per day  . insulin aspart  0-24 Units Subcutaneous 6 times per day  . insulin detemir  12 Units Subcutaneous BID  . levalbuterol  1.25 mg Nebulization TID  . pantoprazole sodium  40 mg Per Tube Daily  . potassium chloride  40 mEq Oral 3 times per day  . QUEtiapine  50 mg Oral QHS  . sildenafil  40 mg Oral TID  . silver sulfADIAZINE   Topical BID  . sodium chloride flush  10-40 mL Intracatheter Q12H  . triamcinolone cream   Topical BID   Continuous Infusions: . sodium chloride Stopped (11/15/15 0600)  . amiodarone Stopped (11/16/15 1223)  . dextrose 150 mL/hr at 11/18/15 0600  . feeding supplement (VITAL HIGH PROTEIN) 1,000 mL (11/18/15 0600)  . heparin 1,000 Units/hr (11/18/15 0600)   PRN Meds:.diphenhydrAMINE, fentaNYL (SUBLIMAZE) injection, levalbuterol, midazolam, ondansetron (ZOFRAN) IV, sodium chloride flush, traMADol    Filed Vitals:   11/18/15 0600 11/18/15 0700 11/18/15 0714 11/18/15 0739  BP: 116/78 119/80 119/80 119/80  Pulse: 72 73 73 75  Temp:    98.7 F (37.1 C)  TempSrc:    Oral  Resp: 15 14 22 22   Height:      Weight:      SpO2: 96% 98% 98% 96%    Intake/Output Summary  (Last 24 hours) at 11/18/15 0848 Last data filed at 11/18/15 0600  Gross per 24 hour  Intake   5100 ml  Output   2081 ml  Net   3019 ml    LABS: Basic Metabolic Panel:  Recent Labs  11/17/15 0416 11/18/15 0445  NA 155* 146*  K 4.3 4.1  CL 116* 111  CO2 27 25  GLUCOSE 107* 104*  BUN 95* 83*  CREATININE 2.34* 2.12*  CALCIUM 9.0 8.6*  MG 2.6* 2.3   Liver Function Tests:  Recent Labs  11/17/15 0416  AST 50*  ALT 51  ALKPHOS 66  BILITOT 1.4*  PROT 6.5  ALBUMIN 2.6*    Recent Labs  11/17/15 0416  AMYLASE 72   CBC:  Recent Labs  11/17/15 0416 11/18/15 0445  WBC 9.7 10.2  HGB 10.3* 9.6*  HCT 35.4* 32.7*  MCV 94.4 92.9  PLT 236 227   Cardiac Enzymes: No results for input(s): CKTOTAL, CKMB, CKMBINDEX, TROPONINI in the last 72 hours. BNP: Invalid input(s): POCBNP D-Dimer: No results for input(s): DDIMER in the last 72 hours. Hemoglobin A1C: No results for input(s): HGBA1C in the last 72 hours. Fasting Lipid Panel: No results for input(s): CHOL,  HDL, LDLCALC, TRIG, CHOLHDL, LDLDIRECT in the last 72 hours. Thyroid Function Tests: No results for input(s): TSH, T4TOTAL, T3FREE, THYROIDAB in the last 72 hours.  Invalid input(s): FREET3 Anemia Panel: No results for input(s): VITAMINB12, FOLATE, FERRITIN, TIBC, IRON, RETICCTPCT in the last 72 hours.  RADIOLOGY: Dg Chest 2 View  10/23/2015  CLINICAL DATA:  CHF.  Weakness. EXAM: CHEST  2 VIEW COMPARISON:  10/21/2015 FINDINGS: Multiple interstitial densities in the lower chest are suggestive for interstitial edema, right side greater than left. Heart size is upper limits of normal. Trachea is midline. Negative for a pneumothorax. Evidence for a small right pleural effusion. IMPRESSION: Interstitial densities in the lower chest, right side greater than left. Findings are suggestive for interstitial pulmonary edema. Difficult to exclude some airspace disease in the right lower lung. Evidence for small right pleural  effusion. Electronically Signed   By: Markus [name] M.D.   On: 10/23/2015 08:27   Dg Abd 1 View  11/03/2015  CLINICAL DATA:  Enteric tube advanced to post pyloric position. EXAM: ABDOMEN - 1 VIEW COMPARISON:  11/02/2015 abdominal radiographs. FINDINGS: Fluoroscopy time 7 minutes. Enteric tube courses through the stomach and duodenum and appears to terminate near the duodenal jejunal junction. Instilled contrast opacifies nondilated proximal jejunal small bowel loops. No contrast leak is demonstrated. Separate enteric tube terminates in the body of the stomach. Visualized lower sternotomy wires appear aligned and intact. IMPRESSION: Enteric tube appears to terminate near the duodenal jejunal junction. Separate enteric tube terminates in the body of the stomach. Electronically Signed   By: Ilona Sorrel M.D.   On: 11/03/2015 17:02   Dg Chest Port 1 View  11/18/2015  CLINICAL DATA:  Respiratory failure. EXAM: PORTABLE CHEST 1 VIEW COMPARISON:  11/17/2015. FINDINGS: Tracheostomy tube, feeding tube, right PICC line, left subclavian line in stable position. Prior CABG. Stable cardiomegaly. Persistent left lower lobe infiltrate/edema again noted. Right base atelectasis and/or infiltrate/edema noted. Small left pleural effusion . These changes may be related to bibasilar pneumonia and/or congestive heart failure . The thorax. IMPRESSION: 1. Lines and tubes in stable position . 2.  Prior CABG.  Persistent cardiomegaly. 3. Persistent left lower lobe infiltrate and small left pleural effusion. Developing right lower lobe infiltrate. These changes may be related congestive heart failure with pulmonary edema. Bilateral pneumonia could also present in this fashion. Electronically Signed   By: Marcello Moores  Register   On: 11/18/2015 07:41   Dg Chest Port 1 View  11/17/2015  CLINICAL DATA:  Shortness of breath. EXAM: PORTABLE CHEST 1 VIEW COMPARISON:  11/16/2015. FINDINGS: Tracheostomy tube, feeding tube, right PICC line, left  subclavian central line in stable position. Prior CABG. Cardiomegaly. Left lower lobe infiltrate a small left pleural effusion. Mild right base infiltrate. No pneumothorax. IMPRESSION: 1.  Lines and tubes in stable position. 2. Persistent left lower lobe infiltrate and left pleural effusion. No interim change. Mild right lower lobe infiltrate. No change from prior exam . 3.  Stable cardiomegaly. Electronically Signed   By: Marcello Moores  Register   On: 11/17/2015 07:19   Dg Chest Port 1 View  11/16/2015  CLINICAL DATA:  Follow-up respiratory failure EXAM: PORTABLE CHEST 1 VIEW COMPARISON:  11/15/2014 FINDINGS: Prior CABG. Tracheostomy tube, right PICC line and left subclavian central line remain in place, unchanged. There is cardiomegaly with vascular congestion. Continued left lower lobe atelectasis or pneumonia and suspect small left pleural effusion. No significant change since prior study. No confluent opacity on the right. IMPRESSION: Stable left lower  lobe atelectasis or pneumonia with small left effusion. Stable cardiomegaly with vascular congestion. Electronically Signed   By: Rolm Baptise M.D.   On: 11/16/2015 07:27   Dg Chest Port 1 View  11/15/2015  CLINICAL DATA:  Chronic ventilator dependent respiratory failure. CABG and right carotid endarterectomy 10/27/2015. EXAM: PORTABLE CHEST 1 VIEW COMPARISON:  11/14/2015 and earlier. FINDINGS: Tracheostomy tube tip in satisfactory position below the thoracic inlet. Left subclavian central venous catheter tip projects over the mid SVC, unchanged. Right arm PICC tip projects over lower SVC at or near the cavoatrial junction, unchanged. Feeding tube courses below the diaphragm into the stomach. Sternotomy for CABG. Cardiac silhouette moderately enlarged. Mild pulmonary venous hypertension without overt edema, unchanged. Dense consolidation in the left lower lobe with air bronchograms, unchanged. Moderate-sized left pleural effusion, unchanged. No new pulmonary  parenchymal abnormalities. IMPRESSION: 1.  Support apparatus satisfactory. 2. Stable dense left lower lobe atelectasis and/or pneumonia and left pleural effusion. 3. Stable cardiomegaly and pulmonary venous hypertension without overt edema. 4. No new abnormalities. Electronically Signed   By: Evangeline Dakin M.D.   On: 11/15/2015 07:22   Dg Chest Port 1 View  11/14/2015  CLINICAL DATA:  55 year old male with a history of shortness of breath. Patient admission 10/20/2015. Right carotid endarterectomy 10/27/2015, with same-day coronary artery bypass grafting 10/27/2015. Twelve days postop now with history of intra-aortic balloon pump. EXAM: PORTABLE CHEST 1 VIEW COMPARISON:  11/13/2015, 11/12/2015, 11/11/2015 FINDINGS: Cardiomediastinal silhouette likely unchanged, with the left heart border partially obscured by overlying lung and pleural disease. Low lung volumes persist with bibasilar opacities and obscuration of the retrocardiac region. Obscuration of left hemidiaphragm. Unchanged tracheostomy tube, enteric feeding tube terminating out of the field of view, right upper extremity PICC and left subclavian central line. IMPRESSION: Similar appearance of the chest x-ray, with persisting bibasilar airspace opacities and left pleural effusion. Unchanged support apparatus as above. Signed, Dulcy Fanny. Earleen Newport, DO [facility] Electronically Signed   By: Corrie Mckusick D.O.   On: 11/14/2015 08:48   Dg Chest Port 1 View  11/13/2015  CLINICAL DATA:  Shortness of breath. EXAM: PORTABLE CHEST 1 VIEW COMPARISON:  November 12, 2015. FINDINGS: Stable cardiomegaly. Status post coronary bypass graft. Tracheostomy and feeding tube are unchanged. Bilateral subclavian catheters are unchanged. No pneumothorax is noted. Stable bibasilar opacities are noted with left greater than right. Stable left pleural effusion is noted. Bony thorax is unremarkable. IMPRESSION: Stable support  apparatus. Stable bibasilar opacities. Stable left pleural effusion. Electronically Signed   By: Marijo Conception, M.D.   On: 11/13/2015 07:37   Dg Chest Port 1 View  11/12/2015  CLINICAL DATA:  Respiratory failure. EXAM: PORTABLE CHEST 1 VIEW COMPARISON:  11/11/2015. FINDINGS: Tracheostomy tube, feeding tube, left IJ line, right PICC line in stable position . Prior CABG. Cardiomegaly with diffuse bilateral pulmonary infiltrates consistent pulmonary edema. Progressed from prior exam. Small bilateral pleural effusions. No pneumothorax. IMPRESSION: 1. Lines and tubes in stable position. 2. Prior CABG. Cardiomegaly with progressive bilateral pulmonary infiltrates consistent with pulmonary edema. Small bilateral pleural effusions are noted. Electronically Signed   By: Marcello Moores  Register   On: 11/12/2015 07:46   Dg Chest Port 1 View  11/11/2015  CLINICAL DATA:  Tracheostomy placement EXAM: PORTABLE CHEST 1 VIEW COMPARISON:  11/11/2015 FINDINGS: Tracheostomy tube has been placed with the tip projecting over the mid trachea. No pneumothorax. Right PICC line remains in place, unchanged as does left central line. Cardiomegaly with  bilateral perihilar and lower lobe opacities, likely edema. Suspect layering effusions. IMPRESSION: Interval placement tracheostomy tube without pneumothorax. Suspected CHF. Bilateral effusions. Findings similar to prior study. Electronically Signed   By: Rolm Baptise M.D.   On: 11/11/2015 14:50   Dg Chest Port 1 View  11/11/2015  CLINICAL DATA:  Respiratory failure. EXAM: PORTABLE CHEST 1 VIEW COMPARISON:  11/10/2015. FINDINGS: Endotracheal tube, feeding tube, bilateral subclavian lines in stable position. Prior CABG. Cardiomegaly with diffuse bilateral pulmonary infiltrates consistent with pulmonary edema. Worsening from prior exam. Small left pleural effusion. No pneumothorax. IMPRESSION: 1.  Lines and tubes in stable position. 2. Prior CABG. Cardiomegaly with worsening bilateral pulmonary  edema. Small left pleural effusion. Electronically Signed   By: Marcello Moores  Register   On: 11/11/2015 07:17   Dg Chest Port 1 View  11/10/2015  CLINICAL DATA:  Shortness of breath . EXAM: PORTABLE CHEST 1 VIEW COMPARISON:  11/09/2015. FINDINGS: Endotracheal tube, NG tube, right PICC line, left subclavian line in stable position. Prior CABG. Cardiomegaly. Left lower lobe atelectasis and/or infiltrate with small left pleural effusion. Mild right base atelectasis and or infiltrate. No pneumothorax . IMPRESSION: 1.  Lines and tubes in stable position. 2. Left lower lobe atelectasis and/or infiltrate with small left pleural effusion. Mild right base atelectasis and/or infiltrate. Findings stable from prior exam. 3.  Prior CABG.  Stable cardiomegaly. Electronically Signed   By: Marcello Moores  Register   On: 11/10/2015 07:51   Dg Chest Port 1 View  11/09/2015  CLINICAL DATA:  Atelectasis. EXAM: PORTABLE CHEST 1 VIEW COMPARISON:  11/08/2015 FINDINGS: Endotracheal tube in satisfactory position. Right arm PICC tip in the lower SVC unchanged. Left subclavian central venous catheter in the SVC. NG tube and feeding tube enters the stomach with the tips not visualized. Negative for pneumothorax. Left lower lobe airspace disease unchanged. Small left effusion unchanged. Mild pulmonary vascular congestion unchanged. IMPRESSION: Support lines remain in satisfactory position and unchanged Left lower lobe atelectasis/ infiltrate unchanged. Mild right lower lobe airspace consolidation stable. Mild vascular congestion unchanged. Electronically Signed   By: Franchot Gallo M.D.   On: 11/09/2015 07:36   Dg Chest Port 1 View  11/08/2015  CLINICAL DATA:  Respiratory failure, hypertension, coronary artery disease post MI and CABG EXAM: PORTABLE CHEST 1 VIEW COMPARISON:  Portable exam 1216 hours compared to 11/06/2015 FINDINGS: Tip of endotracheal tube projects 6.8 cm above carina. Nasogastric tube and feeding tube extends into stomach. LEFT  subclavian central venous catheter with tip projecting over SVC. RIGHT arm PICC line tip projects over SVC. Enlargement of cardiac silhouette post CABG. Stable mediastinal contours. Increased LEFT lower lobe consolidation. Slightly increased infiltrate at RIGHT base as well. Minimal central peribronchial thickening. No pleural effusion or pneumothorax. IMPRESSION: Enlargement of cardiac silhouette post CABG. Stable line and tube positions. Increased LEFT lower lobe consolidation with minimally increased infiltrate at RIGHT base as well. Findings are suspicious for pneumonia involving the LEFT lower lobe though a component of coexisting pulmonary edema may be present. Electronically Signed   By: Lavonia Dana M.D.   On: 11/08/2015 12:47   Dg Chest Port 1 View  11/06/2015  CLINICAL DATA:  Acute CHF secondary to acute MI, complete heart block. EXAM: PORTABLE CHEST 1 VIEW COMPARISON:  Portable chest x-ray of November 05, 2015 FINDINGS: The lungs are adequately inflated. The pulmonary interstitial markings are less prominent on the right today. They have improved on the left as well and the hemidiaphragm is now visible. There is persistent interstitial  edema on the left. The cardiac silhouette remains enlarged. The intraaortic balloon pump marker tip projects over the proximal portion of descending thoracic aorta and appears stable. The endotracheal tube tip projects 5.5 cm above the carina. The nasogastric and Doppler AH feeding tubes have their tips below the inferior margin of the image. The right-sided PICC line tip projects over the junction of the middle and distal thirds of the SVC. The left subclavian venous catheter tip projects over the midportion of the SVC. IMPRESSION: Decreasing pulmonary edema consistent with improvement in CHF. Persistent perihilar interstitial edema on the left. The support tubes and devices are in reasonable position. Electronically Signed   By: David  Martinique M.D.   On: 11/06/2015 07:32    Dg Chest Port 1 View  11/05/2015  CLINICAL DATA:  Respiratory failure. EXAM: PORTABLE CHEST 1 VIEW COMPARISON:  11/04/2015 . FINDINGS: Endotracheal tube, NG tube, right PICC line stable position. Prior CABG. Cardiomegaly. Persistent left mid and lower lung field infiltrate. Persistent mild atelectatic changes right upper and right lower lobes. No pneumothorax. IMPRESSION: 1.  Lines and tubes in stable position. 2. Persistent left mid and left lower lung infiltrate, no interim change. Persistent right upper lobe and right lower lobe subsegmental atelectasis. No interim change. 3. Prior CABG.  Stable cardiomegaly. Electronically Signed   By: Marcello Moores  Register   On: 11/05/2015 07:48   Dg Chest Port 1 View  11/04/2015  CLINICAL DATA:  Hypoxia EXAM: PORTABLE CHEST 1 VIEW COMPARISON:  November 04, 2015 FINDINGS: Endotracheal tube tip is 5.3 cm above the carina. Central catheter tip is in the superior vena cava near the cavoatrial junction. Nasogastric tube tip and side port are below the diaphragm. There also is a feeding tube with the tip below the diaphragm. No pneumothorax. There has been removal of a left chest tube. There is airspace consolidation throughout the left mid and lower lung zones, stable. There is mild atelectasis in the right upper lobe, stable. No new opacity is evident. There is stable cardiomegaly. The pulmonary vascularity is normal. No adenopathy is evident. IMPRESSION: Tube and catheter positions as described without pneumothorax. Airspace consolidation throughout the left mid and lower lung zones, stable. Atelectasis right upper lobe, stable. No new opacity. Stable cardiomegaly. Electronically Signed   By: Lowella Grip III M.D.   On: 11/04/2015 14:52   Dg Chest Port 1 View  11/04/2015  CLINICAL DATA:  Status post CABG 10/28/2015. EXAM: PORTABLE CHEST 1 VIEW COMPARISON:  Single view of the chest 11/03/2015 and 11/02/2015. FINDINGS: Support tubes and lines including a left chest tube are  unchanged since the most recent exam. The chest is better expanded today with decreased atelectasis. Airspace opacity in the left mid and lower lung zones persists. There is likely a left pleural effusion. Marked cardiomegaly is seen with only mild vascular congestion noted. IMPRESSION: Increased pulmonary expansion with decreased scattered atelectasis. Left mid and lower lung zone airspace disease could be due to atelectasis or pneumonia. Small left pleural effusion noted. Support tubes and lines projecting good position.  No pneumothorax. Electronically Signed   By: Inge Rise M.D.   On: 11/04/2015 07:30   Dg Chest Port 1 View  11/03/2015  CLINICAL DATA:  Central line placement. EXAM: PORTABLE CHEST 1 VIEW COMPARISON:  11/03/2015. FINDINGS: Left subclavian central line noted with tip at the cavoatrial junction. Interval removal of right chest tube. Endotracheal tube, NG tube, right PICC line, left IJ sheath left chest tube in stable position. No pneumothorax.  Prior CABG. Stable cardiomegaly. Low lung volumes. A mild component of congestive heart failure cannot be excluded. Similar findings noted on prior exam P IMPRESSION: 1. Interim placement of left subclavian central line, its tip is at the cavoatrial junction. Interim removal right chest tube . No pneumothorax. 2. Remaining lines and tubes including left chest tube in stable position. 3. Prior CABG. Stable cardiomegaly. Low lung volumes. A a mild component congestive heart failure cannot be excluded . Similar findings noted on prior exam. Electronically Signed   By: [place]   On: 11/03/2015 09:58   Dg Chest Port 1 View  11/03/2015  CLINICAL DATA:  Status post CABG on October 27, 2015 persistent left lower lobe atelectasis or pneumonia. No pneumothorax or significant pleural effusion. EXAM: PORTABLE CHEST 1 VIEW COMPARISON:  Portable chest x-ray of November 02, 2015 at 5:48 a.m. FINDINGS: There has been interval placement of an intra aortic  balloon pump whose marker lies over the junction of the aortic arch with the proximal descending thoracic aorta. The cardiac silhouette remains enlarged. The pulmonary vascularity remains mildly prominent. The retrocardiac region on the left remains dense. Bilateral chest tubes are in stable position. There is no pneumothorax nor significant pleural effusion. The endotracheal tube tip lies approximately 6.5 cm above the carina. The esophagogastric tube tip projects below the inferior margin of the image. The right-sided PICC line tip projects over the midportion of the SVC. A previously demonstrated pericardial drain is not clearly evident on today's study. IMPRESSION: Mild CHF. Persistent left lower lobe atelectasis or pneumonia. Interval placement of an intra aortic balloon pump which appears to be in appropriate position radiographically. The other support tubes and lines are in reasonable position. Electronically Signed   By: David  Martinique M.D.   On: 11/03/2015 07:27   Dg Chest Port 1 View  11/02/2015  CLINICAL DATA:  CABG. EXAM: PORTABLE CHEST 1 VIEW COMPARISON:  11/01/2015. FINDINGS: Endotracheal tube, NG tube, right PICC line, bilateral chest tubes in stable position. Prior CABG. Stable cardiomegaly . Persistent mild interstitial prominence, congestive heart failure cannot be excluded. No interim change. Low lung volumes with basilar atelectasis . IMPRESSION: 1. Endotracheal tube, NG tube, right PICC line, bilateral chest tubes in stable position . No pneumothorax. 2. Prior CABG. Cardiomegaly with pulmonary interstitial prominence and small left pleural effusion consistent with mild congestive heart failure. No interim change from prior exam . Electronically Signed   By: Marcello Moores  Register   On: 11/02/2015 07:20   Dg Chest Port 1 View  11/01/2015  CLINICAL DATA:  Post CABG EXAM: PORTABLE CHEST 1 VIEW COMPARISON:  10/31/2015 FINDINGS: Cardiomediastinal silhouette is stable. Status post CABG. Stable  endotracheal and NG tube position. Left chest tube is unchanged in position. Persistent central vascular congestion and mild interstitial prominence bilaterally suspicious for the mild interstitial edema. Left IJ sheath in place. Probable small left pleural effusion left basilar atelectasis or infiltrate. Right arm PICC line is unchanged in position. There is no pneumothorax. IMPRESSION: Status post CABG. Stable support apparatus. Stable left chest tube position. No pneumothorax. Again noted bilateral mild interstitial prominence and perihilar opacities suspicious for mild pulmonary edema. Probable small left pleural effusion left basilar atelectasis or infiltrate. Electronically Signed   By: Lahoma Crocker M.D.   On: 11/01/2015 09:57   Dg Chest Port 1 View  10/31/2015  CLINICAL DATA:  55 year old male with a history of endotracheal tube placement Patient has undergone coronary artery bypass grafting x4 10/27/2015. Placement of intra aortic  balloon pump at this date. Ischemic cardiomyopathy. EXAM: PORTABLE CHEST 1 VIEW COMPARISON:  10/30/2015. FINDINGS: Endotracheal tube terminates approximately 3.4 cm above the carina, unchanged. Gastric tube terminates out of the field of view. Defibrillator pads project over the left and right chest. Bilateral thoracostomy tubes. Right upper extremity PICC. The mediastinal drain not visualized. Intra-aortic balloon pump not visualized. Surgical changes of median sternotomy and CABG. Mixed bilateral interstitial and airspace opacities. Low lung volumes. IMPRESSION: Low lung volumes with mixed bilateral interstitial and airspace opacities, similar to the comparison chest x-ray. Retrocardiac opacity may reflect persistent pleural fluid, and/or atelectasis. Surgical support apparatus appear unchanged, as above. Intra aortic balloon pump not visualized. Signed, Dulcy Fanny. Earleen Newport, DO [facility] Electronically Signed   By: Corrie Mckusick D.O.   On: 10/31/2015 07:25   Dg Chest Port 1 View  10/30/2015  CLINICAL DATA:  Post cavity.  Recent CABG. EXAM: PORTABLE CHEST 1 VIEW COMPARISON:  Portable film earlier today. FINDINGS: Overlying support apparatus redemonstrated. Tubes and lines remain stable. Elevated LEFT hemidiaphragm. No definite pneumothorax. Moderate vascular congestion may be increased. Cardiomegaly. IMPRESSION: Slight worsening aeration. Moderate vascular congestion may be increased. Electronically Signed   By: Staci Righter M.D.   On: 10/30/2015 18:39   Dg Chest Port 1 View  10/30/2015  CLINICAL DATA:  Status post coronary bypass grafting EXAM: PORTABLE CHEST 1 VIEW COMPARISON:  10/30/2015 FINDINGS: Cardiac shadow remains enlarged. Bilateral chest tubes are again seen. A right-sided PICC line and endotracheal tube are noted in satisfactory position. Left jugular sheath is noted. Elevation of left hemidiaphragm is noted with left basilar atelectasis. No pneumothorax is noted. IMPRESSION: Postsurgical change with tubes and lines as described. Mild left basilar atelectasis. Electronically Signed   By: Inez Catalina M.D.   On: 10/30/2015 07:45   Dg Chest Port 1 View  10/30/2015  CLINICAL DATA:  A chest tube in place EXAM: PORTABLE CHEST 1 VIEW COMPARISON:  10/30/2015 FINDINGS: Postoperative changes in the mediastinum. Endotracheal tube with tip measuring 6.3 cm about the carina. Right PICC catheter with tip over the cavoatrial junction. Left central venous catheter sheath with tip over the left neck consistent location in the internal jugular vein. Bilateral chest tubes. Infiltration or atelectasis in the right lung base. Cardiac enlargement. No significant vascular congestion. No pneumothorax. IMPRESSION: Appliance positioned as described. Cardiac enlargement with infiltration or atelectasis in the right lung base. Bilateral chest tubes are present but no visualized pneumothorax. Electronically Signed   By: Lucienne Capers  M.D.   On: 10/30/2015 03:07   Dg Chest Port 1 View  10/30/2015  CLINICAL DATA:  Shortness of breath.  Code blue. EXAM: PORTABLE CHEST 1 VIEW COMPARISON:  10/29/2015 FINDINGS: Postoperative changes in the mediastinum. Bilateral chest tubes are present. Right PICC line with tip over the cavoatrial junction. Left central venous catheter or sheath with tip over the left side of the neck, likely in the left internal jugular vein. Cardiac enlargement without significant vascular congestion. Shallow inspiration with elevation of the left hemidiaphragm. Probable left pleural effusion. No definite pulmonary consolidation. IMPRESSION: Shallow inspiration. Probable left pleural effusion. Cardiac enlargement. Appliances appear in satisfactory position. Electronically Signed   By: Lucienne Capers M.D.   On: 10/30/2015 01:18   Dg Chest Port 1 View  10/29/2015  CLINICAL DATA:  Postop CABG 2 days ago. EXAM: PORTABLE CHEST 1 VIEW COMPARISON:  Portable chest x-ray of October 28, 2015 FINDINGS: The trachea and esophagus have been  extubated. The cardiac silhouette remains enlarged. The pulmonary vascularity is mildly engorged. The bilateral chest tubes and the mediastinal drain are in stable position. There is no pneumothorax or large pleural effusion. The Swan-Ganz catheter tip overlies a proximal right lower lobe pulmonary artery branch. The mediastinum is widened and accentuated by the hypo inflation. IMPRESSION: Bilateral hypoinflation following extubation of the trachea there is crowding of the pulmonary vascularity. Left basilar atelectasis or less likely pneumonia is more prominent today. Probable small left pleural effusion. The remaining support tubes and lines are in stable position. Electronically Signed   By: David  Martinique M.D.   On: 10/29/2015 07:40   Dg Chest Port 1 View  10/28/2015  CLINICAL DATA:  Post CABG, on ventilator, followup portable chest x-ray of 10/28/2015 EXAM: PORTABLE CHEST 1 VIEW COMPARISON:  None.  FINDINGS: The tip of the endotracheal tube is approximately 5.2 cm above the carina. Aeration has improved slightly. Atelectasis remains at the lung bases left-greater-than-right. Swan-Ganz catheter tip is in the right lower lobe pulmonary artery and bilateral chest tubes remain. No pneumothorax is seen. Heart size is stable. IMPRESSION: 1. Improved aeration. 2. Bilateral chest tubes.  No pneumothorax. 3. Tip of endotracheal tube approximately 5.2 cm above the carina. Electronically Signed   By: Ivar Drape M.D.   On: 10/28/2015 08:01   Dg Chest Port 1 View  10/27/2015  CLINICAL DATA:  Status post CABG EXAM: PORTABLE CHEST 1 VIEW COMPARISON:  10/27/2015 FINDINGS: Unchanged tracheostomy tube. Swan-Ganz central venous line is now seen with tip advanced into the right perihilar region. Stable right chest tube with and left chest tube. NG tube again crosses the gastroesophageal junction. Stable mediastinal drain. Enlarged cardiac silhouette. Limited inspiratory effect with bibasilar opacities likely representing atelectasis. IMPRESSION: Anticipated postoperative appearance with bibasilar atelectasis. Electronically Signed   By: Skipper Cliche M.D.   On: 10/27/2015 19:25   Dg Chest Portable 1 View  10/27/2015  CLINICAL DATA:  Status post coronary bypass grafting EXAM: PORTABLE CHEST 1 VIEW COMPARISON:  10/23/2015 FINDINGS: Cardiac shadow is mildly enlarged. A Swan-Ganz catheter is noted in the right pulmonary outflow tract. Bilateral thoracostomy catheters are seen. No pneumothorax is noted. An endotracheal tube is noted in satisfactory position. Intra-aortic balloon pump is noted over the aortic arch. Mild left basilar atelectasis is seen. Mild central vascular congestion is noted. IMPRESSION: Postoperative changes with tubes and lines as described above. Mild vascular congestion and left basilar atelectasis. Electronically Signed   By: Inez Catalina M.D.   On: 10/27/2015 17:23   Portable Chest X-ray 1  View  10/21/2015  CLINICAL DATA:  55 year old male with a history of acute systolic congestive heart failure EXAM: PORTABLE CHEST 1 VIEW COMPARISON:  10/20/2015 FINDINGS: Heart size unchanged, enlarged. Similar appearance of low lung volumes with interstitial opacities, interlobular septal thickening, and developing airspace disease of the bilateral lower lungs. No pneumothorax. Linear opacity overlying the mediastinum and the heart border, of on certain significance. Favored to overlie the patient. IMPRESSION: Evidence of worsening congestive heart failure, with increasing edema. New linear opacity overlies the mediastinum, favored to be overlying the patient, though cannot be localized on this view. Signed, Dulcy Fanny. Earleen Newport, DO [facility] Electronically Signed   By: Corrie Mckusick D.O.   On: 10/21/2015 07:16   Dg Chest Portable 1 View  10/20/2015  CLINICAL DATA:  Shortness of breath. EXAM: PORTABLE CHEST 1 VIEW COMPARISON:  None. FINDINGS: Midline trachea. Cardiomegaly accentuated by AP portable  technique. No pleural effusion or pneumothorax. Mildly low lung volumes with bibasilar atelectasis. IMPRESSION: Mild cardiomegaly and low lung volumes.  No acute findings. Electronically Signed   By: Abigail Miyamoto M.D.   On: 10/20/2015 18:09   Dg Abd Portable 1v  11/16/2015  CLINICAL DATA:  Ileus, acute MI with CHF EXAM: PORTABLE ABDOMEN - 1 VIEW COMPARISON:  Portable abdominal films of November 15, 2015 FINDINGS: There remain small amounts of gas within small bowel loops as well as and large bowel loops. No abnormally distended bowel loops are observed. There is gas in the stomach. A feeding tube is in place whose tip appears to lie at the duodenal -jejunal junction. IMPRESSION: Fairly stable appearance of the abdomen with moderate amounts of gas within small and large bowel loops consistent with ileus. No free extraluminal gas collections are observed.  Electronically Signed   By: David  Martinique M.D.   On: 11/16/2015 07:20   Dg Abd Portable 1v  11/15/2015  CLINICAL DATA:  Chronic ventilator dependent respiratory failure. Acute onset of nausea and vomiting. EXAM: PORTABLE ABDOMEN - 1 VIEW COMPARISON:  11/03/2015, 11/02/2015. FINDINGS: Feeding tube tip projects at the expected location of the 4th portion of the duodenum at or near the ligament Treitz. Nonobstructive bowel gas pattern. Gas in normal caliber loops of small bowel throughout the abdomen and pelvis. Gas throughout normal caliber colon from cecum to rectum. No suggestion of free intraperitoneal air. IMPRESSION: 1. Bowel gas pattern indicating a mild generalized ileus. No evidence of bowel obstruction. 2. Feeding tube tip appropriately positioned in the 4th portion of the duodenum at or near the ligament of Treitz. Electronically Signed   By: Evangeline Dakin M.D.   On: 11/15/2015 07:26   Dg Abd Portable 1v  11/02/2015  CLINICAL DATA:  Assess feeding tube positioning EXAM: PORTABLE ABDOMEN - 1 VIEW COMPARISON:  None in PACs FINDINGS: The repeated KUB with has less motion artifact. The tip of the feeding tube and nasogastric tube lie in the region of the distal gastric body in the pre-pyloric region. The bowel gas pattern is within the limits of normal. Numerous tubes and lines and electrodes overlie the abdomen. IMPRESSION: The tips of the feeding tube and the esophagogastric tube lie in the region of the distal gastric body in the pre-pyloric region. Electronically Signed   By: David  Martinique M.D.   On: 11/02/2015 12:16   Dg Addison Bailey G Tube Plc W/fl-no Rad  11/03/2015  CLINICAL DATA:  NASO G TUBE PLACEMENT WITH FLUORO Fluoroscopy was utilized by the requesting physician.  No radiographic interpretation.    PHYSICAL EXAM CVP 12-14 General: Sitting up in bed  Neck: JVP hard to see.  no thyromegaly or thyroid nodule.  S/p Trach  Lungs: mild rhonchi   CV: Nondisplaced PMI.  Heart regular S1/S2, no  S3/S4, no murmur. 1+ edema. Abdomen: Distended. Nontender. Good BS.   Neurologic: alert and oriented x 3 Extremities: No clubbing or cyanosis.  Dopplerable pedal pulses. Maculopapular rash on his sides  TELEMETRY: A flutter 60-70s   ASSESSMENT AND PLAN:  1. CAD: S/p late presentation inferior MI and CABG x 4. Continue statin, ASA. Coronary angiography 3/27 with multiple VT episodes showed patent LIMA-LAD and SVG-RCA.  Patent SVG-ramus and SVG-D but slow flow in both grafts with diffusely diseased small target vessels.  2. Cardiogenic shock: Acute systolic CHF with RV failure post inferior MI with suspected RV involvement. EF 25-30% on echo (difficult images on 3/24 echo). He has significant  RV failure from suspected RV infarct.  3/27 IABP placed to try to allow decrease in milrinone with frequent VT.  3/31 echo with EF 30-35%.  IABP out 4/2.  Milrinone off 4/7 - Off milrinone. Todays CO-OX 62%.  - CVP remains up.  - No spiro/dig with CKD.  - He is now off NO and on sildenafil 40 tid.   -Continue hydralazine to 50 mg tid for afterload reduction (no Imdur with sildenafil use).   3. Complete heart block: Pacemaker turned off 4/3.  Underlying NSR in 70s. Now in AFL. Plan TEE/DC-CV this week. Possibly tomorrow or Wednesday.  4. VF arrest 3/24 early am and again at night 3/24.  Only occasional PVCs today.  - Change amio to po.  5. AKI: Creatinine stable 2.12 Suspect ATN. Baseline creatinine 1.0. Renal following 6. ID: Possible LLL PNA.  He completed course of  vancomycin/levofloxacin.  7. Anemia: Oozing from trach  Got 2 units PRBCs on 4/4. hgb 10.2 8. Pulmonary: S/P Trach 4/5 on trach collar trials. 9. Hypernatremia: Sodium climbing despite free water boluses. On D5W  Renal following. 10. AFL - Baack in NSR. On heparin and IV amio.  11. Hypernatremia- Na coming down. 155>146.  11. Ileus - continue Reglan. Ambulate as tolerated.   Amy Clegg NP-C  11/18/2015 8:48 AM   Patient seen and  examined with Darrick Grinder, NP. We discussed all aspects of the encounter. I agree with the assessment and plan as stated above.   Continues to improve. Serum sodium much improved with D5w now cut back to 50/hr. CVP remains high at 17. BUN/Creatinine decreasing. Co-ox ok at 62%Will increase lasix to 40 bid. No further VT. Back in NSR on amio. Change amio to po. Continue heparin/warfarin. Continue PT.   Bensimhon, Daniel,MD 2:32 PM

## 2015-11-18 NOTE — Progress Notes (Signed)
Subjective:  Numbers much improved today- feels better as well   Objective Vital signs in last 24 hours: Filed Vitals:   11/18/15 0600 11/18/15 0700 11/18/15 0714 11/18/15 0739  BP: 116/78 119/80 119/80 119/80  Pulse: 72 73 73 75  Temp:    98.7 F (37.1 C)  TempSrc:    Oral  Resp: _0 Height:      Weight:      SpO2: 96% 98% 98% 96%   Weight change: 2.6 kg (5 lb 11.7 oz)  Intake/Output Summary (Last 24 hours) at 11/18/15 0037 Last data filed at 11/18/15 0600  Gross per 24 hour  Intake   5100 ml  Output   2081 ml  Net   3019 ml    Assessment/ Plan: Pt is [name] 55 y.o. yo male who with ischemic cardiomyopathy was admitted on 10/20/2015 with undergoing CABG and CEA- had AKI which has improved but remains stalled and also with uremia and hypernatremia Assessment/Plan: 1. AKI- improved but not quite back to baseline- is possible will have residual CKD based on this insult.  BUN could be staying [name] little high based on high protein tube feeds which obviously we want to continue- is not clinically uremic- numbers better 2. Hypernatremia- water deficient.  Currently on free water 400 q 4 hours= 2.6 liters and also IV d5 another 3600 per 24 hours- it appears that the IV dose has just been increased- continue to watch trend- has insensible losses with trach/gi as well as good UOP although not in the DI range.  Will  Hold lasix for [name] couple of days as well - better also - will continue fluids (d5 and free water) as is for another 24 hours, then can start to wean them.  Will resume lasix tomorrow 3. Anemia- present and stable 5. HTN/volume- seems euvolemic but free water deficient- hold lasix just temporarily   Renal will sign off again at least not write daily note- call with questions- I will watch his labs the next couple of days and change fluids based on levels  Christopher [name]    Labs: Basic Metabolic Panel:  Recent Labs Lab 11/12/15 0400  11/13/15 0430  11/16/15 1255  11/17/15 0416 11/18/15 0445  NA 151*  < > 152*  < > 154* 155* 146*  K 3.0*  < > 3.5  < > 3.8 4.3 4.1  CL 100*  < > 105  < > 113* 116* 111  CO2 35*  --  34*  < > _1 GLUCOSE 126*  < > 129*  < > 129* 107* 104*  BUN 146*  < > 115*  < > 102* 95* 83*  CREATININE 2.80*  < > 2.22*  < > 2.41* 2.34* 2.12*  CALCIUM 9.5  --  9.3  < > 8.9 9.0 8.6*  PHOS 4.3  --  5.8*  --   --   --   --   < > = values in this interval not displayed. Liver Function Tests:  Recent Labs Lab 11/12/15 0400 11/13/15 0430 11/17/15 0416  AST  --   --  50*  ALT  --   --  51  ALKPHOS  --   --  66  BILITOT  --   --  1.4*  PROT  --   --  6.5  ALBUMIN 2.5* 2.6* 2.6*    Recent Labs Lab 11/17/15 0416  AMYLASE 72   No results for input(s): AMMONIA in  the last 168 hours. CBC:  Recent Labs Lab 11/13/15 0430  11/15/15 0420 11/16/15 0400 11/17/15 0416 11/18/15 0445  WBC 10.4  --  13.2* 12.2* 9.7 10.2  HGB 10.9*  < > 10.9* 10.4* 10.3* 9.6*  HCT 36.6*  < > 36.1* 35.8* 35.4* 32.7*  MCV 94.6  --  95.3 95.0 94.4 92.9  PLT 337  --  299 265 236 227  < > = values in this interval not displayed. Cardiac Enzymes: No results for input(s): CKTOTAL, CKMB, CKMBINDEX, TROPONINI in the last 168 hours. CBG:  Recent Labs Lab 11/17/15 1135 11/17/15 1612 11/17/15 1911 11/17/15 2353 11/18/15 0404  GLUCAP 119* 79 95 121* 104*    Iron Studies: No results for input(s): IRON, TIBC, TRANSFERRIN, FERRITIN in the last 72 hours. Studies/Results: Dg Chest Port 1 View  11/18/2015  CLINICAL DATA:  Respiratory failure. EXAM: PORTABLE CHEST 1 VIEW COMPARISON:  11/17/2015. FINDINGS: Tracheostomy tube, feeding tube, right PICC line, left subclavian line in stable position. Prior CABG. Stable cardiomegaly. Persistent left lower lobe infiltrate/edema again noted. Right base atelectasis and/or infiltrate/edema noted. Small left pleural effusion . These changes may be related to bibasilar pneumonia and/or congestive heart failure .  The thorax. IMPRESSION: 1. Lines and tubes in stable position . 2.  Prior CABG.  Persistent cardiomegaly. 3. Persistent left lower lobe infiltrate and small left pleural effusion. Developing right lower lobe infiltrate. These changes may be related congestive heart failure with pulmonary edema. Bilateral pneumonia could also present in this fashion. Electronically Signed   By: Marcello Moores  Register   On: 11/18/2015 07:41   Dg Chest Port 1 View  11/17/2015  CLINICAL DATA:  Shortness of breath. EXAM: PORTABLE CHEST 1 VIEW COMPARISON:  11/16/2015. FINDINGS: Tracheostomy tube, feeding tube, right PICC line, left subclavian central line in stable position. Prior CABG. Cardiomegaly. Left lower lobe infiltrate [name] small left pleural effusion. Mild right base infiltrate. No pneumothorax. IMPRESSION: 1.  Lines and tubes in stable position. 2. Persistent left lower lobe infiltrate and left pleural effusion. No interim change. Mild right lower lobe infiltrate. No change from prior exam . 3.  Stable cardiomegaly. Electronically Signed   By: Marcello Moores  Register   On: 11/17/2015 07:19   Medications: Infusions: . sodium chloride Stopped (11/15/15 0600)  . amiodarone Stopped (11/16/15 1223)  . dextrose 150 mL/hr at 11/18/15 0600  . feeding supplement (VITAL HIGH PROTEIN) 1,000 mL (11/18/15 0600)  . heparin 1,000 Units/hr (11/18/15 0600)    Scheduled Medications: . amiodarone  150 mg Intravenous Once  . amiodarone  200 mg Oral BID  . antiseptic oral rinse  7 mL Mouth Rinse QID  . aspirin  81 mg Oral Daily  . atorvastatin  80 mg Oral q1800  . chlorhexidine gluconate (SAGE KIT)  15 mL Mouth Rinse BID  . feeding supplement (PRO-STAT SUGAR FREE 64)  30 mL Per Tube TID  . free water  400 mL Per Tube Q3H  . hydrALAZINE  25 mg Oral 3 times per day  . insulin aspart  0-24 Units Subcutaneous 6 times per day  . insulin detemir  12 Units Subcutaneous BID  . levalbuterol  1.25 mg Nebulization TID  . pantoprazole sodium  40 mg  Per Tube Daily  . potassium chloride  40 mEq Oral 3 times per day  . QUEtiapine  50 mg Oral QHS  . sildenafil  40 mg Oral TID  . silver sulfADIAZINE   Topical BID  . sodium chloride flush  10-40 mL  Intracatheter Q12H  . triamcinolone cream   Topical BID    have reviewed scheduled and prn medications.  Physical Exam: General: alert on vent- trying to talk Heart: RRR Lungs: mostly clear Abdomen: soft, non tender Extremities: some edema- third spacing due to low albumin     11/18/2015,8:07 AM  LOS: 29 days

## 2015-11-18 NOTE — Care Management Note (Addendum)
Case Management Note  Patient Details  Name: DOMNIQUE SCHETTLER MRN: [id number] Date of Birth: [birth year]  Subjective/Objective:  Pt is s/p CABG                  Action/Plan:  11/18/2015 Pt is now s/p IABP, tracheostomy, post op ileus on tube feeds.  LTACH referral received , CM provided referral for both [facility] and [facility].  CM spoke with pt and wife to provide choice of facility; pt adamantly chose [facility], requested resource to speak with them at bedside.  [facility] has accepted referral and insurance [employer] has been initiated.  CM will continue to monitor for disposition needs  Pt is now intubated.  CM will continue to follow for discharge needs  10/28/15  Pt is from home with wife; independent.  CM will continue to monitor for disposition needs   Expected Discharge Date:                  Expected Discharge Plan:  Long Term Acute Care (LTAC)  In-House Referral:     Discharge planning Services  CM Consult  Post Acute Care Choice:    Choice offered to:     DME Arranged:    DME Agency:     HH Arranged:    HH Agency:     Status of Service:  In process, will continue to follow  Medicare Important Message Given:    Date Medicare IM Given:    Medicare IM give by:    Date Additional Medicare IM Given:    Additional Medicare Important Message give by:     If discussed at Long Length of Stay Meetings, dates discussed:    Additional Comments:  Cherylann Parr, RN 11/18/2015, 2:54 PM

## 2015-11-19 ENCOUNTER — Inpatient Hospital Stay (HOSPITAL_COMMUNITY): Payer: BLUE CROSS/BLUE SHIELD

## 2015-11-19 DIAGNOSIS — R5381 Other malaise: Secondary | ICD-10-CM

## 2015-11-19 DIAGNOSIS — K567 Ileus, unspecified: Secondary | ICD-10-CM | POA: Insufficient documentation

## 2015-11-19 LAB — POCT I-STAT 3, ART BLOOD GAS (G3+)
Acid-Base Excess: 1 mmol/L (ref 0.0–2.0)
Bicarbonate: 24 mEq/L (ref 20.0–24.0)
O2 Saturation: 86 %
PCO2 ART: 31 mmHg — AB (ref 35.0–45.0)
TCO2: 25 mmol/L (ref 0–100)
pH, Arterial: 7.496 — ABNORMAL HIGH (ref 7.350–7.450)
pO2, Arterial: 46 mmHg — ABNORMAL LOW (ref 80.0–100.0)

## 2015-11-19 LAB — CBC
HCT: 35.5 % — ABNORMAL LOW (ref 39.0–52.0)
Hemoglobin: 10.5 g/dL — ABNORMAL LOW (ref 13.0–17.0)
MCH: 27.3 pg (ref 26.0–34.0)
MCHC: 29.6 g/dL — ABNORMAL LOW (ref 30.0–36.0)
MCV: 92.4 fL (ref 78.0–100.0)
Platelets: 267 10*3/uL (ref 150–400)
RBC: 3.84 MIL/uL — ABNORMAL LOW (ref 4.22–5.81)
RDW: 17.5 % — ABNORMAL HIGH (ref 11.5–15.5)
WBC: 11.5 10*3/uL — ABNORMAL HIGH (ref 4.0–10.5)

## 2015-11-19 LAB — POCT I-STAT, CHEM 8
BUN: 52 mg/dL — ABNORMAL HIGH (ref 6–20)
CALCIUM ION: 1.2 mmol/L (ref 1.12–1.23)
Chloride: 110 mmol/L (ref 101–111)
Creatinine, Ser: 1.9 mg/dL — ABNORMAL HIGH (ref 0.61–1.24)
Glucose, Bld: 129 mg/dL — ABNORMAL HIGH (ref 65–99)
HCT: 35 % — ABNORMAL LOW (ref 39.0–52.0)
HEMOGLOBIN: 11.9 g/dL — AB (ref 13.0–17.0)
Potassium: 4.3 mmol/L (ref 3.5–5.1)
SODIUM: 147 mmol/L — AB (ref 135–145)
TCO2: 23 mmol/L (ref 0–100)

## 2015-11-19 LAB — GLUCOSE, CAPILLARY
GLUCOSE-CAPILLARY: 112 mg/dL — AB (ref 65–99)
GLUCOSE-CAPILLARY: 109 mg/dL — AB (ref 65–99)
Glucose-Capillary: 131 mg/dL — ABNORMAL HIGH (ref 65–99)
Glucose-Capillary: 69 mg/dL (ref 65–99)
Glucose-Capillary: 80 mg/dL (ref 65–99)
Glucose-Capillary: 84 mg/dL (ref 65–99)
Glucose-Capillary: 86 mg/dL (ref 65–99)

## 2015-11-19 LAB — CARBOXYHEMOGLOBIN
CARBOXYHEMOGLOBIN: 1.9 % — AB (ref 0.5–1.5)
Carboxyhemoglobin: 1.8 % — ABNORMAL HIGH (ref 0.5–1.5)
METHEMOGLOBIN: 0.8 % (ref 0.0–1.5)
Methemoglobin: 0.8 % (ref 0.0–1.5)
O2 SAT: 62.6 %
O2 Saturation: 53.3 %
TOTAL HEMOGLOBIN: 11.2 g/dL — AB (ref 13.5–18.0)
Total hemoglobin: 11 g/dL — ABNORMAL LOW (ref 13.5–18.0)

## 2015-11-19 LAB — RENAL FUNCTION PANEL
Albumin: 2.7 g/dL — ABNORMAL LOW (ref 3.5–5.0)
Anion gap: 12 (ref 5–15)
BUN: 64 mg/dL — ABNORMAL HIGH (ref 6–20)
CALCIUM: 9 mg/dL (ref 8.9–10.3)
CO2: 24 mmol/L (ref 22–32)
CREATININE: 2.02 mg/dL — AB (ref 0.61–1.24)
Chloride: 110 mmol/L (ref 101–111)
GFR calc non Af Amer: 36 mL/min — ABNORMAL LOW (ref >60)
GFR, EST AFRICAN AMERICAN: 41 mL/min — AB (ref >60)
GLUCOSE: 94 mg/dL (ref 65–99)
Phosphorus: 4.5 mg/dL (ref 2.5–4.6)
Potassium: 3.8 mmol/L (ref 3.5–5.1)
SODIUM: 146 mmol/L — AB (ref 135–145)

## 2015-11-19 LAB — MAGNESIUM: Magnesium: 2.1 mg/dL (ref 1.7–2.4)

## 2015-11-19 MED ORDER — POTASSIUM CHLORIDE 10 MEQ/50ML IV SOLN
10.0000 meq | INTRAVENOUS | Status: AC
  Administered 2015-11-19 (×3): 10 meq via INTRAVENOUS
  Filled 2015-11-19: qty 50

## 2015-11-19 MED ORDER — FREE WATER
200.0000 mL | Freq: Four times a day (QID) | Status: DC
  Administered 2015-11-19 (×3): 200 mL

## 2015-11-19 MED ORDER — ENOXAPARIN SODIUM 40 MG/0.4ML ~~LOC~~ SOLN
40.0000 mg | SUBCUTANEOUS | Status: DC
Start: 2015-11-19 — End: 2015-11-24
  Administered 2015-11-19 – 2015-11-24 (×6): 40 mg via SUBCUTANEOUS
  Filled 2015-11-19 (×6): qty 0.4

## 2015-11-19 NOTE — Progress Notes (Signed)
Physical Therapy Treatment Patient Details Name: Christopher [name] MRN: [id number] DOB: Dec 31, 1960 Today's Date: 11/19/2015    History of Present Illness [name] w/ ischemic cardiomyopathy following MI. Patient underwent CABG & CEA. Post-operative course complicated by arrhythmia and biventricular failure, HCAP, heart block, trach placement.  pt with hx of HTN, TIA, and R Carotid Stenosis.    PT Comments    Pt clearly seems frustrated today and gets irritated easily.  Multiple attempts to cue for sternal precautions, but pt not following precautions today.  VSS throughout session on trach collar 14L 55% for ambulation.  Continue to feel pt would benefit from CIR level of therapies to maximize independence and continue education.  Will continue to follow.    Follow Up Recommendations  CIR     Equipment Recommendations  None recommended by PT    Recommendations for Other Services       Precautions / Restrictions Precautions Precautions: Sternal;Fall Precaution Comments: Reviewed sternal precautions with patient, but pt not following today. Restrictions Weight Bearing Restrictions: No    Mobility  Bed Mobility Overal bed mobility: Needs Assistance Bed Mobility: Sit to Supine       Sit to supine: Min guard   General bed mobility comments: Attempted to cue pt for sternal precautions, but pt waves PT away and throws himself back to bed.    Transfers Overall transfer level: Needs assistance Equipment used: Pushed w/c Transfers: Sit to/from Stand Sit to Stand: Min assist         General transfer comment: Cues for sternal precautions and UEs to knees, but pt not following and attempts to grab W/C and pull himself up.  2 attempts to come to standing with pt not following directions either time.    Ambulation/Gait Ambulation/Gait assistance: Min assist Ambulation Distance (Feet): 150 Feet Assistive device:  (pushing W/C) Gait Pattern/deviations: Step-through  pattern;Decreased stride length;Trunk flexed     General Gait Details: pt leans on W/C and moving quickly at times.  Multiple cues for pacing and attending to safety as pt seems to rush to get ambulation over with.  pt denies fatigue and pain when asked if those were reasons for rushing.     Stairs            Wheelchair Mobility    Modified Rankin (Stroke Patients Only)       Balance Overall balance assessment: Needs assistance Sitting-balance support: Feet supported;No upper extremity supported Sitting balance-Leahy Scale: Fair     Standing balance support: Bilateral upper extremity supported;During functional activity Standing balance-Leahy Scale: Poor                      Cognition Arousal/Alertness: Awake/alert Behavior During Therapy: Flat affect;Impulsive (Seems frustrated and easily irritated) Overall Cognitive Status: Difficult to assess                      Exercises      General Comments        Pertinent Vitals/Pain Pain Assessment: Faces Faces Pain Scale: Hurts little more Pain Location: pt grimaces, but shakes his head when asked about pain. Pain Descriptors / Indicators: Grimacing Pain Intervention(s): Monitored during session;Premedicated before session;Repositioned    Home Living                      Prior Function            PT Goals (current goals can now be found in  the care plan section) Acute Rehab PT Goals Patient Stated Goal: Unable to verbalize at this time. PT Goal Formulation: With patient Time For Goal Achievement: 11/29/15 Potential to Achieve Goals: Good Progress towards PT goals: Not progressing toward goals - comment (pt frustration today)    Frequency  Min 3X/week    PT Plan Current plan remains appropriate    Co-evaluation             End of Session Equipment Utilized During Treatment: Gait belt;Oxygen Activity Tolerance: Other (comment) (pt seems frustrated) Patient left: in  bed;with call bell/phone within reach;with nursing/sitter in room     Time: 0825-0842 PT Time Calculation (min) (ACUTE ONLY): 17 min  Charges:  $Gait Training: 8-22 mins                    G CodesSunny Schlein, [place] [phone] 11/19/2015, 9:10 AM

## 2015-11-19 NOTE — Evaluation (Signed)
Clinical/Bedside Swallow Evaluation Patient Details  Name: Christopher [name] MRN: [id number] Date of Birth: 08-Dec-1960  Today's Date: 11/19/2015 Time: SLP Start Time (ACUTE ONLY): 1104 SLP Stop Time (ACUTE ONLY): 1122 SLP Time Calculation (min) (ACUTE ONLY): 18 min  Past Medical History:  Past Medical History  Diagnosis Date  . Hypertension   . TIA (transient ischemic attack)   . Stenosis of right carotid artery   . Complete heart block (HCC) 10/20/2015   Past Surgical History:  Past Surgical History  Procedure Laterality Date  . Arm surgery Right 2003  . Eye surgery    . Laceration repair    . Cardiac catheterization N/A 10/20/2015    Procedure: Left Heart Cath and Coronary Angiography;  Surgeon: Tonny Bollman, MD;  Location: [facility] INVASIVE CV LAB;  Service: Cardiovascular;  Laterality: N/A;  . Coronary artery bypass graft N/A 10/27/2015    Procedure: CORONARY ARTERY BYPASS GRAFTING (CABG) x four, using left internal mammary artery and rt leg greater saphenous vein harvested endoscopically;  Surgeon: Kerin Perna, MD;  Location: [facility] OR;  Service: Open Heart Surgery;  Laterality: N/A;  . Tee without cardioversion N/A 10/27/2015    Procedure: TRANSESOPHAGEAL ECHOCARDIOGRAM (TEE);  Surgeon: Kerin Perna, MD;  Location: [facility] OR;  Service: Open Heart Surgery;  Laterality: N/A;  . Endarterectomy Right 10/27/2015    Procedure: RIGHT ENDARTERECTOMY CAROTID with patch angioplasty using Xenosure bovine pericardium patch;  Surgeon: Fransisco Hertz, MD;  Location: [facility] OR;  Service: Vascular;  Laterality: Right;  . Cardiac catheterization N/A 11/02/2015    Procedure: Right/Left Heart Cath and Coronary/Graft Angiography;  Surgeon: Laurey Morale, MD;  Location: [facility] INVASIVE CV LAB;  Service: Cardiovascular;  Laterality: N/A;   HPI:  55 y.o. male w/ ischemic cardiomyopathy following MI s/p CABG & CEA. Post-operative course complicated by arrhythmia and biventricular failure, HCAP, heart block. PMHx HTN,  stenosis of R carotid artery, TIA. Intubated 3/21-3/22, re-intubated 3/26 and failed to wean, trach placed 4/6.   Assessment / Plan / Recommendation Clinical Impression  SLP provided PO trials of chips and thin liquids by spoon and small, self-fed cup sips with seemingly swift swallow intiation and no overt s/s of aspiration; however, risk factors for aspiration include prolonged intubation and inability to utilize PSMV to optimize swallowing function. Assessment is also limited by inability to assess vocal quality post-swallow. Pt and his significant other report that his trach was changed today, and that his doctor plans to wait several more days before changing again to possibly a smaller size, which would likely facilitate PMSV use. Given the above, recommend to proceed with MBS to more objectively assess aspiration risk.    Aspiration Risk  Mild aspiration risk;Moderate aspiration risk    Diet Recommendation NPO   Medication Administration: Via alternative means    Other  Recommendations Oral Care Recommendations: Oral care QID   Follow up Recommendations  Inpatient Rehab    Frequency and Duration            Prognosis        Swallow Study   General HPI: 55 y.o. male w/ ischemic cardiomyopathy following MI s/p CABG & CEA. Post-operative course complicated by arrhythmia and biventricular failure, HCAP, heart block. PMHx HTN, stenosis of R carotid artery, TIA. Intubated 3/21-3/22, re-intubated 3/26 and failed to wean, trach placed 4/6. Type of Study: Bedside Swallow Evaluation Previous Swallow Assessment: none in chart Diet Prior to this Study: NPO;NG Tube Temperature Spikes Noted: Yes (99.6) Respiratory Status: Trach;[name]  Collar Trach Size and Type: #6;Cuff;Deflated;With PMSV not in place History of Recent Intubation: Yes Length of Intubations (days): 19 days Date extubated: 11/12/15 (trach) Behavior/Cognition: Alert;Pleasant mood;Cooperative Oral Cavity Assessment: Within  Functional Limits Oral Care Completed by SLP: No Oral Cavity - Dentition: Missing dentition;Other (Comment);Dentures, not available (bottom dentition but top dentures not available) Vision: Functional for self-feeding Self-Feeding Abilities: Able to feed self Patient Positioning: Upright in chair Baseline Vocal Quality: Other (comment) (unable to assess, not tolerating PMSV) Volitional Cough: Strong Volitional Swallow: Able to elicit    Oral/Motor/Sensory Function Overall Oral Motor/Sensory Function: Within functional limits   Ice Chips Ice chips: Within functional limits Presentation: Spoon   Thin Liquid Thin Liquid: Within functional limits Presentation: Cup;Self Fed;Spoon    Nectar Thick Nectar Thick Liquid: Not tested   Honey Thick Honey Thick Liquid: Not tested   Puree Puree: Not tested   Solid   GO   Solid: Not tested       [name], M.A. CCC-SLP [phone]  [name] 11/19/2015,11:44 AM

## 2015-11-19 NOTE — Progress Notes (Signed)
Pt requested to have trach collar set up changed due to secretions in tubing.  Fio2 decreased to 40%  Upon set up of new equipment. RT will continue to monitor.

## 2015-11-19 NOTE — Consult Note (Signed)
Physical Medicine and Rehabilitation Consult  Reason for Consult:  Debility Referring Physician: Dr. Donata Clay   HPI: Christopher [name] is a 55 y.o. male with history of HTN, R-CAS who was admitted via [place] [facility] with chest pain due to STEMI. He was found to have severe CAD on cardiac cath as well as > 80% R-ICA stenosis.  Acute on combined systolic/diastolic CHF with mild volume overload treated with diuretics. He underwent CABG X 4 with placement on IAB by Dr. Donata Clay and  R-CEA by Dr. Imogene Burn.  Post op had issues with VT/VF on 03/26-3/27 as well as issues with complete HB.  IABP replaced or pressure support and requiring shocks as well as antiarrythmic's, external pacer for HB as well as re-intubation. pressors.  Dr. Briant Cedar consulted for management of ARF due to ATN and recommended limiting steroids.  He was unable to tolerate attempts at vent wean and tracheostomy placed on 04/05 by Dr. Molli Knock. He has been treated for HCAP and has been on TF due to ongoing post op ileus. Bloody secretions from trach resolved with d/c of heparin and he been off vent X 24 hours with trach stand by. He remains NPO with PMSV trials initiated. Therapy ongoing and CIR recommended due to debility.    Review of Systems  Constitutional: Negative for fever.  HENT: Positive for congestion.   Eyes: Negative for blurred vision.  Respiratory: Positive for cough and hemoptysis.   Cardiovascular: Positive for chest pain.  Gastrointestinal: Negative for blood in stool.  Genitourinary: Negative for dysuria.  Musculoskeletal: Positive for myalgias.  Skin: Negative for itching.  Neurological: Negative for dizziness.  Psychiatric/Behavioral: Negative for hallucinations.      Past Medical History  Diagnosis Date  . Hypertension   . TIA (transient ischemic attack)   . Stenosis of right carotid artery   . Complete heart block (HCC) 10/20/2015    Past Surgical History  Procedure Laterality Date  .  Arm surgery Right 2003  . Eye surgery    . Laceration repair    . Cardiac catheterization N/A 10/20/2015    Procedure: Left Heart Cath and Coronary Angiography;  Surgeon: Tonny Bollman, MD;  Location: [facility] INVASIVE CV LAB;  Service: Cardiovascular;  Laterality: N/A;  . Coronary artery bypass graft N/A 10/27/2015    Procedure: CORONARY ARTERY BYPASS GRAFTING (CABG) x four, using left internal mammary artery and rt leg greater saphenous vein harvested endoscopically;  Surgeon: Kerin Perna, MD;  Location: [facility] OR;  Service: Open Heart Surgery;  Laterality: N/A;  . Tee without cardioversion N/A 10/27/2015    Procedure: TRANSESOPHAGEAL ECHOCARDIOGRAM (TEE);  Surgeon: Kerin Perna, MD;  Location: [facility] OR;  Service: Open Heart Surgery;  Laterality: N/A;  . Endarterectomy Right 10/27/2015    Procedure: RIGHT ENDARTERECTOMY CAROTID with patch angioplasty using Xenosure bovine pericardium patch;  Surgeon: Fransisco Hertz, MD;  Location: [facility] OR;  Service: Vascular;  Laterality: Right;  . Cardiac catheterization N/A 11/02/2015    Procedure: Right/Left Heart Cath and Coronary/Graft Angiography;  Surgeon: Laurey Morale, MD;  Location: [facility] INVASIVE CV LAB;  Service: Cardiovascular;  Laterality: N/A;    Family History  Problem Relation Age of Onset  . Heart disease Mother   . Failure to thrive Father   . Congestive Heart Failure Father     Deceased  . Breast cancer Sister     Living     Social History:  Married. Wife works days. Per reports that  he quit smoking about 13 years ago. He has never used smokeless tobacco. Per reports that he uses illicit drugs (Marijuana) regularly.  Per reports that he does not drink alcohol.     Allergies  Allergen Reactions  . Losartan Shortness Of Breath  . Elita Quick [Ceftazidime] Rash    Rash with blisters  . Lisinopril Rash    Medications Prior to Admission  Medication Sig Dispense Refill  . albuterol (PROAIR HFA) 108 (90 Base) MCG/ACT inhaler Inhale 2 puffs into the  lungs every 6 (six) hours as needed. Shortness of breath    . aspirin 325 MG tablet Take 325 mg by mouth daily.    Marland Kitchen lisinopril (PRINIVIL,ZESTRIL) 20 MG tablet Take 1 tablet (20 mg total) by mouth daily. 30 tablet 5  . lovastatin (MEVACOR) 20 MG tablet Take 1 tablet (20 mg total) by mouth at bedtime. 30 tablet 5  . [EXPIRED] predniSONE (DELTASONE) 10 MG tablet Take 1 tablet by mouth taper from 4 doses each day to 1 dose and stop.      Home: Home Living Family/patient expects to be discharged to:: Inpatient rehab Living Arrangements: Spouse/significant other Available Help at Discharge: Family (wife works) Additional Comments: Limited information due to trach/vent  Functional History: Prior Function Level of Independence: Independent Comments: States he worked PTA Functional Status:  Mobility: Bed Mobility Overal bed mobility: Needs Assistance Bed Mobility: Sit to Supine Rolling: Min assist Sidelying to sit: Min assist, HOB elevated, +2 for safety/equipment Sit to supine: Min guard General bed mobility comments: Attempted to cue pt for sternal precautions, but pt waves PT away and throws himself back to bed.   Transfers Overall transfer level: Needs assistance Equipment used: Pushed w/c Transfers: Sit to/from Stand Sit to Stand: Min assist General transfer comment: Cues for sternal precautions and UEs to knees, but pt not following and attempts to grab W/C and pull himself up.  2 attempts to come to standing with pt not following directions either time.   Ambulation/Gait Ambulation/Gait assistance: Min assist Ambulation Distance (Feet): 150 Feet Assistive device:  (pushing W/C) Gait Pattern/deviations: Step-through pattern, Decreased stride length, Trunk flexed General Gait Details: pt leans on W/C and moving quickly at times.  Multiple cues for pacing and attending to safety as pt seems to rush to get ambulation over with.  pt denies fatigue and pain when asked if those were  reasons for rushing.   Gait velocity: decreased Gait velocity interpretation: Below normal speed for age/gender    ADL:    Cognition: Cognition Overall Cognitive Status: Difficult to assess Orientation Level: Oriented X4 Cognition Arousal/Alertness: Awake/alert Behavior During Therapy: Flat affect, Impulsive (Seems frustrated and easily irritated) Overall Cognitive Status: Difficult to assess Difficult to assess due to: Tracheostomy   Blood pressure 125/85, pulse 80, temperature 98.3 F (36.8 C), temperature source Oral, resp. rate 21, height 5' 10.5" (1.791 m), weight 107.5 kg (236 lb 15.9 oz), SpO2 97 %. Physical Exam  Constitutional: He is oriented to person, place, and time. He appears well-developed.  HENT:  Mouth/Throat: No oropharyngeal exudate.  NGT in place  Eyes: EOM are normal. Pupils are equal, round, and reactive to light.  Neck:  TRACH in place, blood tinged sputum   Cardiovascular:  Tachycardic   Respiratory: No respiratory distress. He has rales.  GI: He exhibits distension.  Neurological: He is alert and oriented to person, place, and time.  UE grossly 4/5 prox to distal. LE: 3+ HF, 4-KE, 4 ADF/PF  Skin:  Chest incision  clean, RLE donor sites clean with staples  Psychiatric:  Flat     Results for orders placed or performed during the hospital encounter of 10/20/15 (from the past 24 hour(s))  Culture, respiratory (NON-Expectorated)     Status: None (Preliminary result)   Collection Time: 11/18/15 11:40 AM  Result Value Ref Range   Specimen Description TRACHEAL ASPIRATE    Special Requests Normal    Gram Stain      RARE WBC PRESENT, PREDOMINANTLY PMN RARE SQUAMOUS EPITHELIAL CELLS PRESENT RARE GRAM POSITIVE COCCI IN PAIRS Performed at [facility]    Culture NO GROWTH Performed at [facility]     Report Status PENDING   Glucose, capillary     Status: None   Collection Time: 11/18/15 11:43 AM  Result Value Ref Range    Glucose-Capillary 88 65 - 99 mg/dL  Glucose, capillary     Status: None   Collection Time: 11/18/15  3:32 PM  Result Value Ref Range   Glucose-Capillary 90 65 - 99 mg/dL   Comment 1 Notify RN   Glucose, capillary     Status: None   Collection Time: 11/18/15  7:13 PM  Result Value Ref Range   Glucose-Capillary 92 65 - 99 mg/dL   Comment 1 Capillary Specimen    Comment 2 Notify RN    Comment 3 Document in Chart   Glucose, capillary     Status: None   Collection Time: 11/18/15 11:40 PM  Result Value Ref Range   Glucose-Capillary 85 65 - 99 mg/dL   Comment 1 Capillary Specimen    Comment 2 Notify RN    Comment 3 Document in Chart   Glucose, capillary     Status: None   Collection Time: 11/19/15  3:54 AM  Result Value Ref Range   Glucose-Capillary 86 65 - 99 mg/dL   Comment 1 Capillary Specimen    Comment 2 Notify RN    Comment 3 Document in Chart   Magnesium     Status: None   Collection Time: 11/19/15  4:00 AM  Result Value Ref Range   Magnesium 2.1 1.7 - 2.4 mg/dL  CBC     Status: Abnormal   Collection Time: 11/19/15  4:00 AM  Result Value Ref Range   WBC 11.5 (H) 4.0 - 10.5 K/uL   RBC 3.84 (L) 4.22 - 5.81 MIL/uL   Hemoglobin 10.5 (L) 13.0 - 17.0 g/dL   HCT 16.1 (L) 09.6 - 04.5 %   MCV 92.4 78.0 - 100.0 fL   MCH 27.3 26.0 - 34.0 pg   MCHC 29.6 (L) 30.0 - 36.0 g/dL   RDW 40.9 (H) 81.1 - 91.4 %   Platelets 267 150 - 400 K/uL  Carboxyhemoglobin     Status: Abnormal   Collection Time: 11/19/15  4:00 AM  Result Value Ref Range   Total hemoglobin 11.0 (L) 13.5 - 18.0 g/dL   O2 Saturation 78.2 %   Carboxyhemoglobin 1.8 (H) 0.5 - 1.5 %   Methemoglobin 0.8 0.0 - 1.5 %  Renal function panel     Status: Abnormal   Collection Time: 11/19/15  4:00 AM  Result Value Ref Range   Sodium 146 (H) 135 - 145 mmol/L   Potassium 3.8 3.5 - 5.1 mmol/L   Chloride 110 101 - 111 mmol/L   CO2 24 22 - 32 mmol/L   Glucose, Bld 94 65 - 99 mg/dL   BUN 64 (H) 6 - 20 mg/dL   Creatinine, Ser  2.02 (H) 0.61 - 1.24 mg/dL   Calcium 9.0 8.9 - 16.1 mg/dL   Phosphorus 4.5 2.5 - 4.6 mg/dL   Albumin 2.7 (L) 3.5 - 5.0 g/dL   GFR calc non Af Amer 36 (L) >60 mL/min   GFR calc Af Amer 41 (L) >60 mL/min   Anion gap 12 5 - 15  Glucose, capillary     Status: None   Collection Time: 11/19/15  7:44 AM  Result Value Ref Range   Glucose-Capillary 84 65 - 99 mg/dL   Comment 1 Notify RN    Dg Chest Port 1 View  11/18/2015  CLINICAL DATA:  Respiratory failure. EXAM: PORTABLE CHEST 1 VIEW COMPARISON:  11/17/2015. FINDINGS: Tracheostomy tube, feeding tube, right PICC line, left subclavian line in stable position. Prior CABG. Stable cardiomegaly. Persistent left lower lobe infiltrate/edema again noted. Right base atelectasis and/or infiltrate/edema noted. Small left pleural effusion . These changes may be related to bibasilar pneumonia and/or congestive heart failure . The thorax. IMPRESSION: 1. Lines and tubes in stable position . 2.  Prior CABG.  Persistent cardiomegaly. 3. Persistent left lower lobe infiltrate and small left pleural effusion. Developing right lower lobe infiltrate. These changes may be related congestive heart failure with pulmonary edema. Bilateral pneumonia could also present in this fashion. Electronically Signed   By: Maisie Fus  Register   On: 11/18/2015 07:41   Dg Abd Portable 1v  11/18/2015  CLINICAL DATA:  55 year old male feeding tube placement. Initial encounter. EXAM: PORTABLE ABDOMEN - 1 VIEW COMPARISON:  11/16/2015. FINDINGS: Portable AP supine view at 1109 hours. A enteric tube placed, tip at the level of the distal second portion of the duodenum. Epicardial pacer wires remain in place. Increased gaseous distension of large bowel in the abdomen, but no dilated small bowel loops. Mild gaseous distension of the stomach. No definite pneumoperitoneum on this supine view. Stable visualized osseous structures. IMPRESSION: 1. Enteric tube placed, it tip at the mid duodenum. 2. Increased  large bowel gas, favor ileus over mechanical obstruction. Electronically Signed   By: Odessa Fleming M.D.   On: 11/18/2015 12:01    Assessment/Plan: Diagnosis: debility, dysphagia due to CAD, respiratory failure. S/P CABG x 4 1. Does the need for close, 24 hr/day medical supervision in concert with the patient's rehab needs make it unreasonable for this patient to be served in a less intensive setting? Yes 2. Co-Morbidities requiring supervision/potential complications: htn, cad, dysphagia 3. Due to bladder management, bowel management, safety, skin/wound care, disease management, medication administration, pain management and patient education, does the patient require 24 hr/day rehab nursing? Yes 4. Does the patient require coordinated care of a physician, rehab nurse, PT (1-2 hrs/day, 5 days/week), OT (1-2 hrs/day, 5 days/week) and SLP (1-2 hrs/day, 5 days/week) to address physical and functional deficits in the context of the above medical diagnosis(es)? Yes Addressing deficits in the following areas: balance, endurance, locomotion, strength, transferring, bowel/bladder control, bathing, dressing, feeding, grooming, toileting, swallowing and psychosocial support 5. Can the patient actively participate in an intensive therapy program of at least 3 hrs of therapy per day at least 5 days per week? Yes 6. The potential for patient to make measurable gains while on inpatient rehab is excellent 7. Anticipated functional outcomes upon discharge from inpatient rehab are modified independent  with PT, modified independent with OT, modified independent with SLP. 8. Estimated rehab length of stay to reach the above functional goals is: 7 days 9. Does the patient have adequate social supports and living  environment to accommodate these discharge functional goals? Yes 10. Anticipated D/C setting: Home 11. Anticipated post D/C treatments: HH therapy 12. Overall Rehab/Functional Prognosis:  excellent  RECOMMENDATIONS: This patient's condition is appropriate for continued rehabilitative care in the following setting: CIR Patient has agreed to participate in recommended program. Potentially Note that insurance prior authorization may be required for reimbursement for recommended care.  Comment: Will follow along for medical stability. Rehab Admissions Coordinator to follow up.  Thanks,  [name], MD, [place] [facility]     11/19/2015

## 2015-11-19 NOTE — Progress Notes (Signed)
17 Days Post-Op Procedure(s) (LRB): Right/Left Heart Cath and Coronary/Graft Angiography (N/A) Intra-Aortic Balloon Pump Insertion Subjective: Upset last pm and refused oral meds Tolerated trach collar off vent 24 hrs Will replace feeding tube today because he did not pass SLT eval diuresisng with lasix BID Sodium down to 145 Less bloody airway secretions of heparin drip- start lvenox Tracheal aspirate- gram + cocci in pairs Objective: Vital signs in last 24 hours: Temp:  [98.2 F (36.8 C)-99.6 F (37.6 C)] 98.3 F (36.8 C) (04/13 0700) Pulse Rate:  [74-100] 85 (04/13 0809) Cardiac Rhythm:  [-] Normal sinus rhythm (04/13 0800) Resp:  [16-28] 24 (04/13 0809) BP: (101-147)/(61-99) 142/99 mmHg (04/13 0809) SpO2:  [90 %-100 %] 97 % (04/13 0809) FiO2 (%):  [40 %-60 %] 60 % (04/13 0809) Weight:  [236 lb 15.9 oz (107.5 kg)] 236 lb 15.9 oz (107.5 kg) (04/13 0600)  Hemodynamic parameters for last 24 hours: CVP:  [15 mmHg-21 mmHg] 15 mmHg  Intake/Output from previous day: 04/12 0701 - 04/13 0700 In: 2694.2 [I.V.:2303.3; NG/GT:290.8; IV Piggyback:100] Out: 2810 [Urine:2810] Intake/Output this shift: Total I/O In: 100 [I.V.:100] Out: 200 [Urine:200]  OOB to commode More agreeable today nsr  Lab Results:  Recent Labs  11/18/15 0445 11/19/15 0400  WBC 10.2 11.5*  HGB 9.6* 10.5*  HCT 32.7* 35.5*  PLT 227 267   BMET:  Recent Labs  11/18/15 0445 11/19/15 0400  NA 146* 146*  K 4.1 3.8  CL 111 110  CO2 25 24  GLUCOSE 104* 94  BUN 83* 64*  CREATININE 2.12* 2.02*  CALCIUM 8.6* 9.0    PT/INR: No results for input(s): LABPROT, INR in the last 72 hours. ABG    Component Value Date/Time   PHART 7.419 11/13/2015 0526   HCO3 35.5* 11/13/2015 0526   TCO2 28 11/14/2015 1622   ACIDBASEDEF TEST WILL BE CREDITED 11/01/2015 0446   O2SAT 53.3 11/19/2015 0400   CBG (last 3)   Recent Labs  11/18/15 1913 11/18/15 2340 11/19/15 0354  GLUCAP 92 85 86     Assessment/Plan: S/P Procedure(s) (LRB): Right/Left Heart Cath and Coronary/Graft Angiography (N/A) Intra-Aortic Balloon Pump Insertion replace FT Cont lasix Stop heparin- start coumadin later when he can swallow safely    LOS: 30 days    Kathlee Nations Trigt III 11/19/2015

## 2015-11-19 NOTE — Progress Notes (Signed)
Pt blood sugar 69 gave three spoons of frozen apple juice, pt tolerated well. Checked blood sugar after 15 minutes and was 80. Will continue to monitor closely.

## 2015-11-19 NOTE — Progress Notes (Signed)
Placed patient back on the vent due to increased work of breathing and a decrease in Sp02 level.

## 2015-11-19 NOTE — Progress Notes (Signed)
Rehab admissions - I met with patient and his girlfriend at the bedside.  Patient is interested in inpatient rehab.  I have called and opened the case with [facility] insurance carrier requesting inpatient rehab admission.  I will follow up once I hear back from insurance case manager.  Call me for questions.  #[phone]

## 2015-11-19 NOTE — Progress Notes (Signed)
ABG obtained at 16:16 is possibly mixed venous sample as pt's PaO2 is low but his spo2 is 98% on monitor and is in no distress. Trach Collar Fio2 was increased from 40% to 80% in attempt to increase pao2 if indeed low. RN made aware. RT will continue to monitor.

## 2015-11-19 NOTE — Progress Notes (Signed)
      [address]       [place] [address]             [phone]      Resting comfortably  BP 117/62 mmHg  Pulse 86  Temp(Src) 99.1 F (37.3 C) (Oral)  Resp 24  Ht 5' 10.5" (1.791 m)  Wt 236 lb 15.9 oz (107.5 kg)  BMI 33.51 kg/m2  SpO2 95%   Intake/Output Summary (Last 24 hours) at 11/19/15 1948 Last data filed at 11/19/15 1900  Gross per 24 hour  Intake 2470.83 ml  Output   3550 ml  Net -1079.17 ml    Stable day, no new issues  Viviann Spare C. Dorris Fetch, MD [facility] [phone]

## 2015-11-19 NOTE — Evaluation (Signed)
Occupational Therapy Evaluation Patient Details Name: Christopher [name] MRN: [id number] DOB: Mar 20, 1961 Today's Date: 11/19/2015    History of Present Illness [name] w/ ischemic cardiomyopathy following MI. Patient underwent CABG & CEA. Post-operative course complicated by arrhythmia and biventricular failure, HCAP, heart block, trach placement.  pt with hx of HTN, TIA, and R Carotid Stenosis.   Clinical Impression   Pt was independent in ADL, IADL and mobility prior to admission. Presents with decreased activity tolerance, poor safety awareness and impulsivity and impaired balance impeding ability to perform at his baseline.  Pt has a supportive wife and is highly motivated to return to independence.  Recommending inpatient rehab. Will follow acutely.   Follow Up Recommendations  CIR;Supervision/Assistance - 24 hour    Equipment Recommendations  3 in 1 bedside comode;Tub/shower seat    Recommendations for Other Services       Precautions / Restrictions Precautions Precautions: Sternal;Fall Precaution Comments: reviewed sternal precaution with pt and wife Restrictions Weight Bearing Restrictions: No      Mobility Bed Mobility Overal bed mobility: Needs Assistance Bed Mobility: Rolling;Sidelying to Sit Rolling: Supervision Sidelying to sit: HOB elevated;Supervision   Sit to supine: Min guard   General bed mobility comments: good adherence to sternal precautions, increased time for side to sit, but no physical assistance  Transfers Overall transfer level: Needs assistance Equipment used: 1 person hand held assist Transfers: Sit to/from [employer] Sit to Stand: Min assist Stand pivot transfers: Min assist       General transfer comment: cues for sternal precautions from bed and 3 in 1    Balance Overall balance assessment: Needs assistance Sitting-balance support: Feet supported;No upper extremity supported Sitting balance-Leahy Scale:  Fair     Standing balance support: Bilateral upper extremity supported;During functional activity Standing balance-Leahy Scale: Fair                              ADL Overall ADL's : Needs assistance/impaired Eating/Feeding: NPO   Grooming: Wash/dry hands;Sitting;Supervision/safety   Upper Body Bathing: Minimal assitance;Sitting   Lower Body Bathing: Minimal assistance;Sit to/from stand   Upper Body Dressing : Minimal assistance;Sitting   Lower Body Dressing: Minimal assistance;Sit to/from stand Lower Body Dressing Details (indicate cue type and reason): required rest break between putting socks on, able to cross foot over opposite knee Toilet Transfer: Minimal assistance;Stand-pivot;BSC   Toileting- Clothing Manipulation and Hygiene: Total assistance;Sit to/from stand               Vision Additional Comments: glasses not available   Perception     Praxis      Pertinent Vitals/Pain Pain Assessment: No/denies pain Faces Pain Scale: Hurts little more Pain Location: pt grimaces, but shakes his head when asked about pain. Pain Descriptors / Indicators: Grimacing Pain Intervention(s): Monitored during session;Premedicated before session;Repositioned     Hand Dominance Right   Extremity/Trunk Assessment Upper Extremity Assessment Upper Extremity Assessment: Overall WFL for tasks assessed   Lower Extremity Assessment Lower Extremity Assessment: Defer to PT evaluation   Cervical / Trunk Assessment Cervical / Trunk Assessment: Normal   Communication Communication Communication: Tracheostomy (pt writing responses)   Cognition Arousal/Alertness: Awake/alert Behavior During Therapy: Flat affect;Impulsive Overall Cognitive Status: Difficult to assess                     General Comments       Exercises  Shoulder Instructions      Home Living Family/patient expects to be discharged to:: Inpatient rehab Living Arrangements:  Spouse/significant other Available Help at Discharge: Family;Available 24 hours/day (wife will take off work to care for pt) Type of Home: Apartment Home Access: Stairs to enter [employer] of Steps: 8+8 Entrance Stairs-Rails: Right;Left Home Layout: One level     Bathroom Shower/Tub: Chief Strategy Officer: Handicapped height     Home Equipment: None          Prior Functioning/Environment Level of Independence: Independent        Comments: 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    OT Diagnosis: Generalized weakness;Cognitive deficits   OT Problem List: Decreased strength;Decreased activity tolerance;Impaired balance (sitting and/or standing);Decreased safety awareness;Decreased knowledge of use of DME or AE;Decreased knowledge of precautions;Cardiopulmonary status limiting activity   OT Treatment/Interventions: Self-care/ADL training;DME and/or AE instruction;Patient/family education;Balance training;Energy conservation    OT Goals(Current goals can be found in the care plan section) Acute Rehab OT Goals Patient Stated Goal: return to baking cakes OT Goal Formulation: With patient Time For Goal Achievement: 12/03/15 Potential to Achieve Goals: Good ADL Goals Pt Will Perform Grooming: standing;with supervision Pt Will Perform Upper Body Bathing: with supervision;sitting Pt Will Perform Lower Body Bathing: with supervision;sit to/from stand Pt Will Perform Upper Body Dressing: with supervision;sitting Pt Will Perform Lower Body Dressing: with supervision;sit to/from stand Pt Will Transfer to Toilet: with supervision;ambulating;bedside commode (over toilet as needed) Pt Will Perform Toileting - Clothing Manipulation and hygiene: with supervision;sit to/from stand Pt Will Perform Tub/Shower Transfer: Tub transfer;with supervision;ambulating;tub bench Additional ADL Goal #1: Pt will adhere to sternal precautions during ADL and  mobility. Additional ADL Goal #2: Pt will utilize energy conservation strategies in ADL and IADL with minimal cues.  OT Frequency: Min 2X/week   Barriers to D/C:            Co-evaluation              End of Session Equipment Utilized During Treatment: Gait belt;Oxygen Nurse Communication: Mobility status  Activity Tolerance: Patient limited by fatigue Patient left: in chair;with call bell/phone within reach;with nursing/sitter in room;with family/visitor present   Time: 1478-2956 OT Time Calculation (min): 32 min Charges:  OT General Charges $OT Visit: 1 Procedure OT Evaluation $OT Eval High Complexity: 1 Procedure OT Treatments $Self Care/Home Management : 8-22 mins G-Codes:    Evern Bio 11/19/2015, 12:23 PM  [phone]

## 2015-11-19 NOTE — Progress Notes (Signed)
Speech Language Pathology Treatment: Christopher [name] Speaking valve  Patient Details Name: Christopher [name] MRN: [id number] DOB: 03-May-1961 Today's Date: 11/19/2015 Time: 8472-0721 SLP Time Calculation (min) (ACUTE ONLY): 8 min  Assessment / Plan / Recommendation Clinical Impression  Pt's cuff was deflated upon SLP arrival. Valve was placed for <60 second intervals with VS stable but increasing WOB. Back pressure noted upon removal, indicative of decreased upper airway patency suspect related to size of trach, although also may be impacted by secretions as well. Pt does do a good job of coughing to clear secretions both tracheally and orally, with less secretions observed today than on previous date. Recommend to continue PMSV use with SLP only.   HPI HPI: 55 y.o. male w/ ischemic cardiomyopathy following MI s/p CABG & CEA. Post-operative course complicated by arrhythmia and biventricular failure, HCAP, heart block. PMHx HTN, stenosis of R carotid artery, TIA. Intubated 3/21-3/22, re-intubated 3/26 and failed to wean, trach placed 4/6.      SLP Plan  MBS     Recommendations         Patient may use Passy-Muir Speech Valve: with SLP only PMSV Supervision: Full MD: Please consider changing trach tube to : Smaller size;Cuffless      Oral Care Recommendations: Oral care QID Follow up Recommendations: Inpatient Rehab Plan: MBS     GO               Christopher [name], M.A. CCC-SLP [phone]  Christopher [name] 11/19/2015, 11:34 AM

## 2015-11-19 NOTE — Progress Notes (Signed)
Patient ID: Christopher [name], male   DOB: 02/27/1961, 55 y.o.   MRN: [id number]   ADVANCED HF ROUNDING NOTE  Subjective:  Continues to improve. No further bleeding from trach.  Feeling stronger. Ambulated the entire unit. Remains in NSR.   Serum sodium improved. Renal function improved. CVP still up (15-17). Increase lasix to 40 IV bid  Co-ox slightly lower. Will recheck.    Scheduled Meds: . amiodarone  200 mg Oral BID  . antiseptic oral rinse  7 mL Mouth Rinse QID  . aspirin  81 mg Oral Daily  . atorvastatin  80 mg Oral q1800  . chlorhexidine gluconate (SAGE KIT)  15 mL Mouth Rinse BID  . enoxaparin (LOVENOX) injection  40 mg Subcutaneous Q24H  . feeding supplement (PRO-STAT SUGAR FREE 64)  30 mL Per Tube TID  . free water  200 mL Per Tube QID  . furosemide  40 mg Intravenous BID  . hydrALAZINE  25 mg Oral 3 times per day  . insulin aspart  0-24 Units Subcutaneous 6 times per day  . insulin detemir  12 Units Subcutaneous BID  . levalbuterol  1.25 mg Nebulization TID  . pantoprazole sodium  40 mg Per Tube Daily  . potassium chloride  40 mEq Oral 3 times per day  . QUEtiapine  50 mg Oral QHS  . sildenafil  40 mg Oral TID  . silver sulfADIAZINE   Topical BID  . sodium chloride flush  10-40 mL Intracatheter Q12H  . triamcinolone cream   Topical BID   Continuous Infusions: . sodium chloride Stopped (11/15/15 0600)  . sodium chloride Stopped (11/19/15 1135)  . dextrose 50 mL/hr at 11/19/15 1400  . feeding supplement (VITAL 1.5 CAL) 1,000 mL (11/19/15 1347)  . feeding supplement (VITAL HIGH PROTEIN) Stopped (11/18/15 1015)   PRN Meds:.diphenhydrAMINE, fentaNYL (SUBLIMAZE) injection, levalbuterol, midazolam, ondansetron (ZOFRAN) IV, sodium chloride flush, traMADol    Filed Vitals:   11/19/15 1141 11/19/15 1200 11/19/15 1300 11/19/15 1400  BP: 132/97 122/80 119/74 121/81  Pulse: 84 83 86 82  Temp:      TempSrc:      Resp: 26 20 24 11   Height:      Weight:        SpO2: 100% 95% 95% 93%    Intake/Output Summary (Last 24 hours) at 11/19/15 1632 Last data filed at 11/19/15 1400  Gross per 24 hour  Intake 2270.83 ml  Output   3300 ml  Net -1029.17 ml    LABS: Basic Metabolic Panel:  Recent Labs  11/18/15 0445 11/19/15 0400  NA 146* 146*  K 4.1 3.8  CL 111 110  CO2 25 24  GLUCOSE 104* 94  BUN 83* 64*  CREATININE 2.12* 2.02*  CALCIUM 8.6* 9.0  MG 2.3 2.1  PHOS  --  4.5   Liver Function Tests:  Recent Labs  11/17/15 0416 11/19/15 0400  AST 50*  --   ALT 51  --   ALKPHOS 66  --   BILITOT 1.4*  --   PROT 6.5  --   ALBUMIN 2.6* 2.7*    Recent Labs  11/17/15 0416  AMYLASE 72   CBC:  Recent Labs  11/18/15 0445 11/19/15 0400  WBC 10.2 11.5*  HGB 9.6* 10.5*  HCT 32.7* 35.5*  MCV 92.9 92.4  PLT 227 267   Cardiac Enzymes: No results for input(s): CKTOTAL, CKMB, CKMBINDEX, TROPONINI in the last 72 hours. BNP: Invalid input(s): POCBNP D-Dimer: No results for input(s): DDIMER  in the last 72 hours. Hemoglobin A1C: No results for input(s): HGBA1C in the last 72 hours. Fasting Lipid Panel: No results for input(s): CHOL, HDL, LDLCALC, TRIG, CHOLHDL, LDLDIRECT in the last 72 hours. Thyroid Function Tests: No results for input(s): TSH, T4TOTAL, T3FREE, THYROIDAB in the last 72 hours.  Invalid input(s): FREET3 Anemia Panel: No results for input(s): VITAMINB12, FOLATE, FERRITIN, TIBC, IRON, RETICCTPCT in the last 72 hours.  RADIOLOGY: Dg Chest 2 View  10/23/2015  CLINICAL DATA:  CHF.  Weakness. EXAM: CHEST  2 VIEW COMPARISON:  10/21/2015 FINDINGS: Multiple interstitial densities in the lower chest are suggestive for interstitial edema, right side greater than left. Heart size is upper limits of normal. Trachea is midline. Negative for a pneumothorax. Evidence for a small right pleural effusion. IMPRESSION: Interstitial densities in the lower chest, right side greater than left. Findings are suggestive for interstitial  pulmonary edema. Difficult to exclude some airspace disease in the right lower lung. Evidence for small right pleural effusion. Electronically Signed   By: Markus Daft M.D.   On: 10/23/2015 08:27   Dg Abd 1 View  11/03/2015  CLINICAL DATA:  Enteric tube advanced to post pyloric position. EXAM: ABDOMEN - 1 VIEW COMPARISON:  11/02/2015 abdominal radiographs. FINDINGS: Fluoroscopy time 7 minutes. Enteric tube courses through the stomach and duodenum and appears to terminate near the duodenal jejunal junction. Instilled contrast opacifies nondilated proximal jejunal small bowel loops. No contrast leak is demonstrated. Separate enteric tube terminates in the body of the stomach. Visualized lower sternotomy wires appear aligned and intact. IMPRESSION: Enteric tube appears to terminate near the duodenal jejunal junction. Separate enteric tube terminates in the body of the stomach. Electronically Signed   By: Ilona Sorrel M.D.   On: 11/03/2015 17:02   Dg Chest Port 1 View  11/18/2015  CLINICAL DATA:  Respiratory failure. EXAM: PORTABLE CHEST 1 VIEW COMPARISON:  11/17/2015. FINDINGS: Tracheostomy tube, feeding tube, right PICC line, left subclavian line in stable position. Prior CABG. Stable cardiomegaly. Persistent left lower lobe infiltrate/edema again noted. Right base atelectasis and/or infiltrate/edema noted. Small left pleural effusion . These changes may be related to bibasilar pneumonia and/or congestive heart failure . The thorax. IMPRESSION: 1. Lines and tubes in stable position . 2.  Prior CABG.  Persistent cardiomegaly. 3. Persistent left lower lobe infiltrate and small left pleural effusion. Developing right lower lobe infiltrate. These changes may be related congestive heart failure with pulmonary edema. Bilateral pneumonia could also present in this fashion. Electronically Signed   By: Marcello Moores  Register   On: 11/18/2015 07:41   Dg Chest Port 1 View  11/17/2015  CLINICAL DATA:  Shortness of breath. EXAM:  PORTABLE CHEST 1 VIEW COMPARISON:  11/16/2015. FINDINGS: Tracheostomy tube, feeding tube, right PICC line, left subclavian central line in stable position. Prior CABG. Cardiomegaly. Left lower lobe infiltrate a small left pleural effusion. Mild right base infiltrate. No pneumothorax. IMPRESSION: 1.  Lines and tubes in stable position. 2. Persistent left lower lobe infiltrate and left pleural effusion. No interim change. Mild right lower lobe infiltrate. No change from prior exam . 3.  Stable cardiomegaly. Electronically Signed   By: Marcello Moores  Register   On: 11/17/2015 07:19   Dg Chest Port 1 View  11/16/2015  CLINICAL DATA:  Follow-up respiratory failure EXAM: PORTABLE CHEST 1 VIEW COMPARISON:  11/15/2014 FINDINGS: Prior CABG. Tracheostomy tube, right PICC line and left subclavian central line remain in place, unchanged. There is cardiomegaly with vascular congestion. Continued left lower lobe  atelectasis or pneumonia and suspect small left pleural effusion. No significant change since prior study. No confluent opacity on the right. IMPRESSION: Stable left lower lobe atelectasis or pneumonia with small left effusion. Stable cardiomegaly with vascular congestion. Electronically Signed   By: Rolm Baptise M.D.   On: 11/16/2015 07:27   Dg Chest Port 1 View  11/15/2015  CLINICAL DATA:  Chronic ventilator dependent respiratory failure. CABG and right carotid endarterectomy 10/27/2015. EXAM: PORTABLE CHEST 1 VIEW COMPARISON:  11/14/2015 and earlier. FINDINGS: Tracheostomy tube tip in satisfactory position below the thoracic inlet. Left subclavian central venous catheter tip projects over the mid SVC, unchanged. Right arm PICC tip projects over lower SVC at or near the cavoatrial junction, unchanged. Feeding tube courses below the diaphragm into the stomach. Sternotomy for CABG. Cardiac silhouette moderately enlarged. Mild pulmonary venous hypertension without overt edema, unchanged. Dense consolidation in the left lower  lobe with air bronchograms, unchanged. Moderate-sized left pleural effusion, unchanged. No new pulmonary parenchymal abnormalities. IMPRESSION: 1.  Support apparatus satisfactory. 2. Stable dense left lower lobe atelectasis and/or pneumonia and left pleural effusion. 3. Stable cardiomegaly and pulmonary venous hypertension without overt edema. 4. No new abnormalities. Electronically Signed   By: Evangeline Dakin M.D.   On: 11/15/2015 07:22   Dg Chest Port 1 View  11/14/2015  CLINICAL DATA:  55 year old male with a history of shortness of breath. Patient admission 10/20/2015. Right carotid endarterectomy 10/27/2015, with same-day coronary artery bypass grafting 10/27/2015. Twelve days postop now with history of intra-aortic balloon pump. EXAM: PORTABLE CHEST 1 VIEW COMPARISON:  11/13/2015, 11/12/2015, 11/11/2015 FINDINGS: Cardiomediastinal silhouette likely unchanged, with the left heart border partially obscured by overlying lung and pleural disease. Low lung volumes persist with bibasilar opacities and obscuration of the retrocardiac region. Obscuration of left hemidiaphragm. Unchanged tracheostomy tube, enteric feeding tube terminating out of the field of view, right upper extremity PICC and left subclavian central line. IMPRESSION: Similar appearance of the chest x-ray, with persisting bibasilar airspace opacities and left pleural effusion. Unchanged support apparatus as above. Signed, Dulcy Fanny. Earleen Newport, DO [facility] Electronically Signed   By: Corrie Mckusick D.O.   On: 11/14/2015 08:48   Dg Chest Port 1 View  11/13/2015  CLINICAL DATA:  Shortness of breath. EXAM: PORTABLE CHEST 1 VIEW COMPARISON:  November 12, 2015. FINDINGS: Stable cardiomegaly. Status post coronary bypass graft. Tracheostomy and feeding tube are unchanged. Bilateral subclavian catheters are unchanged. No pneumothorax is noted. Stable bibasilar opacities are noted with left greater than  right. Stable left pleural effusion is noted. Bony thorax is unremarkable. IMPRESSION: Stable support apparatus. Stable bibasilar opacities. Stable left pleural effusion. Electronically Signed   By: Marijo Conception, M.D.   On: 11/13/2015 07:37   Dg Chest Port 1 View  11/12/2015  CLINICAL DATA:  Respiratory failure. EXAM: PORTABLE CHEST 1 VIEW COMPARISON:  11/11/2015. FINDINGS: Tracheostomy tube, feeding tube, left IJ line, right PICC line in stable position . Prior CABG. Cardiomegaly with diffuse bilateral pulmonary infiltrates consistent pulmonary edema. Progressed from prior exam. Small bilateral pleural effusions. No pneumothorax. IMPRESSION: 1. Lines and tubes in stable position. 2. Prior CABG. Cardiomegaly with progressive bilateral pulmonary infiltrates consistent with pulmonary edema. Small bilateral pleural effusions are noted. Electronically Signed   By: Marcello Moores  Register   On: 11/12/2015 07:46   Dg Chest Port 1 View  11/11/2015  CLINICAL DATA:  Tracheostomy placement EXAM: PORTABLE CHEST 1 VIEW COMPARISON:  11/11/2015 FINDINGS: Tracheostomy tube has been  placed with the tip projecting over the mid trachea. No pneumothorax. Right PICC line remains in place, unchanged as does left central line. Cardiomegaly with bilateral perihilar and lower lobe opacities, likely edema. Suspect layering effusions. IMPRESSION: Interval placement tracheostomy tube without pneumothorax. Suspected CHF. Bilateral effusions. Findings similar to prior study. Electronically Signed   By: Rolm Baptise M.D.   On: 11/11/2015 14:50   Dg Chest Port 1 View  11/11/2015  CLINICAL DATA:  Respiratory failure. EXAM: PORTABLE CHEST 1 VIEW COMPARISON:  11/10/2015. FINDINGS: Endotracheal tube, feeding tube, bilateral subclavian lines in stable position. Prior CABG. Cardiomegaly with diffuse bilateral pulmonary infiltrates consistent with pulmonary edema. Worsening from prior exam. Small left pleural effusion. No pneumothorax. IMPRESSION: 1.   Lines and tubes in stable position. 2. Prior CABG. Cardiomegaly with worsening bilateral pulmonary edema. Small left pleural effusion. Electronically Signed   By: Marcello Moores  Register   On: 11/11/2015 07:17   Dg Chest Port 1 View  11/10/2015  CLINICAL DATA:  Shortness of breath . EXAM: PORTABLE CHEST 1 VIEW COMPARISON:  11/09/2015. FINDINGS: Endotracheal tube, NG tube, right PICC line, left subclavian line in stable position. Prior CABG. Cardiomegaly. Left lower lobe atelectasis and/or infiltrate with small left pleural effusion. Mild right base atelectasis and or infiltrate. No pneumothorax . IMPRESSION: 1.  Lines and tubes in stable position. 2. Left lower lobe atelectasis and/or infiltrate with small left pleural effusion. Mild right base atelectasis and/or infiltrate. Findings stable from prior exam. 3.  Prior CABG.  Stable cardiomegaly. Electronically Signed   By: Marcello Moores  Register   On: 11/10/2015 07:51   Dg Chest Port 1 View  11/09/2015  CLINICAL DATA:  Atelectasis. EXAM: PORTABLE CHEST 1 VIEW COMPARISON:  11/08/2015 FINDINGS: Endotracheal tube in satisfactory position. Right arm PICC tip in the lower SVC unchanged. Left subclavian central venous catheter in the SVC. NG tube and feeding tube enters the stomach with the tips not visualized. Negative for pneumothorax. Left lower lobe airspace disease unchanged. Small left effusion unchanged. Mild pulmonary vascular congestion unchanged. IMPRESSION: Support lines remain in satisfactory position and unchanged Left lower lobe atelectasis/ infiltrate unchanged. Mild right lower lobe airspace consolidation stable. Mild vascular congestion unchanged. Electronically Signed   By: Franchot Gallo M.D.   On: 11/09/2015 07:36   Dg Chest Port 1 View  11/08/2015  CLINICAL DATA:  Respiratory failure, hypertension, coronary artery disease post MI and CABG EXAM: PORTABLE CHEST 1 VIEW COMPARISON:  Portable exam 1216 hours compared to 11/06/2015 FINDINGS: Tip of endotracheal  tube projects 6.8 cm above carina. Nasogastric tube and feeding tube extends into stomach. LEFT subclavian central venous catheter with tip projecting over SVC. RIGHT arm PICC line tip projects over SVC. Enlargement of cardiac silhouette post CABG. Stable mediastinal contours. Increased LEFT lower lobe consolidation. Slightly increased infiltrate at RIGHT base as well. Minimal central peribronchial thickening. No pleural effusion or pneumothorax. IMPRESSION: Enlargement of cardiac silhouette post CABG. Stable line and tube positions. Increased LEFT lower lobe consolidation with minimally increased infiltrate at RIGHT base as well. Findings are suspicious for pneumonia involving the LEFT lower lobe though a component of coexisting pulmonary edema may be present. Electronically Signed   By: Lavonia Dana M.D.   On: 11/08/2015 12:47   Dg Chest Port 1 View  11/06/2015  CLINICAL DATA:  Acute CHF secondary to acute MI, complete heart block. EXAM: PORTABLE CHEST 1 VIEW COMPARISON:  Portable chest x-ray of November 05, 2015 FINDINGS: The lungs are adequately inflated. The pulmonary interstitial markings  are less prominent on the right today. They have improved on the left as well and the hemidiaphragm is now visible. There is persistent interstitial edema on the left. The cardiac silhouette remains enlarged. The intraaortic balloon pump marker tip projects over the proximal portion of descending thoracic aorta and appears stable. The endotracheal tube tip projects 5.5 cm above the carina. The nasogastric and Doppler AH feeding tubes have their tips below the inferior margin of the image. The right-sided PICC line tip projects over the junction of the middle and distal thirds of the SVC. The left subclavian venous catheter tip projects over the midportion of the SVC. IMPRESSION: Decreasing pulmonary edema consistent with improvement in CHF. Persistent perihilar interstitial edema on the left. The support tubes and devices are  in reasonable position. Electronically Signed   By: David  Martinique M.D.   On: 11/06/2015 07:32   Dg Chest Port 1 View  11/05/2015  CLINICAL DATA:  Respiratory failure. EXAM: PORTABLE CHEST 1 VIEW COMPARISON:  11/04/2015 . FINDINGS: Endotracheal tube, NG tube, right PICC line stable position. Prior CABG. Cardiomegaly. Persistent left mid and lower lung field infiltrate. Persistent mild atelectatic changes right upper and right lower lobes. No pneumothorax. IMPRESSION: 1.  Lines and tubes in stable position. 2. Persistent left mid and left lower lung infiltrate, no interim change. Persistent right upper lobe and right lower lobe subsegmental atelectasis. No interim change. 3. Prior CABG.  Stable cardiomegaly. Electronically Signed   By: Marcello Moores  Register   On: 11/05/2015 07:48   Dg Chest Port 1 View  11/04/2015  CLINICAL DATA:  Hypoxia EXAM: PORTABLE CHEST 1 VIEW COMPARISON:  November 04, 2015 FINDINGS: Endotracheal tube tip is 5.3 cm above the carina. Central catheter tip is in the superior vena cava near the cavoatrial junction. Nasogastric tube tip and side port are below the diaphragm. There also is a feeding tube with the tip below the diaphragm. No pneumothorax. There has been removal of a left chest tube. There is airspace consolidation throughout the left mid and lower lung zones, stable. There is mild atelectasis in the right upper lobe, stable. No new opacity is evident. There is stable cardiomegaly. The pulmonary vascularity is normal. No adenopathy is evident. IMPRESSION: Tube and catheter positions as described without pneumothorax. Airspace consolidation throughout the left mid and lower lung zones, stable. Atelectasis right upper lobe, stable. No new opacity. Stable cardiomegaly. Electronically Signed   By: Lowella Grip III M.D.   On: 11/04/2015 14:52   Dg Chest Port 1 View  11/04/2015  CLINICAL DATA:  Status post CABG 10/28/2015. EXAM: PORTABLE CHEST 1 VIEW COMPARISON:  Single view of the  chest 11/03/2015 and 11/02/2015. FINDINGS: Support tubes and lines including a left chest tube are unchanged since the most recent exam. The chest is better expanded today with decreased atelectasis. Airspace opacity in the left mid and lower lung zones persists. There is likely a left pleural effusion. Marked cardiomegaly is seen with only mild vascular congestion noted. IMPRESSION: Increased pulmonary expansion with decreased scattered atelectasis. Left mid and lower lung zone airspace disease could be due to atelectasis or pneumonia. Small left pleural effusion noted. Support tubes and lines projecting good position.  No pneumothorax. Electronically Signed   By: Inge Rise M.D.   On: 11/04/2015 07:30   Dg Chest Port 1 View  11/03/2015  CLINICAL DATA:  Central line placement. EXAM: PORTABLE CHEST 1 VIEW COMPARISON:  11/03/2015. FINDINGS: Left subclavian central line noted with tip at the cavoatrial  junction. Interval removal of right chest tube. Endotracheal tube, NG tube, right PICC line, left IJ sheath left chest tube in stable position. No pneumothorax. Prior CABG. Stable cardiomegaly. Low lung volumes. A mild component of congestive heart failure cannot be excluded. Similar findings noted on prior exam P IMPRESSION: 1. Interim placement of left subclavian central line, its tip is at the cavoatrial junction. Interim removal right chest tube . No pneumothorax. 2. Remaining lines and tubes including left chest tube in stable position. 3. Prior CABG. Stable cardiomegaly. Low lung volumes. A a mild component congestive heart failure cannot be excluded . Similar findings noted on prior exam. Electronically Signed   By: [place]   On: 11/03/2015 09:58   Dg Chest Port 1 View  11/03/2015  CLINICAL DATA:  Status post CABG on October 27, 2015 persistent left lower lobe atelectasis or pneumonia. No pneumothorax or significant pleural effusion. EXAM: PORTABLE CHEST 1 VIEW COMPARISON:  Portable chest x-ray  of November 02, 2015 at 5:48 a.m. FINDINGS: There has been interval placement of an intra aortic balloon pump whose marker lies over the junction of the aortic arch with the proximal descending thoracic aorta. The cardiac silhouette remains enlarged. The pulmonary vascularity remains mildly prominent. The retrocardiac region on the left remains dense. Bilateral chest tubes are in stable position. There is no pneumothorax nor significant pleural effusion. The endotracheal tube tip lies approximately 6.5 cm above the carina. The esophagogastric tube tip projects below the inferior margin of the image. The right-sided PICC line tip projects over the midportion of the SVC. A previously demonstrated pericardial drain is not clearly evident on today's study. IMPRESSION: Mild CHF. Persistent left lower lobe atelectasis or pneumonia. Interval placement of an intra aortic balloon pump which appears to be in appropriate position radiographically. The other support tubes and lines are in reasonable position. Electronically Signed   By: David  Martinique M.D.   On: 11/03/2015 07:27   Dg Chest Port 1 View  11/02/2015  CLINICAL DATA:  CABG. EXAM: PORTABLE CHEST 1 VIEW COMPARISON:  11/01/2015. FINDINGS: Endotracheal tube, NG tube, right PICC line, bilateral chest tubes in stable position. Prior CABG. Stable cardiomegaly . Persistent mild interstitial prominence, congestive heart failure cannot be excluded. No interim change. Low lung volumes with basilar atelectasis . IMPRESSION: 1. Endotracheal tube, NG tube, right PICC line, bilateral chest tubes in stable position . No pneumothorax. 2. Prior CABG. Cardiomegaly with pulmonary interstitial prominence and small left pleural effusion consistent with mild congestive heart failure. No interim change from prior exam . Electronically Signed   By: Marcello Moores  Register   On: 11/02/2015 07:20   Dg Chest Port 1 View  11/01/2015  CLINICAL DATA:  Post CABG EXAM: PORTABLE CHEST 1 VIEW COMPARISON:   10/31/2015 FINDINGS: Cardiomediastinal silhouette is stable. Status post CABG. Stable endotracheal and NG tube position. Left chest tube is unchanged in position. Persistent central vascular congestion and mild interstitial prominence bilaterally suspicious for the mild interstitial edema. Left IJ sheath in place. Probable small left pleural effusion left basilar atelectasis or infiltrate. Right arm PICC line is unchanged in position. There is no pneumothorax. IMPRESSION: Status post CABG. Stable support apparatus. Stable left chest tube position. No pneumothorax. Again noted bilateral mild interstitial prominence and perihilar opacities suspicious for mild pulmonary edema. Probable small left pleural effusion left basilar atelectasis or infiltrate. Electronically Signed   By: Lahoma Crocker M.D.   On: 11/01/2015 09:57   Dg Chest Port 1 View  10/31/2015  CLINICAL DATA:  55 year old male with a history of endotracheal tube placement Patient has undergone coronary artery bypass grafting x4 10/27/2015. Placement of intra aortic balloon pump at this date. Ischemic cardiomyopathy. EXAM: PORTABLE CHEST 1 VIEW COMPARISON:  10/30/2015. FINDINGS: Endotracheal tube terminates approximately 3.4 cm above the carina, unchanged. Gastric tube terminates out of the field of view. Defibrillator pads project over the left and right chest. Bilateral thoracostomy tubes. Right upper extremity PICC. The mediastinal drain not visualized. Intra-aortic balloon pump not visualized. Surgical changes of median sternotomy and CABG. Mixed bilateral interstitial and airspace opacities. Low lung volumes. IMPRESSION: Low lung volumes with mixed bilateral interstitial and airspace opacities, similar to the comparison chest x-ray. Retrocardiac opacity may reflect persistent pleural fluid, and/or atelectasis. Surgical support apparatus appear unchanged, as above. Intra aortic balloon pump not visualized. Signed, Dulcy Fanny. Earleen Newport, DO [facility] Electronically Signed   By: Corrie Mckusick D.O.   On: 10/31/2015 07:25   Dg Chest Port 1 View  10/30/2015  CLINICAL DATA:  Post cavity.  Recent CABG. EXAM: PORTABLE CHEST 1 VIEW COMPARISON:  Portable film earlier today. FINDINGS: Overlying support apparatus redemonstrated. Tubes and lines remain stable. Elevated LEFT hemidiaphragm. No definite pneumothorax. Moderate vascular congestion may be increased. Cardiomegaly. IMPRESSION: Slight worsening aeration. Moderate vascular congestion may be increased. Electronically Signed   By: Staci Righter M.D.   On: 10/30/2015 18:39   Dg Chest Port 1 View  10/30/2015  CLINICAL DATA:  Status post coronary bypass grafting EXAM: PORTABLE CHEST 1 VIEW COMPARISON:  10/30/2015 FINDINGS: Cardiac shadow remains enlarged. Bilateral chest tubes are again seen. A right-sided PICC line and endotracheal tube are noted in satisfactory position. Left jugular sheath is noted. Elevation of left hemidiaphragm is noted with left basilar atelectasis. No pneumothorax is noted. IMPRESSION: Postsurgical change with tubes and lines as described. Mild left basilar atelectasis. Electronically Signed   By: Inez Catalina M.D.   On: 10/30/2015 07:45   Dg Chest Port 1 View  10/30/2015  CLINICAL DATA:  A chest tube in place EXAM: PORTABLE CHEST 1 VIEW COMPARISON:  10/30/2015 FINDINGS: Postoperative changes in the mediastinum. Endotracheal tube with tip measuring 6.3 cm about the carina. Right PICC catheter with tip over the cavoatrial junction. Left central venous catheter sheath with tip over the left neck consistent location in the internal jugular vein. Bilateral chest tubes. Infiltration or atelectasis in the right lung base. Cardiac enlargement. No significant vascular congestion. No pneumothorax. IMPRESSION: Appliance positioned as described. Cardiac enlargement with infiltration or atelectasis in the right lung base. Bilateral chest tubes  are present but no visualized pneumothorax. Electronically Signed   By: Lucienne Capers M.D.   On: 10/30/2015 03:07   Dg Chest Port 1 View  10/30/2015  CLINICAL DATA:  Shortness of breath.  Code blue. EXAM: PORTABLE CHEST 1 VIEW COMPARISON:  10/29/2015 FINDINGS: Postoperative changes in the mediastinum. Bilateral chest tubes are present. Right PICC line with tip over the cavoatrial junction. Left central venous catheter or sheath with tip over the left side of the neck, likely in the left internal jugular vein. Cardiac enlargement without significant vascular congestion. Shallow inspiration with elevation of the left hemidiaphragm. Probable left pleural effusion. No definite pulmonary consolidation. IMPRESSION: Shallow inspiration. Probable left pleural effusion. Cardiac enlargement. Appliances appear in satisfactory position. Electronically Signed   By: Lucienne Capers M.D.   On: 10/30/2015 01:18   Dg Chest Port 1 View  10/29/2015  CLINICAL DATA:  Postop  CABG 2 days ago. EXAM: PORTABLE CHEST 1 VIEW COMPARISON:  Portable chest x-ray of October 28, 2015 FINDINGS: The trachea and esophagus have been extubated. The cardiac silhouette remains enlarged. The pulmonary vascularity is mildly engorged. The bilateral chest tubes and the mediastinal drain are in stable position. There is no pneumothorax or large pleural effusion. The Swan-Ganz catheter tip overlies a proximal right lower lobe pulmonary artery branch. The mediastinum is widened and accentuated by the hypo inflation. IMPRESSION: Bilateral hypoinflation following extubation of the trachea there is crowding of the pulmonary vascularity. Left basilar atelectasis or less likely pneumonia is more prominent today. Probable small left pleural effusion. The remaining support tubes and lines are in stable position. Electronically Signed   By: David  Martinique M.D.   On: 10/29/2015 07:40   Dg Chest Port 1 View  10/28/2015  CLINICAL DATA:  Post CABG, on ventilator,  followup portable chest x-ray of 10/28/2015 EXAM: PORTABLE CHEST 1 VIEW COMPARISON:  None. FINDINGS: The tip of the endotracheal tube is approximately 5.2 cm above the carina. Aeration has improved slightly. Atelectasis remains at the lung bases left-greater-than-right. Swan-Ganz catheter tip is in the right lower lobe pulmonary artery and bilateral chest tubes remain. No pneumothorax is seen. Heart size is stable. IMPRESSION: 1. Improved aeration. 2. Bilateral chest tubes.  No pneumothorax. 3. Tip of endotracheal tube approximately 5.2 cm above the carina. Electronically Signed   By: Ivar Drape M.D.   On: 10/28/2015 08:01   Dg Chest Port 1 View  10/27/2015  CLINICAL DATA:  Status post CABG EXAM: PORTABLE CHEST 1 VIEW COMPARISON:  10/27/2015 FINDINGS: Unchanged tracheostomy tube. Swan-Ganz central venous line is now seen with tip advanced into the right perihilar region. Stable right chest tube with and left chest tube. NG tube again crosses the gastroesophageal junction. Stable mediastinal drain. Enlarged cardiac silhouette. Limited inspiratory effect with bibasilar opacities likely representing atelectasis. IMPRESSION: Anticipated postoperative appearance with bibasilar atelectasis. Electronically Signed   By: Skipper Cliche M.D.   On: 10/27/2015 19:25   Dg Chest Portable 1 View  10/27/2015  CLINICAL DATA:  Status post coronary bypass grafting EXAM: PORTABLE CHEST 1 VIEW COMPARISON:  10/23/2015 FINDINGS: Cardiac shadow is mildly enlarged. A Swan-Ganz catheter is noted in the right pulmonary outflow tract. Bilateral thoracostomy catheters are seen. No pneumothorax is noted. An endotracheal tube is noted in satisfactory position. Intra-aortic balloon pump is noted over the aortic arch. Mild left basilar atelectasis is seen. Mild central vascular congestion is noted. IMPRESSION: Postoperative changes with tubes and lines as described above. Mild vascular congestion and left basilar atelectasis. Electronically  Signed   By: Inez Catalina M.D.   On: 10/27/2015 17:23   Portable Chest X-ray 1 View  10/21/2015  CLINICAL DATA:  55 year old male with a history of acute systolic congestive heart failure EXAM: PORTABLE CHEST 1 VIEW COMPARISON:  10/20/2015 FINDINGS: Heart size unchanged, enlarged. Similar appearance of low lung volumes with interstitial opacities, interlobular septal thickening, and developing airspace disease of the bilateral lower lungs. No pneumothorax. Linear opacity overlying the mediastinum and the heart border, of on certain significance. Favored to overlie the patient. IMPRESSION: Evidence of worsening congestive heart failure, with increasing edema. New linear opacity overlies the mediastinum, favored to be overlying the patient, though cannot be localized on this view. Signed, Dulcy Fanny. Earleen Newport, DO [facility] Electronically Signed   By: Corrie Mckusick D.O.   On: 10/21/2015 07:16   Dg Chest Portable 1 View  10/20/2015  CLINICAL DATA:  Shortness of breath. EXAM: PORTABLE CHEST 1 VIEW COMPARISON:  None. FINDINGS: Midline trachea. Cardiomegaly accentuated by AP portable technique. No pleural effusion or pneumothorax. Mildly low lung volumes with bibasilar atelectasis. IMPRESSION: Mild cardiomegaly and low lung volumes.  No acute findings. Electronically Signed   By: Abigail Miyamoto M.D.   On: 10/20/2015 18:09   Dg Abd Portable 1v  11/19/2015  CLINICAL DATA:  Encounter for enteric feeding tube placement. EXAM: PORTABLE ABDOMEN - 1 VIEW COMPARISON:  11/18/2015 FINDINGS: Enteric feeding tube tip now projects in the fourth portion of the duodenum, near the ligament of Treitz. There is small bowel dilation which appears increased from the prior radiographs. IMPRESSION: Enteric feeding tube metallic tip now projects in the fourth portion of the duodenum near the ligament of Treitz. Increased small bowel dilation which may reflect an adynamic ileus or  partial obstruction this is only partly imaged on this exam. Electronically Signed   By: Lajean Manes M.D.   On: 11/19/2015 10:24   Dg Abd Portable 1v  11/18/2015  CLINICAL DATA:  55 year old male feeding tube placement. Initial encounter. EXAM: PORTABLE ABDOMEN - 1 VIEW COMPARISON:  11/16/2015. FINDINGS: Portable AP supine view at 1109 hours. A enteric tube placed, tip at the level of the distal second portion of the duodenum. Epicardial pacer wires remain in place. Increased gaseous distension of large bowel in the abdomen, but no dilated small bowel loops. Mild gaseous distension of the stomach. No definite pneumoperitoneum on this supine view. Stable visualized osseous structures. IMPRESSION: 1. Enteric tube placed, it tip at the mid duodenum. 2. Increased large bowel gas, favor ileus over mechanical obstruction. Electronically Signed   By: Genevie Ann M.D.   On: 11/18/2015 12:01   Dg Abd Portable 1v  11/16/2015  CLINICAL DATA:  Ileus, acute MI with CHF EXAM: PORTABLE ABDOMEN - 1 VIEW COMPARISON:  Portable abdominal films of November 15, 2015 FINDINGS: There remain small amounts of gas within small bowel loops as well as and large bowel loops. No abnormally distended bowel loops are observed. There is gas in the stomach. A feeding tube is in place whose tip appears to lie at the duodenal -jejunal junction. IMPRESSION: Fairly stable appearance of the abdomen with moderate amounts of gas within small and large bowel loops consistent with ileus. No free extraluminal gas collections are observed. Electronically Signed   By: David  Martinique M.D.   On: 11/16/2015 07:20   Dg Abd Portable 1v  11/15/2015  CLINICAL DATA:  Chronic ventilator dependent respiratory failure. Acute onset of nausea and vomiting. EXAM: PORTABLE ABDOMEN - 1 VIEW COMPARISON:  11/03/2015, 11/02/2015. FINDINGS: Feeding tube tip projects at the expected location of the 4th portion of the duodenum at or near the ligament Treitz. Nonobstructive bowel  gas pattern. Gas in normal caliber loops of small bowel throughout the abdomen and pelvis. Gas throughout normal caliber colon from cecum to rectum. No suggestion of free intraperitoneal air. IMPRESSION: 1. Bowel gas pattern indicating a mild generalized ileus. No evidence of bowel obstruction. 2. Feeding tube tip appropriately positioned in the 4th portion of the duodenum at or near the ligament of Treitz. Electronically Signed   By: Evangeline Dakin M.D.   On: 11/15/2015 07:26   Dg Abd Portable 1v  11/02/2015  CLINICAL DATA:  Assess feeding tube positioning EXAM: PORTABLE ABDOMEN - 1 VIEW COMPARISON:  None in PACs FINDINGS: The repeated KUB with has less motion artifact. The tip of the feeding  tube and nasogastric tube lie in the region of the distal gastric body in the pre-pyloric region. The bowel gas pattern is within the limits of normal. Numerous tubes and lines and electrodes overlie the abdomen. IMPRESSION: The tips of the feeding tube and the esophagogastric tube lie in the region of the distal gastric body in the pre-pyloric region. Electronically Signed   By: David  Martinique M.D.   On: 11/02/2015 12:16   Dg Addison Bailey G Tube Plc W/fl-no Rad  11/03/2015  CLINICAL DATA:  NASO G TUBE PLACEMENT WITH FLUORO Fluoroscopy was utilized by the requesting physician.  No radiographic interpretation.    PHYSICAL EXAM CVP 12-14 General: Sitting up in chair Neck: JVP hard to see.  no thyromegaly or thyroid nodule.  S/p Trach  Lungs: mild rhonchi   CV: Nondisplaced PMI.  Heart regular S1/S2, no S3/S4, no murmur. 2+ edema. Abdomen: Distended. Nontender. Good BS.   Neurologic: alert and oriented x 3 Extremities: No clubbing or cyanosis.  Dopplerable pedal pulses.   TELEMETRY: NSR 60s  ASSESSMENT AND PLAN:  1. CAD: S/p late presentation inferior MI and CABG x 4. Continue statin, ASA. Coronary angiography 3/27 with multiple VT episodes showed patent LIMA-LAD and SVG-RCA.  Patent SVG-ramus and SVG-D but slow  flow in both grafts with diffusely diseased small target vessels.  2. Cardiogenic shock: Acute systolic CHF with RV failure post inferior MI with suspected RV involvement. EF 25-30% on echo (difficult images on 3/24 echo). He has significant RV failure from suspected RV infarct.  3/27 IABP placed to try to allow decrease in milrinone with frequent VT.  3/31 echo with EF 30-35%.  IABP out 4/2.  Milrinone off 4/7 - Off milrinone. Co-ox borderline will repeat. - CVP remains up. Increase lasix to 40 iv bid. Place UNA boots. - No spiro yet with CKD.  - He is now off NO and on sildenafil 40 tid.   -Continue hydralazine to 50 mg tid for afterload reduction (no Imdur with sildenafil use).   3. Complete heart block: Pacemaker turned off 4/3.  Underlying NSR in 70s. Now in AFL. Plan TEE/DC-CV this week. Possibly tomorrow or Wednesday.  4. VF arrest 3/24 early am and again at night 3/24.  Only occasional PVCs today.  - Continue po amio 5. AKI: Creatinine stable 2.12 Suspect ATN. Baseline creatinine 1.0. Renal following 6. ID: Possible LLL PNA.  He completed course of  vancomycin/levofloxacin.  7. Pulmonary: S/P Trach 4/5 on trach collar trials. 8. Hypernatremia: Improved with D5w 9. AFL - Baack in NSR. On heparin and po amio.  10. Hypernatremia- Na coming down. 155>146.  11. Ileus - continue Reglan. Ambulate as tolerated.   Glori Bickers MD  11/19/2015 4:32 PM

## 2015-11-19 NOTE — Progress Notes (Signed)
Orthopedic Tech Progress Note Patient Details:  Christopher [name] 02-14-1961 [id number]  Ortho Devices Type of Ortho Device: Roland Rack boot Ortho Device/Splint Location: bilateral unna boot Ortho Device/Splint Interventions: Application   Saul Fordyce 11/19/2015, 2:19 PM

## 2015-11-19 NOTE — Progress Notes (Signed)
PULMONARY / CRITICAL CARE MEDICINE   Name: Christopher [name] MRN: [id number] DOB: July 09, 1961    ADMISSION DATE:  10/20/2015 CONSULTATION DATE:  11/03/2015  REFERRING MD: Dr. Donata Clay / CVTS  CHIEF COMPLAINT:  Acute Hypoxic Respiratory Failure  HISTORY OF PRESENT ILLNESS:   [name] w/ ischemic cardiomyopathy following MI. Patient underwent CABG & CEA. Post-operative course complicated by arrhythmia and biventricular failure, HCAP, heart block. Improving  SUBJECTIVE: Boody secretions from trach improving off heparin.   REVIEW OF SYSTEMS:  Feels well. Denies any nausea, vomiting No chest pain, palpitations No dyspnea, cough, wheezing.   VITAL SIGNS: BP 142/99 mmHg  Pulse 85  Temp(Src) 98.3 F (36.8 C) (Oral)  Resp 24  Ht 5' 10.5" (1.791 m)  Wt 236 lb 15.9 oz (107.5 kg)  BMI 33.51 kg/m2  SpO2 97%  HEMODYNAMICS: CVP:  [15 mmHg-21 mmHg] 15 mmHg  VENTILATOR SETTINGS: Vent Mode:  [-]  FiO2 (%):  [40 %-60 %] 60 %  INTAKE / OUTPUT: I/O last 3 completed shifts: In: 6534.2 [I.V.:4063.3; NG/GT:2370.8; IV Piggyback:100] Out: 3805 [Urine:3805]  PHYSICAL EXAMINATION: General:  Comfortable. Sitting up in chair Neuro:  Movers all 4 extremities. Grossly nonfocal. HEENT: Trach in place. No JVD, thyomegaly Cardiovascular:  Regular rate. No MRG Lungs:  Clear, no wheezing, crackles Abdomen:  Soft. Protuberant. Normal BS. Nontender. Integument:  No rash  LABS:  BMET  Recent Labs Lab 11/17/15 0416 11/18/15 0445 11/19/15 0400  NA 155* 146* 146*  K 4.3 4.1 3.8  CL 116* 111 110  CO2 BUN 95* 83* 64*  CREATININE 2.34* 2.12* 2.02*  GLUCOSE 107* 104* 94   Electrolytes  Recent Labs Lab 11/13/15 0430  11/17/15 0416 11/18/15 0445 11/19/15 0400  CALCIUM 9.3  < > 9.0 8.6* 9.0  MG 2.6*  < > 2.6* 2.3 2.1  PHOS 5.8*  --   --   --  4.5  < > = values in this interval not displayed. CBC  Recent Labs Lab 11/17/15 0416 11/18/15 0445 11/19/15 0400  WBC 9.7  10.2 11.5*  HGB 10.3* 9.6* 10.5*  HCT 35.4* 32.7* 35.5*  PLT 236 227 267   Coag's No results for input(s): APTT, INR in the last 168 hours.  Sepsis Markers No results for input(s): LATICACIDVEN, PROCALCITON, O2SATVEN in the last 168 hours. ABG  Recent Labs Lab 11/13/15 0526  PHART 7.419  PCO2ART 55.2*  PO2ART 68.0*    Liver Enzymes  Recent Labs Lab 11/13/15 0430 11/17/15 0416 11/19/15 0400  AST  --  50*  --   ALT  --  51  --   ALKPHOS  --  66  --   BILITOT  --  1.4*  --   ALBUMIN 2.6* 2.6* 2.7*    Cardiac Enzymes No results for input(s): TROPONINI, PROBNP in the last 168 hours.  Glucose  Recent Labs Lab 11/18/15 0749 11/18/15 1143 11/18/15 1532 11/18/15 1913 11/18/15 2340 11/19/15 0354  GLUCAP 110* 88 90 92 85 86   Imaging Dg Abd Portable 1v  11/18/2015  CLINICAL DATA:  55 year old male feeding tube placement. Initial encounter. EXAM: PORTABLE ABDOMEN - 1 VIEW COMPARISON:  11/16/2015. FINDINGS: Portable AP supine view at 1109 hours. A enteric tube placed, tip at the level of the distal second portion of the duodenum. Epicardial pacer wires remain in place. Increased gaseous distension of large bowel in the abdomen, but no dilated small bowel loops. Mild gaseous distension of the stomach. No definite pneumoperitoneum on  this supine view. Stable visualized osseous structures. IMPRESSION: 1. Enteric tube placed, it tip at the mid duodenum. 2. Increased large bowel gas, favor ileus over mechanical obstruction. Electronically Signed   By: Odessa Fleming M.D.   On: 11/18/2015 12:01    STUDIES:  CXR 4/11> images reviewed. No change from prior. Has LLL opacity, fluid.   MICROBIOLOGY: Tracheal Asp Ctx 3/27:  Oral Flora Urine Ctx 3/24:  Negative MRSA PCR 3/20:  Negative MRSA PCR 3/14:  Negative C diff 4/10 > Negative  ANTIBIOTICS: Zosyn 3/29 - 4/1 Vancomcyin 3/21 - 3/22; 3/29 - 4/7 Cefuroxime 3/21 - 3/23 (periop prophylaxis) Elita Quick 3/27 - 3/29 Levaquin 4/1 -  4/7 Flagyl 4/9 >> 4/11  SIGNIFICANT EVENTS: 3/14 - Admit 3/14 - LHC 3/21 - CABG 3/27 - IABP replaced >> 4/1 3/28 - Chest Tubes removed. RHC & LHC.  LINES/TUBES: Trach 6.0 (JY) 4/5 >>  L [place] CVL 3/28>>> R PICC 3/23>>> R NJT>>> Foley>>> IABP 3/27 - 4/1 OETT 7.5 Extubated 3/22; 3.24 - 4/5  ASSESSMENT / PLAN:  PULMONARY A: Acute Hypoxic Respiratory Failure - Improving. Chronic Hypercarbic Respiratory Failure - OSA/OHS, COPD. Chronic hypoxemic resp failure - cardiogenic pulmonary edema  P:   Continue trach collar.Vent on standby. Startingsir- muir valve trials. On Xopenex q6hr.  CARDIOVASCULAR A:  S/P CABG & CEA Left Ventricular Heart Failure post MI Cardiogenic Shock Periop Complete Heart Block & Post-op V Tach H/O HTN  P:  Management per CVTS & Cardiology. ASA VT Lipitor VT Amiodarone gtt. Changed to PO Heparin gtt > To be stopped today because of bloody secretions. Revatio VT tid Resume Lasix IV   RENAL A:   Acute Renal Failure - Resolving. Hypoklalemia - Resolved. Hypernatremia - Worsening P:   Trending UOP with Foley Electrolytes & Renal Function daily Free water. D/C D5W.   GASTROINTESTINAL A:   Transaminitis - Likely shock liver/hepatic congestion. Ileus  P:   Tolerating tube feeds.  HEMATOLOGIC A:   Anemia - Hgb stable.  Leukocytosis - Mild.  P:  Transfusion PRBC as needed Daily CBC  INFECTIOUS A:   Possible HCAP - Ctx w/ Oral Flora.  P:   Completed 10 day course of antibiotics. Off flagyl as C.diff is negative.  Recheck sputum cultures  ENDOCRINE CBG (last 3)  A:   Hyperglycemia - BG controlled.  P:   Continue Levemir bid Accu-checks q4hr SSI per algorithm  NEUROLOGIC A:   Sedation on Ventilator Post-op Pain Control   P:   RASS goal: 0 Versed IV prn Fentanyl IV prn Seroquel qhs  FAMILY UPDATES: Pt and significant other updated at bedside 4/12.  TODAY'S SUMMARY:  55 year old male s/p CABG & Right CEA with  complicated post-op course. Patient's renal function slowly improving. Tolerating tracheostomy collar. Ileus  Improving Tolerating tube feeds.  Critical care time- 35 mins.  Chilton Greathouse MD [facility] Pager [phone] If no answer or after 3pm call: [phone] 11/19/2015, 8:45 AM

## 2015-11-19 NOTE — Procedures (Signed)
Objective Swallowing Evaluation: Type of Study: MBS-Modified Barium Swallow Study  Patient Details  Name: Christopher [name] MRN: [id number] Date of Birth: May 08, 1961  Today's Date: 11/19/2015 Time: SLP Start Time (ACUTE ONLY): 1308-SLP Stop Time (ACUTE ONLY): 1329 SLP Time Calculation (min) (ACUTE ONLY): 21 min  Past Medical History:  Past Medical History  Diagnosis Date  . Hypertension   . TIA (transient ischemic attack)   . Stenosis of right carotid artery   . Complete heart block (HCC) 10/20/2015   Past Surgical History:  Past Surgical History  Procedure Laterality Date  . Arm surgery Right 2003  . Eye surgery    . Laceration repair    . Cardiac catheterization N/A 10/20/2015    Procedure: Left Heart Cath and Coronary Angiography;  Surgeon: Tonny Bollman, MD;  Location: [facility] INVASIVE CV LAB;  Service: Cardiovascular;  Laterality: N/A;  . Coronary artery bypass graft N/A 10/27/2015    Procedure: CORONARY ARTERY BYPASS GRAFTING (CABG) x four, using left internal mammary artery and rt leg greater saphenous vein harvested endoscopically;  Surgeon: Kerin Perna, MD;  Location: [facility] OR;  Service: Open Heart Surgery;  Laterality: N/A;  . Tee without cardioversion N/A 10/27/2015    Procedure: TRANSESOPHAGEAL ECHOCARDIOGRAM (TEE);  Surgeon: Kerin Perna, MD;  Location: [facility] OR;  Service: Open Heart Surgery;  Laterality: N/A;  . Endarterectomy Right 10/27/2015    Procedure: RIGHT ENDARTERECTOMY CAROTID with patch angioplasty using Xenosure bovine pericardium patch;  Surgeon: Fransisco Hertz, MD;  Location: [facility] OR;  Service: Vascular;  Laterality: Right;  . Cardiac catheterization N/A 11/02/2015    Procedure: Right/Left Heart Cath and Coronary/Graft Angiography;  Surgeon: Laurey Morale, MD;  Location: [facility] INVASIVE CV LAB;  Service: Cardiovascular;  Laterality: N/A;   HPI: 55 y.o. male w/ ischemic cardiomyopathy following MI s/p CABG & CEA. Post-operative course complicated by arrhythmia and  biventricular failure, HCAP, heart block. PMHx HTN, stenosis of R carotid artery, TIA. Intubated 3/21-3/22, re-intubated 3/26 and failed to wean, trach placed 4/6.  Subjective: pt alert, eager for water  Assessment / Plan / Recommendation  CHL IP CLINICAL IMPRESSIONS 11/19/2015  Therapy Diagnosis Severe pharyngeal phase dysphagia  Clinical Impression MBS complete. PMSV not worn due to inability to tolerate long enough for intake. Pt exhibited severe pharyngeal phase dysphagia characterized by both sensory and motor impairments. Delayed swallow initiation to the pyriform sinuses resulted in aspiration intermittently sensed before and during the swallow. Max pharyngeal residue in the vallecula and pyriform sinsues resulted in aspiration after the swallow. SLP provided min verbal cues to cough and dry swallow to reduce aspirates as well as pharyngeal residue, however ineffective to clear due to absent subglottal pressure. Pt and significant other educated re: continued NPO status and f/u by SLP to determine PO readiness. Pt is not safe for any PO intake, including meds.  Impact on safety and function Severe aspiration risk      CHL IP TREATMENT RECOMMENDATION 11/19/2015  Treatment Recommendations Therapy as outlined in treatment plan below     Prognosis 11/19/2015  Prognosis for Safe Diet Advancement Good  Barriers to Reach Goals Severity of deficits  Barriers/Prognosis Comment --    CHL IP DIET RECOMMENDATION 11/19/2015  SLP Diet Recommendations NPO  Liquid Administration via --  Medication Administration Via alternative means  Compensations --  Postural Changes --      CHL IP OTHER RECOMMENDATIONS 11/19/2015  Recommended Consults --  Oral Care Recommendations Oral care QID  Other Recommendations --  CHL IP FOLLOW UP RECOMMENDATIONS 11/19/2015  Follow up Recommendations Inpatient Rehab      CHL IP FREQUENCY AND DURATION 11/19/2015  Speech Therapy Frequency (ACUTE ONLY) min 2x/week   Treatment Duration 2 weeks           CHL IP ORAL PHASE 11/19/2015  Oral Phase WFL  Oral - Pudding Teaspoon --  Oral - Pudding Cup --  Oral - Honey Teaspoon --  Oral - Honey Cup --  Oral - Nectar Teaspoon --  Oral - Nectar Cup --  Oral - Nectar Straw --  Oral - Thin Teaspoon --  Oral - Thin Cup --  Oral - Thin Straw --  Oral - Puree --  Oral - Mech Soft --  Oral - Regular --  Oral - Multi-Consistency --  Oral - Pill --  Oral Phase - Comment --    CHL IP PHARYNGEAL PHASE 11/19/2015  Pharyngeal Phase Impaired  Pharyngeal- Pudding Teaspoon --  Pharyngeal --  Pharyngeal- Pudding Cup --  Pharyngeal --  Pharyngeal- Honey Teaspoon --  Pharyngeal --  Pharyngeal- Honey Cup --  Pharyngeal --  Pharyngeal- Nectar Teaspoon --  Pharyngeal --  Pharyngeal- Nectar Cup Delayed swallow initiation-pyriform sinuses;Reduced epiglottic inversion;Reduced anterior laryngeal mobility;Reduced laryngeal elevation;Reduced airway/laryngeal closure;Reduced tongue base retraction;Moderate aspiration;Penetration/Aspiration during swallow;Penetration/Apiration after swallow;Pharyngeal residue - valleculae;Pharyngeal residue - pyriform  Pharyngeal Material enters airway, passes BELOW cords without attempt by patient to eject out (silent aspiration)  Pharyngeal- Nectar Straw --  Pharyngeal --  Pharyngeal- Thin Teaspoon --  Pharyngeal --  Pharyngeal- Thin Cup Delayed swallow initiation-pyriform sinuses;Reduced epiglottic inversion;Reduced anterior laryngeal mobility;Reduced laryngeal elevation;Reduced airway/laryngeal closure;Reduced tongue base retraction;Penetration/Apiration after swallow;Penetration/Aspiration during swallow;Moderate aspiration;Pharyngeal residue - valleculae;Pharyngeal residue - pyriform  Pharyngeal Material enters airway, passes BELOW cords without attempt by patient to eject out (silent aspiration)  Pharyngeal- Thin Straw --  Pharyngeal --  Pharyngeal- Puree --  Pharyngeal --   Pharyngeal- Mechanical Soft --  Pharyngeal --  Pharyngeal- Regular --  Pharyngeal --  Pharyngeal- Multi-consistency --  Pharyngeal --  Pharyngeal- Pill --  Pharyngeal --  Pharyngeal Comment --     CHL IP CERVICAL ESOPHAGEAL PHASE 11/19/2015  Cervical Esophageal Phase WFL  Pudding Teaspoon --  Pudding Cup --  Honey Teaspoon --  Honey Cup --  Nectar Teaspoon --  Nectar Cup --  Nectar Straw --  Thin Teaspoon --  Thin Cup --  Thin Straw --  Puree --  Mechanical Soft --  Regular --  Multi-consistency --  Pill --  Cervical Esophageal Comment --    No flowsheet data found.  Blenda Mounts Laurice 11/19/2015, 1:55 PM

## 2015-11-20 ENCOUNTER — Inpatient Hospital Stay (HOSPITAL_COMMUNITY): Payer: BLUE CROSS/BLUE SHIELD

## 2015-11-20 LAB — BASIC METABOLIC PANEL
ANION GAP: 9 (ref 5–15)
BUN: 58 mg/dL — ABNORMAL HIGH (ref 6–20)
CALCIUM: 8.6 mg/dL — AB (ref 8.9–10.3)
CO2: 26 mmol/L (ref 22–32)
Chloride: 114 mmol/L — ABNORMAL HIGH (ref 101–111)
Creatinine, Ser: 1.73 mg/dL — ABNORMAL HIGH (ref 0.61–1.24)
GFR calc Af Amer: 50 mL/min — ABNORMAL LOW (ref >60)
GFR calc non Af Amer: 43 mL/min — ABNORMAL LOW (ref >60)
GLUCOSE: 136 mg/dL — AB (ref 65–99)
POTASSIUM: 4.3 mmol/L (ref 3.5–5.1)
Sodium: 149 mmol/L — ABNORMAL HIGH (ref 135–145)

## 2015-11-20 LAB — CBC
HCT: 34.3 % — ABNORMAL LOW (ref 39.0–52.0)
Hemoglobin: 10.3 g/dL — ABNORMAL LOW (ref 13.0–17.0)
MCH: 27.5 pg (ref 26.0–34.0)
MCHC: 30 g/dL (ref 30.0–36.0)
MCV: 91.5 fL (ref 78.0–100.0)
Platelets: 215 10*3/uL (ref 150–400)
RBC: 3.75 MIL/uL — ABNORMAL LOW (ref 4.22–5.81)
RDW: 17.5 % — ABNORMAL HIGH (ref 11.5–15.5)
WBC: 9.9 10*3/uL (ref 4.0–10.5)

## 2015-11-20 LAB — GLUCOSE, CAPILLARY
GLUCOSE-CAPILLARY: 120 mg/dL — AB (ref 65–99)
GLUCOSE-CAPILLARY: 118 mg/dL — AB (ref 65–99)
GLUCOSE-CAPILLARY: 108 mg/dL — AB (ref 65–99)
Glucose-Capillary: 108 mg/dL — ABNORMAL HIGH (ref 65–99)
Glucose-Capillary: 98 mg/dL (ref 65–99)

## 2015-11-20 LAB — CARBOXYHEMOGLOBIN
Carboxyhemoglobin: 1.3 % (ref 0.5–1.5)
Methemoglobin: 0.8 % (ref 0.0–1.5)
O2 Saturation: 70.7 %
Total hemoglobin: 10.8 g/dL — ABNORMAL LOW (ref 13.5–18.0)

## 2015-11-20 LAB — MAGNESIUM: Magnesium: 2.1 mg/dL (ref 1.7–2.4)

## 2015-11-20 MED ORDER — FREE WATER
400.0000 mL | Status: DC
  Administered 2015-11-20 – 2015-11-21 (×6): 400 mL

## 2015-11-20 NOTE — Evaluation (Addendum)
Speech Pathology Treatment Note Patient Details  Name: Christopher [name] MRN: [id number] Date of Birth: 1961/07/15  Today's Date: 11/20/2015 Time: SLP Start Time (ACUTE ONLY): 1110 SLP Stop Time (ACUTE ONLY): 1132 SLP Time Calculation (min) (ACUTE ONLY): 22 min  Past Medical History:  Past Medical History  Diagnosis Date  . Hypertension   . TIA (transient ischemic attack)   . Stenosis of right carotid artery   . Complete heart block (HCC) 10/20/2015   Past Surgical History:  Past Surgical History  Procedure Laterality Date  . Arm surgery Right 2003  . Eye surgery    . Laceration repair    . Cardiac catheterization N/A 10/20/2015    Procedure: Left Heart Cath and Coronary Angiography;  Surgeon: Tonny Bollman, MD;  Location: [facility] INVASIVE CV LAB;  Service: Cardiovascular;  Laterality: N/A;  . Coronary artery bypass graft N/A 10/27/2015    Procedure: CORONARY ARTERY BYPASS GRAFTING (CABG) x four, using left internal mammary artery and rt leg greater saphenous vein harvested endoscopically;  Surgeon: Kerin Perna, MD;  Location: [facility] OR;  Service: Open Heart Surgery;  Laterality: N/A;  . Tee without cardioversion N/A 10/27/2015    Procedure: TRANSESOPHAGEAL ECHOCARDIOGRAM (TEE);  Surgeon: Kerin Perna, MD;  Location: [facility] OR;  Service: Open Heart Surgery;  Laterality: N/A;  . Endarterectomy Right 10/27/2015    Procedure: RIGHT ENDARTERECTOMY CAROTID with patch angioplasty using Xenosure bovine pericardium patch;  Surgeon: Fransisco Hertz, MD;  Location: [facility] OR;  Service: Vascular;  Laterality: Right;  . Cardiac catheterization N/A 11/02/2015    Procedure: Right/Left Heart Cath and Coronary/Graft Angiography;  Surgeon: Laurey Morale, MD;  Location: [facility] INVASIVE CV LAB;  Service: Cardiovascular;  Laterality: N/A;   HPI:  55 y.o. male w/ ischemic cardiomyopathy following MI s/p CABG & CEA. Post-operative course complicated by arrhythmia and biventricular failure, HCAP, heart block. PMHx HTN,  stenosis of R carotid artery, TIA. Intubated 3/21-3/22, re-intubated 3/26 and failed to wean, trach placed 4/6.   Assessment / Plan / Recommendation Clinical Impression   Pt demonstrates improved tolerance of PMSV; able to wear valve for 20 minutes with periodic removal to check for air trapping, which was not present. Pt reported no distress and vocal quality was clear. Pt had very good breath support with effort. Girlfriend reports that secretions are much better today, which was likely playing a role in poor PMSV tolerance. Pt may weak valve with full/very close intermittent supervision from RN. Reinforced basic precautions with pt and girlfriend and verbalized that only the RN may place the valve.  SLP also provided trials of ice chips x10 with cues for effortful swallows x3 followed by a thraot clear and swallow. Pt did very well with these instructions. Given that pts subglottic pressure is restored for airway protection, suspect he would have better success on repeat MBS. Encouraged pt to continue effortful swallow with ice today and SLP will consider repeat MBS tomorrow or Monday. Pt in agreement.     Aspiration Risk    Severe   Diet Recommendation   NPO  Medication Administration: Via alternative means    Other  Recommendations Oral Care Recommendations: Oral care QID   Follow up Recommendations  Inpatient Rehab    Frequency and Duration   2x week         Prognosis   Good     Swallow Study   General HPI: 55 y.o. male w/ ischemic cardiomyopathy following MI s/p CABG & CEA. Post-operative course complicated  by arrhythmia and biventricular failure, HCAP, heart block. PMHx HTN, stenosis of R carotid artery, TIA. Intubated 3/21-3/22, re-intubated 3/26 and failed to wean, trach placed 4/6. Temperature Spikes Noted: No Trach Size and Type: #6;Cuff;Deflated;With PMSV in place Oral Cavity - Dentition: Missing dentition;Other (Comment);Dentures, not available                       Harlon Ditty, MA CCC-SLP [phone]     Claudine Mouton 11/20/2015,12:32 PM

## 2015-11-20 NOTE — Progress Notes (Signed)
RT NOTE:  PT changed back to ATC. Pt tolerating well. RT will monitor

## 2015-11-20 NOTE — Clinical Social Work Note (Signed)
Consult for SNF alternative to CIR received. CSW will leave report for weekend CSW.   Roddie Mc MSW, [place], [place], [phone]

## 2015-11-20 NOTE — Progress Notes (Signed)
Rehab admissions - I have not heard back from [facility] regarding potential inpatient rehab admission.  Not medically ready for rehab yet.  I will check back on Monday for progress.  Call me for questions.  #[phone]

## 2015-11-20 NOTE — Progress Notes (Signed)
Noted that sodium and BUN up after water(IV and PO)  was decreased and lasix added back per cards. I will increase his free water via tube and follow labs tomorrow  Audreanna Torrisi A

## 2015-11-20 NOTE — Progress Notes (Signed)
18 Days Post-Op Procedure(s) (LRB): Right/Left Heart Cath and Coronary/Graft Angiography (N/A) Intra-Aortic Balloon Pump Insertion Subjective: Pulmonary status worse last night and went back on vent -after aspirating thin barium during swallow eval  Now back on trach collar Am CXR with increased atelectasis/effusion L base Tracheal aspirate negative- off antibiotics WBC nl No more tracheal bleedeing after heparin stopped  NSR Receiving diuretics per cards/HF for ischemic CM  Objective: Vital signs in last 24 hours: Temp:  [97.8 F (36.6 C)-99.6 F (37.6 C)] 97.9 F (36.6 C) (04/14 1204) Pulse Rate:  [81-93] 88 (04/14 1151) Cardiac Rhythm:  [-] Normal sinus rhythm (04/14 0008) Resp:  [11-27] 21 (04/14 1307) BP: (92-127)/(53-87) 115/80 mmHg (04/14 1307) SpO2:  [90 %-99 %] 96 % (04/14 1151) FiO2 (%):  [40 %-80 %] 80 % (04/14 1151) Weight:  [235 lb 7.2 oz (106.8 kg)] 235 lb 7.2 oz (106.8 kg) (04/14 0500)  Hemodynamic parameters for last 24 hours: CVP:  [14 mmHg-85 mmHg] 67 mmHg  Intake/Output from previous day: 04/13 0701 - 04/14 0700 In: 2670.8 [I.V.:1439.2; NG/GT:1131.7; IV Piggyback:100] Out: 3025 [Urine:3025] Intake/Output this shift: Total I/O In: 100 [I.V.:50; NG/GT:50] Out: 1625 [Urine:1625]  Heavy trach secretions Anxious abd soft Feeding tube in place-post pyloric  Lab Results:  Recent Labs  11/19/15 0400 11/19/15 1742 11/20/15 0445  WBC 11.5*  --  9.9  HGB 10.5* 11.9* 10.3*  HCT 35.5* 35.0* 34.3*  PLT 267  --  215   BMET:  Recent Labs  11/19/15 0400 11/19/15 1742 11/20/15 0445  NA 146* 147* 149*  K 3.8 4.3 4.3  CL 110 110 114*  CO2 24  --  26  GLUCOSE 94 129* 136*  BUN 64* 52* 58*  CREATININE 2.02* 1.90* 1.73*  CALCIUM 9.0  --  8.6*    PT/INR: No results for input(s): LABPROT, INR in the last 72 hours. ABG    Component Value Date/Time   PHART 7.496* 11/19/2015 1616   HCO3 24.0 11/19/2015 1616   TCO2 23 11/19/2015 1742   ACIDBASEDEF  TEST WILL BE CREDITED 11/01/2015 0446   O2SAT 70.7 11/20/2015 0441   CBG (last 3)   Recent Labs  11/19/15 2357 11/20/15 0344 11/20/15 0748  GLUCAP 131* 120* 118*    Assessment/Plan: S/P Procedure(s) (LRB): Right/Left Heart Cath and Coronary/Graft Angiography (N/A) Intra-Aortic Balloon Pump Insertion Cont trach cillar as tol Cont ambulation Cont TF until swallow fx improved   LOS: 31 days    Kathlee Nations Trigt III 11/20/2015

## 2015-11-20 NOTE — Progress Notes (Signed)
Patient ID: Christopher [name], male   DOB: January 15, 1961, 55 y.o.   MRN: [id number]   ADVANCED HF ROUNDING NOTE  Subjective:  Continues to improve. No further bleeding from trach.  Feeling stronger. Remains in NSR. Ambulating unit.  Serum sodium back up. Renal function improved. CVP still up (15). Renal adjusting H2O  Co-ox 71%    Scheduled Meds: . amiodarone  200 mg Oral BID  . antiseptic oral rinse  7 mL Mouth Rinse QID  . aspirin  81 mg Oral Daily  . atorvastatin  80 mg Oral q1800  . chlorhexidine gluconate (SAGE KIT)  15 mL Mouth Rinse BID  . enoxaparin (LOVENOX) injection  40 mg Subcutaneous Q24H  . feeding supplement (PRO-STAT SUGAR FREE 64)  30 mL Per Tube TID  . free water  400 mL Per Tube Q4H  . furosemide  40 mg Intravenous BID  . hydrALAZINE  25 mg Oral 3 times per day  . insulin aspart  0-24 Units Subcutaneous 6 times per day  . insulin detemir  12 Units Subcutaneous BID  . levalbuterol  1.25 mg Nebulization TID  . pantoprazole sodium  40 mg Per Tube Daily  . potassium chloride  40 mEq Oral 3 times per day  . QUEtiapine  50 mg Oral QHS  . sildenafil  40 mg Oral TID  . silver sulfADIAZINE   Topical BID  . sodium chloride flush  10-40 mL Intracatheter Q12H  . triamcinolone cream   Topical BID   Continuous Infusions: . sodium chloride Stopped (11/15/15 0600)  . sodium chloride 50 mL/hr (11/20/15 1203)  . dextrose 50 mL/hr at 11/19/15 1900  . feeding supplement (VITAL 1.5 CAL) 1,000 mL (11/20/15 1203)  . feeding supplement (VITAL HIGH PROTEIN) Stopped (11/18/15 1015)   PRN Meds:.diphenhydrAMINE, fentaNYL (SUBLIMAZE) injection, levalbuterol, midazolam, ondansetron (ZOFRAN) IV, sodium chloride flush, traMADol    Filed Vitals:   11/20/15 0600 11/20/15 0734 11/20/15 0751 11/20/15 1204  BP: 104/79 119/82    Pulse: 84 86    Temp:   97.8 F (36.6 C) 97.9 F (36.6 C)  TempSrc:   Axillary Oral  Resp: 21 18    Height:      Weight:      SpO2: 99% 97%       Intake/Output Summary (Last 24 hours) at 11/20/15 1247 Last data filed at 11/20/15 1200  Gross per 24 hour  Intake 2160.83 ml  Output   3275 ml  Net -1114.17 ml    LABS: Basic Metabolic Panel:  Recent Labs  11/19/15 0400 11/19/15 1742 11/20/15 0445  NA 146* 147* 149*  K 3.8 4.3 4.3  CL 110 110 114*  CO2 24  --  26  GLUCOSE 94 129* 136*  BUN 64* 52* 58*  CREATININE 2.02* 1.90* 1.73*  CALCIUM 9.0  --  8.6*  MG 2.1  --  2.1  PHOS 4.5  --   --    Liver Function Tests:  Recent Labs  11/19/15 0400  ALBUMIN 2.7*   No results for input(s): LIPASE, AMYLASE in the last 72 hours. CBC:  Recent Labs  11/19/15 0400 11/19/15 1742 11/20/15 0445  WBC 11.5*  --  9.9  HGB 10.5* 11.9* 10.3*  HCT 35.5* 35.0* 34.3*  MCV 92.4  --  91.5  PLT 267  --  215   Cardiac Enzymes: No results for input(s): CKTOTAL, CKMB, CKMBINDEX, TROPONINI in the last 72 hours. BNP: Invalid input(s): POCBNP D-Dimer: No results for input(s): DDIMER in the  last 72 hours. Hemoglobin A1C: No results for input(s): HGBA1C in the last 72 hours. Fasting Lipid Panel: No results for input(s): CHOL, HDL, LDLCALC, TRIG, CHOLHDL, LDLDIRECT in the last 72 hours. Thyroid Function Tests: No results for input(s): TSH, T4TOTAL, T3FREE, THYROIDAB in the last 72 hours.  Invalid input(s): FREET3 Anemia Panel: No results for input(s): VITAMINB12, FOLATE, FERRITIN, TIBC, IRON, RETICCTPCT in the last 72 hours.  RADIOLOGY: Dg Chest 2 View  10/23/2015  CLINICAL DATA:  CHF.  Weakness. EXAM: CHEST  2 VIEW COMPARISON:  10/21/2015 FINDINGS: Multiple interstitial densities in the lower chest are suggestive for interstitial edema, right side greater than left. Heart size is upper limits of normal. Trachea is midline. Negative for a pneumothorax. Evidence for a small right pleural effusion. IMPRESSION: Interstitial densities in the lower chest, right side greater than left. Findings are suggestive for interstitial pulmonary  edema. Difficult to exclude some airspace disease in the right lower lung. Evidence for small right pleural effusion. Electronically Signed   By: Markus Daft M.D.   On: 10/23/2015 08:27   Dg Abd 1 View  11/03/2015  CLINICAL DATA:  Enteric tube advanced to post pyloric position. EXAM: ABDOMEN - 1 VIEW COMPARISON:  11/02/2015 abdominal radiographs. FINDINGS: Fluoroscopy time 7 minutes. Enteric tube courses through the stomach and duodenum and appears to terminate near the duodenal jejunal junction. Instilled contrast opacifies nondilated proximal jejunal small bowel loops. No contrast leak is demonstrated. Separate enteric tube terminates in the body of the stomach. Visualized lower sternotomy wires appear aligned and intact. IMPRESSION: Enteric tube appears to terminate near the duodenal jejunal junction. Separate enteric tube terminates in the body of the stomach. Electronically Signed   By: Ilona Sorrel M.D.   On: 11/03/2015 17:02   Dg Chest Port 1 View  11/20/2015  CLINICAL DATA:  Shortness of breath. EXAM: PORTABLE CHEST 1 VIEW COMPARISON:  11/18/2015 . FINDINGS: Tracheostomy tube, right PICC line, feeding tube in stable position. Prior CABG. Cardiomegaly. Worsening left lung infiltrate. Worsening left pleural effusion. Interim partial clearing of right base atelectasis and or infiltrate . No pneumothorax. IMPRESSION: 1. Lines and tubes in stable position. 2. Progressive left lung infiltrate consistent with pneumonia. Progressive left pleural effusion. 3. Interim partial clearing of right base atelectasis and/or infiltrate. 4.  Prior CABG.  Stable severe cardiomegaly. Electronically Signed   By: Marcello Moores  Register   On: 11/20/2015 07:36   Dg Chest Port 1 View  11/18/2015  CLINICAL DATA:  Respiratory failure. EXAM: PORTABLE CHEST 1 VIEW COMPARISON:  11/17/2015. FINDINGS: Tracheostomy tube, feeding tube, right PICC line, left subclavian line in stable position. Prior CABG. Stable cardiomegaly. Persistent left  lower lobe infiltrate/edema again noted. Right base atelectasis and/or infiltrate/edema noted. Small left pleural effusion . These changes may be related to bibasilar pneumonia and/or congestive heart failure . The thorax. IMPRESSION: 1. Lines and tubes in stable position . 2.  Prior CABG.  Persistent cardiomegaly. 3. Persistent left lower lobe infiltrate and small left pleural effusion. Developing right lower lobe infiltrate. These changes may be related congestive heart failure with pulmonary edema. Bilateral pneumonia could also present in this fashion. Electronically Signed   By: Marcello Moores  Register   On: 11/18/2015 07:41   Dg Chest Port 1 View  11/17/2015  CLINICAL DATA:  Shortness of breath. EXAM: PORTABLE CHEST 1 VIEW COMPARISON:  11/16/2015. FINDINGS: Tracheostomy tube, feeding tube, right PICC line, left subclavian central line in stable position. Prior CABG. Cardiomegaly. Left lower lobe infiltrate a small left  pleural effusion. Mild right base infiltrate. No pneumothorax. IMPRESSION: 1.  Lines and tubes in stable position. 2. Persistent left lower lobe infiltrate and left pleural effusion. No interim change. Mild right lower lobe infiltrate. No change from prior exam . 3.  Stable cardiomegaly. Electronically Signed   By: Marcello Moores  Register   On: 11/17/2015 07:19   Dg Chest Port 1 View  11/16/2015  CLINICAL DATA:  Follow-up respiratory failure EXAM: PORTABLE CHEST 1 VIEW COMPARISON:  11/15/2014 FINDINGS: Prior CABG. Tracheostomy tube, right PICC line and left subclavian central line remain in place, unchanged. There is cardiomegaly with vascular congestion. Continued left lower lobe atelectasis or pneumonia and suspect small left pleural effusion. No significant change since prior study. No confluent opacity on the right. IMPRESSION: Stable left lower lobe atelectasis or pneumonia with small left effusion. Stable cardiomegaly with vascular congestion. Electronically Signed   By: Rolm Baptise M.D.   On:  11/16/2015 07:27   Dg Chest Port 1 View  11/15/2015  CLINICAL DATA:  Chronic ventilator dependent respiratory failure. CABG and right carotid endarterectomy 10/27/2015. EXAM: PORTABLE CHEST 1 VIEW COMPARISON:  11/14/2015 and earlier. FINDINGS: Tracheostomy tube tip in satisfactory position below the thoracic inlet. Left subclavian central venous catheter tip projects over the mid SVC, unchanged. Right arm PICC tip projects over lower SVC at or near the cavoatrial junction, unchanged. Feeding tube courses below the diaphragm into the stomach. Sternotomy for CABG. Cardiac silhouette moderately enlarged. Mild pulmonary venous hypertension without overt edema, unchanged. Dense consolidation in the left lower lobe with air bronchograms, unchanged. Moderate-sized left pleural effusion, unchanged. No new pulmonary parenchymal abnormalities. IMPRESSION: 1.  Support apparatus satisfactory. 2. Stable dense left lower lobe atelectasis and/or pneumonia and left pleural effusion. 3. Stable cardiomegaly and pulmonary venous hypertension without overt edema. 4. No new abnormalities. Electronically Signed   By: Evangeline Dakin M.D.   On: 11/15/2015 07:22   Dg Chest Port 1 View  11/14/2015  CLINICAL DATA:  55 year old male with a history of shortness of breath. Patient admission 10/20/2015. Right carotid endarterectomy 10/27/2015, with same-day coronary artery bypass grafting 10/27/2015. Twelve days postop now with history of intra-aortic balloon pump. EXAM: PORTABLE CHEST 1 VIEW COMPARISON:  11/13/2015, 11/12/2015, 11/11/2015 FINDINGS: Cardiomediastinal silhouette likely unchanged, with the left heart border partially obscured by overlying lung and pleural disease. Low lung volumes persist with bibasilar opacities and obscuration of the retrocardiac region. Obscuration of left hemidiaphragm. Unchanged tracheostomy tube, enteric feeding tube terminating out of the field of view, right upper extremity PICC and left subclavian  central line. IMPRESSION: Similar appearance of the chest x-ray, with persisting bibasilar airspace opacities and left pleural effusion. Unchanged support apparatus as above. Signed, Dulcy Fanny. Earleen Newport, DO [facility] Electronically Signed   By: Corrie Mckusick D.O.   On: 11/14/2015 08:48   Dg Chest Port 1 View  11/13/2015  CLINICAL DATA:  Shortness of breath. EXAM: PORTABLE CHEST 1 VIEW COMPARISON:  November 12, 2015. FINDINGS: Stable cardiomegaly. Status post coronary bypass graft. Tracheostomy and feeding tube are unchanged. Bilateral subclavian catheters are unchanged. No pneumothorax is noted. Stable bibasilar opacities are noted with left greater than right. Stable left pleural effusion is noted. Bony thorax is unremarkable. IMPRESSION: Stable support apparatus. Stable bibasilar opacities. Stable left pleural effusion. Electronically Signed   By: Marijo Conception, M.D.   On: 11/13/2015 07:37   Dg Chest Port 1 View  11/12/2015  CLINICAL DATA:  Respiratory failure. EXAM: PORTABLE CHEST  1 VIEW COMPARISON:  11/11/2015. FINDINGS: Tracheostomy tube, feeding tube, left IJ line, right PICC line in stable position . Prior CABG. Cardiomegaly with diffuse bilateral pulmonary infiltrates consistent pulmonary edema. Progressed from prior exam. Small bilateral pleural effusions. No pneumothorax. IMPRESSION: 1. Lines and tubes in stable position. 2. Prior CABG. Cardiomegaly with progressive bilateral pulmonary infiltrates consistent with pulmonary edema. Small bilateral pleural effusions are noted. Electronically Signed   By: Marcello Moores  Register   On: 11/12/2015 07:46   Dg Chest Port 1 View  11/11/2015  CLINICAL DATA:  Tracheostomy placement EXAM: PORTABLE CHEST 1 VIEW COMPARISON:  11/11/2015 FINDINGS: Tracheostomy tube has been placed with the tip projecting over the mid trachea. No pneumothorax. Right PICC line remains in place, unchanged as does left central line.  Cardiomegaly with bilateral perihilar and lower lobe opacities, likely edema. Suspect layering effusions. IMPRESSION: Interval placement tracheostomy tube without pneumothorax. Suspected CHF. Bilateral effusions. Findings similar to prior study. Electronically Signed   By: Rolm Baptise M.D.   On: 11/11/2015 14:50   Dg Chest Port 1 View  11/11/2015  CLINICAL DATA:  Respiratory failure. EXAM: PORTABLE CHEST 1 VIEW COMPARISON:  11/10/2015. FINDINGS: Endotracheal tube, feeding tube, bilateral subclavian lines in stable position. Prior CABG. Cardiomegaly with diffuse bilateral pulmonary infiltrates consistent with pulmonary edema. Worsening from prior exam. Small left pleural effusion. No pneumothorax. IMPRESSION: 1.  Lines and tubes in stable position. 2. Prior CABG. Cardiomegaly with worsening bilateral pulmonary edema. Small left pleural effusion. Electronically Signed   By: Marcello Moores  Register   On: 11/11/2015 07:17   Dg Chest Port 1 View  11/10/2015  CLINICAL DATA:  Shortness of breath . EXAM: PORTABLE CHEST 1 VIEW COMPARISON:  11/09/2015. FINDINGS: Endotracheal tube, NG tube, right PICC line, left subclavian line in stable position. Prior CABG. Cardiomegaly. Left lower lobe atelectasis and/or infiltrate with small left pleural effusion. Mild right base atelectasis and or infiltrate. No pneumothorax . IMPRESSION: 1.  Lines and tubes in stable position. 2. Left lower lobe atelectasis and/or infiltrate with small left pleural effusion. Mild right base atelectasis and/or infiltrate. Findings stable from prior exam. 3.  Prior CABG.  Stable cardiomegaly. Electronically Signed   By: Marcello Moores  Register   On: 11/10/2015 07:51   Dg Chest Port 1 View  11/09/2015  CLINICAL DATA:  Atelectasis. EXAM: PORTABLE CHEST 1 VIEW COMPARISON:  11/08/2015 FINDINGS: Endotracheal tube in satisfactory position. Right arm PICC tip in the lower SVC unchanged. Left subclavian central venous catheter in the SVC. NG tube and feeding tube enters  the stomach with the tips not visualized. Negative for pneumothorax. Left lower lobe airspace disease unchanged. Small left effusion unchanged. Mild pulmonary vascular congestion unchanged. IMPRESSION: Support lines remain in satisfactory position and unchanged Left lower lobe atelectasis/ infiltrate unchanged. Mild right lower lobe airspace consolidation stable. Mild vascular congestion unchanged. Electronically Signed   By: Franchot Gallo M.D.   On: 11/09/2015 07:36   Dg Chest Port 1 View  11/08/2015  CLINICAL DATA:  Respiratory failure, hypertension, coronary artery disease post MI and CABG EXAM: PORTABLE CHEST 1 VIEW COMPARISON:  Portable exam 1216 hours compared to 11/06/2015 FINDINGS: Tip of endotracheal tube projects 6.8 cm above carina. Nasogastric tube and feeding tube extends into stomach. LEFT subclavian central venous catheter with tip projecting over SVC. RIGHT arm PICC line tip projects over SVC. Enlargement of cardiac silhouette post CABG. Stable mediastinal contours. Increased LEFT lower lobe consolidation. Slightly increased infiltrate at RIGHT base as well. Minimal central peribronchial thickening. No pleural  effusion or pneumothorax. IMPRESSION: Enlargement of cardiac silhouette post CABG. Stable line and tube positions. Increased LEFT lower lobe consolidation with minimally increased infiltrate at RIGHT base as well. Findings are suspicious for pneumonia involving the LEFT lower lobe though a component of coexisting pulmonary edema may be present. Electronically Signed   By: Lavonia Dana M.D.   On: 11/08/2015 12:47   Dg Chest Port 1 View  11/06/2015  CLINICAL DATA:  Acute CHF secondary to acute MI, complete heart block. EXAM: PORTABLE CHEST 1 VIEW COMPARISON:  Portable chest x-ray of November 05, 2015 FINDINGS: The lungs are adequately inflated. The pulmonary interstitial markings are less prominent on the right today. They have improved on the left as well and the hemidiaphragm is now visible.  There is persistent interstitial edema on the left. The cardiac silhouette remains enlarged. The intraaortic balloon pump marker tip projects over the proximal portion of descending thoracic aorta and appears stable. The endotracheal tube tip projects 5.5 cm above the carina. The nasogastric and Doppler AH feeding tubes have their tips below the inferior margin of the image. The right-sided PICC line tip projects over the junction of the middle and distal thirds of the SVC. The left subclavian venous catheter tip projects over the midportion of the SVC. IMPRESSION: Decreasing pulmonary edema consistent with improvement in CHF. Persistent perihilar interstitial edema on the left. The support tubes and devices are in reasonable position. Electronically Signed   By: David  Martinique M.D.   On: 11/06/2015 07:32   Dg Chest Port 1 View  11/05/2015  CLINICAL DATA:  Respiratory failure. EXAM: PORTABLE CHEST 1 VIEW COMPARISON:  11/04/2015 . FINDINGS: Endotracheal tube, NG tube, right PICC line stable position. Prior CABG. Cardiomegaly. Persistent left mid and lower lung field infiltrate. Persistent mild atelectatic changes right upper and right lower lobes. No pneumothorax. IMPRESSION: 1.  Lines and tubes in stable position. 2. Persistent left mid and left lower lung infiltrate, no interim change. Persistent right upper lobe and right lower lobe subsegmental atelectasis. No interim change. 3. Prior CABG.  Stable cardiomegaly. Electronically Signed   By: Marcello Moores  Register   On: 11/05/2015 07:48   Dg Chest Port 1 View  11/04/2015  CLINICAL DATA:  Hypoxia EXAM: PORTABLE CHEST 1 VIEW COMPARISON:  November 04, 2015 FINDINGS: Endotracheal tube tip is 5.3 cm above the carina. Central catheter tip is in the superior vena cava near the cavoatrial junction. Nasogastric tube tip and side port are below the diaphragm. There also is a feeding tube with the tip below the diaphragm. No pneumothorax. There has been removal of a left chest  tube. There is airspace consolidation throughout the left mid and lower lung zones, stable. There is mild atelectasis in the right upper lobe, stable. No new opacity is evident. There is stable cardiomegaly. The pulmonary vascularity is normal. No adenopathy is evident. IMPRESSION: Tube and catheter positions as described without pneumothorax. Airspace consolidation throughout the left mid and lower lung zones, stable. Atelectasis right upper lobe, stable. No new opacity. Stable cardiomegaly. Electronically Signed   By: Lowella Grip III M.D.   On: 11/04/2015 14:52   Dg Chest Port 1 View  11/04/2015  CLINICAL DATA:  Status post CABG 10/28/2015. EXAM: PORTABLE CHEST 1 VIEW COMPARISON:  Single view of the chest 11/03/2015 and 11/02/2015. FINDINGS: Support tubes and lines including a left chest tube are unchanged since the most recent exam. The chest is better expanded today with decreased atelectasis. Airspace opacity in the left mid  and lower lung zones persists. There is likely a left pleural effusion. Marked cardiomegaly is seen with only mild vascular congestion noted. IMPRESSION: Increased pulmonary expansion with decreased scattered atelectasis. Left mid and lower lung zone airspace disease could be due to atelectasis or pneumonia. Small left pleural effusion noted. Support tubes and lines projecting good position.  No pneumothorax. Electronically Signed   By: Inge Rise M.D.   On: 11/04/2015 07:30   Dg Chest Port 1 View  11/03/2015  CLINICAL DATA:  Central line placement. EXAM: PORTABLE CHEST 1 VIEW COMPARISON:  11/03/2015. FINDINGS: Left subclavian central line noted with tip at the cavoatrial junction. Interval removal of right chest tube. Endotracheal tube, NG tube, right PICC line, left IJ sheath left chest tube in stable position. No pneumothorax. Prior CABG. Stable cardiomegaly. Low lung volumes. A mild component of congestive heart failure cannot be excluded. Similar findings noted on  prior exam P IMPRESSION: 1. Interim placement of left subclavian central line, its tip is at the cavoatrial junction. Interim removal right chest tube . No pneumothorax. 2. Remaining lines and tubes including left chest tube in stable position. 3. Prior CABG. Stable cardiomegaly. Low lung volumes. A a mild component congestive heart failure cannot be excluded . Similar findings noted on prior exam. Electronically Signed   By: [place]   On: 11/03/2015 09:58   Dg Chest Port 1 View  11/03/2015  CLINICAL DATA:  Status post CABG on October 27, 2015 persistent left lower lobe atelectasis or pneumonia. No pneumothorax or significant pleural effusion. EXAM: PORTABLE CHEST 1 VIEW COMPARISON:  Portable chest x-ray of November 02, 2015 at 5:48 a.m. FINDINGS: There has been interval placement of an intra aortic balloon pump whose marker lies over the junction of the aortic arch with the proximal descending thoracic aorta. The cardiac silhouette remains enlarged. The pulmonary vascularity remains mildly prominent. The retrocardiac region on the left remains dense. Bilateral chest tubes are in stable position. There is no pneumothorax nor significant pleural effusion. The endotracheal tube tip lies approximately 6.5 cm above the carina. The esophagogastric tube tip projects below the inferior margin of the image. The right-sided PICC line tip projects over the midportion of the SVC. A previously demonstrated pericardial drain is not clearly evident on today's study. IMPRESSION: Mild CHF. Persistent left lower lobe atelectasis or pneumonia. Interval placement of an intra aortic balloon pump which appears to be in appropriate position radiographically. The other support tubes and lines are in reasonable position. Electronically Signed   By: David  Martinique M.D.   On: 11/03/2015 07:27   Dg Chest Port 1 View  11/02/2015  CLINICAL DATA:  CABG. EXAM: PORTABLE CHEST 1 VIEW COMPARISON:  11/01/2015. FINDINGS: Endotracheal tube, NG  tube, right PICC line, bilateral chest tubes in stable position. Prior CABG. Stable cardiomegaly . Persistent mild interstitial prominence, congestive heart failure cannot be excluded. No interim change. Low lung volumes with basilar atelectasis . IMPRESSION: 1. Endotracheal tube, NG tube, right PICC line, bilateral chest tubes in stable position . No pneumothorax. 2. Prior CABG. Cardiomegaly with pulmonary interstitial prominence and small left pleural effusion consistent with mild congestive heart failure. No interim change from prior exam . Electronically Signed   By: Marcello Moores  Register   On: 11/02/2015 07:20   Dg Chest Port 1 View  11/01/2015  CLINICAL DATA:  Post CABG EXAM: PORTABLE CHEST 1 VIEW COMPARISON:  10/31/2015 FINDINGS: Cardiomediastinal silhouette is stable. Status post CABG. Stable endotracheal and NG tube position. Left chest  tube is unchanged in position. Persistent central vascular congestion and mild interstitial prominence bilaterally suspicious for the mild interstitial edema. Left IJ sheath in place. Probable small left pleural effusion left basilar atelectasis or infiltrate. Right arm PICC line is unchanged in position. There is no pneumothorax. IMPRESSION: Status post CABG. Stable support apparatus. Stable left chest tube position. No pneumothorax. Again noted bilateral mild interstitial prominence and perihilar opacities suspicious for mild pulmonary edema. Probable small left pleural effusion left basilar atelectasis or infiltrate. Electronically Signed   By: Lahoma Crocker M.D.   On: 11/01/2015 09:57   Dg Chest Port 1 View  10/31/2015  CLINICAL DATA:  55 year old male with a history of endotracheal tube placement Patient has undergone coronary artery bypass grafting x4 10/27/2015. Placement of intra aortic balloon pump at this date. Ischemic cardiomyopathy. EXAM: PORTABLE CHEST 1 VIEW COMPARISON:  10/30/2015. FINDINGS: Endotracheal tube terminates approximately 3.4 cm above the carina,  unchanged. Gastric tube terminates out of the field of view. Defibrillator pads project over the left and right chest. Bilateral thoracostomy tubes. Right upper extremity PICC. The mediastinal drain not visualized. Intra-aortic balloon pump not visualized. Surgical changes of median sternotomy and CABG. Mixed bilateral interstitial and airspace opacities. Low lung volumes. IMPRESSION: Low lung volumes with mixed bilateral interstitial and airspace opacities, similar to the comparison chest x-ray. Retrocardiac opacity may reflect persistent pleural fluid, and/or atelectasis. Surgical support apparatus appear unchanged, as above. Intra aortic balloon pump not visualized. Signed, Dulcy Fanny. Earleen Newport, DO [facility] [place] [facility] Electronically Signed   By: Corrie Mckusick D.O.   On: 10/31/2015 07:25   Dg Chest Port 1 View  10/30/2015  CLINICAL DATA:  Post cavity.  Recent CABG. EXAM: PORTABLE CHEST 1 VIEW COMPARISON:  Portable film earlier today. FINDINGS: Overlying support apparatus redemonstrated. Tubes and lines remain stable. Elevated LEFT hemidiaphragm. No definite pneumothorax. Moderate vascular congestion may be increased. Cardiomegaly. IMPRESSION: Slight worsening aeration. Moderate vascular congestion may be increased. Electronically Signed   By: Staci Righter M.D.   On: 10/30/2015 18:39   Dg Chest Port 1 View  10/30/2015  CLINICAL DATA:  Status post coronary bypass grafting EXAM: PORTABLE CHEST 1 VIEW COMPARISON:  10/30/2015 FINDINGS: Cardiac shadow remains enlarged. Bilateral chest tubes are again seen. A right-sided PICC line and endotracheal tube are noted in satisfactory position. Left jugular sheath is noted. Elevation of left hemidiaphragm is noted with left basilar atelectasis. No pneumothorax is noted. IMPRESSION: Postsurgical change with tubes and lines as described. Mild left basilar atelectasis. Electronically Signed   By: Inez Catalina M.D.   On:  10/30/2015 07:45   Dg Chest Port 1 View  10/30/2015  CLINICAL DATA:  A chest tube in place EXAM: PORTABLE CHEST 1 VIEW COMPARISON:  10/30/2015 FINDINGS: Postoperative changes in the mediastinum. Endotracheal tube with tip measuring 6.3 cm about the carina. Right PICC catheter with tip over the cavoatrial junction. Left central venous catheter sheath with tip over the left neck consistent location in the internal jugular vein. Bilateral chest tubes. Infiltration or atelectasis in the right lung base. Cardiac enlargement. No significant vascular congestion. No pneumothorax. IMPRESSION: Appliance positioned as described. Cardiac enlargement with infiltration or atelectasis in the right lung base. Bilateral chest tubes are present but no visualized pneumothorax. Electronically Signed   By: Lucienne Capers M.D.   On: 10/30/2015 03:07   Dg Chest Port 1 View  10/30/2015  CLINICAL DATA:  Shortness of breath.  Code blue. EXAM: PORTABLE CHEST 1 VIEW  COMPARISON:  10/29/2015 FINDINGS: Postoperative changes in the mediastinum. Bilateral chest tubes are present. Right PICC line with tip over the cavoatrial junction. Left central venous catheter or sheath with tip over the left side of the neck, likely in the left internal jugular vein. Cardiac enlargement without significant vascular congestion. Shallow inspiration with elevation of the left hemidiaphragm. Probable left pleural effusion. No definite pulmonary consolidation. IMPRESSION: Shallow inspiration. Probable left pleural effusion. Cardiac enlargement. Appliances appear in satisfactory position. Electronically Signed   By: Lucienne Capers M.D.   On: 10/30/2015 01:18   Dg Chest Port 1 View  10/29/2015  CLINICAL DATA:  Postop CABG 2 days ago. EXAM: PORTABLE CHEST 1 VIEW COMPARISON:  Portable chest x-ray of October 28, 2015 FINDINGS: The trachea and esophagus have been extubated. The cardiac silhouette remains enlarged. The pulmonary vascularity is mildly engorged.  The bilateral chest tubes and the mediastinal drain are in stable position. There is no pneumothorax or large pleural effusion. The Swan-Ganz catheter tip overlies a proximal right lower lobe pulmonary artery branch. The mediastinum is widened and accentuated by the hypo inflation. IMPRESSION: Bilateral hypoinflation following extubation of the trachea there is crowding of the pulmonary vascularity. Left basilar atelectasis or less likely pneumonia is more prominent today. Probable small left pleural effusion. The remaining support tubes and lines are in stable position. Electronically Signed   By: David  Martinique M.D.   On: 10/29/2015 07:40   Dg Chest Port 1 View  10/28/2015  CLINICAL DATA:  Post CABG, on ventilator, followup portable chest x-ray of 10/28/2015 EXAM: PORTABLE CHEST 1 VIEW COMPARISON:  None. FINDINGS: The tip of the endotracheal tube is approximately 5.2 cm above the carina. Aeration has improved slightly. Atelectasis remains at the lung bases left-greater-than-right. Swan-Ganz catheter tip is in the right lower lobe pulmonary artery and bilateral chest tubes remain. No pneumothorax is seen. Heart size is stable. IMPRESSION: 1. Improved aeration. 2. Bilateral chest tubes.  No pneumothorax. 3. Tip of endotracheal tube approximately 5.2 cm above the carina. Electronically Signed   By: Ivar Drape M.D.   On: 10/28/2015 08:01   Dg Chest Port 1 View  10/27/2015  CLINICAL DATA:  Status post CABG EXAM: PORTABLE CHEST 1 VIEW COMPARISON:  10/27/2015 FINDINGS: Unchanged tracheostomy tube. Swan-Ganz central venous line is now seen with tip advanced into the right perihilar region. Stable right chest tube with and left chest tube. NG tube again crosses the gastroesophageal junction. Stable mediastinal drain. Enlarged cardiac silhouette. Limited inspiratory effect with bibasilar opacities likely representing atelectasis. IMPRESSION: Anticipated postoperative appearance with bibasilar atelectasis.  Electronically Signed   By: Skipper Cliche M.D.   On: 10/27/2015 19:25   Dg Chest Portable 1 View  10/27/2015  CLINICAL DATA:  Status post coronary bypass grafting EXAM: PORTABLE CHEST 1 VIEW COMPARISON:  10/23/2015 FINDINGS: Cardiac shadow is mildly enlarged. A Swan-Ganz catheter is noted in the right pulmonary outflow tract. Bilateral thoracostomy catheters are seen. No pneumothorax is noted. An endotracheal tube is noted in satisfactory position. Intra-aortic balloon pump is noted over the aortic arch. Mild left basilar atelectasis is seen. Mild central vascular congestion is noted. IMPRESSION: Postoperative changes with tubes and lines as described above. Mild vascular congestion and left basilar atelectasis. Electronically Signed   By: Inez Catalina M.D.   On: 10/27/2015 17:23   Dg Abd Portable 1v  11/19/2015  CLINICAL DATA:  Encounter for enteric feeding tube placement. EXAM: PORTABLE ABDOMEN - 1 VIEW COMPARISON:  11/18/2015 FINDINGS: Enteric feeding tube  tip now projects in the fourth portion of the duodenum, near the ligament of Treitz. There is small bowel dilation which appears increased from the prior radiographs. IMPRESSION: Enteric feeding tube metallic tip now projects in the fourth portion of the duodenum near the ligament of Treitz. Increased small bowel dilation which may reflect an adynamic ileus or partial obstruction this is only partly imaged on this exam. Electronically Signed   By: Lajean Manes M.D.   On: 11/19/2015 10:24   Dg Abd Portable 1v  11/18/2015  CLINICAL DATA:  55 year old male feeding tube placement. Initial encounter. EXAM: PORTABLE ABDOMEN - 1 VIEW COMPARISON:  11/16/2015. FINDINGS: Portable AP supine view at 1109 hours. A enteric tube placed, tip at the level of the distal second portion of the duodenum. Epicardial pacer wires remain in place. Increased gaseous distension of large bowel in the abdomen, but no dilated small bowel loops. Mild gaseous distension of the  stomach. No definite pneumoperitoneum on this supine view. Stable visualized osseous structures. IMPRESSION: 1. Enteric tube placed, it tip at the mid duodenum. 2. Increased large bowel gas, favor ileus over mechanical obstruction. Electronically Signed   By: Genevie Ann M.D.   On: 11/18/2015 12:01   Dg Abd Portable 1v  11/16/2015  CLINICAL DATA:  Ileus, acute MI with CHF EXAM: PORTABLE ABDOMEN - 1 VIEW COMPARISON:  Portable abdominal films of November 15, 2015 FINDINGS: There remain small amounts of gas within small bowel loops as well as and large bowel loops. No abnormally distended bowel loops are observed. There is gas in the stomach. A feeding tube is in place whose tip appears to lie at the duodenal -jejunal junction. IMPRESSION: Fairly stable appearance of the abdomen with moderate amounts of gas within small and large bowel loops consistent with ileus. No free extraluminal gas collections are observed. Electronically Signed   By: David  Martinique M.D.   On: 11/16/2015 07:20   Dg Abd Portable 1v  11/15/2015  CLINICAL DATA:  Chronic ventilator dependent respiratory failure. Acute onset of nausea and vomiting. EXAM: PORTABLE ABDOMEN - 1 VIEW COMPARISON:  11/03/2015, 11/02/2015. FINDINGS: Feeding tube tip projects at the expected location of the 4th portion of the duodenum at or near the ligament Treitz. Nonobstructive bowel gas pattern. Gas in normal caliber loops of small bowel throughout the abdomen and pelvis. Gas throughout normal caliber colon from cecum to rectum. No suggestion of free intraperitoneal air. IMPRESSION: 1. Bowel gas pattern indicating a mild generalized ileus. No evidence of bowel obstruction. 2. Feeding tube tip appropriately positioned in the 4th portion of the duodenum at or near the ligament of Treitz. Electronically Signed   By: Evangeline Dakin M.D.   On: 11/15/2015 07:26   Dg Abd Portable 1v  11/02/2015  CLINICAL DATA:  Assess feeding tube positioning EXAM: PORTABLE ABDOMEN - 1 VIEW  COMPARISON:  None in PACs FINDINGS: The repeated KUB with has less motion artifact. The tip of the feeding tube and nasogastric tube lie in the region of the distal gastric body in the pre-pyloric region. The bowel gas pattern is within the limits of normal. Numerous tubes and lines and electrodes overlie the abdomen. IMPRESSION: The tips of the feeding tube and the esophagogastric tube lie in the region of the distal gastric body in the pre-pyloric region. Electronically Signed   By: David  Martinique M.D.   On: 11/02/2015 12:16   Dg Addison Bailey G Tube Plc W/fl-no Rad  11/03/2015  CLINICAL DATA:  NASO G TUBE PLACEMENT  WITH FLUORO Fluoroscopy was utilized by the requesting physician.  No radiographic interpretation.    PHYSICAL EXAM CVP 15 General: Sitting up in chair Neck: JVP hard to see.  no thyromegaly or thyroid nodule.  S/p Trach  Lungs:clear CV: Nondisplaced PMI.  Heart regular S1/S2, no S3/S4, no murmur. 2+ edema. Abdomen: Distended. Nontender. Good BS.   Neurologic: alert and oriented x 3 Extremities: No clubbing or cyanosis.  Dopplerable pedal pulses.   TELEMETRY: NSR 60s  ASSESSMENT AND PLAN:  1. CAD: S/p late presentation inferior MI and CABG x 4. Continue statin, ASA. Coronary angiography 3/27 with multiple VT episodes showed patent LIMA-LAD and SVG-RCA.  Patent SVG-ramus and SVG-D but slow flow in both grafts with diffusely diseased small target vessels.  2. Cardiogenic shock: Acute systolic CHF with RV failure post inferior MI with suspected RV involvement. EF 25-30% on echo (difficult images on 3/24 echo). He has significant RV failure from suspected RV infarct.  3/27 IABP placed to try to allow decrease in milrinone with frequent VT.  3/31 echo with EF 30-35%.  IABP out 4/2.  Milrinone off 4/7 - Doing well off milrinone - CVP remains up likely due to RV failure. Continue lasix 40 iv bid. May be able to transition to torsemide soon. Continue UNA boots. - No spiro yet with CKD.  - He  is now off NO and on sildenafil 40 tid.   -Continue hydralazine to 50 mg tid for afterload reduction (no Imdur with sildenafil use).   3. Complete heart block: Resolved 4. VF arrest 3/24 early am and again at night 3/24.  Ectopy quiescent. - Continue po amio 5. AKI: Creatinine improving. Now 1.7 Suspect ATN. Baseline creatinine 1.0. Renal following 6. ID: Possible LLL PNA.  He completed course of  vancomycin/levofloxacin.  7. Pulmonary: S/P Trach 4/5 on trach collar trials. 8. Hypernatremia: Back on D5w 9. AFL - Back in NSR. On heparin and po amio.  10. Hypernatremia- Na back up. Renal adjusting H20 11. Ileus - resolving 12. Deconditioning - continue PT  Glori Bickers MD  11/20/2015 12:47 PM

## 2015-11-21 ENCOUNTER — Inpatient Hospital Stay (HOSPITAL_COMMUNITY): Payer: BLUE CROSS/BLUE SHIELD

## 2015-11-21 DIAGNOSIS — I472 Ventricular tachycardia, unspecified: Secondary | ICD-10-CM | POA: Insufficient documentation

## 2015-11-21 LAB — CBC
HCT: 34 % — ABNORMAL LOW (ref 39.0–52.0)
Hemoglobin: 9.9 g/dL — ABNORMAL LOW (ref 13.0–17.0)
MCH: 26.8 pg (ref 26.0–34.0)
MCHC: 29.1 g/dL — ABNORMAL LOW (ref 30.0–36.0)
MCV: 92.1 fL (ref 78.0–100.0)
Platelets: 223 10*3/uL (ref 150–400)
RBC: 3.69 MIL/uL — ABNORMAL LOW (ref 4.22–5.81)
RDW: 17.5 % — ABNORMAL HIGH (ref 11.5–15.5)
WBC: 9.1 10*3/uL (ref 4.0–10.5)

## 2015-11-21 LAB — GLUCOSE, CAPILLARY
GLUCOSE-CAPILLARY: 107 mg/dL — AB (ref 65–99)
GLUCOSE-CAPILLARY: 100 mg/dL — AB (ref 65–99)
Glucose-Capillary: 100 mg/dL — ABNORMAL HIGH (ref 65–99)
Glucose-Capillary: 94 mg/dL (ref 65–99)
Glucose-Capillary: 95 mg/dL (ref 65–99)
Glucose-Capillary: 96 mg/dL (ref 65–99)
Glucose-Capillary: 99 mg/dL (ref 65–99)

## 2015-11-21 LAB — CULTURE, RESPIRATORY W GRAM STAIN
Culture: NORMAL
Special Requests: NORMAL

## 2015-11-21 LAB — BASIC METABOLIC PANEL
Anion gap: 11 (ref 5–15)
BUN: 54 mg/dL — AB (ref 6–20)
CHLORIDE: 111 mmol/L (ref 101–111)
CO2: 26 mmol/L (ref 22–32)
CREATININE: 1.68 mg/dL — AB (ref 0.61–1.24)
Calcium: 8.4 mg/dL — ABNORMAL LOW (ref 8.9–10.3)
GFR calc Af Amer: 52 mL/min — ABNORMAL LOW (ref >60)
GFR calc non Af Amer: 45 mL/min — ABNORMAL LOW (ref >60)
Glucose, Bld: 107 mg/dL — ABNORMAL HIGH (ref 65–99)
Potassium: 4.1 mmol/L (ref 3.5–5.1)
Sodium: 148 mmol/L — ABNORMAL HIGH (ref 135–145)

## 2015-11-21 LAB — CARBOXYHEMOGLOBIN
Carboxyhemoglobin: 1.8 % — ABNORMAL HIGH (ref 0.5–1.5)
Methemoglobin: 0.7 % (ref 0.0–1.5)
O2 Saturation: 68.6 %
Total hemoglobin: 10.5 g/dL — ABNORMAL LOW (ref 13.5–18.0)

## 2015-11-21 LAB — MAGNESIUM: Magnesium: 1.9 mg/dL (ref 1.7–2.4)

## 2015-11-21 MED ORDER — TORSEMIDE 20 MG PO TABS
60.0000 mg | ORAL_TABLET | Freq: Every day | ORAL | Status: DC
  Administered 2015-11-21 – 2015-11-24 (×4): 60 mg via ORAL
  Filled 2015-11-21 (×4): qty 3

## 2015-11-21 MED ORDER — FREE WATER
200.0000 mL | Status: DC
  Administered 2015-11-21 – 2015-11-24 (×19): 200 mL

## 2015-11-21 NOTE — Progress Notes (Signed)
Patient ID: Christopher [name], male   DOB: Nov 28, 1960, 55 y.o.   MRN: [id number]   ADVANCED HF ROUNDING NOTE  Subjective:  Sitting in chair. Just walked whole unit. Has PM valve. Pending swallow study today. Very eager to eat. No VT.  Now getting NS for hypernatremia. I/Os even despite IV lasix. Sodium 148. CVP 15. Co-ox 69%. Cr down to 1.6   Scheduled Meds: . amiodarone  200 mg Oral BID  . antiseptic oral rinse  7 mL Mouth Rinse QID  . aspirin  81 mg Oral Daily  . atorvastatin  80 mg Oral q1800  . chlorhexidine gluconate (SAGE KIT)  15 mL Mouth Rinse BID  . enoxaparin (LOVENOX) injection  40 mg Subcutaneous Q24H  . feeding supplement (PRO-STAT SUGAR FREE 64)  30 mL Per Tube TID  . free water  200 mL Per Tube Q4H  . hydrALAZINE  25 mg Oral 3 times per day  . insulin aspart  0-24 Units Subcutaneous 6 times per day  . insulin detemir  12 Units Subcutaneous BID  . levalbuterol  1.25 mg Nebulization TID  . pantoprazole sodium  40 mg Per Tube Daily  . potassium chloride  40 mEq Oral 3 times per day  . QUEtiapine  50 mg Oral QHS  . sildenafil  40 mg Oral TID  . silver sulfADIAZINE   Topical BID  . sodium chloride flush  10-40 mL Intracatheter Q12H  . torsemide  60 mg Oral Daily  . triamcinolone cream   Topical BID   Continuous Infusions: . sodium chloride Stopped (11/15/15 0600)  . dextrose Stopped (11/20/15 2000)  . feeding supplement (VITAL HIGH PROTEIN) 1,000 mL (11/21/15 0600)   PRN Meds:.diphenhydrAMINE, fentaNYL (SUBLIMAZE) injection, levalbuterol, midazolam, ondansetron (ZOFRAN) IV, sodium chloride flush, traMADol    Filed Vitals:   11/21/15 0405 11/21/15 0500 11/21/15 0600 11/21/15 0700  BP: 99/77 111/67 129/81 119/82  Pulse: 82 74 88 83  Temp:    97.9 F (36.6 C)  TempSrc:    Oral  Resp: _0 Height:      Weight:  106.7 kg (235 lb 3.7 oz)    SpO2: 97% 100% 93% 96%    Intake/Output Summary (Last 24 hours) at 11/21/15 0817 Last data filed at 11/21/15  0600  Gross per 24 hour  Intake 3555.83 ml  Output   3345 ml  Net 210.83 ml    LABS: Basic Metabolic Panel:  Recent Labs  11/19/15 0400  11/20/15 0445 11/21/15 0444  NA 146*  < > 149* 148*  K 3.8  < > 4.3 4.1  CL 110  < > 114* 111  CO2 24  --  26 26  GLUCOSE 94  < > 136* 107*  BUN 64*  < > 58* 54*  CREATININE 2.02*  < > 1.73* 1.68*  CALCIUM 9.0  --  8.6* 8.4*  MG 2.1  --  2.1 1.9  PHOS 4.5  --   --   --   < > = values in this interval not displayed. Liver Function Tests:  Recent Labs  11/19/15 0400  ALBUMIN 2.7*   No results for input(s): LIPASE, AMYLASE in the last 72 hours. CBC:  Recent Labs  11/20/15 0445 11/21/15 0444  WBC 9.9 9.1  HGB 10.3* 9.9*  HCT 34.3* 34.0*  MCV 91.5 92.1  PLT 215 223   Cardiac Enzymes: No results for input(s): CKTOTAL, CKMB, CKMBINDEX, TROPONINI in the last 72 hours. BNP: Invalid input(s): POCBNP  D-Dimer: No results for input(s): DDIMER in the last 72 hours. Hemoglobin A1C: No results for input(s): HGBA1C in the last 72 hours. Fasting Lipid Panel: No results for input(s): CHOL, HDL, LDLCALC, TRIG, CHOLHDL, LDLDIRECT in the last 72 hours. Thyroid Function Tests: No results for input(s): TSH, T4TOTAL, T3FREE, THYROIDAB in the last 72 hours.  Invalid input(s): FREET3 Anemia Panel: No results for input(s): VITAMINB12, FOLATE, FERRITIN, TIBC, IRON, RETICCTPCT in the last 72 hours.  RADIOLOGY: Dg Chest 2 View  10/23/2015  CLINICAL DATA:  CHF.  Weakness. EXAM: CHEST  2 VIEW COMPARISON:  10/21/2015 FINDINGS: Multiple interstitial densities in the lower chest are suggestive for interstitial edema, right side greater than left. Heart size is upper limits of normal. Trachea is midline. Negative for a pneumothorax. Evidence for a small right pleural effusion. IMPRESSION: Interstitial densities in the lower chest, right side greater than left. Findings are suggestive for interstitial pulmonary edema. Difficult to exclude some airspace  disease in the right lower lung. Evidence for small right pleural effusion. Electronically Signed   By: Markus Daft M.D.   On: 10/23/2015 08:27   Dg Abd 1 View  11/03/2015  CLINICAL DATA:  Enteric tube advanced to post pyloric position. EXAM: ABDOMEN - 1 VIEW COMPARISON:  11/02/2015 abdominal radiographs. FINDINGS: Fluoroscopy time 7 minutes. Enteric tube courses through the stomach and duodenum and appears to terminate near the duodenal jejunal junction. Instilled contrast opacifies nondilated proximal jejunal small bowel loops. No contrast leak is demonstrated. Separate enteric tube terminates in the body of the stomach. Visualized lower sternotomy wires appear aligned and intact. IMPRESSION: Enteric tube appears to terminate near the duodenal jejunal junction. Separate enteric tube terminates in the body of the stomach. Electronically Signed   By: Ilona Sorrel M.D.   On: 11/03/2015 17:02   Dg Chest Port 1 View  11/20/2015  CLINICAL DATA:  Shortness of breath. EXAM: PORTABLE CHEST 1 VIEW COMPARISON:  11/18/2015 . FINDINGS: Tracheostomy tube, right PICC line, feeding tube in stable position. Prior CABG. Cardiomegaly. Worsening left lung infiltrate. Worsening left pleural effusion. Interim partial clearing of right base atelectasis and or infiltrate . No pneumothorax. IMPRESSION: 1. Lines and tubes in stable position. 2. Progressive left lung infiltrate consistent with pneumonia. Progressive left pleural effusion. 3. Interim partial clearing of right base atelectasis and/or infiltrate. 4.  Prior CABG.  Stable severe cardiomegaly. Electronically Signed   By: Marcello Moores  Register   On: 11/20/2015 07:36   Dg Chest Port 1 View  11/18/2015  CLINICAL DATA:  Respiratory failure. EXAM: PORTABLE CHEST 1 VIEW COMPARISON:  11/17/2015. FINDINGS: Tracheostomy tube, feeding tube, right PICC line, left subclavian line in stable position. Prior CABG. Stable cardiomegaly. Persistent left lower lobe infiltrate/edema again noted.  Right base atelectasis and/or infiltrate/edema noted. Small left pleural effusion . These changes may be related to bibasilar pneumonia and/or congestive heart failure . The thorax. IMPRESSION: 1. Lines and tubes in stable position . 2.  Prior CABG.  Persistent cardiomegaly. 3. Persistent left lower lobe infiltrate and small left pleural effusion. Developing right lower lobe infiltrate. These changes may be related congestive heart failure with pulmonary edema. Bilateral pneumonia could also present in this fashion. Electronically Signed   By: Marcello Moores  Register   On: 11/18/2015 07:41   Dg Chest Port 1 View  11/17/2015  CLINICAL DATA:  Shortness of breath. EXAM: PORTABLE CHEST 1 VIEW COMPARISON:  11/16/2015. FINDINGS: Tracheostomy tube, feeding tube, right PICC line, left subclavian central line in stable position. Prior CABG.  Cardiomegaly. Left lower lobe infiltrate a small left pleural effusion. Mild right base infiltrate. No pneumothorax. IMPRESSION: 1.  Lines and tubes in stable position. 2. Persistent left lower lobe infiltrate and left pleural effusion. No interim change. Mild right lower lobe infiltrate. No change from prior exam . 3.  Stable cardiomegaly. Electronically Signed   By: Marcello Moores  Register   On: 11/17/2015 07:19   Dg Chest Port 1 View  11/16/2015  CLINICAL DATA:  Follow-up respiratory failure EXAM: PORTABLE CHEST 1 VIEW COMPARISON:  11/15/2014 FINDINGS: Prior CABG. Tracheostomy tube, right PICC line and left subclavian central line remain in place, unchanged. There is cardiomegaly with vascular congestion. Continued left lower lobe atelectasis or pneumonia and suspect small left pleural effusion. No significant change since prior study. No confluent opacity on the right. IMPRESSION: Stable left lower lobe atelectasis or pneumonia with small left effusion. Stable cardiomegaly with vascular congestion. Electronically Signed   By: Rolm Baptise M.D.   On: 11/16/2015 07:27   Dg Chest Port 1  View  11/15/2015  CLINICAL DATA:  Chronic ventilator dependent respiratory failure. CABG and right carotid endarterectomy 10/27/2015. EXAM: PORTABLE CHEST 1 VIEW COMPARISON:  11/14/2015 and earlier. FINDINGS: Tracheostomy tube tip in satisfactory position below the thoracic inlet. Left subclavian central venous catheter tip projects over the mid SVC, unchanged. Right arm PICC tip projects over lower SVC at or near the cavoatrial junction, unchanged. Feeding tube courses below the diaphragm into the stomach. Sternotomy for CABG. Cardiac silhouette moderately enlarged. Mild pulmonary venous hypertension without overt edema, unchanged. Dense consolidation in the left lower lobe with air bronchograms, unchanged. Moderate-sized left pleural effusion, unchanged. No new pulmonary parenchymal abnormalities. IMPRESSION: 1.  Support apparatus satisfactory. 2. Stable dense left lower lobe atelectasis and/or pneumonia and left pleural effusion. 3. Stable cardiomegaly and pulmonary venous hypertension without overt edema. 4. No new abnormalities. Electronically Signed   By: Evangeline Dakin M.D.   On: 11/15/2015 07:22   Dg Chest Port 1 View  11/14/2015  CLINICAL DATA:  55 year old male with a history of shortness of breath. Patient admission 10/20/2015. Right carotid endarterectomy 10/27/2015, with same-day coronary artery bypass grafting 10/27/2015. Twelve days postop now with history of intra-aortic balloon pump. EXAM: PORTABLE CHEST 1 VIEW COMPARISON:  11/13/2015, 11/12/2015, 11/11/2015 FINDINGS: Cardiomediastinal silhouette likely unchanged, with the left heart border partially obscured by overlying lung and pleural disease. Low lung volumes persist with bibasilar opacities and obscuration of the retrocardiac region. Obscuration of left hemidiaphragm. Unchanged tracheostomy tube, enteric feeding tube terminating out of the field of view, right upper extremity PICC and left subclavian central line. IMPRESSION: Similar  appearance of the chest x-ray, with persisting bibasilar airspace opacities and left pleural effusion. Unchanged support apparatus as above. Signed, Dulcy Fanny. Earleen Newport, DO [facility] Electronically Signed   By: Corrie Mckusick D.O.   On: 11/14/2015 08:48   Dg Chest Port 1 View  11/13/2015  CLINICAL DATA:  Shortness of breath. EXAM: PORTABLE CHEST 1 VIEW COMPARISON:  November 12, 2015. FINDINGS: Stable cardiomegaly. Status post coronary bypass graft. Tracheostomy and feeding tube are unchanged. Bilateral subclavian catheters are unchanged. No pneumothorax is noted. Stable bibasilar opacities are noted with left greater than right. Stable left pleural effusion is noted. Bony thorax is unremarkable. IMPRESSION: Stable support apparatus. Stable bibasilar opacities. Stable left pleural effusion. Electronically Signed   By: Marijo Conception, M.D.   On: 11/13/2015 07:37   Dg Chest Port 1 View  11/12/2015  CLINICAL DATA:  Respiratory failure. EXAM: PORTABLE CHEST 1 VIEW COMPARISON:  11/11/2015. FINDINGS: Tracheostomy tube, feeding tube, left IJ line, right PICC line in stable position . Prior CABG. Cardiomegaly with diffuse bilateral pulmonary infiltrates consistent pulmonary edema. Progressed from prior exam. Small bilateral pleural effusions. No pneumothorax. IMPRESSION: 1. Lines and tubes in stable position. 2. Prior CABG. Cardiomegaly with progressive bilateral pulmonary infiltrates consistent with pulmonary edema. Small bilateral pleural effusions are noted. Electronically Signed   By: Marcello Moores  Register   On: 11/12/2015 07:46   Dg Chest Port 1 View  11/11/2015  CLINICAL DATA:  Tracheostomy placement EXAM: PORTABLE CHEST 1 VIEW COMPARISON:  11/11/2015 FINDINGS: Tracheostomy tube has been placed with the tip projecting over the mid trachea. No pneumothorax. Right PICC line remains in place, unchanged as does left central line. Cardiomegaly with bilateral perihilar  and lower lobe opacities, likely edema. Suspect layering effusions. IMPRESSION: Interval placement tracheostomy tube without pneumothorax. Suspected CHF. Bilateral effusions. Findings similar to prior study. Electronically Signed   By: Rolm Baptise M.D.   On: 11/11/2015 14:50   Dg Chest Port 1 View  11/11/2015  CLINICAL DATA:  Respiratory failure. EXAM: PORTABLE CHEST 1 VIEW COMPARISON:  11/10/2015. FINDINGS: Endotracheal tube, feeding tube, bilateral subclavian lines in stable position. Prior CABG. Cardiomegaly with diffuse bilateral pulmonary infiltrates consistent with pulmonary edema. Worsening from prior exam. Small left pleural effusion. No pneumothorax. IMPRESSION: 1.  Lines and tubes in stable position. 2. Prior CABG. Cardiomegaly with worsening bilateral pulmonary edema. Small left pleural effusion. Electronically Signed   By: Marcello Moores  Register   On: 11/11/2015 07:17   Dg Chest Port 1 View  11/10/2015  CLINICAL DATA:  Shortness of breath . EXAM: PORTABLE CHEST 1 VIEW COMPARISON:  11/09/2015. FINDINGS: Endotracheal tube, NG tube, right PICC line, left subclavian line in stable position. Prior CABG. Cardiomegaly. Left lower lobe atelectasis and/or infiltrate with small left pleural effusion. Mild right base atelectasis and or infiltrate. No pneumothorax . IMPRESSION: 1.  Lines and tubes in stable position. 2. Left lower lobe atelectasis and/or infiltrate with small left pleural effusion. Mild right base atelectasis and/or infiltrate. Findings stable from prior exam. 3.  Prior CABG.  Stable cardiomegaly. Electronically Signed   By: Marcello Moores  Register   On: 11/10/2015 07:51   Dg Chest Port 1 View  11/09/2015  CLINICAL DATA:  Atelectasis. EXAM: PORTABLE CHEST 1 VIEW COMPARISON:  11/08/2015 FINDINGS: Endotracheal tube in satisfactory position. Right arm PICC tip in the lower SVC unchanged. Left subclavian central venous catheter in the SVC. NG tube and feeding tube enters the stomach with the tips not  visualized. Negative for pneumothorax. Left lower lobe airspace disease unchanged. Small left effusion unchanged. Mild pulmonary vascular congestion unchanged. IMPRESSION: Support lines remain in satisfactory position and unchanged Left lower lobe atelectasis/ infiltrate unchanged. Mild right lower lobe airspace consolidation stable. Mild vascular congestion unchanged. Electronically Signed   By: Franchot Gallo M.D.   On: 11/09/2015 07:36   Dg Chest Port 1 View  11/08/2015  CLINICAL DATA:  Respiratory failure, hypertension, coronary artery disease post MI and CABG EXAM: PORTABLE CHEST 1 VIEW COMPARISON:  Portable exam 1216 hours compared to 11/06/2015 FINDINGS: Tip of endotracheal tube projects 6.8 cm above carina. Nasogastric tube and feeding tube extends into stomach. LEFT subclavian central venous catheter with tip projecting over SVC. RIGHT arm PICC line tip projects over SVC. Enlargement of cardiac silhouette post CABG. Stable mediastinal contours. Increased LEFT lower lobe consolidation. Slightly increased infiltrate at RIGHT base  as well. Minimal central peribronchial thickening. No pleural effusion or pneumothorax. IMPRESSION: Enlargement of cardiac silhouette post CABG. Stable line and tube positions. Increased LEFT lower lobe consolidation with minimally increased infiltrate at RIGHT base as well. Findings are suspicious for pneumonia involving the LEFT lower lobe though a component of coexisting pulmonary edema may be present. Electronically Signed   By: Lavonia Dana M.D.   On: 11/08/2015 12:47   Dg Chest Port 1 View  11/06/2015  CLINICAL DATA:  Acute CHF secondary to acute MI, complete heart block. EXAM: PORTABLE CHEST 1 VIEW COMPARISON:  Portable chest x-ray of November 05, 2015 FINDINGS: The lungs are adequately inflated. The pulmonary interstitial markings are less prominent on the right today. They have improved on the left as well and the hemidiaphragm is now visible. There is persistent  interstitial edema on the left. The cardiac silhouette remains enlarged. The intraaortic balloon pump marker tip projects over the proximal portion of descending thoracic aorta and appears stable. The endotracheal tube tip projects 5.5 cm above the carina. The nasogastric and Doppler AH feeding tubes have their tips below the inferior margin of the image. The right-sided PICC line tip projects over the junction of the middle and distal thirds of the SVC. The left subclavian venous catheter tip projects over the midportion of the SVC. IMPRESSION: Decreasing pulmonary edema consistent with improvement in CHF. Persistent perihilar interstitial edema on the left. The support tubes and devices are in reasonable position. Electronically Signed   By: David  [place] M.D.   On: 11/06/2015 07:32   Dg Chest Port 1 View  11/05/2015  CLINICAL DATA:  Respiratory failure. EXAM: PORTABLE CHEST 1 VIEW COMPARISON:  11/04/2015 . FINDINGS: Endotracheal tube, NG tube, right PICC line stable position. Prior CABG. Cardiomegaly. Persistent left mid and lower lung field infiltrate. Persistent mild atelectatic changes right upper and right lower lobes. No pneumothorax. IMPRESSION: 1.  Lines and tubes in stable position. 2. Persistent left mid and left lower lung infiltrate, no interim change. Persistent right upper lobe and right lower lobe subsegmental atelectasis. No interim change. 3. Prior CABG.  Stable cardiomegaly. Electronically Signed   By: Marcello Moores  Register   On: 11/05/2015 07:48   Dg Chest Port 1 View  11/04/2015  CLINICAL DATA:  Hypoxia EXAM: PORTABLE CHEST 1 VIEW COMPARISON:  November 04, 2015 FINDINGS: Endotracheal tube tip is 5.3 cm above the carina. Central catheter tip is in the superior vena cava near the cavoatrial junction. Nasogastric tube tip and side port are below the diaphragm. There also is a feeding tube with the tip below the diaphragm. No pneumothorax. There has been removal of a left chest tube. There is  airspace consolidation throughout the left mid and lower lung zones, stable. There is mild atelectasis in the right upper lobe, stable. No new opacity is evident. There is stable cardiomegaly. The pulmonary vascularity is normal. No adenopathy is evident. IMPRESSION: Tube and catheter positions as described without pneumothorax. Airspace consolidation throughout the left mid and lower lung zones, stable. Atelectasis right upper lobe, stable. No new opacity. Stable cardiomegaly. Electronically Signed   By: Lowella Grip III M.D.   On: 11/04/2015 14:52   Dg Chest Port 1 View  11/04/2015  CLINICAL DATA:  Status post CABG 10/28/2015. EXAM: PORTABLE CHEST 1 VIEW COMPARISON:  Single view of the chest 11/03/2015 and 11/02/2015. FINDINGS: Support tubes and lines including a left chest tube are unchanged since the most recent exam. The chest is better expanded today with  decreased atelectasis. Airspace opacity in the left mid and lower lung zones persists. There is likely a left pleural effusion. Marked cardiomegaly is seen with only mild vascular congestion noted. IMPRESSION: Increased pulmonary expansion with decreased scattered atelectasis. Left mid and lower lung zone airspace disease could be due to atelectasis or pneumonia. Small left pleural effusion noted. Support tubes and lines projecting good position.  No pneumothorax. Electronically Signed   By: Inge Rise M.D.   On: 11/04/2015 07:30   Dg Chest Port 1 View  11/03/2015  CLINICAL DATA:  Central line placement. EXAM: PORTABLE CHEST 1 VIEW COMPARISON:  11/03/2015. FINDINGS: Left subclavian central line noted with tip at the cavoatrial junction. Interval removal of right chest tube. Endotracheal tube, NG tube, right PICC line, left IJ sheath left chest tube in stable position. No pneumothorax. Prior CABG. Stable cardiomegaly. Low lung volumes. A mild component of congestive heart failure cannot be excluded. Similar findings noted on prior exam P  IMPRESSION: 1. Interim placement of left subclavian central line, its tip is at the cavoatrial junction. Interim removal right chest tube . No pneumothorax. 2. Remaining lines and tubes including left chest tube in stable position. 3. Prior CABG. Stable cardiomegaly. Low lung volumes. A a mild component congestive heart failure cannot be excluded . Similar findings noted on prior exam. Electronically Signed   By: [place]   On: 11/03/2015 09:58   Dg Chest Port 1 View  11/03/2015  CLINICAL DATA:  Status post CABG on October 27, 2015 persistent left lower lobe atelectasis or pneumonia. No pneumothorax or significant pleural effusion. EXAM: PORTABLE CHEST 1 VIEW COMPARISON:  Portable chest x-ray of November 02, 2015 at 5:48 a.m. FINDINGS: There has been interval placement of an intra aortic balloon pump whose marker lies over the junction of the aortic arch with the proximal descending thoracic aorta. The cardiac silhouette remains enlarged. The pulmonary vascularity remains mildly prominent. The retrocardiac region on the left remains dense. Bilateral chest tubes are in stable position. There is no pneumothorax nor significant pleural effusion. The endotracheal tube tip lies approximately 6.5 cm above the carina. The esophagogastric tube tip projects below the inferior margin of the image. The right-sided PICC line tip projects over the midportion of the SVC. A previously demonstrated pericardial drain is not clearly evident on today's study. IMPRESSION: Mild CHF. Persistent left lower lobe atelectasis or pneumonia. Interval placement of an intra aortic balloon pump which appears to be in appropriate position radiographically. The other support tubes and lines are in reasonable position. Electronically Signed   By: David  Martinique M.D.   On: 11/03/2015 07:27   Dg Chest Port 1 View  11/02/2015  CLINICAL DATA:  CABG. EXAM: PORTABLE CHEST 1 VIEW COMPARISON:  11/01/2015. FINDINGS: Endotracheal tube, NG tube, right  PICC line, bilateral chest tubes in stable position. Prior CABG. Stable cardiomegaly . Persistent mild interstitial prominence, congestive heart failure cannot be excluded. No interim change. Low lung volumes with basilar atelectasis . IMPRESSION: 1. Endotracheal tube, NG tube, right PICC line, bilateral chest tubes in stable position . No pneumothorax. 2. Prior CABG. Cardiomegaly with pulmonary interstitial prominence and small left pleural effusion consistent with mild congestive heart failure. No interim change from prior exam . Electronically Signed   By: Marcello Moores  Register   On: 11/02/2015 07:20   Dg Chest Port 1 View  11/01/2015  CLINICAL DATA:  Post CABG EXAM: PORTABLE CHEST 1 VIEW COMPARISON:  10/31/2015 FINDINGS: Cardiomediastinal silhouette is stable. Status post CABG.  Stable endotracheal and NG tube position. Left chest tube is unchanged in position. Persistent central vascular congestion and mild interstitial prominence bilaterally suspicious for the mild interstitial edema. Left IJ sheath in place. Probable small left pleural effusion left basilar atelectasis or infiltrate. Right arm PICC line is unchanged in position. There is no pneumothorax. IMPRESSION: Status post CABG. Stable support apparatus. Stable left chest tube position. No pneumothorax. Again noted bilateral mild interstitial prominence and perihilar opacities suspicious for mild pulmonary edema. Probable small left pleural effusion left basilar atelectasis or infiltrate. Electronically Signed   By: Lahoma Crocker M.D.   On: 11/01/2015 09:57   Dg Chest Port 1 View  10/31/2015  CLINICAL DATA:  55 year old male with a history of endotracheal tube placement Patient has undergone coronary artery bypass grafting x4 10/27/2015. Placement of intra aortic balloon pump at this date. Ischemic cardiomyopathy. EXAM: PORTABLE CHEST 1 VIEW COMPARISON:  10/30/2015. FINDINGS: Endotracheal tube terminates approximately 3.4 cm above the carina, unchanged.  Gastric tube terminates out of the field of view. Defibrillator pads project over the left and right chest. Bilateral thoracostomy tubes. Right upper extremity PICC. The mediastinal drain not visualized. Intra-aortic balloon pump not visualized. Surgical changes of median sternotomy and CABG. Mixed bilateral interstitial and airspace opacities. Low lung volumes. IMPRESSION: Low lung volumes with mixed bilateral interstitial and airspace opacities, similar to the comparison chest x-ray. Retrocardiac opacity may reflect persistent pleural fluid, and/or atelectasis. Surgical support apparatus appear unchanged, as above. Intra aortic balloon pump not visualized. Signed, Dulcy Fanny. Earleen Newport, DO [facility] Radiology Electronically Signed   By: Corrie Mckusick D.O.   On: 10/31/2015 07:25   Dg Chest Port 1 View  10/30/2015  CLINICAL DATA:  Post cavity.  Recent CABG. EXAM: PORTABLE CHEST 1 VIEW COMPARISON:  Portable film earlier today. FINDINGS: Overlying support apparatus redemonstrated. Tubes and lines remain stable. Elevated LEFT hemidiaphragm. No definite pneumothorax. Moderate vascular congestion may be increased. Cardiomegaly. IMPRESSION: Slight worsening aeration. Moderate vascular congestion may be increased. Electronically Signed   By: Staci Righter M.D.   On: 10/30/2015 18:39   Dg Chest Port 1 View  10/30/2015  CLINICAL DATA:  Status post coronary bypass grafting EXAM: PORTABLE CHEST 1 VIEW COMPARISON:  10/30/2015 FINDINGS: Cardiac shadow remains enlarged. Bilateral chest tubes are again seen. A right-sided PICC line and endotracheal tube are noted in satisfactory position. Left jugular sheath is noted. Elevation of left hemidiaphragm is noted with left basilar atelectasis. No pneumothorax is noted. IMPRESSION: Postsurgical change with tubes and lines as described. Mild left basilar atelectasis. Electronically Signed   By: Inez Catalina M.D.   On: 10/30/2015 07:45    Dg Chest Port 1 View  10/30/2015  CLINICAL DATA:  A chest tube in place EXAM: PORTABLE CHEST 1 VIEW COMPARISON:  10/30/2015 FINDINGS: Postoperative changes in the mediastinum. Endotracheal tube with tip measuring 6.3 cm about the carina. Right PICC catheter with tip over the cavoatrial junction. Left central venous catheter sheath with tip over the left neck consistent location in the internal jugular vein. Bilateral chest tubes. Infiltration or atelectasis in the right lung base. Cardiac enlargement. No significant vascular congestion. No pneumothorax. IMPRESSION: Appliance positioned as described. Cardiac enlargement with infiltration or atelectasis in the right lung base. Bilateral chest tubes are present but no visualized pneumothorax. Electronically Signed   By: Lucienne Capers M.D.   On: 10/30/2015 03:07   Dg Chest Port 1 View  10/30/2015  CLINICAL DATA:  Shortness of breath.  Code blue. EXAM: PORTABLE CHEST 1 VIEW COMPARISON:  10/29/2015 FINDINGS: Postoperative changes in the mediastinum. Bilateral chest tubes are present. Right PICC line with tip over the cavoatrial junction. Left central venous catheter or sheath with tip over the left side of the neck, likely in the left internal jugular vein. Cardiac enlargement without significant vascular congestion. Shallow inspiration with elevation of the left hemidiaphragm. Probable left pleural effusion. No definite pulmonary consolidation. IMPRESSION: Shallow inspiration. Probable left pleural effusion. Cardiac enlargement. Appliances appear in satisfactory position. Electronically Signed   By: Lucienne Capers M.D.   On: 10/30/2015 01:18   Dg Chest Port 1 View  10/29/2015  CLINICAL DATA:  Postop CABG 2 days ago. EXAM: PORTABLE CHEST 1 VIEW COMPARISON:  Portable chest x-ray of October 28, 2015 FINDINGS: The trachea and esophagus have been extubated. The cardiac silhouette remains enlarged. The pulmonary vascularity is mildly engorged. The bilateral chest  tubes and the mediastinal drain are in stable position. There is no pneumothorax or large pleural effusion. The Swan-Ganz catheter tip overlies a proximal right lower lobe pulmonary artery branch. The mediastinum is widened and accentuated by the hypo inflation. IMPRESSION: Bilateral hypoinflation following extubation of the trachea there is crowding of the pulmonary vascularity. Left basilar atelectasis or less likely pneumonia is more prominent today. Probable small left pleural effusion. The remaining support tubes and lines are in stable position. Electronically Signed   By: David  Martinique M.D.   On: 10/29/2015 07:40   Dg Chest Port 1 View  10/28/2015  CLINICAL DATA:  Post CABG, on ventilator, followup portable chest x-ray of 10/28/2015 EXAM: PORTABLE CHEST 1 VIEW COMPARISON:  None. FINDINGS: The tip of the endotracheal tube is approximately 5.2 cm above the carina. Aeration has improved slightly. Atelectasis remains at the lung bases left-greater-than-right. Swan-Ganz catheter tip is in the right lower lobe pulmonary artery and bilateral chest tubes remain. No pneumothorax is seen. Heart size is stable. IMPRESSION: 1. Improved aeration. 2. Bilateral chest tubes.  No pneumothorax. 3. Tip of endotracheal tube approximately 5.2 cm above the carina. Electronically Signed   By: Ivar Drape M.D.   On: 10/28/2015 08:01   Dg Chest Port 1 View  10/27/2015  CLINICAL DATA:  Status post CABG EXAM: PORTABLE CHEST 1 VIEW COMPARISON:  10/27/2015 FINDINGS: Unchanged tracheostomy tube. Swan-Ganz central venous line is now seen with tip advanced into the right perihilar region. Stable right chest tube with and left chest tube. NG tube again crosses the gastroesophageal junction. Stable mediastinal drain. Enlarged cardiac silhouette. Limited inspiratory effect with bibasilar opacities likely representing atelectasis. IMPRESSION: Anticipated postoperative appearance with bibasilar atelectasis. Electronically Signed   By:  Skipper Cliche M.D.   On: 10/27/2015 19:25   Dg Chest Portable 1 View  10/27/2015  CLINICAL DATA:  Status post coronary bypass grafting EXAM: PORTABLE CHEST 1 VIEW COMPARISON:  10/23/2015 FINDINGS: Cardiac shadow is mildly enlarged. A Swan-Ganz catheter is noted in the right pulmonary outflow tract. Bilateral thoracostomy catheters are seen. No pneumothorax is noted. An endotracheal tube is noted in satisfactory position. Intra-aortic balloon pump is noted over the aortic arch. Mild left basilar atelectasis is seen. Mild central vascular congestion is noted. IMPRESSION: Postoperative changes with tubes and lines as described above. Mild vascular congestion and left basilar atelectasis. Electronically Signed   By: Inez Catalina M.D.   On: 10/27/2015 17:23   Dg Abd Portable 1v  11/19/2015  CLINICAL DATA:  Encounter for enteric feeding tube placement. EXAM: PORTABLE ABDOMEN - 1 VIEW  COMPARISON:  11/18/2015 FINDINGS: Enteric feeding tube tip now projects in the fourth portion of the duodenum, near the ligament of Treitz. There is small bowel dilation which appears increased from the prior radiographs. IMPRESSION: Enteric feeding tube metallic tip now projects in the fourth portion of the duodenum near the ligament of Treitz. Increased small bowel dilation which may reflect an adynamic ileus or partial obstruction this is only partly imaged on this exam. Electronically Signed   By: Lajean Manes M.D.   On: 11/19/2015 10:24   Dg Abd Portable 1v  11/18/2015  CLINICAL DATA:  55 year old male feeding tube placement. Initial encounter. EXAM: PORTABLE ABDOMEN - 1 VIEW COMPARISON:  11/16/2015. FINDINGS: Portable AP supine view at 1109 hours. A enteric tube placed, tip at the level of the distal second portion of the duodenum. Epicardial pacer wires remain in place. Increased gaseous distension of large bowel in the abdomen, but no dilated small bowel loops. Mild gaseous distension of the stomach. No definite  pneumoperitoneum on this supine view. Stable visualized osseous structures. IMPRESSION: 1. Enteric tube placed, it tip at the mid duodenum. 2. Increased large bowel gas, favor ileus over mechanical obstruction. Electronically Signed   By: Genevie Ann M.D.   On: 11/18/2015 12:01   Dg Abd Portable 1v  11/16/2015  CLINICAL DATA:  Ileus, acute MI with CHF EXAM: PORTABLE ABDOMEN - 1 VIEW COMPARISON:  Portable abdominal films of November 15, 2015 FINDINGS: There remain small amounts of gas within small bowel loops as well as and large bowel loops. No abnormally distended bowel loops are observed. There is gas in the stomach. A feeding tube is in place whose tip appears to lie at the duodenal -jejunal junction. IMPRESSION: Fairly stable appearance of the abdomen with moderate amounts of gas within small and large bowel loops consistent with ileus. No free extraluminal gas collections are observed. Electronically Signed   By: David  Martinique M.D.   On: 11/16/2015 07:20   Dg Abd Portable 1v  11/15/2015  CLINICAL DATA:  Chronic ventilator dependent respiratory failure. Acute onset of nausea and vomiting. EXAM: PORTABLE ABDOMEN - 1 VIEW COMPARISON:  11/03/2015, 11/02/2015. FINDINGS: Feeding tube tip projects at the expected location of the 4th portion of the duodenum at or near the ligament Treitz. Nonobstructive bowel gas pattern. Gas in normal caliber loops of small bowel throughout the abdomen and pelvis. Gas throughout normal caliber colon from cecum to rectum. No suggestion of free intraperitoneal air. IMPRESSION: 1. Bowel gas pattern indicating a mild generalized ileus. No evidence of bowel obstruction. 2. Feeding tube tip appropriately positioned in the 4th portion of the duodenum at or near the ligament of Treitz. Electronically Signed   By: Evangeline Dakin M.D.   On: 11/15/2015 07:26   Dg Abd Portable 1v  11/02/2015  CLINICAL DATA:  Assess feeding tube positioning EXAM: PORTABLE ABDOMEN - 1 VIEW COMPARISON:  None in  PACs FINDINGS: The repeated KUB with has less motion artifact. The tip of the feeding tube and nasogastric tube lie in the region of the distal gastric body in the pre-pyloric region. The bowel gas pattern is within the limits of normal. Numerous tubes and lines and electrodes overlie the abdomen. IMPRESSION: The tips of the feeding tube and the esophagogastric tube lie in the region of the distal gastric body in the pre-pyloric region. Electronically Signed   By: David  Martinique M.D.   On: 11/02/2015 12:16   Dg Loyce Dys Tube Plc W/fl-no Rad  11/03/2015  CLINICAL DATA:  NASO G TUBE PLACEMENT WITH FLUORO Fluoroscopy was utilized by the requesting physician.  No radiographic interpretation.    PHYSICAL EXAM CVP 15 General: Sitting up in chair. Using PM valvle Neck: JVP hard to see.  no thyromegaly or thyroid nodule.  S/p Trach  Lungs:clear CV: Nondisplaced PMI.  Heart regular S1/S2, no S3/S4, no murmur. 1-2+ edema. Abdomen: obese Nontender. Good BS.   Neurologic: alert and oriented x 3 Extremities: No clubbing or cyanosis.    TELEMETRY: NSR 60s  ASSESSMENT AND PLAN:  1. CAD: S/p late presentation inferior MI and CABG x 4. Continue statin, ASA. Coronary angiography 3/27 with multiple VT episodes showed patent LIMA-LAD and SVG-RCA.  Patent SVG-ramus and SVG-D but slow flow in both grafts with diffusely diseased small target vessels.  2. Cardiogenic shock: Acute systolic CHF with RV failure post inferior MI with suspected RV involvement. EF 25-30% on echo (difficult images on 3/24 echo). He has significant RV failure from suspected RV infarct.  3/27 IABP placed to try to allow decrease in milrinone with frequent VT.  3/31 echo with EF 30-35%.  IABP out 4/2.  Milrinone off 4/7 - Doing well off milrinone - CVP remains up likely due to RV failure.  - Given hypernatremia will switch NS to D5W. Stop IV lasix and switch to torsemide to see how he does. Having trouble with 400cc Free H20. Boluses. Will  cut to 200.  - Continue UNA boots. - No spiro yet with CKD. Can consider careful b-blocker initiation soon  - Continue sildenafil 40 tid.   - Continue hydralazine 50 mg tid for afterload reduction (no Imdur with sildenafil use). SBP 95-110 so will not tolerate  3. Complete heart block: Resolved.  4. VF arrest 3/24 early am and again at night 3/24.  Ectopy quiescent. - Continue po amio 5. AKI: Creatinine improving. Now 1.6 6. ID: Possible LLL PNA.  He completed course of  vancomycin/levofloxacin.  7. Pulmonary: S/P Trach 4/5 on trach collar. Has PM valve. Swallow study today. 8. Hypernatremia: see abovve 9. 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 He was off AC due to trach bleeding. On DVT prophylaxis. Will not start coumadin unless AF/AFL recurs. 10. Deconditioning - continue PT  Glori Bickers MD  11/21/2015 8:17 AM

## 2015-11-21 NOTE — Progress Notes (Signed)
Patient ID: Christopher [name], male   DOB: 1961-02-15, 55 y.o.   MRN: [id number] EVENING ROUNDS NOTE :     [address]       [place] [address]             [phone]                 19 Days Post-Op Procedure(s) (LRB): Right/Left Heart Cath and Coronary/Graft Angiography (N/A) Intra-Aortic Balloon Pump Insertion  Total Length of Stay:  LOS: 32 days  BP 113/83 mmHg  Pulse 81  Temp(Src) 97.6 F (36.4 C) (Oral)  Resp 19  Ht 5' 10.5" (1.791 m)  Wt 235 lb 3.7 oz (106.7 kg)  BMI 33.26 kg/m2  SpO2 97%  .Intake/Output      04/15 0701 - 04/16 0700   I.V. (mL/kg) 50 (0.5)   NG/GT 1150   Total Intake(mL/kg) 1200 (11.2)   Urine (mL/kg/hr) 2470 (1.8)   Stool 0 (0)   Total Output 2470   Net -1270       Urine Occurrence 1 x   Stool Occurrence 1 x     . sodium chloride Stopped (11/15/15 0600)  . dextrose 50 mL (11/21/15 1723)  . feeding supplement (VITAL HIGH PROTEIN) 1,000 mL (11/21/15 0600)     Lab Results  Component Value Date   WBC 9.1 11/21/2015   HGB 9.9* 11/21/2015   HCT 34.0* 11/21/2015   PLT 223 11/21/2015   GLUCOSE 107* 11/21/2015   CHOL 150 10/21/2015   TRIG 171* 10/21/2015   HDL 18* 10/21/2015   LDLCALC 98 10/21/2015   ALT 51 11/17/2015   AST 50* 11/17/2015   NA 148* 11/21/2015   K 4.1 11/21/2015   CL 111 11/21/2015   CREATININE 1.68* 11/21/2015   BUN 54* 11/21/2015   CO2 26 11/21/2015   TSH 7.432* 10/30/2015   INR 1.44 11/11/2015   HGBA1C 6.2* 10/23/2015   Still aspirating on swallow test today  Delight Ovens MD  Beeper [phone] Office [phone] 11/21/2015 7:31 PM

## 2015-11-21 NOTE — Progress Notes (Signed)
Patient ID: Christopher [name], male   DOB: 03/07/1961, 55 y.o.   MRN: [id number] TCTS DAILY ICU PROGRESS NOTE                   [address]            [place] [address]          [phone]   19 Days Post-Op Procedure(s) (LRB): Right/Left Heart Cath and Coronary/Graft Angiography (N/A) Intra-Aortic Balloon Pump Insertion  Total Length of Stay:  LOS: 32 days   Subjective: Up in chair , talking without difficulity  Objective: Vital signs in last 24 hours: Temp:  [97.9 F (36.6 C)-98.4 F (36.9 C)] 97.9 F (36.6 C) (04/15 0700) Pulse Rate:  [71-90] 82 (04/15 0925) Cardiac Rhythm:  [-] Normal sinus rhythm (04/15 0400) Resp:  [15-25] 22 (04/15 0925) BP: (99-132)/(67-87) 125/87 mmHg (04/15 0925) SpO2:  [90 %-100 %] 100 % (04/15 0925) FiO2 (%):  [40 %-80 %] 40 % (04/15 0925) Weight:  [235 lb 3.7 oz (106.7 kg)] 235 lb 3.7 oz (106.7 kg) (04/15 0500)  Filed Weights   11/19/15 0600 11/20/15 0500 11/21/15 0500  Weight: 236 lb 15.9 oz (107.5 kg) 235 lb 7.2 oz (106.8 kg) 235 lb 3.7 oz (106.7 kg)    Weight change: -3.5 oz (-0.1 kg)   Hemodynamic parameters for last 24 hours: CVP:  [16 mmHg-18 mmHg] 16 mmHg  Intake/Output from previous day: 04/14 0701 - 04/15 0700 In: 3655.8 [I.V.:947.5; NG/GT:2708.3] Out: 3695 [Urine:3695]  Intake/Output this shift:    Current Meds: Scheduled Meds: . amiodarone  200 mg Oral BID  . antiseptic oral rinse  7 mL Mouth Rinse QID  . aspirin  81 mg Oral Daily  . atorvastatin  80 mg Oral q1800  . chlorhexidine gluconate (SAGE KIT)  15 mL Mouth Rinse BID  . enoxaparin (LOVENOX) injection  40 mg Subcutaneous Q24H  . feeding supplement (PRO-STAT SUGAR FREE 64)  30 mL Per Tube TID  . free water  200 mL Per Tube Q4H  . hydrALAZINE  25 mg Oral 3 times per day  . insulin aspart  0-24 Units Subcutaneous 6 times per day  . insulin detemir  12 Units Subcutaneous BID  . levalbuterol  1.25 mg Nebulization TID  . pantoprazole sodium  40  mg Per Tube Daily  . potassium chloride  40 mEq Oral 3 times per day  . QUEtiapine  50 mg Oral QHS  . sildenafil  40 mg Oral TID  . silver sulfADIAZINE   Topical BID  . sodium chloride flush  10-40 mL Intracatheter Q12H  . torsemide  60 mg Oral Daily  . triamcinolone cream   Topical BID   Continuous Infusions: . sodium chloride Stopped (11/15/15 0600)  . dextrose Stopped (11/20/15 2000)  . feeding supplement (VITAL HIGH PROTEIN) 1,000 mL (11/21/15 0600)   PRN Meds:.diphenhydrAMINE, fentaNYL (SUBLIMAZE) injection, levalbuterol, midazolam, ondansetron (ZOFRAN) IV, sodium chloride flush, traMADol    Lab Results: CBC: Recent Labs  11/20/15 0445 11/21/15 0444  WBC 9.9 9.1  HGB 10.3* 9.9*  HCT 34.3* 34.0*  PLT 215 223   BMET:  Recent Labs  11/20/15 0445 11/21/15 0444  NA 149* 148*  K 4.3 4.1  CL 114* 111  CO2 26 26  GLUCOSE 136* 107*  BUN 58* 54*  CREATININE 1.73* 1.68*  CALCIUM 8.6* 8.4*    PT/INR: No results for input(s): LABPROT, INR in the last 72 hours. Radiology: Dg Chest [facility]  1 View  11/21/2015  CLINICAL DATA:  55 year old male with admission 10/20/2015 for shortness of breath and coronary artery disease. Combined carotid endarterectomy and CABG performed 10/27/2015. EXAM: PORTABLE CHEST 1 VIEW COMPARISON:  Multiple prior, most recent 11/20/2015 FINDINGS: Surgical changes of median sternotomy and CABG. Unchanged right upper extremity PICC. Unchanged tracheostomy. Unchanged left subclavian central venous catheter. Improved aeration on the left upper lung with persisting left basilar opacity obscuring the left hemidiaphragm and the left heart border. Persisting interstitial and airspace disease at the right base. No visualized pneumothorax. IMPRESSION: Improving aeration on the left, with persisting basilar opacity, potentially combination of pleural fluid, atelectasis/ consolidation, and/ or edema. Mixed airspace disease at the right base, potentially atelectasis or  consolidation. Unchanged support apparatus as above. Signed, Dulcy Fanny. Earleen Newport, DO [facility] Radiology Electronically Signed   By: Corrie Mckusick D.O.   On: 11/21/2015 09:40     Assessment/Plan: S/P Procedure(s) (LRB): Right/Left Heart Cath and Coronary/Graft Angiography (N/A) Intra-Aortic Balloon Pump Insertion Slowly improving, patient anxious to see speech therapy and pass swallowing test  Hyper natremia    Grace Isaac 11/21/2015 11:06 AM

## 2015-11-21 NOTE — Progress Notes (Signed)
MBS report available under the imaging section of the notes  Marcene Duos MA, CCC-SLP Acute Care Speech Language Pathologist

## 2015-11-22 LAB — CBC
HCT: 34.9 % — ABNORMAL LOW (ref 39.0–52.0)
HCT: 35.1 % — ABNORMAL LOW (ref 39.0–52.0)
Hemoglobin: 11.4 g/dL — ABNORMAL LOW (ref 13.0–17.0)
Hemoglobin: 10.9 g/dL — ABNORMAL LOW (ref 13.0–17.0)
MCH: 30.7 pg (ref 26.0–34.0)
MCH: 28.6 pg (ref 26.0–34.0)
MCHC: 32.7 g/dL (ref 30.0–36.0)
MCHC: 31.1 g/dL (ref 30.0–36.0)
MCV: 94.1 fL (ref 78.0–100.0)
MCV: 92.1 fL (ref 78.0–100.0)
Platelets: 353 10*3/uL (ref 150–400)
Platelets: 247 10*3/uL (ref 150–400)
RBC: 3.71 MIL/uL — ABNORMAL LOW (ref 4.22–5.81)
RBC: 3.81 MIL/uL — ABNORMAL LOW (ref 4.22–5.81)
RDW: 14.5 % (ref 11.5–15.5)
RDW: 17.7 % — ABNORMAL HIGH (ref 11.5–15.5)
WBC: 18.6 10*3/uL — ABNORMAL HIGH (ref 4.0–10.5)
WBC: 9.8 10*3/uL (ref 4.0–10.5)

## 2015-11-22 LAB — GLUCOSE, CAPILLARY
GLUCOSE-CAPILLARY: 92 mg/dL (ref 65–99)
GLUCOSE-CAPILLARY: 100 mg/dL — AB (ref 65–99)
GLUCOSE-CAPILLARY: 93 mg/dL (ref 65–99)
Glucose-Capillary: 96 mg/dL (ref 65–99)
Glucose-Capillary: 100 mg/dL — ABNORMAL HIGH (ref 65–99)

## 2015-11-22 LAB — BASIC METABOLIC PANEL
ANION GAP: 8 (ref 5–15)
Anion gap: 9 (ref 5–15)
BUN: 13 mg/dL (ref 6–20)
BUN: 51 mg/dL — ABNORMAL HIGH (ref 6–20)
CALCIUM: 7.7 mg/dL — AB (ref 8.9–10.3)
CHLORIDE: 96 mmol/L — AB (ref 101–111)
CO2: 31 mmol/L (ref 22–32)
CO2: 26 mmol/L (ref 22–32)
Calcium: 8.7 mg/dL — ABNORMAL LOW (ref 8.9–10.3)
Chloride: 110 mmol/L (ref 101–111)
Creatinine, Ser: 0.56 mg/dL — ABNORMAL LOW (ref 0.61–1.24)
Creatinine, Ser: 1.64 mg/dL — ABNORMAL HIGH (ref 0.61–1.24)
GFR calc Af Amer: 53 mL/min — ABNORMAL LOW (ref >60)
GFR calc non Af Amer: >60 mL/min (ref >60)
GFR calc non Af Amer: 46 mL/min — ABNORMAL LOW (ref >60)
GLUCOSE: 118 mg/dL — AB (ref 65–99)
Glucose, Bld: 115 mg/dL — ABNORMAL HIGH (ref 65–99)
POTASSIUM: 3.4 mmol/L — AB (ref 3.5–5.1)
Potassium: 4.8 mmol/L (ref 3.5–5.1)
Sodium: 135 mmol/L (ref 135–145)
Sodium: 145 mmol/L (ref 135–145)

## 2015-11-22 LAB — MAGNESIUM
Magnesium: 1.9 mg/dL (ref 1.7–2.4)
Magnesium: 2 mg/dL (ref 1.7–2.4)

## 2015-11-22 LAB — CARBOXYHEMOGLOBIN
Carboxyhemoglobin: 1.3 % (ref 0.5–1.5)
Methemoglobin: 0.7 % (ref 0.0–1.5)
O2 Saturation: 66.5 %
Total hemoglobin: 11 g/dL — ABNORMAL LOW (ref 13.5–18.0)
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
## 2015-11-22 NOTE — Progress Notes (Addendum)
Patient ID: Christopher [name], male   DOB: 05-19-1961, 55 y.o.   MRN: [id number]   ADVANCED HF ROUNDING NOTE  Subjective:  Sitting in chair. Walked whole unit twice yesterday. Has PM valve. Failed swallow study yesterday. Very eager to eat. No VT.  Switched to po demadex yesterday. On D5W.  Sodium 145. CVP 15. Co-ox 67%. Cr stable at 1.6   Scheduled Meds: . amiodarone  200 mg Oral BID  . antiseptic oral rinse  7 mL Mouth Rinse QID  . aspirin  81 mg Oral Daily  . atorvastatin  80 mg Oral q1800  . chlorhexidine gluconate (SAGE KIT)  15 mL Mouth Rinse BID  . enoxaparin (LOVENOX) injection  40 mg Subcutaneous Q24H  . feeding supplement (PRO-STAT SUGAR FREE 64)  30 mL Per Tube TID  . free water  200 mL Per Tube Q4H  . hydrALAZINE  25 mg Oral 3 times per day  . insulin aspart  0-24 Units Subcutaneous 6 times per day  . insulin detemir  12 Units Subcutaneous BID  . levalbuterol  1.25 mg Nebulization TID  . pantoprazole sodium  40 mg Per Tube Daily  . potassium chloride  40 mEq Oral 3 times per day  . QUEtiapine  50 mg Oral QHS  . sildenafil  40 mg Oral TID  . silver sulfADIAZINE   Topical BID  . sodium chloride flush  10-40 mL Intracatheter Q12H  . torsemide  60 mg Oral Daily  . triamcinolone cream   Topical BID   Continuous Infusions: . sodium chloride Stopped (11/15/15 0600)  . dextrose 50 mL/hr at 11/22/15 0600  . feeding supplement (VITAL HIGH PROTEIN) 1,000 mL (11/22/15 0600)   PRN Meds:.diphenhydrAMINE, fentaNYL (SUBLIMAZE) injection, levalbuterol, midazolam, ondansetron (ZOFRAN) IV, sodium chloride flush, traMADol    Filed Vitals:   11/22/15 0455 11/22/15 0500 11/22/15 0600 11/22/15 0700  BP:  125/84 116/78   Pulse:  83 81   Temp:    97.6 F (36.4 C)  TempSrc:    Oral  Resp:  20 19   Height:      Weight: 105.6 kg (232 lb 12.9 oz)     SpO2:  96% 98%     Intake/Output Summary (Last 24 hours) at 11/22/15 0833 Last data filed at 11/22/15 0600  Gross per 24 hour    Intake   2450 ml  Output   3570 ml  Net  -1120 ml    LABS: Basic Metabolic Panel:  Recent Labs  11/22/15 0415 11/22/15 0534  NA 135 145  K 3.4* 4.8  CL 96* 110  CO2 31 26  GLUCOSE 118* 115*  BUN 13 51*  CREATININE 0.56* 1.64*  CALCIUM 7.7* 8.7*  MG 1.9 2.0   Liver Function Tests: No results for input(s): AST, ALT, ALKPHOS, BILITOT, PROT, ALBUMIN in the last 72 hours. No results for input(s): LIPASE, AMYLASE in the last 72 hours. CBC:  Recent Labs  11/22/15 0415 11/22/15 0534  WBC 18.6* 9.8  HGB 11.4* 10.9*  HCT 34.9* 35.1*  MCV 94.1 92.1  PLT 353 247   Cardiac Enzymes: No results for input(s): CKTOTAL, CKMB, CKMBINDEX, TROPONINI in the last 72 hours. BNP: Invalid input(s): POCBNP D-Dimer: No results for input(s): DDIMER in the last 72 hours. Hemoglobin A1C: No results for input(s): HGBA1C in the last 72 hours. Fasting Lipid Panel: No results for input(s): CHOL, HDL, LDLCALC, TRIG, CHOLHDL, LDLDIRECT in the last 72 hours. Thyroid Function Tests: No results for input(s): TSH, T4TOTAL,  T3FREE, THYROIDAB in the last 72 hours.  Invalid input(s): FREET3 Anemia Panel: No results for input(s): VITAMINB12, FOLATE, FERRITIN, TIBC, IRON, RETICCTPCT in the last 72 hours.  RADIOLOGY: Dg Abd 1 View  11/03/2015  CLINICAL DATA:  Enteric tube advanced to post pyloric position. EXAM: ABDOMEN - 1 VIEW COMPARISON:  11/02/2015 abdominal radiographs. FINDINGS: Fluoroscopy time 7 minutes. Enteric tube courses through the stomach and duodenum and appears to terminate near the duodenal jejunal junction. Instilled contrast opacifies nondilated proximal jejunal small bowel loops. No contrast leak is demonstrated. Separate enteric tube terminates in the body of the stomach. Visualized lower sternotomy wires appear aligned and intact. IMPRESSION: Enteric tube appears to terminate near the duodenal jejunal junction. Separate enteric tube terminates in the body of the stomach.  Electronically Signed   By: Ilona Sorrel M.D.   On: 11/03/2015 17:02   Dg Chest Port 1 View  11/21/2015  CLINICAL DATA:  55 year old male with admission 10/20/2015 for shortness of breath and coronary artery disease. Combined carotid endarterectomy and CABG performed 10/27/2015. EXAM: PORTABLE CHEST 1 VIEW COMPARISON:  Multiple prior, most recent 11/20/2015 FINDINGS: Surgical changes of median sternotomy and CABG. Unchanged right upper extremity PICC. Unchanged tracheostomy. Unchanged left subclavian central venous catheter. Improved aeration on the left upper lung with persisting left basilar opacity obscuring the left hemidiaphragm and the left heart border. Persisting interstitial and airspace disease at the right base. No visualized pneumothorax. IMPRESSION: Improving aeration on the left, with persisting basilar opacity, potentially combination of pleural fluid, atelectasis/ consolidation, and/ or edema. Mixed airspace disease at the right base, potentially atelectasis or consolidation. Unchanged support apparatus as above. Signed, Dulcy Fanny. Earleen Newport, DO [facility] Electronically Signed   By: Corrie Mckusick D.O.   On: 11/21/2015 09:40   Dg Chest Port 1 View  11/20/2015  CLINICAL DATA:  Shortness of breath. EXAM: PORTABLE CHEST 1 VIEW COMPARISON:  11/18/2015 . FINDINGS: Tracheostomy tube, right PICC line, feeding tube in stable position. Prior CABG. Cardiomegaly. Worsening left lung infiltrate. Worsening left pleural effusion. Interim partial clearing of right base atelectasis and or infiltrate . No pneumothorax. IMPRESSION: 1. Lines and tubes in stable position. 2. Progressive left lung infiltrate consistent with pneumonia. Progressive left pleural effusion. 3. Interim partial clearing of right base atelectasis and/or infiltrate. 4.  Prior CABG.  Stable severe cardiomegaly. Electronically Signed   By: Marcello Moores  Register   On: 11/20/2015 07:36   Dg  Chest Port 1 View  11/18/2015  CLINICAL DATA:  Respiratory failure. EXAM: PORTABLE CHEST 1 VIEW COMPARISON:  11/17/2015. FINDINGS: Tracheostomy tube, feeding tube, right PICC line, left subclavian line in stable position. Prior CABG. Stable cardiomegaly. Persistent left lower lobe infiltrate/edema again noted. Right base atelectasis and/or infiltrate/edema noted. Small left pleural effusion . These changes may be related to bibasilar pneumonia and/or congestive heart failure . The thorax. IMPRESSION: 1. Lines and tubes in stable position . 2.  Prior CABG.  Persistent cardiomegaly. 3. Persistent left lower lobe infiltrate and small left pleural effusion. Developing right lower lobe infiltrate. These changes may be related congestive heart failure with pulmonary edema. Bilateral pneumonia could also present in this fashion. Electronically Signed   By: Marcello Moores  Register   On: 11/18/2015 07:41   Dg Chest Port 1 View  11/17/2015  CLINICAL DATA:  Shortness of breath. EXAM: PORTABLE CHEST 1 VIEW COMPARISON:  11/16/2015. FINDINGS: Tracheostomy tube, feeding tube, right PICC line, left subclavian central line in stable position. Prior CABG. Cardiomegaly.  Left lower lobe infiltrate a small left pleural effusion. Mild right base infiltrate. No pneumothorax. IMPRESSION: 1.  Lines and tubes in stable position. 2. Persistent left lower lobe infiltrate and left pleural effusion. No interim change. Mild right lower lobe infiltrate. No change from prior exam . 3.  Stable cardiomegaly. Electronically Signed   By: Marcello Moores  Register   On: 11/17/2015 07:19   Dg Chest Port 1 View  11/16/2015  CLINICAL DATA:  Follow-up respiratory failure EXAM: PORTABLE CHEST 1 VIEW COMPARISON:  11/15/2014 FINDINGS: Prior CABG. Tracheostomy tube, right PICC line and left subclavian central line remain in place, unchanged. There is cardiomegaly with vascular congestion. Continued left lower lobe atelectasis or pneumonia and suspect small left pleural  effusion. No significant change since prior study. No confluent opacity on the right. IMPRESSION: Stable left lower lobe atelectasis or pneumonia with small left effusion. Stable cardiomegaly with vascular congestion. Electronically Signed   By: Rolm Baptise M.D.   On: 11/16/2015 07:27   Dg Chest Port 1 View  11/15/2015  CLINICAL DATA:  Chronic ventilator dependent respiratory failure. CABG and right carotid endarterectomy 10/27/2015. EXAM: PORTABLE CHEST 1 VIEW COMPARISON:  11/14/2015 and earlier. FINDINGS: Tracheostomy tube tip in satisfactory position below the thoracic inlet. Left subclavian central venous catheter tip projects over the mid SVC, unchanged. Right arm PICC tip projects over lower SVC at or near the cavoatrial junction, unchanged. Feeding tube courses below the diaphragm into the stomach. Sternotomy for CABG. Cardiac silhouette moderately enlarged. Mild pulmonary venous hypertension without overt edema, unchanged. Dense consolidation in the left lower lobe with air bronchograms, unchanged. Moderate-sized left pleural effusion, unchanged. No new pulmonary parenchymal abnormalities. IMPRESSION: 1.  Support apparatus satisfactory. 2. Stable dense left lower lobe atelectasis and/or pneumonia and left pleural effusion. 3. Stable cardiomegaly and pulmonary venous hypertension without overt edema. 4. No new abnormalities. Electronically Signed   By: Evangeline Dakin M.D.   On: 11/15/2015 07:22   Dg Chest Port 1 View  11/14/2015  CLINICAL DATA:  55 year old male with a history of shortness of breath. Patient admission 10/20/2015. Right carotid endarterectomy 10/27/2015, with same-day coronary artery bypass grafting 10/27/2015. Twelve days postop now with history of intra-aortic balloon pump. EXAM: PORTABLE CHEST 1 VIEW COMPARISON:  11/13/2015, 11/12/2015, 11/11/2015 FINDINGS: Cardiomediastinal silhouette likely unchanged, with the left heart border partially obscured by overlying lung and pleural  disease. Low lung volumes persist with bibasilar opacities and obscuration of the retrocardiac region. Obscuration of left hemidiaphragm. Unchanged tracheostomy tube, enteric feeding tube terminating out of the field of view, right upper extremity PICC and left subclavian central line. IMPRESSION: Similar appearance of the chest x-ray, with persisting bibasilar airspace opacities and left pleural effusion. Unchanged support apparatus as above. Signed, Dulcy Fanny. Earleen Newport, DO [facility] Radiology Electronically Signed   By: Corrie Mckusick D.O.   On: 11/14/2015 08:48   Dg Chest Port 1 View  11/13/2015  CLINICAL DATA:  Shortness of breath. EXAM: PORTABLE CHEST 1 VIEW COMPARISON:  November 12, 2015. FINDINGS: Stable cardiomegaly. Status post coronary bypass graft. Tracheostomy and feeding tube are unchanged. Bilateral subclavian catheters are unchanged. No pneumothorax is noted. Stable bibasilar opacities are noted with left greater than right. Stable left pleural effusion is noted. Bony thorax is unremarkable. IMPRESSION: Stable support apparatus. Stable bibasilar opacities. Stable left pleural effusion. Electronically Signed   By: Marijo Conception, M.D.   On: 11/13/2015 07:37   Dg Chest Port 1 View  11/12/2015  CLINICAL  DATA:  Respiratory failure. EXAM: PORTABLE CHEST 1 VIEW COMPARISON:  11/11/2015. FINDINGS: Tracheostomy tube, feeding tube, left IJ line, right PICC line in stable position . Prior CABG. Cardiomegaly with diffuse bilateral pulmonary infiltrates consistent pulmonary edema. Progressed from prior exam. Small bilateral pleural effusions. No pneumothorax. IMPRESSION: 1. Lines and tubes in stable position. 2. Prior CABG. Cardiomegaly with progressive bilateral pulmonary infiltrates consistent with pulmonary edema. Small bilateral pleural effusions are noted. Electronically Signed   By: Marcello Moores  Register   On: 11/12/2015 07:46   Dg Chest Port 1 View  11/11/2015   CLINICAL DATA:  Tracheostomy placement EXAM: PORTABLE CHEST 1 VIEW COMPARISON:  11/11/2015 FINDINGS: Tracheostomy tube has been placed with the tip projecting over the mid trachea. No pneumothorax. Right PICC line remains in place, unchanged as does left central line. Cardiomegaly with bilateral perihilar and lower lobe opacities, likely edema. Suspect layering effusions. IMPRESSION: Interval placement tracheostomy tube without pneumothorax. Suspected CHF. Bilateral effusions. Findings similar to prior study. Electronically Signed   By: Rolm Baptise M.D.   On: 11/11/2015 14:50   Dg Chest Port 1 View  11/11/2015  CLINICAL DATA:  Respiratory failure. EXAM: PORTABLE CHEST 1 VIEW COMPARISON:  11/10/2015. FINDINGS: Endotracheal tube, feeding tube, bilateral subclavian lines in stable position. Prior CABG. Cardiomegaly with diffuse bilateral pulmonary infiltrates consistent with pulmonary edema. Worsening from prior exam. Small left pleural effusion. No pneumothorax. IMPRESSION: 1.  Lines and tubes in stable position. 2. Prior CABG. Cardiomegaly with worsening bilateral pulmonary edema. Small left pleural effusion. Electronically Signed   By: Marcello Moores  Register   On: 11/11/2015 07:17   Dg Chest Port 1 View  11/10/2015  CLINICAL DATA:  Shortness of breath . EXAM: PORTABLE CHEST 1 VIEW COMPARISON:  11/09/2015. FINDINGS: Endotracheal tube, NG tube, right PICC line, left subclavian line in stable position. Prior CABG. Cardiomegaly. Left lower lobe atelectasis and/or infiltrate with small left pleural effusion. Mild right base atelectasis and or infiltrate. No pneumothorax . IMPRESSION: 1.  Lines and tubes in stable position. 2. Left lower lobe atelectasis and/or infiltrate with small left pleural effusion. Mild right base atelectasis and/or infiltrate. Findings stable from prior exam. 3.  Prior CABG.  Stable cardiomegaly. Electronically Signed   By: Marcello Moores  Register   On: 11/10/2015 07:51   Dg Chest Port 1  View  11/09/2015  CLINICAL DATA:  Atelectasis. EXAM: PORTABLE CHEST 1 VIEW COMPARISON:  11/08/2015 FINDINGS: Endotracheal tube in satisfactory position. Right arm PICC tip in the lower SVC unchanged. Left subclavian central venous catheter in the SVC. NG tube and feeding tube enters the stomach with the tips not visualized. Negative for pneumothorax. Left lower lobe airspace disease unchanged. Small left effusion unchanged. Mild pulmonary vascular congestion unchanged. IMPRESSION: Support lines remain in satisfactory position and unchanged Left lower lobe atelectasis/ infiltrate unchanged. Mild right lower lobe airspace consolidation stable. Mild vascular congestion unchanged. Electronically Signed   By: Franchot Gallo M.D.   On: 11/09/2015 07:36   Dg Chest Port 1 View  11/08/2015  CLINICAL DATA:  Respiratory failure, hypertension, coronary artery disease post MI and CABG EXAM: PORTABLE CHEST 1 VIEW COMPARISON:  Portable exam 1216 hours compared to 11/06/2015 FINDINGS: Tip of endotracheal tube projects 6.8 cm above carina. Nasogastric tube and feeding tube extends into stomach. LEFT subclavian central venous catheter with tip projecting over SVC. RIGHT arm PICC line tip projects over SVC. Enlargement of cardiac silhouette post CABG. Stable mediastinal contours. Increased LEFT lower lobe consolidation. Slightly increased infiltrate at RIGHT base as  well. Minimal central peribronchial thickening. No pleural effusion or pneumothorax. IMPRESSION: Enlargement of cardiac silhouette post CABG. Stable line and tube positions. Increased LEFT lower lobe consolidation with minimally increased infiltrate at RIGHT base as well. Findings are suspicious for pneumonia involving the LEFT lower lobe though a component of coexisting pulmonary edema may be present. Electronically Signed   By: Lavonia Dana M.D.   On: 11/08/2015 12:47   Dg Chest Port 1 View  11/06/2015  CLINICAL DATA:  Acute CHF secondary to acute MI, complete heart  block. EXAM: PORTABLE CHEST 1 VIEW COMPARISON:  Portable chest x-ray of November 05, 2015 FINDINGS: The lungs are adequately inflated. The pulmonary interstitial markings are less prominent on the right today. They have improved on the left as well and the hemidiaphragm is now visible. There is persistent interstitial edema on the left. The cardiac silhouette remains enlarged. The intraaortic balloon pump marker tip projects over the proximal portion of descending thoracic aorta and appears stable. The endotracheal tube tip projects 5.5 cm above the carina. The nasogastric and Doppler AH feeding tubes have their tips below the inferior margin of the image. The right-sided PICC line tip projects over the junction of the middle and distal thirds of the SVC. The left subclavian venous catheter tip projects over the midportion of the SVC. IMPRESSION: Decreasing pulmonary edema consistent with improvement in CHF. Persistent perihilar interstitial edema on the left. The support tubes and devices are in reasonable position. Electronically Signed   By: David  [place] M.D.   On: 11/06/2015 07:32   Dg Chest Port 1 View  11/05/2015  CLINICAL DATA:  Respiratory failure. EXAM: PORTABLE CHEST 1 VIEW COMPARISON:  11/04/2015 . FINDINGS: Endotracheal tube, NG tube, right PICC line stable position. Prior CABG. Cardiomegaly. Persistent left mid and lower lung field infiltrate. Persistent mild atelectatic changes right upper and right lower lobes. No pneumothorax. IMPRESSION: 1.  Lines and tubes in stable position. 2. Persistent left mid and left lower lung infiltrate, no interim change. Persistent right upper lobe and right lower lobe subsegmental atelectasis. No interim change. 3. Prior CABG.  Stable cardiomegaly. Electronically Signed   By: Marcello Moores  Register   On: 11/05/2015 07:48   Dg Chest Port 1 View  11/04/2015  CLINICAL DATA:  Hypoxia EXAM: PORTABLE CHEST 1 VIEW COMPARISON:  November 04, 2015 FINDINGS: Endotracheal tube tip is  5.3 cm above the carina. Central catheter tip is in the superior vena cava near the cavoatrial junction. Nasogastric tube tip and side port are below the diaphragm. There also is a feeding tube with the tip below the diaphragm. No pneumothorax. There has been removal of a left chest tube. There is airspace consolidation throughout the left mid and lower lung zones, stable. There is mild atelectasis in the right upper lobe, stable. No new opacity is evident. There is stable cardiomegaly. The pulmonary vascularity is normal. No adenopathy is evident. IMPRESSION: Tube and catheter positions as described without pneumothorax. Airspace consolidation throughout the left mid and lower lung zones, stable. Atelectasis right upper lobe, stable. No new opacity. Stable cardiomegaly. Electronically Signed   By: Lowella Grip III M.D.   On: 11/04/2015 14:52   Dg Chest Port 1 View  11/04/2015  CLINICAL DATA:  Status post CABG 10/28/2015. EXAM: PORTABLE CHEST 1 VIEW COMPARISON:  Single view of the chest 11/03/2015 and 11/02/2015. FINDINGS: Support tubes and lines including a left chest tube are unchanged since the most recent exam. The chest is better expanded today with decreased  atelectasis. Airspace opacity in the left mid and lower lung zones persists. There is likely a left pleural effusion. Marked cardiomegaly is seen with only mild vascular congestion noted. IMPRESSION: Increased pulmonary expansion with decreased scattered atelectasis. Left mid and lower lung zone airspace disease could be due to atelectasis or pneumonia. Small left pleural effusion noted. Support tubes and lines projecting good position.  No pneumothorax. Electronically Signed   By: Inge Rise M.D.   On: 11/04/2015 07:30   Dg Chest Port 1 View  11/03/2015  CLINICAL DATA:  Central line placement. EXAM: PORTABLE CHEST 1 VIEW COMPARISON:  11/03/2015. FINDINGS: Left subclavian central line noted with tip at the cavoatrial junction. Interval  removal of right chest tube. Endotracheal tube, NG tube, right PICC line, left IJ sheath left chest tube in stable position. No pneumothorax. Prior CABG. Stable cardiomegaly. Low lung volumes. A mild component of congestive heart failure cannot be excluded. Similar findings noted on prior exam P IMPRESSION: 1. Interim placement of left subclavian central line, its tip is at the cavoatrial junction. Interim removal right chest tube . No pneumothorax. 2. Remaining lines and tubes including left chest tube in stable position. 3. Prior CABG. Stable cardiomegaly. Low lung volumes. A a mild component congestive heart failure cannot be excluded . Similar findings noted on prior exam. Electronically Signed   By: [place]   On: 11/03/2015 09:58   Dg Chest Port 1 View  11/03/2015  CLINICAL DATA:  Status post CABG on October 27, 2015 persistent left lower lobe atelectasis or pneumonia. No pneumothorax or significant pleural effusion. EXAM: PORTABLE CHEST 1 VIEW COMPARISON:  Portable chest x-ray of November 02, 2015 at 5:48 a.m. FINDINGS: There has been interval placement of an intra aortic balloon pump whose marker lies over the junction of the aortic arch with the proximal descending thoracic aorta. The cardiac silhouette remains enlarged. The pulmonary vascularity remains mildly prominent. The retrocardiac region on the left remains dense. Bilateral chest tubes are in stable position. There is no pneumothorax nor significant pleural effusion. The endotracheal tube tip lies approximately 6.5 cm above the carina. The esophagogastric tube tip projects below the inferior margin of the image. The right-sided PICC line tip projects over the midportion of the SVC. A previously demonstrated pericardial drain is not clearly evident on today's study. IMPRESSION: Mild CHF. Persistent left lower lobe atelectasis or pneumonia. Interval placement of an intra aortic balloon pump which appears to be in appropriate position  radiographically. The other support tubes and lines are in reasonable position. Electronically Signed   By: David  Martinique M.D.   On: 11/03/2015 07:27   Dg Chest Port 1 View  11/02/2015  CLINICAL DATA:  CABG. EXAM: PORTABLE CHEST 1 VIEW COMPARISON:  11/01/2015. FINDINGS: Endotracheal tube, NG tube, right PICC line, bilateral chest tubes in stable position. Prior CABG. Stable cardiomegaly . Persistent mild interstitial prominence, congestive heart failure cannot be excluded. No interim change. Low lung volumes with basilar atelectasis . IMPRESSION: 1. Endotracheal tube, NG tube, right PICC line, bilateral chest tubes in stable position . No pneumothorax. 2. Prior CABG. Cardiomegaly with pulmonary interstitial prominence and small left pleural effusion consistent with mild congestive heart failure. No interim change from prior exam . Electronically Signed   By: Marcello Moores  Register   On: 11/02/2015 07:20   Dg Chest Port 1 View  11/01/2015  CLINICAL DATA:  Post CABG EXAM: PORTABLE CHEST 1 VIEW COMPARISON:  10/31/2015 FINDINGS: Cardiomediastinal silhouette is stable. Status post CABG. Stable  endotracheal and NG tube position. Left chest tube is unchanged in position. Persistent central vascular congestion and mild interstitial prominence bilaterally suspicious for the mild interstitial edema. Left IJ sheath in place. Probable small left pleural effusion left basilar atelectasis or infiltrate. Right arm PICC line is unchanged in position. There is no pneumothorax. IMPRESSION: Status post CABG. Stable support apparatus. Stable left chest tube position. No pneumothorax. Again noted bilateral mild interstitial prominence and perihilar opacities suspicious for mild pulmonary edema. Probable small left pleural effusion left basilar atelectasis or infiltrate. Electronically Signed   By: Lahoma Crocker M.D.   On: 11/01/2015 09:57   Dg Chest Port 1 View  10/31/2015  CLINICAL DATA:  55 year old male with a history of endotracheal  tube placement Patient has undergone coronary artery bypass grafting x4 10/27/2015. Placement of intra aortic balloon pump at this date. Ischemic cardiomyopathy. EXAM: PORTABLE CHEST 1 VIEW COMPARISON:  10/30/2015. FINDINGS: Endotracheal tube terminates approximately 3.4 cm above the carina, unchanged. Gastric tube terminates out of the field of view. Defibrillator pads project over the left and right chest. Bilateral thoracostomy tubes. Right upper extremity PICC. The mediastinal drain not visualized. Intra-aortic balloon pump not visualized. Surgical changes of median sternotomy and CABG. Mixed bilateral interstitial and airspace opacities. Low lung volumes. IMPRESSION: Low lung volumes with mixed bilateral interstitial and airspace opacities, similar to the comparison chest x-ray. Retrocardiac opacity may reflect persistent pleural fluid, and/or atelectasis. Surgical support apparatus appear unchanged, as above. Intra aortic balloon pump not visualized. Signed, Dulcy Fanny. Earleen Newport, DO [facility] Radiology Electronically Signed   By: Corrie Mckusick D.O.   On: 10/31/2015 07:25   Dg Chest Port 1 View  10/30/2015  CLINICAL DATA:  Post cavity.  Recent CABG. EXAM: PORTABLE CHEST 1 VIEW COMPARISON:  Portable film earlier today. FINDINGS: Overlying support apparatus redemonstrated. Tubes and lines remain stable. Elevated LEFT hemidiaphragm. No definite pneumothorax. Moderate vascular congestion may be increased. Cardiomegaly. IMPRESSION: Slight worsening aeration. Moderate vascular congestion may be increased. Electronically Signed   By: Staci Righter M.D.   On: 10/30/2015 18:39   Dg Chest Port 1 View  10/30/2015  CLINICAL DATA:  Status post coronary bypass grafting EXAM: PORTABLE CHEST 1 VIEW COMPARISON:  10/30/2015 FINDINGS: Cardiac shadow remains enlarged. Bilateral chest tubes are again seen. A right-sided PICC line and endotracheal tube are noted in satisfactory  position. Left jugular sheath is noted. Elevation of left hemidiaphragm is noted with left basilar atelectasis. No pneumothorax is noted. IMPRESSION: Postsurgical change with tubes and lines as described. Mild left basilar atelectasis. Electronically Signed   By: Inez Catalina M.D.   On: 10/30/2015 07:45   Dg Chest Port 1 View  10/30/2015  CLINICAL DATA:  A chest tube in place EXAM: PORTABLE CHEST 1 VIEW COMPARISON:  10/30/2015 FINDINGS: Postoperative changes in the mediastinum. Endotracheal tube with tip measuring 6.3 cm about the carina. Right PICC catheter with tip over the cavoatrial junction. Left central venous catheter sheath with tip over the left neck consistent location in the internal jugular vein. Bilateral chest tubes. Infiltration or atelectasis in the right lung base. Cardiac enlargement. No significant vascular congestion. No pneumothorax. IMPRESSION: Appliance positioned as described. Cardiac enlargement with infiltration or atelectasis in the right lung base. Bilateral chest tubes are present but no visualized pneumothorax. Electronically Signed   By: Lucienne Capers M.D.   On: 10/30/2015 03:07   Dg Chest Port 1 View  10/30/2015  CLINICAL DATA:  Shortness of breath.  Code blue. EXAM: PORTABLE CHEST 1 VIEW COMPARISON:  10/29/2015 FINDINGS: Postoperative changes in the mediastinum. Bilateral chest tubes are present. Right PICC line with tip over the cavoatrial junction. Left central venous catheter or sheath with tip over the left side of the neck, likely in the left internal jugular vein. Cardiac enlargement without significant vascular congestion. Shallow inspiration with elevation of the left hemidiaphragm. Probable left pleural effusion. No definite pulmonary consolidation. IMPRESSION: Shallow inspiration. Probable left pleural effusion. Cardiac enlargement. Appliances appear in satisfactory position. Electronically Signed   By: Lucienne Capers M.D.   On: 10/30/2015 01:18   Dg Chest Port  1 View  10/29/2015  CLINICAL DATA:  Postop CABG 2 days ago. EXAM: PORTABLE CHEST 1 VIEW COMPARISON:  Portable chest x-ray of October 28, 2015 FINDINGS: The trachea and esophagus have been extubated. The cardiac silhouette remains enlarged. The pulmonary vascularity is mildly engorged. The bilateral chest tubes and the mediastinal drain are in stable position. There is no pneumothorax or large pleural effusion. The Swan-Ganz catheter tip overlies a proximal right lower lobe pulmonary artery branch. The mediastinum is widened and accentuated by the hypo inflation. IMPRESSION: Bilateral hypoinflation following extubation of the trachea there is crowding of the pulmonary vascularity. Left basilar atelectasis or less likely pneumonia is more prominent today. Probable small left pleural effusion. The remaining support tubes and lines are in stable position. Electronically Signed   By: David  Martinique M.D.   On: 10/29/2015 07:40   Dg Chest Port 1 View  10/28/2015  CLINICAL DATA:  Post CABG, on ventilator, followup portable chest x-ray of 10/28/2015 EXAM: PORTABLE CHEST 1 VIEW COMPARISON:  None. FINDINGS: The tip of the endotracheal tube is approximately 5.2 cm above the carina. Aeration has improved slightly. Atelectasis remains at the lung bases left-greater-than-right. Swan-Ganz catheter tip is in the right lower lobe pulmonary artery and bilateral chest tubes remain. No pneumothorax is seen. Heart size is stable. IMPRESSION: 1. Improved aeration. 2. Bilateral chest tubes.  No pneumothorax. 3. Tip of endotracheal tube approximately 5.2 cm above the carina. Electronically Signed   By: Ivar Drape M.D.   On: 10/28/2015 08:01   Dg Chest Port 1 View  10/27/2015  CLINICAL DATA:  Status post CABG EXAM: PORTABLE CHEST 1 VIEW COMPARISON:  10/27/2015 FINDINGS: Unchanged tracheostomy tube. Swan-Ganz central venous line is now seen with tip advanced into the right perihilar region. Stable right chest tube with and left chest  tube. NG tube again crosses the gastroesophageal junction. Stable mediastinal drain. Enlarged cardiac silhouette. Limited inspiratory effect with bibasilar opacities likely representing atelectasis. IMPRESSION: Anticipated postoperative appearance with bibasilar atelectasis. Electronically Signed   By: Skipper Cliche M.D.   On: 10/27/2015 19:25   Dg Chest Portable 1 View  10/27/2015  CLINICAL DATA:  Status post coronary bypass grafting EXAM: PORTABLE CHEST 1 VIEW COMPARISON:  10/23/2015 FINDINGS: Cardiac shadow is mildly enlarged. A Swan-Ganz catheter is noted in the right pulmonary outflow tract. Bilateral thoracostomy catheters are seen. No pneumothorax is noted. An endotracheal tube is noted in satisfactory position. Intra-aortic balloon pump is noted over the aortic arch. Mild left basilar atelectasis is seen. Mild central vascular congestion is noted. IMPRESSION: Postoperative changes with tubes and lines as described above. Mild vascular congestion and left basilar atelectasis. Electronically Signed   By: Inez Catalina M.D.   On: 10/27/2015 17:23   Dg Abd Portable 1v  11/19/2015  CLINICAL DATA:  Encounter for enteric feeding tube placement. EXAM: PORTABLE ABDOMEN - 1 VIEW  COMPARISON:  11/18/2015 FINDINGS: Enteric feeding tube tip now projects in the fourth portion of the duodenum, near the ligament of Treitz. There is small bowel dilation which appears increased from the prior radiographs. IMPRESSION: Enteric feeding tube metallic tip now projects in the fourth portion of the duodenum near the ligament of Treitz. Increased small bowel dilation which may reflect an adynamic ileus or partial obstruction this is only partly imaged on this exam. Electronically Signed   By: Lajean Manes M.D.   On: 11/19/2015 10:24   Dg Abd Portable 1v  11/18/2015  CLINICAL DATA:  55 year old male feeding tube placement. Initial encounter. EXAM: PORTABLE ABDOMEN - 1 VIEW COMPARISON:  11/16/2015. FINDINGS: Portable AP  supine view at 1109 hours. A enteric tube placed, tip at the level of the distal second portion of the duodenum. Epicardial pacer wires remain in place. Increased gaseous distension of large bowel in the abdomen, but no dilated small bowel loops. Mild gaseous distension of the stomach. No definite pneumoperitoneum on this supine view. Stable visualized osseous structures. IMPRESSION: 1. Enteric tube placed, it tip at the mid duodenum. 2. Increased large bowel gas, favor ileus over mechanical obstruction. Electronically Signed   By: Genevie Ann M.D.   On: 11/18/2015 12:01   Dg Abd Portable 1v  11/16/2015  CLINICAL DATA:  Ileus, acute MI with CHF EXAM: PORTABLE ABDOMEN - 1 VIEW COMPARISON:  Portable abdominal films of November 15, 2015 FINDINGS: There remain small amounts of gas within small bowel loops as well as and large bowel loops. No abnormally distended bowel loops are observed. There is gas in the stomach. A feeding tube is in place whose tip appears to lie at the duodenal -jejunal junction. IMPRESSION: Fairly stable appearance of the abdomen with moderate amounts of gas within small and large bowel loops consistent with ileus. No free extraluminal gas collections are observed. Electronically Signed   By: David  Martinique M.D.   On: 11/16/2015 07:20   Dg Abd Portable 1v  11/15/2015  CLINICAL DATA:  Chronic ventilator dependent respiratory failure. Acute onset of nausea and vomiting. EXAM: PORTABLE ABDOMEN - 1 VIEW COMPARISON:  11/03/2015, 11/02/2015. FINDINGS: Feeding tube tip projects at the expected location of the 4th portion of the duodenum at or near the ligament Treitz. Nonobstructive bowel gas pattern. Gas in normal caliber loops of small bowel throughout the abdomen and pelvis. Gas throughout normal caliber colon from cecum to rectum. No suggestion of free intraperitoneal air. IMPRESSION: 1. Bowel gas pattern indicating a mild generalized ileus. No evidence of bowel obstruction. 2. Feeding tube tip  appropriately positioned in the 4th portion of the duodenum at or near the ligament of Treitz. Electronically Signed   By: Evangeline Dakin M.D.   On: 11/15/2015 07:26   Dg Abd Portable 1v  11/02/2015  CLINICAL DATA:  Assess feeding tube positioning EXAM: PORTABLE ABDOMEN - 1 VIEW COMPARISON:  None in PACs FINDINGS: The repeated KUB with has less motion artifact. The tip of the feeding tube and nasogastric tube lie in the region of the distal gastric body in the pre-pyloric region. The bowel gas pattern is within the limits of normal. Numerous tubes and lines and electrodes overlie the abdomen. IMPRESSION: The tips of the feeding tube and the esophagogastric tube lie in the region of the distal gastric body in the pre-pyloric region. Electronically Signed   By: David  Martinique M.D.   On: 11/02/2015 12:16   Dg Loyce Dys Tube Plc W/fl-no Rad  11/03/2015  CLINICAL DATA:  NASO G TUBE PLACEMENT WITH FLUORO Fluoroscopy was utilized by the requesting physician.  No radiographic interpretation.   Dg Swallowing Func-speech Pathology  11/21/2015  Objective Swallowing Evaluation: Type of Study: MBS-Modified Barium Swallow Study Patient Details Name: Christopher [name] MRN: [id number] Date of Birth: 03/26/1961 Today's Date: 11/21/2015 Time: SLP Start Time (ACUTE ONLY): 1129-SLP Stop Time (ACUTE ONLY): 1153 SLP Time Calculation (min) (ACUTE ONLY): 24 min Past Medical History: Past Medical History Diagnosis Date . Hypertension  . TIA (transient ischemic attack)  . Stenosis of right carotid artery  . Complete heart block ([place]) 10/20/2015 Past Surgical History: Past Surgical History Procedure Laterality Date . Arm surgery Right 2003 . Eye surgery   . Laceration repair   . Cardiac catheterization N/A 10/20/2015   Procedure: Left Heart Cath and Coronary Angiography;  Surgeon: Sherren Mocha, MD;  Location: [place] [facility];  Service: Cardiovascular;  Laterality: N/A; . Coronary artery bypass graft N/A 10/27/2015   Procedure: CORONARY  ARTERY BYPASS GRAFTING (CABG) x four, using left internal mammary artery and rt leg greater saphenous vein harvested endoscopically;  Surgeon: Ivin Poot, MD;  Location: [place];  Service: Open Heart Surgery;  Laterality: N/A; . Tee without cardioversion N/A 10/27/2015   Procedure: TRANSESOPHAGEAL ECHOCARDIOGRAM (TEE);  Surgeon: Ivin Poot, MD;  Location: [place];  Service: Open Heart Surgery;  Laterality: N/A; . Endarterectomy Right 10/27/2015   Procedure: RIGHT ENDARTERECTOMY CAROTID with patch angioplasty using Xenosure bovine pericardium patch;  Surgeon: Conrad Brookfield, MD;  Location: [place];  Service: Vascular;  Laterality: Right; . Cardiac catheterization N/A 11/02/2015   Procedure: Right/Left Heart Cath and Coronary/Graft Angiography;  Surgeon: Larey Dresser, MD;  Location: [place] [facility];  Service: Cardiovascular;  Laterality: N/A; HPI: 55 y.o. male w/ ischemic cardiomyopathy following MI s/p CABG & CEA. Post-operative course complicated by arrhythmia and biventricular failure, HCAP, heart block. PMHx HTN, stenosis of R carotid artery, TIA. Intubated 3/21-3/22, re-intubated 3/26 and failed to wean, trach placed 4/6. Subjective: pt alert, anxious for PO Assessment / Plan / Recommendation CHL IP CLINICAL IMPRESSIONS 11/21/2015 Therapy Diagnosis Moderate pharyngeal phase dysphagia;Severe pharyngeal phase dysphagia Clinical Impression Pt reassessed with use of PMSV during MBS. Pt with improved subglottic pressure for swallow function with use of the PMSV however continues to exhibit moderate to severe sensory motor pharygneal dysphagia . Pt with delay in swallow initiation across all consistencies. Isolated puree trials yielded moderate pharyngeal residuals secondary to reduced tongue base retraction and decreased laryngeal elevation.  Deep penetration of honey thick liquids evidenced after the swallow which was impacted by pharygneal residuals from previous puree trials in the valleculae and pyriform  sinuses.  This was intermittently sensed when in greater amounts. Pharyngeal residual amount decreased with nectar thick liquids, however airway protection compromised with aspiration before the swallow despite use of chin tuck. Recommend continue NPO execpt for ice chips with further PO trials and extended use of PMSV for improved pharygneal sensation. ST to continue to follow up.   Impact on safety and function Severe aspiration risk   CHL IP TREATMENT RECOMMENDATION 11/21/2015 Treatment Recommendations Therapy as outlined in treatment plan below   Prognosis 11/21/2015 Prognosis for Safe Diet Advancement Good Barriers to Reach Goals Severity of deficits Barriers/Prognosis Comment -- CHL IP DIET RECOMMENDATION 11/21/2015 SLP Diet Recommendations NPO Liquid Administration via -- Medication Administration Via alternative means Compensations -- Postural Changes --   CHL IP OTHER RECOMMENDATIONS 11/21/2015 Recommended Consults -- Oral Care Recommendations  Oral care QID Other Recommendations (No Data)   CHL IP FOLLOW UP RECOMMENDATIONS 11/21/2015 Follow up Recommendations Inpatient Rehab   CHL IP FREQUENCY AND DURATION 11/21/2015 Speech Therapy Frequency (ACUTE ONLY) min 2x/week Treatment Duration 2 weeks      CHL IP ORAL PHASE 11/21/2015 Oral Phase WFL Oral - Pudding Teaspoon -- Oral - Pudding Cup -- Oral - Honey Teaspoon -- Oral - Honey Cup -- Oral - Nectar Teaspoon -- Oral - Nectar Cup -- Oral - Nectar Straw -- Oral - Thin Teaspoon -- Oral - Thin Cup -- Oral - Thin Straw -- Oral - Puree -- Oral - Mech Soft -- Oral - Regular -- Oral - Multi-Consistency -- Oral - Pill -- Oral Phase - Comment --  CHL IP PHARYNGEAL PHASE 11/21/2015 Pharyngeal Phase Impaired Pharyngeal- Pudding Teaspoon -- Pharyngeal -- Pharyngeal- Pudding Cup -- Pharyngeal -- Pharyngeal- Honey Teaspoon Reduced tongue base retraction;Reduced laryngeal elevation;Reduced anterior laryngeal mobility;Reduced epiglottic inversion;Delayed swallow initiation-pyriform  sinuses;Penetration/Apiration after swallow;Pharyngeal residue - valleculae;Pharyngeal residue - pyriform;Pharyngeal residue - posterior pharnyx;Penetration/Aspiration during swallow Pharyngeal -- Pharyngeal- Honey Cup -- Pharyngeal -- Pharyngeal- Nectar Teaspoon Moderate aspiration;Penetration/Aspiration before swallow;Reduced tongue base retraction;Reduced airway/laryngeal closure;Reduced laryngeal elevation;Reduced epiglottic inversion;Reduced anterior laryngeal mobility;Pharyngeal residue - valleculae;Pharyngeal residue - pyriform;Pharyngeal residue - posterior pharnyx Pharyngeal -- Pharyngeal- Nectar Cup -- Pharyngeal -- Pharyngeal- Nectar Straw -- Pharyngeal -- Pharyngeal- Thin Teaspoon -- Pharyngeal -- Pharyngeal- Thin Cup NT Pharyngeal -- Pharyngeal- Thin Straw -- Pharyngeal -- Pharyngeal- Puree Delayed swallow initiation-vallecula;Pharyngeal residue - valleculae;Pharyngeal residue - pyriform;Pharyngeal residue - posterior pharnyx Pharyngeal -- Pharyngeal- Mechanical Soft -- Pharyngeal -- Pharyngeal- Regular -- Pharyngeal -- Pharyngeal- Multi-consistency -- Pharyngeal -- Pharyngeal- Pill -- Pharyngeal -- Pharyngeal Comment --  CHL IP CERVICAL ESOPHAGEAL PHASE 11/21/2015 Cervical Esophageal Phase WFL Pudding Teaspoon -- Pudding Cup -- Honey Teaspoon -- Honey Cup -- Nectar Teaspoon -- Nectar Cup -- Nectar Straw -- Thin Teaspoon -- Thin Cup -- Thin Straw -- Puree -- Mechanical Soft -- Regular -- Multi-consistency -- Pill -- Cervical Esophageal Comment -- No flowsheet data found. Arvil Chaco MA, [place] Speech Language Pathologist  Levi Aland 11/21/2015, 12:49 PM               PHYSICAL EXAM CVP 12 General: Sitting up in chair. Using PM valvle Neck: JVP hard to see.  no thyromegaly or thyroid nodule.  S/p Trach  Lungs:clear CV: Nondisplaced PMI.  Heart regular S1/S2, no S3/S4, no murmur. 1-2+ edema. UNNA boots Abdomen: obese Nontender. Good BS.   Neurologic: alert and oriented x  3 Extremities: No clubbing or cyanosis.    TELEMETRY: NSR 60s  ASSESSMENT AND PLAN:  1. CAD: S/p late presentation inferior MI and CABG x 4. Continue statin, ASA. Coronary angiography 3/27 with multiple VT episodes showed patent LIMA-LAD and SVG-RCA.  Patent SVG-ramus and SVG-D but slow flow in both grafts with diffusely diseased small target vessels.  2. Cardiogenic shock: Acute systolic CHF with RV failure post inferior MI with suspected RV involvement. EF 25-30% on echo (difficult images on 3/24 echo). He has significant RV failure from suspected RV infarct.  3/27 IABP placed to try to allow decrease in milrinone with frequent VT.  3/31 echo with EF 30-35%.  IABP out 4/2.  Milrinone off 4/7 - Doing well off milrinone - CVP remains up likely due to RV failure. But improving on torsemide.   - Continue UNA boots. - No spiro yet with CKD. Can consider careful b-blocker initiation soon  - Continue sildenafil  40 tid.   - Continue hydralazine 50 mg tid for afterload reduction (no Imdur with sildenafil use).  3. Complete heart block: Resolved.  4. VF arrest 3/24 early am and again at night 3/24.  Ectopy quiescent. - Continue po amio 5. AKI: Creatinine improving. Now 1.6 6. ID: Possible LLL PNA.  He completed course of  vancomycin/levofloxacin.  7. Pulmonary: S/P Trach 4/5 on trach collar. Has PM valve. Failed swallow study again 4/15. Will repeat early this week, 8. Hypernatremia: Improving on D5W. Trying to balance with fluid overload.  Continue torsemide. CVP coming down.  9. 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 He was off AC due to trach bleeding. On DVT prophylaxis. Will not start coumadin unless AF/AFL recurs. 10. Deconditioning - continue PT. Can pull central lines. Has PICC. CIR?  Glori Bickers MD  11/22/2015 8:33 AM

## 2015-11-22 NOTE — Progress Notes (Signed)
Patient ID: Christopher [name], male   DOB: 27-Mar-1961, 55 y.o.   MRN: [id number] Patient ID: Christopher [name], male   DOB: April 26, 1961, 55 y.o.   MRN: [id number] TCTS DAILY ICU PROGRESS NOTE                   [address]            [place] [address]          [phone]   20 Days Post-Op Procedure(s) (LRB): Right/Left Heart Cath and Coronary/Graft Angiography (N/A) Intra-Aortic Balloon Pump Insertion  Total Length of Stay:  LOS: 33 days   Subjective: Up in chair , talking without difficulity  Objective: Vital signs in last 24 hours: Temp:  [97.6 F (36.4 C)-98.9 F (37.2 C)] 97.6 F (36.4 C) (04/16 0700) Pulse Rate:  [75-88] 81 (04/16 0600) Cardiac Rhythm:  [-] Normal sinus rhythm (04/16 0400) Resp:  [14-22] 19 (04/16 0600) BP: (92-134)/(63-88) 116/78 mmHg (04/16 0600) SpO2:  [88 %-100 %] 98 % (04/16 0600) FiO2 (%):  [40 %] 40 % (04/16 0804) Weight:  [232 lb 12.9 oz (105.6 kg)] 232 lb 12.9 oz (105.6 kg) (04/16 0455)  Filed Weights   11/20/15 0500 11/21/15 0500 11/22/15 0455  Weight: 235 lb 7.2 oz (106.8 kg) 235 lb 3.7 oz (106.7 kg) 232 lb 12.9 oz (105.6 kg)    Weight change: -2 lb 6.8 oz (-1.1 kg)   Hemodynamic parameters for last 24 hours: CVP:  [3 mmHg-17 mmHg] 12 mmHg  Intake/Output from previous day: 04/15 0701 - 04/16 0700 In: 2700 [I.V.:600; NG/GT:2100] Out: 3570 [Urine:3370; Stool:200]  Intake/Output this shift:    Current Meds: Scheduled Meds: . amiodarone  200 mg Oral BID  . antiseptic oral rinse  7 mL Mouth Rinse QID  . aspirin  81 mg Oral Daily  . atorvastatin  80 mg Oral q1800  . chlorhexidine gluconate (SAGE KIT)  15 mL Mouth Rinse BID  . enoxaparin (LOVENOX) injection  40 mg Subcutaneous Q24H  . feeding supplement (PRO-STAT SUGAR FREE 64)  30 mL Per Tube TID  . free water  200 mL Per Tube Q4H  . hydrALAZINE  25 mg Oral 3 times per day  . insulin aspart  0-24 Units Subcutaneous 6 times per day  . insulin detemir  12 Units  Subcutaneous BID  . levalbuterol  1.25 mg Nebulization TID  . pantoprazole sodium  40 mg Per Tube Daily  . potassium chloride  40 mEq Oral 3 times per day  . QUEtiapine  50 mg Oral QHS  . sildenafil  40 mg Oral TID  . silver sulfADIAZINE   Topical BID  . sodium chloride flush  10-40 mL Intracatheter Q12H  . torsemide  60 mg Oral Daily  . triamcinolone cream   Topical BID   Continuous Infusions: . sodium chloride Stopped (11/15/15 0600)  . dextrose 50 mL/hr at 11/22/15 0600  . feeding supplement (VITAL HIGH PROTEIN) 1,000 mL (11/22/15 0600)   PRN Meds:.diphenhydrAMINE, fentaNYL (SUBLIMAZE) injection, levalbuterol, midazolam, ondansetron (ZOFRAN) IV, sodium chloride flush, traMADol    Lab Results: CBC:  Recent Labs  11/22/15 0415 11/22/15 0534  WBC 18.6* 9.8  HGB 11.4* 10.9*  HCT 34.9* 35.1*  PLT 353 247   BMET:   Recent Labs  11/22/15 0415 11/22/15 0534  NA 135 145  K 3.4* 4.8  CL 96* 110  CO2 31 26  GLUCOSE 118* 115*  BUN 13 51*  CREATININE 0.56* 1.64*  CALCIUM 7.7* 8.7*    PT/INR: No results for input(s): LABPROT, INR in the last 72 hours. Radiology: Dg Swallowing Func-speech Pathology  11/21/2015  Objective Swallowing Evaluation: Type of Study: MBS-Modified Barium Swallow Study Patient Details Name: Christopher [name] MRN: [id number] Date of Birth: 09/08/1960 Today's Date: 11/21/2015 Time: SLP Start Time (ACUTE ONLY): 1129-SLP Stop Time (ACUTE ONLY): 1153 SLP Time Calculation (min) (ACUTE ONLY): 24 min Past Medical History: Past Medical History Diagnosis Date . Hypertension  . TIA (transient ischemic attack)  . Stenosis of right carotid artery  . Complete heart block (HCC) 10/20/2015 Past Surgical History: Past Surgical History Procedure Laterality Date . Arm surgery Right 2003 . Eye surgery   . Laceration repair   . Cardiac catheterization N/A 10/20/2015   Procedure: Left Heart Cath and Coronary Angiography;  Surgeon: Michael Cooper, MD;  Location: [facility] INVASIVE [facility];   Service: Cardiovascular;  Laterality: N/A; . Coronary artery bypass graft N/A 10/27/2015   Procedure: CORONARY ARTERY BYPASS GRAFTING (CABG) x four, using left internal mammary artery and rt leg greater saphenous vein harvested endoscopically;  Surgeon: Peter Van Trigt, MD;  Location: [facility] OR;  Service: Open Heart Surgery;  Laterality: N/A; . Tee without cardioversion N/A 10/27/2015   Procedure: TRANSESOPHAGEAL ECHOCARDIOGRAM (TEE);  Surgeon: Peter Van Trigt, MD;  Location: [facility] OR;  Service: Open Heart Surgery;  Laterality: N/A; . Endarterectomy Right 10/27/2015   Procedure: RIGHT ENDARTERECTOMY CAROTID with patch angioplasty using Xenosure bovine pericardium patch;  Surgeon: Brian L Chen, MD;  Location: [facility] OR;  Service: Vascular;  Laterality: Right; . Cardiac catheterization N/A 11/02/2015   Procedure: Right/Left Heart Cath and Coronary/Graft Angiography;  Surgeon: Dalton S McLean, MD;  Location: [facility] INVAS[facility];  Service: Cardiovascular;  Laterality: N/A; HPI: 55 y.o. male w/ ischemic cardiomyopathy following MI s/p CABG & CEA. Post-operative course complicated by arrhythmia and biventricular failure, HCAP, heart block. PMHx HTN, stenosis of R carotid artery, TIA. Intubated 3/21-3/22, re-intubated 3/26 and failed to wean, trach placed 4/6. Subjective: pt alert, anxious for PO Assessment / Plan / Recommendation CHL IP CLINICAL IMPRESSIONS 11/21/2015 Therapy Diagnosis Moderate pharyngeal phase dysphagia;Severe pharyngeal phase dysphagia Clinical Impression Pt reassessed with use of PMSV during MBS. Pt with improved subglottic pressure for swallow function with use of the PMSV however continues to exhibit moderate to severe sensory motor pharygneal dysphagia . Pt with delay in swallow initiation across all consistencies. Isolated puree trials yielded moderate pharyngeal residuals secondary to reduced tongue base retraction and decreased laryngeal elevation.  Deep penetration of honey thick liquids evidenced after the  swallow which was impacted by pharygneal residuals from previous puree trials in the valleculae and pyriform sinuses.  This was intermittently sensed when in greater amounts. Pharyngeal residual amount decreased with nectar thick liquids, however airway protection compromised with aspiration before the swallow despite use of chin tuck. Recommend continue NPO execpt for ice chips with further PO trials and extended use of PMSV for improved pharygneal sensation. ST to continue to follow up.   Impact on safety and function Severe aspiration risk   CHL IP TREATMENT RECOMMENDATION 11/21/2015 Treatment Recommendations Therapy as outlined in treatment plan below   Prognosis 11/21/2015 Prognosis for Safe Diet Advancement Good Barriers to Reach Goals Severity of deficits Barriers/Prognosis Comment -- CHL IP DIET RECOMMENDATION 11/21/2015 SLP Diet Recommendations NPO Liquid Administration via -- Medication Administration Via alternative means Compensations -- Postural Changes --   CHL IP OTHER RECOMMENDATIONS 11/21/2015 Recommended Consults -- Oral Care Recommendations Oral care QID   Other Recommendations (No Data)   CHL IP FOLLOW UP RECOMMENDATIONS 11/21/2015 Follow up Recommendations Inpatient Rehab   CHL IP FREQUENCY AND DURATION 11/21/2015 Speech Therapy Frequency (ACUTE ONLY) min 2x/week Treatment Duration 2 weeks      CHL IP ORAL PHASE 11/21/2015 Oral Phase WFL Oral - Pudding Teaspoon -- Oral - Pudding Cup -- Oral - Honey Teaspoon -- Oral - Honey Cup -- Oral - Nectar Teaspoon -- Oral - Nectar Cup -- Oral - Nectar Straw -- Oral - Thin Teaspoon -- Oral - Thin Cup -- Oral - Thin Straw -- Oral - Puree -- Oral - Mech Soft -- Oral - Regular -- Oral - Multi-Consistency -- Oral - Pill -- Oral Phase - Comment --  CHL IP PHARYNGEAL PHASE 11/21/2015 Pharyngeal Phase Impaired Pharyngeal- Pudding Teaspoon -- Pharyngeal -- Pharyngeal- Pudding Cup -- Pharyngeal -- Pharyngeal- Honey Teaspoon Reduced tongue base retraction;Reduced laryngeal  elevation;Reduced anterior laryngeal mobility;Reduced epiglottic inversion;Delayed swallow initiation-pyriform sinuses;Penetration/Apiration after swallow;Pharyngeal residue - valleculae;Pharyngeal residue - pyriform;Pharyngeal residue - posterior pharnyx;Penetration/Aspiration during swallow Pharyngeal -- Pharyngeal- Honey Cup -- Pharyngeal -- Pharyngeal- Nectar Teaspoon Moderate aspiration;Penetration/Aspiration before swallow;Reduced tongue base retraction;Reduced airway/laryngeal closure;Reduced laryngeal elevation;Reduced epiglottic inversion;Reduced anterior laryngeal mobility;Pharyngeal residue - valleculae;Pharyngeal residue - pyriform;Pharyngeal residue - posterior pharnyx Pharyngeal -- Pharyngeal- Nectar Cup -- Pharyngeal -- Pharyngeal- Nectar Straw -- Pharyngeal -- Pharyngeal- Thin Teaspoon -- Pharyngeal -- Pharyngeal- Thin Cup NT Pharyngeal -- Pharyngeal- Thin Straw -- Pharyngeal -- Pharyngeal- Puree Delayed swallow initiation-vallecula;Pharyngeal residue - valleculae;Pharyngeal residue - pyriform;Pharyngeal residue - posterior pharnyx Pharyngeal -- Pharyngeal- Mechanical Soft -- Pharyngeal -- Pharyngeal- Regular -- Pharyngeal -- Pharyngeal- Multi-consistency -- Pharyngeal -- Pharyngeal- Pill -- Pharyngeal -- Pharyngeal Comment --  CHL IP CERVICAL ESOPHAGEAL PHASE 11/21/2015 Cervical Esophageal Phase WFL Pudding Teaspoon -- Pudding Cup -- Honey Teaspoon -- Honey Cup -- Nectar Teaspoon -- Nectar Cup -- Nectar Straw -- Thin Teaspoon -- Thin Cup -- Thin Straw -- Puree -- Mechanical Soft -- Regular -- Multi-consistency -- Pill -- Cervical Esophageal Comment -- No flowsheet data found. Chelsea Sumney MA, CCC-SLP Acute Care Speech Language Pathologist  Sumney, Chelsea E 11/21/2015, 12:49 PM                Assessment/Plan: S/P Procedure(s) (LRB): Right/Left Heart Cath and Coronary/Graft Angiography (N/A) Intra-Aortic Balloon Pump Insertion Slowly improving, patient still on ice chips only  Needs  placement     B  11/22/2015 9:54 AM   

## 2015-11-22 NOTE — Progress Notes (Signed)
Patient ID: Christopher [name], male   DOB: 03-19-1961, 55 y.o.   MRN: [id number] EVENING ROUNDS NOTE :     [address]       [place] [address]             [phone]                 20 Days Post-Op Procedure(s) (LRB): Right/Left Heart Cath and Coronary/Graft Angiography (N/A) Intra-Aortic Balloon Pump Insertion  Total Length of Stay:  LOS: 33 days  BP 124/76 mmHg  Pulse 83  Temp(Src) 98.2 F (36.8 C) (Oral)  Resp 19  Ht 5' 10.5" (1.791 m)  Wt 232 lb 12.9 oz (105.6 kg)  BMI 32.92 kg/m2  SpO2 95%  .Intake/Output      04/16 0701 - 04/17 0700   I.V. (mL/kg) 550 (5.2)   NG/GT 1150   Total Intake(mL/kg) 1700 (16.1)   Urine (mL/kg/hr) 2175 (1.7)   Stool 0 (0)   Total Output 2175   Net -475       Urine Occurrence 2 x   Stool Occurrence 3 x     . sodium chloride Stopped (11/15/15 0600)  . dextrose 50 mL/hr at 11/22/15 1800  . feeding supplement (VITAL HIGH PROTEIN) 1,000 mL (11/22/15 1856)     Lab Results  Component Value Date   WBC 9.8 11/22/2015   HGB 10.9* 11/22/2015   HCT 35.1* 11/22/2015   PLT 247 11/22/2015   GLUCOSE 115* 11/22/2015   CHOL 150 10/21/2015   TRIG 171* 10/21/2015   HDL 18* 10/21/2015   LDLCALC 98 10/21/2015   ALT 51 11/17/2015   AST 50* 11/17/2015   NA 145 11/22/2015   K 4.8 11/22/2015   CL 110 11/22/2015   CREATININE 1.64* 11/22/2015   BUN 51* 11/22/2015   CO2 26 11/22/2015   TSH 7.432* 10/30/2015   INR 1.44 11/11/2015   HGBA1C 6.2* 10/23/2015   Stable day , ambulating better, tolerates PMV   Delight Ovens MD  Beeper [phone] Office [phone] 11/22/2015 7:20 PM

## 2015-11-23 ENCOUNTER — Inpatient Hospital Stay (HOSPITAL_COMMUNITY): Payer: BLUE CROSS/BLUE SHIELD

## 2015-11-23 LAB — GLUCOSE, CAPILLARY
GLUCOSE-CAPILLARY: 100 mg/dL — AB (ref 65–99)
GLUCOSE-CAPILLARY: 95 mg/dL (ref 65–99)
Glucose-Capillary: 100 mg/dL — ABNORMAL HIGH (ref 65–99)
Glucose-Capillary: 107 mg/dL — ABNORMAL HIGH (ref 65–99)
Glucose-Capillary: 91 mg/dL (ref 65–99)
Glucose-Capillary: 94 mg/dL (ref 65–99)

## 2015-11-23 LAB — CBC
HCT: 35.4 % — ABNORMAL LOW (ref 39.0–52.0)
Hemoglobin: 10.8 g/dL — ABNORMAL LOW (ref 13.0–17.0)
MCH: 27.6 pg (ref 26.0–34.0)
MCHC: 30.5 g/dL (ref 30.0–36.0)
MCV: 90.3 fL (ref 78.0–100.0)
Platelets: 243 10*3/uL (ref 150–400)
RBC: 3.92 MIL/uL — ABNORMAL LOW (ref 4.22–5.81)
RDW: 17.4 % — ABNORMAL HIGH (ref 11.5–15.5)
WBC: 8.5 10*3/uL (ref 4.0–10.5)

## 2015-11-23 LAB — BASIC METABOLIC PANEL
Anion gap: 11 (ref 5–15)
BUN: 49 mg/dL — AB (ref 6–20)
CHLORIDE: 105 mmol/L (ref 101–111)
CO2: 27 mmol/L (ref 22–32)
CREATININE: 1.62 mg/dL — AB (ref 0.61–1.24)
Calcium: 8.6 mg/dL — ABNORMAL LOW (ref 8.9–10.3)
GFR calc Af Amer: 54 mL/min — ABNORMAL LOW (ref >60)
GFR calc non Af Amer: 47 mL/min — ABNORMAL LOW (ref >60)
Glucose, Bld: 101 mg/dL — ABNORMAL HIGH (ref 65–99)
POTASSIUM: 4.2 mmol/L (ref 3.5–5.1)
Sodium: 143 mmol/L (ref 135–145)

## 2015-11-23 LAB — MAGNESIUM: Magnesium: 2 mg/dL (ref 1.7–2.4)

## 2015-11-23 MED ORDER — CHLORHEXIDINE GLUCONATE 0.12 % MT SOLN
15.0000 mL | Freq: Two times a day (BID) | OROMUCOSAL | Status: DC
  Administered 2015-11-23 – 2015-11-24 (×3): 15 mL via OROMUCOSAL
  Filled 2015-11-23 (×3): qty 15

## 2015-11-23 MED ORDER — CETYLPYRIDINIUM CHLORIDE 0.05 % MT LIQD
7.0000 mL | Freq: Two times a day (BID) | OROMUCOSAL | Status: DC
  Administered 2015-11-23 – 2015-11-24 (×3): 7 mL via OROMUCOSAL

## 2015-11-23 MED ORDER — FUROSEMIDE 10 MG/ML IJ SOLN: 40.0000 mg | Freq: Every day | INTRAMUSCULAR | Status: DC

## 2015-11-23 NOTE — Progress Notes (Addendum)
Speech Language Pathology Treatment: Dysphagia;Passy Muir Speaking valve  Patient Details Name: Christopher [name] MRN: [id number] DOB: Mar 31, 1961 Today's Date: 11/23/2015 Time: 0223-3612 SLP Time Calculation (min) (ACUTE ONLY): 17 min  Assessment / Plan / Recommendation Clinical Impression  Pt upright in chair with PMSV in place upon SLP arrival, stating that he has had the speaking valve in place all day yesterday and all morning today. His voice is strong and clear, and he and his girlfriend believe that he is very near his baseline quality. SLP provided review of results/recommendations from [facility] on Thursday and Saturday and need for continued NPO status with use of therapeutic ice chips to facilitate strength. SLP provided demonstration followed by Min cues for accurate completion of effortful swallows and masako maneuver. Pt believes his swallowing is improving in strength. Recommend to use PMSV during waking hours and to continue with ice chips only at this time to allow for additional increase in strength prior to repeat testing (per MD note, it appears as though they would like [place] to re-attempt on Wednesday).   HPI HPI: 55 y.o. male w/ ischemic cardiomyopathy following MI s/p CABG & CEA. Post-operative course complicated by arrhythmia and biventricular failure, HCAP, heart block. PMHx HTN, stenosis of R carotid artery, TIA. Intubated 3/21-3/22, re-intubated 3/26 and failed to wean, trach placed 4/6.      SLP Plan  Continue with current plan of care     Recommendations  Diet recommendations: NPO Medication Administration: Via alternative means             Oral Care Recommendations: Oral care QID;Oral care prior to ice chip/H20 Follow up Recommendations: Inpatient Rehab Plan: Continue with current plan of care     GO               [name], M.A. CCC-SLP [phone]  [name] 11/23/2015, 11:56 AM

## 2015-11-23 NOTE — Progress Notes (Addendum)
Patient ID: Christopher [name], male   DOB: 04/22/1961, 55 y.o.   MRN: [id number]   ADVANCED HF ROUNDING NOTE  Subjective:  Has PM valve. On room air. Failed swallow study 4/14.  Planning repeat on Wed. Has walked around the unit.   Denies SOB/Orthopnea.     Scheduled Meds: . amiodarone  200 mg Oral BID  . antiseptic oral rinse  7 mL Mouth Rinse q12n4p  . aspirin  81 mg Oral Daily  . atorvastatin  80 mg Oral q1800  . chlorhexidine  15 mL Mouth Rinse BID  . enoxaparin (LOVENOX) injection  40 mg Subcutaneous Q24H  . feeding supplement (PRO-STAT SUGAR FREE 64)  30 mL Per Tube TID  . free water  200 mL Per Tube Q4H  . hydrALAZINE  25 mg Oral 3 times per day  . insulin aspart  0-24 Units Subcutaneous 6 times per day  . insulin detemir  12 Units Subcutaneous BID  . levalbuterol  1.25 mg Nebulization TID  . pantoprazole sodium  40 mg Per Tube Daily  . potassium chloride  40 mEq Oral 3 times per day  . QUEtiapine  50 mg Oral QHS  . sildenafil  40 mg Oral TID  . silver sulfADIAZINE   Topical BID  . sodium chloride flush  10-40 mL Intracatheter Q12H  . torsemide  60 mg Oral Daily  . triamcinolone cream   Topical BID   Continuous Infusions: . sodium chloride Stopped (11/15/15 0600)  . dextrose 50 mL/hr at 11/23/15 0600  . feeding supplement (VITAL HIGH PROTEIN) 1,000 mL (11/23/15 0600)   PRN Meds:.diphenhydrAMINE, fentaNYL (SUBLIMAZE) injection, levalbuterol, midazolam, ondansetron (ZOFRAN) IV, sodium chloride flush, traMADol    Filed Vitals:   11/23/15 0600 11/23/15 0700 11/23/15 0710 11/23/15 0733  BP: 114/80 115/79  115/79  Pulse: 78 78  84  Temp:   98.4 F (36.9 C)   TempSrc:   Oral   Resp: 18 18  16   Height:      Weight:      SpO2: 99% 98%  99%    Intake/Output Summary (Last 24 hours) at 11/23/15 0858 Last data filed at 11/23/15 0600  Gross per 24 hour  Intake   2800 ml  Output   2950 ml  Net   -150 ml    LABS: Basic Metabolic Panel:  Recent Labs   11/22/15 0534 11/23/15 0410  NA 145 143  K 4.8 4.2  CL 110 105  CO2 26 27  GLUCOSE 115* 101*  BUN 51* 49*  CREATININE 1.64* 1.62*  CALCIUM 8.7* 8.6*  MG 2.0 2.0   Liver Function Tests: No results for input(s): AST, ALT, ALKPHOS, BILITOT, PROT, ALBUMIN in the last 72 hours. No results for input(s): LIPASE, AMYLASE in the last 72 hours. CBC:  Recent Labs  11/22/15 0534 11/23/15 0410  WBC 9.8 8.5  HGB 10.9* 10.8*  HCT 35.1* 35.4*  MCV 92.1 90.3  PLT 247 243   Cardiac Enzymes: No results for input(s): CKTOTAL, CKMB, CKMBINDEX, TROPONINI in the last 72 hours. BNP: Invalid input(s): POCBNP D-Dimer: No results for input(s): DDIMER in the last 72 hours. Hemoglobin A1C: No results for input(s): HGBA1C in the last 72 hours. Fasting Lipid Panel: No results for input(s): CHOL, HDL, LDLCALC, TRIG, CHOLHDL, LDLDIRECT in the last 72 hours. Thyroid Function Tests: No results for input(s): TSH, T4TOTAL, T3FREE, THYROIDAB in the last 72 hours.  Invalid input(s): FREET3 Anemia Panel: No results for input(s): VITAMINB12, FOLATE, FERRITIN, TIBC, IRON,  RETICCTPCT in the last 72 hours.  RADIOLOGY: Dg Abd 1 View  11/03/2015  CLINICAL DATA:  Enteric tube advanced to post pyloric position. EXAM: ABDOMEN - 1 VIEW COMPARISON:  11/02/2015 abdominal radiographs. FINDINGS: Fluoroscopy time 7 minutes. Enteric tube courses through the stomach and duodenum and appears to terminate near the duodenal jejunal junction. Instilled contrast opacifies nondilated proximal jejunal small bowel loops. No contrast leak is demonstrated. Separate enteric tube terminates in the body of the stomach. Visualized lower sternotomy wires appear aligned and intact. IMPRESSION: Enteric tube appears to terminate near the duodenal jejunal junction. Separate enteric tube terminates in the body of the stomach. Electronically Signed   By: Delbert Phenix M.D.   On: 11/03/2015 17:02   Dg Chest Port 1 View  11/21/2015  CLINICAL  DATA:  55 year old male with admission 10/20/2015 for shortness of breath and coronary artery disease. Combined carotid endarterectomy and CABG performed 10/27/2015. EXAM: PORTABLE CHEST 1 VIEW COMPARISON:  Multiple prior, most recent 11/20/2015 FINDINGS: Surgical changes of median sternotomy and CABG. Unchanged right upper extremity PICC. Unchanged tracheostomy. Unchanged left subclavian central venous catheter. Improved aeration on the left upper lung with persisting left basilar opacity obscuring the left hemidiaphragm and the left heart border. Persisting interstitial and airspace disease at the right base. No visualized pneumothorax. IMPRESSION: Improving aeration on the left, with persisting basilar opacity, potentially combination of pleural fluid, atelectasis/ consolidation, and/ or edema. Mixed airspace disease at the right base, potentially atelectasis or consolidation. Unchanged support apparatus as above. Signed, Yvone Neu. Loreta Ave, DO [facility] Electronically Signed   By: Gilmer Mor D.O.   On: 11/21/2015 09:40   Dg Chest Port 1 View  11/20/2015  CLINICAL DATA:  Shortness of breath. EXAM: PORTABLE CHEST 1 VIEW COMPARISON:  11/18/2015 . FINDINGS: Tracheostomy tube, right PICC line, feeding tube in stable position. Prior CABG. Cardiomegaly. Worsening left lung infiltrate. Worsening left pleural effusion. Interim partial clearing of right base atelectasis and or infiltrate . No pneumothorax. IMPRESSION: 1. Lines and tubes in stable position. 2. Progressive left lung infiltrate consistent with pneumonia. Progressive left pleural effusion. 3. Interim partial clearing of right base atelectasis and/or infiltrate. 4.  Prior CABG.  Stable severe cardiomegaly. Electronically Signed   By: Maisie Fus  Register   On: 11/20/2015 07:36   Dg Chest Port 1 View  11/18/2015  CLINICAL DATA:  Respiratory failure. EXAM: PORTABLE CHEST 1 VIEW COMPARISON:   11/17/2015. FINDINGS: Tracheostomy tube, feeding tube, right PICC line, left subclavian line in stable position. Prior CABG. Stable cardiomegaly. Persistent left lower lobe infiltrate/edema again noted. Right base atelectasis and/or infiltrate/edema noted. Small left pleural effusion . These changes may be related to bibasilar pneumonia and/or congestive heart failure . The thorax. IMPRESSION: 1. Lines and tubes in stable position . 2.  Prior CABG.  Persistent cardiomegaly. 3. Persistent left lower lobe infiltrate and small left pleural effusion. Developing right lower lobe infiltrate. These changes may be related congestive heart failure with pulmonary edema. Bilateral pneumonia could also present in this fashion. Electronically Signed   By: Maisie Fus  Register   On: 11/18/2015 07:41   Dg Chest Port 1 View  11/17/2015  CLINICAL DATA:  Shortness of breath. EXAM: PORTABLE CHEST 1 VIEW COMPARISON:  11/16/2015. FINDINGS: Tracheostomy tube, feeding tube, right PICC line, left subclavian central line in stable position. Prior CABG. Cardiomegaly. Left lower lobe infiltrate a small left pleural effusion. Mild right base infiltrate. No pneumothorax. IMPRESSION: 1.  Lines and tubes in  stable position. 2. Persistent left lower lobe infiltrate and left pleural effusion. No interim change. Mild right lower lobe infiltrate. No change from prior exam . 3.  Stable cardiomegaly. Electronically Signed   By: Maisie Fus  Register   On: 11/17/2015 07:19   Dg Chest Port 1 View  11/16/2015  CLINICAL DATA:  Follow-up respiratory failure EXAM: PORTABLE CHEST 1 VIEW COMPARISON:  11/15/2014 FINDINGS: Prior CABG. Tracheostomy tube, right PICC line and left subclavian central line remain in place, unchanged. There is cardiomegaly with vascular congestion. Continued left lower lobe atelectasis or pneumonia and suspect small left pleural effusion. No significant change since prior study. No confluent opacity on the right. IMPRESSION: Stable left  lower lobe atelectasis or pneumonia with small left effusion. Stable cardiomegaly with vascular congestion. Electronically Signed   By: Charlett Nose M.D.   On: 11/16/2015 07:27   Dg Chest Port 1 View  11/15/2015  CLINICAL DATA:  Chronic ventilator dependent respiratory failure. CABG and right carotid endarterectomy 10/27/2015. EXAM: PORTABLE CHEST 1 VIEW COMPARISON:  11/14/2015 and earlier. FINDINGS: Tracheostomy tube tip in satisfactory position below the thoracic inlet. Left subclavian central venous catheter tip projects over the mid SVC, unchanged. Right arm PICC tip projects over lower SVC at or near the cavoatrial junction, unchanged. Feeding tube courses below the diaphragm into the stomach. Sternotomy for CABG. Cardiac silhouette moderately enlarged. Mild pulmonary venous hypertension without overt edema, unchanged. Dense consolidation in the left lower lobe with air bronchograms, unchanged. Moderate-sized left pleural effusion, unchanged. No new pulmonary parenchymal abnormalities. IMPRESSION: 1.  Support apparatus satisfactory. 2. Stable dense left lower lobe atelectasis and/or pneumonia and left pleural effusion. 3. Stable cardiomegaly and pulmonary venous hypertension without overt edema. 4. No new abnormalities. Electronically Signed   By: Hulan Saas M.D.   On: 11/15/2015 07:22   Dg Chest Port 1 View  11/14/2015  CLINICAL DATA:  55 year old male with a history of shortness of breath. Patient admission 10/20/2015. Right carotid endarterectomy 10/27/2015, with same-day coronary artery bypass grafting 10/27/2015. Twelve days postop now with history of intra-aortic balloon pump. EXAM: PORTABLE CHEST 1 VIEW COMPARISON:  11/13/2015, 11/12/2015, 11/11/2015 FINDINGS: Cardiomediastinal silhouette likely unchanged, with the left heart border partially obscured by overlying lung and pleural disease. Low lung volumes persist with bibasilar opacities and obscuration of the retrocardiac region. Obscuration  of left hemidiaphragm. Unchanged tracheostomy tube, enteric feeding tube terminating out of the field of view, right upper extremity PICC and left subclavian central line. IMPRESSION: Similar appearance of the chest x-ray, with persisting bibasilar airspace opacities and left pleural effusion. Unchanged support apparatus as above. Signed, Yvone Neu. Loreta Ave, DO [facility] Electronically Signed   By: Gilmer Mor D.O.   On: 11/14/2015 08:48   Dg Chest Port 1 View  11/13/2015  CLINICAL DATA:  Shortness of breath. EXAM: PORTABLE CHEST 1 VIEW COMPARISON:  November 12, 2015. FINDINGS: Stable cardiomegaly. Status post coronary bypass graft. Tracheostomy and feeding tube are unchanged. Bilateral subclavian catheters are unchanged. No pneumothorax is noted. Stable bibasilar opacities are noted with left greater than right. Stable left pleural effusion is noted. Bony thorax is unremarkable. IMPRESSION: Stable support apparatus. Stable bibasilar opacities. Stable left pleural effusion. Electronically Signed   By: Lupita Raider, M.D.   On: 11/13/2015 07:37   Dg Chest Port 1 View  11/12/2015  CLINICAL DATA:  Respiratory failure. EXAM: PORTABLE CHEST 1 VIEW COMPARISON:  11/11/2015. FINDINGS: Tracheostomy tube, feeding tube, left IJ line, right PICC  line in stable position . Prior CABG. Cardiomegaly with diffuse bilateral pulmonary infiltrates consistent pulmonary edema. Progressed from prior exam. Small bilateral pleural effusions. No pneumothorax. IMPRESSION: 1. Lines and tubes in stable position. 2. Prior CABG. Cardiomegaly with progressive bilateral pulmonary infiltrates consistent with pulmonary edema. Small bilateral pleural effusions are noted. Electronically Signed   By: Maisie Fus  Register   On: 11/12/2015 07:46   Dg Chest Port 1 View  11/11/2015  CLINICAL DATA:  Tracheostomy placement EXAM: PORTABLE CHEST 1 VIEW COMPARISON:  11/11/2015 FINDINGS: Tracheostomy  tube has been placed with the tip projecting over the mid trachea. No pneumothorax. Right PICC line remains in place, unchanged as does left central line. Cardiomegaly with bilateral perihilar and lower lobe opacities, likely edema. Suspect layering effusions. IMPRESSION: Interval placement tracheostomy tube without pneumothorax. Suspected CHF. Bilateral effusions. Findings similar to prior study. Electronically Signed   By: Charlett Nose M.D.   On: 11/11/2015 14:50   Dg Chest Port 1 View  11/11/2015  CLINICAL DATA:  Respiratory failure. EXAM: PORTABLE CHEST 1 VIEW COMPARISON:  11/10/2015. FINDINGS: Endotracheal tube, feeding tube, bilateral subclavian lines in stable position. Prior CABG. Cardiomegaly with diffuse bilateral pulmonary infiltrates consistent with pulmonary edema. Worsening from prior exam. Small left pleural effusion. No pneumothorax. IMPRESSION: 1.  Lines and tubes in stable position. 2. Prior CABG. Cardiomegaly with worsening bilateral pulmonary edema. Small left pleural effusion. Electronically Signed   By: Maisie Fus  Register   On: 11/11/2015 07:17   Dg Chest Port 1 View  11/10/2015  CLINICAL DATA:  Shortness of breath . EXAM: PORTABLE CHEST 1 VIEW COMPARISON:  11/09/2015. FINDINGS: Endotracheal tube, NG tube, right PICC line, left subclavian line in stable position. Prior CABG. Cardiomegaly. Left lower lobe atelectasis and/or infiltrate with small left pleural effusion. Mild right base atelectasis and or infiltrate. No pneumothorax . IMPRESSION: 1.  Lines and tubes in stable position. 2. Left lower lobe atelectasis and/or infiltrate with small left pleural effusion. Mild right base atelectasis and/or infiltrate. Findings stable from prior exam. 3.  Prior CABG.  Stable cardiomegaly. Electronically Signed   By: Maisie Fus  Register   On: 11/10/2015 07:51   Dg Chest Port 1 View  11/09/2015  CLINICAL DATA:  Atelectasis. EXAM: PORTABLE CHEST 1 VIEW COMPARISON:  11/08/2015 FINDINGS: Endotracheal tube in  satisfactory position. Right arm PICC tip in the lower SVC unchanged. Left subclavian central venous catheter in the SVC. NG tube and feeding tube enters the stomach with the tips not visualized. Negative for pneumothorax. Left lower lobe airspace disease unchanged. Small left effusion unchanged. Mild pulmonary vascular congestion unchanged. IMPRESSION: Support lines remain in satisfactory position and unchanged Left lower lobe atelectasis/ infiltrate unchanged. Mild right lower lobe airspace consolidation stable. Mild vascular congestion unchanged. Electronically Signed   By: Marlan Palau M.D.   On: 11/09/2015 07:36   Dg Chest Port 1 View  11/08/2015  CLINICAL DATA:  Respiratory failure, hypertension, coronary artery disease post MI and CABG EXAM: PORTABLE CHEST 1 VIEW COMPARISON:  Portable exam 1216 hours compared to 11/06/2015 FINDINGS: Tip of endotracheal tube projects 6.8 cm above carina. Nasogastric tube and feeding tube extends into stomach. LEFT subclavian central venous catheter with tip projecting over SVC. RIGHT arm PICC line tip projects over SVC. Enlargement of cardiac silhouette post CABG. Stable mediastinal contours. Increased LEFT lower lobe consolidation. Slightly increased infiltrate at RIGHT base as well. Minimal central peribronchial thickening. No pleural effusion or pneumothorax. IMPRESSION: Enlargement of cardiac silhouette post CABG. Stable line and tube positions.  Increased LEFT lower lobe consolidation with minimally increased infiltrate at RIGHT base as well. Findings are suspicious for pneumonia involving the LEFT lower lobe though a component of coexisting pulmonary edema may be present. Electronically Signed   By: Ulyses Southward M.D.   On: 11/08/2015 12:47   Dg Chest Port 1 View  11/06/2015  CLINICAL DATA:  Acute CHF secondary to acute MI, complete heart block. EXAM: PORTABLE CHEST 1 VIEW COMPARISON:  Portable chest x-ray of November 05, 2015 FINDINGS: The lungs are adequately  inflated. The pulmonary interstitial markings are less prominent on the right today. They have improved on the left as well and the hemidiaphragm is now visible. There is persistent interstitial edema on the left. The cardiac silhouette remains enlarged. The intraaortic balloon pump marker tip projects over the proximal portion of descending thoracic aorta and appears stable. The endotracheal tube tip projects 5.5 cm above the carina. The nasogastric and Doppler AH feeding tubes have their tips below the inferior margin of the image. The right-sided PICC line tip projects over the junction of the middle and distal thirds of the SVC. The left subclavian venous catheter tip projects over the midportion of the SVC. IMPRESSION: Decreasing pulmonary edema consistent with improvement in CHF. Persistent perihilar interstitial edema on the left. The support tubes and devices are in reasonable position. Electronically Signed   By: David  [place] M.D.   On: 11/06/2015 07:32   Dg Chest Port 1 View  11/05/2015  CLINICAL DATA:  Respiratory failure. EXAM: PORTABLE CHEST 1 VIEW COMPARISON:  11/04/2015 . FINDINGS: Endotracheal tube, NG tube, right PICC line stable position. Prior CABG. Cardiomegaly. Persistent left mid and lower lung field infiltrate. Persistent mild atelectatic changes right upper and right lower lobes. No pneumothorax. IMPRESSION: 1.  Lines and tubes in stable position. 2. Persistent left mid and left lower lung infiltrate, no interim change. Persistent right upper lobe and right lower lobe subsegmental atelectasis. No interim change. 3. Prior CABG.  Stable cardiomegaly. Electronically Signed   By: Maisie Fus  Register   On: 11/05/2015 07:48   Dg Chest Port 1 View  11/04/2015  CLINICAL DATA:  Hypoxia EXAM: PORTABLE CHEST 1 VIEW COMPARISON:  November 04, 2015 FINDINGS: Endotracheal tube tip is 5.3 cm above the carina. Central catheter tip is in the superior vena cava near the cavoatrial junction. Nasogastric tube  tip and side port are below the diaphragm. There also is a feeding tube with the tip below the diaphragm. No pneumothorax. There has been removal of a left chest tube. There is airspace consolidation throughout the left mid and lower lung zones, stable. There is mild atelectasis in the right upper lobe, stable. No new opacity is evident. There is stable cardiomegaly. The pulmonary vascularity is normal. No adenopathy is evident. IMPRESSION: Tube and catheter positions as described without pneumothorax. Airspace consolidation throughout the left mid and lower lung zones, stable. Atelectasis right upper lobe, stable. No new opacity. Stable cardiomegaly. Electronically Signed   By: Bretta Bang III M.D.   On: 11/04/2015 14:52   Dg Chest Port 1 View  11/04/2015  CLINICAL DATA:  Status post CABG 10/28/2015. EXAM: PORTABLE CHEST 1 VIEW COMPARISON:  Single view of the chest 11/03/2015 and 11/02/2015. FINDINGS: Support tubes and lines including a left chest tube are unchanged since the most recent exam. The chest is better expanded today with decreased atelectasis. Airspace opacity in the left mid and lower lung zones persists. There is likely a left pleural effusion. Marked cardiomegaly is  seen with only mild vascular congestion noted. IMPRESSION: Increased pulmonary expansion with decreased scattered atelectasis. Left mid and lower lung zone airspace disease could be due to atelectasis or pneumonia. Small left pleural effusion noted. Support tubes and lines projecting good position.  No pneumothorax. Electronically Signed   By: Drusilla Kanner M.D.   On: 11/04/2015 07:30   Dg Chest Port 1 View  11/03/2015  CLINICAL DATA:  Central line placement. EXAM: PORTABLE CHEST 1 VIEW COMPARISON:  11/03/2015. FINDINGS: Left subclavian central line noted with tip at the cavoatrial junction. Interval removal of right chest tube. Endotracheal tube, NG tube, right PICC line, left IJ sheath left chest tube in stable position.  No pneumothorax. Prior CABG. Stable cardiomegaly. Low lung volumes. A mild component of congestive heart failure cannot be excluded. Similar findings noted on prior exam P IMPRESSION: 1. Interim placement of left subclavian central line, its tip is at the cavoatrial junction. Interim removal right chest tube . No pneumothorax. 2. Remaining lines and tubes including left chest tube in stable position. 3. Prior CABG. Stable cardiomegaly. Low lung volumes. A a mild component congestive heart failure cannot be excluded . Similar findings noted on prior exam. Electronically Signed   By: Maisie Fus  Register   On: 11/03/2015 09:58   Dg Chest Port 1 View  11/03/2015  CLINICAL DATA:  Status post CABG on October 27, 2015 persistent left lower lobe atelectasis or pneumonia. No pneumothorax or significant pleural effusion. EXAM: PORTABLE CHEST 1 VIEW COMPARISON:  Portable chest x-ray of November 02, 2015 at 5:48 a.m. FINDINGS: There has been interval placement of an intra aortic balloon pump whose marker lies over the junction of the aortic arch with the proximal descending thoracic aorta. The cardiac silhouette remains enlarged. The pulmonary vascularity remains mildly prominent. The retrocardiac region on the left remains dense. Bilateral chest tubes are in stable position. There is no pneumothorax nor significant pleural effusion. The endotracheal tube tip lies approximately 6.5 cm above the carina. The esophagogastric tube tip projects below the inferior margin of the image. The right-sided PICC line tip projects over the midportion of the SVC. A previously demonstrated pericardial drain is not clearly evident on today's study. IMPRESSION: Mild CHF. Persistent left lower lobe atelectasis or pneumonia. Interval placement of an intra aortic balloon pump which appears to be in appropriate position radiographically. The other support tubes and lines are in reasonable position. Electronically Signed   By: David  [place] M.D.   On:  11/03/2015 07:27   Dg Chest Port 1 View  11/02/2015  CLINICAL DATA:  CABG. EXAM: PORTABLE CHEST 1 VIEW COMPARISON:  11/01/2015. FINDINGS: Endotracheal tube, NG tube, right PICC line, bilateral chest tubes in stable position. Prior CABG. Stable cardiomegaly . Persistent mild interstitial prominence, congestive heart failure cannot be excluded. No interim change. Low lung volumes with basilar atelectasis . IMPRESSION: 1. Endotracheal tube, NG tube, right PICC line, bilateral chest tubes in stable position . No pneumothorax. 2. Prior CABG. Cardiomegaly with pulmonary interstitial prominence and small left pleural effusion consistent with mild congestive heart failure. No interim change from prior exam . Electronically Signed   By: Maisie Fus  Register   On: 11/02/2015 07:20   Dg Chest Port 1 View  11/01/2015  CLINICAL DATA:  Post CABG EXAM: PORTABLE CHEST 1 VIEW COMPARISON:  10/31/2015 FINDINGS: Cardiomediastinal silhouette is stable. Status post CABG. Stable endotracheal and NG tube position. Left chest tube is unchanged in position. Persistent central vascular congestion and mild interstitial prominence bilaterally suspicious  for the mild interstitial edema. Left IJ sheath in place. Probable small left pleural effusion left basilar atelectasis or infiltrate. Right arm PICC line is unchanged in position. There is no pneumothorax. IMPRESSION: Status post CABG. Stable support apparatus. Stable left chest tube position. No pneumothorax. Again noted bilateral mild interstitial prominence and perihilar opacities suspicious for mild pulmonary edema. Probable small left pleural effusion left basilar atelectasis or infiltrate. Electronically Signed   By: Natasha Mead M.D.   On: 11/01/2015 09:57   Dg Chest Port 1 View  10/31/2015  CLINICAL DATA:  55 year old male with a history of endotracheal tube placement Patient has undergone coronary artery bypass grafting x4 10/27/2015. Placement of intra aortic balloon pump at this  date. Ischemic cardiomyopathy. EXAM: PORTABLE CHEST 1 VIEW COMPARISON:  10/30/2015. FINDINGS: Endotracheal tube terminates approximately 3.4 cm above the carina, unchanged. Gastric tube terminates out of the field of view. Defibrillator pads project over the left and right chest. Bilateral thoracostomy tubes. Right upper extremity PICC. The mediastinal drain not visualized. Intra-aortic balloon pump not visualized. Surgical changes of median sternotomy and CABG. Mixed bilateral interstitial and airspace opacities. Low lung volumes. IMPRESSION: Low lung volumes with mixed bilateral interstitial and airspace opacities, similar to the comparison chest x-ray. Retrocardiac opacity may reflect persistent pleural fluid, and/or atelectasis. Surgical support apparatus appear unchanged, as above. Intra aortic balloon pump not visualized. Signed, Yvone Neu. Loreta Ave, DO [facility] Radiology Electronically Signed   By: Gilmer Mor D.O.   On: 10/31/2015 07:25   Dg Chest Port 1 View  10/30/2015  CLINICAL DATA:  Post cavity.  Recent CABG. EXAM: PORTABLE CHEST 1 VIEW COMPARISON:  Portable film earlier today. FINDINGS: Overlying support apparatus redemonstrated. Tubes and lines remain stable. Elevated LEFT hemidiaphragm. No definite pneumothorax. Moderate vascular congestion may be increased. Cardiomegaly. IMPRESSION: Slight worsening aeration. Moderate vascular congestion may be increased. Electronically Signed   By: Elsie Stain M.D.   On: 10/30/2015 18:39   Dg Chest Port 1 View  10/30/2015  CLINICAL DATA:  Status post coronary bypass grafting EXAM: PORTABLE CHEST 1 VIEW COMPARISON:  10/30/2015 FINDINGS: Cardiac shadow remains enlarged. Bilateral chest tubes are again seen. A right-sided PICC line and endotracheal tube are noted in satisfactory position. Left jugular sheath is noted. Elevation of left hemidiaphragm is noted with left basilar atelectasis. No pneumothorax is  noted. IMPRESSION: Postsurgical change with tubes and lines as described. Mild left basilar atelectasis. Electronically Signed   By: Alcide Clever M.D.   On: 10/30/2015 07:45   Dg Chest Port 1 View  10/30/2015  CLINICAL DATA:  A chest tube in place EXAM: PORTABLE CHEST 1 VIEW COMPARISON:  10/30/2015 FINDINGS: Postoperative changes in the mediastinum. Endotracheal tube with tip measuring 6.3 cm about the carina. Right PICC catheter with tip over the cavoatrial junction. Left central venous catheter sheath with tip over the left neck consistent location in the internal jugular vein. Bilateral chest tubes. Infiltration or atelectasis in the right lung base. Cardiac enlargement. No significant vascular congestion. No pneumothorax. IMPRESSION: Appliance positioned as described. Cardiac enlargement with infiltration or atelectasis in the right lung base. Bilateral chest tubes are present but no visualized pneumothorax. Electronically Signed   By: Burman Nieves M.D.   On: 10/30/2015 03:07   Dg Chest Port 1 View  10/30/2015  CLINICAL DATA:  Shortness of breath.  Code blue. EXAM: PORTABLE CHEST 1 VIEW COMPARISON:  10/29/2015 FINDINGS: Postoperative changes in the mediastinum. Bilateral chest tubes are present. Right  PICC line with tip over the cavoatrial junction. Left central venous catheter or sheath with tip over the left side of the neck, likely in the left internal jugular vein. Cardiac enlargement without significant vascular congestion. Shallow inspiration with elevation of the left hemidiaphragm. Probable left pleural effusion. No definite pulmonary consolidation. IMPRESSION: Shallow inspiration. Probable left pleural effusion. Cardiac enlargement. Appliances appear in satisfactory position. Electronically Signed   By: Burman Nieves M.D.   On: 10/30/2015 01:18   Dg Chest Port 1 View  10/29/2015  CLINICAL DATA:  Postop CABG 2 days ago. EXAM: PORTABLE CHEST 1 VIEW COMPARISON:  Portable chest x-ray of  October 28, 2015 FINDINGS: The trachea and esophagus have been extubated. The cardiac silhouette remains enlarged. The pulmonary vascularity is mildly engorged. The bilateral chest tubes and the mediastinal drain are in stable position. There is no pneumothorax or large pleural effusion. The Swan-Ganz catheter tip overlies a proximal right lower lobe pulmonary artery branch. The mediastinum is widened and accentuated by the hypo inflation. IMPRESSION: Bilateral hypoinflation following extubation of the trachea there is crowding of the pulmonary vascularity. Left basilar atelectasis or less likely pneumonia is more prominent today. Probable small left pleural effusion. The remaining support tubes and lines are in stable position. Electronically Signed   By: David  Sw[place]iland M.D.   On: 10/29/2015 07:40   Dg Chest Port 1 View  10/28/2015  CLINICAL DATA:  Post CABG, on ventilator, followup portable chest x-ray of 10/28/2015 EXAM: PORTABLE CHEST 1 VIEW COMPARISON:  None. FINDINGS: The tip of the endotracheal tube is approximately 5.2 cm above the carina. Aeration has improved slightly. Atelectasis remains at the lung bases left-greater-than-right. Swan-Ganz catheter tip is in the right lower lobe pulmonary artery and bilateral chest tubes remain. No pneumothorax is seen. Heart size is stable. IMPRESSION: 1. Improved aeration. 2. Bilateral chest tubes.  No pneumothorax. 3. Tip of endotracheal tube approximately 5.2 cm above the carina. Electronically Signed   By: Dwyane Dee M.D.   On: 10/28/2015 08:01   Dg Chest Port 1 View  10/27/2015  CLINICAL DATA:  Status post CABG EXAM: PORTABLE CHEST 1 VIEW COMPARISON:  10/27/2015 FINDINGS: Unchanged tracheostomy tube. Swan-Ganz central venous line is now seen with tip advanced into the right perihilar region. Stable right chest tube with and left chest tube. NG tube again crosses the gastroesophageal junction. Stable mediastinal drain. Enlarged cardiac silhouette. Limited  inspiratory effect with bibasilar opacities likely representing atelectasis. IMPRESSION: Anticipated postoperative appearance with bibasilar atelectasis. Electronically Signed   By: Esperanza Heir M.D.   On: 10/27/2015 19:25   Dg Chest Portable 1 View  10/27/2015  CLINICAL DATA:  Status post coronary bypass grafting EXAM: PORTABLE CHEST 1 VIEW COMPARISON:  10/23/2015 FINDINGS: Cardiac shadow is mildly enlarged. A Swan-Ganz catheter is noted in the right pulmonary outflow tract. Bilateral thoracostomy catheters are seen. No pneumothorax is noted. An endotracheal tube is noted in satisfactory position. Intra-aortic balloon pump is noted over the aortic arch. Mild left basilar atelectasis is seen. Mild central vascular congestion is noted. IMPRESSION: Postoperative changes with tubes and lines as described above. Mild vascular congestion and left basilar atelectasis. Electronically Signed   By: Alcide Clever M.D.   On: 10/27/2015 17:23   Dg Abd Portable 1v  11/19/2015  CLINICAL DATA:  Encounter for enteric feeding tube placement. EXAM: PORTABLE ABDOMEN - 1 VIEW COMPARISON:  11/18/2015 FINDINGS: Enteric feeding tube tip now projects in the fourth portion of the duodenum, near the ligament of Treitz.  There is small bowel dilation which appears increased from the prior radiographs. IMPRESSION: Enteric feeding tube metallic tip now projects in the fourth portion of the duodenum near the ligament of Treitz. Increased small bowel dilation which may reflect an adynamic ileus or partial obstruction this is only partly imaged on this exam. Electronically Signed   By: Amie Portland M.D.   On: 11/19/2015 10:24   Dg Abd Portable 1v  11/18/2015  CLINICAL DATA:  55 year old male feeding tube placement. Initial encounter. EXAM: PORTABLE ABDOMEN - 1 VIEW COMPARISON:  11/16/2015. FINDINGS: Portable AP supine view at 1109 hours. A enteric tube placed, tip at the level of the distal second portion of the duodenum. Epicardial  pacer wires remain in place. Increased gaseous distension of large bowel in the abdomen, but no dilated small bowel loops. Mild gaseous distension of the stomach. No definite pneumoperitoneum on this supine view. Stable visualized osseous structures. IMPRESSION: 1. Enteric tube placed, it tip at the mid duodenum. 2. Increased large bowel gas, favor ileus over mechanical obstruction. Electronically Signed   By: Odessa Fleming M.D.   On: 11/18/2015 12:01   Dg Abd Portable 1v  11/16/2015  CLINICAL DATA:  Ileus, acute MI with CHF EXAM: PORTABLE ABDOMEN - 1 VIEW COMPARISON:  Portable abdominal films of November 15, 2015 FINDINGS: There remain small amounts of gas within small bowel loops as well as and large bowel loops. No abnormally distended bowel loops are observed. There is gas in the stomach. A feeding tube is in place whose tip appears to lie at the duodenal -jejunal junction. IMPRESSION: Fairly stable appearance of the abdomen with moderate amounts of gas within small and large bowel loops consistent with ileus. No free extraluminal gas collections are observed. Electronically Signed   By: David  [place]and M.D.   On: 11/16/2015 07:20   Dg Abd Portable 1v  11/15/2015  CLINICAL DATA:  Chronic ventilator dependent respiratory failure. Acute onset of nausea and vomiting. EXAM: PORTABLE ABDOMEN - 1 VIEW COMPARISON:  11/03/2015, 11/02/2015. FINDINGS: Feeding tube tip projects at the expected location of the 4th portion of the duodenum at or near the ligament Treitz. Nonobstructive bowel gas pattern. Gas in normal caliber loops of small bowel throughout the abdomen and pelvis. Gas throughout normal caliber colon from cecum to rectum. No suggestion of free intraperitoneal air. IMPRESSION: 1. Bowel gas pattern indicating a mild generalized ileus. No evidence of bowel obstruction. 2. Feeding tube tip appropriately positioned in the 4th portion of the duodenum at or near the ligament of Treitz. Electronically Signed   By: Hulan Saas M.D.   On: 11/15/2015 07:26   Dg Abd Portable 1v  11/02/2015  CLINICAL DATA:  Assess feeding tube positioning EXAM: PORTABLE ABDOMEN - 1 VIEW COMPARISON:  None in PACs FINDINGS: The repeated KUB with has less motion artifact. The tip of the feeding tube and nasogastric tube lie in the region of the distal gastric body in the pre-pyloric region. The bowel gas pattern is within the limits of normal. Numerous tubes and lines and electrodes overlie the abdomen. IMPRESSION: The tips of the feeding tube and the esophagogastric tube lie in the region of the distal gastric body in the pre-pyloric region. Electronically Signed   By: David  [place]and M.D.   On: 11/02/2015 12:16   Dg Vangie Bicker G Tube Plc W/fl-no Rad  11/03/2015  CLINICAL DATA:  NASO G TUBE PLACEMENT WITH FLUORO Fluoroscopy was utilized by the requesting physician.  No radiographic interpretation.  Dg Swallowing Func-speech Pathology  11/21/2015  Objective Swallowing Evaluation: Type of Study: MBS-Modified Barium Swallow Study Patient Details Name: JAXXSON CAVANAH MRN: [id number] Date of Birth: 02/12/1961 Today's Date: 11/21/2015 Time: SLP Start Time (ACUTE ONLY): 1129-SLP Stop Time (ACUTE ONLY): 1153 SLP Time Calculation (min) (ACUTE ONLY): 24 min Past Medical History: Past Medical History Diagnosis Date . Hypertension  . TIA (transient ischemic attack)  . Stenosis of right carotid artery  . Complete heart block (HCC) 10/20/2015 Past Surgical History: Past Surgical History Procedure Laterality Date . Arm surgery Right 2003 . Eye surgery   . Laceration repair   . Cardiac catheterization N/A 10/20/2015   Procedure: Left Heart Cath and Coronary Angiography;  Surgeon: Tonny Bollman, MD;  Location: [facility] INVASIVE CV LAB;  Service: Cardiovascular;  Laterality: N/A; . Coronary artery bypass graft N/A 10/27/2015   Procedure: CORONARY ARTERY BYPASS GRAFTING (CABG) x four, using left internal mammary artery and rt leg greater saphenous vein harvested  endoscopically;  Surgeon: Kerin Perna, MD;  Location: [facility] OR;  Service: Open Heart Surgery;  Laterality: N/A; . Tee without cardioversion N/A 10/27/2015   Procedure: TRANSESOPHAGEAL ECHOCARDIOGRAM (TEE);  Surgeon: Kerin Perna, MD;  Location: [facility] OR;  Service: Open Heart Surgery;  Laterality: N/A; . Endarterectomy Right 10/27/2015   Procedure: RIGHT ENDARTERECTOMY CAROTID with patch angioplasty using Xenosure bovine pericardium patch;  Surgeon: Fransisco Hertz, MD;  Location: [facility] OR;  Service: Vascular;  Laterality: Right; . Cardiac catheterization N/A 11/02/2015   Procedure: Right/Left Heart Cath and Coronary/Graft Angiography;  Surgeon: Laurey Morale, MD;  Location: [facility] INVASIVE CV LAB;  Service: Cardiovascular;  Laterality: N/A; HPI: 55 y.o. male w/ ischemic cardiomyopathy following MI s/p CABG & CEA. Post-operative course complicated by arrhythmia and biventricular failure, HCAP, heart block. PMHx HTN, stenosis of R carotid artery, TIA. Intubated 3/21-3/22, re-intubated 3/26 and failed to wean, trach placed 4/6. Subjective: pt alert, anxious for PO Assessment / Plan / Recommendation CHL IP CLINICAL IMPRESSIONS 11/21/2015 Therapy Diagnosis Moderate pharyngeal phase dysphagia;Severe pharyngeal phase dysphagia Clinical Impression Pt reassessed with use of PMSV during MBS. Pt with improved subglottic pressure for swallow function with use of the PMSV however continues to exhibit moderate to severe sensory motor pharygneal dysphagia . Pt with delay in swallow initiation across all consistencies. Isolated puree trials yielded moderate pharyngeal residuals secondary to reduced tongue base retraction and decreased laryngeal elevation.  Deep penetration of honey thick liquids evidenced after the swallow which was impacted by pharygneal residuals from previous puree trials in the valleculae and pyriform sinuses.  This was intermittently sensed when in greater amounts. Pharyngeal residual amount decreased with nectar thick  liquids, however airway protection compromised with aspiration before the swallow despite use of chin tuck. Recommend continue NPO execpt for ice chips with further PO trials and extended use of PMSV for improved pharygneal sensation. ST to continue to follow up.   Impact on safety and function Severe aspiration risk   CHL IP TREATMENT RECOMMENDATION 11/21/2015 Treatment Recommendations Therapy as outlined in treatment plan below   Prognosis 11/21/2015 Prognosis for Safe Diet Advancement Good Barriers to Reach Goals Severity of deficits Barriers/Prognosis Comment -- CHL IP DIET RECOMMENDATION 11/21/2015 SLP Diet Recommendations NPO Liquid Administration via -- Medication Administration Via alternative means Compensations -- Postural Changes --   CHL IP OTHER RECOMMENDATIONS 11/21/2015 Recommended Consults -- Oral Care Recommendations Oral care QID Other Recommendations (No Data)   CHL IP FOLLOW UP RECOMMENDATIONS 11/21/2015 Follow up Recommendations Inpatient Rehab  CHL IP FREQUENCY AND DURATION 11/21/2015 Speech Therapy Frequency (ACUTE ONLY) min 2x/week Treatment Duration 2 weeks      CHL IP ORAL PHASE 11/21/2015 Oral Phase WFL Oral - Pudding Teaspoon -- Oral - Pudding Cup -- Oral - Honey Teaspoon -- Oral - Honey Cup -- Oral - Nectar Teaspoon -- Oral - Nectar Cup -- Oral - Nectar Straw -- Oral - Thin Teaspoon -- Oral - Thin Cup -- Oral - Thin Straw -- Oral - Puree -- Oral - Mech Soft -- Oral - Regular -- Oral - Multi-Consistency -- Oral - Pill -- Oral Phase - Comment --  CHL IP PHARYNGEAL PHASE 11/21/2015 Pharyngeal Phase Impaired Pharyngeal- Pudding Teaspoon -- Pharyngeal -- Pharyngeal- Pudding Cup -- Pharyngeal -- Pharyngeal- Honey Teaspoon Reduced tongue base retraction;Reduced laryngeal elevation;Reduced anterior laryngeal mobility;Reduced epiglottic inversion;Delayed swallow initiation-pyriform sinuses;Penetration/Apiration after swallow;Pharyngeal residue - valleculae;Pharyngeal residue - pyriform;Pharyngeal  residue - posterior pharnyx;Penetration/Aspiration during swallow Pharyngeal -- Pharyngeal- Honey Cup -- Pharyngeal -- Pharyngeal- Nectar Teaspoon Moderate aspiration;Penetration/Aspiration before swallow;Reduced tongue base retraction;Reduced airway/laryngeal closure;Reduced laryngeal elevation;Reduced epiglottic inversion;Reduced anterior laryngeal mobility;Pharyngeal residue - valleculae;Pharyngeal residue - pyriform;Pharyngeal residue - posterior pharnyx Pharyngeal -- Pharyngeal- Nectar Cup -- Pharyngeal -- Pharyngeal- Nectar Straw -- Pharyngeal -- Pharyngeal- Thin Teaspoon -- Pharyngeal -- Pharyngeal- Thin Cup NT Pharyngeal -- Pharyngeal- Thin Straw -- Pharyngeal -- Pharyngeal- Puree Delayed swallow initiation-vallecula;Pharyngeal residue - valleculae;Pharyngeal residue - pyriform;Pharyngeal residue - posterior pharnyx Pharyngeal -- Pharyngeal- Mechanical Soft -- Pharyngeal -- Pharyngeal- Regular -- Pharyngeal -- Pharyngeal- Multi-consistency -- Pharyngeal -- Pharyngeal- Pill -- Pharyngeal -- Pharyngeal Comment --  CHL IP CERVICAL ESOPHAGEAL PHASE 11/21/2015 Cervical Esophageal Phase WFL Pudding Teaspoon -- Pudding Cup -- Honey Teaspoon -- Honey Cup -- Nectar Teaspoon -- Nectar Cup -- Nectar Straw -- Thin Teaspoon -- Thin Cup -- Thin Straw -- Puree -- Mechanical Soft -- Regular -- Multi-consistency -- Pill -- Cervical Esophageal Comment -- No flowsheet data found. Marcene Duos MA, CCC-SLP Acute Care Speech Language Pathologist  Kennieth Rad 11/21/2015, 12:49 PM               PHYSICAL EXAM CVP 10 personally checked.  General: Sitting up in chair. Using PM valvle Neck: JVP hard to see.  no thyromegaly or thyroid nodule.  Trach Lungs:R lung clear. Decreased breath sounds LLL CV: Nondisplaced PMI.  Heart regular S1/S2, no S3/S4, no murmur.  Abdomen: obese Nontender. Good BS.   Neurologic: alert and oriented x 3 Extremities: No clubbing or cyanosis.  R and LLE unna boot in place   TELEMETRY: NSR  60s  ASSESSMENT AND PLAN:  1. CAD: S/p late presentation inferior MI and CABG x 4. Continue statin, ASA. Coronary angiography 3/27 with multiple VT episodes showed patent LIMA-LAD and SVG-RCA.  Patent SVG-ramus and SVG-D but slow flow in both grafts with diffusely diseased small target vessels.  2. Cardiogenic shock: Acute systolic CHF with RV failure post inferior MI with suspected RV involvement. EF 25-30% on echo (difficult images on 3/24 echo). He has significant RV failure from suspected RV infarct.  3/27 IABP placed to try to allow decrease in milrinone with frequent VT.  3/31 echo with EF 30-35%.  IABP out 4/2.  Milrinone off 4/7 - CVP ~10 ok with RV failure. Continue torsemide 60 mg daily.    - Continue UNA boots. - No spiro yet with CKD. Can start low dose Coreg 3.125 mg bid.   - Continue sildenafil 40 tid.   - Continue hydralazine 25 mg tid for afterload reduction (  no Imdur with sildenafil use).  3. Complete heart block: Resolved.  4. VF arrest 3/24 early am and again at night 3/24.  Ectopy quiescent. - Continue po amio - Lifevest at discharge versus ICD => will discuss with EP, had VT 5-6 days after CABG.   5. AKI: Creatinine leveled off 1.6 6. ID: Possible LLL PNA.  He completed course of  vancomycin/levofloxacin.  7. Pulmonary: S/P Trach 4/5 on trach collar. Has PM valve. Failed swallow study again 4/15. Will repeat early this week, 8. Hypernatremia: Improving on D5W. Sodium 143.   Continue torsemide. CVP coming down.  9. Atrial flutter: Now in NSR on amio. He was off AC due to trach bleeding. On DVT prophylaxis. Will not start coumadin unless AF/AFL recurs. 10. Deconditioning: continue PT.   Tonye Becket NP-C- 11/23/2015 8:58 AM   Patient seen with NP, agree with the above note.  He is doing quite well, walking on unit.    Will add low dose Coreg 3.125 mg bid.   Continue current torsemide.   Swallow study on Wednesday.   CXR today, decreased breath sounds left base.     Possible inpatient rehab soon.   [name] 11/23/2015 10:01 AM

## 2015-11-23 NOTE — Progress Notes (Signed)
Occupational Therapy Treatment Patient Details Name: Christopher [name] MRN: [id number] DOB: [birth year] Today's Date: 11/23/2015    History of present illness [name] w/ ischemic cardiomyopathy following MI. Patient underwent CABG & CEA. Post-operative course complicated by arrhythmia and biventricular failure, HCAP, heart block, trach placement.  pt with hx of HTN, TIA, and R Carotid Stenosis.   OT comments  Pt now with PSMV in place. Less impulsive, but demonstrating impaired short term memory. Did not require cues for sternal precautions with sit<>stand today. Provided further education in energy conservation to wife an pt. Pt continues to be a good inpatient rehab candidate. Progressing steadily, eager to eat.  Follow Up Recommendations  CIR;Supervision/Assistance - 24 hour    Equipment Recommendations  3 in 1 bedside comode;Tub/shower seat    Recommendations for Other Services      Precautions / Restrictions Precautions Precautions: Sternal;Fall Precaution Comments: instructed pt and wife in rationale for sternal precautions, pt adhering during sit to stand without cues       Mobility Bed Mobility               General bed mobility comments: pt in chair  Transfers Overall transfer level: Needs assistance   Transfers: Sit to/from Stand Sit to Stand: Min guard         General transfer comment: from chair, steadying assist    Balance     Sitting balance-Leahy Scale: Good       Standing balance-Leahy Scale: Fair                     ADL Overall ADL's : Needs assistance/impaired Eating/Feeding: Set up;Sitting Eating/Feeding Details (indicate cue type and reason): ice chips Grooming: Wash/dry hands;Standing;Min guard           Upper Body Dressing : Minimal assistance;Sitting Upper Body Dressing Details (indicate cue type and reason): front opening gown     Toilet Transfer: Min guard           Functional mobility during ADLs: Min  guard (pushing w/c) General ADL Comments: Instructed pt and wife on energy conservation strategies including benefits of sitting to shower.      Vision                     Perception     Praxis      Cognition   Behavior During Therapy: WFL for tasks assessed/performed Overall Cognitive Status: Impaired/Different from baseline Area of Impairment: Memory     Memory: Decreased short-term memory               Extremity/Trunk Assessment               Exercises     Shoulder Instructions       General Comments      Pertinent Vitals/ Pain       Pain Assessment: No/denies pain  Home Living                                          Prior Functioning/Environment              Frequency Min 2X/week     Progress Toward Goals  OT Goals(current goals can now be found in the care plan section)  Progress towards OT goals: Progressing toward goals  Acute Rehab OT Goals 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

## 2015-11-23 NOTE — Progress Notes (Signed)
Physical Therapy Treatment Patient Details Name: Christopher [name] MRN: [id number] DOB: 25-Oct-1960 Today's Date: 11/23/2015    History of Present Illness [name] w/ ischemic cardiomyopathy following MI. Patient underwent CABG & CEA. Post-operative course complicated by arrhythmia and biventricular failure, HCAP, heart block, trach placement.  pt with hx of HTN, TIA, and R Carotid Stenosis.    PT Comments    Patient progressing with ambulation and endurance, though needed standing rest breaks and overall fatigued from multiple therapies as well as transfer to new unit.  Continue to feel he would benefit from inpatient rehab due to needing coordinated interdisciplinary care.  Will continue to follow acutely.  Follow Up Recommendations  CIR     Equipment Recommendations  None recommended by PT    Recommendations for Other Services       Precautions / Restrictions Precautions Precautions: Sternal;Fall    Mobility  Bed Mobility               General bed mobility comments: pt in chair  Transfers Overall transfer level: Needs assistance   Transfers: Sit to/from Stand Sit to Stand: Min guard         General transfer comment: assist and cues for precautions  Ambulation/Gait Ambulation/Gait assistance: Min assist Ambulation Distance (Feet): 300 Feet Assistive device: Rolling walker (2 wheeled) Gait Pattern/deviations: Step-through pattern;Decreased stride length;Trunk flexed     General Gait Details: slower pace this session and with some stops due to wheezing, RN aware to call for breathing tx   Stairs            Wheelchair Mobility    Modified Rankin (Stroke Patients Only)       Balance Overall balance assessment: Needs assistance   Sitting balance-Leahy Scale: Good     Standing balance support: Bilateral upper extremity supported Standing balance-Leahy Scale: Fair Standing balance comment: UE support for ambulation, static balance without  support                    Cognition Arousal/Alertness: Awake/alert Behavior During Therapy: WFL for tasks assessed/performed Overall Cognitive Status: Impaired/Different from baseline       Memory: Decreased short-term memory              Exercises      General Comments General comments (skin integrity, edema, etc.): family in the room and attentive, walked with [place]; pt just transferred from ICU to step down, well fatigued from multiple therapies today as well as transfer       Pertinent Vitals/Pain Pain Assessment: No/denies pain    Home Living                      Prior Function            PT Goals (current goals can now be found in the care plan section) Acute Rehab PT Goals Potential to Achieve Goals: Good Progress towards PT goals: Progressing toward goals    Frequency  Min 3X/week    PT Plan Current plan remains appropriate    Co-evaluation             End of Session Equipment Utilized During Treatment: Gait belt Activity Tolerance: Patient limited by fatigue Patient left: in chair;with call bell/phone within reach;with family/visitor present     Time: 9628-3662 PT Time Calculation (min) (ACUTE ONLY): 15 min  Charges:  $Gait Training: 8-22 mins  G CodesElray Mcgregor 11/23/2015, 5:31 PM  Sheran Lawless, PT [phone] 11/23/2015

## 2015-11-23 NOTE — Progress Notes (Signed)
21 Days Post-Op Procedure(s) (LRB): Right/Left Heart Cath and Coronary/Graft Angiography (N/A) Intra-Aortic Balloon Pump Insertion Subjective: Doing well off vent, airway secretions are improved Remains on TF with aspiration on last swallow study Doing well with po diuretic Objective: Vital signs in last 24 hours: Temp:  [97.7 F (36.5 C)-98.6 F (37 C)] 98.5 F (36.9 C) (04/17 1110) Pulse Rate:  [43-84] 76 (04/17 1600) Cardiac Rhythm:  [-] Normal sinus rhythm (04/17 1634) Resp:  [16-24] 19 (04/17 1600) BP: (91-131)/(59-91) 115/81 mmHg (04/17 1600) SpO2:  [92 %-100 %] 93 % (04/17 1600) FiO2 (%):  [28 %] 28 % (04/17 1600) Weight:  [229 lb 15 oz (104.3 kg)] 229 lb 15 oz (104.3 kg) (04/17 0400)  Hemodynamic parameters for last 24 hours: CVP:  [0 mmHg-11 mmHg] 8 mmHg  Intake/Output from previous day: 04/16 0701 - 04/17 0700 In: 3100 [I.V.:1150; NG/GT:1950] Out: 2950 [Urine:2950] Intake/Output this shift: Total I/O In: 790 [I.V.:90; NG/GT:700] Out: 1650 [Urine:1650]       Exam    General- alert and comfortable   Lungs- clear without rales, wheezes   Cor- regular rate and rhythm, no murmur , gallop   Abdomen- soft, non-tender   Extremities - warm, non-tender, minimal edema   Neuro- oriented, appropriate, no focal weakness   Lab Results:  Recent Labs  11/22/15 0534 11/23/15 0410  WBC 9.8 8.5  HGB 10.9* 10.8*  HCT 35.1* 35.4*  PLT 247 243   BMET:  Recent Labs  11/22/15 0534 11/23/15 0410  NA 145 143  K 4.8 4.2  CL 110 105  CO2 26 27  GLUCOSE 115* 101*  BUN 51* 49*  CREATININE 1.64* 1.62*  CALCIUM 8.7* 8.6*    PT/INR: No results for input(s): LABPROT, INR in the last 72 hours. ABG    Component Value Date/Time   PHART 7.496* 11/19/2015 1616   HCO3 24.0 11/19/2015 1616   TCO2 23 11/19/2015 1742   ACIDBASEDEF TEST WILL BE CREDITED 11/01/2015 0446   O2SAT 66.5 11/22/2015 0422   CBG (last 3)   Recent Labs  11/23/15 0712 11/23/15 1113  11/23/15 1554  GLUCAP 100* 107* 95    Assessment/Plan: S/P Procedure(s) (LRB): Right/Left Heart Cath and Coronary/Graft Angiography (N/A) Intra-Aortic Balloon Pump Insertion Mobilize Diuresis Plan for transfer to step-down: see transfer orders   LOS: 34 days    Kathlee Nations Trigt III 11/23/2015

## 2015-11-23 NOTE — Progress Notes (Signed)
Rehab admissions - I have not heard back from [facility] about potential inpatient rehab admission.  Social worker following as well in case SNF is needed.  I will update all when I hear back from [facility].  Call me for questions.  #[phone]

## 2015-11-24 ENCOUNTER — Inpatient Hospital Stay (HOSPITAL_COMMUNITY)
Admission: RE | Admit: 2015-11-24 | Discharge: 2015-12-01 | DRG: 945 | Disposition: A | Discharge status: Home / Self Care | Payer: BLUE CROSS/BLUE SHIELD | Source: Intra-hospital | Attending: Physical Medicine & Rehabilitation | Admitting: Physical Medicine & Rehabilitation

## 2015-11-24 ENCOUNTER — Inpatient Hospital Stay (HOSPITAL_COMMUNITY): Payer: BLUE CROSS/BLUE SHIELD

## 2015-11-24 ENCOUNTER — Ambulatory Visit: Payer: BLUE CROSS/BLUE SHIELD | Admitting: Vascular Surgery

## 2015-11-24 DIAGNOSIS — I110 Hypertensive heart disease with heart failure: Secondary | ICD-10-CM | POA: Diagnosis present

## 2015-11-24 DIAGNOSIS — Z87891 Personal history of nicotine dependence: Secondary | ICD-10-CM

## 2015-11-24 DIAGNOSIS — Z8673 Personal history of transient ischemic attack (TIA), and cerebral infarction without residual deficits: Secondary | ICD-10-CM

## 2015-11-24 DIAGNOSIS — J9611 Chronic respiratory failure with hypoxia: Secondary | ICD-10-CM | POA: Diagnosis present

## 2015-11-24 DIAGNOSIS — R1310 Dysphagia, unspecified: Secondary | ICD-10-CM | POA: Diagnosis present

## 2015-11-24 DIAGNOSIS — J9501 Hemorrhage from tracheostomy stoma: Secondary | ICD-10-CM | POA: Diagnosis not present

## 2015-11-24 DIAGNOSIS — K9189 Other postprocedural complications and disorders of digestive system: Secondary | ICD-10-CM

## 2015-11-24 DIAGNOSIS — Z930 Tracheostomy status: Secondary | ICD-10-CM | POA: Diagnosis not present

## 2015-11-24 DIAGNOSIS — G4733 Obstructive sleep apnea (adult) (pediatric): Secondary | ICD-10-CM | POA: Diagnosis present

## 2015-11-24 DIAGNOSIS — K7200 Acute and subacute hepatic failure without coma: Secondary | ICD-10-CM | POA: Diagnosis present

## 2015-11-24 DIAGNOSIS — I4892 Unspecified atrial flutter: Secondary | ICD-10-CM | POA: Diagnosis present

## 2015-11-24 DIAGNOSIS — T502X5A Adverse effect of carbonic-anhydrase inhibitors, benzothiadiazides and other diuretics, initial encounter: Secondary | ICD-10-CM | POA: Diagnosis present

## 2015-11-24 DIAGNOSIS — J449 Chronic obstructive pulmonary disease, unspecified: Secondary | ICD-10-CM | POA: Diagnosis present

## 2015-11-24 DIAGNOSIS — I2510 Atherosclerotic heart disease of native coronary artery without angina pectoris: Secondary | ICD-10-CM | POA: Diagnosis present

## 2015-11-24 DIAGNOSIS — I472 Ventricular tachycardia, unspecified: Secondary | ICD-10-CM

## 2015-11-24 DIAGNOSIS — I5041 Acute combined systolic (congestive) and diastolic (congestive) heart failure: Secondary | ICD-10-CM | POA: Diagnosis present

## 2015-11-24 DIAGNOSIS — E876 Hypokalemia: Secondary | ICD-10-CM | POA: Diagnosis not present

## 2015-11-24 DIAGNOSIS — Z8674 Personal history of sudden cardiac arrest: Secondary | ICD-10-CM

## 2015-11-24 DIAGNOSIS — N179 Acute kidney failure, unspecified: Secondary | ICD-10-CM | POA: Diagnosis present

## 2015-11-24 DIAGNOSIS — I252 Old myocardial infarction: Secondary | ICD-10-CM | POA: Diagnosis not present

## 2015-11-24 DIAGNOSIS — J90 Pleural effusion, not elsewhere classified: Secondary | ICD-10-CM

## 2015-11-24 DIAGNOSIS — R531 Weakness: Secondary | ICD-10-CM | POA: Diagnosis present

## 2015-11-24 DIAGNOSIS — R5381 Other malaise: Secondary | ICD-10-CM | POA: Diagnosis present

## 2015-11-24 DIAGNOSIS — K560 Paralytic ileus: Secondary | ICD-10-CM | POA: Diagnosis present

## 2015-11-24 DIAGNOSIS — Z951 Presence of aortocoronary bypass graft: Secondary | ICD-10-CM

## 2015-11-24 DIAGNOSIS — I255 Ischemic cardiomyopathy: Secondary | ICD-10-CM | POA: Diagnosis present

## 2015-11-24 DIAGNOSIS — K567 Ileus, unspecified: Secondary | ICD-10-CM

## 2015-11-24 DIAGNOSIS — I5043 Acute on chronic combined systolic (congestive) and diastolic (congestive) heart failure: Secondary | ICD-10-CM | POA: Diagnosis present

## 2015-11-24 DIAGNOSIS — J410 Simple chronic bronchitis: Secondary | ICD-10-CM | POA: Diagnosis not present

## 2015-11-24 DIAGNOSIS — J42 Unspecified chronic bronchitis: Secondary | ICD-10-CM

## 2015-11-24 LAB — BASIC METABOLIC PANEL
ANION GAP: 10 (ref 5–15)
BUN: 48 mg/dL — ABNORMAL HIGH (ref 6–20)
CO2: 28 mmol/L (ref 22–32)
Calcium: 8.7 mg/dL — ABNORMAL LOW (ref 8.9–10.3)
Chloride: 103 mmol/L (ref 101–111)
Creatinine, Ser: 1.51 mg/dL — ABNORMAL HIGH (ref 0.61–1.24)
GFR calc non Af Amer: 51 mL/min — ABNORMAL LOW (ref >60)
GFR, EST AFRICAN AMERICAN: 59 mL/min — AB (ref >60)
Glucose, Bld: 104 mg/dL — ABNORMAL HIGH (ref 65–99)
Potassium: 5 mmol/L (ref 3.5–5.1)
Sodium: 141 mmol/L (ref 135–145)

## 2015-11-24 LAB — CBC
HCT: 36 % — ABNORMAL LOW (ref 39.0–52.0)
Hemoglobin: 10.9 g/dL — ABNORMAL LOW (ref 13.0–17.0)
MCH: 27.3 pg (ref 26.0–34.0)
MCHC: 30.3 g/dL (ref 30.0–36.0)
MCV: 90 fL (ref 78.0–100.0)
Platelets: 226 10*3/uL (ref 150–400)
RBC: 4 MIL/uL — ABNORMAL LOW (ref 4.22–5.81)
RDW: 17.5 % — ABNORMAL HIGH (ref 11.5–15.5)
WBC: 8.6 10*3/uL (ref 4.0–10.5)

## 2015-11-24 LAB — GLUCOSE, CAPILLARY
GLUCOSE-CAPILLARY: 98 mg/dL (ref 65–99)
GLUCOSE-CAPILLARY: 90 mg/dL (ref 65–99)
GLUCOSE-CAPILLARY: 101 mg/dL — AB (ref 65–99)
Glucose-Capillary: 94 mg/dL (ref 65–99)

## 2015-11-24 MED ORDER — TRIAMCINOLONE ACETONIDE 0.5 % EX CREA
TOPICAL_CREAM | Freq: Two times a day (BID) | CUTANEOUS | Status: DC
  Administered 2015-11-24 – 2015-11-25 (×2): via TOPICAL

## 2015-11-24 MED ORDER — PROCHLORPERAZINE EDISYLATE 5 MG/ML IJ SOLN: 5.0000 mg | Freq: Four times a day (QID) | INTRAMUSCULAR | Status: DC | PRN

## 2015-11-24 MED ORDER — AMIODARONE HCL 200 MG PO TABS
200.0000 mg | ORAL_TABLET | Freq: Two times a day (BID) | ORAL | Status: DC
  Administered 2015-11-24 – 2015-11-26 (×5): 200 mg via ORAL
  Filled 2015-11-24 (×6): qty 1

## 2015-11-24 MED ORDER — SALINE SPRAY 0.65 % NA SOLN: 1.0000 | NASAL | Status: DC | PRN

## 2015-11-24 MED ORDER — ASPIRIN 81 MG PO CHEW
81.0000 mg | CHEWABLE_TABLET | Freq: Every day | ORAL | Status: DC
  Administered 2015-11-25 – 2015-12-01 (×7): 81 mg via ORAL
  Filled 2015-11-24 (×7): qty 1

## 2015-11-24 MED ORDER — QUETIAPINE FUMARATE 50 MG PO TABS
50.0000 mg | ORAL_TABLET | Freq: Every day | ORAL | Status: DC
  Administered 2015-11-24 – 2015-11-30 (×7): 50 mg via ORAL
  Filled 2015-11-24 (×7): qty 1

## 2015-11-24 MED ORDER — SODIUM CHLORIDE 0.9% FLUSH
10.0000 mL | INTRAVENOUS | Status: DC | PRN
Start: 2015-11-24 — End: 2015-11-30
  Administered 2015-11-25: 20 mL
  Filled 2015-11-24: qty 40

## 2015-11-24 MED ORDER — ATORVASTATIN CALCIUM 80 MG PO TABS: 80.0000 mg | ORAL_TABLET | Freq: Every day | ORAL | Status: DC

## 2015-11-24 MED ORDER — METOCLOPRAMIDE HCL 5 MG/ML IJ SOLN: 10.0000 mg | Freq: Four times a day (QID) | INTRAMUSCULAR | Status: DC

## 2015-11-24 MED ORDER — HYDRALAZINE HCL 25 MG PO TABS: 25.0000 mg | ORAL_TABLET | Freq: Three times a day (TID) | ORAL | Status: DC

## 2015-11-24 MED ORDER — FREE WATER: 200.0000 mL | Freq: Three times a day (TID) | Status: DC

## 2015-11-24 MED ORDER — FREE WATER
200.0000 mL | Freq: Four times a day (QID) | Status: DC
  Administered 2015-11-24: 200 mL

## 2015-11-24 MED ORDER — ENOXAPARIN SODIUM 40 MG/0.4ML ~~LOC~~ SOLN: 40.0000 mg | SUBCUTANEOUS | Status: DC

## 2015-11-24 MED ORDER — CHLORHEXIDINE GLUCONATE 0.12 % MT SOLN
15.0000 mL | Freq: Two times a day (BID) | OROMUCOSAL | Status: DC
  Administered 2015-11-25 – 2015-12-01 (×8): 15 mL via OROMUCOSAL
  Filled 2015-11-24 (×12): qty 15

## 2015-11-24 MED ORDER — AMIODARONE HCL 200 MG PO TABS: 200.0000 mg | ORAL_TABLET | Freq: Two times a day (BID) | ORAL | Status: DC

## 2015-11-24 MED ORDER — CETYLPYRIDINIUM CHLORIDE 0.05 % MT LIQD: 7.0000 mL | Freq: Two times a day (BID) | OROMUCOSAL | Status: DC

## 2015-11-24 MED ORDER — POTASSIUM CHLORIDE 20 MEQ/15ML (10%) PO SOLN
40.0000 meq | Freq: Three times a day (TID) | ORAL | Status: DC
Start: 2015-11-24 — End: 2015-12-01

## 2015-11-24 MED ORDER — JEVITY 1.2 CAL PO LIQD
1000.0000 mL | ORAL | Status: DC
  Filled 2015-11-24 (×2): qty 1000

## 2015-11-24 MED ORDER — HYDRALAZINE HCL 25 MG PO TABS
25.0000 mg | ORAL_TABLET | Freq: Three times a day (TID) | ORAL | Status: DC
  Administered 2015-11-24 – 2015-12-01 (×20): 25 mg via ORAL
  Filled 2015-11-24 (×22): qty 1

## 2015-11-24 MED ORDER — PRO-STAT SUGAR FREE PO LIQD
30.0000 mL | Freq: Three times a day (TID) | ORAL | Status: DC
  Administered 2015-11-24 – 2015-11-25 (×2): 30 mL
  Filled 2015-11-24 (×3): qty 30

## 2015-11-24 MED ORDER — OXYCODONE-ACETAMINOPHEN 5-325 MG PO TABS: 1.0000 | ORAL_TABLET | ORAL | Status: DC | PRN

## 2015-11-24 MED ORDER — METOCLOPRAMIDE HCL 5 MG/ML IJ SOLN
10.0000 mg | Freq: Four times a day (QID) | INTRAMUSCULAR | Status: DC
Start: 2015-11-24 — End: 2015-11-24
  Administered 2015-11-24: 10 mg via INTRAVENOUS
  Filled 2015-11-24: qty 2

## 2015-11-24 MED ORDER — ACETAMINOPHEN 325 MG PO TABS: 325.0000 mg | ORAL_TABLET | ORAL | Status: DC | PRN

## 2015-11-24 MED ORDER — TORSEMIDE 20 MG PO TABS: 80.0000 mg | ORAL_TABLET | Freq: Every day | ORAL | Status: DC

## 2015-11-24 MED ORDER — ALUM & MAG HYDROXIDE-SIMETH 200-200-20 MG/5ML PO SUSP: 30.0000 mL | ORAL | Status: DC | PRN

## 2015-11-24 MED ORDER — ATORVASTATIN CALCIUM 80 MG PO TABS
80.0000 mg | ORAL_TABLET | Freq: Every day | ORAL | Status: DC
  Administered 2015-11-24 – 2015-11-30 (×7): 80 mg via ORAL
  Filled 2015-11-24 (×2): qty 1
  Filled 2015-11-24: qty 2
  Filled 2015-11-24 (×4): qty 1

## 2015-11-24 MED ORDER — TRIAMCINOLONE ACETONIDE 0.5 % EX CREA: TOPICAL_CREAM | Freq: Two times a day (BID) | CUTANEOUS | Status: DC

## 2015-11-24 MED ORDER — QUETIAPINE FUMARATE 50 MG PO TABS: 50.0000 mg | ORAL_TABLET | Freq: Every day | ORAL | Status: DC

## 2015-11-24 MED ORDER — GUAIFENESIN-DM 100-10 MG/5ML PO SYRP
5.0000 mL | ORAL_SOLUTION | Freq: Four times a day (QID) | ORAL | Status: DC | PRN
  Administered 2015-11-29: 10 mL
  Filled 2015-11-24: qty 10

## 2015-11-24 MED ORDER — PROCHLORPERAZINE MALEATE 5 MG PO TABS: 5.0000 mg | ORAL_TABLET | Freq: Four times a day (QID) | ORAL | Status: DC | PRN

## 2015-11-24 MED ORDER — LEVALBUTEROL HCL 1.25 MG/0.5ML IN NEBU
1.2500 mg | INHALATION_SOLUTION | Freq: Three times a day (TID) | RESPIRATORY_TRACT | Status: DC
  Administered 2015-11-24 – 2015-11-26 (×7): 1.25 mg via RESPIRATORY_TRACT
  Filled 2015-11-24 (×9): qty 0.5

## 2015-11-24 MED ORDER — SENNOSIDES-DOCUSATE SODIUM 8.6-50 MG PO TABS: 1.0000 | ORAL_TABLET | Freq: Every evening | ORAL | Status: DC | PRN

## 2015-11-24 MED ORDER — SILDENAFIL CITRATE 20 MG PO TABS: 40.0000 mg | ORAL_TABLET | Freq: Three times a day (TID) | ORAL | Status: DC

## 2015-11-24 MED ORDER — SILVER SULFADIAZINE 1 % EX CREA: TOPICAL_CREAM | Freq: Two times a day (BID) | CUTANEOUS | Status: DC

## 2015-11-24 MED ORDER — ENOXAPARIN SODIUM 40 MG/0.4ML ~~LOC~~ SOLN
40.0000 mg | SUBCUTANEOUS | Status: DC
  Administered 2015-11-25 – 2015-11-30 (×6): 40 mg via SUBCUTANEOUS
  Filled 2015-11-24 (×7): qty 0.4

## 2015-11-24 MED ORDER — PRO-STAT SUGAR FREE PO LIQD: 30.0000 mL | Freq: Three times a day (TID) | ORAL | Status: DC

## 2015-11-24 MED ORDER — JEVITY 1.2 CAL PO LIQD
1000.0000 mL | ORAL | Status: DC
  Filled 2015-11-24 (×3): qty 1000

## 2015-11-24 MED ORDER — CHLORHEXIDINE GLUCONATE 0.12 % MT SOLN: 15.0000 mL | Freq: Two times a day (BID) | OROMUCOSAL | Status: DC

## 2015-11-24 MED ORDER — SALINE SPRAY 0.65 % NA SOLN
1.0000 | NASAL | Status: DC | PRN
  Filled 2015-11-24: qty 44

## 2015-11-24 MED ORDER — TORSEMIDE 20 MG PO TABS: 60.0000 mg | ORAL_TABLET | Freq: Every day | ORAL | Status: DC

## 2015-11-24 MED ORDER — POTASSIUM CHLORIDE 20 MEQ/15ML (10%) PO SOLN
40.0000 meq | Freq: Every day | ORAL | Status: DC
  Administered 2015-11-25 – 2015-11-30 (×6): 40 meq via ORAL
  Filled 2015-11-24 (×8): qty 30

## 2015-11-24 MED ORDER — ONDANSETRON HCL 4 MG/2ML IJ SOLN: 4.0000 mg | Freq: Four times a day (QID) | INTRAMUSCULAR | Status: DC | PRN

## 2015-11-24 MED ORDER — BISACODYL 10 MG RE SUPP: 10.0000 mg | Freq: Every day | RECTAL | Status: DC | PRN

## 2015-11-24 MED ORDER — DIPHENHYDRAMINE HCL 50 MG/ML IJ SOLN: 25.0000 mg | Freq: Three times a day (TID) | INTRAMUSCULAR | Status: DC | PRN

## 2015-11-24 MED ORDER — PANTOPRAZOLE SODIUM 40 MG PO PACK
40.0000 mg | PACK | Freq: Every day | ORAL | Status: DC
  Administered 2015-11-25 – 2015-11-30 (×5): 40 mg
  Filled 2015-11-24 (×8): qty 20

## 2015-11-24 MED ORDER — TRAMADOL HCL 50 MG PO TABS: 50.0000 mg | ORAL_TABLET | ORAL | Status: DC | PRN

## 2015-11-24 MED ORDER — POTASSIUM CHLORIDE 20 MEQ/15ML (10%) PO SOLN: 40.0000 meq | Freq: Three times a day (TID) | ORAL | Status: DC

## 2015-11-24 MED ORDER — SILVER SULFADIAZINE 1 % EX CREA
TOPICAL_CREAM | Freq: Two times a day (BID) | CUTANEOUS | Status: DC
  Administered 2015-11-25 – 2015-11-27 (×3): via TOPICAL
  Filled 2015-11-24: qty 85

## 2015-11-24 MED ORDER — PROCHLORPERAZINE 25 MG RE SUPP: 12.5000 mg | Freq: Four times a day (QID) | RECTAL | Status: DC | PRN

## 2015-11-24 MED ORDER — INSULIN ASPART 100 UNIT/ML ~~LOC~~ SOLN: 0.0000 [IU] | SUBCUTANEOUS | Status: DC

## 2015-11-24 MED ORDER — SILDENAFIL CITRATE 20 MG PO TABS
40.0000 mg | ORAL_TABLET | Freq: Three times a day (TID) | ORAL | Status: DC
  Administered 2015-11-24 – 2015-12-01 (×21): 40 mg via ORAL
  Filled 2015-11-24 (×23): qty 2

## 2015-11-24 MED ORDER — FLEET ENEMA 7-19 GM/118ML RE ENEM: 1.0000 | ENEMA | Freq: Once | RECTAL | Status: DC | PRN

## 2015-11-24 MED ORDER — CETYLPYRIDINIUM CHLORIDE 0.05 % MT LIQD
7.0000 mL | Freq: Two times a day (BID) | OROMUCOSAL | Status: DC
  Administered 2015-11-26 – 2015-11-30 (×4): 7 mL via OROMUCOSAL

## 2015-11-24 MED ORDER — METOCLOPRAMIDE HCL 5 MG/ML IJ SOLN
10.0000 mg | Freq: Four times a day (QID) | INTRAMUSCULAR | Status: DC
  Administered 2015-11-25 (×2): 10 mg via INTRAVENOUS
  Filled 2015-11-24 (×2): qty 2

## 2015-11-24 MED ORDER — OXYCODONE HCL 5 MG PO TABS: 5.0000 mg | ORAL_TABLET | ORAL | Status: DC | PRN

## 2015-11-24 MED ORDER — TRAZODONE HCL 50 MG PO TABS: 25.0000 mg | ORAL_TABLET | Freq: Every evening | ORAL | Status: DC | PRN

## 2015-11-24 MED ORDER — LEVALBUTEROL HCL 1.25 MG/0.5ML IN NEBU: 1.2500 mg | INHALATION_SOLUTION | Freq: Three times a day (TID) | RESPIRATORY_TRACT | Status: DC

## 2015-11-24 MED ORDER — ASPIRIN 81 MG PO TABS: 325.0000 mg | ORAL_TABLET | Freq: Every day | ORAL | Status: DC

## 2015-11-24 MED ORDER — PANTOPRAZOLE SODIUM 40 MG PO PACK: 40.0000 mg | PACK | Freq: Every day | ORAL | Status: DC

## 2015-11-24 MED ORDER — JEVITY 1.2 CAL PO LIQD: 1000.0000 mL | ORAL | Status: DC

## 2015-11-24 NOTE — Interval H&P Note (Signed)
Christopher [name] was admitted today to Inpatient Rehabilitation with the diagnosis of debility.  The patient's history has been reviewed, patient examined, and there is no change in status.  Patient continues to be appropriate for intensive inpatient rehabilitation.  I have reviewed the patient's chart and labs.  Questions were answered to the patient's satisfaction. The PAPE has been reviewed and assessment remains appropriate.  Christopher [name] T 11/24/2015, 6:14 PM

## 2015-11-24 NOTE — Progress Notes (Signed)
Rehab admissions - I have approval for acute inpatient rehab admission.  I spoke with Dr. Donata Clay and I have medical clearance for acute inpatient rehab admission for today.  Bed available and will admit to inpatient rehab today.  Call me for questions.  #[phone]

## 2015-11-24 NOTE — Clinical Social Work Note (Signed)
Patient being admitted into IP REHAB. No longer requires SNF as alternative plan.  Clinical Social Worker will sign off for now as social work intervention is no longer needed. Please consult [place] again if new need arises.  Derenda Fennel, MSW, LCSWA [phone] 11/24/2015 4:46 PM

## 2015-11-24 NOTE — Discharge Summary (Signed)
Physician Discharge Summary  Patient ID: Christopher [name] MRN: [id number] DOB/AGE: May 27, 1961 55 y.o.  Admit date: 10/20/2015 Discharge date: 11/24/2015  Admission Diagnoses:  Patient Active Problem List   Diagnosis Date Noted  . CAD (coronary artery disease)   . Hypertensive heart disease with heart failure (HCC)   . Carotid artery stenosis   . Acute combined systolic and diastolic heart failure (HCC) 10/20/2015  . Complete heart block (HCC) 10/20/2015  . Acute MI, inferior wall, initial episode of care (HCC) 10/20/2015  . TIA (transient ischemic attack) 12/25/2014  . Essential hypertension 12/25/2014  . Hyperlipidemia 12/25/2014   Discharge Diagnoses:   Patient Active Problem List   Diagnosis Date Noted  . VT (ventricular tachycardia) (HCC)   . Ileus (HCC)   . Hyperosmolality and/or hypernatremia   . Acute respiratory failure with hypoxia (HCC)   . Chronic obstructive pulmonary disease (HCC)   . Acute renal failure (HCC)   . Ventilator dependence (HCC)   . Tracheostomy status (HCC)   . S/P CABG x 4 10/27/2015  . CAD (coronary artery disease)   . Hypertensive heart disease with heart failure (HCC)   . Carotid artery stenosis   . Acute combined systolic and diastolic heart failure (HCC) 10/20/2015  . Complete heart block (HCC) 10/20/2015  . Acute MI, inferior wall, initial episode of care (HCC) 10/20/2015  . TIA (transient ischemic attack) 12/25/2014  . Essential hypertension 12/25/2014  . Hyperlipidemia 12/25/2014   Discharged Condition: good  History of Present Illness:  Christopher [name] is a 55 yo obese white male with PMH of HTN.  He presented to [facility] on 10/20/2015 with complaints of shortness of breath.  He states several days ago he developed a rash, chest and back pain with diaphoresis.  It was felt this was a drug reaction and he was treated with Prednisone.  He has also been experiencing "heartburn" over the past week which caused the patient to feel poorly with  associated symptoms of a "flulike" illness.  Workup in the ED showed EKG changes consistent with an inferior STEMI and complete heart block.  Code STEMI was activated and the patient was transferred to [facility] for emergent cardiac catheterization.  Hospital Course:   Upon arrival patient states he felt fine. He admitted to use of Marijuana but denied alcohol and nicotine abuse which he quit approximately 10 years ago.  He was taken to the cath lab which showed total occlusion of the RCA with collaterals, critical stenosis of LAD.  He was also noted to have markedly elevated LVEDP and acute heart failure.  It was felt he should be taken to the cardiac care unit for aggressive diuresis.  He was placed on IV heparin and was chest pain free post catheterization.  He underwent cardiac MRI to assess for viability.  However, the patient was unable to tolerate procedure.  After further review of the cardiac catheterization with several cardiologist it was felt coronary bypass grafting would be indicated and TCTS consult was obtained.    He was evaluated by Dr. Donata Clay who felt CABG was the patient best treatment option for long term outcome.  He felt patient should be continue to undergo treatment for CHF and that surgery could be scheduled once the patients overall medical status improved.  Cardiac surgery workup revealed Carotid stenosis and vascular surgery consult was obtained.  The patient was evaluated by Dr. Arbie Cookey who felt the patient would require Right sided endarterectomy.  The patient was agreeable  to proceed and this was scheduled to occur the same time as his CABG procedure.    The patient remained medically stable prior to CABG.  He was aggressively diuresed with IV Lasix.  He was taken to the operating room on 10/27/2015.  He underwent right CEA and a CABG x 4 utilizing LIMA to LAD, SVG to Diagonal, SVG to Ramus Intermediate, and SVG to RCA.  He also underwent endoscopic harvest of  greater saphenous vein from his right leg.  He required placement of an IABP and multiple pressors to come off pump. He tolerated the procedure and was taken to the SICU in critical, but stable condition.  During his stay in the SICU the patient continued to have CHB requiring temporary pacing.  He was weaned and extubated on POD #1.  His IABP was removed on POD #2.  The patient developed V. Fib arrest early in the morning of POD #3.  He require defibrillation x 4 and he was emergently re-intubated.  He developed Atrial Fibrillation under the pacer and was on Amiodarone drip.  He remained hypervolemic and was in heart failure.  Advance heart failure team was consulted and took over diuretic and management of Milrinone and Norepinephrine drip.  ECHO was obtained and showed a reduce EF with RV failure.  EF was 25-30%  POD #5 patient developed sustained V Tach requiring cardioversion x 8.  He was fully sedated on vent and started on Procainamide per Cardiology.  POD #6 he was taken back to the cath lab which led to placement of an IABP.  All vein grafts were patent.  He continued to have underlying heart block and EP consult was obtained.  They did not feel ICD/PPM was indicated at that time as they were hopeful underlying HB and arrythmia would resolve.  POD #7 he underwent placement of a feeding tube and tube feeds were started.  The patient was unable to be weaned from vent and critical care consult was obtained.  They placed the patient on empiric Fortaz due to elevated procalcitonin level.  His oxygenation level decreased, requiring placement of Nitric Oxide to help with oxygenation.  His creatinine level increased to above 3 and his lasix regimen was decreased.  He was on broad spectrum antibiotic coverage and developed a wide spread rash.  His fortaz was discontinued and he was placed on Levaquin.  He was weaned off drips as tolerated.  His IABP was removed on POD #13.  The patient's creatinine continued to rise  and Neprhology consult was obtained.  They felt patient had good urinary output however, the were unable to rule out the possibility of needing dialysis should the renal function continue to rise.  The patient remained hemodynamically stable off IABP.  He converted to Atrial Fibrillation and was treated with Amiodarone.  He continued to be difficult to wean off the vent which ultimately led to placement of a bedside Tracheostomy performed on POD #16.  He converted to NSR.  His urinary output continued to improve and he was taken off Lasix.  He was started on trach collar trials as tolerated.  He continued to have some episodes of Atrial Fibrillation.  He was treated with IV Amiodarone with ultimate conversion to NSR.  He was restarted on IV Heparin for possible cardioversion.  He developed nausea with tube feedings.  ABD film confirmed the presence of an Ileus.  Swallow study was obtained and the patient did not pass, therefore he remained on tube feedings.  He was hypernatremic and his free water intake was increased.  He was restarted on Lasix as his volume status required.  His chest tubes and arterial lines were removed.  He was off all pressor support.  He was getting out of bed and into chair and was felt medically stable for transfer to the step down unit on POD #34.  The patient continued to make slow progress.  He was maintaining NSR.  Dr. Donata Clay pulled his pacing wires today 04/18. He was followed by SLP who advanced his diet as tolerated.  He is currently NPO.  He will need to have a Modified Barium Swallow tomorrow at 11/25/2015.  He has been participating with PT who recommends inpatient rehab placement.  We will continue trach at current size.  He is not to have his trach downsized at this time due to thick secretions.  He is medically stable for discharge to rehab today.                                         Consults: cardiology, pulmonary/intensive care, rehabilitation medicine, vascular  surgery and Nephrology  Significant Diagnostic Studies:  Cardiac catheterization:   Ramus lesion, 70% stenosed.  Prox LAD lesion, 95% stenosed.  Prox RCA lesion, 100% stenosed.  There is moderate left ventricular systolic dysfunction.  1. Total occlusion of the RCA with left-to-right collaterals, suspected culprit for late presenting inferior MI 2. Critical diffuse stenosis of the LAD and severe diagonal stenosis 3. Moderate stenosis of the ramus intermedius and no significant disease of the left circumflex 4. Moderate segmental LV systolic dysfunction with inferior wall akinesis  Post surgery catheterization:  1. Grafts patent, but SVG to diagonal and SVG to ramus were both large grafts to relatively small and diffusely diseased arteries. 2. Moderately elevated left and markedly elevated right heart filling pressures. Cardiac output adequate but on milrinone 0.5 mcg/kg/min.  3. IABP placed, set 1:1.  ECHO:  Left ventricle: Akinesis of the anteroseptal, inferoseptal and  inferior walls. The cavity size was normal. Systolic function was  moderately to severely reduced. The estimated ejection fraction  was in the range of 30% to 35%. Diffuse hypokinesis. Features are  consistent with a pseudonormal left ventricular filling pattern,  with concomitant abnormal relaxation and increased filling  pressure (grade 2 diastolic dysfunction). Doppler parameters are  consistent with elevated ventricular end-diastolic filling  pressure. - Ventricular septum: Septal motion showed paradox. - Aortic valve: There was no regurgitation. - Aortic root: The aortic root was normal in size. - Mitral valve: There was no regurgitation. - Left atrium: The atrium was mildly dilated. - Right ventricle: Systolic function was normal. - Right atrium: The atrium was normal in size. - Tricuspid valve: There was mild regurgitation. - Pulmonic valve: There was no regurgitation. - Pulmonary  arteries: Systolic pressure was within the normal  range. - Inferior vena cava: The vessel was normal in size. - Pericardium, extracardiac: There was no pericardial effusion  Treatments: surgery:   1. Coronary artery bypass grafting x4 (left internal mammary artery  left anterior descending coronary artery, saphenous vein graft to  diagonal, saphenous vein graft to ramus intermediate, saphenous  vein graft to right coronary artery). 2. Endoscopic harvest of right leg greater saphenous vein. 3. Placement of intra-aortic balloon pump. 4. Placement of right femoral A-line.  1. right carotid endarterectomy with bovine patch angioplasty 2. right  intraoperative carotid ultrasound  BRONCHOSCOPY with placement of Tracheostomy  Disposition: CIR  Discharge Medications:    Medication List    STOP taking these medications        lisinopril 20 MG tablet  Commonly known as:  PRINIVIL,ZESTRIL     lovastatin 20 MG tablet  Commonly known as:  MEVACOR     predniSONE 10 MG tablet  Commonly known as:  DELTASONE     PROAIR HFA 108 (90 Base) MCG/ACT inhaler  Generic drug:  albuterol      TAKE these medications        amiodarone 200 MG tablet  Commonly known as:  PACERONE  Take 1 tablet (200 mg total) by mouth 2 (two) times daily.     antiseptic oral rinse 0.05 % Liqd solution  Commonly known as:  CPC / CETYLPYRIDINIUM CHLORIDE 0.05%  7 mLs by Mouth Rinse route 2 times daily at 12 noon and 4 pm.     aspirin 81 MG tablet  Take 4 tablets (325 mg total) by mouth daily.     atorvastatin 80 MG tablet  Commonly known as:  LIPITOR  Take 1 tablet (80 mg total) by mouth daily at 6 PM.     chlorhexidine 0.12 % solution  Commonly known as:  PERIDEX  15 mLs by Mouth Rinse route 2 (two) times daily.     enoxaparin 40 MG/0.4ML injection  Commonly known as:  LOVENOX  Inject 0.4 mLs (40 mg total) into the skin daily.     feeding supplement (JEVITY 1.2 CAL) Liqd  Place 1,000  mLs into feeding tube continuous.     feeding supplement (PRO-STAT SUGAR FREE 64) Liqd  Place 30 mLs into feeding tube 3 (three) times daily.     free water Soln  Place 200 mLs into feeding tube every 8 (eight) hours.     hydrALAZINE 25 MG tablet  Commonly known as:  APRESOLINE  Take 1 tablet (25 mg total) by mouth every 8 (eight) hours.     levalbuterol 1.25 MG/0.5ML nebulizer solution  Commonly known as:  XOPENEX  Take 1.25 mg by nebulization 3 (three) times daily.     metoCLOPramide 5 MG/ML injection  Commonly known as:  REGLAN  Inject 2 mLs (10 mg total) into the vein every 6 (six) hours.     ondansetron 4 MG/2ML Soln injection  Commonly known as:  ZOFRAN  Inject 2 mLs (4 mg total) into the vein every 6 (six) hours as needed for nausea or vomiting.     pantoprazole sodium 40 mg/20 mL Pack  Commonly known as:  PROTONIX  Place 20 mLs (40 mg total) into feeding tube daily.     potassium chloride 20 MEQ/15ML (10%) Soln  Take 30 mLs (40 mEq total) by mouth every 8 (eight) hours.     QUEtiapine 50 MG tablet  Commonly known as:  SEROQUEL  Take 1 tablet (50 mg total) by mouth at bedtime.     sildenafil 20 MG tablet  Commonly known as:  REVATIO  Take 2 tablets (40 mg total) by mouth 3 (three) times daily.     silver sulfADIAZINE 1 % cream  Commonly known as:  SILVADENE  Apply topically 2 (two) times daily.     sodium chloride 0.65 % Soln nasal spray  Commonly known as:  OCEAN  Place 1 spray into both nostrils as needed for congestion.     torsemide 20 MG tablet  Commonly known as:  DEMADEX  Take  3 tablets (60 mg total) by mouth daily.     triamcinolone cream 0.5 %  Commonly known as:  KENALOG  Apply topically 2 (two) times daily.      The patient has been discharged on:   1.Beta Blocker:  Yes [   ]                              No   [ x  ]                              If No, reason: CHB  2.Ace Inhibitor/ARB: Yes [   ]                                     No  [   x  ]                                     If No, reason: Elevated creatinine  3.Statin:   Yes [  x ]                  No  [   ]                  If No, reason:  4.Ecasa:  Yes  [ x ]                  No   [   ]                  If No, reason:   Follow-up Information    Follow up with Mikey Bussing, MD On 12/30/2015.   Specialty:  Cardiothoracic Surgery   Why:  Appointment is at 11:00   Contact information:   [address] [place] [address] [phone]       Follow up with [facility] On 12/30/2015.   Why:  Please get CXR at 10:30   Contact information:   [facility]       Signed: Ardelle Balls 11/24/2015, 3:08 PM

## 2015-11-24 NOTE — Progress Notes (Signed)
Speech Language Pathology Treatment: Dysphagia;Passy Muir Speaking valve  Patient Details Name: Christopher [name] MRN: [id number] DOB: 28-Nov-1960 Today's Date: 11/24/2015 Time: 4098-1191 SLP Time Calculation (min) (ACUTE ONLY): 12 min  Assessment / Plan / Recommendation Clinical Impression  Pt says he is doing his swallowing exercises throughout the day, and performs them for SLP independently. Advanced trials of water provided with wet vocal quality after initial sip, which cleared with cued throat clear. Delayed cough observed x1 following the rest of his intake. Pt is very eager to participate in repeat MBS to determine diet readiness - will plan for this on next date.   HPI HPI: 55 y.o. male w/ ischemic cardiomyopathy following MI s/p CABG & CEA. Post-operative course complicated by arrhythmia and biventricular failure, HCAP, heart block. PMHx HTN, stenosis of R carotid artery, TIA. Intubated 3/21-3/22, re-intubated 3/26 and failed to wean, trach placed 4/6.      SLP Plan  MBS     Recommendations  Diet recommendations: NPO Medication Administration: Via alternative means      Patient may use Passy-Muir Speech Valve: During all waking hours (remove during sleep) PMSV Supervision: Intermittent MD: Please consider changing trach tube to : Smaller size;Cuffless      Oral Care Recommendations: Oral care QID;Oral care prior to ice chip/H20 Follow up Recommendations: Inpatient Rehab Plan: MBS     GO               [name], M.A. CCC-SLP [phone]  [name] 11/24/2015, 1:23 PM

## 2015-11-24 NOTE — Clinical Social Work Placement (Signed)
   CLINICAL SOCIAL WORK PLACEMENT  NOTE  Date:  11/24/2015  Patient Details  Name: Christopher [name] MRN: [id number] Date of Birth: Aug 01, 1961  Clinical Social Work is seeking post-discharge placement for this patient at the Skilled  Nursing Facility level of care (*CSW will initial, date and re-position this form in  chart as items are completed):  Yes   Patient/family provided with [facility]'s list of facilities offering this level of care within the geographic area requested by the patient (or if unable, by the patient's family).  Yes   Patient/family informed of their freedom to choose among providers that offer the needed level of care, that participate in Medicare, Medicaid or managed care program needed by the patient, have an available bed and are willing to accept the patient.  Yes   Patient/family informed of [facility]'s ownership interest in [facility] and [facility], as well as of the fact that they are under no obligation to receive care at these facilities.  PASRR submitted to EDS on       PASRR number received on       Existing PASRR number confirmed on       FL2 transmitted to all facilities in geographic area requested by pt/family on 11/24/15     FL2 transmitted to all facilities within larger geographic area on       Patient informed that his/her managed care company has contracts with or will negotiate with certain facilities, including the following:            Patient/family informed of bed offers received.  Patient chooses bed at       Physician recommends and patient chooses bed at      Patient to be transferred to   on  .  Patient to be transferred to facility by       Patient family notified on   of transfer.  Name of family member notified:        PHYSICIAN Please sign FL2, Please prepare priority discharge summary, including medications     Additional Comment:     _______________________________________________ Orson Gear, Student-SW 11/24/2015, 1:58 PM

## 2015-11-24 NOTE — Discharge Instructions (Signed)
Coronary Artery Bypass Grafting, Care After °Refer to this sheet in the next few weeks. These instructions provide you with information on caring for yourself after your procedure. Your health care provider may also give you more specific instructions. Your treatment has been planned according to current medical practices, but problems sometimes occur. Call your health care provider if you have any problems or questions after your procedure. °WHAT TO EXPECT AFTER THE PROCEDURE °Recovery from surgery will be different for everyone. Some people feel well after 3 or 4 weeks, while for others it takes longer. After your procedure, it is typical to have the following: °· Nausea and a lack of appetite.   °· Constipation. °· Weakness and fatigue.   °· Depression or irritability.   °· Pain or discomfort at your incision site. °HOME CARE INSTRUCTIONS °· Take medicines only as directed by your health care provider. Do not stop taking medicines or start any new medicines without first checking with your health care provider. °· Take your pulse as directed by your health care provider. °· Perform deep breathing as directed by your health care provider. If you were given a device called an incentive spirometer, use it to practice deep breathing several times a day. Support your chest with a pillow or your arms when you take deep breaths or cough. °· Keep incision areas clean, dry, and protected. Remove or change any bandages (dressings) only as directed by your health care provider. You may have skin adhesive strips over the incision areas. Do not take the strips off. They will fall off on their own. °· Check incision areas daily for any swelling, redness, or drainage. °· If incisions were made in your legs, do the following: °¨ Avoid crossing your legs.   °¨ Avoid sitting for long periods of time. Change positions every 30 minutes.   °¨ Elevate your legs when you are sitting. °· Wear compression stockings as directed by your  health care provider. These stockings help keep blood clots from forming in your legs. °· Take showers once your health care provider approves. Until then, only take sponge baths. Pat incisions dry. Do not rub incisions with a washcloth or towel. Do not take baths, swim, or use a hot tub until your health care provider approves. °· Eat foods that are high in fiber, such as raw fruits and vegetables, whole grains, beans, and nuts. Meats should be lean cut. Avoid canned, processed, and fried foods. °· Drink enough fluid to keep your urine clear or pale yellow. °· Weigh yourself every day. This helps identify if you are retaining fluid that may make your heart and lungs work harder. °· Rest and limit activity as directed by your health care provider. You may be instructed to: °¨ Stop any activity at once if you have chest pain, shortness of breath, irregular heartbeats, or dizziness. Get help right away if you have any of these symptoms. °¨ Move around frequently for short periods or take short walks as directed by your health care provider. Increase your activities gradually. You may need physical therapy or cardiac rehabilitation to help strengthen your muscles and build your endurance. °¨ Avoid lifting, pushing, or pulling anything heavier than 10 lb (4.5 kg) for at least 6 weeks after surgery. °· Do not drive until your health care provider approves.  °· Ask your health care provider when you may return to work. °· Ask your health care provider when you may resume sexual activity. °· Keep all follow-up visits as directed by your health care   provider. This is important. °SEEK MEDICAL CARE IF: °· You have swelling, redness, increasing pain, or drainage at the site of an incision. °· You have a fever. °· You have swelling in your ankles or legs. °· You have pain in your legs.   °· You gain 2 or more pounds (0.9 kg) a day. °· You are nauseous or vomit. °· You have diarrhea.  °SEEK IMMEDIATE MEDICAL CARE IF: °· You have  chest pain that goes to your jaw or arms. °· You have shortness of breath.   °· You have a fast or irregular heartbeat.   °· You notice a "clicking" in your breastbone (sternum) when you move.   °· You have numbness or weakness in your arms or legs. °· You feel dizzy or light-headed.   °MAKE SURE YOU: °· Understand these instructions. °· Will watch your condition. °· Will get help right away if you are not doing well or get worse. °  °This information is not intended to replace advice given to you by your health care provider. Make sure you discuss any questions you have with your health care provider. °  °Document Released: 02/11/2005 Document Revised: 08/15/2014 Document Reviewed: 01/01/2013 °[employer]vier Interactive Patient Education ©2016 [employer] ° °Endoscopic Saphenous Vein Harvesting, Care After °Refer to this sheet in the next few weeks. These instructions provide you with information on caring for yourself after your procedure. Your health care provider may also give you more specific instructions. Your treatment has been planned according to current medical practices, but problems sometimes occur. Call your health care provider if you have any problems or questions after your procedure. °HOME CARE INSTRUCTIONS °Medicine °· Take whatever pain medicine your surgeon prescribes. Follow the directions carefully. Do not take over-the-counter pain medicine unless your surgeon says it is okay. Some pain medicine can cause bleeding problems for several weeks after surgery. °· Follow your surgeon's instructions about driving. You will probably not be permitted to drive after heart surgery. °· Take any medicines your surgeon prescribes. Any medicines you took before your heart surgery should be checked with your health care provider before you start taking them again. °Wound care °· If your surgeon has prescribed an elastic bandage or stocking, ask how long you should wear it. °· Check the area around your surgical  cuts (incisions) whenever your bandages (dressings) are changed. Look for any redness or swelling. °· You will need to return to have the stitches (sutures) or staples taken out. Ask your surgeon when to do that. °· Ask your surgeon when you can shower or bathe. °Activity °· Try to keep your legs raised when you are sitting. °· Do any exercises your health care providers have given you. These may include deep breathing exercises, coughing, walking, or other exercises. °SEEK MEDICAL CARE IF: °· You have any questions about your medicines. °· You have more leg pain, especially if your pain medicine stops working. °· New or growing bruises develop on your leg. °· Your leg swells, feels tight, or becomes red. °· You have numbness in your leg. °SEEK IMMEDIATE MEDICAL CARE IF: °· Your pain gets much worse. °· Blood or fluid leaks from any of the incisions. °· Your incisions become warm, swollen, or red. °· You have chest pain. °· You have trouble breathing. °· You have a fever. °· You have more pain near your leg incision. °MAKE SURE YOU: °· Understand these instructions. °· Will watch your condition. °· Will get help right away if you are not doing well or   get worse. °  °This information is not intended to replace advice given to you by your health care provider. Make sure you discuss any questions you have with your health care provider. °  °Document Released: 04/06/2011 Document Revised: 08/15/2014 Document Reviewed: 04/06/2011 °Elsevier Interactive Patient Education ©2016 [employer] ° ° °

## 2015-11-24 NOTE — H&P (Signed)
Physical Medicine and Rehabilitation Admission H&P   CC: Debility   HPI: Christopher [name] is a 55 y.o. male with history of HTN, R-CAS who was admitted via [facility] with chest pain due to STEMI. He was found to have severe CAD on cardiac cath as well as > 80% R-ICA stenosis. Acute on combined systolic/diastolic CHF with mild volume overload treated with diuretics. He underwent CABG X 4 with placement on IAB by Dr. Prescott Gum and R-CEA by Dr. Bridgett Larsson. Post op had issues with VT/VF on 03/26-3/27 as well as issues with complete HB. IABP replaced or pressure support and requiring shocks as well as antiarrythmic's, external pacer for HB as well as re-intubation. pressors. Dr. Mercy Moore consulted for management of ARF due to ATN and recommended limiting steroids. He was unable to tolerate attempts at vent wean and tracheostomy placed on 04/05 by Dr. Nelda Marseille. He has been treated for HCAP and post op ileus treated with reglan.  Bloody secretions from trach resolved with d/c of heparin and he was extubated to ATC.  He is tolerating cuffed #6 trach but continues to have increased secretions therefore to continue on current trach until secretions improve. Sputum cultures 04/12 negative and he has completed treatment for possible HCAP.  PMSV trials initiated with trials of ice cubes.  He continues on tube feeds for nutritional support and has had wheezing with ambulation. Left pleural effusion persistent and Demadex adjusted.   Therapy ongoing and CIR recommended due to debility.     Review of Systems  HENT: Negative for hearing loss.   Eyes: Positive for blurred vision (left eye --history of strabismus. ). Negative for double vision.  Respiratory: Positive for cough and sputum production. Negative for shortness of breath.   Cardiovascular: Negative for chest pain and palpitations.  Gastrointestinal: Positive for diarrhea. Negative for heartburn and nausea.  Genitourinary: Negative for dysuria  and urgency.  Musculoskeletal: Negative for myalgias and back pain.  Neurological: Negative for dizziness, tingling and headaches.  Psychiatric/Behavioral: Negative for memory loss. The patient is not nervous/anxious.       Past Medical History  Diagnosis Date  . Hypertension   . TIA (transient ischemic attack)   . Stenosis of right carotid artery   . Complete heart block ([place]) 10/20/2015     Past Surgical History  Procedure Laterality Date  . Arm surgery Right 2003  . Eye surgery    . Laceration repair    . Cardiac catheterization N/A 10/20/2015    Procedure: Left Heart Cath and Coronary Angiography;  Surgeon: Sherren Mocha, MD;  Location: [facility];  Service: Cardiovascular;  Laterality: N/A;  . Coronary artery bypass graft N/A 10/27/2015    Procedure: CORONARY ARTERY BYPASS GRAFTING (CABG) x four, using left internal mammary artery and rt leg greater saphenous vein harvested endoscopically;  Surgeon: Ivin Poot, MD;  Location: [facility];  Service: Open Heart Surgery;  Laterality: N/A;  . Tee without cardioversion N/A 10/27/2015    Procedure: TRANSESOPHAGEAL ECHOCARDIOGRAM (TEE);  Surgeon: Ivin Poot, MD;  Location: [facility];  Service: Open Heart Surgery;  Laterality: N/A;  . Endarterectomy Right 10/27/2015    Procedure: RIGHT ENDARTERECTOMY CAROTID with patch angioplasty using Xenosure bovine pericardium patch;  Surgeon: Conrad Okeechobee, MD;  Location: [facility];  Service: Vascular;  Laterality: Right;  . Cardiac catheterization N/A 11/02/2015    Procedure: Right/Left Heart Cath and Coronary/Graft Angiography;  Surgeon: Larey Dresser, MD;  Location: [facility];  Service: Cardiovascular;  Laterality: N/A;     Family History  Problem Relation Age of Onset  . Heart disease Mother   . Failure to thrive Father   . Congestive Heart Failure Father     Deceased  . Breast cancer Sister     Living     Social History:  reports that he quit smoking about 13 years ago. He  has never used smokeless tobacco. He reports that he uses illicit drugs (Marijuana). He reports that he does not drink alcohol.    :  Allergies  Allergen Reactions  . Losartan Shortness Of Breath  . Tressie Ellis [Ceftazidime] Rash    Rash with blisters  . Lisinopril Rash    Medications Prior to Admission  Medication Sig Dispense Refill  . albuterol (PROAIR HFA) 108 (90 Base) MCG/ACT inhaler Inhale 2 puffs into the lungs every 6 (six) hours as needed. Shortness of breath    . aspirin 325 MG tablet Take 325 mg by mouth daily.    Marland Kitchen lisinopril (PRINIVIL,ZESTRIL) 20 MG tablet Take 1 tablet (20 mg total) by mouth daily. 30 tablet 5  . lovastatin (MEVACOR) 20 MG tablet Take 1 tablet (20 mg total) by mouth at bedtime. 30 tablet 5  . [EXPIRED] predniSONE (DELTASONE) 10 MG tablet Take 1 tablet by mouth taper from 4 doses each day to 1 dose and stop.      Home: Home Living Family/patient expects to be discharged to:: Inpatient rehab Living Arrangements: Spouse/significant other Available Help at Discharge: Family, Available 24 hours/day (wife will take off work to care for pt) Type of Home: Apartment Home Access: Stairs to enter Technical brewer of Steps: 8+8 Entrance Stairs-Rails: Right, Left Home Layout: One level Bathroom Shower/Tub: Chiropodist: Handicapped height Home Equipment: None Additional Comments: Limited information due to trach/vent   Functional History: Prior Function Level of Independence: Independent Comments: 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  Functional Status:  Mobility: Bed Mobility Overal bed mobility: Needs Assistance Bed Mobility: Rolling, Sidelying to Sit Rolling: Supervision Sidelying to sit: HOB elevated, Supervision Sit to supine: Min guard General bed mobility comments: pt in chair Transfers Overall transfer level: Needs assistance Equipment used: 1 person hand held assist Transfers: Sit to/from  Stand Sit to Stand: Min guard Stand pivot transfers: Min assist General transfer comment: assist and cues for precautions Ambulation/Gait Ambulation/Gait assistance: Min assist Ambulation Distance (Feet): 300 Feet Assistive device: Rolling walker (2 wheeled) Gait Pattern/deviations: Step-through pattern, Decreased stride length, Trunk flexed General Gait Details: slower pace this session and with some stops due to wheezing, RN aware to call for breathing tx Gait velocity: decreased Gait velocity interpretation: Below normal speed for age/gender    ADL: ADL Overall ADL's : Needs assistance/impaired Eating/Feeding: Set up, Sitting Eating/Feeding Details (indicate cue type and reason): ice chips Grooming: Wash/dry hands, Standing, Min guard Upper Body Bathing: Minimal assitance, Sitting Lower Body Bathing: Minimal assistance, Sit to/from stand Upper Body Dressing : Minimal assistance, Sitting Upper Body Dressing Details (indicate cue type and reason): front opening gown Lower Body Dressing: Minimal assistance, Sit to/from stand Lower Body Dressing Details (indicate cue type and reason): required rest break between putting socks on, able to cross foot over opposite knee Toilet Transfer: Min guard Toileting- Clothing Manipulation and Hygiene: Total assistance, Sit to/from stand Functional mobility during ADLs: Min guard (pushing w/c) General ADL Comments: Instructed pt and wife on energy conservation strategies including benefits of sitting to shower.  Cognition: Cognition Overall Cognitive Status: Impaired/Different from baseline Orientation Level: Oriented X4 Cognition Arousal/Alertness: Awake/alert Behavior During Therapy: WFL for tasks assessed/performed Overall Cognitive Status: Impaired/Different from baseline Area of Impairment: Memory Memory: Decreased short-term memory Difficult to assess due to: Tracheostomy   Blood pressure 110/68, pulse 79, temperature 97.8 F (36.6  C), temperature source Oral, resp. rate 19, height 5' 10.5" (1.791 m), weight 103.7 kg (228 lb 9.9 oz), SpO2 97 %. Physical Exam  Nursing note and vitals reviewed. Constitutional: He is oriented to person, place, and time. He appears well-developed and well-nourished.  Panda in nares. Trach with PMSV and strong wet voice noted.   HENT:  Head: Normocephalic and atraumatic.  Mouth/Throat: Oropharynx is clear and moist.  Eyes: Conjunctivae are normal. Pupils are equal, round, and reactive to light.  Neck: Neck supple.  #6 cuffed trach in place.   Cardiovascular: Normal rate and regular rhythm.  Exam reveals distant heart sounds.   Respiratory: Effort normal. No respiratory distress. He has decreased breath sounds in the left middle field and the left lower field. He has no wheezes. He has rhonchi.  GI: He exhibits distension. Bowel sounds are increased. There is no tenderness. There is no rebound.  Protuberant.   Musculoskeletal: He exhibits edema.  BLE with edema tibially and 2+ pedally with unna boots in place.   Neurological: He is alert and oriented to person, place, and time.  Wet voice but speech clear. Follows commands without difficulty. UE 3-4/5 prox to distal. LE: 3/5 HF, 3+ KE and 4/5 ADF/PF. No gross sensory deficits. Cognitively appropriate with reasonable insight and awareness.    Skin: Skin is warm and dry.  Sternal incision clean and dry with scabs.  Rash on left buttocks--drug eruption per wife.  Irritated area intergluteal cleft. Right leg donor site with staples and intact  Psychiatric: He has a normal mood and affect. His behavior is normal.    Results for orders placed or performed during the hospital encounter of 10/20/15 (from the past 48 hour(s))  Glucose, capillary     Status: Abnormal   Collection Time: 11/22/15  5:35 PM  Result Value Ref Range   Glucose-Capillary 100 (H) 65 - 99 mg/dL   Comment 1 Notify RN   Glucose, capillary     Status: None   Collection  Time: 11/22/15  8:36 PM  Result Value Ref Range   Glucose-Capillary 93 65 - 99 mg/dL   Comment 1 Notify RN    Comment 2 Document in Chart   Glucose, capillary     Status: Abnormal   Collection Time: 11/22/15 11:48 PM  Result Value Ref Range   Glucose-Capillary 100 (H) 65 - 99 mg/dL   Comment 1 Notify RN    Comment 2 Document in Chart   Glucose, capillary     Status: None   Collection Time: 11/23/15  3:38 AM  Result Value Ref Range   Glucose-Capillary 94 65 - 99 mg/dL   Comment 1 Notify RN    Comment 2 Document in Chart   Magnesium     Status: None   Collection Time: 11/23/15  4:10 AM  Result Value Ref Range   Magnesium 2.0 1.7 - 2.4 mg/dL  CBC     Status: Abnormal   Collection Time: 11/23/15  4:10 AM  Result Value Ref Range   WBC 8.5 4.0 - 10.5 K/uL   RBC 3.92 (L) 4.22 - 5.81 MIL/uL   Hemoglobin 10.8 (L) 13.0 - 17.0 g/dL  HCT 35.4 (L) 39.0 - 52.0 %   MCV 90.3 78.0 - 100.0 fL   MCH 27.6 26.0 - 34.0 pg   MCHC 30.5 30.0 - 36.0 g/dL   RDW 17.4 (H) 11.5 - 15.5 %   Platelets 243 150 - 400 K/uL  Basic metabolic panel     Status: Abnormal   Collection Time: 11/23/15  4:10 AM  Result Value Ref Range   Sodium 143 135 - 145 mmol/L   Potassium 4.2 3.5 - 5.1 mmol/L   Chloride 105 101 - 111 mmol/L   CO2 27 22 - 32 mmol/L   Glucose, Bld 101 (H) 65 - 99 mg/dL   BUN 49 (H) 6 - 20 mg/dL   Creatinine, Ser 1.62 (H) 0.61 - 1.24 mg/dL   Calcium 8.6 (L) 8.9 - 10.3 mg/dL   GFR calc non Af Amer 47 (L) >60 mL/min   GFR calc Af Amer 54 (L) >60 mL/min    Comment: (NOTE) The eGFR has been calculated using the CKD EPI equation. This calculation has not been validated in all clinical situations. eGFR's persistently <60 mL/min signify possible Chronic Kidney Disease.    Anion gap 11 5 - 15  Glucose, capillary     Status: Abnormal   Collection Time: 11/23/15  7:12 AM  Result Value Ref Range   Glucose-Capillary 100 (H) 65 - 99 mg/dL   Comment 1 Venous Specimen   Glucose, capillary      Status: Abnormal   Collection Time: 11/23/15 11:13 AM  Result Value Ref Range   Glucose-Capillary 107 (H) 65 - 99 mg/dL   Comment 1 Venous Specimen   Glucose, capillary     Status: None   Collection Time: 11/23/15  3:54 PM  Result Value Ref Range   Glucose-Capillary 95 65 - 99 mg/dL   Comment 1 Notify RN   Glucose, capillary     Status: None   Collection Time: 11/23/15  7:44 PM  Result Value Ref Range   Glucose-Capillary 91 65 - 99 mg/dL  Glucose, capillary     Status: None   Collection Time: 11/23/15 10:49 PM  Result Value Ref Range   Glucose-Capillary 94 65 - 99 mg/dL  Glucose, capillary     Status: None   Collection Time: 11/24/15  3:09 AM  Result Value Ref Range   Glucose-Capillary 98 65 - 99 mg/dL  CBC     Status: Abnormal   Collection Time: 11/24/15  5:15 AM  Result Value Ref Range   WBC 8.6 4.0 - 10.5 K/uL   RBC 4.00 (L) 4.22 - 5.81 MIL/uL   Hemoglobin 10.9 (L) 13.0 - 17.0 g/dL   HCT 36.0 (L) 39.0 - 52.0 %   MCV 90.0 78.0 - 100.0 fL   MCH 27.3 26.0 - 34.0 pg   MCHC 30.3 30.0 - 36.0 g/dL   RDW 17.5 (H) 11.5 - 15.5 %   Platelets 226 150 - 400 K/uL  Basic metabolic panel     Status: Abnormal   Collection Time: 11/24/15  5:15 AM  Result Value Ref Range   Sodium 141 135 - 145 mmol/L   Potassium 5.0 3.5 - 5.1 mmol/L   Chloride 103 101 - 111 mmol/L   CO2 28 22 - 32 mmol/L   Glucose, Bld 104 (H) 65 - 99 mg/dL   BUN 48 (H) 6 - 20 mg/dL   Creatinine, Ser 1.51 (H) 0.61 - 1.24 mg/dL   Calcium 8.7 (L) 8.9 - 10.3 mg/dL   GFR  calc non Af Amer 51 (L) >60 mL/min   GFR calc Af Amer 59 (L) >60 mL/min    Comment: (NOTE) The eGFR has been calculated using the CKD EPI equation. This calculation has not been validated in all clinical situations. eGFR's persistently <60 mL/min signify possible Chronic Kidney Disease.    Anion gap 10 5 - 15  Glucose, capillary     Status: None   Collection Time: 11/24/15  7:25 AM  Result Value Ref Range   Glucose-Capillary 90 65 - 99 mg/dL    Comment 1 Notify RN   Glucose, capillary     Status: Abnormal   Collection Time: 11/24/15 11:51 AM  Result Value Ref Range   Glucose-Capillary 101 (H) 65 - 99 mg/dL   Comment 1 Notify RN    Dg Chest 2 View  11/24/2015  CLINICAL DATA:  Respiratory failure. EXAM: CHEST  2 VIEW COMPARISON:  11/23/2015 and 11/21/2015. FINDINGS: Tracheostomy, feeding tube and right arm PICC appear unchanged. There appears to be additional tubing overlying the upper right chest on the frontal examination, not clearly seen on the lateral view. The heart size and mediastinal contours are stable status post CABG. Left pleural effusion and left basilar airspace disease have not significantly changed. The right lung is clear. There is no pneumothorax. IMPRESSION: No significant change in left pleural effusion and left basilar airspace disease. Electronically Signed   By: Richardean Sale M.D.   On: 11/24/2015 08:16   Dg Chest Port 1 View  11/23/2015  CLINICAL DATA:  Heart failure. EXAM: PORTABLE CHEST 1 VIEW COMPARISON:  November 21, 2015. FINDINGS: Stable cardiomegaly. Status post Coronary artery bypass graft. Tracheostomy tube and right-sided PICC line are unchanged in position. No pneumothorax is noted. Stable left basilar opacity is noted concerning for infiltrate or atelectasis with associated pleural effusion. Mild central pulmonary vascular congestion is noted. Bony thorax is unremarkable. IMPRESSION: Stable support apparatus. Stable cardiomegaly. Stable left basilar opacity is noted consistent with atelectasis or infiltrate with associated pleural effusion. Electronically Signed   By: Marijo Conception, M.D.   On: 11/23/2015 10:12       Medical Problem List and Plan: 1.  Weakness and functional deficits secondary to Debility.  2.  DVT Prophylaxis/Anticoagulation: Pharmaceutical: Lovenox 3. Pain Management:  4. Mood: LCSW to follow for evaluation and support.  5. Neuropsych: This patient is capable of making decisions on  his own behalf. 6. Skin/Wound Care:  Monitor wound for healing.  7. Fluids/Electrolytes/Nutrition:  On tube feeds. Monitor renal insufficiency with labs in am. 8. CAD s/p CABG:  Continue sternal precautions. Monitor for symptoms with increased activity. On ASA and statin.  9. HTN: Monitor BP bid.  10. Acute combined systolic and diastolic CHF:  Daily weights. Demadex to be increased in am. On hydralazine, demadex, sildenafil and statin.  11. Acute on chronic respiratory failure/OSA/OHS: continue ATC with cuffed #6 trach per PCCM. Continue xopenex every 6 hours.  12.  Shocked liver: Check follow up labs in am. No GI complaints.  13. Adynamic ileus:  Continue reglan.  Will check follow up KUB 14. ABLA: Stable.  15. Complete HB: resolved.  Had V Fib arrest--may need ICD v/s Life Vest at discharge? 16.  A flutter:  In NSR on amiodarone--no anticoagulation unless A fib/A flutter recurs.  17. Acute renal failure: Likely due to diuresis. Continue water flushes.  Recheck lytes in am--K+ on upward trend.--decrease K dur to daily.     Post Admission Physician Evaluation: 1.  Functional deficits secondary  to CAD and multiple medical issues. 2. Patient is admitted to receive collaborative, interdisciplinary care between the physiatrist, rehab nursing staff, and therapy team. 3. Patient's level of medical complexity and substantial therapy needs in context of that medical necessity cannot be provided at a lesser intensity of care such as a SNF. 4. Patient has experienced substantial functional loss from his/her baseline which was documented above under the "Functional History" and "Functional Status" headings.  Judging by the patient's diagnosis, physical exam, and functional history, the patient has potential for functional progress which will result in measurable gains while on inpatient rehab.  These gains will be of substantial and practical use upon discharge  in facilitating mobility and self-care at  the household level. 5. Physiatrist will provide 24 hour management of medical needs as well as oversight of the therapy plan/treatment and provide guidance as appropriate regarding the interaction of the two. 6. 24 hour rehab nursing will assist with bladder management, bowel management, safety, skin/wound care, disease management, medication administration, pain management and patient education  and help integrate therapy concepts, techniques,education, etc. 7. PT will assess and treat for/with: Lower extremity strength, range of motion, stamina, balance, functional mobility, safety, adaptive techniques and equipment, NMR, observation of sternal precautions, ego support, education.   Goals are: mod I. 8. OT will assess and treat for/with: ADL's, functional mobility, safety, upper extremity strength, adaptive techniques and equipment, NMR, sternal precautions, education, community reintegration.   Goals are: mod I. Therapy may proceed with showering this patient. 9. SLP will assess and treat for/with: n/a.  Goals are: n/a. 10. Case Management and Social Worker will assess and treat for psychological issues and discharge planning. 11. Team conference will be held weekly to assess progress toward goals and to determine barriers to discharge. 12. Patient will receive at least 3 hours of therapy per day at least 5 days per week. 13. ELOS: 7-8 days       14. Prognosis:  excellent     Meredith Staggers, MD, [facility] 11/24/2015   11/24/2015

## 2015-11-24 NOTE — H&P (View-Only) (Signed)
Physical Medicine and Rehabilitation Admission H&P   CC: Debility   HPI: Christopher [name] is a 55 y.o. male with history of HTN, R-CAS who was admitted via [facility] with chest pain due to STEMI. He was found to have severe CAD on cardiac cath as well as > 80% R-ICA stenosis. Acute on combined systolic/diastolic CHF with mild volume overload treated with diuretics. He underwent CABG X 4 with placement on IAB by Dr. Prescott Gum and R-CEA by Dr. Bridgett Larsson. Post op had issues with VT/VF on 03/26-3/27 as well as issues with complete HB. IABP replaced or pressure support and requiring shocks as well as antiarrythmic's, external pacer for HB as well as re-intubation. pressors. Dr. Mercy Moore consulted for management of ARF due to ATN and recommended limiting steroids. He was unable to tolerate attempts at vent wean and tracheostomy placed on 04/05 by Dr. Nelda Marseille. He has been treated for HCAP and post op ileus treated with reglan.  Bloody secretions from trach resolved with d/c of heparin and he was extubated to ATC.  He is tolerating cuffed #6 trach but continues to have increased secretions therefore to continue on current trach until secretions improve. Sputum cultures 04/12 negative and he has completed treatment for possible HCAP.  PMSV trials initiated with trials of ice cubes.  He continues on tube feeds for nutritional support and has had wheezing with ambulation. Left pleural effusion persistent and Demadex adjusted.   Therapy ongoing and CIR recommended due to debility.     Review of Systems  HENT: Negative for hearing loss.   Eyes: Positive for blurred vision (left eye --history of strabismus. ). Negative for double vision.  Respiratory: Positive for cough and sputum production. Negative for shortness of breath.   Cardiovascular: Negative for chest pain and palpitations.  Gastrointestinal: Positive for diarrhea. Negative for heartburn and nausea.  Genitourinary: Negative for dysuria  and urgency.  Musculoskeletal: Negative for myalgias and back pain.  Neurological: Negative for dizziness, tingling and headaches.  Psychiatric/Behavioral: Negative for memory loss. The patient is not nervous/anxious.       Past Medical History  Diagnosis Date  . Hypertension   . TIA (transient ischemic attack)   . Stenosis of right carotid artery   . Complete heart block ([place]) 10/20/2015     Past Surgical History  Procedure Laterality Date  . Arm surgery Right 2003  . Eye surgery    . Laceration repair    . Cardiac catheterization N/A 10/20/2015    Procedure: Left Heart Cath and Coronary Angiography;  Surgeon: Sherren Mocha, MD;  Location: [facility];  Service: Cardiovascular;  Laterality: N/A;  . Coronary artery bypass graft N/A 10/27/2015    Procedure: CORONARY ARTERY BYPASS GRAFTING (CABG) x four, using left internal mammary artery and rt leg greater saphenous vein harvested endoscopically;  Surgeon: Ivin Poot, MD;  Location: [facility];  Service: Open Heart Surgery;  Laterality: N/A;  . Tee without cardioversion N/A 10/27/2015    Procedure: TRANSESOPHAGEAL ECHOCARDIOGRAM (TEE);  Surgeon: Ivin Poot, MD;  Location: [facility];  Service: Open Heart Surgery;  Laterality: N/A;  . Endarterectomy Right 10/27/2015    Procedure: RIGHT ENDARTERECTOMY CAROTID with patch angioplasty using Xenosure bovine pericardium patch;  Surgeon: Conrad Okeechobee, MD;  Location: [facility];  Service: Vascular;  Laterality: Right;  . Cardiac catheterization N/A 11/02/2015    Procedure: Right/Left Heart Cath and Coronary/Graft Angiography;  Surgeon: Larey Dresser, MD;  Location: [facility];  Service: Cardiovascular;  Laterality: N/A;     Family History  Problem Relation Age of Onset  . Heart disease Mother   . Failure to thrive Father   . Congestive Heart Failure Father     Deceased  . Breast cancer Sister     Living     Social History:  reports that he quit smoking about 13 years ago. He  has never used smokeless tobacco. He reports that he uses illicit drugs (Marijuana). He reports that he does not drink alcohol.    :  Allergies  Allergen Reactions  . Losartan Shortness Of Breath  . Fortaz [Ceftazidime] Rash    Rash with blisters  . Lisinopril Rash    Medications Prior to Admission  Medication Sig Dispense Refill  . albuterol (PROAIR HFA) 108 (90 Base) MCG/ACT inhaler Inhale 2 puffs into the lungs every 6 (six) hours as needed. Shortness of breath    . aspirin 325 MG tablet Take 325 mg by mouth daily.    . lisinopril (PRINIVIL,ZESTRIL) 20 MG tablet Take 1 tablet (20 mg total) by mouth daily. 30 tablet 5  . lovastatin (MEVACOR) 20 MG tablet Take 1 tablet (20 mg total) by mouth at bedtime. 30 tablet 5  . [EXPIRED] predniSONE (DELTASONE) 10 MG tablet Take 1 tablet by mouth taper from 4 doses each day to 1 dose and stop.      Home: Home Living Family/patient expects to be discharged to:: Inpatient rehab Living Arrangements: Spouse/significant other Available Help at Discharge: Family, Available 24 hours/day (wife will take off work to care for pt) Type of Home: Apartment Home Access: Stairs to enter Entrance Stairs-Number of Steps: 8+8 Entrance Stairs-Rails: Right, Left Home Layout: One level Bathroom Shower/Tub: Tub/shower unit Bathroom Toilet: Handicapped height Home Equipment: None Additional Comments: Limited information due to trach/vent   Functional History: Prior Function Level of Independence: Independent Comments: 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  Functional Status:  Mobility: Bed Mobility Overal bed mobility: Needs Assistance Bed Mobility: Rolling, Sidelying to Sit Rolling: Supervision Sidelying to sit: HOB elevated, Supervision Sit to supine: Min guard General bed mobility comments: pt in chair Transfers Overall transfer level: Needs assistance Equipment used: 1 person hand held assist Transfers: Sit to/from  Stand Sit to Stand: Min guard Stand pivot transfers: Min assist General transfer comment: assist and cues for precautions Ambulation/Gait Ambulation/Gait assistance: Min assist Ambulation Distance (Feet): 300 Feet Assistive device: Rolling walker (2 wheeled) Gait Pattern/deviations: Step-through pattern, Decreased stride length, Trunk flexed General Gait Details: slower pace this session and with some stops due to wheezing, RN aware to call for breathing tx Gait velocity: decreased Gait velocity interpretation: Below normal speed for age/gender    ADL: ADL Overall ADL's : Needs assistance/impaired Eating/Feeding: Set up, Sitting Eating/Feeding Details (indicate cue type and reason): ice chips Grooming: Wash/dry hands, Standing, Min guard Upper Body Bathing: Minimal assitance, Sitting Lower Body Bathing: Minimal assistance, Sit to/from stand Upper Body Dressing : Minimal assistance, Sitting Upper Body Dressing Details (indicate cue type and reason): front opening gown Lower Body Dressing: Minimal assistance, Sit to/from stand Lower Body Dressing Details (indicate cue type and reason): required rest break between putting socks on, able to cross foot over opposite knee Toilet Transfer: Min guard Toileting- Clothing Manipulation and Hygiene: Total assistance, Sit to/from stand Functional mobility during ADLs: Min guard (pushing w/c) General ADL Comments: Instructed pt and wife on energy conservation strategies including benefits of sitting to shower.    Cognition: Cognition Overall Cognitive Status: Impaired/Different from baseline Orientation Level: Oriented X4 Cognition Arousal/Alertness: Awake/alert Behavior During Therapy: WFL for tasks assessed/performed Overall Cognitive Status: Impaired/Different from baseline Area of Impairment: Memory Memory: Decreased short-term memory Difficult to assess due to: Tracheostomy   Blood pressure 110/68, pulse 79, temperature 97.8 F (36.6  C), temperature source Oral, resp. rate 19, height 5' 10.5" (1.791 m), weight 103.7 kg (228 lb 9.9 oz), SpO2 97 %. Physical Exam  Nursing note and vitals reviewed. Constitutional: He is oriented to person, place, and time. He appears well-developed and well-nourished.  Panda in nares. Trach with PMSV and strong wet voice noted.   HENT:  Head: Normocephalic and atraumatic.  Mouth/Throat: Oropharynx is clear and moist.  Eyes: Conjunctivae are normal. Pupils are equal, round, and reactive to light.  Neck: Neck supple.  #6 cuffed trach in place.   Cardiovascular: Normal rate and regular rhythm.  Exam reveals distant heart sounds.   Respiratory: Effort normal. No respiratory distress. He has decreased breath sounds in the left middle field and the left lower field. He has no wheezes. He has rhonchi.  GI: He exhibits distension. Bowel sounds are increased. There is no tenderness. There is no rebound.  Protuberant.   Musculoskeletal: He exhibits edema.  BLE with edema tibially and 2+ pedally with unna boots in place.   Neurological: He is alert and oriented to person, place, and time.  Wet voice but speech clear. Follows commands without difficulty. UE 3-4/5 prox to distal. LE: 3/5 HF, 3+ KE and 4/5 ADF/PF. No gross sensory deficits. Cognitively appropriate with reasonable insight and awareness.    Skin: Skin is warm and dry.  Sternal incision clean and dry with scabs.  Rash on left buttocks--drug eruption per wife.  Irritated area intergluteal cleft. Right leg donor site with staples and intact  Psychiatric: He has a normal mood and affect. His behavior is normal.    Results for orders placed or performed during the hospital encounter of 10/20/15 (from the past 48 hour(s))  Glucose, capillary     Status: Abnormal   Collection Time: 11/22/15  5:35 PM  Result Value Ref Range   Glucose-Capillary 100 (H) 65 - 99 mg/dL   Comment 1 Notify RN   Glucose, capillary     Status: None   Collection  Time: 11/22/15  8:36 PM  Result Value Ref Range   Glucose-Capillary 93 65 - 99 mg/dL   Comment 1 Notify RN    Comment 2 Document in Chart   Glucose, capillary     Status: Abnormal   Collection Time: 11/22/15 11:48 PM  Result Value Ref Range   Glucose-Capillary 100 (H) 65 - 99 mg/dL   Comment 1 Notify RN    Comment 2 Document in Chart   Glucose, capillary     Status: None   Collection Time: 11/23/15  3:38 AM  Result Value Ref Range   Glucose-Capillary 94 65 - 99 mg/dL   Comment 1 Notify RN    Comment 2 Document in Chart   Magnesium     Status: None   Collection Time: 11/23/15  4:10 AM  Result Value Ref Range   Magnesium 2.0 1.7 - 2.4 mg/dL  CBC     Status: Abnormal   Collection Time: 11/23/15  4:10 AM  Result Value Ref Range   WBC 8.5 4.0 - 10.5 K/uL   RBC 3.92 (L) 4.22 - 5.81 MIL/uL   Hemoglobin 10.8 (L) 13.0 - 17.0 g/dL  HCT 35.4 (L) 39.0 - 52.0 %   MCV 90.3 78.0 - 100.0 fL   MCH 27.6 26.0 - 34.0 pg   MCHC 30.5 30.0 - 36.0 g/dL   RDW 17.4 (H) 11.5 - 15.5 %   Platelets 243 150 - 400 K/uL  Basic metabolic panel     Status: Abnormal   Collection Time: 11/23/15  4:10 AM  Result Value Ref Range   Sodium 143 135 - 145 mmol/L   Potassium 4.2 3.5 - 5.1 mmol/L   Chloride 105 101 - 111 mmol/L   CO2 27 22 - 32 mmol/L   Glucose, Bld 101 (H) 65 - 99 mg/dL   BUN 49 (H) 6 - 20 mg/dL   Creatinine, Ser 1.62 (H) 0.61 - 1.24 mg/dL   Calcium 8.6 (L) 8.9 - 10.3 mg/dL   GFR calc non Af Amer 47 (L) >60 mL/min   GFR calc Af Amer 54 (L) >60 mL/min    Comment: (NOTE) The eGFR has been calculated using the CKD EPI equation. This calculation has not been validated in all clinical situations. eGFR's persistently <60 mL/min signify possible Chronic Kidney Disease.    Anion gap 11 5 - 15  Glucose, capillary     Status: Abnormal   Collection Time: 11/23/15  7:12 AM  Result Value Ref Range   Glucose-Capillary 100 (H) 65 - 99 mg/dL   Comment 1 Venous Specimen   Glucose, capillary      Status: Abnormal   Collection Time: 11/23/15 11:13 AM  Result Value Ref Range   Glucose-Capillary 107 (H) 65 - 99 mg/dL   Comment 1 Venous Specimen   Glucose, capillary     Status: None   Collection Time: 11/23/15  3:54 PM  Result Value Ref Range   Glucose-Capillary 95 65 - 99 mg/dL   Comment 1 Notify RN   Glucose, capillary     Status: None   Collection Time: 11/23/15  7:44 PM  Result Value Ref Range   Glucose-Capillary 91 65 - 99 mg/dL  Glucose, capillary     Status: None   Collection Time: 11/23/15 10:49 PM  Result Value Ref Range   Glucose-Capillary 94 65 - 99 mg/dL  Glucose, capillary     Status: None   Collection Time: 11/24/15  3:09 AM  Result Value Ref Range   Glucose-Capillary 98 65 - 99 mg/dL  CBC     Status: Abnormal   Collection Time: 11/24/15  5:15 AM  Result Value Ref Range   WBC 8.6 4.0 - 10.5 K/uL   RBC 4.00 (L) 4.22 - 5.81 MIL/uL   Hemoglobin 10.9 (L) 13.0 - 17.0 g/dL   HCT 36.0 (L) 39.0 - 52.0 %   MCV 90.0 78.0 - 100.0 fL   MCH 27.3 26.0 - 34.0 pg   MCHC 30.3 30.0 - 36.0 g/dL   RDW 17.5 (H) 11.5 - 15.5 %   Platelets 226 150 - 400 K/uL  Basic metabolic panel     Status: Abnormal   Collection Time: 11/24/15  5:15 AM  Result Value Ref Range   Sodium 141 135 - 145 mmol/L   Potassium 5.0 3.5 - 5.1 mmol/L   Chloride 103 101 - 111 mmol/L   CO2 28 22 - 32 mmol/L   Glucose, Bld 104 (H) 65 - 99 mg/dL   BUN 48 (H) 6 - 20 mg/dL   Creatinine, Ser 1.51 (H) 0.61 - 1.24 mg/dL   Calcium 8.7 (L) 8.9 - 10.3 mg/dL   GFR  calc non Af Amer 51 (L) >60 mL/min   GFR calc Af Amer 59 (L) >60 mL/min    Comment: (NOTE) The eGFR has been calculated using the CKD EPI equation. This calculation has not been validated in all clinical situations. eGFR's persistently <60 mL/min signify possible Chronic Kidney Disease.    Anion gap 10 5 - 15  Glucose, capillary     Status: None   Collection Time: 11/24/15  7:25 AM  Result Value Ref Range   Glucose-Capillary 90 65 - 99 mg/dL    Comment 1 Notify RN   Glucose, capillary     Status: Abnormal   Collection Time: 11/24/15 11:51 AM  Result Value Ref Range   Glucose-Capillary 101 (H) 65 - 99 mg/dL   Comment 1 Notify RN    Dg Chest 2 View  11/24/2015  CLINICAL DATA:  Respiratory failure. EXAM: CHEST  2 VIEW COMPARISON:  11/23/2015 and 11/21/2015. FINDINGS: Tracheostomy, feeding tube and right arm PICC appear unchanged. There appears to be additional tubing overlying the upper right chest on the frontal examination, not clearly seen on the lateral view. The heart size and mediastinal contours are stable status post CABG. Left pleural effusion and left basilar airspace disease have not significantly changed. The right lung is clear. There is no pneumothorax. IMPRESSION: No significant change in left pleural effusion and left basilar airspace disease. Electronically Signed   By: William  Veazey M.D.   On: 11/24/2015 08:16   Dg Chest Port 1 View  11/23/2015  CLINICAL DATA:  Heart failure. EXAM: PORTABLE CHEST 1 VIEW COMPARISON:  November 21, 2015. FINDINGS: Stable cardiomegaly. Status post Coronary artery bypass graft. Tracheostomy tube and right-sided PICC line are unchanged in position. No pneumothorax is noted. Stable left basilar opacity is noted concerning for infiltrate or atelectasis with associated pleural effusion. Mild central pulmonary vascular congestion is noted. Bony thorax is unremarkable. IMPRESSION: Stable support apparatus. Stable cardiomegaly. Stable left basilar opacity is noted consistent with atelectasis or infiltrate with associated pleural effusion. Electronically Signed   By: James  Green Jr, M.D.   On: 11/23/2015 10:12       Medical Problem List and Plan: 1.  Weakness and functional deficits secondary to Debility.  2.  DVT Prophylaxis/Anticoagulation: Pharmaceutical: Lovenox 3. Pain Management:  4. Mood: LCSW to follow for evaluation and support.  5. Neuropsych: This patient is capable of making decisions on  his own behalf. 6. Skin/Wound Care:  Monitor wound for healing.  7. Fluids/Electrolytes/Nutrition:  On tube feeds. Monitor renal insufficiency with labs in am. 8. CAD s/p CABG:  Continue sternal precautions. Monitor for symptoms with increased activity. On ASA and statin.  9. HTN: Monitor BP bid.  10. Acute combined systolic and diastolic CHF:  Daily weights. Demadex to be increased in am. On hydralazine, demadex, sildenafil and statin.  11. Acute on chronic respiratory failure/OSA/OHS: continue ATC with cuffed #6 trach per PCCM. Continue xopenex every 6 hours.  12.  Shocked liver: Check follow up labs in am. No GI complaints.  13. Adynamic ileus:  Continue reglan.  Will check follow up KUB 14. ABLA: Stable.  15. Complete HB: resolved.  Had V Fib arrest--may need ICD v/s Life Vest at discharge? 16.  A flutter:  In NSR on amiodarone--no anticoagulation unless A fib/A flutter recurs.  17. Acute renal failure: Likely due to diuresis. Continue water flushes.  Recheck lytes in am--K+ on upward trend.--decrease K dur to daily.     Post Admission Physician Evaluation: 1.   Functional deficits secondary  to CAD and multiple medical issues. 2. Patient is admitted to receive collaborative, interdisciplinary care between the physiatrist, rehab nursing staff, and therapy team. 3. Patient's level of medical complexity and substantial therapy needs in context of that medical necessity cannot be provided at a lesser intensity of care such as a SNF. 4. Patient has experienced substantial functional loss from his/her baseline which was documented above under the "Functional History" and "Functional Status" headings.  Judging by the patient's diagnosis, physical exam, and functional history, the patient has potential for functional progress which will result in measurable gains while on inpatient rehab.  These gains will be of substantial and practical use upon discharge  in facilitating mobility and self-care at  the household level. 5. Physiatrist will provide 24 hour management of medical needs as well as oversight of the therapy plan/treatment and provide guidance as appropriate regarding the interaction of the two. 6. 24 hour rehab nursing will assist with bladder management, bowel management, safety, skin/wound care, disease management, medication administration, pain management and patient education  and help integrate therapy concepts, techniques,education, etc. 7. PT will assess and treat for/with: Lower extremity strength, range of motion, stamina, balance, functional mobility, safety, adaptive techniques and equipment, NMR, observation of sternal precautions, ego support, education.   Goals are: mod I. 8. OT will assess and treat for/with: ADL's, functional mobility, safety, upper extremity strength, adaptive techniques and equipment, NMR, sternal precautions, education, community reintegration.   Goals are: mod I. Therapy may proceed with showering this patient. 9. SLP will assess and treat for/with: n/a.  Goals are: n/a. 10. Case Management and Social Worker will assess and treat for psychological issues and discharge planning. 11. Team conference will be held weekly to assess progress toward goals and to determine barriers to discharge. 12. Patient will receive at least 3 hours of therapy per day at least 5 days per week. 13. ELOS: 7-8 days       14. Prognosis:  excellent     Kennadie Brenner T. Deklyn Trachtenberg, MD, FAAPMR [facility] 11/24/2015   11/24/2015 

## 2015-11-24 NOTE — Progress Notes (Signed)
Rehab admissions - I have sent updated clinicals to [employer] [place], but I have not heard back from insurance carrier about possible acute inpatient rehab admission.  I will update all once I hear back from insurance case manager.  Call me for questions.  #[phone]

## 2015-11-24 NOTE — [place] FL2 (Signed)
[place] MEDICAID FL2 LEVEL OF CARE SCREENING TOOL     IDENTIFICATION  Patient Name: Christopher [name] Birthdate: [birth year] Sex: male Admission Date (Current Location): 10/20/2015  [facility] and [place] Number:  Haynes Bast [id number] Facility and Address:  [facility], [address] [place], [place] [address]      Provider Number: [id number]  Attending Physician Name and Address:  Kerin Perna, MD  Relative Name and Phone Number:       Current Level of Care: Hospital Recommended Level of Care: Skilled Nursing Facility Prior Approval Number:    Date Approved/Denied:   PASRR Number: [id number]  Discharge Plan: Other (Comment) (unsure at this time. SNF vs. CIR)    Current Diagnoses: Patient Active Problem List   Diagnosis Date Noted  . VT (ventricular tachycardia) (HCC)   . Ileus (HCC)   . Hyperosmolality and/or hypernatremia   . Acute respiratory failure with hypoxia (HCC)   . Chronic obstructive pulmonary disease (HCC)   . Acute renal failure (HCC)   . Ventilator dependence (HCC)   . Tracheostomy status (HCC)   . S/P CABG x 4 10/27/2015  . CAD (coronary artery disease)   . Hypertensive heart disease with heart failure (HCC)   . Carotid artery stenosis   . Acute combined systolic and diastolic heart failure (HCC) 10/20/2015  . Complete heart block (HCC) 10/20/2015  . Acute MI, inferior wall, initial episode of care (HCC) 10/20/2015  . TIA (transient ischemic attack) 12/25/2014  . Essential hypertension 12/25/2014  . Hyperlipidemia 12/25/2014    Orientation RESPIRATION BLADDER Height & Weight     Self, Time, Situation, Place  Normal Continent Weight: 228 lb 9.9 oz (103.7 kg) Height:  5' 10.5" (179.1 cm)  BEHAVIORAL SYMPTOMS/MOOD NEUROLOGICAL BOWEL NUTRITION STATUS   (NONE)  (NONE) Continent Feeding tube  AMBULATORY STATUS COMMUNICATION OF NEEDS Skin   Limited Assist Verbally Surgical wounds (INCISION. LOCATION: chest )                        Personal Care Assistance Level of Assistance  Feeding, Dressing, Bathing Bathing Assistance: Limited assistance Feeding assistance: Limited assistance Dressing Assistance: Limited assistance     Functional Limitations Info  Sight, Hearing, Speech Sight Info: Adequate Hearing Info: Adequate Speech Info: Adequate    SPECIAL CARE FACTORS FREQUENCY  PT (By licensed PT), OT (By licensed OT), Speech therapy     PT Frequency:  (3X/WEEK) OT Frequency:  (2X/WEEK)     Speech Therapy Frequency: 5/week      Contractures Contractures Info: Not present    Additional Factors Info  Code Status, Allergies Code Status Info:  (PRIOR) Allergies Info:  (Losartan, Fortaz, Lisinopril) Psychotropic Info: Seroquel Insulin Sliding Scale Info: 6/week       Current Medications (11/24/2015):  This is the current hospital active medication list Current Facility-Administered Medications  Medication Dose Route Frequency Provider Last Rate Last Dose  . 0.9 %  sodium chloride infusion   Intravenous Continuous Kerin Perna, MD   Stopped at 11/15/15 0600  . amiodarone (PACERONE) tablet 200 mg  200 mg Oral BID Dolores Patty, MD   200 mg at 11/24/15 0916  . antiseptic oral rinse (CPC / CETYLPYRIDINIUM CHLORIDE 0.05%) solution 7 mL  7 mL Mouth Rinse q12n4p Kerin Perna, MD   7 mL at 11/23/15 1600  . aspirin chewable tablet 81 mg  81 mg Oral Daily Laurey Morale, MD   81 mg  at 11/24/15 0916  . atorvastatin (LIPITOR) tablet 80 mg  80 mg Oral q1800 Tonny Bollman, MD   80 mg at 11/23/15 1842  . chlorhexidine (PERIDEX) 0.12 % solution 15 mL  15 mL Mouth Rinse BID Kerin Perna, MD   15 mL at 11/24/15 0915  . dextrose 5 % solution   Intravenous Continuous Kerin Perna, MD 10 mL/hr at 11/24/15 1100    . diphenhydrAMINE (BENADRYL) injection 25 mg  25 mg Intravenous Q8H PRN Loreli Slot, MD   25 mg at 11/22/15 2215  . enoxaparin (LOVENOX) injection 40 mg  40 mg  Subcutaneous Q24H Kerin Perna, MD   40 mg at 11/24/15 0915  . feeding supplement (PRO-STAT SUGAR FREE 64) liquid 30 mL  30 mL Per Tube TID Kerin Perna, MD   30 mL at 11/24/15 0914  . feeding supplement (VITAL HIGH PROTEIN) liquid 1,000 mL  1,000 mL Per Tube Continuous Kerin Perna, MD 50 mL/hr at 11/24/15 1100 1,000 mL at 11/24/15 1100  . free water 200 mL  200 mL Per Tube Q4H Dolores Patty, MD   200 mL at 11/24/15 1151  . hydrALAZINE (APRESOLINE) tablet 25 mg  25 mg Oral 3 times per day Kerin Perna, MD   25 mg at 11/24/15 0435  . levalbuterol (XOPENEX) nebulizer solution 1.25 mg  1.25 mg Nebulization TID Kerin Perna, MD   1.25 mg at 11/24/15 1334  . levalbuterol (XOPENEX) nebulizer solution 1.25 mg  1.25 mg Nebulization Q6H PRN Kerin Perna, MD   1.25 mg at 11/23/15 1655  . ondansetron (ZOFRAN) injection 4 mg  4 mg Intravenous Q6H PRN Erin R Barrett, PA-C   4 mg at 11/24/15 0456  . oxyCODONE-acetaminophen (PERCOCET/ROXICET) 5-325 MG per tablet 1 tablet  1 tablet Oral Q4H PRN Vilinda Blanks Minor, NP       And  . oxyCODONE (Oxy IR/ROXICODONE) immediate release tablet 5 mg  5 mg Oral Q3H PRN Vilinda Blanks Minor, NP      . pantoprazole sodium (PROTONIX) 40 mg/20 mL oral suspension 40 mg  40 mg Per Tube Daily Kerin Perna, MD   40 mg at 11/24/15 0914  . potassium chloride 20 MEQ/15ML (10%) solution 40 mEq  40 mEq Oral 3 times per day Primitivo Gauze, MD   40 mEq at 11/24/15 0435  . QUEtiapine (SEROQUEL) tablet 50 mg  50 mg Oral QHS Kerin Perna, MD   50 mg at 11/23/15 2045  . sildenafil (REVATIO) tablet 40 mg  40 mg Oral TID Dolores Patty, MD   40 mg at 11/24/15 0917  . silver sulfADIAZINE (SILVADENE) 1 % cream   Topical BID Duayne Cal, NP      . sodium chloride (OCEAN) 0.65 % nasal spray 1 spray  1 spray Each Nare PRN Ardelle Balls, PA-C      . sodium chloride flush (NS) 0.9 % injection 10-40 mL  10-40 mL Intracatheter Q12H Kerin Perna, MD   10 mL at  11/24/15 0937  . sodium chloride flush (NS) 0.9 % injection 10-40 mL  10-40 mL Intracatheter PRN Kerin Perna, MD      . torsemide [facility]) tablet 60 mg  60 mg Oral Daily Dolores Patty, MD   60 mg at 11/24/15 0917  . traMADol (ULTRAM) tablet 50-100 mg  50-100 mg Oral Q4H PRN Erin R Barrett, PA-C   50 mg at 11/16/15 1607  .  triamcinolone cream (KENALOG) 0.5 %   Topical BID Kerin Perna, MD         Discharge Medications: Please see discharge summary for a list of discharge medications.  Relevant Imaging Results:  Relevant Lab Results:   Additional Information  (SSN: [id number])  Orson Gear, Student-SW [phone]

## 2015-11-24 NOTE — Clinical Social Work Note (Signed)
Clinical Social Work Assessment  Patient Details  Name: Christopher [name] MRN: [id number] Date of Birth: [birth year]  Date of referral:  11/24/15               Reason for consult:  Facility Placement, Discharge Planning                Permission sought to share information with:  Family Supports Permission granted to share information::  Yes, Verbal Permission Granted  Name::        Agency::   (SNF)  Relationship::     Contact Information:     Housing/Transportation Living arrangements for the past 2 months:  Single Family Home Source of Information:  Patient Patient Interpreter Needed:  None Criminal Activity/Legal Involvement Pertinent to Current Situation/Hospitalization:  No - Comment as needed Significant Relationships:  Significant Other Lives with:  Significant Other Do you feel safe going back to the place where you live?  No Need for family participation in patient care:  Yes (Comment)  Care giving concerns:   Patient has not expressed any care giving concerns at this time.   Social Worker assessment / plan:   Patient a/o x4. BSW intern has spoken with patient at bedside, in reference to discharge disposition. BSW intern has presented patient with SNF list as a backup plan in the event that CIR is unable to accept. BSW intern has thoroughly explained SNF process as well as insurance coverage. Patient was agreeable to having clinical faxed to SNF within [facility]. Patient informed BSW intern that he lives at home with significant other. Therefore, frequent supervision will be provided. Per patient's bedside nurse, significant other is of very strong supports and has been at bedside all morning. BSW intern to fax patient's clinical to SNF for review. BSW intern to f/u with patient in reference to extended bed offers once feedback has been received. Patient's scheduled d/c is unknown at this time. BSW intern to continue facilitating discharge placement.   Employment  status:  Unemployed Health and safety inspector:    PT Recommendations:  Inpatient Rehab Consult Information / Referral to community resources:  Skilled Nursing Facility  Patient/Family's Response to care:   Patient's demeanor seemed to be very flat and nonchalant. However, patient was agreeable to d/c to SNF, if not accepted by CIR.   Patient/Family's Understanding of and Emotional Response to Diagnosis, Current Treatment, and Prognosis:   Patient understood that rehab is short- term only and has high hopes of returning back to private residence once completed.  Emotional Assessment Appearance:  Appears stated age Attitude/Demeanor/Rapport:   (pleasant) Affect (typically observed):  Flat, Calm, Accepting Orientation:  Oriented to Self, Oriented to Place, Oriented to  Time, Oriented to Situation Alcohol / Substance use:  Not Applicable Psych involvement (Current and /or in the community):  No (Comment)  Discharge Needs  Concerns to be addressed:  No discharge needs identified Readmission within the last 30 days:  No Current discharge risk:  None Barriers to Discharge:  No Barriers Identified   Orson Gear, Student-SW 11/24/2015, 2:27 PM

## 2015-11-24 NOTE — Progress Notes (Addendum)
      [address]       [place] [id number]             [phone]        22 Days Post-Op Procedure(s) (LRB): Right/Left Heart Cath and Coronary/Graft Angiography (N/A) Intra-Aortic Balloon Pump Insertion  Subjective: Patient with "dry nose"  Objective: Vital signs in last 24 hours: Temp:  [97.6 F (36.4 C)-98.5 F (36.9 C)] 97.6 F (36.4 C) (04/18 0700) Pulse Rate:  [41-84] 41 (04/18 0812) Cardiac Rhythm:  [-] Normal sinus rhythm (04/18 0821) Resp:  [15-27] 21 (04/18 0812) BP: (101-141)/(66-87) 122/76 mmHg (04/18 0812) SpO2:  [93 %-100 %] 100 % (04/18 0821) FiO2 (%):  [28 %] 28 % (04/18 0323) Weight:  [228 lb 9.9 oz (103.7 kg)] 228 lb 9.9 oz (103.7 kg) (04/18 0450)  Current Weight  11/24/15 228 lb 9.9 oz (103.7 kg)    Hemodynamic parameters for last 24 hours: CVP:  [8 mmHg-18 mmHg] 16 mmHg  Intake/Output from previous day: 04/17 0701 - 04/18 0700 In: 2153 [I.V.:203; NG/GT:1950] Out: 2775 [Urine:2775]   Physical Exam:  Cardiovascular: RRR, no murmurs, gallops, or rubs. Pulmonary: Clear to auscultation bilaterally; no rales, wheezes, or rhonchi. Abdomen: Soft, non tender, bowel sounds present. Extremities: Mild bilateral lower extremity edema. Wounds: Clean and dry.  No erythema or signs of infection.  Lab Results: CBC: Recent Labs  11/23/15 0410 11/24/15 0515  WBC 8.5 8.6  HGB 10.8* 10.9*  HCT 35.4* 36.0*  PLT 243 226   BMET:  Recent Labs  11/23/15 0410 11/24/15 0515  NA 143 141  K 4.2 5.0  CL 105 103  CO2 27 28  GLUCOSE 101* 104*  BUN 49* 48*  CREATININE 1.62* 1.51*  CALCIUM 8.6* 8.7*    PT/INR:  Lab Results  Component Value Date   INR 1.44 11/11/2015   INR 1.82* 10/29/2015   INR 1.59* 10/27/2015   ABG:  INR: Will add last result for INR, ABG once components are confirmed Will add last 4 CBG results once components are confirmed  Assessment/Plan:  1. CV - Previous a flutterHad brief SB 41 earlier this am?SR  in the 80's at time of exam. On Amiodarone 200 mg bid, Sildenafil 40 mg tid, and Hydralazine 25 mg tid. 2.  Pulmonary - Trach collar/PM valve. CXR shows left pleural effusion and atelectasis.  3. Volume Overload - On Demadex 60 mg daily 4.  Acute blood loss anemia - H and H stable at 10.9 and 36 5. GI-on TFs. Swallow study in am. 6.AKI-creatinine down from 1.62 to 1.51 7. CBGs 94/98/90. Pre op HGA1C 6.2. On scheduled Insulin. He is likely pre diabetic. Will stop scheduled Insulin and change accu checks to Q 6. Will discuss with Dr. Donata Clay, when able to stop accu checks 8. Ocean nasal spray for dry nares 9.Deconditioning-continue with PT  ZIMMERMAN,DONIELLE MPA-C 11/24/2015,10:27 AM  Repeat swallow test by SLT wed PA and lateral chest x-ray pressure reviewed-I feel that the left pleural effusion is probably small with some elevation of left hemidiaphragm We will discontinue Accu checks Do not plan to downsize tracheostomy patient still has significant airway secretions.  patient examined and medical record reviewed,agree with above note. Kathlee Nations Trigt III 11/24/2015

## 2015-11-24 NOTE — Progress Notes (Signed)
Pacing wires pulled at bedside by Dr Donata Clay, pt tolerated well. Marisue Ivan RN

## 2015-11-24 NOTE — Progress Notes (Signed)
CARDIAC REHAB PHASE I   PRE:  Rate/Rhythm: 80 SR  BP:  Sitting: 117/72        SaO2: 96 RA  MODE:  Ambulation: 300 ft   POST:  Rate/Rhythm: 87 SR  BP:  Sitting: 122/76         SaO2: 89 RA (bad waveform), 97 RA with portable pulse ox  Pt up in chair, eager to walk, stood independently. Pt ambulated 300 ft on RA, rolling walker, gait belt, assist x2 (for equipment/safety), steady gait, tolerated well. Pt c/o mild-moderate DOE (actually seems improved since before surgery), mild fatigue towards end of walk, declined rest stop. Pt sats reading 87% on RA upon return to room, however, noted poor waveform on monitor. Sats checked with portable pulse ox, 97 % on RA. Encouraged ambulation x2 more today. Pt verbalized understanding. Pt to recliner after walk, call bell within reach. Will follow. Pt very independent, min assist, can be x1.   [phone] Joylene Grapes, RN, BSN 11/24/2015 8:17 AM

## 2015-11-24 NOTE — Progress Notes (Signed)
Patient ID: Christopher [name], male   DOB: 15-Jan-1961, 55 y.o.   MRN: [id number]   ADVANCED HF ROUNDING NOTE  Subjective:  Has PM valve. Failed swallow study 11/21/15.  Planning repeat on Wed, 11/25/15.  Continues to progress with PT. Plan is for CIR. Feeling good today.  Continues to pee well on torsemide. Denies CP, lightheadedness, or dizziness. Said he had a little bit of SOB last night, but none since.   CVP up slightly at 13 from 10 yesterday.   CXR 11/23/15 stable with left basilar opacity consistet with atelectasis or infiltrate with associated pleural effusion. (Stable from 11/21/15). Repeat today pending.   Scheduled Meds: . amiodarone  200 mg Oral BID  . antiseptic oral rinse  7 mL Mouth Rinse q12n4p  . aspirin  81 mg Oral Daily  . atorvastatin  80 mg Oral q1800  . chlorhexidine  15 mL Mouth Rinse BID  . enoxaparin (LOVENOX) injection  40 mg Subcutaneous Q24H  . feeding supplement (PRO-STAT SUGAR FREE 64)  30 mL Per Tube TID  . free water  200 mL Per Tube Q4H  . hydrALAZINE  25 mg Oral 3 times per day  . insulin aspart  0-24 Units Subcutaneous 6 times per day  . insulin detemir  12 Units Subcutaneous BID  . levalbuterol  1.25 mg Nebulization TID  . pantoprazole sodium  40 mg Per Tube Daily  . potassium chloride  40 mEq Oral 3 times per day  . QUEtiapine  50 mg Oral QHS  . sildenafil  40 mg Oral TID  . silver sulfADIAZINE   Topical BID  . sodium chloride flush  10-40 mL Intracatheter Q12H  . torsemide  60 mg Oral Daily  . triamcinolone cream   Topical BID   Continuous Infusions: . sodium chloride Stopped (11/15/15 0600)  . dextrose 10 mL/hr at 11/23/15 1900  . feeding supplement (VITAL HIGH PROTEIN) 1,000 mL (11/24/15 0500)   PRN Meds:.diphenhydrAMINE, fentaNYL (SUBLIMAZE) injection, levalbuterol, midazolam, ondansetron (ZOFRAN) IV, sodium chloride flush, traMADol    Filed Vitals:   11/23/15 2351 11/24/15 0308 11/24/15 0323 11/24/15 0450  BP: 102/66  141/74     Pulse: 81  75   Temp:  98 F (36.7 C)    TempSrc:  Oral    Resp: 20  18   Height:      Weight:    228 lb 9.9 oz (103.7 kg)  SpO2: 95%  95%     Intake/Output Summary (Last 24 hours) at 11/24/15 0744 Last data filed at 11/24/15 0500  Gross per 24 hour  Intake   2093 ml  Output   2775 ml  Net   -682 ml    LABS: Basic Metabolic Panel:  Recent Labs  24/26/83 0534 11/23/15 0410 11/24/15 0515  NA 145 143 141  K 4.8 4.2 5.0  CL 110 105 103  CO2 26 27 28   GLUCOSE 115* 101* 104*  BUN 51* 49* 48*  CREATININE 1.64* 1.62* 1.51*  CALCIUM 8.7* 8.6* 8.7*  MG 2.0 2.0  --    Liver Function Tests: No results for input(s): AST, ALT, ALKPHOS, BILITOT, PROT, ALBUMIN in the last 72 hours. No results for input(s): LIPASE, AMYLASE in the last 72 hours. CBC:  Recent Labs  11/23/15 0410 11/24/15 0515  WBC 8.5 8.6  HGB 10.8* 10.9*  HCT 35.4* 36.0*  MCV 90.3 90.0  PLT 243 226   Cardiac Enzymes: No results for input(s): CKTOTAL, CKMB, CKMBINDEX, TROPONINI in the last  72 hours. BNP: Invalid input(s): POCBNP D-Dimer: No results for input(s): DDIMER in the last 72 hours. Hemoglobin A1C: No results for input(s): HGBA1C in the last 72 hours. Fasting Lipid Panel: No results for input(s): CHOL, HDL, LDLCALC, TRIG, CHOLHDL, LDLDIRECT in the last 72 hours. Thyroid Function Tests: No results for input(s): TSH, T4TOTAL, T3FREE, THYROIDAB in the last 72 hours.  Invalid input(s): FREET3 Anemia Panel: No results for input(s): VITAMINB12, FOLATE, FERRITIN, TIBC, IRON, RETICCTPCT in the last 72 hours.  RADIOLOGY: Dg Abd 1 View  11/03/2015  CLINICAL DATA:  Enteric tube advanced to post pyloric position. EXAM: ABDOMEN - 1 VIEW COMPARISON:  11/02/2015 abdominal radiographs. FINDINGS: Fluoroscopy time 7 minutes. Enteric tube courses through the stomach and duodenum and appears to terminate near the duodenal jejunal junction. Instilled contrast opacifies nondilated proximal jejunal small bowel  loops. No contrast leak is demonstrated. Separate enteric tube terminates in the body of the stomach. Visualized lower sternotomy wires appear aligned and intact. IMPRESSION: Enteric tube appears to terminate near the duodenal jejunal junction. Separate enteric tube terminates in the body of the stomach. Electronically Signed   By: Delbert Phenix M.D.   On: 11/03/2015 17:02   Dg Chest Port 1 View  11/23/2015  CLINICAL DATA:  Heart failure. EXAM: PORTABLE CHEST 1 VIEW COMPARISON:  November 21, 2015. FINDINGS: Stable cardiomegaly. Status post Coronary artery bypass graft. Tracheostomy tube and right-sided PICC line are unchanged in position. No pneumothorax is noted. Stable left basilar opacity is noted concerning for infiltrate or atelectasis with associated pleural effusion. Mild central pulmonary vascular congestion is noted. Bony thorax is unremarkable. IMPRESSION: Stable support apparatus. Stable cardiomegaly. Stable left basilar opacity is noted consistent with atelectasis or infiltrate with associated pleural effusion. Electronically Signed   By: Lupita Raider, M.D.   On: 11/23/2015 10:12   Dg Chest Port 1 View  11/21/2015  CLINICAL DATA:  55 year old male with admission 10/20/2015 for shortness of breath and coronary artery disease. Combined carotid endarterectomy and CABG performed 10/27/2015. EXAM: PORTABLE CHEST 1 VIEW COMPARISON:  Multiple prior, most recent 11/20/2015 FINDINGS: Surgical changes of median sternotomy and CABG. Unchanged right upper extremity PICC. Unchanged tracheostomy. Unchanged left subclavian central venous catheter. Improved aeration on the left upper lung with persisting left basilar opacity obscuring the left hemidiaphragm and the left heart border. Persisting interstitial and airspace disease at the right base. No visualized pneumothorax. IMPRESSION: Improving aeration on the left, with persisting basilar opacity, potentially combination of pleural fluid, atelectasis/  consolidation, and/ or edema. Mixed airspace disease at the right base, potentially atelectasis or consolidation. Unchanged support apparatus as above. Signed, Yvone Neu. Loreta Ave, DO [facility] Electronically Signed   By: Gilmer Mor D.O.   On: 11/21/2015 09:40   Dg Chest Port 1 View  11/20/2015  CLINICAL DATA:  Shortness of breath. EXAM: PORTABLE CHEST 1 VIEW COMPARISON:  11/18/2015 . FINDINGS: Tracheostomy tube, right PICC line, feeding tube in stable position. Prior CABG. Cardiomegaly. Worsening left lung infiltrate. Worsening left pleural effusion. Interim partial clearing of right base atelectasis and or infiltrate . No pneumothorax. IMPRESSION: 1. Lines and tubes in stable position. 2. Progressive left lung infiltrate consistent with pneumonia. Progressive left pleural effusion. 3. Interim partial clearing of right base atelectasis and/or infiltrate. 4.  Prior CABG.  Stable severe cardiomegaly. Electronically Signed   By: Maisie Fus  Register   On: 11/20/2015 07:36   Dg Chest Port 1 View  11/18/2015  CLINICAL DATA:  Respiratory  failure. EXAM: PORTABLE CHEST 1 VIEW COMPARISON:  11/17/2015. FINDINGS: Tracheostomy tube, feeding tube, right PICC line, left subclavian line in stable position. Prior CABG. Stable cardiomegaly. Persistent left lower lobe infiltrate/edema again noted. Right base atelectasis and/or infiltrate/edema noted. Small left pleural effusion . These changes may be related to bibasilar pneumonia and/or congestive heart failure . The thorax. IMPRESSION: 1. Lines and tubes in stable position . 2.  Prior CABG.  Persistent cardiomegaly. 3. Persistent left lower lobe infiltrate and small left pleural effusion. Developing right lower lobe infiltrate. These changes may be related congestive heart failure with pulmonary edema. Bilateral pneumonia could also present in this fashion. Electronically Signed   By: Maisie Fus  Register   On: 11/18/2015  07:41   Dg Chest Port 1 View  11/17/2015  CLINICAL DATA:  Shortness of breath. EXAM: PORTABLE CHEST 1 VIEW COMPARISON:  11/16/2015. FINDINGS: Tracheostomy tube, feeding tube, right PICC line, left subclavian central line in stable position. Prior CABG. Cardiomegaly. Left lower lobe infiltrate a small left pleural effusion. Mild right base infiltrate. No pneumothorax. IMPRESSION: 1.  Lines and tubes in stable position. 2. Persistent left lower lobe infiltrate and left pleural effusion. No interim change. Mild right lower lobe infiltrate. No change from prior exam . 3.  Stable cardiomegaly. Electronically Signed   By: Maisie Fus  Register   On: 11/17/2015 07:19   Dg Chest Port 1 View  11/16/2015  CLINICAL DATA:  Follow-up respiratory failure EXAM: PORTABLE CHEST 1 VIEW COMPARISON:  11/15/2014 FINDINGS: Prior CABG. Tracheostomy tube, right PICC line and left subclavian central line remain in place, unchanged. There is cardiomegaly with vascular congestion. Continued left lower lobe atelectasis or pneumonia and suspect small left pleural effusion. No significant change since prior study. No confluent opacity on the right. IMPRESSION: Stable left lower lobe atelectasis or pneumonia with small left effusion. Stable cardiomegaly with vascular congestion. Electronically Signed   By: Charlett Nose M.D.   On: 11/16/2015 07:27   Dg Chest Port 1 View  11/15/2015  CLINICAL DATA:  Chronic ventilator dependent respiratory failure. CABG and right carotid endarterectomy 10/27/2015. EXAM: PORTABLE CHEST 1 VIEW COMPARISON:  11/14/2015 and earlier. FINDINGS: Tracheostomy tube tip in satisfactory position below the thoracic inlet. Left subclavian central venous catheter tip projects over the mid SVC, unchanged. Right arm PICC tip projects over lower SVC at or near the cavoatrial junction, unchanged. Feeding tube courses below the diaphragm into the stomach. Sternotomy for CABG. Cardiac silhouette moderately enlarged. Mild pulmonary  venous hypertension without overt edema, unchanged. Dense consolidation in the left lower lobe with air bronchograms, unchanged. Moderate-sized left pleural effusion, unchanged. No new pulmonary parenchymal abnormalities. IMPRESSION: 1.  Support apparatus satisfactory. 2. Stable dense left lower lobe atelectasis and/or pneumonia and left pleural effusion. 3. Stable cardiomegaly and pulmonary venous hypertension without overt edema. 4. No new abnormalities. Electronically Signed   By: Hulan Saas M.D.   On: 11/15/2015 07:22   Dg Chest Port 1 View  11/14/2015  CLINICAL DATA:  55 year old male with a history of shortness of breath. Patient admission 10/20/2015. Right carotid endarterectomy 10/27/2015, with same-day coronary artery bypass grafting 10/27/2015. Twelve days postop now with history of intra-aortic balloon pump. EXAM: PORTABLE CHEST 1 VIEW COMPARISON:  11/13/2015, 11/12/2015, 11/11/2015 FINDINGS: Cardiomediastinal silhouette likely unchanged, with the left heart border partially obscured by overlying lung and pleural disease. Low lung volumes persist with bibasilar opacities and obscuration of the retrocardiac region. Obscuration of left hemidiaphragm. Unchanged tracheostomy tube, enteric feeding tube terminating out of  the field of view, right upper extremity PICC and left subclavian central line. IMPRESSION: Similar appearance of the chest x-ray, with persisting bibasilar airspace opacities and left pleural effusion. Unchanged support apparatus as above. Signed, Yvone Neu. Loreta Ave, DO [facility] Electronically Signed   By: Gilmer Mor D.O.   On: 11/14/2015 08:48   Dg Chest Port 1 View  11/13/2015  CLINICAL DATA:  Shortness of breath. EXAM: PORTABLE CHEST 1 VIEW COMPARISON:  November 12, 2015. FINDINGS: Stable cardiomegaly. Status post coronary bypass graft. Tracheostomy and feeding tube are unchanged. Bilateral subclavian catheters are  unchanged. No pneumothorax is noted. Stable bibasilar opacities are noted with left greater than right. Stable left pleural effusion is noted. Bony thorax is unremarkable. IMPRESSION: Stable support apparatus. Stable bibasilar opacities. Stable left pleural effusion. Electronically Signed   By: Lupita Raider, M.D.   On: 11/13/2015 07:37   Dg Chest Port 1 View  11/12/2015  CLINICAL DATA:  Respiratory failure. EXAM: PORTABLE CHEST 1 VIEW COMPARISON:  11/11/2015. FINDINGS: Tracheostomy tube, feeding tube, left IJ line, right PICC line in stable position . Prior CABG. Cardiomegaly with diffuse bilateral pulmonary infiltrates consistent pulmonary edema. Progressed from prior exam. Small bilateral pleural effusions. No pneumothorax. IMPRESSION: 1. Lines and tubes in stable position. 2. Prior CABG. Cardiomegaly with progressive bilateral pulmonary infiltrates consistent with pulmonary edema. Small bilateral pleural effusions are noted. Electronically Signed   By: Maisie Fus  Register   On: 11/12/2015 07:46   Dg Chest Port 1 View  11/11/2015  CLINICAL DATA:  Tracheostomy placement EXAM: PORTABLE CHEST 1 VIEW COMPARISON:  11/11/2015 FINDINGS: Tracheostomy tube has been placed with the tip projecting over the mid trachea. No pneumothorax. Right PICC line remains in place, unchanged as does left central line. Cardiomegaly with bilateral perihilar and lower lobe opacities, likely edema. Suspect layering effusions. IMPRESSION: Interval placement tracheostomy tube without pneumothorax. Suspected CHF. Bilateral effusions. Findings similar to prior study. Electronically Signed   By: Charlett Nose M.D.   On: 11/11/2015 14:50   Dg Chest Port 1 View  11/11/2015  CLINICAL DATA:  Respiratory failure. EXAM: PORTABLE CHEST 1 VIEW COMPARISON:  11/10/2015. FINDINGS: Endotracheal tube, feeding tube, bilateral subclavian lines in stable position. Prior CABG. Cardiomegaly with diffuse bilateral pulmonary infiltrates consistent with  pulmonary edema. Worsening from prior exam. Small left pleural effusion. No pneumothorax. IMPRESSION: 1.  Lines and tubes in stable position. 2. Prior CABG. Cardiomegaly with worsening bilateral pulmonary edema. Small left pleural effusion. Electronically Signed   By: Maisie Fus  Register   On: 11/11/2015 07:17   Dg Chest Port 1 View  11/10/2015  CLINICAL DATA:  Shortness of breath . EXAM: PORTABLE CHEST 1 VIEW COMPARISON:  11/09/2015. FINDINGS: Endotracheal tube, NG tube, right PICC line, left subclavian line in stable position. Prior CABG. Cardiomegaly. Left lower lobe atelectasis and/or infiltrate with small left pleural effusion. Mild right base atelectasis and or infiltrate. No pneumothorax . IMPRESSION: 1.  Lines and tubes in stable position. 2. Left lower lobe atelectasis and/or infiltrate with small left pleural effusion. Mild right base atelectasis and/or infiltrate. Findings stable from prior exam. 3.  Prior CABG.  Stable cardiomegaly. Electronically Signed   By: Maisie Fus  Register   On: 11/10/2015 07:51   Dg Chest Port 1 View  11/09/2015  CLINICAL DATA:  Atelectasis. EXAM: PORTABLE CHEST 1 VIEW COMPARISON:  11/08/2015 FINDINGS: Endotracheal tube in satisfactory position. Right arm PICC tip in the lower SVC unchanged. Left subclavian central venous catheter in the  SVC. NG tube and feeding tube enters the stomach with the tips not visualized. Negative for pneumothorax. Left lower lobe airspace disease unchanged. Small left effusion unchanged. Mild pulmonary vascular congestion unchanged. IMPRESSION: Support lines remain in satisfactory position and unchanged Left lower lobe atelectasis/ infiltrate unchanged. Mild right lower lobe airspace consolidation stable. Mild vascular congestion unchanged. Electronically Signed   By: Marlan Palau M.D.   On: 11/09/2015 07:36   Dg Chest Port 1 View  11/08/2015  CLINICAL DATA:  Respiratory failure, hypertension, coronary artery disease post MI and CABG EXAM: PORTABLE  CHEST 1 VIEW COMPARISON:  Portable exam 1216 hours compared to 11/06/2015 FINDINGS: Tip of endotracheal tube projects 6.8 cm above carina. Nasogastric tube and feeding tube extends into stomach. LEFT subclavian central venous catheter with tip projecting over SVC. RIGHT arm PICC line tip projects over SVC. Enlargement of cardiac silhouette post CABG. Stable mediastinal contours. Increased LEFT lower lobe consolidation. Slightly increased infiltrate at RIGHT base as well. Minimal central peribronchial thickening. No pleural effusion or pneumothorax. IMPRESSION: Enlargement of cardiac silhouette post CABG. Stable line and tube positions. Increased LEFT lower lobe consolidation with minimally increased infiltrate at RIGHT base as well. Findings are suspicious for pneumonia involving the LEFT lower lobe though a component of coexisting pulmonary edema may be present. Electronically Signed   By: Ulyses Southward M.D.   On: 11/08/2015 12:47   Dg Chest Port 1 View  11/06/2015  CLINICAL DATA:  Acute CHF secondary to acute MI, complete heart block. EXAM: PORTABLE CHEST 1 VIEW COMPARISON:  Portable chest x-ray of November 05, 2015 FINDINGS: The lungs are adequately inflated. The pulmonary interstitial markings are less prominent on the right today. They have improved on the left as well and the hemidiaphragm is now visible. There is persistent interstitial edema on the left. The cardiac silhouette remains enlarged. The intraaortic balloon pump marker tip projects over the proximal portion of descending thoracic aorta and appears stable. The endotracheal tube tip projects 5.5 cm above the carina. The nasogastric and Doppler AH feeding tubes have their tips below the inferior margin of the image. The right-sided PICC line tip projects over the junction of the middle and distal thirds of the SVC. The left subclavian venous catheter tip projects over the midportion of the SVC. IMPRESSION: Decreasing pulmonary edema consistent with  improvement in CHF. Persistent perihilar interstitial edema on the left. The support tubes and devices are in reasonable position. Electronically Signed   By: David  [place] M.D.   On: 11/06/2015 07:32   Dg Chest Port 1 View  11/05/2015  CLINICAL DATA:  Respiratory failure. EXAM: PORTABLE CHEST 1 VIEW COMPARISON:  11/04/2015 . FINDINGS: Endotracheal tube, NG tube, right PICC line stable position. Prior CABG. Cardiomegaly. Persistent left mid and lower lung field infiltrate. Persistent mild atelectatic changes right upper and right lower lobes. No pneumothorax. IMPRESSION: 1.  Lines and tubes in stable position. 2. Persistent left mid and left lower lung infiltrate, no interim change. Persistent right upper lobe and right lower lobe subsegmental atelectasis. No interim change. 3. Prior CABG.  Stable cardiomegaly. Electronically Signed   By: Maisie Fus  Register   On: 11/05/2015 07:48   Dg Chest Port 1 View  11/04/2015  CLINICAL DATA:  Hypoxia EXAM: PORTABLE CHEST 1 VIEW COMPARISON:  November 04, 2015 FINDINGS: Endotracheal tube tip is 5.3 cm above the carina. Central catheter tip is in the superior vena cava near the cavoatrial junction. Nasogastric tube tip and side port are below the diaphragm.  There also is a feeding tube with the tip below the diaphragm. No pneumothorax. There has been removal of a left chest tube. There is airspace consolidation throughout the left mid and lower lung zones, stable. There is mild atelectasis in the right upper lobe, stable. No new opacity is evident. There is stable cardiomegaly. The pulmonary vascularity is normal. No adenopathy is evident. IMPRESSION: Tube and catheter positions as described without pneumothorax. Airspace consolidation throughout the left mid and lower lung zones, stable. Atelectasis right upper lobe, stable. No new opacity. Stable cardiomegaly. Electronically Signed   By: Bretta Bang III M.D.   On: 11/04/2015 14:52   Dg Chest Port 1 View  11/04/2015   CLINICAL DATA:  Status post CABG 10/28/2015. EXAM: PORTABLE CHEST 1 VIEW COMPARISON:  Single view of the chest 11/03/2015 and 11/02/2015. FINDINGS: Support tubes and lines including a left chest tube are unchanged since the most recent exam. The chest is better expanded today with decreased atelectasis. Airspace opacity in the left mid and lower lung zones persists. There is likely a left pleural effusion. Marked cardiomegaly is seen with only mild vascular congestion noted. IMPRESSION: Increased pulmonary expansion with decreased scattered atelectasis. Left mid and lower lung zone airspace disease could be due to atelectasis or pneumonia. Small left pleural effusion noted. Support tubes and lines projecting good position.  No pneumothorax. Electronically Signed   By: Drusilla Kanner M.D.   On: 11/04/2015 07:30   Dg Chest Port 1 View  11/03/2015  CLINICAL DATA:  Central line placement. EXAM: PORTABLE CHEST 1 VIEW COMPARISON:  11/03/2015. FINDINGS: Left subclavian central line noted with tip at the cavoatrial junction. Interval removal of right chest tube. Endotracheal tube, NG tube, right PICC line, left IJ sheath left chest tube in stable position. No pneumothorax. Prior CABG. Stable cardiomegaly. Low lung volumes. A mild component of congestive heart failure cannot be excluded. Similar findings noted on prior exam P IMPRESSION: 1. Interim placement of left subclavian central line, its tip is at the cavoatrial junction. Interim removal right chest tube . No pneumothorax. 2. Remaining lines and tubes including left chest tube in stable position. 3. Prior CABG. Stable cardiomegaly. Low lung volumes. A a mild component congestive heart failure cannot be excluded . Similar findings noted on prior exam. Electronically Signed   By: Maisie Fus  Register   On: 11/03/2015 09:58   Dg Chest Port 1 View  11/03/2015  CLINICAL DATA:  Status post CABG on October 27, 2015 persistent left lower lobe atelectasis or pneumonia. No  pneumothorax or significant pleural effusion. EXAM: PORTABLE CHEST 1 VIEW COMPARISON:  Portable chest x-ray of November 02, 2015 at 5:48 a.m. FINDINGS: There has been interval placement of an intra aortic balloon pump whose marker lies over the junction of the aortic arch with the proximal descending thoracic aorta. The cardiac silhouette remains enlarged. The pulmonary vascularity remains mildly prominent. The retrocardiac region on the left remains dense. Bilateral chest tubes are in stable position. There is no pneumothorax nor significant pleural effusion. The endotracheal tube tip lies approximately 6.5 cm above the carina. The esophagogastric tube tip projects below the inferior margin of the image. The right-sided PICC line tip projects over the midportion of the SVC. A previously demonstrated pericardial drain is not clearly evident on today's study. IMPRESSION: Mild CHF. Persistent left lower lobe atelectasis or pneumonia. Interval placement of an intra aortic balloon pump which appears to be in appropriate position radiographically. The other support tubes and lines are in  reasonable position. Electronically Signed   By: David  [place] M.D.   On: 11/03/2015 07:27   Dg Chest Port 1 View  11/02/2015  CLINICAL DATA:  CABG. EXAM: PORTABLE CHEST 1 VIEW COMPARISON:  11/01/2015. FINDINGS: Endotracheal tube, NG tube, right PICC line, bilateral chest tubes in stable position. Prior CABG. Stable cardiomegaly . Persistent mild interstitial prominence, congestive heart failure cannot be excluded. No interim change. Low lung volumes with basilar atelectasis . IMPRESSION: 1. Endotracheal tube, NG tube, right PICC line, bilateral chest tubes in stable position . No pneumothorax. 2. Prior CABG. Cardiomegaly with pulmonary interstitial prominence and small left pleural effusion consistent with mild congestive heart failure. No interim change from prior exam . Electronically Signed   By: Maisie Fus  Register   On: 11/02/2015  07:20   Dg Chest Port 1 View  11/01/2015  CLINICAL DATA:  Post CABG EXAM: PORTABLE CHEST 1 VIEW COMPARISON:  10/31/2015 FINDINGS: Cardiomediastinal silhouette is stable. Status post CABG. Stable endotracheal and NG tube position. Left chest tube is unchanged in position. Persistent central vascular congestion and mild interstitial prominence bilaterally suspicious for the mild interstitial edema. Left IJ sheath in place. Probable small left pleural effusion left basilar atelectasis or infiltrate. Right arm PICC line is unchanged in position. There is no pneumothorax. IMPRESSION: Status post CABG. Stable support apparatus. Stable left chest tube position. No pneumothorax. Again noted bilateral mild interstitial prominence and perihilar opacities suspicious for mild pulmonary edema. Probable small left pleural effusion left basilar atelectasis or infiltrate. Electronically Signed   By: Natasha Mead M.D.   On: 11/01/2015 09:57   Dg Chest Port 1 View  10/31/2015  CLINICAL DATA:  55 year old male with a history of endotracheal tube placement Patient has undergone coronary artery bypass grafting x4 10/27/2015. Placement of intra aortic balloon pump at this date. Ischemic cardiomyopathy. EXAM: PORTABLE CHEST 1 VIEW COMPARISON:  10/30/2015. FINDINGS: Endotracheal tube terminates approximately 3.4 cm above the carina, unchanged. Gastric tube terminates out of the field of view. Defibrillator pads project over the left and right chest. Bilateral thoracostomy tubes. Right upper extremity PICC. The mediastinal drain not visualized. Intra-aortic balloon pump not visualized. Surgical changes of median sternotomy and CABG. Mixed bilateral interstitial and airspace opacities. Low lung volumes. IMPRESSION: Low lung volumes with mixed bilateral interstitial and airspace opacities, similar to the comparison chest x-ray. Retrocardiac opacity may reflect persistent pleural fluid, and/or atelectasis. Surgical support apparatus appear  unchanged, as above. Intra aortic balloon pump not visualized. Signed, Yvone Neu. Loreta Ave, DO [facility] Radiology Electronically Signed   By: Gilmer Mor D.O.   On: 10/31/2015 07:25   Dg Chest Port 1 View  10/30/2015  CLINICAL DATA:  Post cavity.  Recent CABG. EXAM: PORTABLE CHEST 1 VIEW COMPARISON:  Portable film earlier today. FINDINGS: Overlying support apparatus redemonstrated. Tubes and lines remain stable. Elevated LEFT hemidiaphragm. No definite pneumothorax. Moderate vascular congestion may be increased. Cardiomegaly. IMPRESSION: Slight worsening aeration. Moderate vascular congestion may be increased. Electronically Signed   By: Elsie Stain M.D.   On: 10/30/2015 18:39   Dg Chest Port 1 View  10/30/2015  CLINICAL DATA:  Status post coronary bypass grafting EXAM: PORTABLE CHEST 1 VIEW COMPARISON:  10/30/2015 FINDINGS: Cardiac shadow remains enlarged. Bilateral chest tubes are again seen. A right-sided PICC line and endotracheal tube are noted in satisfactory position. Left jugular sheath is noted. Elevation of left hemidiaphragm is noted with left basilar atelectasis. No pneumothorax is noted. IMPRESSION: Postsurgical change with tubes  and lines as described. Mild left basilar atelectasis. Electronically Signed   By: Alcide Clever M.D.   On: 10/30/2015 07:45   Dg Chest Port 1 View  10/30/2015  CLINICAL DATA:  A chest tube in place EXAM: PORTABLE CHEST 1 VIEW COMPARISON:  10/30/2015 FINDINGS: Postoperative changes in the mediastinum. Endotracheal tube with tip measuring 6.3 cm about the carina. Right PICC catheter with tip over the cavoatrial junction. Left central venous catheter sheath with tip over the left neck consistent location in the internal jugular vein. Bilateral chest tubes. Infiltration or atelectasis in the right lung base. Cardiac enlargement. No significant vascular congestion. No pneumothorax. IMPRESSION: Appliance positioned as  described. Cardiac enlargement with infiltration or atelectasis in the right lung base. Bilateral chest tubes are present but no visualized pneumothorax. Electronically Signed   By: Burman Nieves M.D.   On: 10/30/2015 03:07   Dg Chest Port 1 View  10/30/2015  CLINICAL DATA:  Shortness of breath.  Code blue. EXAM: PORTABLE CHEST 1 VIEW COMPARISON:  10/29/2015 FINDINGS: Postoperative changes in the mediastinum. Bilateral chest tubes are present. Right PICC line with tip over the cavoatrial junction. Left central venous catheter or sheath with tip over the left side of the neck, likely in the left internal jugular vein. Cardiac enlargement without significant vascular congestion. Shallow inspiration with elevation of the left hemidiaphragm. Probable left pleural effusion. No definite pulmonary consolidation. IMPRESSION: Shallow inspiration. Probable left pleural effusion. Cardiac enlargement. Appliances appear in satisfactory position. Electronically Signed   By: Burman Nieves M.D.   On: 10/30/2015 01:18   Dg Chest Port 1 View  10/29/2015  CLINICAL DATA:  Postop CABG 2 days ago. EXAM: PORTABLE CHEST 1 VIEW COMPARISON:  Portable chest x-ray of October 28, 2015 FINDINGS: The trachea and esophagus have been extubated. The cardiac silhouette remains enlarged. The pulmonary vascularity is mildly engorged. The bilateral chest tubes and the mediastinal drain are in stable position. There is no pneumothorax or large pleural effusion. The Swan-Ganz catheter tip overlies a proximal right lower lobe pulmonary artery branch. The mediastinum is widened and accentuated by the hypo inflation. IMPRESSION: Bilateral hypoinflation following extubation of the trachea there is crowding of the pulmonary vascularity. Left basilar atelectasis or less likely pneumonia is more prominent today. Probable small left pleural effusion. The remaining support tubes and lines are in stable position. Electronically Signed   By: David  Swaz[place]and  M.D.   On: 10/29/2015 07:40   Dg Chest Port 1 View  10/28/2015  CLINICAL DATA:  Post CABG, on ventilator, followup portable chest x-ray of 10/28/2015 EXAM: PORTABLE CHEST 1 VIEW COMPARISON:  None. FINDINGS: The tip of the endotracheal tube is approximately 5.2 cm above the carina. Aeration has improved slightly. Atelectasis remains at the lung bases left-greater-than-right. Swan-Ganz catheter tip is in the right lower lobe pulmonary artery and bilateral chest tubes remain. No pneumothorax is seen. Heart size is stable. IMPRESSION: 1. Improved aeration. 2. Bilateral chest tubes.  No pneumothorax. 3. Tip of endotracheal tube approximately 5.2 cm above the carina. Electronically Signed   By: Dwyane Dee M.D.   On: 10/28/2015 08:01   Dg Chest Port 1 View  10/27/2015  CLINICAL DATA:  Status post CABG EXAM: PORTABLE CHEST 1 VIEW COMPARISON:  10/27/2015 FINDINGS: Unchanged tracheostomy tube. Swan-Ganz central venous line is now seen with tip advanced into the right perihilar region. Stable right chest tube with and left chest tube. NG tube again crosses the gastroesophageal junction. Stable mediastinal drain. Enlarged cardiac silhouette.  Limited inspiratory effect with bibasilar opacities likely representing atelectasis. IMPRESSION: Anticipated postoperative appearance with bibasilar atelectasis. Electronically Signed   By: Esperanza Heir M.D.   On: 10/27/2015 19:25   Dg Chest Portable 1 View  10/27/2015  CLINICAL DATA:  Status post coronary bypass grafting EXAM: PORTABLE CHEST 1 VIEW COMPARISON:  10/23/2015 FINDINGS: Cardiac shadow is mildly enlarged. A Swan-Ganz catheter is noted in the right pulmonary outflow tract. Bilateral thoracostomy catheters are seen. No pneumothorax is noted. An endotracheal tube is noted in satisfactory position. Intra-aortic balloon pump is noted over the aortic arch. Mild left basilar atelectasis is seen. Mild central vascular congestion is noted. IMPRESSION: Postoperative changes  with tubes and lines as described above. Mild vascular congestion and left basilar atelectasis. Electronically Signed   By: Alcide Clever M.D.   On: 10/27/2015 17:23   Dg Abd Portable 1v  11/19/2015  CLINICAL DATA:  Encounter for enteric feeding tube placement. EXAM: PORTABLE ABDOMEN - 1 VIEW COMPARISON:  11/18/2015 FINDINGS: Enteric feeding tube tip now projects in the fourth portion of the duodenum, near the ligament of Treitz. There is small bowel dilation which appears increased from the prior radiographs. IMPRESSION: Enteric feeding tube metallic tip now projects in the fourth portion of the duodenum near the ligament of Treitz. Increased small bowel dilation which may reflect an adynamic ileus or partial obstruction this is only partly imaged on this exam. Electronically Signed   By: Amie Portland M.D.   On: 11/19/2015 10:24   Dg Abd Portable 1v  11/18/2015  CLINICAL DATA:  55 year old male feeding tube placement. Initial encounter. EXAM: PORTABLE ABDOMEN - 1 VIEW COMPARISON:  11/16/2015. FINDINGS: Portable AP supine view at 1109 hours. A enteric tube placed, tip at the level of the distal second portion of the duodenum. Epicardial pacer wires remain in place. Increased gaseous distension of large bowel in the abdomen, but no dilated small bowel loops. Mild gaseous distension of the stomach. No definite pneumoperitoneum on this supine view. Stable visualized osseous structures. IMPRESSION: 1. Enteric tube placed, it tip at the mid duodenum. 2. Increased large bowel gas, favor ileus over mechanical obstruction. Electronically Signed   By: Odessa Fleming M.D.   On: 11/18/2015 12:01   Dg Abd Portable 1v  11/16/2015  CLINICAL DATA:  Ileus, acute MI with CHF EXAM: PORTABLE ABDOMEN - 1 VIEW COMPARISON:  Portable abdominal films of November 15, 2015 FINDINGS: There remain small amounts of gas within small bowel loops as well as and large bowel loops. No abnormally distended bowel loops are observed. There is gas in the  stomach. A feeding tube is in place whose tip appears to lie at the duodenal -jejunal junction. IMPRESSION: Fairly stable appearance of the abdomen with moderate amounts of gas within small and large bowel loops consistent with ileus. No free extraluminal gas collections are observed. Electronically Signed   By: David  [place] M.D.   On: 11/16/2015 07:20   Dg Abd Portable 1v  11/15/2015  CLINICAL DATA:  Chronic ventilator dependent respiratory failure. Acute onset of nausea and vomiting. EXAM: PORTABLE ABDOMEN - 1 VIEW COMPARISON:  11/03/2015, 11/02/2015. FINDINGS: Feeding tube tip projects at the expected location of the 4th portion of the duodenum at or near the ligament Treitz. Nonobstructive bowel gas pattern. Gas in normal caliber loops of small bowel throughout the abdomen and pelvis. Gas throughout normal caliber colon from cecum to rectum. No suggestion of free intraperitoneal air. IMPRESSION: 1. Bowel gas pattern indicating a mild generalized ileus.  No evidence of bowel obstruction. 2. Feeding tube tip appropriately positioned in the 4th portion of the duodenum at or near the ligament of Treitz. Electronically Signed   By: Hulan Saas M.D.   On: 11/15/2015 07:26   Dg Abd Portable 1v  11/02/2015  CLINICAL DATA:  Assess feeding tube positioning EXAM: PORTABLE ABDOMEN - 1 VIEW COMPARISON:  None in PACs FINDINGS: The repeated KUB with has less motion artifact. The tip of the feeding tube and nasogastric tube lie in the region of the distal gastric body in the pre-pyloric region. The bowel gas pattern is within the limits of normal. Numerous tubes and lines and electrodes overlie the abdomen. IMPRESSION: The tips of the feeding tube and the esophagogastric tube lie in the region of the distal gastric body in the pre-pyloric region. Electronically Signed   By: David  [place] M.D.   On: 11/02/2015 12:16   Dg Vangie Bicker G Tube Plc W/fl-no Rad  11/03/2015  CLINICAL DATA:  NASO G TUBE PLACEMENT WITH FLUORO  Fluoroscopy was utilized by the requesting physician.  No radiographic interpretation.   Dg Swallowing Func-speech Pathology  11/21/2015  Objective Swallowing Evaluation: Type of Study: MBS-Modified Barium Swallow Study Patient Details Name: Christopher [name] MRN: [id number] Date of Birth: Aug 28, 1960 Today's Date: 11/21/2015 Time: SLP Start Time (ACUTE ONLY): 1129-SLP Stop Time (ACUTE ONLY): 1153 SLP Time Calculation (min) (ACUTE ONLY): 24 min Past Medical History: Past Medical History Diagnosis Date . Hypertension  . TIA (transient ischemic attack)  . Stenosis of right carotid artery  . Complete heart block (HCC) 10/20/2015 Past Surgical History: Past Surgical History Procedure Laterality Date . Arm surgery Right 2003 . Eye surgery   . Laceration repair   . Cardiac catheterization N/A 10/20/2015   Procedure: Left Heart Cath and Coronary Angiography;  Surgeon: Tonny Bollman, MD;  Location: [facility] INVASIVE CV LAB;  Service: Cardiovascular;  Laterality: N/A; . Coronary artery bypass graft N/A 10/27/2015   Procedure: CORONARY ARTERY BYPASS GRAFTING (CABG) x four, using left internal mammary artery and rt leg greater saphenous vein harvested endoscopically;  Surgeon: Kerin Perna, MD;  [facility];  Service: Open Heart Surgery;  Laterality: N/A; . Tee without cardioversion N/A 10/27/2015   Procedure: TRANSESOPHAGEAL ECHOCARDIOGRAM (TEE);  Surgeon: Kerin Perna, MD;  Location: [facility] OR;  Service: Open Heart Surgery;  Laterality: N/A; . Endarterectomy Right 10/27/2015   Procedure: RIGHT ENDARTERECTOMY CAROTID with patch angioplasty using Xenosure bovine pericardium patch;  Surgeon: Fransisco Hertz, MD;  Location: [facility] OR;  Service: Vascular;  Laterality: Right; . Cardiac catheterization N/A 11/02/2015   Procedure: Right/Left Heart Cath and Coronary/Graft Angiography;  Surgeon: Laurey Morale, MD;  Location: [facility] INVASIVE CV LAB;  Service: Cardiovascular;  Laterality: N/A; HPI: 55 y.o. male w/ ischemic cardiomyopathy following MI  s/p CABG & CEA. Post-operative course complicated by arrhythmia and biventricular failure, HCAP, heart block. PMHx HTN, stenosis of R carotid artery, TIA. Intubated 3/21-3/22, re-intubated 3/26 and failed to wean, trach placed 4/6. Subjective: pt alert, anxious for PO Assessment / Plan / Recommendation CHL IP CLINICAL IMPRESSIONS 11/21/2015 Therapy Diagnosis Moderate pharyngeal phase dysphagia;Severe pharyngeal phase dysphagia Clinical Impression Pt reassessed with use of PMSV during MBS. Pt with improved subglottic pressure for swallow function with use of the PMSV however continues to exhibit moderate to severe sensory motor pharygneal dysphagia . Pt with delay in swallow initiation across all consistencies. Isolated puree trials yielded moderate pharyngeal residuals secondary to reduced tongue base retraction and decreased laryngeal elevation.  Deep penetration of honey thick liquids evidenced after the swallow which was impacted by pharygneal residuals from previous puree trials in the valleculae and pyriform sinuses.  This was intermittently sensed when in greater amounts. Pharyngeal residual amount decreased with nectar thick liquids, however airway protection compromised with aspiration before the swallow despite use of chin tuck. Recommend continue NPO execpt for ice chips with further PO trials and extended use of PMSV for improved pharygneal sensation. ST to continue to follow up.   Impact on safety and function Severe aspiration risk   CHL IP TREATMENT RECOMMENDATION 11/21/2015 Treatment Recommendations Therapy as outlined in treatment plan below   Prognosis 11/21/2015 Prognosis for Safe Diet Advancement Good Barriers to Reach Goals Severity of deficits Barriers/Prognosis Comment -- CHL IP DIET RECOMMENDATION 11/21/2015 SLP Diet Recommendations NPO Liquid Administration via -- Medication Administration Via alternative means Compensations -- Postural Changes --   CHL IP OTHER RECOMMENDATIONS 11/21/2015  Recommended Consults -- Oral Care Recommendations Oral care QID Other Recommendations (No Data)   CHL IP FOLLOW UP RECOMMENDATIONS 11/21/2015 Follow up Recommendations Inpatient Rehab   CHL IP FREQUENCY AND DURATION 11/21/2015 Speech Therapy Frequency (ACUTE ONLY) min 2x/week Treatment Duration 2 weeks      CHL IP ORAL PHASE 11/21/2015 Oral Phase WFL Oral - Pudding Teaspoon -- Oral - Pudding Cup -- Oral - Honey Teaspoon -- Oral - Honey Cup -- Oral - Nectar Teaspoon -- Oral - Nectar Cup -- Oral - Nectar Straw -- Oral - Thin Teaspoon -- Oral - Thin Cup -- Oral - Thin Straw -- Oral - Puree -- Oral - Mech Soft -- Oral - Regular -- Oral - Multi-Consistency -- Oral - Pill -- Oral Phase - Comment --  CHL IP PHARYNGEAL PHASE 11/21/2015 Pharyngeal Phase Impaired Pharyngeal- Pudding Teaspoon -- Pharyngeal -- Pharyngeal- Pudding Cup -- Pharyngeal -- Pharyngeal- Honey Teaspoon Reduced tongue base retraction;Reduced laryngeal elevation;Reduced anterior laryngeal mobility;Reduced epiglottic inversion;Delayed swallow initiation-pyriform sinuses;Penetration/Apiration after swallow;Pharyngeal residue - valleculae;Pharyngeal residue - pyriform;Pharyngeal residue - posterior pharnyx;Penetration/Aspiration during swallow Pharyngeal -- Pharyngeal- Honey Cup -- Pharyngeal -- Pharyngeal- Nectar Teaspoon Moderate aspiration;Penetration/Aspiration before swallow;Reduced tongue base retraction;Reduced airway/laryngeal closure;Reduced laryngeal elevation;Reduced epiglottic inversion;Reduced anterior laryngeal mobility;Pharyngeal residue - valleculae;Pharyngeal residue - pyriform;Pharyngeal residue - posterior pharnyx Pharyngeal -- Pharyngeal- Nectar Cup -- Pharyngeal -- Pharyngeal- Nectar Straw -- Pharyngeal -- Pharyngeal- Thin Teaspoon -- Pharyngeal -- Pharyngeal- Thin Cup NT Pharyngeal -- Pharyngeal- Thin Straw -- Pharyngeal -- Pharyngeal- Puree Delayed swallow initiation-vallecula;Pharyngeal residue - valleculae;Pharyngeal residue -  pyriform;Pharyngeal residue - posterior pharnyx Pharyngeal -- Pharyngeal- Mechanical Soft -- Pharyngeal -- Pharyngeal- Regular -- Pharyngeal -- Pharyngeal- Multi-consistency -- Pharyngeal -- Pharyngeal- Pill -- Pharyngeal -- Pharyngeal Comment --  CHL IP CERVICAL ESOPHAGEAL PHASE 11/21/2015 Cervical Esophageal Phase WFL Pudding Teaspoon -- Pudding Cup -- Honey Teaspoon -- Honey Cup -- Nectar Teaspoon -- Nectar Cup -- Nectar Straw -- Thin Teaspoon -- Thin Cup -- Thin Straw -- Puree -- Mechanical Soft -- Regular -- Multi-consistency -- Pill -- Cervical Esophageal Comment -- No flowsheet data found. Marcene Duos MA, CCC-SLP Acute Care Speech Language Pathologist  Kennieth Rad 11/21/2015, 12:49 PM               PHYSICAL EXAM CVP 13 personally checked.  General: Sitting up in chair. Using PM valvle Neck: JVP difficult to assess with pt girth, does appear mildly elevated.  No thyromegaly or thyroid nodule.  Trach Lungs:R lung clear. Bronchial breath sounds left base.  CV: Nondisplaced PMI.  Heart regular S1/S2, no S3/S4,  no murmur.  Abdomen: obese NT, ND, no HSM. No bruits or masses. +BS  Neurologic: alert and oriented x 3 Extremities: No clubbing or cyanosis.  R and LLE unna boot in place. 1+ edema to knees.  TELEMETRY: NSR 60s  ASSESSMENT AND PLAN:  1. CAD: S/p late presentation inferior MI and CABG x 4. Continue statin, ASA. Coronary angiography 3/27 with multiple VT episodes showed patent LIMA-LAD and SVG-RCA.  Patent SVG-ramus and SVG-D but slow flow in both grafts with diffusely diseased small target vessels.  2. Cardiogenic shock: Acute systolic CHF with RV failure post inferior MI with suspected RV involvement. EF 25-30% on echo (difficult images on 3/24 echo). He has significant RV failure from suspected RV infarct.  3/27 IABP placed to try to allow decrease in milrinone with frequent VT.  3/31 echo with EF 30-35%.  IABP out 4/2.  Milrinone off 4/7 - CVP 13 this am. Continue torsemide  60 mg daily for now.  Follow closely with RV failure.  - Continue UNA boots. - No spiro yet with CKD.  - Continue Coreg 3.125 mg bid.   - Continue sildenafil 40 tid.   - Continue hydralazine 25 mg tid for afterload reduction (no Imdur with sildenafil use).  3. Complete heart block: Resolved.  4. VF arrest 3/24 early am and again at night 3/24.  Ectopy quiescent. - Continue po amio - Will need Lifevest at discharge versus ICD => will discuss with EP, had VT 5-6 days after CABG.   5. AKI: Creatinine stable to improved at 1.51 6. ID: Possible LLL PNA.  He completed course of vancomycin/levofloxacin.  7. Pulmonary: S/P Trach 4/5 on trach collar. Has PM valve. Failed swallow study again 4/15. Plan for repeat 11/25/15. 8. Hypernatremia: Improving on D5W. Sodium 141.   Continue torsemide. CVP relatively stable.  9. Atrial flutter: Now in NSR on amio. He was off AC due to trach bleeding. On DVT prophylaxis. Will not start coumadin unless AF/AFL recurs. 10. Deconditioning: continue PT.   Graciella Freer PA-C 11/24/2015 7:44 AM   Advanced Heart Failure Team Pager [phone] (M-F; 7a - 4p)  Please contact [facility] for night-coverage after hours (4p -7a ) and weekends on [email or web address]  Patient seen with PA, agree with the above note.  He is doing well today.  Walked 3 laps.  Mild volume overload, will increase torsemide to 80 mg daily.  Would not start spironolactone with elevated K.    CXR with persistent left basilar airspace disease and small effusion.   Plan for inpatient rehab later today.   [name] 11/24/2015

## 2015-11-24 NOTE — Progress Notes (Signed)
Pt discharged to inpatient rehab. Pt transferred by wheelchair. Marisue Ivan RN

## 2015-11-24 NOTE — Progress Notes (Signed)
PULMONARY / CRITICAL CARE MEDICINE   Name: Christopher [name] MRN: [id number] DOB: 1961/04/25    ADMISSION DATE:  10/20/2015 CONSULTATION DATE:  11/03/2015  REFERRING MD: Dr. Donata Clay / CVTS  CHIEF COMPLAINT:  Acute Hypoxic Respiratory Failure  HISTORY OF PRESENT ILLNESS:   [name] w/ ischemic cardiomyopathy following MI. Patient underwent CABG & CEA. Post-operative course complicated by arrhythmia and biventricular failure, HCAP, heart block. Improving  SUBJECTIVE: Has bloody secretions from trach.  REVIEW OF SYSTEMS:  Feels well. Denies any nausea, vomiting No chest pain, palpitations No dyspnea, cough, wheezing.   VITAL SIGNS: BP 122/76 mmHg  Pulse 41  Temp(Src) 97.6 F (36.4 C) (Axillary)  Resp 21  Ht 5' 10.5" (1.791 m)  Wt 228 lb 9.9 oz (103.7 kg)  BMI 32.33 kg/m2  SpO2 100%  HEMODYNAMICS: CVP:  [8 mmHg-18 mmHg] 16 mmHg  VENTILATOR SETTINGS: Vent Mode:  [-]  FiO2 (%):  [28 %] 28 %  INTAKE / OUTPUT: I/O last 3 completed shifts: In: 3553 [I.V.:803; NG/GT:2750] Out: 3550 [Urine:3550]  PHYSICAL EXAMINATION: General:  Comfortable. Sitting up in chair, walks around unit Neuro:  Movers all 4 extremities. Grossly nonfocal.follows commands HEENT: Trach in place. No JVD, thyomegaly Cardiovascular:  Regular rate. No MRG Lungs:  Clear, no wheezing, crackles Abdomen:  Soft. Protuberant. Normal BS. Nontender. Integument:  No rash  LABS:  BMET  Recent Labs Lab 11/22/15 0534 11/23/15 0410 11/24/15 0515  NA 145 143 141  K 4.8 4.2 5.0  CL 110 105 103  CO2 26 27 28   BUN 51* 49* 48*  CREATININE 1.64* 1.62* 1.51*  GLUCOSE 115* 101* 104*   Electrolytes  Recent Labs Lab 11/19/15 0400  11/22/15 0415 11/22/15 0534 11/23/15 0410 11/24/15 0515  CALCIUM 9.0  < > 7.7* 8.7* 8.6* 8.7*  MG 2.1  < > 1.9 2.0 2.0  --   PHOS 4.5  --   --   --   --   --   < > = values in this interval not displayed. CBC  Recent Labs Lab 11/22/15 0534 11/23/15 0410  11/24/15 0515  WBC 9.8 8.5 8.6  HGB 10.9* 10.8* 10.9*  HCT 35.1* 35.4* 36.0*  PLT 247 243 226   Coag's No results for input(s): APTT, INR in the last 168 hours.  Sepsis Markers No results for input(s): LATICACIDVEN, PROCALCITON, O2SATVEN in the last 168 hours. ABG  Recent Labs Lab 11/19/15 1616  PHART 7.496*  PCO2ART 31.0*  PO2ART 46.0*    Liver Enzymes  Recent Labs Lab 11/19/15 0400  ALBUMIN 2.7*    Cardiac Enzymes No results for input(s): TROPONINI, PROBNP in the last 168 hours.  Glucose  Recent Labs Lab 11/23/15 1113 11/23/15 1554 11/23/15 1944 11/23/15 2249 11/24/15 0309 11/24/15 0725  GLUCAP 107* 95 91 94 98 90   Imaging Dg Chest 2 View  11/24/2015  CLINICAL DATA:  Respiratory failure. EXAM: CHEST  2 VIEW COMPARISON:  11/23/2015 and 11/21/2015. FINDINGS: Tracheostomy, feeding tube and right arm PICC appear unchanged. There appears to be additional tubing overlying the upper right chest on the frontal examination, not clearly seen on the lateral view. The heart size and mediastinal contours are stable status post CABG. Left pleural effusion and left basilar airspace disease have not significantly changed. The right lung is clear. There is no pneumothorax. IMPRESSION: No significant change in left pleural effusion and left basilar airspace disease. Electronically Signed   By: Carey Bullocks M.D.   On: 11/24/2015 08:16  STUDIES:    MICROBIOLOGY: Tracheal Asp Ctx 3/27:  Oral Flora Urine Ctx 3/24:  Negative MRSA PCR 3/20:  Negative MRSA PCR 3/14:  Negative C diff 4/10 > Negative  ANTIBIOTICS: Zosyn 3/29 - 4/1 Vancomcyin 3/21 - 3/22; 3/29 - 4/7 Cefuroxime 3/21 - 3/23 (periop prophylaxis) Elita Quick 3/27 - 3/29 Levaquin 4/1 - 4/7 Flagyl 4/9 >> 4/11  SIGNIFICANT EVENTS: 3/14 - Admit 3/14 - LHC 3/21 - CABG 3/27 - IABP replaced >> 4/1 3/28 - Chest Tubes removed. RHC & LHC.  LINES/TUBES: Trach 6.0 (JY) 4/5 >>  L Spencer CVL 3/28>>> R PICC 3/23>>> R  NJT>>> Foley>>> IABP 3/27 - 4/1 OETT 7.5 Extubated 3/22; 3.24 - 4/5  ASSESSMENT / PLAN:  PULMONARY A: Acute Hypoxic Respiratory Failure - Improving. Chronic Hypercarbic Respiratory Failure - OSA/OHS, COPD. Chronic hypoxemic resp failure - cardiogenic pulmonary edema  P:   Continue trach collar  Consider dc or downsize trach once secretions decrease. Most likely he can handle decannulation now. Pasir- muir valve  On Writer. Remains NPO on tube feeds. Removing trach may solve that ssue  CARDIOVASCULAR A:  S/P CABG & CEA Left Ventricular Heart Failure post MI Cardiogenic Shock Periop Complete Heart Block & Post-op V Tach H/O HTN  P:  Management per CVTS & Cardiology. ASA VT Lipitor VT Amiodarone gtt. Changed to PO Revatio VT tid   RENAL Lab Results  Component Value Date   CREATININE 1.51* 11/24/2015   CREATININE 1.62* 11/23/2015   CREATININE 1.64* 11/22/2015    A:   Acute Renal Failure - Resolving. Hypoklalemia - Resolved. Hypernatremia - Worsening P:   Trending UOP with Foley Electrolytes & Renal Function daily Free water. D5W at 150/hr for hypernatremia  GASTROINTESTINAL A:   Transaminitis - Likely shock liver/hepatic congestion. Ileus  P:   Tolerating tube feeds. Ileus is improving  HEMATOLOGIC A:   Anemia - Hgb stable.  Leukocytosis - Mild.  P:  Transfusion PRBC as needed Daily CBC  INFECTIOUS A:   Possible HCAP - Ctx w/ Oral Flora.  P:   Completed 10 day course of antibiotics. Off flagyl as C.diff is negative.  Recheck sputum cultures negative from 4/12  ENDOCRINE CBG (last 3)   Recent Labs  11/23/15 2249 11/24/15 0309 11/24/15 0725  GLUCAP 94 98 90     A:   Hyperglycemia - BG controlled.  P:   Continue Levemir bid Accu-checks AC/HS SSI per algorithm  NEUROLOGIC A:   Off vent and medications needs decreased Post-op Pain Control   P:   RASS goal: 0 Limited pain meds  FAMILY UPDATES: Pt and significant  other updated at bedside 4/11  TODAY'S SUMMARY:  55 year old male s/p CABG & Right CEA with complicated post-op course. Patient's renal function slowly improving. Tolerating tracheostomy collar 24/6 with 6 cuffed trach. Increased secretions noted.  [name] ACNP [facility] PCC[facility] Pager [phone] till 3 pm If no answer page [phone] 11/24/2015, 11:14 AM

## 2015-11-24 NOTE — Progress Notes (Signed)
Nutrition Follow-up  DOCUMENTATION CODES:   Obesity unspecified  INTERVENTION:    Change TF to Jevity 1.2 at goal rate of 60 ml/h, continue Prostat 30 ml TID for total intake of 2028 kcals, 125 gm protein, 1166 ml free water daily.  Diet advancement per SLP as able.  NUTRITION DIAGNOSIS:   Inadequate oral intake related to inability to eat as evidenced by NPO status.  Ongoing  GOAL:   Patient will meet greater than or equal to 90% of their needs  Met  MONITOR:   Vent status, Labs, Weight trends, Skin, I & O's  ASSESSMENT:   Pt with hx of HTN admitted 3/14 at [facility] with SOB. Pt with inferior STEMI and in complete heart block s/p heart cath on admission.   S/P CABG x 4 on 3/22.  S/P tracheostomy on 4/5. Trach in place, may remove soon per discussion with RN. Plans for SLP re-evaluation on Wednesday, 4/19. Patient is currently receiving Vital High Protein via Cortrak NGT at 50 ml/h (1200 ml/day) with Prostat 30 ml TID to provide 1500 kcals, 150 gm protein, 1003 ml free water daily. Receiving 200 ml free water flushes every 4 hours.  Diet Order:  Diet NPO time specified  Skin:  Reviewed, no issues  Last BM:  4/18  Height:   Ht Readings from Last 1 Encounters:  11/23/15 5' 10.5" (1.791 m)    Weight:   Wt Readings from Last 1 Encounters:  11/24/15 228 lb 9.9 oz (103.7 kg)    Ideal Body Weight:  75.4 kg  BMI:  Body mass index is 32.33 kg/(m^2).  Estimated Nutritional Needs:   Kcal:  1800-2000  Protein:  110-125 gm  Fluid:  1.8-2 L  EDUCATION NEEDS:   No education needs identified at this time  Molli Barrows, [place], [place], [place] Pager [phone] After Hours Pager [phone]

## 2015-11-24 NOTE — Progress Notes (Signed)
Patient ID: Christopher [name], male   DOB: 10-11-1960, 55 y.o.   MRN: [id number] Patient was admitted to 4W20 via wheelchair, escorted by nursing staff and significant other.  Patient and significant other verbalized understanding of rehab process, signed fall safety agreement.  NGT in place and infusing.  PICC infusing.  Unna boot to bilateral lower extremities intact.  Marissa Nestle, PA present to assess.  Appears to be in no immediate distress.  Dani Gobble, RN

## 2015-11-24 NOTE — PMR Pre-admission (Signed)
PMR Admission Coordinator Pre-Admission Assessment  Patient: Christopher [name] is an 56 y.o., male MRN: [id number] DOB: 25-Sep-1960 Height: 5' 10.5" (179.1 cm) Weight: 103.7 kg (228 lb 9.9 oz)              Insurance Information HMO:     PPO: Yes     PCP:       IPA:       80/20:       OTHER:  Group # [id number] PRIMARY: [place]      Policy#: [id number]      Subscriber: GeGlennon MacM Name: HaSheila Oats    Phone#: [phone]   Fax#: [phone]-Cert#: [id number] for 7 days 04/18 to 11/30/15      Employer: Self employed Benefits:  Phone #: [phone]   Name:  MySheryn BisonEff. Date: 08/09/15     Deduct:  $1000 (met $1000)      Out of Pocket Max: $2000 (met $2000)      Life Max: unlimited CIR: 90%      SNF: 100% with 60 days max Outpatient: 90% with 20 visit limit     Co-Pay: 10% Home Health: 100% with 20 visit limit      Co-Pay: none DME: 90%     Co-Pay: 10% Providers: in network  Medicaid Application Date:        Case Manager:   Disability Application Date:        Case Worker:    Emergency Contact Information Contact Information    Name Relation Home Work Mobile   Collins,Evelyn Significant other [phone]   Current Medical History  Patient Admitting Diagnosis: Debility, dysphagia due to CAD, respiratory failure. S/P CABG x 4    History of Present Illness: A [age].o. male with history of HTN, R-CAS who was admitted via [facility] chest pain due to STEMI. He was found to have severe CAD on cardiac cath as well as > 80% R-ICA stenosis. Acute on combined systolic/diastolic CHF with mild volume overload treated with diuretics. He underwent CABG X 4 with placement on IAB by Dr. VaPrescott Gumnd R-CEA by Dr. ChBridgett LarssonPost op had issues with VT/VF on 03/26-3/27 as well as issues with complete HB. IABP replaced or pressure support and requiring shocks as well as antiarrythmic's, external pacer for HB as well as re-intubation.  pressors. Dr. MaMercy Mooreonsulted for management of ARF due to ATN and recommended limiting steroids. He was unable to tolerate attempts at vent wean and tracheostomy placed on 04/05 by Dr. YaNelda MarseilleHe has been treated for HCAP and has been on TF due to ongoing post op ileus. Bloody secretions from trach resolved with d/c of heparin and he been off vent X 24 hours with trach stand by. He remains NPO with PMSV trials initiated. Therapy ongoing and CIR recommended due to debility.    Past Medical History  Past Medical History  Diagnosis Date  . Hypertension   . TIA (transient ischemic attack)   . Stenosis of right carotid artery   . Complete heart block (HCLattimore3/14/2017    Family History  family history includes Breast cancer in his sister; Congestive Heart Failure in his father; Failure to thrive in his father; Heart disease in his mother.  Prior Rehab/Hospitalizations: No previous rehab.  Has the patient had major surgery during 100 days prior to admission? No  Current Medications   Current facility-administered medications:  .  0.9 %  sodium chloride infusion, , Intravenous, Continuous, Ivin Poot, MD, Stopped at 11/15/15 0600 .  amiodarone (PACERONE) tablet 200 mg, 200 mg, Oral, BID, Jolaine Artist, MD, 200 mg at 11/24/15 0916 .  antiseptic oral rinse (CPC / CETYLPYRIDINIUM CHLORIDE 0.05%) solution 7 mL, 7 mL, Mouth Rinse, q12n4p, Ivin Poot, MD, 7 mL at 11/24/15 1537 .  aspirin chewable tablet 81 mg, 81 mg, Oral, Daily, Larey Dresser, MD, 81 mg at 11/24/15 0916 .  atorvastatin (LIPITOR) tablet 80 mg, 80 mg, Oral, q1800, Sherren Mocha, MD, 80 mg at 11/23/15 1842 .  chlorhexidine (PERIDEX) 0.12 % solution 15 mL, 15 mL, Mouth Rinse, BID, Ivin Poot, MD, 15 mL at 11/24/15 0915 .  dextrose 5 % solution, , Intravenous, Continuous, Ivin Poot, MD, Last Rate: 10 mL/hr at 11/24/15 1500 .  diphenhydrAMINE (BENADRYL) injection 25 mg, 25 mg, Intravenous, Q8H PRN, Melrose Nakayama, MD, 25 mg at 11/22/15 2215 .  enoxaparin (LOVENOX) injection 40 mg, 40 mg, Subcutaneous, Q24H, Ivin Poot, MD, 40 mg at 11/24/15 0915 .  feeding supplement (JEVITY 1.2 CAL) liquid 1,000 mL, 1,000 mL, Per Tube, Continuous, Ivin Poot, MD .  feeding supplement (PRO-STAT SUGAR FREE 64) liquid 30 mL, 30 mL, Per Tube, TID, Ivin Poot, MD, 30 mL at 11/24/15 1422 .  free water 200 mL, 200 mL, Per Tube, Q8H, Ivin Poot, MD .  hydrALAZINE (APRESOLINE) tablet 25 mg, 25 mg, Oral, 3 times per day, Ivin Poot, MD, 25 mg at 11/24/15 1422 .  levalbuterol (XOPENEX) nebulizer solution 1.25 mg, 1.25 mg, Nebulization, TID, Ivin Poot, MD, 1.25 mg at 11/24/15 1334 .  metoCLOPramide (REGLAN) injection 10 mg, 10 mg, Intravenous, Q6H, Ivin Poot, MD, 10 mg at 11/24/15 1538 .  ondansetron (ZOFRAN) injection 4 mg, 4 mg, Intravenous, Q6H PRN, Erin R Barrett, PA-C, 4 mg at 11/24/15 0456 .  oxyCODONE-acetaminophen (PERCOCET/ROXICET) 5-325 MG per tablet 1 tablet, 1 tablet, Oral, Q4H PRN **AND** oxyCODONE (Oxy IR/ROXICODONE) immediate release tablet 5 mg, 5 mg, Oral, Q3H PRN, Grace Bushy Minor, NP .  pantoprazole sodium (PROTONIX) 40 mg/20 mL oral suspension 40 mg, 40 mg, Per Tube, Daily, Ivin Poot, MD, 40 mg at 11/24/15 0914 .  potassium chloride 20 MEQ/15ML (10%) solution 40 mEq, 40 mEq, Oral, 3 times per day, Fleet Contras, MD, 40 mEq at 11/24/15 1422 .  QUEtiapine (SEROQUEL) tablet 50 mg, 50 mg, Oral, QHS, Ivin Poot, MD, 50 mg at 11/23/15 2045 .  sildenafil (REVATIO) tablet 40 mg, 40 mg, Oral, TID, Jolaine Artist, MD, 40 mg at 11/24/15 0917 .  silver sulfADIAZINE (SILVADENE) 1 % cream, , Topical, BID, Corey Harold, NP .  sodium chloride (OCEAN) 0.65 % nasal spray 1 spray, 1 spray, Each Nare, PRN, Nani Skillern, PA-C .  sodium chloride flush (NS) 0.9 % injection 10-40 mL, 10-40 mL, Intracatheter, Q12H, Ivin Poot, MD, 10 mL at 11/24/15 0937 .   sodium chloride flush (NS) 0.9 % injection 10-40 mL, 10-40 mL, Intracatheter, PRN, Ivin Poot, MD .  torsemide [facility]) tablet 60 mg, 60 mg, Oral, Daily, Jolaine Artist, MD, 60 mg at 11/24/15 0917 .  traMADol (ULTRAM) tablet 50-100 mg, 50-100 mg, Oral, Q4H PRN, Erin R Barrett, PA-C, 50 mg at 11/16/15 1607 .  triamcinolone cream (KENALOG) 0.5 %, , Topical, BID, Ivin Poot, MD  Patients Current Diet: Diet NPO time specified  Precautions /  Restrictions Precautions Precautions: Sternal, Fall Precaution Comments: instructed pt and wife in rationale for sternal precautions, pt adhering during sit to stand without cues Restrictions Weight Bearing Restrictions: No Other Position/Activity Restrictions: Sternal preacutions   Has the patient had 2 or more falls or a fall with injury in the past year?No  Prior Activity Level Community (5-7x/wk): Went out daily.  Worked FT as an Buyer, retail.  Home Assistive Devices / Equipment Home Assistive Devices/Equipment: None Home Equipment: None  Prior Device Use: Indicate devices/aids used by the patient prior to current illness, exacerbation or injury? None  Prior Functional Level Prior Function Level of Independence: Independent Comments: 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  Self Care: Did the patient need help bathing, dressing, using the toilet or eating?  Independent  Indoor Mobility: Did the patient need assistance with walking from room to room (with or without device)? Independent  Stairs: Did the patient need assistance with internal or external stairs (with or without device)? Independent  Functional Cognition: Did the patient need help planning regular tasks such as shopping or remembering to take medications? Independent  Current Functional Level Cognition  Overall Cognitive Status: Impaired/Different from baseline Difficult to assess due to: Tracheostomy Orientation  Level: Oriented X4    Extremity Assessment (includes Sensation/Coordination)  Upper Extremity Assessment: Overall WFL for tasks assessed  Lower Extremity Assessment: Defer to PT evaluation    ADLs  Overall ADL's : Needs assistance/impaired Eating/Feeding: Set up, Sitting Eating/Feeding Details (indicate cue type and reason): ice chips Grooming: Wash/dry hands, Standing, Min guard Upper Body Bathing: Minimal assitance, Sitting Lower Body Bathing: Minimal assistance, Sit to/from stand Upper Body Dressing : Minimal assistance, Sitting Upper Body Dressing Details (indicate cue type and reason): front opening gown Lower Body Dressing: Minimal assistance, Sit to/from stand Lower Body Dressing Details (indicate cue type and reason): required rest break between putting socks on, able to cross foot over opposite knee Toilet Transfer: Min guard Toileting- Clothing Manipulation and Hygiene: Total assistance, Sit to/from stand Functional mobility during ADLs: Min guard (pushing w/c) General ADL Comments: Instructed pt and wife on energy conservation strategies including benefits of sitting to shower.    Mobility  Overal bed mobility: Needs Assistance Bed Mobility: Rolling, Sidelying to Sit Rolling: Supervision Sidelying to sit: HOB elevated, Supervision Sit to supine: Min guard General bed mobility comments: pt in chair    Transfers  Overall transfer level: Needs assistance Equipment used: 1 person hand held assist Transfers: Sit to/from Stand Sit to Stand: Min guard Stand pivot transfers: Min assist General transfer comment: assist and cues for precautions    Ambulation / Gait / Stairs / Wheelchair Mobility  Ambulation/Gait Ambulation/Gait assistance: Museum/gallery curator (Feet): 300 Feet Assistive device: Rolling walker (2 wheeled) Gait Pattern/deviations: Step-through pattern, Decreased stride length, Trunk flexed General Gait Details: slower pace this session and with  some stops due to wheezing, RN aware to call for breathing tx Gait velocity: decreased Gait velocity interpretation: Below normal speed for age/gender    Posture / Balance Balance Overall balance assessment: Needs assistance Sitting-balance support: Feet supported, No upper extremity supported Sitting balance-Leahy Scale: Good Standing balance support: Bilateral upper extremity supported Standing balance-Leahy Scale: Fair Standing balance comment: UE support for ambulation, static balance without support    Special needs/care consideration BiPAP/CPAP No CPM No Continuous Drip IV KVO Dialysis No      Life Vest No Oxygen Yes, trach collar Special Bed No Trach Size Yes,  trach Shiley #6 cuffed Wound Vac (area) No     Skin Sternal incision                             Bowel mgmt: Last BM 11/24/15 Bladder mgmt:Voiding WDL Diabetic mgmt No    Previous Home Environment Living Arrangements: Spouse/significant other Available Help at Discharge: Family, Available 24 hours/day (wife will take off work to care for pt) Type of Home: Apartment Home Layout: One level Home Access: Stairs to enter Entrance Stairs-Rails: Right, Left Entrance Stairs-Number of Steps: 8+8 Bathroom Shower/Tub: Chiropodist: Handicapped height Home Care Services: No Additional Comments: Limited information due to trach/vent  Discharge Living Setting Plans for Discharge Living Setting: Lives with (comment), Apartment (Lives with girlfriend.) Type of Home at Discharge: Apartment (2nd floor apartment.) Discharge Home Layout: One level Discharge Home Access: Stairs to enter Entrance Stairs-Number of Steps: 8 steps, then a landing, then 8 more steps to 2nd level apartment Does the patient have any problems obtaining your medications?: No  Social/Family/Support Systems Patient Roles: Other (Comment) (Lives with girlfriend.) Contact Information: Bobbye Morton - girlfriend Anticipated Caregiver:  self and girlfriend Anticipated Caregiver's Contact Information: Estill Bamberg - girlfriend [phone] or [phone] Ability/Limitations of Caregiver: Girlfriend works but has leave until 01/18/16 (unless she goes back to work once patient comes to rehab) Caregiver Availability: Intermittent Discharge Plan Discussed with Primary Caregiver: Yes Is Caregiver In Agreement with Plan?: Yes Does Caregiver/Family have Issues with Lodging/Transportation while Pt is in Rehab?: No  Goals/Additional Needs Patient/Family Goal for Rehab: PT/OT/St mod I goals Expected length of stay: 7 days Cultural Considerations: None Dietary Needs: NPO, has tube feedings Equipment Needs: TBD Pt/Family Agrees to Admission and willing to participate: Yes Program Orientation Provided & Reviewed with Pt/Caregiver Including Roles  & Responsibilities: Yes  Decrease burden of Care through IP rehab admission: N/A  Possible need for SNF placement upon discharge: Not planned  Patient Condition: This patient's medical and functional status has changed since the consult dated: 11/19/15 in which the Rehabilitation Physician determined and documented that the patient's condition is appropriate for intensive rehabilitative care in an inpatient rehabilitation facility. See "History of Present Illness" (above) for medical update. Functional changes are:  Currently requiring min assist to ambulate 300 feet RW. Patient's medical and functional status update has been discussed with the Rehabilitation physician and patient remains appropriate for inpatient rehabilitation. Will admit to inpatient rehab today.  Preadmission Screen Completed By:  Retta Diones, 11/24/2015 3:38 PM ______________________________________________________________________   Discussed status with Dr.  Naaman Plummer on 11/24/15 at 1537 and received telephone approval for admission today.  Admission Coordinator:  Retta Diones, time1538/Date04/18/17

## 2015-11-25 ENCOUNTER — Inpatient Hospital Stay (HOSPITAL_COMMUNITY): Payer: BLUE CROSS/BLUE SHIELD

## 2015-11-25 ENCOUNTER — Inpatient Hospital Stay (HOSPITAL_COMMUNITY): Payer: BLUE CROSS/BLUE SHIELD | Admitting: Speech Pathology

## 2015-11-25 ENCOUNTER — Inpatient Hospital Stay (HOSPITAL_COMMUNITY): Payer: BLUE CROSS/BLUE SHIELD | Admitting: Occupational Therapy

## 2015-11-25 ENCOUNTER — Inpatient Hospital Stay (HOSPITAL_COMMUNITY): Payer: BLUE CROSS/BLUE SHIELD | Admitting: Physical Therapy

## 2015-11-25 DIAGNOSIS — R1310 Dysphagia, unspecified: Secondary | ICD-10-CM

## 2015-11-25 DIAGNOSIS — J410 Simple chronic bronchitis: Secondary | ICD-10-CM

## 2015-11-25 LAB — COMPREHENSIVE METABOLIC PANEL
ALT: 62 U/L (ref 17–63)
ANION GAP: 8 (ref 5–15)
AST: 56 U/L — ABNORMAL HIGH (ref 15–41)
Albumin: 2.4 g/dL — ABNORMAL LOW (ref 3.5–5.0)
Alkaline Phosphatase: 68 U/L (ref 38–126)
BUN: 46 mg/dL — ABNORMAL HIGH (ref 6–20)
CHLORIDE: 103 mmol/L (ref 101–111)
CO2: 30 mmol/L (ref 22–32)
Calcium: 8.5 mg/dL — ABNORMAL LOW (ref 8.9–10.3)
Creatinine, Ser: 1.56 mg/dL — ABNORMAL HIGH (ref 0.61–1.24)
GFR calc non Af Amer: 49 mL/min — ABNORMAL LOW (ref >60)
GFR, EST AFRICAN AMERICAN: 56 mL/min — AB (ref >60)
Glucose, Bld: 121 mg/dL — ABNORMAL HIGH (ref 65–99)
Potassium: 3.8 mmol/L (ref 3.5–5.1)
SODIUM: 141 mmol/L (ref 135–145)
Total Bilirubin: 0.7 mg/dL (ref 0.3–1.2)
Total Protein: 5.9 g/dL — ABNORMAL LOW (ref 6.5–8.1)

## 2015-11-25 LAB — CBC WITH DIFFERENTIAL/PLATELET
Basophils Absolute: 0 10*3/uL (ref 0.0–0.1)
Basophils Relative: 1 %
EOS ABS: 0.3 10*3/uL (ref 0.0–0.7)
EOS PCT: 5 %
HCT: 32.1 % — ABNORMAL LOW (ref 39.0–52.0)
Hemoglobin: 9.8 g/dL — ABNORMAL LOW (ref 13.0–17.0)
LYMPHS ABS: 1 10*3/uL (ref 0.7–4.0)
Lymphocytes Relative: 16 %
MCH: 27.5 pg (ref 26.0–34.0)
MCHC: 30.5 g/dL (ref 30.0–36.0)
MCV: 89.9 fL (ref 78.0–100.0)
Monocytes Absolute: 0.5 10*3/uL (ref 0.1–1.0)
Monocytes Relative: 8 %
Neutro Abs: 4.5 10*3/uL (ref 1.7–7.7)
Neutrophils Relative %: 70 %
PLATELETS: 187 10*3/uL (ref 150–400)
RBC: 3.57 MIL/uL — AB (ref 4.22–5.81)
RDW: 17.5 % — ABNORMAL HIGH (ref 11.5–15.5)
WBC: 6.4 10*3/uL (ref 4.0–10.5)

## 2015-11-25 MED ORDER — LEVALBUTEROL HCL 1.25 MG/0.5ML IN NEBU
1.2500 mg | INHALATION_SOLUTION | Freq: Four times a day (QID) | RESPIRATORY_TRACT | Status: DC | PRN
  Filled 2015-11-25: qty 0.5

## 2015-11-25 MED ORDER — SPIRONOLACTONE 25 MG PO TABS
12.5000 mg | ORAL_TABLET | Freq: Every day | ORAL | Status: DC
  Administered 2015-11-25 – 2015-11-30 (×6): 12.5 mg via ORAL
  Filled 2015-11-25 (×5): qty 1
  Filled 2015-11-25: qty 0.5

## 2015-11-25 MED ORDER — METOCLOPRAMIDE HCL 5 MG/ML IJ SOLN
10.0000 mg | Freq: Four times a day (QID) | INTRAMUSCULAR | Status: DC
  Administered 2015-11-25: 10 mg via INTRAVENOUS
  Filled 2015-11-25: qty 2

## 2015-11-25 MED ORDER — DIPHENHYDRAMINE HCL 50 MG/ML IJ SOLN: 25.0000 mg | Freq: Three times a day (TID) | INTRAMUSCULAR | Status: DC | PRN

## 2015-11-25 MED ORDER — TORSEMIDE 20 MG PO TABS
80.0000 mg | ORAL_TABLET | Freq: Every day | ORAL | Status: DC
  Administered 2015-11-25 – 2015-11-26 (×2): 80 mg via ORAL
  Filled 2015-11-25 (×2): qty 4

## 2015-11-25 MED ORDER — ALUM & MAG HYDROXIDE-SIMETH 200-200-20 MG/5ML PO SUSP: 30.0000 mL | ORAL | Status: DC | PRN

## 2015-11-25 MED ORDER — METOCLOPRAMIDE HCL 10 MG PO TABS
10.0000 mg | ORAL_TABLET | Freq: Three times a day (TID) | ORAL | Status: DC
  Administered 2015-11-25 – 2015-11-28 (×11): 10 mg via ORAL
  Filled 2015-11-25 (×12): qty 1

## 2015-11-25 MED ORDER — ENSURE ENLIVE PO LIQD
237.0000 mL | Freq: Two times a day (BID) | ORAL | Status: DC
  Administered 2015-11-25 – 2015-11-26 (×2): 237 mL via ORAL

## 2015-11-25 MED ORDER — SODIUM CHLORIDE 0.9% FLUSH: 10.0000 mL | INTRAVENOUS | Status: DC | PRN

## 2015-11-25 MED ORDER — CARVEDILOL 3.125 MG PO TABS
3.1250 mg | ORAL_TABLET | Freq: Two times a day (BID) | ORAL | Status: DC
  Administered 2015-11-25 – 2015-11-26 (×3): 3.125 mg via ORAL
  Filled 2015-11-25 (×4): qty 1

## 2015-11-25 MED ORDER — SODIUM CHLORIDE 0.9% FLUSH
10.0000 mL | Freq: Two times a day (BID) | INTRAVENOUS | Status: DC
  Administered 2015-11-26: 10 mL

## 2015-11-25 MED ORDER — METOCLOPRAMIDE HCL 5 MG/ML IJ SOLN: 10.0000 mg | Freq: Three times a day (TID) | INTRAMUSCULAR | Status: DC

## 2015-11-25 MED ORDER — PRO-STAT SUGAR FREE PO LIQD
30.0000 mL | Freq: Every day | ORAL | Status: DC
  Filled 2015-11-25: qty 30

## 2015-11-25 NOTE — Care Management Note (Signed)
Case Management Note  Patient Details  Name: Christopher [name] MRN: [id number] Date of Birth: [birth year]  Subjective/Objective:    Patient was dc to CIR .                Action/Plan:   Expected Discharge Date:                  Expected Discharge Plan:  IP Rehab Facility  In-House Referral:     Discharge planning Services  CM Consult  Post Acute Care Choice:    Choice offered to:     DME Arranged:    DME Agency:     HH Arranged:    HH Agency:     Status of Service:  Completed, signed off  Medicare Important Message Given:    Date Medicare IM Given:    Medicare IM give by:    Date Additional Medicare IM Given:    Additional Medicare Important Message give by:     If discussed at Long Length of Stay Meetings, dates discussed:    Additional Comments:  Leone Haven, RN 11/25/2015, 11:49 AM

## 2015-11-25 NOTE — Evaluation (Signed)
Speech Language Pathology PMSV Evaluation and BSE   Patient Details  Name: Christopher [name] MRN: [id number] Date of Birth: 04/18/1961  SLP Diagnosis: Dysphagia, Speech and Language deficits  Rehab Potential: Excellent ELOS: 7-10 days     Today's Date: 11/25/2015 SLP Individual Time: 8502-7741 SLP Individual Time Calculation (min): 30 min   Problem List:  Patient Active Problem List   Diagnosis Date Noted  . Debility 11/24/2015  . Coronary artery disease involving native coronary artery of native heart without angina pectoris   . VT (ventricular tachycardia) ([place])   . Ileus ([place])   . Hyperosmolality and/or hypernatremia   . Acute respiratory failure with hypoxia ([place])   . Chronic obstructive pulmonary disease ([place])   . Acute renal failure ([place])   . Ventilator dependence ([place])   . Tracheostomy status ([place])   . S/P CABG x 4 10/27/2015  . CAD (coronary artery disease)   . Hypertensive heart disease with heart failure ([place])   . Carotid artery stenosis   . Acute combined systolic and diastolic heart failure ([place]) 10/20/2015  . Complete heart block ([place]) 10/20/2015  . Acute MI, inferior wall, initial episode of care ([place]) 10/20/2015  . TIA (transient ischemic attack) 12/25/2014  . Essential hypertension 12/25/2014  . Hyperlipidemia 12/25/2014   Past Medical History:  Past Medical History  Diagnosis Date  . Hypertension   . TIA (transient ischemic attack)   . Stenosis of right carotid artery   . Complete heart block ([place]) 10/20/2015   Past Surgical History:  Past Surgical History  Procedure Laterality Date  . Arm surgery Right 2003  . Eye surgery    . Laceration repair    . Cardiac catheterization N/A 10/20/2015    Procedure: Left Heart Cath and Coronary Angiography;  Surgeon: Sherren Mocha, MD;  Location: [facility];  Service: Cardiovascular;  Laterality: N/A;  . Coronary artery bypass graft N/A 10/27/2015    Procedure: CORONARY ARTERY BYPASS GRAFTING (CABG) x  four, using left internal mammary artery and rt leg greater saphenous vein harvested endoscopically;  Surgeon: Ivin Poot, MD;  Location: [facility];  Service: Open Heart Surgery;  Laterality: N/A;  . Tee without cardioversion N/A 10/27/2015    Procedure: TRANSESOPHAGEAL ECHOCARDIOGRAM (TEE);  Surgeon: Ivin Poot, MD;  Location: [facility];  Service: Open Heart Surgery;  Laterality: N/A;  . Endarterectomy Right 10/27/2015    Procedure: RIGHT ENDARTERECTOMY CAROTID with patch angioplasty using Xenosure bovine pericardium patch;  Surgeon: Conrad Mansfield, MD;  Location: [facility];  Service: Vascular;  Laterality: Right;  . Cardiac catheterization N/A 11/02/2015    Procedure: Right/Left Heart Cath and Coronary/Graft Angiography;  Surgeon: Larey Dresser, MD;  Location: [facility];  Service: Cardiovascular;  Laterality: N/A;    Assessment / Plan / Recommendation Clinical Impression Patient is a 55 y.o. male with history of HTN, R-CAS who was admitted via [place] with chest pain due to STEMI. He was found to have severe CAD on cardiac cath as well as > 80% R-ICA stenosis.  Acute on combined systolic/diastolic CHF with mild volume overload treated with diuretics. He underwent CABG X 4 with placement on IAB by Dr. Prescott Gum and  R-CEA by Dr. Bridgett Larsson.  Post op had issues with VT/VF on 03/26-3/27 as well as issues with complete HB.  IABP replaced or pressure support and requiring shocks as well as antiarrythmic's, external pacer for HB as well as re-intubation. Dr. Mercy Moore consulted for management of ARF  due to ATN and recommended limiting steroids.  He was unable to tolerate attempts at vent wean and tracheostomy placed on 04/05 by Dr. Nelda Marseille. He has been treated for HCAP and post op ileus treated with reglan.  Bloody secretions from trach resolved with d/c of heparin and he was extubated to ATC.  He is tolerating cuffed #6 trach but continues to have increased secretions therefore to continue on current  trach until secretions improve. Sputum cultures 04/12 negative and he has completed treatment for possible HCAP.  PMSV trials initiated with trials of ice cubes.  He continues on tube feeds for nutritional support and has had wheezing with ambulation. Left pleural effusion persistent and Demadex adjusted.   Therapy ongoing and CIR recommended due to debility and patient admitted 11/24/15.   Upon arrival, patient's PMSV was in place. Patient demonstrated efficient vocal intensity at the sentence level to achieve 100% intelligibility and patient's vitals remained [facility] throughout the session. Recommend patient wear his PMSV during all waking hours with recommendations to downsize to a smaller, cuffless trach. Patient also administered a BSE with focus on tolerance of his new, upgraded diet of Dys. 2 textures with thin liquids via cup. Patient demonstrated a delayed swallow initiation and utilized multiple swallows with both solids and liquids. Patient demonstrated intermittent cough X 2, however, difficult to assess if due to swallowing due to baseline coughing with his trach. Patient consumed minimal amount of his meal and required supervision verbal cues for use of swallowing compensatory strategies. Recommend patient continue his current diet with full supervision. Patient would benefit from skilled SLP intervention to maximize his swallowing function and functional communication. Patient may also benefit from a cognitive-linguistic evaluation to assess for possible higher-level deficits.   Skilled Therapeutic Interventions          Administered a PMSV evaluation and BSE. Please see above for details.   SLP Assessment  Patient will need skilled Speech Lanaguage Pathology Services during CIR admission    Recommendations  Patient may use Passy-Muir Speech Valve: During all waking hours (remove during sleep);During PO intake/meals PMSV Supervision: Intermittent MD: Please consider changing trach tube to :  Smaller size;Cuffless SLP Diet Recommendations: Dysphagia 2 (Fine chop);Thin Liquid Administration via: Cup;No straw Medication Administration: Crushed with puree Supervision: Patient able to self feed;Full supervision/cueing for compensatory strategies Compensations: Minimize environmental distractions;Small sips/bites;Clear throat intermittently;Effortful swallow;Multiple dry swallows after each bite/sip Postural Changes and/or Swallow Maneuvers: Out of bed for meals;Upright 30-60 min after meal Oral Care Recommendations: Oral care QID Recommendations for Other Services: Neuropsych consult Patient destination: Home Follow up Recommendations: Outpatient SLP Equipment Recommended: None recommended by SLP    SLP Frequency 3 to 5 out of 7 days   SLP Duration  SLP Intensity  SLP Treatment/Interventions 7-10 days   Minumum of 1-2 x/day, 30 to 90 minutes  Cueing hierarchy;Functional tasks;Patient/family education;Therapeutic Activities;Environmental controls;Internal/external aids;Dysphagia/aspiration precaution training;Speech/Language facilitation    Pain No/Denies Pain   Prior Functioning Type of Home: Apartment Available Help at Discharge: Family;Available 24 hours/day (Wife able to take time off work to assist) Vocation: Full time employment  Function:  Eating Eating   Modified Consistency Diet: Yes Eating Assist Level: Supervision or verbal cues;Set up assist for   Eating Set Up Assist For: Opening containers       Cognition Comprehension Comprehension assist level: Understands basic 90% of the time/cues < 10% of the time  Expression Expression assistive device: Talk trach valve Expression assist level: Expresses basic 90% of the time/requires  cueing < 10% of the time.  Social Interaction Social Interaction assist level: Interacts appropriately 90% of the time - Needs monitoring or encouragement for participation or interaction.  Problem Solving Problem solving assist  level: Solves basic 90% of the time/requires cueing < 10% of the time  Memory Memory assist level: Recognizes or recalls 90% of the time/requires cueing < 10% of the time   Short Term Goals: Week 1: SLP Short Term Goal 1 (Week 1): Patient will consume current diet with minimal overt s/s of aspiration with Mod I for use of swallowing compensatory strategies. SLP Short Term Goal 2 (Week 1): Patient will perform pharyngeal strengthening exercises with Mod I.  SLP Short Term Goal 3 (Week 1): Patient will wear the PMSV during all waking hours with vitals remaining WFL and 100% intelligibility at the conversation level with Mod I.  SLP Short Term Goal 4 (Week 1): Patient will independently donn/doff PMSV SLP Short Term Goal 5 (Week 1): Patient will participate in the MoCA to assess cognitive function.   Refer to Care Plan for Long Term Goals  Recommendations for other services: Neuropsych  Discharge Criteria: Patient will be discharged from SLP if patient refuses treatment 3 consecutive times without medical reason, if treatment goals not met, if there is a change in medical status, if patient makes no progress towards goals or if patient is discharged from hospital.  The above assessment, treatment plan, treatment alternatives and goals were discussed and mutually agreed upon: by patient and by family  Cherissa Hook 11/25/2015, 3:36 PM

## 2015-11-25 NOTE — Progress Notes (Signed)
Pt pulled panda tube out. Deatra Ina, PA notified. [employer] tube team unavailable until 8AM. RN tried to order supplies from materials, and they said they we no longer stock panda tubes. Deatra Ina, PA ordered to wait until the AM and see what the [employer] tube team says about reinserting tube. Will continue to monitor. Rudie Meyer, RN

## 2015-11-25 NOTE — Progress Notes (Signed)
Speech Language Pathology Note  Patient Details  Name: Christopher [name] MRN: [id number] Date of Birth: 1960/12/25 Today's Date: 11/25/2015  MBSS complete. Full report located under chart review in imaging section.   Rocky Crafts MA, CCC-SLP 11/25/2015, 1:01 PM

## 2015-11-25 NOTE — Progress Notes (Signed)
RT Note: MD ordered for patient to have his trach downsized to a #4 Cuffless [place]. Patient has been in therapy all day so RT is now able to get to the patient this afternoon to attempt the trach change. Patient was placed in the bed to perform the procedure but RT was unable to complete the trach change. Patient has a large dark red blood clot that appears to be wrapped around part of the trach. It is causing the trach to stick to the patients skin. Rt is unable to remove the trach because of this adhesion. Delle Reining, PA was notified about our inability to complete the change and she is consulting the Critical Care Team to come and downsize his trach. The patient is aware of our inability to change the trach and states that he understands why we cannot change it and who will come to do the procedure. He has no questions presently. I was assisted by Sharene Skeans, RRT with the attempted trach change. Rt will continue to monitor and assist as needed.

## 2015-11-25 NOTE — Evaluation (Signed)
Physical Therapy Assessment and Plan  Patient Details  Name: Christopher [name] MRN: [id number] Date of Birth: 18-May-1961  PT Diagnosis: Abnormality of gait, Difficulty walking and Muscle weakness Rehab Potential: Excellent ELOS: 7-10 days    Today's Date: 11/25/2015 PT Individual Time: 1000-1101 PT Individual Time Calculation (min): 61 min    Problem List:  Patient Active Problem List   Diagnosis Date Noted  . Debility 11/24/2015  . Coronary artery disease involving native coronary artery of native heart without angina pectoris   . VT (ventricular tachycardia) ([place])   . Ileus ([place])   . Hyperosmolality and/or hypernatremia   . Acute respiratory failure with hypoxia ([place])   . Chronic obstructive pulmonary disease ([place])   . Acute renal failure ([place])   . Ventilator dependence ([place])   . Tracheostomy status ([place])   . S/P CABG x 4 10/27/2015  . CAD (coronary artery disease)   . Hypertensive heart disease with heart failure ([place])   . Carotid artery stenosis   . Acute combined systolic and diastolic heart failure ([place]) 10/20/2015  . Complete heart block ([place]) 10/20/2015  . Acute MI, inferior wall, initial episode of care ([place]) 10/20/2015  . TIA (transient ischemic attack) 12/25/2014  . Essential hypertension 12/25/2014  . Hyperlipidemia 12/25/2014    Past Medical History:  Past Medical History  Diagnosis Date  . Hypertension   . TIA (transient ischemic attack)   . Stenosis of right carotid artery   . Complete heart block ([place]) 10/20/2015   Past Surgical History:  Past Surgical History  Procedure Laterality Date  . Arm surgery Right 2003  . Eye surgery    . Laceration repair    . Cardiac catheterization N/A 10/20/2015    Procedure: Left Heart Cath and Coronary Angiography;  Surgeon: Sherren Mocha, MD;  Location: [facility];  Service: Cardiovascular;  Laterality: N/A;  . Coronary artery bypass graft N/A 10/27/2015    Procedure: CORONARY ARTERY BYPASS GRAFTING (CABG)  x four, using left internal mammary artery and rt leg greater saphenous vein harvested endoscopically;  Surgeon: Ivin Poot, MD;  Location: [facility];  Service: Open Heart Surgery;  Laterality: N/A;  . Tee without cardioversion N/A 10/27/2015    Procedure: TRANSESOPHAGEAL ECHOCARDIOGRAM (TEE);  Surgeon: Ivin Poot, MD;  Location: [facility];  Service: Open Heart Surgery;  Laterality: N/A;  . Endarterectomy Right 10/27/2015    Procedure: RIGHT ENDARTERECTOMY CAROTID with patch angioplasty using Xenosure bovine pericardium patch;  Surgeon: Conrad Boutte, MD;  Location: [facility];  Service: Vascular;  Laterality: Right;  . Cardiac catheterization N/A 11/02/2015    Procedure: Right/Left Heart Cath and Coronary/Graft Angiography;  Surgeon: Larey Dresser, MD;  Location: [facility];  Service: Cardiovascular;  Laterality: N/A;    Assessment & Plan Clinical Impression: Patient is a89 y.o. male with history of HTN, R-CAS who was admitted via [place] with chest pain due to STEMI. He was found to have severe CAD on cardiac cath as well as > 80% R-ICA stenosis. Acute on combined systolic/diastolic CHF with mild volume overload treated with diuretics. He underwent CABG X 4 with placement on IAB by Dr. Prescott Gum and R-CEA by Dr. Bridgett Larsson. Post op had issues with VT/VF on 03/26-3/27 as well as issues with complete HB. IABP replaced or pressure support and requiring shocks as well as antiarrythmic's, external pacer for HB as well as re-intubation. pressors. Dr. Mercy Moore consulted for management of ARF due to ATN and recommended limiting  steroids. He was unable to tolerate attempts at vent wean and tracheostomy placed on 04/05 by Dr. Yacoub. He has been treated for HCAP and post op ileus treated with reglan. Bloody secretions from trach resolved with d/c of heparin and he was extubated to ATC. He is tolerating cuffed #6 trach but continues to have increased secretions therefore to continue on current trach  until secretions improve. Sputum cultures 04/12 negative and he has completed treatment for possible HCAP. PMSV trials initiated with trials of ice cubes. He continues on tube feeds for nutritional support and has had wheezing with ambulation. Left pleural effusion persistent and Demadex adjusted. Patient transferred to CIR on 11/24/2015 .   Patient currently requires min with mobility secondary to muscle weakness, decreased cardiorespiratoy endurance and decreased standing balance, decreased postural control, decreased balance strategies and difficulty maintaining precautions.  Prior to hospitalization, patient was independent  with mobility and lived with   in a Apartment home.  Home access is 8+8Stairs to enter.  Patient will benefit from skilled PT intervention to maximize safe functional mobility, minimize fall risk and decrease caregiver burden for planned discharge home with intermittent assist.  Anticipate patient will benefit from follow up HH at discharge.  PT - End of Session Activity Tolerance: Tolerates 30+ min activity with multiple rests Endurance Deficit: Yes PT Assessment Rehab Potential (ACUTE/IP ONLY): Excellent PT Patient demonstrates impairments in the following area(s): Balance;Motor;Safety;Endurance PT Transfers Functional Problem(s): Bed to Chair;Car;Furniture;Floor PT Locomotion Functional Problem(s): Ambulation;Stairs PT Plan PT Intensity: Minimum of 1-2 x/day ,45 to 90 minutes PT Frequency: 5 out of 7 days PT Duration Estimated Length of Stay: 7-10 days  PT Treatment/Interventions: Ambulation/gait training;Balance/vestibular training;Community reintegration;Discharge planning;Disease management/prevention;DME/adaptive equipment instruction;Functional mobility training;Neuromuscular re-education;Pain management;Patient/family education;Skin care/wound management;Psychosocial support;Stair training;Therapeutic Activities;Therapeutic Exercise;UE/LE Strength  taining/ROM;UE/LE Coordination activities;Visual/perceptual remediation/compensation;Wheelchair propulsion/positioning PT Transfers Anticipated Outcome(s): Mod I PT Locomotion Anticipated Outcome(s): Mod I  PT Recommendation Follow Up Recommendations: Outpatient PT;Home health PT Patient destination: Home Equipment Recommended: To be determined  Skilled Therapeutic Intervention Patient received sitting in Recl[place] and agreeable to PT. PT conducted evaluation and initiated treatment intervention. See below for results. Gait training for 150ft, 200ft, and 75 ft with min A from PT for safety and well as cues for increased step width. PT instructed patient in 4 Stairs, 6 inches with with min A with Ascent and mod A with descent and moderate cues to maintain sternal precautions. Patient instructed in car transfer with min A from PT with out AD and cues for safety Gait training on uneven surface for 10ft with min A From PT, patient noted to have one LOB, but able to correct with min A from PT. PT instructed patient in Berg balance test, see below for results. Patient returned to room and left with trade off to OT.   PT Evaluation Precautions/Restrictions Precautions Precautions: Sternal;Fall Restrictions Weight Bearing Restrictions: No Other Position/Activity Restrictions: Sternal preacutions General Chart Reviewed: Yes Additional Pertinent History: HTN, A flutter, Heart failure Family/Caregiver Present: Yes Vital SignsTherapy Vitals Resp: 18 Oxygen Therapy SpO2: 98 % O2 Device: Not Delivered Pain Pain Assessment Pain Assessment: No/denies pain Pain Score: 0-No pain Home Living/Prior Functioning Home Living Available Help at Discharge: Family;Available 24 hours/day (Wife able to take time off work to assist) Type of Home: Apartment Home Access: Stairs to enter Entrance Stairs-Number of Steps: 8+8 Entrance Stairs-Rails: Right Home Layout: One level Bathroom Shower/Tub: Tub/shower  unit Bathroom Toilet: Handicapped height Bathroom Accessibility: Yes Prior Function Level of Independence: Independent with basic   ADLs;Independent with gait;Independent with transfers  Able to Take Stairs?: Yes Driving: Yes Vocation: Full time employment 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  Leisure: Hobbies-yes (Comment) Comments: Baking, traveling Vision/Perception    wears glasses at all times.  Cognition Overall Cognitive Status: Impaired/Different from baseline Arousal/Alertness: Awake/alert Orientation Level: Oriented X4 Memory: Appears intact Awareness: Appears intact Problem Solving: Appears intact Safety/Judgment: Appears intact Sensation Sensation Light Touch: Appears Intact Hot/Cold: Appears Intact Proprioception: Appears Intact Coordination Gross Motor Movements are Fluid and Coordinated: No Fine Motor Movements are Fluid and Coordinated: Yes Finger Nose Finger Test: Mild past shooting on the LUE, R UE WFL.  Motor  Motor Motor: Within Functional Limits Motor - Skilled Clinical Observations: Mild decreased coordination with gait.   Mobility Bed Mobility Bed Mobility: Rolling Right;Rolling Left;Supine to Sit;Sit to Supine Rolling Right: 5: Supervision Rolling Right Details: Verbal cues for precautions/safety Rolling Left: 5: Supervision Rolling Left Details: Verbal cues for precautions/safety Supine to Sit: 4: Min assist Supine to Sit Details: Verbal cues for technique;Verbal cues for precautions/safety;Verbal cues for safe use of DME/AE;Tactile cues for placement Sit to Supine: 4: Min guard Transfers Transfers: Yes Sit to Stand: 5: Supervision Sit to Stand Details: Verbal cues for precautions/safety Stand to Sit: 5: Supervision Stand to Sit Details (indicate cue type and reason): Verbal cues for precautions/safety Stand Pivot Transfers: 5: Supervision Stand Pivot Transfer Details: Verbal cues for technique;Verbal cues for  precautions/safety Locomotion  Ambulation Ambulation: Yes Ambulation/Gait Assistance: 4: Min assist Ambulation Distance (Feet): 200 Feet Assistive device: None Ambulation/Gait Assistance Details: Verbal cues for gait pattern;Verbal cues for precautions/safety Ambulation/Gait Assistance Details: narrow BOS,  Gait Gait: Yes Gait Pattern: Step-through pattern;Narrow base of support;Poor foot clearance - right Stairs / Additional Locomotion Stairs: Yes Stairs Assistance: 3: Mod assist Stairs Assistance Details: Verbal cues for gait pattern;Verbal cues for technique;Verbal cues for precautions/safety;Verbal cues for safe use of DME/AE;Visual cues/gestures for precautions/safety;Visual cues for safe use of DME/AE Stairs Assistance Details (indicate cue type and reason): Step to gait due to decreased strength Stair Management Technique: No rails Number of Stairs: 4 Height of Stairs: 6 Curb: 4: Min assist Wheelchair Mobility Wheelchair Mobility: No  Trunk/Postural Assessment  Cervical Assessment Cervical Assessment: Within Functional Limits Thoracic Assessment Thoracic Assessment: Within Functional Limits Lumbar Assessment Lumbar Assessment: Within Functional Limits Postural Control Postural Control: Within Functional Limits  Balance Balance Balance Assessed: Yes Standardized Balance Assessment Standardized Balance Assessment: Berg Balance Test Berg Balance Test Sit to Stand: Able to stand without using hands and stabilize independently Standing Unsupported: Able to stand safely 2 minutes Sitting with Back Unsupported but Feet Supported on Floor or Stool: Able to sit safely and securely 2 minutes Stand to Sit: Sits safely with minimal use of hands Transfers: Able to transfer safely, minor use of hands Standing Unsupported with Eyes Closed: Able to stand 10 seconds with supervision Standing Ubsupported with Feet Together: Needs help to attain position but able to stand for 30  seconds with feet together From Standing, Reach Forward with Outstretched Arm: Can reach confidently >25 cm (10") From Standing Position, Pick up Object from Floor: Able to pick up shoe, needs supervision From Standing Position, Turn to Look Behind Over each Shoulder: Looks behind one side only/other side shows less weight shift Turn 360 Degrees: Able to turn 360 degrees safely but slowly Standing Unsupported, Alternately Place Feet on Step/Stool: Able to complete >2 steps/needs minimal assist Standing Unsupported, One Foot in Front: Able to plae foot ahead of the other independently   and hold 30 seconds Standing on One Leg: Able to lift leg independently and hold 5-10 seconds Total Score: 43 Static Sitting Balance Static Sitting - Level of Assistance: 7: Independent Dynamic Sitting Balance Dynamic Sitting - Level of Assistance: 5: Stand by assistance Static Standing Balance Static Standing - Balance Support: During functional activity;No upper extremity supported Static Standing - Level of Assistance: 5: Stand by assistance Dynamic Standing Balance Dynamic Standing - Balance Support: During functional activity;No upper extremity supported Dynamic Standing - Level of Assistance: 4: Min assist Extremity Assessment  RUE Assessment RUE Assessment:  (WFL and tested 5/5, however, pt reports feeling weaker than at baseline.) LUE Assessment LUE Assessment: Within Functional Limits (WFL and tested 5/5, however, pt reports feeling weaker than at baseline. ) RLE Assessment RLE Assessment: Within Functional Limits LLE Assessment LLE Assessment: Within Functional Limits   See Function Navigator for Current Functional Status.   Refer to Care Plan for Long Term Goals  Recommendations for other services: None  Discharge Criteria: Patient will be discharged from PT if patient refuses treatment 3 consecutive times without medical reason, if treatment goals not met, if there is a change in medical  status, if patient makes no progress towards goals or if patient is discharged from hospital.  The above assessment, treatment plan, treatment alternatives and goals were discussed and mutually agreed upon: by patient and by family   E  11/25/2015, 12:59 PM  

## 2015-11-25 NOTE — Progress Notes (Signed)
Retta Diones, RN Rehab Admission Coordinator Signed Physical Medicine and Rehabilitation PMR Pre-admission 11/24/2015 3:23 PM  Related encounter: Admission (Discharged) from 10/20/2015 in [place] Collapse All   PMR Admission Coordinator Pre-Admission Assessment  Patient: Christopher [name] is an 55 y.o., male MRN: [id number] DOB: Apr 11, 1961 Height: 5' 10.5" (179.1 cm) Weight: 103.7 kg (228 lb 9.9 oz)  Insurance Information HMO: PPO: Yes PCP: IPA: 80/20: OTHER: Group # [id number] PRIMARY: [place] Policy#: [id number] Subscriber: GeGlennon MacM Name: HaSheila [place]#: [phone]#: [id number]-Cert#: [id number] for 7 days 04/18 to 11/30/15 Employer: Self employed Benefits: Phone #: [phone]: MySheryn BisonEff. Date: 08/09/15 Deduct: $1000 (met $1000) Out of Pocket Max: $2000 (met $2000) Life Max: unlimited CIR: 90% SNF: 100% with 60 days max Outpatient: 90% with 20 visit limit Co-Pay: 10% Home Health: 100% with 20 visit limit Co-Pay: none DME: 90% Co-Pay: 10% Providers: in network  Medicaid Application Date: Case Manager:  Disability Application Date: Case Worker:   Emergency Contact Information Contact Information    Name Relation Home Work Mobile   Collins,Evelyn Significant other [phone]   Current Medical History  Patient Admitting Diagnosis: Debility, dysphagia due to CAD, respiratory failure. S/P CABG x 4   History of Present Illness: A [age].o. male with history of HTN, R-CAS who [place] due to STEMI. He was found to have severe CAD on cardiac cath as well as > 80%  R-ICA stenosis. Acute on combined systolic/diastolic CHF with mild volume overload treated with diuretics. He underwent CABG X 4 with placement on IAB by Dr. VaPrescott Gumnd R-CEA by Dr. ChBridgett LarssonPost op had issues with VT/VF on 03/26-3/27 as well as issues with complete HB. IABP replaced or pressure support and requiring shocks as well as antiarrythmic's, external pacer for HB as well as re-intubation. pressors. Dr. MaMercy Mooreonsulted for management of ARF due to ATN and recommended limiting steroids. He was unable to tolerate attempts at vent wean and tracheostomy placed on 04/05 by Dr. YaNelda MarseilleHe has been treated for HCAP and has been on TF due to ongoing post op ileus. Bloody secretions from trach resolved with d/c of heparin and he been off vent X 24 hours with trach stand by. He remains NPO with PMSV trials initiated. Therapy ongoing and CIR recommended due to debility.   Past Medical History  Past Medical History  Diagnosis Date  . Hypertension   . TIA (transient ischemic attack)   . Stenosis of right carotid artery   . Complete heart block (HCRuhenstroth3/14/2017    Family History  family history includes Breast cancer in his sister; Congestive Heart Failure in his father; Failure to thrive in his father; Heart disease in his mother.  Prior Rehab/Hospitalizations: No previous rehab.  Has the patient had major surgery during 100 days prior to admission? No  Current Medications   Current facility-administered medications:  . 0.9 % sodium chloride infusion, , Intravenous, Continuous, PeIvin PootMD, Stopped at 11/15/15 0600 . amiodarone (PACERONE) tablet 200 mg, 200 mg, Oral, BID, DaJolaine ArtistMD, 200 mg at 11/24/15 0916 . antiseptic oral rinse (CPC / CETYLPYRIDINIUM CHLORIDE 0.05%) solution 7 mL, 7 mL, Mouth Rinse, q12n4p, PeIvin PootMD, 7 mL at 11/24/15 1537 . aspirin chewable tablet 81 mg, 81 mg, Oral, Daily, DaLarey DresserMD, 81 mg at 11/24/15  1025 . atorvastatin (LIPITOR) tablet 80 mg, 80 mg, Oral, q1800, Sherren Mocha, MD, 80 mg at 11/23/15 1842 . chlorhexidine (PERIDEX) 0.12 % solution 15 mL, 15 mL, Mouth Rinse, BID, Ivin Poot, MD, 15 mL at 11/24/15 0915 . dextrose 5 % solution, , Intravenous, Continuous, Ivin Poot, MD, Last Rate: 10 mL/hr at 11/24/15 1500 . diphenhydrAMINE (BENADRYL) injection 25 mg, 25 mg, Intravenous, Q8H PRN, Melrose Nakayama, MD, 25 mg at 11/22/15 2215 . enoxaparin (LOVENOX) injection 40 mg, 40 mg, Subcutaneous, Q24H, Ivin Poot, MD, 40 mg at 11/24/15 0915 . feeding supplement (JEVITY 1.2 CAL) liquid 1,000 mL, 1,000 mL, Per Tube, Continuous, Ivin Poot, MD . feeding supplement (PRO-STAT SUGAR FREE 64) liquid 30 mL, 30 mL, Per Tube, TID, Ivin Poot, MD, 30 mL at 11/24/15 1422 . free water 200 mL, 200 mL, Per Tube, Q8H, Ivin Poot, MD . hydrALAZINE (APRESOLINE) tablet 25 mg, 25 mg, Oral, 3 times per day, Ivin Poot, MD, 25 mg at 11/24/15 1422 . levalbuterol (XOPENEX) nebulizer solution 1.25 mg, 1.25 mg, Nebulization, TID, Ivin Poot, MD, 1.25 mg at 11/24/15 1334 . metoCLOPramide (REGLAN) injection 10 mg, 10 mg, Intravenous, Q6H, Ivin Poot, MD, 10 mg at 11/24/15 1538 . ondansetron (ZOFRAN) injection 4 mg, 4 mg, Intravenous, Q6H PRN, Erin R Barrett, PA-C, 4 mg at 11/24/15 0456 . oxyCODONE-acetaminophen (PERCOCET/ROXICET) 5-325 MG per tablet 1 tablet, 1 tablet, Oral, Q4H PRN **AND** oxyCODONE (Oxy IR/ROXICODONE) immediate release tablet 5 mg, 5 mg, Oral, Q3H PRN, Grace Bushy Minor, NP . pantoprazole sodium (PROTONIX) 40 mg/20 mL oral suspension 40 mg, 40 mg, Per Tube, Daily, Ivin Poot, MD, 40 mg at 11/24/15 0914 . potassium chloride 20 MEQ/15ML (10%) solution 40 mEq, 40 mEq, Oral, 3 times per day, Fleet Contras, MD, 40 mEq at 11/24/15 1422 . QUEtiapine (SEROQUEL) tablet 50 mg, 50 mg, Oral, QHS, Ivin Poot, MD, 50 mg at 11/23/15 2045 .  sildenafil (REVATIO) tablet 40 mg, 40 mg, Oral, TID, Jolaine Artist, MD, 40 mg at 11/24/15 0917 . silver sulfADIAZINE (SILVADENE) 1 % cream, , Topical, BID, Corey Harold, NP . sodium chloride (OCEAN) 0.65 % nasal spray 1 spray, 1 spray, Each Nare, PRN, Nani Skillern, PA-C . sodium chloride flush (NS) 0.9 % injection 10-40 mL, 10-40 mL, Intracatheter, Q12H, Ivin Poot, MD, 10 mL at 11/24/15 0937 . sodium chloride flush (NS) 0.9 % injection 10-40 mL, 10-40 mL, Intracatheter, PRN, Ivin Poot, MD . torsemide [facility]) tablet 60 mg, 60 mg, Oral, Daily, Jolaine Artist, MD, 60 mg at 11/24/15 0917 . traMADol (ULTRAM) tablet 50-100 mg, 50-100 mg, Oral, Q4H PRN, Erin R Barrett, PA-C, 50 mg at 11/16/15 1607 . triamcinolone cream (KENALOG) 0.5 %, , Topical, BID, Ivin Poot, MD  Patients Current Diet: Diet NPO time specified  Precautions / Restrictions Precautions Precautions: Sternal, Fall Precaution Comments: instructed pt and wife in rationale for sternal precautions, pt adhering during sit to stand without cues Restrictions Weight Bearing Restrictions: No Other Position/Activity Restrictions: Sternal preacutions   Has the patient had 2 or more falls or a fall with injury in the past year?No  Prior Activity Level Community (5-7x/wk): Went out daily. Worked FT as an Buyer, retail.  Home Assistive Devices / Equipment Home Assistive Devices/Equipment: None Home Equipment: None  Prior Device Use: Indicate devices/aids used by the patient prior to current illness, exacerbation or injury? None  Prior Functional Level Prior Function Level  of Independence: Independent Comments: 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  Self Care: Did the patient need help bathing, dressing, using the toilet or eating? Independent  Indoor Mobility: Did the patient need assistance with walking from room to room (with or without  device)? Independent  Stairs: Did the patient need assistance with internal or external stairs (with or without device)? Independent  Functional Cognition: Did the patient need help planning regular tasks such as shopping or remembering to take medications? Independent  Current Functional Level Cognition  Overall Cognitive Status: Impaired/Different from baseline Difficult to assess due to: Tracheostomy Orientation Level: Oriented X4   Extremity Assessment (includes Sensation/Coordination)  Upper Extremity Assessment: Overall WFL for tasks assessed  Lower Extremity Assessment: Defer to PT evaluation    ADLs  Overall ADL's : Needs assistance/impaired Eating/Feeding: Set up, Sitting Eating/Feeding Details (indicate cue type and reason): ice chips Grooming: Wash/dry hands, Standing, Min guard Upper Body Bathing: Minimal assitance, Sitting Lower Body Bathing: Minimal assistance, Sit to/from stand Upper Body Dressing : Minimal assistance, Sitting Upper Body Dressing Details (indicate cue type and reason): front opening gown Lower Body Dressing: Minimal assistance, Sit to/from stand Lower Body Dressing Details (indicate cue type and reason): required rest break between putting socks on, able to cross foot over opposite knee Toilet Transfer: Min guard Toileting- Clothing Manipulation and Hygiene: Total assistance, Sit to/from stand Functional mobility during ADLs: Min guard (pushing w/c) General ADL Comments: Instructed pt and wife on energy conservation strategies including benefits of sitting to shower.    Mobility  Overal bed mobility: Needs Assistance Bed Mobility: Rolling, Sidelying to Sit Rolling: Supervision Sidelying to sit: HOB elevated, Supervision Sit to supine: Min guard General bed mobility comments: pt in chair    Transfers  Overall transfer level: Needs assistance Equipment used: 1 person hand held assist Transfers: Sit to/from Stand Sit to Stand:  Min guard Stand pivot transfers: Min assist General transfer comment: assist and cues for precautions    Ambulation / Gait / Stairs / Wheelchair Mobility  Ambulation/Gait Ambulation/Gait assistance: Museum/gallery curator (Feet): 300 Feet Assistive device: Rolling walker (2 wheeled) Gait Pattern/deviations: Step-through pattern, Decreased stride length, Trunk flexed General Gait Details: slower pace this session and with some stops due to wheezing, RN aware to call for breathing tx Gait velocity: decreased Gait velocity interpretation: Below normal speed for age/gender    Posture / Balance Balance Overall balance assessment: Needs assistance Sitting-balance support: Feet supported, No upper extremity supported Sitting balance-Leahy Scale: Good Standing balance support: Bilateral upper extremity supported Standing balance-Leahy Scale: Fair Standing balance comment: UE support for ambulation, static balance without support    Special needs/care consideration BiPAP/CPAP No CPM No Continuous Drip IV KVO Dialysis No  Life Vest No Oxygen Yes, trach collar Special Bed No Trach Size Yes, trach Shiley #6 cuffed Wound Vac (area) No  Skin Sternal incision  Bowel mgmt: Last BM 11/24/15 Bladder mgmt:Voiding WDL Diabetic mgmt No    Previous Home Environment Living Arrangements: Spouse/significant other Available Help at Discharge: Family, Available 24 hours/day (wife will take off work to care for pt) Type of Home: Apartment Home Layout: One level Home Access: Stairs to enter Entrance Stairs-Rails: Right, Left Entrance Stairs-Number of Steps: 8+8 Bathroom Shower/Tub: Chiropodist: Handicapped height Home Care Services: No Additional Comments: Limited information due to trach/vent  Discharge Living Setting Plans for Discharge Living Setting: Lives with (comment), Apartment (Lives with girlfriend.) Type of  Home at Discharge:  Apartment (2nd floor apartment.) Discharge Home Layout: One level Discharge Home Access: Stairs to enter Entrance Stairs-Number of Steps: 8 steps, then a landing, then 8 more steps to 2nd level apartment Does the patient have any problems obtaining your medications?: No  Social/Family/Support Systems Patient Roles: Other (Comment) (Lives with girlfriend.) Contact Information: Bobbye Morton - girlfriend Anticipated Caregiver: self and girlfriend Anticipated Caregiver's Contact Information: Estill Bamberg - girlfriend [phone] or [phone] Ability/Limitations of Caregiver: Girlfriend works but has leave until 01/18/16 (unless she goes back to work once patient comes to rehab) Caregiver Availability: Intermittent Discharge Plan Discussed with Primary Caregiver: Yes Is Caregiver In Agreement with Plan?: Yes Does Caregiver/Family have Issues with Lodging/Transportation while Pt is in Rehab?: No  Goals/Additional Needs Patient/Family Goal for Rehab: PT/OT/St mod I goals Expected length of stay: 7 days Cultural Considerations: None Dietary Needs: NPO, has tube feedings Equipment Needs: TBD Pt/Family Agrees to Admission and willing to participate: Yes Program Orientation Provided & Reviewed with Pt/Caregiver Including Roles & Responsibilities: Yes  Decrease burden of Care through IP rehab admission: N/A  Possible need for SNF placement upon discharge: Not planned  Patient Condition: This patient's medical and functional status has changed since the consult dated: 11/19/15 in which the Rehabilitation Physician determined and documented that the patient's condition is appropriate for intensive rehabilitative care in an inpatient rehabilitation facility. See "History of Present Illness" (above) for medical update. Functional changes are: Currently requiring min assist to ambulate 300 feet RW. Patient's medical and functional status update has been discussed with the  Rehabilitation physician and patient remains appropriate for inpatient rehabilitation. Will admit to inpatient rehab today.  Preadmission Screen Completed By: Retta Diones, 11/24/2015 3:38 PM ______________________________________________________________________  Discussed status with Dr. Naaman Plummer on 11/24/15 at 1537 and received telephone approval for admission today.  Admission Coordinator: Retta Diones, time1538/Date04/18/17          Cosigned by: Meredith Staggers, MD at 11/24/2015 4:22 PM  Revision History     Date/Time User Provider Type Action   11/24/2015 4:22 PM Meredith Staggers, MD Physician Cosign   11/24/2015 3:39 PM Retta Diones, RN Rehab Admission Coordinator Sign

## 2015-11-25 NOTE — Progress Notes (Signed)
[place] PHYSICAL MEDICINE & REHABILITATION     PROGRESS NOTE    Subjective/Complaints: Upset about not getting enough ice chips and that he was woken up late to administer reglan. Also upset that his MBS wasn't scheduled today. NGT came out. No problems breathing, no coughing.   ROS: Pt denies fever, rash/itching, headache, blurred or double vision, nausea, vomiting, abdominal pain, diarrhea,   shortness of breath, palpitations, dysuria, dizziness, neck or back pain, bleeding, anxiety, or depression   Objective: Vital Signs: Blood pressure 110/64, pulse 80, temperature 98.8 F (37.1 C), temperature source Oral, resp. rate 20, height 5\' 10"  (1.778 m), weight 102.2 kg (225 lb 5 oz), SpO2 96 %. Dg Chest 2 View  11/24/2015  CLINICAL DATA:  Respiratory failure. EXAM: CHEST  2 VIEW COMPARISON:  11/23/2015 and 11/21/2015. FINDINGS: Tracheostomy, feeding tube and right arm PICC appear unchanged. There appears to be additional tubing overlying the upper right chest on the frontal examination, not clearly seen on the lateral view. The heart size and mediastinal contours are stable status post CABG. Left pleural effusion and left basilar airspace disease have not significantly changed. The right lung is clear. There is no pneumothorax. IMPRESSION: No significant change in left pleural effusion and left basilar airspace disease. Electronically Signed   By: Carey Bullocks M.D.   On: 11/24/2015 08:16   Dg Abd 1 View  11/24/2015  CLINICAL DATA:  Feeding tube placement.  History of ileus. EXAM: ABDOMEN - 1 VIEW COMPARISON:  Single-view of the abdomen 11/19/2015 and 11/18/2015. FINDINGS: Feeding tube is in place with the tip in the third portion of the duodenum. Gaseous distention of small and large bowel persists and appears mildly improved. IMPRESSION: Feeding tube tip is in the third portion of the duodenum. Mild improvement in gaseous distention of small and large bowel most compatible with ileus.  Electronically Signed   By: Drusilla Kanner M.D.   On: 11/24/2015 18:54   Dg Chest Port 1 View  11/23/2015  CLINICAL DATA:  Heart failure. EXAM: PORTABLE CHEST 1 VIEW COMPARISON:  November 21, 2015. FINDINGS: Stable cardiomegaly. Status post Coronary artery bypass graft. Tracheostomy tube and right-sided PICC line are unchanged in position. No pneumothorax is noted. Stable left basilar opacity is noted concerning for infiltrate or atelectasis with associated pleural effusion. Mild central pulmonary vascular congestion is noted. Bony thorax is unremarkable. IMPRESSION: Stable support apparatus. Stable cardiomegaly. Stable left basilar opacity is noted consistent with atelectasis or infiltrate with associated pleural effusion. Electronically Signed   By: Lupita Raider, M.D.   On: 11/23/2015 10:12    Recent Labs  11/24/15 0515 11/25/15 0442  WBC 8.6 6.4  HGB 10.9* 9.8*  HCT 36.0* 32.1*  PLT 226 187    Recent Labs  11/24/15 0515 11/25/15 0442  NA 141 141  K 5.0 3.8  CL 103 103  GLUCOSE 104* 121*  BUN 48* 46*  CREATININE 1.51* 1.56*  CALCIUM 8.7* 8.5*   CBG (last 3)   Recent Labs  11/24/15 0309 11/24/15 0725 11/24/15 1151  GLUCAP 98 90 101*    Wt Readings from Last 3 Encounters:  11/25/15 102.2 kg (225 lb 5 oz)  11/24/15 103.7 kg (228 lb 9.9 oz)  10/20/15 116.121 kg (256 lb)    Physical Exam:  Constitutional: He is oriented to person, place, and time. He appears well-developed and well-nourished.  Panda in nares. Trach with PMSV and strong wet voice noted.  HENT:  Head: Normocephalic and atraumatic.  Mouth/Throat:  Oropharynx is clear and moist.  Eyes: Conjunctivae are normal. Pupils are equal, round, and reactive to light.  Neck: Neck supple.  #6 cuffed trach in place. strong voice, no cough Cardiovascular: Normal rate and regular rhythm. Exam reveals distant heart sounds.  Respiratory: Effort normal. No respiratory distress. He has decreased breath sounds in  the left middle field and the left lower field. He has no wheezes. He has rhonchi.  GI: He exhibits distension. Bowel sounds are increased. There is no tenderness. There is no rebound.  Protuberant.  Musculoskeletal: He exhibits edema.  BLE with edema tibially and 2+ pedally with unna boots in place.  Neurological: He is alert and oriented to person, place, and time.  Wet voice but speech clear. Follows commands without difficulty. UE 3-4/5 prox to distal. LE: 3/5 HF, 3+ KE and 4/5 ADF/PF. No gross sensory deficits. Cognitively appropriate with reasonable insight and awareness.  Skin: Skin is warm and dry.  Sternal incision clean and dry with scabs. Rash on left buttocks--drug eruption per wife. Irritated area intergluteal cleft. Right leg donor site with staples and intact  Psychiatric: He has a normal mood and affect. His behavior is irritable, impulsive   Assessment/Plan: 1. Weakness, dysphagia, and functional deficits secondary to multiple medical issues which require 3+ hours per day of interdisciplinary therapy in a comprehensive inpatient rehab setting. Physiatrist is providing close team supervision and 24 hour management of active medical problems listed below. Physiatrist and rehab team continue to assess barriers to discharge/monitor patient progress toward functional and medical goals.  Function:  Bathing Bathing position      Bathing parts      Bathing assist        Upper Body Dressing/Undressing Upper body dressing                    Upper body assist        Lower Body Dressing/Undressing Lower body dressing                                  Lower body assist        Toileting Toileting          Toileting assist     Transfers Chair/bed transfer             Locomotion Ambulation           Wheelchair          Cognition Comprehension Comprehension assist level: Understands basic 75 - 89% of the time/ requires cueing 10  - 24% of the time  Expression Expression assist level: Expresses basic 90% of the time/requires cueing < 10% of the time.  Social Interaction Social Interaction assist level: Interacts appropriately 75 - 89% of the time - Needs redirection for appropriate language or to initiate interaction.  Problem Solving Problem solving assist level: Solves basic 75 - 89% of the time/requires cueing 10 - 24% of the time  Memory Memory assist level: Recognizes or recalls 90% of the time/requires cueing < 10% of the time   Medical Problem List and Plan: 1. Weakness and functional deficits secondary to Debility.   -begin therapies today 2. DVT Prophylaxis/Anticoagulation: Pharmaceutical: Lovenox indicated 3. Pain Management: tylenol prn/percocet 4. Mood: LCSW to follow for evaluation and support.  5. Neuropsych: This patient is capable of making decisions on his own behalf. 6. Skin/Wound Care: Monitor wound for healing.  7. Fluids/Electrolytes/Nutrition: On  tube feeds. Monitor renal insufficiency with labs in am. 8. CAD s/p CABG: Continue sternal precautions. Monitor for symptoms with increased activity. On ASA and statin.  9. HTN: Monitor BP bid.  10. Acute combined systolic and diastolic CHF: Daily weights. Demadex to be increased in am. On hydralazine, demadex, sildenafil and statin.  11. Acute on chronic respiratory failure/OSA/OHS: continue ATC with cuffed #6 trach per PCCM.   -voice and coughing/secretions MUCH improved---ready for #4 trach  -Continue xopenex every 6 hours.  12. Shocked liver: follow up labs reviewed and all wnl 13. Adynamic ileus: Continue reglan--change schedule. KUB still with ileus but with some improvement 14. ABLA: Stable.  15. Complete HB: resolved. Had V Fib arrest--may need ICD v/s Life Vest at discharge? 16. A flutter: In NSR on amiodarone--no anticoagulation unless A fib/A flutter recurs.  17. Acute renal failure: Likely due to diuresis. Continue  water flushes.   -kdur daily, K+ level normal  -BUN/Cr stable 18. Dysphagia:  -NGT came out. Hopeful for MBS today   LOS (Days) 1 A FACE TO FACE EVALUATION WAS PERFORMED  Rukia Mcgillivray T 11/25/2015 9:29 AM

## 2015-11-25 NOTE — Progress Notes (Signed)
RN went to administer 0000 med. Pt very upset he was woken up for a medication. Pt began grabbing all of his tubes saying he was going to go home. Wife stated "you cannot go home with all of this." Pt told RN to hurry and administer the medication and leave the room. Medication given. Will continue to monitor. Rudie Meyer, RN

## 2015-11-25 NOTE — Progress Notes (Signed)
Initial Nutrition Assessment  DOCUMENTATION CODES:   Obesity unspecified  INTERVENTION:  Provide Ensure Enlive po BID, each supplement provides 350 kcal and 20 grams of protein.  Provide 30 ml Prostat po once daily, each supplement provides 100 kcal and 15 grams of protein.   Encourage adequate PO intake.   Heart healthy diet education given.   NUTRITION DIAGNOSIS:   Inadequate oral intake related to dysphagia as evidenced by meal completion < 50%.  GOAL:   Patient will meet greater than or equal to 90% of their needs  MONITOR:   PO intake, Supplement acceptance, Diet advancement, Weight trends, Labs, I & O's  REASON FOR ASSESSMENT:   Consult Diet education  ASSESSMENT:   55 y.o. male with history of HTN, R-CAS who was admitted via [place] [facility] with chest pain due to STEMI. He was found to have severe CAD on cardiac cath as well as > 80% R-ICA stenosis. Acute on combined systolic/diastolic CHF with mild volume overload treated with diuretics. He underwent CABG X 4 with placement  Extubated with trach placed. Cortrak NGT for nutrition support. NGT pulled out 4/18. Diet has been advanced.  Meal completion at lunch today was 25% per pt. He reports he was able to eat a couple of bites. He reports eating has been a big change as he had been getting tube feeding since initial acute hospitalization. He reports he will try to eat more at dinner time today. Pt reports eating well PTA with no other difficulties. Per Epic weight records, pt with a 12% weight loss in 1 month. Weight loss likely associated with fluid status. Pt is agreeable to nutritional supplements to aid in caloric and protein needs. RD to order. Noted tube feeding formula is still active. RD to discontinue as NGT out. RD was additionally consulted for a diet education. Education given and pt and wife at bedside.   Pt with no observed significant fat or muscle mass loss.   Labs and medications reviewed.   Diet  Order:  DIET DYS 2 Room service appropriate?: Yes; Fluid consistency:: Thin  Skin:  Reviewed, no issues  Last BM:  4/19  Height:   Ht Readings from Last 1 Encounters:  11/24/15 5\' 10"  (1.778 m)    Weight:   Wt Readings from Last 1 Encounters:  11/25/15 225 lb 5 oz (102.2 kg)    Ideal Body Weight:  75.4 kg  BMI:  Body mass index is 32.33 kg/(m^2).  Estimated Nutritional Needs:   Kcal:  1900-2100  Protein:  100-115 grams  Fluid:  1.9 - 2.1 L/day  EDUCATION NEEDS:   Education needs addressed  Roslyn Smiling, MS, RD, LDN Pager # [phone] After hours/ weekend pager # [phone]

## 2015-11-25 NOTE — Plan of Care (Signed)
Problem: Food- and Nutrition-Related Knowledge Deficit (NB-1.1) Goal: Nutrition education Formal process to instruct or train a patient/client in a skill or to impart knowledge to help patients/clients voluntarily manage or modify food choices and eating behavior to maintain or improve health. Outcome: Completed/Met Date Met:  11/25/15 Nutrition Education Note  RD consulted for nutrition diet education.  Lipid Panel     Component Value Date/Time    CHOL 150 10/21/2015 0154    TRIG 171* 10/21/2015 0154    HDL 18* 10/21/2015 0154    CHOLHDL 8.3 10/21/2015 0154    VLDL 34 10/21/2015 0154    LDLCALC 98 10/21/2015 0154    RD provided "Heart Healthy Nutrition Therapy" handout from the Academy of Nutrition and Dietetics. Reviewed patient's dietary recall. Provided examples on ways to decrease sodium and fat intake in diet. Discouraged intake of processed foods and use of salt shaker. Encouraged fruits and vegetables as well as whole grain sources of carbohydrates to maximize fiber intake. Teach back method used.  Expect good compliance.  Corrin Parker, MS, RD, LDN Pager # [phone] After hours/ weekend pager # [phone]

## 2015-11-25 NOTE — Evaluation (Signed)
Occupational Therapy Assessment and Plan  Patient Details  Name: Christopher [name] MRN: [id number] Date of Birth: Dec 10, 1960  OT Diagnosis: muscle weakness (generalized) Rehab Potential: Rehab Potential (ACUTE ONLY): Excellent ELOS: 7-10 days   Today's Date: 11/25/2015 OT Individual Time: 1100-1200 OT Individual Time Calculation (min): 60 min     Problem List:  Patient Active Problem List   Diagnosis Date Noted  . Debility 11/24/2015  . Coronary artery disease involving native coronary artery of native heart without angina pectoris   . VT (ventricular tachycardia) ([place])   . Ileus ([place])   . Hyperosmolality and/or hypernatremia   . Acute respiratory failure with hypoxia ([place])   . Chronic obstructive pulmonary disease ([place])   . Acute renal failure ([place])   . Ventilator dependence ([place])   . Tracheostomy status ([place])   . S/P CABG x 4 10/27/2015  . CAD (coronary artery disease)   . Hypertensive heart disease with heart failure ([place])   . Carotid artery stenosis   . Acute combined systolic and diastolic heart failure ([place]) 10/20/2015  . Complete heart block ([place]) 10/20/2015  . Acute MI, inferior wall, initial episode of care ([place]) 10/20/2015  . TIA (transient ischemic attack) 12/25/2014  . Essential hypertension 12/25/2014  . Hyperlipidemia 12/25/2014    Past Medical History:  Past Medical History  Diagnosis Date  . Hypertension   . TIA (transient ischemic attack)   . Stenosis of right carotid artery   . Complete heart block ([place]) 10/20/2015   Past Surgical History:  Past Surgical History  Procedure Laterality Date  . Arm surgery Right 2003  . Eye surgery    . Laceration repair    . Cardiac catheterization N/A 10/20/2015    Procedure: Left Heart Cath and Coronary Angiography;  Surgeon: Sherren Mocha, MD;  Location: [facility];  Service: Cardiovascular;  Laterality: N/A;  . Coronary artery bypass graft N/A 10/27/2015    Procedure: CORONARY ARTERY BYPASS GRAFTING  (CABG) x four, using left internal mammary artery and rt leg greater saphenous vein harvested endoscopically;  Surgeon: Ivin Poot, MD;  Location: [facility];  Service: Open Heart Surgery;  Laterality: N/A;  . Tee without cardioversion N/A 10/27/2015    Procedure: TRANSESOPHAGEAL ECHOCARDIOGRAM (TEE);  Surgeon: Ivin Poot, MD;  Location: [facility];  Service: Open Heart Surgery;  Laterality: N/A;  . Endarterectomy Right 10/27/2015    Procedure: RIGHT ENDARTERECTOMY CAROTID with patch angioplasty using Xenosure bovine pericardium patch;  Surgeon: Conrad Lenawee, MD;  Location: [facility];  Service: Vascular;  Laterality: Right;  . Cardiac catheterization N/A 11/02/2015    Procedure: Right/Left Heart Cath and Coronary/Graft Angiography;  Surgeon: Larey Dresser, MD;  Location: [facility];  Service: Cardiovascular;  Laterality: N/A;    Assessment & Plan Clinical Impression: Christopher [name] is a 55 y.o. male with history of HTN, R-CAS who was admitted via [place] with chest pain due to STEMI. He was found to have severe CAD on cardiac cath as well as > 80% R-ICA stenosis. Acute on combined systolic/diastolic CHF with mild volume overload treated with diuretics. He underwent CABG X 4 with placement on IAB by Dr. Prescott Gum and R-CEA by Dr. Bridgett Larsson. Post op had issues with VT/VF on 03/26-3/27 as well as issues with complete HB. IABP replaced or pressure support and requiring shocks as well as antiarrythmic's, external pacer for HB as well as re-intubation. pressors. Dr. Mercy Moore consulted for management of ARF due to ATN and  recommended limiting steroids. He was unable to tolerate attempts at vent wean and tracheostomy placed on 04/05 by Dr. Nelda Marseille. He has been treated for HCAP and post op ileus treated with reglan. Bloody secretions from trach resolved with d/c of heparin and he was extubated to ATC. He is tolerating cuffed #6 trach but continues to have increased secretions therefore to  continue on current trach until secretions improve. Sputum cultures 04/12 negative and he has completed treatment for possible HCAP. PMSV trials initiated with trials of ice cubes. He continues on tube feeds for nutritional support and has had wheezing with ambulation. Left pleural effusion persistent and Demadex adjusted. Therapy ongoing and CIR recommended due to debility.  Patient transferred to CIR on 11/24/2015 .    Patient currently requires min with basic self-care skills secondary to muscle weakness, decreased cardiorespiratoy endurance and decreased standing balance and decreased balance strategies.  Prior to hospitalization, patient could complete ADLs/IADLs with independent .  Patient will benefit from skilled intervention to increase independence with basic self-care skills and increase level of independence with iADL prior to discharge home with care partner.  Anticipate patient will require intermittent supervision and follow up home health.  OT - End of Session Activity Tolerance: Tolerates 10 - 20 min activity with multiple rests Endurance Deficit: Yes OT Assessment Rehab Potential (ACUTE ONLY): Excellent OT Patient demonstrates impairments in the following area(s): Balance;Endurance;Safety OT Basic ADL's Functional Problem(s): Bathing;Dressing;Toileting OT Advanced ADL's Functional Problem(s): Simple Meal Preparation OT Transfers Functional Problem(s): Toilet;Tub/Shower OT Additional Impairment(s): None OT Plan OT Intensity: Minimum of 1-2 x/day, 45 to 90 minutes OT Frequency: 5 out of 7 days OT Duration/Estimated Length of Stay: 7-10 days OT Treatment/Interventions: Balance/vestibular training;Discharge planning;Community reintegration;DME/adaptive equipment instruction;Functional mobility training;Pain management;Patient/family education;Therapeutic Activities;Therapeutic Exercise;Self Care/advanced ADL retraining;UE/LE Strength taining/ROM;UE/LE Coordination activities OT  Self Feeding Anticipated Outcome(s): Independent OT Basic Self-Care Anticipated Outcome(s): Mod I OT Toileting Anticipated Outcome(s): Mod I OT Bathroom Transfers Anticipated Outcome(s): Mod I OT Recommendation Patient destination: Home Follow Up Recommendations: Home health OT;Outpatient OT Equipment Recommended: To be determined   Skilled Therapeutic Intervention Pt seen for OT eval and ADL bathing/dressing session. Pt received in recliner with hand off from PT. Pt denied pain, however, voiced increased fatigue from previous session. Pt declined showering task this session due to fatigue. He bathed and completed oral care standing at sink with steadying assist. He was taken to ADL apartment where he completed simulated tub/shower transfer using tub bench. Completed with min A following demonstration of technique. He then completed simulated toilet transfer/ toileting task with steadying assist. Pt ambulated back to room at end of session with CGA. Pt with 2 LOB episodes, requiring min A to regain balance.  Pt left sitting in recliner at end of session, all needs in reach, RN present.  Pt and caregiver educated throughout session regarding energy conservation, role of OT, OT goals, CIR, and d/c planning.   OT Evaluation Precautions/Restrictions  Precautions Precautions: Sternal;Fall Restrictions Weight Bearing Restrictions: No Other Position/Activity Restrictions: Sternal preacutions General Chart Reviewed: Yes Vital Signs Therapy Vitals Resp: 18 Oxygen Therapy SpO2: 98 % O2 Device: Not Delivered Pain Pain Assessment Pain Assessment: No/denies pain Pain Score: 0-No pain Home Living/Prior Functioning Home Living Family/patient expects to be discharged to:: Private residence Living Arrangements: Spouse/significant other Available Help at Discharge: Family, Available 24 hours/day (Wife able to take time off work to assist) Type of Home: Apartment Home Access: Stairs to  enter [employer] of Steps: 8+8 Entrance Stairs-Rails: Right  Home Layout: One level Bathroom Shower/Tub: Chiropodist: Handicapped height Bathroom Accessibility: Yes IADL History Current License: Yes Mode of Transportation: Car Occupation: Full time employment Prior Function Level of Independence: Independent with basic ADLs, Independent with gait, Independent with transfers  Able to Take Stairs?: Yes Driving: Yes Vocation: Full time employment Vocation Requirements: Immunologist. Computer work.  Leisure: Hobbies-yes (Comment) Comments: Baking, traveling Vision/Perception  Vision- History Baseline Vision/History: Wears glasses Wears Glasses: At all times Patient Visual Report: No change from baseline Vision- Assessment Vision Assessment?: No apparent visual deficits  Cognition Overall Cognitive Status: Impaired/Different from baseline Arousal/Alertness: Awake/alert Orientation Level: Person;Place;Situation Person: Oriented Place: Oriented Situation: Oriented Year: 2017 Month: April Day of Week: Correct Memory: Appears intact Immediate Memory Recall: Sock;Blue;Bed Memory Recall: Sock;Blue;Bed Memory Recall Sock: Without Cue Memory Recall Blue: Without Cue Memory Recall Bed: Without Cue Awareness: Appears intact Problem Solving: Appears intact Safety/Judgment: Appears intact Sensation Sensation Light Touch: Appears Intact Hot/Cold: Appears Intact Proprioception: Appears Intact Coordination Gross Motor Movements are Fluid and Coordinated: No Fine Motor Movements are Fluid and Coordinated: Yes Finger Nose Finger Test: Mild past shooting on the LUE, R UE WFL.  Motor  Motor Motor: Within Functional Limits Motor - Skilled Clinical Observations: Mild decreased coordination with gait.  Mobility  Bed Mobility Bed Mobility: Rolling Right;Rolling Left;Supine to Sit;Sit to Supine Rolling Right: 5: Supervision Rolling Right  Details: Verbal cues for precautions/safety Rolling Left: 5: Supervision Rolling Left Details: Verbal cues for precautions/safety Supine to Sit: 4: Min assist Supine to Sit Details: Verbal cues for technique;Verbal cues for precautions/safety;Verbal cues for safe use of DME/AE;Tactile cues for placement Sit to Supine: 4: Min guard Transfers Sit to Stand: 5: Supervision Sit to Stand Details: Verbal cues for precautions/safety Stand to Sit: 5: Supervision Stand to Sit Details (indicate cue type and reason): Verbal cues for precautions/safety  Trunk/Postural Assessment  Cervical Assessment Cervical Assessment: Within Functional Limits Thoracic Assessment Thoracic Assessment: Within Functional Limits Lumbar Assessment Lumbar Assessment: Within Functional Limits Postural Control Postural Control: Within Functional Limits  Balance Balance Balance Assessed: Yes Standardized Balance Assessment Standardized Balance Assessment: Berg Balance Test Berg Balance Test Sit to Stand: Able to stand without using hands and stabilize independently Standing Unsupported: Able to stand safely 2 minutes Sitting with Back Unsupported but Feet Supported on Floor or Stool: Able to sit safely and securely 2 minutes Stand to Sit: Sits safely with minimal use of hands Transfers: Able to transfer safely, minor use of hands Standing Unsupported with Eyes Closed: Able to stand 10 seconds with supervision Standing Ubsupported with Feet Together: Needs help to attain position but able to stand for 30 seconds with feet together From Standing, Reach Forward with Outstretched Arm: Can reach confidently >25 cm (10") From Standing Position, Pick up Object from Floor: Able to pick up shoe, needs supervision From Standing Position, Turn to Look Behind Over each Shoulder: Looks behind one side only/other side shows less weight shift Turn 360 Degrees: Able to turn 360 degrees safely but slowly Standing Unsupported,  Alternately Place Feet on Step/Stool: Able to complete >2 steps/needs minimal assist Standing Unsupported, One Foot in Front: Able to plae foot ahead of the other independently and hold 30 seconds Standing on One Leg: Able to lift leg independently and hold 5-10 seconds Total Score: 43 Static Sitting Balance Static Sitting - Level of Assistance: 7: Independent Dynamic Sitting Balance Dynamic Sitting - Level of Assistance: 5: Stand by assistance Static Standing Balance Static Standing - Balance Support:  During functional activity;No upper extremity supported Static Standing - Level of Assistance: 5: Stand by assistance Dynamic Standing Balance Dynamic Standing - Balance Support: During functional activity;No upper extremity supported Dynamic Standing - Level of Assistance: 4: Min assist Extremity/Trunk Assessment RUE Assessment RUE Assessment:  (WFL and tested 5/5, however, pt reports feeling weaker than at baseline.) LUE Assessment LUE Assessment: Within Functional Limits (WFL and tested 5/5, however, pt reports feeling weaker than at baseline. )   See Function Navigator for Current Functional Status.   Refer to Care Plan for Long Term Goals  Recommendations for other services: None  Discharge Criteria: Patient will be discharged from OT if patient refuses treatment 3 consecutive times without medical reason, if treatment goals not met, if there is a change in medical status, if patient makes no progress towards goals or if patient is discharged from hospital.  The above assessment, treatment plan, treatment alternatives and goals were discussed and mutually agreed upon: by patient  Ernestina Patches 11/25/2015, 12:54 PM

## 2015-11-25 NOTE — Progress Notes (Signed)
Christopher Oyster, MD Physician Signed Physical Medicine and Rehabilitation Consult Note 11/19/2015 9:13 AM  Related encounter: Admission (Discharged) from 10/20/2015 in [facility] 3S STEPDOWN    Expand All Collapse All        Physical Medicine and Rehabilitation Consult  Reason for Consult: Debility Referring Physician: Dr. Donata Clay   HPI: Christopher [name] is a 55 y.o. male with history of HTN, R-CAS who was admitted via [place] [facility] with chest pain due to STEMI. He was found to have severe CAD on cardiac cath as well as > 80% R-ICA stenosis. Acute on combined systolic/diastolic CHF with mild volume overload treated with diuretics. He underwent CABG X 4 with placement on IAB by Dr. Donata Clay and R-CEA by Dr. Imogene Burn. Post op had issues with VT/VF on 03/26-3/27 as well as issues with complete HB. IABP replaced or pressure support and requiring shocks as well as antiarrythmic's, external pacer for HB as well as re-intubation. pressors. Dr. Briant Cedar consulted for management of ARF due to ATN and recommended limiting steroids. He was unable to tolerate attempts at vent wean and tracheostomy placed on 04/05 by Dr. Molli Knock. He has been treated for HCAP and has been on TF due to ongoing post op ileus. Bloody secretions from trach resolved with d/c of heparin and he been off vent X 24 hours with trach stand by. He remains NPO with PMSV trials initiated. Therapy ongoing and CIR recommended due to debility.    Review of Systems  Constitutional: Negative for fever.  HENT: Positive for congestion.  Eyes: Negative for blurred vision.  Respiratory: Positive for cough and hemoptysis.  Cardiovascular: Positive for chest pain.  Gastrointestinal: Negative for blood in stool.  Genitourinary: Negative for dysuria.  Musculoskeletal: Positive for myalgias.  Skin: Negative for itching.  Neurological: Negative for dizziness.  Psychiatric/Behavioral: Negative for hallucinations.        Past Medical History  Diagnosis Date  . Hypertension   . TIA (transient ischemic attack)   . Stenosis of right carotid artery   . Complete heart block (HCC) 10/20/2015    Past Surgical History  Procedure Laterality Date  . Arm surgery Right 2003  . Eye surgery    . Laceration repair    . Cardiac catheterization N/A 10/20/2015    Procedure: Left Heart Cath and Coronary Angiography; Surgeon: Tonny Bollman, MD; Location: [facility] INVASIVE CV LAB; Service: Cardiovascular; Laterality: N/A;  . Coronary artery bypass graft N/A 10/27/2015    Procedure: CORONARY ARTERY BYPASS GRAFTING (CABG) x four, using left internal mammary artery and rt leg greater saphenous vein harvested endoscopically; Surgeon: Kerin Perna, MD; Location: [facility] OR; Service: Open Heart Surgery; Laterality: N/A;  . Tee without cardioversion N/A 10/27/2015    Procedure: TRANSESOPHAGEAL ECHOCARDIOGRAM (TEE); Surgeon: Kerin Perna, MD; Location: [facility] OR; Service: Open Heart Surgery; Laterality: N/A;  . Endarterectomy Right 10/27/2015    Procedure: RIGHT ENDARTERECTOMY CAROTID with patch angioplasty using Xenosure bovine pericardium patch; Surgeon: Fransisco Hertz, MD; Location: [facility] OR; Service: Vascular; Laterality: Right;  . Cardiac catheterization N/A 11/02/2015    Procedure: Right/Left Heart Cath and Coronary/Graft Angiography; Surgeon: Laurey Morale, MD; Location: [facility] INVASIVE CV LAB; Service: Cardiovascular; Laterality: N/A;    Family History  Problem Relation Age of Onset  . Heart disease Mother   . Failure to thrive Father   . Congestive Heart Failure Father     Deceased  . Breast cancer Sister     Living  Social History: Married. Wife works days. Per reports that he quit smoking about 13 years ago. He has never used smokeless tobacco. Per reports that he uses illicit drugs (Marijuana) regularly.  Per reports that he does not drink alcohol.     Allergies  Allergen Reactions  . Losartan Shortness Of Breath  . Elita Quick [Ceftazidime] Rash    Rash with blisters  . Lisinopril Rash    Medications Prior to Admission  Medication Sig Dispense Refill  . albuterol (PROAIR HFA) 108 (90 Base) MCG/ACT inhaler Inhale 2 puffs into the lungs every 6 (six) hours as needed. Shortness of breath    . aspirin 325 MG tablet Take 325 mg by mouth daily.    Marland Kitchen lisinopril (PRINIVIL,ZESTRIL) 20 MG tablet Take 1 tablet (20 mg total) by mouth daily. 30 tablet 5  . lovastatin (MEVACOR) 20 MG tablet Take 1 tablet (20 mg total) by mouth at bedtime. 30 tablet 5  . [EXPIRED] predniSONE (DELTASONE) 10 MG tablet Take 1 tablet by mouth taper from 4 doses each day to 1 dose and stop.      Home: Home Living Family/patient expects to be discharged to:: Inpatient rehab Living Arrangements: Spouse/significant other Available Help at Discharge: Family (wife works) Additional Comments: Limited information due to trach/vent  Functional History: Prior Function Level of Independence: Independent Comments: States he worked PTA Functional Status:  Mobility: Bed Mobility Overal bed mobility: Needs Assistance Bed Mobility: Sit to Supine Rolling: Min assist Sidelying to sit: Min assist, HOB elevated, +2 for safety/equipment Sit to supine: Min guard General bed mobility comments: Attempted to cue pt for sternal precautions, but pt waves PT away and throws himself back to bed.  Transfers Overall transfer level: Needs assistance Equipment used: Pushed w/c Transfers: Sit to/from Stand Sit to Stand: Min assist General transfer comment: Cues for sternal precautions and UEs to knees, but pt not following and attempts to grab W/C and pull himself up. 2 attempts to come to standing with pt not following directions either time.  Ambulation/Gait Ambulation/Gait assistance:  Min assist Ambulation Distance (Feet): 150 Feet Assistive device: (pushing W/C) Gait Pattern/deviations: Step-through pattern, Decreased stride length, Trunk flexed General Gait Details: pt leans on W/C and moving quickly at times. Multiple cues for pacing and attending to safety as pt seems to rush to get ambulation over with. pt denies fatigue and pain when asked if those were reasons for rushing.  Gait velocity: decreased Gait velocity interpretation: Below normal speed for age/gender    ADL:    Cognition: Cognition Overall Cognitive Status: Difficult to assess Orientation Level: Oriented X4 Cognition Arousal/Alertness: Awake/alert Behavior During Therapy: Flat affect, Impulsive (Seems frustrated and easily irritated) Overall Cognitive Status: Difficult to assess Difficult to assess due to: Tracheostomy   Blood pressure 125/85, pulse 80, temperature 98.3 F (36.8 C), temperature source Oral, resp. rate 21, height 5' 10.5" (1.791 m), weight 107.5 kg (236 lb 15.9 oz), SpO2 97 %. Physical Exam  Constitutional: He is oriented to person, place, and time. He appears well-developed.  HENT:  Mouth/Throat: No oropharyngeal exudate.  NGT in place  Eyes: EOM are normal. Pupils are equal, round, and reactive to light.  Neck:  TRACH in place, blood tinged sputum  Cardiovascular:  Tachycardic Respiratory: No respiratory distress. He has rales.  GI: He exhibits distension.  Neurological: He is alert and oriented to person, place, and time.  UE grossly 4/5 prox to distal. LE: 3+ HF, 4-KE, 4 ADF/PF  Skin:  Chest incision clean, RLE  donor sites clean with staples  Psychiatric:  Flat    Lab Results Last 24 Hours    Results for orders placed or performed during the hospital encounter of 10/20/15 (from the past 24 hour(s))  Culture, respiratory (NON-Expectorated) Status: None (Preliminary result)   Collection Time: 11/18/15 11:40 AM  Result Value Ref Range    Specimen Description TRACHEAL ASPIRATE    Special Requests Normal    Gram Stain      RARE WBC PRESENT, PREDOMINANTLY PMN RARE SQUAMOUS EPITHELIAL CELLS PRESENT RARE GRAM POSITIVE COCCI IN PAIRS Performed at [facility]    Culture NO GROWTH Performed at [facility]     Report Status PENDING   Glucose, capillary Status: None   Collection Time: 11/18/15 11:43 AM  Result Value Ref Range   Glucose-Capillary 88 65 - 99 mg/dL  Glucose, capillary Status: None   Collection Time: 11/18/15 3:32 PM  Result Value Ref Range   Glucose-Capillary 90 65 - 99 mg/dL   Comment 1 Notify RN   Glucose, capillary Status: None   Collection Time: 11/18/15 7:13 PM  Result Value Ref Range   Glucose-Capillary 92 65 - 99 mg/dL   Comment 1 Capillary Specimen    Comment 2 Notify RN    Comment 3 Document in Chart   Glucose, capillary Status: None   Collection Time: 11/18/15 11:40 PM  Result Value Ref Range   Glucose-Capillary 85 65 - 99 mg/dL   Comment 1 Capillary Specimen    Comment 2 Notify RN    Comment 3 Document in Chart   Glucose, capillary Status: None   Collection Time: 11/19/15 3:54 AM  Result Value Ref Range   Glucose-Capillary 86 65 - 99 mg/dL   Comment 1 Capillary Specimen    Comment 2 Notify RN    Comment 3 Document in Chart   Magnesium Status: None   Collection Time: 11/19/15 4:00 AM  Result Value Ref Range   Magnesium 2.1 1.7 - 2.4 mg/dL  CBC Status: Abnormal   Collection Time: 11/19/15 4:00 AM  Result Value Ref Range   WBC 11.5 (H) 4.0 - 10.5 K/uL   RBC 3.84 (L) 4.22 - 5.81 MIL/uL   Hemoglobin 10.5 (L) 13.0 - 17.0 g/dL   HCT 16.1 (L) 09.6 - 04.5 %   MCV 92.4 78.0 - 100.0 fL   MCH 27.3 26.0 - 34.0 pg   MCHC 29.6 (L) 30.0 - 36.0 g/dL   RDW 40.9 (H) 81.1 - 91.4 %    Platelets 267 150 - 400 K/uL  Carboxyhemoglobin Status: Abnormal   Collection Time: 11/19/15 4:00 AM  Result Value Ref Range   Total hemoglobin 11.0 (L) 13.5 - 18.0 g/dL   O2 Saturation 78.2 %   Carboxyhemoglobin 1.8 (H) 0.5 - 1.5 %   Methemoglobin 0.8 0.0 - 1.5 %  Renal function panel Status: Abnormal   Collection Time: 11/19/15 4:00 AM  Result Value Ref Range   Sodium 146 (H) 135 - 145 mmol/L   Potassium 3.8 3.5 - 5.1 mmol/L   Chloride 110 101 - 111 mmol/L   CO2 24 22 - 32 mmol/L   Glucose, Bld 94 65 - 99 mg/dL   BUN 64 (H) 6 - 20 mg/dL   Creatinine, Ser 9.56 (H) 0.61 - 1.24 mg/dL   Calcium 9.0 8.9 - 21.3 mg/dL   Phosphorus 4.5 2.5 - 4.6 mg/dL   Albumin 2.7 (L) 3.5 - 5.0 g/dL   GFR calc non Af Amer 36 (L) >60  mL/min   GFR calc Af Amer 41 (L) >60 mL/min   Anion gap 12 5 - 15  Glucose, capillary Status: None   Collection Time: 11/19/15 7:44 AM  Result Value Ref Range   Glucose-Capillary 84 65 - 99 mg/dL   Comment 1 Notify RN       Imaging Results (Last 48 hours)    Dg Chest Port 1 View  11/18/2015 CLINICAL DATA: Respiratory failure. EXAM: PORTABLE CHEST 1 VIEW COMPARISON: 11/17/2015. FINDINGS: Tracheostomy tube, feeding tube, right PICC line, left subclavian line in stable position. Prior CABG. Stable cardiomegaly. Persistent left lower lobe infiltrate/edema again noted. Right base atelectasis and/or infiltrate/edema noted. Small left pleural effusion . These changes may be related to bibasilar pneumonia and/or congestive heart failure . The thorax. IMPRESSION: 1. Lines and tubes in stable position . 2. Prior CABG. Persistent cardiomegaly. 3. Persistent left lower lobe infiltrate and small left pleural effusion. Developing right lower lobe infiltrate. These changes may be related congestive heart failure with pulmonary edema. Bilateral pneumonia could also present in  this fashion. Electronically Signed By: Maisie Fus Register On: 11/18/2015 07:41   Dg Abd Portable 1v  11/18/2015 CLINICAL DATA: 55 year old male feeding tube placement. Initial encounter. EXAM: PORTABLE ABDOMEN - 1 VIEW COMPARISON: 11/16/2015. FINDINGS: Portable AP supine view at 1109 hours. A enteric tube placed, tip at the level of the distal second portion of the duodenum. Epicardial pacer wires remain in place. Increased gaseous distension of large bowel in the abdomen, but no dilated small bowel loops. Mild gaseous distension of the stomach. No definite pneumoperitoneum on this supine view. Stable visualized osseous structures. IMPRESSION: 1. Enteric tube placed, it tip at the mid duodenum. 2. Increased large bowel gas, favor ileus over mechanical obstruction. Electronically Signed By: Odessa Fleming M.D. On: 11/18/2015 12:01     Assessment/Plan: Diagnosis: debility, dysphagia due to CAD, respiratory failure. S/P CABG x 4 1. Does the need for close, 24 hr/day medical supervision in concert with the patient's rehab needs make it unreasonable for this patient to be served in a less intensive setting? Yes 2. Co-Morbidities requiring supervision/potential complications: htn, cad, dysphagia 3. Due to bladder management, bowel management, safety, skin/wound care, disease management, medication administration, pain management and patient education, does the patient require 24 hr/day rehab nursing? Yes 4. Does the patient require coordinated care of a physician, rehab nurse, PT (1-2 hrs/day, 5 days/week), OT (1-2 hrs/day, 5 days/week) and SLP (1-2 hrs/day, 5 days/week) to address physical and functional deficits in the context of the above medical diagnosis(es)? Yes Addressing deficits in the following areas: balance, endurance, locomotion, strength, transferring, bowel/bladder control, bathing, dressing, feeding, grooming, toileting, swallowing and psychosocial support 5. Can the patient actively  participate in an intensive therapy program of at least 3 hrs of therapy per day at least 5 days per week? Yes 6. The potential for patient to make measurable gains while on inpatient rehab is excellent 7. Anticipated functional outcomes upon discharge from inpatient rehab are modified independent with PT, modified independent with OT, modified independent with SLP. 8. Estimated rehab length of stay to reach the above functional goals is: 7 days 9. Does the patient have adequate social supports and living environment to accommodate these discharge functional goals? Yes 10. Anticipated D/C setting: Home 11. Anticipated post D/C treatments: HH therapy 12. Overall Rehab/Functional Prognosis: excellent  RECOMMENDATIONS: This patient's condition is appropriate for continued rehabilitative care in the following setting: CIR Patient has agreed to participate in recommended program. Potentially Note that  insurance prior authorization may be required for reimbursement for recommended care.  Comment: Will follow along for medical stability. Rehab Admissions Coordinator to follow up.  Thanks,  Christopher Oyster, MD, [facility]     11/19/2015       Revision History     Date/Time User Provider Type Action   11/19/2015 9:55 AM Christopher Oyster, MD Physician Sign   11/19/2015 9:36 AM Jacquelynn Cree, PA-C Physician Assistant Pend   View Details Report       Routing History     Date/Time From To Method   11/19/2015 9:55 AM Christopher Oyster, MD No Pcp Per Patient In Basket

## 2015-11-25 NOTE — Progress Notes (Signed)
Physical Therapy Session Note  Patient Details  Name: Christopher [name] MRN: [id number] Date of Birth: 1960/11/08  Today's Date: 11/25/2015 PT Individual Time: 1430-1457 PT Individual Time Calculation (min): 27 min   Short Term Goals: Week 1:  PT Short Term Goal 1 (Week 1): LTG=STG due to short LOS  Skilled Therapeutic Interventions/Progress Updates:  Pt received in recliner with wife present on RA with PMSV in place.  Pt reporting fatigue but willing to participate.  Performed sit > stand from recliner without UE support with supervision-min A.  Performed gait x 100' without AD in controlled environment with min A due to intermittent LOB.  During rest break discussed with pt and wife stair set up at home.  Reports after exiting the car pt has to descend 2 stairs in sidewalk with L rail and then has to ascend 8 + 8 steps with R rail with landing in between.  Pt performed stair negotiation training up/down 4 stairs x 2 reps with pt performing without UE support performing step to sequence with one seated rest break to assess vitals.  Performed LE strengthening and sit <> stand training with sit <> stand from regular height chair without UE support x 5 reps x 2 with ball between thighs for hip ADD/IR activation.   Returned to room ambulating with min A and pt left in recliner with all items within reach and wife providing supervision for thin liquids.    Therapy Documentation Precautions:  Precautions Precautions: Sternal, Fall Restrictions Weight Bearing Restrictions: No Other Position/Activity Restrictions: Sternal preacutions Vital Signs: Therapy Vitals Temp: 98.5 F (36.9 C) Temp Source: Oral Pulse Rate: 83 Resp: 18 BP: 111/69 mmHg Patient Position (if appropriate): Sitting Oxygen Therapy SpO2: 98 % O2 Device: Not Delivered Pulse Oximetry Type: Continuous Pain: Pain Assessment Pain Assessment: No/denies pain   See Function Navigator for Current Functional  Status.   Therapy/Group: Individual Therapy  Edman Circle [facility] 11/25/2015, 4:16 PM

## 2015-11-25 NOTE — Progress Notes (Signed)
Patient information reviewed and entered into eRehab system by Payton Prinsen, RN, CRRN, PPS Coordinator.  Information including medical coding and functional independence measure will be reviewed and updated through discharge.    

## 2015-11-25 NOTE — Progress Notes (Signed)
Patient ID: Christopher [name], male   DOB: [birth year], 55 y.o.   MRN: [id number]    SUBJECTIVE: Patient doing well today with start of rehab.  No dyspnea.  No chest pain.  Passed swallow study and NG tube is out.  [name] remains in place.   Scheduled Meds: . amiodarone  200 mg Oral BID  . antiseptic oral rinse  7 mL Mouth Rinse q12n4p  . aspirin  81 mg Oral Daily  . atorvastatin  80 mg Oral q1800  . chlorhexidine  15 mL Mouth Rinse BID  . enoxaparin (LOVENOX) injection  40 mg Subcutaneous Q24H  . feeding supplement (PRO-STAT SUGAR FREE 64)  30 mL Per Tube TID  . free water  200 mL Per Tube Q6H  . hydrALAZINE  25 mg Oral Q8H  . levalbuterol  1.25 mg Nebulization TID  . metoCLOPramide (REGLAN) injection  10 mg Intravenous QID  . pantoprazole sodium  40 mg Per Tube Daily  . potassium chloride  40 mEq Oral Daily  . QUEtiapine  50 mg Oral QHS  . sildenafil  40 mg Oral TID  . silver sulfADIAZINE   Topical BID  . sodium chloride flush  10-40 mL Intracatheter Q12H  . spironolactone  12.5 mg Oral Daily  . torsemide  80 mg Oral Daily  . triamcinolone cream   Topical BID   Continuous Infusions: . feeding supplement (JEVITY 1.2 CAL)     PRN Meds:.acetaminophen, alum & mag hydroxide-simeth, bisacodyl, diphenhydrAMINE, guaiFENesin-dextromethorphan, levalbuterol, ondansetron (ZOFRAN) IV, oxyCODONE-acetaminophen **AND** oxyCODONE, prochlorperazine **OR** prochlorperazine **OR** prochlorperazine, senna-docusate, sodium chloride, sodium chloride flush, sodium chloride flush, sodium phosphate, traMADol, traZODone    Filed Vitals:   11/25/15 0439 11/25/15 0521 11/25/15 0749 11/25/15 1133  BP: 110/64 110/64    Pulse: 81  80   Temp: 98.8 F (37.1 C)     TempSrc: Oral     Resp: Height:      Weight: 225 lb 5 oz (102.2 kg)     SpO2: 97%  96% 98%    Intake/Output Summary (Last 24 hours) at 11/25/15 1214 Last data filed at 11/25/15 0900  Gross per 24 hour  Intake      0 ml  Output     300 ml  Net   -300 ml    LABS: Basic Metabolic Panel:  Recent Labs  11/91/47 0410 11/24/15 0515 11/25/15 0442  NA 143 141 141  K 4.2 5.0 3.8  CL 105 103 103  CO2 GLUCOSE 101* 104* 121*  BUN 49* 48* 46*  CREATININE 1.62* 1.51* 1.56*  CALCIUM 8.6* 8.7* 8.5*  MG 2.0  --   --    Liver Function Tests:  Recent Labs  11/25/15 0442  AST 56*  ALT 62  ALKPHOS 68  BILITOT 0.7  PROT 5.9*  ALBUMIN 2.4*   No results for input(s): LIPASE, AMYLASE in the last 72 hours. CBC:  Recent Labs  11/24/15 0515 11/25/15 0442  WBC 8.6 6.4  NEUTROABS  --  4.5  HGB 10.9* 9.8*  HCT 36.0* 32.1*  MCV 90.0 89.9  PLT 226 187   Cardiac Enzymes: No results for input(s): CKTOTAL, CKMB, CKMBINDEX, TROPONINI in the last 72 hours. BNP: Invalid input(s): POCBNP D-Dimer: No results for input(s): DDIMER in the last 72 hours. Hemoglobin A1C: No results for input(s): HGBA1C in the last 72 hours. Fasting Lipid Panel: No results for input(s): CHOL, HDL, LDLCALC, TRIG, CHOLHDL, LDLDIRECT in the last  72 hours. Thyroid Function Tests: No results for input(s): TSH, T4TOTAL, T3FREE, THYROIDAB in the last 72 hours.  Invalid input(s): FREET3 Anemia Panel: No results for input(s): VITAMINB12, FOLATE, FERRITIN, TIBC, IRON, RETICCTPCT in the last 72 hours.   PHYSICAL EXAM General: NAD Neck: Thick, JVP difficult.  Tracheostomy.  Lungs: Decreased breath sounds left base.  CV: Nondisplaced PMI.  Heart regular S1/S2, no S3/S4, no murmur.  Legs wrapped, 1+ edema to knees.    Abdomen: Soft, nontender, no hepatosplenomegaly, no distention.  Neurologic: Alert and oriented x 3.  Psych: Normal affect. Extremities: No clubbing or cyanosis.    ASSESSMENT AND PLAN:  1. CAD: S/p late presentation inferior MI and CABG x 4. Continue statin, ASA. Coronary angiography 3/27 with multiple VT episodes showed patent LIMA-LAD and SVG-RCA. Patent SVG-ramus and SVG-D but slow flow in both grafts with  diffusely diseased small target vessels.  - Continue ASA 81 and statin.  2. Cardiogenic shock: Acute systolic CHF with RV failure post inferior MI with suspected RV involvement. EF 25-30% on echo (difficult images on 3/24 echo). He has significant RV failure from suspected RV infarct. 3/27 IABP placed to try to allow decrease in milrinone with frequent VT. 3/31 echo with EF 30-35%. IABP out 4/2. Milrinone off 4/7. He has some volume overload on exam.  - Continue torsemide 80 mg daily.  - Continue UNNA boots. - Can add spironolactone 12.5 mg daily.  - Continue Coreg 3.125 mg bid.  - Continue sildenafil 40 tid.  - Continue hydralazine 25 mg tid for afterload reduction (no Imdur with sildenafil use).  3. Complete heart block: Resolved.  4. VF arrest 3/24 early am and again at night 3/24. Ectopy quiescent. - Continue po amio - Will need Lifevest at discharge versus ICD => will discuss with EP, had VT 5-6 days after CABG.  5. AKI: Creatinine stable, follow carefully.  6. ID: Possible LLL PNA. He completed course of vancomycin/levofloxacin.  7. Pulmonary: S/P Trach 4/5 on trach collar. Has PM valve.  8. Dysphagia: Passed swallow study.  9. Atrial flutter: Now in NSR on amio. He was off AC due to trach bleeding. On DVT prophylaxis. Will not start coumadin unless AF/AFL recurs. 10. Deconditioning: continue PT.  [name] 11/25/2015 12:44 PM

## 2015-11-26 ENCOUNTER — Inpatient Hospital Stay (HOSPITAL_COMMUNITY): Payer: BLUE CROSS/BLUE SHIELD

## 2015-11-26 ENCOUNTER — Inpatient Hospital Stay (HOSPITAL_COMMUNITY): Payer: BLUE CROSS/BLUE SHIELD | Admitting: Occupational Therapy

## 2015-11-26 ENCOUNTER — Inpatient Hospital Stay (HOSPITAL_COMMUNITY): Payer: BLUE CROSS/BLUE SHIELD | Admitting: Speech Pathology

## 2015-11-26 DIAGNOSIS — J449 Chronic obstructive pulmonary disease, unspecified: Secondary | ICD-10-CM

## 2015-11-26 LAB — BASIC METABOLIC PANEL
ANION GAP: 13 (ref 5–15)
BUN: 35 mg/dL — ABNORMAL HIGH (ref 6–20)
CALCIUM: 8.7 mg/dL — AB (ref 8.9–10.3)
CHLORIDE: 97 mmol/L — AB (ref 101–111)
CO2: 29 mmol/L (ref 22–32)
CREATININE: 1.83 mg/dL — AB (ref 0.61–1.24)
GFR calc non Af Amer: 40 mL/min — ABNORMAL LOW (ref >60)
GFR, EST AFRICAN AMERICAN: 47 mL/min — AB (ref >60)
Glucose, Bld: 110 mg/dL — ABNORMAL HIGH (ref 65–99)
Potassium: 3.1 mmol/L — ABNORMAL LOW (ref 3.5–5.1)
SODIUM: 139 mmol/L (ref 135–145)

## 2015-11-26 LAB — CBC
HCT: 33.9 % — ABNORMAL LOW (ref 39.0–52.0)
HEMOGLOBIN: 10.4 g/dL — AB (ref 13.0–17.0)
MCH: 27.4 pg (ref 26.0–34.0)
MCHC: 30.7 g/dL (ref 30.0–36.0)
MCV: 89.4 fL (ref 78.0–100.0)
PLATELETS: 202 10*3/uL (ref 150–400)
RBC: 3.79 MIL/uL — AB (ref 4.22–5.81)
RDW: 17.3 % — ABNORMAL HIGH (ref 11.5–15.5)
WBC: 6.5 10*3/uL (ref 4.0–10.5)

## 2015-11-26 MED ORDER — TORSEMIDE 20 MG PO TABS
60.0000 mg | ORAL_TABLET | Freq: Every day | ORAL | Status: DC
  Administered 2015-11-27 – 2015-12-01 (×5): 60 mg via ORAL
  Filled 2015-11-26 (×5): qty 3

## 2015-11-26 NOTE — Procedures (Signed)
Tracheostomy tube change: Informed verbal consent was obtained after explaining the risks (including bleeding and infection), benefits and alternatives of the procedure. Verbal timeout was performed prior to the procedure. The old  #6 cuffed trach was carefully removed. the tracheostomy site appeared: healed w/ old dried bloody secretions which were removed. A new #4  Cuffless trach was easily placed in the tracheostomy stoma and secured with velcro trach ties. The tracheostomy was patent, good color change observed via EZ-CAP, and the patient was easily able to voice with finger occlusion and tolerated the procedure well with no immediate complications.   Simonne Martinet ACNP-BC [facility] Pulmonary/Critical Care Pager # [phone] OR # [phone] if no answer

## 2015-11-26 NOTE — Progress Notes (Signed)
PULMONARY / CRITICAL CARE MEDICINE   Name: Christopher [name] MRN: [id number] DOB: 10/31/1960    ADMISSION DATE:  11/24/2015 CONSULTATION DATE:  11/03/2015  REFERRING MD: Dr. Donata Clay / CVTS  CHIEF COMPLAINT:  Acute Hypoxic Respiratory Failure  HISTORY OF PRESENT ILLNESS:   [name] w/ ischemic cardiomyopathy following MI. Patient underwent CABG & CEA. Post-operative course complicated by arrhythmia and biventricular failure, HCAP, heart block. Improving  SUBJECTIVE: Feeling stronger   REVIEW OF SYSTEMS:  Feels well. Getting stronger  VITAL SIGNS: BP 109/66 mmHg  Pulse 77  Temp(Src) 98.5 F (36.9 C) (Oral)  Resp 20  Ht 5\' 10"  (1.778 m)  Wt 210 lb 12.2 oz (95.6 kg)  BMI 30.24 kg/m2  SpO2 97%  HEMODYNAMICS:    VENTILATOR SETTINGS: Vent Mode:  [-]  FiO2 (%):  [28 %] 28 %  INTAKE / OUTPUT: I/O last 3 completed shifts: In: 0  Out: 1800 [Urine:1800]  PHYSICAL EXAMINATION: General:  Comfortable. Walking around room.  Neuro:  Awake, oriented. No focal def  HEENT: Trach in place.; now w/ #4 cuffless w/ occlusive cap. Has excellent phonation and able to cough sputum from mouth. Some bloody secretions after the removal of cuffed trach Cardiovascular:  Regular rate. No MRG Lungs:  Clear, no wheezing, crackles Abdomen:  Soft. Protuberant. Normal BS. Nontender. Integument:  No rash  LABS:  BMET  Recent Labs Lab 11/24/15 0515 11/25/15 0442 11/26/15 0553  NA 141 141 139  K 5.0 3.8 3.1*  CL 103 103 97*  CO2 28 30 29   BUN 48* 46* 35*  CREATININE 1.51* 1.56* 1.83*  GLUCOSE 104* 121* 110*   Electrolytes  CBC  Recent Labs Lab 11/24/15 0515 11/25/15 0442 11/26/15 0553  WBC 8.6 6.4 6.5  HGB 10.9* 9.8* 10.4*  HCT 36.0* 32.1* 33.9*  PLT 226 187 202      MICROBIOLOGY: Tracheal Asp Ctx 3/27:  Oral Flora Urine Ctx 3/24:  Negative MRSA PCR 3/20:  Negative MRSA PCR 3/14:  Negative C diff 4/10 > Negative  ANTIBIOTICS: Zosyn 3/29 - 4/1 Vancomcyin 3/21  - 3/22; 3/29 - 4/7 Cefuroxime 3/21 - 3/23 (periop prophylaxis) Elita Quick 3/27 - 3/29 Levaquin 4/1 - 4/7 Flagyl 4/9 >> 4/11  SIGNIFICANT EVENTS: 3/14 - Admit 3/14 - LHC 3/21 - CABG 3/27 - IABP replaced >> 4/1 3/28 - Chest Tubes removed. RHC & LHC.  LINES/TUBES: Trach 6.0 (JY) 4/5 >> downsized 4 cuffless 4/20 OETT 7.5 Extubated 3/22; 3.24 - 4/5  ASSESSMENT / PLAN:  Tracheostomy dependence s/p prolonged vent dependence after CABG, CEA; further complicated by decompensated systolic heart failure, cardiogenic shock & MODS: now making excellent progress in rehab tolerating PMV & diet.  We have down-sized him to #4 cuffless Plan Keep trach capped x 24 hrs If no issues over night we will decannulate 4/21  Simonne Martinet ACNP-BC [facility] Pulmonary/Critical Care Pager # [phone] OR # [phone] if no answer      11/26/2015, 10:15 AM

## 2015-11-26 NOTE — Progress Notes (Signed)
Physical Therapy Session Note  Patient Details  Name: Christopher [name] MRN: [id number] Date of Birth: 10/08/1960  Today's Date: 11/26/2015 PT Individual Time: 0800-0900 PT Individual Time Calculation (min): 60 min   Short Term Goals: Week 1:  PT Short Term Goal 1 (Week 1): LTG=STG due to short LOS  Skilled Therapeutic Interventions/Progress Updates:   pt finishing breakfast with pt following swallowing precautions without cues; intermittent coughing noted. Discussed overall goals and feedback from therapy sessions yesterday.   Session focused on functional gait without AD at overall supervision level, endurance and activity tolerance, stair negotiation training for home entry practice, and neuro re-ed for balance training on Biodex (see below). Pt demonstrating improved endurance and decreased assist with stairs today - performing 8 at a time x 2 trials with rest break in between.    Random control: (BIODEX) for neuro-red Trial 1: slow circle speed, biggest circle; static;49% (1 min 30 sec) Trial 2: slow circle speed, biggest circle; compliant level 10; 89% (1 min 30 sec) Trial 3: slow circle speed, biggest circle; compliant level 8; 98% (1 min 30 sec) Trial 4: slow circle speed, smallest circle; compliant level 8; 88% (1 min 30 sec)   Issued [place] Level B HEP to address balance and strength and reviewed this with patient and wife verbally. Pt did not physically do exercises due to fatigue, but plan to do in future session. Also discussed plan to go outside tomorrow for community mobility and balance training which pt was very excited about.    Therapy Documentation Precautions:  Precautions Precautions: Sternal, Fall Restrictions Weight Bearing Restrictions: No Other Position/Activity Restrictions: Sternal preacutions Vital Signs: O2 remained > 93% on room air during session and HR = 83-92 bpm with activity. Pain: No complaints of pain. Reports back discomfort from sitting in  recliner.    See Function Navigator for Current Functional Status.   Therapy/Group: Individual Therapy  Karolee Stamps Darrol Poke, PT, DPT  11/26/2015, 10:23 AM

## 2015-11-26 NOTE — Progress Notes (Signed)
[facility] PHYSICAL MEDICINE & REHABILITATION     PROGRESS NOTE    Subjective/Complaints: Had a much better night. Pleased to be on a diet. Clot around trach---RT deferred to PCCM to remove. No coughing. Breathing without issues. Mild pain in right foot  ROS: Pt denies fever, rash/itching, headache, blurred or double vision, nausea, vomiting, abdominal pain, diarrhea,   shortness of breath, palpitations, dysuria, dizziness, neck or back pain, bleeding, anxiety, or depression   Objective: Vital Signs: Blood pressure 109/66, pulse 77, temperature 98.5 F (36.9 C), temperature source Oral, resp. rate 20, height 5\' 10"  (1.778 m), weight 95.6 kg (210 lb 12.2 oz), SpO2 97 %. Dg Abd 1 View  11/24/2015  CLINICAL DATA:  Feeding tube placement.  History of ileus. EXAM: ABDOMEN - 1 VIEW COMPARISON:  Single-view of the abdomen 11/19/2015 and 11/18/2015. FINDINGS: Feeding tube is in place with the tip in the third portion of the duodenum. Gaseous distention of small and large bowel persists and appears mildly improved. IMPRESSION: Feeding tube tip is in the third portion of the duodenum. Mild improvement in gaseous distention of small and large bowel most compatible with ileus. Electronically Signed   By: Drusilla Kanner M.D.   On: 11/24/2015 18:54   Dg Swallowing Func-speech Pathology  11/25/2015  Objective Swallowing Evaluation: Type of Study: MBS-Modified Barium Swallow Study Patient Details Name: GRACE HAGGART MRN: [id number] Date of Birth: 1960/12/05 Today's Date: 11/25/2015 Time: SLP Start Time (ACUTE ONLY): 0840-SLP Stop Time (ACUTE ONLY): 0910 SLP Time Calculation (min) (ACUTE ONLY): 30 min Past Medical History: Past Medical History Diagnosis Date . Hypertension  . TIA (transient ischemic attack)  . Stenosis of right carotid artery  . Complete heart block (HCC) 10/20/2015 Past Surgical History: Past Surgical History Procedure Laterality Date . Arm surgery Right 2003 . Eye surgery   . Laceration  repair   . Cardiac catheterization N/A 10/20/2015   Procedure: Left Heart Cath and Coronary Angiography;  Surgeon: Tonny Bollman, MD;  Location: [facility] INVASIVE CV LAB;  Service: Cardiovascular;  Laterality: N/A; . Coronary artery bypass graft N/A 10/27/2015   Procedure: CORONARY ARTERY BYPASS GRAFTING (CABG) x four, using left internal mammary artery and rt leg greater saphenous vein harvested endoscopically;  Surgeon: Kerin Perna, MD;  Location: [facility] OR;  Service: Open Heart Surgery;  Laterality: N/A; . Tee without cardioversion N/A 10/27/2015   Procedure: TRANSESOPHAGEAL ECHOCARDIOGRAM (TEE);  Surgeon: Kerin Perna, MD;  Location: [facility] OR;  Service: Open Heart Surgery;  Laterality: N/A; . Endarterectomy Right 10/27/2015   Procedure: RIGHT ENDARTERECTOMY CAROTID with patch angioplasty using Xenosure bovine pericardium patch;  Surgeon: Fransisco Hertz, MD;  Location: [facility] OR;  Service: Vascular;  Laterality: Right; . Cardiac catheterization N/A 11/02/2015   Procedure: Right/Left Heart Cath and Coronary/Graft Angiography;  Surgeon: Laurey Morale, MD;  [facility] INVASIVE CV LAB;  Service: Cardiovascular;  Laterality: N/A; HPI: 55 y.o. male w/ ischemic cardiomyopathy following MI s/p CABG & CEA. Post-operative course complicated by arrhythmia and biventricular failure, HCAP, heart block. PMHx HTN, stenosis of R carotid artery, TIA. Intubated 3/21-3/22, re-intubated 3/26 and failed to wean, trach placed 4/6. Subjective: pt alert, anxious for PO Assessment / Plan / Recommendation CHL IP CLINICAL IMPRESSIONS 11/25/2015 Therapy Diagnosis Moderate pharyngeal phase dysphagia Clinical Impression MBSS completed for objective measure of swallow function. Pt able to wear PMSV throughout assessment. Swallow function is characterized by mild pharyngeal delay of initiation resulting in consistent flash penetration with thins (silent aspiration noted with chin  tuck). General pharyngeal weakness noted and resulted in vallecular and  pyriform residue with all consistencies. Hard double swallows with solids were effective to clear bolus. Poor epiglottic inversion noted which impacted pt's ability to manage meds- whole tablet lodged in the valleculae (without pt sensation) and required puree wash to clear. Recommend: Initiate Dys 2 diet with thin liquids. OOB for all meals, SMALL sips/bites, meds crushed in puree, hard double swallow for all solids and intermittent throat clear/swallow. Full supervision to insure compliance with swallow precautions. Pt remains at risk for aspiration given general debility and pharyngeal limitations, however he has demonstrated adequate improvement to proceed with initiation of PO diet with strict adherance to swallow precautions. Discussed with pt and spouse- both parties verbalized understanding.  Impact on safety and function Mild aspiration risk   CHL IP TREATMENT RECOMMENDATION 11/21/2015 Treatment Recommendations Therapy as outlined in treatment plan below   Prognosis 11/25/2015 Prognosis for Safe Diet Advancement Good Barriers to Reach Goals -- Barriers/Prognosis Comment -- CHL IP DIET RECOMMENDATION 11/25/2015 SLP Diet Recommendations Dysphagia 2 (Fine chop) solids;Thin liquid Liquid Administration via Cup;Straw Medication Administration Crushed with puree Compensations Minimize environmental distractions;Small sips/bites;Clear throat intermittently;Effortful swallow Postural Changes Seated upright at 90 degrees   CHL IP OTHER RECOMMENDATIONS 11/25/2015 Recommended Consults -- Oral Care Recommendations Oral care BID Other Recommendations Place PMSV during PO intake   CHL IP FOLLOW UP RECOMMENDATIONS 11/24/2015 Follow up Recommendations Inpatient Rehab   CHL IP FREQUENCY AND DURATION 11/21/2015 Speech Therapy Frequency (ACUTE ONLY) min 2x/week Treatment Duration 2 weeks      CHL IP ORAL PHASE 11/25/2015 Oral Phase WFL Oral - Pudding Teaspoon -- Oral - Pudding Cup -- Oral - Honey Teaspoon -- Oral - Honey Cup -- Oral  - Nectar Teaspoon -- Oral - Nectar Cup -- Oral - Nectar Straw -- Oral - Thin Teaspoon -- Oral - Thin Cup -- Oral - Thin Straw -- Oral - Puree -- Oral - Mech Soft -- Oral - Regular -- Oral - Multi-Consistency -- Oral - Pill -- Oral Phase - Comment --  CHL IP PHARYNGEAL PHASE 11/25/2015 Pharyngeal Phase Impaired Pharyngeal- Pudding Teaspoon -- Pharyngeal -- Pharyngeal- Pudding Cup -- Pharyngeal -- Pharyngeal- Honey Teaspoon -- Pharyngeal -- Pharyngeal- Honey Cup -- Pharyngeal -- Pharyngeal- Nectar Teaspoon -- Pharyngeal -- Pharyngeal- Nectar Cup -- Pharyngeal -- Pharyngeal- Nectar Straw -- Pharyngeal -- Pharyngeal- Thin Teaspoon Delayed swallow initiation-vallecula;Reduced epiglottic inversion Pharyngeal -- Pharyngeal- Thin Cup Delayed swallow initiation-pyriform sinuses;Reduced epiglottic inversion;Penetration/Aspiration before swallow;Pharyngeal residue - valleculae;Pharyngeal residue - pyriform Pharyngeal (No Data) Pharyngeal- Thin Straw Delayed swallow initiation-pyriform sinuses;Reduced epiglottic inversion;Reduced airway/laryngeal closure;Penetration/Aspiration before swallow Pharyngeal Material enters airway, remains ABOVE vocal cords then ejected out Pharyngeal- Puree Pharyngeal residue - valleculae;Reduced epiglottic inversion;Pharyngeal residue - pyriform Pharyngeal -- Pharyngeal- Mechanical Soft -- Pharyngeal -- Pharyngeal- Regular Pharyngeal residue - valleculae;Pharyngeal residue - pyriform;Reduced epiglottic inversion Pharyngeal -- Pharyngeal- Multi-consistency -- Pharyngeal -- Pharyngeal- Pill Pharyngeal residue - valleculae;Reduced epiglottic inversion Pharyngeal -- Pharyngeal Comment Characterized by dlay and decreased epiglottic inversion  CHL IP CERVICAL ESOPHAGEAL PHASE 11/21/2015 Cervical Esophageal Phase WFL Pudding Teaspoon -- Pudding Cup -- Honey Teaspoon -- Honey Cup -- Nectar Teaspoon -- Nectar Cup -- Nectar Straw -- Thin Teaspoon -- Thin Cup -- Thin Straw -- Puree -- Mechanical Soft -- Regular  -- Multi-consistency -- Pill -- Cervical Esophageal Comment -- No flowsheet data found. Rocky Crafts  MA, CCC-SLP 11/25/2015, 1:00 PM               Recent  Labs  11/25/15 0442 11/26/15 0553  WBC 6.4 6.5  HGB 9.8* 10.4*  HCT 32.1* 33.9*  PLT 187 202    Recent Labs  11/25/15 0442 11/26/15 0553  NA 141 139  K 3.8 3.1*  CL 103 97*  GLUCOSE 121* 110*  BUN 46* 35*  CREATININE 1.56* 1.83*  CALCIUM 8.5* 8.7*   CBG (last 3)   Recent Labs  11/24/15 0309 11/24/15 0725 11/24/15 1151  GLUCAP 98 90 101*    Wt Readings from Last 3 Encounters:  11/26/15 95.6 kg (210 lb 12.2 oz)  11/24/15 103.7 kg (228 lb 9.9 oz)  10/20/15 116.121 kg (256 lb)    Physical Exam:  Constitutional: He is oriented to person, place, and time. He appears well-developed and well-nourished.  Trach with PMSV and strong voice noted.  HENT:  Head: Normocephalic and atraumatic.  Mouth/Throat: Oropharynx is clear and moist.  Eyes: Conjunctivae are normal. Pupils are equal, round, and reactive to light.  Neck: Neck supple.  #6 cuffed trach in place--old blood around trach. strong voice with PMV, no cough Cardiovascular: Normal rate and regular rhythm. Exam reveals distant heart sounds.  Respiratory: Effort normal. No respiratory distress. He has decreased breath sounds in the left middle field and the left lower field. He has no wheezes. He has rhonchi.  GI: He exhibits distension. Bowel sounds are increased. There is no tenderness. There is no rebound.  Protuberant.  Musculoskeletal: He exhibits edema.  BLE with edema tibially and 2+ pedally with unna boots in place.  Neurological: He is alert and oriented to person, place, and time.  Wet voice but speech clear. Follows commands without difficulty. UE 3-4/5 prox to distal. LE: 3/5 HF, 3+ KE and 4/5 ADF/PF. No gross sensory deficits. Cognitively appropriate with reasonable insight and awareness.  Skin: Skin is warm and dry.  Sternal incision clean  and dry with scabs. Rash on left buttocks--drug eruption per wife. Irritated area intergluteal cleft. Right leg donor site with staples and intact  Psychiatric: He has a normal mood and affect. His behavior is irritable, impulsive   Assessment/Plan: 1. Weakness, dysphagia, and functional deficits secondary to multiple medical issues which require 3+ hours per day of interdisciplinary therapy in a comprehensive inpatient rehab setting. Physiatrist is providing close team supervision and 24 hour management of active medical problems listed below. Physiatrist and rehab team continue to assess barriers to discharge/monitor patient progress toward functional and medical goals.  Function:  Bathing Bathing position   Position: Wheelchair/chair at sink  Bathing parts Body parts bathed by patient: Right arm, Left arm, Chest, Abdomen, Front perineal area, Buttocks, Right upper leg, Left upper leg Body parts bathed by helper: Back  Bathing assist Assist Level: Touching or steadying assistance(Pt > 75%)      Upper Body Dressing/Undressing Upper body dressing   What is the patient wearing?: Pull over shirt/dress     Pull over shirt/dress - Perfomed by patient: Thread/unthread right sleeve, Pull shirt over trunk, Thread/unthread left sleeve, Put head through opening          Upper body assist Assist Level: Set up   Set up : To obtain clothing/put away  Lower Body Dressing/Undressing Lower body dressing                                  Lower body assist        Toileting Toileting   Toileting steps  completed by patient: Adjust clothing prior to toileting, Performs perineal hygiene, Adjust clothing after toileting   Toileting Assistive Devices: Grab bar or rail  Toileting assist Assist level: Touching or steadying assistance (Pt.75%)   Transfers Chair/bed transfer   Chair/bed transfer method: Stand pivot Chair/bed transfer assist level: Supervision or verbal  cues Chair/bed transfer assistive device: Armrests     Locomotion Ambulation     Max distance: 277ft Assist level: Touching or steadying assistance (Pt > 75%)   Wheelchair Wheelchair activity did not occur: N/A        Cognition Comprehension Comprehension assist level: Understands basic 90% of the time/cues < 10% of the time  Expression Expression assist level: Expresses basic 90% of the time/requires cueing < 10% of the time.  Social Interaction Social Interaction assist level: Interacts appropriately 90% of the time - Needs monitoring or encouragement for participation or interaction.  Problem Solving Problem solving assist level: Solves basic 90% of the time/requires cueing < 10% of the time  Memory Memory assist level: Recognizes or recalls 90% of the time/requires cueing < 10% of the time   Medical Problem List and Plan: 1. Weakness and functional deficits secondary to Debility.   -continue therapies 2. DVT Prophylaxis/Anticoagulation: Pharmaceutical: Lovenox indicated 3. Pain Management: tylenol prn/percocet 4. Mood: LCSW to follow for evaluation and support.  5. Neuropsych: This patient is capable of making decisions on his own behalf. 6. Skin/Wound Care: Monitor wound for healing.  7. Fluids/Electrolytes/Nutrition: encourage po. 8. CAD s/p CABG: Continue sternal precautions. Monitor for symptoms with increased activity. On ASA and statin.  9. HTN: Monitor BP bid.  10. Acute combined systolic and diastolic CHF: Daily weights. Demadex to be increased in am. On hydralazine, demadex, sildenafil and statin.   -change unna dressings 11. Acute on chronic respiratory failure/OSA/OHS: continue ATC with cuffed #6 trach per PCCM.   -voice and coughing/secretions MUCH improved---ready for #4 trach  -Continue xopenex every 6 hours.  12. Shocked liver: follow up labs reviewed and all wnl 13. Adynamic ileus: Continue reglan---qid schedule  -tolerating po diet so  far 14. ABLA: Stable.  15. Complete HB: resolved. Had V Fib arrest--may need ICD v/s Life Vest at discharge? 16. A flutter: In NSR on amiodarone--no anticoagulation unless A fib/A flutter recurs.  17. Acute renal failure: Likely due to diuresis. Continue water flushes.   -kdur daily, K+ level normal  -BUN/Cr stable 18. Dysphagia:  -on D2 diet currently without any issues    LOS (Days) 2 A FACE TO FACE EVALUATION WAS PERFORMED  SWARTZ,ZACHARY T 11/26/2015 8:45 AM

## 2015-11-26 NOTE — Progress Notes (Signed)
Orthopedic Tech Progress Note Patient Details:  Christopher [name] 1961/01/25 [id number]  Ortho Devices Type of Ortho Device: Roland Rack boot Ortho Device/Splint Location: bilateral Ortho Device/Splint Interventions: Application   Nikki Dom 11/26/2015, 4:17 PM

## 2015-11-26 NOTE — Progress Notes (Signed)
Speech Language Pathology Cognitive-Linguistic Evaluation & Daily Session Note  Patient Details  Name: Christopher [name] MRN: [id number] Date of Birth: 01/31/1961  Today's Date: 11/26/2015 SLP Individual Time: 1045-1200 SLP Individual Time Calculation (min): 75 min  Short Term Goals: Week 1: SLP Short Term Goal 1 (Week 1): Patient will consume current diet with minimal overt s/s of aspiration with Mod I for use of swallowing compensatory strategies. SLP Short Term Goal 2 (Week 1): Patient will perform pharyngeal strengthening exercises with Mod I.  SLP Short Term Goal 3 (Week 1): Patient will wear the PMSV during all waking hours with vitals remaining WFL and 100% intelligibility at the conversation level with Mod I.  SLP Short Term Goal 4 (Week 1): Patient will independently donn/doff PMSV SLP Short Term Goal 5 (Week 1): Patient will participate in the MoCA to assess cognitive function.   Skilled Therapeutic Interventions: Patient was administered a cognitive linguistic evaluation. Patient was administered a MoCA and scored 27/30 points due to impairments in short-term recall, however, memory impairments have not been noted by clinician or gamily during functional tasks or with functional information. Both the patient and his wife report he is at his baseline level of cognitive functioning, therefore, skilled SLP intervention is not warranted for cognition.    Patient consumed his lunch meal of Dys. 2 textures with thin liquids without overt s/s of aspiration and required intermittent verbal cues for use of swallowing compensatory strategies. Patient's wife present and educated in regards to patient's current swallowing function, diet recommendations, appropriate textures, swallowing compensatory strategies and appropriate cueing. Therefore, patient's wife signed off to provide supervision with meals. Patient left upright in recliner with all needs within reach. Continue with current plan of care.     Function:  Eating Eating   Modified Consistency Diet: Yes Eating Assist Level: Set up assist for;Supervision or verbal cues   Eating Set Up Assist For: Opening containers       Cognition Comprehension Comprehension assist level: Follows complex conversation/direction with extra time/assistive device  Expression   Expression assist level: Expresses basic needs/ideas: With extra time/assistive device  Social Interaction Social Interaction assist level: Interacts appropriately with others with medication or extra time (anti-anxiety, antidepressant).  Problem Solving Problem solving assist level: Solves basic problems with no assist  Memory Memory assist level: More than reasonable amount of time    Pain No/Denies Pain   Therapy/Group: Individual Therapy  [name] 11/26/2015, 3:43 PM

## 2015-11-26 NOTE — Progress Notes (Signed)
Occupational Therapy Session Note  Patient Details  Name: TIVON MOXEY MRN: [id number] Date of Birth: [birth year]  Today's Date: 11/26/2015 OT Individual Time: 1300-1400 OT Individual Time Calculation (min): 60 min    Short Term Goals: Week 1:  OT Short Term Goal 1 (Week 1): STG=LTG due to LOS  Skilled Therapeutic Interventions/Progress Updates:    Pt seen for OT ADL bathing/dressing session. Pt sitting up in recliner upon arrival with wife present, agreeable to tx session and voicing desire to complete showering task. He ambulated throughout room with close supervision- min A. He bathed seated on 3-1 BSC, standing to complete periarea hygiene. Pt's wife assisted with bathing task. He dressed seated in w/c at sink with min cuing provided for safety awareness. He completed grooming task at sink, alternating sitting/ standing in order to increase functional activity tolerance. Pt returned to recliner at end of session, left with all needs in reach. Pt and wife educated throughout session regarding OT goals, importance of independence/ participation with ADLs/IADLs, and d/c planning.  Pt provided with medium grade theraputty as he feels his grip strength is diminished, however MMT score 5/5. Will provide handout of exercises at next tx session.   Therapy Documentation Precautions:  Precautions Precautions: Sternal, Fall Restrictions Weight Bearing Restrictions: No Other Position/Activity Restrictions: Sternal preacutions Pain:   No/ denies pain  See Function Navigator for Current Functional Status.   Therapy/Group: Individual Therapy  Lewis, Shiasia Porro C 11/26/2015, 7:00 AM

## 2015-11-26 NOTE — Progress Notes (Signed)
Patient ID: Christopher [name], male   DOB: 07/20/1961, 55 y.o.   MRN: [id number]    SUBJECTIVE:  Had trach down sized.   Wants to go home. Denies SOB/Dizziness. No CP.   Scheduled Meds: . amiodarone  200 mg Oral BID  . antiseptic oral rinse  7 mL Mouth Rinse q12n4p  . aspirin  81 mg Oral Daily  . atorvastatin  80 mg Oral q1800  . carvedilol  3.125 mg Oral BID WC  . chlorhexidine  15 mL Mouth Rinse BID  . enoxaparin (LOVENOX) injection  40 mg Subcutaneous Q24H  . feeding supplement (ENSURE ENLIVE)  237 mL Oral BID BM  . feeding supplement (PRO-STAT SUGAR FREE 64)  30 mL Oral Q1500  . hydrALAZINE  25 mg Oral Q8H  . levalbuterol  1.25 mg Nebulization TID  . metoCLOPramide  10 mg Oral TID AC & HS  . pantoprazole sodium  40 mg Per Tube Daily  . potassium chloride  40 mEq Oral Daily  . QUEtiapine  50 mg Oral QHS  . sildenafil  40 mg Oral TID  . silver sulfADIAZINE   Topical BID  . sodium chloride flush  10-40 mL Intracatheter Q12H  . spironolactone  12.5 mg Oral Daily  . torsemide  80 mg Oral Daily  . triamcinolone cream   Topical BID   Continuous Infusions:   PRN Meds:.acetaminophen, alum & mag hydroxide-simeth, bisacodyl, diphenhydrAMINE, guaiFENesin-dextromethorphan, levalbuterol, ondansetron (ZOFRAN) IV, oxyCODONE-acetaminophen **AND** oxyCODONE, prochlorperazine **OR** prochlorperazine **OR** prochlorperazine, senna-docusate, sodium chloride, sodium chloride flush, sodium chloride flush, sodium phosphate, traMADol, traZODone    Filed Vitals:   11/26/15 0502 11/26/15 0740 11/26/15 1039 11/26/15 1100  BP: 109/66     Pulse: 82 77  73  Temp: 98.5 F (36.9 C)     TempSrc: Oral     Resp: Height:      Weight:      SpO2: 95% 97% 96% 100%    Intake/Output Summary (Last 24 hours) at 11/26/15 1219 Last data filed at 11/26/15 0825  Gross per 24 hour  Intake    120 ml  Output   1500 ml  Net  -1380 ml    LABS: Basic Metabolic Panel:  Recent Labs   11/25/15 0442 11/26/15 0553  NA 141 139  K 3.8 3.1*  CL 103 97*  CO2 30 29  GLUCOSE 121* 110*  BUN 46* 35*  CREATININE 1.56* 1.83*  CALCIUM 8.5* 8.7*   Liver Function Tests:  Recent Labs  11/25/15 0442  AST 56*  ALT 62  ALKPHOS 68  BILITOT 0.7  PROT 5.9*  ALBUMIN 2.4*   No results for input(s): LIPASE, AMYLASE in the last 72 hours. CBC:  Recent Labs  11/25/15 0442 11/26/15 0553  WBC 6.4 6.5  NEUTROABS 4.5  --   HGB 9.8* 10.4*  HCT 32.1* 33.9*  MCV 89.9 89.4  PLT 187 202   Cardiac Enzymes: No results for input(s): CKTOTAL, CKMB, CKMBINDEX, TROPONINI in the last 72 hours. BNP: Invalid input(s): POCBNP D-Dimer: No results for input(s): DDIMER in the last 72 hours. Hemoglobin A1C: No results for input(s): HGBA1C in the last 72 hours. Fasting Lipid Panel: No results for input(s): CHOL, HDL, LDLCALC, TRIG, CHOLHDL, LDLDIRECT in the last 72 hours. Thyroid Function Tests: No results for input(s): TSH, T4TOTAL, T3FREE, THYROIDAB in the last 72 hours.  Invalid input(s): FREET3 Anemia Panel: No results for input(s): VITAMINB12, FOLATE, FERRITIN, TIBC, IRON, RETICCTPCT in the last 72 hours.  PHYSICAL EXAM General: NAD. Sitting in the chair.  Neck: Thick, JVP difficult to assess. Tracheostomy.  Lungs: Decreased breath sounds left base.  CV: Nondisplaced PMI.  Heart regular S1/S2, no S3/S4, no murmur.  Legs wrapped. 1+ edema above wraps.     Abdomen: Soft, nontender, no hepatosplenomegaly, no distention.  Neurologic: Alert and oriented x 3.  Psych: Normal affect. Extremities: No clubbing or cyanosis.    ASSESSMENT AND PLAN:  1. CAD: S/p late presentation inferior MI and CABG x 4. Continue statin, ASA. Coronary angiography 3/27 with multiple VT episodes showed patent LIMA-LAD and SVG-RCA. Patent SVG-ramus and SVG-D but slow flow in both grafts with diffusely diseased small target vessels.  - Continue ASA 81 and statin.  2. Cardiogenic shock: Acute systolic  CHF with RV failure post inferior MI with suspected RV involvement. EF 25-30% on echo (difficult images on 3/24 echo). He has significant RV failure from suspected RV infarct. 3/27 IABP placed to try to allow decrease in milrinone with frequent VT. 3/31 echo with EF 30-35%. IABP out 4/2. Milrinone off 4/7. Volume status improved. Renal function stable.  - Continue torsemide 80 mg daily.  - Continue UNNA boots. - Continue spironolactone 12.5 mg daily.  - Continue Coreg 3.125 mg bid.  - Continue sildenafil 40 tid.  - Continue hydralazine 25 mg tid for afterload reduction (no Imdur with sildenafil use).  3. Complete heart block: Resolved.  4. VF arrest 3/24 early am and again at night 3/24.  - Continue po amio - Will order Life Vest. Plan to repeat ECHO in a couple of months for ICD evaluation.  5. AKI: Creatinine stable, follow carefully.  6. ID: Possible LLL PNA. He completed course of vancomycin/levofloxacin.  7. Pulmonary: S/P Trach 4/5 on trach collar. Has PM valve. Had trach down sized with plans to decannulate before discharge.  8. Dysphagia: Passed swallow study.  9. Atrial flutter:  Regular pulse. He was off AC due to trach bleeding. On DVT prophylaxis. Will not start coumadin unless AF/AFL recurs. 10. Deconditioning: continue PT.  Discussed daily weights at home, low salt food choices, and limiting fluid intake to < 2 liters per day.    Amy Clegg NP-C  11/26/2015 12:19 PM  Patient seen with NP, agree with the above note.  Doing well with therapy.  I will decrease torsemide to 60 mg daily and add KCl.  Will need Lifevest, arrange today.   [name] 11/26/2015 1:48 PM

## 2015-11-26 NOTE — Progress Notes (Signed)
[facility] Individual Statement of Services  Patient Name:  Christopher [name]  Date:  11/26/2015  Welcome to the [facility].  Our goal is to provide you with an individualized program based on your diagnosis and situation, designed to meet your specific needs.  With this comprehensive rehabilitation program, you will be expected to participate in at least 3 hours of rehabilitation therapies Monday-Friday, with modified therapy programming on the weekends.  Your rehabilitation program will include the following services:  Physical Therapy (PT), Occupational Therapy (OT), Speech Therapy (ST), 24 hour per day rehabilitation nursing, Case Management (Social Worker), Rehabilitation Medicine, Nutrition Services and Pharmacy Services  Weekly team conferences will be held on Tuesdays to discuss your progress.  Your Social Worker will talk with you frequently to get your input and to update you on team discussions.  Team conferences with you and your family in attendance may also be held.  Expected length of stay: 7 to 10 days  Overall anticipated outcome:  Modified Independent with supervision needed for stairs  Depending on your progress and recovery, your program may change. Your Social Worker will coordinate services and will keep you informed of any changes. Your Social Worker's name and contact numbers are listed  below.  The following services may also be recommended but are not provided by the [facility]:   Driving Evaluations  Home Health Rehabiltiation Services  Outpatient Rehabilitation Services  Vocational Rehabilitation   Arrangements will be made to provide these services after discharge if needed.  Arrangements include referral to agencies that provide these services.  Your insurance has been verified to be:  [employer] Your primary doctor is:  Boneta Lucks will help you find a new PCP  Pertinent information will  be shared with your doctor and your insurance company.  Social Worker:  Staci Acosta, LCSW  [phone] or [phone]  Information discussed with and copy given to patient by: Elvera Lennox, 11/26/2015, 1:29 PM

## 2015-11-27 ENCOUNTER — Inpatient Hospital Stay (HOSPITAL_COMMUNITY): Payer: BLUE CROSS/BLUE SHIELD

## 2015-11-27 ENCOUNTER — Telehealth (HOSPITAL_COMMUNITY): Payer: Self-pay | Admitting: Pharmacist

## 2015-11-27 ENCOUNTER — Inpatient Hospital Stay (HOSPITAL_COMMUNITY): Payer: BLUE CROSS/BLUE SHIELD | Admitting: Occupational Therapy

## 2015-11-27 ENCOUNTER — Inpatient Hospital Stay (HOSPITAL_COMMUNITY): Payer: BLUE CROSS/BLUE SHIELD | Admitting: Speech Pathology

## 2015-11-27 LAB — BASIC METABOLIC PANEL
ANION GAP: 12 (ref 5–15)
BUN: 30 mg/dL — AB (ref 6–20)
CHLORIDE: 96 mmol/L — AB (ref 101–111)
CO2: 28 mmol/L (ref 22–32)
Calcium: 8.8 mg/dL — ABNORMAL LOW (ref 8.9–10.3)
Creatinine, Ser: 1.76 mg/dL — ABNORMAL HIGH (ref 0.61–1.24)
GFR, EST AFRICAN AMERICAN: 49 mL/min — AB (ref >60)
GFR, EST NON AFRICAN AMERICAN: 42 mL/min — AB (ref >60)
Glucose, Bld: 104 mg/dL — ABNORMAL HIGH (ref 65–99)
POTASSIUM: 3.8 mmol/L (ref 3.5–5.1)
SODIUM: 136 mmol/L (ref 135–145)

## 2015-11-27 MED ORDER — ENSURE ENLIVE PO LIQD
237.0000 mL | Freq: Every day | ORAL | Status: DC
  Administered 2015-11-27 – 2015-11-30 (×2): 237 mL via ORAL

## 2015-11-27 MED ORDER — LEVALBUTEROL HCL 1.25 MG/0.5ML IN NEBU
1.2500 mg | INHALATION_SOLUTION | Freq: Two times a day (BID) | RESPIRATORY_TRACT | Status: DC
  Filled 2015-11-27 (×2): qty 0.5

## 2015-11-27 MED ORDER — SENNOSIDES-DOCUSATE SODIUM 8.6-50 MG PO TABS
1.0000 | ORAL_TABLET | Freq: Every day | ORAL | Status: DC
  Administered 2015-11-27 – 2015-11-30 (×4): 1
  Filled 2015-11-27 (×4): qty 1

## 2015-11-27 MED ORDER — CARVEDILOL 6.25 MG PO TABS
6.2500 mg | ORAL_TABLET | Freq: Two times a day (BID) | ORAL | Status: DC
  Administered 2015-11-27 – 2015-12-01 (×8): 6.25 mg via ORAL
  Filled 2015-11-27 (×8): qty 1

## 2015-11-27 MED ORDER — AMIODARONE HCL 200 MG PO TABS
200.0000 mg | ORAL_TABLET | Freq: Every day | ORAL | Status: DC
  Administered 2015-11-28 – 2015-12-01 (×4): 200 mg via ORAL
  Filled 2015-11-27 (×4): qty 1

## 2015-11-27 NOTE — Progress Notes (Signed)
  Subjective: Making progress on rehab- passed swallow test and trach downsized   # 6 nsr Recheck cxr tomorrow- may need L US guided thoracentesis before DC- will f/u CXR  Objective: Vital signs in last 24 hours: Temp:  [98.5 F (36.9 C)-98.8 F (37.1 C)] 98.8 F (37.1 C) (04/21 0524) Pulse Rate:  [69-79] 79 (04/21 0524) Cardiac Rhythm:  [-]  Resp:  [16-19] 19 (04/21 0524) BP: (99-110)/(54-67) 110/62 mmHg (04/21 0536) SpO2:  [91 %-100 %] 91 % (04/21 0524) Weight:  [212 lb 15.4 oz (96.6 kg)] 212 lb 15.4 oz (96.6 kg) (04/21 0524)  Hemodynamic parameters for last 24 hours:  stable  Intake/Output from previous day: 04/20 0701 - 04/21 0700 In: 360 [P.O.:360] Out: 1450 [Urine:1450] Intake/Output this shift:         Exam    General- alert and comfortable   Lungs- clear without rales, wheezes   Cor- regular rate and rhythm, no murmur , gallop   Abdomen- soft, non-tender   Extremities - warm, non-tender, minimal edema   Neuro- oriented, appropriate, no focal weakness   Lab Results:  Recent Labs  11/25/15 0442 11/26/15 0553  WBC 6.4 6.5  HGB 9.8* 10.4*  HCT 32.1* 33.9*  PLT 187 202   BMET:  Recent Labs  11/25/15 0442 11/26/15 0553  NA 141 139  K 3.8 3.1*  CL 103 97*  CO2 30 29  GLUCOSE 121* 110*  BUN 46* 35*  CREATININE 1.56* 1.83*  CALCIUM 8.5* 8.7*    PT/INR: No results for input(s): LABPROT, INR in the last 72 hours. ABG    Component Value Date/Time   PHART 7.496* 11/19/2015 1616   HCO3 24.0 11/19/2015 1616   TCO2 23 11/19/2015 1742   ACIDBASEDEF TEST WILL BE CREDITED 11/01/2015 0446   O2SAT 66.5 11/22/2015 0422   CBG (last 3)   Recent Labs  11/24/15 1151  GLUCAP 101*    Assessment/Plan: S/P   Appreciate excellent rehab care   LOS: 3 days    Kathlee Nations Trigt III 11/27/2015

## 2015-11-27 NOTE — Telephone Encounter (Signed)
Sildenafil 20 mg TID approved by [employer] [place] through 11/26/16.   [name]. Bonnye Fava, PharmD, BCPS, CPP Clinical Pharmacist Pager: [phone] Phone: [phone] 11/27/2015 3:21 PM

## 2015-11-27 NOTE — Progress Notes (Signed)
[facility]     PROGRESS NOTE    Subjective/Complaints: Good night. Tolerated capping well. Trach removed.  Has questions about sternal precautions and when he can return to work.   ROS: Pt denies fever, rash/itching, headache, blurred or double vision, nausea, vomiting, abdominal pain, diarrhea,   shortness of breath, palpitations, dysuria, dizziness, neck or back pain, bleeding, anxiety, or depression   Objective: Vital Signs: Blood pressure 110/62, pulse 75, temperature 98.8 F (37.1 C), temperature source Oral, resp. rate 18, height 5\' 10"  (1.778 m), weight 96.6 kg (212 lb 15.4 oz), SpO2 95 %. No results found.  Recent Labs  11/25/15 0442 11/26/15 0553  WBC 6.4 6.5  HGB 9.8* 10.4*  HCT 32.1* 33.9*  PLT 187 202    Recent Labs  11/25/15 0442 11/26/15 0553  NA 141 139  K 3.8 3.1*  CL 103 97*  GLUCOSE 121* 110*  BUN 46* 35*  CREATININE 1.56* 1.83*  CALCIUM 8.5* 8.7*   CBG (last 3)   Recent Labs  11/24/15 1151  GLUCAP 101*    Wt Readings from Last 3 Encounters:  11/27/15 96.6 kg (212 lb 15.4 oz)  11/24/15 103.7 kg (228 lb 9.9 oz)  10/20/15 116.121 kg (256 lb)    Physical Exam:  Constitutional: He is oriented to person, place, and time. He appears well-developed and well-nourished.  Trach with PMSV and strong voice noted.  HENT:  Head: Normocephalic and atraumatic.  Mouth/Throat: Oropharynx is clear and moist.  Eyes: Conjunctivae are normal. Pupils are equal, round, and reactive to light.  Neck: Neck supple.  #6 cuffed trach in place-capped Cardiovascular: Normal rate and regular rhythm. Exam reveals distant heart sounds.  Respiratory: Effort normal. No respiratory distress. He has decreased breath sounds in the left middle field and the left lower field. He has no wheezes. He has rhonchi.  GI: He exhibits distension. Bowel sounds are increased. There is no tenderness. There is no rebound.  Protuberant.   Musculoskeletal: He exhibits edema.  BLE with edema tibially and 2+ pedally with unna boots in place.  Neurological: He is alert and oriented to person, place, and time.  Wet voice but speech clear. Follows commands without difficulty. UE 3-4/5 prox to distal. LE: 3/5 HF, 3+ KE and 4/5 ADF/PF. No gross sensory deficits. Cognitively appropriate with reasonable insight and awareness.  Skin: Skin is warm and dry.  Sternal incision clean and dry with scabs. Rash on left buttocks--drug eruption per wife. Irritated area intergluteal cleft. Right leg donor site with staples and intact  Psychiatric: He has a normal mood and affect. His behavior is irritable, impulsive   Assessment/Plan: 1. Weakness, dysphagia, and functional deficits secondary to multiple medical issues which require 3+ hours per day of interdisciplinary therapy in a comprehensive inpatient rehab setting. Physiatrist is providing close team supervision and 24 hour management of active medical problems listed below. Physiatrist and rehab team continue to assess barriers to discharge/monitor patient progress toward functional and medical goals.  Function:  Bathing Bathing position   Position: Shower  Bathing parts Body parts bathed by patient: Right arm, Left arm, Chest, Abdomen, Front perineal area, Right upper leg, Left upper leg, Left lower leg Body parts bathed by helper: Buttocks, Right lower leg, Left lower leg, Back  Bathing assist Assist Level: Touching or steadying assistance(Pt > 75%)      Upper Body Dressing/Undressing Upper body dressing   What is the patient wearing?: Pull over shirt/dress  Pull over shirt/dress - Perfomed by patient: Thread/unthread right sleeve, Pull shirt over trunk, Thread/unthread left sleeve, Put head through opening          Upper body assist Assist Level: Set up   Set up : To obtain clothing/put away  Lower Body Dressing/Undressing Lower body dressing   What is the patient  wearing?: Underwear, Pants, Non-skid slipper socks Underwear - Performed by patient: Thread/unthread right underwear leg, Thread/unthread left underwear leg, Pull underwear up/down   Pants- Performed by patient: Thread/unthread right pants leg, Thread/unthread left pants leg, Pull pants up/down   Non-skid slipper socks- Performed by patient: Don/doff right sock, Don/doff left sock                    Lower body assist Assist for lower body dressing: Supervision or verbal cues      Toileting Toileting   Toileting steps completed by patient: Adjust clothing prior to toileting, Performs perineal hygiene, Adjust clothing after toileting   Toileting Assistive Devices: Grab bar or rail  Toileting assist Assist level: Supervision or verbal cues   Transfers Chair/bed transfer   Chair/bed transfer method: Stand pivot, Ambulatory Chair/bed transfer assist level: Supervision or verbal cues Chair/bed transfer assistive device: Armrests     Locomotion Ambulation     Max distance: 150 Assist level: Supervision or verbal cues   Wheelchair Wheelchair activity did not occur: N/A        Cognition Comprehension Comprehension assist level: Follows complex conversation/direction with no assist  Expression Expression assist level: Expresses complex ideas: With extra time/assistive device  Social Interaction Social Interaction assist level: Interacts appropriately with others with medication or extra time (anti-anxiety, antidepressant).  Problem Solving Problem solving assist level: Solves basic problems with no assist  Memory Memory assist level: More than reasonable amount of time   Medical Problem List and Plan: 1. Weakness and functional deficits secondary to Debility.   -continue therapies  -had questions about return to work---discussed with patient in general. He will also need to review with his surgeon  -I would assume that sternal precautions could be lifted as his CABG was a  month ago. 2. DVT Prophylaxis/Anticoagulation: Pharmaceutical: Lovenox indicated 3. Pain Management: tylenol prn/percocet tolerated 4. Mood: LCSW to follow for evaluation and support.  5. Neuropsych: This patient is capable of making decisions on his own behalf. 6. Skin/Wound Care: continue local care. Occlusive dressing to trach.  7. Fluids/Electrolytes/Nutrition: encourage po/fluids. 8. CAD s/p CABG: Continue sternal precautions. Monitor for symptoms with increased activity. On ASA and statin.  9. HTN: Monitor BP bid.  10. Acute combined systolic and diastolic CHF: Daily weights. Demadex to be increased in am. On hydralazine, demadex, sildenafil and statin.   -continue unna dressings 11. Acute on chronic respiratory failure/OSA/OHS:trach out  -Continue xopenex--continue Xopenex tid and scheduled  -left pleural effusion persistent---mgt/follow up per CVTS 12. Shocked liver: follow up labs reviewed and all wnl 13. Adynamic ileus: Continue reglan---qid schedule  -tolerating po diet so far  -stool softener/lax 14. ABLA: Stable.  15. Complete HB: resolved. Had V Fib arrest--may need ICD v/s Life Vest at discharge? 16. A flutter: In NSR on amiodarone--no anticoagulation unless A fib/A flutter recurs.  17. Acute renal failure: Likely due to diuresis.  Encourage fluids   -kdur daily, .  -Recheck bmet tomorrow    18. Dysphagia:  -on D2 diet currently without any issues    LOS (Days) 3 A FACE TO FACE EVALUATION WAS PERFORMED  Elad Macphail T  11/27/2015 9:44 AM

## 2015-11-27 NOTE — Progress Notes (Signed)
Nutrition Follow-up  DOCUMENTATION CODES:   Obesity unspecified  INTERVENTION:  Provide Ensure Enlive po once daily, each supplement provides 350 kcal and 20 grams of protein.  Discontinue Prostat.   Encourage adequate PO intake.   NUTRITION DIAGNOSIS:   Inadequate oral intake related to dysphagia as evidenced by meal completion < 50%; improved  GOAL:   Patient will meet greater than or equal to 90% of their needs; met  MONITOR:   PO intake, Supplement acceptance, Diet advancement, Weight trends, Labs, I & O's  REASON FOR ASSESSMENT:   Consult Diet education  ASSESSMENT:   55 y.o. male with history of HTN, R-CAS who was admitted via [facility] with chest pain due to STEMI. He was found to have severe CAD on cardiac cath as well as > 80% R-ICA stenosis. Acute on combined systolic/diastolic CHF with mild volume overload treated with diuretics. He underwent CABG X 4 with placement  Extubated with trach placed. Cortrak NGT for nutrition support. NGT pulled out 4/18. Diet has been advanced. Trach out yesterday.   Meal completion has been 100%. Intake has improved since diet advancement. Pt has Ensure ordered BID and Prostat ordered once daily. RD to modify orders as intake has improved. Pt encouraged to eat his food at meals.   Labs and medications reviewed.   Diet Order:  DIET DYS 2 Room service appropriate?: Yes; Fluid consistency:: Thin  Skin:  Reviewed, no issues  Last BM:  4/20  Height:   Ht Readings from Last 1 Encounters:  11/24/15 _0  (1.778 m)    Weight:   Wt Readings from Last 1 Encounters:  11/27/15 212 lb 15.4 oz (96.6 kg)    Ideal Body Weight:  75.4 kg  BMI:  Body mass index is 30.56 kg/(m^2).  Estimated Nutritional Needs:   Kcal:  1900-2100  Protein:  100-115 grams  Fluid:  1.9 - 2.1 L/day  EDUCATION NEEDS:   Education needs addressed  Corrin Parker, MS, RD, LDN Pager # [phone] After hours/ weekend pager # [phone]

## 2015-11-27 NOTE — Procedures (Signed)
Did well over-night w/ capping trial Trach removed. Occlusive dressing applied   Simonne Martinet ACNP-BC [facility] Pulmonary/Critical Care Pager # [phone] OR # [phone] if no answer

## 2015-11-27 NOTE — Progress Notes (Signed)
Social Work Assessment and Plan  Patient Details  Name: Christopher [name] MRN: [id number] Date of Birth: 10-03-1960  Today's Date: 11/25/2015  Problem List:  Patient Active Problem List   Diagnosis Date Noted  . Debility 11/24/2015  . Coronary artery disease involving native coronary artery of native heart without angina pectoris   . VT (ventricular tachycardia) ([place])   . Ileus ([place])   . Hyperosmolality and/or hypernatremia   . Acute respiratory failure with hypoxia ([place])   . Chronic obstructive pulmonary disease ([place])   . Acute renal failure ([place])   . Ventilator dependence ([place])   . Tracheostomy status ([place])   . S/P CABG x 4 10/27/2015  . CAD (coronary artery disease)   . Hypertensive heart disease with heart failure ([place])   . Carotid artery stenosis   . Acute combined systolic and diastolic heart failure ([place]) 10/20/2015  . Complete heart block ([place]) 10/20/2015  . Acute MI, inferior wall, initial episode of care ([place]) 10/20/2015  . TIA (transient ischemic attack) 12/25/2014  . Essential hypertension 12/25/2014  . Hyperlipidemia 12/25/2014   Past Medical History:  Past Medical History  Diagnosis Date  . Hypertension   . TIA (transient ischemic attack)   . Stenosis of right carotid artery   . Complete heart block ([place]) 10/20/2015   Past Surgical History:  Past Surgical History  Procedure Laterality Date  . Arm surgery Right 2003  . Eye surgery    . Laceration repair    . Cardiac catheterization N/A 10/20/2015    Procedure: Left Heart Cath and Coronary Angiography;  Surgeon: Sherren Mocha, MD;  Location: [facility];  Service: Cardiovascular;  Laterality: N/A;  . Coronary artery bypass graft N/A 10/27/2015    Procedure: CORONARY ARTERY BYPASS GRAFTING (CABG) x four, using left internal mammary artery and rt leg greater saphenous vein harvested endoscopically;  Surgeon: Ivin Poot, MD;  Location: [facility];  Service: Open Heart Surgery;  Laterality: N/A;  . Tee  without cardioversion N/A 10/27/2015    Procedure: TRANSESOPHAGEAL ECHOCARDIOGRAM (TEE);  Surgeon: Ivin Poot, MD;  Location: [facility];  Service: Open Heart Surgery;  Laterality: N/A;  . Endarterectomy Right 10/27/2015    Procedure: RIGHT ENDARTERECTOMY CAROTID with patch angioplasty using Xenosure bovine pericardium patch;  Surgeon: Conrad Pea Ridge, MD;  Location: [facility];  Service: Vascular;  Laterality: Right;  . Cardiac catheterization N/A 11/02/2015    Procedure: Right/Left Heart Cath and Coronary/Graft Angiography;  Surgeon: Larey Dresser, MD;  Location: [facility];  Service: Cardiovascular;  Laterality: N/A;   Social History:  reports that he quit smoking about 13 years ago. He has never used smokeless tobacco. He reports that he uses illicit drugs (Marijuana). He reports that he does not drink alcohol.  Family / Support Systems Marital Status: Single (in a commited relationship for 6 years) Patient Roles: Partner, Other (Comment) (employee) Spouse/Significant Other: Bobbye Morton - girlfriend - [phone] Anticipated Caregiver: self and girlfriend Ability/Limitations of Caregiver: Girlfriend works but has leave until 01/18/16. Caregiver Availability: 24/7 (24/7 initially and then intermittent when Ms. 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  They are understanding of his current medical condition. Legal History/Current Legal Issues: none reported Guardian/Conservator: N/A - MD has stated that pt is able to make his own decisions.   Abuse/Neglect Physical Abuse: Denies Verbal  Abuse: Denies Sexual Abuse: Denies Exploitation of patient/patient's resources: Denies Self-Neglect: Denies  Emotional Status Pt's affect, behavior and adjustment status: Pt is in good spirits and is motivated to work hard so that he can get home.  He's had some moments of frustration, but overall is feeling pretty well to be nearing the end of his hospitalization. Recent Psychosocial Issues: none, besides this current medical situation Psychiatric History: Pt calls himself "anxious" but he does not have panic attacks nor require any medication.  It's more that he is "antsy" and can't sit still.  Always has to be doing something. Substance Abuse History: none reported  Patient / Family Perceptions, Expectations & Goals Pt/Family understanding of illness & functional limitations: Pt/girlfriend feel they have a good understanding of pt's condition and limitations and they do not have any questions at this time. Premorbid pt/family roles/activities: Pt works full time and enjoys cooking, baking, and traveling the state. Anticipated changes in roles/activities/participation: Pt hopes to resume these as soon as possible. Pt/family expectations/goals: Pt wants to get home and to resume working and his hobbies.  Community Resources [employer]: None Premorbid Home Care/DME Agencies: None Transportation available at discharge: girlfriend Resource referrals recommended: Neuropsychology  Discharge Planning Living Arrangements: Spouse/significant other Support Systems: Spouse/significant other, Other relatives Type of Residence: Private residence Insurance Resources: Multimedia programmer (specify) ([employer]) Pensions consultant: Employment, Secondary school teacher Screen Referred: No Living Expenses: Education officer, community Management: Patient Does the patient have any problems obtaining your medications?: No Home Management: Pt and girlfriend do this together.  Pt can "fix anything" per  girlfriend. Patient/Family Preliminary Plans: Pt plans to go to his apartment with his girlfriend. Barriers to Discharge: Steps Expected length of stay: 7 days  Clinical Impression CSW met with pt and his girlfriend to introduce self and role of CSW, as well as to complete assessment.  Pt and girlfriend were both very open with CSW and invested in the rehab process.  Pt stated that he is doing well emotionally and he would let CSW know if anything changes.  He self reports as "anxious", but it seems he more just likes to be busy and active and not sit still.  Pt agreed with that assessment.  Pt will have help from his girlfriend when he goes home initially, but then she will go back to work and come home at lunch to check on him.  He does have several stairs to enter his apartment and team is recommending supervision for these.  CSW will help pt to obtain a new male PCP, as he is not returning to his former PCP due to feeling they missed this medical crisis pt was in.  CSW will continue to follow pt and assist with d/c planning, as needed.  Niki Cosman, Silvestre Mesi 11/26/2015, 1:26 PM

## 2015-11-27 NOTE — Progress Notes (Signed)
Patient ID: Christopher [name], male   DOB: [birth year], 55 y.o.   MRN: [id number]    SUBJECTIVE:  Tracheostomy out yesterday.  No exertional dyspnea or chest pain.  Continues to work with PT.    Scheduled Meds: . amiodarone  200 mg Oral BID  . antiseptic oral rinse  7 mL Mouth Rinse q12n4p  . aspirin  81 mg Oral Daily  . atorvastatin  80 mg Oral q1800  . carvedilol  6.25 mg Oral BID WC  . chlorhexidine  15 mL Mouth Rinse BID  . enoxaparin (LOVENOX) injection  40 mg Subcutaneous Q24H  . feeding supplement (ENSURE ENLIVE)  237 mL Oral Q1500  . hydrALAZINE  25 mg Oral Q8H  . levalbuterol  1.25 mg Nebulization TID  . metoCLOPramide  10 mg Oral TID AC & HS  . pantoprazole sodium  40 mg Per Tube Daily  . potassium chloride  40 mEq Oral Daily  . QUEtiapine  50 mg Oral QHS  . senna-docusate  1 tablet Per Tube QHS  . sildenafil  40 mg Oral TID  . silver sulfADIAZINE   Topical BID  . sodium chloride flush  10-40 mL Intracatheter Q12H  . spironolactone  12.5 mg Oral Daily  . torsemide  60 mg Oral Daily  . triamcinolone cream   Topical BID   Continuous Infusions:   PRN Meds:.acetaminophen, alum & mag hydroxide-simeth, bisacodyl, diphenhydrAMINE, guaiFENesin-dextromethorphan, levalbuterol, ondansetron (ZOFRAN) IV, oxyCODONE-acetaminophen **AND** oxyCODONE, prochlorperazine **OR** prochlorperazine **OR** prochlorperazine, sodium chloride, sodium chloride flush, sodium chloride flush, sodium phosphate, traMADol, traZODone    Filed Vitals:   11/26/15 2053 11/27/15 0524 11/27/15 0536 11/27/15 0859  BP:  99/54 110/62   Pulse: 73 79  75  Temp:  98.8 F (37.1 C)    TempSrc:  Oral    Resp: 16 19  18   Height:      Weight:  212 lb 15.4 oz (96.6 kg)    SpO2: 98% 91%  95%    Intake/Output Summary (Last 24 hours) at 11/27/15 1056 Last data filed at 11/27/15 0851  Gross per 24 hour  Intake    480 ml  Output   1450 ml  Net   -970 ml    LABS: Basic Metabolic Panel:  Recent Labs  66/29/47 0442 11/26/15 0553  NA 141 139  K 3.8 3.1*  CL 103 97*  CO2 30 29  GLUCOSE 121* 110*  BUN 46* 35*  CREATININE 1.56* 1.83*  CALCIUM 8.5* 8.7*   Liver Function Tests:  Recent Labs  11/25/15 0442  AST 56*  ALT 62  ALKPHOS 68  BILITOT 0.7  PROT 5.9*  ALBUMIN 2.4*   No results for input(s): LIPASE, AMYLASE in the last 72 hours. CBC:  Recent Labs  11/25/15 0442 11/26/15 0553  WBC 6.4 6.5  NEUTROABS 4.5  --   HGB 9.8* 10.4*  HCT 32.1* 33.9*  MCV 89.9 89.4  PLT 187 202   Cardiac Enzymes: No results for input(s): CKTOTAL, CKMB, CKMBINDEX, TROPONINI in the last 72 hours. BNP: Invalid input(s): POCBNP D-Dimer: No results for input(s): DDIMER in the last 72 hours. Hemoglobin A1C: No results for input(s): HGBA1C in the last 72 hours. Fasting Lipid Panel: No results for input(s): CHOL, HDL, LDLCALC, TRIG, CHOLHDL, LDLDIRECT in the last 72 hours. Thyroid Function Tests: No results for input(s): TSH, T4TOTAL, T3FREE, THYROIDAB in the last 72 hours.  Invalid input(s): FREET3 Anemia Panel: No results for input(s): VITAMINB12, FOLATE, FERRITIN, TIBC, IRON, RETICCTPCT in the last  72 hours.   PHYSICAL EXAM General: NAD. Sitting in the chair.  Neck: Thick, JVP difficult to assess. Tracheostomy.  Lungs: Decreased breath sounds left base.  CV: Nondisplaced PMI.  Heart regular S1/S2, no S3/S4, no murmur.  Legs wrapped. 1+ edema above wraps.     Abdomen: Soft, nontender, no hepatosplenomegaly, no distention.  Neurologic: Alert and oriented x 3.  Psych: Normal affect. Extremities: No clubbing or cyanosis.    ASSESSMENT AND PLAN:  1. CAD: S/p late presentation inferior MI and CABG x 4. Continue statin, ASA. Coronary angiography 3/27 with multiple VT episodes showed patent LIMA-LAD and SVG-RCA. Patent SVG-ramus and SVG-D but slow flow in both grafts with diffusely diseased small target vessels.  - Continue ASA 81 and statin.  2. Cardiogenic shock: Acute systolic  CHF with RV failure post inferior MI with suspected RV involvement. EF 25-30% on echo (difficult images on 3/24 echo). He has significant RV failure from suspected RV infarct. 3/27 IABP placed to try to allow decrease in milrinone with frequent VT. 3/31 echo with EF 30-35%. IABP out 4/2. Milrinone off 4/7. Volume status improved. - Needs BMET today.   - Continue torsemide 60 mg daily.  - Can wear compression stockings instead of Unna boots. - Continue spironolactone 12.5 mg daily.  - Increase Coreg to 6.25 mg bid.   - Continue sildenafil 40 tid.  - Continue hydralazine 25 mg tid for afterload reduction (no Imdur with sildenafil use).  3. Complete heart block: Resolved.  4. VF arrest 3/24 early am and again at night 3/24.  - Continue po amio, decrease to once daily.  - Will order Lifevest. Plan to repeat ECHO in a couple of months for ICD evaluation.  5. AKI: Creatinine stable, follow carefully.  Needs BMET today.  6. ID: Possible LLL PNA. He completed course of vancomycin/levofloxacin.  7. Pulmonary: S/P Trach 4/5 on trach collar. [name] now out. 8. Dysphagia: Passed swallow study.  9. Atrial flutter:  Regular pulse. He was off AC due to trach bleeding. On DVT prophylaxis. Will not start coumadin unless AF/AFL recurs. 10. Deconditioning: continue PT.  We'll see again on Monday.   [name] 11/27/2015 10:56 AM

## 2015-11-27 NOTE — IPOC Note (Signed)
Overall Plan of Care [facility]) Patient Details Name: Christopher [name] MRN: [id number] DOB: May 24, 1961  Admitting Diagnosis: Debility  Hospital Problems: Principal Problem:   Debility Active Problems:   Acute combined systolic and diastolic heart failure (HCC)   S/P CABG x 4   Tracheostomy status (HCC)   Chronic obstructive pulmonary disease (HCC)     Functional Problem List: Nursing Bowel, Edema, Endurance, Medication Management, Nutrition, Skin Integrity  PT Balance, Motor, Safety, Endurance  OT Balance, Endurance, Safety  SLP    TR         Basic ADL's: OT Bathing, Dressing, Toileting     Advanced  ADL's: OT Simple Meal Preparation     Transfers: PT Bed to Chair, Car, Furniture, Floor  OT Toilet, Tub/Shower     Locomotion: PT Ambulation, Stairs     Additional Impairments: OT None  SLP Swallowing, Communication expression    TR      Anticipated Outcomes Item Anticipated Outcome  Self Feeding Independent  Swallowing  Mod I with least restrictive diet   Basic self-care  Mod I  Toileting  Mod I   Bathroom Transfers Mod I  Bowel/Bladder  Mod I   Transfers  Mod I  Locomotion  Mod I   Communication  Mod I   Cognition     Pain  n/a  Safety/Judgment  Mod I   Therapy Plan: PT Intensity: Minimum of 1-2 x/day ,45 to 90 minutes PT Frequency: 5 out of 7 days PT Duration Estimated Length of Stay: 7-10 days  OT Intensity: Minimum of 1-2 x/day, 45 to 90 minutes OT Frequency: 5 out of 7 days OT Duration/Estimated Length of Stay: 7-10 days SLP Intensity: Minumum of 1-2 x/day, 30 to 90 minutes SLP Frequency: 3 to 5 out of 7 days SLP Duration/Estimated Length of Stay: 7-10 days        Team Interventions: Nursing Interventions Patient/Family Education, Bowel Management, Disease Management/Prevention, Skin Care/Wound Management, Medication Management, Dysphagia/Aspiration Precaution Training  PT interventions Ambulation/gait training, Designer, jewellery, Community reintegration, Discharge planning, Disease management/prevention, DME/adaptive equipment instruction, Functional mobility training, Neuromuscular re-education, Pain management, Patient/family education, Skin care/wound management, Psychosocial support, Stair training, Therapeutic Activities, Therapeutic Exercise, UE/LE Strength taining/ROM, UE/LE Coordination activities, Visual/perceptual remediation/compensation, Wheelchair propulsion/positioning  OT Interventions Warden/ranger, Discharge planning, Community reintegration, Fish farm manager, Functional mobility training, Pain management, Patient/family education, Therapeutic Activities, Therapeutic Exercise, Self Care/advanced ADL retraining, UE/LE Strength taining/ROM, UE/LE Coordination activities  SLP Interventions Cueing hierarchy, Functional tasks, Patient/family education, Therapeutic Activities, Environmental controls, Internal/external aids, Dysphagia/aspiration precaution training, Speech/Language facilitation  TR Interventions    SW/CM Interventions Discharge Planning, Psychosocial Support, Patient/Family Education    Team Discharge Planning: Destination: PT-Home ,OT- Home , SLP-Home Projected Follow-up: PT-Outpatient PT, Home health PT, OT-  Home health OT, Outpatient OT, SLP-Outpatient SLP Projected Equipment Needs: PT-To be determined, OT- To be determined, SLP-None recommended by SLP Equipment Details: PT- , OT-  Patient/family involved in discharge planning: PT- Patient, Family member/caregiver,  OT-Patient, Family member/caregiver, SLP-Patient, Family member/caregiver  MD ELOS: 7-8 days Medical Rehab Prognosis:  Excellent Assessment: The patient has been admitted for CIR therapies with the diagnosis of debility after multiple medical issues. The team will be addressing functional mobility, strength, stamina, balance, safety, adaptive techniques and equipment, self-care, bowel and  bladder mgt, patient and caregiver education, trach care, nutrition, ego support, community reintegration, swallowing. Goals have been set at mod I for mobility, self-care and swallowing/nutrition. [name], MD, [place]  See Team Conference Notes for weekly updates to the plan of care

## 2015-11-27 NOTE — Progress Notes (Signed)
Physical Therapy Session Note  Patient Details  Name: Christopher [name] MRN: [id number] Date of Birth: 15-Jan-1961  Today's Date: 11/27/2015 PT Individual Time: 1300-1420 PT Individual Time Calculation (min): 80 min Pt missed 10 min at end of session due to fatigue.  Short Term Goals: Week 1:  PT Short Term Goal 1 (Week 1): LTG=STG due to short LOS  Skilled Therapeutic Interventions/Progress Updates:    Session focused on overall endurance, gait on unit and outdoors over uneven surfaces, education on energy conservation and activity tolerance and community re-integration (pt declined option for going on outing prior to d/c), stair negotiation outside in community setting, [place] Level A therex for strength and balance (LAQ, hip abduction, hamstring curls, mini squats, and tandem balance), dynamic standing balance while performing fine motor skill task, and trunk elongation/shortening activity seated EOM for pelvic dissociation and core stabilization. Pt performed all mobility at overall supervision level except steady assist for stairs and intermittent steady assist with gait outdoors due to fatigue. Pt requiring frequent rest breaks due to increasing fatigue and ultimately needed to end session early.   Therapy Documentation Precautions:  Precautions Precautions: Sternal, Fall Restrictions Weight Bearing Restrictions: No Other Position/Activity Restrictions: Sternal preacutions   Pain:  Denies pain. C/o fatigue.   See Function Navigator for Current Functional Status.   Therapy/Group: Individual Therapy  Karolee Stamps Darrol Poke, PT, DPT  11/27/2015, 2:23 PM

## 2015-11-27 NOTE — Telephone Encounter (Signed)
Submitted PA and received approval for Revatio 20 mg po TID through [employer]. Coverage to start today, 11/27/15 through 11/26/2016.  Case [id number] Product Name:ST Revatio  75 PA and 70 - [place] and Preferred and Essential Status:Approved Coverage Start Date:11/27/2015 Coverage End Date:11/26/2016   Hillery Aldo, [place].D., BCPS PGY2 Cardiology Pharmacy Resident Pager: [phone] 11/27/2015, 12:30 PM

## 2015-11-27 NOTE — Progress Notes (Signed)
PULMONARY / CRITICAL CARE MEDICINE   Name: Christopher [name] MRN: [id number] DOB: September 20, 1960    ADMISSION DATE:  11/24/2015 CONSULTATION DATE:  11/03/2015  REFERRING MD: Dr. Donata Clay / CVTS  CHIEF COMPLAINT:  Acute Hypoxic Respiratory Failure  HISTORY OF PRESENT ILLNESS:   [name] w/ ischemic cardiomyopathy following MI. Patient underwent CABG & CEA. Post-operative course complicated by arrhythmia and biventricular failure, HCAP, heart block. Improving  SUBJECTIVE: Feeling stronger   REVIEW OF SYSTEMS:  Feels well. Getting stronger  VITAL SIGNS: BP 110/62 mmHg  Pulse 75  Temp(Src) 98.8 F (37.1 C) (Oral)  Resp 18  Ht 5\' 10"  (1.778 m)  Wt 212 lb 15.4 oz (96.6 kg)  BMI 30.56 kg/m2  SpO2 95% Room air   INTAKE / OUTPUT: I/O last 3 completed shifts: In: 360 [P.O.:360] Out: 2950 [Urine:2950]  PHYSICAL EXAMINATION: General:  Comfortable. Walking around room.  Neuro:  Awake, oriented. No focal def  HEENT: occluded Trach in place.; now w/ #4 cuffless w/ occlusive cap. Has excellent phonation and able to cough sputum from mouth. No more bloody secretions Cardiovascular:  Regular rate. No MRG Lungs:  Clear, no wheezing, crackles Abdomen:  Soft. Protuberant. Normal BS. Nontender. Integument:  No rash  LABS:  BMET  Recent Labs Lab 11/24/15 0515 11/25/15 0442 11/26/15 0553  NA 141 141 139  K 5.0 3.8 3.1*  CL 103 103 97*  CO2 28 30 29   BUN 48* 46* 35*  CREATININE 1.51* 1.56* 1.83*  GLUCOSE 104* 121* 110*   Electrolytes  CBC  Recent Labs Lab 11/24/15 0515 11/25/15 0442 11/26/15 0553  WBC 8.6 6.4 6.5  HGB 10.9* 9.8* 10.4*  HCT 36.0* 32.1* 33.9*  PLT 226 187 202      MICROBIOLOGY: Tracheal Asp Ctx 3/27:  Oral [place] Urine Ctx 3/24:  Negative MRSA PCR 3/20:  Negative MRSA PCR 3/14:  Negative C diff 4/10 > Negative  ANTIBIOTICS: Zosyn 3/29 - 4/1 Vancomcyin 3/21 - 3/22; 3/29 - 4/7 Cefuroxime 3/21 - 3/23 (periop prophylaxis) Elita Quick 3/27 -  3/29 Levaquin 4/1 - 4/7 Flagyl 4/9 >> 4/11  SIGNIFICANT EVENTS: 3/14 - Admit 3/14 - LHC 3/21 - CABG 3/27 - IABP replaced >> 4/1 3/28 - Chest Tubes removed. RHC & LHC.  LINES/TUBES: Trach 6.0 (JY) 4/5 >> downsized 4 cuffless 4/20-->4/21 OETT 7.5 Extubated 3/22; 3.24 - 4/5  ASSESSMENT / PLAN:  Tracheostomy dependence s/p prolonged vent dependence after CABG, CEA; further complicated by decompensated systolic heart failure, cardiogenic shock & MODS: now making excellent progress in rehab tolerating PMV & diet.  Now decannulated.  Plan Keep occlusive dressing x 24hrs Then can just change w band-aid daily I have provided him w/ my contact # for trach clinic should he or his wife have concerns   Simonne Martinet ACNP-BC [facility] Pulmonary/Critical Care Pager # [phone] OR # [phone] if no answer      11/27/2015, 9:14 AM

## 2015-11-27 NOTE — Progress Notes (Signed)
Occupational Therapy Session Note  Patient Details  Name: EMERT SORGI MRN: [id number] Date of Birth: Dec 24, 1960  Today's Date: 11/27/2015 OT Individual Time: 0800-0830 OT Individual Time Calculation (min): 30 min    Short Term Goals: Week 1:  OT Short Term Goal 1 (Week 1): STG=LTG due to LOS  Skilled Therapeutic Interventions/Progress Updates:    Pt seen for OT session focusing on establishmen of HEP and ADL retraining. Pt sitting up in recliner upon arrival with wife present eating breakfast, agreeable to tx session.  He was provided with handout for theraputty and reviewed and demonstrated exercises with pt return demonstrating. Pt ambulated throughout room with close supervision to complete oral care standing at sink.  Educated extensively throughout session regarding energy conservation, pacing self out, and d/c planning.  Pt left sitting in recliner at end of session, all needs in reach.   Therapy Documentation Precautions:  Precautions Precautions: Sternal, Fall Restrictions Weight Bearing Restrictions: No Other Position/Activity Restrictions: Sternal preacutions Pain:   No/ denies pain  See Function Navigator for Current Functional Status.   Therapy/Group: Individual Therapy  Lewis, [place] Minor C 11/27/2015, 6:33 AM

## 2015-11-27 NOTE — Progress Notes (Signed)
Speech Language Pathology Daily Session Note  Patient Details  Name: Christopher [name] MRN: [id number] Date of Birth: 09-14-1960  Today's Date: 11/27/2015 SLP Individual Time: 1135-1200 SLP Individual Time Calculation (min): 25 min  Short Term Goals: Week 1: SLP Short Term Goal 1 (Week 1): Patient will consume current diet with minimal overt s/s of aspiration with Mod I for use of swallowing compensatory strategies. SLP Short Term Goal 2 (Week 1): Patient will perform pharyngeal strengthening exercises with Mod I.  SLP Short Term Goal 3 (Week 1): Patient will wear the PMSV during all waking hours with vitals remaining WFL and 100% intelligibility at the conversation level with Mod I.  SLP Short Term Goal 3 - Progress (Week 1): Met SLP Short Term Goal 4 (Week 1): Patient will independently donn/doff PMSV SLP Short Term Goal 4 - Progress (Week 1): Discontinued (comment) SLP Short Term Goal 5 (Week 1): Patient will participate in the MoCA to assess cognitive function.  SLP Short Term Goal 5 - Progress (Week 1): Met  Skilled Therapeutic Interventions: Skilled treatment session focused on dysphagia goals. Upon arrival, patient had been decannulated and required intermittent supervision verbal cues to apply pressure to stoma while coughing but was 100% intelligible in regards to vocal intensity. Patient consumed his lunch meal of Dys. 2 textures with thin liquids without overt s/s of aspiration and required intermittent verbal cues for use of swallowing compensatory strategies. Recommend trials of upgraded textures at next session.  Patient left upright in recliner with all needs within reach. Continue with current plan of care.    Function:  Eating Eating   Modified Consistency Diet: Yes Eating Assist Level: Supervision or verbal cues           Cognition Comprehension Comprehension assist level: Follows complex conversation/direction with no assist  Expression   Expression assist  level: Expresses complex ideas: With extra time/assistive device  Social Interaction Social Interaction assist level: Interacts appropriately with others with medication or extra time (anti-anxiety, antidepressant).  Problem Solving Problem solving assist level: Solves basic problems with no assist  Memory Memory assist level: More than reasonable amount of time    Pain No/Denies Pain   Therapy/Group: Individual Therapy  Dejai Schubach 11/27/2015, 3:53 PM

## 2015-11-27 NOTE — Progress Notes (Signed)
Occupational Therapy Session Note  Patient Details  Name: Christopher [name] MRN: [id number] Date of Birth: [birth year]  Today's Date: 11/27/2015 OT Individual Time: 4193-7902 OT Individual Time Calculation (min): 55 min   Short Term Goals: Week 1:  OT Short Term Goal 1 (Week 1): STG=LTG due to LOS  Skilled Therapeutic Interventions/Progress Updates:  Pt found seated in recliner with girlfriend present. Pt with no complaints of pain. Pt stated he already washed up this am and had new una boots donned by ortho tech. Pt ambulated from room to therapy gym with minimal unsteadiness, loosing balance once in gym with obstacles in the way; therapist had to assist with correcting so patient did not fall. Pt sat edge of mat and worked on doffing socks, then donning shoes with LH shoe horn. Pt then performed 9-hole peg test:  Lhand=39.12 seconds, Rhand=27.82 seconds. Pt worked on threading small beads on string, 14 beads in a reasonable amount of time with minimal spillage and minimal frustration. Pt then worked on dynamic standing balance/tolerance/endurance. Pt bounced basketball and threw basketball with girlfriend while therapist provided min assist for balance. Pt dribbled basketball in hallway with min assist for balance from therapist. Pt sat in chairs outside of elevator due to fatigue. Pt with increased fatigue this session, pt stating that he woke up feeling tired. After rest break, pt started to ambulate back to gym but pt quickly requested to go back to room due to fatigue. Pt passed basketball back and forth with girlfriend as he walked back to his room. Once in room, pt sat in recliner. Discussed use of tub transfer bench in tub/shower combination and how they can purchase one on their own. At end of session, left pt seated in recliner with girlfriend and all needs within reach.   Therapy Documentation Precautions:  Precautions Precautions: Sternal, Fall Restrictions Weight Bearing  Restrictions: No Other Position/Activity Restrictions: Sternal preacutions  Vital Signs: Therapy Vitals Temp: 98.8 F (37.1 C) Temp Source: Oral Pulse Rate: 79 Resp: 19 BP: 110/62 mmHg Patient Position (if appropriate): Lying Oxygen Therapy SpO2: 91 % O2 Device: Not Delivered  See Function Navigator for Current Functional Status.  Therapy/Group: Individual Therapy  Edwin Cap , MS, OTR/L, CLT Pager: [phone]  11/27/2015, 12:49 PM

## 2015-11-28 ENCOUNTER — Inpatient Hospital Stay (HOSPITAL_COMMUNITY): Payer: BLUE CROSS/BLUE SHIELD | Admitting: Physical Therapy

## 2015-11-28 ENCOUNTER — Inpatient Hospital Stay (HOSPITAL_COMMUNITY): Payer: BLUE CROSS/BLUE SHIELD | Admitting: Occupational Therapy

## 2015-11-28 ENCOUNTER — Inpatient Hospital Stay (HOSPITAL_COMMUNITY): Payer: BLUE CROSS/BLUE SHIELD

## 2015-11-28 ENCOUNTER — Inpatient Hospital Stay (HOSPITAL_COMMUNITY): Payer: BLUE CROSS/BLUE SHIELD | Admitting: Speech Pathology

## 2015-11-28 LAB — BASIC METABOLIC PANEL
Anion gap: 12 (ref 5–15)
BUN: 27 mg/dL — AB (ref 6–20)
CHLORIDE: 94 mmol/L — AB (ref 101–111)
CO2: 28 mmol/L (ref 22–32)
CREATININE: 1.74 mg/dL — AB (ref 0.61–1.24)
Calcium: 8.4 mg/dL — ABNORMAL LOW (ref 8.9–10.3)
GFR calc Af Amer: 50 mL/min — ABNORMAL LOW (ref >60)
GFR calc non Af Amer: 43 mL/min — ABNORMAL LOW (ref >60)
GLUCOSE: 92 mg/dL (ref 65–99)
POTASSIUM: 2.9 mmol/L — AB (ref 3.5–5.1)
SODIUM: 134 mmol/L — AB (ref 135–145)

## 2015-11-28 NOTE — Progress Notes (Signed)
Christopher [name] is a 55 y.o. male 1961/07/16 [id number]  Subjective: No new complaints. No new problems. Slept well. Feeling OK.  Objective: Vital signs in last 24 hours: Temp:  [98 F (36.7 C)-98.1 F (36.7 C)] 98.1 F (36.7 C) (04/22 0518) Pulse Rate:  [70-73] 70 (04/22 0518) Resp:  [16-18] 18 (04/22 0518) BP: (108-113)/(57-65) 108/65 mmHg (04/22 0518) SpO2:  [94 %-98 %] 96 % (04/22 0518) Weight:  [216 lb 4.3 oz (98.1 kg)] 216 lb 4.3 oz (98.1 kg) (04/22 0518) Weight change: 3 lb 4.9 oz (1.5 kg) Last BM Date: 11/27/15  Intake/Output from previous day: 04/21 0701 - 04/22 0700 In: 480 [P.O.:480] Out: 1200 [Urine:1200] Last cbgs: CBG (last 3)  No results for input(s): GLUCAP in the last 72 hours.   Physical Exam General: No apparent distress; eating breakfast   HEENT: not dry Lungs: Normal effort. Lungs clear to auscultation, no crackles or wheezes. Cardiovascular: Regular rate and rhythm, no edema Abdomen: S/NT/ND; BS(+) Musculoskeletal:  unchanged Neurological: No new neurological deficits Wounds: tracheostomy dressed; chest scar ok    Skin: clear   Mental state: Alert, cooperative    Lab Results: BMET    Component Value Date/Time   NA 134* 11/28/2015 0454   K 2.9* 11/28/2015 0454   CL 94* 11/28/2015 0454   CO2 28 11/28/2015 0454   GLUCOSE 92 11/28/2015 0454   BUN 27* 11/28/2015 0454   CREATININE 1.74* 11/28/2015 0454   CALCIUM 8.4* 11/28/2015 0454   GFRNONAA 43* 11/28/2015 0454   GFRAA 50* 11/28/2015 0454   CBC    Component Value Date/Time   WBC 6.5 11/26/2015 0553   RBC 3.79* 11/26/2015 0553   HGB 10.4* 11/26/2015 0553   HCT 33.9* 11/26/2015 0553   PLT 202 11/26/2015 0553   MCV 89.4 11/26/2015 0553   MCH 27.4 11/26/2015 0553   MCHC 30.7 11/26/2015 0553   RDW 17.3* 11/26/2015 0553   LYMPHSABS 1.0 11/25/2015 0442   MONOABS 0.5 11/25/2015 0442   EOSABS 0.3 11/25/2015 0442   BASOSABS 0.0 11/25/2015 0442    Studies/Results: Dg Chest 2  View  11/28/2015  CLINICAL DATA:  Followup left pleural effusion and left lung base consolidation. EXAM: CHEST  2 VIEW COMPARISON:  11/24/2015 FINDINGS: Left pleural effusion is without significant change from the prior study. Associated parenchymal opacity is slightly improved. No new lung abnormalities. No right pleural effusion. No pneumothorax. Changes from CABG surgery are stable. Cardiac silhouette is borderline enlarged. No mediastinal widening or mediastinal or hilar masses. Right PICC and right internal jugular central venous line as well as tracheostomy tube and enteric tube have been removed since the prior study. IMPRESSION: 1. All lines and tubes have been removed. 2. Slight improvement in left lung base aeration. There is significant residual opacity reflecting a moderate effusion and either pneumonia or atelectasis or a combination. Electronically Signed   By: Amie Portland M.D.   On: 11/28/2015 07:47    Medications: I have reviewed the patient's current medications.  Assessment/Plan:   1. Weakness and functional deficits secondary to Debility.  -continue therapies 2. DVT Prophylaxis/Anticoagulation: Pharmaceutical: Lovenox 3. Pain Management: tylenol prn/percocet tolerated 4. Mood: LCSW to follow for evaluation and support.  5. Neuropsych: This patient is capable of making decisions on his own behalf. 6. Skin/Wound Care: continue local care. Occlusive dressing to trach.  7. Fluids/Electrolytes/Nutrition: encourage po/fluids. 8. CAD s/p CABG: Continue sternal precautions. Monitor for symptoms with increased activity. On ASA and statin.  9. HTN: Monitor  BP bid.  10. Acute combined systolic and diastolic CHF: Daily weights. Demadex to be increased in am. On hydralazine, demadex, sildenafil and statin.  -continue unna dressings 11. Acute on chronic respiratory failure/OSA/OHS:trach out -Continue xopenex--continue Xopenex tid and  scheduled -left pleural effusion persistent---mgt/follow up per CVTS 12. Shocked liver: follow up labs reviewed and all wnl 13. Adynamic ileus: Continue reglan---qid schedule -tolerating po diet so far -stool softener/lax 14. ABLA: Stable.  15. Complete HB: resolved. Had V Fib arrest--may need ICD v/s Life Vest at discharge? 16. A flutter: In NSR on amiodarone--no anticoagulation unless A fib/A flutter recurs.  17. Acute renal failure: Likely due to diuresis. Encourage fluids  -kdur daily,   18. Dysphagia: -on D2 diet currently without any issues     Length of stay, days: 4  Sonda Primes , MD 11/28/2015, 9:48 AM

## 2015-11-28 NOTE — Progress Notes (Signed)
Speech Language Pathology Daily Session Note  Patient Details  Name: Christopher [name] MRN: [id number] Date of Birth: 03-13-1961  Today's Date: 11/28/2015 SLP Individual Time: 2458-0998 SLP Individual Time Calculation (min): 30 min  Short Term Goals: Week 1: SLP Short Term Goal 1 (Week 1): Patient will consume current diet with minimal overt s/s of aspiration with Mod I for use of swallowing compensatory strategies. SLP Short Term Goal 2 (Week 1): Patient will perform pharyngeal strengthening exercises with Mod I.  SLP Short Term Goal 3 (Week 1): Patient will wear the PMSV during all waking hours with vitals remaining WFL and 100% intelligibility at the conversation level with Mod I.  SLP Short Term Goal 3 - Progress (Week 1): Met SLP Short Term Goal 4 (Week 1): Patient will independently donn/doff PMSV SLP Short Term Goal 4 - Progress (Week 1): Discontinued (comment) SLP Short Term Goal 5 (Week 1): Patient will participate in the MoCA to assess cognitive function.  SLP Short Term Goal 5 - Progress (Week 1): Met SLP Short Term Goal 6 (Week 1): Patient will demonstrate efficient mastication with complete  oral clearance of Dys. 3 textures without overt s/s of aspiration with Mod I for use of swallowing strategies overt 2 sessions prior to upgrade.   Skilled Therapeutic Interventions: Skilled treatment session focused on dysphagia goals. SLP facilitated session by providing skilled observation with trials of Dys. 3 textures. Patient demonstrated efficient mastication without overt s/s of aspiration and was Mod I for use of swallowing compensatory strategies.  Patient reports increased sensation of his pharynx and reported Dys. 3 textures went down "just fine." Recommend trial tray at next session prior to upgrade. Patient and wife verbalized understanding and agreement. Patient left upright in recliner with wife present. Continue with current plan of care.     Function:  Eating Eating    Modified Consistency Diet: No (with trials from SLP) Eating Assist Level: Swallowing techniques: self managed;No help, No cues           Cognition Comprehension Comprehension assist level: Follows complex conversation/direction with no assist  Expression   Expression assist level: Expresses complex ideas: With extra time/assistive device  Social Interaction Social Interaction assist level: Interacts appropriately with others with medication or extra time (anti-anxiety, antidepressant).  Problem Solving Problem solving assist level: Solves basic problems with no assist  Memory Memory assist level: More than reasonable amount of time    Pain Pain Assessment Pain Assessment: No/denies pain Pain Score: 0-No pain  Therapy/Group: Individual Therapy  Christopher [name] 11/28/2015, 12:13 PM

## 2015-11-28 NOTE — Progress Notes (Signed)
Physical Therapy Session Note  Patient Details  Name: Christopher [name] MRN: [id number] Date of Birth: [birth year]  Today's Date: 11/28/2015 PT Individual Time: 0900-1000 1300-1345 PT Individual Time Calculation (min): 60 min AND 45   Short Term Goals: Week 1:  PT Short Term Goal 1 (Week 1): LTG=STG due to short LOS  Skilled Therapeutic Interventions/Progress Updates:    Session 1  Gait training in hall for 159ft, 261ft, 275ft, 129ft, and 75 ft. Gait on uneven surface and handicap ramp for 270ft, 171ft. PT provided supervision A for all gait training with min cues for increased step width for improved stability on uneven surfaces as well as decreased gait speed on uneven surfaces to prevent LOB and increase safety.   Balance training. Standing on airex: normal BOS 2 min x 2  Narrow BOS 1 min x 2 Normal BOS eyed open/closed for 30 seconds each x 2  Alternating Toe taps on 6 inch step from airex x 15 each LE.  Toe taps on 6 inch cone 2x 12 BLE.   Biodex balance maze level 1 Trial 1: 85% with UE support Trial 2 50% without UE support Trial 3: 33% Without UE support.  For all balance training standing on Airex and biodex, PT provided min A for improved safety and to facilitate increased weight shift as needed as well as min cues for increased use of hip strategy to maintain balance.   Patient returned to room and left sitting in recliner with call bell within reach.   Session 2  Gait training in hall for 170ft x 3. PT provided supervision A for improved safety with obstacle negotiation in hall.   Balance training on Wii [employer] game x 2; trial 1: 25 trial 2: 25,  soccer x 2; Trial 1: 36, trial 2: 52.  table tilt x 2; trial 1:level 3, Trial 2: Level 4,  bubble run game x 3: trial 1: 145, Trial 2: 586 trial 3: 594 Supervision to min A provided by PT for increased safety and improved weight shift with min/mod cues for increased use of hip strategy with weight shift L and R as well  as Anterior Posterior. PT noted that patient had increased difficulty with anterior weight shift compared to posterior.   Stair training x 12 steps, 6 inches each with supervision A from PT and no UE suppoer; patient performed stair training with step over step gait pattern with mild unsteadiness with descent.  Patient returned to room and left with call bell within reach.    Therapy Documentation Precautions:  Precautions Precautions: Sternal, Fall Restrictions Weight Bearing Restrictions: No Other Position/Activity Restrictions: Sternal preacutions Pain: Pain Assessment Pain Assessment: No/denies pain Pain Score: 0-No pain  See Function Navigator for Current Functional Status.   Therapy/Group: Individual Therapy  Golden Pop 11/28/2015, 1:35 PM

## 2015-11-28 NOTE — Progress Notes (Signed)
Occupational Therapy Session Note  Patient Details  Name: SOLACE GALETTI MRN: [id number] Date of Birth: [birth year]  Today's Date: 11/28/2015 OT Individual Time: 1400-1500 OT Individual Time Calculation (min): 60 min    Short Term Goals: Week 1:  OT Short Term Goal 1 (Week 1): STG=LTG due to LOS  Skilled Therapeutic Interventions/Progress Updates:    1:1 therapeutic activities with focus on activity tolerance/ endurance to carry out ADL/IADLs at home. Pt climbed 2 flights of stairs in stair well with the rail on the right with steadying A for reassurance. Pt rested in between flights coming up and down the stairs.  These is the amt of stairs to access his apartment. Pt participated in balance and endurance task performing stepping on the Kinetron in standing. Pt performed 3-4 times without UE support but only able to tolerate it for less than a min before requiring a seated rest break. Pt engaged in Wii bowling while standing in [place] (with equal weight through feet). Pt able to stand for entire game. Pt HR remained in the 70s. Pt perform 5 min exercise on the Kinetron with one brief rest break. Pt ambulated from activity to activity with supervision.   Therapy Documentation Precautions:  Precautions Precautions: Sternal, Fall Restrictions Weight Bearing Restrictions: No Other Position/Activity Restrictions: Sternal preacutions Pain:  no c/o pain   See Function Navigator for Current Functional Status.   Therapy/Group: Individual Therapy  Roney Mans [facility] 11/28/2015, 3:59 PM

## 2015-11-29 ENCOUNTER — Inpatient Hospital Stay (HOSPITAL_COMMUNITY): Payer: BLUE CROSS/BLUE SHIELD | Admitting: Physical Therapy

## 2015-11-29 NOTE — Progress Notes (Signed)
Christopher [name] is a 55 y.o. male Aug 04, 1961 [id number]  Subjective: C/o nausea from Reglan. Slept well. Feeling OK.  Objective: Vital signs in last 24 hours: Temp:  [97.5 F (36.4 C)-98.4 F (36.9 C)] 98.4 F (36.9 C) (04/23 0540) Pulse Rate:  [69-73] 72 (04/23 0540) Resp:  [18] 18 (04/23 0540) BP: (103-119)/(63-71) 113/68 mmHg (04/23 0540) SpO2:  [97 %] 97 % (04/23 0540) Weight:  [214 lb 4.6 oz (97.2 kg)] 214 lb 4.6 oz (97.2 kg) (04/23 0540) Weight change: -1 lb 15.8 oz (-0.9 kg) Last BM Date: 11/28/15 (per report)  Intake/Output from previous day: 04/22 0701 - 04/23 0700 In: 480 [P.O.:480] Out: 2800 [Urine:2800] Last cbgs: CBG (last 3)  No results for input(s): GLUCAP in the last 72 hours.   Physical Exam General: NAD HEENT: not dry Lungs: Normal effort. Lungs clear to auscultation, no crackles or wheezes. Cardiovascular: Regular rate and rhythm, no edema Abdomen: S/NT/ND; BS(+) Musculoskeletal:  unchanged Neurological: No new neurological deficits Wounds: tracheostomy dressed; chest scar ok    Skin: clear   Mental state: Alert, oriented, cooperative    Lab Results: BMET    Component Value Date/Time   NA 134* 11/28/2015 0454   K 2.9* 11/28/2015 0454   CL 94* 11/28/2015 0454   CO2 28 11/28/2015 0454   GLUCOSE 92 11/28/2015 0454   BUN 27* 11/28/2015 0454   CREATININE 1.74* 11/28/2015 0454   CALCIUM 8.4* 11/28/2015 0454   GFRNONAA 43* 11/28/2015 0454   GFRAA 50* 11/28/2015 0454   CBC    Component Value Date/Time   WBC 6.5 11/26/2015 0553   RBC 3.79* 11/26/2015 0553   HGB 10.4* 11/26/2015 0553   HCT 33.9* 11/26/2015 0553   PLT 202 11/26/2015 0553   MCV 89.4 11/26/2015 0553   MCH 27.4 11/26/2015 0553   MCHC 30.7 11/26/2015 0553   RDW 17.3* 11/26/2015 0553   LYMPHSABS 1.0 11/25/2015 0442   MONOABS 0.5 11/25/2015 0442   EOSABS 0.3 11/25/2015 0442   BASOSABS 0.0 11/25/2015 0442    Studies/Results: Dg Chest 2 View  11/28/2015  CLINICAL DATA:   Followup left pleural effusion and left lung base consolidation. EXAM: CHEST  2 VIEW COMPARISON:  11/24/2015 FINDINGS: Left pleural effusion is without significant change from the prior study. Associated parenchymal opacity is slightly improved. No new lung abnormalities. No right pleural effusion. No pneumothorax. Changes from CABG surgery are stable. Cardiac silhouette is borderline enlarged. No mediastinal widening or mediastinal or hilar masses. Right PICC and right internal jugular central venous line as well as tracheostomy tube and enteric tube have been removed since the prior study. IMPRESSION: 1. All lines and tubes have been removed. 2. Slight improvement in left lung base aeration. There is significant residual opacity reflecting a moderate effusion and either pneumonia or atelectasis or a combination. Electronically Signed   By: Amie Portland M.D.   On: 11/28/2015 07:47    Medications: I have reviewed the patient's current medications.  Assessment/Plan:   1. Weakness and functional deficits secondary to Debility.  -continue therapies 2. DVT Prophylaxis/Anticoagulation: Pharmaceutical: Lovenox 3. Pain Management: tylenol prn/percocet tolerated 4. Mood: LCSW to follow for evaluation and support.  5. Neuropsych: This patient is capable of making decisions on his own behalf. 6. Skin/Wound Care: continue local care. Occlusive dressing to trach.  7. Fluids/Electrolytes/Nutrition: encourage po/fluids. 8. CAD s/p CABG: Continue sternal precautions. Monitor for symptoms with increased activity. On ASA and statin.  9. HTN: Monitor BP bid.  10. Acute combined  systolic and diastolic CHF: Daily weights. Demadex to be increased in am. On hydralazine, demadex, sildenafil and statin.  -continue unna dressings 11. Acute on chronic respiratory failure/OSA/OHS:trach out -Continue xopenex--continue Xopenex tid and scheduled -left pleural  effusion persistent---mgt/follow up per CVTS 12. Shocked liver: follow up labs reviewed and all wnl 13. Adynamic ileus: Continue reglan---qid schedule -tolerating po diet so far -stool softener/lax 14. ABLA: Stable.  15. Complete HB: resolved. Had V Fib arrest--may need ICD v/s Life Vest at discharge? 16. A flutter: In NSR on amiodarone--no anticoagulation unless A fib/A flutter recurs.  17. Acute renal failure: Likely due to diuresis. Encourage fluids  -kdur daily,  18. Dysphagia: -on D2 diet currently without any issues 19 Nausea from Reglan per pt - will d/c     Length of stay, days: 5  Sonda Primes , MD 11/29/2015, 8:15 AM

## 2015-11-29 NOTE — Progress Notes (Signed)
Physical Therapy Session Note  Patient Details  Name: Christopher [name] MRN: [id number] Date of Birth: [birth year]  Today's Date: 11/29/2015 PT Individual Time: 1345-1415 PT Individual Time Calculation (min): 30 min   Short Term Goals: Week 1:  PT Short Term Goal 1 (Week 1): LTG=STG due to short LOS  Skilled Therapeutic Interventions/Progress Updates:    Pt received seated in recliner with SO present; no c/o pain but does state "I feel like crap today", reports some fatigue, feeling "out of it". Gait to/from gym 2x150' with S, occasional staggering however recovers without A. Standing balance in parallel bars on rocker board medial/lateral and anterior/posterior, and upside down bosu while performing ball toss with SO; min guard for balance and occasional use of UEs on bars for stability. Reports "I could do better if I was feeling myself today". Declines community mobility down to first floor and practicing stairs due to not feeling well. Declines asking RN for medication. Nustep x9 min with BUE/BLE for strengthening and aerobic endurance. Remained seated in recliner with SO present, all needs within reach at completion of session.   Therapy Documentation Precautions:  Precautions Precautions: Sternal, Fall Restrictions Weight Bearing Restrictions: No Other Position/Activity Restrictions: Sternal preacutions General:   Pain: Pain Assessment Pain Assessment: No/denies pain   See Function Navigator for Current Functional Status.   Therapy/Group: Individual Therapy  Vista Lawman 11/29/2015, 2:42 PM

## 2015-11-30 ENCOUNTER — Inpatient Hospital Stay (HOSPITAL_COMMUNITY): Payer: BLUE CROSS/BLUE SHIELD | Admitting: Occupational Therapy

## 2015-11-30 ENCOUNTER — Inpatient Hospital Stay (HOSPITAL_COMMUNITY): Payer: BLUE CROSS/BLUE SHIELD | Admitting: Physical Therapy

## 2015-11-30 ENCOUNTER — Inpatient Hospital Stay (HOSPITAL_COMMUNITY): Payer: BLUE CROSS/BLUE SHIELD | Admitting: Speech Pathology

## 2015-11-30 ENCOUNTER — Inpatient Hospital Stay (HOSPITAL_COMMUNITY): Payer: BLUE CROSS/BLUE SHIELD

## 2015-11-30 LAB — BASIC METABOLIC PANEL
ANION GAP: 15 (ref 5–15)
BUN: 16 mg/dL (ref 6–20)
CALCIUM: 8.6 mg/dL — AB (ref 8.9–10.3)
CHLORIDE: 93 mmol/L — AB (ref 101–111)
CO2: 26 mmol/L (ref 22–32)
Creatinine, Ser: 1.66 mg/dL — ABNORMAL HIGH (ref 0.61–1.24)
GFR calc non Af Amer: 45 mL/min — ABNORMAL LOW (ref >60)
GFR, EST AFRICAN AMERICAN: 52 mL/min — AB (ref >60)
GLUCOSE: 98 mg/dL (ref 65–99)
POTASSIUM: 3.2 mmol/L — AB (ref 3.5–5.1)
Sodium: 134 mmol/L — ABNORMAL LOW (ref 135–145)

## 2015-11-30 MED ORDER — PANTOPRAZOLE SODIUM 40 MG PO TBEC
40.0000 mg | DELAYED_RELEASE_TABLET | Freq: Every day | ORAL | Status: DC
  Administered 2015-12-01: 40 mg via ORAL
  Filled 2015-11-30: qty 1

## 2015-11-30 MED ORDER — POTASSIUM CHLORIDE CRYS ER 20 MEQ PO TBCR
40.0000 meq | EXTENDED_RELEASE_TABLET | Freq: Every day | ORAL | Status: DC
  Administered 2015-12-01: 40 meq via ORAL
  Filled 2015-11-30: qty 2

## 2015-11-30 MED ORDER — POTASSIUM CHLORIDE CRYS ER 20 MEQ PO TBCR
40.0000 meq | EXTENDED_RELEASE_TABLET | Freq: Once | ORAL | Status: AC
  Administered 2015-11-30: 40 meq via ORAL
  Filled 2015-11-30: qty 2

## 2015-11-30 MED ORDER — SPIRONOLACTONE 25 MG PO TABS
12.5000 mg | ORAL_TABLET | Freq: Once | ORAL | Status: AC
  Administered 2015-11-30: 12.5 mg via ORAL
  Filled 2015-11-30: qty 1

## 2015-11-30 MED ORDER — SPIRONOLACTONE 25 MG PO TABS
25.0000 mg | ORAL_TABLET | Freq: Every day | ORAL | Status: DC
  Administered 2015-12-01: 25 mg via ORAL
  Filled 2015-11-30: qty 1

## 2015-11-30 NOTE — Progress Notes (Signed)
Recreational Therapy Session Note  Patient Details  Name: Christopher [name] MRN: [id number] Date of Birth: 12-31-1960 Today's Date: 11/30/2015   Full eval deferred as team pt is scheduled for discharge home in the next couple of days. Christopher [name] 11/30/2015, 4:04 PM

## 2015-11-30 NOTE — Progress Notes (Signed)
[place] PHYSICAL MEDICINE & REHABILITATION     PROGRESS NOTE    Subjective/Complaints: Didn't sleep well. "was interrupted". Otherwise feeling well. Breathing without issue  ROS: Pt denies fever, rash/itching, headache, blurred or double vision, nausea, vomiting, abdominal pain, diarrhea,   shortness of breath, palpitations, dysuria, dizziness, neck or back pain, bleeding, anxiety, or depression   Objective: Vital Signs: Blood pressure 120/68, pulse 72, temperature 97.8 F (36.6 C), temperature source Oral, resp. rate 18, height  (1.778 m), weight 98.431 kg (217 lb), SpO2 97 %. No results found. No results for input(s): WBC, HGB, HCT, PLT in the last 72 hours.  Recent Labs  11/28/15 0454 11/30/15 0551  NA 134* 134*  K 2.9* 3.2*  CL 94* 93*  GLUCOSE 92 98  BUN 27* 16  CREATININE 1.74* 1.66*  CALCIUM 8.4* 8.6*   CBG (last 3)  No results for input(s): GLUCAP in the last 72 hours.  Wt Readings from Last 3 Encounters:  11/30/15 98.431 kg (217 lb)  11/24/15 103.7 kg (228 lb 9.9 oz)  10/20/15 116.121 kg (256 lb)    Physical Exam:  Constitutional: He is oriented to person, place, and time. He appears well-developed and well-nourished.  Trach with PMSV and strong voice noted.  HENT:  Head: Normocephalic and atraumatic.  Mouth/Throat: Oropharynx is clear and moist.  Eyes: Conjunctivae are normal. Pupils are equal, round, and reactive to light.  Neck: Neck supple. Trach stoma closed. No drainage. Cardiovascular: Normal rate and regular rhythm. Exam reveals distant heart sounds.  Respiratory: Effort normal. No respiratory distress. He has decreased breath sounds in the left   lower field. He has no wheezes. He has no rhonchi.  GI: He exhibits less distension. Bowel sounds are increased. There is no tenderness. There is no rebound.   Musculoskeletal: He exhibits edema.  BLE with edema tibially and 2+ pedally with unna boots in place.  Neurological: He is alert  and oriented to person, place, and time.  Wet voice but speech clear. Follows commands without difficulty. UE 3-4/5 prox to distal. LE: 3/5 HF, 3+ KE and 4/5 ADF/PF. No gross sensory deficits. Cognitively appropriate with reasonable insight and awareness.  Skin: Skin is warm and dry.  Sternal incision clean and dry with scabs. Rash on left buttocks--drug eruption per wife. Irritated area intergluteal cleft. Right leg donor site with staples and intact  Psychiatric: He has a normal mood and affect. His behavior is irritable, impulsive   Assessment/Plan: 1. Weakness, dysphagia, and functional deficits secondary to multiple medical issues which require 3+ hours per day of interdisciplinary therapy in a comprehensive inpatient rehab setting. Physiatrist is providing close team supervision and 24 hour management of active medical problems listed below. Physiatrist and rehab team continue to assess barriers to discharge/monitor patient progress toward functional and medical goals.  Function:  Bathing Bathing position   Position: Shower  Bathing parts Body parts bathed by patient: Right arm, Left arm, Chest, Abdomen, Front perineal area, Right upper leg, Left upper leg Body parts bathed by helper: Buttocks, Right lower leg, Left lower leg, Back  Bathing assist Assist Level:  (patient completed 70%, moderate assist)      Upper Body Dressing/Undressing Upper body dressing   What is the patient wearing?: Pull over shirt/dress     Pull over shirt/dress - Perfomed by patient: Thread/unthread right sleeve, Pull shirt over trunk, Thread/unthread left sleeve, Put head through opening          Upper body assist Assist  Level: Set up   Set up : To obtain clothing/put away  Lower Body Dressing/Undressing Lower body dressing   What is the patient wearing?: Underwear, Pants, Non-skid slipper socks Underwear - Performed by patient: Thread/unthread right underwear leg, Thread/unthread left  underwear leg, Pull underwear up/down   Pants- Performed by patient: Thread/unthread right pants leg, Thread/unthread left pants leg, Pull pants up/down   Non-skid slipper socks- Performed by patient: Don/doff right sock, Don/doff left sock                    Lower body assist Assist for lower body dressing: Supervision or verbal cues      Toileting Toileting   Toileting steps completed by patient: Adjust clothing prior to toileting, Performs perineal hygiene, Adjust clothing after toileting   Toileting Assistive Devices: Grab bar or rail  Toileting assist Assist level: Supervision or verbal cues   Transfers Chair/bed transfer   Chair/bed transfer method: Ambulatory Chair/bed transfer assist level: No help, no cues Chair/bed transfer assistive device: Armrests     Locomotion Ambulation     Max distance: 210 Assist level: Supervision or verbal cues   Wheelchair Wheelchair activity did not occur: N/A        Cognition Comprehension Comprehension assist level: Follows complex conversation/direction with no assist  Expression Expression assist level: Expresses complex ideas: With extra time/assistive device  Social Interaction Social Interaction assist level: Interacts appropriately with others with medication or extra time (anti-anxiety, antidepressant).  Problem Solving Problem solving assist level: Solves basic problems with no assist  Memory Memory assist level: More than reasonable amount of time   Medical Problem List and Plan: 1. Weakness and functional deficits secondary to Debility.   -continue therapies  -pt appears to be moving well. Will d/w team/surgery regarding potential dc over next couple days 2. DVT Prophylaxis/Anticoagulation: Pharmaceutical: Lovenox indicated 3. Pain Management: tylenol prn/percocet tolerated 4. Mood: LCSW to follow for evaluation and support.  5. Neuropsych: This patient is capable of making decisions on his own behalf. 6.  Skin/Wound Care: continue local care. Occlusive dressing to trach.  7. Fluids/Electrolytes/Nutrition: encourage po/fluids. 8. CAD s/p CABG: Continue sternal precautions. Monitor for symptoms with increased activity. On ASA and statin.  9. HTN: Monitor BP bid.  10. Acute combined systolic and diastolic CHF: Daily weights. Demadex to be increased in am. On hydralazine, demadex, sildenafil and statin.   -  unna dressings 11. Acute on chronic respiratory failure/OSA/OHS:trach out  -Continue xopenex--continue Xopenex tid and scheduled  -left pleural effusion appears stable===mgt per  CVTS 12. Shocked liver: follow up labs reviewed and all wnl 13. Adynamic ileus: reglan dc'ed due to "nausea"  -tolerating po diet so far  -stool softener/lax 14. ABLA: Stable.  15. Complete HB: resolved. Had V Fib arrest--may need ICD v/s Life Vest at discharge? 16. A flutter: In NSR on amiodarone--no anticoagulation unless A fib/A flutter recurs.  17. Acute renal failure: Likely due to diuresis.  Encourage fluids   -kdur daily, .  -continue to supplement  3.2 today    18. Dysphagia:  -on D3 diet currently without any issues    LOS (Days) 6 A FACE TO FACE EVALUATION WAS PERFORMED  SWARTZ,ZACHARY T 11/30/2015 9:10 AM

## 2015-11-30 NOTE — Progress Notes (Signed)
CXR reviewed with Dr. Donata Clay. No need for [place] thoracocentesis at this time.

## 2015-11-30 NOTE — Progress Notes (Signed)
Patient ID: Christopher [name], male   DOB: 10-14-1960, 55 y.o.   MRN: [id number]    SUBJECTIVE:   Lifevest form completed and faxed by [facility] staff today. Dennis Bast, RN of [employer] aware.  Fitting scheduled for tomorrow.   Tracheostomy out 11/26/15. Looks and feels great.  R ankle is a little swollen, but otherwise no complaints, would just like to go home. Denies CP, SOB, lightheadedness, or dizziness.   Scheduled Meds: . amiodarone  200 mg Oral Daily  . antiseptic oral rinse  7 mL Mouth Rinse q12n4p  . aspirin  81 mg Oral Daily  . atorvastatin  80 mg Oral q1800  . carvedilol  6.25 mg Oral BID WC  . chlorhexidine  15 mL Mouth Rinse BID  . enoxaparin (LOVENOX) injection  40 mg Subcutaneous Q24H  . feeding supplement (ENSURE ENLIVE)  237 mL Oral Q1500  . hydrALAZINE  25 mg Oral Q8H  . [START ON 12/01/2015] pantoprazole  40 mg Oral Daily  . [START ON 12/01/2015] potassium chloride  40 mEq Oral Daily  . QUEtiapine  50 mg Oral QHS  . senna-docusate  1 tablet Per Tube QHS  . sildenafil  40 mg Oral TID  . spironolactone  12.5 mg Oral Daily  . torsemide  60 mg Oral Daily   Continuous Infusions:   PRN Meds:.acetaminophen, alum & mag hydroxide-simeth, bisacodyl, diphenhydrAMINE, guaiFENesin-dextromethorphan, levalbuterol, ondansetron (ZOFRAN) IV, oxyCODONE-acetaminophen **AND** oxyCODONE, prochlorperazine **OR** prochlorperazine **OR** prochlorperazine, sodium chloride, sodium phosphate, traMADol, traZODone    Filed Vitals:   11/28/15 2103 11/29/15 0540 11/29/15 1426 11/30/15 0443  BP: 103/63 113/68 118/75 120/68  Pulse: 69 72 70 72  Temp:  98.4 F (36.9 C) 97.9 F (36.6 C) 97.8 F (36.6 C)  TempSrc:   Oral Oral  Resp: 18 18 18 18   Height:      Weight:  214 lb 4.6 oz (97.2 kg)  217 lb (98.431 kg)  SpO2:  97% 97% 97%    Intake/Output Summary (Last 24 hours) at 11/30/15 0932 Last data filed at 11/30/15 0025  Gross per 24 hour  Intake    480 ml  Output   2251 ml  Net  -1771 ml     LABS: Basic Metabolic Panel:  Recent Labs  74/25/95 0454 11/30/15 0551  NA 134* 134*  K 2.9* 3.2*  CL 94* 93*  CO2 28 26  GLUCOSE 92 98  BUN 27* 16  CREATININE 1.74* 1.66*  CALCIUM 8.4* 8.6*   Liver Function Tests: No results for input(s): AST, ALT, ALKPHOS, BILITOT, PROT, ALBUMIN in the last 72 hours. No results for input(s): LIPASE, AMYLASE in the last 72 hours. CBC: No results for input(s): WBC, NEUTROABS, HGB, HCT, MCV, PLT in the last 72 hours. Cardiac Enzymes: No results for input(s): CKTOTAL, CKMB, CKMBINDEX, TROPONINI in the last 72 hours. BNP: Invalid input(s): POCBNP D-Dimer: No results for input(s): DDIMER in the last 72 hours. Hemoglobin A1C: No results for input(s): HGBA1C in the last 72 hours. Fasting Lipid Panel: No results for input(s): CHOL, HDL, LDLCALC, TRIG, CHOLHDL, LDLDIRECT in the last 72 hours. Thyroid Function Tests: No results for input(s): TSH, T4TOTAL, T3FREE, THYROIDAB in the last 72 hours.  Invalid input(s): FREET3 Anemia Panel: No results for input(s): VITAMINB12, FOLATE, FERRITIN, TIBC, IRON, RETICCTPCT in the last 72 hours.   PHYSICAL EXAM General: NAD. Sitting in the chair.  Neck: Thick, JVP difficult to assess. Tracheostomy.  Lungs: Decreased breath sounds left base.  CV: Nondisplaced PMI.  Heart  regular S1/S2, no S3/S4, no murmur.  Legs wrapped. 1+ edema above wraps.     Abdomen: Soft, nontender, no hepatosplenomegaly, no distention.  Neurologic: Alert and oriented x 3.  Psych: Normal affect. Extremities: No clubbing or cyanosis.    ASSESSMENT AND PLAN:  1. CAD: S/p late presentation inferior MI and CABG x 4. Continue statin, ASA. Coronary angiography 3/27 with multiple VT episodes showed patent LIMA-LAD and SVG-RCA. Patent SVG-ramus and SVG-D but slow flow in both grafts with diffusely diseased small target vessels.  - Continue ASA 81 and statin.  2. Cardiogenic shock: Acute systolic CHF with RV failure post inferior  MI with suspected RV involvement. EF 25-30% on echo (difficult images on 3/24 echo). He has significant RV failure from suspected RV infarct. 3/27 IABP placed to try to allow decrease in milrinone with frequent VT. 3/31 echo with EF 30-35%. IABP out 4/2. Milrinone off 4/7. Volume status improved. - Continue torsemide 60 mg daily. Creatinine stable to improved. K 3.2 this am. Will supp.  - Can wear compression stockings instead of Unna boots. - Increase spironolactone to 25 mg daily.  - Continue Coreg 6.25 mg bid.   - Continue sildenafil 40 tid.  - Continue hydralazine 25 mg tid for afterload reduction (no Imdur with sildenafil use).  3. Complete heart block: Resolved.  4. VF arrest 3/24 early am and again at night 3/24.  - Continue po amio 200 mg daily. - Lifevest ordered, scheduled for fitting tomorrow. Plan to repeat ECHO in a couple of months for ICD evaluation.  5. AKI: Creatinine stable, follow carefully.   6. ID: Possible LLL PNA. He completed course of vancomycin/levofloxacin.  7. Pulmonary: S/P Trach 4/5 on trach collar. [name] now out. 8. Dysphagia: Passed swallow study.  9. Atrial flutter:  Regular pulse. He was off AC due to trach bleeding. On DVT prophylaxis. Will not start coumadin unless AF/AFL recurs. 10. Deconditioning: continue PT. 11. Hypokalemia - Increase K supp today.  - Increase spiro as above.   Dispo per CIR team.  Stable from HF standpoint once Lifevest fitted tomorrow.   Graciella Freer PA-C 11/30/2015 9:32 AM  Advanced Heart Failure Team Pager [phone] (M-F; 7a - 4p)  Please contact [facility] for night-coverage after hours (4p -7a ) and weekends on [email or web address]  Patient seen with PA, agree with the above note.  Replete K.  Can increase spironolactone to 25 daily and add losartan 12.5 mg daily as creatinine has been stable.   [name] 11/30/2015 2:00 PM

## 2015-11-30 NOTE — Progress Notes (Signed)
Physical Therapy Session Note  Patient Details  Name: Christopher [name] MRN: [id number] Date of Birth: 1961-06-25  Today's Date: 11/30/2015 PT Individual Time: 0800-0900 PT Individual Time Calculation (min): 60 min   Short Term Goals: Week 1:  PT Short Term Goal 1 (Week 1): LTG=STG due to short LOS  Skilled Therapeutic Interventions/Progress Updates:    Session focused on d/c planning, functional gait on unit and over carpeted surfaces in ADL apartment to simulate home mobility at overall supervision level, simulated car transfer mod I, ramp and uneven surface negotiation including curb step at supervision level, bed mobility in ADL apartment independently to simulate home environment, stair negotiation training for home entry practice without use of rails, re-administered Berg Balance test (see results below) and discussed results with pt and wife who verbalized understanding, dynamic gait and balance activities for neuro re-ed while holding board with stacked cups during gait (normal and retro, and sit <> stands x 10 reps), and Nustep for overall cardiovascular endurance and strengthening x 8 min (6 min , rest, and 2 min) on level 5.   Pt performing basic transfers at modified independent level but still supervision for longer distance gait due to mild LOB occurring at times especially as he fatigues - no physical A by PT needed though but present for safety.   Therapy Documentation Precautions:  Precautions Precautions: Sternal, Fall Restrictions Weight Bearing Restrictions: No Other Position/Activity Restrictions: Sternal preacutions  Pain: Denies pain; c/o fatigue.   Balance: Standardized Balance Assessment Standardized Balance Assessment: Berg Balance Test Berg Balance Test Sit to Stand: Able to stand without using hands and stabilize independently Standing Unsupported: Able to stand safely 2 minutes Sitting with Back Unsupported but Feet Supported on Floor or Stool: Able to  sit safely and securely 2 minutes Stand to Sit: Sits safely with minimal use of hands Transfers: Able to transfer safely, minor use of hands Standing Unsupported with Eyes Closed: Able to stand 10 seconds safely Standing Ubsupported with Feet Together: Able to place feet together independently and stand 1 minute safely From Standing, Reach Forward with Outstretched Arm: Can reach confidently >25 cm (10") From Standing Position, Pick up Object from Floor: Able to pick up shoe safely and easily From Standing Position, Turn to Look Behind Over each Shoulder: Looks behind from both sides and weight shifts well Turn 360 Degrees: Able to turn 360 degrees safely one side only in 4 seconds or less Standing Unsupported, Alternately Place Feet on Step/Stool: Able to stand independently and safely and complete 8 steps in 20 seconds Standing Unsupported, One Foot in Front: Able to place foot tandem independently and hold 30 seconds Standing on One Leg: Able to lift leg independently and hold 5-10 seconds Total Score: 54   See Function Navigator for Current Functional Status.   Therapy/Group: Individual Therapy  Karolee Stamps Darrol Poke, PT, DPT  11/30/2015, 10:17 AM

## 2015-11-30 NOTE — Progress Notes (Signed)
   [employer] Vest form completed and faxed by [facility] staff today.   I contacted Dennis Bast RN, [employer] engineer today. She plans to fit Technical sales engineer tomorrow.   Maryalyce Sanjuan NP-C  9:31 AM

## 2015-11-30 NOTE — Progress Notes (Signed)
Speech Language Pathology Daily Session Note  Patient Details  Name: NESTA KIMPLE MRN: [id number] Date of Birth: Jan 04, 1961  Today's Date: 11/30/2015 SLP Individual Time: 1204-1230 SLP Individual Time Calculation (min): 26 min  Short Term Goals: Week 1: SLP Short Term Goal 1 (Week 1): Patient will consume regular textures and thin liquids with minimal overt s/s of aspiration with Mod I for use of swallowing compensatory strategies. SLP Short Term Goal 2 (Week 1): Patient will perform pharyngeal strengthening exercises with Mod I.  SLP Short Term Goal 3 (Week 1): Patient will wear the PMSV during all waking hours with vitals remaining WFL and 100% intelligibility at the conversation level with Mod I.  SLP Short Term Goal 3 - Progress (Week 1): Met SLP Short Term Goal 4 (Week 1): Patient will independently donn/doff PMSV SLP Short Term Goal 4 - Progress (Week 1): Discontinued (comment) SLP Short Term Goal 5 (Week 1): Patient will participate in the MoCA to assess cognitive function.  SLP Short Term Goal 5 - Progress (Week 1): Met SLP Short Term Goal 6 (Week 1): Patient will demonstrate efficient mastication with complete  oral clearance of Dys. 3 textures without overt s/s of aspiration with Mod I for use of swallowing strategies overt 2 sessions prior to upgrade.  SLP Short Term Goal 6 - Progress (Week 1): Met  Skilled Therapeutic Interventions: Skilled treatment session focused on addressing dysphagia goals. SLP facilitated session by providing skilled observation of advanced, Dys.3 textures with thin liquids.  Patient utilized safe swallow strategies Mod I throughout session with no overt s/s of aspiration.  Recommend to continue with advanced textures, meds whole in puree and Intermittent supervision.    Function:  Eating Eating   Modified Consistency Diet: Yes Eating Assist Level: No help, No cues;Swallowing techniques: self managed           Cognition Comprehension  Comprehension assist level: Follows complex conversation/direction with no assist  Expression   Expression assist level: Expresses complex ideas: With extra time/assistive device  Social Interaction Social Interaction assist level: Interacts appropriately with others with medication or extra time (anti-anxiety, antidepressant).  Problem Solving Problem solving assist level: Solves basic problems with no assist  Memory Memory assist level: More than reasonable amount of time    Pain Pain Assessment Pain Assessment: No/denies pain Pain Score: 0-No pain  Therapy/Group: Individual Therapy  Carmelia Roller., [place] [phone]  [place] 11/30/2015, 12:20 PM

## 2015-11-30 NOTE — Progress Notes (Signed)
Occupational Therapy Session Note  Patient Details  Name: Christopher [name] MRN: [id number] Date of Birth: [birth year]  Today's Date: 11/30/2015 OT Individual Time: 1300-1345 OT Individual Time Calculation (min): 45 min    Short Term Goals: Week 1:  OT Short Term Goal 1 (Week 1): STG=LTG due to LOS  Skilled Therapeutic Interventions/Progress Updates:  Upon entering the room, pt seated in recliner chair with friend present in room. Pt with no c/o pain this session. Pt ambulating in room without use of AD and mod I. Pt ambulated onto elevator and to gift shop with supervision secondary to fatigue. Pt ambulating in gift shop without AD on carpeted surface, down aisle, and reaching downward to obtain items from low shelves in squat position with supervision. Pt able to navigate self back onto elevator and to rehab floor. Pt practiced stepping into and over tub ledge with supervision  as he report he does not want shower equipment. OT expressed concern for energy conservation but pt states, "I will buy it if I determine I need it at home." Pt returning to room where therapist provided pt with paper handout regarding energy conservation for self care, general principles, and IADL tasks. Pt verbalized understanding. Pt seated in recliner chair with call bell and all needed items within reach upon exiting the room.   Therapy Documentatio Precautions:  Precautions Precautions: Sternal, Fall Restrictions Weight Bearing Restrictions: No Other Position/Activity Restrictions: Sternal preacutions Vital Signs: Therapy Vitals Temp: 97.7 F (36.5 C) Temp Source: Oral Pulse Rate: 71 Resp: 18 BP: 107/69 mmHg Patient Position (if appropriate): Sitting Oxygen Therapy SpO2: 98 % O2 Device: Not Delivered Pain: Pain Assessment Pain Assessment: No/denies pain  See Function Navigator for Current Functional Status.   Therapy/Group: Individual Therapy  Lowella Grip 11/30/2015, 3:57 PM

## 2015-11-30 NOTE — Progress Notes (Signed)
Pt d/c'd to CIR before education done with [facility]. Discussed low sodium, DM diet, daily wts, walking at home, and CRPII with pt. Very receptive and eager. Interested in [facility] and will send referral to [facility] however he will need an order written by MD since he is technically d/c'd from our services. Gave pt and girlfriend videos to watch on HF and I will drop off HF booklet tomorrow. Ethelda Chick CES, ACSM 2:57 PM 11/30/2015

## 2015-11-30 NOTE — Progress Notes (Signed)
Physical Therapy Session Note  Patient Details  Name: Christopher [name] MRN: [id number] Date of Birth: 11-20-1960  Today's Date: 11/30/2015 PT Individual Time: 1000-1055 PT Individual Time Calculation (min): 55 min   Short Term Goals: Week 1:  PT Short Term Goal 1 (Week 1): LTG=STG due to short LOS  Skilled Therapeutic Interventions/Progress Updates:    Pt received seated in recliner, denies pain and agreeable to treatment despite change in schedule with one hour break since last session. Gait to gym x150' with S. Performed seated LE strengthening exercises 2x10 reps each with 1.5# weights on BLE. Unable to complete second set of sit <>stands due to fatigue, pt with mild verbal agitation reporting "I'm not sleeping at night and I had a session already this morning, you should understand that a person needs rest and I need to pace myself during the day". Educated pt regarding gradual increase in activity to improve endurance and reduce lethargy during the day, as well as to assist with sleeping at night. Standing dynamic balance while engaged in Wii bowling for balance, coordination, standing tolerance/endurance, min verbal cues and 2 instances of min guard for balance due to mild LOB while "bowling". One rest break due to fatigue during trial. Gait in hall 2x300' with S overall. Discussed with primary PT, and pt made mod I in his room at this time with education regarding safety in room, and use of call bell if he is overly fatigued or feels he may need assistance. Pt and SO agreeable. Remained seated in recliner at completion of session, all needs within reach.   Therapy Documentation Precautions:  Precautions Precautions: Sternal, Fall Restrictions Weight Bearing Restrictions: No Other Position/Activity Restrictions: Sternal preacutions Pain: Pain Assessment Pain Assessment: No/denies pain Pain Score: 0-No pain   See Function Navigator for Current Functional Status.   Therapy/Group:  Individual Therapy  Vista Lawman 11/30/2015, 11:01 AM

## 2015-12-01 ENCOUNTER — Inpatient Hospital Stay (HOSPITAL_COMMUNITY): Payer: BLUE CROSS/BLUE SHIELD | Admitting: Speech Pathology

## 2015-12-01 ENCOUNTER — Inpatient Hospital Stay (HOSPITAL_COMMUNITY): Payer: BLUE CROSS/BLUE SHIELD | Admitting: Occupational Therapy

## 2015-12-01 ENCOUNTER — Inpatient Hospital Stay (HOSPITAL_COMMUNITY): Payer: BLUE CROSS/BLUE SHIELD

## 2015-12-01 DIAGNOSIS — E876 Hypokalemia: Secondary | ICD-10-CM | POA: Diagnosis not present

## 2015-12-01 LAB — BASIC METABOLIC PANEL
Anion gap: 10 (ref 5–15)
BUN: 14 mg/dL (ref 6–20)
CHLORIDE: 96 mmol/L — AB (ref 101–111)
CO2: 29 mmol/L (ref 22–32)
Calcium: 8.8 mg/dL — ABNORMAL LOW (ref 8.9–10.3)
Creatinine, Ser: 1.6 mg/dL — ABNORMAL HIGH (ref 0.61–1.24)
GFR calc Af Amer: 55 mL/min — ABNORMAL LOW (ref >60)
GFR calc non Af Amer: 47 mL/min — ABNORMAL LOW (ref >60)
Glucose, Bld: 109 mg/dL — ABNORMAL HIGH (ref 65–99)
POTASSIUM: 3.4 mmol/L — AB (ref 3.5–5.1)
SODIUM: 135 mmol/L (ref 135–145)

## 2015-12-01 LAB — MAGNESIUM: MAGNESIUM: 1.7 mg/dL (ref 1.7–2.4)

## 2015-12-01 MED ORDER — POTASSIUM CHLORIDE CRYS ER 20 MEQ PO TBCR
20.0000 meq | EXTENDED_RELEASE_TABLET | Freq: Every day | ORAL | Status: DC
Start: 2015-12-02 — End: 2015-12-01

## 2015-12-01 MED ORDER — TORSEMIDE 20 MG PO TABS: 60.0000 mg | ORAL_TABLET | Freq: Every day | ORAL | Status: DC

## 2015-12-01 MED ORDER — SPIRONOLACTONE 25 MG PO TABS
25.0000 mg | ORAL_TABLET | Freq: Every day | ORAL | Status: DC
Start: 2015-12-01 — End: 2015-12-30

## 2015-12-01 MED ORDER — QUETIAPINE FUMARATE 50 MG PO TABS: 25.0000 mg | ORAL_TABLET | Freq: Every day | ORAL | Status: DC

## 2015-12-01 MED ORDER — POTASSIUM CHLORIDE CRYS ER 20 MEQ PO TBCR
EXTENDED_RELEASE_TABLET | ORAL | Status: DC
Start: 2015-12-01 — End: 2015-12-10

## 2015-12-01 MED ORDER — SILDENAFIL CITRATE 20 MG PO TABS: 40.0000 mg | ORAL_TABLET | Freq: Three times a day (TID) | ORAL | Status: DC

## 2015-12-01 MED ORDER — HYDRALAZINE HCL 25 MG PO TABS: 25.0000 mg | ORAL_TABLET | Freq: Three times a day (TID) | ORAL | Status: DC

## 2015-12-01 MED ORDER — ATORVASTATIN CALCIUM 80 MG PO TABS: 80.0000 mg | ORAL_TABLET | Freq: Every day | ORAL | Status: DC

## 2015-12-01 MED ORDER — AMIODARONE HCL 200 MG PO TABS: 200.0000 mg | ORAL_TABLET | Freq: Every day | ORAL | Status: DC

## 2015-12-01 MED ORDER — PANTOPRAZOLE SODIUM 40 MG PO TBEC: 40.0000 mg | DELAYED_RELEASE_TABLET | Freq: Every day | ORAL | Status: DC

## 2015-12-01 MED ORDER — POTASSIUM CHLORIDE CRYS ER 20 MEQ PO TBCR
40.0000 meq | EXTENDED_RELEASE_TABLET | Freq: Once | ORAL | Status: AC
  Administered 2015-12-01: 40 meq via ORAL
  Filled 2015-12-01: qty 2

## 2015-12-01 MED ORDER — SILVER SULFADIAZINE 1 % EX CREA: TOPICAL_CREAM | Freq: Two times a day (BID) | CUTANEOUS | Status: DC

## 2015-12-01 MED ORDER — SENNOSIDES-DOCUSATE SODIUM 8.6-50 MG PO TABS: 1.0000 | ORAL_TABLET | Freq: Every day | ORAL | Status: DC

## 2015-12-01 MED ORDER — CARVEDILOL 6.25 MG PO TABS: 6.2500 mg | ORAL_TABLET | Freq: Two times a day (BID) | ORAL | Status: DC

## 2015-12-01 MED ORDER — ASPIRIN 81 MG PO TABS: 81.0000 mg | ORAL_TABLET | Freq: Every day | ORAL | Status: DC

## 2015-12-01 NOTE — Progress Notes (Signed)
Patient ID: Christopher [name], male   DOB: Mar 20, 1961, 55 y.o.   MRN: [id number]    SUBJECTIVE:   Lifevest form completed and faxed by [facility] staff today. Dennis Bast, RN of [facility] aware.  Fitting scheduled for today. Pending insurance approval.   Tracheostomy out 11/26/15. No complaints today. Feels great and ready to go home.   Scheduled Meds: . amiodarone  200 mg Oral Daily  . antiseptic oral rinse  7 mL Mouth Rinse q12n4p  . aspirin  81 mg Oral Daily  . atorvastatin  80 mg Oral q1800  . carvedilol  6.25 mg Oral BID WC  . chlorhexidine  15 mL Mouth Rinse BID  . enoxaparin (LOVENOX) injection  40 mg Subcutaneous Q24H  . feeding supplement (ENSURE ENLIVE)  237 mL Oral Q1500  . hydrALAZINE  25 mg Oral Q8H  . pantoprazole  40 mg Oral Daily  . potassium chloride  40 mEq Oral Daily  . QUEtiapine  50 mg Oral QHS  . senna-docusate  1 tablet Per Tube QHS  . sildenafil  40 mg Oral TID  . spironolactone  25 mg Oral Daily  . torsemide  60 mg Oral Daily   Continuous Infusions:   PRN Meds:.acetaminophen, alum & mag hydroxide-simeth, bisacodyl, diphenhydrAMINE, guaiFENesin-dextromethorphan, levalbuterol, ondansetron (ZOFRAN) IV, oxyCODONE-acetaminophen **AND** oxyCODONE, prochlorperazine **OR** prochlorperazine **OR** prochlorperazine, sodium chloride, sodium phosphate, traMADol, traZODone    Filed Vitals:   11/30/15 0443 11/30/15 1346 12/01/15 0555 12/01/15 0556  BP: 120/68 107/69 119/81 119/81  Pulse: 72 71  72  Temp: 97.8 F (36.6 C) 97.7 F (36.5 C)  97.8 F (36.6 C)  TempSrc: Oral Oral  Oral  Resp: 18 18  17   Height:      Weight: 217 lb (98.431 kg)   221 lb 8 oz (100.472 kg)  SpO2: 97% 98%  97%    Intake/Output Summary (Last 24 hours) at 12/01/15 1010 Last data filed at 12/01/15 0900  Gross per 24 hour  Intake    240 ml  Output      0 ml  Net    240 ml    LABS: Basic Metabolic Panel:  Recent Labs  72/09/47 0551 12/01/15 0743  NA 134* 135  K 3.2* 3.4*  CL 93*  96*  CO2 26 29  GLUCOSE 98 109*  BUN 16 14  CREATININE 1.66* 1.60*  CALCIUM 8.6* 8.8*  MG  --  1.7   Liver Function Tests: No results for input(s): AST, ALT, ALKPHOS, BILITOT, PROT, ALBUMIN in the last 72 hours. No results for input(s): LIPASE, AMYLASE in the last 72 hours. CBC: No results for input(s): WBC, NEUTROABS, HGB, HCT, MCV, PLT in the last 72 hours. Cardiac Enzymes: No results for input(s): CKTOTAL, CKMB, CKMBINDEX, TROPONINI in the last 72 hours. BNP: Invalid input(s): POCBNP D-Dimer: No results for input(s): DDIMER in the last 72 hours. Hemoglobin A1C: No results for input(s): HGBA1C in the last 72 hours. Fasting Lipid Panel: No results for input(s): CHOL, HDL, LDLCALC, TRIG, CHOLHDL, LDLDIRECT in the last 72 hours. Thyroid Function Tests: No results for input(s): TSH, T4TOTAL, T3FREE, THYROIDAB in the last 72 hours.  Invalid input(s): FREET3 Anemia Panel: No results for input(s): VITAMINB12, FOLATE, FERRITIN, TIBC, IRON, RETICCTPCT in the last 72 hours.   PHYSICAL EXAM General: NAD. Ambulating around room without difficulty.  Neck: Thick, tracheostomy site C/D/I Lungs: Clear CV: Nondisplaced PMI.  Heart regular S1/S2, no S3/S4, no murmur.    Abdomen: Soft, NT, ND, no HSM. No  bruits or masses. +BS  Neurologic: Alert and oriented x 3.  Psych: Normal affect. Extremities: No clubbing or cyanosis.    ASSESSMENT AND PLAN:  1. CAD: S/p late presentation inferior MI and CABG x 4. Continue statin, ASA. Coronary angiography 3/27 with multiple VT episodes showed patent LIMA-LAD and SVG-RCA. Patent SVG-ramus and SVG-D but slow flow in both grafts with diffusely diseased small target vessels.  - Continue ASA 81 and statin.  2. Cardiogenic shock: Acute systolic CHF with RV failure post inferior MI with suspected RV involvement. EF 25-30% on echo (difficult images on 3/24 echo). He has significant RV failure from suspected RV infarct. 3/27 IABP placed to try to  allow decrease in milrinone with frequent VT. 3/31 echo with EF 30-35%. IABP out 4/2. Milrinone off 4/7. Volume status improved. - Continue torsemide 60 mg daily. Creatinine stable to improved. K 3.4 this am. Will give extra 40 meq today, and increase daily regimen to 40 q am, and 20 q pm.  - Can wear compression stockings  - continue spironolactone 25 mg daily.  - Continue Coreg 6.25 mg bid.   - Continue sildenafil 40 tid.  - Continue hydralazine 25 mg tid for afterload reduction (no Imdur with sildenafil use).  3. Complete heart block: Resolved.  4. VF arrest 3/24 early am and again at night 3/24.  - Continue po amio 200 mg daily. - Lifevest ordered, scheduled for fitting today. Plan to repeat ECHO in a couple of months for ICD evaluation.  5. AKI: Creatinine stable, follow carefully.   6. ID: Possible LLL PNA. He completed course of vancomycin/levofloxacin.  7. Pulmonary: S/P Trach 4/5 on trach collar. [name] now out. 8. Dysphagia: Passed swallow study.  9. Atrial flutter:  Regular pulse. He was off AC due to trach bleeding. On DVT prophylaxis. Will not start coumadin unless AF/AFL recurs. 10. Deconditioning: continue PT. 11. Hypokalemia - Increase K supp today.   Dispo per CIR team.  Stable from HF standpoint once Lifevest fitted  Will schedule HF follow up for ~10 days.   Mariam Dollar Tillery PA-C 12/01/2015 10:10 AM  Advanced Heart Failure Team Pager [phone] (M-F; 7a - 4p)  Please contact [facility] for night-coverage after hours (4p -7a ) and weekends on [email or web address]  Patient seen with PA, agree with the above note.  Plan for discharge.  He will need Lifevest and followup with me in 10-14 days.    Cardiac meds for home: Torsemide 60 daily Spironolactone 25 daily Coreg 6.25 bid Hydralazine 25 tid Revatio 40 tid ASA 81 Atorvastatin 80 daily KCl 40 qam/20 qpm Amiodarone 200 daily  [name] 12/01/2015 12:42 PM

## 2015-12-01 NOTE — Progress Notes (Signed)
[facility] PHYSICAL MEDICINE & REHABILITATION     PROGRESS NOTE    Subjective/Complaints: Didn't sleep well. "was interrupted". Otherwise feeling well. Breathing without issue  ROS: Pt denies fever, rash/itching, headache, blurred or double vision, nausea, vomiting, abdominal pain, diarrhea,   shortness of breath, palpitations, dysuria, dizziness, neck or back pain, bleeding, anxiety, or depression   Objective: Vital Signs: Blood pressure 119/81, pulse 72, temperature 97.8 F (36.6 C), temperature source Oral, resp. rate 17, height 5' 10"  (1.778 m), weight 100.472 kg (221 lb 8 oz), SpO2 97 %. No results found. No results for input(s): WBC, HGB, HCT, PLT in the last 72 hours.  Recent Labs  11/30/15 0551 12/01/15 0743  NA 134* 135  K 3.2* 3.4*  CL 93* 96*  GLUCOSE 98 109*  BUN 16 14  CREATININE 1.66* 1.60*  CALCIUM 8.6* 8.8*   CBG (last 3)  No results for input(s): GLUCAP in the last 72 hours.  Wt Readings from Last 3 Encounters:  12/01/15 100.472 kg (221 lb 8 oz)  11/24/15 103.7 kg (228 lb 9.9 oz)  10/20/15 116.121 kg (256 lb)    Physical Exam:  Constitutional: He is oriented to person, place, and time. He appears well-developed and well-nourished.  Trach with PMSV and strong voice noted.  HENT:  Head: Normocephalic and atraumatic.  Mouth/Throat: Oropharynx is clear and moist.  Eyes: Conjunctivae are normal. Pupils are equal, round, and reactive to light.  Neck: Neck supple. Trach stoma closed. No drainage. Cardiovascular: Normal rate and regular rhythm. Exam reveals distant heart sounds.  Respiratory: Effort normal. No respiratory distress. He has decreased breath sounds in the left   lower field. He has no wheezes. He has no rhonchi.  GI: He exhibits less distension. Bowel sounds are increased. There is no tenderness. There is no rebound.   Musculoskeletal: He exhibits edema.  BLE with edema tibially and 2+ pedally with unna boots in place.  Neurological:  He is alert and oriented to person, place, and time.  Wet voice but speech clear. Follows commands without difficulty. UE 3-4/5 prox to distal. LE: 3/5 HF, 3+ KE and 4/5 ADF/PF. No gross sensory deficits. Cognitively appropriate with reasonable insight and awareness.  Skin: Skin is warm and dry.  Sternal incision clean and dry with scabs. Rash on left buttocks--drug eruption per wife. Irritated area intergluteal cleft. Right leg donor site with staples and intact  Psychiatric: He has a normal mood and affect. His behavior is irritable, impulsive   Assessment/Plan: 1. Weakness, dysphagia, and functional deficits secondary to multiple medical issues which require 3+ hours per day of interdisciplinary therapy in a comprehensive inpatient rehab setting. Physiatrist is providing close team supervision and 24 hour management of active medical problems listed below. Physiatrist and rehab team continue to assess barriers to discharge/monitor patient progress toward functional and medical goals.  Function:  Bathing Bathing position   Position: Shower  Bathing parts Body parts bathed by patient: Right arm, Left arm, Chest, Abdomen, Front perineal area, Right upper leg, Left upper leg Body parts bathed by helper: Buttocks, Right lower leg, Left lower leg, Back  Bathing assist Assist Level:  (patient completed 70%, moderate assist)      Upper Body Dressing/Undressing Upper body dressing   What is the patient wearing?: Pull over shirt/dress     Pull over shirt/dress - Perfomed by patient: Thread/unthread right sleeve, Pull shirt over trunk, Thread/unthread left sleeve, Put head through opening  Upper body assist Assist Level: Set up   Set up : To obtain clothing/put away  Lower Body Dressing/Undressing Lower body dressing   What is the patient wearing?: Underwear, Pants, Non-skid slipper socks Underwear - Performed by patient: Thread/unthread right underwear leg, Thread/unthread  left underwear leg, Pull underwear up/down   Pants- Performed by patient: Thread/unthread right pants leg, Thread/unthread left pants leg, Pull pants up/down   Non-skid slipper socks- Performed by patient: Don/doff right sock, Don/doff left sock                    Lower body assist Assist for lower body dressing: Supervision or verbal cues      Toileting Toileting   Toileting steps completed by patient: Adjust clothing prior to toileting, Performs perineal hygiene, Adjust clothing after toileting   Toileting Assistive Devices: Grab bar or rail  Toileting assist Assist level: Supervision or verbal cues   Transfers Chair/bed transfer   Chair/bed transfer method: Ambulatory Chair/bed transfer assist level: No help, no cues Chair/bed transfer assistive device: Armrests     Locomotion Ambulation     Max distance: 300+ Assist level: No help, No cues, assistive device, takes more than a reasonable amount of time   Wheelchair Wheelchair activity did not occur: N/A        Cognition Comprehension Comprehension assist level: Follows complex conversation/direction with no assist  Expression Expression assist level: Expresses complex ideas: With extra time/assistive device  Social Interaction Social Interaction assist level: Interacts appropriately with others with medication or extra time (anti-anxiety, antidepressant).  Problem Solving Problem solving assist level: Solves complex problems: Recognizes & self-corrects  Memory Memory assist level: Complete Independence: No helper   Medical Problem List and Plan: 1. Weakness and functional deficits secondary to Debility.   -dc home today after life vest  -goals met. Needs cards/cvts follow up. i do not need to see him in follow up  -cardiac rehab 2. DVT Prophylaxis/Anticoagulation: mobility 3. Pain Management: tylenol prn/percocet tolerated 4. Mood: LCSW to follow for evaluation and support.  5. Neuropsych: This patient  is capable of making decisions on his own behalf. 6. Skin/Wound Care:stoma closed.  7. Fluids/Electrolytes/Nutrition: encourage po/fluids. 8. CAD s/p CABG: Continue sternal precautions. Monitor for symptoms with increased activity. On ASA and statin.  9. HTN: Monitor BP bid.  10. Acute combined systolic and diastolic CHF: Daily weights. Demadex to be increased in am. On hydralazine, demadex, sildenafil and statin.   -  unna dressings---->TEDS 11. Acute on chronic respiratory failure/OSA/OHS:trach out  -Continue xopenex--continue Xopenex tid and scheduled  -left pleural effusion --consvt mgt 12. Shocked liver: follow up labs reviewed and all wnl 13. Adynamic ileus: reglan dc'ed due to "nausea"  -tolerating po diet    -stool softener/lax 14. ABLA: Stable.  15. Complete HB: resolved. Had V Fib arrest--may need ICD v/s Life Vest at discharge? 16. A flutter: In NSR on amiodarone--no anticoagulation unless A fib/A flutter recurs.  17. Acute renal failure: Likely due to diuresis.  Encourage fluids   -kdur daily, 66mq.  -continue to supplement, spironolactone increased      18. Dysphagia:  -on D3 diet currently without any issues    LOS (Days) 7 A FACE TO FACE EVALUATION WAS PERFORMED  Christopher [name] T 12/01/2015 9:06 AM

## 2015-12-01 NOTE — Progress Notes (Signed)
Patient discharged home.  Left floor via wheelchair, escorted by nursing staff and family.  Patient and spouse verbalized understanding of discharge instructions as given by Marissa Nestle, PA.  All patient belongings sent with patient.  Appears to be in no immediate distress at this time.  Unable to set patient up with LifeVest prior to discharge.  [facility] is to be contacting patient at home to proceed.  Dani Gobble, RN

## 2015-12-01 NOTE — Progress Notes (Signed)
Occupational Therapy Discharge Summary  Patient Details  Name: Christopher [name] MRN: [id number] Date of Birth: [birth year]   Patient has met 8 of 8 long term goals due to improved activity tolerance, improved balance and postural control.  Patient to discharge at overall Modified Independent level.  Patient's care partner is independent to provide the necessary physical assistance at discharge.    Recommendation:  Patient with no further OT needs at this time.   Equipment: No equipment provided. Pt declined recommendation for shower chair. Therapist educated regarding purpose of chair for energy conservation. Pt cont to decline and stated he would purchase one if he deemed it necessary once back home.   Reasons for discharge: treatment goals met and discharge from hospital  Patient/family agrees with progress made and goals achieved: Yes  OT Discharge Precautions/Restrictions  Precautions Precautions: Sternal;Fall Restrictions Weight Bearing Restrictions: No Other Position/Activity Restrictions: Sternal preacutions General Chart Reviewed: Yes Vision/Perception  Vision- History Baseline Vision/History: Wears glasses Wears Glasses: At all times Patient Visual Report: No change from baseline  Cognition Overall Cognitive Status: Within Functional Limits for tasks assessed Safety/Judgment: Appears intact Sensation Sensation Light Touch: Appears Intact Coordination Gross Motor Movements are Fluid and Coordinated: Yes Motor  Motor Motor: Within Functional Limits Trunk/Postural Assessment  Cervical Assessment Cervical Assessment: Within Functional Limits Thoracic Assessment Thoracic Assessment: Within Functional Limits Lumbar Assessment Lumbar Assessment: Within Functional Limits Postural Control Postural Control: Within Functional Limits  Balance Balance Balance Assessed: Yes Static Sitting Balance Static Sitting - Level of Assistance: 7: Independent Dynamic Sitting  Balance Dynamic Sitting - Level of Assistance: 6: Modified independent (Device/Increase time) Static Standing Balance Static Standing - Balance Support: During functional activity;No upper extremity supported Static Standing - Level of Assistance: 6: Modified independent (Device/Increase time) Dynamic Standing Balance Dynamic Standing - Balance Support: During functional activity;No upper extremity supported Dynamic Standing - Level of Assistance: 6: Modified independent (Device/Increase time) Extremity/Trunk Assessment RUE Assessment RUE Assessment: Within Functional Limits LUE Assessment LUE Assessment: Within Functional Limits   See Function Navigator for Current Functional Status.  Lewis, Deran Barro C 12/01/2015, 7:21 AM

## 2015-12-01 NOTE — Progress Notes (Signed)
Physical Therapy Discharge Summary  Patient Details  Name: Christopher [name] MRN: [id number] Date of Birth: 04-Mar-1961  Today's Date: 12/01/2015 PT Individual Time: 0800-0900 PT Individual Time Calculation (min): 60 min  Pt excited to be going home and denying any concerns in regards to this. Focused on d/c assessment and grad day activities as well as finalizing d/c planning. Neuro re-ed on biodex for standing endurance, postural control, and balance reaction training on Biodex (see details below). Gait on unit and simulated community environment at modified independent level without assistive device including walking on mulch and up/down ramp. Reviewed energy conservation for d/c in home and community settings as well as eventual return back to work. Stair negotiation in stairwell at supervision level using R rail for support to simulate home entry. Nustep x 8 min on level 5 without rest break needed today for cardiovascular endurance training.   Biodex Limits of Stability: Trial 1: Level 8 compliant surface; 62% (41 sec) Trial 2: Level 8 compliant surface; 70% (31 sec) Trial 3: Level 8 compliant surface; 64% (29 sec) Random Control: Trial 1: Level 8 compliant surface; 1 min 30 sec; fast circle speed; 72% Trial 2: Level 8 compliant surface; 1 min 30 sec; medium circle speed; 83%  Patient has met 8 of 8 long term goals due to improved activity tolerance, improved balance and ability to compensate for deficits. Patient to discharge at an ambulatory level Modified Independent.Patient's care partner is independent to provide the necessary supervision assistance at discharge. Recommending supervision for stairs and longer distance gait due to easily fatigued.  Reasons goals not met: n/a - all goals met  Recommendation:  Patient will benefit from ongoing skilled PT services in cardiac rehab setting to continue to advance safe functional mobility, address ongoing impairments in  endurance/activity tolerance, balance and minimize fall risk.  Equipment: No equipment provided  Reasons for discharge: treatment goals met and discharge from hospital  Patient/family agrees with progress made and goals achieved: Yes   PT Discharge Precautions/Restrictions Precautions Precautions: Sternal Restrictions Weight Bearing Restrictions: No Other Position/Activity Restrictions: Sternal preacutions  Pain Pain Assessment Pain Assessment: No/denies pain Pain Score: 0-No pain     Cognition Overall Cognitive Status: Within Functional Limits for tasks assessed Orientation Level: Oriented X4 Safety/Judgment: Appears intact Sensation Sensation Light Touch: Appears Intact Proprioception: Appears Intact Coordination Gross Motor Movements are Fluid and Coordinated: Yes Motor  Motor Motor: Within Functional Limits     Trunk/Postural Assessment  Cervical Assessment Cervical Assessment: Within Functional Limits Thoracic Assessment Thoracic Assessment: Within Functional Limits Lumbar Assessment Lumbar Assessment: Within Functional Limits Postural Control Postural Control: Within Functional Limits  Balance Balance Balance Assessed: Yes Standardized Balance Assessment Standardized Balance Assessment: Timed Up and Go Test Timed Up and Go Test TUG: Normal TUG Normal TUG (seconds): 11.3 Static Sitting Balance Static Sitting - Level of Assistance: 7: Independent Dynamic Sitting Balance Dynamic Sitting - Level of Assistance: 6: Modified independent (Device/Increase time) Static Standing Balance Static Standing - Balance Support: During functional activity;No upper extremity supported Static Standing - Level of Assistance: 6: Modified independent (Device/Increase time) Dynamic Standing Balance Dynamic Standing - Balance Support: During functional activity;No upper extremity supported Dynamic Standing - Level of Assistance: 6: Modified independent (Device/Increase  time)   Gait velocity = 2.73 ft/sec  Extremity Assessment  RUE Assessment RUE Assessment: Within Functional Limits LUE Assessment LUE Assessment: Within Functional Limits RLE Assessment RLE Assessment: Within Functional Limits LLE Assessment LLE Assessment: Within Functional Limits   See Function Navigator  for Current Functional Status.  Canary Brim Ivory Broad, PT, DPT  12/01/2015, 2:05 PM

## 2015-12-01 NOTE — Discharge Summary (Signed)
Physician Discharge Summary  Patient ID: Christopher [name] MRN: [id number] DOB/AGE: 54-Sep-1962 54 y.o.  Admit date: 11/24/2015 Discharge date: 12/01/2015  Discharge Diagnoses:  Principal Problem:   Debility Active Problems:   Acute combined systolic and diastolic heart failure (HCC)   CAD (coronary artery disease)   S/P CABG x 4   Tracheostomy status (HCC)   Chronic obstructive pulmonary disease (HCC)   Acute renal failure (HCC)   Ileus (HCC)   VT (ventricular tachycardia) (HCC)   Hypokalemia   Discharged Condition: Stable.   Significant Diagnostic Studies: Dg Chest 2 View  11/28/2015  CLINICAL DATA:  Followup left pleural effusion and left lung base consolidation. EXAM: CHEST  2 VIEW COMPARISON:  11/24/2015 FINDINGS: Left pleural effusion is without significant change from the prior study. Associated parenchymal opacity is slightly improved. No new lung abnormalities. No right pleural effusion. No pneumothorax. Changes from CABG surgery are stable. Cardiac silhouette is borderline enlarged. No mediastinal widening or mediastinal or hilar masses. Right PICC and right internal jugular central venous line as well as tracheostomy tube and enteric tube have been removed since the prior study. IMPRESSION: 1. All lines and tubes have been removed. 2. Slight improvement in left lung base aeration. There is significant residual opacity reflecting a moderate effusion and either pneumonia or atelectasis or a combination. Electronically Signed   By: Amie Portland M.D.   On: 11/28/2015 07:47   Dg Chest 2 View  11/24/2015  CLINICAL DATA:  Respiratory failure. EXAM: CHEST  2 VIEW COMPARISON:  11/23/2015 and 11/21/2015. FINDINGS: Tracheostomy, feeding tube and right arm PICC appear unchanged. There appears to be additional tubing overlying the upper right chest on the frontal examination, not clearly seen on the lateral view. The heart size and mediastinal contours are stable status post CABG. Left  pleural effusion and left basilar airspace disease have not significantly changed. The right lung is clear. There is no pneumothorax. IMPRESSION: No significant change in left pleural effusion and left basilar airspace disease. Electronically Signed   By: Carey Bullocks M.D.   On: 11/24/2015 08:16   Dg Abd 1 View  11/24/2015  CLINICAL DATA:  Feeding tube placement.  History of ileus. EXAM: ABDOMEN - 1 VIEW COMPARISON:  Single-view of the abdomen 11/19/2015 and 11/18/2015. FINDINGS: Feeding tube is in place with the tip in the third portion of the duodenum. Gaseous distention of small and large bowel persists and appears mildly improved. IMPRESSION: Feeding tube tip is in the third portion of the duodenum. Mild improvement in gaseous distention of small and large bowel most compatible with ileus. Electronically Signed   By: Drusilla Kanner M.D.   On: 11/24/2015 18:54    Labs:  Basic Metabolic Panel:  Recent Labs Lab 11/25/15 0442 11/26/15 0553 11/27/15 1110 11/28/15 0454 11/30/15 0551 12/01/15 0743  NA 141 139 136 134* 134* 135  K 3.8 3.1* 3.8 2.9* 3.2* 3.4*  CL 103 97* 96* 94* 93* 96*  CO2 30 29 28 28 26 29   GLUCOSE 121* 110* 104* 92 98 109*  BUN 46* 35* 30* 27* 16 14  CREATININE 1.56* 1.83* 1.76* 1.74* 1.66* 1.60*  CALCIUM 8.5* 8.7* 8.8* 8.4* 8.6* 8.8*  MG  --   --   --   --   --  1.7    CBC:  Recent Labs Lab 11/25/15 0442 11/26/15 0553  WBC 6.4 6.5  NEUTROABS 4.5  --   HGB 9.8* 10.4*  HCT 32.1* 33.9*  MCV 89.9 89.4  PLT 187 202    CBG: No results for input(s): GLUCAP in the last 168 hours.  Brief HPI:   Christopher [name] is a 55 y.o. male with history of HTN, R-CAS who was admitted via [facility] with chest pain due to STEMI. He was found to have severe CAD on cardiac cath as well as > 80% R-ICA stenosis. Acute on combined systolic/diastolic CHF with mild volume overload treated with diuretics. He underwent CABG X 4 with placement on IAB by Dr. Donata Clay and  R-CEA by Dr. Imogene Burn. Post op had issues with VT/VF on 03/26-3/27 as well as issues with complete HB. IABP replaced or pressure support and requiring shocks as well as antiarrythmic's, external pacer for HB as well as re-intubation. pressors. Dr. Briant Cedar consulted for management of ARF due to ATN and recommended limiting steroids. He was unable to tolerate attempts at vent wean and tracheostomy placed on 04/05 by Dr. Molli Knock. He has been treated for HCAP and post op ileus treated with reglan. Bloody secretions from trach resolved with d/c of heparin and he was extubated to ATC. He is tolerating cuffed #6 trach but continues to have increased secretions therefore to continue on current trach until secretions improve. Sputum cultures 04/12 negative and he has completed treatment for possible HCAP. PMSV trials initiated with trials of ice cubes. He continues on tube feeds for nutritional support and has had wheezing with ambulation. Left pleural effusion persistent and Demadex adjusted. Therapy ongoing and CIR recommended due to debility   Hospital Course: Christopher [name] was admitted to rehab 11/24/2015 for inpatient therapies to consist of PT, ST and OT at least three hours five days a week. Past admission physiatrist, therapy team and rehab RN have worked together to provide customized collaborative inpatient rehab. He was kept on reglan and placed on bowel program to help manage ileus.  This was discontinued as abdominal distension resolved and regulation of bowel function.  Respiratory status was stable and MBS done on 04/19 showing improvement in swallow. He was started on modified diet with full supervision and swallow stratergies for safety.  PCCM was contacted for decannulation due to concerns of clot around/inside trach.  He was downsized to CFS# 4 and button plugged X 24 hours without difficulty. He was  decannulated on 04/21 and respiratory status has been stable. No chest pain reported with  increase in activity. His po intake has been good and he is continent of bowel and bladder.  Sternotomy incision is healing well without signs or symptoms of infection and he is able to adhere to sternal precautions without difficulty.  Follow up CXR was done prior to discharge and shows no change in effusion. Renal status has been monitored closely and has shown steady improvement with creatinine stabilizing at 1.6.  Cardiology has been following for input and diuretics were adjusted due to increase in weight and for better BP management.  Life Vest was recommended at discharge but was denied by insurance. Peer to Peer information was provided to cardiology, insurance company and [employer]. Patient preferred to be discharged to home and cardiology felt that he could be fitted with vest on outpatient basis.   He has had hypokalemia and K dur was increased to 60 mg daily a discharge.  Weight at discharge is 100.4 kg. He has made good progress and is modified independent at discharge. He has been educated on cardiac diet, energy conservation measures and compliance with MD follow up. He is to continue  Cardiac Rehab Phase II after discharge.    Rehab course: During patient's stay in rehab weekly team conferences were held to monitor patient's progress, set goals and discuss barriers to discharge. At admission, patient required min assist with basic self care and min assist with mobility.  Cognitive linguistic evaluation revealed mild impairments in recall with MoCA score 27/30 but patient and wife felt that this was at baseline. He has had improvement in activity tolerance, balance, postural control, awareness as well as ability to compensate for deficits. He is able to complete ADL tasks at modified independent level--he has declined recommendations to use shower chair to help with energy conservation. He is able to ambulate in supervised and community setting at modified independent level without AD. Diet has  been advanced to dysphagia 3, thin liquids and he is able to utilize compensatory strategies independently. He is able to complete basic tasks independently and requires extra time for recall.  He requires supervision to climb stairs. Family education was done with wife who has been present for most therapy sessions.    Disposition: 01-Home or Self Care  Diet: Heart healthy.   Special Instructions: 1. Check weight daily.  2. No driving, lifting or strenuous exercise till cleared by MD. 3. Keep wounds clean and dry.        Discharge Instructions    Amb Referral to Cardiac Rehabilitation    Complete by:  As directed   Diagnosis:   CABG Other STEMI    CABG X ___:  4            Medication List    STOP taking these medications        antiseptic oral rinse 0.05 % Liqd solution  Commonly known as:  CPC / CETYLPYRIDINIUM CHLORIDE 0.05%     chlorhexidine 0.12 % solution  Commonly known as:  PERIDEX     enoxaparin 40 MG/0.4ML injection  Commonly known as:  LOVENOX     feeding supplement (JEVITY 1.2 CAL) Liqd     feeding supplement (PRO-STAT SUGAR FREE 64) Liqd     free water Soln     levalbuterol 1.25 MG/0.5ML nebulizer solution  Commonly known as:  XOPENEX     metoCLOPramide 5 MG/ML injection  Commonly known as:  REGLAN     ondansetron 4 MG/2ML Soln injection  Commonly known as:  ZOFRAN     pantoprazole sodium 40 mg/20 mL Pack  Commonly known as:  PROTONIX  Replaced by:  pantoprazole 40 MG tablet     potassium chloride 20 MEQ/15ML (10%) Soln     triamcinolone cream 0.5 %  Commonly known as:  KENALOG      TAKE these medications        amiodarone 200 MG tablet  Commonly known as:  PACERONE  Take 1 tablet (200 mg total) by mouth daily.     aspirin 81 MG tablet  Take 1 tablet (81 mg total) by mouth daily.     atorvastatin 80 MG tablet  Commonly known as:  LIPITOR  Take 1 tablet (80 mg total) by mouth daily at 6 PM.     carvedilol 6.25 MG tablet   Commonly known as:  COREG  Take 1 tablet (6.25 mg total) by mouth 2 (two) times daily with a meal.     hydrALAZINE 25 MG tablet  Commonly known as:  APRESOLINE  Take 1 tablet (25 mg total) by mouth every 8 (eight) hours.     pantoprazole 40 MG tablet  Commonly known as:  PROTONIX  Take 1 tablet (40 mg total) by mouth daily.     potassium chloride SA 20 MEQ tablet  Commonly known as:  K-DUR,KLOR-CON  Take two pills in am and one pill in the evening.     QUEtiapine 50 MG tablet  Commonly known as:  SEROQUEL  Take 0.5-1 tablets (25-50 mg total) by mouth at bedtime. For sleep/anxiety     senna-docusate 8.6-50 MG tablet  Commonly known as:  Senokot-S  Take 1 tablet by mouth at bedtime. For constipation     sildenafil 20 MG tablet  Commonly known as:  REVATIO  Take 2 tablets (40 mg total) by mouth 3 (three) times daily.     silver sulfADIAZINE 1 % cream  Commonly known as:  SILVADENE  Apply topically 2 (two) times daily. To area till fully healed     sodium chloride 0.65 % Soln nasal spray  Commonly known as:  OCEAN  Place 1 spray into both nostrils as needed for congestion.     spironolactone 25 MG tablet  Commonly known as:  ALDACTONE  Take 1 tablet (25 mg total) by mouth daily.     torsemide 20 MG tablet  Commonly known as:  DEMADEX  Take 3 tablets (60 mg total) by mouth daily.       Follow-up Information    Follow up with [name], MD.   Specialty:  Physical Medicine and Rehabilitation   Why:  As needed   Contact information:   [address], [address] [place] [address] [phone]       Follow up with Kerin Perna III, MD On 12/10/2015.   Specialty:  Cardiothoracic Surgery   Why:  appointment at 11:40 am   Contact information:   [address] [place] [address] [phone]       Follow up with [name], MD On 12/28/2015.   Specialty:  Cardiology   Why:  appointment at 10:10 am   Contact information:   [address] [place] [address] [phone]       Follow up with Frederich Chick, MD On 12/09/2015.   Specialty:  Family Medicine   Why:  @ 4:00 PM   Contact information:   [address] [place] [address] [phone]       Signed: Jacquelynn Cree 12/01/2015, 5:33 PM

## 2015-12-01 NOTE — Progress Notes (Signed)
Speech Language Pathology Daily Session Note  Patient Details  Name: Christopher [name] MRN: [id number] Date of Birth: 11/01/1960  Today's Date: 12/01/2015 SLP Individual Time: 1035-1115 SLP Individual Time Calculation (min): 40 min  Short Term Goals: Week 1: SLP Short Term Goal 1 (Week 1): Patient will consume regular textures and thin liquids with minimal overt s/s of aspiration with Mod I for use of swallowing compensatory strategies. SLP Short Term Goal 2 (Week 1): Patient will perform pharyngeal strengthening exercises with Mod I.  SLP Short Term Goal 3 (Week 1): Patient will wear the PMSV during all waking hours with vitals remaining WFL and 100% intelligibility at the conversation level with Mod I.  SLP Short Term Goal 3 - Progress (Week 1): Met SLP Short Term Goal 4 (Week 1): Patient will independently donn/doff PMSV SLP Short Term Goal 4 - Progress (Week 1): Discontinued (comment) SLP Short Term Goal 5 (Week 1): Patient will participate in the MoCA to assess cognitive function.  SLP Short Term Goal 5 - Progress (Week 1): Met SLP Short Term Goal 6 (Week 1): Patient will demonstrate efficient mastication with complete  oral clearance of Dys. 3 textures without overt s/s of aspiration with Mod I for use of swallowing strategies overt 2 sessions prior to upgrade.  SLP Short Term Goal 6 - Progress (Week 1): Met  Skilled Therapeutic Interventions: Skilled treatment session focused on addressing dysphagia goals and wrap up of patient and family education.  Per chart review and patient report patient is tolerating yesterday's diet upgrade to Dys.3 textures with thin liquids.  SLP provided skilled observation of patient self-feeding regular textures and thin liquids with Mod I use of recommended safe swallow strategies.  Patient demonstrated efficient oral clearance and no overt s/s of aspiration during session.  SLP also facilitated session by providing education regarding s/s of aspiration to  watch for following discharge.  Goals complete and patient ready for discharge.     Function:  Eating Eating   Modified Consistency Diet: No Eating Assist Level: No help, No cues;Swallowing techniques: self managed           Cognition Comprehension Comprehension assist level: Follows complex conversation/direction with no assist  Expression   Expression assist level: Expresses complex ideas: With no assist  Social Interaction Social Interaction assist level: Interacts appropriately with others with medication or extra time (anti-anxiety, antidepressant).  Problem Solving Problem solving assist level: Solves complex problems: Recognizes & self-corrects  Memory Memory assist level: Complete Independence: No helper    Pain Pain Assessment Pain Assessment: No/denies pain  Therapy/Group: Individual Therapy   , M.A., CCC-SLP [phone]  , 12/01/2015, 1:54 PM   

## 2015-12-01 NOTE — Discharge Instructions (Signed)
Inpatient Rehab Discharge Instructions  JEYLAN ARRIZON Discharge date and time:    Activities/Precautions/ Functional Status: Activity: activity as tolerated and no lifting, driving, or strenuous exercise for till cleared by MD.  Diet: cardiac diet-- Wound Care: keep wound clean and dry   Functional status:  ___ No restrictions     ___ Walk up steps independently ___ 24/7 supervision/assistance   ___ Walk up steps with assistance ___ Intermittent supervision/assistance  ___ Bathe/dress independently ___ Walk with walker     ___ Bathe/dress with assistance ___ Walk Independently    ___ Shower independently ___ Walk with assistance    ___ Shower with assistance _X__ No alcohol     ___ Return to work/school ________  COMMUNITY REFERRALS UPON DISCHARGE:   Outpatient: Cardiopulmonary Rehab  Agency:  [id number]  Medical Equipment/Items Ordered:  None needed   Special Instructions:    My questions have been answered and I understand these instructions. I will adhere to these goals and the provided educational materials after my discharge from the hospital.  Patient/Caregiver Signature _______________________________ Date __________  Clinician Signature _______________________________________ Date __________  Please bring this form and your medication list with you to all your follow-up doctor's appointments.

## 2015-12-01 NOTE — Progress Notes (Signed)
Speech Language Pathology Discharge Summary  Patient Details  Name: KOA ZOELLER MRN: [id number] Date of Birth: 1960-09-19  Patient has met 3 of 3 long term goals.  Patient to discharge at overall Independent level.  Reasons goals not met: n/a   Clinical Impression/Discharge Summary:    Patient has made functional gains during this rehab admission and has met 3 out of 3 long term goals due to improved speech and swallow function.  Patient is currently an overall Mod I-Independent for speech intelligibility following decannulation and requires no assist for utilization of swallowing compensatory strategies to minimize overt s/s of aspiration with a regular texture and thin liquid diet. Patient and family education has been complete.  No further skilled SLP services are warranted at this time; patient in agreement with plan.   Care Partner:  Caregiver Able to Provide Assistance: Other (comment) (n/a)    Recommendation:  None     Equipment: none   Reasons for discharge: Treatment goals met;Discharged from hospital   Patient/Family Agrees with Progress Made and Goals Achieved: Yes   Carmelia Roller., [place]   [place] 12/01/2015, 2:31 PM

## 2015-12-01 NOTE — Progress Notes (Signed)
Occupational Therapy Session Note  Patient Details  Name: Christopher [name] MRN: [id number] Date of Birth: [birth year]  Today's Date: 12/01/2015 OT Individual Time: 1115-1150 OT Individual Time Calculation (min): 35 min    Short Term Goals: Week 1:  OT Short Term Goal 1 (Week 1): STG=LTG due to LOS  Skilled Therapeutic Interventions/Progress Updates:    Pt seen for OT ADL bathing/dressing session. Pt sitting up in recliner upon arrival with significant other present. Pt voicing frustration regarding trying to get matters in order for d/c, however with encouragement agreeable to tx session. He ambulated throughout unit mod I to ADL apartment. He completed showering task utilizing tub/shower combination to simulate home environment. He completed transfer into shower and standing bathing task at supervision- mod I level. Upon exiting, pt with mild LOB episode when stepping over tub wall, requiring min-mod A to regain balance. Educated regarding importance of taking mobility slowly and being cautious, pt put it off to being frustrated and mad regarding d/c difficulties. Pt dressed mod I. He returned to room and left sitting in recliner with significant other present. Communicated with RN, Interior and spatial designer of nursing and CSW regarding case.  Pt and significant other voiced feeling comfortable with d/c and no questions from a therapy stand point.   Therapy Documentation Precautions:  Precautions Precautions: Sternal, Fall Restrictions Weight Bearing Restrictions: No Other Position/Activity Restrictions: Sternal preacutions Pain:   No/ denies pain  See Function Navigator for Current Functional Status.   Therapy/Group: Individual Therapy  Lewis, Garald Rhew C 12/01/2015, 7:14 AM

## 2015-12-02 ENCOUNTER — Telehealth: Payer: Self-pay | Admitting: *Deleted

## 2015-12-02 NOTE — Progress Notes (Signed)
Social Work Patient ID: Christopher [name], male   DOB: [birth year], 55 y.o.   MRN: [id number]   12-02-15 1004  At the time of this writing, pt still awaits Life Vest approval.  CSW heard from [place], [employer] covering case today.  She wondered if CSW had heard about approval and CSW explained that MD would be the one to hear, as CSW could not provide [employer] with information they needed, MD was required.  CSW provided Christopher [name] with Dr. Alford Highland phone number to find if he heard from insurance and for them to work things out for pt.  However, CSW remains available to assist at needed.

## 2015-12-02 NOTE — Progress Notes (Signed)
Social Work Discharge Note  The overall goal for the admission was met for:   Discharge location: Yes - home  Length of Stay: Yes - 7 days  Discharge activity level: Yes - modified independent  Home/community participation: Yes  Services provided included: MD, RD, PT, OT, SLP, RN, Pharmacy and SW  Financial Services: Private Insurance: [employer] [place] [employer] [place]  Follow-up services arranged: Other: Pt to begin Cardiopulmonary Rehab Phase II as an outpatient.  No further PT/OT/SLP services needed and no DME was recommended.  Comments (or additional information): Pt d/c'd to home with his significant other to provide supervision/support/transportation as pt continues to recover.  Pt needed no DME at d/c, but was awaiting insurance approval for a Life Vest prior to d/c, as it was denied when authorization was requested.  Pt was ready to go home and Dr. Aundra Dubin, cardiologist, determined that pt could go home and await Life Vest fitting at home once insurance approval was received after peer-to-peer with Dr. Aundra Dubin and insurance company.  Pt will come to [facility] for Cardiopulmonary  Rehab.  CSW found pt a new PCP, Dr. Maurice Small, at [facility] and set him up with a post hospital f/u appt with her and faxed them pt's d/c summary.  Patient/Family verbalized understanding of follow-up arrangements: Yes  Individual responsible for coordination of the follow-up plan: pt with his significant other for support and transportation  Confirmed correct DME delivered: Trey Sailors 12/02/2015    Jaimie Redditt, Silvestre Mesi

## 2015-12-02 NOTE — Patient Care Conference (Signed)
Inpatient RehabilitationTeam Conference and Plan of Care Update Date: 12/01/2015   Time: 2:00 PM    Patient Name: Christopher [name]      Medical Record Number: [id number]  Date of Birth: 1960/10/17 Sex: Male         Room/Bed: 4W20C/4W20C-01 Payor Info: Payor: BLUE CROSS BLUE SHIELD / Plan: BCBS OTHER / Product Type: *No Product type* /    Admitting Diagnosis: Debility  Admit Date/Time:  11/24/2015  5:05 PM Admission Comments: No comment available   Primary Diagnosis:  Debility Principal Problem: Debility  Patient Active Problem List   Diagnosis Date Noted  . Hypokalemia 12/01/2015  . Debility 11/24/2015  . Coronary artery disease involving native coronary artery of native heart without angina pectoris   . VT (ventricular tachycardia) ([place])   . Ileus ([place])   . Hyperosmolality and/or hypernatremia   . Acute respiratory failure with hypoxia ([place])   . Chronic obstructive pulmonary disease ([place])   . Acute renal failure ([place])   . Ventilator dependence ([place])   . Tracheostomy status ([place])   . S/P CABG x 4 10/27/2015  . CAD (coronary artery disease)   . Hypertensive heart disease with heart failure ([place])   . Carotid artery stenosis   . Acute combined systolic and diastolic heart failure ([place]) 10/20/2015  . Complete heart block ([place]) 10/20/2015  . Acute MI, inferior wall, initial episode of care ([place]) 10/20/2015  . TIA (transient ischemic attack) 12/25/2014  . Essential hypertension 12/25/2014  . Hyperlipidemia 12/25/2014    Expected Discharge Date: Expected Discharge Date: 12/01/15  Team Members Present: Physician leading conference: Dr. Alger Simons Social Worker Present: Alfonse Alpers, LCSW Nurse Present: Rayetta Pigg, RN PT Present: Canary Brim, PT OT Present: Napoleon Form, OT SLP Present: Weston Anna, SLP PPS Coordinator present : Daiva Nakayama, RN, CRRN     Current Status/Progress Goal Weekly Team Focus  Medical   debility after cabg and respiratory failure. trach  out. on d3 diet. life vest today  stabilze medicallyu for dc  diet/swallowing, cv mgt/trach mgt   Bowel/Bladder   continent x2, LBM 4/23  remain continent  monitor B &B   Swallow/Nutrition/ Hydration   Dys. 3 textures with thin liquids, Mod I  Mod I with least restrictive diet  Family Edu, D/C today    ADL's   Supervision- mod I; goal level   Supervision- mod I overall  Pt has met all goals; plan for d/c today   Mobility   supervision to modified independent  modified independent overall; supervision for stairs  d/c planning, endurance, strength, balance   Communication   Mod I  Mod I  Family Edu, D/C today    Safety/Cognition/ Behavioral Observations            Pain   no c/o pain         Skin   surgical incision to chest, no skin breakdown  remain free of brealdown & infection  monitor skin qshift    Rehab Goals Patient on target to meet rehab goals: Yes Rehab Goals Revised: none *See Care Plan and progress notes for long and short-term goals.  Barriers to Discharge: none    Possible Resolutions to Barriers:  dc home today    Discharge Planning/Teaching Needs:  Pt to go home to his apartment with his significant other to provide supervision initially.  Pt's signficant other was present for therapies daily and received education.   Team Discussion:  Pt is medically ready for d/c.  Cardiology has cleared pt for d/c even if insurance approval does not come in and pt needs to d/c without Life Vest.  They will fit pt at home after peer-to-peer with cardiology and Psychologist, occupational.  Pt has met his therapy goals and has progressed well.  No ongoing therapy needs, just cardiopulmonary rehab phase II.  No DME recommended.  Revisions to Treatment Plan:  none   Continued Need for Acute Rehabilitation Level of Care: The patient requires daily medical management by a physician with specialized training in physical medicine and rehabilitation for the following  conditions: Daily direction of a multidisciplinary physical rehabilitation program to ensure safe treatment while eliciting the highest outcome that is of practical value to the patient.: Yes Daily medical management of patient stability for increased activity during participation in an intensive rehabilitation regime.: Yes Daily analysis of laboratory values and/or radiology reports with any subsequent need for medication adjustment of medical intervention for : Wound care problems;Post surgical problems;Cardiac problems;Pulmonary problems  [name], Silvestre Mesi 12/02/2015, 9:43 AM

## 2015-12-02 NOTE — Progress Notes (Signed)
Social Work Patient ID: Christopher [name], male   DOB: 12/05/1960, 55 y.o.   MRN: [id number]   CSW was made aware of problem with pt's Life Vest approval from insurance company.  Pt was upset and ready for d/c.  CSW began making phone calls to [facility] and learned that insurance had denied the authorization request.  CSW made Dr. Aundra Dubin (cardiologist), Reesa Chew (rehab PA), and Ezequiel Ganser Licensed conveyancer) of denial and process for peer-to-peer review.  CSW called review number and left Dr. Claris Gladden number for [facility] medical director to call him to review the case.  This doctor later called CSW after pt's d/c on 12-01-15 and she could not take the information from [place], she needed to talk with Dr. Aundra Dubin.  She had already left him a message.  Pt was ready to go home and Dr. Aundra Dubin cleared pt to leave and to be fitted with Life Vest as an outpt.  CSW spoke with pt's bedside RN, Loree Fee about this, as well as Administrator, arts, Verdis Frederickson.  CSW made several other phone calls without success, as MDs need to speak to each other.    As for team conference, pt has met goals and is ready to go home.  He feels ready to be home.  No recommended DME and CSW made sure order was in for Cardiopulmonary Rehab as this is recommended for pt and not PT/OT/SLP.  They will call pt to set up appointment.    No other needs at this time and CSW remains available to assist with Life Vest as needed.

## 2015-12-02 NOTE — Telephone Encounter (Signed)
Significant other called and [name] is not able to get some of his meds because pharmacy has concerns about interaction between seroquel and amiodarone, PLUS they will not fill the REVATIO because insurance will not cover.  It is my understanding this is being given for cardiac reasons not ED.  He has now missed 2 doses

## 2015-12-03 ENCOUNTER — Other Ambulatory Visit (HOSPITAL_COMMUNITY): Payer: Self-pay | Admitting: Pharmacist

## 2015-12-03 ENCOUNTER — Other Ambulatory Visit: Payer: Self-pay | Admitting: *Deleted

## 2015-12-03 ENCOUNTER — Telehealth (HOSPITAL_COMMUNITY): Payer: Self-pay | Admitting: *Deleted

## 2015-12-03 MED ORDER — SILDENAFIL CITRATE 20 MG PO TABS: 20.0000 mg | ORAL_TABLET | Freq: Three times a day (TID) | ORAL | Status: DC

## 2015-12-03 NOTE — Telephone Encounter (Signed)
Pt called upset and questioning why he is being fit for the [employer] life vest.  I asked patient did anyone explain the vest to him during his hospitalization he said no. Mardelle Matte our PA said that patient was given information in the hospital about the vest.  Patient requests a call back from Dr.McLean while the [employer] rep is there fitting him. I explained to patient that Dr.McLean is in clinic seeing patients and I would have him call as soon as I could.

## 2015-12-03 NOTE — Telephone Encounter (Signed)
Talked with patient, questions answered.

## 2015-12-03 NOTE — Telephone Encounter (Signed)
Sent PA for sildenafil 40 mg TID but in the meantime Dr. Shirlee Latch would like Mr. Christopher [name] to take 20 mg TID since we already have approval for this dose.   [name]. Bonnye Fava, PharmD, BCPS, CPP Clinical Pharmacist Pager: [phone] Phone: [phone] 12/03/2015 4:54 PM

## 2015-12-03 NOTE — Telephone Encounter (Signed)
Please see last note. Patient called again requesting call from Dr.McLean

## 2015-12-04 NOTE — Telephone Encounter (Signed)
Called patient, medication was refilled

## 2015-12-04 NOTE — Telephone Encounter (Signed)
Christopher [name] called again regarding her inability to get seroquel refilled.  She was told that they would have to receive authorization from the provider. Apparently there is a concern about interaction with amiodarone.

## 2015-12-10 ENCOUNTER — Ambulatory Visit (HOSPITAL_COMMUNITY)
Admit: 2015-12-10 | Discharge: 2015-12-10 | Disposition: A | Discharge status: Home / Self Care | Payer: BLUE CROSS/BLUE SHIELD | Source: Ambulatory Visit | Attending: Cardiology | Admitting: Cardiology

## 2015-12-10 ENCOUNTER — Encounter (HOSPITAL_COMMUNITY): Payer: Self-pay

## 2015-12-10 VITALS — BP 114/76 | HR 76 | Wt 219.2 lb

## 2015-12-10 DIAGNOSIS — Z79899 Other long term (current) drug therapy: Secondary | ICD-10-CM | POA: Diagnosis not present

## 2015-12-10 DIAGNOSIS — Z8249 Family history of ischemic heart disease and other diseases of the circulatory system: Secondary | ICD-10-CM | POA: Diagnosis not present

## 2015-12-10 DIAGNOSIS — I2510 Atherosclerotic heart disease of native coronary artery without angina pectoris: Secondary | ICD-10-CM | POA: Insufficient documentation

## 2015-12-10 DIAGNOSIS — I255 Ischemic cardiomyopathy: Secondary | ICD-10-CM | POA: Diagnosis not present

## 2015-12-10 DIAGNOSIS — N183 Chronic kidney disease, stage 3 (moderate): Secondary | ICD-10-CM | POA: Insufficient documentation

## 2015-12-10 DIAGNOSIS — I5022 Chronic systolic (congestive) heart failure: Secondary | ICD-10-CM | POA: Diagnosis not present

## 2015-12-10 DIAGNOSIS — Z7982 Long term (current) use of aspirin: Secondary | ICD-10-CM | POA: Insufficient documentation

## 2015-12-10 DIAGNOSIS — I252 Old myocardial infarction: Secondary | ICD-10-CM | POA: Diagnosis not present

## 2015-12-10 DIAGNOSIS — Z951 Presence of aortocoronary bypass graft: Secondary | ICD-10-CM

## 2015-12-10 DIAGNOSIS — I5041 Acute combined systolic (congestive) and diastolic (congestive) heart failure: Secondary | ICD-10-CM

## 2015-12-10 LAB — COMPREHENSIVE METABOLIC PANEL
ALK PHOS: 94 U/L (ref 38–126)
ALT: 32 U/L (ref 17–63)
AST: 31 U/L (ref 15–41)
Albumin: 3.9 g/dL (ref 3.5–5.0)
Anion gap: 13 (ref 5–15)
BUN: 22 mg/dL — AB (ref 6–20)
CHLORIDE: 98 mmol/L — AB (ref 101–111)
CO2: 25 mmol/L (ref 22–32)
CREATININE: 1.65 mg/dL — AB (ref 0.61–1.24)
Calcium: 9.4 mg/dL (ref 8.9–10.3)
GFR, EST AFRICAN AMERICAN: 53 mL/min — AB (ref >60)
GFR, EST NON AFRICAN AMERICAN: 46 mL/min — AB (ref >60)
Glucose, Bld: 97 mg/dL (ref 65–99)
Potassium: 4.4 mmol/L (ref 3.5–5.1)
Sodium: 136 mmol/L (ref 135–145)
Total Bilirubin: 0.9 mg/dL (ref 0.3–1.2)
Total Protein: 7.5 g/dL (ref 6.5–8.1)

## 2015-12-10 LAB — CBC
HEMATOCRIT: 39.9 % (ref 39.0–52.0)
Hemoglobin: 12.3 g/dL — ABNORMAL LOW (ref 13.0–17.0)
MCH: 27.2 pg (ref 26.0–34.0)
MCHC: 30.8 g/dL (ref 30.0–36.0)
MCV: 88.3 fL (ref 78.0–100.0)
Platelets: 245 10*3/uL (ref 150–400)
RBC: 4.52 MIL/uL (ref 4.22–5.81)
RDW: 17 % — AB (ref 11.5–15.5)
WBC: 7.9 10*3/uL (ref 4.0–10.5)

## 2015-12-10 LAB — BRAIN NATRIURETIC PEPTIDE: B Natriuretic Peptide: 501.5 pg/mL — ABNORMAL HIGH (ref 0.0–100.0)

## 2015-12-10 LAB — TSH: TSH: 6.204 u[IU]/mL — ABNORMAL HIGH (ref 0.350–4.500)

## 2015-12-10 MED ORDER — POTASSIUM CHLORIDE CRYS ER 20 MEQ PO TBCR: 40.0000 meq | EXTENDED_RELEASE_TABLET | Freq: Every day | ORAL | Status: DC

## 2015-12-10 MED ORDER — LOSARTAN POTASSIUM 25 MG PO TABS: 12.5000 mg | ORAL_TABLET | Freq: Every day | ORAL | Status: DC

## 2015-12-10 MED ORDER — TORSEMIDE 20 MG PO TABS: 40.0000 mg | ORAL_TABLET | Freq: Every day | ORAL | Status: DC

## 2015-12-10 NOTE — Patient Instructions (Signed)
START Losartan 12.5 mg (1/2 tab) once daily.  DECREASE Torsemide to 40 mg (2 tabs) once daily.  DECREASE Potassium to 40 meq (2 tabs) once daily.  Routine lab work today. Will notify you of abnormal results, otherwise no news is good news!  Will refer you to Cardiac Rehab at [facility]. They will call you to set up initial appointment.  Follow up 2 weeks with Dr. Shirlee Latch.  Do the following things EVERYDAY: 1) Weigh yourself in the morning before breakfast. Write it down and keep it in a log. 2) Take your medicines as prescribed 3) Eat low salt foods-Limit salt (sodium) to 2000 mg per day.  4) Stay as active as you can everyday 5) Limit all fluids for the day to less than 2 liters

## 2015-12-11 ENCOUNTER — Other Ambulatory Visit (HOSPITAL_COMMUNITY): Payer: Self-pay | Admitting: *Deleted

## 2015-12-11 MED ORDER — POTASSIUM CHLORIDE CRYS ER 20 MEQ PO TBCR: 40.0000 meq | EXTENDED_RELEASE_TABLET | Freq: Every day | ORAL | Status: DC

## 2015-12-11 NOTE — Progress Notes (Addendum)
Patient ID: Christopher [name], male   DOB: March 11, 1961, 55 y.o.   MRN: [id number] PCP: Dr. Hyman Hopes  55 yo with history of CAD s/p CABG, ischemic cardiomyopathy, CKD, and recent complicated admission presents for cardiology followup.  In 3/17, he came to the hospital with a late presentation inferior MI.  He was noted to have occluded RCA with collaterals and 95% proximal LAD.  He had CABG x 4 + right CEA.  He had a balloon pump with cardiogenic shock.  He additionally had multiple episodes of post-op VT/VF.  Amiodarone was started.  Repeat cath showed slow flow in the SVG-D and SVG-ramus given diffusely diseased and small distal targets.  He developed AKI.  He had prolonged mechanical ventilation and a tracheostomy.  He had RV failure that was treated with Revatio.  He had transient atrial flutter that resolved quickly.  Last echo in 3/17 showed EF 30-35%.  Finally, the tracheostomy was removed.  He went to inpatient rehab for about a week and is now home again.   He is wearing a Lifevest. He is doing quite well. He is walking twice a day and can walk 1/2 mile with no problems.  No chest pain.  No exertional dyspnea.  No orthopnea/PND.  No lightheadedness/falls.  He wants to go back to work next week.    ECG: NSR, LAE, old inferior MI, QRS 114 msec.   Labs (4/17): K 3.4, creatinine 1.6  PMH: 1. CAD: Late presentation inferior MI in 3/17.  - LHC showed totally occluded pRCA with L=>R collaterals, 95% pLAD, 70% ramus.   - CABG with LIMA-LAD, SVG-D, SVG-ramus, SVG-RCA.  - Repeat echo in 3/17 after CABG showed patent LIMA and SVG-RCA but SVG-D and SVG- ramus had slow flow with small, diffusely diseased distal targets.  2. Chronic systolic CHF: Ischemic cardiomyopathy.   - Echo (3/17) with EF 30-35%, septal and inferior AK.   3. RV failure: On Revatio. 4. CKD: Stage 3.  5. Atrial flutter: Transient, post-op.   6. VT: Post-op CABG.  7. Carotid stenosis: Right CEA 3/17.   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   Family History  Problem Relation Age of Onset  . Heart disease Mother   . Failure to thrive Father   . Congestive Heart Failure Father     Deceased  . Breast cancer Sister     Living   ROS: All systems reviewed and negative except as per HPI.   Current Outpatient Prescriptions  Medication Sig Dispense Refill  . amiodarone (PACERONE) 200 MG tablet Take 1 tablet (200 mg total) by mouth daily. 30 tablet 0  . aspirin 81 MG tablet Take 1 tablet (81 mg total) by mouth daily. 30 tablet   . atorvastatin (LIPITOR) 80 MG tablet Take 1 tablet (80 mg total) by mouth daily at 6 PM. 30 tablet 0  . carvedilol (COREG) 6.25 MG tablet Take 1 tablet (6.25 mg total) by mouth 2 (two) times daily with a meal. 60 tablet 0  . hydrALAZINE (APRESOLINE) 25 MG tablet Take 1 tablet (25 mg total) by mouth every 8 (eight) hours. 90 tablet 0  . HYDROcodone-acetaminophen (NORCO/VICODIN) 5-325 MG tablet Take 1 tablet by mouth every 6 (six) hours as needed for moderate pain.    . pantoprazole (PROTONIX) 40 MG tablet Take 1 tablet (40 mg total) by mouth daily. 30 tablet 0  . potassium chloride SA (K-DUR,KLOR-CON) 20 MEQ tablet Take 2 tablets (40 mEq total) by mouth daily. Take  two pills in am and one pill in the evening. 60 tablet 6  . QUEtiapine (SEROQUEL) 50 MG tablet Take 0.5-1 tablets (25-50 mg total) by mouth at bedtime. For sleep/anxiety 30 tablet 0  . senna-docusate (SENOKOT-S) 8.6-50 MG tablet Take 1 tablet by mouth at bedtime. For constipation 30 tablet 0  . sildenafil (REVATIO) 20 MG tablet Take 1 tablet (20 mg total) by mouth 3 (three) times daily. 90 tablet 1  . silver sulfADIAZINE (SILVADENE) 1 % cream Apply topically 2 (two) times daily. To area till fully healed 25 g 0  . sodium chloride (OCEAN) 0.65 % SOLN nasal spray Place 1 spray into both nostrils as needed for congestion.  0  . spironolactone (ALDACTONE) 25 MG tablet Take 1 tablet (25 mg total) by mouth daily. 30 tablet  0  . torsemide (DEMADEX) 20 MG tablet Take 2 tablets (40 mg total) by mouth daily. 60 tablet 6  . losartan (COZAAR) 25 MG tablet Take 0.5 tablets (12.5 mg total) by mouth daily. 45 tablet 6   No current facility-administered medications for this encounter.   BP 114/76 mmHg  Pulse 76  Wt 219 lb 4 oz (99.451 kg)  SpO2 99% General: NAD Neck: No JVD, no thyromegaly or thyroid nodule.  Lungs: Clear to auscultation bilaterally with normal respiratory effort. CV: Nondisplaced PMI.  Heart regular S1/S2, no S3/S4, no murmur.  No peripheral edema.  No carotid bruit.  Normal pedal pulses.  Abdomen: Soft, nontender, no hepatosplenomegaly, no distention.  Skin: Intact without lesions or rashes.  Neurologic: Alert and oriented x 3.  Psych: Normal affect. Extremities: No clubbing or cyanosis.  HEENT: Normal.   Assessment/Plan: 1. CAD: s/p CABG.  No chest pain.   - Start cardiac rehab.  - Continue ASA 81 and atorvastatin.  2. Atrial flutter: Occurred only post-CABG and was transient.  He will need anticoagulation if afib or flutter recurs.  3. RV failure: Continue Revatio for now.  RV was poorly visualized on last echo.  4. Chronic systolic CHF: Ischemic cardiomyopathy.  Echo in 3/17 with EF 30-35%.  NYHA class II symptoms.  Euvolemic on exam.  - Continue hydralazine 25 tid and sildenafil (no other nitrates given sildenafil use).  - Continue Coreg 6.25 mg bid.  - He had a rash with lisinopril, will start losartan 12.5 mg daily.  Check BMET today and again in 10 days. - He can decrease torsemide to 40 daily with KCl 40 mEq daily.  - Repeat echo at the end of June for ICD, continue Lifevest until then.  5. VT: He is on amiodarone, no recent events noted.   - Check LFTs, TSH on amiodarone.  Will need regular eye exam.  - Continue to wear Lifevest until followup echo to determine ICD.  6. CKD: Stage 3.  Watch closely with initiation of losartan.  7. Carotid stenosis: Right CEA in 2017.  Followed by  [facility].  He is on ASA and a statin.   Followup in 2 wks.   [name] 12/11/2015

## 2015-12-15 ENCOUNTER — Telehealth (HOSPITAL_COMMUNITY): Payer: Self-pay | Admitting: Pharmacist

## 2015-12-15 MED ORDER — SILDENAFIL CITRATE 20 MG PO TABS: 40.0000 mg | ORAL_TABLET | Freq: Three times a day (TID) | ORAL | Status: DC

## 2015-12-15 NOTE — Telephone Encounter (Addendum)
Sildenafil 40 mg TID PA approved by [employer] through 12/14/16 and pharmacy verified $0 copay. Patient notified of approval and to restart increased dose.   [name]. Bonnye Fava, PharmD, BCPS, CPP Clinical Pharmacist Pager: [phone] Phone: [phone] 12/15/2015 4:32 PM

## 2015-12-24 ENCOUNTER — Telehealth: Payer: Self-pay | Admitting: Cardiology

## 2015-12-24 NOTE — Telephone Encounter (Signed)
Agree except can do the 60 mg torsemide x 3 days then back to prior dose.  Would like him seen by HF PA in office in the next week.

## 2015-12-24 NOTE — Telephone Encounter (Signed)
Patient's wife called, patient has gained 4 pounds this week and 2 pounds since yesterday. He has been constipated and drank 64 ounces of juice yesterday in addition to other fluids. He is not SOB, no edema, no chest pain. Advised patient to take extra 20mg  of torsemide for a total of 60mg  torsemide today.

## 2015-12-25 NOTE — Telephone Encounter (Signed)
Patients wife aware and voiced understanding Follow up schedule 5/24

## 2015-12-28 ENCOUNTER — Encounter: Payer: BLUE CROSS/BLUE SHIELD | Admitting: Physician Assistant

## 2015-12-30 ENCOUNTER — Ambulatory Visit: Payer: BLUE CROSS/BLUE SHIELD | Admitting: Cardiothoracic Surgery

## 2015-12-30 ENCOUNTER — Telehealth: Payer: Self-pay | Admitting: Cardiology

## 2015-12-30 ENCOUNTER — Other Ambulatory Visit: Payer: Self-pay | Admitting: Physical Medicine and Rehabilitation

## 2015-12-30 ENCOUNTER — Telehealth (HOSPITAL_COMMUNITY): Payer: Self-pay | Admitting: Surgery

## 2015-12-30 ENCOUNTER — Ambulatory Visit (HOSPITAL_COMMUNITY)
Admission: RE | Admit: 2015-12-30 | Discharge: 2015-12-30 | Disposition: A | Discharge status: Home / Self Care | Payer: BLUE CROSS/BLUE SHIELD | Source: Ambulatory Visit | Attending: Cardiology | Admitting: Cardiology

## 2015-12-30 VITALS — BP 114/68 | HR 68 | Wt 226.0 lb

## 2015-12-30 DIAGNOSIS — Z8249 Family history of ischemic heart disease and other diseases of the circulatory system: Secondary | ICD-10-CM | POA: Diagnosis not present

## 2015-12-30 DIAGNOSIS — I5022 Chronic systolic (congestive) heart failure: Secondary | ICD-10-CM | POA: Insufficient documentation

## 2015-12-30 DIAGNOSIS — I6521 Occlusion and stenosis of right carotid artery: Secondary | ICD-10-CM | POA: Diagnosis not present

## 2015-12-30 DIAGNOSIS — I5041 Acute combined systolic (congestive) and diastolic (congestive) heart failure: Secondary | ICD-10-CM

## 2015-12-30 DIAGNOSIS — I2510 Atherosclerotic heart disease of native coronary artery without angina pectoris: Secondary | ICD-10-CM | POA: Insufficient documentation

## 2015-12-30 DIAGNOSIS — I255 Ischemic cardiomyopathy: Secondary | ICD-10-CM | POA: Diagnosis not present

## 2015-12-30 DIAGNOSIS — N183 Chronic kidney disease, stage 3 (moderate): Secondary | ICD-10-CM | POA: Insufficient documentation

## 2015-12-30 DIAGNOSIS — Z79899 Other long term (current) drug therapy: Secondary | ICD-10-CM | POA: Diagnosis not present

## 2015-12-30 DIAGNOSIS — I252 Old myocardial infarction: Secondary | ICD-10-CM | POA: Insufficient documentation

## 2015-12-30 DIAGNOSIS — Z7982 Long term (current) use of aspirin: Secondary | ICD-10-CM | POA: Diagnosis not present

## 2015-12-30 DIAGNOSIS — Z951 Presence of aortocoronary bypass graft: Secondary | ICD-10-CM | POA: Diagnosis not present

## 2015-12-30 LAB — BASIC METABOLIC PANEL
ANION GAP: 9 (ref 5–15)
BUN: 22 mg/dL — ABNORMAL HIGH (ref 6–20)
CO2: 26 mmol/L (ref 22–32)
CREATININE: 1.53 mg/dL — AB (ref 0.61–1.24)
Calcium: 9.6 mg/dL (ref 8.9–10.3)
Chloride: 101 mmol/L (ref 101–111)
GFR, EST AFRICAN AMERICAN: 58 mL/min — AB (ref >60)
GFR, EST NON AFRICAN AMERICAN: 50 mL/min — AB (ref >60)
Glucose, Bld: 97 mg/dL (ref 65–99)
Potassium: 3.8 mmol/L (ref 3.5–5.1)
Sodium: 136 mmol/L (ref 135–145)

## 2015-12-30 LAB — T4, FREE: FREE T4: 0.91 ng/dL (ref 0.61–1.12)

## 2015-12-30 LAB — BRAIN NATRIURETIC PEPTIDE: B NATRIURETIC PEPTIDE 5: 736.9 pg/mL — AB (ref 0.0–100.0)

## 2015-12-30 LAB — TSH: TSH: 3.967 u[IU]/mL (ref 0.350–4.500)

## 2015-12-30 MED ORDER — SPIRONOLACTONE 25 MG PO TABS: 25.0000 mg | ORAL_TABLET | Freq: Every day | ORAL | Status: DC

## 2015-12-30 MED ORDER — TORSEMIDE 20 MG PO TABS: ORAL_TABLET | ORAL | Status: DC

## 2015-12-30 MED ORDER — PANTOPRAZOLE SODIUM 40 MG PO TBEC: 40.0000 mg | DELAYED_RELEASE_TABLET | Freq: Every day | ORAL | Status: DC

## 2015-12-30 MED ORDER — AMIODARONE HCL 200 MG PO TABS: 200.0000 mg | ORAL_TABLET | Freq: Every day | ORAL | Status: DC

## 2015-12-30 MED ORDER — TORSEMIDE 20 MG PO TABS: 40.0000 mg | ORAL_TABLET | Freq: Every day | ORAL | Status: DC

## 2015-12-30 MED ORDER — POTASSIUM CHLORIDE CRYS ER 20 MEQ PO TBCR: 40.0000 meq | EXTENDED_RELEASE_TABLET | Freq: Every day | ORAL | Status: DC

## 2015-12-30 MED ORDER — LOSARTAN POTASSIUM 25 MG PO TABS: 25.0000 mg | ORAL_TABLET | Freq: Two times a day (BID) | ORAL | Status: DC

## 2015-12-30 MED ORDER — CARVEDILOL 6.25 MG PO TABS: 6.2500 mg | ORAL_TABLET | Freq: Two times a day (BID) | ORAL | Status: DC

## 2015-12-30 NOTE — Patient Instructions (Addendum)
Stop Hydralazine  Increase Losartan to 25 mg (1 tab) Twice daily   Change Torsemide to 40 mg (2 tabs) every other day ALTERNATING with 60 mg (3 tabs) every other day   Labs today  Your physician recommends that you schedule a follow-up appointment in: END OF June with echocardiogram

## 2015-12-30 NOTE — Telephone Encounter (Signed)
Patient called concerned about his Lifevest saying shock indicated.  He says he feels fine and has pressed button to delay treatmenet.  Herbert Seta Schub made aware and will look at strips provided from Lifevest to see patients rhythm.  He has appointment today at 2:30.

## 2015-12-30 NOTE — Telephone Encounter (Signed)
I returned call to patient to say that we had reviewed strips from Lifevest and that they seemed to indicate artifact.  Patient says that he still feels fine and has appt at 2:30 today and will be here for that.

## 2015-12-30 NOTE — Telephone Encounter (Signed)
Pt called-his Life Vest beeped but did not fire. He feels fine otherwise. He has an appointment today in the office and will keep that.   Corine Shelter PA-C 12/30/2015 6:59 AM

## 2015-12-31 ENCOUNTER — Other Ambulatory Visit (HOSPITAL_COMMUNITY): Payer: Self-pay | Admitting: *Deleted

## 2015-12-31 LAB — T3, FREE: T3 FREE: 2.4 pg/mL (ref 2.0–4.4)

## 2015-12-31 MED ORDER — ATORVASTATIN CALCIUM 80 MG PO TABS: ORAL_TABLET | ORAL | Status: DC

## 2015-12-31 NOTE — Progress Notes (Signed)
Patient ID: Christopher [name], male   DOB: 10-20-1960, 55 y.o.   MRN: [id number] PCP: Dr. Hyman Hopes  55 yo with history of CAD s/p CABG, ischemic cardiomyopathy, CKD, and recent complicated admission presents for cardiology followup.  In 3/17, he came to the hospital with a late presentation inferior MI.  He was noted to have occluded RCA with collaterals and 95% proximal LAD.  He had CABG x 4 + right CEA.  He had a balloon pump with cardiogenic shock.  He additionally had multiple episodes of post-op VT/VF.  Amiodarone was started.  Repeat cath showed slow flow in the SVG-D and SVG-ramus given diffusely diseased and small distal targets.  He developed AKI.  He had prolonged mechanical ventilation and a tracheostomy.  He had RV failure that was treated with Revatio.  He had transient atrial flutter that resolved quickly.  Last echo in 3/17 showed EF 30-35%.  Finally, the tracheostomy was removed.  He went to inpatient rehab for about a week and is now home again.   He is wearing a Lifevest. He is doing quite well.  He is back at work full time.  No chest pain.  No exertional dyspnea.  No orthopnea/PND.  No lightheadedness/falls.  Weight is up 7 lbs.  He is eating more, appetite is back.   ECG: NSR, LAE, old inferior MI, QRS 114 msec.   Labs (4/17): K 3.4, creatinine 1.6 Labs (5/17): K 4.4, creatinine 1.65, LFTs normal, TSH mildly elevated, BNP 502, hemoglobin 12.3  PMH: 1. CAD: Late presentation inferior MI in 3/17.  - LHC showed totally occluded pRCA with L=>R collaterals, 95% pLAD, 70% ramus.   - CABG with LIMA-LAD, SVG-D, SVG-ramus, SVG-RCA.  - Repeat echo in 3/17 after CABG showed patent LIMA and SVG-RCA but SVG-D and SVG- ramus had slow flow with small, diffusely diseased distal targets.  2. Chronic systolic CHF: Ischemic cardiomyopathy.   - Echo (3/17) with EF 30-35%, septal and inferior AK.   3. RV failure: On Revatio. 4. CKD: Stage 3.  5. Atrial flutter: Transient, post-op.   6. VT: Post-op  CABG.  7. Carotid stenosis: Right CEA 3/17.   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   Family History  Problem Relation Age of Onset  . Heart disease Mother   . Failure to thrive Father   . Congestive Heart Failure Father     Deceased  . Breast cancer Sister     Living   ROS: All systems reviewed and negative except as per HPI.   Current Outpatient Prescriptions  Medication Sig Dispense Refill  . amiodarone (PACERONE) 200 MG tablet Take 1 tablet (200 mg total) by mouth daily. 90 tablet 2  . aspirin 81 MG tablet Take 1 tablet (81 mg total) by mouth daily. 30 tablet   . carvedilol (COREG) 6.25 MG tablet Take 1 tablet (6.25 mg total) by mouth 2 (two) times daily with a meal. 180 tablet 2  . HYDROcodone-acetaminophen (NORCO/VICODIN) 5-325 MG tablet Take 1 tablet by mouth every 6 (six) hours as needed for moderate pain.    Marland Kitchen losartan (COZAAR) 25 MG tablet Take 1 tablet (25 mg total) by mouth 2 (two) times daily. 180 tablet 2  . pantoprazole (PROTONIX) 40 MG tablet Take 1 tablet (40 mg total) by mouth daily. 90 tablet 2  . potassium chloride SA (K-DUR,KLOR-CON) 20 MEQ tablet Take 2 tablets (40 mEq total) by mouth daily. 180 tablet 2  . senna-docusate (SENOKOT-S) 8.6-50 MG tablet Take  1 tablet by mouth at bedtime. For constipation 30 tablet 0  . sildenafil (REVATIO) 20 MG tablet Take 2 tablets (40 mg total) by mouth 3 (three) times daily. 180 tablet 2  . silver sulfADIAZINE (SILVADENE) 1 % cream Apply topically 2 (two) times daily. To area till fully healed 25 g 0  . sodium chloride (OCEAN) 0.65 % SOLN nasal spray Place 1 spray into both nostrils as needed for congestion.  0  . spironolactone (ALDACTONE) 25 MG tablet Take 1 tablet (25 mg total) by mouth daily. 90 tablet 2  . torsemide (DEMADEX) 20 MG tablet Take 2 tabs every other day ALTERNATING with 3 tabs every other day 220 tablet 2  . atorvastatin (LIPITOR) 80 MG tablet TAKE ONE TABLET BY MOUTH DAILY AT 6PM. 30 tablet 2   . QUEtiapine (SEROQUEL) 50 MG tablet TAKE ONE-HALF TO ONE TABLET BY MOUTH AT BEDTIME FOR SLEEP/ANXIETY 30 tablet 0   No current facility-administered medications for this encounter.   BP 114/68 mmHg  Pulse 68  Wt 226 lb (102.513 kg)  SpO2 98% General: NAD Neck: JVP 8 cm, no thyromegaly or thyroid nodule.  Lungs: Clear to auscultation bilaterally with normal respiratory effort. CV: Nondisplaced PMI.  Heart regular S1/S2, no S3/S4, no murmur.  1+ ankle edema.  No carotid bruit.  Normal pedal pulses.  Abdomen: Soft, nontender, no hepatosplenomegaly, no distention.  Skin: Intact without lesions or rashes.  Neurologic: Alert and oriented x 3.  Psych: Normal affect. Extremities: No clubbing or cyanosis.  HEENT: Normal.   Assessment/Plan: 1. CAD: s/p CABG.  No chest pain.   - Start cardiac rehab.  - Continue ASA 81 and atorvastatin.  2. Atrial flutter: Occurred only post-CABG and was transient.  He will need anticoagulation if afib or flutter recurs.  3. RV failure: Continue Revatio for now.  RV was poorly visualized on last echo.  4. Chronic systolic CHF: Ischemic cardiomyopathy.  Echo in 3/17 with EF 30-35%.  NYHA class II symptoms.  Mild volume overload.  - Stop hydralazine, increase losartan to 25 mg bid.  BMET/BNP today and repeat in 10 days.  If he tolerates this dose of losartan, can likely switch to [facility]  - Continue Coreg 6.25 mg bid and spironolactone 25 daily.  - Increase torsemide to 40 daily alternating with 60 daily.  - Repeat echo at the end of June for ICD, continue Lifevest until then.  5. VT: He is on amiodarone, no recent events noted.   - Recent LFTs normal.  Recent TSH high => repeat TSH and check free T3 and free T4.  Will need regular eye exam.  - Continue to wear Lifevest until followup echo to determine ICD.  6. CKD: Stage 3.  Watch closely with increase in losartan.  7. Carotid stenosis: Right CEA in 2017.  Followed by [facility].  He is on ASA and a statin.    Followup in 1 month with echo.  [name] 12/31/2015

## 2016-01-05 ENCOUNTER — Other Ambulatory Visit: Payer: Self-pay | Admitting: Cardiothoracic Surgery

## 2016-01-05 DIAGNOSIS — Z951 Presence of aortocoronary bypass graft: Secondary | ICD-10-CM

## 2016-01-06 ENCOUNTER — Encounter: Payer: Self-pay | Admitting: Cardiothoracic Surgery

## 2016-01-06 ENCOUNTER — Ambulatory Visit (INDEPENDENT_AMBULATORY_CARE_PROVIDER_SITE_OTHER): Payer: Self-pay | Admitting: Cardiothoracic Surgery

## 2016-01-06 ENCOUNTER — Ambulatory Visit
Admission: RE | Admit: 2016-01-06 | Discharge: 2016-01-06 | Disposition: A | Discharge status: Home / Self Care | Payer: BLUE CROSS/BLUE SHIELD | Source: Ambulatory Visit | Attending: Cardiothoracic Surgery | Admitting: Cardiothoracic Surgery

## 2016-01-06 VITALS — BP 107/73 | HR 63 | Resp 20 | Ht 70.0 in | Wt 226.0 lb

## 2016-01-06 DIAGNOSIS — Z951 Presence of aortocoronary bypass graft: Secondary | ICD-10-CM

## 2016-01-06 DIAGNOSIS — Z8679 Personal history of other diseases of the circulatory system: Secondary | ICD-10-CM

## 2016-01-06 NOTE — Progress Notes (Signed)
PCP is WEBB, Estill Batten, MD Referring Provider is Tonny Bollman, MD  Chief Complaint  Patient presents with  . Routine Post Op    f/u from surgery with CXR s/p CABG x 4 10/27/2015 and Placement of left subclavian vein central line-triple-lumen catheter 11/03/15    Christopher [name] returns for a postop visit after a long hospitalization after he presented post MI with ischemic cardiomyopathy and severe multivessel coronary disease in a diabetic pattern. Postoperatively the patient had cardiac dysfunction required intra-aortic balloon pump. Patient had recurrent episodes of VT requiring multiple shocks. The patient had postoperative pneumonia required prolonged ventilator support and tracheostomy. Patient was recathed  after surgery all bypass grafts were patent.  Patient is currently tolerating normal activity level, no symptoms of angina, no symptoms of CHF, and he is back to work. His ejection fraction is now approximately 30-35% with repeat echo Pending. The patient is wearing a life best but has had no shocks. Patient is followed at the advanced heart failure clinic.  Past Medical History  Diagnosis Date  . Hypertension   . TIA (transient ischemic attack)   . Stenosis of right carotid artery   . Complete heart block (HCC) 10/20/2015    Past Surgical History  Procedure Laterality Date  . Arm surgery Right 2003  . Eye surgery    . Laceration repair    . Cardiac catheterization N/A 10/20/2015    Procedure: Left Heart Cath and Coronary Angiography;  Surgeon: Tonny Bollman, MD;  Location: [facility] INVASIVE CV LAB;  Service: Cardiovascular;  Laterality: N/A;  . Coronary artery bypass graft N/A 10/27/2015    Procedure: CORONARY ARTERY BYPASS GRAFTING (CABG) x four, using left internal mammary artery and rt leg greater saphenous vein harvested endoscopically;  Surgeon: Kerin Perna, MD;  Location: [facility] OR;  Service: Open Heart Surgery;  Laterality: N/A;  . Tee without cardioversion N/A 10/27/2015     Procedure: TRANSESOPHAGEAL ECHOCARDIOGRAM (TEE);  Surgeon: Kerin Perna, MD;  Location: [facility] OR;  Service: Open Heart Surgery;  Laterality: N/A;  . Endarterectomy Right 10/27/2015    Procedure: RIGHT ENDARTERECTOMY CAROTID with patch angioplasty using Xenosure bovine pericardium patch;  Surgeon: Fransisco Hertz, MD;  Location: [facility] OR;  Service: Vascular;  Laterality: Right;  . Cardiac catheterization N/A 11/02/2015    Procedure: Right/Left Heart Cath and Coronary/Graft Angiography;  Surgeon: Laurey Morale, MD;  Location: [facility] INVASIVE CV LAB;  Service: Cardiovascular;  Laterality: N/A;    Family History  Problem Relation Age of Onset  . Heart disease Mother   . Failure to thrive Father   . Congestive Heart Failure Father     Deceased  . Breast cancer Sister     Living    Social History Social History  Substance Use Topics  . Smoking status: Former Smoker    Quit date: 03/23/2002  . Smokeless tobacco: Never Used  . 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 amiodarone (PACERONE) 200 MG tablet Take 1 tablet (200 mg total) by mouth daily. 90 tablet 2  . aspirin 81 MG tablet Take 1 tablet (81 mg total) by mouth daily. 30 tablet   . atorvastatin (LIPITOR) 80 MG tablet TAKE ONE TABLET BY MOUTH DAILY AT 6PM. 30 tablet 2  . carvedilol (COREG) 6.25 MG tablet Take 1 tablet (6.25 mg total) by mouth 2 (two)  times daily with a meal. 180 tablet 2  . HYDROcodone-acetaminophen (NORCO/VICODIN) 5-325 MG tablet Take 1 tablet by mouth every 6 (six) hours as needed for moderate pain.    Marland Kitchen losartan (COZAAR) 25 MG tablet Take 1 tablet (25 mg total) by mouth 2 (two) times daily. 180 tablet 2  . pantoprazole (PROTONIX) 40 MG tablet Take 1 tablet (40 mg total) by mouth daily. 90 tablet 2  . potassium chloride SA (K-DUR,KLOR-CON) 20 MEQ tablet Take 2 tablets (40 mEq total) by  mouth daily. 180 tablet 2  . QUEtiapine (SEROQUEL) 50 MG tablet TAKE ONE-HALF TO ONE TABLET BY MOUTH AT BEDTIME FOR SLEEP/ANXIETY 30 tablet 0  . senna-docusate (SENOKOT-S) 8.6-50 MG tablet Take 1 tablet by mouth at bedtime. For constipation 30 tablet 0  . sildenafil (REVATIO) 20 MG tablet Take 2 tablets (40 mg total) by mouth 3 (three) times daily. 180 tablet 2  . silver sulfADIAZINE (SILVADENE) 1 % cream Apply topically 2 (two) times daily. To area till fully healed 25 g 0  . sodium chloride (OCEAN) 0.65 % SOLN nasal spray Place 1 spray into both nostrils as needed for congestion.  0  . spironolactone (ALDACTONE) 25 MG tablet Take 1 tablet (25 mg total) by mouth daily. 90 tablet 2  . torsemide (DEMADEX) 20 MG tablet Take 2 tabs every other day ALTERNATING with 3 tabs every other day 220 tablet 2   No current facility-administered medications for this visit.    Allergies  Allergen Reactions  . Losartan Shortness Of Breath  . Elita Quick [Ceftazidime] Rash    Rash with blisters  . Lisinopril Rash    Review of Systems  No sensations of sternal click or pop Incisions healing well No edema Not requiring pain medication Improved appetite and exercise tolerance  BP 107/73 mmHg  Pulse 63  Resp 20  Ht  (1.778 m)  Wt 226 lb (102.513 kg)  BMI 32.43 kg/m2  SpO2 97% Physical Exam Alert and comfortable Lungs clear Heart rate regular without murmur or gallop Sternum stable well-healed Leg incision well-healed without pedal edema Neuro intact  Diagnostic Tests: Chest x-ray clear  Impression: Patient looks great after he presented with severe biventricular dysfunction after completed MI and subsequent CABG.  Plan:patient to continue current medications per Dr. Shirlee Latch at the advanced heart failure clinic Return for monitoring of progress in approximately 6 weeks.  Mikey Bussing, MD [facility] [phone]

## 2016-01-08 ENCOUNTER — Ambulatory Visit (HOSPITAL_COMMUNITY)
Admission: RE | Admit: 2016-01-08 | Discharge: 2016-01-08 | Disposition: A | Discharge status: Home / Self Care | Payer: BLUE CROSS/BLUE SHIELD | Source: Ambulatory Visit | Attending: Internal Medicine | Admitting: Internal Medicine

## 2016-01-08 DIAGNOSIS — I5041 Acute combined systolic (congestive) and diastolic (congestive) heart failure: Secondary | ICD-10-CM | POA: Diagnosis not present

## 2016-01-08 LAB — BASIC METABOLIC PANEL
ANION GAP: 7 (ref 5–15)
BUN: 25 mg/dL — ABNORMAL HIGH (ref 6–20)
CALCIUM: 9.7 mg/dL (ref 8.9–10.3)
CO2: 27 mmol/L (ref 22–32)
Chloride: 104 mmol/L (ref 101–111)
Creatinine, Ser: 1.72 mg/dL — ABNORMAL HIGH (ref 0.61–1.24)
GFR, EST AFRICAN AMERICAN: 50 mL/min — AB (ref >60)
GFR, EST NON AFRICAN AMERICAN: 43 mL/min — AB (ref >60)
GLUCOSE: 121 mg/dL — AB (ref 65–99)
POTASSIUM: 4.4 mmol/L (ref 3.5–5.1)
Sodium: 138 mmol/L (ref 135–145)

## 2016-01-11 ENCOUNTER — Other Ambulatory Visit (HOSPITAL_COMMUNITY): Payer: BLUE CROSS/BLUE SHIELD

## 2016-02-05 ENCOUNTER — Encounter (HOSPITAL_COMMUNITY): Payer: Self-pay

## 2016-02-05 ENCOUNTER — Ambulatory Visit (HOSPITAL_BASED_OUTPATIENT_CLINIC_OR_DEPARTMENT_OTHER)
Admission: RE | Admit: 2016-02-05 | Discharge: 2016-02-05 | Disposition: A | Discharge status: Home / Self Care | Payer: BLUE CROSS/BLUE SHIELD | Source: Ambulatory Visit | Attending: Cardiology | Admitting: Cardiology

## 2016-02-05 ENCOUNTER — Ambulatory Visit (HOSPITAL_COMMUNITY)
Admission: RE | Admit: 2016-02-05 | Discharge: 2016-02-05 | Disposition: A | Discharge status: Home / Self Care | Payer: BLUE CROSS/BLUE SHIELD | Source: Ambulatory Visit | Attending: Family Medicine | Admitting: Family Medicine

## 2016-02-05 VITALS — BP 135/75 | HR 64 | Ht 70.0 in | Wt 232.4 lb

## 2016-02-05 DIAGNOSIS — I5041 Acute combined systolic (congestive) and diastolic (congestive) heart failure: Secondary | ICD-10-CM | POA: Diagnosis not present

## 2016-02-05 DIAGNOSIS — Z951 Presence of aortocoronary bypass graft: Secondary | ICD-10-CM

## 2016-02-05 DIAGNOSIS — Z7982 Long term (current) use of aspirin: Secondary | ICD-10-CM | POA: Diagnosis not present

## 2016-02-05 DIAGNOSIS — I517 Cardiomegaly: Secondary | ICD-10-CM | POA: Diagnosis not present

## 2016-02-05 DIAGNOSIS — N183 Chronic kidney disease, stage 3 (moderate): Secondary | ICD-10-CM | POA: Insufficient documentation

## 2016-02-05 DIAGNOSIS — I509 Heart failure, unspecified: Secondary | ICD-10-CM | POA: Diagnosis present

## 2016-02-05 DIAGNOSIS — I2510 Atherosclerotic heart disease of native coronary artery without angina pectoris: Secondary | ICD-10-CM | POA: Insufficient documentation

## 2016-02-05 DIAGNOSIS — M79673 Pain in unspecified foot: Secondary | ICD-10-CM | POA: Diagnosis not present

## 2016-02-05 DIAGNOSIS — I255 Ischemic cardiomyopathy: Secondary | ICD-10-CM | POA: Insufficient documentation

## 2016-02-05 DIAGNOSIS — I5022 Chronic systolic (congestive) heart failure: Secondary | ICD-10-CM | POA: Diagnosis not present

## 2016-02-05 DIAGNOSIS — Z79899 Other long term (current) drug therapy: Secondary | ICD-10-CM | POA: Insufficient documentation

## 2016-02-05 LAB — ECHOCARDIOGRAM COMPLETE
CHL CUP MV DEC (S): 208
CHL CUP TV REG PEAK VELOCITY: 261 cm/s
E/e' ratio: 7.26
EWDT: 208 ms
FS: 24 % — AB (ref 28–44)
IV/PV OW: 0.73
LA ID, A-P, ES: 45 mm
LA diam end sys: 45 mm
LA vol A4C: 69.7 ml
LA vol index: 30.3 mL/m2
LA vol: 69.2 mL
LADIAMINDEX: 1.97 cm/m2
LVEEAVG: 7.26
LVEEMED: 7.26
LVELAT: 13.3 cm/s
Lateral S' vel: 8.1 cm/s
MV Peak grad: 4 mmHg
MV pk E vel: 96.5 m/s
MVPKAVEL: 59.7 m/s
PW: 9.59 mm — AB (ref 0.6–1.1)
RV sys press: 30 mmHg
TDI e' lateral: 13.3
TDI e' medial: 7.51
TRMAXVEL: 261 cm/s

## 2016-02-05 NOTE — Progress Notes (Signed)
  Echocardiogram 2D Echocardiogram has been performed.  Delcie Roch 02/05/2016, 2:49 PM

## 2016-02-05 NOTE — Patient Instructions (Signed)
Your physician has requested that you have an ankle brachial index (ABI). During this test an ultrasound and blood pressure cuff are used to evaluate the arteries that supply the arms and legs with blood. Allow thirty minutes for this exam. There are no restrictions or special instructions.  You have been referred to Podiatry for further evaluation of your foot pain  Fasting labs needed in one month  Your may discontinue use of your life vest. An order was sent to [employer] to stop usage, please follow all instructions with kit to return items to the company  Your physician recommends that you schedule a follow-up appointment in: 2 months  Do the following things EVERYDAY: 1) Weigh yourself in the morning before breakfast. Write it down and keep it in a log. 2) Take your medicines as prescribed 3) Eat low salt foods-Limit salt (sodium) to 2000 mg per day.  4) Stay as active as you can everyday 5) Limit all fluids for the day to less than 2 liters 6)

## 2016-02-05 NOTE — Progress Notes (Signed)
Discontinue order call placed to [employer]- [phone] RE: recovered EF

## 2016-02-07 DIAGNOSIS — I5022 Chronic systolic (congestive) heart failure: Secondary | ICD-10-CM | POA: Insufficient documentation

## 2016-02-07 NOTE — Progress Notes (Signed)
Patient ID: Christopher [name], male   DOB: 1961/03/17, 55 y.o.   MRN: [id number] PCP: Dr. Hyman Hopes Cardiology: Dr. Shirlee Latch  55 yo with history of CAD s/p CABG, ischemic cardiomyopathy, CKD, and recent complicated admission presents for cardiology followup.  In 3/17, he came to the hospital with a late presentation inferior MI.  He was noted to have occluded RCA with collaterals and 95% proximal LAD.  He had CABG x 4 + right CEA.  He had a balloon pump with cardiogenic shock.  He additionally had multiple episodes of post-op VT/VF.  Amiodarone was started.  Repeat cath showed slow flow in the SVG-D and SVG-ramus given diffusely diseased and small distal targets.  He developed AKI.  He had prolonged mechanical ventilation and a tracheostomy.  He had RV failure that was treated with Revatio.  He had transient atrial flutter that resolved quickly.  Last echo in 3/17 showed EF 30-35%.  Finally, the tracheostomy was removed.  He went to inpatient rehab for about a week and is now home again.   He is wearing a Lifevest. He is doing quite well.  He is back at work full time.  No chest pain.  No exertional dyspnea.  No orthopnea/PND.  No lightheadedness/falls.  Weight is up but appetite is back and he is eating more.  I reviewed his echo today, EF is around 40-45%.  Finally, he reports bothersome pain in the top of his feet.  No snoring, no daytime sleepiness.   ECG: NSR, LAE, old inferior MI, QRS 114 msec.   Labs (4/17): K 3.4, creatinine 1.6 Labs (5/17): K 4.4, creatinine 1.65, LFTs normal, TSH mildly elevated, free T3 and free T4 normal, BNP 502, hemoglobin 12.3 Labs (6/17): BNP 737, K 4.4, creatinine 1.72  PMH: 1. CAD: Late presentation inferior MI in 3/17.  - LHC showed totally occluded pRCA with L=>R collaterals, 95% pLAD, 70% ramus.   - CABG with LIMA-LAD, SVG-D, SVG-ramus, SVG-RCA.  - Repeat echo in 3/17 after CABG showed patent LIMA and SVG-RCA but SVG-D and SVG- ramus had slow flow with small, diffusely  diseased distal targets.  - Echo (6/17) with EF 40-45%, inferior severe hypokinesis, mildly dilated RV with normal systolic function, PASP 30 mmHg.  2. Chronic systolic CHF: Ischemic cardiomyopathy.   - Echo (3/17) with EF 30-35%, septal and inferior AK.   3. RV failure: On Revatio. 4. CKD: Stage 3.  5. Atrial flutter: Transient, post-op.   6. VT: Post-op CABG.  7. Carotid stenosis: Right CEA 3/17.   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   Family History  Problem Relation Age of Onset  . Heart disease Mother   . Failure to thrive Father   . Congestive Heart Failure Father     Deceased  . Breast cancer Sister     Living   ROS: All systems reviewed and negative except as per HPI.   Current Outpatient Prescriptions  Medication Sig Dispense Refill  . amiodarone (PACERONE) 200 MG tablet Take 1 tablet (200 mg total) by mouth daily. 90 tablet 2  . aspirin 81 MG tablet Take 1 tablet (81 mg total) by mouth daily. 30 tablet   . atorvastatin (LIPITOR) 80 MG tablet TAKE ONE TABLET BY MOUTH DAILY AT 6PM. 30 tablet 2  . carvedilol (COREG) 6.25 MG tablet Take 1 tablet (6.25 mg total) by mouth 2 (two) times daily with a meal. 180 tablet 2  . HYDROcodone-acetaminophen (NORCO/VICODIN) 5-325 MG tablet Take 1 tablet by  mouth every 6 (six) hours as needed for moderate pain.    Marland Kitchen losartan (COZAAR) 25 MG tablet Take 1 tablet (25 mg total) by mouth 2 (two) times daily. 180 tablet 2  . pantoprazole (PROTONIX) 40 MG tablet Take 1 tablet (40 mg total) by mouth daily. 90 tablet 2  . potassium chloride SA (K-DUR,KLOR-CON) 20 MEQ tablet Take 2 tablets (40 mEq total) by mouth daily. 180 tablet 2  . QUEtiapine (SEROQUEL) 50 MG tablet TAKE ONE-HALF TO ONE TABLET BY MOUTH AT BEDTIME FOR SLEEP/ANXIETY 30 tablet 0  . senna-docusate (SENOKOT-S) 8.6-50 MG tablet Take 1 tablet by mouth at bedtime. For constipation 30 tablet 0  . sildenafil (REVATIO) 20 MG tablet Take 2 tablets (40 mg total) by mouth 3  (three) times daily. 180 tablet 2  . spironolactone (ALDACTONE) 25 MG tablet Take 1 tablet (25 mg total) by mouth daily. 90 tablet 2  . torsemide (DEMADEX) 20 MG tablet Take 2 tabs every other day ALTERNATING with 3 tabs every other day 220 tablet 2   No current facility-administered medications for this encounter.   BP 135/75 mmHg  Pulse 64  Ht 5\' 10"  (1.778 m)  Wt 232 lb 6.4 oz (105.416 kg)  BMI 33.35 kg/m2  SpO2 100% General: NAD Neck: JVP 7 cm, no thyromegaly or thyroid nodule.  Lungs: Clear to auscultation bilaterally with normal respiratory effort. CV: Nondisplaced PMI.  Heart regular S1/S2, no S3/S4, no murmur. Trace ankle edema.  No carotid bruit.  Unable to palpate pedal pulses. Abdomen: Soft, nontender, no hepatosplenomegaly, no distention.  Skin: Intact without lesions or rashes.  Neurologic: Alert and oriented x 3.  Psych: Normal affect. Extremities: No clubbing or cyanosis.  HEENT: Normal.   Assessment/Plan: 1. CAD: s/p CABG.  No chest pain.   - Continue ASA 81 and atorvastatin.  Check lipids in 1 month.  2. Atrial flutter: Occurred only post-CABG and was transient.  He will need anticoagulation if afib or flutter recurs.  3. RV failure: Continue Revatio for now, may stop in future.  RV mildly dilated with normal systolic function on echo today.  4. Chronic systolic CHF: Ischemic cardiomyopathy.  Echo in 3/17 with EF 30-35%, improved to 40-45% on echo done today.  NYHA class II symptoms.  Volume stable.  - Continue losartan 25 mg bid.  - Continue Coreg 6.25 mg bid and spironolactone 25 daily.  - Continue current torsemide.   - With rise in EF, he can take off the Lifevest. 5. VT: He is on amiodarone, no recent events noted.   - Recent LFTs normal (repeat CMET in 1 month).  Recent thyroid indices normal.  Will need regular eye exam.  6. CKD: Stage 3.   7. Carotid stenosis: Right CEA in 2017.  Followed by [facility].  He is on ASA and a statin.   Followup in 2  months.  [name] 02/07/2016

## 2016-02-10 ENCOUNTER — Other Ambulatory Visit (HOSPITAL_COMMUNITY): Payer: Self-pay | Admitting: Cardiology

## 2016-02-10 DIAGNOSIS — M79673 Pain in unspecified foot: Secondary | ICD-10-CM

## 2016-02-17 ENCOUNTER — Ambulatory Visit (HOSPITAL_COMMUNITY)
Admission: RE | Admit: 2016-02-17 | Discharge: 2016-02-17 | Disposition: A | Discharge status: Home / Self Care | Payer: BLUE CROSS/BLUE SHIELD | Source: Ambulatory Visit | Attending: Cardiology | Admitting: Cardiology

## 2016-02-17 ENCOUNTER — Encounter: Payer: BLUE CROSS/BLUE SHIELD | Admitting: Cardiothoracic Surgery

## 2016-02-17 DIAGNOSIS — I708 Atherosclerosis of other arteries: Secondary | ICD-10-CM | POA: Diagnosis not present

## 2016-02-17 DIAGNOSIS — I771 Stricture of artery: Secondary | ICD-10-CM | POA: Insufficient documentation

## 2016-02-17 DIAGNOSIS — I10 Essential (primary) hypertension: Secondary | ICD-10-CM | POA: Diagnosis not present

## 2016-02-17 DIAGNOSIS — M79673 Pain in unspecified foot: Secondary | ICD-10-CM | POA: Insufficient documentation

## 2016-02-17 DIAGNOSIS — I700 Atherosclerosis of aorta: Secondary | ICD-10-CM | POA: Insufficient documentation

## 2016-02-17 DIAGNOSIS — R938 Abnormal findings on diagnostic imaging of other specified body structures: Secondary | ICD-10-CM | POA: Insufficient documentation

## 2016-02-17 DIAGNOSIS — I739 Peripheral vascular disease, unspecified: Secondary | ICD-10-CM | POA: Diagnosis not present

## 2016-02-17 DIAGNOSIS — I745 Embolism and thrombosis of iliac artery: Secondary | ICD-10-CM | POA: Insufficient documentation

## 2016-02-26 ENCOUNTER — Telehealth (HOSPITAL_COMMUNITY): Payer: Self-pay | Admitting: *Deleted

## 2016-02-26 DIAGNOSIS — I739 Peripheral vascular disease, unspecified: Secondary | ICD-10-CM

## 2016-02-26 NOTE — Telephone Encounter (Signed)
Notes Recorded by Noralee Space, RN on 02/26/2016 at 4:54 PM Patient aware. Referral placed

## 2016-03-07 ENCOUNTER — Ambulatory Visit (HOSPITAL_COMMUNITY)
Admission: RE | Admit: 2016-03-07 | Discharge: 2016-03-07 | Disposition: A | Discharge status: Home / Self Care | Payer: BLUE CROSS/BLUE SHIELD | Source: Ambulatory Visit | Attending: Cardiology | Admitting: Cardiology

## 2016-03-07 DIAGNOSIS — I5022 Chronic systolic (congestive) heart failure: Secondary | ICD-10-CM | POA: Diagnosis not present

## 2016-03-07 LAB — COMPREHENSIVE METABOLIC PANEL
ALK PHOS: 72 U/L (ref 38–126)
ALT: 18 U/L (ref 17–63)
AST: 28 U/L (ref 15–41)
Albumin: 4.2 g/dL (ref 3.5–5.0)
Anion gap: 5 (ref 5–15)
BILIRUBIN TOTAL: 1.1 mg/dL (ref 0.3–1.2)
BUN: 24 mg/dL — ABNORMAL HIGH (ref 6–20)
CALCIUM: 9.5 mg/dL (ref 8.9–10.3)
CO2: 25 mmol/L (ref 22–32)
CREATININE: 1.79 mg/dL — AB (ref 0.61–1.24)
Chloride: 108 mmol/L (ref 101–111)
GFR, EST AFRICAN AMERICAN: 48 mL/min — AB (ref >60)
GFR, EST NON AFRICAN AMERICAN: 41 mL/min — AB (ref >60)
Glucose, Bld: 117 mg/dL — ABNORMAL HIGH (ref 65–99)
Potassium: 4.9 mmol/L (ref 3.5–5.1)
Sodium: 138 mmol/L (ref 135–145)
TOTAL PROTEIN: 6.7 g/dL (ref 6.5–8.1)

## 2016-03-07 LAB — LIPID PANEL
CHOLESTEROL: 240 mg/dL — AB (ref 0–200)
HDL: 35 mg/dL — ABNORMAL LOW (ref >40)
LDL Cholesterol: 136 mg/dL — ABNORMAL HIGH (ref 0–99)
TRIGLYCERIDES: 346 mg/dL — AB (ref <150)
Total CHOL/HDL Ratio: 6.9 RATIO
VLDL: 69 mg/dL — ABNORMAL HIGH (ref 0–40)

## 2016-03-09 ENCOUNTER — Telehealth (HOSPITAL_COMMUNITY): Payer: Self-pay | Admitting: *Deleted

## 2016-03-09 DIAGNOSIS — I509 Heart failure, unspecified: Secondary | ICD-10-CM

## 2016-03-09 MED ORDER — ROSUVASTATIN CALCIUM 40 MG PO TABS: 40.0000 mg | ORAL_TABLET | Freq: Every day | ORAL | 3 refills | Status: DC

## 2016-03-09 NOTE — Telephone Encounter (Signed)
Notes Recorded by Modesta Messing, CMA on 03/09/2016 at 3:33 PM EDT Spoke with patient about lab results. Pt wanted me to go over results with his caretaker/significant Piedad Climes) other. He said he would have her call the office back as he was unsure of the dose of atorvastatin ( evelyn manages his medications). ------  Notes Recorded by Laurey Morale, MD on 03/08/2016 at 4:14 PM EDT Is he taking atorvastatin 80 mg daily? LDL is much too high. If not, start it. If he is taking it, needs to switch to Crestor 40 mg daily with lipids/LFTs in 2 months.    Ref Range & Units 2d ago 10mo ago   Cholesterol 0 - 200 mg/dL 435  686   Triglycerides <150 mg/dL 168  <372 mg/dL" class="[id number]">520    HDL >40 mg/dL 35  18    Total CHOL/HDL Ratio RATIO 6.9 8.3   VLDL 0 - 40 mg/dL 69  34   LDL Cholesterol 0 - 99 mg/dL 802  23VK  Comments:

## 2016-03-09 NOTE — Telephone Encounter (Signed)
Renea Ee returned call to report patient is taking atorvastatin 80 mg daily at bedtime. As ordered per Dr Shirlee Latch meds changed to crestor 40 and repeat labs x 2 months

## 2016-03-18 ENCOUNTER — Other Ambulatory Visit: Payer: Self-pay | Admitting: Cardiology

## 2016-03-23 ENCOUNTER — Encounter: Payer: BLUE CROSS/BLUE SHIELD | Admitting: Cardiothoracic Surgery

## 2016-03-28 ENCOUNTER — Encounter: Payer: Self-pay | Admitting: Vascular Surgery

## 2016-03-29 ENCOUNTER — Encounter: Payer: BLUE CROSS/BLUE SHIELD | Admitting: Cardiothoracic Surgery

## 2016-03-29 ENCOUNTER — Ambulatory Visit: Payer: BLUE CROSS/BLUE SHIELD | Admitting: Vascular Surgery

## 2016-03-29 ENCOUNTER — Encounter: Payer: Self-pay | Admitting: Vascular Surgery

## 2016-03-29 ENCOUNTER — Ambulatory Visit (INDEPENDENT_AMBULATORY_CARE_PROVIDER_SITE_OTHER): Payer: BLUE CROSS/BLUE SHIELD | Admitting: Vascular Surgery

## 2016-03-29 VITALS — BP 116/71 | HR 63 | Temp 97.6°F | Resp 20 | Ht 70.0 in | Wt 241.0 lb

## 2016-03-29 DIAGNOSIS — I739 Peripheral vascular disease, unspecified: Secondary | ICD-10-CM

## 2016-03-29 NOTE — Progress Notes (Signed)
Patient name: Christopher [name] MRN: [id number] DOB: 24-Apr-1961 Sex: male  REASON FOR VISIT: Day relation for lower extremity pain  HPI: Christopher [name] is a 55 y.o. male in today for discussion of lower extremity pain. I'd seen him approximately one year ago at which time he had had left brain TIA with right carotid asymptomatic stenosis. He subsequently presented in March 2017 with unstable coronary disease and underwent coronary artery bypass grafting and also right carotid endarterectomy with Dr. Claudie Fishermanhin. He had a very difficult postoperative course with congestive failure and postop V. tach V. fib with cardiogenic shock and the balloon pump. He eventually recovered and looks incredibly good today. He is seen today for discussion of his lower extremity arterial insufficiency. On my initial visit with him he did have clear-cut nonlimiting calf claudication. This continues. He is able to do his usual activities. He is very active at work and walks with no difficulty associated with this. If he is walking over a great distance he will notice some calf discomfort which is relieved with rest. His main complaint is of both pain in both feet. He reports that the end of the workday when he returns home and takes his shoes off that he has pain across the dorsum of his feet bilaterally going into his toes. This does not wake him up at night. He does not have any tissue loss but he does have a chronic history of ingrown toenails that he had self treated. This became quite extensive while he was hospitalized for several months. He currently has significant ingrown toenails in his great toes bilaterally. There is no inflammation currently associated with this  Past Medical History:  Diagnosis Date  . Complete heart block (HCC) 10/20/2015  . Hypertension   . Stenosis of right carotid artery   . TIA (transient ischemic attack)     Family History  Problem Relation Age of Onset  . Heart disease Mother   .  Failure to thrive Father   . Congestive Heart Failure Father     Deceased  . Breast cancer Sister     Living    SOCIAL HISTORY: Social History  Substance Use Topics  . Smoking status: Former Smoker    Quit date: 03/23/2002  . Smokeless tobacco: Never Used  . 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 Losartan Shortness Of Breath  . Elita QuickFortaz [Ceftazidime] Rash    Rash with blisters  . Lisinopril Rash    Current Outpatient Prescriptions  Medication Sig Dispense Refill  . amiodarone (PACERONE) 200 MG tablet Take 1 tablet (200 mg total) by mouth daily. 90 tablet 2  . aspirin 81 MG tablet Take 1 tablet (81 mg total) by mouth daily. 30 tablet   . carvedilol (COREG) 6.25 MG tablet Take 1 tablet (6.25 mg total) by mouth 2 (two) times daily with a meal. 180 tablet 2  . losartan (COZAAR) 25 MG tablet Take 1 tablet (25 mg total) by mouth 2 (two) times daily. 180 tablet 2  . pantoprazole (PROTONIX) 40 MG tablet Take 1 tablet (40 mg total) by mouth daily. 90 tablet 2  . potassium chloride SA (K-DUR,KLOR-CON) 20 MEQ tablet Take 2 tablets (40 mEq total) by mouth daily. 180 tablet 2  . QUEtiapine (SEROQUEL) 50 MG tablet TAKE ONE-HALF TO ONE TABLET BY MOUTH AT BEDTIME FOR SLEEP/ANXIETY 30  tablet 0  . rosuvastatin (CRESTOR) 40 MG tablet Take 1 tablet (40 mg total) by mouth daily. 90 tablet 3  . senna-docusate (SENOKOT-S) 8.6-50 MG tablet Take 1 tablet by mouth at bedtime. For constipation 30 tablet 0  . sildenafil (REVATIO) 20 MG tablet TAKE TWO TABLETS BY MOUTH THREE TIMES DAILY 180 tablet 2  . spironolactone (ALDACTONE) 25 MG tablet Take 1 tablet (25 mg total) by mouth daily. 90 tablet 2  . torsemide (DEMADEX) 20 MG tablet Take 2 tabs every other day ALTERNATING with 3 tabs every other day 220 tablet 2  . HYDROcodone-acetaminophen (NORCO/VICODIN) 5-325 MG tablet Take 1 tablet by mouth every 6  (six) hours as needed for moderate pain.     No current facility-administered medications for this visit.     REVIEW OF SYSTEMS:  [X]  denotes positive finding, [ ]  denotes negative finding Cardiac  Comments:  Chest pain or chest pressure:    Shortness of breath upon exertion:    Short of breath when lying flat:    Irregular heart rhythm:        Vascular    Pain in calf, thigh, or hip brought on by ambulation: x   Pain in feet at night that wakes you up from your sleep:  x   Blood clot in your veins:    Leg swelling:         Pulmonary    Oxygen at home:    Productive cough:     Wheezing:         Neurologic    Sudden weakness in arms or legs:     Sudden numbness in arms or legs:     Sudden onset of difficulty speaking or slurred speech:    Temporary loss of vision in one eye:     Problems with dizziness:         Gastrointestinal    Blood in stool:     Vomited blood:         Genitourinary    Burning when urinating:     Blood in urine:        Psychiatric    Major depression:         Hematologic    Bleeding problems:    Problems with blood clotting too easily:        Skin    Rashes or ulcers:        Constitutional    Fever or chills:      PHYSICAL EXAM: Vitals:   03/29/16 1514 03/29/16 1517  BP: 113/73 116/71  Pulse: 63   Resp: 20   Temp: 97.6 F (36.4 C)   SpO2: 98%   Weight: 241 lb (109.3 kg)   Height: 5\' 10"  (1.778 m)     GENERAL: The patient is a well-nourished male, in no acute distress. The vital signs are documented above. VASCULAR: Palpable femoral pulses and absent popliteal and distal pulses PULMONARY: There is good air exchange  MUSCULOSKELETAL: There are no major deformities or cyanosis. NEUROLOGIC: No focal weakness or paresthesias are detected. SKIN: There are no ulcers or rashes noted. PSYCHIATRIC: The patient has a normal affect. Does have a ingrown toenails on both great toes. No surrounding erythema  DATA:   Noninvasive studies  at [facility] from July revealed ankle arm index of 0.6 bilaterally  MEDICAL ISSUES:  Patient is doing well from a standpoint of his carotid surgery. He will see Dr. Claudie Fisherman in follow-up in 3 months.  He will have carotid duplex at that time.  Had long discussion with the patient regarding his foot pain. This does not appear to be related to arterial insufficiency. He is having mild claudication symptoms only. I suspect that this is related to neuropathy. He will discuss this with his primary care doctor, Dr. Hyman Hopes.  We have referred him to [facility] for treatment of his ingrown toenails. Although he does have mild to moderate reduction of his ankle arm index, I certainly would not recommend invasive treatment for bypass or other correction of his arterial insufficiency. I feel that he does have adequate arterial flow to his feet to heal any treatment required for ingrown toenails. He understands that if there is any difficulty healing this, we could proceed with intervention at that time. He was reassured this discussion will see [place] again in 3 months    Saul Fabiano [facility] [employer] [facility] [phone]

## 2016-03-30 ENCOUNTER — Telehealth: Payer: Self-pay | Admitting: Vascular Surgery

## 2016-03-30 NOTE — Telephone Encounter (Signed)
I called patient and left message for him regarding the referral TFE made yesterday for the patient to see seen @ [facility]. I faxed office notes and the referral to them and they stated they would contact the patient directly to schedule. I indicated this to the patient. awt

## 2016-03-31 ENCOUNTER — Telehealth: Payer: Self-pay | Admitting: Vascular Surgery

## 2016-03-31 NOTE — Telephone Encounter (Signed)
At Dr.Early's request I sent referral to [facility] for this patient. They received the referral and also have left a message for the patient to call them and schedule an appt. I left a message for the patient with the ph # of their office so he can call directly to schedule an appt. awt

## 2016-04-01 ENCOUNTER — Encounter: Payer: BLUE CROSS/BLUE SHIELD | Admitting: Cardiothoracic Surgery

## 2016-04-04 ENCOUNTER — Other Ambulatory Visit: Payer: Self-pay | Admitting: Vascular Surgery

## 2016-04-04 DIAGNOSIS — G451 Carotid artery syndrome (hemispheric): Secondary | ICD-10-CM

## 2016-04-04 DIAGNOSIS — I6529 Occlusion and stenosis of unspecified carotid artery: Secondary | ICD-10-CM

## 2016-04-04 DIAGNOSIS — L600 Ingrowing nail: Secondary | ICD-10-CM

## 2016-04-06 ENCOUNTER — Encounter (HOSPITAL_COMMUNITY): Payer: Self-pay

## 2016-04-06 ENCOUNTER — Telehealth (HOSPITAL_COMMUNITY): Payer: Self-pay | Admitting: *Deleted

## 2016-04-06 ENCOUNTER — Ambulatory Visit (HOSPITAL_COMMUNITY)
Admission: RE | Admit: 2016-04-06 | Discharge: 2016-04-06 | Disposition: A | Discharge status: Home / Self Care | Payer: BLUE CROSS/BLUE SHIELD | Source: Ambulatory Visit | Attending: Cardiology | Admitting: Cardiology

## 2016-04-06 VITALS — BP 102/66 | HR 63 | Wt 242.5 lb

## 2016-04-06 DIAGNOSIS — I739 Peripheral vascular disease, unspecified: Secondary | ICD-10-CM

## 2016-04-06 DIAGNOSIS — I2510 Atherosclerotic heart disease of native coronary artery without angina pectoris: Secondary | ICD-10-CM | POA: Diagnosis not present

## 2016-04-06 DIAGNOSIS — I252 Old myocardial infarction: Secondary | ICD-10-CM | POA: Diagnosis not present

## 2016-04-06 DIAGNOSIS — Z79899 Other long term (current) drug therapy: Secondary | ICD-10-CM | POA: Diagnosis not present

## 2016-04-06 DIAGNOSIS — E785 Hyperlipidemia, unspecified: Secondary | ICD-10-CM | POA: Insufficient documentation

## 2016-04-06 DIAGNOSIS — I255 Ischemic cardiomyopathy: Secondary | ICD-10-CM | POA: Diagnosis not present

## 2016-04-06 DIAGNOSIS — N183 Chronic kidney disease, stage 3 (moderate): Secondary | ICD-10-CM | POA: Diagnosis not present

## 2016-04-06 DIAGNOSIS — Z7982 Long term (current) use of aspirin: Secondary | ICD-10-CM | POA: Insufficient documentation

## 2016-04-06 DIAGNOSIS — Z951 Presence of aortocoronary bypass graft: Secondary | ICD-10-CM | POA: Diagnosis not present

## 2016-04-06 DIAGNOSIS — I5022 Chronic systolic (congestive) heart failure: Secondary | ICD-10-CM | POA: Diagnosis present

## 2016-04-06 DIAGNOSIS — Z8249 Family history of ischemic heart disease and other diseases of the circulatory system: Secondary | ICD-10-CM | POA: Diagnosis not present

## 2016-04-06 LAB — BASIC METABOLIC PANEL
ANION GAP: 9 (ref 5–15)
BUN: 45 mg/dL — ABNORMAL HIGH (ref 6–20)
CALCIUM: 9.4 mg/dL (ref 8.9–10.3)
CHLORIDE: 101 mmol/L (ref 101–111)
CO2: 23 mmol/L (ref 22–32)
Creatinine, Ser: 2.17 mg/dL — ABNORMAL HIGH (ref 0.61–1.24)
GFR calc non Af Amer: 33 mL/min — ABNORMAL LOW (ref >60)
GFR, EST AFRICAN AMERICAN: 38 mL/min — AB (ref >60)
GLUCOSE: 72 mg/dL (ref 65–99)
POTASSIUM: 3.8 mmol/L (ref 3.5–5.1)
Sodium: 133 mmol/L — ABNORMAL LOW (ref 135–145)

## 2016-04-06 LAB — BRAIN NATRIURETIC PEPTIDE: B Natriuretic Peptide: 239.3 pg/mL — ABNORMAL HIGH (ref 0.0–100.0)

## 2016-04-06 MED ORDER — CARVEDILOL 6.25 MG PO TABS: 9.3750 mg | ORAL_TABLET | Freq: Two times a day (BID) | ORAL | 2 refills | Status: DC

## 2016-04-06 MED ORDER — TORSEMIDE 20 MG PO TABS: ORAL_TABLET | ORAL | 2 refills | Status: DC

## 2016-04-06 NOTE — Patient Instructions (Addendum)
Increase Carvedilol to 9.375 mg (1 & 1/2 tabs) Twice daily   Stop Amiodarone   Labs today  Your physician recommends that you return for a FASTING lipid profile: Oct  We will contact you in 3 months to schedule your next appointment.

## 2016-04-06 NOTE — Telephone Encounter (Signed)
Notes Recorded by Modesta Messing, CMA on 04/06/2016 at 4:25 PM EDT Patient aware. Medications updated in patients chart.   ------  Notes Recorded by Laurey Morale, MD on 04/06/2016 at 3:59 PM EDT Hold torsemide x 1 day, then decrease to 40 daily alternating with 20 daily.    Ref Range & Units 15:30 8mo ago 32mo ago   Sodium 135 - 145 mmol/L 133  138 138   Potassium 3.5 - 5.1 mmol/L 3.8 4.9CM 4.4   Chloride 101 - 111 mmol/L 101 108 104   CO2 22 - 32 mmol/L 23 25 27    Glucose, Bld 65 - 99 mg/dL 72 616  073    BUN 6 - 20 mg/dL 45  24  25    Creatinine, Ser 0.61 - 1.24 mg/dL 7.10  6.26  9.48    Calcium 8.9 - 10.3 mg/dL 9.4 9.5 9.7   GFR calc non Af Amer >60 mL/min 33  41  43    GFR calc Af Amer >60 mL/min 38  48CM  50CM

## 2016-04-07 ENCOUNTER — Encounter: Payer: Self-pay | Admitting: Podiatry

## 2016-04-07 ENCOUNTER — Ambulatory Visit (INDEPENDENT_AMBULATORY_CARE_PROVIDER_SITE_OTHER): Payer: BLUE CROSS/BLUE SHIELD | Admitting: Podiatry

## 2016-04-07 DIAGNOSIS — L600 Ingrowing nail: Secondary | ICD-10-CM

## 2016-04-07 MED ORDER — NEOMYCIN-POLYMYXIN-HC 1 % OT SOLN: OTIC | 1 refills | Status: DC

## 2016-04-07 NOTE — Progress Notes (Signed)
   Subjective:    Patient ID: Christopher [name], male    DOB: 01-20-1961, 55 y.o.   MRN: [id number]  HPI: He presents today with several month duration of ingrown toenails to the hallux bilateral.    Review of Systems  Gastrointestinal: Positive for constipation.  Musculoskeletal: Positive for gait problem.  All other systems reviewed and are negative.      Objective:   Physical Exam: Vital signs are stable alert and oriented 3. Pulses are strongly palpable. Neurologic sensorium is intact. Deep tendon reflexes are intact. Muscle strength is normal bilateral. Orthopedic evaluation injuries SINCE OF ANKLE RANGE OF MOTION WITHOUT CREPITATION. CUTANEOUS EVALUATION OF STRAIGHT SHARP RADIAL NAIL MARGINS ALONG THE TIBIAL AND FIBULAR BORDER OF THE HALLUX BILATERAL. THESE ARE INGROWN AND PAINFUL.        Assessment & Plan:  Ingrown nails paronychia abscess hallux bilateral.  Plan: Discussed etiology pathology conservative versus surgical therapies. Perform chemical matrixectomy's to the tibia-fibula border hallux bilateral today after local anesthesia was administered. He tolerated his procedure well. He was provided with both oral and written home going instructions as well as a prescription for Cortisporin Otic to be applied twice daily at percent area follow-up with him in 1 week.

## 2016-04-07 NOTE — Patient Instructions (Signed)

## 2016-04-08 ENCOUNTER — Telehealth: Payer: Self-pay | Admitting: *Deleted

## 2016-04-08 NOTE — Telephone Encounter (Signed)
Renea Ee states pt is having a lot of bleeding from his toenail procedure, is that normal.  Renea Ee states pt is also taking aspirin for his heart.  I told Renea Ee it was not unusual for a pt to have bleeding or oozing to varying amounts for up to 4-6 weeks after the toenail procedure and being on aspirin he may do a little more bleeding. I instructed Renea Ee to get Telfa type pad, lightly coat with neosporin and then wrap with gauze to add padding and pressure and rest and elevate the foot if possible. Renea Ee states understanding, and will inform pt.

## 2016-04-08 NOTE — Progress Notes (Signed)
Patient ID: Christopher [name], male   DOB: September 23, 1960, 55 y.o.   MRN: [id number] PCP: Dr. Hyman [name] Cardiology: Dr. Shirlee LatchMcLean  55 yo with history of CAD s/p CABG, ischemic cardiomyopathy, CKD, and recent complicated admission presents for cardiology followup.  In 3/17, he came to the hospital with a late presentation inferior MI.  He was noted to have occluded RCA with collaterals and 95% proximal LAD.  He had CABG x 4 + right CEA.  He had a balloon pump with cardiogenic shock.  He additionally had multiple episodes of post-op VT/VF.  Amiodarone was started.  Repeat cath showed slow flow in the SVG-D and SVG-ramus given diffusely diseased and small distal targets.  He developed AKI.  He had prolonged mechanical ventilation and a tracheostomy.  He had RV failure that was treated with Revatio.  He had transient atrial flutter that resolved quickly.  Last echo in 3/17 showed EF 30-35%.  Finally, the tracheostomy was removed.  He went to inpatient rehab for about a week and is now home again.  Echo 6/17 with EF 40-45%, Lifevest now off.   He is doing quite well.  He is back at work full time.  No chest pain.  No exertional dyspnea.  No orthopnea/PND.  No lightheadedness/falls.  He has occasional mild calf cramping after walking a long distance but nothing limiting.  No pedal ulcerations/sores.   Labs (4/17): K 3.4, creatinine 1.6 Labs (5/17): K 4.4, creatinine 1.65, LFTs normal, TSH mildly elevated, free T3 and free T4 normal, BNP 502, hemoglobin 12.3 Labs (6/17): BNP 737, K 4.4, creatinine 1.72 Labs (7/17): LDL 136, K 4.9, creatinine 1.79  PMH: 1. CAD: Late presentation inferior MI in 3/17.  - LHC showed totally occluded pRCA with L=>R collaterals, 95% pLAD, 70% ramus.   - CABG with LIMA-LAD, SVG-D, SVG-ramus, SVG-RCA.  - Repeat echo in 3/17 after CABG showed patent LIMA and SVG-RCA but SVG-D and SVG- ramus had slow flow with small, diffusely diseased distal targets.  - Echo (6/17) with EF 40-45%, inferior  severe hypokinesis, mildly dilated RV with normal systolic function, PASP 30 mmHg.  2. Chronic systolic CHF: Ischemic cardiomyopathy.   - Echo (3/17) with EF 30-35%, septal and inferior AK.   3. RV failure: On Revatio. 4. CKD: Stage 3.  5. Atrial flutter: Transient, post-op.   6. VT: Post-op CABG.  7. Carotid stenosis: Right CEA 3/17.  8. PAD: Peripheral artery dopplers (7/17) with > 50% L CIA stenosis, occluded distal external iliac, occluded right SFA, occluded left SFA.   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   Family History  Problem Relation Age of Onset  . Heart disease Christopher [name]   . Failure to thrive Christopher [name]   . Congestive Heart Failure Christopher [name]     Deceased  . Breast cancer Christopher [name]     Living   ROS: All systems reviewed and negative except as per HPI.   Current Outpatient Prescriptions  Medication Sig Dispense Refill  . aspirin 81 MG tablet Take 1 tablet (81 mg total) by mouth daily. 30 tablet   . carvedilol (COREG) 6.25 MG tablet Take 1.5 tablets (9.375 mg total) by mouth 2 (two) times daily with a meal. 270 tablet 2  . gabapentin (NEURONTIN) 100 MG capsule Take 100 mg by mouth 3 (three) times daily as needed.    Marland Kitchen. HYDROcodone-acetaminophen (NORCO/VICODIN) 5-325 MG tablet Take 1 tablet by mouth every 6 (six) hours as needed for moderate pain.    Marland Kitchen. losartan (  COZAAR) 25 MG tablet Take 1 tablet (25 mg total) by mouth 2 (two) times daily. 180 tablet 2  . pantoprazole (PROTONIX) 40 MG tablet Take 1 tablet (40 mg total) by mouth daily. 90 tablet 2  . potassium chloride SA (K-DUR,KLOR-CON) 20 MEQ tablet Take 2 tablets (40 mEq total) by mouth daily. 180 tablet 2  . QUEtiapine (SEROQUEL) 50 MG tablet TAKE ONE-HALF TO ONE TABLET BY MOUTH AT BEDTIME FOR SLEEP/ANXIETY 30 tablet 0  . rosuvastatin (CRESTOR) 40 MG tablet Take 1 tablet (40 mg total) by mouth daily. 90 tablet 3  . senna-docusate (SENOKOT-S) 8.6-50 MG tablet Take 1 tablet by mouth at bedtime. For constipation 30  tablet 0  . sildenafil (REVATIO) 20 MG tablet TAKE TWO TABLETS BY MOUTH THREE TIMES DAILY 180 tablet 2  . spironolactone (ALDACTONE) 25 MG tablet Take 1 tablet (25 mg total) by mouth daily. 90 tablet 2  . NEOMYCIN-POLYMYXIN-HYDROCORTISONE (CORTISPORIN) 1 % SOLN otic solution Apply 1-2 drops to toe BID after soaking 10 mL 1  . torsemide (DEMADEX) 20 MG tablet Take 2 tabs every other day ALTERNATING with 1 tabs every other day 220 tablet 2   No current facility-administered medications for this encounter.    BP 102/66   Pulse 63   Wt 242 lb 8 oz (110 kg)   SpO2 100%   BMI 34.80 kg/m  General: NAD Neck: JVP 7 cm, no thyromegaly or thyroid nodule.  Lungs: Clear to auscultation bilaterally with normal respiratory effort. CV: Nondisplaced PMI.  Heart regular S1/S2, no S3/S4, no murmur. Trace ankle edema.  No carotid bruit.  Unable to palpate pedal pulses. Abdomen: Soft, nontender, no hepatosplenomegaly, no distention.  Skin: Intact without lesions or rashes.  Neurologic: Alert and oriented x 3.  Psych: Normal affect. Extremities: No clubbing or cyanosis.  HEENT: Normal.   Assessment/Plan: 1. CAD: s/p CABG.  No chest pain.   - Continue ASA 81 and Crestor.   2. Atrial flutter: Occurred only post-CABG and was transient.  He will need anticoagulation if afib or flutter recurs.  3. RV failure: Echo in 6/17 showed RV mildly dilated with normal systolic function.  At next appointment, will start weaning off Revaio.  4. Chronic systolic CHF: Ischemic cardiomyopathy.  Echo in 3/17 with EF 30-35%, improved to 40-45% on echo in 6/17.  NYHA class I-II symptoms.  Volume stable.  - Continue losartan 25 mg bid and spironolactone 25 daily.  - Increase Coreg to 9.375 mg bid.   - Continue current torsemide.  BMET/BNP today.  - EF > ICD threshold. 5. VT: He is on amiodarone, no recent events noted.  I think we can stop amiodarone with improvement in EF.  6. CKD: Stage 3.   7. Carotid stenosis: Right CEA  in 2017.  Followed by [facility].  He is on ASA and a statin.  8. Hyperlipidemia: Switched to Crestor with elevated LDL.  Will need lipids/LFTs in 10/17.  9. PAD: Extensive on peripheral dopplers but has only mild, non-lifestyle-limiting claudication.  Followed at [facility].   Followup in 3 months.  [name] 04/08/2016

## 2016-04-13 ENCOUNTER — Encounter: Payer: BLUE CROSS/BLUE SHIELD | Admitting: Cardiothoracic Surgery

## 2016-04-15 ENCOUNTER — Ambulatory Visit (INDEPENDENT_AMBULATORY_CARE_PROVIDER_SITE_OTHER): Payer: BLUE CROSS/BLUE SHIELD | Admitting: Cardiothoracic Surgery

## 2016-04-15 VITALS — BP 104/65 | HR 60 | Resp 20 | Ht 70.0 in | Wt 242.0 lb

## 2016-04-15 DIAGNOSIS — Z951 Presence of aortocoronary bypass graft: Secondary | ICD-10-CM

## 2016-04-15 NOTE — Progress Notes (Signed)
PCP is WEBB, Estill Batten, MD Referring Provider is Tonny Bollman, MD  Chief Complaint  Patient presents with  . Routine Post Op    6 week f/u HX of CABG    HID:[facility] office visit all 6 months after urgent multivessel CABG for acute MI, ischemic cardiomyopathy. The patient is progressing very well. Echocardiogram performed earlier this summer demonstrates ejection fraction of 40-45 percent. 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 He denies recurrent angina or symptoms of CHF. He is followed closely in the heart failure clinic and is compliant with his medications. He is in a sinus rhythm and his post MI V. tach has resolved. He denies any incisional pain  The patient has not smoked since discharge from the hospital Past Medical History:  Diagnosis Date  . Complete heart block (HCC) 10/20/2015  . Hypertension   . Stenosis of right carotid artery   . TIA (transient ischemic attack)     Past Surgical History:  Procedure Laterality Date  . arm surgery Right 2003  . CARDIAC CATHETERIZATION N/A 10/20/2015   Procedure: Left Heart Cath and Coronary Angiography;  Surgeon: Tonny Bollman, MD;  Location[facility] INVASIVE CV LAB;  Service: Cardiovascular;  Laterality: N/A;  . CARDIAC CATHETERIZATION N/A 11/02/2015   Procedure: Right/Left Heart Cath and Coronary/Graft Angiography;  Surgeon: Laurey Morale, MD;  Location: [facility] INVASIVE CV LAB;  Service: Cardiovascular;  Laterality: N/A;  . CORONARY ARTERY BYPASS GRAFT N/A 10/27/2015   Procedure: CORONARY ARTERY BYPASS GRAFTING (CABG) x four, using left internal mammary artery and rt leg greater saphenous vein harvested endoscopically;  Surgeon: Kerin Perna, MD;  Location: [facility] OR;  Service: Open Heart Surgery;  Laterality: N/A;  . ENDARTERECTOMY Right 10/27/2015   Procedure: RIGHT ENDARTERECTOMY CAROTID with patch angioplasty using Xenosure bovine pericardium patch;  Surgeon: Fransisco Hertz, MD;  Location: [facility] OR;  Service:  Vascular;  Laterality: Right;  . EYE SURGERY    . LACERATION REPAIR    . TEE WITHOUT CARDIOVERSION N/A 10/27/2015   Procedure: TRANSESOPHAGEAL ECHOCARDIOGRAM (TEE);  Surgeon: Kerin Perna, MD;  Location: [facility] OR;  Service: Open Heart Surgery;  Laterality: N/A;    Family History  Problem Relation Age of Onset  . Heart disease Mother   . Failure to thrive Father   . Congestive Heart Failure Father     Deceased  . Breast cancer Sister     Living    Social History Social History  Substance Use Topics  . Smoking status: Former Smoker    Quit date: 03/23/2002  . Smokeless tobacco: Never Used  . 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 aspirin 81 MG tablet Take 1 tablet (81 mg total) by mouth daily. 30 tablet   . carvedilol (COREG) 6.25 MG tablet Take 1.5 tablets (9.375 mg total) by mouth 2 (two) times daily with a meal. 270 tablet 2  . gabapentin (NEURONTIN) 100 MG capsule Take 100 mg by mouth 3 (three) times daily as needed.    Marland Kitchen losartan (COZAAR) 25 MG tablet Take 1 tablet (25 mg total) by mouth 2 (two) times daily. 180 tablet 2  . NEOMYCIN-POLYMYXIN-HYDROCORTISONE (CORTISPORIN) 1 % SOLN otic solution Apply 1-2 drops to toe BID after soaking 10 mL 1  . pantoprazole (PROTONIX) 40 MG tablet Take 1 tablet (40 mg total)  by mouth daily. 90 tablet 2  . potassium chloride SA (K-DUR,KLOR-CON) 20 MEQ tablet Take 2 tablets (40 mEq total) by mouth daily. 180 tablet 2  . QUEtiapine (SEROQUEL) 50 MG tablet TAKE ONE-HALF TO ONE TABLET BY MOUTH AT BEDTIME FOR SLEEP/ANXIETY 30 tablet 0  . rosuvastatin (CRESTOR) 40 MG tablet Take 1 tablet (40 mg total) by mouth daily. 90 tablet 3  . senna-docusate (SENOKOT-S) 8.6-50 MG tablet Take 1 tablet by mouth at bedtime. For constipation 30 tablet 0  . sildenafil (REVATIO) 20 MG tablet TAKE TWO TABLETS BY MOUTH THREE TIMES  DAILY 180 tablet 2  . spironolactone (ALDACTONE) 25 MG tablet Take 1 tablet (25 mg total) by mouth daily. 90 tablet 2  . torsemide (DEMADEX) 20 MG tablet Take 2 tabs every other day ALTERNATING with 1 tabs every other day 220 tablet 2   No current facility-administered medications for this visit.     Allergies  Allergen Reactions  . Losartan Shortness Of Breath  . Elita QuickFortaz [Ceftazidime] Rash    Rash with blisters  . Lisinopril Rash    Review of Systems  No angina or edema No cough or shortness of breath No change in vision or presyncope Surgical incisions are healing without drainage No ankle edema  BP 104/65   Pulse 60   Resp 20   Ht 5\' 10"  (1.778 m)   Wt 242 lb (109.8 kg)   SpO2 98% Comment: RA  BMI 34.72 kg/m  Physical Exam      Exam    General- alert and comfortable   Lungs- clear without rales, wheezes   Cor- regular rate and rhythm, no murmur , gallop   Abdomen- soft, non-tender   Extremities - warm, non-tender, minimal edema   Neuro- oriented, appropriate, no focal weakness   Diagnostic Tests: Last echo shows ejection fraction 40-45 percent without MR  Impression: Good recovery after urgent multivessel CABG with combined carotid endarterectomy and long postoperative recovery  Plan: Return as needed for any future surgical issues We discussed the importance of heart healthy diet, heart healthy activity on a daily basis, continued tobacco abstinence and compliance with medications  Mikey BussingPeter Van Trigt III, MD [facility] [phone]

## 2016-04-19 ENCOUNTER — Encounter: Payer: Self-pay | Admitting: Podiatry

## 2016-04-19 ENCOUNTER — Ambulatory Visit (INDEPENDENT_AMBULATORY_CARE_PROVIDER_SITE_OTHER): Payer: BLUE CROSS/BLUE SHIELD | Admitting: Podiatry

## 2016-04-19 DIAGNOSIS — L600 Ingrowing nail: Secondary | ICD-10-CM

## 2016-04-19 NOTE — Patient Instructions (Signed)

## 2016-04-19 NOTE — Progress Notes (Signed)
He presents today for follow-up of his matrixectomy tibial and fibular border of the hallux right and of the fibular border hallux left he states that he continues to soak in a regular basis and has had no pain with them whatsoever.  Objective: Vital signs are stable he is alert and oriented 3. Pulses are palpable. His feet are stained from soaking in Betadine. His toes appear to be healing well that he does have some discoloration and ecchymosis around the tibial and fibular border of the hallux right. This is more than likely due to the fact that they were infected at the time of the procedure.  Assessment: Well-healing surgical toes bilateral.  Plan: Discontinue Betadine spell with Epsom salts and water soaks continue to soak daily until completely healed. I recommended that he cover his toenails with a Band-Aid during the day but leave open at bedtime. Follow up with [place] with any questions or concerns.

## 2016-04-21 ENCOUNTER — Other Ambulatory Visit: Payer: BLUE CROSS/BLUE SHIELD

## 2016-05-20 ENCOUNTER — Telehealth (HOSPITAL_COMMUNITY): Payer: Self-pay | Admitting: *Deleted

## 2016-05-20 NOTE — Telephone Encounter (Signed)
Pt left a voice message stating Revatio was causing some side effects including a constant headache. He wanted to speak with someone in detail about the side effects and if they were truly coming from revatio.    Message routed to [facility]

## 2016-05-20 NOTE — Telephone Encounter (Signed)
Mr. Warne stated that he has had a dull headache most days since starting Revatio a few months ago and was wondering if this was a common side effect of this medication. We did discuss that this is a listed side effect of Revatio in 16-46% of patients taking this medication. He did state that he tried taking APAP yesterday and it did resolve his headache. I have recommended that he continue to do this for now as, per Dr. Alford Highland last note, he will be titrated off of the medication anyway over the next few months. Patient was agreeable to this plan.   [name]. Bonnye Fava, PharmD, BCPS, CPP Clinical Pharmacist Pager: [phone] Phone: [phone] 05/20/2016 2:17 PM

## 2016-05-23 ENCOUNTER — Ambulatory Visit (HOSPITAL_COMMUNITY)
Admission: RE | Admit: 2016-05-23 | Discharge: 2016-05-23 | Disposition: A | Discharge status: Home / Self Care | Payer: BLUE CROSS/BLUE SHIELD | Source: Ambulatory Visit | Attending: Cardiology | Admitting: Cardiology

## 2016-05-23 DIAGNOSIS — I509 Heart failure, unspecified: Secondary | ICD-10-CM | POA: Diagnosis not present

## 2016-05-23 LAB — HEPATIC FUNCTION PANEL
ALK PHOS: 78 U/L (ref 38–126)
ALT: 17 U/L (ref 17–63)
AST: 17 U/L (ref 15–41)
Albumin: 4.3 g/dL (ref 3.5–5.0)
BILIRUBIN INDIRECT: 0.6 mg/dL (ref 0.3–0.9)
BILIRUBIN TOTAL: 0.8 mg/dL (ref 0.3–1.2)
Bilirubin, Direct: 0.2 mg/dL (ref 0.1–0.5)
Total Protein: 7.4 g/dL (ref 6.5–8.1)

## 2016-05-23 LAB — LIPID PANEL
CHOL/HDL RATIO: 4.3 ratio
CHOLESTEROL: 146 mg/dL (ref 0–200)
HDL: 34 mg/dL — ABNORMAL LOW (ref >40)
LDL CALC: 74 mg/dL (ref 0–99)
Triglycerides: 192 mg/dL — ABNORMAL HIGH (ref <150)
VLDL: 38 mg/dL (ref 0–40)

## 2016-06-29 ENCOUNTER — Telehealth (HOSPITAL_COMMUNITY): Payer: Self-pay | Admitting: Pharmacist

## 2016-06-29 NOTE — Telephone Encounter (Signed)
Christopher [name] called stating that Christopher [name] no longer has insurance and his sildenafil is very expensive. She stated that Dr. Shirlee Latch had planned on titrating him off of the sildenafil anyway and was wondering if he could discontinue at this point. Per Dr. Shirlee Latch, he can discontinue use at this time. I did instruct them to give [place] a call of he becomes very symptomatic or SOB after discontinuation. Wife was agreeable.    [name]. Bonnye Fava, PharmD, BCPS, CPP Clinical Pharmacist Pager: [phone] Phone: [phone] 06/29/2016 3:17 PM

## 2016-07-06 ENCOUNTER — Encounter: Payer: Self-pay | Admitting: Vascular Surgery

## 2016-07-08 ENCOUNTER — Ambulatory Visit: Payer: BLUE CROSS/BLUE SHIELD | Admitting: Vascular Surgery

## 2016-07-08 ENCOUNTER — Encounter (HOSPITAL_COMMUNITY): Payer: BLUE CROSS/BLUE SHIELD

## 2016-08-16 ENCOUNTER — Encounter (HOSPITAL_COMMUNITY): Payer: Self-pay

## 2016-08-16 ENCOUNTER — Ambulatory Visit (HOSPITAL_COMMUNITY)
Admission: RE | Admit: 2016-08-16 | Discharge: 2016-08-16 | Disposition: A | Discharge status: Home / Self Care | Payer: Self-pay | Source: Ambulatory Visit | Attending: Cardiology | Admitting: Cardiology

## 2016-08-16 VITALS — BP 146/86 | HR 87 | Wt 270.5 lb

## 2016-08-16 DIAGNOSIS — I5022 Chronic systolic (congestive) heart failure: Secondary | ICD-10-CM

## 2016-08-16 DIAGNOSIS — Z9889 Other specified postprocedural states: Secondary | ICD-10-CM | POA: Insufficient documentation

## 2016-08-16 DIAGNOSIS — E785 Hyperlipidemia, unspecified: Secondary | ICD-10-CM | POA: Insufficient documentation

## 2016-08-16 DIAGNOSIS — I255 Ischemic cardiomyopathy: Secondary | ICD-10-CM | POA: Insufficient documentation

## 2016-08-16 DIAGNOSIS — G629 Polyneuropathy, unspecified: Secondary | ICD-10-CM | POA: Insufficient documentation

## 2016-08-16 DIAGNOSIS — Z803 Family history of malignant neoplasm of breast: Secondary | ICD-10-CM | POA: Insufficient documentation

## 2016-08-16 DIAGNOSIS — Z79899 Other long term (current) drug therapy: Secondary | ICD-10-CM | POA: Insufficient documentation

## 2016-08-16 DIAGNOSIS — Z7982 Long term (current) use of aspirin: Secondary | ICD-10-CM | POA: Insufficient documentation

## 2016-08-16 DIAGNOSIS — I4892 Unspecified atrial flutter: Secondary | ICD-10-CM | POA: Insufficient documentation

## 2016-08-16 DIAGNOSIS — Z951 Presence of aortocoronary bypass graft: Secondary | ICD-10-CM | POA: Insufficient documentation

## 2016-08-16 DIAGNOSIS — I739 Peripheral vascular disease, unspecified: Secondary | ICD-10-CM

## 2016-08-16 DIAGNOSIS — N183 Chronic kidney disease, stage 3 (moderate): Secondary | ICD-10-CM | POA: Insufficient documentation

## 2016-08-16 DIAGNOSIS — Z8249 Family history of ischemic heart disease and other diseases of the circulatory system: Secondary | ICD-10-CM | POA: Insufficient documentation

## 2016-08-16 DIAGNOSIS — I252 Old myocardial infarction: Secondary | ICD-10-CM | POA: Insufficient documentation

## 2016-08-16 DIAGNOSIS — I2510 Atherosclerotic heart disease of native coronary artery without angina pectoris: Secondary | ICD-10-CM

## 2016-08-16 DIAGNOSIS — I6521 Occlusion and stenosis of right carotid artery: Secondary | ICD-10-CM | POA: Insufficient documentation

## 2016-08-16 LAB — BASIC METABOLIC PANEL
ANION GAP: 6 (ref 5–15)
BUN: 17 mg/dL (ref 6–20)
CALCIUM: 9.7 mg/dL (ref 8.9–10.3)
CO2: 29 mmol/L (ref 22–32)
Chloride: 102 mmol/L (ref 101–111)
Creatinine, Ser: 1.67 mg/dL — ABNORMAL HIGH (ref 0.61–1.24)
GFR calc Af Amer: 52 mL/min — ABNORMAL LOW (ref >60)
GFR, EST NON AFRICAN AMERICAN: 45 mL/min — AB (ref >60)
GLUCOSE: 116 mg/dL — AB (ref 65–99)
Potassium: 5 mmol/L (ref 3.5–5.1)
Sodium: 137 mmol/L (ref 135–145)

## 2016-08-16 LAB — BRAIN NATRIURETIC PEPTIDE: B Natriuretic Peptide: 274.1 pg/mL — ABNORMAL HIGH (ref 0.0–100.0)

## 2016-08-16 MED ORDER — CARVEDILOL 12.5 MG PO TABS: 12.5000 mg | ORAL_TABLET | Freq: Two times a day (BID) | ORAL | 3 refills | Status: DC

## 2016-08-16 NOTE — Patient Instructions (Signed)
Increase Carvedilol to 12.5 mg Twice daily   Labs today  Your physician recommends that you schedule a follow-up appointment in: 3 months  

## 2016-08-16 NOTE — Progress Notes (Signed)
Patient ID: Christopher [name], male   DOB: [birth year], 56 y.o.   MRN: [id number] PCP: Dr. Hyman Hopes Cardiology: Dr. Shirlee Latch  56 yo with history of CAD s/p CABG, ischemic cardiomyopathy, and CKD presents for cardiology followup.  In 3/17, he came to the hospital with a late presentation inferior MI.  He was noted to have occluded RCA with collaterals and 95% proximal LAD.  He had CABG x 4 + right CEA.  He had a balloon pump with cardiogenic shock.  He additionally had multiple episodes of post-op VT/VF.  Amiodarone was started.  Repeat cath showed slow flow in the SVG-D and SVG-ramus given diffusely diseased and small distal targets.  He developed AKI.  He had prolonged mechanical ventilation and a tracheostomy.  He had RV failure that was treated with Revatio.  He had transient atrial flutter that resolved quickly.  Last echo in 3/17 showed EF 30-35%.  Finally, the tracheostomy was removed.  He went to inpatient rehab for about a week and is now home again.  Echo 6/17 with EF 40-45%, Lifevest now off.   Since last appointment, he lost his job.  Very fatigued with long hours and heavy lifting.  Now trying to decide on applying to additional jobs or applying for disability.  No dyspnea walking on flat ground or going up a flight of steps.  Dyspnea only with heavy exertion.  No chest pain.  Weight is up significantly.  He attributes this to depression and eating more.  Stable claudication with walking long distances.   ECG: NSR, left axis deviation, old inferior and anterolateral MIs  Labs (4/17): K 3.4, creatinine 1.6 Labs (5/17): K 4.4, creatinine 1.65, LFTs normal, TSH mildly elevated, free T3 and free T4 normal, BNP 502, hemoglobin 12.3 Labs (6/17): BNP 737, K 4.4, creatinine 1.72 Labs (7/17): LDL 136, K 4.9, creatinine 1.79 Labs (8/17): BNP 239, K 3.8, creatinine 2.17 Labs (10/17): LDL 74, HDL 34, LFTs normal  PMH: 1. CAD: Late presentation inferior MI in 3/17.  - LHC showed totally occluded pRCA with  L=>R collaterals, 95% pLAD, 70% ramus.   - CABG with LIMA-LAD, SVG-D, SVG-ramus, SVG-RCA.  - Repeat echo in 3/17 after CABG showed patent LIMA and SVG-RCA but SVG-D and SVG- ramus had slow flow with small, diffusely diseased distal targets.  - Echo (6/17) with EF 40-45%, inferior severe hypokinesis, mildly dilated RV with normal systolic function, PASP 30 mmHg.  2. Chronic systolic CHF: Ischemic cardiomyopathy.   - Echo (3/17) with EF 30-35%, septal and inferior AK.   3. RV failure: was on Revatio, now off.  4. CKD: Stage 3.  5. Atrial flutter: Transient, post-op.   6. VT: Post-op CABG.  7. Carotid stenosis: Right CEA 3/17.  8. PAD: Peripheral artery dopplers (7/17) with > 50% L CIA stenosis, occluded distal external iliac, occluded right SFA, occluded left SFA.  9. 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   Family History  Problem Relation Age of Onset  . Heart disease Mother   . Failure to thrive Father   . Congestive Heart Failure Father     Deceased  . Breast cancer Sister     Living   ROS: All systems reviewed and negative except as per HPI.   Current Outpatient Prescriptions  Medication Sig Dispense Refill  . aspirin 81 MG tablet Take 1 tablet (81 mg total) by mouth daily. 30 tablet   . carvedilol (COREG) 12.5 MG tablet Take  1 tablet (12.5 mg total) by mouth 2 (two) times daily with a meal. 60 tablet 3  . gabapentin (NEURONTIN) 100 MG capsule Take 100 mg by mouth 3 (three) times daily as needed.    Marland Kitchen losartan (COZAAR) 25 MG tablet Take 1 tablet (25 mg total) by mouth 2 (two) times daily. 180 tablet 2  . NEOMYCIN-POLYMYXIN-HYDROCORTISONE (CORTISPORIN) 1 % SOLN otic solution Apply 1-2 drops to toe BID after soaking 10 mL 1  . pantoprazole (PROTONIX) 40 MG tablet Take 1 tablet (40 mg total) by mouth daily. 90 tablet 2  . potassium chloride SA (K-DUR,KLOR-CON) 20 MEQ tablet Take 2 tablets (40 mEq total) by mouth daily.  180 tablet 2  . QUEtiapine (SEROQUEL) 50 MG tablet TAKE ONE-HALF TO ONE TABLET BY MOUTH AT BEDTIME FOR SLEEP/ANXIETY 30 tablet 0  . rosuvastatin (CRESTOR) 40 MG tablet Take 1 tablet (40 mg total) by mouth daily. 90 tablet 3  . senna-docusate (SENOKOT-S) 8.6-50 MG tablet Take 1 tablet by mouth at bedtime. For constipation 30 tablet 0  . spironolactone (ALDACTONE) 25 MG tablet Take 1 tablet (25 mg total) by mouth daily. 90 tablet 2  . torsemide (DEMADEX) 20 MG tablet Take 2 tabs every other day ALTERNATING with 1 tabs every other day 220 tablet 2  . traMADol (ULTRAM) 50 MG tablet Take 50 mg by mouth every 6 (six) hours as needed.     No current facility-administered medications for this encounter.    BP (!) 146/86   Pulse 87   Wt 270 lb 8 oz (122.7 kg)   SpO2 98%   BMI 38.81 kg/m  General: NAD Neck: Thick neck, JVP 7 cm, no thyromegaly or thyroid nodule.  Lungs: Clear to auscultation bilaterally with normal respiratory effort. CV: Nondisplaced PMI.  Heart regular S1/S2, no S3/S4, no murmur. Trace ankle edema.  No carotid bruit.  Unable to palpate pedal pulses. Abdomen: Soft, nontender, no hepatosplenomegaly, no distention.  Skin: Intact without lesions or rashes.  Neurologic: Alert and oriented x 3.  Psych: Normal affect. Extremities: No clubbing or cyanosis.  HEENT: Normal.   Assessment/Plan: 1. CAD: s/p CABG.  No chest pain.   - Continue ASA 81 and Crestor.   2. Atrial flutter: Occurred only post-CABG and was transient.  He will need anticoagulation if afib or flutter recurs.  NSR on today's ECG.  3. RV failure: Echo in 6/17 showed RV mildly dilated with normal systolic function.  He is now off Revatio. 4. Chronic systolic CHF: Ischemic cardiomyopathy.  Echo in 3/17 with EF 30-35%, improved to 40-45% on echo in 6/17.  NYHA class II symptoms.  Volume stable.  - Continue losartan 25 mg bid and spironolactone 25 daily.  - Increase Coreg to 12.5 mg bid.   - Continue current torsemide.   BMET/BNP today.  I think that his weight gain is due to caloric intake/lack of exercise and not fluid. We discussed increased activity.  - EF > ICD threshold. 5. VT: Now off amiodarone.   6. CKD: Stage 3.   7. Carotid stenosis: Right CEA in 2017.  Followed by [facility].  He is on ASA and a statin.  8. Hyperlipidemia: Good lipids on Crestor.   9. PAD: Extensive on peripheral dopplers but has only non-lifestyle-limiting claudication.  Followed at [facility].   Followup in 3 months.  [name] 08/16/2016

## 2016-08-26 ENCOUNTER — Other Ambulatory Visit (HOSPITAL_COMMUNITY): Payer: Self-pay | Admitting: *Deleted

## 2016-08-26 MED ORDER — TORSEMIDE 20 MG PO TABS: ORAL_TABLET | ORAL | 3 refills | Status: DC

## 2016-09-16 DIAGNOSIS — Z0279 Encounter for issue of other medical certificate: Secondary | ICD-10-CM | POA: Diagnosis not present

## 2016-09-23 ENCOUNTER — Other Ambulatory Visit (HOSPITAL_COMMUNITY): Payer: Self-pay | Admitting: Cardiology

## 2016-09-27 ENCOUNTER — Other Ambulatory Visit: Payer: Self-pay | Admitting: Physical Medicine and Rehabilitation

## 2016-09-29 ENCOUNTER — Other Ambulatory Visit (HOSPITAL_COMMUNITY): Payer: Self-pay | Admitting: Cardiology

## 2016-10-11 ENCOUNTER — Other Ambulatory Visit: Payer: Self-pay | Admitting: Cardiology

## 2016-10-11 ENCOUNTER — Other Ambulatory Visit: Payer: Self-pay | Admitting: Physical Medicine and Rehabilitation

## 2016-10-23 ENCOUNTER — Other Ambulatory Visit: Payer: Self-pay | Admitting: Cardiology

## 2016-11-14 DIAGNOSIS — Z0279 Encounter for issue of other medical certificate: Secondary | ICD-10-CM | POA: Diagnosis not present

## 2016-12-06 ENCOUNTER — Telehealth: Payer: Self-pay | Admitting: Licensed Clinical Social Worker

## 2016-12-06 ENCOUNTER — Ambulatory Visit (HOSPITAL_COMMUNITY)
Admission: RE | Admit: 2016-12-06 | Discharge: 2016-12-06 | Disposition: A | Discharge status: Home / Self Care | Payer: Medicaid Other | Source: Ambulatory Visit | Attending: Cardiology | Admitting: Cardiology

## 2016-12-06 ENCOUNTER — Encounter (HOSPITAL_COMMUNITY): Payer: Self-pay

## 2016-12-06 VITALS — BP 131/86 | HR 61 | Wt 278.5 lb

## 2016-12-06 DIAGNOSIS — N183 Chronic kidney disease, stage 3 (moderate): Secondary | ICD-10-CM | POA: Insufficient documentation

## 2016-12-06 DIAGNOSIS — G629 Polyneuropathy, unspecified: Secondary | ICD-10-CM | POA: Diagnosis not present

## 2016-12-06 DIAGNOSIS — I2510 Atherosclerotic heart disease of native coronary artery without angina pectoris: Secondary | ICD-10-CM | POA: Insufficient documentation

## 2016-12-06 DIAGNOSIS — I5022 Chronic systolic (congestive) heart failure: Secondary | ICD-10-CM

## 2016-12-06 DIAGNOSIS — I255 Ischemic cardiomyopathy: Secondary | ICD-10-CM | POA: Diagnosis not present

## 2016-12-06 DIAGNOSIS — Z7982 Long term (current) use of aspirin: Secondary | ICD-10-CM | POA: Diagnosis not present

## 2016-12-06 DIAGNOSIS — Z8249 Family history of ischemic heart disease and other diseases of the circulatory system: Secondary | ICD-10-CM | POA: Insufficient documentation

## 2016-12-06 DIAGNOSIS — I4892 Unspecified atrial flutter: Secondary | ICD-10-CM | POA: Diagnosis not present

## 2016-12-06 DIAGNOSIS — Z79899 Other long term (current) drug therapy: Secondary | ICD-10-CM | POA: Diagnosis not present

## 2016-12-06 DIAGNOSIS — I739 Peripheral vascular disease, unspecified: Secondary | ICD-10-CM | POA: Insufficient documentation

## 2016-12-06 DIAGNOSIS — Z951 Presence of aortocoronary bypass graft: Secondary | ICD-10-CM

## 2016-12-06 DIAGNOSIS — Z803 Family history of malignant neoplasm of breast: Secondary | ICD-10-CM | POA: Diagnosis not present

## 2016-12-06 DIAGNOSIS — I252 Old myocardial infarction: Secondary | ICD-10-CM | POA: Diagnosis not present

## 2016-12-06 DIAGNOSIS — E785 Hyperlipidemia, unspecified: Secondary | ICD-10-CM | POA: Diagnosis not present

## 2016-12-06 LAB — BASIC METABOLIC PANEL
Anion gap: 7 (ref 5–15)
BUN: 32 mg/dL — AB (ref 6–20)
CHLORIDE: 104 mmol/L (ref 101–111)
CO2: 24 mmol/L (ref 22–32)
Calcium: 9.6 mg/dL (ref 8.9–10.3)
Creatinine, Ser: 2 mg/dL — ABNORMAL HIGH (ref 0.61–1.24)
GFR calc Af Amer: 42 mL/min — ABNORMAL LOW (ref >60)
GFR calc non Af Amer: 36 mL/min — ABNORMAL LOW (ref >60)
GLUCOSE: 144 mg/dL — AB (ref 65–99)
POTASSIUM: 5 mmol/L (ref 3.5–5.1)
Sodium: 135 mmol/L (ref 135–145)

## 2016-12-06 LAB — BRAIN NATRIURETIC PEPTIDE: B Natriuretic Peptide: 309.3 pg/mL — ABNORMAL HIGH (ref 0.0–100.0)

## 2016-12-06 MED ORDER — SPIRONOLACTONE 25 MG PO TABS: 25.0000 mg | ORAL_TABLET | Freq: Every day | ORAL | 2 refills | Status: DC

## 2016-12-06 MED ORDER — LOSARTAN POTASSIUM 25 MG PO TABS: 25.0000 mg | ORAL_TABLET | Freq: Two times a day (BID) | ORAL | 3 refills | Status: DC

## 2016-12-06 MED ORDER — CARVEDILOL 12.5 MG PO TABS: 18.7500 mg | ORAL_TABLET | Freq: Two times a day (BID) | ORAL | 3 refills | Status: DC

## 2016-12-06 MED ORDER — POTASSIUM CHLORIDE CRYS ER 20 MEQ PO TBCR: 40.0000 meq | EXTENDED_RELEASE_TABLET | Freq: Every day | ORAL | 2 refills | Status: DC

## 2016-12-06 MED ORDER — TORSEMIDE 20 MG PO TABS: 40.0000 mg | ORAL_TABLET | Freq: Every day | ORAL | 3 refills | Status: DC

## 2016-12-06 MED FILL — LOSARTAN POTASSIUM 25 MG TA: 25 | 30 days supply | Qty: 60 | Fill #0

## 2016-12-06 MED FILL — CARVEDILOL 12.5 MG TABLET: 12.5 | 30 days supply | Qty: 90 | Fill #0

## 2016-12-06 MED FILL — SPIRONOLACTONE 25 MG TABLET: 25 | 30 days supply | Qty: 30 | Fill #0

## 2016-12-06 MED FILL — POTASSIUM CL ER 20 MEQ TABL: 20 | 30 days supply | Qty: 60 | Fill #0

## 2016-12-06 MED FILL — TORSEMIDE 20 MG TABLET: 20 | 30 days supply | Qty: 60 | Fill #0

## 2016-12-06 NOTE — Patient Instructions (Signed)
Increase Carvedilol to 18.75 mg (1 & 1/2 tabs) Twice daily   Increase Torsemide to 40 mg (2 tabs) daily  Labs today  Labs in 10 days  Your physician recommends that you schedule a follow-up appointment in: 2 months

## 2016-12-06 NOTE — Telephone Encounter (Signed)
CSW referred to assist with disability/insurance options. CSW contacted patient who states he has applied for disability and for medicaid. Patient is not married and states he recently became disabled and unable to work. Patient was very grateful to the clinic staff for the assistance received today for his medications. CSW discussed disability process and encouraged patient to continue to complete needed paperwork and follow up with scheduled appointments.  CSW also suggested application for the [facility] card to help bridge the gap until disability/medicaid are resolved. Patient verbalizes understanding and will follow up as needed. CSW available as needed. Lasandra Beech, LCSW, CCSW-MCS [phone]

## 2016-12-07 ENCOUNTER — Telehealth (HOSPITAL_COMMUNITY): Payer: Self-pay | Admitting: *Deleted

## 2016-12-07 MED ORDER — LOSARTAN POTASSIUM 25 MG PO TABS: 25.0000 mg | ORAL_TABLET | Freq: Every day | ORAL | 3 refills | Status: DC

## 2016-12-07 NOTE — Telephone Encounter (Signed)
Notes recorded by Modesta Messing, CMA on 12/07/2016 at 2:15 PM EDT Pt aware of lab results. ------  Notes recorded by Laurey Morale, MD on 12/07/2016 at 12:21 PM EDT Stop any supplemental K. Decrease losartan to once a day rather than bid. Repeat BMET in 1 week.    Ref Range & Units 1d ago 21mo ago 15mo ago   Sodium 135 - 145 mmol/L 135  137  133     Potassium 3.5 - 5.1 mmol/L 5.0  5.0  3.8    Chloride 101 - 111 mmol/L 104  102  101    CO2 22 - 32 mmol/L 24  29  23     Glucose, Bld 65 - 99 mg/dL 332   951   72    BUN 6 - 20 mg/dL 32   17  45     Creatinine, Ser 0.61 - 1.24 mg/dL 8.84   1.66   0.63     Calcium 8.9 - 10.3 mg/dL 9.6  9.7  9.4    GFR calc non Af Amer >60 mL/min 36   45   33     GFR calc Af Amer >60 mL/min 42   52CM   38CM

## 2016-12-07 NOTE — Progress Notes (Signed)
Patient ID: Christopher [name], male   DOB: [birth year], 56 y.o.   MRN: [id number] PCP: Dr. Hyman Hopes Cardiology: Dr. Shirlee Latch  56 yo with history of CAD s/p CABG, ischemic cardiomyopathy, and CKD presents for cardiology followup.  In 3/17, he came to the hospital with a late presentation inferior MI.  He was noted to have occluded RCA with collaterals and 95% proximal LAD.  He had CABG x 4 + right CEA.  He had a balloon pump with cardiogenic shock.  He additionally had multiple episodes of post-op VT/VF.  Amiodarone was started.  Repeat cath showed slow flow in the SVG-D and SVG-ramus given diffusely diseased and small distal targets.  He developed AKI.  He had prolonged mechanical ventilation and a tracheostomy.  He had RV failure that was treated with Revatio.  He had transient atrial flutter that resolved quickly.  Last echo in 3/17 showed EF 30-35%.  Finally, the tracheostomy was removed.  He went to inpatient rehab for about a week and is now home again.  Echo 6/17 with EF 40-45%, [employer] now off.   He has lost his job and is working on disability.  No dyspnea walking on flat ground or going up a flight of steps.  Dyspnea only with heavy exertion.  He tires easily with yardwork like raking. No chest pain.  Weight is up 8 lbs.  Stable claudication with walking long distances.   Labs (4/17): K 3.4, creatinine 1.6 Labs (5/17): K 4.4, creatinine 1.65, LFTs normal, TSH mildly elevated, free T3 and free T4 normal, BNP 502, hemoglobin 12.3 Labs (6/17): BNP 737, K 4.4, creatinine 1.72 Labs (7/17): LDL 136, K 4.9, creatinine 1.79 Labs (8/17): BNP 239, K 3.8, creatinine 2.17 Labs (10/17): LDL 74, HDL 34, LFTs normal Labs (1/19): K 5, creatinine 1.67, BNP 774  PMH: 1. CAD: Late presentation inferior MI in 3/17.  - LHC showed totally occluded pRCA with L=>R collaterals, 95% pLAD, 70% ramus.   - CABG with LIMA-LAD, SVG-D, SVG-ramus, SVG-RCA.  - Repeat echo in 3/17 after CABG showed patent LIMA and SVG-RCA but  SVG-D and SVG- ramus had slow flow with small, diffusely diseased distal targets.  - Echo (6/17) with EF 40-45%, inferior severe hypokinesis, mildly dilated RV with normal systolic function, PASP 30 mmHg.  2. Chronic systolic CHF: Ischemic cardiomyopathy.   - Echo (3/17) with EF 30-35%, septal and inferior AK.   3. RV failure: was on Revatio, now off.  4. CKD: Stage 3.  5. Atrial flutter: Transient, post-op.   6. VT: Post-op CABG.  7. Carotid stenosis: Right CEA 3/17.  8. PAD: Peripheral artery dopplers (7/17) with > 50% L CIA stenosis, occluded distal external iliac, occluded right SFA, occluded left SFA.  9. 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   Family History  Problem Relation Age of Onset  . Heart disease Mother   . Failure to thrive Father   . Congestive Heart Failure Father     Deceased  . Breast cancer Sister     Living   ROS: All systems reviewed and negative except as per HPI.   Current Outpatient Prescriptions  Medication Sig Dispense Refill  . aspirin 81 MG tablet Take 1 tablet (81 mg total) by mouth daily. 30 tablet   . carvedilol (COREG) 12.5 MG tablet Take 1.5 tablets (18.75 mg total) by mouth 2 (two) times daily with a meal. 60 tablet 3  . gabapentin (NEURONTIN) 100 MG capsule  Take 100 mg by mouth 3 (three) times daily as needed.    . NEOMYCIN-POLYMYXIN-HYDROCORTISONE (CORTISPORIN) 1 % SOLN otic solution Apply 1-2 drops to toe BID after soaking 10 mL 1  . pantoprazole (PROTONIX) 40 MG tablet Take 1 tablet (40 mg total) by mouth daily. 90 tablet 2  . QUEtiapine (SEROQUEL) 50 MG tablet TAKE ONE-HALF TO ONE TABLET BY MOUTH AT BEDTIME FOR SLEEP/ANXIETY 30 tablet 0  . rosuvastatin (CRESTOR) 40 MG tablet Take 1 tablet (40 mg total) by mouth daily. 90 tablet 3  . senna-docusate (SENOKOT-S) 8.6-50 MG tablet Take 1 tablet by mouth at bedtime. For constipation 30 tablet 0  . spironolactone (ALDACTONE) 25 MG  tablet Take 1 tablet (25 mg total) by mouth daily. 90 tablet 2  . torsemide (DEMADEX) 20 MG tablet Take 2 tablets (40 mg total) by mouth daily. 60 tablet 3  . traMADol (ULTRAM) 50 MG tablet Take 50 mg by mouth every 6 (six) hours as needed.    Marland Kitchen losartan (COZAAR) 25 MG tablet Take 1 tablet (25 mg total) by mouth daily. 90 tablet 3   No current facility-administered medications for this encounter.    BP 131/86   Pulse 61   Wt 278 lb 8 oz (126.3 kg)   SpO2 99%   BMI 39.96 kg/m  General: NAD, obese.  Neck: Thick neck, JVP 8-9 cm, no thyromegaly or thyroid nodule.  Lungs: Clear to auscultation bilaterally with normal respiratory effort. CV: Nondisplaced PMI.  Heart regular S1/S2, no S3/S4, no murmur. 1+ ankle edema.  No carotid bruit.  Unable to palpate pedal pulses. Abdomen: Soft, nontender, no hepatosplenomegaly, no distention.  Skin: Intact without lesions or rashes.  Neurologic: Alert and oriented x 3.  Psych: Normal affect. Extremities: No clubbing or cyanosis.  HEENT: Normal.   Assessment/Plan: 1. CAD: s/p CABG.  No chest pain.   - Continue ASA 81 and Crestor.   2. Atrial flutter: Occurred only post-CABG and was transient.  He will need anticoagulation if afib or flutter recurs.  NSR today.  3. RV failure: Echo in 6/17 showed RV mildly dilated with normal systolic function.  He is now off Revatio. 4. Chronic systolic CHF: Ischemic cardiomyopathy.  Echo in 3/17 with EF 30-35%, improved to 40-45% on echo in 6/17.  NYHA class II symptoms.  He is volume overloaded on exam.   - Continue losartan 25 mg bid and spironolactone 25 daily.  - Increase Coreg to 18.75 mg bid.   - Increase torsemide to 40 mg daily.  BMET/BNP today, repeat BMET in 10 days.                                       - EF > ICD threshold. - Repeat echo in 6/18.  5. VT: Now off amiodarone.   6. CKD: Stage 3.   7. Carotid stenosis: Right CEA in 2017.  Followed by [facility].  He is on ASA and a statin.  8. Hyperlipidemia:  Good lipids on Crestor.   9. PAD: Extensive on peripheral dopplers but has only non-lifestyle-limiting claudication.  Followed at [facility].   I will have our social worker help him with insurance and disability issues.   [name] 12/07/2016

## 2016-12-07 NOTE — Telephone Encounter (Signed)
-----   Message from Laurey Morale, MD sent at 12/07/2016 12:21 PM EDT ----- Stop any supplemental K.  Decrease losartan to once a day rather than bid.  Repeat BMET in 1 week.

## 2016-12-16 ENCOUNTER — Other Ambulatory Visit (HOSPITAL_COMMUNITY): Payer: PRIVATE HEALTH INSURANCE

## 2016-12-20 ENCOUNTER — Other Ambulatory Visit (HOSPITAL_COMMUNITY): Payer: Self-pay | Admitting: *Deleted

## 2016-12-20 ENCOUNTER — Ambulatory Visit (HOSPITAL_COMMUNITY)
Admission: RE | Admit: 2016-12-20 | Discharge: 2016-12-20 | Disposition: A | Discharge status: Home / Self Care | Payer: Medicaid Other | Source: Ambulatory Visit | Attending: Cardiology | Admitting: Cardiology

## 2016-12-20 DIAGNOSIS — I5022 Chronic systolic (congestive) heart failure: Secondary | ICD-10-CM

## 2016-12-20 LAB — BASIC METABOLIC PANEL
ANION GAP: 10 (ref 5–15)
BUN: 34 mg/dL — ABNORMAL HIGH (ref 6–20)
CALCIUM: 9.4 mg/dL (ref 8.9–10.3)
CO2: 25 mmol/L (ref 22–32)
Chloride: 102 mmol/L (ref 101–111)
Creatinine, Ser: 2.18 mg/dL — ABNORMAL HIGH (ref 0.61–1.24)
GFR, EST AFRICAN AMERICAN: 37 mL/min — AB (ref >60)
GFR, EST NON AFRICAN AMERICAN: 32 mL/min — AB (ref >60)
Glucose, Bld: 208 mg/dL — ABNORMAL HIGH (ref 65–99)
Potassium: 3.8 mmol/L (ref 3.5–5.1)
SODIUM: 137 mmol/L (ref 135–145)

## 2016-12-21 ENCOUNTER — Telehealth (HOSPITAL_COMMUNITY): Payer: Self-pay | Admitting: *Deleted

## 2016-12-21 DIAGNOSIS — I5022 Chronic systolic (congestive) heart failure: Secondary | ICD-10-CM

## 2016-12-21 NOTE — Telephone Encounter (Signed)
-----   Message from Laurey Morale, MD sent at 12/20/2016  4:51 PM EDT ----- Stable, no changes today.  Would just repeat BMET again in 2 wks to make sure creatinine does not trend up.

## 2017-01-03 ENCOUNTER — Telehealth (HOSPITAL_COMMUNITY): Payer: Self-pay | Admitting: *Deleted

## 2017-01-03 NOTE — Telephone Encounter (Signed)
Christopher [name] with pt's disability company called requested pt's last OV note, she states request was faxed back on 5/8 but she has never received records.  12/06/16 OV note faxed to her at [phone]

## 2017-01-04 ENCOUNTER — Ambulatory Visit (HOSPITAL_COMMUNITY)
Admission: RE | Admit: 2017-01-04 | Discharge: 2017-01-04 | Disposition: A | Discharge status: Home / Self Care | Payer: Medicaid Other | Source: Ambulatory Visit | Attending: Internal Medicine | Admitting: Internal Medicine

## 2017-01-04 DIAGNOSIS — I5022 Chronic systolic (congestive) heart failure: Secondary | ICD-10-CM | POA: Diagnosis not present

## 2017-01-04 LAB — BASIC METABOLIC PANEL
ANION GAP: 10 (ref 5–15)
BUN: 21 mg/dL — ABNORMAL HIGH (ref 6–20)
CALCIUM: 9.1 mg/dL (ref 8.9–10.3)
CO2: 27 mmol/L (ref 22–32)
Chloride: 99 mmol/L — ABNORMAL LOW (ref 101–111)
Creatinine, Ser: 1.78 mg/dL — ABNORMAL HIGH (ref 0.61–1.24)
GFR calc Af Amer: 48 mL/min — ABNORMAL LOW (ref >60)
GFR, EST NON AFRICAN AMERICAN: 41 mL/min — AB (ref >60)
GLUCOSE: 144 mg/dL — AB (ref 65–99)
Potassium: 5.3 mmol/L — ABNORMAL HIGH (ref 3.5–5.1)
SODIUM: 136 mmol/L (ref 135–145)

## 2017-01-06 ENCOUNTER — Encounter (HOSPITAL_COMMUNITY): Payer: Self-pay

## 2017-01-06 NOTE — Progress Notes (Signed)
Medical record request received from [facility] via fax 12/13/16. All requested record available from our office/providers 2016-present (30 pages total) faxed to provided # [phone]. Copy of request scanned into patient's electronic medical records.  Ave Filter, RN

## 2017-01-09 ENCOUNTER — Other Ambulatory Visit (HOSPITAL_COMMUNITY): Payer: Self-pay | Admitting: *Deleted

## 2017-01-09 ENCOUNTER — Inpatient Hospital Stay (HOSPITAL_COMMUNITY): Admission: RE | Admit: 2017-01-09 | Payer: PRIVATE HEALTH INSURANCE | Source: Ambulatory Visit

## 2017-01-09 DIAGNOSIS — I5022 Chronic systolic (congestive) heart failure: Secondary | ICD-10-CM

## 2017-01-10 ENCOUNTER — Ambulatory Visit (HOSPITAL_COMMUNITY)
Admission: RE | Admit: 2017-01-10 | Discharge: 2017-01-10 | Disposition: A | Discharge status: Home / Self Care | Payer: Medicaid Other | Source: Ambulatory Visit | Attending: Internal Medicine | Admitting: Internal Medicine

## 2017-01-10 DIAGNOSIS — I5022 Chronic systolic (congestive) heart failure: Secondary | ICD-10-CM | POA: Insufficient documentation

## 2017-01-10 LAB — BASIC METABOLIC PANEL
Anion gap: 8 (ref 5–15)
BUN: 19 mg/dL (ref 6–20)
CALCIUM: 9.5 mg/dL (ref 8.9–10.3)
CHLORIDE: 97 mmol/L — AB (ref 101–111)
CO2: 30 mmol/L (ref 22–32)
Creatinine, Ser: 1.82 mg/dL — ABNORMAL HIGH (ref 0.61–1.24)
GFR calc non Af Amer: 40 mL/min — ABNORMAL LOW (ref >60)
GFR, EST AFRICAN AMERICAN: 47 mL/min — AB (ref >60)
Glucose, Bld: 108 mg/dL — ABNORMAL HIGH (ref 65–99)
Potassium: 3.3 mmol/L — ABNORMAL LOW (ref 3.5–5.1)
Sodium: 135 mmol/L (ref 135–145)

## 2017-01-16 MED FILL — TORSEMIDE 20 MG TABLET: 20 | 30 days supply | Qty: 60 | Fill #1

## 2017-01-16 MED FILL — SPIRONOLACTONE 25 MG TABLET: 25 | 30 days supply | Qty: 30 | Fill #1

## 2017-01-16 MED FILL — CARVEDILOL 12.5 MG TABLET: 12.5 | 30 days supply | Qty: 90 | Fill #1

## 2017-01-16 MED FILL — LOSARTAN POTASSIUM 25 MG TA: 25 | 30 days supply | Qty: 60 | Fill #1

## 2017-01-17 ENCOUNTER — Encounter (HOSPITAL_COMMUNITY): Payer: Self-pay | Admitting: *Deleted

## 2017-01-25 ENCOUNTER — Telehealth (HOSPITAL_COMMUNITY): Payer: Self-pay | Admitting: *Deleted

## 2017-01-25 MED ORDER — POTASSIUM CHLORIDE CRYS ER 20 MEQ PO TBCR: 20.0000 meq | EXTENDED_RELEASE_TABLET | Freq: Every day | ORAL | 3 refills | Status: DC

## 2017-01-25 NOTE — Telephone Encounter (Signed)
Pt received letter from our office about lab work and returned our call, advised K was low per Dr Shirlee Latch start KCL 20 meq daily, pt aware and agreeable, he has KCL at home, bmet will be done at OV appt 7/2

## 2017-02-06 ENCOUNTER — Encounter (HOSPITAL_COMMUNITY): Payer: Self-pay | Admitting: Cardiology

## 2017-02-06 ENCOUNTER — Telehealth (HOSPITAL_COMMUNITY): Payer: Self-pay | Admitting: Vascular Surgery

## 2017-02-06 ENCOUNTER — Ambulatory Visit (HOSPITAL_COMMUNITY)
Admission: RE | Admit: 2017-02-06 | Discharge: 2017-02-06 | Disposition: A | Discharge status: Home / Self Care | Payer: Medicaid Other | Source: Ambulatory Visit | Attending: Cardiology | Admitting: Cardiology

## 2017-02-06 VITALS — BP 122/68 | HR 62 | Wt 286.2 lb

## 2017-02-06 DIAGNOSIS — I255 Ischemic cardiomyopathy: Secondary | ICD-10-CM | POA: Diagnosis not present

## 2017-02-06 DIAGNOSIS — Z951 Presence of aortocoronary bypass graft: Secondary | ICD-10-CM | POA: Diagnosis not present

## 2017-02-06 DIAGNOSIS — I739 Peripheral vascular disease, unspecified: Secondary | ICD-10-CM | POA: Insufficient documentation

## 2017-02-06 DIAGNOSIS — E785 Hyperlipidemia, unspecified: Secondary | ICD-10-CM | POA: Insufficient documentation

## 2017-02-06 DIAGNOSIS — I2510 Atherosclerotic heart disease of native coronary artery without angina pectoris: Secondary | ICD-10-CM | POA: Diagnosis not present

## 2017-02-06 DIAGNOSIS — I5022 Chronic systolic (congestive) heart failure: Secondary | ICD-10-CM

## 2017-02-06 DIAGNOSIS — G629 Polyneuropathy, unspecified: Secondary | ICD-10-CM | POA: Diagnosis not present

## 2017-02-06 DIAGNOSIS — I4892 Unspecified atrial flutter: Secondary | ICD-10-CM | POA: Diagnosis not present

## 2017-02-06 DIAGNOSIS — N183 Chronic kidney disease, stage 3 (moderate): Secondary | ICD-10-CM | POA: Diagnosis not present

## 2017-02-06 DIAGNOSIS — I252 Old myocardial infarction: Secondary | ICD-10-CM | POA: Insufficient documentation

## 2017-02-06 LAB — BASIC METABOLIC PANEL
Anion gap: 10 (ref 5–15)
BUN: 21 mg/dL — AB (ref 6–20)
CALCIUM: 9.3 mg/dL (ref 8.9–10.3)
CO2: 23 mmol/L (ref 22–32)
CREATININE: 1.78 mg/dL — AB (ref 0.61–1.24)
Chloride: 102 mmol/L (ref 101–111)
GFR calc Af Amer: 48 mL/min — ABNORMAL LOW (ref >60)
GFR calc non Af Amer: 41 mL/min — ABNORMAL LOW (ref >60)
GLUCOSE: 178 mg/dL — AB (ref 65–99)
Potassium: 4.3 mmol/L (ref 3.5–5.1)
Sodium: 135 mmol/L (ref 135–145)

## 2017-02-06 LAB — BRAIN NATRIURETIC PEPTIDE: B Natriuretic Peptide: 330.6 pg/mL — ABNORMAL HIGH (ref 0.0–100.0)

## 2017-02-06 MED ORDER — APIXABAN 5 MG PO TABS: 5.0000 mg | ORAL_TABLET | Freq: Two times a day (BID) | ORAL | 6 refills | Status: DC

## 2017-02-06 MED ORDER — TORSEMIDE 20 MG PO TABS: ORAL_TABLET | ORAL | 3 refills | Status: DC

## 2017-02-06 MED ORDER — SACUBITRIL-VALSARTAN 24-26 MG PO TABS: 1.0000 | ORAL_TABLET | Freq: Two times a day (BID) | ORAL | 3 refills | Status: DC

## 2017-02-06 NOTE — Progress Notes (Signed)
Patient ID: Christopher [name], male   DOB: 09-24-1960, 56 y.o.   MRN: [id number] PCP: Dr. Hyman Hopes Cardiology: Dr. Shirlee Latch  56 yo with history of CAD s/p CABG, ischemic cardiomyopathy, and CKD presents for cardiology followup.  In 3/17, he came to the hospital with a late presentation inferior MI.  He was noted to have occluded RCA with collaterals and 95% proximal LAD.  He had CABG x 4 + right CEA.  He had a balloon pump with cardiogenic shock.  He additionally had multiple episodes of post-op VT/VF.  Amiodarone was started.  Repeat cath showed slow flow in the SVG-D and SVG-ramus given diffusely diseased and small distal targets.  He developed AKI.  He had prolonged mechanical ventilation and a tracheostomy.  He had RV failure that was treated with Revatio.  He had transient atrial flutter that resolved quickly.  Last echo in 3/17 showed EF 30-35%.  Finally, the tracheostomy was removed.  He went to inpatient rehab for about a week and is now home again.  Echo 6/17 with EF 40-45%.   He has lost his job and now has disability.  Weight today is up again by 8 lbs.  Diet is poor, high in fat and sodium.  He is limited by claudication: bilateral calf pain after walking < 50 yards. This is somewhat worse over the past few months and limits his ambulation.  Therefore, he is not walking much. No dyspnea walking on flat ground.  No chest pain.  No orthopnea/PND.    Of note, patient is back in atrial flutter today with controlled rate.   ECG: atrial flutter at 54; personally reviewed.   Labs (4/17): K 3.4, creatinine 1.6 Labs (5/17): K 4.4, creatinine 1.65, LFTs normal, TSH mildly elevated, free T3 and free T4 normal, BNP 502, hemoglobin 12.3 Labs (6/17): BNP 737, K 4.4, creatinine 1.72 Labs (7/17): LDL 136, K 4.9, creatinine 1.79 Labs (8/17): BNP 239, K 3.8, creatinine 2.17 Labs (10/17): LDL 74, HDL 34, LFTs normal Labs (6/96): K 5, creatinine 1.67, BNP 774 Labs (6/18): K 3.3, creatinine 1.82  PMH: 1. CAD:  Late presentation inferior MI in 3/17.  - LHC showed totally occluded pRCA with L=>R collaterals, 95% pLAD, 70% ramus.   - CABG with LIMA-LAD, SVG-D, SVG-ramus, SVG-RCA.  - Repeat echo in 3/17 after CABG showed patent LIMA and SVG-RCA but SVG-D and SVG- ramus had slow flow with small, diffusely diseased distal targets.  - Echo (6/17) with EF 40-45%, inferior severe hypokinesis, mildly dilated RV with normal systolic function, PASP 30 mmHg.  2. Chronic systolic CHF: Ischemic cardiomyopathy.   - Echo (3/17) with EF 30-35%, septal and inferior AK.   3. RV failure: was on Revatio, now off.  4. CKD: Stage 3.  5. Atrial flutter: Transient, post-op.   6. VT: Post-op CABG.  7. Carotid stenosis: Right CEA 3/17.  8. PAD: Peripheral artery dopplers (7/17) with > 50% L CIA stenosis, occluded distal external iliac, occluded right SFA, occluded left SFA.  9. 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   Family History  Problem Relation Age of Onset  . Heart disease Mother   . Failure to thrive Father   . Congestive Heart Failure Father        Deceased  . Breast cancer Sister        Living   ROS: All systems reviewed and negative except as per HPI.   Current Outpatient Prescriptions  Medication  Sig Dispense Refill  . carvedilol (COREG) 12.5 MG tablet Take 1.5 tablets (18.75 mg total) by mouth 2 (two) times daily with a meal. 60 tablet 3  . gabapentin (NEURONTIN) 100 MG capsule Take 100 mg by mouth 3 (three) times daily as needed.    . NEOMYCIN-POLYMYXIN-HYDROCORTISONE (CORTISPORIN) 1 % SOLN otic solution Apply 1-2 drops to toe BID after soaking 10 mL 1  . pantoprazole (PROTONIX) 40 MG tablet Take 1 tablet (40 mg total) by mouth daily. 90 tablet 2  . potassium chloride SA (K-DUR,KLOR-CON) 20 MEQ tablet Take 1 tablet (20 mEq total) by mouth daily. 90 tablet 3  . rosuvastatin (CRESTOR) 40 MG tablet Take 1 tablet (40 mg total) by mouth daily.  90 tablet 3  . senna-docusate (SENOKOT-S) 8.6-50 MG tablet Take 1 tablet by mouth at bedtime. For constipation 30 tablet 0  . spironolactone (ALDACTONE) 25 MG tablet Take 1 tablet (25 mg total) by mouth daily. 90 tablet 2  . torsemide (DEMADEX) 20 MG tablet Take 2 tabs every other day ALTERNATING with 3 tabs every other day 90 tablet 3  . traMADol (ULTRAM) 50 MG tablet Take 50 mg by mouth every 6 (six) hours as needed.    Marland Kitchen apixaban (ELIQUIS) 5 MG TABS tablet Take 1 tablet (5 mg total) by mouth 2 (two) times daily. 60 tablet 6  . sacubitril-valsartan (ENTRESTO) 24-26 MG Take 1 tablet by mouth 2 (two) times daily. 60 tablet 3   No current facility-administered medications for this encounter.    BP 122/68   Pulse 62   Wt 286 lb 4 oz (129.8 kg)   SpO2 99%   BMI 41.07 kg/m  General: NAD, obese.  Neck: Thick neck, JVP 10-12 cm, no thyromegaly or thyroid nodule.  Lungs: CTAB CV: Nondisplaced PMI.  Heart irregular S1/S2, no S3/S4, no murmur. 1+ ankle edema.  No carotid bruit.  Unable to palpate pedal pulses. Abdomen: Soft, nontender, no hepatosplenomegaly, no distention.  Skin: Intact without lesions or rashes.  Neurologic: Alert and oriented x 3.  Psych: Normal affect. Extremities: No clubbing or cyanosis.  HEENT: Normal.   Assessment/Plan: 1. CAD: s/p CABG.  No chest pain.   - Continue Crestor.   2. Atrial flutter: Occurred post-CABG and was transient.  It has recurred, he is in atrial flutter today.  He does not feel it so not sure how long this episode has lasted.  Rate is controlled. - Stop ASA, start apixaban 5 mg bid. No history of overt bleeding.  After 1 month, I will set up DCCV if he remains in atrial flutter.  - Refer to EP, I think that he would benefit from a flutter ablation.  3. RV failure: Echo in 6/17 showed RV mildly dilated with normal systolic function.  He is now off Revatio. 4. Chronic systolic CHF: Ischemic cardiomyopathy.  Echo in 3/17 with EF 30-35%, improved to  40-45% on echo in 6/17.  NYHA class II symptoms.  He is volume overloaded on exam with 8 lb weight gain.   - Continue spironolactone 25 daily.  - Stop losartan, start Entresto 24/26 bid with BMET 10 days.   - Continue Coreg 18.75 mg bid.   - Increase torsemide to 60 mg daily x 3 days, then 60 mg daily alternating with 40 mg daily. BMET 10 days.  - Repeat echo at followup.  - Needs to cut back on sodium in diet, we discussed.   5. VT: Now off amiodarone.   6. CKD: Stage  3.  Follow closely.  7. Carotid stenosis: Right CEA in 2017.  Followed by [facility].    8. Hyperlipidemia: Good lipids on Crestor.   9. PAD: Extensive on last peripheral dopplers, claudication has worsened in the last few months and is now limiting.  Needs followup with Dr. Arbie Cookey who manages this, I will arrange.   [name] 02/06/2017

## 2017-02-06 NOTE — Patient Instructions (Signed)
Stop Losartan  Stop Aspirin  Start Entresto 24/26 mg Twice daily   Start Eliquis 5 mg Twice daily   Increase Torsemide to 60 mg (3 tabs) daily FOR 3 DAYS, THEN take 60 mg every other day ALTERNATING with 2 tabs every other day   Labs today  You have been referred to EP to discuss a-flutter ablation   You have been referred to follow back up with Dr Early  Your physician recommends that you schedule a follow-up appointment in: 2 weeks

## 2017-02-06 NOTE — Telephone Encounter (Signed)
Called [facility] left message on scheduling line to give pt a call to make f/u appt w/ Dr. Arbie Cookey

## 2017-02-13 MED FILL — SPIRONOLACTONE 25 MG TABLET: 25 | 30 days supply | Qty: 30 | Fill #2

## 2017-02-13 MED FILL — CARVEDILOL 12.5 MG TABLET: 12.5 | 30 days supply | Qty: 90 | Fill #2

## 2017-02-13 MED FILL — TORSEMIDE 20 MG TABLET: 20 | 30 days supply | Qty: 60 | Fill #2

## 2017-02-14 ENCOUNTER — Telehealth (HOSPITAL_COMMUNITY): Payer: Self-pay | Admitting: Pharmacist

## 2017-02-14 NOTE — Telephone Encounter (Signed)
Entresto PA approved by [place] [employer] through 02/02/18.   Christopher [name]. Bonnye Fava, PharmD, BCPS, CPP Clinical Pharmacist Pager: [phone] Phone: [phone] 02/14/2017 9:30 AM

## 2017-02-20 ENCOUNTER — Ambulatory Visit (HOSPITAL_COMMUNITY)
Admission: RE | Admit: 2017-02-20 | Discharge: 2017-02-20 | Disposition: A | Discharge status: Home / Self Care | Payer: Medicaid Other | Source: Ambulatory Visit | Attending: Cardiology | Admitting: Cardiology

## 2017-02-20 ENCOUNTER — Encounter (HOSPITAL_COMMUNITY): Payer: Self-pay

## 2017-02-20 VITALS — BP 96/60 | HR 46 | Wt 285.2 lb

## 2017-02-20 DIAGNOSIS — Z803 Family history of malignant neoplasm of breast: Secondary | ICD-10-CM | POA: Diagnosis not present

## 2017-02-20 DIAGNOSIS — E785 Hyperlipidemia, unspecified: Secondary | ICD-10-CM | POA: Diagnosis not present

## 2017-02-20 DIAGNOSIS — I252 Old myocardial infarction: Secondary | ICD-10-CM | POA: Diagnosis not present

## 2017-02-20 DIAGNOSIS — I6521 Occlusion and stenosis of right carotid artery: Secondary | ICD-10-CM | POA: Diagnosis not present

## 2017-02-20 DIAGNOSIS — N183 Chronic kidney disease, stage 3 unspecified: Secondary | ICD-10-CM

## 2017-02-20 DIAGNOSIS — I2510 Atherosclerotic heart disease of native coronary artery without angina pectoris: Secondary | ICD-10-CM | POA: Insufficient documentation

## 2017-02-20 DIAGNOSIS — Z79899 Other long term (current) drug therapy: Secondary | ICD-10-CM | POA: Insufficient documentation

## 2017-02-20 DIAGNOSIS — I255 Ischemic cardiomyopathy: Secondary | ICD-10-CM | POA: Diagnosis not present

## 2017-02-20 DIAGNOSIS — Z951 Presence of aortocoronary bypass graft: Secondary | ICD-10-CM | POA: Diagnosis not present

## 2017-02-20 DIAGNOSIS — G629 Polyneuropathy, unspecified: Secondary | ICD-10-CM | POA: Insufficient documentation

## 2017-02-20 DIAGNOSIS — Z8249 Family history of ischemic heart disease and other diseases of the circulatory system: Secondary | ICD-10-CM | POA: Diagnosis not present

## 2017-02-20 DIAGNOSIS — I739 Peripheral vascular disease, unspecified: Secondary | ICD-10-CM | POA: Insufficient documentation

## 2017-02-20 DIAGNOSIS — I4892 Unspecified atrial flutter: Secondary | ICD-10-CM | POA: Insufficient documentation

## 2017-02-20 DIAGNOSIS — Z9889 Other specified postprocedural states: Secondary | ICD-10-CM | POA: Insufficient documentation

## 2017-02-20 DIAGNOSIS — I5022 Chronic systolic (congestive) heart failure: Secondary | ICD-10-CM | POA: Insufficient documentation

## 2017-02-20 MED ORDER — CARVEDILOL 12.5 MG PO TABS: 12.5000 mg | ORAL_TABLET | Freq: Two times a day (BID) | ORAL | 3 refills | Status: DC

## 2017-02-20 NOTE — Progress Notes (Signed)
Patient ID: KYL GIVLER, male   DOB: 12/25/1960, 56 y.o.   MRN: [id number]   PCP: Dr. Hyman Hopes Cardiology: Dr. Shirlee Latch Vascular: Dr Early  56 yo with history of CAD s/p CABG, ischemic cardiomyopathy, and CKD presents for cardiology followup.  In 3/17, he came to the hospital with a late presentation inferior MI.  He was noted to have occluded RCA with collaterals and 95% proximal LAD.  He had CABG x 4 + right CEA.  He had a balloon pump with cardiogenic shock.  He additionally had multiple episodes of post-op VT/VF.  Amiodarone was started.  Repeat cath showed slow flow in the SVG-D and SVG-ramus given diffusely diseased and small distal targets.  He developed AKI.  He had prolonged mechanical ventilation and a tracheostomy.  He had RV failure that was treated with Revatio.  He had transient atrial flutter that resolved quickly.  Last echo in 3/17 showed EF 30-35%.  Finally, the tracheostomy was removed.  He went to inpatient rehab for about a week and is now home again.  Echo 6/17 with EF 40-45%.   Today he returns for HF follow up. Last visit he was started on eliquis for A flutter. Overall feeling ok. Complaining of fatigue. Ongoing calf pain when he walks. Mild dyspnea with exertion. Tries to eat <1000 mg sodium. No bleeding problems. Appetite ok. No fever or chills. Taking all medications. Not working. He does not smoke. Lives with his wife. She prepares pill box.  Says he would like to be referred to cardiac rehab as he now has Medicaid.   ECG: A flutter 45 bpm  Labs (4/17): K 3.4, creatinine 1.6 Labs (5/17): K 4.4, creatinine 1.65, LFTs normal, TSH mildly elevated, free T3 and free T4 normal, BNP 502, hemoglobin 12.3 Labs (6/17): BNP 737, K 4.4, creatinine 1.72 Labs (7/17): LDL 136, K 4.9, creatinine 1.79 Labs (8/17): BNP 239, K 3.8, creatinine 2.17 Labs (10/17): LDL 74, HDL 34, LFTs normal Labs (4/09): K 5, creatinine 1.67, BNP 774 Labs (6/18): K 3.3, creatinine 1.82 Labs 02/06/2017: K 4.3  Creatinine 1.78.   PMH: 1. CAD: Late presentation inferior MI in 3/17.  - LHC showed totally occluded pRCA with L=>R collaterals, 95% pLAD, 70% ramus.   - CABG with LIMA-LAD, SVG-D, SVG-ramus, SVG-RCA.  - Repeat echo in 3/17 after CABG showed patent LIMA and SVG-RCA but SVG-D and SVG- ramus had slow flow with small, diffusely diseased distal targets.  - Echo (6/17) with EF 40-45%, inferior severe hypokinesis, mildly dilated RV with normal systolic function, PASP 30 mmHg.  2. Chronic systolic CHF: Ischemic cardiomyopathy.   - Echo (3/17) with EF 30-35%, septal and inferior AK.   3. RV failure: was on Revatio, now off.  4. CKD: Stage 3.  5. Atrial flutter: Transient, post-op.   6. VT: Post-op CABG.  7. Carotid stenosis: Right CEA 3/17.  8. PAD: Peripheral artery dopplers (7/17) with > 50% L CIA stenosis, occluded distal external iliac, occluded right SFA, occluded left SFA.  9. 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   Family History  Problem Relation Age of Onset  . Heart disease Mother   . Failure to thrive Father   . Congestive Heart Failure Father        Deceased  . Breast cancer Sister        Living   ROS: All systems reviewed and negative except as per HPI.   Current Outpatient Prescriptions  Medication Sig Dispense Refill  . apixaban (ELIQUIS) 5 MG TABS tablet Take 1 tablet (5 mg total) by mouth 2 (two) times daily. 60 tablet 6  . carvedilol (COREG) 12.5 MG tablet Take 1.5 tablets (18.75 mg total) by mouth 2 (two) times daily with a meal. 60 tablet 3  . gabapentin (NEURONTIN) 100 MG capsule Take 100 mg by mouth 3 (three) times daily as needed.    . NEOMYCIN-POLYMYXIN-HYDROCORTISONE (CORTISPORIN) 1 % SOLN otic solution Apply 1-2 drops to toe BID after soaking 10 mL 1  . pantoprazole (PROTONIX) 40 MG tablet Take 1 tablet (40 mg total) by mouth daily. 90 tablet 2  . potassium chloride SA (K-DUR,KLOR-CON) 20 MEQ  tablet Take 1 tablet (20 mEq total) by mouth daily. 90 tablet 3  . rosuvastatin (CRESTOR) 40 MG tablet Take 1 tablet (40 mg total) by mouth daily. 90 tablet 3  . sacubitril-valsartan (ENTRESTO) 24-26 MG Take 1 tablet by mouth 2 (two) times daily. 60 tablet 3  . senna-docusate (SENOKOT-S) 8.6-50 MG tablet Take 1 tablet by mouth at bedtime. For constipation 30 tablet 0  . spironolactone (ALDACTONE) 25 MG tablet Take 1 tablet (25 mg total) by mouth daily. 90 tablet 2  . torsemide (DEMADEX) 20 MG tablet Take 2 tabs every other day ALTERNATING with 3 tabs every other day 90 tablet 3  . traMADol (ULTRAM) 50 MG tablet Take 50 mg by mouth every 6 (six) hours as needed.     No current facility-administered medications for this encounter.    BP 96/60   Pulse (!) 46   Wt 285 lb 3.2 oz (129.4 kg)   SpO2 98%   BMI 40.92 kg/m   Filed Weights   02/20/17 1517  Weight: 285 lb 3.2 oz (129.4 kg)   General:  Well appearing. No resp difficulty. Wife present HEENT: normal Neck: supple. JVP 6-7. Carotids 2+ bilat; no bruits. No lymphadenopathy or thryomegaly appreciated. Cor: PMI nondisplaced. Brady rate & rhythm. No rubs, gallops or murmurs. Lungs: clear Abdomen: soft, nontender, distended. No hepatosplenomegaly. No bruits or masses. Good bowel sounds. Extremities: no cyanosis, clubbing, rash, edema Neuro: alert & orientedx3, cranial nerves grossly intact. moves all 4 extremities w/o difficulty. Affect pleasant  EKG: A flutter 45 bpm    Assessment/Plan: 1. CAD: s/p CABG.  No chest pain.    - Continue Crestor.   2. Atrial flutter: Occurred post-CABG and was transient.  It has recurred, he is in atrial flutter today.  He does not feel it so not sure how long this episode has lasted.  Rate is controlled. - Started  apixaban 5 mg bid on 02/06/2017. He understands the importance of compliance.  No bleeding problems.  Discussed with Dr Shirlee Latch. Will try to set up EP appointment ASAP and hopefully set up  ablation.  Heart 45 will cut back carvedilol to 12.5 mg twice a day.   3. RV failure: Echo in 6/17 showed RV mildly dilated with normal systolic function.  He is now off Revatio. 4. Chronic systolic CHF: Ischemic cardiomyopathy.  Echo in 3/17 with EF 30-35%, improved to 40-45% on echo in 6/17. Will need to repeat ECHO . Try to restore NSR.   NYHA class II-III.  - Continue spironolactone 25 daily.  - Continue Entresto 24/26 bid - Cut back coreg to 12.5 mg twice a day as above due low heart rate and fatigue.  Continue torsemide 60 mg dialy and alternate with 40 mg daily.  5. VT: Now off amiodarone.  6. CKD: Stage 3- recent creatinine stable. Creatinine baseline 1.7-1.9  7. Carotid stenosis: Right CEA in 2017.  Followed by [facility].    8. Hyperlipidemia: Continue crestor. Good lipids on Crestor.   9. PAD: Extensive on last peripheral dopplers, claudication has worsened in the last few months and is now limiting.  Has follow up with Dr Arbie Cookey.   Refer to cardiac rehab. Cut back carvedilol as above. Refer to EP. Set up ECHo after DC-CV.   Follow up 8 weeks.   Marisel Tostenson NP-C  02/20/2017

## 2017-02-20 NOTE — Patient Instructions (Addendum)
No lab work today.  DECREASE Carvedilol (Coreg) to 12.5 mg (1 tablet) twice daily.  Will refer you to Cardiac Rehab at [facility]. They will call you to set up initial appointment.  Will refer you to electrophysiology at [facility]. Address: [address] ([address] Floor), [place], [place] [address]  Phone: [phone]  Follow up 6 weeks with Amy Clegg NP-C.  Take all medication as prescribed the day of your appointment. Bring all medications with you to your appointment.  Do the following things EVERYDAY: 1) Weigh yourself in the morning before breakfast. Write it down and keep it in a log. 2) Take your medicines as prescribed 3) Eat low salt foods-Limit salt (sodium) to 2000 mg per day.  4) Stay as active as you can everyday 5) Limit all fluids for the day to less than 2 liters

## 2017-02-21 DIAGNOSIS — Z736 Limitation of activities due to disability: Secondary | ICD-10-CM

## 2017-02-22 ENCOUNTER — Encounter: Payer: Self-pay | Admitting: Internal Medicine

## 2017-02-22 ENCOUNTER — Ambulatory Visit (INDEPENDENT_AMBULATORY_CARE_PROVIDER_SITE_OTHER): Payer: Medicaid Other | Admitting: Internal Medicine

## 2017-02-22 VITALS — BP 120/80 | HR 56 | Ht 70.0 in | Wt 276.0 lb

## 2017-02-22 DIAGNOSIS — I4892 Unspecified atrial flutter: Secondary | ICD-10-CM

## 2017-02-22 NOTE — Progress Notes (Signed)
ELECTROPHYSIOLOGY CONSULT NOTE  Patient ID: Christopher [name], MRN: [id number], DOB/AGE: Apr 15, 1961 56 y.o. Admit date: (Not on file) Date of Consult: 02/22/2017  Primary Physician: Shirlean Mylar, MD Primary Cardiologist: DM   Christopher [name] is being seen today for the evaluation of Atrial flutter at the request of  Shirlean Mylar, MD. Dr Shirlee Latch  HPI Christopher [name] is a 56 y.o. male  Referred for consideration of largely asymptomatic atrial flutter recently detected on his ECG and assoc with slow VR   He has a history of coronary artery disease with prior bypass surgery. This was accomplished 3/17 after he presented late with inferior wall MI. This is complicated by cardiogenic shock and multiple. Post operative Episodes of VT VF.  Also c/o Acute Kidney injury postoperative ECG 10/30/15 06:56 demonstrated the onset of atypical atrial flutter this persisted for at least 24 hours. This was notable for small negative flutter waves in the inferior leads and biphasic negative flutter waves in lead V1  ECG 7/18 has demonstrated atypical atrial flutter with positive flutter waves in the inferior leads and negative flutter waves in lead V 1  DATE TEST    3/17    Echo   EF 30-35 %   6/17    Echo   EF 40.45 %         He may have noted mild fatigue. He has not had any problems with exercise intolerance to speak of. He denies lightheadedness or palpitations.  He is currently taking apixaban.  He has a history of a prior TIA.   Past Medical History:  Diagnosis Date  . Complete heart block (HCC) 10/20/2015  . Hypertension   . Stenosis of right carotid artery   . TIA (transient ischemic attack)       Surgical History:  Past Surgical History:  Procedure Laterality Date  . arm surgery Right 2003  . CARDIAC CATHETERIZATION N/A 10/20/2015   Procedure: Left Heart Cath and Coronary Angiography;  Surgeon: Tonny Bollman, MD;  Location: [facility] INVASIVE CV LAB;  Service: Cardiovascular;   Laterality: N/A;  . CARDIAC CATHETERIZATION N/A 11/02/2015   Procedure: Right/Left Heart Cath and Coronary/Graft Angiography;  Surgeon: Laurey Morale, MD;  Location: [facility] INVASIVE CV LAB;  Service: Cardiovascular;  Laterality: N/A;  . CORONARY ARTERY BYPASS GRAFT N/A 10/27/2015   Procedure: CORONARY ARTERY BYPASS GRAFTING (CABG) x four, using left internal mammary artery and rt leg greater saphenous vein harvested endoscopically;  Surgeon: Kerin Perna, MD;  Location: [facility] OR;  Service: Open Heart Surgery;  Laterality: N/A;  . ENDARTERECTOMY Right 10/27/2015   Procedure: RIGHT ENDARTERECTOMY CAROTID with patch angioplasty using Xenosure bovine pericardium patch;  Surgeon: Fransisco Hertz, MD;  Location: [facility] OR;  Service: Vascular;  Laterality: Right;  . EYE SURGERY    . LACERATION REPAIR    . TEE WITHOUT CARDIOVERSION N/A 10/27/2015   Procedure: TRANSESOPHAGEAL ECHOCARDIOGRAM (TEE);  Surgeon: Kerin Perna, MD;  Location: [facility] OR;  Service: Open Heart Surgery;  Laterality: N/A;     Home Meds: Prior to Admission medications   Medication Sig Start Date End Date Taking? Authorizing Provider  apixaban (ELIQUIS) 5 MG TABS tablet Take 1 tablet (5 mg total) by mouth 2 (two) times daily. 02/06/17  Yes Laurey Morale, MD  carvedilol (COREG) 12.5 MG tablet Take 1 tablet (12.5 mg total) by mouth 2 (two) times daily with a meal. 02/20/17  Yes Clegg, Amy D, NP  gabapentin (NEURONTIN) 100  MG capsule Take 100 mg by mouth 3 (three) times daily as needed.   Yes [provider]  NEOMYCIN-POLYMYXIN-HYDROCORTISONE (CORTISPORIN) 1 % SOLN otic solution Apply 1-2 drops to toe BID after soaking 04/07/16  Yes Hyatt, Max T, DPM  pantoprazole (PROTONIX) 40 MG tablet Take 1 tablet (40 mg total) by mouth daily. 12/30/15  Yes Laurey Morale, MD  potassium chloride SA (K-DUR,KLOR-CON) 20 MEQ tablet Take 1 tablet (20 mEq total) by mouth daily. 01/25/17  Yes Laurey Morale, MD  rosuvastatin (CRESTOR) 40 MG tablet Take 1  tablet (40 mg total) by mouth daily. 03/09/16 08/16/17 Yes Laurey Morale, MD  sacubitril-valsartan (ENTRESTO) 24-26 MG Take 1 tablet by mouth 2 (two) times daily. 02/06/17  Yes Laurey Morale, MD  senna-docusate (SENOKOT-S) 8.6-50 MG tablet Take 1 tablet by mouth at bedtime. For constipation 12/01/15  Yes Love, Evlyn Kanner, PA-C  spironolactone (ALDACTONE) 25 MG tablet Take 1 tablet (25 mg total) by mouth daily. 12/06/16  Yes Laurey Morale, MD  torsemide [facility]) 20 MG tablet Take 2 tabs every other day ALTERNATING with 3 tabs every other day 02/06/17  Yes Laurey Morale, MD  traMADol (ULTRAM) 50 MG tablet Take 50 mg by mouth every 6 (six) hours as needed.   Yes [provider]    Allergies:  Allergies  Allergen Reactions  . Losartan Shortness Of Breath  . Elita Quick [Ceftazidime] Rash    Rash with blisters  . Lisinopril Rash    Social History   Social History  . Marital status: Single    Spouse name: N/A  . Number of children: N/A  . Years of education: N/A   Occupational History  . Not on file.   Social History Main Topics  . Smoking status: Former Smoker    Quit date: 03/23/2002  . Smokeless tobacco: Never Used  . 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 Drug use: Yes    Types: Marijuana     Comment: Daily   . Sexual activity: Not on file   Other Topics Concern  . Not on file   Social History Narrative   Lives with girlfriend in an apartment on the second floor.     Works as a Teaching laboratory technician for apartment complex.     Has no children.  Education: 10th grade.     Family History  Problem Relation Age of Onset  . Heart disease Mother   . Failure to thrive Father   . Congestive Heart Failure Father        Deceased  . Breast cancer Sister        Living     ROS:  Please see the history of present illness.     All other systems reviewed and negative.    Physical Exam:  Blood  pressure 120/80, pulse (!) 56, height 5\' 10"  (1.778 m), weight 276 lb (125.2 kg), SpO2 98 %. General: Well developed, well nourished male in no acute distress. Head: Normocephalic, atraumatic, sclera non-icteric, no xanthomas, nares are without discharge. EENT: normal  Lymph Nodes:  none Neck: Negative for carotid bruits. JVD not elevated. Back:without scoliosis kyphosis Lungs: Clear bilaterally to auscultation without wheezes, rales, or rhonchi. Breathing is unlabored. Heart:Slow and regular rate and rhythm murmur . No rubs, or gallops appreciated. Abdomen: Soft, non-tender, non-distended with normoactive bowel sounds. No hepatomegaly. No rebound/guarding. No obvious abdominal masses.  Msk:  Strength and tone appear normal for age. Extremities: No clubbing or cyanosis. No  edema.  Distal pedal pulses are 2+ and equal bilaterally. Skin: Warm and Dry Neuro: Alert and oriented X 3. CN III-XII intact Grossly normal sensory and motor function . Psych:  Responds to questions appropriately with a normal affect.      Labs: Cardiac Enzymes No results for input(s): CKTOTAL, CKMB, TROPONINI in the last 72 hours. CBC Lab Results  Component Value Date   WBC 7.9 12/10/2015   HGB 12.3 (L) 12/10/2015   HCT 39.9 12/10/2015   MCV 88.3 12/10/2015   PLT 245 12/10/2015   PROTIME: No results for input(s): LABPROT, INR in the last 72 hours. Chemistry No results for input(s): NA, K, CL, CO2, BUN, CREATININE, CALCIUM, PROT, BILITOT, ALKPHOS, ALT, AST, GLUCOSE in the last 168 hours.  Invalid input(s): LABALBU Lipids Lab Results  Component Value Date   CHOL 146 05/23/2016   HDL 34 (L) 05/23/2016   LDLCALC 74 05/23/2016   TRIG 192 (H) 05/23/2016   BNP No results found for: PROBNP Thyroid Function Tests: No results for input(s): TSH, T4TOTAL, T3FREE, THYROIDAB in the last 72 hours.  Invalid input(s): FREET3 Miscellaneous No results found for: DDIMER  Radiology/Studies:  No results  found.  EKG: * Atrial flutter with positive flutter waves in lead 2 and negative flutter waves in lead V1 4:1 conduction ventricular rate 55 Intervals-/11/45 Axis left -31   Assessment and Plan:  Atrial flutter-atypical-2  Ischemic cardiomyopathy with prior CABG  Anticoagulation  TIA       The patient has atypical atrial flutter. His surgical report describes no atriotomy incisions and so this statistically should be reverse typical flutter. However, reviewing tracings from his postoperative time he has another atypical flutter with negative flutter waves in the inferior leads as opposed to positive flutter waves raising the concern as to whether he has underlying atriopathy and whether medical therapy might be more productive than ablative therapy. However, His TIA antedated his surgery by a year. His association with a subsequent carotid endarterectomy cannot be presumed. Hence, I am not sure that even with flutter ablation that anticoagulation would be discontinued.  I have discussed this with Dr Julien Nordmann who agrees    I am not sure, not withstanding  his young age, that ablation makes sense in the context of a paucity of symptoms. If we did however, it would be benefited by adjunctive electro anatomical mapping. Hence, I will talk with Dr. Johney Frame and/or Ladona Ridgel regarding this       in the interim, will undertake an echocardiogram to reassess left ventricular function but more importantly to look at left atrial size   On Anticoagulation;  No bleeding issues         Christopher [name] Sherryl Manges

## 2017-02-22 NOTE — Patient Instructions (Signed)
Medication Instructions: - Your physician recommends that you continue on your current medications as directed. Please refer to the Current Medication list given to you today.  Labwork: - none ordered  Procedures/Testing: - Your physician has requested that you have an echocardiogram. Echocardiography is a painless test that uses sound waves to create images of your heart. It provides your doctor with information about the size and shape of your heart and how well your heart's chambers and valves are working. This procedure takes approximately one hour. There are no restrictions for this procedure.  Follow-Up: - Dr. Graciela Husbands will see you back on an as needed  Any Additional Special Instructions Will Be Listed Below (If Applicable).     If you need a refill on your cardiac medications before your next appointment, please call your pharmacy.

## 2017-03-02 ENCOUNTER — Other Ambulatory Visit: Payer: Self-pay

## 2017-03-02 ENCOUNTER — Ambulatory Visit (HOSPITAL_COMMUNITY): Payer: Medicaid Other | Attending: Cardiology

## 2017-03-02 DIAGNOSIS — I071 Rheumatic tricuspid insufficiency: Secondary | ICD-10-CM | POA: Insufficient documentation

## 2017-03-02 DIAGNOSIS — I10 Essential (primary) hypertension: Secondary | ICD-10-CM | POA: Diagnosis not present

## 2017-03-02 DIAGNOSIS — Z8673 Personal history of transient ischemic attack (TIA), and cerebral infarction without residual deficits: Secondary | ICD-10-CM | POA: Diagnosis not present

## 2017-03-02 DIAGNOSIS — I4892 Unspecified atrial flutter: Secondary | ICD-10-CM | POA: Diagnosis present

## 2017-03-02 MED ORDER — PERFLUTREN LIPID MICROSPHERE
1.0000 mL | INTRAVENOUS | Status: AC | PRN
  Administered 2017-03-02: 2 mL via INTRAVENOUS

## 2017-03-10 ENCOUNTER — Ambulatory Visit: Payer: Medicaid Other | Admitting: Internal Medicine

## 2017-03-14 ENCOUNTER — Telehealth (HOSPITAL_COMMUNITY): Payer: Self-pay

## 2017-03-14 ENCOUNTER — Encounter (HOSPITAL_COMMUNITY): Payer: Self-pay

## 2017-03-14 NOTE — Telephone Encounter (Signed)
I called and left message on patient voicemail to contact office about scheduling for cardiac rehab. I left office contact information on patient voicemail to return call.  °

## 2017-03-16 ENCOUNTER — Telehealth (HOSPITAL_COMMUNITY): Payer: Self-pay | Admitting: *Deleted

## 2017-03-16 NOTE — Telephone Encounter (Signed)
Returned call from message left by patient from earlier today.  Message left on the answering machine to please call back to schedule for cardiac rehab.  Contact information provided. Alanson Aly, BSN Cardiac and Emergency planning/management officer

## 2017-03-17 ENCOUNTER — Emergency Department (HOSPITAL_COMMUNITY)
Admission: EM | Admit: 2017-03-17 | Discharge: 2017-03-17 | Disposition: A | Discharge status: Home / Self Care | Payer: Medicaid Other | Attending: Emergency Medicine | Admitting: Emergency Medicine

## 2017-03-17 ENCOUNTER — Emergency Department (HOSPITAL_COMMUNITY): Payer: Medicaid Other

## 2017-03-17 ENCOUNTER — Encounter (HOSPITAL_COMMUNITY): Payer: Self-pay | Admitting: *Deleted

## 2017-03-17 ENCOUNTER — Other Ambulatory Visit (HOSPITAL_COMMUNITY): Payer: Self-pay | Admitting: Cardiology

## 2017-03-17 DIAGNOSIS — D759 Disease of blood and blood-forming organs, unspecified: Secondary | ICD-10-CM | POA: Insufficient documentation

## 2017-03-17 DIAGNOSIS — Z79899 Other long term (current) drug therapy: Secondary | ICD-10-CM | POA: Insufficient documentation

## 2017-03-17 DIAGNOSIS — I2510 Atherosclerotic heart disease of native coronary artery without angina pectoris: Secondary | ICD-10-CM | POA: Insufficient documentation

## 2017-03-17 DIAGNOSIS — R002 Palpitations: Secondary | ICD-10-CM | POA: Diagnosis present

## 2017-03-17 DIAGNOSIS — I110 Hypertensive heart disease with heart failure: Secondary | ICD-10-CM | POA: Diagnosis not present

## 2017-03-17 DIAGNOSIS — T426X5A Adverse effect of other antiepileptic and sedative-hypnotic drugs, initial encounter: Secondary | ICD-10-CM | POA: Insufficient documentation

## 2017-03-17 DIAGNOSIS — Z951 Presence of aortocoronary bypass graft: Secondary | ICD-10-CM | POA: Insufficient documentation

## 2017-03-17 DIAGNOSIS — R292 Abnormal reflex: Secondary | ICD-10-CM | POA: Diagnosis not present

## 2017-03-17 DIAGNOSIS — I5041 Acute combined systolic (congestive) and diastolic (congestive) heart failure: Secondary | ICD-10-CM | POA: Diagnosis not present

## 2017-03-17 DIAGNOSIS — Z7901 Long term (current) use of anticoagulants: Secondary | ICD-10-CM | POA: Diagnosis not present

## 2017-03-17 DIAGNOSIS — Z87891 Personal history of nicotine dependence: Secondary | ICD-10-CM | POA: Insufficient documentation

## 2017-03-17 DIAGNOSIS — I509 Heart failure, unspecified: Secondary | ICD-10-CM

## 2017-03-17 LAB — CBC
HCT: 44.2 % (ref 39.0–52.0)
HEMOGLOBIN: 14.8 g/dL (ref 13.0–17.0)
MCH: 31.8 pg (ref 26.0–34.0)
MCHC: 33.5 g/dL (ref 30.0–36.0)
MCV: 94.8 fL (ref 78.0–100.0)
PLATELETS: 181 10*3/uL (ref 150–400)
RBC: 4.66 MIL/uL (ref 4.22–5.81)
RDW: 16.2 % — ABNORMAL HIGH (ref 11.5–15.5)
WBC: 9.1 10*3/uL (ref 4.0–10.5)

## 2017-03-17 LAB — BASIC METABOLIC PANEL
Anion gap: 11 (ref 5–15)
BUN: 39 mg/dL — AB (ref 6–20)
CHLORIDE: 102 mmol/L (ref 101–111)
CO2: 23 mmol/L (ref 22–32)
CREATININE: 2.47 mg/dL — AB (ref 0.61–1.24)
Calcium: 10.1 mg/dL (ref 8.9–10.3)
GFR calc Af Amer: 32 mL/min — ABNORMAL LOW (ref >60)
GFR, EST NON AFRICAN AMERICAN: 28 mL/min — AB (ref >60)
Glucose, Bld: 131 mg/dL — ABNORMAL HIGH (ref 65–99)
Potassium: 5 mmol/L (ref 3.5–5.1)
SODIUM: 136 mmol/L (ref 135–145)

## 2017-03-17 LAB — I-STAT TROPONIN, ED: Troponin i, poc: 0.01 ng/mL (ref 0.00–0.08)

## 2017-03-17 MED ORDER — SODIUM CHLORIDE 0.9 % IV BOLUS (SEPSIS): 500.0000 mL | Freq: Once | INTRAVENOUS | Status: DC

## 2017-03-17 NOTE — ED Notes (Signed)
Patient Alert and oriented X4. Stable and ambulatory. Patient verbalized understanding of the discharge instructions.  Patient belongings were taken by the patient.  

## 2017-03-17 NOTE — ED Triage Notes (Signed)
Pt reports being started on cymbalta on Tuesday. Having cold and hot sweats today, nausea and palpitations. Denies any cp or sob.

## 2017-03-17 NOTE — ED Provider Notes (Signed)
[facility]-EMERGENCY DEPT Provider Note   CSN: [id number] Arrival date & time: 03/17/17  1154   History   Chief Complaint Chief Complaint  Patient presents with  . Palpitations  . Nausea    HPI Christopher [name] is a 56 y.o. male who presented with random episodes of gagging followed by sweating and chills that quickly resolves. He has an extensive past medical history significant for COPD, CHF, HTN, CAD s/p CABG x4 and TIA to name a few. He stated that beginning yesterday he began to experience the episodes which are accompanied by coughing which has been ongoing for several days prior to the events which began this past Thursday morning. He stated that he developed a cough, rhinorrhea, and mild chills the day prior as well. Patients wife noted that he had complained of post nasal drip and gagging before the more recent events. He has not taken anything PO since Thursday morning due to just feeling "terrible". He added that he was scared as with his previous MI he only felt "terrible" and was told later he needed a CBAG. Although he denied feeling the same today, he was just concerned as he felt unusual and wanted to be sure he was not having an MI. Patient was aware that he had been in Atrial flutter for some time now and was following up with Cardiology for this. He denied additional pain today.   HPI  Past Medical History:  Diagnosis Date  . Complete heart block (HCC) 10/20/2015  . Hypertension   . Stenosis of right carotid artery   . TIA (transient ischemic attack)     Patient Active Problem List   Diagnosis Date Noted  . Chronic systolic CHF (congestive heart failure) (HCC) 02/07/2016  . Hypokalemia 12/01/2015  . Debility 11/24/2015  . Coronary artery disease involving native coronary artery of native heart without angina pectoris   . VT (ventricular tachycardia) (HCC)   . Ileus (HCC)   . Hyperosmolality and/or hypernatremia   . Acute respiratory failure with hypoxia (HCC)   .  Chronic obstructive pulmonary disease (HCC)   . Acute renal failure (HCC)   . Ventilator dependence (HCC)   . Tracheostomy status (HCC)   . S/P CABG x 4 10/27/2015  . CAD (coronary artery disease)   . Hypertensive heart disease with heart failure (HCC)   . Carotid artery stenosis   . Acute combined systolic and diastolic heart failure (HCC) 10/20/2015  . Complete heart block (HCC) 10/20/2015  . Acute MI, inferior wall, initial episode of care (HCC) 10/20/2015  . TIA (transient ischemic attack) 12/25/2014  . Essential hypertension 12/25/2014  . Hyperlipidemia 12/25/2014    Past Surgical History:  Procedure Laterality Date  . arm surgery Right 2003  . CARDIAC CATHETERIZATION N/A 10/20/2015   Procedure: Left Heart Cath and Coronary Angiography;  Surgeon: Tonny Bollman, MD;  Location: [facility] INVASIVE CV LAB;  Service: Cardiovascular;  Laterality: N/A;  . CARDIAC CATHETERIZATION N/A 11/02/2015   Procedure: Right/Left Heart Cath and Coronary/Graft Angiography;  Surgeon: Laurey Morale, MD;  Location: [facility] INVASIVE CV LAB;  Service: Cardiovascular;  Laterality: N/A;  . CORONARY ARTERY BYPASS GRAFT N/A 10/27/2015   Procedure: CORONARY ARTERY BYPASS GRAFTING (CABG) x four, using left internal mammary artery and rt leg greater saphenous vein harvested endoscopically;  Surgeon: Kerin Perna, MD;  Location: [facility] OR;  Service: Open Heart Surgery;  Laterality: N/A;  . ENDARTERECTOMY Right 10/27/2015   Procedure: RIGHT ENDARTERECTOMY CAROTID with patch angioplasty using Xenosure bovine  pericardium patch;  Surgeon: Fransisco Hertz, MD;  Location: [facility] OR;  Service: Vascular;  Laterality: Right;  . EYE SURGERY    . LACERATION REPAIR    . TEE WITHOUT CARDIOVERSION N/A 10/27/2015   Procedure: TRANSESOPHAGEAL ECHOCARDIOGRAM (TEE);  Surgeon: Kerin Perna, MD;  Location: [facility] OR;  Service: Open Heart Surgery;  Laterality: N/A;       Home Medications    Prior to Admission medications   Medication Sig Start Date  End Date Taking? Authorizing Provider  apixaban (ELIQUIS) 5 MG TABS tablet Take 1 tablet (5 mg total) by mouth 2 (two) times daily. 02/06/17  Yes Laurey Morale, MD  carvedilol (COREG) 12.5 MG tablet Take 1 tablet (12.5 mg total) by mouth 2 (two) times daily with a meal. 02/20/17  Yes Clegg, Amy D, NP  DULoxetine (CYMBALTA) 30 MG capsule Take 30 mg by mouth daily.   Yes [provider]  gabapentin (NEURONTIN) 100 MG capsule Take 100 mg by mouth 3 (three) times daily as needed.   Yes [provider]  potassium chloride SA (K-DUR,KLOR-CON) 20 MEQ tablet Take 1 tablet (20 mEq total) by mouth daily. 01/25/17  Yes Laurey Morale, MD  QUEtiapine (SEROQUEL) 25 MG tablet Take 25 mg by mouth at bedtime.   Yes [provider]  ranitidine (ZANTAC) 150 MG tablet Take 150 mg by mouth 2 (two) times daily.   Yes [provider]  rosuvastatin (CRESTOR) 40 MG tablet Take 1 tablet (40 mg total) by mouth daily. 03/09/16 08/16/17 Yes Laurey Morale, MD  sacubitril-valsartan (ENTRESTO) 24-26 MG Take 1 tablet by mouth 2 (two) times daily. 02/06/17  Yes Laurey Morale, MD  senna-docusate (SENOKOT-S) 8.6-50 MG tablet Take 1 tablet by mouth at bedtime. For constipation 12/01/15  Yes Love, Evlyn Kanner, PA-C  spironolactone (ALDACTONE) 25 MG tablet Take 1 tablet (25 mg total) by mouth daily. 12/06/16  Yes Laurey Morale, MD  torsemide [facility]) 20 MG tablet Take 2 tabs every other day ALTERNATING with 3 tabs every other day 02/06/17  Yes Laurey Morale, MD  NEOMYCIN-POLYMYXIN-HYDROCORTISONE (CORTISPORIN) 1 % SOLN otic solution Apply 1-2 drops to toe BID after soaking Patient not taking: Reported on 03/17/2017 04/07/16   Hyatt, Max T, DPM  pantoprazole (PROTONIX) 40 MG tablet Take 1 tablet (40 mg total) by mouth daily. Patient not taking: Reported on 03/17/2017 12/30/15   Laurey Morale, MD    Family History Family History  Problem Relation Age of Onset  . Heart disease Mother   . Failure to  thrive Father   . Congestive Heart Failure Father        Deceased  . Breast cancer Sister        Living    Social History Social History  Substance Use Topics  . Smoking status: Former Smoker    Quit date: 03/23/2002  . Smokeless tobacco: Never Used  . Alcohol use No     Comment: Previously drank 24 beer/daily for 27 years, but has been sober since June 2003     Allergies   Losartan; Elita Quick [ceftazidime]; and Lisinopril   Review of Systems Review of Systems  Constitutional: Positive for activity change, appetite change, chills and diaphoresis. Negative for fatigue and fever.  HENT: Positive for congestion, postnasal drip and rhinorrhea.   Eyes: Negative for redness and visual disturbance.  Respiratory: Positive for cough. Negative for choking, chest tightness, shortness of breath and wheezing.   Cardiovascular: Negative for chest pain,  palpitations and leg swelling.  Gastrointestinal: Negative for abdominal distention, constipation, diarrhea, nausea and vomiting.  Neurological: Negative for dizziness, weakness, light-headedness and headaches.  All other systems reviewed and are negative.    Physical Exam Updated Vital Signs BP 135/86   Pulse (!) 56   Temp (!) 97.2 F (36.2 C) (Oral)   Resp 14   SpO2 95%   Physical Exam  Constitutional: He is oriented to person, place, and time. He appears well-developed and well-nourished. No distress.  HENT:  Head: Normocephalic and atraumatic.  Mouth/Throat: No oropharyngeal exudate.  Eyes: Pupils are equal, round, and reactive to light. EOM are normal. No scleral icterus.  Neck: Normal range of motion. Neck supple. No tracheal deviation present.  Cardiovascular: Normal rate.  An irregular rhythm present. Exam reveals distant heart sounds.   No murmur heard. Atrial flutter 1:4  Pulmonary/Chest: Effort normal. No respiratory distress. He has rhonchi. He has rales in the right lower field and the left lower field.  Abdominal:  Soft. Bowel sounds are normal. He exhibits no distension. There is no tenderness. There is no rebound.  Musculoskeletal: Normal range of motion. He exhibits edema. He exhibits no tenderness.  Neurological: He is alert and oriented to person, place, and time.  Skin: Skin is warm and dry. Capillary refill takes less than 2 seconds. He is not diaphoretic. No pallor.  Psychiatric: He has a normal mood and affect. His behavior is normal.  Nursing note and vitals reviewed.    ED Treatments / Results  Labs (all labs ordered are listed, but only abnormal results are displayed) Labs Reviewed  BASIC METABOLIC PANEL - Abnormal; Notable for the following:       Result Value   Glucose, Bld 131 (*)    BUN 39 (*)    Creatinine, Ser 2.47 (*)    GFR calc non Af Amer 28 (*)    GFR calc Af Amer 32 (*)    All other components within normal limits  CBC - Abnormal; Notable for the following:    RDW 16.2 (*)    All other components within normal limits  I-STAT TROPONIN, ED    EKG  EKG Interpretation  Date/Time:  Friday March 17 2017 11:58:15 EDT Ventricular Rate:  58 PR Interval:    QRS Duration: 104 QT Interval:  448 QTC Calculation: 439 R Axis:   -43 Text Interpretation:  Atrial flutter with 4:1 A-V conduction Left axis deviation Inferior infarct , age undetermined Anterolateral infarct , age undetermined Abnormal ECG Confirmed by Tilden Fossa [phone]) on 03/17/2017 4:52:13 PM       Radiology Dg Chest 2 View  Result Date: 03/17/2017 CLINICAL DATA:  Cough. EXAM: CHEST  2 VIEW COMPARISON:  Radiographs of Jan 06, 2016. FINDINGS: Stable cardiomediastinal silhouette. Status post coronary artery bypass graft. No pneumothorax is noted. Right lung is clear. Stable left basilar scarring is noted. No acute pulmonary disease is noted. Stable bilateral rib fractures. IMPRESSION: Stable left basilar scarring. No acute cardiopulmonary abnormality seen. Electronically Signed   By: Lupita Raider, M.D.    On: 03/17/2017 12:38    Procedures Procedures (including critical care time)  Medications Ordered in ED Medications - No data to display   Initial Impression / Assessment and Plan / ED Course  I have reviewed the triage vital signs and the nursing notes.  Pertinent labs & imaging results that were available during my care of the patient were reviewed by me and considered in my medical decision  making (see chart for details).    Assessment: Yidel Teuscher is a 56 year old male who presented with a 30 hour history of feeling poorly. Given his current presentation he is most likely experiencing a viral URI with secondary anxiety resulting from his extensive cardiovascular history. Included in the differential are ACS, COPD exacerbation as well as possible gabapentin withdraw as he stopped the medication abruptly this past Tuesday. Given his evaluation thus far, pneumonia, pulmonary embolism and CHF are unlikely.   Plan: BMP demonstrated a Cr of 2.47, will administer bolus of fluids. CBC unremarkable as well as his initial troponin and chest x-ray. Will repeat a troponin and continue to monitor with fluids.   Patient most likely experiencing gabapentin withdraw. He is in agreement to be discharged home back on his Gabapentin to begin tapering down off of the medication with his PCP's advice.   Final Clinical Impressions(s) / ED Diagnoses   Final diagnoses:  Adverse effect of gabapentin, initial encounter    New Prescriptions Discharge Medication List as of 03/17/2017  5:33 PM       Lanelle Bal, MD 03/17/17 1735    Lanelle Bal, MD 03/17/17 2206    Tilden Fossa, MD 03/19/17 0009

## 2017-03-17 NOTE — Discharge Instructions (Signed)
Please continue taking your gabapentin at this time. Abrupt cessation can cause anxiety, diaphoresis and a generally feeling "terrible" as you described.  Please follow-up with your primary care doctor for assistance with tapering off of the gabapentin.   Please return to the ED if you develop chest pain, shortness of breath or difficulty walking in particular.   Thank you for your visit today.

## 2017-03-23 ENCOUNTER — Inpatient Hospital Stay (HOSPITAL_COMMUNITY)
Admission: EM | Admit: 2017-03-23 | Discharge: 2017-03-24 | DRG: 683 | Disposition: A | Discharge status: Home / Self Care | Payer: Medicaid Other | Attending: Internal Medicine | Admitting: Internal Medicine

## 2017-03-23 ENCOUNTER — Inpatient Hospital Stay (HOSPITAL_COMMUNITY): Payer: Medicaid Other

## 2017-03-23 ENCOUNTER — Encounter (HOSPITAL_COMMUNITY): Payer: Self-pay | Admitting: Emergency Medicine

## 2017-03-23 ENCOUNTER — Telehealth (HOSPITAL_COMMUNITY): Payer: Self-pay

## 2017-03-23 DIAGNOSIS — I130 Hypertensive heart and chronic kidney disease with heart failure and stage 1 through stage 4 chronic kidney disease, or unspecified chronic kidney disease: Secondary | ICD-10-CM | POA: Diagnosis present

## 2017-03-23 DIAGNOSIS — N179 Acute kidney failure, unspecified: Principal | ICD-10-CM | POA: Diagnosis present

## 2017-03-23 DIAGNOSIS — Z87891 Personal history of nicotine dependence: Secondary | ICD-10-CM

## 2017-03-23 DIAGNOSIS — E7800 Pure hypercholesterolemia, unspecified: Secondary | ICD-10-CM | POA: Diagnosis present

## 2017-03-23 DIAGNOSIS — N183 Chronic kidney disease, stage 3 (moderate): Secondary | ICD-10-CM | POA: Diagnosis present

## 2017-03-23 DIAGNOSIS — E870 Hyperosmolality and hypernatremia: Secondary | ICD-10-CM | POA: Diagnosis present

## 2017-03-23 DIAGNOSIS — Z7901 Long term (current) use of anticoagulants: Secondary | ICD-10-CM | POA: Diagnosis not present

## 2017-03-23 DIAGNOSIS — J449 Chronic obstructive pulmonary disease, unspecified: Secondary | ICD-10-CM | POA: Diagnosis present

## 2017-03-23 DIAGNOSIS — I5042 Chronic combined systolic (congestive) and diastolic (congestive) heart failure: Secondary | ICD-10-CM | POA: Diagnosis present

## 2017-03-23 DIAGNOSIS — Z951 Presence of aortocoronary bypass graft: Secondary | ICD-10-CM

## 2017-03-23 DIAGNOSIS — J410 Simple chronic bronchitis: Secondary | ICD-10-CM

## 2017-03-23 DIAGNOSIS — I252 Old myocardial infarction: Secondary | ICD-10-CM | POA: Diagnosis not present

## 2017-03-23 DIAGNOSIS — I255 Ischemic cardiomyopathy: Secondary | ICD-10-CM | POA: Diagnosis present

## 2017-03-23 DIAGNOSIS — I5022 Chronic systolic (congestive) heart failure: Secondary | ICD-10-CM

## 2017-03-23 DIAGNOSIS — I10 Essential (primary) hypertension: Secondary | ICD-10-CM | POA: Diagnosis present

## 2017-03-23 DIAGNOSIS — I2510 Atherosclerotic heart disease of native coronary artery without angina pectoris: Secondary | ICD-10-CM | POA: Diagnosis present

## 2017-03-23 DIAGNOSIS — Z8673 Personal history of transient ischemic attack (TIA), and cerebral infarction without residual deficits: Secondary | ICD-10-CM | POA: Diagnosis not present

## 2017-03-23 DIAGNOSIS — E869 Volume depletion, unspecified: Secondary | ICD-10-CM | POA: Diagnosis present

## 2017-03-23 DIAGNOSIS — D649 Anemia, unspecified: Secondary | ICD-10-CM | POA: Diagnosis present

## 2017-03-23 DIAGNOSIS — I4892 Unspecified atrial flutter: Secondary | ICD-10-CM | POA: Diagnosis present

## 2017-03-23 DIAGNOSIS — Z8249 Family history of ischemic heart disease and other diseases of the circulatory system: Secondary | ICD-10-CM | POA: Diagnosis not present

## 2017-03-23 HISTORY — DX: Heart failure, unspecified: I50.9

## 2017-03-23 HISTORY — DX: Atherosclerotic heart disease of native coronary artery without angina pectoris: I25.10

## 2017-03-23 HISTORY — DX: Chronic obstructive pulmonary disease, unspecified: J44.9

## 2017-03-23 HISTORY — DX: Acute myocardial infarction, unspecified: I21.9

## 2017-03-23 HISTORY — DX: Chronic kidney disease, stage 3 (moderate): N18.3

## 2017-03-23 HISTORY — DX: Chronic kidney disease, stage 3 unspecified: N18.30

## 2017-03-23 HISTORY — DX: Anxiety disorder, unspecified: F41.9

## 2017-03-23 HISTORY — DX: Pure hypercholesterolemia, unspecified: E78.00

## 2017-03-23 LAB — URINALYSIS, ROUTINE W REFLEX MICROSCOPIC
BILIRUBIN URINE: NEGATIVE
Glucose, UA: NEGATIVE mg/dL
KETONES UR: NEGATIVE mg/dL
Leukocytes, UA: NEGATIVE
Nitrite: NEGATIVE
PROTEIN: 30 mg/dL — AB
Specific Gravity, Urine: 1.009 (ref 1.005–1.030)
pH: 5 (ref 5.0–8.0)

## 2017-03-23 LAB — COMPREHENSIVE METABOLIC PANEL
ALT: 24 U/L (ref 17–63)
ANION GAP: 11 (ref 5–15)
AST: 21 U/L (ref 15–41)
Albumin: 4.2 g/dL (ref 3.5–5.0)
Alkaline Phosphatase: 79 U/L (ref 38–126)
BILIRUBIN TOTAL: 1 mg/dL (ref 0.3–1.2)
BUN: 69 mg/dL — AB (ref 6–20)
CO2: 21 mmol/L — ABNORMAL LOW (ref 22–32)
Calcium: 9.2 mg/dL (ref 8.9–10.3)
Chloride: 104 mmol/L (ref 101–111)
Creatinine, Ser: 5.05 mg/dL — ABNORMAL HIGH (ref 0.61–1.24)
GFR calc Af Amer: 14 mL/min — ABNORMAL LOW (ref >60)
GFR, EST NON AFRICAN AMERICAN: 12 mL/min — AB (ref >60)
Glucose, Bld: 145 mg/dL — ABNORMAL HIGH (ref 65–99)
POTASSIUM: 4.3 mmol/L (ref 3.5–5.1)
Sodium: 136 mmol/L (ref 135–145)
Total Protein: 6.9 g/dL (ref 6.5–8.1)

## 2017-03-23 LAB — CBC
HEMATOCRIT: 36.8 % — AB (ref 39.0–52.0)
Hemoglobin: 12.4 g/dL — ABNORMAL LOW (ref 13.0–17.0)
MCH: 31.6 pg (ref 26.0–34.0)
MCHC: 33.7 g/dL (ref 30.0–36.0)
MCV: 93.9 fL (ref 78.0–100.0)
PLATELETS: 154 10*3/uL (ref 150–400)
RBC: 3.92 MIL/uL — AB (ref 4.22–5.81)
RDW: 16.4 % — ABNORMAL HIGH (ref 11.5–15.5)
WBC: 6.8 10*3/uL (ref 4.0–10.5)

## 2017-03-23 LAB — CREATININE, URINE, RANDOM: CREATININE, URINE: 80.5 mg/dL

## 2017-03-23 MED ORDER — ONDANSETRON HCL 4 MG PO TABS: 4.0000 mg | ORAL_TABLET | Freq: Four times a day (QID) | ORAL | Status: DC | PRN

## 2017-03-23 MED ORDER — SODIUM CHLORIDE 0.9 % IV SOLN
INTRAVENOUS | Status: DC
  Administered 2017-03-23: 17:00:00 via INTRAVENOUS

## 2017-03-23 MED ORDER — ACETAMINOPHEN 325 MG PO TABS: 650.0000 mg | ORAL_TABLET | Freq: Four times a day (QID) | ORAL | Status: DC | PRN

## 2017-03-23 MED ORDER — HEPARIN SODIUM (PORCINE) 5000 UNIT/ML IJ SOLN
5000.0000 [IU] | Freq: Three times a day (TID) | INTRAMUSCULAR | Status: DC
  Administered 2017-03-23: 5000 [IU] via SUBCUTANEOUS
  Filled 2017-03-23: qty 1

## 2017-03-23 MED ORDER — ACETAMINOPHEN 650 MG RE SUPP: 650.0000 mg | Freq: Four times a day (QID) | RECTAL | Status: DC | PRN

## 2017-03-23 MED ORDER — HYDROCODONE-ACETAMINOPHEN 5-325 MG PO TABS: 1.0000 | ORAL_TABLET | ORAL | Status: DC | PRN

## 2017-03-23 MED ORDER — ONDANSETRON HCL 4 MG/2ML IJ SOLN: 4.0000 mg | Freq: Four times a day (QID) | INTRAMUSCULAR | Status: DC | PRN

## 2017-03-23 MED ORDER — FAMOTIDINE 20 MG PO TABS
20.0000 mg | ORAL_TABLET | Freq: Every day | ORAL | Status: DC
  Administered 2017-03-23: 20 mg via ORAL
  Filled 2017-03-23: qty 1

## 2017-03-23 MED ORDER — SENNOSIDES-DOCUSATE SODIUM 8.6-50 MG PO TABS
1.0000 | ORAL_TABLET | Freq: Every day | ORAL | Status: DC
  Administered 2017-03-23: 1 via ORAL
  Filled 2017-03-23: qty 1

## 2017-03-23 NOTE — ED Provider Notes (Signed)
[facility]-EMERGENCY DEPT Provider Note   CSN: [id number] Arrival date & time: 03/23/17  1321     History   Chief Complaint Chief Complaint  Patient presents with  . Abnormal Lab    HPI Christopher [name] is a 56 y.o. male.  56 year old male presents due to abnormal labs. Patient is BUN creatinine are elevated from his baseline. He denies any complaints of shortness of breath or peripheral edema. Notes that he has had normal urination. No abdominal discomfort. Does chronically take spironolactone as well as Demadex and these have been unchanged. He is not short of breath. Seen in the ED 6 days ago and had elevated creatinine at that time 2.47. His baseline runs around 2. Follow-up with his doctor who ordered an outpatient creatinine which is now 5.05. Was sent to for further management      Past Medical History:  Diagnosis Date  . Complete heart block (HCC) 10/20/2015  . Hypertension   . Stenosis of right carotid artery   . TIA (transient ischemic attack)     Patient Active Problem List   Diagnosis Date Noted  . Chronic systolic CHF (congestive heart failure) (HCC) 02/07/2016  . Hypokalemia 12/01/2015  . Debility 11/24/2015  . Coronary artery disease involving native coronary artery of native heart without angina pectoris   . VT (ventricular tachycardia) (HCC)   . Ileus (HCC)   . Hyperosmolality and/or hypernatremia   . Acute respiratory failure with hypoxia (HCC)   . Chronic obstructive pulmonary disease (HCC)   . Acute renal failure (HCC)   . Ventilator dependence (HCC)   . Tracheostomy status (HCC)   . S/P CABG x 4 10/27/2015  . CAD (coronary artery disease)   . Hypertensive heart disease with heart failure (HCC)   . Carotid artery stenosis   . Acute combined systolic and diastolic heart failure (HCC) 10/20/2015  . Complete heart block (HCC) 10/20/2015  . Acute MI, inferior wall, initial episode of care (HCC) 10/20/2015  . TIA (transient ischemic attack) 12/25/2014    . Essential hypertension 12/25/2014  . Hyperlipidemia 12/25/2014    Past Surgical History:  Procedure Laterality Date  . arm surgery Right 2003  . CARDIAC CATHETERIZATION N/A 10/20/2015   Procedure: Left Heart Cath and Coronary Angiography;  Surgeon: Tonny Bollman, MD;  Location: [facility] INVASIVE CV LAB;  Service: Cardiovascular;  Laterality: N/A;  . CARDIAC CATHETERIZATION N/A 11/02/2015   Procedure: Right/Left Heart Cath and Coronary/Graft Angiography;  Surgeon: Laurey Morale, MD;  Location: [facility] INVASIVE CV LAB;  Service: Cardiovascular;  Laterality: N/A;  . CORONARY ARTERY BYPASS GRAFT N/A 10/27/2015   Procedure: CORONARY ARTERY BYPASS GRAFTING (CABG) x four, using left internal mammary artery and rt leg greater saphenous vein harvested endoscopically;  Surgeon: Kerin Perna, MD;  Location: [facility] OR;  Service: Open Heart Surgery;  Laterality: N/A;  . ENDARTERECTOMY Right 10/27/2015   Procedure: RIGHT ENDARTERECTOMY CAROTID with patch angioplasty using Xenosure bovine pericardium patch;  Surgeon: Fransisco Hertz, MD;  Location: [facility] OR;  Service: Vascular;  Laterality: Right;  . EYE SURGERY    . LACERATION REPAIR    . TEE WITHOUT CARDIOVERSION N/A 10/27/2015   Procedure: TRANSESOPHAGEAL ECHOCARDIOGRAM (TEE);  Surgeon: Kerin Perna, MD;  Location: [facility] OR;  Service: Open Heart Surgery;  Laterality: N/A;       Home Medications    Prior to Admission medications   Medication Sig Start Date End Date Taking? Authorizing Provider  apixaban (ELIQUIS) 5 MG TABS tablet Take 1  tablet (5 mg total) by mouth 2 (two) times daily. 02/06/17   Laurey Morale, MD  carvedilol (COREG) 12.5 MG tablet Take 1 tablet (12.5 mg total) by mouth 2 (two) times daily with a meal. 02/20/17   Clegg, Amy D, NP  carvedilol (COREG) 12.5 MG tablet TAKE ONE TABLET BY MOUTH TWICE DAILY WITH A MEAL. 03/20/17   Bensimhon, Bevelyn Buckles, MD  DULoxetine (CYMBALTA) 30 MG capsule Take 30 mg by mouth daily.    [provider]   gabapentin (NEURONTIN) 100 MG capsule Take 100 mg by mouth 3 (three) times daily as needed.    [provider]  NEOMYCIN-POLYMYXIN-HYDROCORTISONE (CORTISPORIN) 1 % SOLN otic solution Apply 1-2 drops to toe BID after soaking Patient not taking: Reported on 03/17/2017 04/07/16   Hyatt, Max T, DPM  pantoprazole (PROTONIX) 40 MG tablet Take 1 tablet (40 mg total) by mouth daily. Patient not taking: Reported on 03/17/2017 12/30/15   Laurey Morale, MD  potassium chloride SA (K-DUR,KLOR-CON) 20 MEQ tablet Take 1 tablet (20 mEq total) by mouth daily. 01/25/17   Laurey Morale, MD  QUEtiapine (SEROQUEL) 25 MG tablet Take 25 mg by mouth at bedtime.    [provider]  ranitidine (ZANTAC) 150 MG tablet Take 150 mg by mouth 2 (two) times daily.    [provider]  rosuvastatin (CRESTOR) 40 MG tablet TAKE 1 TABLET BY MOUTH ONCE DAILY 03/20/17   Bensimhon, Bevelyn Buckles, MD  sacubitril-valsartan (ENTRESTO) 24-26 MG Take 1 tablet by mouth 2 (two) times daily. 02/06/17   Laurey Morale, MD  senna-docusate (SENOKOT-S) 8.6-50 MG tablet Take 1 tablet by mouth at bedtime. For constipation 12/01/15   Love, Evlyn Kanner, PA-C  spironolactone (ALDACTONE) 25 MG tablet Take 1 tablet (25 mg total) by mouth daily. 12/06/16   Laurey Morale, MD  torsemide [facility]) 20 MG tablet Take 2 tabs every other day ALTERNATING with 3 tabs every other day 02/06/17   Laurey Morale, MD    Family History Family History  Problem Relation Age of Onset  . Heart disease Mother   . Failure to thrive Father   . Congestive Heart Failure Father        Deceased  . Breast cancer Sister        Living    Social History Social History  Substance Use Topics  . Smoking status: Former Smoker    Quit date: 03/23/2002  . Smokeless tobacco: Never Used  . 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    Physical Exam Updated Vital Signs BP 115/67   Pulse (!) 52   Temp 97.9 F (36.6 C) (Oral)   Resp 16   Ht 1.791 m (5' 10.5")   Wt 125.2 kg (276 lb 2 oz)   SpO2 99%   BMI 39.06 kg/m   Physical Exam  Constitutional: He is oriented to person, place, and time. He appears well-developed and well-nourished.  Non-toxic appearance. No distress.  HENT:  Head: Normocephalic and atraumatic.  Eyes: Pupils are equal, round, and reactive to light. Conjunctivae, EOM and lids are normal.  Neck: Normal range of motion. Neck supple. No tracheal deviation present. No thyroid mass present.  Cardiovascular: Normal rate, regular rhythm and normal  heart sounds.  Exam reveals no gallop.   No murmur heard. Pulmonary/Chest: Effort normal and breath sounds normal. No stridor. No respiratory distress. He has no decreased breath sounds. He has no wheezes. He has no rhonchi. He has no rales.  Abdominal: Soft. Normal appearance and bowel sounds are normal. He exhibits no distension. There is no tenderness. There is no rebound and no CVA tenderness.  Musculoskeletal: Normal range of motion. He exhibits no edema or tenderness.  Neurological: He is alert and oriented to person, place, and time. He has normal strength. No cranial nerve deficit or sensory deficit. GCS eye subscore is 4. GCS verbal subscore is 5. GCS motor subscore is 6.  Skin: Skin is warm and dry. No abrasion and no rash noted.  Psychiatric: He has a normal mood and affect. His speech is normal and behavior is normal.  Nursing note and vitals reviewed.    ED Treatments / Results  Labs (all labs ordered are listed, but only abnormal results are displayed) Labs Reviewed  COMPREHENSIVE METABOLIC PANEL - Abnormal; Notable for the following:       Result Value   CO2 21 (*)    Glucose, Bld 145 (*)    BUN 69 (*)    Creatinine, Ser 5.05 (*)    GFR  calc non Af Amer 12 (*)    GFR calc Af Amer 14 (*)    All other components within normal limits  CBC - Abnormal; Notable for the following:    RBC 3.92 (*)    Hemoglobin 12.4 (*)    HCT 36.8 (*)    RDW 16.4 (*)    All other components within normal limits  URINALYSIS, ROUTINE W REFLEX MICROSCOPIC    EKG  EKG Interpretation None       Radiology No results found.  Procedures Procedures (including critical care time)  Medications Ordered in ED Medications  0.9 %  sodium chloride infusion (not administered)     Initial Impression / Assessment and Plan / ED Course  I have reviewed the triage vital signs and the nursing notes.  Pertinent labs & imaging results that were available during my care of the patient were reviewed by me and considered in my medical decision making (see chart for details).     Spoke with Dr. with from nephrology who recommends patient renal ultrasound and gentle hydration and admission to the medicine service  Final Clinical Impressions(s) / ED Diagnoses   Final diagnoses:  None    New Prescriptions New Prescriptions   No medications on file     Lorre Nick, MD 03/23/17 1623

## 2017-03-23 NOTE — H&P (Addendum)
History and Physical        Hospital Admission Note Date: 03/23/2017  Patient name: Christopher [name] Medical record number: [id number] Date of birth: 09/22/1960 Age: 56 y.o. Gender: male  PCP: Shirlean Mylar, MD   I have reviewed all records in the [facility].    Chief Complaint:  Abnormal labs  HPI: Patient is a 56 year old male with hypertension, chronic combined systolic and diastolic CHF EF 78-29% and grade 2 diastolic dysfunction follows Dr. Shirlee Latch, hypertension, CAD status post CABG 4, history of TIA, COPD, neuropathy on high-dose Neurontin, was recently seen in the ED on 8/10 for feeling 'terrible', nauseous, palpitations and poor by mouth intake. He was thought to have viral URI and anxiety. He was also thought to have possibility of gabapentin withdrawal as he had stopped the medication abruptly last week. BMET showed creatinine of 2.47, baseline 1.7-1.8. He was given 500 mL of fluid bolus and was discharged home on gabapentin to begin tapering down off gabapentin with his PCPs advice. He was recommended Cymbalta and tramadol by PCP however he had not taken it. Patient chronically takes spironolactone, Demadex, and entresto and followed by Dr. Shirlee Latch, no recent changes in these medications. He took all his medications today. Otherwise he denied any nausea, vomiting, diarrhea.  ED work-up/course:   Review of Systems: Positives marked in 'bold' Constitutional: Denies fever, chills, diaphoresis, poor appetite and fatigue.  HEENT: Denies photophobia, eye pain, redness, hearing loss, ear pain, congestion, sore throat, rhinorrhea, sneezing, mouth sores, trouble swallowing, neck pain, neck stiffness and tinnitus.   Respiratory: Denies SOB, DOE, cough, chest tightness,  and wheezing.   Cardiovascular: Denies chest pain, palpitations and leg swelling.  Gastrointestinal:  Denies nausea, vomiting, abdominal pain, diarrhea, constipation, blood in stool and abdominal distention.  Genitourinary: Denies dysuria, urgency, frequency, hematuria, flank pain and difficulty urinating.  Musculoskeletal: Denies myalgias, back pain, joint swelling, arthralgias and gait problem.  Skin: Denies pallor, rash and wound.  Neurological: Feeling weak and poorly today. No seizures or syncope any numbness or tingling. Hematological: Denies adenopathy. Easy bruising, personal or family bleeding history  Psychiatric/Behavioral: Denies suicidal ideation, mood changes, confusion, nervousness, sleep disturbance and agitation  Past Medical History: Past Medical History:  Diagnosis Date  . Complete heart block (HCC) 10/20/2015  . Hypertension   . Stenosis of right carotid artery   . TIA (transient ischemic attack)     Past Surgical History:  Procedure Laterality Date  . arm surgery Right 2003  . CARDIAC CATHETERIZATION N/A 10/20/2015   Procedure: Left Heart Cath and Coronary Angiography;  Surgeon: Tonny Bollman, MD;  Location: [facility] INVASIVE CV LAB;  Service: Cardiovascular;  Laterality: N/A;  . CARDIAC CATHETERIZATION N/A 11/02/2015   Procedure: Right/Left Heart Cath and Coronary/Graft Angiography;  Surgeon: Laurey Morale, MD;  Location: [facility] INVASIVE CV LAB;  Service: Cardiovascular;  Laterality: N/A;  . CORONARY ARTERY BYPASS GRAFT N/A 10/27/2015   Procedure: CORONARY ARTERY BYPASS GRAFTING (CABG) x four, using left internal mammary artery and rt leg greater saphenous vein harvested endoscopically;  Surgeon: Kerin Perna, MD;  Location: [facility] OR;  Service: Open Heart Surgery;  Laterality: N/A;  . ENDARTERECTOMY Right 10/27/2015  Procedure: RIGHT ENDARTERECTOMY CAROTID with patch angioplasty using Xenosure bovine pericardium patch;  Surgeon: Fransisco Hertz, MD;  Location: [facility] OR;  Service: Vascular;  Laterality: Right;  . EYE SURGERY    . LACERATION REPAIR    . TEE WITHOUT CARDIOVERSION N/A  10/27/2015   Procedure: TRANSESOPHAGEAL ECHOCARDIOGRAM (TEE);  Surgeon: Kerin Perna, MD;  Location: [facility] OR;  Service: Open Heart Surgery;  Laterality: N/A;    Medications: Prior to Admission medications   Medication Sig Start Date End Date Taking? Authorizing Provider  apixaban (ELIQUIS) 5 MG TABS tablet Take 1 tablet (5 mg total) by mouth 2 (two) times daily. 02/06/17  Yes Laurey Morale, MD  carvedilol (COREG) 12.5 MG tablet Take 1 tablet (12.5 mg total) by mouth 2 (two) times daily with a meal. 02/20/17  Yes Clegg, Amy D, NP  DULoxetine (CYMBALTA) 30 MG capsule Take 30 mg by mouth daily.   Yes [provider]  gabapentin (NEURONTIN) 600 MG tablet Take 600 mg by mouth 3 (three) times daily.    Yes [provider]  potassium chloride SA (K-DUR,KLOR-CON) 20 MEQ tablet Take 1 tablet (20 mEq total) by mouth daily. 01/25/17  Yes Laurey Morale, MD  QUEtiapine (SEROQUEL) 25 MG tablet Take 12.5-25 mg by mouth at bedtime.    Yes [provider]  ranitidine (ZANTAC) 150 MG tablet Take 150 mg by mouth 2 (two) times daily.   Yes [provider]  sacubitril-valsartan (ENTRESTO) 24-26 MG Take 1 tablet by mouth 2 (two) times daily. 02/06/17  Yes Laurey Morale, MD  senna-docusate (SENOKOT-S) 8.6-50 MG tablet Take 1 tablet by mouth at bedtime. For constipation 12/01/15  Yes Love, Evlyn Kanner, PA-C  spironolactone (ALDACTONE) 25 MG tablet Take 1 tablet (25 mg total) by mouth daily. 12/06/16  Yes Laurey Morale, MD  torsemide (DEMADEX) 20 MG tablet Take 2 tabs every other day ALTERNATING with 3 tabs every other day Patient taking differently: Take 20 mg by mouth See admin instructions. Take 2 tabs every other day ALTERNATING with 3 tabs every other day 02/06/17  Yes Laurey Morale, MD  NEOMYCIN-POLYMYXIN-HYDROCORTISONE (CORTISPORIN) 1 % SOLN otic solution Apply 1-2 drops to toe BID after soaking Patient not taking: Reported on 03/17/2017 04/07/16   Hyatt, Max T, DPM    pantoprazole (PROTONIX) 40 MG tablet Take 1 tablet (40 mg total) by mouth daily. Patient not taking: Reported on 03/17/2017 12/30/15   Laurey Morale, MD  rosuvastatin (CRESTOR) 40 MG tablet TAKE 1 TABLET BY MOUTH ONCE DAILY Patient taking differently: TAKE 40 mg TABLET BY MOUTH ONCE DAILY 03/20/17   Bensimhon, Bevelyn Buckles, MD    Allergies:   Allergies  Allergen Reactions  . Losartan Shortness Of Breath  . Elita Quick [Ceftazidime] Rash    Rash with blisters  . Lisinopril Rash    Social History:  reports that he quit smoking about 15 years ago. He has never used smokeless tobacco. He reports that he uses drugs, including Marijuana. He reports that he does not drink alcohol.  Family History: Family History  Problem Relation Age of Onset  . Heart disease Mother   . Failure to thrive Father   . Congestive Heart Failure Father        Deceased  . Breast cancer Sister        Living    Physical Exam: Blood pressure 120/73, pulse (!) 54, temperature 97.9 F (36.6 C), temperature source Oral, resp. rate 16, height 5' 10.5" (1.791  m), weight 125.2 kg (276 lb 2 oz), SpO2 99 %. General: Alert, awake, oriented x3, in no acute distress. Eyes: pink conjunctiva,anicteric sclera, pupils equal and reactive to light and accomodation, HEENT: normocephalic, atraumatic, oropharynx clear Neck: supple, no masses or lymphadenopathy, no goiter, no bruits, no JVD CVS: Regular rate and rhythm, without murmurs, rubs or gallops. No lower extremity edema Resp : Clear to auscultation bilaterally, no wheezing, rales or rhonchi. GI : Obese, Soft, nontender, nondistended, positive bowel sounds, no masses. No hepatomegaly. No hernia.  Musculoskeletal: No clubbing or cyanosis, positive pedal pulses. No contracture. ROM intact  Neuro: Grossly intact, no focal neurological deficits, strength 5/5 upper and lower extremities bilaterally Psych: alert and oriented x 3, normal mood and affect Skin: no rashes or lesions,  warm and dry   LABS on Admission: I have personally reviewed all the labs and imagings below    Basic Metabolic Panel:  Recent Labs Lab 03/17/17 1209 03/23/17 1405  NA 136 136  K 5.0 4.3  CL 102 104  CO2 23 21*  GLUCOSE 131* 145*  BUN 39* 69*  CREATININE 2.47* 5.05*  CALCIUM 10.1 9.2   Liver Function Tests:  Recent Labs Lab 03/23/17 1405  AST 21  ALT 24  ALKPHOS 79  BILITOT 1.0  PROT 6.9  ALBUMIN 4.2   No results for input(s): LIPASE, AMYLASE in the last 168 hours. No results for input(s): AMMONIA in the last 168 hours. CBC:  Recent Labs Lab 03/17/17 1209 03/23/17 1405  WBC 9.1 6.8  HGB 14.8 12.4*  HCT 44.2 36.8*  MCV 94.8 93.9  PLT 181 154   Cardiac Enzymes: No results for input(s): CKTOTAL, CKMB, CKMBINDEX, TROPONINI in the last 168 hours. BNP: Invalid input(s): POCBNP CBG: No results for input(s): GLUCAP in the last 168 hours.  Radiological Exams on Admission:  No results found.    EKG: Independently reviewed.Rate 58, atrial flutter   Assessment/Plan Principal Problem:   Acute kidney injury (HCC) with underlying chronic kidney disease stage III - Baseline appears to be 1.7-2.0, creatinine currently 5.0, BUN 69 - Unclear what precipitated Acute renal failure, patient had been on 1800 mg of Neurontin however his wife states that he had been taking this dose for years, recently started on Cymbalta and tramadol. He also has been taking Aldactone, torsemide, and entresto, poor by mouth intake in the last few days. - will hold all the cardiac medications and Neurontin, Cymbalta, tramadol - Placed on gentle hydration, obtain renal ultrasound - Nephrology consulted, Dr. Hyman Hopes to see the patient - UA showed hyaline casts, obtain urine eosinophils, urine sodium, urine creatinine, SPEP    Active Problems:   Essential hypertension - Currently BP on the softer side, 106/67, hold all cardiac medications including beta blocker, heart rate 54    Chronic  obstructive pulmonary disease (HCC) - Currently stable, no wheezing     Coronary artery disease involving native coronary artery of native heart without angina pectoris - Currently stable, no chest pain, hold all cardiac medications    Chronic systolic CHF (congestive heart failure) (HCC) - Hold and Restoril, Aldactone, torsemide, placed on gentle hydration - Cardiology consulted, will notify Dr Shirlee Latch, sent message to Salley Hews via inbox   Atrial flutter - Currently has soft BP and bradycardia, hence BB held but will restart as BP improved  - given Cr 5.0 and he has taken all his cardiac meds today, I will await cardiology recommendations regarding anticoagulation with NOAC in AKI.  DVT prophylaxis: Heparin subcutaneous  CODE STATUS: Full code  Consults called: Nephrology, cardiology  Family Communication: Admission, patients condition and plan of care including tests being ordered have been discussed with the patient andwife who indicates understanding and agree with the plan and Code Status  Admission status: Inpatient  Disposition plan: Further plan will depend as patient's clinical course evolves and further radiologic and laboratory data become available.    At the time of admission, it appears that the appropriate admission status for this patient is INPATIENT . This is judged to be reasonable and necessary in order to provide the required intensity of service to ensure the patient's safety given the presenting symptoms, physical exam findings, and initial radiographic and laboratory data elevated BUN, creatinine of 5.0  in the context of their chronic comorbidities.  The medical decision making on this patient was of high complexity and the patient is at high risk for clinical deterioration, therefore this is a level 3 visit.   Time Spent on Admission:     Ripudeep Rai M.D. [facility] 03/23/2017, 5:51 PM Pager: [phone]  If 7PM-7AM, please  contact night-coverage [email or web address] Password [email or web address]

## 2017-03-23 NOTE — ED Notes (Signed)
Patient denies pain and is resting comfortably.  

## 2017-03-23 NOTE — ED Notes (Signed)
Do not take patient to renal U/S before hospitalist comes to see patient per dr Freida Busman

## 2017-03-23 NOTE — Telephone Encounter (Signed)
I called patient to schedule for cardiac rehab. Patient voicemail is full and unable to leave message.

## 2017-03-23 NOTE — ED Triage Notes (Signed)
Pt sent here for elevated kidney levels per PCP; pt denies current complaint

## 2017-03-24 ENCOUNTER — Telehealth (HOSPITAL_COMMUNITY): Payer: Self-pay | Admitting: *Deleted

## 2017-03-24 LAB — BASIC METABOLIC PANEL
Anion gap: 9 (ref 5–15)
BUN: 61 mg/dL — ABNORMAL HIGH (ref 6–20)
CALCIUM: 9 mg/dL (ref 8.9–10.3)
CO2: 22 mmol/L (ref 22–32)
CREATININE: 3.65 mg/dL — AB (ref 0.61–1.24)
Chloride: 106 mmol/L (ref 101–111)
GFR calc Af Amer: 20 mL/min — ABNORMAL LOW (ref >60)
GFR, EST NON AFRICAN AMERICAN: 17 mL/min — AB (ref >60)
GLUCOSE: 93 mg/dL (ref 65–99)
Potassium: 4 mmol/L (ref 3.5–5.1)
SODIUM: 137 mmol/L (ref 135–145)

## 2017-03-24 LAB — PROTEIN ELECTROPHORESIS, SERUM
A/G Ratio: 1.1 (ref 0.7–1.7)
Albumin ELP: 3.8 g/dL (ref 2.9–4.4)
Alpha-1-Globulin: 0.2 g/dL (ref 0.0–0.4)
Alpha-2-Globulin: 1 g/dL (ref 0.4–1.0)
Beta Globulin: 1.1 g/dL (ref 0.7–1.3)
GAMMA GLOBULIN: 1.1 g/dL (ref 0.4–1.8)
GLOBULIN, TOTAL: 3.5 g/dL (ref 2.2–3.9)
TOTAL PROTEIN ELP: 7.3 g/dL (ref 6.0–8.5)

## 2017-03-24 LAB — HIV ANTIBODY (ROUTINE TESTING W REFLEX): HIV SCREEN 4TH GENERATION: NONREACTIVE

## 2017-03-24 LAB — CBC
HEMATOCRIT: 37 % — AB (ref 39.0–52.0)
Hemoglobin: 12.3 g/dL — ABNORMAL LOW (ref 13.0–17.0)
MCH: 31.5 pg (ref 26.0–34.0)
MCHC: 33.2 g/dL (ref 30.0–36.0)
MCV: 94.9 fL (ref 78.0–100.0)
PLATELETS: 143 10*3/uL — AB (ref 150–400)
RBC: 3.9 MIL/uL — ABNORMAL LOW (ref 4.22–5.81)
RDW: 16.5 % — ABNORMAL HIGH (ref 11.5–15.5)
WBC: 6.1 10*3/uL (ref 4.0–10.5)

## 2017-03-24 LAB — CK: CK TOTAL: 324 U/L (ref 49–397)

## 2017-03-24 MED ORDER — GABAPENTIN 300 MG PO CAPS: 300.0000 mg | ORAL_CAPSULE | Freq: Two times a day (BID) | ORAL | 1 refills | Status: DC

## 2017-03-24 MED ORDER — CARVEDILOL 3.125 MG PO TABS: 3.1250 mg | ORAL_TABLET | Freq: Two times a day (BID) | ORAL | 2 refills | Status: DC

## 2017-03-24 MED ORDER — TORSEMIDE 20 MG PO TABS: 20.0000 mg | ORAL_TABLET | Freq: Every day | ORAL | 3 refills | Status: DC

## 2017-03-24 MED ORDER — APIXABAN 5 MG PO TABS
5.0000 mg | ORAL_TABLET | Freq: Two times a day (BID) | ORAL | Status: DC
  Administered 2017-03-24: 5 mg via ORAL
  Filled 2017-03-24: qty 1

## 2017-03-24 NOTE — Discharge Summary (Signed)
Physician Discharge Summary   Patient ID: Christopher [name] MRN: [id number] DOB/AGE: 1961/04/20 56 y.o.  Admit date: 03/23/2017 Discharge date: 03/24/2017  Primary Care Physician:  Shirlean Mylar, MD  Discharge Diagnoses:    . Acute kidney injury (HCC) . CAD (coronary artery disease) . Chronic obstructive pulmonary disease (HCC) . Chronic systolic CHF (congestive heart failure) (HCC) . Coronary artery disease involving native coronary artery of native heart without angina pectoris . Essential hypertension . Hyperosmolality and/or hypernatremia   Consults:  Cardiology CHF team Nephrology, Dr Hyman Hopes   Recommendations for Outpatient Follow-up:  1. Please repeat BMET at next visit   DIET: Heart healthy diet    Allergies:   Allergies  Allergen Reactions  . Losartan Shortness Of Breath  . Elita Quick [Ceftazidime] Rash    Rash with blisters  . Lisinopril Rash     DISCHARGE MEDICATIONS: Discharge Medication List as of 03/24/2017 11:06 AM    START taking these medications   Details  gabapentin (NEURONTIN) 300 MG capsule Take 1 capsule (300 mg total) by mouth 2 (two) times daily., Starting Fri 03/24/2017, Print      CONTINUE these medications which have CHANGED   Details  carvedilol (COREG) 3.125 MG tablet Take 1 tablet (3.125 mg total) by mouth 2 (two) times daily. Start if BP above 120's/ 80's, Starting Sat 03/25/2017, Until Sun 03/25/2018, Print    torsemide (DEMADEX) 20 MG tablet Take 1 tablet (20 mg total) by mouth daily. Start on 03/26/17, Starting Sun 03/26/2017, Print      CONTINUE these medications which have NOT CHANGED   Details  apixaban (ELIQUIS) 5 MG TABS tablet Take 1 tablet (5 mg total) by mouth 2 (two) times daily., Starting Mon 02/06/2017, Normal    DULoxetine (CYMBALTA) 30 MG capsule Take 30 mg by mouth daily., Historical Med    QUEtiapine (SEROQUEL) 25 MG tablet Take 12.5-25 mg by mouth at bedtime. , Historical Med    ranitidine (ZANTAC) 150 MG tablet Take  150 mg by mouth 2 (two) times daily., Historical Med    senna-docusate (SENOKOT-S) 8.6-50 MG tablet Take 1 tablet by mouth at bedtime. For constipation, Starting Tue 12/01/2015, Normal    rosuvastatin (CRESTOR) 40 MG tablet TAKE 1 TABLET BY MOUTH ONCE DAILY, Normal      STOP taking these medications     gabapentin (NEURONTIN) 600 MG tablet      potassium chloride SA (K-DUR,KLOR-CON) 20 MEQ tablet      sacubitril-valsartan (ENTRESTO) 24-26 MG      spironolactone (ALDACTONE) 25 MG tablet      NEOMYCIN-POLYMYXIN-HYDROCORTISONE (CORTISPORIN) 1 % SOLN otic solution      pantoprazole (PROTONIX) 40 MG tablet          Brief H and P: For complete details please refer to admission H and P, but in brief Patient is a 56 year old male with hypertension, chronic combined systolic and diastolic CHF EF 13-08% and grade 2 diastolic dysfunction follows Dr. Shirlee Latch, hypertension, CAD status post CABG 4, history of TIA, COPD, neuropathy on high-dose Neurontin, was recently seen in the ED on 8/10 for feeling 'terrible', nauseous, palpitations and poor by mouth intake. He was thought to have viral URI and anxiety. He was also thought to have possibility of gabapentin withdrawal as he had stopped the medication abruptly last week. BMET showed creatinine of 2.47, baseline 1.7-1.8. He was given 500 mL of fluid bolus and was discharged home on gabapentin to begin tapering down off gabapentin with his PCPs advice. He was  recommended Cymbalta and tramadol by PCP however he had not taken it. Patient chronically takes spironolactone, Demadex, and entresto and followed by Dr. Shirlee Latch, no recent changes in these medications. He took all his medications on the day of admission. Otherwise he denied any nausea, vomiting, diarrhea.   Hospital Course:   Acute kidney injury [facility]) with underlying chronic kidney disease stage III - Baseline appears to be 1.7-2.0, creatinine currently 5.0, BUN 69 - Unclear what precipitated  Acute renal failure, patient had been on 1800 mg of Neurontin however his wife states that he had been taking this dose for years, recently started on Cymbalta and tramadol. He also has been taking Aldactone, torsemide, and entresto, poor by mouth intake in the last few days. - All his cardiac medications including Neurontin, Cymbalta were held.  - Patient was placed on IV fluid hydration. Renal ultrasound did not show any obstruction. Nephrology and cardiology were consulted. - CK 324, urine Bland, creatinine improved to 3.65. Both nephrology and cardiology recommended to hold entresto, spironolactone. Dr. Shirlee Latch recommended to start low-dose torsemide 20 mg on Sunday 8/19. Recommended to start Coreg very low-dose 3.125 mg twice a day if BP tolerates. - Patient will be seen on Monday at the CHF clinic and they will check BMET for the renal function as well. - Discussed in detail with patient's wife, Neurontin decreased to 300 mg twice a day, continue Cymbalta. Per patient's wife, his PCP, Dr. Hyman Hopes had been trying to taper down Neurontin when he got started on Cymbalta.      Essential hypertension - Patient's BP was on the softer side, hence all cardiac medications were held, see #1    Chronic obstructive pulmonary disease (HCC) - Currently stable, no wheezing, quit smoking in 2003-2004     Coronary artery disease involving native coronary artery of native heart without angina pectoris - Currently stable, no chest pain    Chronic systolic CHF (congestive heart failure) (HCC) -Patient will restart low-dose torsemide 20 mg daily on 8/19 continue to hold spironolactone, entresto  Atrial flutter - Beta blocker was placed on hold while inpatient, patient will restart very low-dose of Coreg 3.125 twice a day only if BP tolerates. Discussed in detail with pharmacy, with his weight, he is okay to start eliquis at current dose.    Day of Discharge BP 100/61 (BP Location: Left Arm)   Pulse  (!) 55   Temp 97.9 F (36.6 C) (Oral)   Resp 15   Ht 5' 10.5" (1.791 m)   Wt 125.4 kg (276 lb 8 oz)   SpO2 98%   BMI 39.11 kg/m   Physical Exam: General: Alert and awake oriented x3 not in any acute distress. HEENT: anicteric sclera, pupils reactive to light and accommodation CVS: S1-S2 clear no murmur rubs or gallops Chest: clear to auscultation bilaterally, no wheezing rales or rhonchi Abdomen: soft nontender, nondistended, normal bowel sounds Extremities: no cyanosis, clubbing or edema noted bilaterally Neuro: Cranial nerves II-XII intact, no focal neurological deficits   The results of significant diagnostics from this hospitalization (including imaging, microbiology, ancillary and laboratory) are listed below for reference.    LAB RESULTS: Basic Metabolic Panel:  Recent Labs Lab 03/23/17 1405 03/24/17 0339  NA 136 137  K 4.3 4.0  CL 104 106  CO2 21* 22  GLUCOSE 145* 93  BUN 69* 61*  CREATININE 5.05* 3.65*  CALCIUM 9.2 9.0   Liver Function Tests:  Recent Labs Lab 03/23/17 1405  AST 21  ALT  24  ALKPHOS 79  BILITOT 1.0  PROT 6.9  ALBUMIN 4.2   No results for input(s): LIPASE, AMYLASE in the last 168 hours. No results for input(s): AMMONIA in the last 168 hours. CBC:  Recent Labs Lab 2017-04-13 1405 03/24/17 0339  WBC 6.8 6.1  HGB 12.4* 12.3*  HCT 36.8* 37.0*  MCV 93.9 94.9  PLT 154 143*   Cardiac Enzymes:  Recent Labs Lab 03/24/17 0743  CKTOTAL 324   BNP: Invalid input(s): POCBNP CBG: No results for input(s): GLUCAP in the last 168 hours.  Significant Diagnostic Studies:  US Renal  Result Date: 04-13-17 CLINICAL DATA:  Acute kidney injury chronic kidney disease stage 3 EXAM: RENAL / URINARY TRACT ULTRASOUND COMPLETE COMPARISON:  None. FINDINGS: Right Kidney: Length: 9 cm. Echogenicity within normal limits. No mass or hydronephrosis visualized. Left Kidney: Length: 9.6 cm. Echogenicity within normal limits. No mass or hydronephrosis  visualized. Bladder: Appears normal for degree of bladder distention. IMPRESSION: Negative renal ultrasound Electronically Signed   By: Jasmine Pang M.D.   On: April 13, 2017 23:17    2D ECHO:   Disposition and Follow-up: Discharge Instructions    (HEART FAILURE PATIENTS) Call MD:  Anytime you have any of the following symptoms: 1) 3 pound weight gain in 24 hours or 5 pounds in 1 week 2) shortness of breath, with or without a dry hacking cough 3) swelling in the hands, feet or stomach 4) if you have to sleep on extra pillows at night in order to breathe.    Complete by:  As directed    Diet - low sodium heart healthy    Complete by:  As directed    Increase activity slowly    Complete by:  As directed        DISPOSITION:  Home    DISCHARGE FOLLOW-UP Follow-up Information    Laurey Morale, MD Follow up on 03/28/2017.   Specialty:  Cardiology Why:  at 10:30am for post hospital follow up. Please bring all medications to your visit. The code for parking is 7001. Contact information: [address] [place] [address] [phone]        Elvis Coil, MD. Schedule an appointment as soon as possible for a visit in 2 week(s).   Specialty:  Nephrology Contact information: [address] [place] [address] [phone]            Time spent on Discharge:   Signed:   Thad Ranger M.D. [facility] 03/24/2017, 11:48 AM Pager: [phone]

## 2017-03-24 NOTE — Consult Note (Signed)
Referring Provider: No ref. provider found Primary Care Physician:  Shirlean Mylar, MD Primary Nephrologist:  Dr. Elvis Coil   Reason for Consultation:  Acute renal failure  Chronic renal failure  Volume depletion   HPI:   56 year old male with hypertension, chronic combined systolic and diastolic CHF EF 16-10% and grade 2 diastolic dysfunction follows Dr. Shirlee Latch, hypertension, CAD status post CABG 4, history of TIA, COPD, neuropathy on high-dose Neurontin, was recently seen in the ED on 8/10 for feeling 'terrible', nauseous, palpitations and poor by mouth intake. He was thought to have viral URI and anxiety. He was also thought to have possibility of gabapentin withdrawal as he had stopped the medication abruptly last week. BMET showed creatinine of 2.47, baseline 1.7-1.8.  He has fairly low blood pressures and is taking spirinolactone and demedex the dose of which appears to have been increased recently by Dr Shirlee Latch. He was sent to [facility] for abnormal labs with a creatinine that had increased to over 5mg /dl  He denies any GI complaints and otherwise feels well. No NSAIDS  No Diabetes No tobacco  Occassional marajuana  Renal ultrasound no obstruction Urinalysis bland  Past Medical History:  Diagnosis Date  . Anxiety   . CHF (congestive heart failure) (HCC)    Hattie Perch 03/23/2017  . CKD (chronic kidney disease), stage III    Hattie Perch 03/23/2017  . Complete heart block (HCC) 10/20/2015  . COPD (chronic obstructive pulmonary disease) (HCC)    pt denies this hx on 03/23/2017  . Coronary artery disease   . High cholesterol   . Hypertension   . Myocardial infarction [facility]) 10/2015   "massive one; saw scar tissue of possible previous MI maybe in 2013" (03/23/2017)  . Stenosis of right carotid artery   . TIA (transient ischemic attack) 2016    Past Surgical History:  Procedure Laterality Date  . CARDIAC CATHETERIZATION N/A 10/20/2015   Procedure: Left Heart Cath and Coronary Angiography;  Surgeon:  Tonny Bollman, MD;  Location: [facility] INVASIVE CV LAB;  Service: Cardiovascular;  Laterality: N/A;  . CARDIAC CATHETERIZATION N/A 11/02/2015   Procedure: Right/Left Heart Cath and Coronary/Graft Angiography;  Surgeon: Laurey Morale, MD;  Location: [facility] INVASIVE CV LAB;  Service: Cardiovascular;  Laterality: N/A;  . CORONARY ARTERY BYPASS GRAFT N/A 10/27/2015   Procedure: CORONARY ARTERY BYPASS GRAFTING (CABG) x four, using left internal mammary artery and rt leg greater saphenous vein harvested endoscopically;  Surgeon: Kerin Perna, MD;  Location: [facility] OR;  Service: Open Heart Surgery;  Laterality: N/A;  . ENDARTERECTOMY Right 10/27/2015   Procedure: RIGHT ENDARTERECTOMY CAROTID with patch angioplasty using Xenosure bovine pericardium patch;  Surgeon: Fransisco Hertz, MD;  Location: [facility] OR;  Service: Vascular;  Laterality: Right;  . EYE MUSCLE SURGERY Left 1970s   "lazy eye"  . LACERATION REPAIR Right 2003   RUE; "fell thru plate-glass window"  . TEE WITHOUT CARDIOVERSION N/A 10/27/2015   Procedure: TRANSESOPHAGEAL ECHOCARDIOGRAM (TEE);  Surgeon: Kerin Perna, MD;  Location: [facility] OR;  Service: Open Heart Surgery;  Laterality: N/A;    Prior to Admission medications   Medication Sig Start Date End Date Taking? Authorizing Provider  apixaban (ELIQUIS) 5 MG TABS tablet Take 1 tablet (5 mg total) by mouth 2 (two) times daily. 02/06/17  Yes Laurey Morale, MD  carvedilol (COREG) 12.5 MG tablet Take 1 tablet (12.5 mg total) by mouth 2 (two) times daily with a meal. 02/20/17  Yes Clegg, Amy D, NP  DULoxetine (CYMBALTA) 30 MG  capsule Take 30 mg by mouth daily.   Yes [provider]  gabapentin (NEURONTIN) 600 MG tablet Take 600 mg by mouth 3 (three) times daily.    Yes [provider]  potassium chloride SA (K-DUR,KLOR-CON) 20 MEQ tablet Take 1 tablet (20 mEq total) by mouth daily. 01/25/17  Yes Laurey Morale, MD  QUEtiapine (SEROQUEL) 25 MG tablet Take 12.5-25 mg by mouth at bedtime.    Yes  [provider]  ranitidine (ZANTAC) 150 MG tablet Take 150 mg by mouth 2 (two) times daily.   Yes [provider]  sacubitril-valsartan (ENTRESTO) 24-26 MG Take 1 tablet by mouth 2 (two) times daily. 02/06/17  Yes Laurey Morale, MD  senna-docusate (SENOKOT-S) 8.6-50 MG tablet Take 1 tablet by mouth at bedtime. For constipation 12/01/15  Yes Love, Evlyn Kanner, PA-C  spironolactone (ALDACTONE) 25 MG tablet Take 1 tablet (25 mg total) by mouth daily. 12/06/16  Yes Laurey Morale, MD  torsemide (DEMADEX) 20 MG tablet Take 2 tabs every other day ALTERNATING with 3 tabs every other day Patient taking differently: Take 20 mg by mouth See admin instructions. Take 2 tabs every other day ALTERNATING with 3 tabs every other day 02/06/17  Yes Laurey Morale, MD  NEOMYCIN-POLYMYXIN-HYDROCORTISONE (CORTISPORIN) 1 % SOLN otic solution Apply 1-2 drops to toe BID after soaking Patient not taking: Reported on 03/17/2017 04/07/16   Hyatt, Max T, DPM  pantoprazole (PROTONIX) 40 MG tablet Take 1 tablet (40 mg total) by mouth daily. Patient not taking: Reported on 03/17/2017 12/30/15   Laurey Morale, MD  rosuvastatin (CRESTOR) 40 MG tablet TAKE 1 TABLET BY MOUTH ONCE DAILY Patient taking differently: TAKE 40 mg TABLET BY MOUTH ONCE DAILY 03/20/17   Bensimhon, Bevelyn Buckles, MD    Current Facility-Administered Medications  Medication Dose Route Frequency Provider Last Rate Last Dose  . 0.9 %  sodium chloride infusion   Intravenous Continuous Lorre Nick, MD 125 mL/hr at 03/23/17 1649    . acetaminophen (TYLENOL) tablet 650 mg  650 mg Oral Q6H PRN Rai, Ripudeep K, MD       Or  . acetaminophen (TYLENOL) suppository 650 mg  650 mg Rectal Q6H PRN Rai, Ripudeep K, MD      . famotidine (PEPCID) tablet 20 mg  20 mg Oral QHS Rai, Ripudeep K, MD   20 mg at 03/23/17 2139  . heparin injection 5,000 Units  5,000 Units Subcutaneous Q8H Rai, Ripudeep K, MD   5,000 Units at 03/23/17 2139  . HYDROcodone-acetaminophen  (NORCO/VICODIN) 5-325 MG per tablet 1-2 tablet  1-2 tablet Oral Q4H PRN Rai, Ripudeep K, MD      . ondansetron (ZOFRAN) tablet 4 mg  4 mg Oral Q6H PRN Rai, Ripudeep K, MD       Or  . ondansetron (ZOFRAN) injection 4 mg  4 mg Intravenous Q6H PRN Rai, Ripudeep K, MD      . senna-docusate (Senokot-S) tablet 1 tablet  1 tablet Oral QHS Rai, Ripudeep K, MD   1 tablet at 03/23/17 2139    Allergies as of 03/23/2017 - Review Complete 03/23/2017  Allergen Reaction Noted  . Losartan Shortness Of Breath 10/20/2015  . Elita Quick [ceftazidime] Rash 11/16/2015  . Lisinopril Rash 10/20/2015    Family History  Problem Relation Age of Onset  . Heart disease Mother   . Failure to thrive Father   . Congestive Heart Failure Father        Deceased  . Breast cancer  Sister        Living    Social History   Social History  . Marital status: Single    Spouse name: N/A  . Number of children: N/A  . Years of education: N/A   Occupational History  . Not on file.   Social History Main Topics  . Smoking status: Former Smoker    Packs/day: 1.00    Years: 23.00    Types: Cigarettes    Quit date: 2004  . Smokeless tobacco: Never Used  . Alcohol use No     Comment: Previously drank 24 beer/daily for 27 years, but has been sober since January 08, 2002  . Drug use: Yes    Types: Marijuana     Comment: 03/23/2017 "daily"  . Sexual activity: Yes   Other Topics Concern  . Not on file   Social History Narrative   Lives with girlfriend in an apartment on the second floor.     Works as a Teaching laboratory technician for apartment complex.     Has no children.  Education: 10th grade.    Review of Systems: Gen: Denies any fever, chills, sweats, anorexia, fatigue, weakness, malaise, weight loss, and sleep disorder HEENT: No visual complaints, No history of Retinopathy. Normal external appearance No Epistaxis or Sore throat. No sinusitis.   CV: Denies chest pain, angina, palpitations, syncope, orthopnea, PND,  peripheral edema, and claudication. Resp: Denies dyspnea at rest, dyspnea with exercise, cough, sputum, wheezing, coughing up blood, and pleurisy. GI: Denies vomiting blood, jaundice, and fecal incontinence.   Denies dysphagia or odynophagia. GU : Denies urinary burning, blood in urine, urinary frequency, urinary hesitancy, nocturnal urination, and urinary incontinence.  No renal calculi. MS: Denies joint pain, limitation of movement, and swelling, stiffness, low back pain, extremity pain. Denies muscle weakness, cramps, atrophy.  No use of non steroidal antiinflammatory drugs. Derm: Denies rash, itching, dry skin, hives, moles, warts, or unhealing ulcers.  Psych: Denies depression, anxiety, memory loss, suicidal ideation, hallucinations, paranoia, and confusion. Heme: Denies bruising, bleeding, and enlarged lymph nodes. Neuro: No headache.  No diplopia. No dysarthria.  No dysphasia.  No history of CVA.  No Seizures. No paresthesias.  No weakness. Endocrine No DM.  No Thyroid disease.  No Adrenal disease.  Physical Exam: Vital signs in last 24 hours: Temp:  [97.5 F (36.4 C)-97.9 F (36.6 C)] 97.9 F (36.6 C) (08/17 0453) Pulse Rate:  [52-57] 55 (08/17 0453) Resp:  [15-18] 15 (08/17 0453) BP: (100-120)/(56-73) 100/61 (08/17 0453) SpO2:  [96 %-99 %] 98 % (08/17 0453) Weight:  [276 lb 2 oz (125.2 kg)-276 lb 8 oz (125.4 kg)] 276 lb 8 oz (125.4 kg) (08/16 2015) Last BM Date: 03/23/17 General:   Alert,  Well-developed, well-nourished, pleasant and cooperative in NAD  Obese  Head:  Normocephalic and atraumatic. Eyes:  Sclera clear, no icterus.   Conjunctiva pink. Ears:  Normal auditory acuity. Nose:  No deformity, discharge,  or lesions. Mouth:  No deformity or lesions, dentition normal. Neck:  Supple; no masses or thyromegaly. JVP not elevated Lungs:  Clear throughout to auscultation.   No wheezes, crackles, or rhonchi. No acute distress. Heart:  Regular rate and rhythm; no murmurs, clicks,  rubs,  or gallops. Abdomen:  Soft, nontender and nondistended. No masses, hepatosplenomegaly or hernias noted. Normal bowel sounds, without guarding, and without rebound.   Msk:  Symmetrical without gross deformities. Normal posture. Pulses:  No carotid, renal, femoral bruits. DP and PT symmetrical and equal Extremities:  Without clubbing or edema. Neurologic:  Alert and  oriented x4;  grossly normal neurologically. Skin:  Intact without significant lesions or rashes. Cervical Nodes:  No significant cervical adenopathy. Psych:  Alert and cooperative. Normal mood and affect.  Intake/Output from previous day: 08/16 0701 - 08/17 0700 In: 1707.9 [P.O.:60; I.V.:1647.9] Out: 150 [Urine:150] Intake/Output this shift: No intake/output data recorded.  Lab Results:  Recent Labs  03/23/17 1405 03/24/17 0339  WBC 6.8 6.1  HGB 12.4* 12.3*  HCT 36.8* 37.0*  PLT 154 143*   BMET  Recent Labs  03/23/17 1405 03/24/17 0339  NA 136 137  K 4.3 4.0  CL 104 106  CO2 21* 22  GLUCOSE 145* 93  BUN 69* 61*  CREATININE 5.05* 3.65*  CALCIUM 9.2 9.0   LFT  Recent Labs  03/23/17 1405  PROT 6.9  ALBUMIN 4.2  AST 21  ALT 24  ALKPHOS 79  BILITOT 1.0   PT/INR No results for input(s): LABPROT, INR in the last 72 hours. Hepatitis Panel No results for input(s): HEPBSAG, HCVAB, HEPAIGM, HEPBIGM in the last 72 hours.  Studies/Results: US Renal  Result Date: 03/23/2017 CLINICAL DATA:  Acute kidney injury chronic kidney disease stage 3 EXAM: RENAL / URINARY TRACT ULTRASOUND COMPLETE COMPARISON:  None. FINDINGS: Right Kidney: Length: 9 cm. Echogenicity within normal limits. No mass or hydronephrosis visualized. Left Kidney: Length: 9.6 cm. Echogenicity within normal limits. No mass or hydronephrosis visualized. Bladder: Appears normal for degree of bladder distention. IMPRESSION: Negative renal ultrasound Electronically Signed   By: Jasmine Pang M.D.   On: 03/23/2017 23:17     Assessment/Plan:  1. Acute on chronic renal disease with a rise in creatinine from a baseline of 1.8 to now over 5mg /dl   He is taking valsartan and demedex and spironolactone   ( the valsartan was started recently )  Along with eliquis. This all appears hemodynamically mediated and agree with stopping heart failure medications at this point. It is a great idea for Dr Jearld Pies to see the patient to assist with this.  2. Anemia  No ESA requirements  3. Serologies pending including HIV and SPEP UPEP   4. Statins  - continue to monitor CPK and LFT  5. Will sign off as patient is to be discharged and will follow up on labs next week    LOS: 1 Aniaya Bacha W     [phone]  @TODAY @7 :33 AM

## 2017-03-24 NOTE — Consult Note (Signed)
Advanced Heart Failure Team Consult Note   Primary Physician: Dr. Shirlean Mylar Primary Cardiologist:  Dr. Shirlee Latch   Reason for Consultation: AKI on CKD, systolic CHF   HPI:    Christopher [name] is seen today for evaluation of acute kidney injury with underlying CKD at the request of Dr. Isidoro Donning.   Christopher [name] is a 56 year old male with a past medical history of CAD status post CABG, ischemic cardiomyopathy, CAD, and chronic systolic CHF (Echo 02/2017 EF 40-45%).  In March 2017 he was admitted with a late presentation anterior MI. He was noted to have occluded RCA with collaterals and a 95% proximal LAD. He underwent CABG 4 and right CEA. Hospitalization was complicated by cardiogenic shock with IABP, and multiple episodes of postoperative VT/VF. Repeat catheterization showed slow flow in the SVG to diagonal and SVG to ramus. He had prolonged mechanical ventilation and eventually tracheostomy. He had transient atrial flutter for which resolved. He went to inpatient rehabilitation, tracheostomy was able to be removed. Follow-up echo in June 2017 with EF 40-45%.  He was seen in the CHF clinic in July 2018, noted to be in atrial flutter, rate was controlled. He was started on Eliquis, and DCCV was planned for  month later. He was also started on Entresto and his torsemide was increased for volume overload. He was seen by Dr. Graciela Husbands for consideration of atrial flutter ablation who felt that ablation was not indicated.   He was seen in the ED on 03/17/2017 with fatigue and cough. Diagnosed with viral URI and it was felt that his symptoms were related to his abrupt discontinuation of his gabapentin. He says he was told by Dr. Marland Mcalpine office to stop taking gabapentin.  He re-presented to the ED on 03/23/2017 after his PCP called to tell him that his creatinine was 5.0. He has felt at his baseline since admission. Denies SOB, palpitations, chest pain.    Review of Systems: [y] = yes, [ ]  = no   General:  Weight gain [ ] ; Weight loss [ ] ; Anorexia [ ] ; Fatigue [ ] ; Fever [ ] ; Chills [ ] ; Weakness [ ]   Cardiac: Chest pain/pressure [ ] ; Resting SOB [ ] ; Exertional SOB [ ] ; Orthopnea [ ] ; Pedal Edema [ ] ; Palpitations [ ] ; Syncope [ ] ; Presyncope [ ] ; Paroxysmal nocturnal dyspnea[ ]   Pulmonary: Cough [ ] ; Wheezing[ ] ; Hemoptysis[ ] ; Sputum [ ] ; Snoring [ ]   GI: Vomiting[ ] ; Dysphagia[ ] ; Melena[ ] ; Hematochezia [ ] ; Heartburn[ ] ; Abdominal pain [ ] ; Constipation [ ] ; Diarrhea [ ] ; BRBPR [ ]   GU: Hematuria[ ] ; Dysuria [ ] ; Nocturia[ ]   Vascular: Pain in legs with walking [ ] ; Pain in feet with lying flat [ ] ; Non-healing sores [ ] ; Stroke [ ] ; TIA [ ] ; Slurred speech [ ] ;  Neuro: Headaches[ ] ; Vertigo[ ] ; Seizures[ ] ; Paresthesias[ ] ;Blurred vision [ ] ; Diplopia [ ] ; Vision changes [ ]   Ortho/Skin: [place] ]; Joint pain [ ] ; Muscle pain [ ] ; Joint swelling [ ] ; Back Pain [ ] ; Rash [ ]   Psych: Depression[ ] ; Anxiety[ ]   Heme: Bleeding problems [ ] ; Clotting disorders [ ] ; Anemia [ ]   Endocrine: Diabetes [ ] ; Thyroid dysfunction[ ]   Home Medications Prior to Admission medications   Medication Sig Start Date End Date Taking? Authorizing Provider  apixaban (ELIQUIS) 5 MG TABS tablet Take 1 tablet (5 mg total) by mouth 2 (two) times daily. 02/06/17  Yes Laurey Morale,  MD  carvedilol (COREG) 12.5 MG tablet Take 1 tablet (12.5 mg total) by mouth 2 (two) times daily with a meal. 02/20/17  Yes Clegg, Amy D, NP  DULoxetine (CYMBALTA) 30 MG capsule Take 30 mg by mouth daily.   Yes [provider]  gabapentin (NEURONTIN) 600 MG tablet Take 600 mg by mouth 3 (three) times daily.    Yes [provider]  potassium chloride SA (K-DUR,KLOR-CON) 20 MEQ tablet Take 1 tablet (20 mEq total) by mouth daily. 01/25/17  Yes Laurey Morale, MD  QUEtiapine (SEROQUEL) 25 MG tablet Take 12.5-25 mg by mouth at bedtime.    Yes [provider]  ranitidine (ZANTAC) 150 MG tablet Take 150 mg by mouth  2 (two) times daily.   Yes [provider]  sacubitril-valsartan (ENTRESTO) 24-26 MG Take 1 tablet by mouth 2 (two) times daily. 02/06/17  Yes Laurey Morale, MD  senna-docusate (SENOKOT-S) 8.6-50 MG tablet Take 1 tablet by mouth at bedtime. For constipation 12/01/15  Yes Love, Evlyn Kanner, PA-C  spironolactone (ALDACTONE) 25 MG tablet Take 1 tablet (25 mg total) by mouth daily. 12/06/16  Yes Laurey Morale, MD  torsemide (DEMADEX) 20 MG tablet Take 2 tabs every other day ALTERNATING with 3 tabs every other day Patient taking differently: Take 20 mg by mouth See admin instructions. Take 2 tabs every other day ALTERNATING with 3 tabs every other day 02/06/17  Yes Laurey Morale, MD  NEOMYCIN-POLYMYXIN-HYDROCORTISONE (CORTISPORIN) 1 % SOLN otic solution Apply 1-2 drops to toe BID after soaking Patient not taking: Reported on 03/17/2017 04/07/16   Hyatt, Max T, DPM  pantoprazole (PROTONIX) 40 MG tablet Take 1 tablet (40 mg total) by mouth daily. Patient not taking: Reported on 03/17/2017 12/30/15   Laurey Morale, MD  rosuvastatin (CRESTOR) 40 MG tablet TAKE 1 TABLET BY MOUTH ONCE DAILY Patient taking differently: TAKE 40 mg TABLET BY MOUTH ONCE DAILY 03/20/17   Bensimhon, Bevelyn Buckles, MD    Past Medical History: Past Medical History:  Diagnosis Date  . Anxiety   . CHF (congestive heart failure) (HCC)    Hattie Perch 03/23/2017  . CKD (chronic kidney disease), stage III    Hattie Perch 03/23/2017  . Complete heart block (HCC) 10/20/2015  . COPD (chronic obstructive pulmonary disease) (HCC)    pt denies this hx on 03/23/2017  . Coronary artery disease   . High cholesterol   . Hypertension   . Myocardial infarction [facility]) 10/2015   "massive one; saw scar tissue of possible previous MI maybe in 2013" (03/23/2017)  . Stenosis of right carotid artery   . TIA (transient ischemic attack) 2016    Past Surgical History: Past Surgical History:  Procedure Laterality Date  . CARDIAC CATHETERIZATION N/A 10/20/2015     Procedure: Left Heart Cath and Coronary Angiography;  Surgeon: Tonny Bollman, MD;  Location: [facility] INVASIVE CV LAB;  Service: Cardiovascular;  Laterality: N/A;  . CARDIAC CATHETERIZATION N/A 11/02/2015   Procedure: Right/Left Heart Cath and Coronary/Graft Angiography;  Surgeon: Laurey Morale, MD;  Location: [facility] INVASIVE CV LAB;  Service: Cardiovascular;  Laterality: N/A;  . CORONARY ARTERY BYPASS GRAFT N/A 10/27/2015   Procedure: CORONARY ARTERY BYPASS GRAFTING (CABG) x four, using left internal mammary artery and rt leg greater saphenous vein harvested endoscopically;  Surgeon: Kerin Perna, MD;  Location: [facility] OR;  Service: Open Heart Surgery;  Laterality: N/A;  . ENDARTERECTOMY Right 10/27/2015   Procedure: RIGHT ENDARTERECTOMY CAROTID with patch angioplasty using Xenosure bovine  pericardium patch;  Surgeon: Fransisco Hertz, MD;  Location: [facility] OR;  Service: Vascular;  Laterality: Right;  . EYE MUSCLE SURGERY Left 1970s   "lazy eye"  . LACERATION REPAIR Right 2003   RUE; "fell thru plate-glass window"  . TEE WITHOUT CARDIOVERSION N/A 10/27/2015   Procedure: TRANSESOPHAGEAL ECHOCARDIOGRAM (TEE);  Surgeon: Kerin Perna, MD;  Location: [facility] OR;  Service: Open Heart Surgery;  Laterality: N/A;    Family History: Family History  Problem Relation Age of Onset  . Heart disease Mother   . Failure to thrive Father   . Congestive Heart Failure Father        Deceased  . Breast cancer Sister        Living    Social History: Social History   Social History  . Marital status: Single    Spouse name: N/A  . Number of children: N/A  . Years of education: N/A   Social History Main Topics  . Smoking status: Former Smoker    Packs/day: 1.00    Years: 23.00    Types: Cigarettes    Quit date: 2004  . Smokeless tobacco: Never Used  . Alcohol use No     Comment: Previously drank 24 beer/daily for 27 years, but has been sober since January 08, 2002  . Drug use: Yes    Types: Marijuana     Comment:  03/23/2017 "daily"  . Sexual activity: Yes   Other Topics Concern  . None   Social History Narrative   Lives with girlfriend in an apartment on the second floor.     Works as a Teaching laboratory technician for apartment complex.     Has no children.  Education: 10th grade.    Allergies:  Allergies  Allergen Reactions  . Losartan Shortness Of Breath  . Elita Quick [Ceftazidime] Rash    Rash with blisters  . Lisinopril Rash    Objective:    Vital Signs:   Temp:  [97.5 F (36.4 C)-97.9 F (36.6 C)] 97.9 F (36.6 C) (08/17 0453) Pulse Rate:  [52-57] 55 (08/17 0453) Resp:  [15-18] 15 (08/17 0453) BP: (100-120)/(56-73) 100/61 (08/17 0453) SpO2:  [96 %-99 %] 98 % (08/17 0453) Weight:  [276 lb 2 oz (125.2 kg)-276 lb 8 oz (125.4 kg)] 276 lb 8 oz (125.4 kg) (08/16 2015) Last BM Date: 03/23/17  Weight change: Filed Weights   03/23/17 1338 03/23/17 2015  Weight: 276 lb 2 oz (125.2 kg) 276 lb 8 oz (125.4 kg)    Intake/Output:   Intake/Output Summary (Last 24 hours) at 03/24/17 0906 Last data filed at 03/24/17 0600  Gross per 24 hour  Intake          1707.92 ml  Output              150 ml  Net          1557.92 ml      Physical Exam    General:  Well appearing. No resp difficulty HEENT: normal Neck: supple. JVP . Carotids 2+ bilat; no bruits. No lymphadenopathy or thyromegaly appreciated. Cor: PMI nondisplaced. Regular rate & rhythm. No rubs, gallops or murmurs. Lungs: clear Abdomen: soft, nontender, nondistended. No hepatosplenomegaly. No bruits or masses. Good bowel sounds. Extremities: no cyanosis, clubbing, rash, edema Neuro: alert & orientedx3, cranial nerves grossly intact. moves all 4 extremities w/o difficulty. Affect pleasant   Telemetry   Atrial flutter - personally reviewed.   EKG    4:1 atrial flutter - personally  reviewed.   Labs   Basic Metabolic Panel:  Recent Labs Lab 03/17/17 1209 03/23/17 1405 03/24/17 0339  NA 136 136 137  K 5.0 4.3 4.0  CL  102 104 106  CO2 23 21* 22  GLUCOSE 131* 145* 93  BUN 39* 69* 61*  CREATININE 2.47* 5.05* 3.65*  CALCIUM 10.1 9.2 9.0    Liver Function Tests:  Recent Labs Lab 03/23/17 1405  AST 21  ALT 24  ALKPHOS 79  BILITOT 1.0  PROT 6.9  ALBUMIN 4.2   No results for input(s): LIPASE, AMYLASE in the last 168 hours. No results for input(s): AMMONIA in the last 168 hours.  CBC:  Recent Labs Lab 03/17/17 1209 03/23/17 1405 03/24/17 0339  WBC 9.1 6.8 6.1  HGB 14.8 12.4* 12.3*  HCT 44.2 36.8* 37.0*  MCV 94.8 93.9 94.9  PLT 181 154 143*    Cardiac Enzymes:  Recent Labs Lab 03/24/17 0743  CKTOTAL 324    BNP: BNP (last 3 results)  Recent Labs  08/16/16 1123 12/06/16 1429 02/06/17 1136  BNP 274.1* 309.3* 330.6*    ProBNP (last 3 results) No results for input(s): PROBNP in the last 8760 hours.   CBG: No results for input(s): GLUCAP in the last 168 hours.  Coagulation Studies: No results for input(s): LABPROT, INR in the last 72 hours.   Imaging   US Renal  Result Date: 03/23/2017 CLINICAL DATA:  Acute kidney injury chronic kidney disease stage 3 EXAM: RENAL / URINARY TRACT ULTRASOUND COMPLETE COMPARISON:  None. FINDINGS: Right Kidney: Length: 9 cm. Echogenicity within normal limits. No mass or hydronephrosis visualized. Left Kidney: Length: 9.6 cm. Echogenicity within normal limits. No mass or hydronephrosis visualized. Bladder: Appears normal for degree of bladder distention. IMPRESSION: Negative renal ultrasound Electronically Signed   By: Jasmine Pang M.D.   On: 03/23/2017 23:17      Medications:     Current Medications: . apixaban  5 mg Oral BID  . famotidine  20 mg Oral QHS  . senna-docusate  1 tablet Oral QHS     Infusions: . sodium chloride 125 mL/hr at 03/23/17 1649       Patient Profile   Christopher [name] is a 56 year old male with a past medical history of CAD status post CABG, ischemic cardiomyopathy, CAD, and chronic systolic CHF (Echo  02/2017 EF 16-10%).  Assessment/Plan   1. AKI on CKD: baseline creatinine 1.7-2.0.  - felt to be related to medications, he is on a high dose of gabapentin but has been taking this for a few years. He was started on Entresto on 02/06/17 when creatinine was 1.78. Creatinine was 2.47 on 8/10. Then rose to 5.05 on 03/23/17. Will hold Sherryll Burger and Spiro at discharge. Will do BMET and follow up with the CHF clinic next week.   2. Chronic systolic CHF: ICM, s/p CABG 10/2015. EF improved at 40-45%. BNP on admission was 330  - Hold Entresto and [place] for now.  - Hold torsemide, restart Sunday at 20 mg daily.  - will hold beta blocker as well. BP is soft.   3. Atrial flutter - Continue Eliquis for anticoagulation. Will consult HF pharmD about dosing with AKI.   4. CAD s/p CABG - Notes his anginal equivalent is diaphoresis and fatigue. Denies these symptoms - No ASA with Eliquis - Continue Crestor.      Length of Stay: 1  Little Ishikawa, NP  03/24/2017, 9:06 AM  Advanced Heart Failure Team Pager [phone] (  M-F; 7a - 4p)  Please contact [facility] for night-coverage after hours (4p -7a ) and weekends on [email or web address]  Agree with above. D/w Dr. Shirlee Latch. Will hold Entresto and spiro. Restart torsemide (20mg  daily) and carvedilol (3.125 bid) on Sunday. F/u with CHF Clinic on Tuesday with labs.   Arvilla Meres, MD  2:48 PM

## 2017-03-24 NOTE — Telephone Encounter (Signed)
Patient's wife called to clarify medication changes from discharge.  Medications in question were carvedilol and torsemide.  I verified with Suzzette Righter, NP that on Sunday if patient's BP is stable around 120/80 range he needs to start taking carvedilol 3.125 mg (1 tablet) Twice Daily and Torsemide 20 mg (1 Tablet) Daily.  Patient's wife understands and asked for new scripts to be sent to [facility] on [address].  New prescriptions sent to pharmacy, no further questions.

## 2017-03-24 NOTE — Progress Notes (Signed)
ANTICOAGULATION CONSULT NOTE - Initial Consult  Pharmacy Consult for Eliquis Indication: atrial fibrillation  Allergies  Allergen Reactions  . Losartan Shortness Of Breath  . Elita Quick [Ceftazidime] Rash    Rash with blisters  . Lisinopril Rash    Patient Measurements: Height: 5' 10.5" (179.1 cm) Weight: 276 lb 8 oz (125.4 kg) IBW/kg (Calculated) : 74.15  Vital Signs: Temp: 97.9 F (36.6 C) (08/17 0453) Temp Source: Oral (08/17 0453) BP: 100/61 (08/17 0453) Pulse Rate: 55 (08/17 0453)  Labs:  Recent Labs  03/23/17 1405 03/24/17 0339 03/24/17 0743  HGB 12.4* 12.3*  --   HCT 36.8* 37.0*  --   PLT 154 143*  --   CREATININE 5.05* 3.65*  --   CKTOTAL  --   --  324    Estimated Creatinine Clearance: 30.6 mL/min (A) (by C-G formula based on SCr of 3.65 mg/dL (H)).  Assessment: 56 YOM admitted with AKI, on Eliquis PTA for AFib. Patient with SCr 3.65 this morning, down from 5.05. Patient does NOT meet qualifications for dose reduction as he is <57 years old and weighs more than 60kg. hgb 123, plts 143- no overt bleeding noted.  Goal of Therapy:    Monitor platelets by anticoagulation protocol: Yes   Plan:  -Eliquis 5mg  PO BID- discussed with Dr. Isidoro Donning -Follow for s/s bleeding  Glenyce Randle D. Dana Dorner, PharmD, BCPS Clinical Pharmacist Pager: [phone] Clinical Phone for 03/24/2017 until 3:30pm: [phone] If after 3:30pm, please call main pharmacy at [phone] 03/24/2017 10:14 AM

## 2017-03-28 ENCOUNTER — Inpatient Hospital Stay (HOSPITAL_COMMUNITY): Payer: Medicaid Other

## 2017-03-28 ENCOUNTER — Encounter (HOSPITAL_COMMUNITY): Payer: Self-pay | Admitting: Cardiology

## 2017-03-28 ENCOUNTER — Inpatient Hospital Stay (HOSPITAL_COMMUNITY)
Admission: EM | Admit: 2017-03-28 | Discharge: 2017-04-03 | DRG: 871 | Disposition: A | Discharge status: Home / Self Care | Payer: Medicaid Other | Attending: Internal Medicine | Admitting: Internal Medicine

## 2017-03-28 ENCOUNTER — Emergency Department (HOSPITAL_COMMUNITY): Payer: Medicaid Other

## 2017-03-28 ENCOUNTER — Encounter (HOSPITAL_COMMUNITY): Payer: Self-pay | Admitting: *Deleted

## 2017-03-28 ENCOUNTER — Other Ambulatory Visit: Payer: Self-pay

## 2017-03-28 ENCOUNTER — Ambulatory Visit (HOSPITAL_COMMUNITY)
Admit: 2017-03-28 | Discharge: 2017-03-28 | Disposition: A | Discharge status: Home / Self Care | Payer: Medicaid Other | Attending: Cardiology | Admitting: Cardiology

## 2017-03-28 VITALS — BP 181/122 | HR 82 | Wt 276.2 lb

## 2017-03-28 DIAGNOSIS — I10 Essential (primary) hypertension: Secondary | ICD-10-CM | POA: Diagnosis present

## 2017-03-28 DIAGNOSIS — I161 Hypertensive emergency: Secondary | ICD-10-CM

## 2017-03-28 DIAGNOSIS — Z883 Allergy status to other anti-infective agents status: Secondary | ICD-10-CM

## 2017-03-28 DIAGNOSIS — I5043 Acute on chronic combined systolic (congestive) and diastolic (congestive) heart failure: Secondary | ICD-10-CM | POA: Diagnosis present

## 2017-03-28 DIAGNOSIS — I2510 Atherosclerotic heart disease of native coronary artery without angina pectoris: Secondary | ICD-10-CM | POA: Diagnosis present

## 2017-03-28 DIAGNOSIS — A419 Sepsis, unspecified organism: Secondary | ICD-10-CM | POA: Diagnosis not present

## 2017-03-28 DIAGNOSIS — D696 Thrombocytopenia, unspecified: Secondary | ICD-10-CM | POA: Diagnosis not present

## 2017-03-28 DIAGNOSIS — E7800 Pure hypercholesterolemia, unspecified: Secondary | ICD-10-CM | POA: Diagnosis present

## 2017-03-28 DIAGNOSIS — Z7901 Long term (current) use of anticoagulants: Secondary | ICD-10-CM

## 2017-03-28 DIAGNOSIS — E872 Acidosis: Secondary | ICD-10-CM | POA: Diagnosis present

## 2017-03-28 DIAGNOSIS — I4891 Unspecified atrial fibrillation: Secondary | ICD-10-CM | POA: Diagnosis present

## 2017-03-28 DIAGNOSIS — N184 Chronic kidney disease, stage 4 (severe): Secondary | ICD-10-CM

## 2017-03-28 DIAGNOSIS — J440 Chronic obstructive pulmonary disease with acute lower respiratory infection: Secondary | ICD-10-CM | POA: Diagnosis present

## 2017-03-28 DIAGNOSIS — Y95 Nosocomial condition: Secondary | ICD-10-CM | POA: Diagnosis present

## 2017-03-28 DIAGNOSIS — I252 Old myocardial infarction: Secondary | ICD-10-CM | POA: Diagnosis not present

## 2017-03-28 DIAGNOSIS — J189 Pneumonia, unspecified organism: Secondary | ICD-10-CM | POA: Diagnosis not present

## 2017-03-28 DIAGNOSIS — I255 Ischemic cardiomyopathy: Secondary | ICD-10-CM | POA: Diagnosis present

## 2017-03-28 DIAGNOSIS — J9601 Acute respiratory failure with hypoxia: Secondary | ICD-10-CM

## 2017-03-28 DIAGNOSIS — Z87891 Personal history of nicotine dependence: Secondary | ICD-10-CM | POA: Diagnosis not present

## 2017-03-28 DIAGNOSIS — N183 Chronic kidney disease, stage 3 unspecified: Secondary | ICD-10-CM

## 2017-03-28 DIAGNOSIS — I130 Hypertensive heart and chronic kidney disease with heart failure and stage 1 through stage 4 chronic kidney disease, or unspecified chronic kidney disease: Secondary | ICD-10-CM | POA: Diagnosis present

## 2017-03-28 DIAGNOSIS — G629 Polyneuropathy, unspecified: Secondary | ICD-10-CM | POA: Diagnosis present

## 2017-03-28 DIAGNOSIS — I483 Typical atrial flutter: Secondary | ICD-10-CM | POA: Diagnosis not present

## 2017-03-28 DIAGNOSIS — I5022 Chronic systolic (congestive) heart failure: Secondary | ICD-10-CM | POA: Diagnosis not present

## 2017-03-28 DIAGNOSIS — N179 Acute kidney failure, unspecified: Secondary | ICD-10-CM | POA: Diagnosis present

## 2017-03-28 DIAGNOSIS — I248 Other forms of acute ischemic heart disease: Secondary | ICD-10-CM | POA: Diagnosis present

## 2017-03-28 DIAGNOSIS — F419 Anxiety disorder, unspecified: Secondary | ICD-10-CM | POA: Diagnosis present

## 2017-03-28 DIAGNOSIS — Z6841 Body Mass Index (BMI) 40.0 and over, adult: Secondary | ICD-10-CM

## 2017-03-28 DIAGNOSIS — I739 Peripheral vascular disease, unspecified: Secondary | ICD-10-CM | POA: Diagnosis present

## 2017-03-28 DIAGNOSIS — J431 Panlobular emphysema: Secondary | ICD-10-CM | POA: Diagnosis not present

## 2017-03-28 DIAGNOSIS — Z8673 Personal history of transient ischemic attack (TIA), and cerebral infarction without residual deficits: Secondary | ICD-10-CM

## 2017-03-28 DIAGNOSIS — I4892 Unspecified atrial flutter: Secondary | ICD-10-CM | POA: Diagnosis not present

## 2017-03-28 DIAGNOSIS — I484 Atypical atrial flutter: Secondary | ICD-10-CM | POA: Diagnosis present

## 2017-03-28 DIAGNOSIS — I5023 Acute on chronic systolic (congestive) heart failure: Secondary | ICD-10-CM | POA: Diagnosis not present

## 2017-03-28 DIAGNOSIS — Z79899 Other long term (current) drug therapy: Secondary | ICD-10-CM

## 2017-03-28 DIAGNOSIS — Z951 Presence of aortocoronary bypass graft: Secondary | ICD-10-CM

## 2017-03-28 DIAGNOSIS — J810 Acute pulmonary edema: Secondary | ICD-10-CM

## 2017-03-28 DIAGNOSIS — E876 Hypokalemia: Secondary | ICD-10-CM | POA: Diagnosis not present

## 2017-03-28 DIAGNOSIS — R791 Abnormal coagulation profile: Secondary | ICD-10-CM | POA: Diagnosis present

## 2017-03-28 DIAGNOSIS — R918 Other nonspecific abnormal finding of lung field: Secondary | ICD-10-CM

## 2017-03-28 DIAGNOSIS — E875 Hyperkalemia: Secondary | ICD-10-CM | POA: Diagnosis not present

## 2017-03-28 DIAGNOSIS — I442 Atrioventricular block, complete: Secondary | ICD-10-CM | POA: Diagnosis present

## 2017-03-28 DIAGNOSIS — R0600 Dyspnea, unspecified: Secondary | ICD-10-CM | POA: Diagnosis present

## 2017-03-28 DIAGNOSIS — E669 Obesity, unspecified: Secondary | ICD-10-CM | POA: Diagnosis present

## 2017-03-28 DIAGNOSIS — J441 Chronic obstructive pulmonary disease with (acute) exacerbation: Secondary | ICD-10-CM | POA: Diagnosis not present

## 2017-03-28 DIAGNOSIS — R739 Hyperglycemia, unspecified: Secondary | ICD-10-CM | POA: Diagnosis present

## 2017-03-28 DIAGNOSIS — I480 Paroxysmal atrial fibrillation: Secondary | ICD-10-CM | POA: Diagnosis not present

## 2017-03-28 LAB — URINALYSIS, ROUTINE W REFLEX MICROSCOPIC
Bilirubin Urine: NEGATIVE
GLUCOSE, UA: NEGATIVE mg/dL
KETONES UR: NEGATIVE mg/dL
Leukocytes, UA: NEGATIVE
NITRITE: NEGATIVE
PROTEIN: 100 mg/dL — AB
SQUAMOUS EPITHELIAL / LPF: NONE SEEN
Specific Gravity, Urine: 1.011 (ref 1.005–1.030)
pH: 5 (ref 5.0–8.0)

## 2017-03-28 LAB — CBC WITH DIFFERENTIAL/PLATELET
BASOS PCT: 1 %
Basophils Absolute: 0.2 10*3/uL — ABNORMAL HIGH (ref 0.0–0.1)
EOS ABS: 0.3 10*3/uL (ref 0.0–0.7)
Eosinophils Relative: 2 %
HCT: 47.3 % (ref 39.0–52.0)
Hemoglobin: 16.2 g/dL (ref 13.0–17.0)
LYMPHS ABS: 3.5 10*3/uL (ref 0.7–4.0)
Lymphocytes Relative: 23 %
MCH: 32.8 pg (ref 26.0–34.0)
MCHC: 34.2 g/dL (ref 30.0–36.0)
MCV: 95.7 fL (ref 78.0–100.0)
Monocytes Absolute: 1.4 10*3/uL — ABNORMAL HIGH (ref 0.1–1.0)
Monocytes Relative: 9 %
NEUTROS PCT: 65 %
Neutro Abs: 9.9 10*3/uL — ABNORMAL HIGH (ref 1.7–7.7)
PLATELETS: 223 10*3/uL (ref 150–400)
RBC: 4.94 MIL/uL (ref 4.22–5.81)
RDW: 16.2 % — AB (ref 11.5–15.5)
WBC: 15.3 10*3/uL — AB (ref 4.0–10.5)

## 2017-03-28 LAB — BASIC METABOLIC PANEL
ANION GAP: 14 (ref 5–15)
BUN: 28 mg/dL — ABNORMAL HIGH (ref 6–20)
CALCIUM: 9.1 mg/dL (ref 8.9–10.3)
CO2: 18 mmol/L — AB (ref 22–32)
Chloride: 107 mmol/L (ref 101–111)
Creatinine, Ser: 3.38 mg/dL — ABNORMAL HIGH (ref 0.61–1.24)
GFR, EST AFRICAN AMERICAN: 22 mL/min — AB (ref >60)
GFR, EST NON AFRICAN AMERICAN: 19 mL/min — AB (ref >60)
GLUCOSE: 124 mg/dL — AB (ref 65–99)
POTASSIUM: 5 mmol/L (ref 3.5–5.1)
Sodium: 139 mmol/L (ref 135–145)

## 2017-03-28 LAB — APTT: APTT: 33 s (ref 24–36)

## 2017-03-28 LAB — TYPE AND SCREEN
ABO/RH(D): A POS
Antibody Screen: NEGATIVE

## 2017-03-28 LAB — GLUCOSE, CAPILLARY
GLUCOSE-CAPILLARY: 127 mg/dL — AB (ref 65–99)
GLUCOSE-CAPILLARY: 149 mg/dL — AB (ref 65–99)
Glucose-Capillary: 110 mg/dL — ABNORMAL HIGH (ref 65–99)

## 2017-03-28 LAB — COMPREHENSIVE METABOLIC PANEL
ALBUMIN: 4.3 g/dL (ref 3.5–5.0)
ALT: 23 U/L (ref 17–63)
AST: 33 U/L (ref 15–41)
Alkaline Phosphatase: 105 U/L (ref 38–126)
Anion gap: 17 — ABNORMAL HIGH (ref 5–15)
BUN: 24 mg/dL — AB (ref 6–20)
CHLORIDE: 106 mmol/L (ref 101–111)
CO2: 16 mmol/L — AB (ref 22–32)
CREATININE: 2.86 mg/dL — AB (ref 0.61–1.24)
Calcium: 9.3 mg/dL (ref 8.9–10.3)
GFR calc Af Amer: 27 mL/min — ABNORMAL LOW (ref >60)
GFR calc non Af Amer: 23 mL/min — ABNORMAL LOW (ref >60)
GLUCOSE: 296 mg/dL — AB (ref 65–99)
POTASSIUM: 5.3 mmol/L — AB (ref 3.5–5.1)
SODIUM: 139 mmol/L (ref 135–145)
Total Bilirubin: 1.2 mg/dL (ref 0.3–1.2)
Total Protein: 7.8 g/dL (ref 6.5–8.1)

## 2017-03-28 LAB — TROPONIN I
TROPONIN I: 0.04 ng/mL — AB (ref <0.03)
TROPONIN I: 0.38 ng/mL — AB (ref <0.03)

## 2017-03-28 LAB — MRSA PCR SCREENING: MRSA by PCR: NEGATIVE

## 2017-03-28 LAB — HEMOGLOBIN A1C
Hgb A1c MFr Bld: 7.4 % — ABNORMAL HIGH (ref 4.8–5.6)
MEAN PLASMA GLUCOSE: 165.68 mg/dL

## 2017-03-28 LAB — BRAIN NATRIURETIC PEPTIDE: B Natriuretic Peptide: 1836.9 pg/mL — ABNORMAL HIGH (ref 0.0–100.0)

## 2017-03-28 LAB — LACTIC ACID, PLASMA
LACTIC ACID, VENOUS: 2.7 mmol/L — AB (ref 0.5–1.9)
Lactic Acid, Venous: 2.2 mmol/L (ref 0.5–1.9)

## 2017-03-28 LAB — PROCALCITONIN: PROCALCITONIN: 1.98 ng/mL

## 2017-03-28 LAB — STREP PNEUMONIAE URINARY ANTIGEN: STREP PNEUMO URINARY ANTIGEN: NEGATIVE

## 2017-03-28 LAB — HEPARIN LEVEL (UNFRACTIONATED): Heparin Unfractionated: >2.2 IU/mL — ABNORMAL HIGH (ref 0.30–0.70)

## 2017-03-28 MED ORDER — HYDRALAZINE HCL 50 MG PO TABS
25.0000 mg | ORAL_TABLET | Freq: Three times a day (TID) | ORAL | Status: DC
  Administered 2017-03-28 – 2017-03-29 (×2): 25 mg via ORAL
  Filled 2017-03-28 (×3): qty 0.5

## 2017-03-28 MED ORDER — APIXABAN 5 MG PO TABS
5.0000 mg | ORAL_TABLET | Freq: Two times a day (BID) | ORAL | Status: DC
  Filled 2017-03-28: qty 1

## 2017-03-28 MED ORDER — ONDANSETRON HCL 4 MG/2ML IJ SOLN
4.0000 mg | Freq: Once | INTRAMUSCULAR | Status: AC
  Administered 2017-03-31: 4 mg via INTRAVENOUS
  Filled 2017-03-28: qty 2

## 2017-03-28 MED ORDER — HYDRALAZINE HCL 20 MG/ML IJ SOLN: 10.0000 mg | Freq: Three times a day (TID) | INTRAMUSCULAR | Status: DC

## 2017-03-28 MED ORDER — FUROSEMIDE 10 MG/ML IJ SOLN
80.0000 mg | Freq: Two times a day (BID) | INTRAMUSCULAR | Status: DC
  Administered 2017-03-28: 80 mg via INTRAVENOUS
  Filled 2017-03-28 (×3): qty 8

## 2017-03-28 MED ORDER — IPRATROPIUM-ALBUTEROL 0.5-2.5 (3) MG/3ML IN SOLN
3.0000 mL | RESPIRATORY_TRACT | Status: DC | PRN
  Filled 2017-03-28: qty 3

## 2017-03-28 MED ORDER — HYDRALAZINE HCL 20 MG/ML IJ SOLN
10.0000 mg | INTRAMUSCULAR | Status: DC | PRN
  Administered 2017-03-29: 10 mg via INTRAVENOUS
  Filled 2017-03-28: qty 1

## 2017-03-28 MED ORDER — ROSUVASTATIN CALCIUM 40 MG PO TABS
40.0000 mg | ORAL_TABLET | Freq: Every day | ORAL | Status: DC
  Administered 2017-03-28 – 2017-04-03 (×7): 40 mg via ORAL
  Filled 2017-03-28 (×7): qty 1

## 2017-03-28 MED ORDER — GABAPENTIN 300 MG PO CAPS
300.0000 mg | ORAL_CAPSULE | Freq: Two times a day (BID) | ORAL | Status: DC
  Administered 2017-03-29 – 2017-04-03 (×12): 300 mg via ORAL
  Filled 2017-03-28 (×12): qty 1

## 2017-03-28 MED ORDER — DULOXETINE HCL 30 MG PO CPEP
30.0000 mg | ORAL_CAPSULE | Freq: Every day | ORAL | Status: DC
  Administered 2017-03-29 – 2017-04-03 (×6): 30 mg via ORAL
  Filled 2017-03-28 (×6): qty 1

## 2017-03-28 MED ORDER — VANCOMYCIN HCL 10 G IV SOLR
2500.0000 mg | Freq: Once | INTRAVENOUS | Status: AC
  Administered 2017-03-28: 2500 mg via INTRAVENOUS
  Filled 2017-03-28: qty 2500

## 2017-03-28 MED ORDER — VANCOMYCIN HCL 10 G IV SOLR
1500.0000 mg | INTRAVENOUS | Status: DC
  Administered 2017-03-29: 1500 mg via INTRAVENOUS
  Filled 2017-03-28: qty 1500

## 2017-03-28 MED ORDER — LORAZEPAM 2 MG/ML IJ SOLN
0.5000 mg | Freq: Once | INTRAMUSCULAR | Status: AC
  Administered 2017-03-28: 0.5 mg via INTRAVENOUS
  Filled 2017-03-28: qty 1

## 2017-03-28 MED ORDER — SODIUM CHLORIDE 0.9 % IV SOLN: 250.0000 mL | INTRAVENOUS | Status: DC | PRN

## 2017-03-28 MED ORDER — LEVOFLOXACIN IN D5W 750 MG/150ML IV SOLN
750.0000 mg | INTRAVENOUS | Status: DC
  Administered 2017-03-28: 750 mg via INTRAVENOUS
  Filled 2017-03-28 (×2): qty 150

## 2017-03-28 MED ORDER — FAMOTIDINE 20 MG PO TABS
20.0000 mg | ORAL_TABLET | Freq: Every day | ORAL | Status: DC
  Administered 2017-03-28 – 2017-04-03 (×7): 20 mg via ORAL
  Filled 2017-03-28 (×7): qty 1

## 2017-03-28 MED ORDER — DEXTROSE 5 % IV SOLN
2.0000 g | Freq: Three times a day (TID) | INTRAVENOUS | Status: DC
  Administered 2017-03-28 – 2017-03-30 (×5): 2 g via INTRAVENOUS
  Filled 2017-03-28 (×7): qty 2

## 2017-03-28 MED ORDER — FUROSEMIDE 10 MG/ML IJ SOLN
80.0000 mg | Freq: Once | INTRAMUSCULAR | Status: AC
  Administered 2017-03-28: 80 mg via INTRAVENOUS
  Filled 2017-03-28: qty 8

## 2017-03-28 MED ORDER — NITROGLYCERIN IN D5W 200-5 MCG/ML-% IV SOLN
0.0000 ug/min | INTRAVENOUS | Status: DC
  Administered 2017-03-28: 12:00:00 via INTRAVENOUS
  Administered 2017-03-29 (×2): 120 ug/min via INTRAVENOUS
  Administered 2017-03-29: 180 ug/min via INTRAVENOUS
  Filled 2017-03-28 (×3): qty 250

## 2017-03-28 MED ORDER — HEPARIN (PORCINE) IN NACL 100-0.45 UNIT/ML-% IJ SOLN
1500.0000 [IU]/h | INTRAMUSCULAR | Status: DC
  Administered 2017-03-28 – 2017-03-29 (×2): 1200 [IU]/h via INTRAVENOUS
  Administered 2017-03-30 – 2017-03-31 (×3): 1400 [IU]/h via INTRAVENOUS
  Administered 2017-04-01 – 2017-04-02 (×3): 1500 [IU]/h via INTRAVENOUS
  Filled 2017-03-28 (×8): qty 250

## 2017-03-28 MED ORDER — ALBUTEROL SULFATE (2.5 MG/3ML) 0.083% IN NEBU: 2.5000 mg | INHALATION_SOLUTION | RESPIRATORY_TRACT | Status: DC | PRN

## 2017-03-28 MED ORDER — HYDRALAZINE HCL 20 MG/ML IJ SOLN
10.0000 mg | INTRAMUSCULAR | Status: DC
  Filled 2017-03-28 (×4): qty 0.5

## 2017-03-28 MED ORDER — IPRATROPIUM-ALBUTEROL 0.5-2.5 (3) MG/3ML IN SOLN
3.0000 mL | Freq: Four times a day (QID) | RESPIRATORY_TRACT | Status: DC
  Administered 2017-03-28 – 2017-03-29 (×2): 3 mL via RESPIRATORY_TRACT
  Filled 2017-03-28 (×3): qty 3

## 2017-03-28 MED ORDER — PROMETHAZINE HCL 25 MG/ML IJ SOLN
12.5000 mg | Freq: Three times a day (TID) | INTRAMUSCULAR | Status: DC | PRN
  Administered 2017-03-29 (×3): 12.5 mg via INTRAVENOUS
  Filled 2017-03-28 (×3): qty 1

## 2017-03-28 MED ORDER — NITROGLYCERIN IN D5W 200-5 MCG/ML-% IV SOLN
INTRAVENOUS | Status: AC
  Filled 2017-03-28: qty 250

## 2017-03-28 MED ORDER — CARVEDILOL 3.125 MG PO TABS: 3.1250 mg | ORAL_TABLET | Freq: Two times a day (BID) | ORAL | Status: DC

## 2017-03-28 MED ORDER — HYDRALAZINE HCL 25 MG PO TABS
25.0000 mg | ORAL_TABLET | Freq: Three times a day (TID) | ORAL | Status: DC
  Filled 2017-03-28: qty 1

## 2017-03-28 MED ORDER — INSULIN ASPART 100 UNIT/ML ~~LOC~~ SOLN
0.0000 [IU] | SUBCUTANEOUS | Status: DC
  Administered 2017-03-28: 1 [IU] via SUBCUTANEOUS
  Administered 2017-03-29 (×3): 2 [IU] via SUBCUTANEOUS
  Administered 2017-03-29: 3 [IU] via SUBCUTANEOUS
  Administered 2017-03-29 (×2): 1 [IU] via SUBCUTANEOUS
  Administered 2017-03-30: 2 [IU] via SUBCUTANEOUS
  Administered 2017-03-30: 1 [IU] via SUBCUTANEOUS
  Administered 2017-03-30: 2 [IU] via SUBCUTANEOUS
  Administered 2017-03-30 (×3): 1 [IU] via SUBCUTANEOUS
  Administered 2017-03-31: 2 [IU] via SUBCUTANEOUS
  Administered 2017-03-31: 1 [IU] via SUBCUTANEOUS
  Administered 2017-03-31: 2 [IU] via SUBCUTANEOUS
  Administered 2017-04-01 (×3): 1 [IU] via SUBCUTANEOUS

## 2017-03-28 NOTE — H&P (Signed)
PULMONARY / CRITICAL CARE MEDICINE   Name: Christopher [name] MRN: [id number] DOB: 1961-07-01    ADMISSION DATE:  03/28/2017 CONSULTATION DATE:  03/28/2017  REFERRING MD:  Jacalyn Lefevre, M.D. / EDP  CHIEF COMPLAINT:  Acute Hypoxic Respiratory Failure   HISTORY OF PRESENT ILLNESS:  56 y.o. male with known history of combined systolic and diastolic congestive heart failure. Recently admitted with acute renal failure. Patient was instructed to begin Demadex on 8/19. Aldactone and Entresto held following admission. Per documentation patient follows with Dr. Shirlee Latch for his heart failure. He also has a known history of coronary artery disease post bypass. Patient seen on 8/10 for nausea, palpitations, and poor by mouth intake in the emergency department. Initially thought to have a viral upper respiratory infection and possible withdrawal from gabapentin which she had stopped abruptly the week prior. Patient was noted to have a acute renal failure and IV fluid bolus was administered. Patient's creatinine continued to rise despite IV fluid hydration. Nephrology and cardiology were consulted during the admission. Cardiology planned to restart lower dosed torsemide on 8/19 as well as low-dose Coreg twice daily if his blood pressure tolerated. On the day of discharge the patient's blood pressure was 100/61 and he was saturating 98% with a heart rate of 55 bpm. patient reports his dyspnea started abruptly today. Does endorse some subjective fever as well as hot and cold chills. No audible wheezing per patient and family at bedside but he has had a mild nonproductive cough. No chest pain, pressure, or tightness. Denies any sore throat or sinus congestion. No abdominal pain, nausea, or vomiting. He has had some mild diarrhea. Patient reports he has been taking his Coreg and diuretics as prescribed. He has also been taking his systemic anticoagulation. In the emergency department he was started on a nitroglycerin drip  for his hypertension and given 2 doses of Lasix 80 mg IV. Patient has had minimal urine output.  PAST MEDICAL HISTORY :  Past Medical History:  Diagnosis Date  . Anxiety   . CHF (congestive heart failure) (HCC)    Hattie Perch 03/23/2017  . CKD (chronic kidney disease), stage III    Hattie Perch 03/23/2017  . Complete heart block (HCC) 10/20/2015  . COPD (chronic obstructive pulmonary disease) (HCC)    pt denies this hx on 03/23/2017  . Coronary artery disease   . High cholesterol   . Hypertension   . Myocardial infarction [facility]) 10/2015   "massive one; saw scar tissue of possible previous MI maybe in 2013" (03/23/2017)  . Stenosis of right carotid artery   . TIA (transient ischemic attack) 2016    PAST SURGICAL HISTORY: Past Surgical History:  Procedure Laterality Date  . CARDIAC CATHETERIZATION N/A 10/20/2015   Procedure: Left Heart Cath and Coronary Angiography;  Surgeon: Tonny Bollman, MD;  Location: [facility] INVASIVE CV LAB;  Service: Cardiovascular;  Laterality: N/A;  . CARDIAC CATHETERIZATION N/A 11/02/2015   Procedure: Right/Left Heart Cath and Coronary/Graft Angiography;  Surgeon: Laurey Morale, MD;  Location: [facility] INVASIVE CV LAB;  Service: Cardiovascular;  Laterality: N/A;  . CORONARY ARTERY BYPASS GRAFT N/A 10/27/2015   Procedure: CORONARY ARTERY BYPASS GRAFTING (CABG) x four, using left internal mammary artery and rt leg greater saphenous vein harvested endoscopically;  Surgeon: Kerin Perna, MD;  Location: [facility] OR;  Service: Open Heart Surgery;  Laterality: N/A;  . ENDARTERECTOMY Right 10/27/2015   Procedure: RIGHT ENDARTERECTOMY CAROTID with patch angioplasty using Xenosure bovine pericardium patch;  Surgeon: Fransisco Hertz, MD;  Location: [facility] OR;  Service: Vascular;  Laterality: Right;  . EYE MUSCLE SURGERY Left 1970s   "lazy eye"  . LACERATION REPAIR Right 2003   RUE; "fell thru plate-glass window"  . TEE WITHOUT CARDIOVERSION N/A 10/27/2015   Procedure: TRANSESOPHAGEAL ECHOCARDIOGRAM (TEE);   Surgeon: Kerin Perna, MD;  Location: [facility] OR;  Service: Open Heart Surgery;  Laterality: N/A;    Allergies  Allergen Reactions  . Losartan Shortness Of Breath  . Elita Quick [Ceftazidime] Rash    Rash with blisters  . Lisinopril Rash    No current facility-administered medications on file prior to encounter.    Current Outpatient Prescriptions on File Prior to Encounter  Medication Sig  . apixaban (ELIQUIS) 5 MG TABS tablet Take 1 tablet (5 mg total) by mouth 2 (two) times daily.  . carvedilol (COREG) 3.125 MG tablet Take 1 tablet (3.125 mg total) by mouth 2 (two) times daily. Start if BP above 120's/ 80's  . DULoxetine (CYMBALTA) 30 MG capsule Take 30 mg by mouth daily.  Marland Kitchen gabapentin (NEURONTIN) 300 MG capsule Take 1 capsule (300 mg total) by mouth 2 (two) times daily.  . QUEtiapine (SEROQUEL) 25 MG tablet Take 12.5-25 mg by mouth at bedtime.   . ranitidine (ZANTAC) 150 MG tablet Take 150 mg by mouth 2 (two) times daily.  . rosuvastatin (CRESTOR) 40 MG tablet TAKE 1 TABLET BY MOUTH ONCE DAILY  . senna-docusate (SENOKOT-S) 8.6-50 MG tablet Take 1 tablet by mouth at bedtime. For constipation  . torsemide (DEMADEX) 20 MG tablet Take 1 tablet (20 mg total) by mouth daily. Start on 03/26/17    FAMILY HISTORY:  Family History  Problem Relation Age of Onset  . Heart disease Mother   . Failure to thrive Father   . Congestive Heart Failure Father        Deceased  . Breast cancer Sister        Living    SOCIAL HISTORY: Social History   Social History  . Marital status: Single    Spouse name: N/A  . Number of children: N/A  . Years of education: N/A   Social History Main Topics  . Smoking status: Former Smoker    Packs/day: 1.00    Years: 23.00    Types: Cigarettes    Quit date: 2004  . Smokeless tobacco: Never Used  . Alcohol use No     Comment: Previously drank 24 beer/daily for 27 years, but has been sober since January 08, 2002  . Drug use: Yes    Types: Marijuana      Comment: 03/23/2017 "daily"  . Sexual activity: Yes   Other Topics Concern  . None   Social History Narrative   Lives with girlfriend in an apartment on the second floor.     Works as a Teaching laboratory technician for apartment complex.     Has no children.  Education: 10th grade.    REVIEW OF SYSTEMS:  No dysuria or hematuria. No new rashes or bruising. A pertinent 14 point review of systems is negative except as per the history of presenting illness.  SUBJECTIVE: As above.  VITAL SIGNS: BP (!) 131/103   Pulse (!) 109   Resp (!) 25   SpO2 98%   HEMODYNAMICS:    VENTILATOR SETTINGS: Vent Mode: BIPAP;PSV FiO2 (%):  [60 %-100 %] 60 % PEEP:  [5 cmH20] 5 cmH20 Pressure Support:  [8 cmH20] 8 cmH20  INTAKE / OUTPUT: No intake/output data recorded.  PHYSICAL EXAMINATION:  General:  Awake. Alert. No acute distress. Family at bedside.  Integument:  Warm & dry. No rash on exposed skin.  Extremities:  No cyanosis or clubbing.  Lymphatics:  No appreciated cervical or supraclavicular lymphadenoapthy. HEENT:  Moist mucus membranes. No scleral injection or icterus. BiPAP mask in place. Cardiovascular:  Mildly tachycardic with irregular rhythm. No edema. Unable to appreciate JVD.  Pulmonary:  Coarse, rhonchorous breath sounds bilaterally. Mildly increased work of breathing on BiPAP. Abdomen: Soft. Normal bowel sounds. Protuberant. Grossly nontender. Musculoskeletal:  Normal bulk and tone. No joint deformity or effusion appreciated. Neurological:  Symmetric face. No meningismus. Moving all 4 extremities equally.  Psychiatric:  Mood and affect congruent. Speech somewhat pressured on BiPAP.  LABS:  BMET  Recent Labs Lab 03/23/17 1405 03/24/17 0339 03/28/17 1315  NA 136 137 139  K 4.3 4.0 5.3*  CL 104 106 106  CO2 21* 22 16*  BUN 69* 61* 24*  CREATININE 5.05* 3.65* 2.86*  GLUCOSE 145* 93 296*    Electrolytes  Recent Labs Lab 03/23/17 1405 03/24/17 0339 03/28/17 1315   CALCIUM 9.2 9.0 9.3    CBC  Recent Labs Lab 03/23/17 1405 03/24/17 0339 03/28/17 1137  WBC 6.8 6.1 15.3*  HGB 12.4* 12.3* 16.2  HCT 36.8* 37.0* 47.3  PLT 154 143* 223    Coag's No results for input(s): APTT, INR in the last 168 hours.  Sepsis Markers No results for input(s): LATICACIDVEN, PROCALCITON, O2SATVEN in the last 168 hours.  ABG No results for input(s): PHART, PCO2ART, PO2ART in the last 168 hours.  Liver Enzymes  Recent Labs Lab 03/23/17 1405 03/28/17 1315  AST 21 33  ALT 24 23  ALKPHOS 79 105  BILITOT 1.0 1.2  ALBUMIN 4.2 4.3    Cardiac Enzymes  Recent Labs Lab 03/28/17 1137  TROPONINI 0.04*    Glucose No results for input(s): GLUCAP in the last 168 hours.  Imaging Dg Chest Port 1 View  Result Date: 03/28/2017 CLINICAL DATA:  Severe shortness of breath began today. History of CHF, COPD, former smoker. EXAM: PORTABLE CHEST 1 VIEW COMPARISON:  Chest x-ray of March 17, 2017 FINDINGS: The lungs are adequately inflated. The interstitial markings are increased. The pulmonary vascularity is engorged. The cardiac silhouette is enlarged. The patient has undergone previous CABG. No pleural effusion is observed. IMPRESSION: CHF with pulmonary interstitial edema. The appearance of the chest has deteriorated since the study of 11 days ago. Electronically Signed   By: David  [place] M.D.   On: 03/28/2017 11:54    STUDIES:  PFT 10/26/15: FVC 3.46 L (68%] the FEV1 2.59 L (66%) FEV1/FVC 0.75 FEF 25-75 1.91 L (57%) negative bronchodilator response RHC 11/02/15:  RA mean: 19  RV: 44/23  PA: 43/25 (33)  Pulse Later wedge pressure mean: 26  Aortic pressure: 124/71  PA saturation: 64%  Aortic saturation: 100% RENAL U/S 8/16:  Negative renal ultrasound. TTE 03/02/17:  LV normal in size with moderate focal basal hypertrophy. EF 40-45% with diffuse hypokinesis & akinesis of the apical myocardium. Grade 2 diastolic dysfunction. LA & RA normal in size. RV  mildly dilated with preserved systolic function. No aortic stenosis or regurgitation. Aortic root mildly dilated. No mitral stenosis or regurgitation. No pulmonic stenosis or regurgitation. Mild tricuspid regurgitation. No pericardial effusion. PORT CXR 8/21:  Personally reviewed by me. Compared with previous x-ray from 8/10. Patient demonstrates significant increase in bilateral interstitial markings consistent with pulmonary edema as well as silhouetting of bilateral hemidiaphragms with lower lung  hazy opacification consistent with pleural effusions. No consolidation appreciated or air bronchograms. Mediastinum otherwise normal in contour.  MICROBIOLOGY: None.  ANTIBIOTICS: None.  SIGNIFICANT EVENTS: 8/17 - Discharge from hospital after admission with acute renal failure >> diuretics held  8/21 - Presented from heart failure clinic with hypoxic respiratory failure  LINES/TUBES: FOLEY 8/21 >>> PIV  ASSESSMENT / PLAN:  PULMONARY A: Acute hypoxic respiratory failure: Pulmonary edema versus healthcare associated pneumonia. Possible healthcare associated pneumonia COPD: No signs of acute exacerbation.  P:   Continuing to wean FiO2 Holding on further diuresis until evaluated by cardiology Noninvasive positive pressure ventilation for increased work of breathing Continuous pulse oximetry monitoring Continuing DuoNeb when necessary for any coughing or shortness of breath  CARDIOVASCULAR A:  Hypertensive emergency: Likely secondary to withholding medications. Possible acute on chronic diastolic & systolic congestive heart failure  History of atrial fibrillation/flutter History of coronary artery disease History of essential hypertension History of hyperlipidemia History of stenosis of right carotid artery  P:  Cardiology consulted by emergency room provider Weaning off nitroglycerin drip Hydralazine IV when necessary to maintain BP parameters Monitor vitals per  protocol Continuous telemetry monitoring Continuing home Coreg & Crestor Holding on further diuresis  RENAL A:   Acute on chronic renal failure stage III: Improving as diuretics were withheld outpatient. Metabolic acidosis: Mild. Hyperkalemia: Mild.  P:   Trending urine output with Foley catheter Monitor electrolytes and renal function daily Checking/trending lactic acid  GASTROINTESTINAL A:   No acute issues.  P:   Nothing by mouth except for medicines with sips Starting Pepcid 20 mg by mouth twice a day in place of Zantac  HEMATOLOGIC A:   Leukocytosis: Possibly secondary to sepsis. Coagulopathy: Secondary to chronic anticoagulation with Eliquis.  P:  Trending cell counts daily with CBC Holding Eliquis SCDs for DVT prophylaxis  INFECTIOUS A:   Possible sepsis Possible healthcare associated pneumonia  P:   Starting empiric antibiotic therapy with vancomycin, aztreonam, and Levaquin given allergy to cephalosporins Trending Procalcitonin per algorithm Checking stat blood cultures, urine Legionella antigen, and urine streptococcal antigen Low threshold to de-escalate antibiotic therapy  ENDOCRINE A:   Hyperglycemia: No history of diabetes mellitus.  P:   Checking hemoglobin A1c Accu-Cheks every 4 hours while nothing by mouth Sliding-scale insulin per sensitive algorithm  NEUROLOGIC A:   No acute issues.  History of anxiety History of TIA  P:   Continuing home Neurontin & Cymbalta Holding Seroquel Continuing Crestor   FAMILY  - Updates:  Patient and family updated at the time of my exam regarding plan.   - Inter-disciplinary family meet or Palliative Care meeting due by:  8/28  DISCUSSION:  56 y.o.  male with uncontrolled hypertension/hypertensive urgency and acute hypoxic respiratory failure. Question possible component of pneumonia to patient's hypoxia. Physical exam does not reveal grossly volume overloaded status. Holding on further diuresis  pending cardiology evaluation. Treating with broad-spectrum antibiotics given her recent admission and tenuous clinical status. Admitting to the intensive care unit for close monitoring given potential for further respiratory decompensation and need for endotracheal intubation. Patient, girlfriend, and sister all confirm full CODE STATUS.   I have spent a total of 38 minutes of critical care time today caring for the patient, updating patient/family at bedside, and reviewing the patient's electronic medical record.   Donna Christen Jamison Neighbor, M.D. [facility] Pulmonary & Critical Care Pager:  [phone] After 3pm or if no response, call [phone] 03/28/2017, 3:27 PM

## 2017-03-28 NOTE — Progress Notes (Signed)
Pt called RN to room stating he was feeling really bad, he had increased SOB and was diaphoretic.  CBG 134 O2 85% on RA, placed pt on 2L [place], Dr Shirlee Latch came into room to eval pt.  Pt continued with increased SOB and was taken immediately to ER.

## 2017-03-28 NOTE — Progress Notes (Signed)
eLink Physician-Brief Progress Note Patient Name: Christopher [name] DOB: [birth year] MRN: [id number]   Date of Service  03/28/2017  HPI/Events of Note  Nausea - QTc interval = 529 milliseconds.   eICU Interventions  Will order:  1. Phenergan 12.5 mg IV Q 6 hours PRN nausea and vomiting.      Intervention Category Major Interventions: Other:  [name] 03/28/2017, 11:54 PM

## 2017-03-28 NOTE — Consult Note (Signed)
Advanced Heart Failure Team Consult Note   Primary Physician: Primary Cardiologist:  Dr Shirlee Latch   Reason for Consultation: Heart Failure   HPI:    Christopher [name] is seen today for evaluation of heart failure at the request of Dr Particia Nearing.   Christopher [name] is 56 year old with a history of CAD, CABG x4 and R CEA 2017, ICM, chronic systolic heart failure, and A flutter.   Admitted March 2017 with CP. Had anterior MI and underwent CABG x4 R CEA March 2017. Hospital course was complicated by cardiogenic shock and  A flutter.    He was seen in the CHF clinic in July 2018, noted to be in atrial flutter, rate was controlled. He was started on Eliquis, and DCCV was planned for  month later. He was also started on Entresto and his torsemide was increased for volume overload. He was seen by Dr. Graciela Husbands for consideration of atrial flutter ablation who felt that ablation was not indicated.   Admitted last week with fatigue and cough thought to be secondary to AKI/gabapentin. Creatinine peaked at 5. Entresto, torsemide, and spiro stopped. On Sunday he was instructed to restart 20 mg torsemide.   Over the weekend he was in his usual state and able to perform activities. Today he presented the HF clinic and due to acute dyspnea he was sent to [facility] ED for evaluation. CXR was worse with edema noted. Pertinent admission labs include: BNP 1836, K 5.3, creatinine 2.86, and WBC 15.3 . Oxygen saturations in the ED in the low 80s so BiPap started. Received 80 mg IV lasix with poor response.      Review of Systems: [y] = yes, [ ]  = no   General: Weight gain [ ] ; Weight loss [ ] ; Anorexia [ ] ; Fatigue [Y ]; Fever [ ] ; Chills [Y ]; Weakness [ ]   Cardiac: Chest pain/pressure [ ] ; Resting SOB [Y ]; Exertional SOB [ ] ; Orthopnea [ ] ; Pedal Edema [Y ]; Palpitations [ ] ; Syncope [ ] ; Presyncope [ ] ; Paroxysmal nocturnal dyspnea[ ]   Pulmonary: Cough [ ] ; Wheezing[ ] ; Hemoptysis[ ] ; Sputum [ ] ; Snoring [ ]   GI:  Vomiting[ ] ; Dysphagia[ ] ; Melena[ ] ; Hematochezia [ ] ; Heartburn[ ] ; Abdominal pain [ ] ; Constipation [ ] ; Diarrhea [ ] ; BRBPR [ ]   GU: Hematuria[ ] ; Dysuria [ ] ; Nocturia[ ]   Vascular: Pain in legs with walking [ ] ; Pain in feet with lying flat [ ] ; Non-healing sores [ ] ; Stroke [ ] ; TIA [ ] ; Slurred speech [ ] ;  Neuro: Headaches[ ] ; Vertigo[ ] ; Seizures[ ] ; Paresthesias[ ] ;Blurred vision [ ] ; Diplopia [ ] ; Vision changes [ ]   Ortho/Skin: Arthritis [ ] ; Joint pain [Y ]; Muscle pain [ ] ; Joint swelling [ ] ; Back Pain [ Y]; Rash [ ]   Psych: Depression[ ] ; Anxiety[ ]   Heme: Bleeding problems [ ] ; Clotting disorders [ ] ; Anemia [ ]   Endocrine: Diabetes [ ] ; Thyroid dysfunction[ ]   Home Medications Prior to Admission medications   Medication Sig Start Date End Date Taking? Authorizing Provider  apixaban (ELIQUIS) 5 MG TABS tablet Take 1 tablet (5 mg total) by mouth 2 (two) times daily. 02/06/17  Yes Laurey Morale, MD  carvedilol (COREG) 3.125 MG tablet Take 1 tablet (3.125 mg total) by mouth 2 (two) times daily. Start if BP above 120's/ 80's 03/25/17 03/25/18 Yes Suzzette Righter E, NP  DULoxetine (CYMBALTA) 30 MG capsule Take 30 mg by mouth daily.   Yes [provider]  gabapentin (NEURONTIN) 300 MG capsule Take 1 capsule (300 mg total) by mouth 2 (two) times daily. 03/24/17  Yes Rai, Ripudeep K, MD  QUEtiapine (SEROQUEL) 25 MG tablet Take 12.5-25 mg by mouth at bedtime.    Yes [provider]  ranitidine (ZANTAC) 150 MG tablet Take 150 mg by mouth 2 (two) times daily.   Yes [provider]  rosuvastatin (CRESTOR) 40 MG tablet TAKE 1 TABLET BY MOUTH ONCE DAILY 03/20/17  Yes Bensimhon, Bevelyn Buckles, MD  senna-docusate (SENOKOT-S) 8.6-50 MG tablet Take 1 tablet by mouth at bedtime. For constipation 12/01/15  Yes Love, Evlyn Kanner, PA-C  torsemide (DEMADEX) 20 MG tablet Take 1 tablet (20 mg total) by mouth daily. Start on 03/26/17 03/26/17  Yes Little Ishikawa, NP    Past Medical  History: Past Medical History:  Diagnosis Date  . Anxiety   . CHF (congestive heart failure) (HCC)    Hattie Perch 03/23/2017  . CKD (chronic kidney disease), stage III    Hattie Perch 03/23/2017  . Complete heart block (HCC) 10/20/2015  . COPD (chronic obstructive pulmonary disease) (HCC)    pt denies this hx on 03/23/2017  . Coronary artery disease   . High cholesterol   . Hypertension   . Myocardial infarction [facility]) 10/2015   "massive one; saw scar tissue of possible previous MI maybe in 2013" (03/23/2017)  . Stenosis of right carotid artery   . TIA (transient ischemic attack) 2016    Past Surgical History: Past Surgical History:  Procedure Laterality Date  . CARDIAC CATHETERIZATION N/A 10/20/2015   Procedure: Left Heart Cath and Coronary Angiography;  Surgeon: Tonny Bollman, MD;  Location: [facility] INVASIVE CV LAB;  Service: Cardiovascular;  Laterality: N/A;  . CARDIAC CATHETERIZATION N/A 11/02/2015   Procedure: Right/Left Heart Cath and Coronary/Graft Angiography;  Surgeon: Laurey Morale, MD;  Location: [facility] INVASIVE CV LAB;  Service: Cardiovascular;  Laterality: N/A;  . CORONARY ARTERY BYPASS GRAFT N/A 10/27/2015   Procedure: CORONARY ARTERY BYPASS GRAFTING (CABG) x four, using left internal mammary artery and rt leg greater saphenous vein harvested endoscopically;  Surgeon: Kerin Perna, MD;  Location: [place] OR;  Service: Open Heart Surgery;  Laterality: N/A;  . ENDARTERECTOMY Right 10/27/2015   Procedure: RIGHT ENDARTERECTOMY CAROTID with patch angioplasty using Xenosure bovine pericardium patch;  Surgeon: Fransisco Hertz, MD;  Location: [facility] OR;  Service: Vascular;  Laterality: Right;  . EYE MUSCLE SURGERY Left 1970s   "lazy eye"  . LACERATION REPAIR Right 2003   RUE; "fell thru plate-glass window"  . TEE WITHOUT CARDIOVERSION N/A 10/27/2015   Procedure: TRANSESOPHAGEAL ECHOCARDIOGRAM (TEE);  Surgeon: Kerin Perna, MD;  Location: [facility] OR;  Service: Open Heart Surgery;  Laterality: N/A;    Family  History: Family History  Problem Relation Age of Onset  . Heart disease Mother   . Failure to thrive Father   . Congestive Heart Failure Father        Deceased  . Breast cancer Sister        Living    Social History: Social History   Social History  . Marital status: Single    Spouse name: N/A  . Number of children: N/A  . Years of education: N/A   Social History Main Topics  . Smoking status: Former Smoker    Packs/day: 1.00    Years: 23.00    Types: Cigarettes    Quit date: 2004  . Smokeless tobacco: Never Used  . Alcohol use No  Comment: Previously drank 24 beer/daily for 27 years, but has been sober since January 08, 2002  . Drug use: Yes    Types: Marijuana     Comment: 03/23/2017 "daily"  . Sexual activity: Yes   Other Topics Concern  . None   Social History Narrative   Lives with girlfriend in an apartment on the second floor.     Works as a Teaching laboratory technician for apartment complex.     Has no children.  Education: 10th grade.    Allergies:  Allergies  Allergen Reactions  . Losartan Shortness Of Breath  . Elita Quick [Ceftazidime] Rash    Rash with blisters  . Lisinopril Rash    Objective:    Vital Signs:   Pulse Rate:  [109-113] 109 (08/21 1300) Resp:  [25-42] 25 (08/21 1330) BP: (131-176)/(103-154) 131/103 (08/21 1330) SpO2:  [91 %-100 %] 98 % (08/21 1515) FiO2 (%):  [60 %-100 %] 60 % (08/21 1515)    Weight change: There were no vitals filed for this visit.  Intake/Output:  No intake or output data in the 24 hours ending 03/28/17 1559    Physical Exam    General:  On bipap HEENT: normal Neck: supple. JVP ahrd to assess with bipap  . Carotids 2+ bilat; no bruits. No lymphadenopathy or thyromegaly appreciated. Cor: PMI nondisplaced. Irregular rate & rhythm. No rubs, gallops or murmurs. Lungs: crackles RML RLL LLL Abdomen: soft, nontender, distended. No hepatosplenomegaly. No bruits or masses. Good bowel sounds. Extremities: no  cyanosis, clubbing, rash, edema Neuro: alert & orientedx3, cranial nerves grossly intact. moves all 4 extremities w/o difficulty. Affect pleasant   Telemetry    A flutter 100s   EKG   A flutter 112 bpm EKG in he ED    Labs   Basic Metabolic Panel:  Recent Labs Lab 03/23/17 1405 03/24/17 0339 03/28/17 1315  NA 136 137 139  K 4.3 4.0 5.3*  CL 104 106 106  CO2 21* 22 16*  GLUCOSE 145* 93 296*  BUN 69* 61* 24*  CREATININE 5.05* 3.65* 2.86*  CALCIUM 9.2 9.0 9.3    Liver Function Tests:  Recent Labs Lab 03/23/17 1405 03/28/17 1315  AST 21 33  ALT 24 23  ALKPHOS 79 105  BILITOT 1.0 1.2  PROT 6.9 7.8  ALBUMIN 4.2 4.3   No results for input(s): LIPASE, AMYLASE in the last 168 hours. No results for input(s): AMMONIA in the last 168 hours.  CBC:  Recent Labs Lab 03/23/17 1405 03/24/17 0339 03/28/17 1137  WBC 6.8 6.1 15.3*  NEUTROABS  --   --  9.9*  HGB 12.4* 12.3* 16.2  HCT 36.8* 37.0* 47.3  MCV 93.9 94.9 95.7  PLT 154 143* 223    Cardiac Enzymes:  Recent Labs Lab 03/24/17 0743 03/28/17 1137  CKTOTAL 324  --   TROPONINI  --  0.04*    BNP: BNP (last 3 results)  Recent Labs  12/06/16 1429 02/06/17 1136 03/28/17 1137  BNP 309.3* 330.6* 1,836.9*    ProBNP (last 3 results) No results for input(s): PROBNP in the last 8760 hours.   CBG: No results for input(s): GLUCAP in the last 168 hours.  Coagulation Studies: No results for input(s): LABPROT, INR in the last 72 hours.   Imaging   Dg Chest Port 1 View  Result Date: 03/28/2017 CLINICAL DATA:  Severe shortness of breath began today. History of CHF, COPD, former smoker. EXAM: PORTABLE CHEST 1 VIEW COMPARISON:  Chest x-ray of March 17, 2017 FINDINGS: The lungs are adequately inflated. The interstitial markings are increased. The pulmonary vascularity is engorged. The cardiac silhouette is enlarged. The patient has undergone previous CABG. No pleural effusion is observed. IMPRESSION: CHF  with pulmonary interstitial edema. The appearance of the chest has deteriorated since the study of 11 days ago. Electronically Signed   By: David  [place] M.D.   On: 03/28/2017 11:54      Medications:     Current Medications: . ondansetron (ZOFRAN) IV  4 mg Intravenous Once     Infusions: . nitroGLYCERIN 10 mcg/min (03/28/17 1422)       Patient Profile   Christopher [name] is 56 year old with a history of CAD, CABG x4 and R CEA 2017, ICM, chronic systolic heart failure, and A flutter admitted with acute respiratory failure and a/c systolic heart failure.     Assessment/Plan   1. Acute Respiratory Failure- on bipap. CXR worse compared to last week.    2. A/C Systolic Heart Failure- ICM- Volumeoverloaded. Poor response to initial dose of IV lasix. Give another 80 mg IV lasix now and one more dose dose later tonight. Hold bb for now. Theadora Rama start hydralazine.  3. CKD Stage IV- recent AKI with last weeks admit. Creatinine peaked at 5.  Cleda Daub and entresto stopped. Renal US was ok on 03/23/17. Follow renal function closely.  4. ID: elevated WBC  On admit. Lactic Acid, blood cx pending.  5. HTN- continue nitro drip. Titrate to < SBP 140 and DBP < 90.  6. A flutter- continue eliquis 5 mg twice daily. May need dc-cv after volume status improves.  7. CAD- S/P CABG x4 in 2017. No CP. Continue statin and bb.   HF team will follow with you.  Length of Stay: 0  Tonye Becket, NP  03/28/2017, 3:59 PM  Advanced Heart Failure Team Pager [phone] (M-F; 7a - 4p)  Please contact [facility] for night-coverage after hours (4p -7a ) and weekends on [email or web address]  1. Acute hypoxemic respiratory failure: Suspect flash pulmonary edema in setting of hypertensive emergency.  He has been off Entresto and spironolactone and on lower dose of Coreg since recent admission with AKI.  He has also been on less torsemide.  CXR with CHF, volume overloaded on exam, BNP up.  - Control BP with NTG gtt.  - Lasix  diuresis.  - Bipap.  2. CAD: s/p CABG.  Presented with dyspnea rather than chest pain.  TnI 0.04 is likely demand ischemia with volume overload.  - Cycle troponin.    - Continue Crestor.   - No ASA given apixaban use.  3. Atrial flutter: Persistent atypical flutter.  He has seen EP and they are deciding +/- ablation.  HR currently in 100s. . - Continue apixaban.   - We will arrange for DCCV when he is more stable.  Need to make sure that he has been on apixaban for about a month (looks like he has).  4. Acute on chronic systolic CHF: Ischemic cardiomyopathy.  Echo in 3/17 with EF 30-35%, improved to 40-45% on echo in 7/18 with mildly dilated RV.  Entresto, spironolactone on hold and on lower doses of Lasix and Coreg in setting of AKI.  Now with flash pulmonary edema and hypertensive emergency.  He is volume overloaded on exam with pulmonary edema on CXR.  - Control BP with NTG gtt, titrate up.  Will add hydralazine 25 mg tid.  - Continue to hold Entresto and spironolactone with  creatinine 2.86.  - Lasix 80 mg IV bid, will give a dose now.  - Restart Coreg when pulmonary edema under better control (?tomorrow).  5. AKI on CKD stage 3: Recent rise in creatinine to 5, meds held.  Now creatinine 2.86. As above, continue to hold Entresto and spironolactone.   6. Carotid stenosis: Right CEA in 2017.  Followed by [facility].    7. PAD: Extensive on last peripheral dopplers, claudication has worsened in the last few months and is now limiting.  Needs followup with Dr. Arbie Cookey as outpatient.   [name] 03/28/2017 5:03 PM

## 2017-03-28 NOTE — Progress Notes (Signed)
Pharmacy Antibiotic Note  FEDERICO SCHARRER is a 56 y.o. male admitted on 03/28/2017 with pneumonia.  Pharmacy has been consulted for Aztreonam, Levaquin and Vancomycin dosing. WBC 15.3. SCr 2.86 (BL~1.8). nCrCl ~ 30 mL/min.   Plan: -Start Aztreonam 2 gm IV Q 8 hours -Levaquin 750 mg IV Q 48 hours -Vancomycin 2.5 gm IV load followed by Vancomycin 1500 mg IV Q 24 hours -Per CCM, low threshold to de-escalate therapy  -Monitor CBC, renal fx, cultures and clinical progress -[facility] at [facility]      No data recorded.   Recent Labs Lab 03/23/17 1405 03/24/17 0339 03/28/17 1137 03/28/17 1315  WBC 6.8 6.1 15.3*  --   CREATININE 5.05* 3.65*  --  2.86*    Estimated Creatinine Clearance: 39 mL/min (A) (by C-G formula based on SCr of 2.86 mg/dL (H)).    Allergies  Allergen Reactions  . Losartan Shortness Of Breath  . Elita Quick [Ceftazidime] Rash    Rash with blisters  . Lisinopril Rash    Antimicrobials this admission: Vanc 8/21 >>  Aztreonam 8/21 >>  Levaquin 8/21 >>   Dose adjustments this admission: None   Microbiology results:   Thank you for allowing pharmacy to be a part of this patient's care.  Vinnie Level, PharmD., BCPS Clinical Pharmacist Pager [phone]

## 2017-03-28 NOTE — ED Provider Notes (Addendum)
[facility]-EMERGENCY DEPT Provider Note   CSN: [id number] Arrival date & time: 03/28/17  1128     History   Chief Complaint Chief Complaint  Patient presents with  . Shortness of Breath    HPI Christopher [name] is a 56 y.o. male.  Pt presents to the ED today with sob.  The pt was in the hospital from 8/16-17 for abnormal renal function.  Bun 69 and Cr 5.  Some of his medications were discontinued.  He had an appointment today at the CHF clinic.  He was immediately taken down here as he was in significant resp distress.  Pt unable to give much hx as he is so sob.      Past Medical History:  Diagnosis Date  . Anxiety   . CHF (congestive heart failure) (HCC)    Hattie Perch 03/23/2017  . CKD (chronic kidney disease), stage III    Hattie Perch 03/23/2017  . Complete heart block (HCC) 10/20/2015  . COPD (chronic obstructive pulmonary disease) (HCC)    pt denies this hx on 03/23/2017  . Coronary artery disease   . High cholesterol   . Hypertension   . Myocardial infarction [facility]) 10/2015   "massive one; saw scar tissue of possible previous MI maybe in 2013" (03/23/2017)  . Stenosis of right carotid artery   . TIA (transient ischemic attack) 2016    Patient Active Problem List   Diagnosis Date Noted  . Acute kidney injury (HCC) 03/23/2017  . Chronic systolic CHF (congestive heart failure) (HCC) 02/07/2016  . Hypokalemia 12/01/2015  . Debility 11/24/2015  . Coronary artery disease involving native coronary artery of native heart without angina pectoris   . VT (ventricular tachycardia) (HCC)   . Ileus (HCC)   . Hyperosmolality and/or hypernatremia   . Acute respiratory failure with hypoxia (HCC)   . Chronic obstructive pulmonary disease (HCC)   . Acute renal failure (HCC)   . Ventilator dependence (HCC)   . Tracheostomy status (HCC)   . S/P CABG x 4 10/27/2015  . CAD (coronary artery disease)   . Hypertensive heart disease with heart failure (HCC)   . Carotid artery stenosis   . Acute  combined systolic and diastolic heart failure (HCC) 10/20/2015  . Complete heart block (HCC) 10/20/2015  . Acute MI, inferior wall, initial episode of care (HCC) 10/20/2015  . TIA (transient ischemic attack) 12/25/2014  . Essential hypertension 12/25/2014  . Hyperlipidemia 12/25/2014    Past Surgical History:  Procedure Laterality Date  . CARDIAC CATHETERIZATION N/A 10/20/2015   Procedure: Left Heart Cath and Coronary Angiography;  Surgeon: Tonny Bollman, MD;  Location: [facility] INVASIVE CV LAB;  Service: Cardiovascular;  Laterality: N/A;  . CARDIAC CATHETERIZATION N/A 11/02/2015   Procedure: Right/Left Heart Cath and Coronary/Graft Angiography;  Surgeon: Laurey Morale, MD;  Location: [facility] INVASIVE CV LAB;  Service: Cardiovascular;  Laterality: N/A;  . CORONARY ARTERY BYPASS GRAFT N/A 10/27/2015   Procedure: CORONARY ARTERY BYPASS GRAFTING (CABG) x four, using left internal mammary artery and rt leg greater saphenous vein harvested endoscopically;  Surgeon: Kerin Perna, MD;  Location: [facility] OR;  Service: Open Heart Surgery;  Laterality: N/A;  . ENDARTERECTOMY Right 10/27/2015   Procedure: RIGHT ENDARTERECTOMY CAROTID with patch angioplasty using Xenosure bovine pericardium patch;  Surgeon: Fransisco Hertz, MD;  Location: [facility] OR;  Service: Vascular;  Laterality: Right;  . EYE MUSCLE SURGERY Left 1970s   "lazy eye"  . LACERATION REPAIR Right 2003   RUE; "fell thru plate-glass window"  .  TEE WITHOUT CARDIOVERSION N/A 10/27/2015   Procedure: TRANSESOPHAGEAL ECHOCARDIOGRAM (TEE);  Surgeon: Kerin Perna, MD;  Location: [facility] OR;  Service: Open Heart Surgery;  Laterality: N/A;       Home Medications    Prior to Admission medications   Medication Sig Start Date End Date Taking? Authorizing Provider  apixaban (ELIQUIS) 5 MG TABS tablet Take 1 tablet (5 mg total) by mouth 2 (two) times daily. 02/06/17  Yes Laurey Morale, MD  carvedilol (COREG) 3.125 MG tablet Take 1 tablet (3.125 mg total) by mouth 2  (two) times daily. Start if BP above 120's/ 80's 03/25/17 03/25/18 Yes Suzzette Righter E, NP  DULoxetine (CYMBALTA) 30 MG capsule Take 30 mg by mouth daily.   Yes [provider]  gabapentin (NEURONTIN) 300 MG capsule Take 1 capsule (300 mg total) by mouth 2 (two) times daily. 03/24/17  Yes Rai, Ripudeep K, MD  QUEtiapine (SEROQUEL) 25 MG tablet Take 12.5-25 mg by mouth at bedtime.    Yes [provider]  ranitidine (ZANTAC) 150 MG tablet Take 150 mg by mouth 2 (two) times daily.   Yes [provider]  rosuvastatin (CRESTOR) 40 MG tablet TAKE 1 TABLET BY MOUTH ONCE DAILY 03/20/17  Yes Bensimhon, Bevelyn Buckles, MD  senna-docusate (SENOKOT-S) 8.6-50 MG tablet Take 1 tablet by mouth at bedtime. For constipation 12/01/15  Yes Love, Evlyn Kanner, PA-C  torsemide (DEMADEX) 20 MG tablet Take 1 tablet (20 mg total) by mouth daily. Start on 03/26/17 03/26/17  Yes Little Ishikawa, NP    Family History Family History  Problem Relation Age of Onset  . Heart disease Mother   . Failure to thrive Father   . Congestive Heart Failure Father        Deceased  . Breast cancer Sister        Living    Social History Social History  Substance Use Topics  . Smoking status: Former Smoker    Packs/day: 1.00    Years: 23.00    Types: Cigarettes    Quit date: 2004  . Smokeless tobacco: Never Used  . 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      Physical Exam Updated Vital Signs BP (!) 131/103   Pulse (!) 109   Resp (!) 25   SpO2 100%   Physical Exam  Constitutional: He is oriented to person, place, and time. He appears distressed.  HENT:  Head: Normocephalic and atraumatic.  Right Ear: External ear normal.  Left  Ear: External ear normal.  Nose: Nose normal.  Mouth/Throat: Oropharynx is clear and moist.  Eyes: Pupils are equal, round, and reactive to light. Conjunctivae and EOM are normal.  Neck: Normal range of motion. Neck supple.  Cardiovascular: Regular rhythm, normal heart sounds and intact distal pulses.  Tachycardia present.   Pulmonary/Chest: Accessory muscle usage present. Tachypnea noted. He is in respiratory distress. He has rales.  Abdominal: Soft. Bowel sounds are normal.  Musculoskeletal: Normal range of motion.  Neurological: He is alert and oriented to person, place, and time.  Skin: Skin is warm. He is diaphoretic.  Psychiatric: His mood appears anxious.  Nursing note and vitals reviewed.    ED  Treatments / Results  Labs (all labs ordered are listed, but only abnormal results are displayed) Labs Reviewed  CBC WITH DIFFERENTIAL/PLATELET - Abnormal; Notable for the following:       Result Value   WBC 15.3 (*)    RDW 16.2 (*)    Neutro Abs 9.9 (*)    Monocytes Absolute 1.4 (*)    Basophils Absolute 0.2 (*)    All other components within normal limits  TROPONIN I - Abnormal; Notable for the following:    Troponin I 0.04 (*)    All other components within normal limits  BRAIN NATRIURETIC PEPTIDE - Abnormal; Notable for the following:    B Natriuretic Peptide 1,836.9 (*)    All other components within normal limits  URINALYSIS, ROUTINE W REFLEX MICROSCOPIC - Abnormal; Notable for the following:    APPearance HAZY (*)    Hgb urine dipstick SMALL (*)    Protein, ur 100 (*)    Bacteria, UA RARE (*)    All other components within normal limits  COMPREHENSIVE METABOLIC PANEL - Abnormal; Notable for the following:    Potassium 5.3 (*)    CO2 16 (*)    Glucose, Bld 296 (*)    BUN 24 (*)    Creatinine, Ser 2.86 (*)    GFR calc non Af Amer 23 (*)    GFR calc Af Amer 27 (*)    Anion gap 17 (*)    All other components within normal limits    EKG  EKG  Interpretation  Date/Time:  Tuesday March 28 2017 11:31:35 EDT Ventricular Rate:  112 PR Interval:    QRS Duration: 114 QT Interval:  327 QTC Calculation: 447 R Axis:   -57 Text Interpretation:  Sinus or ectopic atrial tachycardia Consider left atrial enlargement LVH with secondary repolarization abnormality Inferior infarct, old Anterior infarct, old Artifact in lead(s) II III aVF V5 V6 Confirmed by Jacalyn Lefevre [phone]) on 03/28/2017 11:56:00 AM       Radiology Dg Chest Port 1 View  Result Date: 03/28/2017 CLINICAL DATA:  Severe shortness of breath began today. History of CHF, COPD, former smoker. EXAM: PORTABLE CHEST 1 VIEW COMPARISON:  Chest x-ray of March 17, 2017 FINDINGS: The lungs are adequately inflated. The interstitial markings are increased. The pulmonary vascularity is engorged. The cardiac silhouette is enlarged. The patient has undergone previous CABG. No pleural effusion is observed. IMPRESSION: CHF with pulmonary interstitial edema. The appearance of the chest has deteriorated since the study of 11 days ago. Electronically Signed   By: David  [place] M.D.   On: 03/28/2017 11:54    Procedures Procedures (including critical care time)  Medications Ordered in ED Medications  nitroGLYCERIN 50 mg in dextrose 5 % 250 mL (0.2 mg/mL) infusion (10 mcg/min Intravenous Rate/Dose Change 03/28/17 1422)  ondansetron (ZOFRAN) injection 4 mg (not administered)  furosemide (LASIX) injection 80 mg (80 mg Intravenous Given 03/28/17 1136)  LORazepam (ATIVAN) injection 0.5 mg (0.5 mg Intravenous Given 03/28/17 1340)     Initial Impression / Assessment and Plan / ED Course  I have reviewed the triage vital signs and the nursing notes.  Pertinent labs & imaging results that were available during my care of the patient were reviewed by me and considered in my medical decision making (see chart for details).    CRITICAL CARE Performed by: Jacalyn Lefevre   Total critical care time: 45  minutes  Critical care time was exclusive of separately billable procedures and treating other patients.  Critical care was necessary to treat or prevent imminent or life-threatening deterioration.  Critical care was time spent personally by me on the following activities: development of treatment plan with patient and/or surrogate as well as nursing, discussions with consultants, evaluation of patient's response to treatment, examination of patient, obtaining history from patient or surrogate, ordering and performing treatments and interventions, ordering and review of laboratory studies, ordering and review of radiographic studies, pulse oximetry and re-evaluation of patient's condition.  Pt looks tremendously better with bipap.  He was d/w triad hospitalists and will be admitted.  Cardiology will also be consulted.  Hospitalists also wanted me to speak with ccm, so I spoke with Dr. Jamison Neighbor.  They will see him.   Final Clinical Impressions(s) / ED Diagnoses   Final diagnoses:  Acute respiratory failure with hypoxia (HCC)  Acute on chronic combined systolic and diastolic congestive heart failure (HCC)  Hypertensive emergency  Acute renal failure superimposed on stage 4 chronic kidney disease, unspecified acute renal failure type [facility])    New Prescriptions New Prescriptions   No medications on file     Jacalyn Lefevre, MD 03/28/17 1452    Jacalyn Lefevre, MD 03/28/17 [phone]

## 2017-03-28 NOTE — Progress Notes (Signed)
CRITICAL VALUE ALERT  Critical Value:  Lactic 2.7  Date & Time Notied:  03/28/17  Provider Notified: Tyson Alias  Orders Received/Actions taken: Yes

## 2017-03-28 NOTE — Progress Notes (Signed)
ANTICOAGULATION CONSULT NOTE - Initial Consult  Pharmacy Consult for heparin Indication: atrial fibrillation/flutter  Allergies  Allergen Reactions  . Losartan Shortness Of Breath  . Elita Quick [Ceftazidime] Rash    Rash with blisters  . Lisinopril Rash    Patient Measurements:    Vital Signs: BP: 147/114 (08/21 1630) Pulse Rate: 113 (08/21 1630)  Labs:  Recent Labs  03/28/17 1137 03/28/17 1315  HGB 16.2  --   HCT 47.3  --   PLT 223  --   CREATININE  --  2.86*  TROPONINI 0.04*  --     Estimated Creatinine Clearance: 39 mL/min (A) (by C-G formula based on SCr of 2.86 mg/dL (H)).   Medical History: Past Medical History:  Diagnosis Date  . Anxiety   . CHF (congestive heart failure) (HCC)    Hattie Perch 03/23/2017  . CKD (chronic kidney disease), stage III    Hattie Perch 03/23/2017  . Complete heart block (HCC) 10/20/2015  . COPD (chronic obstructive pulmonary disease) (HCC)    pt denies this hx on 03/23/2017  . Coronary artery disease   . High cholesterol   . Hypertension   . Myocardial infarction [facility]) 10/2015   "massive one; saw scar tissue of possible previous MI maybe in 2013" (03/23/2017)  . Stenosis of right carotid artery   . TIA (transient ischemic attack) 2016    Assessment: 56 year old male being admitted for acute hypoxic respiratory failure, flash pulmonary edema. He was on apixaban prior to admit, orders to start heparin if unable to take po's. D/w ICU nurse and she mentioned he would likely need to go back on bipap so po medications may be challenging later, will go ahead and switch to heparin tonight. Last dose of apixaban was this morning.   Will monitor based on aptt's for now given recent apixaban use.  Goal of Therapy:  Heparin level 0.3-0.5 units/ml aPTT 66-102 seconds Monitor platelets by anticoagulation protocol: Yes   Plan:  Start heparin infusion at 1200 units/hr Recent apixaban use, will monitor aptt and heparin levels until they  correlate Continue to monitor H&H and platelets  Sheppard Coil PharmD., BCPS Clinical Pharmacist Pager [phone] 03/28/2017 6:11 PM

## 2017-03-28 NOTE — ED Triage Notes (Signed)
Pt brought down to ED from CHF clinic. Accompanied by cardiology and RN. Pt alert. Breathing labored. Diaphoretic. Pale and dusky. Pt moved from wheelchair to ED stretcher. EDP in for treatment. RT placed Bipap immediately.

## 2017-03-28 NOTE — ED Notes (Signed)
Attempted report 

## 2017-03-28 NOTE — Progress Notes (Signed)
Pt. Received on bipap from ER.  Removed and placed on HFNC 10lpm.  Saturations 91-93%.  Nose red at bridge.  Bipap on stand-by.

## 2017-03-28 NOTE — Progress Notes (Signed)
Patient was taken directly to ER with suspected flash pulmonary edema.  Please see my consult note in the ER from today.

## 2017-03-28 NOTE — Progress Notes (Signed)
Per CCM MD- d/c= hold off on ABG for now. Pt is awake, alert, answering questions (through bipap), VSS currently & no distress currently noted.  Per MD, may trial pt off bipap later today if able.

## 2017-03-28 NOTE — Progress Notes (Signed)
CRITICAL VALUE ALERT  Critical Value:  Lactic 2.7 and Troponin 0.38  Date & Time Notied: 03/28/17 @2112   Provider Notified: Tyson Alias MD  Orders Received/Actions taken: no new orders at this time

## 2017-03-28 NOTE — Progress Notes (Signed)
eLink Physician-Brief Progress Note Patient Name: Christopher [name] DOB: October 02, 1960 MRN: [id number]   Date of Service  03/28/2017  HPI/Events of Note  Slight rise trop, without any changes in clinical status just rise crt which accounts for this   Lactic down On NTG hep  eICU Interventions       Intervention Category Intermediate Interventions: Communication with other healthcare providers and/or family;Diagnostic test evaluation  Nelda Bucks. 03/28/2017, 9:26 PM

## 2017-03-28 NOTE — Progress Notes (Signed)
eLink Physician-Brief Progress Note Patient Name: Christopher [name] DOB: [birth year] MRN: [id number]   Date of Service  03/28/2017  HPI/Events of Note  pulm edeam likely, CT reviewed, pcxr reviewed Rule out pNA on top crt down Now off NIMV, appears well on camera without distress Lasix to neg balance goals remain bmet to follow  eICU Interventions       Intervention Category Evaluation Type: New Patient Evaluation  Nelda Bucks. 03/28/2017, 6:26 PM

## 2017-03-28 NOTE — ED Notes (Signed)
Dr. Celene Skeen at bedside at this time

## 2017-03-29 ENCOUNTER — Inpatient Hospital Stay (HOSPITAL_COMMUNITY): Payer: Medicaid Other

## 2017-03-29 DIAGNOSIS — I5043 Acute on chronic combined systolic (congestive) and diastolic (congestive) heart failure: Secondary | ICD-10-CM

## 2017-03-29 DIAGNOSIS — J810 Acute pulmonary edema: Secondary | ICD-10-CM

## 2017-03-29 DIAGNOSIS — I161 Hypertensive emergency: Secondary | ICD-10-CM

## 2017-03-29 DIAGNOSIS — I5023 Acute on chronic systolic (congestive) heart failure: Secondary | ICD-10-CM

## 2017-03-29 LAB — GLUCOSE, CAPILLARY
GLUCOSE-CAPILLARY: 141 mg/dL — AB (ref 65–99)
GLUCOSE-CAPILLARY: 162 mg/dL — AB (ref 65–99)
GLUCOSE-CAPILLARY: 165 mg/dL — AB (ref 65–99)
GLUCOSE-CAPILLARY: 162 mg/dL — AB (ref 65–99)
GLUCOSE-CAPILLARY: 211 mg/dL — AB (ref 65–99)
Glucose-Capillary: 153 mg/dL — ABNORMAL HIGH (ref 65–99)

## 2017-03-29 LAB — CBC
HCT: 38.7 % — ABNORMAL LOW (ref 39.0–52.0)
HEMOGLOBIN: 13 g/dL (ref 13.0–17.0)
MCH: 31.4 pg (ref 26.0–34.0)
MCHC: 33.6 g/dL (ref 30.0–36.0)
MCV: 93.5 fL (ref 78.0–100.0)
Platelets: 173 10*3/uL (ref 150–400)
RBC: 4.14 MIL/uL — AB (ref 4.22–5.81)
RDW: 16.3 % — ABNORMAL HIGH (ref 11.5–15.5)
WBC: 16.1 10*3/uL — ABNORMAL HIGH (ref 4.0–10.5)

## 2017-03-29 LAB — BASIC METABOLIC PANEL
Anion gap: 15 (ref 5–15)
BUN: 35 mg/dL — AB (ref 6–20)
CHLORIDE: 106 mmol/L (ref 101–111)
CO2: 18 mmol/L — ABNORMAL LOW (ref 22–32)
Calcium: 8.8 mg/dL — ABNORMAL LOW (ref 8.9–10.3)
Creatinine, Ser: 3.72 mg/dL — ABNORMAL HIGH (ref 0.61–1.24)
GFR calc Af Amer: 20 mL/min — ABNORMAL LOW (ref >60)
GFR calc non Af Amer: 17 mL/min — ABNORMAL LOW (ref >60)
GLUCOSE: 143 mg/dL — AB (ref 65–99)
POTASSIUM: 4.2 mmol/L (ref 3.5–5.1)
SODIUM: 139 mmol/L (ref 135–145)

## 2017-03-29 LAB — LEGIONELLA PNEUMOPHILA SEROGP 1 UR AG: L. PNEUMOPHILA SEROGP 1 UR AG: NEGATIVE

## 2017-03-29 LAB — PHOSPHORUS: PHOSPHORUS: 2.2 mg/dL — AB (ref 2.5–4.6)

## 2017-03-29 LAB — EXPECTORATED SPUTUM ASSESSMENT W REFEX TO RESP CULTURE: SPECIAL REQUESTS: NORMAL

## 2017-03-29 LAB — PROCALCITONIN: Procalcitonin: 16.42 ng/mL

## 2017-03-29 LAB — EXPECTORATED SPUTUM ASSESSMENT W GRAM STAIN, RFLX TO RESP C

## 2017-03-29 LAB — APTT: aPTT: 83 seconds — ABNORMAL HIGH (ref 24–36)

## 2017-03-29 LAB — MAGNESIUM: MAGNESIUM: 1.8 mg/dL (ref 1.7–2.4)

## 2017-03-29 LAB — TROPONIN I: TROPONIN I: 0.53 ng/mL — AB (ref <0.03)

## 2017-03-29 LAB — HEPARIN LEVEL (UNFRACTIONATED): Heparin Unfractionated: >2.2 IU/mL — ABNORMAL HIGH (ref 0.30–0.70)

## 2017-03-29 MED ORDER — CARVEDILOL 3.125 MG PO TABS
3.1250 mg | ORAL_TABLET | Freq: Two times a day (BID) | ORAL | Status: DC
  Administered 2017-03-29 – 2017-04-03 (×10): 3.125 mg via ORAL
  Filled 2017-03-29 (×12): qty 1

## 2017-03-29 MED ORDER — TORSEMIDE 20 MG PO TABS
40.0000 mg | ORAL_TABLET | Freq: Every day | ORAL | Status: DC
  Administered 2017-03-30: 40 mg via ORAL
  Filled 2017-03-29: qty 2

## 2017-03-29 MED ORDER — HYDRALAZINE HCL 50 MG PO TABS
50.0000 mg | ORAL_TABLET | Freq: Three times a day (TID) | ORAL | Status: DC
  Administered 2017-03-29 (×2): 50 mg via ORAL
  Filled 2017-03-29 (×4): qty 1

## 2017-03-29 MED ORDER — QUETIAPINE FUMARATE 25 MG PO TABS
25.0000 mg | ORAL_TABLET | Freq: Every day | ORAL | Status: DC
  Administered 2017-03-29 – 2017-04-02 (×5): 25 mg via ORAL
  Filled 2017-03-29 (×5): qty 1

## 2017-03-29 NOTE — Progress Notes (Signed)
Notified MD regarding pt with persistent nausea and vomiting, unrelieved by phenergan.  Order for NG to suction.  Patient refused to have NG tube placed and stated "I would rather vomit all day than have another one of those placed".

## 2017-03-29 NOTE — Progress Notes (Signed)
ANTICOAGULATION CONSULT NOTE - F/u Consult  Pharmacy Consult for heparin Indication: atrial fibrillation/flutter  Allergies  Allergen Reactions  . Losartan Shortness Of Breath  . Elita Quick [Ceftazidime] Rash    Rash with blisters  . Lisinopril Rash   Heparin dosing weight: 101 kg  Vital Signs: Temp: 99.5 F (37.5 C) (08/22 0415) Temp Source: Core (Comment) (08/22 0400) BP: 119/72 (08/22 0415) Pulse Rate: 98 (08/22 0415)  Labs:  Recent Labs  03/28/17 1137 03/28/17 1315 03/28/17 1956 03/29/17 0304  HGB 16.2  --   --  13.0  HCT 47.3  --   --  38.7*  PLT 223  --   --  173  APTT  --   --  33 83*  HEPARINUNFRC  --   --  >2.20* >2.20*  CREATININE  --  2.86* 3.38* 3.72*  TROPONINI 0.04*  --  0.38* 0.53*    Estimated Creatinine Clearance: 30 mL/min (A) (by C-G formula based on SCr of 3.72 mg/dL (H)).   Medical History: Past Medical History:  Diagnosis Date  . Anxiety   . CHF (congestive heart failure) (HCC)    Hattie Perch 03/23/2017  . CKD (chronic kidney disease), stage III    Hattie Perch 03/23/2017  . Complete heart block (HCC) 10/20/2015  . COPD (chronic obstructive pulmonary disease) (HCC)    pt denies this hx on 03/23/2017  . Coronary artery disease   . High cholesterol   . Hypertension   . Myocardial infarction [facility]) 10/2015   "massive one; saw scar tissue of possible previous MI maybe in 2013" (03/23/2017)  . Stenosis of right carotid artery   . TIA (transient ischemic attack) 2016    Assessment: 56 year old male being admitted for acute hypoxic respiratory failure and pulmonary edema. He was on apixaban prior to admit - transitioned to heparin d/t inability to take PO. Last dose of apixaban 8/21 at 0900.   aPTT therapeutic at 83s and heparin level supratherapetic due to influence of apixaban.   Will use aPTT for now given false elevation of heparin level from recent apixaban use. CBC stable and no overt s/s bleeding noted.   Goal of Therapy:  Heparin level 0.3-0.5  units/ml aPTT 66-102 seconds Monitor platelets by anticoagulation protocol: Yes   Plan:  Continue heparin gtt at 1200 units/hr Confirmatory aPTT in 8 hrs Daily heparin level, aPTT, and CBC  Monitor for s/s bleeding F/u long-term AC plan  York Cerise, PharmD Clinical Pharmacist 03/29/17 4:52 AM

## 2017-03-29 NOTE — Progress Notes (Signed)
eLink Physician-Brief Progress Note Patient Name: Christopher [name] DOB: 14-Feb-1961 MRN: [id number]   Date of Service  03/29/2017  HPI/Events of Note  Nausea and vomiting all day Phenergan did not help  eICU Interventions  Place NG tube to suction     Intervention Category Evaluation Type: Other  Erin Fulling 03/29/2017, 5:47 PM

## 2017-03-29 NOTE — Progress Notes (Signed)
Advanced Heart Failure Rounding Note   Subjective:    Admitted with acute hypoxic respiratory failure and flash pulmonary edema.   Now off bipap. On ngt drip 180 mcg. Procalcitonin 16. Blood CX - NTGD. On antibiotics. Creatinine trending up.   Feeling better. Denies SOB. Had some nausea over night.    Objective:   Weight Range: 273 lb 13 oz (124.2 kg) Body mass index is 38.73 kg/m.   Vital Signs:   Temp:  [97.5 F (36.4 C)-99.9 F (37.7 C)] 99.1 F (37.3 C) (08/22 0745) Pulse Rate:  [69-125] 104 (08/22 0745) Resp:  [14-42] 18 (08/22 0745) BP: (93-176)/(66-154) 125/87 (08/22 0745) SpO2:  [90 %-100 %] 96 % (08/22 0745) FiO2 (%):  [60 %-100 %] 97 % (08/22 0155) Weight:  [273 lb 13 oz (124.2 kg)] 273 lb 13 oz (124.2 kg) (08/22 0500)    Weight change: Filed Weights   03/29/17 0500  Weight: 273 lb 13 oz (124.2 kg)    Intake/Output:   Intake/Output Summary (Last 24 hours) at 03/29/17 0829 Last data filed at 03/29/17 0756  Gross per 24 hour  Intake          1317.92 ml  Output              920 ml  Net           397.92 ml      Physical Exam    General:  Chronically ill. Pale. No resp difficulty HEENT: Normal Neck: Supple. JVP hard to assess due to body habitus.  . Carotids 2+ bilat; no bruits. No lymphadenopathy or thyromegaly appreciated. Cor: PMI nondisplaced. Irregular rate & rhythm. No rubs, gallops or murmurs. Lungs: Decreased on 6 liters.  Abdomen: Soft, nontender,distended. No hepatosplenomegaly. No bruits or masses. Good bowel sounds. Extremities: No cyanosis, clubbing, rash, edema Neuro: Alert & orientedx3, cranial nerves grossly intact. moves all 4 extremities w/o difficulty. Affect pleasant   Telemetry  A fib 80s-90s. Personally reviewed.   EKG     A flutter 112 bpm EKG in ED   Labs    CBC  Recent Labs  03/28/17 1137 03/29/17 0304  WBC 15.3* 16.1*  NEUTROABS 9.9*  --   HGB 16.2 13.0  HCT 47.3 38.7*  MCV 95.7 93.5  PLT 223 173    Basic Metabolic Panel  Recent Labs  03/28/17 1956 03/29/17 0304  NA 139 139  K 5.0 4.2  CL 107 106  CO2 18* 18*  GLUCOSE 124* 143*  BUN 28* 35*  CREATININE 3.38* 3.72*  CALCIUM 9.1 8.8*  MG  --  1.8  PHOS  --  2.2*   Liver Function Tests  Recent Labs  03/28/17 1315  AST 33  ALT 23  ALKPHOS 105  BILITOT 1.2  PROT 7.8  ALBUMIN 4.3   No results for input(s): LIPASE, AMYLASE in the last 72 hours. Cardiac Enzymes  Recent Labs  03/28/17 1137 03/28/17 1956 03/29/17 0304  TROPONINI 0.04* 0.38* 0.53*    BNP: BNP (last 3 results)  Recent Labs  12/06/16 1429 02/06/17 1136 03/28/17 1137  BNP 309.3* 330.6* 1,836.9*    ProBNP (last 3 results) No results for input(s): PROBNP in the last 8760 hours.   D-Dimer No results for input(s): DDIMER in the last 72 hours. Hemoglobin A1C  Recent Labs  03/28/17 1646  HGBA1C 7.4*   Fasting Lipid Panel No results for input(s): CHOL, HDL, LDLCALC, TRIG, CHOLHDL, LDLDIRECT in the last 72 hours. Thyroid Function Tests No  results for input(s): TSH, T4TOTAL, T3FREE, THYROIDAB in the last 72 hours.  Invalid input(s): FREET3  Other results:   Imaging    Ct Chest Wo Contrast  Result Date: 03/28/2017 CLINICAL DATA:  Shortness of breath. EXAM: CT CHEST WITHOUT CONTRAST TECHNIQUE: Multidetector CT imaging of the chest was performed following the standard protocol without IV contrast. COMPARISON:  Radiograph of same day. FINDINGS: Cardiovascular: Atherosclerosis of thoracic aorta is noted without aneurysm formation. Status post coronary artery bypass graft. No pericardial effusion is noted. Mediastinum/Nodes: No enlarged mediastinal or axillary lymph nodes. Thyroid gland, trachea, and esophagus demonstrate no significant findings. Lungs/Pleura: No pneumothorax or significant pleural effusion is noted. Bilateral posterior basilar opacities are noted, right greater than left, concerning for pneumonia or possibly edema.  Multifocal airspace opacities are noted in right upper lobe concerning for pneumonia or possibly edema. Upper Abdomen: No acute abnormality. Musculoskeletal: No chest wall mass or suspicious bone lesions identified. IMPRESSION: Bilateral posterior basilar opacities are noted, right greater than left, concerning for pneumonia or possibly edema. Multifocal airspace opacities are noted in the right upper lobe is well concerning for pneumonia or possibly edema. Aortic Atherosclerosis (ICD10-I70.0). Electronically Signed   By: Lupita Raider, M.D.   On: 03/28/2017 17:45   Dg Chest Port 1 View  Result Date: 03/29/2017 CLINICAL DATA:  Respiratory failure . EXAM: PORTABLE CHEST 1 VIEW COMPARISON:  CT 03/28/2017.  Chest x-ray 03/28/2017, 03/17/2017. FINDINGS: Prior CABG. Cardiomegaly. Persistent but improved bilateral pulmonary infiltrates/ edema. No prominent pleural effusion. No pneumothorax. IMPRESSION: Prior CABG. Cardiomegaly. Persistent but improved bilateral pulmonary infiltrates/edema. Findings suggest improving congestive heart failure. Electronically Signed   By: Maisie Fus  Register   On: 03/29/2017 06:57   Dg Chest Port 1 View  Result Date: 03/28/2017 CLINICAL DATA:  Severe shortness of breath began today. History of CHF, COPD, former smoker. EXAM: PORTABLE CHEST 1 VIEW COMPARISON:  Chest x-ray of March 17, 2017 FINDINGS: The lungs are adequately inflated. The interstitial markings are increased. The pulmonary vascularity is engorged. The cardiac silhouette is enlarged. The patient has undergone previous CABG. No pleural effusion is observed. IMPRESSION: CHF with pulmonary interstitial edema. The appearance of the chest has deteriorated since the study of 11 days ago. Electronically Signed   By: David  [place] M.D.   On: 03/28/2017 11:54      Medications:     Scheduled Medications: . DULoxetine  30 mg Oral Daily  . famotidine  20 mg Oral Daily  . furosemide  80 mg Intravenous BID  . gabapentin   300 mg Oral BID  . hydrALAZINE  10 mg Intravenous Q4H  . hydrALAZINE  25 mg Oral Q8H  . insulin aspart  0-9 Units Subcutaneous Q4H  . ipratropium-albuterol  3 mL Nebulization Q6H  . ondansetron (ZOFRAN) IV  4 mg Intravenous Once  . rosuvastatin  40 mg Oral Daily     Infusions: . sodium chloride    . aztreonam Stopped (03/29/17 0042)  . heparin 1,200 Units/hr (03/29/17 0700)  . levofloxacin (LEVAQUIN) IV Stopped (03/28/17 2219)  . nitroGLYCERIN 180 mcg/min (03/29/17 0745)  . vancomycin       PRN Medications:  sodium chloride, albuterol, hydrALAZINE, ipratropium-albuterol, promethazine    Patient Profile   Mr Dicostanzo is 56 year old with a history of CAD, CABG x4 and R CEA 2017, ICM, chronic systolic heart failure, and A flutter admitted with acute respiratory failure and a/c systolic heart failure.   Assessment/Plan   1. Acute hypoxemic respiratory failure: Suspect  flash pulmonary edema in setting of hypertensive emergency.  He has been off Entresto and spironolactone and on lower dose of Coreg since recent admission with AKI.  He has also been on less torsemide.  CXR with CHF, volume overloaded on exam, BNP up.  -Start to wean ntg drip. Volume status improving. CXR  improving.   Off Bipap and on 6 liters oxygen.  2. CAD: s/p CABG. Presented with dyspnea rather than chest pain.  TnI 0.04 is likely demand ischemia with volume overload. No CP -Troponin 0.04> 0.38>0.53  - Continue Crestor.  - has not been on ASA given apixaban use.  3. Atrial flutter: Persistent atypical flutter.  He has seen EP and they are deciding +/- ablation.   Controlled rate.   - Off apixaban.  On heparin. Last week while hospitalized he missed dose of apixaban.  - Set up TEE/DC-CV once more stable.  4. Acute on chronic systolic CHF: Ischemic cardiomyopathy. Echo in 3/17 with EF 30-35%, improved to 40-45% on echo in 7/18 with mildly dilated RV.  Entresto, spironolactone on hold and on lower doses of  Lasix and Coreg in setting of AKI.  On admit had flash pulmonary edema and hypertensive emergency. CXR improving.  Volume status improving.   - BP better. Start to wean ntg drip. Increase hydralazine to 50 mg three times a day.   - Continue to hold Entresto and spironolactone with creatinine 3.72  - Renal function getting worse. Stop IV lasix.  Start 40 mg torsemide in am.  - Add 3.125 mg coreg twice a day.   5. AKI on CKD stage 3: Recent rise in creatinine to 5, meds held.  Creatinine up to 2.8>3.7. BMET in am.  Keep off spiro and entresto.    6. Carotid stenosis: Right CEA in 2017. Followed by [facility].  7. PAD: Extensive on last peripheral dopplers, claudication has worsened in the last few months and is now limiting. Needs followup with Dr. Arbie Cookey as outpatient.  8. ID: Possible Pneumonia. On antibiotics- vanc/levaquin/axteronam.  WBC 16. Procalcitonin 16.4. Blood CX- NGTD   Length of Stay: 1   Amy Clegg, NP  03/29/2017, 8:29 AM  Advanced Heart Failure Team Pager [phone] (M-F; 7a - 4p)  Please contact [facility] for night-coverage after hours (4p -7a ) and weekends on [email or web address]  Patient seen with NP, agree with the above note.  Based on chest CT and procalcitonin 16, suspect he has PNA.  He is on broad spectrum antibiotics.  Possible mixed presentation with PNA and then developed hypertensive emergency with flash pulmonary edema.  Today, CXR improved.  Creatinine up to 3.72.   - Stop IV Lasix, follow creatinine and possibly start torsemide tomorrow.  - Increase hydralazine to 50 mg tid for BP control, wean NTG gtt and eventually start Imdur.   - Can go back on Coreg 3.125 mg bid.   Mild increase in troponin is likely demand ischemia.   He remains in atrial flutter, currently on heparin gtt.  Eventually will need TEE-guided DCCV. EP contemplating ablation.   [name] 03/29/2017 12:41 PM

## 2017-03-29 NOTE — Progress Notes (Signed)
PULMONARY / CRITICAL CARE MEDICINE   Name: Christopher [name] MRN: [id number] DOB: [birth year]    ADMISSION DATE:  03/28/2017 CONSULTATION DATE:  03/28/2017  REFERRING MD:  Jacalyn Lefevre, M.D. / EDP  CHIEF COMPLAINT:  Acute Hypoxic Respiratory Failure   HISTORY OF PRESENT ILLNESS:  56 y.o. male with known history of combined systolic and diastolic congestive heart failure. Recently admitted with acute renal failure. Patient was instructed to begin Demadex on 8/19. Aldactone and Entresto held following admission. Per documentation patient follows with Dr. Shirlee Latch for his heart failure. He also has a known history of coronary artery disease post bypass. Patient seen on 8/10 for nausea, palpitations, and poor by mouth intake in the emergency department. Initially thought to have a viral upper respiratory infection and possible withdrawal from gabapentin which she had stopped abruptly the week prior. Patient was noted to have a acute renal failure and IV fluid bolus was administered. Patient's creatinine continued to rise despite IV fluid hydration. Nephrology and cardiology were consulted during the admission. Cardiology planned to restart lower dosed torsemide on 8/19 as well as low-dose Coreg twice daily if his blood pressure tolerated. On the day of discharge the patient's blood pressure was 100/61 and he was saturating 98% with a heart rate of 55 bpm. patient reports his dyspnea started abruptly today. Does endorse some subjective fever as well as hot and cold chills. No audible wheezing per patient and family at bedside but he has had a mild nonproductive cough. No chest pain, pressure, or tightness. Denies any sore throat or sinus congestion. No abdominal pain, nausea, or vomiting. He has had some mild diarrhea. Patient reports he has been taking his Coreg and diuretics as prescribed. He has also been taking his systemic anticoagulation. In the emergency department he was started on a nitroglycerin drip  for his hypertension and given 2 doses of Lasix 80 mg IV. Patient has had minimal urine output.  SUBJECTIVE: No events overnight  VITAL SIGNS: BP (!) 122/94   Pulse (!) 101   Temp 99.3 F (37.4 C)   Resp 12   Wt 124.2 kg (273 lb 13 oz)   SpO2 96%   BMI 38.73 kg/m   HEMODYNAMICS:    VENTILATOR SETTINGS: Vent Mode: BIPAP;PSV FiO2 (%):  [60 %-97 %] 97 % PEEP:  [5 cmH20] 5 cmH20 Pressure Support:  [8 cmH20] 8 cmH20  INTAKE / OUTPUT: I/O last 3 completed shifts: In: 1317.9 [I.V.:567.9; IV Piggyback:750] Out: 745 [Urine:745]  PHYSICAL EXAMINATION: General:  Awake. Alert. No acute distress. Family at bedside.  Integument:  Warm & dry. No rash on exposed skin.  Extremities:  No cyanosis or clubbing.  Lymphatics:  No appreciated cervical or supraclavicular lymphadenoapthy. HEENT:  Moist mucus membranes. No scleral injection or icterus. BiPAP mask in place. Cardiovascular:  Mildly tachycardic with irregular rhythm. No edema. Unable to appreciate JVD.  Pulmonary:  Coarse, rhonchorous breath sounds bilaterally. Mildly increased work of breathing on BiPAP. Abdomen: Soft. Normal bowel sounds. Protuberant. Grossly nontender. Musculoskeletal:  Normal bulk and tone. No joint deformity or effusion appreciated. Neurological:  Symmetric face. No meningismus. Moving all 4 extremities equally.  Psychiatric:  Mood and affect congruent. Speech somewhat pressured on BiPAP.  LABS:  BMET  Recent Labs Lab 03/28/17 1315 03/28/17 1956 03/29/17 0304  NA 139 139 139  K 5.3* 5.0 4.2  CL 106 107 106  CO2 16* 18* 18*  BUN 24* 28* 35*  CREATININE 2.86* 3.38* 3.72*  GLUCOSE 296* 124* 143*  Electrolytes  Recent Labs Lab 03/28/17 1315 03/28/17 1956 03/29/17 0304  CALCIUM 9.3 9.1 8.8*  MG  --   --  1.8  PHOS  --   --  2.2*    CBC  Recent Labs Lab 03/24/17 0339 03/28/17 1137 03/29/17 0304  WBC 6.1 15.3* 16.1*  HGB 12.3* 16.2 13.0  HCT 37.0* 47.3 38.7*  PLT 143* 223 173     Coag's  Recent Labs Lab 03/28/17 1956 03/29/17 0304  APTT 33 83*    Sepsis Markers  Recent Labs Lab 03/28/17 1646 03/28/17 2008 03/29/17 0304  LATICACIDVEN 2.7* 2.2*  --   PROCALCITON 1.98  --  16.42    ABG No results for input(s): PHART, PCO2ART, PO2ART in the last 168 hours.  Liver Enzymes  Recent Labs Lab 03/23/17 1405 03/28/17 1315  AST 21 33  ALT 24 23  ALKPHOS 79 105  BILITOT 1.0 1.2  ALBUMIN 4.2 4.3    Cardiac Enzymes  Recent Labs Lab 03/28/17 1137 03/28/17 1956 03/29/17 0304  TROPONINI 0.04* 0.38* 0.53*    Glucose  Recent Labs Lab 03/28/17 1741 03/28/17 2034 03/28/17 2356 03/29/17 0309 03/29/17 0856 03/29/17 1115  GLUCAP 110* 127* 149* 141* 162* 165*    Imaging Ct Chest Wo Contrast  Result Date: 03/28/2017 CLINICAL DATA:  Shortness of breath. EXAM: CT CHEST WITHOUT CONTRAST TECHNIQUE: Multidetector CT imaging of the chest was performed following the standard protocol without IV contrast. COMPARISON:  Radiograph of same day. FINDINGS: Cardiovascular: Atherosclerosis of thoracic aorta is noted without aneurysm formation. Status post coronary artery bypass graft. No pericardial effusion is noted. Mediastinum/Nodes: No enlarged mediastinal or axillary lymph nodes. Thyroid gland, trachea, and esophagus demonstrate no significant findings. Lungs/Pleura: No pneumothorax or significant pleural effusion is noted. Bilateral posterior basilar opacities are noted, right greater than left, concerning for pneumonia or possibly edema. Multifocal airspace opacities are noted in right upper lobe concerning for pneumonia or possibly edema. Upper Abdomen: No acute abnormality. Musculoskeletal: No chest wall mass or suspicious bone lesions identified. IMPRESSION: Bilateral posterior basilar opacities are noted, right greater than left, concerning for pneumonia or possibly edema. Multifocal airspace opacities are noted in the right upper lobe is well concerning  for pneumonia or possibly edema. Aortic Atherosclerosis (ICD10-I70.0). Electronically Signed   By: Lupita Raider, M.D.   On: 03/28/2017 17:45   Dg Chest Port 1 View  Result Date: 03/29/2017 CLINICAL DATA:  Respiratory failure . EXAM: PORTABLE CHEST 1 VIEW COMPARISON:  CT 03/28/2017.  Chest x-ray 03/28/2017, 03/17/2017. FINDINGS: Prior CABG. Cardiomegaly. Persistent but improved bilateral pulmonary infiltrates/ edema. No prominent pleural effusion. No pneumothorax. IMPRESSION: Prior CABG. Cardiomegaly. Persistent but improved bilateral pulmonary infiltrates/edema. Findings suggest improving congestive heart failure. Electronically Signed   By: Maisie Fus  Register   On: 03/29/2017 06:57    STUDIES:  PFT 10/26/15: FVC 3.46 L (68%] the FEV1 2.59 L (66%) FEV1/FVC 0.75 FEF 25-75 1.91 L (57%) negative bronchodilator response RHC 11/02/15:  RA mean: 19  RV: 44/23  PA: 43/25 (33)  Pulse Later wedge pressure mean: 26  Aortic pressure: 124/71  PA saturation: 64%  Aortic saturation: 100% RENAL U/S 8/16:  Negative renal ultrasound. TTE 03/02/17:  LV normal in size with moderate focal basal hypertrophy. EF 40-45% with diffuse hypokinesis & akinesis of the apical myocardium. Grade 2 diastolic dysfunction. LA & RA normal in size. RV mildly dilated with preserved systolic function. No aortic stenosis or regurgitation. Aortic root mildly dilated. No mitral stenosis or regurgitation. No pulmonic  stenosis or regurgitation. Mild tricuspid regurgitation. No pericardial effusion. PORT CXR 8/21:  Personally reviewed by me. Compared with previous x-ray from 8/10. Patient demonstrates significant increase in bilateral interstitial markings consistent with pulmonary edema as well as silhouetting of bilateral hemidiaphragms with lower lung hazy opacification consistent with pleural effusions. No consolidation appreciated or air bronchograms. Mediastinum otherwise normal in  contour.  MICROBIOLOGY: None.  ANTIBIOTICS: None.  SIGNIFICANT EVENTS: 8/17 - Discharge from hospital after admission with acute renal failure >> diuretics held  8/21 - Presented from heart failure clinic with hypoxic respiratory failure  LINES/TUBES: FOLEY 8/21 >>> PIV  I reviewed CXR myself, pulmonary edema noted  ASSESSMENT / PLAN:  PULMONARY A: Acute hypoxic respiratory failure: Pulmonary edema versus healthcare associated pneumonia. Possible healthcare associated pneumonia COPD: No signs of acute exacerbation.  P:   Continuing to wean FiO2 Diureses per cardiology D/C BiPAP Titrate O2 for sat of 88-92% Continuous pulse oximetry monitoring Continuing DuoNeb when necessary for any coughing or shortness of breath  CARDIOVASCULAR A:  Hypertensive emergency: Likely secondary to withholding medications. Possible acute on chronic diastolic & systolic congestive heart failure  History of atrial fibrillation/flutter History of coronary artery disease History of essential hypertension History of hyperlipidemia History of stenosis of right carotid artery  P:  Per cards D/C NTG drip Hydralazine IV when necessary to maintain BP parameters Monitor vitals per protocol Continuous telemetry monitoring Continuing home Coreg & Crestor CHF team managing diureses  RENAL A:   Acute on chronic renal failure stage III: Improving as diuretics were withheld outpatient. Metabolic acidosis: Mild. Hyperkalemia: Mild.  P:   Trending urine output with Foley catheter Monitor electrolytes and renal function daily Checking/trending lactic acid Replace electrolytes as indicated  GASTROINTESTINAL A:   No acute issues.  P:   Nothing by mouth except for medicines with sips Pepcid 20 mg by mouth twice a day in place of Zantac  HEMATOLOGIC A:   Leukocytosis: Possibly secondary to sepsis. Coagulopathy: Secondary to chronic anticoagulation with Eliquis.  P:  Trending cell  counts daily with CBC Holding Eliquis SCDs for DVT prophylaxis  INFECTIOUS A:   Possible sepsis Possible healthcare associated pneumonia  P:   Procalcitonin 16.42, will continue broad spectrum abx. Checking stat blood cultures, urine Legionella antigen, and urine streptococcal antigen Low threshold to de-escalate antibiotic therapy  ENDOCRINE A:   Hyperglycemia: No history of diabetes mellitus.  P:   Checking hemoglobin A1c Accu-Cheks every 4 hours while nothing by mouth Sliding-scale insulin per sensitive algorithm  NEUROLOGIC A:   No acute issues.  History of anxiety History of TIA  P:   Continuing home Neurontin & Cymbalta Holding Seroquel Continuing Crestor  FAMILY  - Updates:  Will transfer to SDU and to [facility] service with PCCM off 8/23.  Discussed with [facility]-MD and PCCM-NP.  - Inter-disciplinary family meet or Palliative Care meeting due by:  8/28  Alyson Reedy, M.D. [facility] Pulmonary/Critical Care Medicine. Pager: [phone]. After hours pager: [phone].  03/29/2017, 2:39 PM

## 2017-03-29 NOTE — Progress Notes (Signed)
Attempted report 

## 2017-03-30 DIAGNOSIS — N183 Chronic kidney disease, stage 3 (moderate): Secondary | ICD-10-CM

## 2017-03-30 DIAGNOSIS — J441 Chronic obstructive pulmonary disease with (acute) exacerbation: Secondary | ICD-10-CM

## 2017-03-30 DIAGNOSIS — I483 Typical atrial flutter: Secondary | ICD-10-CM

## 2017-03-30 DIAGNOSIS — I10 Essential (primary) hypertension: Secondary | ICD-10-CM

## 2017-03-30 DIAGNOSIS — J189 Pneumonia, unspecified organism: Secondary | ICD-10-CM

## 2017-03-30 DIAGNOSIS — I5022 Chronic systolic (congestive) heart failure: Secondary | ICD-10-CM

## 2017-03-30 LAB — CBC
HEMATOCRIT: 36.7 % — AB (ref 39.0–52.0)
HEMOGLOBIN: 12.3 g/dL — AB (ref 13.0–17.0)
MCH: 31.7 pg (ref 26.0–34.0)
MCHC: 33.5 g/dL (ref 30.0–36.0)
MCV: 94.6 fL (ref 78.0–100.0)
Platelets: 151 10*3/uL (ref 150–400)
RBC: 3.88 MIL/uL — AB (ref 4.22–5.81)
RDW: 17.2 % — ABNORMAL HIGH (ref 11.5–15.5)
WBC: 12 10*3/uL — ABNORMAL HIGH (ref 4.0–10.5)

## 2017-03-30 LAB — GLUCOSE, CAPILLARY
GLUCOSE-CAPILLARY: 170 mg/dL — AB (ref 65–99)
GLUCOSE-CAPILLARY: 127 mg/dL — AB (ref 65–99)
GLUCOSE-CAPILLARY: 138 mg/dL — AB (ref 65–99)
Glucose-Capillary: 143 mg/dL — ABNORMAL HIGH (ref 65–99)
Glucose-Capillary: 127 mg/dL — ABNORMAL HIGH (ref 65–99)

## 2017-03-30 LAB — APTT
APTT: 60 s — AB (ref 24–36)
APTT: 92 s — AB (ref 24–36)

## 2017-03-30 LAB — BASIC METABOLIC PANEL
ANION GAP: 13 (ref 5–15)
BUN: 49 mg/dL — ABNORMAL HIGH (ref 6–20)
CALCIUM: 8.5 mg/dL — AB (ref 8.9–10.3)
CO2: 22 mmol/L (ref 22–32)
Chloride: 103 mmol/L (ref 101–111)
Creatinine, Ser: 3.87 mg/dL — ABNORMAL HIGH (ref 0.61–1.24)
GFR, EST AFRICAN AMERICAN: 19 mL/min — AB (ref >60)
GFR, EST NON AFRICAN AMERICAN: 16 mL/min — AB (ref >60)
GLUCOSE: 130 mg/dL — AB (ref 65–99)
POTASSIUM: 3.3 mmol/L — AB (ref 3.5–5.1)
Sodium: 138 mmol/L (ref 135–145)

## 2017-03-30 LAB — PROCALCITONIN: PROCALCITONIN: 8.95 ng/mL

## 2017-03-30 LAB — HEPARIN LEVEL (UNFRACTIONATED): Heparin Unfractionated: >2.2 IU/mL — ABNORMAL HIGH (ref 0.30–0.70)

## 2017-03-30 LAB — PHOSPHORUS: Phosphorus: 2.2 mg/dL — ABNORMAL LOW (ref 2.5–4.6)

## 2017-03-30 LAB — MAGNESIUM: MAGNESIUM: 1.8 mg/dL (ref 1.7–2.4)

## 2017-03-30 MED ORDER — MAGNESIUM SULFATE IN D5W 1-5 GM/100ML-% IV SOLN
1.0000 g | Freq: Once | INTRAVENOUS | Status: AC
  Administered 2017-03-30: 1 g via INTRAVENOUS
  Filled 2017-03-30: qty 100

## 2017-03-30 MED ORDER — ISOSORBIDE MONONITRATE ER 30 MG PO TB24
30.0000 mg | ORAL_TABLET | Freq: Every day | ORAL | Status: DC
  Administered 2017-03-30 – 2017-04-03 (×5): 30 mg via ORAL
  Filled 2017-03-30 (×5): qty 1

## 2017-03-30 MED ORDER — MAGNESIUM SULFATE 50 % IJ SOLN: 1.0000 g | Freq: Once | INTRAMUSCULAR | Status: DC

## 2017-03-30 MED ORDER — LEVOFLOXACIN IN D5W 500 MG/100ML IV SOLN: 500.0000 mg | INTRAVENOUS | Status: DC

## 2017-03-30 MED ORDER — HYDRALAZINE HCL 25 MG PO TABS
25.0000 mg | ORAL_TABLET | Freq: Three times a day (TID) | ORAL | Status: DC
  Administered 2017-03-30 – 2017-04-03 (×12): 25 mg via ORAL
  Filled 2017-03-30 (×13): qty 1

## 2017-03-30 MED ORDER — POTASSIUM CHLORIDE CRYS ER 20 MEQ PO TBCR
50.0000 meq | EXTENDED_RELEASE_TABLET | Freq: Once | ORAL | Status: AC
  Administered 2017-03-30: 50 meq via ORAL
  Filled 2017-03-30: qty 1

## 2017-03-30 MED ORDER — DEXTROSE 5 % IV SOLN
1.0000 g | Freq: Three times a day (TID) | INTRAVENOUS | Status: DC
  Administered 2017-03-30 – 2017-04-03 (×12): 1 g via INTRAVENOUS
  Filled 2017-03-30 (×16): qty 1

## 2017-03-30 MED ORDER — POTASSIUM CHLORIDE 20 MEQ/15ML (10%) PO SOLN
50.0000 meq | Freq: Once | ORAL | Status: DC
Start: 2017-03-30 — End: 2017-03-30
  Filled 2017-03-30: qty 45

## 2017-03-30 MED ORDER — VANCOMYCIN HCL 10 G IV SOLR
1500.0000 mg | INTRAVENOUS | Status: DC
  Administered 2017-03-31: 1500 mg via INTRAVENOUS
  Filled 2017-03-30: qty 1500

## 2017-03-30 NOTE — Progress Notes (Signed)
Pharmacy Antibiotic Note  ASHA SEARS is a 56 y.o. male admitted on 03/28/2017 with pneumonia.  Pharmacy has been consulted for Aztreonam, Levaquin and Vancomycin dosing.   Continues on abx for possible sepsis / HCAP. WBC down to 12. PCT peaked at 16.42, now down to 8.95. SCr continues to trend up.  Plan: Change aztreonam to 1g IV Q8h Change Levaquin to 500mg  IV Q48h Change vancomycin to 1500mg  IV Q48h Monitor clinical picture, renal function, VT prn F/U C&S, abx deescalation / LOT  Height: 5\' 10"  (177.8 cm) Weight: 281 lb 8.4 oz (127.7 kg) IBW/kg (Calculated) : 73  Temp (24hrs), Avg:99.4 F (37.4 C), Min:97.4 F (36.3 C), Max:100.2 F (37.9 C)   Recent Labs Lab 03/23/17 1405 03/24/17 0339 03/28/17 1137 03/28/17 1315 03/28/17 1646 03/28/17 1956 03/28/17 2008 03/29/17 0304 03/30/17 0343  WBC 6.8 6.1 15.3*  --   --   --   --  16.1* 12.0*  CREATININE 5.05* 3.65*  --  2.86*  --  3.38*  --  3.72* 3.87*  LATICACIDVEN  --   --   --   --  2.7*  --  2.2*  --   --     Estimated Creatinine Clearance: 28.9 mL/min (A) (by C-G formula based on SCr of 3.87 mg/dL (H)).    Allergies  Allergen Reactions  . Losartan Shortness Of Breath  . Elita Quick [Ceftazidime] Rash    Rash with blisters  . Lisinopril Rash    Antimicrobials this admission: Vanc 8/21 >>  Aztreonam 8/21 >>  Levaquin 8/21 >>   Dose adjustments this admission: None   Microbiology results: 8/21 BCx: ngtd 8/21 S.pneumo antigen: negative 8/21 legionella: sent 8/21 MRSA PCR: negative 8/22 Resp Cx: Not acceptable for testing  Thank you for allowing pharmacy to be a part of this patient's care.  Enzo Bi, PharmD, BCPS Clinical Pharmacist Pager [phone] 03/30/2017 7:26 AM

## 2017-03-30 NOTE — Care Management Note (Signed)
Case Management Note  Patient Details  Name: RIGGEN RABA MRN: [id number] Date of Birth: 1961/02/28  Subjective/Objective:   From home with girl friend , Renea Ee, pta indep, has PCP and medication coverage thru Medicaid. Presented with hypoxic resp failure and flash pulmonary edema.  Yesterday NTG drip weaned off, hydralazine increased and coreg added per MD , he had lots of n/v, and hypotensive , transferred to SDU.                  Action/Plan:  NCM will follow for dc needs.   Expected Discharge Date:                  Expected Discharge Plan:  Home/Self Care  In-House Referral:     Discharge planning Services  CM Consult  Post Acute Care Choice:    Choice offered to:     DME Arranged:    DME Agency:     HH Arranged:    HH Agency:     Status of Service:  In process, will continue to follow  If discussed at Long Length of Stay Meetings, dates discussed:    Additional Comments:  Leone Haven, RN 03/30/2017, 4:07 PM

## 2017-03-30 NOTE — Progress Notes (Signed)
PROGRESS NOTE    Christopher [name]  ZOX:[id number] DOB: Jul 22, 1961 DOA: 03/28/2017 PCP: Shirlean Mylar, MD   Brief Narrative:  56 year old WM PMHx HTN, Chronic ombined Systolic and Diastolic CHF (EF 81-19%) and follows Dr. Shirlee Latch, HTN, CAD S/P CABG 4, TIA, COPD, Neuropathy on high-dose Neurontin, was recently seen in the ED on 8/10 for feeling 'terrible', nauseous, palpitations and poor by mouth intake. Patient was noted to have a acute renal failure and IV fluid bolus was administered. Patient's creatinine continued to rise despite IV fluid hydration. Nephrology and cardiology were consulted during the admission. Cardiology planned to restart lower dosed torsemide on 8/19 as well as low-dose Coreg twice daily if his blood pressure tolerated.   On the day of discharge (8/17) the patient's blood pressure was 100/61 and he was saturating 98% with a heart rate of 55 bpm. patient reports his dyspnea started abruptly today. Does endorse some subjective fever as well as hot and cold chills. No audible wheezing per patient and family at bedside but he has had a mild nonproductive cough. No chest pain, pressure, or tightness. Denies any sore throat or sinus congestion. No abdominal pain, nausea, or vomiting. He has had some mild diarrhea. Patient reports he has been taking his Coreg and diuretics as prescribed. He has also been taking his systemic anticoagulation. In the emergency department he was started on a nitroglycerin drip for his hypertension and given 2 doses of Lasix 80 mg IV. Patient has had minimal urine output.   Subjective: 8/23 A/O 4, negative CP, positive SOB. States weighs himself daily usually 275 pounds (124.7 kg). Not on home O2.   Assessment & Plan:   Active Problems:   Acute respiratory failure with hypoxia (HCC)   Acute pulmonary edema (HCC)   Hypertensive emergency   Acute Respiratory Failure with Hypoxia/HCAP Continuing to wean FiO2  D/C BiPAP Titrate O2 for sat of  88-92% Continuous pulse oximetry monitoring Continuing DuoNeb when necessary for any coughing or shortness of breath  Acute on CKD stage 3 (Baseline appears to be 1.7-2.0)  Recent Labs Lab 03/24/17 0339 03/28/17 1315 03/28/17 1956 03/29/17 0304 03/30/17 0343  CREATININE 3.65* 2.86* 3.38* 3.72* 3.87*  -Per previous admission note believed possible overmedication cause of acute renal failure (Neurontin, Cymbalta, tramadol, Aldactone, Torsemide, Entresto), -Patient discharged on 8/17 had been seen by Dr. Hyman Hopes nephrology during that visit. Per his note was awaiting serology (HIV, SPEP/UPEP). -Renal failure worsening since admission - Placed on gentle hydration, obtain renal ultrasound - UA showed hyaline casts, obtain urine eosinophils, urine sodium, urine creatinine, SPEP   Chronic systolic CHF (congestive heart failure) (JYN)@@@@ - Hold and Restoril, Aldactone, torsemide, placed on gentle hydration - Cardiology consulted, will notify Dr Shirlee Latch, sent message to Salley Hews via inbox  -Strict in and out since admission +1.2 L -Daily weight Filed Weights   03/29/17 0500 03/30/17 0426  Weight: 273 lb 13 oz (124.2 kg) 281 lb 8.4 oz (127.7 kg)  -Coreg 3.125 mg BID -Hydralazine 25 mg TID -Imdur 30 mg daily -Diureses per cardiology  Atrial flutter - Currently has soft BP and bradycardia, hence BB held but will restart as BP improved  - given Cr 5.0 and he has taken all his cardiac meds today, I will await cardiology recommendations regarding anticoagulation with NOAC in AKI.   Essential hypertension - Currently BP on the softer side, 106/67, hold all cardiac medications including beta blocker, heart rate 54   COPD - Currently stable, no wheezing  CAD involving native coronary artery of native heart without angina pectoris - Currently stable, no chest pain, hold all cardiac medications   Hypokalemia@@@ -Potassium goal> 4 -Potassium 50 mEq  Hypomagnesemia@@@ -Magnesium  goal> 2 -Magnesium IV 1 g        DVT prophylaxis: Heparin drip Code Status: Full Family Communication: Wife at bedside Disposition Plan: TBD   Consultants:  CHF team    Procedures/Significant Events:  10/26/15 PFT: FVC 3.46 L (68%] the FEV1 2.59 L (66%) FEV1/FVC 0.75 FEF 25-75 1.91 L (57%) negative bronchodilator response 11/02/15 RHC:  RA mean: 19  RV: 44/23  PA: 43/25 (33)  Pulse Later wedge pressure mean: 26  Aortic pressure: 124/71  PA saturation: 64%  Aortic saturation: 100% 8/16 RENAL U/S:  Negative renal ultrasound. 7/18 Echocardiogram:  LVEF 40-45% with diffuse hypokinesis & akinesis of the apical myocardium.  -Grade 2 diastolic dysfunction. 8/21 PCXR:  Compared with previous x-ray from 8/10. Patient demonstrates significant increase in bilateral interstitial markings consistent with pulmonary edema as well as silhouetting of bilateral hemidiaphragms with lower lung hazy opacification consistent with pleural effusions. No consolidation appreciated or air bronchograms. Mediastinum otherwise normal in contour. 8/22 PCXR: Continued pulmonary edema mild improvement    8/17 - Discharge from hospital after admission with acute renal failure >> diuretics held  8/21 - Presented from heart failure clinic with hypoxic respiratory failure   I have personally reviewed and interpreted all radiology studies and my findings are as above.  VENTILATOR SETTINGS:    Cultures   Antimicrobials: Anti-infectives    Start     Stop   03/31/17 1700  vancomycin (VANCOCIN) 1,500 mg in sodium chloride 0.9 % 500 mL IVPB         03/30/17 1800  levofloxacin (LEVAQUIN) IVPB 500 mg  Status:  Discontinued     03/30/17 0855   03/30/17 0900  aztreonam (AZACTAM) 1 g in dextrose 5 % 50 mL IVPB         03/29/17 1800  vancomycin (VANCOCIN) 1,500 mg in sodium chloride 0.9 % 500 mL IVPB  Status:  Discontinued     03/30/17 0724   03/28/17 1700  aztreonam (AZACTAM) 2 g in dextrose 5 % 50 mL  IVPB  Status:  Discontinued     03/30/17 0724   03/28/17 1700  levofloxacin (LEVAQUIN) IVPB 750 mg  Status:  Discontinued     03/30/17 0724   03/28/17 1645  vancomycin (VANCOCIN) 2,500 mg in sodium chloride 0.9 % 500 mL IVPB     03/28/17 2303        Devices    LINES / TUBES:  FOLEY 8/21 >>> PIV     Continuous Infusions: . sodium chloride    . aztreonam Stopped (03/30/17 0931)  . heparin 1,400 Units/hr (03/30/17 0720)  . nitroGLYCERIN Stopped (03/29/17 1559)  . [START ON 03/31/2017] vancomycin       Objective: Vitals:   03/30/17 0529 03/30/17 0544 03/30/17 0553 03/30/17 0721  BP:  94/69 94/63 97/60   Pulse: 88 94 98 90  Resp: 17 16 18 17   Temp: 99.7 F (37.6 C) 99.9 F (37.7 C) 99.9 F (37.7 C) 99.9 F (37.7 C)  TempSrc:      SpO2: 96% 97% 98% 96%  Weight:      Height:        Intake/Output Summary (Last 24 hours) at 03/30/17 0626 Last data filed at 03/30/17 0600  Gross per 24 hour  Intake  2141.55 ml  Output              860 ml  Net          1281.55 ml   Filed Weights   03/29/17 0500 03/30/17 0426  Weight: 273 lb 13 oz (124.2 kg) 281 lb 8.4 oz (127.7 kg)    Examination:  General: A/O 4, positive acute respiratory distress Neck:  Negative scars, masses, torticollis, lymphadenopathy, JVD Lungs: diffuse poor air movement, negative  wheezes or crackles Cardiovascular: Tachycardic Regular rhythm without murmur gallop or rub normal S1 and S2 Abdomen: Obese, negative abdominal pain, nondistended, positive soft, bowel sounds, no rebound, no ascites, no appreciable mass Extremities: No significant cyanosis, clubbing, or edema bilateral lower extremities Skin: Negative rashes, lesions, ulcers Psychiatric:  Negative depression, positive anxiety, negative fatigue, negative mania  Central nervous system:  Cranial nerves II through XII intact, tongue/uvula midline, all extremities muscle strength 5/5, sensation intact throughout, negative dysarthria,  negative expressive aphasia, negative receptive aphasia.  .     Data Reviewed: Care during the described time interval was provided by me .  I have reviewed this patient's available data, including medical history, events of note, physical examination, and all test results as part of my evaluation.   CBC:  Recent Labs Lab 03/23/17 1405 03/24/17 0339 03/28/17 1137 03/29/17 0304 03/30/17 0343  WBC 6.8 6.1 15.3* 16.1* 12.0*  NEUTROABS  --   --  9.9*  --   --   HGB 12.4* 12.3* 16.2 13.0 12.3*  HCT 36.8* 37.0* 47.3 38.7* 36.7*  MCV 93.9 94.9 95.7 93.5 94.6  PLT 154 143* 223 173 151   Basic Metabolic Panel:  Recent Labs Lab 03/24/17 0339 03/28/17 1315 03/28/17 1956 03/29/17 0304 03/30/17 0343  NA 137 139 139 139 138  K 4.0 5.3* 5.0 4.2 3.3*  CL 106 106 107 106 103  CO2 22 16* 18* 18* 22  GLUCOSE 93 296* 124* 143* 130*  BUN 61* 24* 28* 35* 49*  CREATININE 3.65* 2.86* 3.38* 3.72* 3.87*  CALCIUM 9.0 9.3 9.1 8.8* 8.5*  MG  --   --   --  1.8 1.8  PHOS  --   --   --  2.2* 2.2*   GFR: Estimated Creatinine Clearance: 28.9 mL/min (A) (by C-G formula based on SCr of 3.87 mg/dL (H)). Liver Function Tests:  Recent Labs Lab 03/23/17 1405 03/28/17 1315  AST 21 33  ALT 24 23  ALKPHOS 79 105  BILITOT 1.0 1.2  PROT 6.9 7.8  ALBUMIN 4.2 4.3   No results for input(s): LIPASE, AMYLASE in the last 168 hours. No results for input(s): AMMONIA in the last 168 hours. Coagulation Profile: No results for input(s): INR, PROTIME in the last 168 hours. Cardiac Enzymes:  Recent Labs Lab 03/24/17 0743 03/28/17 1137 03/28/17 1956 03/29/17 0304  CKTOTAL 324  --   --   --   TROPONINI  --  0.04* 0.38* 0.53*   BNP (last 3 results) No results for input(s): PROBNP in the last 8760 hours. HbA1C:  Recent Labs  03/28/17 1646  HGBA1C 7.4*   CBG:  Recent Labs Lab 03/29/17 1617 03/29/17 2027 03/29/17 2321 03/30/17 0422 03/30/17 0724  GLUCAP 162* 211* 153* 143* 127*   Lipid  Profile: No results for input(s): CHOL, HDL, LDLCALC, TRIG, CHOLHDL, LDLDIRECT in the last 72 hours. Thyroid Function Tests: No results for input(s): TSH, T4TOTAL, FREET4, T3FREE, THYROIDAB in the last 72 hours. Anemia Panel: No results for input(s):  VITAMINB12, FOLATE, FERRITIN, TIBC, IRON, RETICCTPCT in the last 72 hours. Urine analysis:    Component Value Date/Time   COLORURINE YELLOW 03/28/2017 1404   APPEARANCEUR HAZY (A) 03/28/2017 1404   LABSPEC 1.011 03/28/2017 1404   PHURINE 5.0 03/28/2017 1404   GLUCOSEU NEGATIVE 03/28/2017 1404   HGBUR SMALL (A) 03/28/2017 1404   BILIRUBINUR NEGATIVE 03/28/2017 1404   KETONESUR NEGATIVE 03/28/2017 1404   PROTEINUR 100 (A) 03/28/2017 1404   NITRITE NEGATIVE 03/28/2017 1404   LEUKOCYTESUR NEGATIVE 03/28/2017 1404   Sepsis Labs: @LABRCNTIP (procalcitonin:4,lacticidven:4)  ) Recent Results (from the past 240 hour(s))  Culture, blood (routine x 2)     Status: None (Preliminary result)   Collection Time: 03/28/17  4:47 PM  Result Value Ref Range Status   Specimen Description BLOOD RIGHT WRIST  Final   Special Requests   Final    BOTTLES DRAWN AEROBIC ONLY Blood Culture adequate volume   Culture NO GROWTH < 24 HOURS  Final   Report Status PENDING  Incomplete  Culture, blood (routine x 2)     Status: None (Preliminary result)   Collection Time: 03/28/17  4:54 PM  Result Value Ref Range Status   Specimen Description BLOOD RIGHT HAND  Final   Special Requests   Final    BOTTLES DRAWN AEROBIC ONLY Blood Culture adequate volume   Culture NO GROWTH < 24 HOURS  Final   Report Status PENDING  Incomplete  MRSA PCR Screening     Status: None   Collection Time: 03/28/17  5:45 PM  Result Value Ref Range Status   MRSA by PCR NEGATIVE NEGATIVE Final    Comment:        The GeneXpert MRSA Assay (FDA approved for NASAL specimens only), is one component of a comprehensive MRSA colonization surveillance program. It is not intended to diagnose  MRSA infection nor to guide or monitor treatment for MRSA infections.   Culture, expectorated sputum-assessment     Status: None   Collection Time: 03/29/17  3:05 PM  Result Value Ref Range Status   Specimen Description SPUTUM  Final   Special Requests Normal  Final   Sputum evaluation   Final    Sputum specimen not acceptable for testing.  Please recollect.   Gram Stain Report Called to,Read Back By and Verified With: S TOTTLE RN AT 1645 ON [id number] BY SJW    Report Status 03/29/2017 FINAL  Final         Radiology Studies: Ct Chest Wo Contrast  Result Date: 03/28/2017 CLINICAL DATA:  Shortness of breath. EXAM: CT CHEST WITHOUT CONTRAST TECHNIQUE: Multidetector CT imaging of the chest was performed following the standard protocol without IV contrast. COMPARISON:  Radiograph of same day. FINDINGS: Cardiovascular: Atherosclerosis of thoracic aorta is noted without aneurysm formation. Status post coronary artery bypass graft. No pericardial effusion is noted. Mediastinum/Nodes: No enlarged mediastinal or axillary lymph nodes. Thyroid gland, trachea, and esophagus demonstrate no significant findings. Lungs/Pleura: No pneumothorax or significant pleural effusion is noted. Bilateral posterior basilar opacities are noted, right greater than left, concerning for pneumonia or possibly edema. Multifocal airspace opacities are noted in right upper lobe concerning for pneumonia or possibly edema. Upper Abdomen: No acute abnormality. Musculoskeletal: No chest wall mass or suspicious bone lesions identified. IMPRESSION: Bilateral posterior basilar opacities are noted, right greater than left, concerning for pneumonia or possibly edema. Multifocal airspace opacities are noted in the right upper lobe is well concerning for pneumonia or possibly edema. Aortic Atherosclerosis (ICD10-I70.0). Electronically Signed  By: Lupita Raider, M.D.   On: 03/28/2017 17:45   Dg Chest Port 1 View  Result Date:  03/29/2017 CLINICAL DATA:  Respiratory failure . EXAM: PORTABLE CHEST 1 VIEW COMPARISON:  CT 03/28/2017.  Chest x-ray 03/28/2017, 03/17/2017. FINDINGS: Prior CABG. Cardiomegaly. Persistent but improved bilateral pulmonary infiltrates/ edema. No prominent pleural effusion. No pneumothorax. IMPRESSION: Prior CABG. Cardiomegaly. Persistent but improved bilateral pulmonary infiltrates/edema. Findings suggest improving congestive heart failure. Electronically Signed   By: Maisie Fus  Register   On: 03/29/2017 06:57   Dg Chest Port 1 View  Result Date: 03/28/2017 CLINICAL DATA:  Severe shortness of breath began today. History of CHF, COPD, former smoker. EXAM: PORTABLE CHEST 1 VIEW COMPARISON:  Chest x-ray of March 17, 2017 FINDINGS: The lungs are adequately inflated. The interstitial markings are increased. The pulmonary vascularity is engorged. The cardiac silhouette is enlarged. The patient has undergone previous CABG. No pleural effusion is observed. IMPRESSION: CHF with pulmonary interstitial edema. The appearance of the chest has deteriorated since the study of 11 days ago. Electronically Signed   By: David  [place] M.D.   On: 03/28/2017 11:54        Scheduled Meds: . carvedilol  3.125 mg Oral BID WC  . DULoxetine  30 mg Oral Daily  . famotidine  20 mg Oral Daily  . gabapentin  300 mg Oral BID  . hydrALAZINE  25 mg Oral Q8H  . insulin aspart  0-9 Units Subcutaneous Q4H  . ipratropium-albuterol  3 mL Nebulization Q6H  . ondansetron (ZOFRAN) IV  4 mg Intravenous Once  . QUEtiapine  25 mg Oral QHS  . rosuvastatin  40 mg Oral Daily  . torsemide  40 mg Oral Daily   Continuous Infusions: . sodium chloride    . aztreonam Stopped (03/30/17 0931)  . heparin 1,400 Units/hr (03/30/17 0720)  . nitroGLYCERIN Stopped (03/29/17 1559)  . [START ON 03/31/2017] vancomycin       LOS: 2 days    Time spent: 40 minutes    Devany Aja, Roselind Messier, MD [facility] Pager [phone]   If 7PM-7AM, please  contact night-coverage [email or web address] Password [email or web address] 03/30/2017, 9:03 AM

## 2017-03-30 NOTE — Progress Notes (Signed)
PT Cancellation Note  Patient Details Name: TRAVONE STROLE MRN: [id number] DOB: [birth year]   Cancelled Treatment:    Reason Eval/Treat Not Completed: Medical issues which prohibited therapy Pt with upward trending troponin. Will await clearance from cardiology prior to PT evaluation.  Blake Divine A Delisa Finck 03/30/2017, 1:50 PM Mylo Red, PT, DPT [phone]

## 2017-03-30 NOTE — Progress Notes (Signed)
Advanced Heart Failure Rounding Note   Subjective:    Admitted with acute hypoxic respiratory failure and flash pulmonary edema.   Yesterday NTG drip weaned off. Hydralazine increased and low dose coreg added. Had lots of nausea and vomiting. Has been hypotensive earlier today.    Today feeling better. Denies SOB. Denies nausea.      Objective:   Weight Range: 281 lb 8.4 oz (127.7 kg) Body mass index is 40.39 kg/m.   Vital Signs:   Temp:  [97.4 F (36.3 C)-100.2 F (37.9 C)] 99.9 F (37.7 C) (08/23 0721) Pulse Rate:  [43-132] 90 (08/23 0721) Resp:  [11-25] 17 (08/23 0721) BP: (71-168)/(43-125) 97/60 (08/23 0721) SpO2:  [92 %-98 %] 96 % (08/23 0721) Weight:  [281 lb 8.4 oz (127.7 kg)] 281 lb 8.4 oz (127.7 kg) (08/23 0426) Last BM Date: 03/28/17  Weight change: Filed Weights   03/29/17 0500 03/30/17 0426  Weight: 273 lb 13 oz (124.2 kg) 281 lb 8.4 oz (127.7 kg)    Intake/Output:   Intake/Output Summary (Last 24 hours) at 03/30/17 0802 Last data filed at 03/30/17 0600  Gross per 24 hour  Intake          2207.55 ml  Output              860 ml  Net          1347.55 ml      Physical Exam    General:  Appears chronicallly ill. No resp difficulty HEENT: normal Neck: supple. JVP 8-9 cm. Carotids 2+ bilat; no bruits. No lymphadenopathy or thryomegaly appreciated. Cor: PMI nondisplaced. Regular rate & rhythm. No rubs, gallops or murmurs. Lungs: coarse throughout. On 4 liters .  Abdomen: soft, nontender, distended. No hepatosplenomegaly. No bruits or masses. Good bowel sounds. Extremities: no cyanosis, clubbing, rash, edema Neuro: alert & orientedx3, cranial nerves grossly intact. moves all 4 extremities w/o difficulty. Affect pleasant i Telemetry  A fib/flutter 80-90s  EKG     A flutter 112 bpm EKG in ED   Labs    CBC  Recent Labs  03/28/17 1137 03/29/17 0304 03/30/17 0343  WBC 15.3* 16.1* 12.0*  NEUTROABS 9.9*  --   --   HGB 16.2 13.0 12.3*    HCT 47.3 38.7* 36.7*  MCV 95.7 93.5 94.6  PLT 223 173 151   Basic Metabolic Panel  Recent Labs  03/29/17 0304 03/30/17 0343  NA 139 138  K 4.2 3.3*  CL 106 103  CO2 18* 22  GLUCOSE 143* 130*  BUN 35* 49*  CREATININE 3.72* 3.87*  CALCIUM 8.8* 8.5*  MG 1.8 1.8  PHOS 2.2* 2.2*   Liver Function Tests  Recent Labs  03/28/17 1315  AST 33  ALT 23  ALKPHOS 105  BILITOT 1.2  PROT 7.8  ALBUMIN 4.3   No results for input(s): LIPASE, AMYLASE in the last 72 hours. Cardiac Enzymes  Recent Labs  03/28/17 1137 03/28/17 1956 03/29/17 0304  TROPONINI 0.04* 0.38* 0.53*    BNP: BNP (last 3 results)  Recent Labs  12/06/16 1429 02/06/17 1136 03/28/17 1137  BNP 309.3* 330.6* 1,836.9*    ProBNP (last 3 results) No results for input(s): PROBNP in the last 8760 hours.   D-Dimer No results for input(s): DDIMER in the last 72 hours. Hemoglobin A1C  Recent Labs  03/28/17 1646  HGBA1C 7.4*   Fasting Lipid Panel No results for input(s): CHOL, HDL, LDLCALC, TRIG, CHOLHDL, LDLDIRECT in the last 72 hours. Thyroid  Function Tests No results for input(s): TSH, T4TOTAL, T3FREE, THYROIDAB in the last 72 hours.  Invalid input(s): FREET3  Other results:   Imaging    No results found.   Medications:     Scheduled Medications: . carvedilol  3.125 mg Oral BID WC  . DULoxetine  30 mg Oral Daily  . famotidine  20 mg Oral Daily  . gabapentin  300 mg Oral BID  . hydrALAZINE  50 mg Oral Q8H  . insulin aspart  0-9 Units Subcutaneous Q4H  . ipratropium-albuterol  3 mL Nebulization Q6H  . ondansetron (ZOFRAN) IV  4 mg Intravenous Once  . QUEtiapine  25 mg Oral QHS  . rosuvastatin  40 mg Oral Daily  . torsemide  40 mg Oral Daily    Infusions: . sodium chloride    . aztreonam    . heparin 1,200 Units/hr (03/29/17 1639)  . levofloxacin (LEVAQUIN) IV    . nitroGLYCERIN Stopped (03/29/17 1559)  . [START ON 03/31/2017] vancomycin      PRN Medications: sodium  chloride, albuterol, hydrALAZINE, ipratropium-albuterol, promethazine    Patient Profile   Christopher [name] is 56 year old with a history of CAD, CABG x4 and R CEA 2017, ICM, chronic systolic heart failure, and A flutter admitted with acute respiratory failure and a/c systolic heart failure.   Assessment/Plan   1. Acute hypoxemic respiratory failure: Suspect flash pulmonary edema in setting of hypertensive emergency.  He has been off Entresto and spironolactone and on lower dose of Coreg since recent admission with AKI.  He has also been on less torsemide.  CXR with CHF, volume overloaded on exam, BNP up.  Off Bipap and on 6 liters oxygen. Off NTG drip.  2. CAD: s/p CABG. Presented with dyspnea rather than chest pain.  TnI 0.04 is likely demand ischemia with volume overload. No CP.  -Troponin 0.04> 0.38>0.53.   - Continue Crestor.  - has not been on ASA given apixaban use.  3. Atrial flutter: Persistent atypical flutter.  He has seen EP and they are deciding +/- ablation.   Controlled rate.   - Off apixaban.  On heparin. Last week while hospitalized he missed dose of apixaban.  - Set up TEE/DC-CV once more stable.  Continue amio 200 mg daily. May need to stop if nausea persists . He was on this prior to admit.   4. Acute on chronic systolic CHF: Ischemic cardiomyopathy. Echo in 3/17 with EF 30-35%, improved to 40-45% on echo in 7/18 with mildly dilated RV.  Entresto, spironolactone on hold and on lower doses of Lasix and Coreg in setting of AKI.  On admit had flash pulmonary edema and hypertensive emergency. Yesterday CXR was improving.   - Off ntg drip. SBP lower today. Cut back hydralazine 25 mg three times a day and will put on po Imdur.   - Volume status ok. Hold torsemide today with elevated creatinine. Poor po intake.  - Continue coreg 3.25 mg twice a day.  - Continue to hold Entresto and spironolactone with creatinine 3.72   5. AKI on CKD stage 3: Recent rise in creatinine to 5, meds  held.  Creatinine up to 2.8>3.7>3.8   Keep off spiro and entresto.    6. Carotid stenosis: Right CEA in 2017. Followed by [facility].  7. PAD: Extensive on last peripheral dopplers, claudication has worsened in the last few months and is now limiting. Needs followup with Dr. Arbie Cookey as outpatient.  8. ID: Possible Pneumonia. On antibiotics- vanc/levaquin/axteronam.  WBC trending down 16>12. Procalcitonin 16.4. Blood CX- NGTD 9. Nausea/Vomiting- ? Medication from NTG drip. Improved today  qtc prolonged yesterday . Check EKG now.   Length of Stay: 2   Tonye Becket, NP  03/30/2017, 8:02 AM  Advanced Heart Failure Team Pager [phone] (M-F; 7a - 4p)  Please contact [facility] for night-coverage after hours (4p -7a ) and weekends on [email or web address]  Patient seen with NP, agree with the above note.  Based on chest CT and procalcitonin 16 => 8, suspect he has PNA.  He is on broad spectrum antibiotics, cultures so far negative.  Possible mixed presentation with PNA and then developed hypertensive emergency with flash pulmonary edema. Creatinine up to 3.8.    - He is starting to build up fluid again but not markedly volume overloaded.  Will hold diuretics again today, probably will need torsemide tomorrow.  - Hydralazine decreased to 25 mg tid with hypotension overnight, will add Imdur 30 (off NTG gtt).  - Continue Coreg 3.125 mg bid.  - QTc difficult to ascertain due to atrial flutter, but suspect prolonged.  Will stop levofloxacin, continue vancomycin and aztreonam.    Mild increase in troponin is likely demand ischemia.   He remains in atrial flutter, currently on heparin gtt => transition back to apixaban when creatinine is trending down.  Eventually will need TEE-guided DCCV. EP contemplating ablation.   [name] 03/30/2017 9:42 AM

## 2017-03-30 NOTE — Progress Notes (Signed)
ANTICOAGULATION CONSULT NOTE - F/u Consult  Pharmacy Consult for heparin Indication: atrial fibrillation/flutter  Allergies  Allergen Reactions  . Losartan Shortness Of Breath  . Elita Quick [Ceftazidime] Rash    Rash with blisters  . Lisinopril Rash   Heparin dosing weight: 101 kg  Assessment: 56 year old male on Eliquis PTA and now transitioned to heparin. Last dose of Eliquis was on 8/21. APTT borderline low at 60, heparin level fasely elevated at > 2.2. Hgb 12.3, plts wnl.  Goal of Therapy:  Heparin level 0.3-0.5 units/ml aPTT 66-102 seconds Monitor platelets by anticoagulation protocol: Yes   Plan:  Increase heparin gtt to 1,400 units/hr Check aPTT in 8 hrs Monitor daily aPTT / heparin level, CBC, s/s of bleed  Enzo Bi, PharmD, [facility] Clinical Pharmacist Pager [phone] 03/30/2017 7:19 AM

## 2017-03-30 NOTE — Progress Notes (Signed)
ANTICOAGULATION CONSULT NOTE - Follow Up Consult  Pharmacy Consult for Heparin Indication: atrial fibrillation/flutter  Allergies  Allergen Reactions  . Losartan Shortness Of Breath  . Elita Quick [Ceftazidime] Rash    Rash with blisters  . Lisinopril Rash    Patient Measurements: Height: 5\' 10"  (177.8 cm) Weight: 281 lb 8.4 oz (127.7 kg) IBW/kg (Calculated) : 73 Heparin Dosing Weight: 101 kg  Vital Signs: Temp: 98.5 F (36.9 C) (08/23 1558) Temp Source: Oral (08/23 1558) BP: 118/84 (08/23 1558) Pulse Rate: 71 (08/23 1558)  Labs:  Recent Labs  03/28/17 1137  03/28/17 1956 03/29/17 0304 03/30/17 0343 03/30/17 1517  HGB 16.2  --   --  13.0 12.3*  --   HCT 47.3  --   --  38.7* 36.7*  --   PLT 223  --   --  173 151  --   APTT  --   < > 33 83* 60* 92*  HEPARINUNFRC  --   --  >2.20* >2.20* >2.20*  --   CREATININE  --   < > 3.38* 3.72* 3.87*  --   TROPONINI 0.04*  --  0.38* 0.53*  --   --   < > = values in this interval not displayed.  Estimated Creatinine Clearance: 28.9 mL/min (A) (by C-G formula based on SCr of 3.87 mg/dL (H)).   Medications:  Heparin @ 1400 units/hr  Assessment: 56yom on eliquis pta for afib, transitioned to heparin in the setting of renal failure (last eliquis dose 8/21). Using APTTs to guide heparin dosing as eliquis falsely elevates heparin levels. APTT is now therapeutic at 92 seconds.   Goal of Therapy:  APTT 66-102 seconds Heparin level 0.3-0.7 units/ml Monitor platelets by anticoagulation protocol: Yes   Plan:  1) Continue heparin at 1400 units/hr 2) Follow up daily heparin level, APTT, CBC  Fredrik Rigger 03/30/2017,4:07 PM

## 2017-03-31 DIAGNOSIS — J431 Panlobular emphysema: Secondary | ICD-10-CM

## 2017-03-31 LAB — GLUCOSE, CAPILLARY
GLUCOSE-CAPILLARY: 102 mg/dL — AB (ref 65–99)
GLUCOSE-CAPILLARY: 104 mg/dL — AB (ref 65–99)
GLUCOSE-CAPILLARY: 105 mg/dL — AB (ref 65–99)
GLUCOSE-CAPILLARY: 109 mg/dL — AB (ref 65–99)
GLUCOSE-CAPILLARY: 122 mg/dL — AB (ref 65–99)
GLUCOSE-CAPILLARY: 164 mg/dL — AB (ref 65–99)
Glucose-Capillary: 166 mg/dL — ABNORMAL HIGH (ref 65–99)
Glucose-Capillary: 155 mg/dL — ABNORMAL HIGH (ref 65–99)

## 2017-03-31 LAB — HEPARIN LEVEL (UNFRACTIONATED): Heparin Unfractionated: 1.58 IU/mL — ABNORMAL HIGH (ref 0.30–0.70)

## 2017-03-31 LAB — COMPREHENSIVE METABOLIC PANEL
ALT: 21 U/L (ref 17–63)
AST: 37 U/L (ref 15–41)
Albumin: 3.4 g/dL — ABNORMAL LOW (ref 3.5–5.0)
Alkaline Phosphatase: 63 U/L (ref 38–126)
Anion gap: 13 (ref 5–15)
BILIRUBIN TOTAL: 1.1 mg/dL (ref 0.3–1.2)
BUN: 47 mg/dL — AB (ref 6–20)
CO2: 25 mmol/L (ref 22–32)
CREATININE: 3.35 mg/dL — AB (ref 0.61–1.24)
Calcium: 8.4 mg/dL — ABNORMAL LOW (ref 8.9–10.3)
Chloride: 101 mmol/L (ref 101–111)
GFR calc Af Amer: 22 mL/min — ABNORMAL LOW (ref >60)
GFR, EST NON AFRICAN AMERICAN: 19 mL/min — AB (ref >60)
Glucose, Bld: 108 mg/dL — ABNORMAL HIGH (ref 65–99)
POTASSIUM: 3.4 mmol/L — AB (ref 3.5–5.1)
Sodium: 139 mmol/L (ref 135–145)
TOTAL PROTEIN: 6.7 g/dL (ref 6.5–8.1)

## 2017-03-31 LAB — CBC
HEMATOCRIT: 34.2 % — AB (ref 39.0–52.0)
Hemoglobin: 11.4 g/dL — ABNORMAL LOW (ref 13.0–17.0)
MCH: 31.4 pg (ref 26.0–34.0)
MCHC: 33.3 g/dL (ref 30.0–36.0)
MCV: 94.2 fL (ref 78.0–100.0)
Platelets: 126 10*3/uL — ABNORMAL LOW (ref 150–400)
RBC: 3.63 MIL/uL — ABNORMAL LOW (ref 4.22–5.81)
RDW: 17 % — AB (ref 11.5–15.5)
WBC: 8.7 10*3/uL (ref 4.0–10.5)

## 2017-03-31 LAB — MAGNESIUM: MAGNESIUM: 2.1 mg/dL (ref 1.7–2.4)

## 2017-03-31 LAB — APTT: aPTT: 88 seconds — ABNORMAL HIGH (ref 24–36)

## 2017-03-31 LAB — LACTIC ACID, PLASMA: Lactic Acid, Venous: 1.3 mmol/L (ref 0.5–1.9)

## 2017-03-31 MED ORDER — POTASSIUM CHLORIDE CRYS ER 20 MEQ PO TBCR
50.0000 meq | EXTENDED_RELEASE_TABLET | Freq: Once | ORAL | Status: AC
  Administered 2017-03-31: 50 meq via ORAL
  Filled 2017-03-31: qty 1

## 2017-03-31 NOTE — Plan of Care (Signed)
Problem: Health Behavior/Discharge Planning: Goal: Ability to manage health-related needs will improve Outcome: Not Progressing Patient is compliant with treatment plan and medication regimen. He requires minimal assistance with ADL's.

## 2017-03-31 NOTE — Progress Notes (Signed)
Advanced Heart Failure Rounding Note   Subjective:    Admitted with acute hypoxic respiratory failure and flash pulmonary edema. CT with PNA. Now on antibiotics.   Feeling well today, denies SOB, orthopnea.      Objective:   Weight Range: 272 lb 14.9 oz (123.8 kg) Body mass index is 39.16 kg/m.   Vital Signs:   Temp:  [97.8 F (36.6 C)-99.7 F (37.6 C)] 97.8 F (36.6 C) (08/24 0727) Pulse Rate:  [52-91] 81 (08/24 0727) Resp:  [13-21] 16 (08/24 0727) BP: (81-119)/(52-95) 119/89 (08/24 0727) SpO2:  [97 %-100 %] 99 % (08/24 0727) Weight:  [272 lb 14.9 oz (123.8 kg)] 272 lb 14.9 oz (123.8 kg) (08/24 0341) Last BM Date: 03/30/17  Weight change: Filed Weights   03/29/17 0500 03/30/17 0426 03/31/17 0341  Weight: 273 lb 13 oz (124.2 kg) 281 lb 8.4 oz (127.7 kg) 272 lb 14.9 oz (123.8 kg)    Intake/Output:   Intake/Output Summary (Last 24 hours) at 03/31/17 1047 Last data filed at 03/31/17 0700  Gross per 24 hour  Intake          1358.67 ml  Output             2650 ml  Net         -1291.33 ml      Physical Exam    General: Well appearing. No resp difficulty. HEENT: Normal Neck: Supple. JVP 7-8 cm. Carotids 2+ bilat; no bruits. No thyromegaly or nodule noted. Cor: PMI nondisplaced. RRR, No M/G/R noted Lungs: rhonchi in bilateral lower lobes.  Abdomen: Soft, non-tender, non-distended, no HSM. No bruits or masses. +BS  Extremities: No cyanosis, clubbing, rash, R and LLE no edema.  Neuro: Alert & orientedx3, cranial nerves grossly intact. moves all 4 extremities w/o difficulty. Affect pleasant   Telemetry   Aflutter - personally reviewed.   EKG    Aflutter - personally reviewed.   Labs    CBC  Recent Labs  03/28/17 1137  03/30/17 0343 03/31/17 0329  WBC 15.3*  < > 12.0* 8.7  NEUTROABS 9.9*  --   --   --   HGB 16.2  < > 12.3* 11.4*  HCT 47.3  < > 36.7* 34.2*  MCV 95.7  < > 94.6 94.2  PLT 223  < > 151 126*  < > = values in this interval not  displayed. Basic Metabolic Panel  Recent Labs  03/29/17 0304 03/30/17 0343 03/31/17 0329  NA 139 138 139  K 4.2 3.3* 3.4*  CL 106 103 101  CO2 18* 22 25  GLUCOSE 143* 130* 108*  BUN 35* 49* 47*  CREATININE 3.72* 3.87* 3.35*  CALCIUM 8.8* 8.5* 8.4*  MG 1.8 1.8 2.1  PHOS 2.2* 2.2*  --    Liver Function Tests  Recent Labs  03/28/17 1315 03/31/17 0329  AST 33 37  ALT 23 21  ALKPHOS 105 63  BILITOT 1.2 1.1  PROT 7.8 6.7  ALBUMIN 4.3 3.4*   No results for input(s): LIPASE, AMYLASE in the last 72 hours. Cardiac Enzymes  Recent Labs  03/28/17 1137 03/28/17 1956 03/29/17 0304  TROPONINI 0.04* 0.38* 0.53*    BNP: BNP (last 3 results)  Recent Labs  12/06/16 1429 02/06/17 1136 03/28/17 1137  BNP 309.3* 330.6* 1,836.9*    ProBNP (last 3 results) No results for input(s): PROBNP in the last 8760 hours.   D-Dimer No results for input(s): DDIMER in the last 72 hours. Hemoglobin A1C  Recent Labs  03/28/17 1646  HGBA1C 7.4*   Fasting Lipid Panel No results for input(s): CHOL, HDL, LDLCALC, TRIG, CHOLHDL, LDLDIRECT in the last 72 hours. Thyroid Function Tests No results for input(s): TSH, T4TOTAL, T3FREE, THYROIDAB in the last 72 hours.  Invalid input(s): FREET3  Other results:   Imaging   CT CHEST WITHOUT CONTRAST FINDINGS: Cardiovascular: Atherosclerosis of thoracic aorta is noted without aneurysm formation. Status post coronary artery bypass graft. No pericardial effusion is noted.  Mediastinum/Nodes: No enlarged mediastinal or axillary lymph nodes. Thyroid gland, trachea, and esophagus demonstrate no significant findings.  Lungs/Pleura: No pneumothorax or significant pleural effusion is noted. Bilateral posterior basilar opacities are noted, right greater than left, concerning for pneumonia or possibly edema. Multifocal airspace opacities are noted in right upper lobe concerning for pneumonia or possibly edema.  Upper Abdomen: No acute  abnormality.  Musculoskeletal: No chest wall mass or suspicious bone lesions identified.  IMPRESSION: Bilateral posterior basilar opacities are noted, right greater than left, concerning for pneumonia or possibly edema. Multifocal airspace opacities are noted in the right upper lobe is well concerning for pneumonia or possibly edema.  Medications:     Scheduled Medications: . carvedilol  3.125 mg Oral BID WC  . DULoxetine  30 mg Oral Daily  . famotidine  20 mg Oral Daily  . gabapentin  300 mg Oral BID  . hydrALAZINE  25 mg Oral Q8H  . insulin aspart  0-9 Units Subcutaneous Q4H  . isosorbide mononitrate  30 mg Oral Daily  . ondansetron (ZOFRAN) IV  4 mg Intravenous Once  . potassium chloride  50 mEq Oral Once  . QUEtiapine  25 mg Oral QHS  . rosuvastatin  40 mg Oral Daily    Infusions: . sodium chloride    . aztreonam 1 g (03/31/17 0839)  . heparin 1,400 Units/hr (03/31/17 0620)  . nitroGLYCERIN Stopped (03/29/17 1559)  . vancomycin      PRN Medications: sodium chloride, albuterol, hydrALAZINE, ipratropium-albuterol, promethazine    Patient Profile   [name] is 56 year old with a history of CAD, CABG x4 and R CEA 2017, ICM, chronic systolic heart failure, and A flutter admitted with acute respiratory failure and a/c systolic heart failure.   Assessment/Plan   1. Acute hypoxemic respiratory failure: Suspect flash pulmonary edema in setting of hypertensive emergency.  He has been off Entresto and spironolactone and on lower dose of Coreg since recent admission with AKI.  - Improved, now on room air.   2. CAD: s/p CABG. Presented with dyspnea rather than chest pain.  TnI 0.04 is likely demand ischemia with volume overload. No CP.  -Troponin 0.04> 0.38>0.53.   - Continue Crestor.  - has not been on ASA given apixaban use.  - No change.   3. Atrial flutter: Persistent atypical flutter.   - Rate controlled. Plan for TEE/DCCV for now, EP working on plan  regarding ablation (Dr Graciela Husbands to discuss with colleagues).  - Will need to resume Eliquis when creatinine improves.  - Continue Amio 200 mg daily.   4. Acute on chronic systolic CHF: Ischemic cardiomyopathy. Echo in 3/17 with EF 30-35%, improved to 40-45% on echo in 7/18 with mildly dilated RV.  Entresto, spironolactone on hold and on lower doses of Lasix and Coreg in setting of AKI.  On admit had flash pulmonary edema and hypertensive emergency. Yesterday CXR was improving.   - BP soft, continue Coreg 3.125 mg BID - Continue hydralazine 25 mg q 8 hours.  -  Continue isosorbide 30 mg daily.   5. AKI on CKD stage 3: Recent rise in creatinine to 5, meds held.  Creatinine up to 2.8>3.7>3.8>3.35  Keep off spiro and entresto.     6. Carotid stenosis: Right CEA in 2017. Followed by [facility].  - No change.   7. PAD: Extensive on last peripheral dopplers, claudication has worsened in the last few months and is now limiting. Needs followup with Dr. Arbie Cookey as outpatient.  - no change.   8. ID: Suspect PNA.  PCT trending down.  - Now on Aztreonam and vancomycin  9. Nausea/Vomiting - Improved.   Length of Stay: 3  Little Ishikawa, NP  03/31/2017, 10:47 AM  Advanced Heart Failure Team Pager [phone] (M-F; 7a - 4p)  Please contact [facility] for night-coverage after hours (4p -7a ) and weekends on [email or web address]  Patient seen with NP, agree with the above note.    Suspect presentation was PNA + hypertensive emergency (off BP meds with elevated creatinine) and flash pulmonary edema.   Volume status looks ok, weight stable to lower.  Creatinine starting to trend down.  Will need to restart torsemide, likely at 40 mg daily, over the weekend. Continue current Coreg and hydralazine/Imdur.  No BP room for titration.   He remains on aztreonam/vancomycin. Cultures negative.  Will eventually need po regimen to complete 10 days of therapy.   He remains in atrial flutter.  Think we can stop heparin gtt  and restart apixaban with creatinine coming down.  Will plan TEE-guided DCCV possibly on Monday.  Will then need to followup with EP to decide on ablation (atypical flutter).   Christopher [name] 03/31/2017

## 2017-03-31 NOTE — Plan of Care (Signed)
Problem: Safety: Goal: Ability to remain free from injury will improve Outcome: Completed/Met Date Met: 03/31/17 Patient demonstrates no impulsive behavior. Calls for assistance to get out of bed. Bathroom Privileges at this time. Assisting patient to bathroom to prevent tripping over cords.   Problem: Pain Managment: Goal: General experience of comfort will improve Outcome: Completed/Met Date Met: 03/31/17 Patient without complaints of pain during night shift. Some discomfort from vomiting previous night but nothing he wants medication for.   Problem: Physical Regulation: Goal: Ability to maintain clinical measurements within normal limits will improve Outcome: Progressing Patient did not have any nausea or vomiting during night shift. Patient able to get rest. HR remains in A flutter and BP controlled on current regimen.   Problem: Skin Integrity: Goal: Risk for impaired skin integrity will decrease Outcome: Completed/Met Date Met: 03/31/17 Patient able to turn self in bed.   Problem: Tissue Perfusion: Goal: Risk factors for ineffective tissue perfusion will decrease Outcome: Progressing Remains on heparin gtt at this time.   Problem: Activity: Goal: Risk for activity intolerance will decrease Outcome: Completed/Met Date Met: 03/31/17 Patient ambulates to bathroom and turns self in bed. States would "like to walk in the hall tomorrow. "   Problem: Nutrition: Goal: Adequate nutrition will be maintained Outcome: Progressing Tolerated Clear liquid diet.   Problem: Bowel/Gastric: Goal: Will not experience complications related to bowel motility Outcome: Progressing Patient had soft/ loose bowel movement on day and night shift. No evidence of constipation.

## 2017-03-31 NOTE — Progress Notes (Signed)
PROGRESS NOTE    Christopher [name]  YNW:[id number] DOB: [birth year] DOA: 03/28/2017 PCP: Shirlean Mylar, MD   Brief Narrative:  56 year old WM PMHx HTN, Chronic ombined Systolic and Diastolic CHF (EF 65-78%) and follows Dr. Shirlee Latch, HTN, CAD S/P CABG 4, TIA, COPD, Neuropathy on high-dose Neurontin, was recently seen in the ED on 8/10 for feeling 'terrible', nauseous, palpitations and poor by mouth intake. Patient was noted to have a acute renal failure and IV fluid bolus was administered. Patient's creatinine continued to rise despite IV fluid hydration. Nephrology and cardiology were consulted during the admission. Cardiology planned to restart lower dosed torsemide on 8/19 as well as low-dose Coreg twice daily if his blood pressure tolerated.    On the day of discharge (8/17) the patient's blood pressure was 100/61 and he was saturating 98% with a heart rate of 55 bpm. patient reports his dyspnea started abruptly today. Does endorse some subjective fever as well as hot and cold chills. No audible wheezing per patient and family at bedside but he has had a mild nonproductive cough. No chest pain, pressure, or tightness. Denies any sore throat or sinus congestion. No abdominal pain, nausea, or vomiting. He has had some mild diarrhea. Patient reports he has been taking his Coreg and diuretics as prescribed. He has also been taking his systemic anticoagulation. In the emergency department he was started on a nitroglycerin drip for his hypertension and given 2 doses of Lasix 80 mg IV. Patient has had minimal urine output.    Subjective: 8/24  A/O 4, sitting comfortably in chair, negative CP, negative SOB, negative N/V, negative abdominal pain.   Assessment & Plan:   Active Problems:   Acute respiratory failure with hypoxia (HCC)   Acute pulmonary edema (HCC)   Hypertensive emergency   Acute Respiratory Failure with Hypoxia/HCAP -On room air SPO2 99%   -Titrate O2 for sat of 88-92% -DuoNeb  PRN -Complete seven-day course antibiotics    Acute on CKD stage 3 (Baseline appears to be 1.7-2.0   Recent Labs Lab 03/28/17 1315 03/28/17 1956 03/29/17 0304 03/30/17 0343 03/31/17 0329  CREATININE 2.86* 3.38* 3.72* 3.87* 3.35*  -Per previous admission note believed possible overmedication cause of acute renal failure (Neurontin, Cymbalta, tramadol, Aldactone, Torsemide, Entresto), -Patient discharged on 8/17 had been seen by Dr. Hyman Hopes nephrology during that visit. Per his note was awaiting serology (HIV, SPEP/UPEP). -Creatinine beginning to improve -8/16 Renal ultrasound: Negative -8/16 SPEP: Unremarkable -8/17 HIV negative -Nephrology aware patient rehospitalized will arrange for early follow-up  Chronic systolic CHF (congestive heart failure) (HCC) - Hold and Restoril, Aldactone, torsemide, placed on gentle hydration - Cardiology consulted, will notify Dr Shirlee Latch, sent message to Salley Hews via inbox  -Strict in and out since admission +1.0 L -Daily weight Filed Weights   03/29/17 0500 03/30/17 0426 03/31/17 0341  Weight: 273 lb 13 oz (124.2 kg) 281 lb 8.4 oz (127.7 kg) 272 lb 14.9 oz (123.8 kg)  -Coreg 3.125 mg BID -Hydralazine 25 mg TID -Imdur 30 mg daily -Diureses per cardiology   Atrial flutter -NSR at time of exam -Due to patient's waxing& waning /worsening creatinine Coumadin will be choice for long-term anticoagulation. Spoken with Nephrology and Pharmacy and they concur.   Essential hypertension - Currently BP on the softer side. -Hold BP medication   COPD - Currently stable, no wheezing   CAD involving native coronary artery of native heart without angina pectoris -Currently stable, no chest pain, hold all cardiac medications    Hypokalemia -  Potassium goal> 4 -Potassium 50 mEq   Hypomagnesemia -Magnesium goal> 2     DVT prophylaxis: Heparin drip Code Status: Full  Family Communication: None  Disposition Plan: Per  cardiology    Consultants:  Cardiology     Procedures/Significant Events:  10/26/15 PFT: FVC 3.46 L (68%] the FEV1 2.59 L (66%) FEV1/FVC 0.75 FEF 25-75 1.91 L (57%) negative bronchodilator response 11/02/15 RHC:  RA mean: 19  RV: 44/23  PA: 43/25 (33)  Pulse Later wedge pressure mean: 26  Aortic pressure: 124/71  PA saturation: 64%  Aortic saturation: 100%   7/18 Echocardiogram:  LVEF 40-45% with diffuse hypokinesis & akinesis of the apical myocardium.  -Grade 2 diastolic dysfunction. 8/16 RENAL U/S:  Negative renal ultrasound. 8/17 - Discharge from hospital after admission with acute renal failure >> diuretics held 8/21 PCXR:  Compared with previous x-ray from 8/10. Patient demonstrates significant increase in bilateral interstitial markings consistent with pulmonary edema as well as silhouetting of bilateral hemidiaphragms with lower lung hazy opacification consistent with pleural effusions. No consolidation appreciated or air bronchograms. Mediastinum otherwise normal in contour. 8/21 - Presented from heart failure clinic with hypoxic respiratory failure 8/22 PCXR: Continued pulmonary edema mild improvement           I have personally reviewed and interpreted all radiology studies and my findings are as above.  VENTILATOR SETTINGS: None   Cultures 8/17 HIV negative  8/21 blood NGTD 8/22 sputum unacceptable     Antimicrobials: Anti-infectives    Start     Stop   03/31/17 1700  vancomycin (VANCOCIN) 1,500 mg in sodium chloride 0.9 % 500 mL IVPB         03/30/17 1800  levofloxacin (LEVAQUIN) IVPB 500 mg  Status:  Discontinued     03/30/17 0855   03/30/17 0900  aztreonam (AZACTAM) 1 g in dextrose 5 % 50 mL IVPB         03/29/17 1800  vancomycin (VANCOCIN) 1,500 mg in sodium chloride 0.9 % 500 mL IVPB  Status:  Discontinued     03/30/17 0724   03/28/17 1700  aztreonam (AZACTAM) 2 g in dextrose 5 % 50 mL IVPB  Status:  Discontinued     03/30/17 0724    03/28/17 1700  levofloxacin (LEVAQUIN) IVPB 750 mg  Status:  Discontinued     03/30/17 0724   03/28/17 1645  vancomycin (VANCOCIN) 2,500 mg in sodium chloride 0.9 % 500 mL IVPB     03/28/17 2303       Devices None   LINES / TUBES:      Continuous Infusions: . sodium chloride    . aztreonam 1 g (03/31/17 0839)  . heparin 1,400 Units/hr (03/31/17 0620)  . nitroGLYCERIN Stopped (03/29/17 1559)  . vancomycin       Objective: Vitals:   03/31/17 0008 03/31/17 0341 03/31/17 0620 03/31/17 0727  BP: (!) 81/52 105/65 99/61 119/89  Pulse: 72 91  81  Resp: 16 16  16   Temp: 98.2 F (36.8 C) 98.1 F (36.7 C)  97.8 F (36.6 C)  TempSrc: Oral Oral  Oral  SpO2: 98% 98%  99%  Weight:  272 lb 14.9 oz (123.8 kg)    Height:        Intake/Output Summary (Last 24 hours) at 03/31/17 0852 Last data filed at 03/31/17 0700  Gross per 24 hour  Intake          1925.34 ml  Output  2650 ml  Net          -724.66 ml   Filed Weights   03/29/17 0500 03/30/17 0426 03/31/17 0341  Weight: 273 lb 13 oz (124.2 kg) 281 lb 8.4 oz (127.7 kg) 272 lb 14.9 oz (123.8 kg)    Examination:  General: A/O 4, positive acute respiratory distress Neck:  Negative scars, masses, torticollis, lymphadenopathy, JVD Lungs:  clear to auscultation, negative  wheezes or crackles Cardiovascular: Regular rhythm and rate without murmur gallop or rub normal S1 and S2 Abdomen: Obese, negative abdominal pain, nondistended, positive soft, bowel sounds, no rebound, no ascites, no appreciable mass Extremities: No significant cyanosis, clubbing, or edema bilateral lower extremities Skin: Negative rashes, lesions, ulcers Psychiatric:  Negative depression, positive anxiety, negative fatigue, negative mania  Central nervous system:  Cranial nerves II through XII intact, tongue/uvula midline, all extremities muscle strength 5/5, sensation intact throughout, negative dysarthria, negative expressive aphasia, negative  receptive aphasia.  .     Data Reviewed: Care during the described time interval was provided by me .  I have reviewed this patient's available data, including medical history, events of note, physical examination, and all test results as part of my evaluation.   CBC:  Recent Labs Lab 03/28/17 1137 03/29/17 0304 03/30/17 0343 03/31/17 0329  WBC 15.3* 16.1* 12.0* 8.7  NEUTROABS 9.9*  --   --   --   HGB 16.2 13.0 12.3* 11.4*  HCT 47.3 38.7* 36.7* 34.2*  MCV 95.7 93.5 94.6 94.2  PLT 223 173 151 126*   Basic Metabolic Panel:  Recent Labs Lab 03/28/17 1315 03/28/17 1956 03/29/17 0304 03/30/17 0343 03/31/17 0329  NA 139 139 139 138 139  K 5.3* 5.0 4.2 3.3* 3.4*  CL 106 107 106 103 101  CO2 16* 18* 18* 22 25  GLUCOSE 296* 124* 143* 130* 108*  BUN 24* 28* 35* 49* 47*  CREATININE 2.86* 3.38* 3.72* 3.87* 3.35*  CALCIUM 9.3 9.1 8.8* 8.5* 8.4*  MG  --   --  1.8 1.8 2.1  PHOS  --   --  2.2* 2.2*  --    GFR: Estimated Creatinine Clearance: 32.9 mL/min (A) (by C-G formula based on SCr of 3.35 mg/dL (H)). Liver Function Tests:  Recent Labs Lab 03/28/17 1315 03/31/17 0329  AST 33 37  ALT 23 21  ALKPHOS 105 63  BILITOT 1.2 1.1  PROT 7.8 6.7  ALBUMIN 4.3 3.4*   No results for input(s): LIPASE, AMYLASE in the last 168 hours. No results for input(s): AMMONIA in the last 168 hours. Coagulation Profile: No results for input(s): INR, PROTIME in the last 168 hours. Cardiac Enzymes:  Recent Labs Lab 03/28/17 1137 03/28/17 1956 03/29/17 0304  TROPONINI 0.04* 0.38* 0.53*   BNP (last 3 results) No results for input(s): PROBNP in the last 8760 hours. HbA1C:  Recent Labs  03/28/17 1646  HGBA1C 7.4*   CBG:  Recent Labs Lab 03/30/17 1606 03/30/17 2052 03/31/17 0006 03/31/17 0340 03/31/17 0725  GLUCAP 127* 138* 105* 104* 109*   Lipid Profile: No results for input(s): CHOL, HDL, LDLCALC, TRIG, CHOLHDL, LDLDIRECT in the last 72 hours. Thyroid Function  Tests: No results for input(s): TSH, T4TOTAL, FREET4, T3FREE, THYROIDAB in the last 72 hours. Anemia Panel: No results for input(s): VITAMINB12, FOLATE, FERRITIN, TIBC, IRON, RETICCTPCT in the last 72 hours. Urine analysis:    Component Value Date/Time   COLORURINE YELLOW 03/28/2017 1404   APPEARANCEUR HAZY (A) 03/28/2017 1404   LABSPEC 1.011 03/28/2017  1404   PHURINE 5.0 03/28/2017 1404   GLUCOSEU NEGATIVE 03/28/2017 1404   HGBUR SMALL (A) 03/28/2017 1404   BILIRUBINUR NEGATIVE 03/28/2017 1404   KETONESUR NEGATIVE 03/28/2017 1404   PROTEINUR 100 (A) 03/28/2017 1404   NITRITE NEGATIVE 03/28/2017 1404   LEUKOCYTESUR NEGATIVE 03/28/2017 1404   Sepsis Labs: @LABRCNTIP (procalcitonin:4,lacticidven:4)  ) Recent Results (from the past 240 hour(s))  Culture, blood (routine x 2)     Status: None (Preliminary result)   Collection Time: 03/28/17  4:47 PM  Result Value Ref Range Status   Specimen Description BLOOD RIGHT WRIST  Final   Special Requests   Final    BOTTLES DRAWN AEROBIC ONLY Blood Culture adequate volume   Culture NO GROWTH 2 DAYS  Final   Report Status PENDING  Incomplete  Culture, blood (routine x 2)     Status: None (Preliminary result)   Collection Time: 03/28/17  4:54 PM  Result Value Ref Range Status   Specimen Description BLOOD RIGHT HAND  Final   Special Requests   Final    BOTTLES DRAWN AEROBIC ONLY Blood Culture adequate volume   Culture NO GROWTH 2 DAYS  Final   Report Status PENDING  Incomplete  MRSA PCR Screening     Status: None   Collection Time: 03/28/17  5:45 PM  Result Value Ref Range Status   MRSA by PCR NEGATIVE NEGATIVE Final    Comment:        The GeneXpert MRSA Assay (FDA approved for NASAL specimens only), is one component of a comprehensive MRSA colonization surveillance program. It is not intended to diagnose MRSA infection nor to guide or monitor treatment for MRSA infections.   Culture, expectorated sputum-assessment     Status:  None   Collection Time: 03/29/17  3:05 PM  Result Value Ref Range Status   Specimen Description SPUTUM  Final   Special Requests Normal  Final   Sputum evaluation   Final    Sputum specimen not acceptable for testing.  Please recollect.   Gram Stain Report Called to,Read Back By and Verified With: S TOTTLE RN AT 1645 ON [id number] BY SJW    Report Status 03/29/2017 FINAL  Final         Radiology Studies: No results found.      Scheduled Meds: . carvedilol  3.125 mg Oral BID WC  . DULoxetine  30 mg Oral Daily  . famotidine  20 mg Oral Daily  . gabapentin  300 mg Oral BID  . hydrALAZINE  25 mg Oral Q8H  . insulin aspart  0-9 Units Subcutaneous Q4H  . isosorbide mononitrate  30 mg Oral Daily  . ondansetron (ZOFRAN) IV  4 mg Intravenous Once  . QUEtiapine  25 mg Oral QHS  . rosuvastatin  40 mg Oral Daily   Continuous Infusions: . sodium chloride    . aztreonam 1 g (03/31/17 0839)  . heparin 1,400 Units/hr (03/31/17 0620)  . nitroGLYCERIN Stopped (03/29/17 1559)  . vancomycin       LOS: 3 days    Time spent: 40 minutes    Edy Belt, Roselind Messier, MD [facility] Pager [phone]   If 7PM-7AM, please contact night-coverage [email or web address] Password [facility] 03/31/2017, 8:52 AM

## 2017-03-31 NOTE — Evaluation (Signed)
Physical Therapy Evaluation Patient Details Name: Christopher [name] MRN: [id number] DOB: 31-Dec-1960 Today's Date: 03/31/2017   History of Present Illness  Patient is a 56 y/o male who presents with respiratory failure secondary to HCAP and pulmonary edema. PMH includes HTN, Chronic combined Systolic and Diastolic CHF, HTN, CAD S/P CABG 4, TIA, COPD.   Clinical Impression  Patient tolerated ambulating 300' with 2-3/4 DOE, no evidence of LOB. Pt from home with g/f and independent PTA. Seems to be functioning close to baseline. Encouraged increasing activity level and walking a few times per day with RN to improve mobility/endurance. Pt does not require skilled therapy services. Discharge from therapy.    Follow Up Recommendations No PT follow up;Supervision - Intermittent    Equipment Recommendations  None recommended by PT    Recommendations for Other Services       Precautions / Restrictions Precautions Precautions: None Precaution Comments: soft BP Restrictions Weight Bearing Restrictions: No      Mobility  Bed Mobility Overal bed mobility: Modified Independent             General bed mobility comments: No assist needed, use of rail.  Transfers Overall transfer level: Modified independent Equipment used: None             General transfer comment: Stood from EOB x1, transferred to chair post ambulation.   Ambulation/Gait Ambulation/Gait assistance: Modified independent (Device/Increase time) Ambulation Distance (Feet): 300 Feet Assistive device: None Gait Pattern/deviations: Step-through pattern   Gait velocity interpretation: at or above normal speed for age/gender General Gait Details: Steady gait. 2-3/4 DOE. VSS. Appropriate BP response to exercise, pre SBP 101, post 138. No LOB.  Stairs            Wheelchair Mobility    Modified Rankin (Stroke Patients Only)       Balance Overall balance assessment: Needs assistance Sitting-balance  support: Feet supported;No upper extremity supported Sitting balance-Leahy Scale: Good     Standing balance support: During functional activity Standing balance-Leahy Scale: Good                               Pertinent Vitals/Pain Pain Assessment: No/denies pain    Home Living Family/patient expects to be discharged to:: Private residence Living Arrangements: Spouse/significant other Available Help at Discharge: Family;Available PRN/intermittently Type of Home: Apartment Home Access: Level entry     Home Layout: One level Home Equipment: None      Prior Function Level of Independence: Independent         Comments: Loves to bake. Drives.      Hand Dominance   Dominant Hand: Right    Extremity/Trunk Assessment   Upper Extremity Assessment Upper Extremity Assessment: Defer to OT evaluation    Lower Extremity Assessment Lower Extremity Assessment: Overall WFL for tasks assessed    Cervical / Trunk Assessment Cervical / Trunk Assessment: Normal  Communication   Communication: No difficulties  Cognition Arousal/Alertness: Awake/alert Behavior During Therapy: WFL for tasks assessed/performed Overall Cognitive Status: Within Functional Limits for tasks assessed                                        General Comments General comments (skin integrity, edema, etc.): VSS throughout.    Exercises     Assessment/Plan    PT Assessment Patent does not need any further  PT services  PT Problem List         PT Treatment Interventions      PT Goals (Current goals can be found in the Care Plan section)  Acute Rehab PT Goals Patient Stated Goal: to go home PT Goal Formulation: All assessment and education complete, DC therapy    Frequency     Barriers to discharge        Co-evaluation               AM-PAC PT "6 Clicks" Daily Activity  Outcome Measure Difficulty turning over in bed (including adjusting bedclothes,  sheets and blankets)?: None Difficulty moving from lying on back to sitting on the side of the bed? : None Difficulty sitting down on and standing up from a chair with arms (e.g., wheelchair, bedside commode, etc,.)?: None Help needed moving to and from a bed to chair (including a wheelchair)?: None Help needed walking in hospital room?: None Help needed climbing 3-5 steps with a railing? : None 6 Click Score: 24    End of Session   Activity Tolerance: Patient tolerated treatment well Patient left: in chair;with call bell/phone within reach Nurse Communication: Mobility status PT Visit Diagnosis: Muscle weakness (generalized) (M62.81)    Time: 1624-4695 PT Time Calculation (min) (ACUTE ONLY): 18 min   Charges:   PT Evaluation $PT Eval Moderate Complexity: 1 Mod     PT G CodesMylo Red, PT, DPT [phone]    Christopher [name] 03/31/2017, 10:13 AM

## 2017-03-31 NOTE — Progress Notes (Signed)
ANTICOAGULATION CONSULT NOTE - Follow Up Consult  Pharmacy Consult for Heparin Indication: atrial fibrillation/flutter  Allergies  Allergen Reactions  . Losartan Shortness Of Breath  . Elita Quick [Ceftazidime] Rash    Rash with blisters  . Lisinopril Rash    Patient Measurements: Height: 5\' 10"  (177.8 cm) Weight: 272 lb 14.9 oz (123.8 kg) IBW/kg (Calculated) : 73 Heparin Dosing Weight: 101 kg  Vital Signs: Temp: 97.8 F (36.6 C) (08/24 0727) Temp Source: Oral (08/24 0727) BP: 119/89 (08/24 0727) Pulse Rate: 81 (08/24 0727)  Labs:  Recent Labs  03/28/17 1137  03/28/17 1956 03/29/17 0304 03/30/17 0343 03/30/17 1517 03/31/17 0329  HGB 16.2  --   --  13.0 12.3*  --  11.4*  HCT 47.3  --   --  38.7* 36.7*  --  34.2*  PLT 223  --   --  173 151  --  126*  APTT  --   < > 33 83* 60* 92* 88*  HEPARINUNFRC  --   < > >2.20* >2.20* >2.20*  --  1.58*  CREATININE  --   < > 3.38* 3.72* 3.87*  --  3.35*  TROPONINI 0.04*  --  0.38* 0.53*  --   --   --   < > = values in this interval not displayed.  Estimated Creatinine Clearance: 32.9 mL/min (A) (by C-G formula based on SCr of 3.35 mg/dL (H)).   Medications:  Heparin @ 1400 units/hr  Assessment: 56yom on eliquis pta for afib, transitioned to heparin in the setting of renal failure (last eliquis dose 8/21). Using APTTs to guide heparin dosing as Eliquis falsely elevates heparin levels. APTT is now therapeutic at 88 seconds.  Heparin levels remain falsely elevated, although trending down.  Goal of Therapy:  APTT 66-102 seconds Heparin level 0.3-0.7 units/ml Monitor platelets by anticoagulation protocol: Yes   Plan:  1) Continue heparin at 1400 units/hr 2) Follow up daily heparin level, APTT, CBC  Tad Moore, BCPS  Clinical Pharmacist Pager [phone]  03/31/2017 11:30 AM

## 2017-04-01 DIAGNOSIS — E876 Hypokalemia: Secondary | ICD-10-CM

## 2017-04-01 DIAGNOSIS — I480 Paroxysmal atrial fibrillation: Secondary | ICD-10-CM

## 2017-04-01 LAB — CBC
HEMATOCRIT: 32.4 % — AB (ref 39.0–52.0)
Hemoglobin: 10.7 g/dL — ABNORMAL LOW (ref 13.0–17.0)
MCH: 31.5 pg (ref 26.0–34.0)
MCHC: 33 g/dL (ref 30.0–36.0)
MCV: 95.3 fL (ref 78.0–100.0)
Platelets: 111 10*3/uL — ABNORMAL LOW (ref 150–400)
RBC: 3.4 MIL/uL — ABNORMAL LOW (ref 4.22–5.81)
RDW: 16.7 % — AB (ref 11.5–15.5)
WBC: 6.3 10*3/uL (ref 4.0–10.5)

## 2017-04-01 LAB — BASIC METABOLIC PANEL
ANION GAP: 10 (ref 5–15)
BUN: 40 mg/dL — ABNORMAL HIGH (ref 6–20)
CO2: 24 mmol/L (ref 22–32)
Calcium: 8.4 mg/dL — ABNORMAL LOW (ref 8.9–10.3)
Chloride: 102 mmol/L (ref 101–111)
Creatinine, Ser: 2.8 mg/dL — ABNORMAL HIGH (ref 0.61–1.24)
GFR calc Af Amer: 28 mL/min — ABNORMAL LOW (ref >60)
GFR, EST NON AFRICAN AMERICAN: 24 mL/min — AB (ref >60)
GLUCOSE: 105 mg/dL — AB (ref 65–99)
POTASSIUM: 3.7 mmol/L (ref 3.5–5.1)
Sodium: 136 mmol/L (ref 135–145)

## 2017-04-01 LAB — GLUCOSE, CAPILLARY
GLUCOSE-CAPILLARY: 145 mg/dL — AB (ref 65–99)
GLUCOSE-CAPILLARY: 137 mg/dL — AB (ref 65–99)
Glucose-Capillary: 104 mg/dL — ABNORMAL HIGH (ref 65–99)
Glucose-Capillary: 125 mg/dL — ABNORMAL HIGH (ref 65–99)
Glucose-Capillary: 104 mg/dL — ABNORMAL HIGH (ref 65–99)

## 2017-04-01 LAB — HEPARIN LEVEL (UNFRACTIONATED): Heparin Unfractionated: 0.75 IU/mL — ABNORMAL HIGH (ref 0.30–0.70)

## 2017-04-01 LAB — MAGNESIUM: MAGNESIUM: 2.2 mg/dL (ref 1.7–2.4)

## 2017-04-01 LAB — APTT: aPTT: 69 seconds — ABNORMAL HIGH (ref 24–36)

## 2017-04-01 NOTE — Progress Notes (Signed)
Patients O2 while sleeping drops to 86%. Mostly fluctuating between 87-91%  Offered patient Christopher [name] 1L while sleeping, but states, "I don't wear oxygen at home. I do not need it and I don't like it. I'll wear it for a little bit and then take it off." Education given to patient and aware of the importance of wearing O2 while he sleeps. RN will continue to monitor patient.

## 2017-04-01 NOTE — Progress Notes (Signed)
Progress Note  Patient Name: Christopher [name] Date of Encounter: 04/01/2017  Primary Cardiologist:   Dr. Shirlee Latch  Subjective   He says that he feels fine and he was hoping to go home this weekend.  He walked in the unit.   He denies chest pain or SOB.  Inpatient Medications    Scheduled Meds: . carvedilol  3.125 mg Oral BID WC  . DULoxetine  30 mg Oral Daily  . famotidine  20 mg Oral Daily  . gabapentin  300 mg Oral BID  . hydrALAZINE  25 mg Oral Q8H  . insulin aspart  0-9 Units Subcutaneous Q4H  . isosorbide mononitrate  30 mg Oral Daily  . QUEtiapine  25 mg Oral QHS  . rosuvastatin  40 mg Oral Daily   Continuous Infusions: . sodium chloride    . aztreonam Stopped (04/01/17 7412)  . heparin 1,400 Units/hr (03/31/17 2312)  . nitroGLYCERIN Stopped (03/29/17 1559)  . vancomycin Stopped (03/31/17 1851)   PRN Meds: sodium chloride, albuterol, hydrALAZINE, ipratropium-albuterol, promethazine   Vital Signs    Vitals:   04/01/17 0100 04/01/17 0403 04/01/17 0415 04/01/17 0848  BP:   118/71 130/84  Pulse: 62  63   Resp: 14  12 14   Temp:   98 F (36.7 C) 97.7 F (36.5 C)  TempSrc:   Oral Oral  SpO2: (!) 89%  94%   Weight:  279 lb 15.8 oz (127 kg)    Height:        Intake/Output Summary (Last 24 hours) at 04/01/17 1212 Last data filed at 04/01/17 0900  Gross per 24 hour  Intake             1190 ml  Output             1525 ml  Net             -335 ml   Filed Weights   03/30/17 0426 03/31/17 0341 04/01/17 0403  Weight: 281 lb 8.4 oz (127.7 kg) 272 lb 14.9 oz (123.8 kg) 279 lb 15.8 oz (127 kg)    Telemetry    Atrial flutter with variable conduction. - Personally Reviewed  ECG    NA - Personally Reviewed  Physical Exam   GEN: No acute distress.   Neck: No  JVD Cardiac: RRR, no murmurs, rubs, or gallops.  Respiratory: Clear  to auscultation bilaterally. GI: Soft, nontender, non-distended  MS: No  edema; No deformity. Neuro:  Nonfocal  Psych: Normal  affect   Labs    Chemistry Recent Labs Lab 03/28/17 1315  03/30/17 0343 03/31/17 0329 04/01/17 0351  NA 139  < > 138 139 136  K 5.3*  < > 3.3* 3.4* 3.7  CL 106  < > 103 101 102  CO2 16*  < > 22 25 24   GLUCOSE 296*  < > 130* 108* 105*  BUN 24*  < > 49* 47* 40*  CREATININE 2.86*  < > 3.87* 3.35* 2.80*  CALCIUM 9.3  < > 8.5* 8.4* 8.4*  PROT 7.8  --   --  6.7  --   ALBUMIN 4.3  --   --  3.4*  --   AST 33  --   --  37  --   ALT 23  --   --  21  --   ALKPHOS 105  --   --  63  --   BILITOT 1.2  --   --  1.1  --  GFRNONAA 23*  < > 16* 19* 24*  GFRAA 27*  < > 19* 22* 28*  ANIONGAP 17*  < > 13 13 10   < > = values in this interval not displayed.   Hematology Recent Labs Lab 03/30/17 0343 03/31/17 0329 04/01/17 0351  WBC 12.0* 8.7 6.3  RBC 3.88* 3.63* 3.40*  HGB 12.3* 11.4* 10.7*  HCT 36.7* 34.2* 32.4*  MCV 94.6 94.2 95.3  MCH 31.7 31.4 31.5  MCHC 33.5 33.3 33.0  RDW 17.2* 17.0* 16.7*  PLT 151 126* 111*    Cardiac Enzymes Recent Labs Lab 03/28/17 1137 03/28/17 1956 03/29/17 0304  TROPONINI 0.04* 0.38* 0.53*   No results for input(s): TROPIPOC in the last 168 hours.   BNP Recent Labs Lab 03/28/17 1137  BNP 1,836.9*     DDimer No results for input(s): DDIMER in the last 168 hours.   Radiology    No results found.  Cardiac Studies   NA  Patient Profile     56 y.o. male with a history of CAD, CABG x4 and R CEA 2017, ICM, chronic systolic heart failure, and A flutter admitted with acute respiratory failure and a/c systolic heart failure.   Assessment & Plan    ACUTE ON CHRONIC SYSTOLIC HF:   Entresto, spironolactone on hold secondary to AKI.  Lower dose Lasix and beta blocker currently.  On hydral and isosorbide.  Continue current therapy.    CAD:  Elevated troponin.  Demand ischemia.  Continue medical management  ATRIAL FLUTTER:  Rate controlled.  Continue amiodarone.  TEE/DCCV is planned.  This is tentatively planned for Monday but I don't see that  this has been scheduled yet.  He actually does not want to have this done.  I will have him talk to Dr. Shirlee Latch about this.  We could consider out patient cardioversion after he has been back on the Eliquis for 3 weeks.    CKD STAGE III WITH AKI:   Creat is trending down.    Signed, Rollene Rotunda, MD  04/01/2017, 12:12 PM

## 2017-04-01 NOTE — Progress Notes (Signed)
1700-Received transfer from , alert and oriented,wife at bedside.Heparin drip infusing @ 54ml/hr. No bleeding noted. Will monitor.

## 2017-04-01 NOTE — Progress Notes (Signed)
PROGRESS NOTE    Christopher [name]  WUJ:[id number] DOB: Mar 25, 1961 DOA: 03/28/2017 PCP: Shirlean Mylar, MD   Brief Narrative:  56 year old WM PMHx HTN, Chronic ombined Systolic and Diastolic CHF (EF 95-62%) and follows Dr. Shirlee Latch, HTN, CAD S/P CABG 4, TIA, COPD, Neuropathy on high-dose Neurontin, was recently seen in the ED on 8/10 for feeling 'terrible', nauseous, palpitations and poor by mouth intake. Patient was noted to have a acute renal failure and IV fluid bolus was administered. Patient's creatinine continued to rise despite IV fluid hydration. Nephrology and cardiology were consulted during the admission. Cardiology planned to restart lower dosed torsemide on 8/19 as well as low-dose Coreg twice daily if his blood pressure tolerated.    On the day of discharge (8/17) the patient's blood pressure was 100/61 and he was saturating 98% with a heart rate of 55 bpm. patient reports his dyspnea started abruptly today. Does endorse some subjective fever as well as hot and cold chills. No audible wheezing per patient and family at bedside but he has had a mild nonproductive cough. No chest pain, pressure, or tightness. Denies any sore throat or sinus congestion. No abdominal pain, nausea, or vomiting. He has had some mild diarrhea. Patient reports he has been taking his Coreg and diuretics as prescribed. He has also been taking his systemic anticoagulation. In the emergency department he was started on a nitroglycerin drip for his hypertension and given 2 doses of Lasix 80 mg IV. Patient has had minimal urine output.      Subjective: 8/25 A/O 4, negative CP, negative SOB, negative N/V, negative abdominal pain. States is unsure he wants to go through with TEE/DCCV.   Assessment & Plan:   Active Problems:   Acute respiratory failure with hypoxia (HCC)   Acute pulmonary edema (HCC)   Hypertensive emergency   Acute Respiratory Failure with Hypoxia/HCAP -Resolved   -Titrate O2 to maintain SPO2  89-93% -DuoNeb PRN -Complete 7 day course antibiotics    Acute on CKD stage 3 (Baseline appears to be 1.7-2.0   Recent Labs Lab 03/28/17 1315 03/28/17 1956 03/29/17 0304 03/30/17 0343 03/31/17 0329 04/01/17 0351  CREATININE 2.86* 3.38* 3.72* 3.87* 3.35* 2.80*  -Per previous admission note believed possible overmedication cause of acute renal failure (Neurontin, Cymbalta, tramadol, Aldactone, Torsemide, Entresto), -Patient discharged on 8/17 had been seen by Dr. Hyman Hopes nephrology during that visit. Per his note was awaiting serology (HIV, SPEP/UPEP). -Creatinine beginning to improve -8/16 Renal ultrasound: Negative -8/16 SPEP: Unremarkable -8/17 HIV negative -Nephrology aware patient rehospitalized will arrange for early follow-up   Chronic systolic CHF (congestive heart failure) (HCC) - Hold Restoril, Aldactone, torsemide, patient not eating and drinking. - Cardiology plans on TEE/DCC V on Monday. Patient very apprehensive about procedure. Constipation to speak with cardiology and voice his concerns. -Strict in and out since admission  +1.4  L -Daily weight Filed Weights   03/31/17 0341 04/01/17 0403 04/01/17 1700  Weight: 272 lb 14.9 oz (123.8 kg) 279 lb 15.8 oz (127 kg) 271 lb 9.6 oz (123.2 kg)  -Coreg 3.125 mg BID -Hydralazine 25 mg TID -Imdur 30 mg daily -Diureses per cardiology   Paroxysmal Atrial flutter -At time of exam NSR--> A. fib/flutter -Due to patient's waxing& waning /worsening creatinine Coumadin will be choice for long-term anticoagulation. Spoken with Nephrology and Pharmacy and they concur.   Essential hypertension - Currently BP on the softer side. -Hold BP medication   COPD - Currently stable, no wheezing   CAD involving native coronary  artery of native heart without angina pectoris -Currently stable, no chest pain, hold all cardiac medications   Hypokalemia -Potassium goal> 4   Hypomagnesemia -Magnesium goal> 2    DVT prophylaxis: Heparin  drip Code Status: Full Family Communication: Wife at Bedside for discussion of plan of care Disposition Plan: Per cardiology   Consultants:  Cardiology    Procedures/Significant Events:  10/26/15 PFT: FVC 3.46 L (68%] the FEV1 2.59 L (66%) FEV1/FVC 0.75 FEF 25-75 1.91 L (57%) negative bronchodilator response 11/02/15 RHC:  RA mean: 19  RV: 44/23  PA: 43/25 (33)  Pulse Later wedge pressure mean: 26  Aortic pressure: 124/71  PA saturation: 64%  Aortic saturation: 100%    7/18 Echocardiogram:  LVEF 40-45% with diffuse hypokinesis & akinesis of the apical myocardium.  -Grade 2 diastolic dysfunction. 8/16 RENAL U/S:  Negative renal ultrasound. 8/17 - Discharge from hospital after admission with acute renal failure >> diuretics held 8/21 PCXR:  Compared with previous x-ray from 8/10. Patient demonstrates significant increase in bilateral interstitial markings consistent with pulmonary edema as well as silhouetting of bilateral hemidiaphragms with lower lung hazy opacification consistent with pleural effusions. No consolidation appreciated or air bronchograms. Mediastinum otherwise normal in contour. 8/21 - Presented from heart failure clinic with hypoxic respiratory failure 8/22 PCXR: Continued pulmonary edema mild improvement    I have personally reviewed and interpreted all radiology studies and my findings are as above.  VENTILATOR SETTINGS: None   Cultures 8/17 HIV negative  8/21 blood NGTD 8/22 sputum unacceptable   Antimicrobials: Anti-infectives    Start     Stop   03/31/17 1700  vancomycin (VANCOCIN) 1,500 mg in sodium chloride 0.9 % 500 mL IVPB         03/30/17 1800  levofloxacin (LEVAQUIN) IVPB 500 mg  Status:  Discontinued     03/30/17 0855   03/30/17 0900  aztreonam (AZACTAM) 1 g in dextrose 5 % 50 mL IVPB         03/29/17 1800  vancomycin (VANCOCIN) 1,500 mg in sodium chloride 0.9 % 500 mL IVPB  Status:  Discontinued     03/30/17 0724   03/28/17  1700  aztreonam (AZACTAM) 2 g in dextrose 5 % 50 mL IVPB  Status:  Discontinued     03/30/17 0724   03/28/17 1700  levofloxacin (LEVAQUIN) IVPB 750 mg  Status:  Discontinued     03/30/17 0724   03/28/17 1645  vancomycin (VANCOCIN) 2,500 mg in sodium chloride 0.9 % 500 mL IVPB     03/28/17 2303       Devices    LINES / TUBES:      Continuous Infusions: . sodium chloride    . aztreonam Stopped (04/01/17 6301)  . heparin 1,500 Units/hr (04/01/17 1500)  . nitroGLYCERIN Stopped (03/29/17 1559)  . vancomycin Stopped (03/31/17 1851)     Objective: Vitals:   04/01/17 1233 04/01/17 1300 04/01/17 1400 04/01/17 1500  BP: 128/86     Pulse: (!) 59 (!) 43 62 62  Resp: 16 14 10 14   Temp: 97.7 F (36.5 C)     TempSrc: Oral     SpO2: 97% 96% 95% 95%  Weight:      Height:        Intake/Output Summary (Last 24 hours) at 04/01/17 1624 Last data filed at 04/01/17 1500  Gross per 24 hour  Intake          1671.13 ml  Output  1625 ml  Net            46.13 ml   Filed Weights   03/30/17 0426 03/31/17 0341 04/01/17 0403  Weight: 281 lb 8.4 oz (127.7 kg) 272 lb 14.9 oz (123.8 kg) 279 lb 15.8 oz (127 kg)    Examination:  General: A/O 4, No acute respiratory distress Neck:  Negative scars, masses, torticollis, lymphadenopathy, JVD Lungs: Clear to auscultation bilaterally without wheezes or crackles Cardiovascular: Regular rate and rhythm without murmur gallop or rub normal S1 and S2 Abdomen: negative abdominal pain, nondistended, positive soft, bowel sounds, no rebound, no ascites, no appreciable mass Extremities: No significant cyanosis, clubbing, or edema bilateral lower extremities Skin: Negative rashes, lesions, ulcers Psychiatric:  Negative depression, positive anxiety, negative fatigue, negative mania  Central nervous system:  Cranial nerves II through XII intact, tongue/uvula midline, all extremities muscle strength 5/5, sensation intact throughout,  negative  dysarthria, negative expressive aphasia, negative receptive aphasia.  .     Data Reviewed: Care during the described time interval was provided by me .  I have reviewed this patient's available data, including medical history, events of note, physical examination, and all test results as part of my evaluation.   CBC:  Recent Labs Lab 03/28/17 1137 03/29/17 0304 03/30/17 0343 03/31/17 0329 04/01/17 0351  WBC 15.3* 16.1* 12.0* 8.7 6.3  NEUTROABS 9.9*  --   --   --   --   HGB 16.2 13.0 12.3* 11.4* 10.7*  HCT 47.3 38.7* 36.7* 34.2* 32.4*  MCV 95.7 93.5 94.6 94.2 95.3  PLT 223 173 151 126* 111*   Basic Metabolic Panel:  Recent Labs Lab 03/28/17 1956 03/29/17 0304 03/30/17 0343 03/31/17 0329 04/01/17 0351  NA 139 139 138 139 136  K 5.0 4.2 3.3* 3.4* 3.7  CL 107 106 103 101 102  CO2 18* 18* 22 25 24   GLUCOSE 124* 143* 130* 108* 105*  BUN 28* 35* 49* 47* 40*  CREATININE 3.38* 3.72* 3.87* 3.35* 2.80*  CALCIUM 9.1 8.8* 8.5* 8.4* 8.4*  MG  --  1.8 1.8 2.1 2.2  PHOS  --  2.2* 2.2*  --   --    GFR: Estimated Creatinine Clearance: 39.9 mL/min (A) (by C-G formula based on SCr of 2.8 mg/dL (H)). Liver Function Tests:  Recent Labs Lab 03/28/17 1315 03/31/17 0329  AST 33 37  ALT 23 21  ALKPHOS 105 63  BILITOT 1.2 1.1  PROT 7.8 6.7  ALBUMIN 4.3 3.4*   No results for input(s): LIPASE, AMYLASE in the last 168 hours. No results for input(s): AMMONIA in the last 168 hours. Coagulation Profile: No results for input(s): INR, PROTIME in the last 168 hours. Cardiac Enzymes:  Recent Labs Lab 03/28/17 1137 03/28/17 1956 03/29/17 0304  TROPONINI 0.04* 0.38* 0.53*   BNP (last 3 results) No results for input(s): PROBNP in the last 8760 hours. HbA1C: No results for input(s): HGBA1C in the last 72 hours. CBG:  Recent Labs Lab 03/31/17 2035 03/31/17 2340 04/01/17 0418 04/01/17 0847 04/01/17 1128  GLUCAP 122* 102* 104* 125* 145*   Lipid Profile: No results for  input(s): CHOL, HDL, LDLCALC, TRIG, CHOLHDL, LDLDIRECT in the last 72 hours. Thyroid Function Tests: No results for input(s): TSH, T4TOTAL, FREET4, T3FREE, THYROIDAB in the last 72 hours. Anemia Panel: No results for input(s): VITAMINB12, FOLATE, FERRITIN, TIBC, IRON, RETICCTPCT in the last 72 hours. Urine analysis:    Component Value Date/Time   COLORURINE YELLOW 03/28/2017 1404   APPEARANCEUR HAZY (  A) 03/28/2017 1404   LABSPEC 1.011 03/28/2017 1404   PHURINE 5.0 03/28/2017 1404   GLUCOSEU NEGATIVE 03/28/2017 1404   HGBUR SMALL (A) 03/28/2017 1404   BILIRUBINUR NEGATIVE 03/28/2017 1404   KETONESUR NEGATIVE 03/28/2017 1404   PROTEINUR 100 (A) 03/28/2017 1404   NITRITE NEGATIVE 03/28/2017 1404   LEUKOCYTESUR NEGATIVE 03/28/2017 1404   Sepsis Labs: @LABRCNTIP (procalcitonin:4,lacticidven:4)  ) Recent Results (from the past 240 hour(s))  Culture, blood (routine x 2)     Status: None (Preliminary result)   Collection Time: 03/28/17  4:47 PM  Result Value Ref Range Status   Specimen Description BLOOD RIGHT WRIST  Final   Special Requests   Final    BOTTLES DRAWN AEROBIC ONLY Blood Culture adequate volume   Culture NO GROWTH 4 DAYS  Final   Report Status PENDING  Incomplete  Culture, blood (routine x 2)     Status: None (Preliminary result)   Collection Time: 03/28/17  4:54 PM  Result Value Ref Range Status   Specimen Description BLOOD RIGHT HAND  Final   Special Requests   Final    BOTTLES DRAWN AEROBIC ONLY Blood Culture adequate volume   Culture NO GROWTH 4 DAYS  Final   Report Status PENDING  Incomplete  MRSA PCR Screening     Status: None   Collection Time: 03/28/17  5:45 PM  Result Value Ref Range Status   MRSA by PCR NEGATIVE NEGATIVE Final    Comment:        The GeneXpert MRSA Assay (FDA approved for NASAL specimens only), is one component of a comprehensive MRSA colonization surveillance program. It is not intended to diagnose MRSA infection nor to guide  or monitor treatment for MRSA infections.   Culture, expectorated sputum-assessment     Status: None   Collection Time: 03/29/17  3:05 PM  Result Value Ref Range Status   Specimen Description SPUTUM  Final   Special Requests Normal  Final   Sputum evaluation   Final    Sputum specimen not acceptable for testing.  Please recollect.   Gram Stain Report Called to,Read Back By and Verified With: S TOTTLE RN AT 1645 ON [id number] BY SJW    Report Status 03/29/2017 FINAL  Final         Radiology Studies: No results found.      Scheduled Meds: . carvedilol  3.125 mg Oral BID WC  . DULoxetine  30 mg Oral Daily  . famotidine  20 mg Oral Daily  . gabapentin  300 mg Oral BID  . hydrALAZINE  25 mg Oral Q8H  . insulin aspart  0-9 Units Subcutaneous Q4H  . isosorbide mononitrate  30 mg Oral Daily  . QUEtiapine  25 mg Oral QHS  . rosuvastatin  40 mg Oral Daily   Continuous Infusions: . sodium chloride    . aztreonam Stopped (04/01/17 0454)  . heparin 1,500 Units/hr (04/01/17 1500)  . nitroGLYCERIN Stopped (03/29/17 1559)  . vancomycin Stopped (03/31/17 1851)     LOS: 4 days    Time spent: 40 minutes    Sarely Stracener, Roselind Messier, MD [facility] Pager [phone]   If 7PM-7AM, please contact night-coverage [email or web address] Password [email or web address] 04/01/2017, 4:24 PM

## 2017-04-01 NOTE — Progress Notes (Signed)
Transferred pt to 3E13 at this time.  Pt has no s/s of any acute distress.  Report was given to [facility].

## 2017-04-01 NOTE — Progress Notes (Signed)
Report given to Grace,RN.

## 2017-04-01 NOTE — Progress Notes (Signed)
ANTICOAGULATION CONSULT NOTE - Follow Up Consult  Pharmacy Consult for Heparin Indication: atrial fibrillation/flutter  Allergies  Allergen Reactions  . Losartan Shortness Of Breath  . Elita Quick [Ceftazidime] Rash    Rash with blisters  . Lisinopril Rash    Patient Measurements: Height: 5\' 10"  (177.8 cm) Weight: 279 lb 15.8 oz (127 kg) IBW/kg (Calculated) : 73 Heparin Dosing Weight: 101 kg  Vital Signs: Temp: 97.7 F (36.5 C) (08/25 0848) Temp Source: Oral (08/25 0848) BP: 130/84 (08/25 0848) Pulse Rate: 63 (08/25 0415)  Labs:  Recent Labs  03/30/17 0343 03/30/17 1517 03/31/17 0329 04/01/17 0351  HGB 12.3*  --  11.4* 10.7*  HCT 36.7*  --  34.2* 32.4*  PLT 151  --  126* 111*  APTT 60* 92* 88* 69*  HEPARINUNFRC >2.20*  --  1.58* 0.75*  CREATININE 3.87*  --  3.35* 2.80*    Estimated Creatinine Clearance: 39.9 mL/min (A) (by C-G formula based on SCr of 2.8 mg/dL (H)).   Medications:  Heparin @ 1400 units/hr  Assessment: 56yom on Eliquis pta for afib, transitioned to heparin in the setting of renal failure (last eliquis dose 8/21).  Using APTTs to guide heparin dosing as Eliquis falsely elevates heparin levels. APTT remains therapeutic at 69 seconds, though on the lower end of range.  Heparin levels remain falsely elevated, although trending down- anticipate aPTT and heparin level to correlate soon.  Hgb 10.7, plts 111- both fell, though no bleeding noted. Continue to watch.  SCr improved to 2.8 this morning.  Goal of Therapy:  APTT 66-102 seconds Heparin level 0.3-0.7 units/ml Monitor platelets by anticoagulation protocol: Yes   Plan:  Increase heparin slightly to 1500 units/hr Daily heparin level, APTT, and CBC Follow closely for s/s bleeding Follow plans for cardioversion and resumption of Eliquis  Evelin Cake D. Iviona Hole, PharmD, BCPS Clinical Pharmacist Pager: [phone] Clinical Phone for 04/01/2017 until 3:30pm: [phone] If after 3:30pm, please call main  pharmacy at [phone] 04/01/2017 11:51 AM

## 2017-04-02 ENCOUNTER — Inpatient Hospital Stay (HOSPITAL_COMMUNITY): Payer: Medicaid Other

## 2017-04-02 DIAGNOSIS — N183 Chronic kidney disease, stage 3 unspecified: Secondary | ICD-10-CM

## 2017-04-02 DIAGNOSIS — Z951 Presence of aortocoronary bypass graft: Secondary | ICD-10-CM

## 2017-04-02 DIAGNOSIS — I4892 Unspecified atrial flutter: Secondary | ICD-10-CM

## 2017-04-02 LAB — BASIC METABOLIC PANEL
Anion gap: 10 (ref 5–15)
BUN: 36 mg/dL — AB (ref 6–20)
CHLORIDE: 103 mmol/L (ref 101–111)
CO2: 24 mmol/L (ref 22–32)
CREATININE: 2.6 mg/dL — AB (ref 0.61–1.24)
Calcium: 8.8 mg/dL — ABNORMAL LOW (ref 8.9–10.3)
GFR, EST AFRICAN AMERICAN: 30 mL/min — AB (ref >60)
GFR, EST NON AFRICAN AMERICAN: 26 mL/min — AB (ref >60)
Glucose, Bld: 98 mg/dL (ref 65–99)
POTASSIUM: 3.3 mmol/L — AB (ref 3.5–5.1)
SODIUM: 137 mmol/L (ref 135–145)

## 2017-04-02 LAB — CBC
HCT: 32.1 % — ABNORMAL LOW (ref 39.0–52.0)
HEMOGLOBIN: 10.5 g/dL — AB (ref 13.0–17.0)
MCH: 31.6 pg (ref 26.0–34.0)
MCHC: 32.7 g/dL (ref 30.0–36.0)
MCV: 96.7 fL (ref 78.0–100.0)
PLATELETS: 117 10*3/uL — AB (ref 150–400)
RBC: 3.32 MIL/uL — AB (ref 4.22–5.81)
RDW: 17 % — ABNORMAL HIGH (ref 11.5–15.5)
WBC: 6.9 10*3/uL (ref 4.0–10.5)

## 2017-04-02 LAB — APTT: APTT: 67 s — AB (ref 24–36)

## 2017-04-02 LAB — GLUCOSE, CAPILLARY
GLUCOSE-CAPILLARY: 113 mg/dL — AB (ref 65–99)
GLUCOSE-CAPILLARY: 103 mg/dL — AB (ref 65–99)
GLUCOSE-CAPILLARY: 110 mg/dL — AB (ref 65–99)
Glucose-Capillary: 102 mg/dL — ABNORMAL HIGH (ref 65–99)

## 2017-04-02 LAB — CULTURE, BLOOD (ROUTINE X 2)
CULTURE: NO GROWTH
Culture: NO GROWTH
SPECIAL REQUESTS: ADEQUATE
SPECIAL REQUESTS: ADEQUATE

## 2017-04-02 LAB — HEPARIN LEVEL (UNFRACTIONATED): Heparin Unfractionated: 0.54 IU/mL (ref 0.30–0.70)

## 2017-04-02 LAB — MAGNESIUM: MAGNESIUM: 2.3 mg/dL (ref 1.7–2.4)

## 2017-04-02 MED ORDER — POTASSIUM CHLORIDE CRYS ER 20 MEQ PO TBCR
40.0000 meq | EXTENDED_RELEASE_TABLET | Freq: Once | ORAL | Status: AC
Start: 2017-04-02 — End: 2017-04-02
  Administered 2017-04-02: 40 meq via ORAL
  Filled 2017-04-02: qty 2

## 2017-04-02 MED ORDER — DOXYCYCLINE HYCLATE 100 MG PO TABS
100.0000 mg | ORAL_TABLET | Freq: Two times a day (BID) | ORAL | Status: DC
  Administered 2017-04-02 – 2017-04-03 (×3): 100 mg via ORAL
  Filled 2017-04-02 (×3): qty 1

## 2017-04-02 MED ORDER — APIXABAN 5 MG PO TABS
5.0000 mg | ORAL_TABLET | Freq: Two times a day (BID) | ORAL | Status: DC
  Administered 2017-04-02 – 2017-04-03 (×2): 5 mg via ORAL
  Filled 2017-04-02 (×2): qty 1

## 2017-04-02 NOTE — Progress Notes (Signed)
PROGRESS NOTE    Christopher [name]   ZOX:[id number]  DOB: 1961-04-16  DOA: 03/28/2017 PCP: Shirlean Mylar, MD   Brief Narrative:  Christopher [name] is a 56 y/o male with CAD s/o CABG x 4,  Chronic d and sCHF (ICM), CKD 3,  R CEA, A-flutter on Eliquis who presents to the ER for dyspnea and is admitted for to the ICU for HCAP, HTN emergency, AKI and acute sCHF.   Started on BiPAP, Nitro infusion, given IV Lasix and Vanc, Azactam and Levaquin.  CXR>> b/l diffuse infiltrates     He was admitted from 8/16-8/17 with AKI. He was recommended to hold Entresto and Aldactone and was cut back on Coreg and placed on a low dose of Torsemide. Asked to f/u at CHF clinic but ended up in the hospital instead.    Subjective: Cough and dyspnea improved. No other complaints.  ROS: no complaints of nausea, vomiting, constipation diarrhea, cough, dyspnea or dysuria. No other complaints.   Assessment & Plan:   Principal Problem:   Acute respiratory failure with hypoxia- required BiPAP  (A) Acute on chronic combined systolic and diastolic congestive heart failure - cardiology suspects flash pulm edema due to HTN emergency- diuretics per cardiology - weight 124 kg> 122 - I and O inaccurate  - CXR today shows resolution  (B) HCAP, Sepsis - WBC 16,  Procalcitonin 16 on 8/22 - LA normal - Vanc/ Azactam- started on 8/21-  - MRSA PCR, strep pneumo and legionella negative - Change Vanc to Doxy oral - cont Azactam for 1 more day -  CXR today is clear    Atrial flutter with rapid ventricular response  - on Eliquis at home- now heparin infusion - plan for DCCV on Monday per notes- patient states Dr Shirlee Latch did not discuss this with him and he will not agree to it until he talks to him    Hypertensive emergency - resolved - Coreg, Hydralazine, Imdur     CAD S/P CABG x 4 - statin and anticoagulation    Acute kidney injury on CKD 3 - Cr 3.38 on admission - 2.60 now - baseline in 1.6-2.2  Acute  thrombocytopenia - platelets 173 on 8/22 - now 117 - follow   DVT prophylaxis: heparin infusion Code Status: Full code Family Communication:  Disposition Plan: home when stable Consultants:   cardiology Procedures:    Antimicrobials:  Anti-infectives    Start     Dose/Rate Route Frequency Ordered Stop   03/31/17 1700  vancomycin (VANCOCIN) 1,500 mg in sodium chloride 0.9 % 500 mL IVPB     1,500 mg 250 mL/hr over 120 Minutes Intravenous Every 48 hours 03/30/17 0724     03/30/17 1800  levofloxacin (LEVAQUIN) IVPB 500 mg  Status:  Discontinued     500 mg 100 mL/hr over 60 Minutes Intravenous Every 48 hours 03/30/17 0724 03/30/17 0855   03/30/17 0900  aztreonam (AZACTAM) 1 g in dextrose 5 % 50 mL IVPB     1 g 100 mL/hr over 30 Minutes Intravenous Every 8 hours 03/30/17 0724     03/29/17 1800  vancomycin (VANCOCIN) 1,500 mg in sodium chloride 0.9 % 500 mL IVPB  Status:  Discontinued     1,500 mg 250 mL/hr over 120 Minutes Intravenous Every 24 hours 03/28/17 1644 03/30/17 0724   03/28/17 1700  aztreonam (AZACTAM) 2 g in dextrose 5 % 50 mL IVPB  Status:  Discontinued     2 g 100 mL/hr over 30  Minutes Intravenous Every 8 hours 03/28/17 1633 03/30/17 0724   03/28/17 1700  levofloxacin (LEVAQUIN) IVPB 750 mg  Status:  Discontinued     750 mg 100 mL/hr over 90 Minutes Intravenous Every 48 hours 03/28/17 1633 03/30/17 0724   03/28/17 1645  vancomycin (VANCOCIN) 2,500 mg in sodium chloride 0.9 % 500 mL IVPB     2,500 mg 250 mL/hr over 120 Minutes Intravenous  Once 03/28/17 1633 03/28/17 2303       Objective: Vitals:   04/01/17 1700 04/01/17 2053 04/02/17 0020 04/02/17 0440  BP: 138/75 123/62 119/71 (!) 151/86  Pulse: 65 62 63 63  Resp: 16 16 16 16   Temp: 98.1 F (36.7 C) 98.5 F (36.9 C)  97.9 F (36.6 C)  TempSrc: Oral Oral  Oral  SpO2: 95% 95% 96% 98%  Weight: 123.2 kg (271 lb 9.6 oz)   122.7 kg (270 lb 6.4 oz)  Height: 5\' 10"  (1.778 m)       Intake/Output Summary  (Last 24 hours) at 04/02/17 0839 Last data filed at 04/02/17 0440  Gross per 24 hour  Intake          1442.13 ml  Output             1975 ml  Net          -532.87 ml   Filed Weights   04/01/17 0403 04/01/17 1700 04/02/17 0440  Weight: 127 kg (279 lb 15.8 oz) 123.2 kg (271 lb 9.6 oz) 122.7 kg (270 lb 6.4 oz)    Examination: General exam: Appears comfortable  HEENT: PERRLA, oral mucosa moist, no sclera icterus or thrush Respiratory system: Clear to auscultation. Respiratory effort normal. Cardiovascular system: S1 & S2 heard, RRR.  No murmurs  Gastrointestinal system: Abdomen soft, non-tender, nondistended. Normal bowel sound. No organomegaly Central nervous system: Alert and oriented. No focal neurological deficits. Extremities: No cyanosis, clubbing or edema Skin: No rashes or ulcers Psychiatry:  Mood & affect appropriate.     Data Reviewed: I have personally reviewed following labs and imaging studies  CBC:  Recent Labs Lab 03/28/17 1137 03/29/17 0304 03/30/17 0343 03/31/17 0329 04/01/17 0351 04/02/17 0414  WBC 15.3* 16.1* 12.0* 8.7 6.3 6.9  NEUTROABS 9.9*  --   --   --   --   --   HGB 16.2 13.0 12.3* 11.4* 10.7* 10.5*  HCT 47.3 38.7* 36.7* 34.2* 32.4* 32.1*  MCV 95.7 93.5 94.6 94.2 95.3 96.7  PLT 223 173 151 126* 111* 117*   Basic Metabolic Panel:  Recent Labs Lab 03/29/17 0304 03/30/17 0343 03/31/17 0329 04/01/17 0351 04/02/17 0414  NA 139 138 139 136 137  K 4.2 3.3* 3.4* 3.7 3.3*  CL 106 103 101 102 103  CO2 18* 22 25 24 24   GLUCOSE 143* 130* 108* 105* 98  BUN 35* 49* 47* 40* 36*  CREATININE 3.72* 3.87* 3.35* 2.80* 2.60*  CALCIUM 8.8* 8.5* 8.4* 8.4* 8.8*  MG 1.8 1.8 2.1 2.2 2.3  PHOS 2.2* 2.2*  --   --   --    GFR: Estimated Creatinine Clearance: 42.2 mL/min (A) (by C-G formula based on SCr of 2.6 mg/dL (H)). Liver Function Tests:  Recent Labs Lab 03/28/17 1315 03/31/17 0329  AST 33 37  ALT 23 21  ALKPHOS 105 63  BILITOT 1.2 1.1  PROT  7.8 6.7  ALBUMIN 4.3 3.4*   No results for input(s): LIPASE, AMYLASE in the last 168 hours. No results for input(s): AMMONIA in the  last 168 hours. Coagulation Profile: No results for input(s): INR, PROTIME in the last 168 hours. Cardiac Enzymes:  Recent Labs Lab 03/28/17 1137 03/28/17 1956 03/29/17 0304  TROPONINI 0.04* 0.38* 0.53*   BNP (last 3 results) No results for input(s): PROBNP in the last 8760 hours. HbA1C: No results for input(s): HGBA1C in the last 72 hours. CBG:  Recent Labs Lab 04/01/17 1706 04/01/17 2049 04/02/17 0019 04/02/17 0438 04/02/17 0748  GLUCAP 137* 104* 102* 113* 103*   Lipid Profile: No results for input(s): CHOL, HDL, LDLCALC, TRIG, CHOLHDL, LDLDIRECT in the last 72 hours. Thyroid Function Tests: No results for input(s): TSH, T4TOTAL, FREET4, T3FREE, THYROIDAB in the last 72 hours. Anemia Panel: No results for input(s): VITAMINB12, FOLATE, FERRITIN, TIBC, IRON, RETICCTPCT in the last 72 hours. Urine analysis:    Component Value Date/Time   COLORURINE YELLOW 03/28/2017 1404   APPEARANCEUR HAZY (A) 03/28/2017 1404   LABSPEC 1.011 03/28/2017 1404   PHURINE 5.0 03/28/2017 1404   GLUCOSEU NEGATIVE 03/28/2017 1404   HGBUR SMALL (A) 03/28/2017 1404   BILIRUBINUR NEGATIVE 03/28/2017 1404   KETONESUR NEGATIVE 03/28/2017 1404   PROTEINUR 100 (A) 03/28/2017 1404   NITRITE NEGATIVE 03/28/2017 1404   LEUKOCYTESUR NEGATIVE 03/28/2017 1404   Sepsis Labs: @LABRCNTIP (procalcitonin:4,lacticidven:4) ) Recent Results (from the past 240 hour(s))  Culture, blood (routine x 2)     Status: None (Preliminary result)   Collection Time: 03/28/17  4:47 PM  Result Value Ref Range Status   Specimen Description BLOOD RIGHT WRIST  Final   Special Requests   Final    BOTTLES DRAWN AEROBIC ONLY Blood Culture adequate volume   Culture NO GROWTH 4 DAYS  Final   Report Status PENDING  Incomplete  Culture, blood (routine x 2)     Status: None (Preliminary result)     Collection Time: 03/28/17  4:54 PM  Result Value Ref Range Status   Specimen Description BLOOD RIGHT HAND  Final   Special Requests   Final    BOTTLES DRAWN AEROBIC ONLY Blood Culture adequate volume   Culture NO GROWTH 4 DAYS  Final   Report Status PENDING  Incomplete  MRSA PCR Screening     Status: None   Collection Time: 03/28/17  5:45 PM  Result Value Ref Range Status   MRSA by PCR NEGATIVE NEGATIVE Final    Comment:        The GeneXpert MRSA Assay (FDA approved for NASAL specimens only), is one component of a comprehensive MRSA colonization surveillance program. It is not intended to diagnose MRSA infection nor to guide or monitor treatment for MRSA infections.   Culture, expectorated sputum-assessment     Status: None   Collection Time: 03/29/17  3:05 PM  Result Value Ref Range Status   Specimen Description SPUTUM  Final   Special Requests Normal  Final   Sputum evaluation   Final    Sputum specimen not acceptable for testing.  Please recollect.   Gram Stain Report Called to,Read Back By and Verified With: S TOTTLE RN AT 1645 ON [id number] BY SJW    Report Status 03/29/2017 FINAL  Final         Radiology Studies: No results found.    Scheduled Meds: . carvedilol  3.125 mg Oral BID WC  . DULoxetine  30 mg Oral Daily  . famotidine  20 mg Oral Daily  . gabapentin  300 mg Oral BID  . hydrALAZINE  25 mg Oral Q8H  . insulin aspart  0-9 Units Subcutaneous Q4H  . isosorbide mononitrate  30 mg Oral Daily  . QUEtiapine  25 mg Oral QHS  . rosuvastatin  40 mg Oral Daily   Continuous Infusions: . sodium chloride    . aztreonam Stopped (04/02/17 0200)  . heparin 1,500 Units/hr (04/01/17 1744)  . nitroGLYCERIN Stopped (03/29/17 1559)  . vancomycin Stopped (03/31/17 1851)     LOS: 5 days    Time spent in minutes: 35    Calvert Cantor, MD [facility] Pager: [email or web address] Password [email or web address] 04/02/2017, 8:39 AM

## 2017-04-02 NOTE — Progress Notes (Signed)
ANTICOAGULATION CONSULT NOTE - Follow Up Consult  Pharmacy Consult for Heparin + Aztreonam Indication: Afib/flutter + sepsis/HCAP  Allergies  Allergen Reactions  . Losartan Shortness Of Breath  . Elita Quick [Ceftazidime] Rash    Rash with blisters  . Lisinopril Rash    Patient Measurements: Height: 5\' 10"  (177.8 cm) Weight: 270 lb 6.4 oz (122.7 kg) IBW/kg (Calculated) : 73 Heparin Dosing Weight:  100.8 kg  Vital Signs: Temp: 97.9 F (36.6 C) (08/26 0440) Temp Source: Oral (08/26 0440) BP: 151/86 (08/26 0440) Pulse Rate: 63 (08/26 0440)  Labs:  Recent Labs  03/31/17 0329 04/01/17 0351 04/02/17 0414  HGB 11.4* 10.7* 10.5*  HCT 34.2* 32.4* 32.1*  PLT 126* 111* 117*  APTT 88* 69* 67*  HEPARINUNFRC 1.58* 0.75* 0.54  CREATININE 3.35* 2.80* 2.60*    Estimated Creatinine Clearance: 42.2 mL/min (A) (by C-G formula based on SCr of 2.6 mg/dL (H)).  Assessment:  Anticoag: On Eliquis PTA (LD 8/21) for afib and now transitioned to heparin. Baseline aPTT borderline low at 60, heparin level fasely elevated at > 2.2.  APTT 67, HL 0.54, Hgb 10.5, plts down 117  ID: Continues on abx for possible sepsis / HCAP. WBC down to 6.9, afeb. PCT peaked at 16.42, now down to 8.95 (8/23). LA 1.3. Plan 7d abx.  Antibiotics on this admission: 8/21 Vancomycin >>8/26 8/21 Aztreonam >> 8/26 Doxy>> 8/21 Levaquin >>8/23 (d/c'd d/t long QT)  Cultures: 8/21 BCx: Negative 8/21 S.pneumo antigen: negative 8/21 legionella: negative 8/21 MRSA PCR: negative 8/22 Resp Cx: Not acceptable for testing HIV negative   Goal of Therapy:  Heparin level 0.3-0.7 units/ml Monitor platelets by anticoagulation protocol: Yes  Eradication of infection    Plan:  Aztreonam 1g IV q8hr (day #6/7) D/c Vanco, changed to Doxy Continue IV heparin 1500 units/hr Daily HL and CBC Needs K+ supplementation TEE/DCC V on Monday   Morning Halberg S. Merilynn Finland, PharmD, [facility] Clinical Staff Pharmacist Pager  [phone]  Misty Stanley Stillinger 04/02/2017,10:50 AM

## 2017-04-02 NOTE — Progress Notes (Addendum)
Advanced Heart Failure Rounding Note   Subjective:    Admitted with acute hypoxic respiratory failure and flash pulmonary edema. CT with PNA. Now on antibiotics.   Waking halls. Feels good today. No CP or SOB.  Off lasix. Weight down a pound. 271. Creatinine down to 2.6   Does not want to proceed with TEE/DC-CV tomorrow   Objective:   Weight Range: 122.7 kg (270 lb 6.4 oz) Body mass index is 38.8 kg/m.   Vital Signs:   Temp:  [97.9 F (36.6 C)-98.5 F (36.9 C)] 98.3 F (36.8 C) (08/26 1147) Pulse Rate:  [58-65] 63 (08/26 1147) Resp:  [15-20] 20 (08/26 1147) BP: (108-151)/(58-86) 121/78 (08/26 1300) SpO2:  [95 %-98 %] 97 % (08/26 1147) Weight:  [122.7 kg (270 lb 6.4 oz)-123.2 kg (271 lb 9.6 oz)] 122.7 kg (270 lb 6.4 oz) (08/26 0440) Last BM Date: 03/31/17  Weight change: Filed Weights   04/01/17 0403 04/01/17 1700 04/02/17 0440  Weight: 127 kg (279 lb 15.8 oz) 123.2 kg (271 lb 9.6 oz) 122.7 kg (270 lb 6.4 oz)    Intake/Output:   Intake/Output Summary (Last 24 hours) at 04/02/17 1556 Last data filed at 04/02/17 1429  Gross per 24 hour  Intake             1105 ml  Output             1575 ml  Net             -470 ml      Physical Exam    General: Sitting in cgair. No resp difficulty HEENT: normal Neck: supple. no JVD. Carotids 2+ bilat; no bruits. No lymphadenopathy or thryomegaly appreciated. Cor: PMI nondisplaced. Mildly irregular No rubs, gallops or murmurs. Lungs: clear Abdomen: obese soft, nontender, nondistended. No hepatosplenomegaly. No bruits or masses. Good bowel sounds. Extremities: no cyanosis, clubbing, rash, edema Neuro: alert & orientedx3, cranial nerves grossly intact. moves all 4 extremities w/o difficulty. Affect pleasant   Telemetry   Aflutter 50-60 - personally reviewed.   EKG    Aflutter - personally reviewed.   Labs    CBC  Recent Labs  04/01/17 0351 04/02/17 0414  WBC 6.3 6.9  HGB 10.7* 10.5*  HCT 32.4* 32.1*  MCV  95.3 96.7  PLT 111* 117*   Basic Metabolic Panel  Recent Labs  04/01/17 0351 04/02/17 0414  NA 136 137  K 3.7 3.3*  CL 102 103  CO2 24 24  GLUCOSE 105* 98  BUN 40* 36*  CREATININE 2.80* 2.60*  CALCIUM 8.4* 8.8*  MG 2.2 2.3   Liver Function Tests  Recent Labs  03/31/17 0329  AST 37  ALT 21  ALKPHOS 63  BILITOT 1.1  PROT 6.7  ALBUMIN 3.4*   No results for input(s): LIPASE, AMYLASE in the last 72 hours. Cardiac Enzymes No results for input(s): CKTOTAL, CKMB, CKMBINDEX, TROPONINI in the last 72 hours.  BNP: BNP (last 3 results)  Recent Labs  12/06/16 1429 02/06/17 1136 03/28/17 1137  BNP 309.3* 330.6* 1,836.9*    ProBNP (last 3 results) No results for input(s): PROBNP in the last 8760 hours.   D-Dimer No results for input(s): DDIMER in the last 72 hours. Hemoglobin A1C No results for input(s): HGBA1C in the last 72 hours. Fasting Lipid Panel No results for input(s): CHOL, HDL, LDLCALC, TRIG, CHOLHDL, LDLDIRECT in the last 72 hours. Thyroid Function Tests No results for input(s): TSH, T4TOTAL, T3FREE, THYROIDAB in the last 72 hours.  Invalid input(s): FREET3  Other results:   Imaging   CT CHEST WITHOUT CONTRAST FINDINGS: Cardiovascular: Atherosclerosis of thoracic aorta is noted without aneurysm formation. Status post coronary artery bypass graft. No pericardial effusion is noted.  Mediastinum/Nodes: No enlarged mediastinal or axillary lymph nodes. Thyroid gland, trachea, and esophagus demonstrate no significant findings.  Lungs/Pleura: No pneumothorax or significant pleural effusion is noted. Bilateral posterior basilar opacities are noted, right greater than left, concerning for pneumonia or possibly edema. Multifocal airspace opacities are noted in right upper lobe concerning for pneumonia or possibly edema.  Upper Abdomen: No acute abnormality.  Musculoskeletal: No chest wall mass or suspicious bone  lesions identified.  IMPRESSION: Bilateral posterior basilar opacities are noted, right greater than left, concerning for pneumonia or possibly edema. Multifocal airspace opacities are noted in the right upper lobe is well concerning for pneumonia or possibly edema.  Medications:     Scheduled Medications: . carvedilol  3.125 mg Oral BID WC  . doxycycline  100 mg Oral Q12H  . DULoxetine  30 mg Oral Daily  . famotidine  20 mg Oral Daily  . gabapentin  300 mg Oral BID  . hydrALAZINE  25 mg Oral Q8H  . isosorbide mononitrate  30 mg Oral Daily  . QUEtiapine  25 mg Oral QHS  . rosuvastatin  40 mg Oral Daily    Infusions: . sodium chloride    . aztreonam Stopped (04/02/17 1022)  . heparin 1,500 Units/hr (04/02/17 1116)  . nitroGLYCERIN Stopped (03/29/17 1559)    PRN Medications: sodium chloride, albuterol, hydrALAZINE, ipratropium-albuterol, promethazine    Patient Profile   Christopher [name] is 56 year old with a history of CAD, CABG x4 and R CEA 2017, ICM, chronic systolic heart failure, and A flutter admitted with acute respiratory failure and a/c systolic heart failure.   Assessment/Plan   1. Acute hypoxemic respiratory failure: Suspect flash pulmonary edema in setting of hypertensive emergency.  He has been off Entresto and spironolactone and on lower dose of Coreg since recent admission with AKI.  - Improved, now on room air.   2. CAD: s/p CABG. Presented with dyspnea rather than chest pain.  TnI 0.04 is likely demand ischemia with volume overload. No CP.  -Troponin 0.04> 0.38>0.53.   - Continue Crestor.  - has not been on ASA given apixaban use.  - No change.   3. Atrial flutter: Persistent atypical flutter.   - Rate controlled. Refuses TEE/DC-CV tomorrow. Wants to do it as outpatient.  - We ahd long talk about this given that he is asx and BP and HR look good there is no rush for DC-CV - Continue Amio 200 mg daily.  - Resume Eliquis 5 bid. Stop heparin - DC-CV  vs AFL ablation as outpatient   4. Acute on chronic systolic CHF: Ischemic cardiomyopathy. Echo in 3/17 with EF 30-35%, improved to 40-45% on echo in 7/18 with mildly dilated RV.  Entresto, spironolactone and lasix on hold in setting of AKI.  On admit had flash pulmonary edema and hypertensive emergency. CXR much improved. -Volume status ok off lasix. Continue to hold.  - BP soft, continue Coreg 3.125 mg BID - Continue hydralazine 25 mg q 8 hours.  - Continue isosorbide 30 mg daily.   5. AKI on CKD stage 3 (baseline 1.8-2.0): Recent rise in creatinine to 5, meds held.  Creatinine up to 2.8>3.7>3.8>3.35> 3.32>>2.6 - Keep off spiro and entresto for now   6. Carotid stenosis: Right CEA in 2017. Followed by  [facility].  - No change.   7. PAD: Extensive on last peripheral dopplers, claudication has worsened in the last few months and is now limiting. Needs followup with Dr. Arbie Cookey as outpatient.  - no change.   8. ID: Suspect PNA.  PCT trending down.  - Now on Aztreonam and doxy  9. Nausea/Vomiting - Improved.   10. Hypokalemia  - Will supp   Total time spent 35 minutes. Over half that time spent discussing above.    Length of Stay: 5  Arvilla Meres, MD  04/02/2017, 3:56 PM  Advanced Heart Failure Team Pager [phone] (M-F; 7a - 4p)  Please contact [facility] for night-coverage after hours (4p -7a ) and weekends on [email or web address]

## 2017-04-02 NOTE — Progress Notes (Signed)
Paged the on call MD about 2.4 secs pulse. Pt is asymptomatic but waiting for call back

## 2017-04-03 ENCOUNTER — Inpatient Hospital Stay (HOSPITAL_COMMUNITY): Admission: RE | Admit: 2017-04-03 | Payer: Medicaid Other | Source: Ambulatory Visit

## 2017-04-03 DIAGNOSIS — I4892 Unspecified atrial flutter: Secondary | ICD-10-CM

## 2017-04-03 LAB — BASIC METABOLIC PANEL
ANION GAP: 9 (ref 5–15)
BUN: 33 mg/dL — ABNORMAL HIGH (ref 6–20)
CHLORIDE: 103 mmol/L (ref 101–111)
CO2: 24 mmol/L (ref 22–32)
Calcium: 9 mg/dL (ref 8.9–10.3)
Creatinine, Ser: 2.52 mg/dL — ABNORMAL HIGH (ref 0.61–1.24)
GFR calc Af Amer: 31 mL/min — ABNORMAL LOW (ref >60)
GFR, EST NON AFRICAN AMERICAN: 27 mL/min — AB (ref >60)
Glucose, Bld: 101 mg/dL — ABNORMAL HIGH (ref 65–99)
POTASSIUM: 3.4 mmol/L — AB (ref 3.5–5.1)
SODIUM: 136 mmol/L (ref 135–145)

## 2017-04-03 LAB — MAGNESIUM: MAGNESIUM: 2.1 mg/dL (ref 1.7–2.4)

## 2017-04-03 MED ORDER — TORSEMIDE 20 MG PO TABS
40.0000 mg | ORAL_TABLET | Freq: Every day | ORAL | Status: DC
  Administered 2017-04-03: 40 mg via ORAL
  Filled 2017-04-03: qty 2

## 2017-04-03 MED ORDER — ISOSORBIDE MONONITRATE ER 30 MG PO TB24: 30.0000 mg | ORAL_TABLET | Freq: Every day | ORAL | 0 refills | Status: DC

## 2017-04-03 MED ORDER — HYDRALAZINE HCL 25 MG PO TABS: 25.0000 mg | ORAL_TABLET | Freq: Three times a day (TID) | ORAL | 0 refills | Status: DC

## 2017-04-03 MED ORDER — POTASSIUM CHLORIDE CRYS ER 20 MEQ PO TBCR
40.0000 meq | EXTENDED_RELEASE_TABLET | Freq: Every day | ORAL | Status: DC
  Administered 2017-04-03: 40 meq via ORAL
  Filled 2017-04-03: qty 2

## 2017-04-03 MED ORDER — TORSEMIDE 20 MG PO TABS: 40.0000 mg | ORAL_TABLET | Freq: Every day | ORAL | Status: DC

## 2017-04-03 MED ORDER — TORSEMIDE 20 MG PO TABS: 40.0000 mg | ORAL_TABLET | Freq: Every day | ORAL | 3 refills | Status: DC

## 2017-04-03 MED ORDER — POTASSIUM CHLORIDE CRYS ER 20 MEQ PO TBCR: 40.0000 meq | EXTENDED_RELEASE_TABLET | Freq: Every day | ORAL | 0 refills | Status: DC

## 2017-04-03 NOTE — Progress Notes (Signed)
Advanced Heart Failure Rounding Note   Subjective:    Admitted with acute hypoxic respiratory failure and flash pulmonary edema. CT with PNA. Now on antibiotics.    Does not want to proceed with TEE/DC-CV.   Overall feeling good. Denies SOB/orthopnea Wants to go home.   Objective:   Weight Range: 270 lb (122.5 kg) Body mass index is 38.74 kg/m.   Vital Signs:   Temp:  [98 F (36.7 C)-98.3 F (36.8 C)] 98 F (36.7 C) (08/27 0548) Pulse Rate:  [60-63] 60 (08/27 0548) Resp:  [18-20] 18 (08/27 0548) BP: (108-124)/(58-80) 112/62 (08/27 0548) SpO2:  [97 %-99 %] 98 % (08/27 0548) Weight:  [270 lb (122.5 kg)] 270 lb (122.5 kg) (08/27 0548) Last BM Date: 03/31/17  Weight change: Filed Weights   04/01/17 1700 04/02/17 0440 04/03/17 0548  Weight: 271 lb 9.6 oz (123.2 kg) 270 lb 6.4 oz (122.7 kg) 270 lb (122.5 kg)    Intake/Output:   Intake/Output Summary (Last 24 hours) at 04/03/17 0745 Last data filed at 04/03/17 0600  Gross per 24 hour  Intake             1200 ml  Output             1100 ml  Net              100 ml      Physical Exam    General:  Well appearing. No resp difficulty. Sitting on the side of the bed.  HEENT: normal Neck: supple. JVP 10-11. Carotids 2+ bilat; no bruits. No lymphadenopathy or thryomegaly appreciated. Cor: PMI nondisplaced. Regular rate & rhythm. No rubs, gallops or murmurs. Lungs: clear Abdomen: soft, nontender, distended. No hepatosplenomegaly. No bruits or masses. Good bowel sounds. Extremities: no cyanosis, clubbing, rash, edema Neuro: alert & orientedx3, cranial nerves grossly intact. moves all 4 extremities w/o difficulty. Affect pleasant   Telemetry    A flutter 50-60s personally reviewed   EKG    Aflutter - personally reviewed.   Labs    CBC  Recent Labs  04/01/17 0351 04/02/17 0414  WBC 6.3 6.9  HGB 10.7* 10.5*  HCT 32.4* 32.1*  MCV 95.3 96.7  PLT 111* 117*   Basic Metabolic Panel  Recent Labs  04/01/17 0351 04/02/17 0414  NA 136 137  K 3.7 3.3*  CL 102 103  CO2 24 24  GLUCOSE 105* 98  BUN 40* 36*  CREATININE 2.80* 2.60*  CALCIUM 8.4* 8.8*  MG 2.2 2.3   Liver Function Tests No results for input(s): AST, ALT, ALKPHOS, BILITOT, PROT, ALBUMIN in the last 72 hours. No results for input(s): LIPASE, AMYLASE in the last 72 hours. Cardiac Enzymes No results for input(s): CKTOTAL, CKMB, CKMBINDEX, TROPONINI in the last 72 hours.  BNP: BNP (last 3 results)  Recent Labs  12/06/16 1429 02/06/17 1136 03/28/17 1137  BNP 309.3* 330.6* 1,836.9*    ProBNP (last 3 results) No results for input(s): PROBNP in the last 8760 hours.   D-Dimer No results for input(s): DDIMER in the last 72 hours. Hemoglobin A1C No results for input(s): HGBA1C in the last 72 hours. Fasting Lipid Panel No results for input(s): CHOL, HDL, LDLCALC, TRIG, CHOLHDL, LDLDIRECT in the last 72 hours. Thyroid Function Tests No results for input(s): TSH, T4TOTAL, T3FREE, THYROIDAB in the last 72 hours.  Invalid input(s): FREET3  Other results:   Imaging   CT CHEST WITHOUT CONTRAST FINDINGS: Cardiovascular: Atherosclerosis of thoracic aorta is noted without aneurysm  formation. Status post coronary artery bypass graft. No pericardial effusion is noted.  Mediastinum/Nodes: No enlarged mediastinal or axillary lymph nodes. Thyroid gland, trachea, and esophagus demonstrate no significant findings.  Lungs/Pleura: No pneumothorax or significant pleural effusion is noted. Bilateral posterior basilar opacities are noted, right greater than left, concerning for pneumonia or possibly edema. Multifocal airspace opacities are noted in right upper lobe concerning for pneumonia or possibly edema.  Upper Abdomen: No acute abnormality.  Musculoskeletal: No chest wall mass or suspicious bone lesions identified.  IMPRESSION: Bilateral posterior basilar opacities are noted, right greater than left,  concerning for pneumonia or possibly edema. Multifocal airspace opacities are noted in the right upper lobe is well concerning for pneumonia or possibly edema.  Medications:     Scheduled Medications: . apixaban  5 mg Oral BID  . carvedilol  3.125 mg Oral BID WC  . doxycycline  100 mg Oral Q12H  . DULoxetine  30 mg Oral Daily  . famotidine  20 mg Oral Daily  . gabapentin  300 mg Oral BID  . hydrALAZINE  25 mg Oral Q8H  . isosorbide mononitrate  30 mg Oral Daily  . QUEtiapine  25 mg Oral QHS  . rosuvastatin  40 mg Oral Daily    Infusions: . sodium chloride    . aztreonam Stopped (04/03/17 2725)  . nitroGLYCERIN Stopped (03/29/17 1559)    PRN Medications: sodium chloride, albuterol, hydrALAZINE, ipratropium-albuterol, promethazine    Patient Profile   [name] is 56 year old with a history of CAD, CABG x4 and R CEA 2017, ICM, chronic systolic heart failure, and A flutter admitted with acute respiratory failure and a/c systolic heart failure.   Assessment/Plan   1. Acute hypoxemic respiratory failure: Suspect flash pulmonary edema in setting of hypertensive emergency.  He has been off Entresto and spironolactone and on lower dose of Coreg since recent admission with AKI.  Resolved. On room air.   2. CAD: s/p CABG. Presented with dyspnea rather than chest pain.  TnI 0.04 is likely demand ischemia with volume overload. No CP.  -Troponin 0.04> 0.38>0.53.   - Continue Crestor.  - has not been on ASA given apixaban use.  - No chest pain.    3. Atrial flutter: Persistent atypical flutter.   - Rate controlled. Refuses TEE/DC-CV tomorrow. Wants to do it as outpatient.  - We had long talk about this given that he is asx and BP and HR look good there is no rush for DC-CV - Continue Amio 200 mg daily.  - Continue eliquis 5 bid. - He would like to hold off on DC-CV and pursue in about 4 week.    4. Acute on chronic systolic CHF: Ischemic cardiomyopathy. Echo in 3/17 with  EF 30-35%, improved to 40-45% on echo in 7/18 with mildly dilated RV.  Entresto, spironolactone and lasix on hold in setting of AKI.  On admit had flash pulmonary edema and hypertensive emergency. CXR much improved. - Mild volume overload. I would restart torsemide 40 mg  daily.   - Heart in the 50s. Continue coreg 3.125 mg twice a day.  - Continue hydralazine 25 mg q 8 hours.  - Continue isosorbide 30 mg daily.   5. AKI on CKD stage 3 (baseline 1.8-2.0): Recent rise in creatinine to 5, meds held.  Creatinine down to 2.5.  - Keep off spiro and entresto for now   6. Carotid stenosis: Right CEA in 2017. Followed by [facility].  - No change.  7. PAD: Extensive on last peripheral dopplers, claudication has worsened in the last few months and is now limiting. Needs followup with Dr. Arbie Cookey as outpatient.  - no change.   8. ID: Suspect PNA.  PCT trending down. WBC down to 6.9  - Completed aztreonam. Remains on doxycyline, will complete course at home.   9. Nausea/Vomiting Resolved.   10. Hypokalemia  - K 3.3 Supplement potassium. 40 meq today   Home meds for d/c  torsemide 40 mg daily  carvedilol 3.125 mg twice a day apixaban 5 mg twice a day hydralazine 25 mg every 8 hours  imdur 30 mg daily Kdur 40 meq daily   HF follow up 9.4 at 2:30    Length of Stay: 6  Amy Clegg, NP  04/03/2017, 7:45 AM  Advanced Heart Failure Team Pager [phone] (M-F; 7a - 4p)  Please contact [facility] for night-coverage after hours (4p -7a ) and weekends on [email or web address]  Patient seen with NP, agree with the above note.   He is stable for discharge from cardiac perspective.  Will have him continue apixaban for a total of 4 weeks (missed a couple of doses around the time of admission) and will then bring him in for outpatient DCCV.  Atrial flutter rate is stable.  He will continue amiodarone.  Still waiting to hear from EP regarding long-term plan for ablation, will followup with Graciela Husbands as outpatient.    Mild volume overload now, will restart torsemide at 40 mg daily. Creatinine has trended down.   BP controlled.  Suspect PNA followed by hypertensive emergency with flash pulmonary edema triggered admission (had been off BP-active meds with recent AKI).  He will need to finish course of doxycycline.   We have scheduled followup.   [name] 04/03/2017 8:34 AM

## 2017-04-03 NOTE — Progress Notes (Signed)
Discharge patient to home wife will transport. D/c instructions discussed with pts wife verbalized understanding. PIV removed no signs of swelling noted.

## 2017-04-03 NOTE — Discharge Summary (Addendum)
Physician Discharge Summary  Christopher [name] ZOX:[id number] DOB: 1961-07-16 DOA: 03/28/2017  PCP: Shirlean Mylar, MD  Admit date: 03/28/2017 Discharge date: 04/03/2017  Admitted From: home  Disposition:  home   Recommendations for Outpatient Follow-up:  1. Bmet and CBC on follow up  Discharge Condition:  stable   CODE STATUS:  Full code   Consultations:  cardiology    Discharge Diagnoses:  Principal Problem:   Acute respiratory failure with hypoxia (HCC) Active Problems:   Acute on chronic combined systolic and diastolic congestive heart failure (HCC)   S/P CABG x 4   Acute kidney injury - CKD (chronic kidney disease) stage 3, GFR 30-59 ml/min   Hypertensive emergency   Atrial flutter with rapid ventricular response (HCC)      Subjective: No complaints today. Wants to go home.  Brief Summary: Christopher [name] is a 56 y/o male with CAD s/o CABG x 4,  Chronic d and sCHF (ICM), CKD 3,  R CEA, A-flutter on Eliquis who presents to the ER for dyspnea and is admitted for to the ICU for HCAP, HTN emergency, AKI and acute sCHF.   Started on BiPAP, Nitro infusion, given IV Lasix and Vanc, Azactam and Levaquin. CXR>> b/l diffuse infiltrates    He was admitted from 8/16-8/17 with AKI. He was recommended to hold Entresto and Aldactone and was cut back on Coreg and placed on a low dose of Torsemide. Asked to f/u at CHF clinic but ended up in the hospital instead.    Principal Problem:   Acute respiratory failure with hypoxia- required BiPAP  (A) Acute on chronic combined systolic and diastolic congestive heart failure - cardiology suspects flash pulm edema due to HTN emergency- diuretics per cardiology - weight 124 kg> 122 - I and O inaccurate  - CXR 8/26 shows resolution - plan per cardiology:  torsemide 40 mg daily  carvedilol 3.125 mg twice a day apixaban 5 mg twice a day hydralazine 25 mg every 8 hours  imdur 30 mg daily Kdur 40 meq daily  HF follow up 9.4 at 2:30    (B) HCAP, Sepsis - WBC 16,  Procalcitonin 16 on 8/22 - LA normal - Vanc/ Azactam- started on 8/21-  - MRSA PCR, strep pneumo and legionella negative - he has finished 7 days of antibiotics today- symptoms resolved -  CXR 8/26- clear    Atrial flutter with rapid ventricular response  - on Eliquis at home- now heparin infusion - plan for DCCV but patient wants to hold off on it for now and will f/u with cardiology for rescheduling it.     Hypertensive emergency - resolved - Coreg, Hydralazine, Imdur     CAD S/P CABG x 4 - statin and anticoagulation    Acute kidney injury on CKD 3 - Cr 3.38 on admission - 2.52 now - baseline in 1.6-2.2  Acute thrombocytopenia - platelets 173 on 8/22 - now 117 - follow as outpt   Hospital Course:    Discharge Instructions  Discharge Instructions    (HEART FAILURE PATIENTS) Call MD:  Anytime you have any of the following symptoms: 1) 3 pound weight gain in 24 hours or 5 pounds in 1 week 2) shortness of breath, with or without a dry hacking cough 3) swelling in the hands, feet or stomach 4) if you have to sleep on extra pillows at night in order to breathe.    Complete by:  As directed    Diet - low sodium heart healthy  Complete by:  As directed    Increase activity slowly    Complete by:  As directed      Allergies as of 04/03/2017      Reactions   Losartan Shortness Of Breath   Fortaz [ceftazidime] Rash   Rash with blisters   Lisinopril Rash      Medication List    TAKE these medications   apixaban 5 MG Tabs tablet Commonly known as:  ELIQUIS Take 1 tablet (5 mg total) by mouth 2 (two) times daily.   carvedilol 3.125 MG tablet Commonly known as:  COREG Take 1 tablet (3.125 mg total) by mouth 2 (two) times daily. Start if BP above 120's/ 80's   DULoxetine 30 MG capsule Commonly known as:  CYMBALTA Take 30 mg by mouth daily.   gabapentin 300 MG capsule Commonly known as:  NEURONTIN Take 1 capsule (300 mg  total) by mouth 2 (two) times daily.   hydrALAZINE 25 MG tablet Commonly known as:  APRESOLINE Take 1 tablet (25 mg total) by mouth every 8 (eight) hours.   isosorbide mononitrate 30 MG 24 hr tablet Commonly known as:  IMDUR Take 1 tablet (30 mg total) by mouth daily.   potassium chloride SA 20 MEQ tablet Commonly known as:  K-DUR,KLOR-CON Take 2 tablets (40 mEq total) by mouth daily.   QUEtiapine 25 MG tablet Commonly known as:  SEROQUEL Take 12.5-25 mg by mouth at bedtime.   ranitidine 150 MG tablet Commonly known as:  ZANTAC Take 150 mg by mouth 2 (two) times daily.   rosuvastatin 40 MG tablet Commonly known as:  CRESTOR TAKE 1 TABLET BY MOUTH ONCE DAILY   senna-docusate 8.6-50 MG tablet Commonly known as:  Senokot-S Take 1 tablet by mouth at bedtime. For constipation   torsemide 20 MG tablet Commonly known as:  DEMADEX Take 2 tablets (40 mg total) by mouth daily. Start on 03/26/17 What changed:  how much to take            Discharge Care Instructions        Start     Ordered   04/04/17 0000  isosorbide mononitrate (IMDUR) 30 MG 24 hr tablet  Daily     04/03/17 1045   04/04/17 0000  potassium chloride SA (K-DUR,KLOR-CON) 20 MEQ tablet  Daily     04/03/17 1045   04/03/17 0000  hydrALAZINE (APRESOLINE) 25 MG tablet  Every 8 hours     04/03/17 1045   04/03/17 0000  torsemide (DEMADEX) 20 MG tablet  Daily     04/03/17 1045   04/03/17 0000  Increase activity slowly     04/03/17 1045   04/03/17 0000  Diet - low sodium heart healthy     04/03/17 1045   04/03/17 0000  (HEART FAILURE PATIENTS) Call MD:  Anytime you have any of the following symptoms: 1) 3 pound weight gain in 24 hours or 5 pounds in 1 week 2) shortness of breath, with or without a dry hacking cough 3) swelling in the hands, feet or stomach 4) if you have to sleep on extra pillows at night in order to breathe.     04/03/17 1045     Follow-up Information    Laurey Morale, MD Follow up on  04/11/2017.   Specialty:  Cardiology Why:  at 92:30 am for hospital follow up. Garage code [id number]. Contact information: [address] [place] [address] [phone]  Allergies  Allergen Reactions  . Losartan Shortness Of Breath  . Elita Quick [Ceftazidime] Rash    Rash with blisters  . Lisinopril Rash     Procedures/Studies:   Dg Chest 2 View  Result Date: 03/17/2017 CLINICAL DATA:  Cough. EXAM: CHEST  2 VIEW COMPARISON:  Radiographs of Jan 06, 2016. FINDINGS: Stable cardiomediastinal silhouette. Status post coronary artery bypass graft. No pneumothorax is noted. Right lung is clear. Stable left basilar scarring is noted. No acute pulmonary disease is noted. Stable bilateral rib fractures. IMPRESSION: Stable left basilar scarring. No acute cardiopulmonary abnormality seen. Electronically Signed   By: Lupita Raider, M.D.   On: 03/17/2017 12:38   Ct Chest Wo Contrast  Result Date: 03/28/2017 CLINICAL DATA:  Shortness of breath. EXAM: CT CHEST WITHOUT CONTRAST TECHNIQUE: Multidetector CT imaging of the chest was performed following the standard protocol without IV contrast. COMPARISON:  Radiograph of same day. FINDINGS: Cardiovascular: Atherosclerosis of thoracic aorta is noted without aneurysm formation. Status post coronary artery bypass graft. No pericardial effusion is noted. Mediastinum/Nodes: No enlarged mediastinal or axillary lymph nodes. Thyroid gland, trachea, and esophagus demonstrate no significant findings. Lungs/Pleura: No pneumothorax or significant pleural effusion is noted. Bilateral posterior basilar opacities are noted, right greater than left, concerning for pneumonia or possibly edema. Multifocal airspace opacities are noted in right upper lobe concerning for pneumonia or possibly edema. Upper Abdomen: No acute abnormality. Musculoskeletal: No chest wall mass or suspicious bone lesions identified. IMPRESSION: Bilateral posterior basilar opacities  are noted, right greater than left, concerning for pneumonia or possibly edema. Multifocal airspace opacities are noted in the right upper lobe is well concerning for pneumonia or possibly edema. Aortic Atherosclerosis (ICD10-I70.0). Electronically Signed   By: Lupita Raider, M.D.   On: 03/28/2017 17:45   US Renal  Result Date: 03/23/2017 CLINICAL DATA:  Acute kidney injury chronic kidney disease stage 3 EXAM: RENAL / URINARY TRACT ULTRASOUND COMPLETE COMPARISON:  None. FINDINGS: Right Kidney: Length: 9 cm. Echogenicity within normal limits. No mass or hydronephrosis visualized. Left Kidney: Length: 9.6 cm. Echogenicity within normal limits. No mass or hydronephrosis visualized. Bladder: Appears normal for degree of bladder distention. IMPRESSION: Negative renal ultrasound Electronically Signed   By: Jasmine Pang M.D.   On: 03/23/2017 23:17   Dg Chest Port 1 View  Result Date: 04/02/2017 CLINICAL DATA:  Congestive heart failure. Hypoxia. Acute kidney injury. EXAM: PORTABLE CHEST 1 VIEW COMPARISON:  03/29/2017 FINDINGS: The heart size and mediastinal contours are within normal limits. Prior CABG. Continued resolution of pulmonary interstitial infiltrates since prior study, consistent with resolving pulmonary edema. No evidence of acute infiltrate or pleural effusion. IMPRESSION: Continued resolution of pulmonary edema since prior study. No acute findings. Electronically Signed   By: Myles Rosenthal M.D.   On: 04/02/2017 09:33   Dg Chest Port 1 View  Result Date: 03/29/2017 CLINICAL DATA:  Respiratory failure . EXAM: PORTABLE CHEST 1 VIEW COMPARISON:  CT 03/28/2017.  Chest x-ray 03/28/2017, 03/17/2017. FINDINGS: Prior CABG. Cardiomegaly. Persistent but improved bilateral pulmonary infiltrates/ edema. No prominent pleural effusion. No pneumothorax. IMPRESSION: Prior CABG. Cardiomegaly. Persistent but improved bilateral pulmonary infiltrates/edema. Findings suggest improving congestive heart failure.  Electronically Signed   By: Maisie Fus  Register   On: 03/29/2017 06:57   Dg Chest Port 1 View  Result Date: 03/28/2017 CLINICAL DATA:  Severe shortness of breath began today. History of CHF, COPD, former smoker. EXAM: PORTABLE CHEST 1 VIEW COMPARISON:  Chest x-ray of March 17, 2017 FINDINGS: The lungs  are adequately inflated. The interstitial markings are increased. The pulmonary vascularity is engorged. The cardiac silhouette is enlarged. The patient has undergone previous CABG. No pleural effusion is observed. IMPRESSION: CHF with pulmonary interstitial edema. The appearance of the chest has deteriorated since the study of 11 days ago. Electronically Signed   By: David  [place] M.D.   On: 03/28/2017 11:54       Discharge Exam: Vitals:   04/02/17 2008 04/03/17 0548  BP: 124/80 112/62  Pulse: 62 60  Resp: 18 18  Temp: 98.2 F (36.8 C) 98 F (36.7 C)  SpO2: 99% 98%   Vitals:   04/02/17 1147 04/02/17 1300 04/02/17 2008 04/03/17 0548  BP: (!) 108/58 121/78 124/80 112/62  Pulse: 63  62 60  Resp: 20  18 18   Temp: 98.3 F (36.8 C)  98.2 F (36.8 C) 98 F (36.7 C)  TempSrc: Oral  Oral Oral  SpO2: 97%  99% 98%  Weight:    122.5 kg (270 lb)  Height:        General: Pt is alert, awake, not in acute distress Cardiovascular: RRR, S1/S2 +, no rubs, no gallops Respiratory: CTA bilaterally, no wheezing, no rhonchi Abdominal: Soft, NT, ND, bowel sounds + Extremities: no edema, no cyanosis    The results of significant diagnostics from this hospitalization (including imaging, microbiology, ancillary and laboratory) are listed below for reference.     Microbiology: Recent Results (from the past 240 hour(s))  Culture, blood (routine x 2)     Status: None   Collection Time: 03/28/17  4:47 PM  Result Value Ref Range Status   Specimen Description BLOOD RIGHT WRIST  Final   Special Requests   Final    BOTTLES DRAWN AEROBIC ONLY Blood Culture adequate volume   Culture NO GROWTH 5 DAYS   Final   Report Status 04/02/2017 FINAL  Final  Culture, blood (routine x 2)     Status: None   Collection Time: 03/28/17  4:54 PM  Result Value Ref Range Status   Specimen Description BLOOD RIGHT HAND  Final   Special Requests   Final    BOTTLES DRAWN AEROBIC ONLY Blood Culture adequate volume   Culture NO GROWTH 5 DAYS  Final   Report Status 04/02/2017 FINAL  Final  MRSA PCR Screening     Status: None   Collection Time: 03/28/17  5:45 PM  Result Value Ref Range Status   MRSA by PCR NEGATIVE NEGATIVE Final    Comment:        The GeneXpert MRSA Assay (FDA approved for NASAL specimens only), is one component of a comprehensive MRSA colonization surveillance program. It is not intended to diagnose MRSA infection nor to guide or monitor treatment for MRSA infections.   Culture, expectorated sputum-assessment     Status: None   Collection Time: 03/29/17  3:05 PM  Result Value Ref Range Status   Specimen Description SPUTUM  Final   Special Requests Normal  Final   Sputum evaluation   Final    Sputum specimen not acceptable for testing.  Please recollect.   Gram Stain Report Called to,Read Back By and Verified With: S TOTTLE RN AT 1645 ON [id number] BY SJW    Report Status 03/29/2017 FINAL  Final     Labs: BNP (last 3 results)  Recent Labs  12/06/16 1429 02/06/17 1136 03/28/17 1137  BNP 309.3* 330.6* 1,836.9*   Basic Metabolic Panel:  Recent Labs Lab 03/29/17 0304 03/30/17 0343 03/31/17 0329 04/01/17  4782 04/02/17 0414 04/03/17 0648  NA 139 138 139 136 137 136  K 4.2 3.3* 3.4* 3.7 3.3* 3.4*  CL 106 103 101 102 103 103  CO2 18* 22 25 24 24 24   GLUCOSE 143* 130* 108* 105* 98 101*  BUN 35* 49* 47* 40* 36* 33*  CREATININE 3.72* 3.87* 3.35* 2.80* 2.60* 2.52*  CALCIUM 8.8* 8.5* 8.4* 8.4* 8.8* 9.0  MG 1.8 1.8 2.1 2.2 2.3 2.1  PHOS 2.2* 2.2*  --   --   --   --    Liver Function Tests:  Recent Labs Lab 03/28/17 1315 03/31/17 0329  AST 33 37  ALT 23 21  ALKPHOS  105 63  BILITOT 1.2 1.1  PROT 7.8 6.7  ALBUMIN 4.3 3.4*   No results for input(s): LIPASE, AMYLASE in the last 168 hours. No results for input(s): AMMONIA in the last 168 hours. CBC:  Recent Labs Lab 03/28/17 1137 03/29/17 0304 03/30/17 0343 03/31/17 0329 04/01/17 0351 04/02/17 0414  WBC 15.3* 16.1* 12.0* 8.7 6.3 6.9  NEUTROABS 9.9*  --   --   --   --   --   HGB 16.2 13.0 12.3* 11.4* 10.7* 10.5*  HCT 47.3 38.7* 36.7* 34.2* 32.4* 32.1*  MCV 95.7 93.5 94.6 94.2 95.3 96.7  PLT 223 173 151 126* 111* 117*   Cardiac Enzymes:  Recent Labs Lab 03/28/17 1137 03/28/17 1956 03/29/17 0304  TROPONINI 0.04* 0.38* 0.53*   BNP: Invalid input(s): POCBNP CBG:  Recent Labs Lab 04/01/17 2049 04/02/17 0019 04/02/17 0438 04/02/17 0748 04/02/17 1112  GLUCAP 104* 102* 113* 103* 110*   D-Dimer No results for input(s): DDIMER in the last 72 hours. Hgb A1c No results for input(s): HGBA1C in the last 72 hours. Lipid Profile No results for input(s): CHOL, HDL, LDLCALC, TRIG, CHOLHDL, LDLDIRECT in the last 72 hours. Thyroid function studies No results for input(s): TSH, T4TOTAL, T3FREE, THYROIDAB in the last 72 hours.  Invalid input(s): FREET3 Anemia work up No results for input(s): VITAMINB12, FOLATE, FERRITIN, TIBC, IRON, RETICCTPCT in the last 72 hours. Urinalysis    Component Value Date/Time   COLORURINE YELLOW 03/28/2017 1404   APPEARANCEUR HAZY (A) 03/28/2017 1404   LABSPEC 1.011 03/28/2017 1404   PHURINE 5.0 03/28/2017 1404   GLUCOSEU NEGATIVE 03/28/2017 1404   HGBUR SMALL (A) 03/28/2017 1404   BILIRUBINUR NEGATIVE 03/28/2017 1404   KETONESUR NEGATIVE 03/28/2017 1404   PROTEINUR 100 (A) 03/28/2017 1404   NITRITE NEGATIVE 03/28/2017 1404   LEUKOCYTESUR NEGATIVE 03/28/2017 1404   Sepsis Labs Invalid input(s): PROCALCITONIN,  WBC,  LACTICIDVEN Microbiology Recent Results (from the past 240 hour(s))  Culture, blood (routine x 2)     Status: None   Collection Time:  03/28/17  4:47 PM  Result Value Ref Range Status   Specimen Description BLOOD RIGHT WRIST  Final   Special Requests   Final    BOTTLES DRAWN AEROBIC ONLY Blood Culture adequate volume   Culture NO GROWTH 5 DAYS  Final   Report Status 04/02/2017 FINAL  Final  Culture, blood (routine x 2)     Status: None   Collection Time: 03/28/17  4:54 PM  Result Value Ref Range Status   Specimen Description BLOOD RIGHT HAND  Final   Special Requests   Final    BOTTLES DRAWN AEROBIC ONLY Blood Culture adequate volume   Culture NO GROWTH 5 DAYS  Final   Report Status 04/02/2017 FINAL  Final  MRSA PCR Screening  Status: None   Collection Time: 03/28/17  5:45 PM  Result Value Ref Range Status   MRSA by PCR NEGATIVE NEGATIVE Final    Comment:        The GeneXpert MRSA Assay (FDA approved for NASAL specimens only), is one component of a comprehensive MRSA colonization surveillance program. It is not intended to diagnose MRSA infection nor to guide or monitor treatment for MRSA infections.   Culture, expectorated sputum-assessment     Status: None   Collection Time: 03/29/17  3:05 PM  Result Value Ref Range Status   Specimen Description SPUTUM  Final   Special Requests Normal  Final   Sputum evaluation   Final    Sputum specimen not acceptable for testing.  Please recollect.   Gram Stain Report Called to,Read Back By and Verified With: S TOTTLE RN AT 1645 ON [id number] BY SJW    Report Status 03/29/2017 FINAL  Final     Time coordinating discharge: Over 30 minutes  SIGNED:   Calvert Cantor, MD  [facility] 04/03/2017, 10:47 AM Pager   If 7PM-7AM, please contact night-coverage [email or web address] Password [email or web address]

## 2017-04-03 NOTE — Progress Notes (Signed)
Heart Failure Navigator Consult Note  Presentation: Per Dr. Cranston Neighbor is 56 year old with a history of CAD, CABG x4 and R CEA 2017, ICM, chronic systolic heart failure, and A flutter.   Admitted March 2017 with CP. Had anterior MI and underwent CABG x4 R CEA March 2017. Hospital course was complicated by cardiogenic shock and  A flutter.    He was seen in the CHF clinic in July 2018, noted to be in atrial flutter, rate was controlled. He was started on Eliquis, and DCCV was planned for a month later. He was also started on Entrestoand his torsemide was increased for volume overload. He was seen by Dr. Graciela Husbands for consideration of atrial flutter ablation who felt that ablation was not indicated.   Admitted last week with fatigue and cough thought to be secondary to AKI/gabapentin. Creatinine peaked at 5. Entresto, torsemide, and spiro stopped. On Sunday he was instructed to restart 20 mg torsemide.   Over the weekend he was in his usual state and able to perform activities. Today he presented the HF clinic and due to acute dyspnea he was sent to [facility] ED for evaluation. CXR was worse with edema noted. Pertinent admission labs include: BNP 1836, K 5.3, creatinine 2.86, and WBC 15.3 . Oxygen saturations in the ED in the low 80s so BiPap started. Received 80 mg IV lasix with poor response  Past Medical History:  Diagnosis Date  . Anxiety   . CHF (congestive heart failure) (HCC)    Hattie Perch 03/23/2017  . CKD (chronic kidney disease), stage III    Hattie Perch 03/23/2017  . Complete heart block (HCC) 10/20/2015  . COPD (chronic obstructive pulmonary disease) (HCC)    pt denies this hx on 03/23/2017  . Coronary artery disease   . High cholesterol   . Hypertension   . Myocardial infarction [facility]) 10/2015   "massive one; saw scar tissue of possible previous MI maybe in 2013" (03/23/2017)  . Stenosis of right carotid artery   . TIA (transient ischemic attack) 2016    Social History   Social  History  . Marital status: Single    Spouse name: N/A  . Number of children: N/A  . Years of education: N/A   Social History Main Topics  . Smoking status: Former Smoker    Packs/day: 1.00    Years: 23.00    Types: Cigarettes    Quit date: 2004  . Smokeless tobacco: Never Used  . Alcohol use No     Comment: Previously drank 24 beer/daily for 27 years, but has been sober since January 08, 2002  . Drug use: Yes    Types: Marijuana     Comment: 03/23/2017 "daily"  . Sexual activity: Yes   Other Topics Concern  . None   Social History Narrative   Lives with girlfriend in an apartment on the second floor.     Works as a Teaching laboratory technician for apartment complex.     Has no children.  Education: 10th grade.    ECHO:Study Conclusions-03/02/17  - Left ventricle: The cavity size was normal. There was moderate   focal basal hypertrophy. Systolic function was mildly to   moderately reduced. The estimated ejection fraction was in the   range of 40% to 45%. Diffuse hypokinesis. There is akinesis of   the apical myocardium. Features are consistent with a   pseudonormal left ventricular filling pattern, with concomitant   abnormal relaxation and increased filling pressure (grade 2  diastolic dysfunction). - Aorta: Aortic root dimension: 40 mm (ED). - Aortic root: The aortic root was mildly dilated. - Right ventricle: The cavity size was mildly dilated. Wall   thickness was normal.  ------------------------------------------------------------------- Study data:   Study status:  Routine.  Procedure:  The patient reported no pain pre or post test. Transthoracic echocardiography for left ventricular function evaluation, for right ventricular function evaluation, and for assessment of valvular function. Image quality was suboptimal. Intravenous contrast (Definity) was administered to enhance regional wall motion assessment and opacify the LV.  Study completion:  There were no  complications. Transthoracic echocardiography.  M-mode, complete 2D, spectral Doppler, and color Doppler.  Birthdate:  Patient birthdate: [birth year].  Age:  Patient is 56 yr old.  Sex:  Gender: male. BMI: 39.6 kg/m^2.  Blood pressure:     120/80  Patient status: Outpatient.  Study date:  Study date: 03/02/2017. Study time: 02:19 PM.  Location:  [facility] [facility]   BNP    Component Value Date/Time   BNP 1,836.9 (H) 03/28/2017 1137    ProBNP No results found for: PROBNP   Education Assessment and Provision:  Detailed education and instructions provided on heart failure disease management including the following:  Signs and symptoms of Heart Failure When to call the physician Importance of daily weights Low sodium diet Fluid restriction Medication management Anticipated future follow-up appointments  Patient education given on each of the above topics.  Patient acknowledges understanding and acceptance of all instructions.  I spoke with Mr. Zagal briefly regarding his HF and current hospitalization.  He tells me that he has received all HF education before.  He has a scale and says he weighs daily.  I reviewed when to contact the physician related to weight increases/ worsening symptoms.   He denies any issues with getting or taking prescribed medications.  He will follow with the AHF Clinic after discharge.  Education Materials:  "Living Better With Heart Failure" Booklet, Daily Weight Tracker Tool   High Risk Criteria for Readmission and/or Poor Patient Outcomes:  (Recommend Follow-up with Advanced Heart Failure Clinic)   EF <30%- 40-45  2 or more admissions in 6 months-2/33mo  Difficult social situation- no  Demonstrates medication noncompliance- denies   Barriers of Care:  Knowledge and compliance  Discharge Planning:   Plans to return to home with wife in [place].

## 2017-04-07 ENCOUNTER — Telehealth (HOSPITAL_COMMUNITY): Payer: Self-pay | Admitting: Surgery

## 2017-04-07 NOTE — Telephone Encounter (Signed)
HF Nurse navigator Post Discharge Telephone Call- I called Christopher [name] regarding his recent hospitalization.  He tell me that he is doing "very well".  He says that he is weighing each day and weight today ws 263.4 lbs versus discharge weight of 270 lbs on 04/02/17.  He denies any increased swelling or SOB.  He say that he has all medications and is taking as prescribed.  He also tells me that his legs are feeling better than they have in a long time (no cramping while exercising) and he attributes this to the SCD Hose in the hospital?   I reminded him of his follow-up appt in the AHF Clinic on Sept 4 at 2:30pm.  He says that he will be here.  I asked him to call back with any concerns or questions related to his HF.

## 2017-04-11 ENCOUNTER — Other Ambulatory Visit (HOSPITAL_COMMUNITY): Payer: Self-pay

## 2017-04-11 ENCOUNTER — Ambulatory Visit (HOSPITAL_COMMUNITY)
Admission: RE | Admit: 2017-04-11 | Discharge: 2017-04-11 | Disposition: A | Discharge status: Home / Self Care | Payer: Medicaid Other | Source: Ambulatory Visit | Attending: Cardiology | Admitting: Cardiology

## 2017-04-11 ENCOUNTER — Encounter (HOSPITAL_COMMUNITY): Payer: Self-pay

## 2017-04-11 VITALS — BP 138/94 | HR 59 | Wt 266.2 lb

## 2017-04-11 DIAGNOSIS — I2510 Atherosclerotic heart disease of native coronary artery without angina pectoris: Secondary | ICD-10-CM

## 2017-04-11 DIAGNOSIS — Z7902 Long term (current) use of antithrombotics/antiplatelets: Secondary | ICD-10-CM | POA: Diagnosis not present

## 2017-04-11 DIAGNOSIS — E669 Obesity, unspecified: Secondary | ICD-10-CM | POA: Diagnosis not present

## 2017-04-11 DIAGNOSIS — I472 Ventricular tachycardia: Secondary | ICD-10-CM | POA: Diagnosis not present

## 2017-04-11 DIAGNOSIS — I5022 Chronic systolic (congestive) heart failure: Secondary | ICD-10-CM | POA: Insufficient documentation

## 2017-04-11 DIAGNOSIS — I255 Ischemic cardiomyopathy: Secondary | ICD-10-CM | POA: Insufficient documentation

## 2017-04-11 DIAGNOSIS — I252 Old myocardial infarction: Secondary | ICD-10-CM | POA: Insufficient documentation

## 2017-04-11 DIAGNOSIS — I4892 Unspecified atrial flutter: Secondary | ICD-10-CM | POA: Insufficient documentation

## 2017-04-11 DIAGNOSIS — I6529 Occlusion and stenosis of unspecified carotid artery: Secondary | ICD-10-CM | POA: Diagnosis not present

## 2017-04-11 DIAGNOSIS — Z6838 Body mass index (BMI) 38.0-38.9, adult: Secondary | ICD-10-CM | POA: Diagnosis not present

## 2017-04-11 DIAGNOSIS — E785 Hyperlipidemia, unspecified: Secondary | ICD-10-CM | POA: Insufficient documentation

## 2017-04-11 DIAGNOSIS — I739 Peripheral vascular disease, unspecified: Secondary | ICD-10-CM

## 2017-04-11 DIAGNOSIS — Z951 Presence of aortocoronary bypass graft: Secondary | ICD-10-CM | POA: Diagnosis not present

## 2017-04-11 DIAGNOSIS — Z8249 Family history of ischemic heart disease and other diseases of the circulatory system: Secondary | ICD-10-CM | POA: Diagnosis not present

## 2017-04-11 DIAGNOSIS — N183 Chronic kidney disease, stage 3 unspecified: Secondary | ICD-10-CM

## 2017-04-11 DIAGNOSIS — G629 Polyneuropathy, unspecified: Secondary | ICD-10-CM | POA: Diagnosis not present

## 2017-04-11 DIAGNOSIS — Z9889 Other specified postprocedural states: Secondary | ICD-10-CM | POA: Insufficient documentation

## 2017-04-11 DIAGNOSIS — Z803 Family history of malignant neoplasm of breast: Secondary | ICD-10-CM | POA: Insufficient documentation

## 2017-04-11 LAB — BASIC METABOLIC PANEL
ANION GAP: 10 (ref 5–15)
BUN: 29 mg/dL — AB (ref 6–20)
CALCIUM: 9.3 mg/dL (ref 8.9–10.3)
CO2: 28 mmol/L (ref 22–32)
Chloride: 101 mmol/L (ref 101–111)
Creatinine, Ser: 2.17 mg/dL — ABNORMAL HIGH (ref 0.61–1.24)
GFR calc Af Amer: 38 mL/min — ABNORMAL LOW (ref >60)
GFR calc non Af Amer: 32 mL/min — ABNORMAL LOW (ref >60)
GLUCOSE: 99 mg/dL (ref 65–99)
Potassium: 3.8 mmol/L (ref 3.5–5.1)
Sodium: 139 mmol/L (ref 135–145)

## 2017-04-11 NOTE — Progress Notes (Signed)
Patient ID: Christopher [name], male   DOB: 20-Apr-1961, 56 y.o.   MRN: [id number]   PCP: Dr. Hyman Hopes Cardiology: Dr. Shirlee Latch Vascular: Dr Early  56 yo with history of CAD s/p CABG, ischemic cardiomyopathy, and CKD presents for cardiology followup.  In 3/17, he came to the hospital with a late presentation inferior MI.  He was noted to have occluded RCA with collaterals and 95% proximal LAD.  He had CABG x 4 + right CEA.  He had a balloon pump with cardiogenic shock.  He additionally had multiple episodes of post-op VT/VF.  Amiodarone was started.  Repeat cath showed slow flow in the SVG-D and SVG-ramus given diffusely diseased and small distal targets.  He developed AKI.  He had prolonged mechanical ventilation and a tracheostomy.  He had RV failure that was treated with Revatio.  He had transient atrial flutter that resolved quickly.  Last echo in 3/17 showed EF 30-35%.  Finally, the tracheostomy was removed.  He went to inpatient rehab for about a week and is now home again.  Echo 6/17 with EF 40-45%.   Admitted 8/21-8/27/18 with acute respiratory failure due to HCAP and HTN emergency. Also had rapid atrial flutter. Rate improved and DCCV was planned for outpatient as he had missed a few doses of Eliquis at home. Discharge weight was 270 pounds.   He returns today for HF follow up. Says that he feels great, better than he has felt in months. Denies SOB with activity, no SOB with walking in the neighborhood. He is taking all medications and watching his salt intake. Weight at home is stable, down 4 pounds from discharge weight.   ECG: A flutter 59 bpm.   Labs (4/17): K 3.4, creatinine 1.6 Labs (5/17): K 4.4, creatinine 1.65, LFTs normal, TSH mildly elevated, free T3 and free T4 normal, BNP 502, hemoglobin 12.3 Labs (6/17): BNP 737, K 4.4, creatinine 1.72 Labs (7/17): LDL 136, K 4.9, creatinine 1.79 Labs (8/17): BNP 239, K 3.8, creatinine 2.17 Labs (10/17): LDL 74, HDL 34, LFTs normal Labs (1/76): K  5, creatinine 1.67, BNP 774 Labs (6/18): K 3.3, creatinine 1.82 Labs 02/06/2017: K 4.3 Creatinine 1.78.   PMH: 1. CAD: Late presentation inferior MI in 3/17.  - LHC showed totally occluded pRCA with L=>R collaterals, 95% pLAD, 70% ramus.   - CABG with LIMA-LAD, SVG-D, SVG-ramus, SVG-RCA.  - Repeat echo in 3/17 after CABG showed patent LIMA and SVG-RCA but SVG-D and SVG- ramus had slow flow with small, diffusely diseased distal targets.  - Echo (6/17) with EF 40-45%, inferior severe hypokinesis, mildly dilated RV with normal systolic function, PASP 30 mmHg.  2. Chronic systolic CHF: Ischemic cardiomyopathy.   - Echo (3/17) with EF 30-35%, septal and inferior AK.   3. RV failure: was on Revatio, now off.  4. CKD: Stage 3.  5. Atrial flutter: Transient, post-op.   6. VT: Post-op CABG.  7. Carotid stenosis: Right CEA 3/17.  8. PAD: Peripheral artery dopplers (7/17) with > 50% L CIA stenosis, occluded distal external iliac, occluded right SFA, occluded left SFA.  9. 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   Family History  Problem Relation Age of Onset  . Heart disease Mother   . Failure to thrive Father   . Congestive Heart Failure Father        Deceased  . Breast cancer Sister        Living   ROS: All  systems reviewed and negative except as per HPI.   Current Outpatient Prescriptions  Medication Sig Dispense Refill  . apixaban (ELIQUIS) 5 MG TABS tablet Take 1 tablet (5 mg total) by mouth 2 (two) times daily. 60 tablet 6  . carvedilol (COREG) 3.125 MG tablet Take 1 tablet (3.125 mg total) by mouth 2 (two) times daily. Start if BP above 120's/ 80's 60 tablet 2  . DULoxetine (CYMBALTA) 30 MG capsule Take 30 mg by mouth daily.    . gabapentin (NEURONTIN) 300 MG capsule Take 1 capsule (300 mg total) by mouth 2 (two) times daily. 60 capsule 1  . hydrALAZINE (APRESOLINE) 25 MG tablet Take 1 tablet (25 mg total) by mouth every 8  (eight) hours. 90 tablet 0  . isosorbide mononitrate (IMDUR) 30 MG 24 hr tablet Take 1 tablet (30 mg total) by mouth daily. 30 tablet 0  . potassium chloride SA (K-DUR,KLOR-CON) 20 MEQ tablet Take 2 tablets (40 mEq total) by mouth daily. 30 tablet 0  . QUEtiapine (SEROQUEL) 25 MG tablet Take 12.5-25 mg by mouth at bedtime.     . ranitidine (ZANTAC) 150 MG tablet Take 150 mg by mouth 2 (two) times daily.    . rosuvastatin (CRESTOR) 40 MG tablet TAKE 1 TABLET BY MOUTH ONCE DAILY 30 tablet 1  . senna-docusate (SENOKOT-S) 8.6-50 MG tablet Take 1 tablet by mouth at bedtime. For constipation 30 tablet 0  . torsemide (DEMADEX) 20 MG tablet Take 2 tablets (40 mg total) by mouth daily. Start on 03/26/17 60 tablet 3   No current facility-administered medications for this encounter.    BP (!) 138/94   Pulse (!) 59   Wt 266 lb 3.2 oz (120.7 kg)   SpO2 97%   BMI 38.20 kg/m   Filed Weights   04/11/17 1433  Weight: 266 lb 3.2 oz (120.7 kg)   General: Well appearing. No resp difficulty. HEENT: Normal Neck: Supple. JVP 5-6. Carotids 2+ bilat; no bruits. No thyromegaly or nodule noted. Cor: PMI nondisplaced. RRR, No M/G/R noted Lungs: CTAB, normal effort. Abdomen: Obese,soft, non-tender, non-distended, no HSM. No bruits or masses. +BS  Extremities: No cyanosis, clubbing, rash, R and LLE no edema.  Neuro: Alert & orientedx3, cranial nerves grossly intact. moves all 4 extremities w/o difficulty. Affect pleasant    Assessment/Plan: 1. CAD: s/p CABG - Denies chest pain. Continue Crestor. No ASA with Eliquis.   2. Atrial flutter: Initially occurred post operatively and was transient. Noted to be in Aflutter at office visit in July and again at recent hospitalization. Rate controlled today. Plan for DCCV in 3 weeks after a full month of anticoagulation. He has not missed any doses of Eliquis. Discussed with Dr. McLean, will hold off on increasing Coreg with V rate of 59 bpm.   3. RV failure: Echo in  6/17 showed RV mildly dilated with normal systolic function.   - Off Revatio  4. Chronic systolic CHF: Ischemic cardiomyopathy.  Echo in 3/17 with EF 30-35%, improved to 40-45% on echo in 6/17. Echo 02/2017 EF 40-45%.  - NYHA II - Continue Coreg 3.125 mg BID - Continue Hydralazine 25 mg TID - Continue Imdur 30 mg daily - Continue torsemide 40 mg dialy - He is off Spiro and ARB with CKD. Can try to add back gently in the future after restoring NSR.   5. VT: - no further. Off Amiodarone.   6. CKD stage III: - Creatinine 2.52 at discharge. Will check BMET today.   7.   Carotid stenosis: - s/p R CEA in 2017 - Continue Statin  8. Hyperlipidemia:  - Continue Crestor.   9. PAD: Last visit with Dr. Arbie Cookey characterized his PAD as mild. No claudication symptoms today.   DCCV in 3 weeks. Follow up with Dr. Shirlee Latch in one month.    Little Ishikawa NP-C  04/11/2017

## 2017-04-11 NOTE — Patient Instructions (Addendum)
You have been scheduled for a cardioversion. Please see attached instruction sheet for additional details.  Routine lab work today. Will notify you of abnormal results, otherwise no news is good news!  Follow up with Dr. Shirlee Latch in 1 month.  _____________________________________________________________  _____________________________________________________________  Take all medication as prescribed the day of your appointment. Bring all medications with you to your appointment.  Do the following things EVERYDAY: 1) Weigh yourself in the morning before breakfast. Write it down and keep it in a log. 2) Take your medicines as prescribed 3) Eat low salt foods-Limit salt (sodium) to 2000 mg per day.  4) Stay as active as you can everyday 5) Limit all fluids for the day to less than 2 liters

## 2017-04-13 ENCOUNTER — Telehealth (HOSPITAL_COMMUNITY): Payer: Self-pay

## 2017-04-13 NOTE — Telephone Encounter (Signed)
Pt called wondering if we should keep his appt with EP on 9/17 due to being scheduled for cardioversion on 9/27. Per notes Shirlee Latch referred pt to EP for care for A-flutter. Pt. Made aware to keep appt.

## 2017-04-15 ENCOUNTER — Telehealth: Payer: Self-pay | Admitting: Cardiology

## 2017-04-15 NOTE — Telephone Encounter (Signed)
Pt called due to wt up 2 lbs, no swelling no change in meds for now but if wt higher tomorrow. He will call.

## 2017-04-18 ENCOUNTER — Encounter (HOSPITAL_COMMUNITY): Payer: Medicaid Other | Admitting: Cardiology

## 2017-04-19 NOTE — Progress Notes (Signed)
Established Carotid Patient   History of Present Illness   Christopher [name] is a 56 y.o. (08-Feb-1961) male who presents with chief complaint: funny heart beat.  Pt is scheduled for cardioversion later this month.  Pt had R CEA with BPA (10/27/15) combined CABG.  Pt has been lost to follow-up due to complicated recovery.  Previous carotid studies demonstrated: asx RICA >80% stenosis, asx LICA 1-39% stenosis.  Patient has no history of TIA or stroke symptom.  The patient has never had amaurosis fugax or monocular blindness.  The patient has never had facial drooping or hemiplegia.  The patient has never had receptive or expressive aphasia.    The patient's PMH, PSH, SH, and FamHx are unchanged from 03/29/16.   Current Outpatient Prescriptions  Medication Sig Dispense Refill  . apixaban (ELIQUIS) 5 MG TABS tablet Take 1 tablet (5 mg total) by mouth 2 (two) times daily. 60 tablet 6  . carvedilol (COREG) 3.125 MG tablet Take 1 tablet (3.125 mg total) by mouth 2 (two) times daily. Start if BP above 120's/ 80's 60 tablet 2  . DULoxetine (CYMBALTA) 30 MG capsule Take 30 mg by mouth daily.    Marland Kitchen gabapentin (NEURONTIN) 300 MG capsule Take 1 capsule (300 mg total) by mouth 2 (two) times daily. 60 capsule 1  . hydrALAZINE (APRESOLINE) 25 MG tablet Take 1 tablet (25 mg total) by mouth every 8 (eight) hours. 90 tablet 0  . isosorbide mononitrate (IMDUR) 30 MG 24 hr tablet Take 1 tablet (30 mg total) by mouth daily. 30 tablet 0  . potassium chloride SA (K-DUR,KLOR-CON) 20 MEQ tablet Take 2 tablets (40 mEq total) by mouth daily. 30 tablet 0  . QUEtiapine (SEROQUEL) 25 MG tablet Take 12.5-25 mg by mouth at bedtime.     . ranitidine (ZANTAC) 150 MG tablet Take 150 mg by mouth 2 (two) times daily.    . rosuvastatin (CRESTOR) 40 MG tablet TAKE 1 TABLET BY MOUTH ONCE DAILY 30 tablet 1  . senna-docusate (SENOKOT-S) 8.6-50 MG tablet Take 1 tablet by mouth at bedtime. For constipation 30 tablet 0  . torsemide  (DEMADEX) 20 MG tablet Take 2 tablets (40 mg total) by mouth daily. Start on 03/26/17 60 tablet 3   No current facility-administered medications for this visit.     On ROS today: no CVA or TIA, normal swallow   Physical Examination   Vitals:   04/21/17 1424  BP: (!) 121/91  Pulse: (!) 50  Resp: 20  Temp: 97.9 F (36.6 C)  TempSrc: Oral  SpO2: 98%  Weight: 268 lb 1.6 oz (121.6 kg)  Height:  (1.778 m)   Body mass index is 38.47 kg/m.  General Alert, O x 3, Obese, NAD  Neck Supple, mid-line trachea, healed tracheostomy,  Neck incision healed  Pulmonary Sym exp, good B air movt, CTA B  Cardiac RRR, Nl S1, S2, no Murmurs, No rubs, No S3,S4  Vascular Vessel Right Left  Radial Palpable Palpable  Brachial Palpable Palpable  Carotid Palpable, No Bruit Palpable, No Bruit  Aorta Not palpable N/A  Femoral Palpable Palpable  Popliteal Not palpable Not palpable  PT Faintly palpable Faintly palpable  DP Not palpable Not palpable    Gastro- intestinal soft, non-distended, non-tender to palpation, No guarding or rebound, no HSM, no masses, no CVAT B, No palpable prominent aortic pulse, large pannus    Musculo- skeletal M/S 5/5 throughout  , Extremities without ischemic changes    Neurologic Cranial nerves  2-12 intact , Pain and light touch intact in extremities , Motor exam as listed above    Non-Invasive Vascular Imaging   B Carotid Duplex (04/21/2017 ):   R ICA stenosis:  patent CEA  R VA: patent and antegrade  L ICA stenosis:  1-39%  L VA: patent and antegrade   Medical Decision Making   Christopher [name] is a 56 y.o. male who presents with: R CEA for asx ICA stenosis >80%, asx L ICA stenosis >80%   Based on the patient's vascular studies and examination, I have offered the patient: annual B carotid duplex.  I discussed in depth with the patient the nature of atherosclerosis, and emphasized the importance of maximal medical management including strict control  of blood pressure, blood glucose, and lipid levels, antiplatelet agents, obtaining regular exercise, and cessation of smoking.    The patient is aware that without maximal medical management the underlying atherosclerotic disease process will progress, limiting the benefit of any interventions. The patient is currently on a statin: Crestor.  The patient is currently not on an anti-platelet due to bleeding risks related to use of an anticoagulant.  Pt is on Eliquis.  Thank you for allowing [place] to participate in this patient's care.   Leonides Sake, MD, FACS [facility] Office: [phone] Pager: [phone]

## 2017-04-20 ENCOUNTER — Encounter (HOSPITAL_COMMUNITY): Payer: Self-pay

## 2017-04-20 NOTE — Progress Notes (Signed)
Disability/FMLA faxed on behalf of patient's primary caregiver on 04/19/2017. Written out for most recent hospitalizations, intermittent leave for 1-2 appointments monthly up to 4 hrs, and intermittently for flare ups once monthly for up to 5 days. Copy of forms scanned into patient's electronic medical record.  Ave Filter, RN

## 2017-04-21 ENCOUNTER — Ambulatory Visit (INDEPENDENT_AMBULATORY_CARE_PROVIDER_SITE_OTHER): Payer: Medicaid Other | Admitting: Vascular Surgery

## 2017-04-21 ENCOUNTER — Ambulatory Visit (HOSPITAL_COMMUNITY)
Admission: RE | Admit: 2017-04-21 | Discharge: 2017-04-21 | Disposition: A | Discharge status: Home / Self Care | Payer: Medicaid Other | Source: Ambulatory Visit | Attending: Vascular Surgery | Admitting: Vascular Surgery

## 2017-04-21 ENCOUNTER — Encounter: Payer: Self-pay | Admitting: Vascular Surgery

## 2017-04-21 VITALS — BP 121/91 | HR 50 | Temp 97.9°F | Resp 20 | Ht 70.0 in | Wt 268.1 lb

## 2017-04-21 DIAGNOSIS — G451 Carotid artery syndrome (hemispheric): Secondary | ICD-10-CM | POA: Diagnosis not present

## 2017-04-21 DIAGNOSIS — I739 Peripheral vascular disease, unspecified: Principal | ICD-10-CM

## 2017-04-21 DIAGNOSIS — I6529 Occlusion and stenosis of unspecified carotid artery: Secondary | ICD-10-CM | POA: Diagnosis present

## 2017-04-21 DIAGNOSIS — I779 Disorder of arteries and arterioles, unspecified: Secondary | ICD-10-CM

## 2017-04-21 LAB — VAS US CAROTID
LCCADSYS: -93 cm/s
LCCAPDIAS: 25 cm/s
LEFT ECA DIAS: -16 cm/s
LEFT VERTEBRAL DIAS: 19 cm/s
Left CCA dist dias: -26 cm/s
Left CCA prox sys: 113 cm/s
Left ICA prox dias: -30 cm/s
Left ICA prox sys: -145 cm/s
RCCAPDIAS: 23 cm/s
RIGHT CCA MID DIAS: 23 cm/s
RIGHT ECA DIAS: -14 cm/s
RIGHT VERTEBRAL DIAS: -11 cm/s
Right CCA prox sys: 94 cm/s

## 2017-04-21 MED FILL — POTASSIUM CL ER 20 MEQ TABL: 20 | 30 days supply | Qty: 60 | Fill #1

## 2017-04-24 ENCOUNTER — Ambulatory Visit (INDEPENDENT_AMBULATORY_CARE_PROVIDER_SITE_OTHER): Payer: Medicaid Other | Admitting: Internal Medicine

## 2017-04-24 ENCOUNTER — Encounter: Payer: Self-pay | Admitting: Internal Medicine

## 2017-04-24 ENCOUNTER — Ambulatory Visit: Payer: Medicaid Other | Admitting: Internal Medicine

## 2017-04-24 VITALS — BP 118/80 | HR 58 | Ht 70.5 in | Wt 269.8 lb

## 2017-04-24 DIAGNOSIS — I4892 Unspecified atrial flutter: Secondary | ICD-10-CM | POA: Diagnosis not present

## 2017-04-24 DIAGNOSIS — I5022 Chronic systolic (congestive) heart failure: Secondary | ICD-10-CM | POA: Diagnosis not present

## 2017-04-24 NOTE — Patient Instructions (Signed)
Medication Instructions:  Your physician recommends that you continue on your current medications as directed. Please refer to the Current Medication list given to you today.   Labwork: None ordered   Testing/Procedures: None ordered   Follow-Up: Your physician wants you to follow-up : As needed with Dr. Allred   Any Other Special Instructions Will Be Listed Below (If Applicable).     If you need a refill on your cardiac medications before your next appointment, please call your pharmacy.   

## 2017-04-24 NOTE — Progress Notes (Signed)
Electrophysiology Office Note   Date:  04/24/2017   ID:  Christopher [name], DOB 1961-04-09, MRN [id number]  PCP:  Shirlean Mylar, MD  Cardiologist:  Dr Shirlee Latch Primary Electrophysiologist: Dr Graciela Husbands  Chief Complaint  Patient presents with  . Atrial Flutter     History of Present Illness: Christopher [name] is a 56 y.o. male who presents today for electrophysiology evaluation.   The patient presents today for further evaluation of atrial flutter.  He first had typical appearing atrial flutter after MI 3/17.  This spontaneously resolved.  Of note, with his MI, he also had transient complete heart block and VT.  More recently, he had atypical appearing atrial flutter 7/18.  He was noted to have fatigue.  He was evaluated by Dr Graciela Husbands.  He and Dr Shirlee Latch have discussed and he presents today for further consideration of ablation. Of note, the patient has cardioversion planned for 05/04/17 by Dr Shirlee Latch.  Today, he denies symptoms of palpitations, chest pain, shortness of breath, orthopnea, PND, lower extremity edema, claudication, dizziness, presyncope, syncope, bleeding, or neurologic sequela. The patient is tolerating medications without difficulties and is otherwise without complaint today.    Past Medical History:  Diagnosis Date  . Anxiety   . Atypical atrial flutter (HCC)   . CHF (congestive heart failure) (HCC)    EF 45%  . CKD (chronic kidney disease), stage III    Hattie Perch 03/23/2017  . Complete heart block (HCC) 10/20/2015   resolved  . COPD (chronic obstructive pulmonary disease) (HCC)    pt denies this hx on 03/23/2017  . Coronary artery disease   . High cholesterol   . Hypertension   . Myocardial infarction [facility]) 10/2015   "massive one; saw scar tissue of possible previous MI maybe in 2013" (03/23/2017)  . Stenosis of right carotid artery   . TIA (transient ischemic attack) 2016   Past Surgical History:  Procedure Laterality Date  . CARDIAC CATHETERIZATION N/A 10/20/2015   Procedure: Left Heart Cath and Coronary Angiography;  Surgeon: Tonny Bollman, MD;  Location: [facility] INVASIVE CV LAB;  Service: Cardiovascular;  Laterality: N/A;  . CARDIAC CATHETERIZATION N/A 11/02/2015   Procedure: Right/Left Heart Cath and Coronary/Graft Angiography;  Surgeon: Laurey Morale, MD;  Location: [facility] INVASIVE CV LAB;  Service: Cardiovascular;  Laterality: N/A;  . CORONARY ARTERY BYPASS GRAFT N/A 10/27/2015   Procedure: CORONARY ARTERY BYPASS GRAFTING (CABG) x four, using left internal mammary artery and rt leg greater saphenous vein harvested endoscopically;  Surgeon: Kerin Perna, MD;  Location: [facility] OR;  Service: Open Heart Surgery;  Laterality: N/A;  . ENDARTERECTOMY Right 10/27/2015   Procedure: RIGHT ENDARTERECTOMY CAROTID with patch angioplasty using Xenosure bovine pericardium patch;  Surgeon: Fransisco Hertz, MD;  Location: [facility] OR;  Service: Vascular;  Laterality: Right;  . EYE MUSCLE SURGERY Left 1970s   "lazy eye"  . LACERATION REPAIR Right 2003   RUE; "fell thru plate-glass window"  . TEE WITHOUT CARDIOVERSION N/A 10/27/2015   Procedure: TRANSESOPHAGEAL ECHOCARDIOGRAM (TEE);  Surgeon: Kerin Perna, MD;  Location: [facility] OR;  Service: Open Heart Surgery;  Laterality: N/A;     Current Outpatient Prescriptions  Medication Sig Dispense Refill  . apixaban (ELIQUIS) 5 MG TABS tablet Take 1 tablet (5 mg total) by mouth 2 (two) times daily. 60 tablet 6  . carvedilol (COREG) 3.125 MG tablet Take 1 tablet (3.125 mg total) by mouth 2 (two) times daily. Start if BP above 120's/ 80's 60 tablet 2  .  DULoxetine (CYMBALTA) 30 MG capsule Take 30 mg by mouth daily.    Marland Kitchen gabapentin (NEURONTIN) 300 MG capsule Take 1 capsule (300 mg total) by mouth 2 (two) times daily. 60 capsule 1  . hydrALAZINE (APRESOLINE) 25 MG tablet Take 1 tablet (25 mg total) by mouth every 8 (eight) hours. 90 tablet 0  . isosorbide mononitrate (IMDUR) 30 MG 24 hr tablet Take 1 tablet (30 mg total) by mouth daily. 30 tablet 0    . potassium chloride SA (K-DUR,KLOR-CON) 20 MEQ tablet Take 2 tablets (40 mEq total) by mouth daily. 30 tablet 0  . QUEtiapine (SEROQUEL) 25 MG tablet Take 12.5-25 mg by mouth at bedtime.     . ranitidine (ZANTAC) 150 MG tablet Take 150 mg by mouth 2 (two) times daily.    . rosuvastatin (CRESTOR) 40 MG tablet TAKE 1 TABLET BY MOUTH ONCE DAILY 30 tablet 1  . senna-docusate (SENOKOT-S) 8.6-50 MG tablet Take 1 tablet by mouth at bedtime. For constipation 30 tablet 0  . torsemide (DEMADEX) 20 MG tablet Take 2 tablets (40 mg total) by mouth daily. Start on 03/26/17 60 tablet 3   No current facility-administered medications for this visit.     Allergies:   Losartan; Fortaz [ceftazidime]; and Lisinopril   Social History:  The patient  reports that he quit smoking about 14 years ago. His smoking use included Cigarettes. He has a 23.00 pack-year smoking history. He has never used smokeless tobacco. He reports that he uses drugs, including Marijuana. He reports that he does not drink alcohol.   Family History:  The patient's  family history includes Breast cancer in his sister; Congestive Heart Failure in his father; Failure to thrive in his father; Heart disease in his mother.    ROS:  Please see the history of present illness.   All other systems are personally reviewed and negative.    PHYSICAL EXAM: VS:  BP 118/80   Pulse (!) 58   Ht 5' 10.5" (1.791 m)   Wt 269 lb 12.8 oz (122.4 kg)   SpO2 97%   BMI 38.17 kg/m  , BMI Body mass index is 38.17 kg/m. GEN: overweight in no acute distress  HEENT: normal  Neck: no JVD, carotid bruits, or masses Cardiac: iRRR; no murmurs, rubs, or gallops,no edema  Respiratory:  clear to auscultation bilaterally, normal work of breathing GI: soft, nontender, nondistended, + BS MS: no deformity or atrophy  Skin: warm and dry  Neuro:  Strength and sensation are intact Psych: euthymic mood, full affect  EKG:  EKG is ordered today. The ekg ordered today is  personally reviewed and shows atypical appearing atrial flutter with V rate 58 bpm, the atrial flutter waves are positive in the inferior leads and negative in V1   Recent Labs: 03/28/2017: B Natriuretic Peptide 1,836.9 03/31/2017: ALT 21 04/02/2017: Hemoglobin 10.5; Platelets 117 04/03/2017: Magnesium 2.1 04/11/2017: BUN 29; Creatinine, Ser 2.17; Potassium 3.8; Sodium 139  personally reviewed   Lipid Panel     Component Value Date/Time   CHOL 146 05/23/2016 0853   TRIG 192 (H) 05/23/2016 0853   HDL 34 (L) 05/23/2016 0853   CHOLHDL 4.3 05/23/2016 0853   VLDL 38 05/23/2016 0853   LDLCALC 74 05/23/2016 0853   personally reviewed   Wt Readings from Last 3 Encounters:  04/24/17 269 lb 12.8 oz (122.4 kg)  04/21/17 268 lb 1.6 oz (121.6 kg)  04/11/17 266 lb 3.2 oz (120.7 kg)      Other studies  personally reviewed: Additional studies/ records that were reviewed today include: Dr Graciela Husbands and [facility] notes, prior ekgs, echos  Review of the above records today demonstrates: as above   ASSESSMENT AND PLAN:  1.  Atrial flutter Though atypical appearing by ekg, I suspect that this is isthmus dependant.  I have advised ablation. Risks of ablation discussed at length with the patient and his spouse.  He states "no disrespect to you doctor, but Dr Shirlee Latch is my doctor.  I only do what he tells me".  As I tried to explain that he had been referred by Drs Graciela Husbands and Shirlee Latch, he is clear that he does not wish to pursue ablation currently.  He would prefer to proceed with cardioversion as scheduled.  He may be more willing to consider ablation if his atrial flutter recurs.  Chads2vasc score is at least 5.  Will need to continue long term anticoagulation with eliquis unless he has atrial flutter ablation. Given prior TIA and atrial arrhythmias, he may be best to stay on long term anticoagulation even if he has ablation, or at least consider ILR guided therapy with long term monitoring for recurrent atrial  arrhythmias  We also discussed lifestyle modification at length today including regular exercise and weight loss.  2. Obesity Body mass index is 38.17 kg/m. Lifestyle modification encouraged  3. HTN Stable No change required today  4. Chronic systolic dysfunction euvolemic No changes today   Follow-up with me as needed.  He should contact my office if he decides to proceed with ablation.  Otherwise, he can follow with Dr Shirlee Latch going forward.  Current medicines are reviewed at length with the patient today.   The patient does not have concerns regarding his medicines.  The following changes were made today:  none   Signed, Hillis Range, MD  04/24/2017 3:06 PM     [facility] [address] [place] [address] [phone] (office) [phone] (fax)

## 2017-04-25 ENCOUNTER — Other Ambulatory Visit (HOSPITAL_COMMUNITY): Payer: Self-pay | Admitting: *Deleted

## 2017-04-25 MED ORDER — ISOSORBIDE MONONITRATE ER 30 MG PO TB24: 30.0000 mg | ORAL_TABLET | Freq: Every day | ORAL | 3 refills | Status: DC

## 2017-04-25 MED ORDER — HYDRALAZINE HCL 25 MG PO TABS: 25.0000 mg | ORAL_TABLET | Freq: Three times a day (TID) | ORAL | 3 refills | Status: DC

## 2017-05-04 ENCOUNTER — Ambulatory Visit (HOSPITAL_COMMUNITY): Payer: Medicaid Other | Admitting: Anesthesiology

## 2017-05-04 ENCOUNTER — Ambulatory Visit (HOSPITAL_COMMUNITY)
Admission: RE | Admit: 2017-05-04 | Discharge: 2017-05-04 | Disposition: A | Discharge status: Home / Self Care | Payer: Medicaid Other | Source: Ambulatory Visit | Attending: Cardiology | Admitting: Cardiology

## 2017-05-04 ENCOUNTER — Encounter (HOSPITAL_COMMUNITY): Payer: Self-pay

## 2017-05-04 ENCOUNTER — Encounter (HOSPITAL_COMMUNITY)
Admission: RE | Disposition: A | Discharge status: Home / Self Care | Payer: Self-pay | Source: Ambulatory Visit | Attending: Cardiology

## 2017-05-04 DIAGNOSIS — Z6837 Body mass index (BMI) 37.0-37.9, adult: Secondary | ICD-10-CM | POA: Insufficient documentation

## 2017-05-04 DIAGNOSIS — Z888 Allergy status to other drugs, medicaments and biological substances status: Secondary | ICD-10-CM | POA: Diagnosis not present

## 2017-05-04 DIAGNOSIS — Z79899 Other long term (current) drug therapy: Secondary | ICD-10-CM | POA: Diagnosis not present

## 2017-05-04 DIAGNOSIS — G629 Polyneuropathy, unspecified: Secondary | ICD-10-CM | POA: Diagnosis not present

## 2017-05-04 DIAGNOSIS — Z7901 Long term (current) use of anticoagulants: Secondary | ICD-10-CM | POA: Diagnosis not present

## 2017-05-04 DIAGNOSIS — I739 Peripheral vascular disease, unspecified: Secondary | ICD-10-CM | POA: Diagnosis not present

## 2017-05-04 DIAGNOSIS — I255 Ischemic cardiomyopathy: Secondary | ICD-10-CM | POA: Insufficient documentation

## 2017-05-04 DIAGNOSIS — I130 Hypertensive heart and chronic kidney disease with heart failure and stage 1 through stage 4 chronic kidney disease, or unspecified chronic kidney disease: Secondary | ICD-10-CM | POA: Diagnosis not present

## 2017-05-04 DIAGNOSIS — I2510 Atherosclerotic heart disease of native coronary artery without angina pectoris: Secondary | ICD-10-CM | POA: Diagnosis not present

## 2017-05-04 DIAGNOSIS — E785 Hyperlipidemia, unspecified: Secondary | ICD-10-CM | POA: Insufficient documentation

## 2017-05-04 DIAGNOSIS — I5022 Chronic systolic (congestive) heart failure: Secondary | ICD-10-CM | POA: Insufficient documentation

## 2017-05-04 DIAGNOSIS — Z87891 Personal history of nicotine dependence: Secondary | ICD-10-CM | POA: Insufficient documentation

## 2017-05-04 DIAGNOSIS — J449 Chronic obstructive pulmonary disease, unspecified: Secondary | ICD-10-CM | POA: Insufficient documentation

## 2017-05-04 DIAGNOSIS — I252 Old myocardial infarction: Secondary | ICD-10-CM | POA: Diagnosis not present

## 2017-05-04 DIAGNOSIS — Z951 Presence of aortocoronary bypass graft: Secondary | ICD-10-CM | POA: Diagnosis not present

## 2017-05-04 DIAGNOSIS — N183 Chronic kidney disease, stage 3 (moderate): Secondary | ICD-10-CM | POA: Diagnosis not present

## 2017-05-04 DIAGNOSIS — Z8249 Family history of ischemic heart disease and other diseases of the circulatory system: Secondary | ICD-10-CM | POA: Diagnosis not present

## 2017-05-04 DIAGNOSIS — I77819 Aortic ectasia, unspecified site: Secondary | ICD-10-CM | POA: Diagnosis not present

## 2017-05-04 DIAGNOSIS — I4892 Unspecified atrial flutter: Secondary | ICD-10-CM | POA: Diagnosis not present

## 2017-05-04 HISTORY — PX: CARDIOVERSION: SHX1299

## 2017-05-04 LAB — COMPREHENSIVE METABOLIC PANEL
ALBUMIN: 4.2 g/dL (ref 3.5–5.0)
ALT: 22 U/L (ref 17–63)
AST: 31 U/L (ref 15–41)
Alkaline Phosphatase: 83 U/L (ref 38–126)
Anion gap: 12 (ref 5–15)
BUN: 17 mg/dL (ref 6–20)
CHLORIDE: 102 mmol/L (ref 101–111)
CO2: 26 mmol/L (ref 22–32)
Calcium: 9.7 mg/dL (ref 8.9–10.3)
Creatinine, Ser: 1.73 mg/dL — ABNORMAL HIGH (ref 0.61–1.24)
GFR calc Af Amer: 49 mL/min — ABNORMAL LOW (ref >60)
GFR, EST NON AFRICAN AMERICAN: 43 mL/min — AB (ref >60)
Glucose, Bld: 104 mg/dL — ABNORMAL HIGH (ref 65–99)
POTASSIUM: 4.1 mmol/L (ref 3.5–5.1)
Sodium: 140 mmol/L (ref 135–145)
Total Bilirubin: 1.5 mg/dL — ABNORMAL HIGH (ref 0.3–1.2)
Total Protein: 7.6 g/dL (ref 6.5–8.1)

## 2017-05-04 LAB — PROTIME-INR
INR: 1.6
PROTHROMBIN TIME: 18.9 s — AB (ref 11.4–15.2)

## 2017-05-04 LAB — MAGNESIUM: MAGNESIUM: 2 mg/dL (ref 1.7–2.4)

## 2017-05-04 SURGERY — CARDIOVERSION
Anesthesia: General

## 2017-05-04 MED ORDER — HYDROCORTISONE 1 % EX CREA: 1.0000 "application " | TOPICAL_CREAM | Freq: Three times a day (TID) | CUTANEOUS | Status: DC | PRN

## 2017-05-04 MED ORDER — SODIUM CHLORIDE 0.9 % IV SOLN
250.0000 mL | INTRAVENOUS | Status: DC
  Administered 2017-05-04 (×2): via INTRAVENOUS

## 2017-05-04 MED ORDER — PROPOFOL 10 MG/ML IV BOLUS
INTRAVENOUS | Status: DC | PRN
  Administered 2017-05-04: 80 mg via INTRAVENOUS

## 2017-05-04 MED ORDER — LIDOCAINE HCL (CARDIAC) 20 MG/ML IV SOLN
INTRAVENOUS | Status: DC | PRN
  Administered 2017-05-04: 100 mg via INTRATRACHEAL

## 2017-05-04 MED ORDER — SODIUM CHLORIDE 0.9% FLUSH: 3.0000 mL | INTRAVENOUS | Status: DC | PRN

## 2017-05-04 MED ORDER — SODIUM CHLORIDE 0.9 % IV SOLN: INTRAVENOUS | Status: DC

## 2017-05-04 MED ORDER — SODIUM CHLORIDE 0.9% FLUSH
3.0000 mL | Freq: Two times a day (BID) | INTRAVENOUS | Status: DC
Start: 2017-05-04 — End: 2017-05-04

## 2017-05-04 NOTE — Procedures (Addendum)
Electrical Cardioversion Procedure Note JARAN MENCH [id number] [birth year]  Procedure: Electrical Cardioversion Indications:  Atrial Flutter. He has been on apixaban without missing doses.   Procedure Details Consent: Risks of procedure as well as the alternatives and risks of each were explained to the (patient/caregiver).  Consent for procedure obtained. Time Out: Verified patient identification, verified procedure, site/side was marked, verified correct patient position, special equipment/implants available, medications/allergies/relevent history reviewed, required imaging and test results available.  Performed  Patient placed on cardiac monitor, pulse oximetry, supplemental oxygen as necessary.  Sedation given: Propofol per anesthesiology Pacer pads placed anterior and posterior chest.  Cardioverted 1 time(s).  Cardioverted at 150J.  Evaluation Findings: Post procedure EKG shows: NSR Complications: None Patient did tolerate procedure well.   [name] 05/04/2017, 12:29 PM

## 2017-05-04 NOTE — Transfer of Care (Signed)
Immediate Anesthesia Transfer of Care Note  Patient: Christopher [name]  Procedure(s) Performed: Procedure(s): CARDIOVERSION (N/A)  Patient Location: PACU  Anesthesia Type:General  Level of Consciousness: awake, alert , oriented and patient cooperative  Airway & Oxygen Therapy: Patient Spontanous Breathing and Patient connected to nasal cannula oxygen  Post-op Assessment: Report given to RN and Post -op Vital signs reviewed and stable  Post vital signs: Reviewed and stable  Last Vitals:  Vitals:   05/04/17 1019 05/04/17 1233  BP: (!) 172/100 (!) 147/84  Pulse: (!) 56 63  Resp: 16 17  Temp: 36.4 C   SpO2: 97% 98%    Last Pain:  Vitals:   05/04/17 1233  TempSrc: Oral         Complications: No apparent anesthesia complications

## 2017-05-04 NOTE — Anesthesia Postprocedure Evaluation (Signed)
Anesthesia Post Note  Patient: Christopher [name]  Procedure(s) Performed: Procedure(s) (LRB): CARDIOVERSION (N/A)     Patient location during evaluation: Endoscopy Anesthesia Type: General Level of consciousness: awake and alert, oriented and patient cooperative Pain management: pain level controlled Vital Signs Assessment: post-procedure vital signs reviewed and stable Respiratory status: spontaneous breathing and nonlabored ventilation Cardiovascular status: blood pressure returned to baseline and stable Postop Assessment: no headache, no apparent nausea or vomiting and adequate PO intake Anesthetic complications: no    Last Vitals:  Vitals:   05/04/17 1019 05/04/17 1233  BP: (!) 172/100 (!) 147/84  Pulse: (!) 56 63  Resp: 16 17  Temp: 36.4 C   SpO2: 97% 98%    Last Pain:  Vitals:   05/04/17 1233  TempSrc: Oral                 Najat Olazabal

## 2017-05-04 NOTE — H&P (View-Only) (Signed)
Patient ID: Christopher [name], male   DOB: 20-Apr-1961, 56 y.o.   MRN: [id number]   PCP: Dr. Hyman Hopes Cardiology: Dr. Shirlee Latch Vascular: Dr Early  56 yo with history of CAD s/p CABG, ischemic cardiomyopathy, and CKD presents for cardiology followup.  In 3/17, he came to the hospital with a late presentation inferior MI.  He was noted to have occluded RCA with collaterals and 95% proximal LAD.  He had CABG x 4 + right CEA.  He had a balloon pump with cardiogenic shock.  He additionally had multiple episodes of post-op VT/VF.  Amiodarone was started.  Repeat cath showed slow flow in the SVG-D and SVG-ramus given diffusely diseased and small distal targets.  He developed AKI.  He had prolonged mechanical ventilation and a tracheostomy.  He had RV failure that was treated with Revatio.  He had transient atrial flutter that resolved quickly.  Last echo in 3/17 showed EF 30-35%.  Finally, the tracheostomy was removed.  He went to inpatient rehab for about a week and is now home again.  Echo 6/17 with EF 40-45%.   Admitted 8/21-8/27/18 with acute respiratory failure due to HCAP and HTN emergency. Also had rapid atrial flutter. Rate improved and DCCV was planned for outpatient as he had missed a few doses of Eliquis at home. Discharge weight was 270 pounds.   He returns today for HF follow up. Says that he feels great, better than he has felt in months. Denies SOB with activity, no SOB with walking in the neighborhood. He is taking all medications and watching his salt intake. Weight at home is stable, down 4 pounds from discharge weight.   ECG: A flutter 59 bpm.   Labs (4/17): K 3.4, creatinine 1.6 Labs (5/17): K 4.4, creatinine 1.65, LFTs normal, TSH mildly elevated, free T3 and free T4 normal, BNP 502, hemoglobin 12.3 Labs (6/17): BNP 737, K 4.4, creatinine 1.72 Labs (7/17): LDL 136, K 4.9, creatinine 1.79 Labs (8/17): BNP 239, K 3.8, creatinine 2.17 Labs (10/17): LDL 74, HDL 34, LFTs normal Labs (1/76): K  5, creatinine 1.67, BNP 774 Labs (6/18): K 3.3, creatinine 1.82 Labs 02/06/2017: K 4.3 Creatinine 1.78.   PMH: 1. CAD: Late presentation inferior MI in 3/17.  - LHC showed totally occluded pRCA with L=>R collaterals, 95% pLAD, 70% ramus.   - CABG with LIMA-LAD, SVG-D, SVG-ramus, SVG-RCA.  - Repeat echo in 3/17 after CABG showed patent LIMA and SVG-RCA but SVG-D and SVG- ramus had slow flow with small, diffusely diseased distal targets.  - Echo (6/17) with EF 40-45%, inferior severe hypokinesis, mildly dilated RV with normal systolic function, PASP 30 mmHg.  2. Chronic systolic CHF: Ischemic cardiomyopathy.   - Echo (3/17) with EF 30-35%, septal and inferior AK.   3. RV failure: was on Revatio, now off.  4. CKD: Stage 3.  5. Atrial flutter: Transient, post-op.   6. VT: Post-op CABG.  7. Carotid stenosis: Right CEA 3/17.  8. PAD: Peripheral artery dopplers (7/17) with > 50% L CIA stenosis, occluded distal external iliac, occluded right SFA, occluded left SFA.  9. 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   Family History  Problem Relation Age of Onset  . Heart disease Mother   . Failure to thrive Father   . Congestive Heart Failure Father        Deceased  . Breast cancer Sister        Living   ROS: All  systems reviewed and negative except as per HPI.   Current Outpatient Prescriptions  Medication Sig Dispense Refill  . apixaban (ELIQUIS) 5 MG TABS tablet Take 1 tablet (5 mg total) by mouth 2 (two) times daily. 60 tablet 6  . carvedilol (COREG) 3.125 MG tablet Take 1 tablet (3.125 mg total) by mouth 2 (two) times daily. Start if BP above 120's/ 80's 60 tablet 2  . DULoxetine (CYMBALTA) 30 MG capsule Take 30 mg by mouth daily.    Marland Kitchen gabapentin (NEURONTIN) 300 MG capsule Take 1 capsule (300 mg total) by mouth 2 (two) times daily. 60 capsule 1  . hydrALAZINE (APRESOLINE) 25 MG tablet Take 1 tablet (25 mg total) by mouth every 8  (eight) hours. 90 tablet 0  . isosorbide mononitrate (IMDUR) 30 MG 24 hr tablet Take 1 tablet (30 mg total) by mouth daily. 30 tablet 0  . potassium chloride SA (K-DUR,KLOR-CON) 20 MEQ tablet Take 2 tablets (40 mEq total) by mouth daily. 30 tablet 0  . QUEtiapine (SEROQUEL) 25 MG tablet Take 12.5-25 mg by mouth at bedtime.     . ranitidine (ZANTAC) 150 MG tablet Take 150 mg by mouth 2 (two) times daily.    . rosuvastatin (CRESTOR) 40 MG tablet TAKE 1 TABLET BY MOUTH ONCE DAILY 30 tablet 1  . senna-docusate (SENOKOT-S) 8.6-50 MG tablet Take 1 tablet by mouth at bedtime. For constipation 30 tablet 0  . torsemide (DEMADEX) 20 MG tablet Take 2 tablets (40 mg total) by mouth daily. Start on 03/26/17 60 tablet 3   No current facility-administered medications for this encounter.    BP (!) 138/94   Pulse (!) 59   Wt 266 lb 3.2 oz (120.7 kg)   SpO2 97%   BMI 38.20 kg/m   Filed Weights   04/11/17 1433  Weight: 266 lb 3.2 oz (120.7 kg)   General: Well appearing. No resp difficulty. HEENT: Normal Neck: Supple. JVP 5-6. Carotids 2+ bilat; no bruits. No thyromegaly or nodule noted. Cor: PMI nondisplaced. RRR, No M/G/R noted Lungs: CTAB, normal effort. Abdomen: Obese,soft, non-tender, non-distended, no HSM. No bruits or masses. +BS  Extremities: No cyanosis, clubbing, rash, R and LLE no edema.  Neuro: Alert & orientedx3, cranial nerves grossly intact. moves all 4 extremities w/o difficulty. Affect pleasant    Assessment/Plan: 1. CAD: s/p CABG - Denies chest pain. Continue Crestor. No ASA with Eliquis.   2. Atrial flutter: Initially occurred post operatively and was transient. Noted to be in Aflutter at office visit in July and again at recent hospitalization. Rate controlled today. Plan for DCCV in 3 weeks after a full month of anticoagulation. He has not missed any doses of Eliquis. Discussed with Dr. Shirlee Latch, will hold off on increasing Coreg with V rate of 59 bpm.   3. RV failure: Echo in  6/17 showed RV mildly dilated with normal systolic function.   - Off Revatio  4. Chronic systolic CHF: Ischemic cardiomyopathy.  Echo in 3/17 with EF 30-35%, improved to 40-45% on echo in 6/17. Echo 02/2017 EF 40-45%.  - NYHA II - Continue Coreg 3.125 mg BID - Continue Hydralazine 25 mg TID - Continue Imdur 30 mg daily - Continue torsemide 40 mg dialy - He is off Spiro and ARB with CKD. Can try to add back gently in the future after restoring NSR.   5. VT: - no further. Off Amiodarone.   6. CKD stage III: - Creatinine 2.52 at discharge. Will check BMET today.   7.  Carotid stenosis: - s/p R CEA in 2017 - Continue Statin  8. Hyperlipidemia:  - Continue Crestor.   9. PAD: Last visit with Dr. Arbie Cookey characterized his PAD as mild. No claudication symptoms today.   DCCV in 3 weeks. Follow up with Dr. Shirlee Latch in one month.    Little Ishikawa NP-C  04/11/2017

## 2017-05-04 NOTE — Anesthesia Preprocedure Evaluation (Signed)
Anesthesia Evaluation  Patient identified by MRN, date of birth, ID band Patient awake    Reviewed: Allergy & Precautions, NPO status , Patient's Chart, lab work & pertinent test results  History of Anesthesia Complications Negative for: history of anesthetic complications  Airway Mallampati: II   Neck ROM: Full    Dental  (+) Edentulous Upper, Partial Lower, Dental Advisory Given   Pulmonary COPD,  COPD inhaler, former smoker,    breath sounds clear to auscultation       Cardiovascular hypertension, Pt. on medications + CAD, + Past MI (recent MI), + Peripheral Vascular Disease (80% RIC stenosis) and +CHF  + dysrhythmias (complete heart block with recent MI)  Rhythm:Regular Rate:Normal  10/21/15 ECHO: EF 40-45%, infero-septal akinesis, anteroseptal hypokinesis, mild MR  Echo 7/18 - Left ventricle: The cavity size was normal. There was moderate focal basal hypertrophy. Systolic function was mildly to moderately reduced. The estimated ejection fraction was in the range of 40% to 45%. Diffuse hypokinesis. There is akinesis of the apical myocardium. Features are consistent with a pseudonormal left ventricular filling pattern, with concomitant abnormal relaxation and increased filling pressure (grade 2  diastolic dysfunction). - Aorta: Aortic root dimension: 40 mm (ED). - Aortic root: The aortic root was mildly dilated. - Right ventricle: The cavity size was mildly dilated. Wall   thickness was normal.   Neuro/Psych TIA   GI/Hepatic negative GI ROS, Elevated LFTs: h/o ETOH abuse   Endo/Other  Morbid obesity  Renal/GU negative Renal ROS     Musculoskeletal   Abdominal (+) + obese,   Peds  Hematology  (+) Blood dyscrasia (Hb 10.8), ,   Anesthesia Other Findings   Reproductive/Obstetrics                             Anesthesia Physical  Anesthesia Plan  ASA: III  Anesthesia Plan: General    Post-op Pain Management:    Induction: Intravenous  PONV Risk Score and Plan: 2 and Treatment may vary due to age or medical condition  Airway Management Planned: Mask  Additional Equipment:   Intra-op Plan:   Post-operative Plan:   Informed Consent: I have reviewed the patients History and Physical, chart, labs and discussed the procedure including the risks, benefits and alternatives for the proposed anesthesia with the patient or authorized representative who has indicated his/her understanding and acceptance.   Dental advisory given  Plan Discussed with: CRNA  Anesthesia Plan Comments:         Anesthesia Quick Evaluation

## 2017-05-04 NOTE — Discharge Instructions (Signed)
Electrical Cardioversion, Care After °This sheet gives you information about how to care for yourself after your procedure. Your health care provider may also give you more specific instructions. If you have problems or questions, contact your health care provider. °What can I expect after the procedure? °After the procedure, it is common to have: °· Some redness on the skin where the shocks were given. ° °Follow these instructions at home: °· Do not drive for 24 hours if you were given a medicine to help you relax (sedative). °· Take over-the-counter and prescription medicines only as told by your health care provider. °· Ask your health care provider how to check your pulse. Check it often. °· Rest for 48 hours after the procedure or as told by your health care provider. °· Avoid or limit your caffeine use as told by your health care provider. °Contact a health care provider if: °· You feel like your heart is beating too quickly or your pulse is not regular. °· You have a serious muscle cramp that does not go away. °Get help right away if: °· You have discomfort in your chest. °· You are dizzy or you feel faint. °· You have trouble breathing or you are short of breath. °· Your speech is slurred. °· You have trouble moving an arm or leg on one side of your body. °· Your fingers or toes turn cold or blue. °This information is not intended to replace advice given to you by your health care provider. Make sure you discuss any questions you have with your health care provider. °Document Released: 05/15/2013 Document Revised: 02/26/2016 Document Reviewed: 01/29/2016 °Elsevier Interactive Patient Education © 2018 [employer] ° °

## 2017-05-04 NOTE — Interval H&P Note (Signed)
History and Physical Interval Note:  05/04/2017 12:25 PM  Christopher [name]  has presented today for surgery, with the diagnosis of AFIB  The various methods of treatment have been discussed with the patient and family. After consideration of risks, benefits and other options for treatment, the patient has consented to  Procedure(s): CARDIOVERSION (N/A) as a surgical intervention .  The patient's history has been reviewed, patient examined, no change in status, stable for surgery.  I have reviewed the patient's chart and labs.  Questions were answered to the patient's satisfaction.     Dalton [employer]

## 2017-05-10 NOTE — Addendum Note (Signed)
Addended by: Burton Apley A on: 05/10/2017 01:00 PM   Modules accepted: Orders

## 2017-05-19 ENCOUNTER — Other Ambulatory Visit (HOSPITAL_COMMUNITY): Payer: Self-pay | Admitting: *Deleted

## 2017-05-19 ENCOUNTER — Ambulatory Visit (HOSPITAL_COMMUNITY)
Admission: RE | Admit: 2017-05-19 | Discharge: 2017-05-19 | Disposition: A | Discharge status: Home / Self Care | Payer: Medicaid Other | Source: Ambulatory Visit | Attending: Cardiology | Admitting: Cardiology

## 2017-05-19 VITALS — BP 158/98 | HR 74 | Wt 254.8 lb

## 2017-05-19 DIAGNOSIS — N183 Chronic kidney disease, stage 3 (moderate): Secondary | ICD-10-CM | POA: Insufficient documentation

## 2017-05-19 DIAGNOSIS — I5022 Chronic systolic (congestive) heart failure: Secondary | ICD-10-CM

## 2017-05-19 DIAGNOSIS — Z803 Family history of malignant neoplasm of breast: Secondary | ICD-10-CM | POA: Insufficient documentation

## 2017-05-19 DIAGNOSIS — Z79899 Other long term (current) drug therapy: Secondary | ICD-10-CM | POA: Insufficient documentation

## 2017-05-19 DIAGNOSIS — E785 Hyperlipidemia, unspecified: Secondary | ICD-10-CM | POA: Insufficient documentation

## 2017-05-19 DIAGNOSIS — Z7901 Long term (current) use of anticoagulants: Secondary | ICD-10-CM | POA: Insufficient documentation

## 2017-05-19 DIAGNOSIS — I2510 Atherosclerotic heart disease of native coronary artery without angina pectoris: Secondary | ICD-10-CM | POA: Insufficient documentation

## 2017-05-19 DIAGNOSIS — I739 Peripheral vascular disease, unspecified: Secondary | ICD-10-CM | POA: Diagnosis not present

## 2017-05-19 DIAGNOSIS — I255 Ischemic cardiomyopathy: Secondary | ICD-10-CM | POA: Diagnosis not present

## 2017-05-19 DIAGNOSIS — I4892 Unspecified atrial flutter: Secondary | ICD-10-CM | POA: Diagnosis not present

## 2017-05-19 DIAGNOSIS — Z951 Presence of aortocoronary bypass graft: Secondary | ICD-10-CM | POA: Insufficient documentation

## 2017-05-19 DIAGNOSIS — Z8249 Family history of ischemic heart disease and other diseases of the circulatory system: Secondary | ICD-10-CM | POA: Diagnosis not present

## 2017-05-19 DIAGNOSIS — G629 Polyneuropathy, unspecified: Secondary | ICD-10-CM | POA: Diagnosis not present

## 2017-05-19 DIAGNOSIS — I252 Old myocardial infarction: Secondary | ICD-10-CM | POA: Insufficient documentation

## 2017-05-19 MED ORDER — CARVEDILOL 6.25 MG PO TABS: 6.2500 mg | ORAL_TABLET | Freq: Two times a day (BID) | ORAL | 3 refills | Status: DC

## 2017-05-19 NOTE — Patient Instructions (Signed)
INCREASE Carvedilol 6.25 mg (1 Tablet) Twice Daily  Follow up in 3 Months, please call us in December to schedule your follow up.  [phone] Opt 3

## 2017-05-21 NOTE — Progress Notes (Signed)
Patient ID: Christopher [name], male   DOB: August 21, 1960, 56 y.o.   MRN: [id number]   PCP: Dr. Hyman Hopes Cardiology: Dr. Shirlee Latch Vascular: Dr Early  56 yo with history of CAD s/p CABG, ischemic cardiomyopathy, and CKD presents for cardiology followup.  In 3/17, he came to the hospital with a late presentation inferior MI.  He was noted to have occluded RCA with collaterals and 95% proximal LAD.  He had CABG x 4 + right CEA.  He had a balloon pump with cardiogenic shock.  He additionally had multiple episodes of post-op VT/VF.  Amiodarone was started.  Repeat cath showed slow flow in the SVG-D and SVG-ramus given diffusely diseased and small distal targets.  He developed AKI.  He had prolonged mechanical ventilation and a tracheostomy.  He had RV failure that was treated with Revatio.  He had transient atrial flutter that resolved quickly.  Last echo in 3/17 showed EF 30-35%.  Finally, the tracheostomy was removed.  He went to inpatient rehab for about a week and is now home again.  Echo 6/17 with EF 40-45%.   Admitted 8/21-8/27/18 with acute respiratory failure due to HCAP and HTN emergency. Also had rapid atrial flutter. Rate improved and DCCV was planned for outpatient as he had missed a few doses of Eliquis at home. Discharge weight was 270 pounds. He had DCCV to NSR in 9/18 and remains in NSR today.   He returns today for followup of CHF and atrial flutter. Weight is down 12 lbs.  No palpitations.  He has been exercising and dieting.  No dyspnea walking on flat ground, he is able to walk 1.5 miles without problem.  No chest pain.  Claudication seems to have improved and is minimal now.    ECG (personally reviewed): NSR, LAFB, old inferior MI, old anterolateral MI  Labs (4/17): K 3.4, creatinine 1.6 Labs (5/17): K 4.4, creatinine 1.65, LFTs normal, TSH mildly elevated, free T3 and free T4 normal, BNP 502, hemoglobin 12.3 Labs (6/17): BNP 737, K 4.4, creatinine 1.72 Labs (7/17): LDL 136, K 4.9, creatinine  1.79 Labs (8/17): BNP 239, K 3.8, creatinine 2.17 Labs (10/17): LDL 74, HDL 34, LFTs normal Labs (6/96): K 5, creatinine 1.67, BNP 774 Labs (6/18): K 3.3, creatinine 1.82 Labs 02/06/2017: K 4.3 Creatinine 1.78.  Labs (9/18): K 4.1, creatinine 1.73  PMH: 1. CAD: Late presentation inferior MI in 3/17.  - LHC showed totally occluded pRCA with L=>R collaterals, 95% pLAD, 70% ramus.   - CABG with LIMA-LAD, SVG-D, SVG-ramus, SVG-RCA.  - Repeat echo in 3/17 after CABG showed patent LIMA and SVG-RCA but SVG-D and SVG- ramus had slow flow with small, diffusely diseased distal targets.  - Echo (6/17) with EF 40-45%, inferior severe hypokinesis, mildly dilated RV with normal systolic function, PASP 30 mmHg.  2. Chronic systolic CHF: Ischemic cardiomyopathy.   - Echo (3/17) with EF 30-35%, septal and inferior AK.   - Echo (7/18): EF 40-45%, moderate FBSH, grade II diastolic dysfunction, RV mildly dilated with normal systolic function.  3. RV failure: was on Revatio, now off.  4. CKD: Stage 3.  5. Atrial flutter: Transient, post-op.   - DCCV to NSR in 9/18.  6. VT: Post-op CABG.  7. Carotid stenosis: Right CEA 3/17.  - Carotid dopplers (9/18): 1-39% LICA, s/p R CEA.  8. PAD: Peripheral artery dopplers (7/17) with > 50% L CIA stenosis, occluded distal external iliac, occluded right SFA, occluded left SFA.  9. 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   Family History  Problem Relation Age of Onset  . Heart disease Mother   . Failure to thrive Father   . Congestive Heart Failure Father        Deceased  . Breast cancer Sister        Living   ROS: All systems reviewed and negative except as per HPI.   Current Outpatient Prescriptions  Medication Sig Dispense Refill  . apixaban (ELIQUIS) 5 MG TABS tablet Take 1 tablet (5 mg total) by mouth 2 (two) times daily. 60 tablet 6  . DULoxetine (CYMBALTA) 30 MG capsule Take 30 mg by mouth  daily.    Marland Kitchen gabapentin (NEURONTIN) 300 MG capsule Take 1 capsule (300 mg total) by mouth 2 (two) times daily. 60 capsule 1  . hydrALAZINE (APRESOLINE) 25 MG tablet Take 1 tablet (25 mg total) by mouth every 8 (eight) hours. 90 tablet 3  . isosorbide mononitrate (IMDUR) 30 MG 24 hr tablet Take 1 tablet (30 mg total) by mouth daily. 30 tablet 3  . potassium chloride SA (K-DUR,KLOR-CON) 20 MEQ tablet Take 2 tablets (40 mEq total) by mouth daily. 30 tablet 0  . QUEtiapine (SEROQUEL) 25 MG tablet Take 12.5-25 mg by mouth at bedtime.     . ranitidine (ZANTAC) 150 MG tablet Take 150 mg by mouth 2 (two) times daily.    . rosuvastatin (CRESTOR) 40 MG tablet TAKE 1 TABLET BY MOUTH ONCE DAILY 30 tablet 1  . senna-docusate (SENOKOT-S) 8.6-50 MG tablet Take 1 tablet by mouth at bedtime. For constipation 30 tablet 0  . torsemide (DEMADEX) 20 MG tablet Take 2 tablets (40 mg total) by mouth daily. Start on 03/26/17 60 tablet 3  . carvedilol (COREG) 6.25 MG tablet Take 1 tablet (6.25 mg total) by mouth 2 (two) times daily. 60 tablet 3   No current facility-administered medications for this encounter.    BP (!) 158/98 (BP Location: Right Arm, Patient Position: Sitting, Cuff Size: Normal)   Pulse 74   Wt 254 lb 12.8 oz (115.6 kg)   SpO2 96%   BMI 36.04 kg/m   Filed Weights   05/19/17 1513  Weight: 254 lb 12.8 oz (115.6 kg)   General: NAD Neck: Thick, no JVD, no thyromegaly or thyroid nodule.  Lungs: Clear to auscultation bilaterally with normal respiratory effort. CV: Nondisplaced PMI.  Heart regular S1/S2, no S3/S4, no murmur.  No peripheral edema.  No carotid bruit.  Normal pedal pulses.  Abdomen: Soft, nontender, no hepatosplenomegaly, no distention.  Skin: Intact without lesions or rashes.  Neurologic: Alert and oriented x 3.  Psych: Normal affect. Extremities: No clubbing or cyanosis.  HEENT: Normal.   Assessment/Plan: 1. CAD: s/p CABG.  Denies chest pain.  - Continue Crestor.  - No ASA with  Eliquis.  2. Atrial flutter: Initially occurred post operatively and was transient. Noted to be in Aflutter at office visit in 7/18 and again at recent hospitalization in 8/18. He has had DCCV in 9/18 and is back in NSR.  He saw Dr. Johney Frame but wants to hold off on flutter ablation for now.  - Continue Eliquis.  - If flutter recurs, he agrees to ablation.  3. Chronic systolic CHF: Ischemic cardiomyopathy.  Echo in 3/17 with EF 30-35%, improved to 40-45% on echo in 6/17.  Echo 02/2017 EF 40-45%.  NYHA II, not volume overloaded on exam.  - Increase Coreg to 6.25 mg bid.  - Continue  Hydralazine 25 mg TID and Imdur 30 daily.  - Continue torsemide 40 mg dialy - He is off Spiro and ARB with CKD. Can try to add back gently in the future.  - EF above ICD range.  5. VT: No further. Off Amiodarone.  6. CKD stage III: Creatinine lower when last checked in 9/18 at 1.73.  7. Carotid stenosis: s/p R CEA in 2017 8. Hyperlipidemia:  - Continue Crestor.  9. PAD: Minimal claudication currently.   Followup in 3 months.   [name] MD 05/21/2017

## 2017-05-22 ENCOUNTER — Other Ambulatory Visit (HOSPITAL_COMMUNITY): Payer: Self-pay | Admitting: Internal Medicine

## 2017-05-22 DIAGNOSIS — I509 Heart failure, unspecified: Secondary | ICD-10-CM

## 2017-06-08 MED FILL — POTASSIUM CL ER 20 MEQ TABL: 20 | 30 days supply | Qty: 60 | Fill #2

## 2017-07-07 ENCOUNTER — Other Ambulatory Visit (HOSPITAL_COMMUNITY): Payer: Self-pay

## 2017-07-07 MED ORDER — POTASSIUM CHLORIDE CRYS ER 20 MEQ PO TBCR: 40.0000 meq | EXTENDED_RELEASE_TABLET | Freq: Every day | ORAL | 0 refills | Status: DC

## 2017-07-28 ENCOUNTER — Other Ambulatory Visit (HOSPITAL_COMMUNITY): Payer: Self-pay | Admitting: Cardiology

## 2017-08-04 IMAGING — DX DG CHEST 1V PORT
1 series · 1 of 1 positions shown · non-contrast
Comparison: None.

CLINICAL DATA: Shortness of breath.

EXAM:
PORTABLE CHEST 1 VIEW

[chest ap]
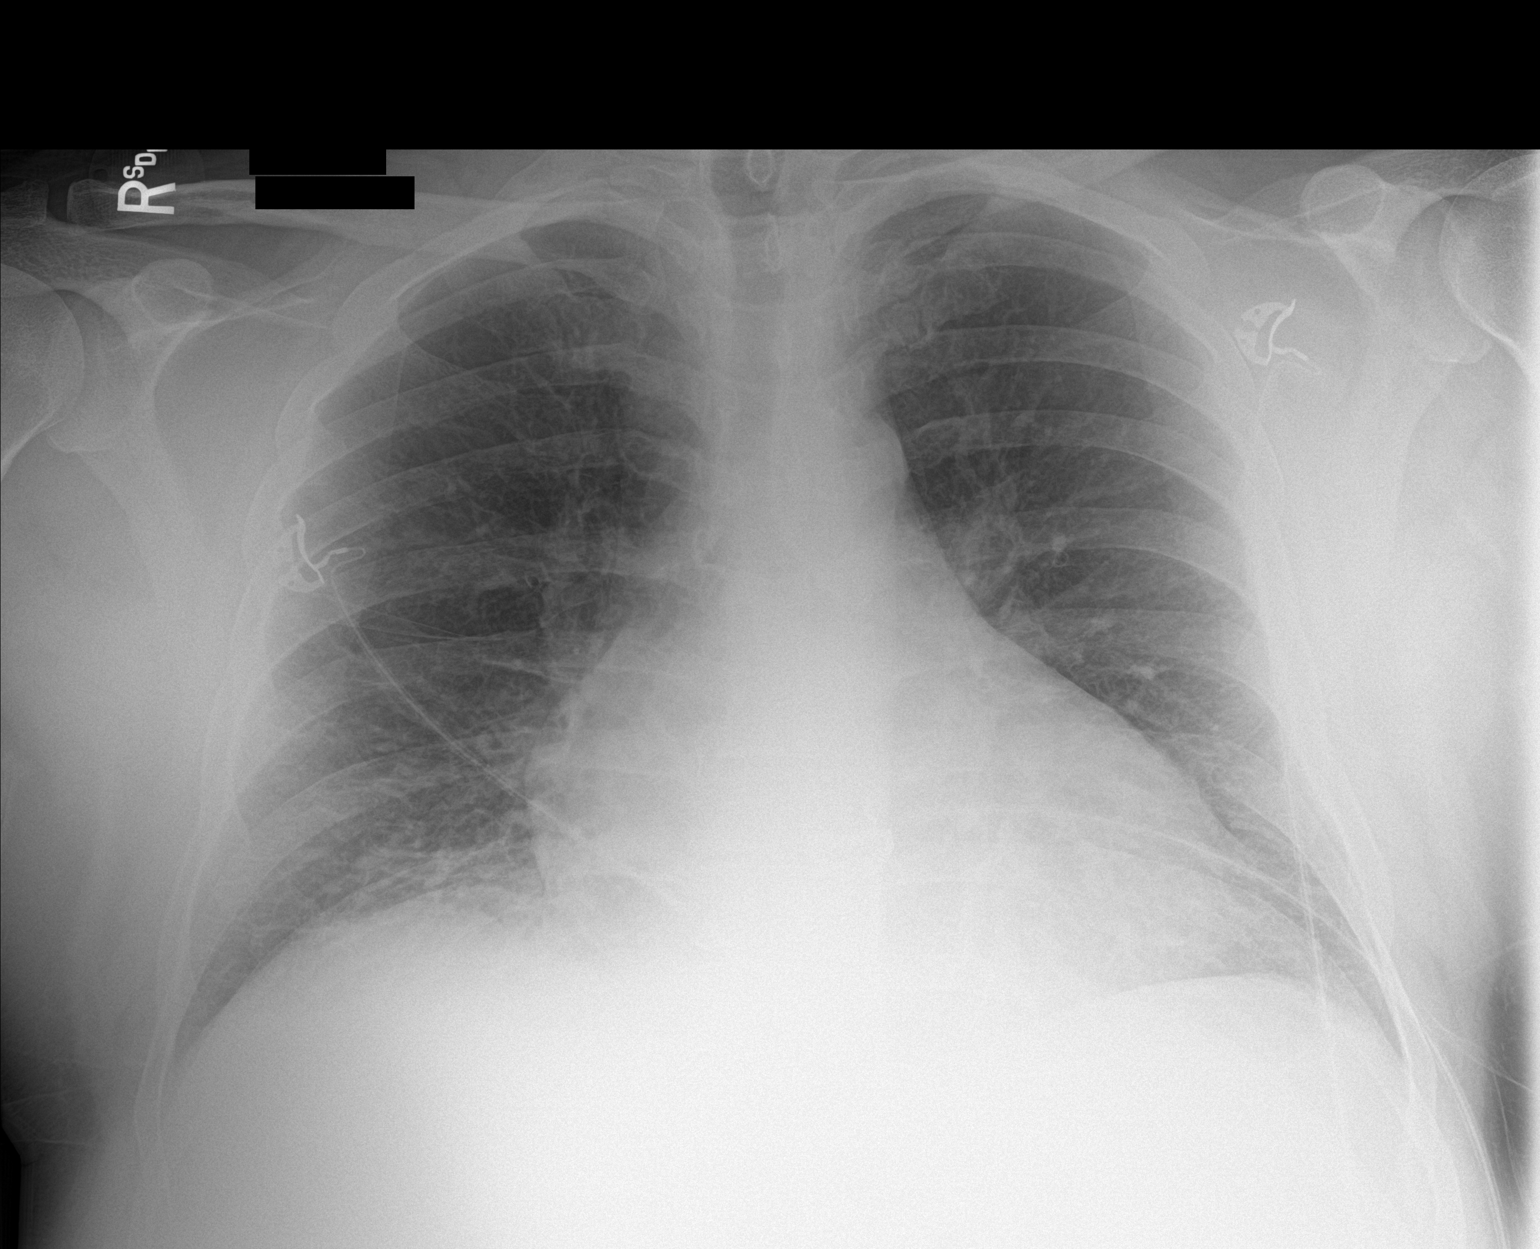

[1 of 1 positions shown; findings below may reference images not displayed]

FINDINGS: Midline trachea. Cardiomegaly accentuated by AP portable technique.
No pleural effusion or pneumothorax. Mildly low lung volumes with
bibasilar atelectasis.
IMPRESSION: Mild cardiomegaly and low lung volumes.  No acute findings.

## 2017-08-11 ENCOUNTER — Other Ambulatory Visit (HOSPITAL_COMMUNITY): Payer: Self-pay | Admitting: Cardiology

## 2017-08-11 IMAGING — CR DG CHEST 1V PORT
1 series · 1 of 1 positions shown · non-contrast
Comparison: 10/23/2015

CLINICAL DATA: Status post coronary bypass grafting

EXAM:
PORTABLE CHEST 1 VIEW

[AP]
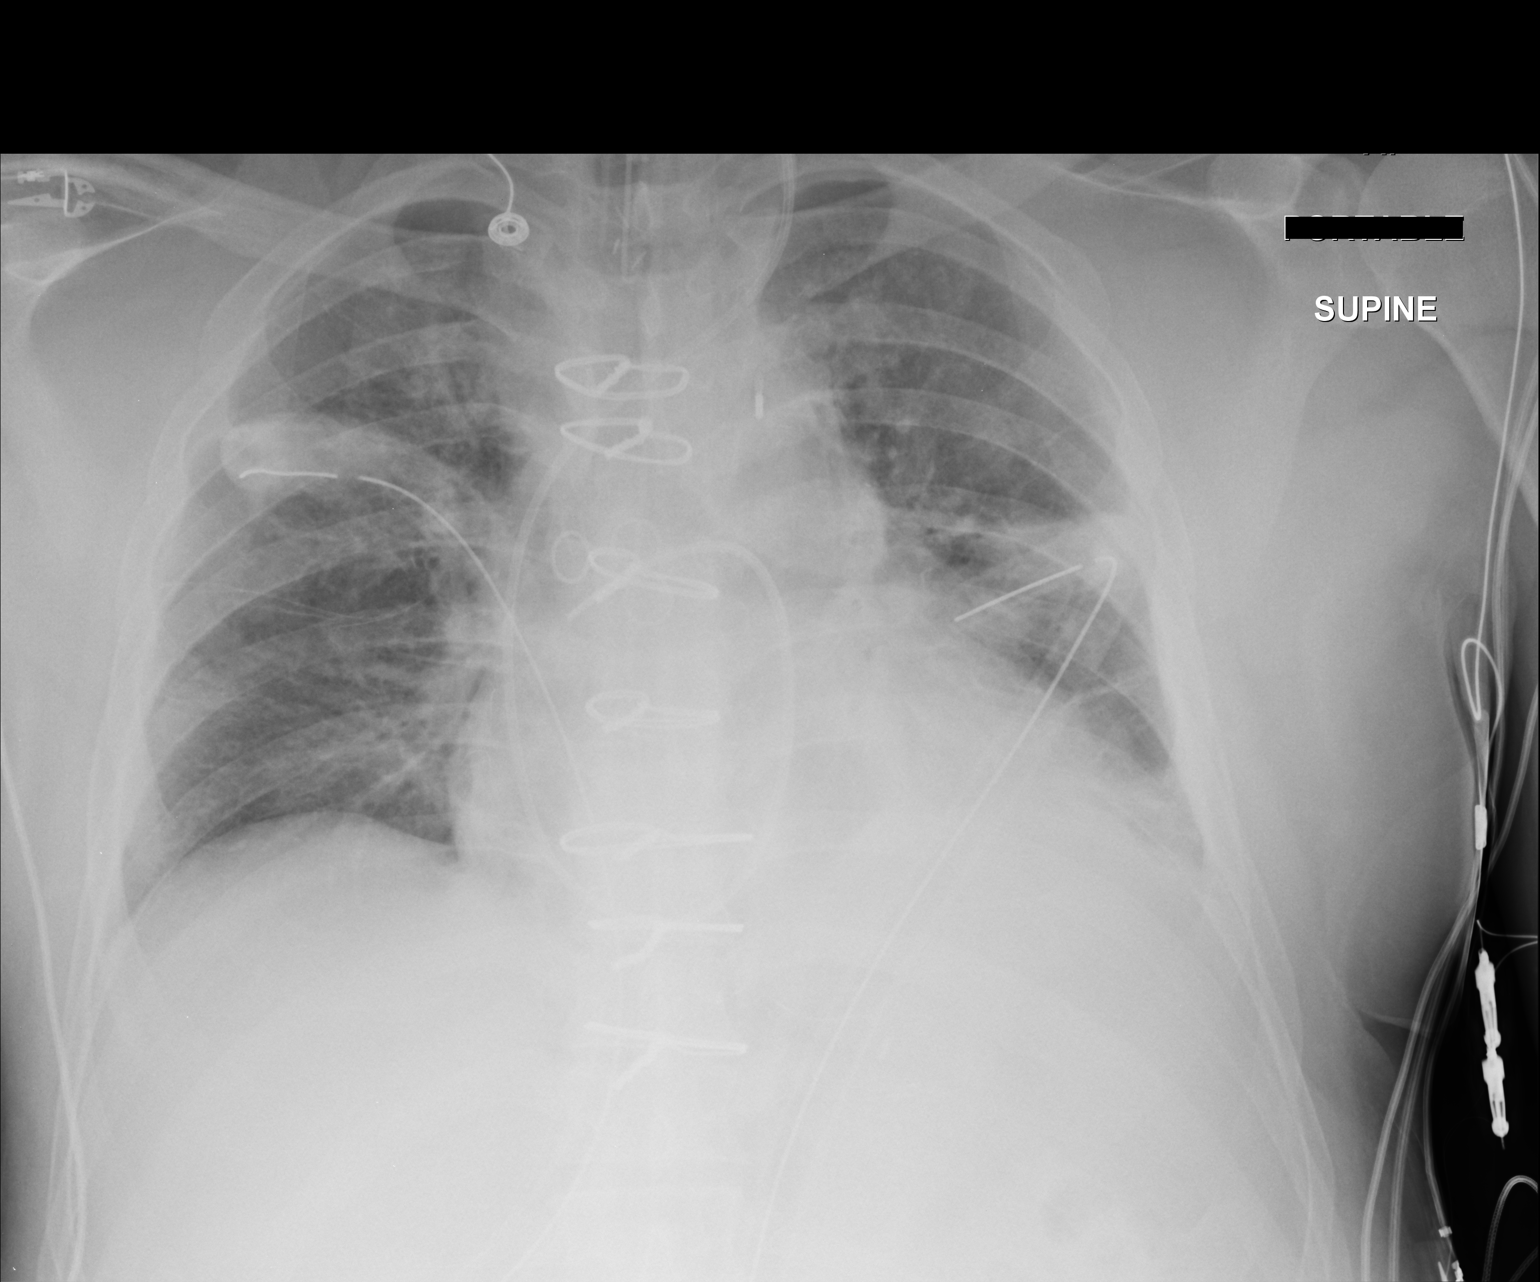

[1 of 1 positions shown; findings below may reference images not displayed]

FINDINGS: Cardiac shadow is mildly enlarged. A Swan-Ganz catheter is noted in
the right pulmonary outflow tract. Bilateral thoracostomy catheters
are seen. No pneumothorax is noted. An endotracheal tube is noted in
satisfactory position. Intra-aortic balloon pump is noted over the
aortic arch.

Mild left basilar atelectasis is seen. Mild central vascular
congestion is noted.
IMPRESSION: Postoperative changes with tubes and lines as described above.

Mild vascular congestion and left basilar atelectasis.

## 2017-08-12 IMAGING — CR DG CHEST 1V PORT
1 series · 1 of 1 positions shown · non-contrast
Comparison: None.

CLINICAL DATA: Post CABG, on ventilator, followup portable chest
x-ray of 10/28/2015

EXAM:
PORTABLE CHEST 1 VIEW

[AP]
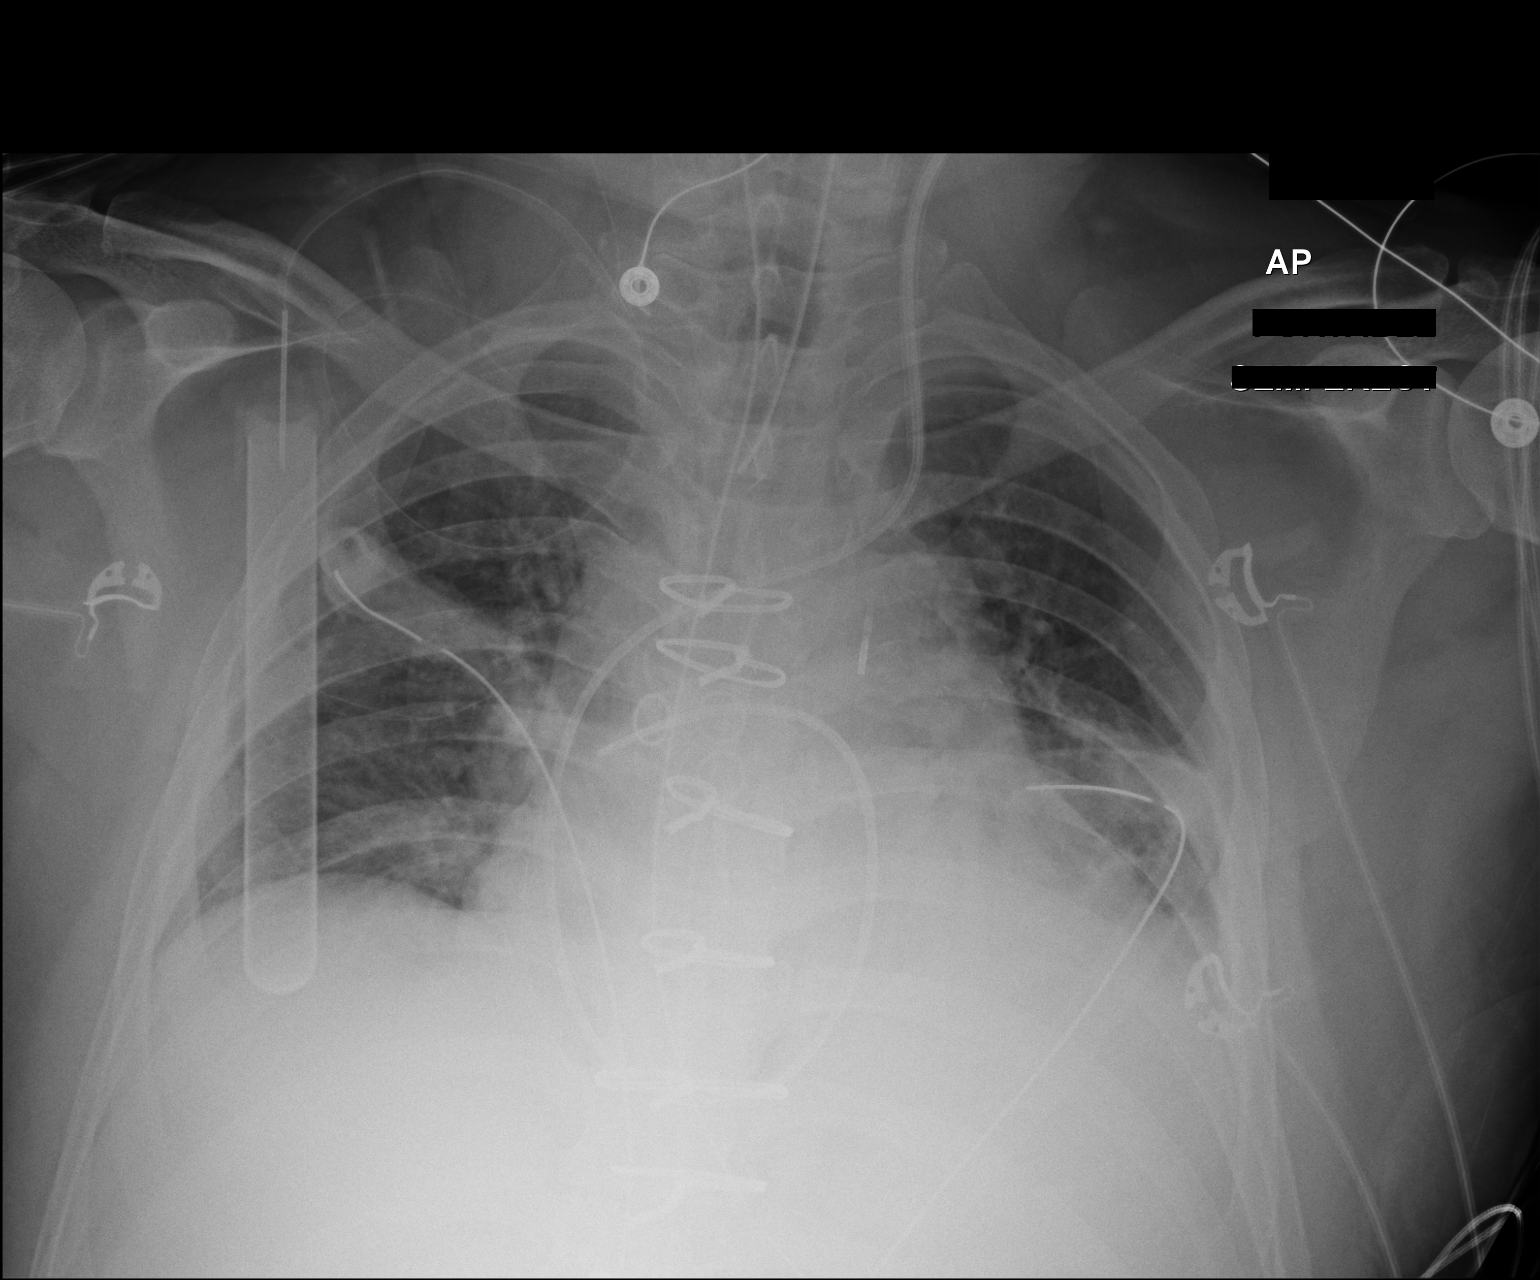

[1 of 1 positions shown; findings below may reference images not displayed]

FINDINGS: The tip of the endotracheal tube is approximately 5.2 cm above the
carina. Aeration has improved slightly. Atelectasis remains at the
lung bases left-greater-than-right. Swan-Ganz catheter tip is in the
right lower lobe pulmonary artery and bilateral chest tubes remain.
No pneumothorax is seen. Heart size is stable.
IMPRESSION: 1. Improved aeration.
2. Bilateral chest tubes.  No pneumothorax.
3. Tip of endotracheal tube approximately 5.2 cm above the carina.

## 2017-08-13 IMAGING — CR DG CHEST 1V PORT
1 series · 1 of 1 positions shown · non-contrast
Comparison: Portable chest x-ray October 28, 2015

CLINICAL DATA: Postop CABG 2 days ago.

EXAM:
PORTABLE CHEST 1 VIEW

[AP]
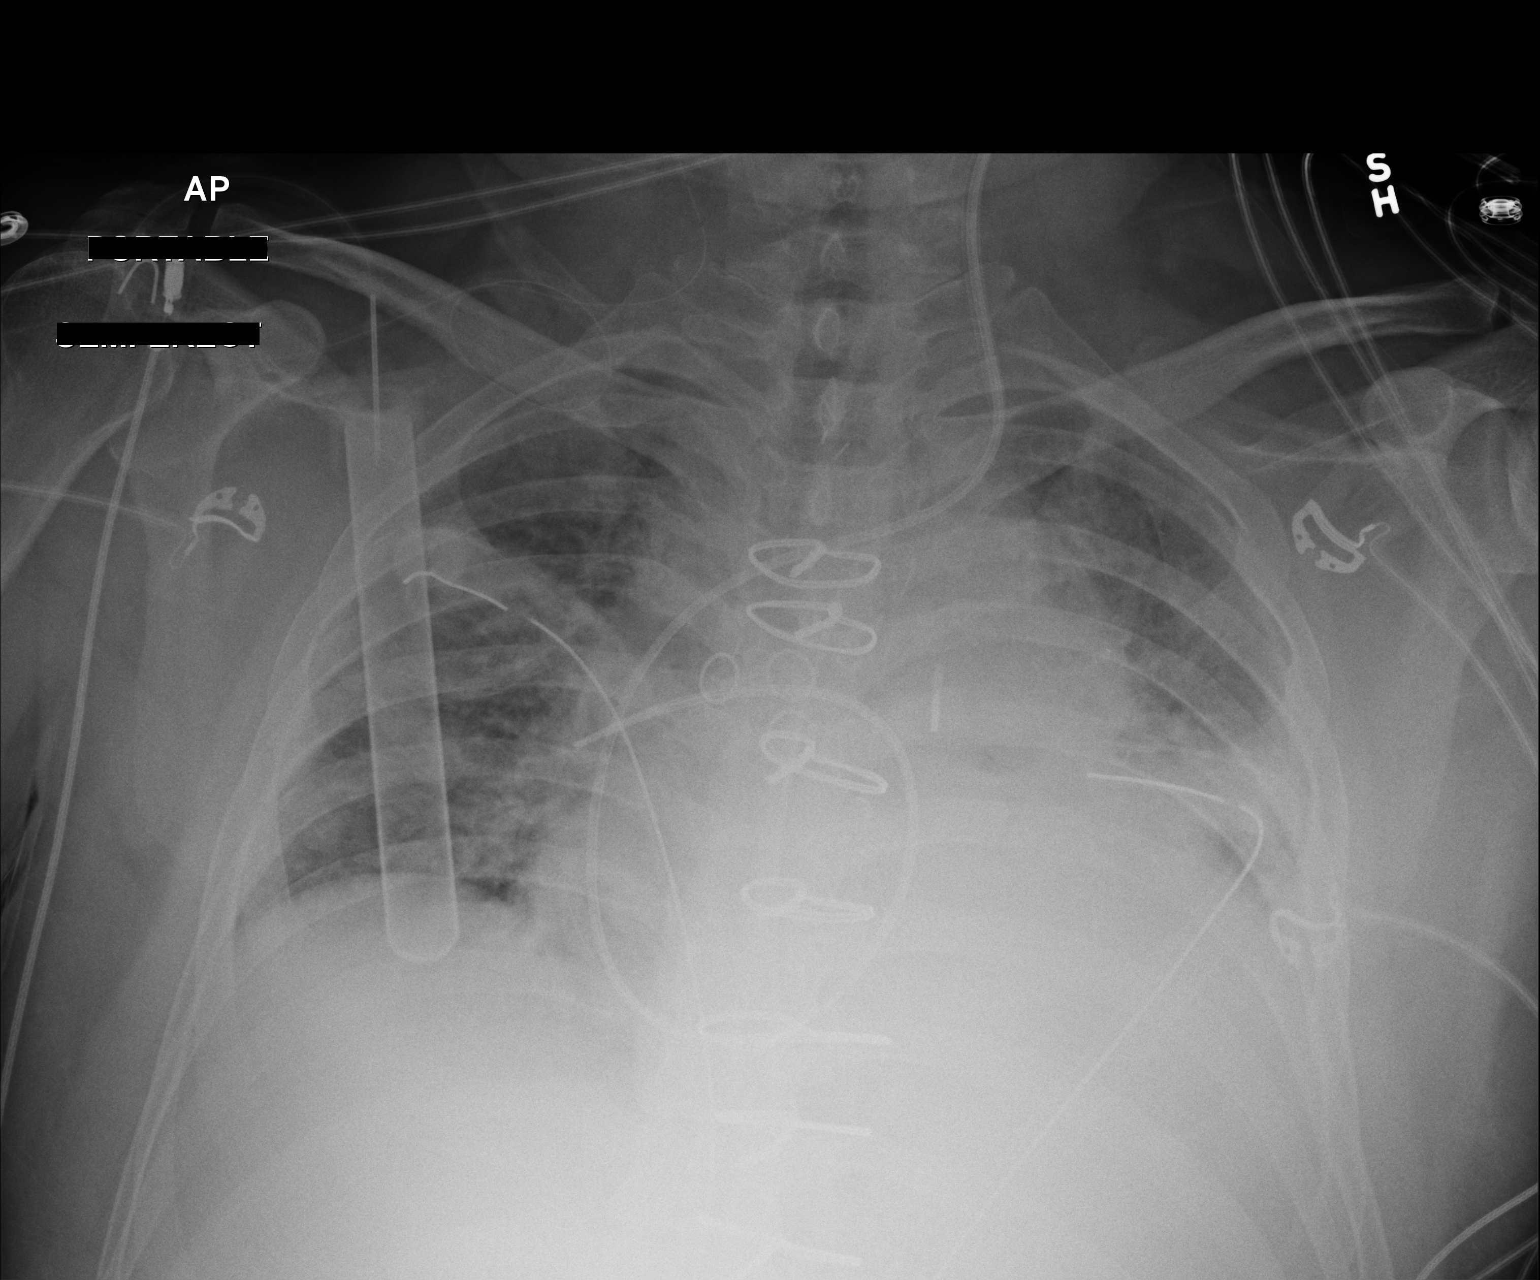

[1 of 1 positions shown; findings below may reference images not displayed]

FINDINGS: The trachea and esophagus have been extubated. The cardiac
silhouette remains enlarged. The pulmonary vascularity is mildly
engorged. The bilateral chest tubes and the mediastinal drain are in
stable position. There is no pneumothorax or large pleural effusion.
The Swan-Ganz catheter tip overlies a proximal right lower lobe
pulmonary artery branch. The mediastinum is widened and accentuated
by the hypo inflation.
IMPRESSION: Bilateral hypoinflation following extubation of the trachea there is
crowding of the pulmonary vascularity. Left basilar atelectasis or
less likely pneumonia is more prominent today. Probable small left
pleural effusion. The remaining support tubes and lines are in
stable position.

## 2017-08-14 IMAGING — CR DG CHEST 1V PORT
1 series · 1 of 1 positions shown · non-contrast
Comparison: Portable film earlier today.

CLINICAL DATA: Post cavity.  Recent CABG.

EXAM:
PORTABLE CHEST 1 VIEW

[AP]
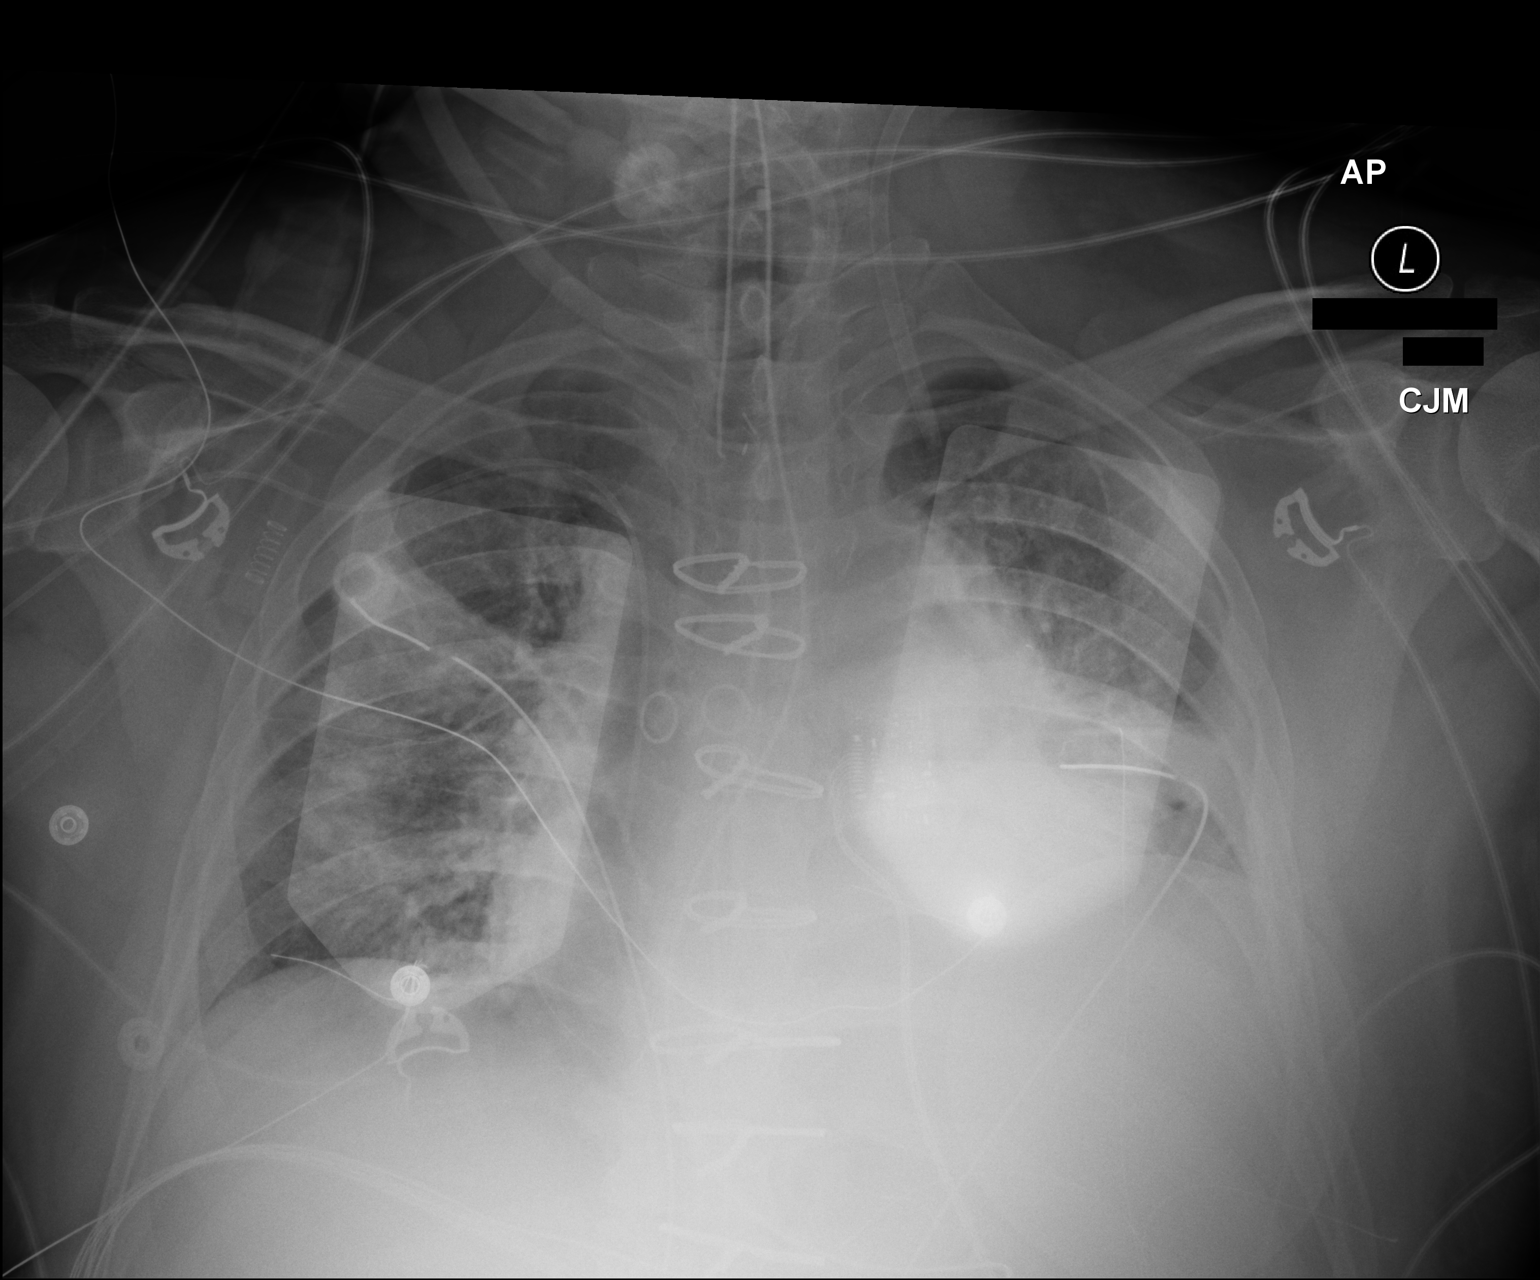

[1 of 1 positions shown; findings below may reference images not displayed]

FINDINGS: Overlying support apparatus redemonstrated. Tubes and lines remain
stable. Elevated LEFT hemidiaphragm. No definite pneumothorax.
Moderate vascular congestion may be increased. Cardiomegaly.
IMPRESSION: Slight worsening aeration. Moderate vascular congestion may be
increased.

## 2017-08-22 ENCOUNTER — Telehealth (HOSPITAL_COMMUNITY): Payer: Self-pay | Admitting: *Deleted

## 2017-08-22 NOTE — Telephone Encounter (Signed)
Advanced Heart Failure Triage Encounter  Patient Name: Christopher [name]  Date of Call: 08/22/17  Problem: productive cough/running nose  Patient wife called saying he is having a productive cough since Sunday along with a runny nose.  Asked what he can take over the counter.  Plan:  Advised patient can take plain mussinex but if it doesn't get he should call his PCP. She understands and no further questions.   Georgina Peer, RN

## 2017-08-24 ENCOUNTER — Ambulatory Visit (HOSPITAL_COMMUNITY)
Admission: RE | Admit: 2017-08-24 | Discharge: 2017-08-24 | Disposition: A | Discharge status: Home / Self Care | Payer: Medicaid Other | Source: Ambulatory Visit | Attending: Cardiology | Admitting: Cardiology

## 2017-08-24 ENCOUNTER — Encounter (HOSPITAL_COMMUNITY): Payer: Self-pay | Admitting: Cardiology

## 2017-08-24 ENCOUNTER — Encounter (HOSPITAL_COMMUNITY): Payer: Medicaid Other | Admitting: Cardiology

## 2017-08-24 VITALS — BP 149/92 | HR 79 | Wt 221.8 lb

## 2017-08-24 DIAGNOSIS — N179 Acute kidney failure, unspecified: Secondary | ICD-10-CM | POA: Insufficient documentation

## 2017-08-24 DIAGNOSIS — I5022 Chronic systolic (congestive) heart failure: Secondary | ICD-10-CM | POA: Insufficient documentation

## 2017-08-24 DIAGNOSIS — Z8546 Personal history of malignant neoplasm of prostate: Secondary | ICD-10-CM | POA: Diagnosis not present

## 2017-08-24 DIAGNOSIS — G629 Polyneuropathy, unspecified: Secondary | ICD-10-CM | POA: Insufficient documentation

## 2017-08-24 DIAGNOSIS — I255 Ischemic cardiomyopathy: Secondary | ICD-10-CM | POA: Insufficient documentation

## 2017-08-24 DIAGNOSIS — Z79899 Other long term (current) drug therapy: Secondary | ICD-10-CM | POA: Insufficient documentation

## 2017-08-24 DIAGNOSIS — I4892 Unspecified atrial flutter: Secondary | ICD-10-CM | POA: Insufficient documentation

## 2017-08-24 DIAGNOSIS — I779 Disorder of arteries and arterioles, unspecified: Secondary | ICD-10-CM

## 2017-08-24 DIAGNOSIS — I6523 Occlusion and stenosis of bilateral carotid arteries: Secondary | ICD-10-CM | POA: Insufficient documentation

## 2017-08-24 DIAGNOSIS — E669 Obesity, unspecified: Secondary | ICD-10-CM | POA: Insufficient documentation

## 2017-08-24 DIAGNOSIS — E785 Hyperlipidemia, unspecified: Secondary | ICD-10-CM | POA: Insufficient documentation

## 2017-08-24 DIAGNOSIS — Z803 Family history of malignant neoplasm of breast: Secondary | ICD-10-CM | POA: Insufficient documentation

## 2017-08-24 DIAGNOSIS — N183 Chronic kidney disease, stage 3 unspecified: Secondary | ICD-10-CM

## 2017-08-24 DIAGNOSIS — Z8249 Family history of ischemic heart disease and other diseases of the circulatory system: Secondary | ICD-10-CM | POA: Diagnosis not present

## 2017-08-24 DIAGNOSIS — I2510 Atherosclerotic heart disease of native coronary artery without angina pectoris: Secondary | ICD-10-CM | POA: Diagnosis not present

## 2017-08-24 DIAGNOSIS — J410 Simple chronic bronchitis: Secondary | ICD-10-CM

## 2017-08-24 DIAGNOSIS — Z6831 Body mass index (BMI) 31.0-31.9, adult: Secondary | ICD-10-CM | POA: Insufficient documentation

## 2017-08-24 DIAGNOSIS — Z7902 Long term (current) use of antithrombotics/antiplatelets: Secondary | ICD-10-CM | POA: Insufficient documentation

## 2017-08-24 DIAGNOSIS — Z951 Presence of aortocoronary bypass graft: Secondary | ICD-10-CM | POA: Insufficient documentation

## 2017-08-24 DIAGNOSIS — I739 Peripheral vascular disease, unspecified: Secondary | ICD-10-CM

## 2017-08-24 DIAGNOSIS — I252 Old myocardial infarction: Secondary | ICD-10-CM | POA: Insufficient documentation

## 2017-08-24 DIAGNOSIS — Z9889 Other specified postprocedural states: Secondary | ICD-10-CM | POA: Diagnosis not present

## 2017-08-24 LAB — CBC
HEMATOCRIT: 46.9 % (ref 39.0–52.0)
HEMOGLOBIN: 16.1 g/dL (ref 13.0–17.0)
MCH: 29.7 pg (ref 26.0–34.0)
MCHC: 34.3 g/dL (ref 30.0–36.0)
MCV: 86.5 fL (ref 78.0–100.0)
Platelets: 234 10*3/uL (ref 150–400)
RBC: 5.42 MIL/uL (ref 4.22–5.81)
RDW: 15.6 % — ABNORMAL HIGH (ref 11.5–15.5)
WBC: 13.1 10*3/uL — ABNORMAL HIGH (ref 4.0–10.5)

## 2017-08-24 LAB — LIPID PANEL
CHOL/HDL RATIO: 3.9 ratio
Cholesterol: 109 mg/dL (ref 0–200)
HDL: 28 mg/dL — AB (ref >40)
LDL Cholesterol: 48 mg/dL (ref 0–99)
TRIGLYCERIDES: 167 mg/dL — AB (ref <150)
VLDL: 33 mg/dL (ref 0–40)

## 2017-08-24 LAB — BASIC METABOLIC PANEL
ANION GAP: 16 — AB (ref 5–15)
BUN: 20 mg/dL (ref 6–20)
CHLORIDE: 96 mmol/L — AB (ref 101–111)
CO2: 24 mmol/L (ref 22–32)
Calcium: 9.7 mg/dL (ref 8.9–10.3)
Creatinine, Ser: 1.63 mg/dL — ABNORMAL HIGH (ref 0.61–1.24)
GFR calc non Af Amer: 46 mL/min — ABNORMAL LOW (ref >60)
GFR, EST AFRICAN AMERICAN: 53 mL/min — AB (ref >60)
Glucose, Bld: 103 mg/dL — ABNORMAL HIGH (ref 65–99)
POTASSIUM: 3.6 mmol/L (ref 3.5–5.1)
SODIUM: 136 mmol/L (ref 135–145)

## 2017-08-24 MED ORDER — SPIRONOLACTONE 25 MG PO TABS: 12.5000 mg | ORAL_TABLET | Freq: Every day | ORAL | 3 refills | Status: DC

## 2017-08-24 NOTE — Progress Notes (Signed)
Patient ID: Christopher [name], male   DOB: 05-Feb-1961, 57 y.o.   MRN: [id number]   PCP: Dr. Hyman Hopes Cardiology: Dr. Shirlee Latch Vascular: Dr Early  58 yo with history of CAD s/p CABG, ischemic cardiomyopathy, and CKD presents for cardiology followup.  In 3/17, he came to the hospital with a late presentation inferior MI.  He was noted to have occluded RCA with collaterals and 95% proximal LAD.  He had CABG x 4 + right CEA.  He had a balloon pump with cardiogenic shock.  He additionally had multiple episodes of post-op VT/VF.  Amiodarone was started.  Repeat cath showed slow flow in the SVG-D and SVG-ramus given diffusely diseased and small distal targets.  He developed AKI.  He had prolonged mechanical ventilation and a tracheostomy.  He had RV failure that was treated with Revatio.  He had transient atrial flutter that resolved quickly.  Last echo in 3/17 showed EF 30-35%.  Finally, the tracheostomy was removed.  He went to inpatient rehab for about a week and is now home again.  Echo 6/17 with EF 40-45%.   Admitted 8/21-8/27/18 with acute respiratory failure due to HCAP and HTN emergency. Also had rapid atrial flutter. Rate improved and DCCV was planned for outpatient as he had missed a few doses of Eliquis at home. Discharge weight was 270 pounds. He had DCCV to NSR in 9/18.  He presents today for regular follow up.  Last seen 05/2017. He is down another 34 lbs. Gave up bread and sugars. Eating mostly lean meats and salad. Denies SOB. Feels the best he has in a decade. Walking 5000 steps daily. Hurt his back last week over exerting. Denies any claudications symptoms. Denies chest pain. Denies lightheadedness or dizziness.  ECG: NSR, LAFB, old inferior MI, old anterolateral MI, personally reviewed  Labs (4/17): K 3.4, creatinine 1.6 Labs (5/17): K 4.4, creatinine 1.65, LFTs normal, TSH mildly elevated, free T3 and free T4 normal, BNP 502, hemoglobin 12.3 Labs (6/17): BNP 737, K 4.4, creatinine 1.72 Labs  (7/17): LDL 136, K 4.9, creatinine 1.79 Labs (8/17): BNP 239, K 3.8, creatinine 2.17 Labs (10/17): LDL 74, HDL 34, LFTs normal Labs (6/42): K 5, creatinine 1.67, BNP 774 Labs (6/18): K 3.3, creatinine 1.82 Labs 02/06/2017: K 4.3 Creatinine 1.78.  Labs (9/18): K 4.1, creatinine 1.73  PMH: 1. CAD: Late presentation inferior MI in 3/17.  - LHC showed totally occluded pRCA with L=>R collaterals, 95% pLAD, 70% ramus.   - CABG with LIMA-LAD, SVG-D, SVG-ramus, SVG-RCA.  - Repeat echo in 3/17 after CABG showed patent LIMA and SVG-RCA but SVG-D and SVG- ramus had slow flow with small, diffusely diseased distal targets.  - Echo (6/17) with EF 40-45%, inferior severe hypokinesis, mildly dilated RV with normal systolic function, PASP 30 mmHg.  2. Chronic systolic CHF: Ischemic cardiomyopathy.   - Echo (3/17) with EF 30-35%, septal and inferior AK.   - Echo (7/18): EF 40-45%, moderate FBSH, grade II diastolic dysfunction, RV mildly dilated with normal systolic function.  3. RV failure: was on Revatio, now off.  4. CKD: Stage 3.  5. Atrial flutter: Transient, post-op.   - DCCV to NSR in 9/18.  6. VT: Post-op CABG.  7. Carotid stenosis: Right CEA 3/17.  - Carotid dopplers (9/18): 1-39% LICA, s/p R CEA.  8. PAD: Peripheral artery dopplers (7/17) with > 50% L CIA stenosis, occluded distal external iliac, occluded right SFA, occluded left SFA.  9. Peripheral neuropathy  SH: Married, former maintenance  supervisor now unemployed, no ETOH or smoking.   Family History  Problem Relation Age of Onset  . Heart disease Mother   . Failure to thrive Father   . Congestive Heart Failure Father        Deceased  . Breast cancer Sister        Living   Review of systems complete and found to be negative unless listed in HPI.   Current Outpatient Medications  Medication Sig Dispense Refill  . apixaban (ELIQUIS) 5 MG TABS tablet Take 1 tablet (5 mg total) by mouth 2 (two) times daily. 60 tablet 6  . carvedilol  (COREG) 6.25 MG tablet Take 1 tablet (6.25 mg total) by mouth 2 (two) times daily. 60 tablet 3  . DULoxetine (CYMBALTA) 30 MG capsule Take 30 mg by mouth daily.    Marland Kitchen gabapentin (NEURONTIN) 300 MG capsule Take 1 capsule (300 mg total) by mouth 2 (two) times daily. 60 capsule 1  . hydrALAZINE (APRESOLINE) 25 MG tablet TAKE 1 TABLET BY MOUTH EVERY 8 HOURS 90 tablet 3  . isosorbide mononitrate (IMDUR) 30 MG 24 hr tablet TAKE 1 TABLET BY MOUTH ONCE DAILY 30 tablet 3  . KLOR-CON M20 20 MEQ tablet TAKE 2 TABLETS BY MOUTH DAILY 60 tablet 6  . QUEtiapine (SEROQUEL) 25 MG tablet Take 12.5-25 mg by mouth at bedtime.     . ranitidine (ZANTAC) 150 MG tablet Take 150 mg by mouth 2 (two) times daily.    . rosuvastatin (CRESTOR) 40 MG tablet TAKE 1 TABLET BY MOUTH ONCE DAILY 30 tablet 11  . senna-docusate (SENOKOT-S) 8.6-50 MG tablet Take 1 tablet by mouth at bedtime. For constipation 30 tablet 0  . torsemide (DEMADEX) 20 MG tablet Take 2 tablets (40 mg total) by mouth daily. Start on 03/26/17 60 tablet 3   No current facility-administered medications for this encounter.    Vitals:   08/24/17 1349  BP: (!) 149/92  Pulse: 79  SpO2: 97%  Weight: 221 lb 12.8 oz (100.6 kg)    Wt Readings from Last 3 Encounters:  08/24/17 221 lb 12.8 oz (100.6 kg)  05/19/17 254 lb 12.8 oz (115.6 kg)  05/04/17 263 lb 9.6 oz (119.6 kg)    Physical Exam General: Well appearing. No resp difficulty. HEENT: Normal Neck: Supple. JVP 5-6. Carotids 2+ bilat; no bruits. No thyromegaly or nodule noted. Cor: PMI nondisplaced. RRR, No M/G/R noted Lungs: CTAB, normal effort. Abdomen: Soft, non-tender, non-distended, no HSM. No bruits or masses. +BS  Extremities: No cyanosis, clubbing, or rash. R and LLE no edema.  Neuro: Alert & orientedx3, cranial nerves grossly intact. moves all 4 extremities w/o difficulty. Affect pleasant   Assessment/Plan: 1. CAD: s/p CABG - No s/s of ischemia.     - Continue Crestor.  - No ASA with  Eliquis.  2. Atrial flutter: Initially occurred post operatively and was transient. Noted to be in Aflutter at office visit in 7/18 and again at recent hospitalization in 8/18. He has had DCCV in 9/18 and is back in NSR.  He saw Dr. Johney Frame but wants to hold off on flutter ablation for now.  - EKG today shows NSR.  - Continue Eliquis 5 mg BID.  - If flutter recurs, he agrees to ablation.  3. Chronic systolic CHF: Ischemic cardiomyopathy.  Echo in 3/17 with EF 30-35%, improved to 40-45% on echo in 6/17.  Echo 02/2017 EF 40-45%.  NYHA II, not volume overloaded on exam.  - Continue Coreg 6.25 mg  bid.  - Continue Hydralazine 25 mg TID  - Continue Imdur 30 daily.  - Continue torsemide 40 mg daily. - Add spiro 12.5 mg daily. BMET today and recheck 10 days.  - EF above ICD range.   5. VT: No further.  - Off amiodarone.  6. CKD stage III:  - BMET today. Last creatinine 1.73.  7. Carotid stenosis:  - s/p R CEA in 2017. No change.  8. Hyperlipidemia:  - Continue Crestor. No change.  9. PAD:  - Minimal claudication currently. - Follows with [facility].  10. Obesity - He has had a significant amount of weight loss. Congratulated on his success.    Doing well over all. Adding spiro as above. BMET today and repeat 10 days. RTC 3 months.   Graciella Freer, PA-C  08/24/2017   Patient seen with PA, agree with the above note.  He is doing quite well, weight down 33 lbs with combination of diet and exercise. No exertional dyspnea or chest pain.  No claudication.   - Add spironolactone 12.5 daily, BMET today and again in 10 days.  - Check lipids today.   followup in 3 months.   [name] 08/24/2017

## 2017-08-24 NOTE — Patient Instructions (Signed)
START Spironolactone 12.5mg  daily.  Routine lab work today. Will notify you of abnormal results  Repeat labs in 10 days.  Follow up with Dr.McLean in 3 months

## 2017-08-25 ENCOUNTER — Other Ambulatory Visit (HOSPITAL_COMMUNITY): Payer: Self-pay | Admitting: Cardiology

## 2017-08-29 ENCOUNTER — Other Ambulatory Visit (HOSPITAL_COMMUNITY): Payer: Self-pay | Admitting: *Deleted

## 2017-08-29 MED ORDER — SPIRONOLACTONE 25 MG PO TABS: 12.5000 mg | ORAL_TABLET | Freq: Every day | ORAL | 3 refills | Status: DC

## 2017-09-05 ENCOUNTER — Ambulatory Visit (HOSPITAL_COMMUNITY)
Admission: RE | Admit: 2017-09-05 | Discharge: 2017-09-05 | Disposition: A | Discharge status: Home / Self Care | Payer: Medicaid Other | Source: Ambulatory Visit | Attending: Cardiology | Admitting: Cardiology

## 2017-09-05 DIAGNOSIS — I5022 Chronic systolic (congestive) heart failure: Secondary | ICD-10-CM | POA: Diagnosis present

## 2017-09-05 LAB — BASIC METABOLIC PANEL
Anion gap: 11 (ref 5–15)
BUN: 16 mg/dL (ref 6–20)
CHLORIDE: 102 mmol/L (ref 101–111)
CO2: 26 mmol/L (ref 22–32)
Calcium: 9.5 mg/dL (ref 8.9–10.3)
Creatinine, Ser: 1.22 mg/dL (ref 0.61–1.24)
GFR calc Af Amer: >60 mL/min (ref >60)
GFR calc non Af Amer: >60 mL/min (ref >60)
GLUCOSE: 132 mg/dL — AB (ref 65–99)
POTASSIUM: 4.2 mmol/L (ref 3.5–5.1)
Sodium: 139 mmol/L (ref 135–145)

## 2017-09-19 ENCOUNTER — Other Ambulatory Visit (HOSPITAL_COMMUNITY): Payer: Self-pay | Admitting: *Deleted

## 2017-09-19 MED ORDER — TORSEMIDE 20 MG PO TABS: 40.0000 mg | ORAL_TABLET | Freq: Every day | ORAL | 3 refills | Status: DC

## 2017-09-22 ENCOUNTER — Other Ambulatory Visit (HOSPITAL_COMMUNITY): Payer: Self-pay | Admitting: Cardiology

## 2017-11-10 ENCOUNTER — Observation Stay (HOSPITAL_BASED_OUTPATIENT_CLINIC_OR_DEPARTMENT_OTHER): Payer: Medicaid Other

## 2017-11-10 ENCOUNTER — Observation Stay (HOSPITAL_COMMUNITY): Payer: Medicaid Other | Admitting: Anesthesiology

## 2017-11-10 ENCOUNTER — Other Ambulatory Visit: Payer: Self-pay

## 2017-11-10 ENCOUNTER — Encounter (HOSPITAL_COMMUNITY)
Admission: EM | Disposition: A | Discharge status: Home / Self Care | Payer: Self-pay | Source: Home / Self Care | Attending: Emergency Medicine

## 2017-11-10 ENCOUNTER — Observation Stay (HOSPITAL_COMMUNITY)
Admission: EM | Admit: 2017-11-10 | Discharge: 2017-11-11 | Disposition: A | Discharge status: Home / Self Care | Payer: Medicaid Other | Attending: Adult Health | Admitting: Adult Health

## 2017-11-10 ENCOUNTER — Emergency Department (HOSPITAL_COMMUNITY): Payer: Medicaid Other

## 2017-11-10 ENCOUNTER — Telehealth: Payer: Self-pay | Admitting: Physician Assistant

## 2017-11-10 ENCOUNTER — Encounter (HOSPITAL_COMMUNITY): Payer: Self-pay

## 2017-11-10 DIAGNOSIS — Z79899 Other long term (current) drug therapy: Secondary | ICD-10-CM | POA: Diagnosis not present

## 2017-11-10 DIAGNOSIS — J449 Chronic obstructive pulmonary disease, unspecified: Secondary | ICD-10-CM | POA: Diagnosis not present

## 2017-11-10 DIAGNOSIS — E785 Hyperlipidemia, unspecified: Secondary | ICD-10-CM | POA: Insufficient documentation

## 2017-11-10 DIAGNOSIS — Z7901 Long term (current) use of anticoagulants: Secondary | ICD-10-CM | POA: Diagnosis not present

## 2017-11-10 DIAGNOSIS — I779 Disorder of arteries and arterioles, unspecified: Secondary | ICD-10-CM | POA: Diagnosis present

## 2017-11-10 DIAGNOSIS — N183 Chronic kidney disease, stage 3 unspecified: Secondary | ICD-10-CM | POA: Diagnosis present

## 2017-11-10 DIAGNOSIS — R55 Syncope and collapse: Secondary | ICD-10-CM

## 2017-11-10 DIAGNOSIS — I2510 Atherosclerotic heart disease of native coronary artery without angina pectoris: Secondary | ICD-10-CM | POA: Diagnosis not present

## 2017-11-10 DIAGNOSIS — Z951 Presence of aortocoronary bypass graft: Secondary | ICD-10-CM | POA: Insufficient documentation

## 2017-11-10 DIAGNOSIS — I130 Hypertensive heart and chronic kidney disease with heart failure and stage 1 through stage 4 chronic kidney disease, or unspecified chronic kidney disease: Secondary | ICD-10-CM | POA: Insufficient documentation

## 2017-11-10 DIAGNOSIS — I739 Peripheral vascular disease, unspecified: Secondary | ICD-10-CM | POA: Insufficient documentation

## 2017-11-10 DIAGNOSIS — Z8673 Personal history of transient ischemic attack (TIA), and cerebral infarction without residual deficits: Secondary | ICD-10-CM | POA: Diagnosis not present

## 2017-11-10 DIAGNOSIS — R001 Bradycardia, unspecified: Secondary | ICD-10-CM

## 2017-11-10 DIAGNOSIS — Z888 Allergy status to other drugs, medicaments and biological substances status: Secondary | ICD-10-CM | POA: Insufficient documentation

## 2017-11-10 DIAGNOSIS — I5022 Chronic systolic (congestive) heart failure: Secondary | ICD-10-CM

## 2017-11-10 DIAGNOSIS — I340 Nonrheumatic mitral (valve) insufficiency: Secondary | ICD-10-CM | POA: Diagnosis not present

## 2017-11-10 DIAGNOSIS — I4892 Unspecified atrial flutter: Secondary | ICD-10-CM | POA: Diagnosis not present

## 2017-11-10 DIAGNOSIS — I110 Hypertensive heart disease with heart failure: Secondary | ICD-10-CM | POA: Diagnosis present

## 2017-11-10 HISTORY — PX: LOOP RECORDER INSERTION: EP1214

## 2017-11-10 HISTORY — PX: CARDIOVERSION: SHX1299

## 2017-11-10 LAB — CBC WITH DIFFERENTIAL/PLATELET
BASOS ABS: 0.1 10*3/uL (ref 0.0–0.1)
BASOS PCT: 1 %
EOS PCT: 1 %
Eosinophils Absolute: 0.1 10*3/uL (ref 0.0–0.7)
HCT: 43.5 % (ref 39.0–52.0)
Hemoglobin: 14.2 g/dL (ref 13.0–17.0)
Lymphocytes Relative: 12 %
Lymphs Abs: 1.2 10*3/uL (ref 0.7–4.0)
MCH: 29.2 pg (ref 26.0–34.0)
MCHC: 32.6 g/dL (ref 30.0–36.0)
MCV: 89.5 fL (ref 78.0–100.0)
MONO ABS: 0.6 10*3/uL (ref 0.1–1.0)
Monocytes Relative: 6 %
Neutro Abs: 7.7 10*3/uL (ref 1.7–7.7)
Neutrophils Relative %: 80 %
Platelets: 201 10*3/uL (ref 150–400)
RBC: 4.86 MIL/uL (ref 4.22–5.81)
RDW: 15 % (ref 11.5–15.5)
WBC: 9.6 10*3/uL (ref 4.0–10.5)

## 2017-11-10 LAB — URINALYSIS, ROUTINE W REFLEX MICROSCOPIC
Bilirubin Urine: NEGATIVE
Glucose, UA: NEGATIVE mg/dL
Hgb urine dipstick: NEGATIVE
KETONES UR: NEGATIVE mg/dL
LEUKOCYTES UA: NEGATIVE
Nitrite: NEGATIVE
PH: 7 (ref 5.0–8.0)
PROTEIN: NEGATIVE mg/dL
Specific Gravity, Urine: 1.004 — ABNORMAL LOW (ref 1.005–1.030)

## 2017-11-10 LAB — COMPREHENSIVE METABOLIC PANEL
ALBUMIN: 4.1 g/dL (ref 3.5–5.0)
ALT: 19 U/L (ref 17–63)
AST: 26 U/L (ref 15–41)
Alkaline Phosphatase: 118 U/L (ref 38–126)
Anion gap: 13 (ref 5–15)
BUN: 12 mg/dL (ref 6–20)
CO2: 24 mmol/L (ref 22–32)
Calcium: 9.4 mg/dL (ref 8.9–10.3)
Chloride: 103 mmol/L (ref 101–111)
Creatinine, Ser: 1.31 mg/dL — ABNORMAL HIGH (ref 0.61–1.24)
GFR calc Af Amer: >60 mL/min (ref >60)
GFR, EST NON AFRICAN AMERICAN: 59 mL/min — AB (ref >60)
GLUCOSE: 142 mg/dL — AB (ref 65–99)
POTASSIUM: 4.2 mmol/L (ref 3.5–5.1)
Sodium: 140 mmol/L (ref 135–145)
Total Bilirubin: 0.9 mg/dL (ref 0.3–1.2)
Total Protein: 7.2 g/dL (ref 6.5–8.1)

## 2017-11-10 LAB — TROPONIN I: TROPONIN I: 0.04 ng/mL — AB (ref <0.03)

## 2017-11-10 LAB — ECHOCARDIOGRAM COMPLETE
HEIGHTINCHES: 70 in
Weight: 3446.4 oz

## 2017-11-10 LAB — MAGNESIUM: Magnesium: 2.2 mg/dL (ref 1.7–2.4)

## 2017-11-10 SURGERY — CARDIOVERSION
Anesthesia: General

## 2017-11-10 SURGERY — LOOP RECORDER INSERTION

## 2017-11-10 MED ORDER — SENNOSIDES-DOCUSATE SODIUM 8.6-50 MG PO TABS
1.0000 | ORAL_TABLET | Freq: Every day | ORAL | Status: DC
  Administered 2017-11-10: 1 via ORAL
  Filled 2017-11-10: qty 1

## 2017-11-10 MED ORDER — DULOXETINE HCL 30 MG PO CPEP
30.0000 mg | ORAL_CAPSULE | Freq: Every day | ORAL | Status: DC
  Administered 2017-11-11: 30 mg via ORAL
  Filled 2017-11-10: qty 1

## 2017-11-10 MED ORDER — ACETAMINOPHEN 325 MG PO TABS: 650.0000 mg | ORAL_TABLET | ORAL | Status: DC | PRN

## 2017-11-10 MED ORDER — SODIUM CHLORIDE 0.9% FLUSH
3.0000 mL | Freq: Two times a day (BID) | INTRAVENOUS | Status: DC
  Administered 2017-11-10 (×2): 3 mL via INTRAVENOUS

## 2017-11-10 MED ORDER — ACETAMINOPHEN 500 MG PO TABS: 1000.0000 mg | ORAL_TABLET | ORAL | Status: DC | PRN

## 2017-11-10 MED ORDER — ROSUVASTATIN CALCIUM 40 MG PO TABS
40.0000 mg | ORAL_TABLET | Freq: Every day | ORAL | Status: DC
  Administered 2017-11-11: 40 mg via ORAL
  Filled 2017-11-10: qty 1

## 2017-11-10 MED ORDER — PERFLUTREN LIPID MICROSPHERE
1.0000 mL | INTRAVENOUS | Status: AC | PRN
  Filled 2017-11-10: qty 10

## 2017-11-10 MED ORDER — QUETIAPINE FUMARATE 25 MG PO TABS
25.0000 mg | ORAL_TABLET | Freq: Every day | ORAL | Status: DC
  Administered 2017-11-10: 25 mg via ORAL
  Filled 2017-11-10: qty 2
  Filled 2017-11-10: qty 1

## 2017-11-10 MED ORDER — LIDOCAINE-EPINEPHRINE 1 %-1:100000 IJ SOLN
INTRAMUSCULAR | Status: AC
  Filled 2017-11-10: qty 1

## 2017-11-10 MED ORDER — CARVEDILOL 6.25 MG PO TABS: 6.2500 mg | ORAL_TABLET | Freq: Two times a day (BID) | ORAL | Status: DC

## 2017-11-10 MED ORDER — ISOSORBIDE MONONITRATE ER 30 MG PO TB24
30.0000 mg | ORAL_TABLET | Freq: Every day | ORAL | Status: DC
  Administered 2017-11-11: 30 mg via ORAL
  Filled 2017-11-10: qty 1

## 2017-11-10 MED ORDER — PROPOFOL 10 MG/ML IV BOLUS
INTRAVENOUS | Status: DC | PRN
  Administered 2017-11-10: 70 mg via INTRAVENOUS

## 2017-11-10 MED ORDER — TORSEMIDE 20 MG PO TABS
40.0000 mg | ORAL_TABLET | Freq: Every day | ORAL | Status: DC
  Administered 2017-11-11: 40 mg via ORAL
  Filled 2017-11-10: qty 2

## 2017-11-10 MED ORDER — APIXABAN 5 MG PO TABS
5.0000 mg | ORAL_TABLET | Freq: Two times a day (BID) | ORAL | Status: DC
  Administered 2017-11-10 – 2017-11-11 (×2): 5 mg via ORAL
  Filled 2017-11-10 (×3): qty 1

## 2017-11-10 MED ORDER — HYDRALAZINE HCL 25 MG PO TABS
25.0000 mg | ORAL_TABLET | Freq: Three times a day (TID) | ORAL | Status: DC
  Administered 2017-11-10 – 2017-11-11 (×2): 25 mg via ORAL
  Filled 2017-11-10 (×2): qty 1

## 2017-11-10 MED ORDER — SODIUM CHLORIDE 0.9% FLUSH
3.0000 mL | Freq: Two times a day (BID) | INTRAVENOUS | Status: DC
  Administered 2017-11-10 – 2017-11-11 (×3): 3 mL via INTRAVENOUS

## 2017-11-10 MED ORDER — SODIUM CHLORIDE 0.9 % IV SOLN
250.0000 mL | INTRAVENOUS | Status: DC
  Administered 2017-11-10: 250 mL via INTRAVENOUS

## 2017-11-10 MED ORDER — GABAPENTIN 300 MG PO CAPS
300.0000 mg | ORAL_CAPSULE | Freq: Two times a day (BID) | ORAL | Status: DC
  Administered 2017-11-10 – 2017-11-11 (×2): 300 mg via ORAL
  Filled 2017-11-10 (×2): qty 1

## 2017-11-10 MED ORDER — SPIRONOLACTONE 12.5 MG HALF TABLET
12.5000 mg | ORAL_TABLET | Freq: Every day | ORAL | Status: DC
  Filled 2017-11-10 (×2): qty 1

## 2017-11-10 MED ORDER — SODIUM CHLORIDE 0.9 % IV SOLN
250.0000 mL | INTRAVENOUS | Status: DC | PRN
  Administered 2017-11-10: 14:00:00 via INTRAVENOUS

## 2017-11-10 MED ORDER — POTASSIUM CHLORIDE CRYS ER 20 MEQ PO TBCR
40.0000 meq | EXTENDED_RELEASE_TABLET | Freq: Every day | ORAL | Status: DC
  Administered 2017-11-11: 40 meq via ORAL
  Filled 2017-11-10: qty 2

## 2017-11-10 MED ORDER — SODIUM CHLORIDE 0.9% FLUSH: 3.0000 mL | INTRAVENOUS | Status: DC | PRN

## 2017-11-10 MED ORDER — LIDOCAINE-EPINEPHRINE 1 %-1:100000 IJ SOLN
INTRAMUSCULAR | Status: DC | PRN
  Administered 2017-11-10: 20 mL

## 2017-11-10 MED ORDER — FAMOTIDINE 20 MG PO TABS
20.0000 mg | ORAL_TABLET | Freq: Two times a day (BID) | ORAL | Status: DC
  Administered 2017-11-10 – 2017-11-11 (×2): 20 mg via ORAL
  Filled 2017-11-10 (×2): qty 1

## 2017-11-10 MED ORDER — LIDOCAINE 2% (20 MG/ML) 5 ML SYRINGE
INTRAMUSCULAR | Status: DC | PRN
  Administered 2017-11-10: 100 mg via INTRAVENOUS

## 2017-11-10 MED ORDER — ONDANSETRON HCL 4 MG/2ML IJ SOLN: 4.0000 mg | Freq: Four times a day (QID) | INTRAMUSCULAR | Status: DC | PRN

## 2017-11-10 SURGICAL SUPPLY — 2 items
LOOP REVEAL LINQSYS (Prosthesis & Implant Heart) ×2 IMPLANT
PACK LOOP INSERTION (CUSTOM PROCEDURE TRAY) ×2 IMPLANT

## 2017-11-10 NOTE — ED Notes (Signed)
Pt requests food and drink. Reports that Dr. Andrey Cota he can have some.

## 2017-11-10 NOTE — Telephone Encounter (Signed)
Paged by answering service. The patient passed out. EMS is assessing the patient during my return call. Advised emergency room visit for evaluation.

## 2017-11-10 NOTE — ED Triage Notes (Signed)
Pt from home with a-flutter pt has history of such and has been feeling off the past 2 days. Pt had a syncopal episode at home

## 2017-11-10 NOTE — Interval H&P Note (Signed)
History and Physical Interval Note:  11/10/2017 1:47 PM  Christopher [name]  has presented today for surgery, with the diagnosis of a flutter  The various methods of treatment have been discussed with the patient and family. After consideration of risks, benefits and other options for treatment, the patient has consented to  Procedure(s): CARDIOVERSION (N/A) as a surgical intervention .  The patient's history has been reviewed, patient examined, no change in status, stable for surgery.  I have reviewed the patient's chart and labs.  Questions were answered to the patient's satisfaction.     Christopher [name] [employer]

## 2017-11-10 NOTE — Consult Note (Addendum)
Cardiology Consultation:   Patient ID: SIVAN PAYAMPS; [id number]; January 27, 1961   Admit date: 11/10/2017 Date of Consult: 11/10/2017  Primary Care Provider: Shirlean Mylar, MD Primary Cardiologist: Dr Shirlee Latch Primary Electrophysiologist:  Dr Johney Frame   Patient Profile:   Christopher [name] is a 57 y.o. male with a hx of CAD and PAF who is being seen today for the evaluation of syncope at the request of Dr Shirlee Latch.  History of Present Illness:   Christopher [name] is a 57 y/o male with a history of CAD, s/p CABG x 10 October 2015, HTN, mild renal insufficiency, carotid disease s/p RCE March 2017, and PAF-flutter. He is on Eliquis (CHADS VASC=3). He had been doing well until this admission, he was in NSR in Jan 2019. He had discussed the possibility of flutter ablation back in the fall but declined. Today he presented to the ED after a syncopal spell (witnessed). He was found to be in atrial flutter with VR 50's. He underwent DCCV today to NSR. Dr Shirlee Latch would like EP to see the patient for possible LINQ placement and possibly reopening discussion of flutter ablation.   Past Medical History:  Diagnosis Date  . Anxiety   . Atypical atrial flutter (HCC)   . CHF (congestive heart failure) (HCC)    EF 45%  . CKD (chronic kidney disease), stage III (HCC)    Hattie Perch 03/23/2017  . Complete heart block (HCC) 10/20/2015   in setting of inferior MI, now resolved  . COPD (chronic obstructive pulmonary disease) (HCC)    pt denies this hx on 03/23/2017  . Coronary artery disease   . High cholesterol   . Hypertension   . Myocardial infarction [facility]) 10/2015   "massive one; saw scar tissue of possible previous MI maybe in 2013" (03/23/2017)  . Overweight   . Stenosis of right carotid artery   . TIA (transient ischemic attack) 2016  . Ventricular tachycardia (HCC) 10/2015   in setting of MI, now resolved    Past Surgical History:  Procedure Laterality Date  . CARDIAC CATHETERIZATION N/A 10/20/2015   Procedure:  Left Heart Cath and Coronary Angiography;  Surgeon: Tonny Bollman, MD;  Location: [facility] INVASIVE CV LAB;  Service: Cardiovascular;  Laterality: N/A;  . CARDIAC CATHETERIZATION N/A 11/02/2015   Procedure: Right/Left Heart Cath and Coronary/Graft Angiography;  Surgeon: Laurey Morale, MD;  Location: [facility] INVASIVE CV LAB;  Service: Cardiovascular;  Laterality: N/A;  . CARDIOVERSION N/A 05/04/2017   Procedure: CARDIOVERSION;  Surgeon: Laurey Morale, MD;  Location: [facility] ENDOSCOPY;  Service: Cardiovascular;  Laterality: N/A;  . CORONARY ARTERY BYPASS GRAFT N/A 10/27/2015   Procedure: CORONARY ARTERY BYPASS GRAFTING (CABG) x four, using left internal mammary artery and rt leg greater saphenous vein harvested endoscopically;  Surgeon: Kerin Perna, MD;  Location: [facility] OR;  Service: Open Heart Surgery;  Laterality: N/A;  . ENDARTERECTOMY Right 10/27/2015   Procedure: RIGHT ENDARTERECTOMY CAROTID with patch angioplasty using Xenosure bovine pericardium patch;  Surgeon: Fransisco Hertz, MD;  Location: [facility] OR;  Service: Vascular;  Laterality: Right;  . EYE MUSCLE SURGERY Left 1970s   "lazy eye"  . LACERATION REPAIR Right 2003   RUE; "fell thru plate-glass window"  . TEE WITHOUT CARDIOVERSION N/A 10/27/2015   Procedure: TRANSESOPHAGEAL ECHOCARDIOGRAM (TEE);  Surgeon: Kerin Perna, MD;  Location: [facility] OR;  Service: Open Heart Surgery;  Laterality: N/A;     Home Medications:  Prior to Admission medications   Medication Sig Start Date End Date Taking?  Authorizing Provider  acetaminophen (TYLENOL) 500 MG tablet Take 1,000 mg by mouth as needed for mild pain.   Yes [provider]  carvedilol (COREG) 6.25 MG tablet TAKE 1 TABLET BY MOUTH TWICE DAILY 09/25/17  Yes Bensimhon, Bevelyn Buckles, MD  DULoxetine (CYMBALTA) 30 MG capsule Take 30 mg by mouth daily.   Yes [provider]  ELIQUIS 5 MG TABS tablet TAKE 1 TABLET BY MOUTH TWICE DAILY 08/28/17  Yes Laurey Morale, MD  gabapentin (NEURONTIN) 300 MG capsule  Take 1 capsule (300 mg total) by mouth 2 (two) times daily. 03/24/17  Yes Rai, Ripudeep K, MD  hydrALAZINE (APRESOLINE) 25 MG tablet TAKE 1 TABLET BY MOUTH EVERY 8 HOURS 08/15/17  Yes Laurey Morale, MD  isosorbide mononitrate (IMDUR) 30 MG 24 hr tablet TAKE 1 TABLET BY MOUTH ONCE DAILY 08/15/17  Yes Laurey Morale, MD  KLOR-CON M20 20 MEQ tablet TAKE 2 TABLETS BY MOUTH DAILY 08/03/17  Yes Laurey Morale, MD  QUEtiapine (SEROQUEL) 50 MG tablet Take 25-50 mg by mouth at bedtime. 10/20/17  Yes [provider]  ranitidine (ZANTAC) 150 MG tablet Take 150 mg by mouth 2 (two) times daily.   Yes [provider]  rosuvastatin (CRESTOR) 40 MG tablet TAKE 1 TABLET BY MOUTH ONCE DAILY 05/23/17  Yes Bensimhon, Bevelyn Buckles, MD  senna-docusate (SENOKOT-S) 8.6-50 MG tablet Take 1 tablet by mouth at bedtime. For constipation 12/01/15  Yes Love, Evlyn Kanner, PA-C  spironolactone (ALDACTONE) 25 MG tablet Take 0.5 tablets (12.5 mg total) by mouth daily. 08/29/17 11/27/17 Yes Laurey Morale, MD  torsemide (DEMADEX) 20 MG tablet Take 2 tablets (40 mg total) by mouth daily. Start on 03/26/17 09/19/17  Yes Laurey Morale, MD    Inpatient Medications: Scheduled Meds: . apixaban  5 mg Oral BID  . [START ON 11/11/2017] DULoxetine  30 mg Oral Daily  . famotidine  20 mg Oral BID  . gabapentin  300 mg Oral BID  . hydrALAZINE  25 mg Oral Q8H  . [START ON 11/11/2017] isosorbide mononitrate  30 mg Oral Daily  . [START ON 11/11/2017] potassium chloride SA  40 mEq Oral Daily  . QUEtiapine  25-50 mg Oral QHS  . [START ON 11/11/2017] rosuvastatin  40 mg Oral Daily  . senna-docusate  1 tablet Oral QHS  . sodium chloride flush  3 mL Intravenous Q12H  . sodium chloride flush  3 mL Intravenous Q12H  . spironolactone  12.5 mg Oral Daily  . [START ON 11/11/2017] torsemide  40 mg Oral Daily   Continuous Infusions: . sodium chloride    . sodium chloride Stopped (11/10/17 1439)   PRN Meds: sodium chloride, acetaminophen,  acetaminophen, ondansetron (ZOFRAN) IV, sodium chloride flush, sodium chloride flush  Allergies:    Allergies  Allergen Reactions  . Losartan Shortness Of Breath  . Entresto [Sacubitril-Valsartan]     Kidney function  . Elita Quick [Ceftazidime] Rash    Rash with blisters  . Lisinopril Rash    Social History:   Social History   Socioeconomic History  . Marital status: Single    Spouse name: Not on file  . Number of children: Not on file  . Years of education: Not on file  . Highest education level: Not on file  Occupational History  . Not on file  Social Needs  . Financial resource strain: Not on file  . Food insecurity:    Worry: Not on file    Inability: Not on  file  . Transportation needs:    Medical: Not on file    Non-medical: Not on file  Tobacco Use  . Smoking status: Former Smoker    Packs/day: 1.00    Years: 23.00    Pack years: 23.00    Types: Cigarettes    Last attempt to quit: 2004    Years since quitting: 15.2  . Smokeless tobacco: Never Used  Substance and Sexual Activity  . Alcohol use: No    Alcohol/week: 0.0 oz    Comment: Previously drank 24 beer/daily for 27 years, but has been sober since January 08, 2002  . Drug use: Yes    Types: Marijuana    Comment: 03/23/2017 "daily"  . Sexual activity: Yes  Lifestyle  . Physical activity:    Days per week: Not on file    Minutes per session: Not on file  . Stress: Not on file  Relationships  . Social connections:    Talks on phone: Not on file    Gets together: Not on file    Attends religious service: Not on file    Active member of club or organization: Not on file    Attends meetings of clubs or organizations: Not on file    Relationship status: Not on file  . Intimate partner violence:    Fear of current or ex partner: Not on file    Emotionally abused: Not on file    Physically abused: Not on file    Forced sexual activity: Not on file  Other Topics Concern  . Not on file  Social History  Narrative   Lives with girlfriend in an apartment on the second floor.     Works as a Teaching laboratory technician for apartment complex.     Has no children.  Education: 10th grade.    Family History:    Family History  Problem Relation Age of Onset  . Heart disease Mother   . Failure to thrive Father   . Congestive Heart Failure Father        Deceased  . Breast cancer Sister        Living     ROS:  Please see the history of present illness.   All other ROS reviewed and negative.     Physical Exam/Data:   Vitals:   11/10/17 1436 11/10/17 1500 11/10/17 1530 11/10/17 1545  BP:  134/83 (!) 147/88   Pulse: 65 66 64 65  Resp: 15 15 15 13   Temp:      TempSrc:      SpO2: 97% 99% 99% 100%  Weight:      Height:        Intake/Output Summary (Last 24 hours) at 11/10/2017 1602 Last data filed at 11/10/2017 1439 Gross per 24 hour  Intake 356 ml  Output -  Net 356 ml   Filed Weights   11/10/17 0838 11/10/17 1318  Weight: 215 lb 6.4 oz (97.7 kg) 215 lb 6.4 oz (97.7 kg)   Body mass index is 30.91 kg/m.  General:  Well nourished, well developed, in no acute distress. BMI 30 HEENT: normal Lymph: no adenopathy Neck: no JVD Endocrine:  No thryomegaly Vascular: No carotid bruits; FA pulses 2+ bilaterally without bruits  Cardiac:  normal S1, S2; RRR; no murmur  Lungs:  clear to auscultation bilaterally, no wheezing, rhonchi or rales  Abd: soft, nontender, no hepatomegaly  Ext: no edema Musculoskeletal:  No deformities, BUE and BLE strength normal and equal Skin: warm  and dry  Neuro:  CNs 2-12 intact, no focal abnormalities noted Psych:  Normal affect   EKG:  The EKG was personally reviewed and demonstrates:  A flutter with VR 60  Telemetry:  Telemetry was personally reviewed and demonstrates:  NSR now s/p DCCV but some of the strips from the past hour look like he is trying to go back into flutter  Relevant CV Studies: Echo 03/02/17- Study Conclusions  - Left ventricle: The  cavity size was normal. There was moderate   focal basal hypertrophy. Systolic function was mildly to   moderately reduced. The estimated ejection fraction was in the   range of 40% to 45%. Diffuse hypokinesis. There is akinesis of   the apical myocardium. Features are consistent with a   pseudonormal left ventricular filling pattern, with concomitant   abnormal relaxation and increased filling pressure (grade 2   diastolic dysfunction). - Aorta: Aortic root dimension: 40 mm (ED). - Aortic root: The aortic root was mildly dilated. - Right ventricle: The cavity size was mildly dilated. Wall   thickness was normal.   Laboratory Data:  Chemistry Recent Labs  Lab 11/10/17 0905  NA 140  K 4.2  CL 103  CO2 24  GLUCOSE 142*  BUN 12  CREATININE 1.31*  CALCIUM 9.4  GFRNONAA 59*  GFRAA >60  ANIONGAP 13    Recent Labs  Lab 11/10/17 0905  PROT 7.2  ALBUMIN 4.1  AST 26  ALT 19  ALKPHOS 118  BILITOT 0.9   Hematology Recent Labs  Lab 11/10/17 0905  WBC 9.6  RBC 4.86  HGB 14.2  HCT 43.5  MCV 89.5  MCH 29.2  MCHC 32.6  RDW 15.0  PLT 201   Cardiac Enzymes Recent Labs  Lab 11/10/17 0905  TROPONINI 0.04*   No results for input(s): TROPIPOC in the last 168 hours.  BNPNo results for input(s): BNP, PROBNP in the last 168 hours.  DDimer No results for input(s): DDIMER in the last 168 hours.  Radiology/Studies:  Ct Head Wo Contrast  Result Date: 11/10/2017 CLINICAL DATA:  Syncope EXAM: CT HEAD WITHOUT CONTRAST TECHNIQUE: Contiguous axial images were obtained from the base of the skull through the vertex without intravenous contrast. COMPARISON:  Head CT November 23, 2014 and brain MRI November 23, 2014 FINDINGS: Brain: Ventricles are normal in size and configuration. There is no intracranial mass, hemorrhage, extra-axial fluid collection, or midline shift. Patchy small vessel disease in the centra semiovale appears stable. No new gray-white compartment lesions are evident. No  acute infarct is appreciable. Vascular: No hyperdense vessels are evident. There is calcification in each carotid siphon region. Skull: Bony calvarium appears intact. Sinuses/Orbits: There is opacification with retention cysts in both maxillary antra, more on the left than on the right. There is mucosal thickening in multiple ethmoid air cells. Other paranasal sinuses are clear. Orbits appear symmetric bilaterally. Other: Mastoid air cells are clear. IMPRESSION: Patchy supratentorial small vessel disease. No acute infarct. No mass or hemorrhage. Foci of arterial vascular calcification noted. There are areas of paranasal sinus disease. Electronically Signed   By: Bretta Bang III M.D.   On: 11/10/2017 09:37   Dg Chest Port 1 View  Result Date: 11/10/2017 CLINICAL DATA:  Syncope EXAM: PORTABLE CHEST 1 VIEW COMPARISON:  April 02, 2017 FINDINGS: There is scarring in the left base. There is no edema or consolidation. Heart is mildly enlarged with pulmonary vascularity within normal limits. Patient is status post coronary artery bypass grafting. No adenopathy.  No bone lesions. No pneumothorax. IMPRESSION: Left base scarring. No edema or consolidation. Stable cardiac prominence. Electronically Signed   By: Bretta Bang III M.D.   On: 11/10/2017 09:20    Assessment and Plan:   1. Syncope: Patient presented in relatively slow atrial flutter.     - Luceal Hollibaugh hold Coreg for now given relatively slow HR.  Probably restart at discharge.   2. Atrial flutter: Recurrent, HR in 50s-60s.  He has had in the past, offered ablation but wanted to hold off. Consider DCCV today.  3. Chronic systolic CHF: Most recent echo with EF 40-45%, ischemic cardiomyopathy.  He is not volume overloaded on exam, NYHA class II symptoms.     5. CAD: S/p CABG.  Continue statin.  No ASA as on Eliquis.   6. Chronic anticoagulation: He has not missed Eliquis doses  Plan: Dr Elberta Fortis to see.Observe overnight.    ? Does he need a LINQ  if he decides to proceed with flutter ablation.   For questions or updates, please contact [facility] Please consult [email or web address] for contact info under Cardiology/STEMI.   Signed, Corine Shelter, PA-C  11/10/2017 4:02 PM   I have seen and examined this patient with Corine Shelter.  Agree with above, note added to reflect my findings.  On exam, RRR, no murmurs, lungs clear. Presented to the hospital with syncope and atrial flutter. Syncope with walking and had been up and active for some time. No obvious cause. Concern for bradyarrhythmia. Risks and benefits discussed and include bleeding and infection. Patient understands the risks and has agreed to the procedure. Would also like flutter ablation, Momin Misko arrange as outpatient.    Marqus Macphee M. Lovinia Snare MD 11/10/2017 4:40 PM

## 2017-11-10 NOTE — Progress Notes (Signed)
  Echocardiogram 2D Echocardiogram has been performed.  Tabby Beaston G Crysta Gulick 11/10/2017, 4:32 PM

## 2017-11-10 NOTE — Anesthesia Preprocedure Evaluation (Signed)
Anesthesia Evaluation  Patient identified by MRN, date of birth, ID band Patient awake    Reviewed: Allergy & Precautions, H&P , NPO status , Patient's Chart, lab work & pertinent test results, reviewed documented beta blocker date and time   History of Anesthesia Complications Negative for: history of anesthetic complications  Airway Mallampati: II  TM Distance: >3 FB Neck ROM: Full    Dental no notable dental hx. (+) Edentulous Upper, Partial Lower, Dental Advisory Given   Pulmonary COPD,  COPD inhaler, former smoker,    Pulmonary exam normal breath sounds clear to auscultation       Cardiovascular hypertension, Pt. on medications + CAD, + Past MI (recent MI), + Peripheral Vascular Disease (80% RIC stenosis) and +CHF  + dysrhythmias (complete heart block with recent MI)  Rhythm:Regular Rate:Normal  10/21/15 ECHO: EF 40-45%, infero-septal akinesis, anteroseptal hypokinesis, mild MR  Echo 7/18 - Left ventricle: The cavity size was normal. There was moderate focal basal hypertrophy. Systolic function was mildly to moderately reduced. The estimated ejection fraction was in the range of 40% to 45%. Diffuse hypokinesis. There is akinesis of the apical myocardium. Features are consistent with a pseudonormal left ventricular filling pattern, with concomitant abnormal relaxation and increased filling pressure (grade 2  diastolic dysfunction). - Aorta: Aortic root dimension: 40 mm (ED). - Aortic root: The aortic root was mildly dilated. - Right ventricle: The cavity size was mildly dilated. Wall   thickness was normal.   Neuro/Psych TIA   GI/Hepatic negative GI ROS, Elevated LFTs: h/o ETOH abuse   Endo/Other  Morbid obesity  Renal/GU negative Renal ROS     Musculoskeletal   Abdominal (+) + obese,   Peds  Hematology  (+) Blood dyscrasia (Hb 10.8), ,   Anesthesia Other Findings  The estimated ejection fraction was in the  range of 40% to 45%. Diffuse hypokinesis  Reproductive/Obstetrics                             Anesthesia Physical  Anesthesia Plan  ASA: III  Anesthesia Plan: General   Post-op Pain Management:    Induction: Intravenous  PONV Risk Score and Plan: 2 and Treatment may vary due to age or medical condition  Airway Management Planned: Mask  Additional Equipment:   Intra-op Plan:   Post-operative Plan:   Informed Consent: I have reviewed the patients History and Physical, chart, labs and discussed the procedure including the risks, benefits and alternatives for the proposed anesthesia with the patient or authorized representative who has indicated his/her understanding and acceptance.   Dental advisory given  Plan Discussed with: CRNA  Anesthesia Plan Comments:         Anesthesia Quick Evaluation

## 2017-11-10 NOTE — Anesthesia Procedure Notes (Signed)
Procedure Name: General with mask airway Date/Time: 11/10/2017 1:53 PM Performed by: Elliot Dally, CRNA Pre-anesthesia Checklist: Patient identified, Emergency Drugs available, Suction available and Patient being monitored Patient Re-evaluated:Patient Re-evaluated prior to induction Oxygen Delivery Method: Ambu bag Preoxygenation: Pre-oxygenation with 100% oxygen Induction Type: IV induction

## 2017-11-10 NOTE — Transfer of Care (Signed)
Immediate Anesthesia Transfer of Care Note  Patient: Christopher [name]  Procedure(s) Performed: CARDIOVERSION (N/A )  Patient Location: Endoscopy Unit  Anesthesia Type:General  Level of Consciousness: awake, alert  and oriented  Airway & Oxygen Therapy: Patient Spontanous Breathing and Patient connected to nasal cannula oxygen  Post-op Assessment: Report given to RN and Post -op Vital signs reviewed and stable  Post vital signs: Reviewed and stable  Last Vitals:  Vitals Value Taken Time  BP    Temp    Pulse    Resp    SpO2      Last Pain:  Vitals:   11/10/17 1318  TempSrc: Oral  PainSc: 0-No pain         Complications: No apparent anesthesia complications

## 2017-11-10 NOTE — ED Notes (Signed)
Patient transported to CT 

## 2017-11-10 NOTE — Procedures (Signed)
Electrical Cardioversion Procedure Note Christopher [name] [id number] July 19, 1961  Procedure: Electrical Cardioversion Indications:  Atrial Flutter  Procedure Details Consent: Risks of procedure as well as the alternatives and risks of each were explained to the (patient/caregiver).  Consent for procedure obtained. Time Out: Verified patient identification, verified procedure, site/side was marked, verified correct patient position, special equipment/implants available, medications/allergies/relevent history reviewed, required imaging and test results available.  Performed  Patient placed on cardiac monitor, pulse oximetry, supplemental oxygen as necessary.  Sedation given: Propofol Pacer pads placed anterior and posterior chest.  Cardioverted 1 time(s).  Cardioverted at 150J.  Evaluation Findings: Post procedure EKG shows: NSR Complications: None Patient did tolerate procedure well.   [name] 11/10/2017, 1:56 PM

## 2017-11-10 NOTE — H&P (Addendum)
Advanced Heart Failure Team History and Physical Note   PCP:  Shirlean Mylar, MD  PCP-Cardiology: No primary care provider on file.     Reason for Admission: Syncope/A flutter    HPI:    Christopher [name] is 57 year old with a history CAD s/p CABG x4, ICM. CKD, A flutter, PAD, carotid stenosis RCEA, Chronic systolic heart failure.   Admitted 8/21-8/27/18 with acute respiratory failure due to HCAP and HTN emergency. Also had rapid atrial flutter. Rate improved and DCCV was planned for outpatient as he had missed a few doses of Eliquis at home. Discharge weight was 270 pounds. He had DCCV to NSR in 9/18.  He was referred to Dr Johney Frame for an ablation but he elected to hold off on ablation.   Followed closely by Dr Shirlee Latch and was last seen 08/2017. That time time he was doing fine and had lost over 30 pounds. He wanted to hold off on ablation.   Weight at home has been 213 pounds. Taking all medications and has not missed any doses of eliquis. .   Arrived via EMS after syncopal episode earlier this morning. Says he was walking in the kitchen and passed out.  Denies dizziness. No fever or chills.   ED course: EKG showed A flutter. CT of head was negative. CXR no edema or consolidation. Pertinent labs WBC 9.6, hgb 14, UA negative.   ECHO  Left ventricle: The cavity size was normal. There was moderate   focal basal hypertrophy. Systolic function was mildly to   moderately reduced. The estimated ejection fraction was in the   range of 40% to 45%. Diffuse hypokinesis. There is akinesis of   the apical myocardium. Features are consistent with a   pseudonormal left ventricular filling pattern, with concomitant   abnormal relaxation and increased filling pressure (grade 2   diastolic dysfunction). - Aorta: Aortic root dimension: 40 mm (ED). - Aortic root: The aortic root was mildly dilated. - Right ventricle: The cavity size was mildly dilated. Wall   thickness was normal  Review of Systems:  [y] = yes, [ ]  = no   General: Weight gain [ ] ; Weight loss [Y ]; Anorexia [ ] ; Fatigue [place] ]; Fever [ ] ; Chills [ ] ; Weakness [ ]   Cardiac: Chest pain/pressure [ ] ; Resting SOB [ ] ; Exertional SOB [ ] ; Orthopnea [ ] ; Pedal Edema [ ] ; Palpitations [ ] ; Syncope [ ] ; Presyncope [ ] ; Paroxysmal nocturnal dyspnea[ ]   Pulmonary: Cough [ ] ; Wheezing[ ] ; Hemoptysis[ ] ; Sputum [ ] ; Snoring [ ]   GI: Vomiting[ ] ; Dysphagia[ ] ; Melena[ ] ; Hematochezia [ ] ; Heartburn[ ] ; Abdominal pain [ ] ; Constipation [ ] ; Diarrhea [ ] ; BRBPR [ ]   GU: Hematuria[ ] ; Dysuria [ ] ; Nocturia[ ]   Vascular: Pain in legs with walking [ ] ; Pain in feet with lying flat [ ] ; Non-healing sores [ ] ; Stroke [ ] ; TIA [ ] ; Slurred speech [ ] ;  Neuro: Headaches[ ] ; Vertigo[ ] ; Seizures[ ] ; Paresthesias[ ] ;Blurred vision [ ] ; Diplopia [ ] ; Vision changes [ ]   Ortho/Skin: Arthritis [ ] ; Joint pain [ Y]; Muscle pain [ ] ; Joint swelling [ ] ; Back Pain [ Y]; Rash [ ]   Psych: Depression[ ] ; Anxiety[ ]   Heme: Bleeding problems [ ] ; Clotting disorders [ ] ; Anemia [ ]   Endocrine: Diabetes [ ] ; Thyroid dysfunction[ ]    Home Medications Prior to Admission medications   Medication Sig Start Date End Date Taking? Authorizing Provider  acetaminophen (TYLENOL) 500  MG tablet Take 1,000 mg by mouth as needed for mild pain.   Yes [provider]  carvedilol (COREG) 6.25 MG tablet TAKE 1 TABLET BY MOUTH TWICE DAILY 09/25/17  Yes Bensimhon, Bevelyn Buckles, MD  DULoxetine (CYMBALTA) 30 MG capsule Take 30 mg by mouth daily.   Yes [provider]  ELIQUIS 5 MG TABS tablet TAKE 1 TABLET BY MOUTH TWICE DAILY 08/28/17  Yes Laurey Morale, MD  gabapentin (NEURONTIN) 300 MG capsule Take 1 capsule (300 mg total) by mouth 2 (two) times daily. 03/24/17  Yes Rai, Ripudeep K, MD  hydrALAZINE (APRESOLINE) 25 MG tablet TAKE 1 TABLET BY MOUTH EVERY 8 HOURS 08/15/17  Yes Laurey Morale, MD  isosorbide mononitrate (IMDUR) 30 MG 24 hr tablet TAKE 1 TABLET BY MOUTH  ONCE DAILY 08/15/17  Yes Laurey Morale, MD  KLOR-CON M20 20 MEQ tablet TAKE 2 TABLETS BY MOUTH DAILY 08/03/17  Yes Laurey Morale, MD  QUEtiapine (SEROQUEL) 50 MG tablet Take 25-50 mg by mouth at bedtime. 10/20/17  Yes [provider]  ranitidine (ZANTAC) 150 MG tablet Take 150 mg by mouth 2 (two) times daily.   Yes [provider]  rosuvastatin (CRESTOR) 40 MG tablet TAKE 1 TABLET BY MOUTH ONCE DAILY 05/23/17  Yes Bensimhon, Bevelyn Buckles, MD  senna-docusate (SENOKOT-S) 8.6-50 MG tablet Take 1 tablet by mouth at bedtime. For constipation 12/01/15  Yes Love, Evlyn Kanner, PA-C  spironolactone (ALDACTONE) 25 MG tablet Take 0.5 tablets (12.5 mg total) by mouth daily. 08/29/17 11/27/17 Yes Laurey Morale, MD  torsemide (DEMADEX) 20 MG tablet Take 2 tablets (40 mg total) by mouth daily. Start on 03/26/17 09/19/17  Yes Laurey Morale, MD    Past Medical History: Past Medical History:  Diagnosis Date  . Anxiety   . Atypical atrial flutter (HCC)   . CHF (congestive heart failure) (HCC)    EF 45%  . CKD (chronic kidney disease), stage III (HCC)    Hattie Perch 03/23/2017  . Complete heart block (HCC) 10/20/2015   in setting of inferior MI, now resolved  . COPD (chronic obstructive pulmonary disease) (HCC)    pt denies this hx on 03/23/2017  . Coronary artery disease   . High cholesterol   . Hypertension   . Myocardial infarction [facility]) 10/2015   "massive one; saw scar tissue of possible previous MI maybe in 2013" (03/23/2017)  . Overweight   . Stenosis of right carotid artery   . TIA (transient ischemic attack) 2016  . Ventricular tachycardia (HCC) 10/2015   in setting of MI, now resolved    Past Surgical History: Past Surgical History:  Procedure Laterality Date  . CARDIAC CATHETERIZATION N/A 10/20/2015   Procedure: Left Heart Cath and Coronary Angiography;  Surgeon: Tonny Bollman, MD;  Location: [facility] INVASIVE CV LAB;  Service: Cardiovascular;  Laterality: N/A;  . CARDIAC  CATHETERIZATION N/A 11/02/2015   Procedure: Right/Left Heart Cath and Coronary/Graft Angiography;  Surgeon: Laurey Morale, MD;  Location: [facility] INVASIVE CV LAB;  Service: Cardiovascular;  Laterality: N/A;  . CARDIOVERSION N/A 05/04/2017   Procedure: CARDIOVERSION;  Surgeon: Laurey Morale, MD;  Location: [facility] ENDOSCOPY;  Service: Cardiovascular;  Laterality: N/A;  . CORONARY ARTERY BYPASS GRAFT N/A 10/27/2015   Procedure: CORONARY ARTERY BYPASS GRAFTING (CABG) x four, using left internal mammary artery and rt leg greater saphenous vein harvested endoscopically;  Surgeon: Kerin Perna, MD;  Location: [facility] OR;  Service: Open Heart Surgery;  Laterality: N/A;  .  ENDARTERECTOMY Right 10/27/2015   Procedure: RIGHT ENDARTERECTOMY CAROTID with patch angioplasty using Xenosure bovine pericardium patch;  Surgeon: Fransisco Hertz, MD;  Location: [facility] OR;  Service: Vascular;  Laterality: Right;  . EYE MUSCLE SURGERY Left 1970s   "lazy eye"  . LACERATION REPAIR Right 2003   RUE; "fell thru plate-glass window"  . TEE WITHOUT CARDIOVERSION N/A 10/27/2015   Procedure: TRANSESOPHAGEAL ECHOCARDIOGRAM (TEE);  Surgeon: Kerin Perna, MD;  Location: [facility] OR;  Service: Open Heart Surgery;  Laterality: N/A;    Family History: Family History  Problem Relation Age of Onset  . Heart disease Mother   . Failure to thrive Father   . Congestive Heart Failure Father        Deceased  . Breast cancer Sister        Living    Social History: Social History   Socioeconomic History  . Marital status: Single    Spouse name: Not on file  . Number of children: Not on file  . Years of education: Not on file  . Highest education level: Not on file  Occupational History  . Not on file  Social Needs  . Financial resource strain: Not on file  . Food insecurity:    Worry: Not on file    Inability: Not on file  . Transportation needs:    Medical: Not on file    Non-medical: Not on file  Tobacco Use  . Smoking status: Former  Smoker    Packs/day: 1.00    Years: 23.00    Pack years: 23.00    Types: Cigarettes    Last attempt to quit: 2004    Years since quitting: 15.2  . Smokeless tobacco: Never Used  Substance and Sexual Activity  . Alcohol use: No    Alcohol/week: 0.0 oz    Comment: Previously drank 24 beer/daily for 27 years, but has been sober since January 08, 2002  . Drug use: Yes    Types: Marijuana    Comment: 03/23/2017 "daily"  . Sexual activity: Yes  Lifestyle  . Physical activity:    Days per week: Not on file    Minutes per session: Not on file  . Stress: Not on file  Relationships  . Social connections:    Talks on phone: Not on file    Gets together: Not on file    Attends religious service: Not on file    Active member of club or organization: Not on file    Attends meetings of clubs or organizations: Not on file    Relationship status: Not on file  Other Topics Concern  . Not on file  Social History Narrative   Lives with girlfriend in an apartment on the second floor.     Works as a Teaching laboratory technician for apartment complex.     Has no children.  Education: 10th grade.    Allergies:  Allergies  Allergen Reactions  . Losartan Shortness Of Breath  . Entresto [Sacubitril-Valsartan]     Kidney function  . Elita Quick [Ceftazidime] Rash    Rash with blisters  . Lisinopril Rash    Objective:    Vital Signs:   Pulse Rate:  [53-61] 61 (04/05 1015) Resp:  [13-15] 15 (04/05 1015) BP: (117-143)/(83-95) 134/83 (04/05 1015) SpO2:  [96 %-100 %] 100 % (04/05 1015) Weight:  [215 lb 6.4 oz (97.7 kg)] 215 lb 6.4 oz (97.7 kg) (04/05 0838)   Filed Weights   11/10/17 0838  Weight: 215  lb 6.4 oz (97.7 kg)     Physical Exam     General:  No respiratory difficulty. In bed.  HEENT: Normal Neck: Supple. no JVD. Carotids 2+ bilat; no bruits. No lymphadenopathy or thyromegaly appreciated. Cor: PMI nondisplaced. Regular rate & rhythm. No rubs, gallops or murmurs. Lungs: Clear Abdomen:  Soft, nontender, nondistended. No hepatosplenomegaly. No bruits or masses. Good bowel sounds. Extremities: No cyanosis, clubbing, rash, edema Neuro: Alert & oriented x 3, cranial nerves grossly intact. moves all 4 extremities w/o difficulty. Affect pleasant.   Telemetry   A flutter 50s   EKG  A flutter   Labs     Basic Metabolic Panel: No results for input(s): NA, K, CL, CO2, GLUCOSE, BUN, CREATININE, CALCIUM, MG, PHOS in the last 168 hours.  Liver Function Tests: No results for input(s): AST, ALT, ALKPHOS, BILITOT, PROT, ALBUMIN in the last 168 hours. No results for input(s): LIPASE, AMYLASE in the last 168 hours. No results for input(s): AMMONIA in the last 168 hours.  CBC: Recent Labs  Lab 11/10/17 0905  WBC 9.6  NEUTROABS 7.7  HGB 14.2  HCT 43.5  MCV 89.5  PLT 201    Cardiac Enzymes: No results for input(s): CKTOTAL, CKMB, CKMBINDEX, TROPONINI in the last 168 hours.  BNP: BNP (last 3 results) Recent Labs    12/06/16 1429 02/06/17 1136 03/28/17 1137  BNP 309.3* 330.6* 1,836.9*    ProBNP (last 3 results) No results for input(s): PROBNP in the last 8760 hours.   CBG: No results for input(s): GLUCAP in the last 168 hours.  Coagulation Studies: No results for input(s): LABPROT, INR in the last 72 hours.  Imaging: Ct Head Wo Contrast  Result Date: 11/10/2017 CLINICAL DATA:  Syncope EXAM: CT HEAD WITHOUT CONTRAST TECHNIQUE: Contiguous axial images were obtained from the base of the skull through the vertex without intravenous contrast. COMPARISON:  Head CT November 23, 2014 and brain MRI November 23, 2014 FINDINGS: Brain: Ventricles are normal in size and configuration. There is no intracranial mass, hemorrhage, extra-axial fluid collection, or midline shift. Patchy small vessel disease in the centra semiovale appears stable. No new gray-white compartment lesions are evident. No acute infarct is appreciable. Vascular: No hyperdense vessels are evident. There is  calcification in each carotid siphon region. Skull: Bony calvarium appears intact. Sinuses/Orbits: There is opacification with retention cysts in both maxillary antra, more on the left than on the right. There is mucosal thickening in multiple ethmoid air cells. Other paranasal sinuses are clear. Orbits appear symmetric bilaterally. Other: Mastoid air cells are clear. IMPRESSION: Patchy supratentorial small vessel disease. No acute infarct. No mass or hemorrhage. Foci of arterial vascular calcification noted. There are areas of paranasal sinus disease. Electronically Signed   By: Bretta Bang III M.D.   On: 11/10/2017 09:37   Dg Chest Port 1 View  Result Date: 11/10/2017 CLINICAL DATA:  Syncope EXAM: PORTABLE CHEST 1 VIEW COMPARISON:  April 02, 2017 FINDINGS: There is scarring in the left base. There is no edema or consolidation. Heart is mildly enlarged with pulmonary vascularity within normal limits. Patient is status post coronary artery bypass grafting. No adenopathy. No bone lesions. No pneumothorax. IMPRESSION: Left base scarring. No edema or consolidation. Stable cardiac prominence. Electronically Signed   By: Bretta Bang III M.D.   On: 11/10/2017 09:20      Patient Profile   Christopher [name] is 57 year old with a history CAD s/p CABG x4, ICM. CKD, A flutter, PAD, carotid stenosis  RCEA, Chronic systolic heart failure.  Admitted with syncope and A Flutter Assessment/Plan  1. Syncope Consult EP. May need Linq?  Repeat ECHO.  2. A flutter Continue eliquis 5 mg twice a day. Hold carvedilol with bradycardia. DC-CV today.  3. Chronic Systolic Heart Failure ECHO EF 40-45%.  Continue current home HF meds. Except hold bb for now.  4. CAD S/P CABG x4 . No s/s ischemia.  Continue statin.   Admit for observation.   Tonye Becket, NP 11/10/2017, 11:59 AM  Advanced Heart Failure Team Pager [phone] (M-F; 7a - 4p)  Please contact [facility] for night-coverage after hours (4p -7a ) and  weekends on [email or web address]  Patient seen with NP, agree with the above note.    He had been doing well recently, minimal dyspnea, no chest pain, exercising regularly.  Today, he was walking in his house and developed lightheadededness then passed out and fell to the ground.  He was observed by his wife, was out for < 30 seconds.   ECG showed recurrent atrial flutter with HR 50s-60s.     On exam, he is not volume overloaded, no JVD or edema. Regular S1S2, no m.  CTAB.   1. Syncope: Patient is now in relatively slow atrial flutter.  I am concerned that he may have had a bradyarrhythmia, ?sick sinus syndrome.   - Monitor overnight on telemetry and hold Coreg for now.  - I think he will need a LINQ monitor if nothing seen on telemetry.  Will ask EP to see him.  - I will get a repeat echo, most recent prior showed EF 40-45%, out of ICD range.  2. Atrial flutter: Recurrent, HR in 50s-60s.  He has had in the past, offered ablation but wanted to hold off.  - He has not missed Eliquis doses, will plan DCCV today.  Risks/benefits discussed with patient and he agrees to proceed.  - He is now interested in flutter ablation.  Will have him followup with Dr. Johney Frame after discharge.  3. Chronic systolic CHF: Most recent echo with EF 40-45%, ischemic cardiomyopathy.  He is not volume overloaded on exam, NYHA class II symptoms.   - Continue hydralazine/Imdur (on these with CKD, but think reasonable to switch to [facility] in future as creatinine has been better recently, still awaiting today's creatinine).  - Continue spironolactone.  - Will hold Coreg for now given relatively slow HR.  Probably restart at discharge.  5. CAD: S/p CABG.  Continue statin.  No ASA as on Eliquis.   [name] 11/10/2017 12:32 PM

## 2017-11-10 NOTE — ED Provider Notes (Addendum)
[facility] 3E CHF Provider Note   CSN: [id number] Arrival date & time: 11/10/17  5621     History   Chief Complaint Chief Complaint  Patient presents with  . Atrial Flutter    HPI Christopher [name] is a 57 y.o. male.  HPI  The patient is a 57 year old male, he has a known history of atrial flutter, congestive heart failure with an ejection fraction of 45%, complete heart block which resolved after having a myocardial infarction.  He has had a quadruple bypass 2 years ago and is done very well since that time.  The patient reports that he is currently taking Eliquis as an anticoagulant, he has been in his usual state of health (not in atrial flutter after being cardioverted in October), until yesterday morning when he developed a feeling of fatigue which she states is similar to when he had atrial flutter in the past.  He was able to perform normal activities yesterday including mowing the yard however this morning when he got up to use the bathroom and then walked into the kitchen he had a syncopal episode which was witnessed by his spouse, evidently the patient had no prodromal symptoms that he remembers but fell directly back into the fridge and was unconscious for approximately 30 seconds after which time he gradually came around.  His wife states that during that time he was severely pale, diaphoretic, there is no seizure activity or vomiting.  The patient denies any symptoms at this time including chest pain shortness of breath headache neck pain numbness weakness back pain swelling of the legs rashes dysuria diarrhea.  He has had a normal appetite.  He feels like he is in atrial flutter at this time with some palpitations in his chest.  The symptoms were acute in onset, occurred just prior to arrival, his syncope has now completely resolved.  Past Medical History:  Diagnosis Date  . Anxiety   . Atypical atrial flutter (HCC)   . CHF (congestive heart failure)  (HCC)    EF 45%  . CKD (chronic kidney disease), stage III (HCC)    Hattie Perch 03/23/2017  . Complete heart block (HCC) 10/20/2015   in setting of inferior MI, now resolved  . COPD (chronic obstructive pulmonary disease) (HCC)    pt denies this hx on 03/23/2017  . Coronary artery disease   . High cholesterol   . Hypertension   . Myocardial infarction [facility]) 10/2015   "massive one; saw scar tissue of possible previous MI maybe in 2013" (03/23/2017)  . Overweight   . Stenosis of right carotid artery   . TIA (transient ischemic attack) 2016  . Ventricular tachycardia (HCC) 10/2015   in setting of MI, now resolved    Patient Active Problem List   Diagnosis Date Noted  . Atrial flutter with controlled response (HCC) 11/10/2017  . Syncope and collapse   . Atrial flutter with rapid ventricular response (HCC) 04/02/2017  . CKD (chronic kidney disease) stage 3, GFR 30-59 ml/min (HCC) 04/02/2017  . Hypertensive emergency   . Chronic systolic CHF (congestive heart failure) (HCC) 02/07/2016  . Hypokalemia 12/01/2015  . Debility 11/24/2015  . Coronary artery disease involving native coronary artery of native heart without angina pectoris   . VT (ventricular tachycardia) (HCC)   . Ileus (HCC)   . Hyperosmolality and/or hypernatremia   . Chronic obstructive pulmonary disease (HCC)   . Ventilator dependence (HCC)   . Tracheostomy status (HCC)   . S/P  CABG x 4 10/27/2015  . CAD (coronary artery disease)   . Hypertensive heart disease with heart failure (HCC)   . Carotid artery disease (HCC)   . Complete heart block (HCC) 10/20/2015  . TIA (transient ischemic attack) 12/25/2014  . Hyperlipidemia 12/25/2014    Past Surgical History:  Procedure Laterality Date  . CARDIAC CATHETERIZATION N/A 10/20/2015   Procedure: Left Heart Cath and Coronary Angiography;  Surgeon: Tonny Bollman, MD;  Location: [facility] INVASIVE CV LAB;  Service: Cardiovascular;  Laterality: N/A;  . CARDIAC CATHETERIZATION N/A  11/02/2015   Procedure: Right/Left Heart Cath and Coronary/Graft Angiography;  Surgeon: Laurey Morale, MD;  Location: [facility] INVASIVE CV LAB;  Service: Cardiovascular;  Laterality: N/A;  . CARDIOVERSION N/A 05/04/2017   Procedure: CARDIOVERSION;  Surgeon: Laurey Morale, MD;  Location: [facility] ENDOSCOPY;  Service: Cardiovascular;  Laterality: N/A;  . CARDIOVERSION  11/10/2017  . CARDIOVERSION N/A 11/10/2017   Procedure: CARDIOVERSION;  Surgeon: Laurey Morale, MD;  Location: [facility] ENDOSCOPY;  Service: Cardiovascular;  Laterality: N/A;  . CORONARY ARTERY BYPASS GRAFT N/A 10/27/2015   Procedure: CORONARY ARTERY BYPASS GRAFTING (CABG) x four, using left internal mammary artery and rt leg greater saphenous vein harvested endoscopically;  Surgeon: Kerin Perna, MD;  Location: [facility] OR;  Service: Open Heart Surgery;  Laterality: N/A;  . ENDARTERECTOMY Right 10/27/2015   Procedure: RIGHT ENDARTERECTOMY CAROTID with patch angioplasty using Xenosure bovine pericardium patch;  Surgeon: Fransisco Hertz, MD;  Location: [facility] OR;  Service: Vascular;  Laterality: Right;  . EYE MUSCLE SURGERY Left 1970s   "lazy eye"  . LACERATION REPAIR Right 2003   RUE; "fell thru plate-glass window"  . LOOP RECORDER INSERTION  11/10/2017  . LOOP RECORDER INSERTION N/A 11/10/2017   Procedure: LOOP RECORDER INSERTION;  Surgeon: Regan Lemming, MD;  Location: [facility] INVASIVE CV LAB;  Service: Cardiovascular;  Laterality: N/A;  . TEE WITHOUT CARDIOVERSION N/A 10/27/2015   Procedure: TRANSESOPHAGEAL ECHOCARDIOGRAM (TEE);  Surgeon: Kerin Perna, MD;  Location: [facility] OR;  Service: Open Heart Surgery;  Laterality: N/A;        Home Medications    Prior to Admission medications   Medication Sig Start Date End Date Taking? Authorizing Provider  acetaminophen (TYLENOL) 500 MG tablet Take 1,000 mg by mouth as needed for mild pain.   Yes [provider]  DULoxetine (CYMBALTA) 30 MG capsule Take 30 mg by mouth daily.   Yes [provider]  ELIQUIS 5 MG TABS tablet TAKE 1 TABLET BY MOUTH TWICE DAILY 08/28/17  Yes Laurey Morale, MD  gabapentin (NEURONTIN) 300 MG capsule Take 1 capsule (300 mg total) by mouth 2 (two) times daily. 03/24/17  Yes Rai, Ripudeep K, MD  hydrALAZINE (APRESOLINE) 25 MG tablet TAKE 1 TABLET BY MOUTH EVERY 8 HOURS 08/15/17  Yes Laurey Morale, MD  isosorbide mononitrate (IMDUR) 30 MG 24 hr tablet TAKE 1 TABLET BY MOUTH ONCE DAILY 08/15/17  Yes Laurey Morale, MD  KLOR-CON M20 20 MEQ tablet TAKE 2 TABLETS BY MOUTH DAILY 08/03/17  Yes Laurey Morale, MD  QUEtiapine (SEROQUEL) 50 MG tablet Take 25-50 mg by mouth at bedtime. 10/20/17  Yes [provider]  ranitidine (ZANTAC) 150 MG tablet Take 150 mg by mouth 2 (two) times daily.   Yes [provider]  rosuvastatin (CRESTOR) 40 MG tablet TAKE 1 TABLET BY MOUTH ONCE DAILY 05/23/17  Yes Bensimhon, Bevelyn Buckles, MD  senna-docusate (SENOKOT-S) 8.6-50 MG tablet Take 1 tablet by  mouth at bedtime. For constipation 12/01/15  Yes Love, Evlyn Kanner, PA-C  spironolactone (ALDACTONE) 25 MG tablet Take 0.5 tablets (12.5 mg total) by mouth daily. 08/29/17 11/27/17 Yes Laurey Morale, MD  torsemide (DEMADEX) 20 MG tablet Take 2 tablets (40 mg total) by mouth daily. Start on 03/26/17 09/19/17  Yes Laurey Morale, MD    Family History Family History  Problem Relation Age of Onset  . Heart disease Mother   . Failure to thrive Father   . Congestive Heart Failure Father        Deceased  . Breast cancer Sister        Living    Social History Social History   Tobacco Use  . Smoking status: Former Smoker    Packs/day: 1.00    Years: 23.00    Pack years: 23.00    Types: Cigarettes    Last attempt to quit: 2004    Years since quitting: 15.2  . Smokeless tobacco: Never Used  Substance Use Topics  . Alcohol use: No    Alcohol/week: 0.0 oz    Comment: Previously drank 24 beer/daily for 27 years, but has been sober since January 08, 2002  . Drug  use: Yes    Types: Marijuana    Comment: 03/23/2017 "daily"     Allergies   Losartan; Entresto [sacubitril-valsartan]; Fortaz [ceftazidime]; and Lisinopril   Review of Systems Review of Systems  All other systems reviewed and are negative.    Physical Exam Updated Vital Signs BP (!) 139/91 (BP Location: Right Arm)   Pulse 76   Temp 98.1 F (36.7 C) (Oral)   Resp 18   Ht 5\' 10"  (1.778 m)   Wt 105.2 kg (231 lb 14.4 oz)   SpO2 97%   BMI 33.27 kg/m   Physical Exam  Constitutional: He appears well-developed and well-nourished. No distress.  HENT:  Head: Normocephalic and atraumatic.  Mouth/Throat: Oropharynx is clear and moist. No oropharyngeal exudate.  Eyes: Pupils are equal, round, and reactive to light. Conjunctivae and EOM are normal. Right eye exhibits no discharge. Left eye exhibits no discharge. No scleral icterus.  Neck: Normal range of motion. Neck supple. No JVD present. No thyromegaly present.  Cardiovascular: Normal rate, regular rhythm, normal heart sounds and intact distal pulses. Exam reveals no gallop and no friction rub.  No murmur heard. Normal pulses at the radial arteries bilaterally, no edema, no JVD  Pulmonary/Chest: Effort normal and breath sounds normal. No respiratory distress. He has no wheezes. He has no rales.  Sternotomy scar, well-healed  Abdominal: Soft. Bowel sounds are normal. He exhibits no distension and no mass. There is no tenderness.  Soft and nontender  Musculoskeletal: Normal range of motion. He exhibits no edema or tenderness.  Lymphadenopathy:    He has no cervical adenopathy.  Neurological: He is alert. Coordination normal.  Speech is clear, cranial nerves III through XII are intact, memory is intact, strength is normal in all 4 extremities including grips, sensation is intact to light touch and pinprick in all 4 extremities. Coordination as tested by finger-nose-finger is normal, no limb ataxia. Normal gait, normal reflexes at the  patellar tendons bilaterally  Skin: Skin is warm and dry. No rash noted. No erythema.  Psychiatric: He has a normal mood and affect. His behavior is normal.  Nursing note and vitals reviewed.    ED Treatments / Results  Labs (all labs ordered are listed, but only abnormal results are displayed) Labs Reviewed  COMPREHENSIVE METABOLIC PANEL - Abnormal; Notable for the following components:      Result Value   Glucose, Bld 142 (*)    Creatinine, Ser 1.31 (*)    GFR calc non Af Amer 59 (*)    All other components within normal limits  TROPONIN I - Abnormal; Notable for the following components:   Troponin I 0.04 (*)    All other components within normal limits  URINALYSIS, ROUTINE W REFLEX MICROSCOPIC - Abnormal; Notable for the following components:   Color, Urine STRAW (*)    Specific Gravity, Urine 1.004 (*)    All other components within normal limits  BASIC METABOLIC PANEL - Abnormal; Notable for the following components:   Creatinine, Ser 1.38 (*)    GFR calc non Af Amer 56 (*)    All other components within normal limits  CBC WITH DIFFERENTIAL/PLATELET  MAGNESIUM    EKG EKG Interpretation  Date/Time:  Friday November 10 2017 08:34:09 EDT Ventricular Rate:  61 PR Interval:    QRS Duration: 216 QT Interval:  452 QTC Calculation: 456 R Axis:   -57 Text Interpretation:  Atrial flutter with varied AV block, Nonspecific IVCD with LAD LVH with secondary repolarization abnormality Inferior infarct, old Anterolateral infarct, age indeterminate Since last tracing Atrial flutter now present (returned) Confirmed by Eber Hong ([id number]) on 11/10/2017 10:06:48 AM   Radiology No results found.  Procedures .Critical Care Performed by: Eber Hong, MD Authorized by: Eber Hong, MD   Critical care provider statement:    Critical care time (minutes):  35   Critical care time was exclusive of:  Separately billable procedures and treating other patients and teaching time    Critical care was necessary to treat or prevent imminent or life-threatening deterioration of the following conditions:  Cardiac failure   Critical care was time spent personally by me on the following activities:  Blood draw for specimens, development of treatment plan with patient or surrogate, discussions with consultants, evaluation of patient's response to treatment, examination of patient, obtaining history from patient or surrogate, ordering and performing treatments and interventions, ordering and review of laboratory studies, ordering and review of radiographic studies, pulse oximetry, re-evaluation of patient's condition and review of old charts   (including critical care time)  Medications Ordered in ED Medications  perflutren lipid microspheres (DEFINITY) IV suspension ( Intravenous MAR Unhold 11/10/17 1726)     Initial Impression / Assessment and Plan / ED Course  I have reviewed the triage vital signs and the nursing notes.  Pertinent labs & imaging results that were available during my care of the patient were reviewed by me and considered in my medical decision making (see chart for details).     The EKG shows atrial flutter with 4-1 block, rate is 61, no signs of ST elevation.  At this point we will discuss the case with cardiologist to let them know about the patient, Dr. Sherlie Ban is the primary cardiologist.  Cardiac monitoring, labs, chest x-ray, the patient does not have any headache or neurologic abnormalities however given no prodromal symptoms we will also obtain CT.  Patient is in agreement.  Likely needs inpatient observation for cardiac monitoring given the history of congestive heart failure arrhythmia and prior myocardial infarction.  I discussed the patient's care with the cardiology team, Trish, they agree to admit the patient to the hospital.  The patient has had several episodes of bradycardia going down into the low 40s, he does become diaphoretic during these  episodes.  The CBC is unremarkable, metabolic panel with mild renal insufficiency and the troponin is elevated at 0.04, patient will be admitted by the cardiology service  She did have multiple episodes of bradycardia, he did not require any temporary pacing, the concern of his elevated troponin brought in ischemia as a possibility.  The patient required continuous cardiac monitoring and multiple re-evaluations.  Critical care provided  Final Clinical Impressions(s) / ED Diagnoses   Final diagnoses:  Syncope and collapse  Bradycardia  Atrial flutter with controlled response (HCC)      Eber Hong, MD 11/10/17 1528    Eber Hong, MD 11/20/17 1225

## 2017-11-10 NOTE — Anesthesia Postprocedure Evaluation (Signed)
Anesthesia Post Note  Patient: Christopher [name]  Procedure(s) Performed: CARDIOVERSION (N/A )     Patient location during evaluation: PACU Anesthesia Type: General Level of consciousness: awake and alert Pain management: pain level controlled Vital Signs Assessment: post-procedure vital signs reviewed and stable Respiratory status: spontaneous breathing, nonlabored ventilation, respiratory function stable and patient connected to nasal cannula oxygen Cardiovascular status: blood pressure returned to baseline and stable Postop Assessment: no apparent nausea or vomiting Anesthetic complications: no    Last Vitals:  Vitals:   11/10/17 1530 11/10/17 1545  BP: (!) 147/88   Pulse: 64 65  Resp: 15 13  Temp:    SpO2: 99% 100%    Last Pain:  Vitals:   11/10/17 1410  TempSrc:   PainSc: 0-No pain                 Steel Kerney EDWARD

## 2017-11-11 DIAGNOSIS — R55 Syncope and collapse: Secondary | ICD-10-CM | POA: Diagnosis not present

## 2017-11-11 LAB — BASIC METABOLIC PANEL
Anion gap: 9 (ref 5–15)
BUN: 15 mg/dL (ref 6–20)
CHLORIDE: 104 mmol/L (ref 101–111)
CO2: 28 mmol/L (ref 22–32)
CREATININE: 1.38 mg/dL — AB (ref 0.61–1.24)
Calcium: 9.2 mg/dL (ref 8.9–10.3)
GFR, EST NON AFRICAN AMERICAN: 56 mL/min — AB (ref >60)
Glucose, Bld: 92 mg/dL (ref 65–99)
Potassium: 4 mmol/L (ref 3.5–5.1)
SODIUM: 141 mmol/L (ref 135–145)

## 2017-11-11 NOTE — Discharge Instructions (Signed)
***  PLEASE REMEMBER TO BRING ALL OF YOUR MEDICATIONS TO EACH OF YOUR FOLLOW-UP OFFICE VISITS.  

## 2017-11-11 NOTE — Progress Notes (Signed)
Per CCMD patient HR went down to 38 bpm and 2nd degree AVB, immediatly after went back to normal.Patient asleep,asymptomatic, unable to do EKG.  Will continue to monitor.  Akisha Sturgill, RN

## 2017-11-11 NOTE — Progress Notes (Signed)
Call placed to CCMD to notify of telemetry monitoring d/c.   

## 2017-11-11 NOTE — Progress Notes (Signed)
Patient resting comfortably during shift report. Denies complaints.  

## 2017-11-11 NOTE — Progress Notes (Signed)
Pt discharged with belongings, AVS in patient's possession. No complaints upon discharge. Escorted off the floor via wheelchair, by [employer].

## 2017-11-11 NOTE — Discharge Summary (Addendum)
Discharge Summary    Patient ID: Christopher [name],  MRN: [id number], DOB/AGE: 1961/03/26 57 y.o.  Admit date: 11/10/2017 Discharge date: 11/11/2017  Primary Care Provider: Shirlean Mylar Primary Cardiologist: [name], MD  Discharge Diagnoses    Principal Problem:   Syncope and collapse  **s/p MDT Linq this admission. Active Problems:   Atrial flutter with controlled response (HCC)   CAD (coronary artery disease)   Hypertensive heart disease with heart failure (HCC)   S/P CABG x 4   Chronic obstructive pulmonary disease (HCC)   Chronic systolic CHF (congestive heart failure) (HCC)   CKD (chronic kidney disease) stage 3, GFR 30-59 ml/min (HCC)   Hyperlipidemia   Carotid artery disease (HCC)   Diagnostic Studies/Procedures    4.5.2019 Medtronic Reveal [place] model [id number] S implantable loop recorder placed. _____________   History of Present Illness     57 y/o ? with a h/o CAD s/p CABG, HFrEF, ICM, HTN, HL, CKD, carotid dzs s/p R CEA, and PAFlutter on eliquis.  He was in his usoh until 4/5, when he suffered a witnessed syncopal episode and was subsequently found to be in atrial flutter with relatively slow ventricular response and rates in the 50's.  He was successfully cardioverted in the emergency department and admitted for further evaluation.  Hospital Course     Consultants: Electrophysiology   Following admission,  blocker therapy was held and EP was consulted due to concern for bradyarrhythmia as a cause for his syncope.  Decision was made to place a Medtronic Reveal Linq implantable loop recorder and Mr. Sites tolerated implantation well.  Discussion was also held regarding pursuit of atrial flutter ablation and he will follow-up with Dr. Elberta Fortis in the outpatient setting to discuss further.  [name] has been ambulating without difficulty or recurrent presyncope/syncope this AM and will be discharged home today in good condition.  He will f/u in  device clinic in approximately 10 days. _____________ Physical Exam   Discharge Vitals Blood pressure (!) 139/91, pulse 76, temperature 98.1 F (36.7 C), temperature source Oral, resp. rate 18, height 5\' 10"  (1.778 m), weight 231 lb 14.4 oz (105.2 kg), SpO2 97 %.  Filed Weights   11/10/17 1318 11/10/17 1752 11/11/17 0538  Weight: 215 lb 6.4 oz (97.7 kg) 213 lb (96.6 kg) 231 lb 14.4 oz (105.2 kg)    GEN: Well nourished, well developed, in no acute distress.  HEENT: Grossly normal.  Neck: Supple, no JVD, carotid bruits, or masses. Cardiac: RRR, no murmurs, rubs, or gallops. No clubbing, cyanosis, edema.  Radials/DP/PT 2+ and equal bilaterally.  Respiratory:  Respirations regular and unlabored, clear to auscultation bilaterally. GI: Soft, nontender, nondistended, BS + x 4. MS: no deformity or atrophy. Skin: warm and dry, no rash. Neuro:  Strength and sensation are intact. Psych: AAOx3.  Normal affect.  Labs & Radiologic Studies    CBC Recent Labs    11/10/17 0905  WBC 9.6  NEUTROABS 7.7  HGB 14.2  HCT 43.5  MCV 89.5  PLT 201   Basic Metabolic Panel Recent Labs    95/62/13 0905 11/11/17 0033  NA 140 141  K 4.2 4.0  CL 103 104  CO2 24 28  GLUCOSE 142* 92  BUN 12 15  CREATININE 1.31* 1.38*  CALCIUM 9.4 9.2  MG 2.2  --    Liver Function Tests Recent Labs    11/10/17 0905  AST 26  ALT 19  ALKPHOS 118  BILITOT 0.9  PROT 7.2  ALBUMIN 4.1   No results for input(s): LIPASE, AMYLASE in the last 72 hours. Cardiac Enzymes Recent Labs    11/10/17 0905  TROPONINI 0.04*  _____________  Ct Head Wo Contrast  Result Date: 11/10/2017 CLINICAL DATA:  Syncope EXAM: CT HEAD WITHOUT CONTRAST TECHNIQUE: Contiguous axial images were obtained from the base of the skull through the vertex without intravenous contrast. COMPARISON:  Head CT November 23, 2014 and brain MRI November 23, 2014 FINDINGS: Brain: Ventricles are normal in size and configuration. There is no intracranial mass,  hemorrhage, extra-axial fluid collection, or midline shift. Patchy small vessel disease in the centra semiovale appears stable. No new gray-white compartment lesions are evident. No acute infarct is appreciable. Vascular: No hyperdense vessels are evident. There is calcification in each carotid siphon region. Skull: Bony calvarium appears intact. Sinuses/Orbits: There is opacification with retention cysts in both maxillary antra, more on the left than on the right. There is mucosal thickening in multiple ethmoid air cells. Other paranasal sinuses are clear. Orbits appear symmetric bilaterally. Other: Mastoid air cells are clear. IMPRESSION: Patchy supratentorial small vessel disease. No acute infarct. No mass or hemorrhage. Foci of arterial vascular calcification noted. There are areas of paranasal sinus disease. Electronically Signed   By: Bretta Bang III M.D.   On: 11/10/2017 09:37   Dg Chest Port 1 View  Result Date: 11/10/2017 CLINICAL DATA:  Syncope EXAM: PORTABLE CHEST 1 VIEW COMPARISON:  April 02, 2017 FINDINGS: There is scarring in the left base. There is no edema or consolidation. Heart is mildly enlarged with pulmonary vascularity within normal limits. Patient is status post coronary artery bypass grafting. No adenopathy. No bone lesions. No pneumothorax. IMPRESSION: Left base scarring. No edema or consolidation. Stable cardiac prominence. Electronically Signed   By: Bretta Bang III M.D.   On: 11/10/2017 09:20   Disposition   Pt is being discharged home today in good condition.  Follow-up Plans & Appointments   Follow-up Information    [facility] Follow up in 2 week(s).   Specialty:  Cardiology Why:  we will contact you for follow-up related to a wound and device check w/in the next 10-14 days. Contact information: [address], [address] [place] [address] [phone]       Regan Lemming, MD Follow up.   Specialty:   Cardiology Why:  We will contact you for follow-up with Dr. Elberta Fortis regarding atrial flutter ablation. Contact information: [address] [place] [address] [phone]        Laurey Morale, MD Follow up on 11/24/2017.   Specialty:  Cardiology Why:  9:20 AM Contact information: [address] [place] [address] [phone]            Discharge Medications   Allergies as of 11/11/2017      Reactions   Losartan Shortness Of Breath   Entresto [sacubitril-valsartan]    Kidney function   Elita Quick [ceftazidime] Rash   Rash with blisters   Lisinopril Rash      Medication List    STOP taking these medications   carvedilol 6.25 MG tablet Commonly known as:  COREG     TAKE these medications   acetaminophen 500 MG tablet Commonly known as:  TYLENOL Take 1,000 mg by mouth as needed for mild pain.   DULoxetine 30 MG capsule Commonly known as:  CYMBALTA Take 30 mg by mouth daily.   ELIQUIS 5  MG Tabs tablet Generic drug:  apixaban TAKE 1 TABLET BY MOUTH TWICE DAILY   gabapentin 300 MG capsule Commonly known as:  NEURONTIN Take 1 capsule (300 mg total) by mouth 2 (two) times daily.   hydrALAZINE 25 MG tablet Commonly known as:  APRESOLINE TAKE 1 TABLET BY MOUTH EVERY 8 HOURS   isosorbide mononitrate 30 MG 24 hr tablet Commonly known as:  IMDUR TAKE 1 TABLET BY MOUTH ONCE DAILY   KLOR-CON M20 20 MEQ tablet Generic drug:  potassium chloride SA TAKE 2 TABLETS BY MOUTH DAILY   QUEtiapine 50 MG tablet Commonly known as:  SEROQUEL Take 25-50 mg by mouth at bedtime.   ranitidine 150 MG tablet Commonly known as:  ZANTAC Take 150 mg by mouth 2 (two) times daily.   rosuvastatin 40 MG tablet Commonly known as:  CRESTOR TAKE 1 TABLET BY MOUTH ONCE DAILY   senna-docusate 8.6-50 MG tablet Commonly known as:  Senokot-S Take 1 tablet by mouth at bedtime. For constipation   spironolactone 25 MG tablet Commonly known as:   ALDACTONE Take 0.5 tablets (12.5 mg total) by mouth daily.   torsemide 20 MG tablet Commonly known as:  DEMADEX Take 2 tablets (40 mg total) by mouth daily. Start on 03/26/17        Outstanding Labs/Studies   None  Duration of Discharge Encounter   Greater than 30 minutes including physician time.  Signed, Nicolasa Ducking NP 11/11/2017, 11:36 AM  I have seen and examined this patient with Nicolasa Ducking.  Agree with above, note added to reflect my findings.  On exam, RRR, no murmurs, lungs clear.  Then to the hospital after an episode of syncope.  Was also found to be in atrial flutter.  Had a cardioversion and is now in sinus rhythm.  Had a Linq monitor implanted for syncope.  Plan for discharge today with follow-up in clinic.  Will M. Camnitz MD 11/11/2017 11:46 AM

## 2017-11-11 NOTE — Care Management Note (Signed)
Case Management Note  Patient Details  Name: Christopher [name] MRN: [id number] Date of Birth: Sep 27, 1960  Subjective/Objective:   Pt presented for Atrial Flutter. PTA Independent from home with family support. Pt has hospital follow up scheduled.                   Action/Plan: No home needs identified at this time.   Expected Discharge Date:  11/11/17               Expected Discharge Plan:  Home/Self Care  In-House Referral:  NA  Discharge planning Services  CM Consult  Post Acute Care Choice:  NA Choice offered to:  NA  DME Arranged:  N/A DME Agency:  NA  HH Arranged:  NA HH Agency:  NA  Status of Service:  Completed, signed off  If discussed at Long Length of Stay Meetings, dates discussed:    Additional Comments:  Gala Lewandowsky, RN 11/11/2017, 12:11 PM

## 2017-11-12 ENCOUNTER — Encounter (HOSPITAL_COMMUNITY): Payer: Self-pay | Admitting: Cardiology

## 2017-11-13 ENCOUNTER — Encounter (HOSPITAL_COMMUNITY): Payer: Self-pay | Admitting: Cardiology

## 2017-11-14 ENCOUNTER — Telehealth: Payer: Self-pay | Admitting: Cardiology

## 2017-11-14 NOTE — Telephone Encounter (Signed)
Wife concerned about activity restrictions after LINQ placement on 11/10/17. I advised that he doesn't have activity restrictions with the loop recorder, whatever he is comfortable doing. I advised that they leave the steri-strips in place and that we would remove them at the wound check appt 11/21/17 and that he would need to avoid getting them wet. She verbalizes understanding of instructions.

## 2017-11-14 NOTE — Telephone Encounter (Signed)
Patient wife calling in regards to loop recorder placed on 11-10-17. Wound check scheduled for 11-21-17 at 2:30 PM.

## 2017-11-21 ENCOUNTER — Ambulatory Visit (INDEPENDENT_AMBULATORY_CARE_PROVIDER_SITE_OTHER): Payer: Self-pay | Admitting: *Deleted

## 2017-11-21 DIAGNOSIS — R55 Syncope and collapse: Secondary | ICD-10-CM

## 2017-11-21 LAB — CUP PACEART INCLINIC DEVICE CHECK
MDC IDC PG IMPLANT DT: 20190405
MDC IDC SESS DTM: 20190416154950

## 2017-11-21 NOTE — Progress Notes (Signed)
Wound Loop check in clinic. Steri-Strips removed, incision edges approximated, wound well healed.  Battery status: Good. R-waves 0.49 mV. 0 symptom episodes, 0 tachy episodes, 0 pause episodes, 0 brady episodes. 0 AF episodes. Pt educated about symptom activator and sending manual transmissions when he presses the activator and to call DC

## 2017-11-24 ENCOUNTER — Ambulatory Visit (HOSPITAL_COMMUNITY)
Admission: RE | Admit: 2017-11-24 | Discharge: 2017-11-24 | Disposition: A | Discharge status: Home / Self Care | Payer: Medicaid Other | Source: Ambulatory Visit | Attending: Cardiology | Admitting: Cardiology

## 2017-11-24 ENCOUNTER — Encounter (HOSPITAL_COMMUNITY): Payer: Self-pay | Admitting: Cardiology

## 2017-11-24 VITALS — BP 160/87 | HR 78 | Wt 211.5 lb

## 2017-11-24 DIAGNOSIS — Z6830 Body mass index (BMI) 30.0-30.9, adult: Secondary | ICD-10-CM | POA: Diagnosis not present

## 2017-11-24 DIAGNOSIS — Z7901 Long term (current) use of anticoagulants: Secondary | ICD-10-CM | POA: Insufficient documentation

## 2017-11-24 DIAGNOSIS — N183 Chronic kidney disease, stage 3 (moderate): Secondary | ICD-10-CM | POA: Diagnosis not present

## 2017-11-24 DIAGNOSIS — Z8546 Personal history of malignant neoplasm of prostate: Secondary | ICD-10-CM | POA: Diagnosis not present

## 2017-11-24 DIAGNOSIS — G629 Polyneuropathy, unspecified: Secondary | ICD-10-CM | POA: Diagnosis not present

## 2017-11-24 DIAGNOSIS — Z951 Presence of aortocoronary bypass graft: Secondary | ICD-10-CM

## 2017-11-24 DIAGNOSIS — E785 Hyperlipidemia, unspecified: Secondary | ICD-10-CM | POA: Insufficient documentation

## 2017-11-24 DIAGNOSIS — Z79899 Other long term (current) drug therapy: Secondary | ICD-10-CM | POA: Insufficient documentation

## 2017-11-24 DIAGNOSIS — R55 Syncope and collapse: Secondary | ICD-10-CM

## 2017-11-24 DIAGNOSIS — I5022 Chronic systolic (congestive) heart failure: Secondary | ICD-10-CM

## 2017-11-24 DIAGNOSIS — I252 Old myocardial infarction: Secondary | ICD-10-CM | POA: Insufficient documentation

## 2017-11-24 DIAGNOSIS — I739 Peripheral vascular disease, unspecified: Secondary | ICD-10-CM | POA: Insufficient documentation

## 2017-11-24 DIAGNOSIS — I255 Ischemic cardiomyopathy: Secondary | ICD-10-CM | POA: Diagnosis not present

## 2017-11-24 DIAGNOSIS — I2510 Atherosclerotic heart disease of native coronary artery without angina pectoris: Secondary | ICD-10-CM | POA: Diagnosis not present

## 2017-11-24 DIAGNOSIS — I4892 Unspecified atrial flutter: Secondary | ICD-10-CM | POA: Diagnosis not present

## 2017-11-24 DIAGNOSIS — E669 Obesity, unspecified: Secondary | ICD-10-CM | POA: Insufficient documentation

## 2017-11-24 MED ORDER — CARVEDILOL 6.25 MG PO TABS: 6.2500 mg | ORAL_TABLET | Freq: Two times a day (BID) | ORAL | 3 refills | Status: DC

## 2017-11-24 NOTE — Patient Instructions (Signed)
Start Carvedilol 6.25 mg (1 tab), twice a day  Your physician recommends that you schedule a follow-up appointment in: 3 months with Dr. Shirlee Latch

## 2017-11-25 NOTE — Progress Notes (Signed)
Patient ID: Christopher [name], male   DOB: 04-06-1961, 57 y.o.   MRN: [id number]   PCP: Dr. Hyman Hopes Cardiology: Dr. Shirlee Latch Vascular: Dr Early  57 y.o. with history of CAD s/p CABG, ischemic cardiomyopathy, and CKD presents for cardiology followup.  In 3/17, he came to the hospital with a late presentation inferior MI.  He was noted to have occluded RCA with collaterals and 95% proximal LAD.  He had CABG x 4 + right CEA.  He had a balloon pump with cardiogenic shock.  He additionally had multiple episodes of post-op VT/VF.  Amiodarone was started.  Repeat cath showed slow flow in the SVG-D and SVG-ramus given diffusely diseased and small distal targets.  He developed AKI.  He had prolonged mechanical ventilation and a tracheostomy.  He had RV failure that was treated with Revatio.  He had transient atrial flutter that resolved quickly.  Last echo in 3/17 showed EF 30-35%.  Finally, the tracheostomy was removed.  He went to inpatient rehab for about a week and is now home again.  Echo 6/17 with EF 40-45%.   Admitted 8/21-8/27/18 with acute respiratory failure due to HCAP and HTN emergency. Also had rapid atrial flutter. Rate improved and DCCV was planned for outpatient as he had missed a few doses of Eliquis at home. Discharge weight was 270 pounds. He had DCCV to NSR in 9/18.  He was admitted again in 4/19 after syncopal episode and was found to be in atrial flutter again.  He had a LINQ monitor placed and was cardioverted back to NSR.  He is in NSR today.  He is feeling good, he has lost another 10 lbs with diet and exercise.  He walks about a mile a day with no problems.  No significant dyspnea or chest pain.  He has cramping in his left calf when walking up a hill.    ECG: NSR, old inferior MI, old anterolateral MI, personally reviewed  Labs (4/17): K 3.4, creatinine 1.6 Labs (5/17): K 4.4, creatinine 1.65, LFTs normal, TSH mildly elevated, free T3 and free T4 normal, BNP 502, hemoglobin 12.3 Labs  (6/17): BNP 737, K 4.4, creatinine 1.72 Labs (7/17): LDL 136, K 4.9, creatinine 1.79 Labs (8/17): BNP 239, K 3.8, creatinine 2.17 Labs (10/17): LDL 74, HDL 34, LFTs normal Labs (7/82): K 5, creatinine 1.67, BNP 774 Labs (6/18): K 3.3, creatinine 1.82 Labs 02/06/2017: K 4.3 Creatinine 1.78.  Labs (9/18): K 4.1, creatinine 1.73 Labs (1/19): LDL 48 Labs (4/19): K 4, creatinine 1.38  PMH: 1. CAD: Late presentation inferior MI in 3/17.  - LHC showed totally occluded pRCA with L=>R collaterals, 95% pLAD, 70% ramus.   - CABG with LIMA-LAD, SVG-D, SVG-ramus, SVG-RCA.  - Repeat echo in 3/17 after CABG showed patent LIMA and SVG-RCA but SVG-D and SVG- ramus had slow flow with small, diffusely diseased distal targets.  - Echo (6/17) with EF 40-45%, inferior severe hypokinesis, mildly dilated RV with normal systolic function, PASP 30 mmHg.  2. Chronic systolic CHF: Ischemic cardiomyopathy.   - Echo (3/17) with EF 30-35%, septal and inferior AK.   - Echo (7/18): EF 40-45%, moderate FBSH, grade II diastolic dysfunction, RV mildly dilated with normal systolic function.  - Echo (4/19): EF 40-45%, mild LV dilation 3. RV failure: was on Revatio, now off.  4. CKD: Stage 3.  5. Atrial flutter: Transient, post-op.   - DCCV to NSR in 9/18. - DCCV to NSR in 4/19.   6. VT: Post-op CABG.  7. Carotid stenosis: Right CEA 3/17.  - Carotid dopplers (9/18): 1-39% LICA, s/p R CEA.  8. PAD: Peripheral artery dopplers (7/17) with > 50% L CIA stenosis, occluded distal external iliac, occluded right SFA, occluded left SFA.  9. Peripheral neuropathy 10. Syncope: 4/19, now has LINQ monitor.   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   Family History  Problem Relation Age of Onset  . Heart disease Mother   . Failure to thrive Father   . Congestive Heart Failure Father        Deceased  . Breast cancer Sister        Living   Review of systems complete and found to be negative  unless listed in HPI.   Current Outpatient Medications  Medication Sig Dispense Refill  . acetaminophen (TYLENOL) 500 MG tablet Take 1,000 mg by mouth as needed for mild pain.    . DULoxetine (CYMBALTA) 30 MG capsule Take 30 mg by mouth daily.    Marland Kitchen ELIQUIS 5 MG TABS tablet TAKE 1 TABLET BY MOUTH TWICE DAILY 60 tablet 6  . gabapentin (NEURONTIN) 300 MG capsule Take 1 capsule (300 mg total) by mouth 2 (two) times daily. 60 capsule 1  . hydrALAZINE (APRESOLINE) 25 MG tablet TAKE 1 TABLET BY MOUTH EVERY 8 HOURS 90 tablet 3  . isosorbide mononitrate (IMDUR) 30 MG 24 hr tablet TAKE 1 TABLET BY MOUTH ONCE DAILY 30 tablet 3  . KLOR-CON M20 20 MEQ tablet TAKE 2 TABLETS BY MOUTH DAILY 60 tablet 6  . QUEtiapine (SEROQUEL) 50 MG tablet Take 25-50 mg by mouth at bedtime.  11  . ranitidine (ZANTAC) 150 MG tablet Take 150 mg by mouth 2 (two) times daily.    . rosuvastatin (CRESTOR) 40 MG tablet TAKE 1 TABLET BY MOUTH ONCE DAILY 30 tablet 11  . senna-docusate (SENOKOT-S) 8.6-50 MG tablet Take 1 tablet by mouth at bedtime. For constipation 30 tablet 0  . spironolactone (ALDACTONE) 25 MG tablet Take 0.5 tablets (12.5 mg total) by mouth daily. 45 tablet 3  . torsemide (DEMADEX) 20 MG tablet Take 2 tablets (40 mg total) by mouth daily. Start on 03/26/17 60 tablet 3  . carvedilol (COREG) 6.25 MG tablet Take 1 tablet (6.25 mg total) by mouth 2 (two) times daily. 60 tablet 3   No current facility-administered medications for this encounter.    Vitals:   11/24/17 0920  BP: (!) 160/87  Pulse: 78  SpO2: 100%  Weight: 211 lb 8 oz (95.9 kg)    Wt Readings from Last 3 Encounters:  11/24/17 211 lb 8 oz (95.9 kg)  11/11/17 231 lb 14.4 oz (105.2 kg)  08/24/17 221 lb 12.8 oz (100.6 kg)    Physical Exam General: NAD Neck: No JVD, no thyromegaly or thyroid nodule.  Lungs: Clear to auscultation bilaterally with normal respiratory effort. CV: Nondisplaced PMI.  Heart regular S1/S2, no S3/S4, no murmur.  No peripheral  edema.  No carotid bruit.  No pulses palpable in feet.  Abdomen: Soft, nontender, no hepatosplenomegaly, no distention.  Skin: Intact without lesions or rashes.  Neurologic: Alert and oriented x 3.  Psych: Normal affect. Extremities: No clubbing or cyanosis.  HEENT: Normal.   Assessment/Plan: 1. CAD: s/p CABG. No chest pain.    - Continue Crestor.  - No ASA with Eliquis.  2. Atrial flutter: Initially occurred post operatively and was transient. Noted to be in Aflutter at office visit in 7/18 and again at hospitalization in  8/18. He had DCCV in 9/18.  He saw Dr. Johney Frame but wanted to hold off on flutter ablation.  However, he had atrial flutter again in 4/19 and was cardioverted.  He is now ready for ablation.  He is in NSR today.   - Continue Eliquis 5 mg BID.  - He has an appointment with EP to discuss ablation . 3. Chronic systolic CHF: Ischemic cardiomyopathy.  Echo in 3/17 with EF 30-35%, improved to 40-45% on echo in 6/17.  Echo 02/2017 EF 40-45%, echo 4/19 with EF 40-45%.  NYHA II, not volume overloaded on exam.  He has been losing weight with diet and exercise.  - Restart Coreg 6.25 mg bid.  - Continue Hydralazine 25 mg TID  - Continue Imdur 30 daily.  - Continue torsemide 40 mg daily. - Continue spironolactone 25 mg daily.  - EF above ICD range.  - He had AKI with ARB and with Entresto.   5. VT: No further.  - Off amiodarone.  6. CKD stage III: BMET today.  7. Carotid stenosis: s/p R CEA in 2017, follows with [facility].  8. Hyperlipidemia: Continue Crestor.   9. PAD:  Stable claudication left calf. We discussed walking through the pain for at least a short distance.  This is his main limitation currently.  - Follows with [facility].  10. Obesity: Much improved with diet/exercise.  11. Syncope: No has LINQ monitor.  No events.    Followup 3 months.    [name], MD  11/25/2017

## 2017-12-05 ENCOUNTER — Encounter: Payer: Self-pay | Admitting: Cardiology

## 2017-12-05 ENCOUNTER — Ambulatory Visit: Payer: Medicaid Other | Admitting: Cardiology

## 2017-12-05 VITALS — BP 150/102 | HR 65 | Ht 70.0 in | Wt 214.8 lb

## 2017-12-05 DIAGNOSIS — I483 Typical atrial flutter: Secondary | ICD-10-CM | POA: Diagnosis not present

## 2017-12-05 DIAGNOSIS — I25810 Atherosclerosis of coronary artery bypass graft(s) without angina pectoris: Secondary | ICD-10-CM | POA: Diagnosis not present

## 2017-12-05 DIAGNOSIS — R55 Syncope and collapse: Secondary | ICD-10-CM | POA: Diagnosis not present

## 2017-12-05 DIAGNOSIS — I255 Ischemic cardiomyopathy: Secondary | ICD-10-CM | POA: Diagnosis not present

## 2017-12-05 DIAGNOSIS — E785 Hyperlipidemia, unspecified: Secondary | ICD-10-CM

## 2017-12-05 NOTE — Progress Notes (Signed)
Electrophysiology Office Note   Date:  12/05/2017   ID:  Christopher [name], DOB Dec 13, 1960, MRN [id number]  PCP:  Shirlean Mylar, MD  Cardiologist:  Shirlee Latch Primary Electrophysiologist:  Hanif Radin Jorja Loa, MD    Chief Complaint  Patient presents with  . Pacemaker Check    Syncope     History of Present Illness: OSA CAMPOLI is a 57 y.o. male who is being seen today for the evaluation of atrial flutter, syncope at the request of Christopher [name]. Presenting today for electrophysiology evaluation.  Is a history of coronary artery disease status post CABG, ischemic cardiomyopathy, CKD.  He presented to the hospital 3/17 with inferior MI.  He is found an occluded RCA with collaterals and a 95% LAD.  He had CABG x4 with a right CEA.  He had multiple episodes of postop VT and VF and amiodarone was started.  Cath showed slow flow in the saphenous vein diagonal and saphenous vein ramus grafts.  He had prolonged mechanical ventilation and tracheostomy placement.  He had transient atrial flutter that resolved quickly.  He was admitted 4/19 for syncopal episode and found to be in atrial flutter.  Link monitor was placed and he was cardioverted back to sinus rhythm.     Today, he denies symptoms of palpitations, chest pain, shortness of breath, orthopnea, PND, lower extremity edema, claudication, dizziness, presyncope, syncope, bleeding, or neurologic sequela. The patient is tolerating medications without difficulties.    Past Medical History:  Diagnosis Date  . Anxiety   . Atypical atrial flutter (HCC)   . CHF (congestive heart failure) (HCC)    EF 45%  . CKD (chronic kidney disease), stage III (HCC)    Christopher [name] 03/23/2017  . Complete heart block (HCC) 10/20/2015   in setting of inferior MI, now resolved  . COPD (chronic obstructive pulmonary disease) (HCC)    pt denies this hx on 03/23/2017  . Coronary artery disease   . High cholesterol   . Hypertension   . Myocardial infarction [facility])  10/2015   "massive one; saw scar tissue of possible previous MI maybe in 2013" (03/23/2017)  . Overweight   . Stenosis of right carotid artery   . TIA (transient ischemic attack) 2016  . Ventricular tachycardia (HCC) 10/2015   in setting of MI, now resolved   Past Surgical History:  Procedure Laterality Date  . CARDIAC CATHETERIZATION N/A 10/20/2015   Procedure: Left Heart Cath and Coronary Angiography;  Surgeon: Tonny Bollman, MD;  Location: [facility] INVASIVE CV LAB;  Service: Cardiovascular;  Laterality: N/A;  . CARDIAC CATHETERIZATION N/A 11/02/2015   Procedure: Right/Left Heart Cath and Coronary/Graft Angiography;  Surgeon: Laurey Morale, MD;  Location: [facility] INVASIVE CV LAB;  Service: Cardiovascular;  Laterality: N/A;  . CARDIOVERSION N/A 05/04/2017   Procedure: CARDIOVERSION;  Surgeon: Laurey Morale, MD;  Location: [facility] ENDOSCOPY;  Service: Cardiovascular;  Laterality: N/A;  . CARDIOVERSION  11/10/2017  . CARDIOVERSION N/A 11/10/2017   Procedure: CARDIOVERSION;  Surgeon: Laurey Morale, MD;  Location: [facility] ENDOSCOPY;  Service: Cardiovascular;  Laterality: N/A;  . CORONARY ARTERY BYPASS GRAFT N/A 10/27/2015   Procedure: CORONARY ARTERY BYPASS GRAFTING (CABG) x four, using left internal mammary artery and rt leg greater saphenous vein harvested endoscopically;  Surgeon: Kerin Perna, MD;  Location: [facility] OR;  Service: Open Heart Surgery;  Laterality: N/A;  . ENDARTERECTOMY Right 10/27/2015   Procedure: RIGHT ENDARTERECTOMY CAROTID with patch angioplasty using Xenosure bovine pericardium patch;  Surgeon: Fransisco Hertz,  MD;  Location: [facility] OR;  Service: Vascular;  Laterality: Right;  . EYE MUSCLE SURGERY Left 1970s   "lazy eye"  . LACERATION REPAIR Right 2003   RUE; "fell thru plate-glass window"  . LOOP RECORDER INSERTION  11/10/2017  . LOOP RECORDER INSERTION N/A 11/10/2017   Procedure: LOOP RECORDER INSERTION;  Surgeon: Regan Lemming, MD;  Location: [facility] INVASIVE CV LAB;  Service:  Cardiovascular;  Laterality: N/A;  . TEE WITHOUT CARDIOVERSION N/A 10/27/2015   Procedure: TRANSESOPHAGEAL ECHOCARDIOGRAM (TEE);  Surgeon: Kerin Perna, MD;  Location: [facility] OR;  Service: Open Heart Surgery;  Laterality: N/A;     Current Outpatient Medications  Medication Sig Dispense Refill  . acetaminophen (TYLENOL) 500 MG tablet Take 1,000 mg by mouth as needed for mild pain.    . carvedilol (COREG) 6.25 MG tablet Take 1 tablet (6.25 mg total) by mouth 2 (two) times daily. 60 tablet 3  . DULoxetine (CYMBALTA) 30 MG capsule Take 30 mg by mouth daily.    Marland Kitchen ELIQUIS 5 MG TABS tablet TAKE 1 TABLET BY MOUTH TWICE DAILY 60 tablet 6  . gabapentin (NEURONTIN) 300 MG capsule Take 1 capsule (300 mg total) by mouth 2 (two) times daily. 60 capsule 1  . hydrALAZINE (APRESOLINE) 25 MG tablet TAKE 1 TABLET BY MOUTH EVERY 8 HOURS 90 tablet 3  . isosorbide mononitrate (IMDUR) 30 MG 24 hr tablet TAKE 1 TABLET BY MOUTH ONCE DAILY 30 tablet 3  . KLOR-CON M20 20 MEQ tablet TAKE 2 TABLETS BY MOUTH DAILY 60 tablet 6  . QUEtiapine (SEROQUEL) 50 MG tablet Take 25-50 mg by mouth at bedtime.  11  . ranitidine (ZANTAC) 150 MG tablet Take 150 mg by mouth 2 (two) times daily.    . rosuvastatin (CRESTOR) 40 MG tablet TAKE 1 TABLET BY MOUTH ONCE DAILY 30 tablet 11  . senna-docusate (SENOKOT-S) 8.6-50 MG tablet Take 1 tablet by mouth at bedtime. For constipation 30 tablet 0  . spironolactone (ALDACTONE) 25 MG tablet Take 0.5 tablets (12.5 mg total) by mouth daily. 45 tablet 3  . torsemide (DEMADEX) 20 MG tablet Take 2 tablets (40 mg total) by mouth daily. Start on 03/26/17 60 tablet 3   No current facility-administered medications for this visit.     Allergies:   Losartan; Entresto [sacubitril-valsartan]; Fortaz [ceftazidime]; and Lisinopril   Social History:  The patient  reports that he quit smoking about 15 years ago. His smoking use included cigarettes. He has a 23.00 pack-year smoking history. He has never used  smokeless tobacco. He reports that he has current or past drug history. Drug: Marijuana. He reports that he does not drink alcohol.   Family History:  The patient's family history includes Breast cancer in his sister; Congestive Heart Failure in his father; Failure to thrive in his father; Heart disease in his mother.    ROS:  Please see the history of present illness.   Otherwise, review of systems is positive for none.   All other systems are reviewed and negative.    PHYSICAL EXAM: VS:  BP (!) 150/102   Pulse 65   Ht 5\' 10"  (1.778 m)   Wt 214 lb 12.8 oz (97.4 kg)   SpO2 96%   BMI 30.82 kg/m  , BMI Body mass index is 30.82 kg/m. GEN: Well nourished, well developed, in no acute distress  HEENT: normal  Neck: no JVD, carotid bruits, or masses Cardiac: RRR; no murmurs, rubs, or gallops,no edema  Respiratory:  clear to  auscultation bilaterally, normal work of breathing GI: soft, nontender, nondistended, + BS MS: no deformity or atrophy  Skin: warm and dry, device pocket is well healed Neuro:  Strength and sensation are intact Psych: euthymic mood, full affect  EKG:  EKG is not ordered today. Personal review of the ekg ordered 11/24/17 shows SR, raet 63, BAE, IMI, anterolateral MI  Device interrogation is reviewed today in detail.  See PaceArt for details.   Recent Labs: 03/28/2017: B Natriuretic Peptide 1,836.9 11/10/2017: ALT 19; Hemoglobin 14.2; Magnesium 2.2; Platelets 201 11/11/2017: BUN 15; Creatinine, Ser 1.38; Potassium 4.0; Sodium 141    Lipid Panel     Component Value Date/Time   CHOL 109 08/24/2017 1437   TRIG 167 (H) 08/24/2017 1437   HDL 28 (L) 08/24/2017 1437   CHOLHDL 3.9 08/24/2017 1437   VLDL 33 08/24/2017 1437   LDLCALC 48 08/24/2017 1437     Wt Readings from Last 3 Encounters:  12/05/17 214 lb 12.8 oz (97.4 kg)  11/24/17 211 lb 8 oz (95.9 kg)  11/11/17 231 lb 14.4 oz (105.2 kg)      Other studies Reviewed: Additional studies/ records that were  reviewed today include: TTE 11/10/17  Review of the above records today demonstrates:  - Left ventricle: The cavity size was mildly dilated. Systolic   function was mildly to moderately reduced. The estimated ejection   fraction was in the range of 40% to 45%. Mild diffuse   hypokinesis. There was a reduced contribution of atrial   contraction to ventricular filling, due to increased ventricular   diastolic pressure or atrial contractile dysfunction. Doppler   parameters are consistent with a reversible restrictive pattern,   indicative of decreased left ventricular diastolic compliance   and/or increased left atrial pressure (grade 3 diastolic   dysfunction). Doppler parameters are consistent with high   ventricular filling pressure. - Mitral valve: There was mild regurgitation. - Left atrium: The atrium was mildly dilated. - Pulmonary arteries: PA peak pressure: 40 mm Hg (S).   ASSESSMENT AND PLAN:  1.  Atrial flutter: Appears to be typical.  Is currently on Eliquis.  I discussed with her the possibility of ablation.  Risks and benefits include bleeding, tamponade, heart block, stroke.  The patient understands these risks and is agreed to the procedure.  2.  Coronary artery disease: Status post CABG.  No chest pain.  Continue Crestor, no aspirin due to Eliquis.  3.  Chronic systolic heart failure due to ischemic cardiomyopathy.  EF 40 to 45% on most recent echo.  4.  Syncope: Patient has Linq monitor.  No events.  5.  Hyperlipidemia: Continue Crestor    Current medicines are reviewed at length with the patient today.   The patient does not have concerns regarding his medicines.  The following changes were made today:  none  Labs/ tests ordered today include:  No orders of the defined types were placed in this encounter.    Disposition:   FU with Latiqua Daloia 2 months  Signed, Alicya Bena Jorja Loa, MD  12/05/2017 2:59 PM     [facility] [address] [place] [address] [phone] (office) [phone] (fax)

## 2017-12-05 NOTE — Patient Instructions (Signed)
Medication Instructions:  Your physician recommends that you continue on your current medications as directed. Please refer to the Current Medication list given to you today.  Labwork: None ordered  Testing/Procedures: Your physician has recommended that you have an ablation. Catheter ablation is a medical procedure used to treat some cardiac arrhythmias (irregular heartbeats). During catheter ablation, a long, thin, flexible tube is put into a blood vessel in your groin (upper thigh), or neck. This tube is called an ablation catheter. It is then guided to your heart through the blood vessel. Radio frequency waves destroy small areas of heart tissue where abnormal heartbeats may cause an arrhythmia to start.  Instructions for your ablation: 1. Please arrive at the [facility], Main Entrance "A", of [facility] at 8:30 a.m. on 01/03/2018. 2. Do not eat or drink after midnight the night prior to the procedure. 3. Do not miss any doses of ELIQUIS prior to the morning of the procedure.  4. Do not take any medications the morning of the procedure. 5. You will shaved for this procedure (if needed). Typically both groins, chest and back  are shaven. We ask that you shave these areas yourself at home 1-2 days prior  to the procedure. If you are uncomfortable/unable, then hospital staff will shave  these areas the morning of your procedure. 6. Plan for an overnight stay in the hospital. 7. You will need someone to drive you home at discharge.   Follow-Up: Your physician recommends that you schedule a follow-up appointment in: 4 weeks, after your procedure on 01/03/2018, with Dr. Elberta Fortis.  * If you need a refill on your cardiac medications before your next appointment, please call your pharmacy.   *Please note that any paperwork needing to be filled out by the provider will need to be addressed at the front desk prior to seeing the provider. Please note that any FMLA, disability or other  documents regarding health condition is subject to a $25.00 charge that must be received prior to completion of paperwork in the form of a money order or check.  Thank you for choosing [facility]!!   Dory Horn, RN [phone]  Any Other Special Instructions Will Be Listed Below (If Applicable).   Cardiac Ablation Cardiac ablation is a procedure to disable (ablate) a small amount of heart tissue in very specific places. The heart has many electrical connections. Sometimes these connections are abnormal and can cause the heart to beat very fast or irregularly. Ablating some of the problem areas can improve the heart rhythm or return it to normal. Ablation may be done for people who:  Have Wolff-Parkinson-White syndrome.  Have fast heart rhythms (tachycardia).  Have taken medicines for an abnormal heart rhythm (arrhythmia) that were not effective or caused side effects.  Have a high-risk heartbeat that may be life-threatening.  During the procedure, a small incision is made in the neck or the groin, and a long, thin, flexible tube (catheter) is inserted into the incision and moved to the heart. Small devices (electrodes) on the tip of the catheter will send out electrical currents. A type of X-ray (fluoroscopy) will be used to help guide the catheter and to provide images of the heart. Tell a health care provider about:  Any allergies you have.  All medicines you are taking, including vitamins, herbs, eye drops, creams, and over-the-counter medicines.  Any problems you or family members have had with anesthetic medicines.  Any blood disorders you have.  Any surgeries you  have had.  Any medical conditions you have, such as kidney failure.  Whether you are pregnant or may be pregnant. What are the risks? Generally, this is a safe procedure. However, problems may occur, including:  Infection.  Bruising and bleeding at the catheter insertion site.  Bleeding into the  chest, especially into the sac that surrounds the heart. This is a serious complication.  Stroke or blood clots.  Damage to other structures or organs.  Allergic reaction to medicines or dyes.  Need for a permanent pacemaker if the normal electrical system is damaged. A pacemaker is a small computer that sends electrical signals to the heart and helps your heart beat normally.  The procedure not being fully effective. This may not be recognized until months later. Repeat ablation procedures are sometimes required.  What happens before the procedure?  Follow instructions from your health care provider about eating or drinking restrictions.  Ask your health care provider about: ? Changing or stopping your regular medicines. This is especially important if you are taking diabetes medicines or blood thinners. ? Taking medicines such as aspirin and ibuprofen. These medicines can thin your blood. Do not take these medicines before your procedure if your health care provider instructs you not to.  Plan to have someone take you home from the hospital or clinic.  If you will be going home right after the procedure, plan to have someone with you for 24 hours. What happens during the procedure?  To lower your risk of infection: ? Your health care team will wash or sanitize their hands. ? Your skin will be washed with soap. ? Hair may be removed from the incision area.  An IV tube will be inserted into one of your veins.  You will be given a medicine to help you relax (sedative).  The skin on your neck or groin will be numbed.  An incision will be made in your neck or your groin.  A needle will be inserted through the incision and into a large vein in your neck or groin.  A catheter will be inserted into the needle and moved to your heart.  Dye may be injected through the catheter to help your surgeon see the area of the heart that needs treatment.  Electrical currents will be sent  from the catheter to ablate heart tissue in desired areas. There are three types of energy that may be used to ablate heart tissue: ? Heat (radiofrequency energy). ? Laser energy. ? Extreme cold (cryoablation).  When the necessary tissue has been ablated, the catheter will be removed.  Pressure will be held on the catheter insertion area to prevent excessive bleeding.  A bandage (dressing) will be placed over the catheter insertion area. The procedure may vary among health care providers and hospitals. What happens after the procedure?  Your blood pressure, heart rate, breathing rate, and blood oxygen level will be monitored until the medicines you were given have worn off.  Your catheter insertion area will be monitored for bleeding. You will need to lie still for a few hours to ensure that you do not bleed from the catheter insertion area.  Do not drive for 5-7 days or as long as directed by your health care provider. Summary  Cardiac ablation is a procedure to disable (ablate) a small amount of heart tissue in very specific places. Ablating some of the problem areas can improve the heart rhythm or return it to normal.  During the procedure, electrical currents  will be sent from the catheter to ablate heart tissue in desired areas. This information is not intended to replace advice given to you by your health care provider. Make sure you discuss any questions you have with your health care provider. Document Released: 12/11/2008 Document Revised: 06/13/2016 Document Reviewed: 06/13/2016 Elsevier Interactive Patient Education  [employer].

## 2017-12-11 ENCOUNTER — Telehealth: Payer: Self-pay | Admitting: *Deleted

## 2017-12-11 NOTE — Telephone Encounter (Signed)
Spoke with patient regarding brady episode noted on LINQ from 12/08/17 at 0641, duration 7sec, ECG appears 2:1 HB.  Patient reports he was awake and asymptomatic with this episode.  He usually wakes up anywhere from 3am-5am, but is definitely awake by 7am every day.  Advised that Dr. Elberta Fortis will review episode and we will call him back if any additional recommendations.  Patient is agreeable to plan.  ECG printed and placed in Dr. Mickey Farber folder for review.

## 2017-12-12 LAB — CUP PACEART INCLINIC DEVICE CHECK
MDC IDC PG IMPLANT DT: 20190405
MDC IDC SESS DTM: 20190430184401

## 2017-12-13 ENCOUNTER — Ambulatory Visit (INDEPENDENT_AMBULATORY_CARE_PROVIDER_SITE_OTHER): Payer: Medicaid Other | Admitting: *Deleted

## 2017-12-13 DIAGNOSIS — R55 Syncope and collapse: Secondary | ICD-10-CM | POA: Diagnosis not present

## 2017-12-14 NOTE — Progress Notes (Signed)
Carelink Summary Report / Loop Recorder 

## 2017-12-15 ENCOUNTER — Other Ambulatory Visit (HOSPITAL_COMMUNITY): Payer: Self-pay | Admitting: Cardiology

## 2017-12-19 ENCOUNTER — Telehealth: Payer: Self-pay | Admitting: *Deleted

## 2017-12-19 NOTE — Telephone Encounter (Signed)
Advised patient of schedule change at the hospital the morning of his procedure. Informed patient that his procedure time has been moved up 1 hour.  Advised to arrive at 7:30 a.m. Patient verbalized understanding and agreeable to plan.

## 2018-01-03 ENCOUNTER — Ambulatory Visit (HOSPITAL_COMMUNITY): Payer: Medicaid Other | Admitting: Certified Registered Nurse Anesthetist

## 2018-01-03 ENCOUNTER — Encounter (HOSPITAL_COMMUNITY)
Admission: RE | Disposition: A | Discharge status: Home / Self Care | Payer: Self-pay | Source: Ambulatory Visit | Attending: Cardiology

## 2018-01-03 ENCOUNTER — Encounter (HOSPITAL_COMMUNITY): Payer: Self-pay | Admitting: Certified Registered Nurse Anesthetist

## 2018-01-03 ENCOUNTER — Ambulatory Visit (HOSPITAL_COMMUNITY)
Admission: RE | Admit: 2018-01-03 | Discharge: 2018-01-03 | Disposition: A | Discharge status: Home / Self Care | Payer: Medicaid Other | Source: Ambulatory Visit | Attending: Cardiology | Admitting: Cardiology

## 2018-01-03 DIAGNOSIS — Z87891 Personal history of nicotine dependence: Secondary | ICD-10-CM | POA: Insufficient documentation

## 2018-01-03 DIAGNOSIS — I4892 Unspecified atrial flutter: Secondary | ICD-10-CM | POA: Diagnosis not present

## 2018-01-03 DIAGNOSIS — N289 Disorder of kidney and ureter, unspecified: Secondary | ICD-10-CM | POA: Diagnosis not present

## 2018-01-03 DIAGNOSIS — I110 Hypertensive heart disease with heart failure: Secondary | ICD-10-CM | POA: Diagnosis not present

## 2018-01-03 DIAGNOSIS — I739 Peripheral vascular disease, unspecified: Secondary | ICD-10-CM | POA: Diagnosis not present

## 2018-01-03 DIAGNOSIS — I471 Supraventricular tachycardia: Secondary | ICD-10-CM | POA: Diagnosis not present

## 2018-01-03 DIAGNOSIS — I4891 Unspecified atrial fibrillation: Secondary | ICD-10-CM | POA: Diagnosis not present

## 2018-01-03 DIAGNOSIS — I509 Heart failure, unspecified: Secondary | ICD-10-CM | POA: Diagnosis not present

## 2018-01-03 DIAGNOSIS — J449 Chronic obstructive pulmonary disease, unspecified: Secondary | ICD-10-CM | POA: Diagnosis not present

## 2018-01-03 DIAGNOSIS — I252 Old myocardial infarction: Secondary | ICD-10-CM | POA: Diagnosis not present

## 2018-01-03 DIAGNOSIS — I2510 Atherosclerotic heart disease of native coronary artery without angina pectoris: Secondary | ICD-10-CM | POA: Diagnosis not present

## 2018-01-03 HISTORY — PX: A-FLUTTER ABLATION: EP1230

## 2018-01-03 LAB — CBC
HCT: 46.8 % (ref 39.0–52.0)
Hemoglobin: 15.3 g/dL (ref 13.0–17.0)
MCH: 29.4 pg (ref 26.0–34.0)
MCHC: 32.7 g/dL (ref 30.0–36.0)
MCV: 90 fL (ref 78.0–100.0)
PLATELETS: 169 10*3/uL (ref 150–400)
RBC: 5.2 MIL/uL (ref 4.22–5.81)
RDW: 15.1 % (ref 11.5–15.5)
WBC: 7.5 10*3/uL (ref 4.0–10.5)

## 2018-01-03 LAB — POCT I-STAT, CHEM 8
BUN: 20 mg/dL (ref 6–20)
CHLORIDE: 104 mmol/L (ref 101–111)
Calcium, Ion: 1.16 mmol/L (ref 1.15–1.40)
Creatinine, Ser: 1.3 mg/dL — ABNORMAL HIGH (ref 0.61–1.24)
Glucose, Bld: 93 mg/dL (ref 65–99)
HEMATOCRIT: 46 % (ref 39.0–52.0)
HEMOGLOBIN: 15.6 g/dL (ref 13.0–17.0)
POTASSIUM: 4.1 mmol/L (ref 3.5–5.1)
SODIUM: 143 mmol/L (ref 135–145)
TCO2: 27 mmol/L (ref 22–32)

## 2018-01-03 SURGERY — A-FLUTTER ABLATION
Anesthesia: Monitor Anesthesia Care

## 2018-01-03 MED ORDER — PHENYLEPHRINE HCL 10 MG/ML IJ SOLN
INTRAMUSCULAR | Status: DC | PRN
  Administered 2018-01-03 (×2): 40 ug via INTRAVENOUS

## 2018-01-03 MED ORDER — ONDANSETRON HCL 4 MG/2ML IJ SOLN
INTRAMUSCULAR | Status: DC | PRN
  Administered 2018-01-03: 4 mg via INTRAVENOUS

## 2018-01-03 MED ORDER — ONDANSETRON HCL 4 MG/2ML IJ SOLN: 4.0000 mg | Freq: Four times a day (QID) | INTRAMUSCULAR | Status: DC | PRN

## 2018-01-03 MED ORDER — HEPARIN (PORCINE) IN NACL 1000-0.9 UT/500ML-% IV SOLN
INTRAVENOUS | Status: AC
  Filled 2018-01-03: qty 500

## 2018-01-03 MED ORDER — SODIUM CHLORIDE 0.9 % IV SOLN
INTRAVENOUS | Status: DC
  Administered 2018-01-03 (×3): via INTRAVENOUS

## 2018-01-03 MED ORDER — LIDOCAINE HCL (CARDIAC) PF 100 MG/5ML IV SOSY
PREFILLED_SYRINGE | INTRAVENOUS | Status: DC | PRN
  Administered 2018-01-03: 100 mg via INTRAVENOUS

## 2018-01-03 MED ORDER — FENTANYL CITRATE (PF) 100 MCG/2ML IJ SOLN
INTRAMUSCULAR | Status: DC | PRN
  Administered 2018-01-03: 50 ug via INTRAVENOUS
  Administered 2018-01-03: 100 ug via INTRAVENOUS

## 2018-01-03 MED ORDER — PROPOFOL 10 MG/ML IV BOLUS
INTRAVENOUS | Status: DC | PRN
  Administered 2018-01-03: 150 mg via INTRAVENOUS

## 2018-01-03 MED ORDER — SUGAMMADEX SODIUM 200 MG/2ML IV SOLN
INTRAVENOUS | Status: DC | PRN
  Administered 2018-01-03: 200 mg via INTRAVENOUS

## 2018-01-03 MED ORDER — BUPIVACAINE HCL (PF) 0.25 % IJ SOLN
INTRAMUSCULAR | Status: AC
  Filled 2018-01-03: qty 30

## 2018-01-03 MED ORDER — SODIUM CHLORIDE 0.9% FLUSH: 3.0000 mL | Freq: Two times a day (BID) | INTRAVENOUS | Status: DC

## 2018-01-03 MED ORDER — ROCURONIUM BROMIDE 100 MG/10ML IV SOLN
INTRAVENOUS | Status: DC | PRN
  Administered 2018-01-03: 50 mg via INTRAVENOUS

## 2018-01-03 MED ORDER — HEPARIN (PORCINE) IN NACL 2-0.9 UNITS/ML
INTRAMUSCULAR | Status: AC | PRN
  Administered 2018-01-03: 500 mL

## 2018-01-03 MED ORDER — BUPIVACAINE HCL (PF) 0.25 % IJ SOLN
INTRAMUSCULAR | Status: DC | PRN
  Administered 2018-01-03: 30 mL

## 2018-01-03 MED ORDER — EPHEDRINE SULFATE 50 MG/ML IJ SOLN
INTRAMUSCULAR | Status: DC | PRN
  Administered 2018-01-03 (×2): 10 mg via INTRAVENOUS

## 2018-01-03 MED ORDER — MIDAZOLAM HCL 5 MG/5ML IJ SOLN
INTRAMUSCULAR | Status: DC | PRN
  Administered 2018-01-03 (×2): 2 mg via INTRAVENOUS

## 2018-01-03 MED ORDER — SODIUM CHLORIDE 0.9% FLUSH: 3.0000 mL | INTRAVENOUS | Status: DC | PRN

## 2018-01-03 MED ORDER — SODIUM CHLORIDE 0.9 % IV SOLN: 250.0000 mL | INTRAVENOUS | Status: DC | PRN

## 2018-01-03 SURGICAL SUPPLY — 12 items
BAG SNAP BAND KOVER 36X36 (MISCELLANEOUS) ×2 IMPLANT
BLANKET WARM UNDERBOD FULL ACC (MISCELLANEOUS) ×2 IMPLANT
CATH EZ STEER NAV 8MM D-F CUR (ABLATOR) ×2 IMPLANT
CATH JOSEPHSON QUAD-ALLRED 6FR (CATHETERS) ×2 IMPLANT
CATH WEB BIDIR CS D-F NONAUTO (CATHETERS) ×2 IMPLANT
PACK EP LATEX FREE (CUSTOM PROCEDURE TRAY) ×1
PACK EP LF (CUSTOM PROCEDURE TRAY) ×1 IMPLANT
PAD DEFIB LIFELINK (PAD) ×2 IMPLANT
PATCH CARTO3 (PAD) ×2 IMPLANT
SHEATH PINNACLE 6F 10CM (SHEATH) ×2 IMPLANT
SHEATH PINNACLE 7F 10CM (SHEATH) ×2 IMPLANT
SHEATH PINNACLE 8F 10CM (SHEATH) ×2 IMPLANT

## 2018-01-03 NOTE — Discharge Instructions (Signed)

## 2018-01-03 NOTE — Anesthesia Procedure Notes (Signed)
Procedure Name: Intubation Date/Time: 01/03/2018 10:12 AM Performed by: Lovada Barwick T, CRNA Pre-anesthesia Checklist: Patient identified, Emergency Drugs available, Suction available and Patient being monitored Patient Re-evaluated:Patient Re-evaluated prior to induction Oxygen Delivery Method: Circle system utilized Preoxygenation: Pre-oxygenation with 100% oxygen Induction Type: IV induction Ventilation: Mask ventilation without difficulty Laryngoscope Size: Miller and 3 Grade View: Grade I Tube type: Oral Tube size: 7.5 mm Number of attempts: 1 Airway Equipment and Method: Patient positioned with wedge pillow and Stylet Placement Confirmation: ETT inserted through vocal cords under direct vision,  positive ETCO2 and breath sounds checked- equal and bilateral Secured at: 23 cm Tube secured with: Tape Dental Injury: Teeth and Oropharynx as per pre-operative assessment

## 2018-01-03 NOTE — Anesthesia Preprocedure Evaluation (Addendum)
Anesthesia Evaluation  Patient identified by MRN, date of birth, ID band Patient awake    Reviewed: Allergy & Precautions, NPO status , Patient's Chart, lab work & pertinent test results  Airway Mallampati: II  TM Distance: >3 FB     Dental   Pulmonary COPD, former smoker,    breath sounds clear to auscultation       Cardiovascular hypertension, + CAD, + Past MI, + Peripheral Vascular Disease and +CHF  + dysrhythmias Atrial Fibrillation  Rhythm:Regular Rate:Normal     Neuro/Psych    GI/Hepatic negative GI ROS, Neg liver ROS,   Endo/Other    Renal/GU Renal disease     Musculoskeletal   Abdominal   Peds  Hematology   Anesthesia Other Findings   Reproductive/Obstetrics                             Anesthesia Physical Anesthesia Plan  ASA: III  Anesthesia Plan: General   Post-op Pain Management:    Induction: Intravenous  PONV Risk Score and Plan: Treatment may vary due to age or medical condition  Airway Management Planned: Simple Face Mask  Additional Equipment:   Intra-op Plan:   Post-operative Plan:   Informed Consent: I have reviewed the patients History and Physical, chart, labs and discussed the procedure including the risks, benefits and alternatives for the proposed anesthesia with the patient or authorized representative who has indicated his/her understanding and acceptance.   Dental advisory given  Plan Discussed with: CRNA and Anesthesiologist  Anesthesia Plan Comments:        Anesthesia Quick Evaluation

## 2018-01-03 NOTE — Progress Notes (Signed)
Site area: Right femoral vein  Site Prior to Removal: level 0  Pressure Applied For: 15 min  Minutes Beginning at: 1220  Manual: Yes  Patient Status During Pull: A/O without complaints   Post Pull Groin Site: level 0  Post Pull Instructions Given:  yes, verbalized and demonstrated understanding  Post Pull Pulses Present:  yes, DP (R)  Dressing Applied: guaze, tegaderm  Bedrest started at:  1235

## 2018-01-03 NOTE — Transfer of Care (Signed)
Immediate Anesthesia Transfer of Care Note  Patient: Christopher [name]  Procedure(s) Performed: A-FLUTTER ABLATION (N/A )  Patient Location: Cath Lab  Anesthesia Type:General  Level of Consciousness: awake, alert  and oriented  Airway & Oxygen Therapy: Patient Spontanous Breathing and Patient connected to nasal cannula oxygen  Post-op Assessment: Report given to RN, Post -op Vital signs reviewed and stable and Patient moving all extremities  Post vital signs: Reviewed and stable  Last Vitals:  Vitals Value Taken Time  BP    Temp 36.4 C 01/03/2018 12:12 PM  Pulse 52 01/03/2018 12:14 PM  Resp 12 01/03/2018 12:14 PM  SpO2 98 % 01/03/2018 12:14 PM  Vitals shown include unvalidated device data.  Last Pain:  Vitals:   01/03/18 1212  TempSrc: Tympanic  PainSc: 0-No pain         Complications: No apparent anesthesia complications

## 2018-01-03 NOTE — Anesthesia Postprocedure Evaluation (Signed)
Anesthesia Post Note  Patient: Christopher [name]  Procedure(s) Performed: A-FLUTTER ABLATION (N/A )     Patient location during evaluation: Cath Lab Anesthesia Type: General Level of consciousness: awake Pain management: pain level controlled Vital Signs Assessment: post-procedure vital signs reviewed and stable Cardiovascular status: stable Anesthetic complications: no    Last Vitals:  Vitals:   01/03/18 1235 01/03/18 1308  BP: (!) 124/43 124/84  Pulse: (!) 52 (!) 52  Resp: 11 16  Temp:  (!) 36.3 C  SpO2: 100% 96%    Last Pain:  Vitals:   01/03/18 1308  TempSrc: Oral  PainSc: 0-No pain                 [name]

## 2018-01-03 NOTE — H&P (Signed)
Christopher [name] has presented today for surgery, with the diagnosis of atrial flutter.  The various methods of treatment have been discussed with the patient and family. After consideration of risks, benefits and other options for treatment, the patient has consented to  Procedure(s): Catheter ablation as a surgical intervention .  Risks include but not limited to bleeding, tamponade, heart block, stroke, damage to surrounding organs, among others. The patient's history has been reviewed, patient examined, no change in status, stable for surgery.  I have reviewed the patient's chart and labs.  Questions were answered to the patient's satisfaction.    Mathew Postiglione Elberta Fortis, MD 01/03/2018 7:35 AM

## 2018-01-04 ENCOUNTER — Encounter (HOSPITAL_COMMUNITY): Payer: Self-pay | Admitting: Cardiology

## 2018-01-04 ENCOUNTER — Telehealth: Payer: Self-pay | Admitting: Cardiology

## 2018-01-04 NOTE — Telephone Encounter (Signed)
New message    Pt is calling about his procedure from yesterday. He said his directions that were given were a little confusing and he wants to make sure he understands them.

## 2018-01-04 NOTE — Telephone Encounter (Signed)
Reviewed post ablation restrictions with patient. Patient verbalized understanding and agreeable to plan.  He appreciates my return call.

## 2018-01-05 ENCOUNTER — Telehealth: Payer: Self-pay | Admitting: *Deleted

## 2018-01-05 DIAGNOSIS — I5022 Chronic systolic (congestive) heart failure: Secondary | ICD-10-CM

## 2018-01-05 NOTE — Telephone Encounter (Signed)
Spoke with patient regarding 1 brady and 1 pause episode noted on LINQ.  Pause episode ECG is not available, requested manual transmission and gave verbal instructions to patient.  He will plan to transmit when he returns home later this afternoon.  Huston Foley episode is from 01/04/18, duration 7sec, occurred at 0859, ECG appears 2:1 HB.  Patient reports he was definitely awake and "doing nothing" all morning, just sitting on the couch.  Patient asymptomatic, recalled he may have been having a BM at that specific time.  No recent presyncope/syncope.  S/P A-flutter ablation on 01/03/18.  Advised that I will review full report when transmission received.  Patient is agreeable to this plan and denies concerns at this time.

## 2018-01-05 NOTE — Telephone Encounter (Addendum)
Discussed with Dr. Elberta Fortis via phone.  Even if pause ECG is not received today, no recommended changes right now as patient has not had a symptomatic episode (syncope) since ILR implanted.  No driving in immediate post-ablation period per Dr. Elberta Fortis.  Spoke with patient.  Assisted him with sending a manual transmission for review as his attempt was not successful.  "Pause" episode is appropriate, occurred on 01/03/18 at 1110, duration 4 sec (timing correlates with ablation procedure).  Reminded him of driving restrictions post-ablation.  Patient states he is aware of 5 day restriction, but states he was never told of any additional driving restrictions post-syncope.  Advised I will clarify with Dr. Elberta Fortis when he is back in the office next week.  Patient is aware to seek emergency medical attention for syncopal episodes.  Patient is agreeable and denies additional questions or concerns at this time.

## 2018-01-08 LAB — CUP PACEART REMOTE DEVICE CHECK
Date Time Interrogation Session: 20190508203917
MDC IDC PG IMPLANT DT: 20190405

## 2018-01-08 MED ORDER — CARVEDILOL 3.125 MG PO TABS: 3.1250 mg | ORAL_TABLET | Freq: Two times a day (BID) | ORAL | 11 refills | Status: DC

## 2018-01-08 NOTE — Telephone Encounter (Signed)
Informed patient he may return to exercising after Wednesday. Patient verbalized understanding and agreeable to plan.

## 2018-01-08 NOTE — Telephone Encounter (Signed)
"  Christopher [name]" episode noted by ILR on 01/07/18 at 0537, ECG shows transient 2:1 HB, duration 9sec, similar to previous brady episodes.  Per Dr. Elberta Fortis, decrease carvedilol to 3.125mg  BID.  Also clarified with Dr. Elberta Fortis that driving restrictions per [facility] DMV are in place x6 months post-syncope.  Spoke with patient and advised him of Dr. Gershon Crane recommendations.  New prescription for carvedilol 3.125mg  BID sent electronically to patient's preferred pharmacy.  Patient made aware of [place] DMV driving restrictions x6 months (until 05/2018).  Patient agrees to call our office with presyncopal/syncopal episodes.  Will continue monitoring remotely.  Patient asked for clarification about what would be considered "vigorous activity" s/p A-flutter ablation.  He would like to return to the gym.  Advised I will route this message to Dory Horn, RN, for further assistance.  Patient is appreciative.

## 2018-01-12 ENCOUNTER — Other Ambulatory Visit: Payer: Self-pay | Admitting: Cardiology

## 2018-01-15 ENCOUNTER — Ambulatory Visit (INDEPENDENT_AMBULATORY_CARE_PROVIDER_SITE_OTHER): Payer: Medicaid Other | Admitting: *Deleted

## 2018-01-15 DIAGNOSIS — R55 Syncope and collapse: Secondary | ICD-10-CM

## 2018-01-16 NOTE — Progress Notes (Signed)
Carelink Summary Report / Loop Recorder 

## 2018-01-17 ENCOUNTER — Other Ambulatory Visit (HOSPITAL_COMMUNITY): Payer: Self-pay | Admitting: Cardiology

## 2018-01-31 ENCOUNTER — Ambulatory Visit: Payer: Medicaid Other | Admitting: Cardiology

## 2018-01-31 ENCOUNTER — Encounter: Payer: Self-pay | Admitting: Cardiology

## 2018-01-31 VITALS — BP 120/88 | HR 52 | Ht 70.0 in | Wt 214.0 lb

## 2018-01-31 DIAGNOSIS — R55 Syncope and collapse: Secondary | ICD-10-CM | POA: Diagnosis not present

## 2018-01-31 DIAGNOSIS — I483 Typical atrial flutter: Secondary | ICD-10-CM | POA: Diagnosis not present

## 2018-01-31 DIAGNOSIS — I255 Ischemic cardiomyopathy: Secondary | ICD-10-CM

## 2018-01-31 DIAGNOSIS — I25810 Atherosclerosis of coronary artery bypass graft(s) without angina pectoris: Secondary | ICD-10-CM | POA: Diagnosis not present

## 2018-01-31 DIAGNOSIS — E785 Hyperlipidemia, unspecified: Secondary | ICD-10-CM | POA: Diagnosis not present

## 2018-01-31 NOTE — Patient Instructions (Signed)
Medication Instructions: 1) STOP Eliquis   2) START Aspirin 81 mg once daily  Labwork: None ordered  Procedures/Testing: None ordered  Follow-Up: Remote monitoring is used to monitor your Pacemaker of ICD from home. This monitoring reduces the number of office visits required to check your device to one time per year. It allows [place] to keep an eye on the functioning of your device to ensure it is working properly. You are scheduled for a device check from home on 02/19/18. You may send your transmission at any time that day. If you have a wireless device, the transmission will be sent automatically. After your physician reviews your transmission, you will receive a postcard with your next transmission date.    Your physician wants you to follow-up in: 9 months with Dr. Elberta Fortis.   You will receive a reminder letter in the mail two months in advance. If you don't receive a letter, please call our office to schedule the follow-up appointment.     Any Additional Special Instructions Will Be Listed Below (If Applicable).     If you need a refill on your cardiac medications before your next appointment, please call your pharmacy.

## 2018-01-31 NOTE — Progress Notes (Signed)
Electrophysiology Office Note   Date:  01/31/2018   ID:  Christopher [name], DOB [birth year], MRN [id number]  PCP:  Shirlean Mylar, MD  Cardiologist:  Shirlee Latch Primary Electrophysiologist:  Curlie Sittner Jorja Loa, MD    Chief Complaint  Patient presents with  . Pacemaker Check    AFlutter/Post ablation     History of Present Illness: Christopher [name] is a 57 y.o. male who is being seen today for the evaluation of atrial flutter, syncope at the request of [name]. Presenting today for electrophysiology evaluation.  Is a history of coronary artery disease status post CABG, ischemic cardiomyopathy, CKD.  He presented to the hospital 3/17 with inferior MI.  He is found an occluded RCA with collaterals and a 95% LAD.  He had CABG x4 with a right CEA.  He had multiple episodes of postop VT and VF and amiodarone was started.  Cath showed slow flow in the saphenous vein diagonal and saphenous vein ramus grafts.  He had prolonged mechanical ventilation and tracheostomy placement.  He had transient atrial flutter that resolved quickly.  He was admitted 4/19 for syncopal episode and found to be in atrial flutter.  Link monitor was placed and he was cardioverted back to sinus rhythm. S/p atrial flutter ablation 01/03/18.   Today, denies symptoms of palpitations, chest pain, shortness of breath, orthopnea, PND, lower extremity edema, claudication, dizziness, presyncope, syncope, bleeding, or neurologic sequela. The patient is tolerating medications without difficulties.  Overall he is feeling well.  He has noted no further episodes of atrial flutter.  He says that he is continuing to exercise walking a few miles in the mornings.  He has no major complaints today.   Past Medical History:  Diagnosis Date  . Anxiety   . Atypical atrial flutter (HCC)   . CHF (congestive heart failure) (HCC)    EF 45%  . CKD (chronic kidney disease), stage III (HCC)    Hattie Perch 03/23/2017  . Complete heart block (HCC)  10/20/2015   in setting of inferior MI, now resolved  . COPD (chronic obstructive pulmonary disease) (HCC)    pt denies this hx on 03/23/2017  . Coronary artery disease   . High cholesterol   . Hypertension   . Myocardial infarction [facility]) 10/2015   "massive one; saw scar tissue of possible previous MI maybe in 2013" (03/23/2017)  . Overweight   . Stenosis of right carotid artery   . TIA (transient ischemic attack) 2016  . Ventricular tachycardia (HCC) 10/2015   in setting of MI, now resolved   Past Surgical History:  Procedure Laterality Date  . A-FLUTTER ABLATION N/A 01/03/2018   Procedure: A-FLUTTER ABLATION;  Surgeon: Regan Lemming, MD;  Location: [facility] INVASIVE CV LAB;  Service: Cardiovascular;  Laterality: N/A;  . CARDIAC CATHETERIZATION N/A 10/20/2015   Procedure: Left Heart Cath and Coronary Angiography;  Surgeon: Tonny Bollman, MD;  [facility] INV[facility] CV LAB;  Service: Cardiovascular;  Laterality: N/A;  . CARDIAC CATHETERIZATION N/A 11/02/2015   Procedure: Right/Left Heart Cath and Coronary/Graft Angiography;  Surgeon: Laurey Morale, MD;  Location: [facility] INVASIVE CV LAB;  Service: Cardiovascular;  Laterality: N/A;  . CARDIOVERSION N/A 05/04/2017   Procedure: CARDIOVERSION;  Surgeon: Laurey Morale, MD;  Location: [facility] ENDOSCOPY;  Service: Cardiovascular;  Laterality: N/A;  . CARDIOVERSION  11/10/2017  . CARDIOVERSION N/A 11/10/2017   Procedure: CARDIOVERSION;  Surgeon: Laurey Morale, MD;  Location: [facility] ENDOSCOPY;  Service: Cardiovascular;  Laterality: N/A;  . CORONARY  ARTERY BYPASS GRAFT N/A 10/27/2015   Procedure: CORONARY ARTERY BYPASS GRAFTING (CABG) x four, using left internal mammary artery and rt leg greater saphenous vein harvested endoscopically;  Surgeon: Kerin Perna, MD;  Location: [facility] OR;  Service: Open Heart Surgery;  Laterality: N/A;  . ENDARTERECTOMY Right 10/27/2015   Procedure: RIGHT ENDARTERECTOMY CAROTID with patch angioplasty using Xenosure bovine  pericardium patch;  Surgeon: Fransisco Hertz, MD;  Location: [facility] OR;  Service: Vascular;  Laterality: Right;  . EYE MUSCLE SURGERY Left 1970s   "lazy eye"  . LACERATION REPAIR Right 2003   RUE; "fell thru plate-glass window"  . LOOP RECORDER INSERTION  11/10/2017  . LOOP RECORDER INSERTION N/A 11/10/2017   Procedure: LOOP RECORDER INSERTION;  Surgeon: Regan Lemming, MD;  Location: [facility] INVASIVE CV LAB;  Service: Cardiovascular;  Laterality: N/A;  . TEE WITHOUT CARDIOVERSION N/A 10/27/2015   Procedure: TRANSESOPHAGEAL ECHOCARDIOGRAM (TEE);  Surgeon: Kerin Perna, MD;  Location: [facility] OR;  Service: Open Heart Surgery;  Laterality: N/A;     Current Outpatient Medications  Medication Sig Dispense Refill  . acetaminophen (TYLENOL) 500 MG tablet Take 1,000 mg by mouth every 6 (six) hours as needed for mild pain or headache.     Marland Kitchen aspirin 81 MG tablet Take 81 mg by mouth daily.    . carvedilol (COREG) 3.125 MG tablet Take 1 tablet (3.125 mg total) by mouth 2 (two) times daily with a meal. 60 tablet 11  . DULoxetine (CYMBALTA) 30 MG capsule Take 30 mg by mouth daily.    Marland Kitchen gabapentin (NEURONTIN) 300 MG capsule Take 1 capsule (300 mg total) by mouth 2 (two) times daily. 60 capsule 1  . hydrALAZINE (APRESOLINE) 25 MG tablet TAKE 1 TABLET BY MOUTH EVERY 8 HOURS 90 tablet 3  . isosorbide mononitrate (IMDUR) 30 MG 24 hr tablet TAKE 1 TABLET BY MOUTH ONCE DAILY 30 tablet 3  . KLOR-CON M20 20 MEQ tablet TAKE 2 TABLETS BY MOUTH DAILY 60 tablet 6  . QUEtiapine (SEROQUEL) 50 MG tablet Take 25 mg by mouth at bedtime.   11  . ranitidine (ZANTAC) 150 MG tablet Take 150 mg by mouth 2 (two) times daily.    . rosuvastatin (CRESTOR) 40 MG tablet TAKE 1 TABLET BY MOUTH ONCE DAILY 30 tablet 11  . senna-docusate (SENOKOT-S) 8.6-50 MG tablet Take 1 tablet by mouth at bedtime. For constipation 30 tablet 0  . torsemide (DEMADEX) 20 MG tablet TAKE 2 TABLETS BY MOUTH DAILY 60 tablet 3  . spironolactone (ALDACTONE) 25 MG  tablet Take 0.5 tablets (12.5 mg total) by mouth daily. 45 tablet 3   No current facility-administered medications for this visit.     Allergies:   Losartan; Entresto [sacubitril-valsartan]; Fortaz [ceftazidime]; and Lisinopril   Social History:  The patient  reports that he quit smoking about 15 years ago. His smoking use included cigarettes. He has a 23.00 pack-year smoking history. He has never used smokeless tobacco. He reports that he has current or past drug history. Drug: Marijuana. He reports that he does not drink alcohol.   Family History:  The patient's family history includes Breast cancer in his sister; Congestive Heart Failure in his father; Failure to thrive in his father; Heart disease in his mother.    ROS:  Please see the history of present illness.   Otherwise, review of systems is positive for none.   All other systems are reviewed and negative.   PHYSICAL EXAM: VS:  BP 120/88  Pulse (!) 52   Ht 5\' 10"  (1.778 m)   Wt 214 lb (97.1 kg)   BMI 30.71 kg/m  , BMI Body mass index is 30.71 kg/m. GEN: Well nourished, well developed, in no acute distress  HEENT: normal  Neck: no JVD, carotid bruits, or masses Cardiac: RRR; no murmurs, rubs, or gallops,no edema  Respiratory:  clear to auscultation bilaterally, normal work of breathing GI: soft, nontender, nondistended, + BS MS: no deformity or atrophy  Skin: warm and dry, device site well healed Neuro:  Strength and sensation are intact Psych: euthymic mood, full affect  EKG:  EKG is ordered today. Personal review of the ekg ordered shows sinus rhythm, rate 52 inferior infarct  Personal review of the device interrogation today. Results in [facility]    Recent Labs: 03/28/2017: B Natriuretic Peptide 1,836.9 11/10/2017: ALT 19; Magnesium 2.2 01/03/2018: BUN 20; Creatinine, Ser 1.30; Hemoglobin 15.6; Platelets 169; Potassium 4.1; Sodium 143    Lipid Panel     Component Value Date/Time   CHOL 109 08/24/2017 1437   TRIG  167 (H) 08/24/2017 1437   HDL 28 (L) 08/24/2017 1437   CHOLHDL 3.9 08/24/2017 1437   VLDL 33 08/24/2017 1437   LDLCALC 48 08/24/2017 1437     Wt Readings from Last 3 Encounters:  01/31/18 214 lb (97.1 kg)  01/03/18 212 lb 8 oz (96.4 kg)  12/05/17 214 lb 12.8 oz (97.4 kg)      Other studies Reviewed: Additional studies/ records that were reviewed today include: TTE 11/10/17  Review of the above records today demonstrates:  - Left ventricle: The cavity size was mildly dilated. Systolic   function was mildly to moderately reduced. The estimated ejection   fraction was in the range of 40% to 45%. Mild diffuse   hypokinesis. There was a reduced contribution of atrial   contraction to ventricular filling, due to increased ventricular   diastolic pressure or atrial contractile dysfunction. Doppler   parameters are consistent with a reversible restrictive pattern,   indicative of decreased left ventricular diastolic compliance   and/or increased left atrial pressure (grade 3 diastolic   dysfunction). Doppler parameters are consistent with high   ventricular filling pressure. - Mitral valve: There was mild regurgitation. - Left atrium: The atrium was mildly dilated. - Pulmonary arteries: PA peak pressure: 40 mm Hg (S).   ASSESSMENT AND PLAN:  1.  Atrial flutter: On Eliquis. S/p flutter ablation 01/03/18.  No obvious recurrences from his atrial flutter.  We Jamesen Stahnke plan to stop his Eliquis today.  2.  Coronary artery disease: That is post CABG.  No current chest pain.  Continue Crestor.  Planning to stop Eliquis today.  Jiovany Scheffel restart aspirin.    3.  Chronic systolic heart failure due to ischemic cardiomyopathy.  EF 40 to 45% on most recent echo.  No signs of volume overload.  4.  Syncope: Linq monitor in place.  He did have a few nocturnal 2-1 AV block episodes, but not obviously when he was awake.  No changes.  5.  Hyperlipidemia: Continue Crestor    Current medicines are reviewed at  length with the patient today.   The patient does not have concerns regarding his medicines.  The following changes were made today: Stop Eliquis, start aspirin  Labs/ tests ordered today include:  Orders Placed This Encounter  Procedures  . EKG 12-Lead     Disposition:   FU with Elijan Googe 12 months  Signed, Bensen Chadderdon Jorja Loa, MD  01/31/2018 1:42 PM     [facility] [address] [place] [address] [phone] (office) [phone] (fax)

## 2018-02-09 ENCOUNTER — Encounter (INDEPENDENT_AMBULATORY_CARE_PROVIDER_SITE_OTHER): Payer: Self-pay

## 2018-02-15 ENCOUNTER — Other Ambulatory Visit (HOSPITAL_COMMUNITY): Payer: Self-pay | Admitting: Cardiology

## 2018-02-19 ENCOUNTER — Ambulatory Visit (INDEPENDENT_AMBULATORY_CARE_PROVIDER_SITE_OTHER): Payer: Medicaid Other | Admitting: *Deleted

## 2018-02-19 DIAGNOSIS — R55 Syncope and collapse: Secondary | ICD-10-CM

## 2018-02-19 NOTE — Progress Notes (Signed)
Carelink Summary Report / Loop Recorder 

## 2018-02-20 LAB — CUP PACEART INCLINIC DEVICE CHECK
Date Time Interrogation Session: 20190716152052
MDC IDC PG IMPLANT DT: 20190405

## 2018-02-20 LAB — CUP PACEART REMOTE DEVICE CHECK
MDC IDC PG IMPLANT DT: 20190405
MDC IDC SESS DTM: 20190610213901

## 2018-02-22 ENCOUNTER — Other Ambulatory Visit: Payer: Self-pay | Admitting: Cardiology

## 2018-02-23 ENCOUNTER — Ambulatory Visit (HOSPITAL_COMMUNITY)
Admission: RE | Admit: 2018-02-23 | Discharge: 2018-02-23 | Disposition: A | Discharge status: Home / Self Care | Payer: Medicaid Other | Source: Ambulatory Visit | Attending: Cardiology | Admitting: Cardiology

## 2018-02-23 ENCOUNTER — Other Ambulatory Visit: Payer: Self-pay

## 2018-02-23 ENCOUNTER — Encounter (HOSPITAL_COMMUNITY): Payer: Self-pay | Admitting: Cardiology

## 2018-02-23 VITALS — BP 153/84 | HR 60 | Wt 217.5 lb

## 2018-02-23 DIAGNOSIS — Z7982 Long term (current) use of aspirin: Secondary | ICD-10-CM | POA: Insufficient documentation

## 2018-02-23 DIAGNOSIS — I4892 Unspecified atrial flutter: Secondary | ICD-10-CM | POA: Insufficient documentation

## 2018-02-23 DIAGNOSIS — Z803 Family history of malignant neoplasm of breast: Secondary | ICD-10-CM | POA: Diagnosis not present

## 2018-02-23 DIAGNOSIS — E785 Hyperlipidemia, unspecified: Secondary | ICD-10-CM | POA: Insufficient documentation

## 2018-02-23 DIAGNOSIS — I2510 Atherosclerotic heart disease of native coronary artery without angina pectoris: Secondary | ICD-10-CM | POA: Diagnosis present

## 2018-02-23 DIAGNOSIS — G629 Polyneuropathy, unspecified: Secondary | ICD-10-CM | POA: Insufficient documentation

## 2018-02-23 DIAGNOSIS — I255 Ischemic cardiomyopathy: Secondary | ICD-10-CM | POA: Diagnosis not present

## 2018-02-23 DIAGNOSIS — R55 Syncope and collapse: Secondary | ICD-10-CM | POA: Diagnosis not present

## 2018-02-23 DIAGNOSIS — Z8249 Family history of ischemic heart disease and other diseases of the circulatory system: Secondary | ICD-10-CM | POA: Insufficient documentation

## 2018-02-23 DIAGNOSIS — Z79899 Other long term (current) drug therapy: Secondary | ICD-10-CM | POA: Insufficient documentation

## 2018-02-23 DIAGNOSIS — R001 Bradycardia, unspecified: Secondary | ICD-10-CM | POA: Diagnosis not present

## 2018-02-23 DIAGNOSIS — N183 Chronic kidney disease, stage 3 (moderate): Secondary | ICD-10-CM | POA: Insufficient documentation

## 2018-02-23 DIAGNOSIS — I252 Old myocardial infarction: Secondary | ICD-10-CM | POA: Diagnosis not present

## 2018-02-23 DIAGNOSIS — E669 Obesity, unspecified: Secondary | ICD-10-CM | POA: Diagnosis not present

## 2018-02-23 DIAGNOSIS — I6529 Occlusion and stenosis of unspecified carotid artery: Secondary | ICD-10-CM | POA: Insufficient documentation

## 2018-02-23 DIAGNOSIS — I739 Peripheral vascular disease, unspecified: Secondary | ICD-10-CM | POA: Diagnosis not present

## 2018-02-23 DIAGNOSIS — Z951 Presence of aortocoronary bypass graft: Secondary | ICD-10-CM | POA: Insufficient documentation

## 2018-02-23 DIAGNOSIS — I25810 Atherosclerosis of coronary artery bypass graft(s) without angina pectoris: Secondary | ICD-10-CM

## 2018-02-23 DIAGNOSIS — I483 Typical atrial flutter: Secondary | ICD-10-CM | POA: Diagnosis not present

## 2018-02-23 DIAGNOSIS — I5022 Chronic systolic (congestive) heart failure: Secondary | ICD-10-CM | POA: Insufficient documentation

## 2018-02-23 LAB — BASIC METABOLIC PANEL
Anion gap: 9 (ref 5–15)
BUN: 20 mg/dL (ref 6–20)
CALCIUM: 9.2 mg/dL (ref 8.9–10.3)
CO2: 28 mmol/L (ref 22–32)
CREATININE: 1.38 mg/dL — AB (ref 0.61–1.24)
Chloride: 105 mmol/L (ref 98–111)
GFR calc Af Amer: >60 mL/min (ref >60)
GFR, EST NON AFRICAN AMERICAN: 56 mL/min — AB (ref >60)
GLUCOSE: 92 mg/dL (ref 70–99)
Potassium: 4.2 mmol/L (ref 3.5–5.1)
Sodium: 142 mmol/L (ref 135–145)

## 2018-02-23 LAB — LIPID PANEL
Cholesterol: 184 mg/dL (ref 0–200)
HDL: 39 mg/dL — ABNORMAL LOW (ref >40)
LDL CALC: 108 mg/dL — AB (ref 0–99)
Total CHOL/HDL Ratio: 4.7 RATIO
Triglycerides: 186 mg/dL — ABNORMAL HIGH (ref <150)
VLDL: 37 mg/dL (ref 0–40)

## 2018-02-23 MED ORDER — POTASSIUM CHLORIDE CRYS ER 20 MEQ PO TBCR: 20.0000 meq | EXTENDED_RELEASE_TABLET | Freq: Every day | ORAL | 3 refills | Status: DC

## 2018-02-23 MED ORDER — SPIRONOLACTONE 25 MG PO TABS: 25.0000 mg | ORAL_TABLET | Freq: Every day | ORAL | 3 refills | Status: DC

## 2018-02-23 NOTE — Patient Instructions (Signed)
EKG today.  Routine lab work today. Will notify you of abnormal results, otherwise no news is good news!  INCREASE Spironolactone to 25 mg (1 whole tablet) once daily.  DECREASE Potassium to 20 meq (1 tablet) once daily.  Follow up 2 weeks for lab work.  ___________________________________________________________ Christopher [name] Code:  Follow up 4 months with Dr. Shirlee [name]. We will call you closer to this time, or you may call our office to schedule 1 month before you are due to be seen. Take all medication as prescribed the day of your appointment. Bring all medications with you to your appointment.  Do the following things EVERYDAY: 1) Weigh yourself in the morning before breakfast. Write it down and keep it in a log. 2) Take your medicines as prescribed 3) Eat low salt foods-Limit salt (sodium) to 2000 mg per day.  4) Stay as active as you can everyday 5) Limit all fluids for the day to less than 2 liters

## 2018-02-25 NOTE — Progress Notes (Signed)
Patient ID: Christopher [name], male   DOB: 1961-02-20, 57 y.o.   MRN: [id number]   PCP: Dr. Hyman Hopes Cardiology: Dr. Shirlee Latch Vascular: Dr Early  57 y.o. with history of CAD s/p CABG, ischemic cardiomyopathy, and CKD presents for followup of CHF and atrial fibrillation.  In 3/17, he came to the hospital with a late presentation inferior MI.  He was noted to have occluded RCA with collaterals and 95% proximal LAD.  He had CABG x 4 + right CEA.  He had a balloon pump with cardiogenic shock.  He additionally had multiple episodes of post-op VT/VF.  Amiodarone was started.  Repeat cath showed slow flow in the SVG-D and SVG-ramus given diffusely diseased and small distal targets.  He developed AKI.  He had prolonged mechanical ventilation and a tracheostomy.  He had RV failure that was treated with Revatio.  He had transient atrial flutter that resolved quickly.  Last echo in 3/17 showed EF 30-35%.  Finally, the tracheostomy was removed.  He went to inpatient rehab for about a week and is now home again.  Echo 6/17 with EF 40-45%.   Admitted 8/21-8/27/18 with acute respiratory failure due to HCAP and HTN emergency. Also had rapid atrial flutter. Rate improved and DCCV was planned for outpatient as he had missed a few doses of Eliquis at home. Discharge weight was 270 pounds. He had DCCV to NSR in 9/18.  He was admitted again in 4/19 after syncopal episode and was found to be in atrial flutter again.  He had a LINQ monitor placed and was cardioverted back to NSR.   Atrial flutter ablation in 5/19.  He is now off Eliquis.   He is in NSR today.  He is overall feeling good.  Coreg was decreased to 3.125 mg bid because of bradycardia.   He can walk > 3 miles now with no dyspnea or chest pain.  He has some pain in his calves after walking 1/2 mile but he can walk through the pain and it mostly resolves.    ECG: NSR, left axis deviation, old inferior MI, old ASMI, personally reviewed  Labs (4/17): K 3.4, creatinine  1.6 Labs (5/17): K 4.4, creatinine 1.65, LFTs normal, TSH mildly elevated, free T3 and free T4 normal, BNP 502, hemoglobin 12.3 Labs (6/17): BNP 737, K 4.4, creatinine 1.72 Labs (7/17): LDL 136, K 4.9, creatinine 1.79 Labs (8/17): BNP 239, K 3.8, creatinine 2.17 Labs (10/17): LDL 74, HDL 34, LFTs normal Labs (4/09): K 5, creatinine 1.67, BNP 774 Labs (6/18): K 3.3, creatinine 1.82 Labs 02/06/2017: K 4.3 Creatinine 1.78.  Labs (9/18): K 4.1, creatinine 1.73 Labs (1/19): LDL 48 Labs (4/19): K 4, creatinine 1.38, hgb 15.3  PMH: 1. CAD: Late presentation inferior MI in 3/17.  - LHC showed totally occluded pRCA with L=>R collaterals, 95% pLAD, 70% ramus.   - CABG with LIMA-LAD, SVG-D, SVG-ramus, SVG-RCA.  - Repeat echo in 3/17 after CABG showed patent LIMA and SVG-RCA but SVG-D and SVG- ramus had slow flow with small, diffusely diseased distal targets.  - Echo (6/17) with EF 40-45%, inferior severe hypokinesis, mildly dilated RV with normal systolic function, PASP 30 mmHg.  2. Chronic systolic CHF: Ischemic cardiomyopathy.   - Echo (3/17) with EF 30-35%, septal and inferior AK.   - Echo (7/18): EF 40-45%, moderate FBSH, grade II diastolic dysfunction, RV mildly dilated with normal systolic function.  - Echo (4/19): EF 40-45%, mild LV dilation 3. RV failure: was on Revatio, now off.  4. CKD: Stage 3.  5. Atrial flutter: Transient, post-op.   - DCCV to NSR in 9/18. - DCCV to NSR in 4/19.   6. VT: Post-op CABG.  7. Carotid stenosis: Right CEA 3/17.  - Carotid dopplers (9/18): 1-39% LICA, s/p R CEA.  8. PAD: Peripheral artery dopplers (7/17) with > 50% L CIA stenosis, occluded distal external iliac, occluded right SFA, occluded left SFA.  9. Peripheral neuropathy 10. Syncope: 4/19, now has LINQ monitor.   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   Family History  Problem Relation Age of Onset  . Heart disease Mother   . Failure to thrive Father   .  Congestive Heart Failure Father        Deceased  . Breast cancer Sister        Living   Review of systems complete and found to be negative unless listed in HPI.   Current Outpatient Medications  Medication Sig Dispense Refill  . acetaminophen (TYLENOL) 500 MG tablet Take 1,000 mg by mouth every 6 (six) hours as needed for mild pain or headache.     Marland Kitchen aspirin 81 MG tablet Take 81 mg by mouth daily.    . carvedilol (COREG) 3.125 MG tablet Take 1 tablet (3.125 mg total) by mouth 2 (two) times daily with a meal. 60 tablet 11  . DULoxetine (CYMBALTA) 30 MG capsule Take 30 mg by mouth daily.    Marland Kitchen gabapentin (NEURONTIN) 300 MG capsule Take 1 capsule (300 mg total) by mouth 2 (two) times daily. 60 capsule 1  . hydrALAZINE (APRESOLINE) 25 MG tablet TAKE 1 TABLET BY MOUTH EVERY 8 HOURS 90 tablet 3  . isosorbide mononitrate (IMDUR) 30 MG 24 hr tablet TAKE 1 TABLET BY MOUTH ONCE DAILY 30 tablet 3  . potassium chloride SA (K-DUR,KLOR-CON) 20 MEQ tablet Take 1 tablet (20 mEq total) by mouth daily. 90 tablet 3  . QUEtiapine (SEROQUEL) 50 MG tablet Take 25 mg by mouth at bedtime.   11  . ranitidine (ZANTAC) 150 MG tablet Take 150 mg by mouth 2 (two) times daily.    . rosuvastatin (CRESTOR) 40 MG tablet TAKE 1 TABLET BY MOUTH ONCE DAILY 30 tablet 11  . senna-docusate (SENOKOT-S) 8.6-50 MG tablet Take 1 tablet by mouth at bedtime. For constipation 30 tablet 0  . spironolactone (ALDACTONE) 25 MG tablet Take 1 tablet (25 mg total) by mouth daily. 90 tablet 3  . torsemide (DEMADEX) 20 MG tablet TAKE 2 TABLETS BY MOUTH DAILY 60 tablet 3   No current facility-administered medications for this encounter.    Vitals:   02/23/18 1059  BP: (!) 153/84  Pulse: 60  SpO2: 99%  Weight: 217 lb 8 oz (98.7 kg)    Wt Readings from Last 3 Encounters:  02/23/18 217 lb 8 oz (98.7 kg)  01/31/18 214 lb (97.1 kg)  01/03/18 212 lb 8 oz (96.4 kg)    Physical Exam General: NAD Neck: No JVD, no thyromegaly or thyroid  nodule.  Lungs: Clear to auscultation bilaterally with normal respiratory effort. CV: Nondisplaced PMI.  Heart regular S1/S2, no S3/S4, no murmur.  No peripheral edema.  No carotid bruit.  Unable to palpate pedal pulses.  Abdomen: Soft, nontender, no hepatosplenomegaly, no distention.  Skin: Intact without lesions or rashes.  Neurologic: Alert and oriented x 3.  Psych: Normal affect. Extremities: No clubbing or cyanosis.  HEENT: Normal.   Assessment/Plan: 1. CAD: s/p CABG. No chest pain.    -  Continue Crestor.  - Continue ASA 81 daily.   2. Atrial flutter: Initially occurred post operatively and was transient. Noted to be in Aflutter at office visit in 7/18 and again at hospitalization in 8/18. He had DCCV in 9/18.  He saw Dr. Johney Frame but wanted to hold off on flutter ablation.  However, he had atrial flutter again in 4/19 and was cardioverted.  He is now s/p atrial flutter ablation and is in NSR.  He is now off Eliquis.  3. Chronic systolic CHF: Ischemic cardiomyopathy.  Echo in 3/17 with EF 30-35%, improved to 40-45% on echo in 6/17.  Echo 02/2017 EF 40-45%, echo 4/19 with EF 40-45%.  NYHA II, not volume overloaded on exam.   - Continue Coreg 3.125 mg bid (decreased due to bradycardia).   - Continue Hydralazine 25 mg TID  - Continue Imdur 30 daily.  - Continue torsemide 40 mg daily. - Increase spironolactone to 25 mg daily with BMET today and again in 10 days. Decrease KCl to 20 daily.   - EF above ICD range.  - He had AKI with ARB and with Entresto.   5. VT: No further.  - Off amiodarone.  6. CKD stage III: BMET today.  7. Carotid stenosis: s/p R CEA in 2017, follows with [facility].  8. Hyperlipidemia: Continue Crestor.   9. PAD: Claudication improved with walking program. - Follows with [facility].  10. Obesity: Much improved with diet/exercise.  11. Syncope: No has LINQ monitor.  No events.    Followup 4 months.    [name], MD  02/25/2018

## 2018-02-27 ENCOUNTER — Telehealth (HOSPITAL_COMMUNITY): Payer: Self-pay | Admitting: *Deleted

## 2018-02-27 DIAGNOSIS — I5022 Chronic systolic (congestive) heart failure: Secondary | ICD-10-CM

## 2018-02-27 MED ORDER — EZETIMIBE 10 MG PO TABS: 10.0000 mg | ORAL_TABLET | Freq: Every day | ORAL | 3 refills | Status: DC

## 2018-02-27 NOTE — Telephone Encounter (Signed)
Result Notes for Basic metabolic panel   Notes recorded by Georgina Peer, RN on 02/27/2018 at 9:16 AM EDT Called and spoke with patient, he has been taking Crestor 40 mg Daily. Zetia sent to preferred pharmacy, labs ordered, and lab appt scheduled. ------  Notes recorded by Georgina Peer, RN on 02/26/2018 at 9:46 AM EDT Called and left message for patient to call us back. ------  Notes recorded by Laurey Morale, MD on 02/25/2018 at 11:36 PM EDT Labs ok except for LDL which is up. He is taking Crestor 40 mg daily? If so, needs to start on Zetia 10 mg daily with lipids/LFTs in 2 months. ------  Notes recorded by Noralee Space, RN on 02/23/2018 at 3:26 PM EDT Labs reviewed by RN, will forward to MD for review

## 2018-02-28 ENCOUNTER — Telehealth: Payer: Self-pay | Admitting: *Deleted

## 2018-02-28 NOTE — Telephone Encounter (Signed)
Spoke with patient regarding brady episode recorded by ILR from 02/27/18 at 0303.  Patient reports he was definitely sleeping at the time and was asymptomatic.  He denies any recent syncope or presyncope.  He reports he is feeling great.  Still taking all meds as prescribed, including carvedilol 3.125mg  BID.  Advised patient that Dr. Elberta Fortis will review ECG and we will call him back if any recommendations.  Patient is agreeable to plan and appreciative of call.

## 2018-03-05 ENCOUNTER — Ambulatory Visit (HOSPITAL_COMMUNITY)
Admission: RE | Admit: 2018-03-05 | Discharge: 2018-03-05 | Disposition: A | Discharge status: Home / Self Care | Payer: Medicaid Other | Source: Ambulatory Visit | Attending: Internal Medicine | Admitting: Internal Medicine

## 2018-03-05 DIAGNOSIS — I5022 Chronic systolic (congestive) heart failure: Secondary | ICD-10-CM

## 2018-03-05 LAB — BASIC METABOLIC PANEL
ANION GAP: 9 (ref 5–15)
BUN: 24 mg/dL — ABNORMAL HIGH (ref 6–20)
CALCIUM: 9.9 mg/dL (ref 8.9–10.3)
CO2: 30 mmol/L (ref 22–32)
Chloride: 102 mmol/L (ref 98–111)
Creatinine, Ser: 1.49 mg/dL — ABNORMAL HIGH (ref 0.61–1.24)
GFR, EST AFRICAN AMERICAN: 59 mL/min — AB (ref >60)
GFR, EST NON AFRICAN AMERICAN: 51 mL/min — AB (ref >60)
Glucose, Bld: 80 mg/dL (ref 70–99)
Potassium: 4.1 mmol/L (ref 3.5–5.1)
Sodium: 141 mmol/L (ref 135–145)

## 2018-03-22 ENCOUNTER — Ambulatory Visit (INDEPENDENT_AMBULATORY_CARE_PROVIDER_SITE_OTHER): Payer: Medicaid Other | Admitting: *Deleted

## 2018-03-22 DIAGNOSIS — R55 Syncope and collapse: Secondary | ICD-10-CM | POA: Diagnosis not present

## 2018-03-22 DIAGNOSIS — I5022 Chronic systolic (congestive) heart failure: Secondary | ICD-10-CM

## 2018-03-23 NOTE — Progress Notes (Signed)
Carelink Summary Report / Loop Recorder 

## 2018-03-28 ENCOUNTER — Other Ambulatory Visit: Payer: Self-pay | Admitting: Cardiology

## 2018-04-03 LAB — CUP PACEART REMOTE DEVICE CHECK
Implantable Pulse Generator Implant Date: 20190405
MDC IDC SESS DTM: 20190713220608

## 2018-04-10 ENCOUNTER — Other Ambulatory Visit (HOSPITAL_COMMUNITY): Payer: Self-pay | Admitting: Cardiology

## 2018-04-24 ENCOUNTER — Ambulatory Visit (INDEPENDENT_AMBULATORY_CARE_PROVIDER_SITE_OTHER): Payer: Medicaid Other | Admitting: *Deleted

## 2018-04-24 ENCOUNTER — Ambulatory Visit: Payer: Medicaid Other | Admitting: Family

## 2018-04-24 ENCOUNTER — Ambulatory Visit (HOSPITAL_COMMUNITY): Payer: Medicaid Other

## 2018-04-24 ENCOUNTER — Encounter (HOSPITAL_COMMUNITY): Payer: Self-pay

## 2018-04-24 DIAGNOSIS — R55 Syncope and collapse: Secondary | ICD-10-CM

## 2018-04-24 NOTE — Progress Notes (Signed)
[employer] disability/FMLA on behalf of spouse completed and faxed to provided # [phone] Copy of forms scanned into patient's electronic medical record under media tab for reference.  Ave Filter, RN

## 2018-04-25 NOTE — Progress Notes (Signed)
Carelink Summary Report / Loop Recorder 

## 2018-04-30 ENCOUNTER — Ambulatory Visit (HOSPITAL_COMMUNITY)
Admission: RE | Admit: 2018-04-30 | Discharge: 2018-04-30 | Disposition: A | Discharge status: Home / Self Care | Payer: Medicaid Other | Source: Ambulatory Visit | Attending: Internal Medicine | Admitting: Internal Medicine

## 2018-04-30 DIAGNOSIS — I5022 Chronic systolic (congestive) heart failure: Secondary | ICD-10-CM | POA: Insufficient documentation

## 2018-04-30 LAB — HEPATIC FUNCTION PANEL
ALK PHOS: 75 U/L (ref 38–126)
ALT: 23 U/L (ref 0–44)
AST: 28 U/L (ref 15–41)
Albumin: 4.2 g/dL (ref 3.5–5.0)
BILIRUBIN DIRECT: 0.2 mg/dL (ref 0.0–0.2)
BILIRUBIN INDIRECT: 0.5 mg/dL (ref 0.3–0.9)
TOTAL PROTEIN: 7.3 g/dL (ref 6.5–8.1)
Total Bilirubin: 0.7 mg/dL (ref 0.3–1.2)

## 2018-04-30 LAB — CUP PACEART REMOTE DEVICE CHECK
MDC IDC PG IMPLANT DT: 20190405
MDC IDC SESS DTM: 20190815220907

## 2018-04-30 LAB — LIPID PANEL
CHOL/HDL RATIO: 3.5 ratio
Cholesterol: 117 mg/dL (ref 0–200)
HDL: 33 mg/dL — ABNORMAL LOW (ref >40)
LDL CALC: 30 mg/dL (ref 0–99)
Triglycerides: 269 mg/dL — ABNORMAL HIGH (ref <150)
VLDL: 54 mg/dL — AB (ref 0–40)

## 2018-05-06 LAB — CUP PACEART REMOTE DEVICE CHECK
Date Time Interrogation Session: 20190917223802
Implantable Pulse Generator Implant Date: 20190405

## 2018-05-10 ENCOUNTER — Other Ambulatory Visit (HOSPITAL_COMMUNITY): Payer: Self-pay | Admitting: Cardiology

## 2018-05-10 ENCOUNTER — Other Ambulatory Visit (HOSPITAL_COMMUNITY): Payer: Self-pay | Admitting: Internal Medicine

## 2018-05-10 DIAGNOSIS — I509 Heart failure, unspecified: Secondary | ICD-10-CM

## 2018-05-15 ENCOUNTER — Other Ambulatory Visit (HOSPITAL_COMMUNITY): Payer: Self-pay | Admitting: Cardiology

## 2018-05-21 ENCOUNTER — Ambulatory Visit: Payer: Medicaid Other | Admitting: Family

## 2018-05-21 ENCOUNTER — Encounter (HOSPITAL_COMMUNITY): Payer: Medicaid Other

## 2018-05-24 ENCOUNTER — Ambulatory Visit (HOSPITAL_COMMUNITY)
Admission: RE | Admit: 2018-05-24 | Discharge: 2018-05-24 | Disposition: A | Discharge status: Home / Self Care | Payer: Medicaid Other | Source: Ambulatory Visit | Attending: Vascular Surgery | Admitting: Vascular Surgery

## 2018-05-24 ENCOUNTER — Encounter: Payer: Self-pay | Admitting: Family

## 2018-05-24 ENCOUNTER — Ambulatory Visit (INDEPENDENT_AMBULATORY_CARE_PROVIDER_SITE_OTHER): Payer: Medicaid Other | Admitting: Family

## 2018-05-24 VITALS — BP 138/90 | HR 66 | Temp 97.4°F | Ht 72.0 in | Wt 218.0 lb

## 2018-05-24 DIAGNOSIS — F17200 Nicotine dependence, unspecified, uncomplicated: Secondary | ICD-10-CM

## 2018-05-24 DIAGNOSIS — I779 Disorder of arteries and arterioles, unspecified: Secondary | ICD-10-CM

## 2018-05-24 DIAGNOSIS — Z9889 Other specified postprocedural states: Secondary | ICD-10-CM | POA: Diagnosis not present

## 2018-05-24 DIAGNOSIS — I739 Peripheral vascular disease, unspecified: Secondary | ICD-10-CM | POA: Diagnosis not present

## 2018-05-24 DIAGNOSIS — I6523 Occlusion and stenosis of bilateral carotid arteries: Secondary | ICD-10-CM

## 2018-05-24 NOTE — Patient Instructions (Signed)

## 2018-05-24 NOTE — Progress Notes (Signed)
Chief Complaint: Follow up Extracranial Carotid Artery Stenosis   History of Present Illness  Christopher [name] is a 57 y.o. male who is s/p  R CEA with BPA (10/27/15) by Dr. Imogene Burn, combined CABG.  Pt was lost to follow-up for a while due to complicated recovery.  He denies any known history of stroke or TIA. Specifically he denies a history of amaurosis fugax or monocular blindness, unilateral facial drooping, hemiplegia, or receptive or expressive aphasia.     Dr. Imogene Burn last evaluated pt on 04-21-17. At that time pt remained asymptomatic of stroke or TIA, right ICA remained with no restenosis, left ICA had 1-39% stenosis. Dr. Imogene Burn advised follow up in 1 year with B carotid duplex.  He lost 60 pounds intentionally, walks 3.5 miles daily; initially he was not able to walk 50 yards; he gradually increased his stamina.   He has a Loop recorder, had a cardiac ablation in May 2019 for atrial flutter, has had no more syncopal episodes since then.   Pt states he was told by Dr. Arbie Cookey that he has significant blockage in his left iliac artery.  He reports bilateral calf pain, left worse than right, after walking about 1/4 mile, which resolves with more walking.   Diabetic: no Tobacco use: former smoker, quit in 2005, smoked x 20 years . However, he admits to smoking marijuana, states he has no intent to quit.   Pt meds include: Statin : yes ASA: yes Other anticoagulants/antiplatelets: no   Past Medical History:  Diagnosis Date  . Anxiety   . Atypical atrial flutter (HCC)   . CHF (congestive heart failure) (HCC)    EF 45%  . CKD (chronic kidney disease), stage III (HCC)    Hattie Perch 03/23/2017  . Complete heart block (HCC) 10/20/2015   in setting of inferior MI, now resolved  . COPD (chronic obstructive pulmonary disease) (HCC)    pt denies this hx on 03/23/2017  . Coronary artery disease   . High cholesterol   . Hypertension   . Myocardial infarction [facility]) 10/2015   "massive one; saw  scar tissue of possible previous MI maybe in 2013" (03/23/2017)  . Overweight   . Stenosis of right carotid artery   . TIA (transient ischemic attack) 2016  . Ventricular tachycardia (HCC) 10/2015   in setting of MI, now resolved    Social History Social History   Tobacco Use  . Smoking status: Former Smoker    Packs/day: 1.00    Years: 23.00    Pack years: 23.00    Types: Cigarettes    Last attempt to quit: 2004    Years since quitting: 15.8  . Smokeless tobacco: Never Used  Substance Use Topics  . Alcohol use: No    Alcohol/week: 0.0 standard drinks    Comment: Previously drank 24 beer/daily for 27 years, but has been sober since January 08, 2002  . Drug use: Yes    Types: Marijuana    Comment: 03/23/2017 "daily"    Family History Family History  Problem Relation Age of Onset  . Heart disease Mother   . Failure to thrive Father   . Congestive Heart Failure Father        Deceased  . Breast cancer Sister        Living    Surgical History Past Surgical History:  Procedure Laterality Date  . A-FLUTTER ABLATION N/A 01/03/2018   Procedure: A-FLUTTER ABLATION;  Surgeon: Regan Lemming, MD;  Location: [facility]  INVASIVE CV LAB;  Service: Cardiovascular;  Laterality: N/A;  . CARDIAC CATHETERIZATION N/A 10/20/2015   Procedure: Left Heart Cath and Coronary Angiography;  Surgeon: Tonny Bollman, MD;  Location: [facility] INVASIVE CV LAB;  Service: Cardiovascular;  Laterality: N/A;  . CARDIAC CATHETERIZATION N/A 11/02/2015   Procedure: Right/Left Heart Cath and Coronary/Graft Angiography;  Surgeon: Laurey Morale, MD;  Location: [facility] INVASIVE CV LAB;  Service: Cardiovascular;  Laterality: N/A;  . CARDIOVERSION N/A 05/04/2017   Procedure: CARDIOVERSION;  Surgeon: Laurey Morale, MD;  Location: [facility] ENDOSCOPY;  Service: Cardiovascular;  Laterality: N/A;  . CARDIOVERSION  11/10/2017  . CARDIOVERSION N/A 11/10/2017   Procedure: CARDIOVERSION;  Surgeon: Laurey Morale, MD;  Location: [facility] ENDOSCOPY;   Service: Cardiovascular;  Laterality: N/A;  . CORONARY ARTERY BYPASS GRAFT N/A 10/27/2015   Procedure: CORONARY ARTERY BYPASS GRAFTING (CABG) x four, using left internal mammary artery and rt leg greater saphenous vein harvested endoscopically;  Surgeon: Kerin Perna, MD;  Location: [facility] OR;  Service: Open Heart Surgery;  Laterality: N/A;  . ENDARTERECTOMY Right 10/27/2015   Procedure: RIGHT ENDARTERECTOMY CAROTID with patch angioplasty using Xenosure bovine pericardium patch;  Surgeon: Fransisco Hertz, MD;  Location: [facility] OR;  Service: Vascular;  Laterality: Right;  . EYE MUSCLE SURGERY Left 1970s   "lazy eye"  . LACERATION REPAIR Right 2003   RUE; "fell thru plate-glass window"  . LOOP RECORDER INSERTION  11/10/2017  . LOOP RECORDER INSERTION N/A 11/10/2017   Procedure: LOOP RECORDER INSERTION;  Surgeon: Regan Lemming, MD;  Location: [facility] INVASIVE CV LAB;  Service: Cardiovascular;  Laterality: N/A;  . TEE WITHOUT CARDIOVERSION N/A 10/27/2015   Procedure: TRANSESOPHAGEAL ECHOCARDIOGRAM (TEE);  Surgeon: Kerin Perna, MD;  Location: [facility] OR;  Service: Open Heart Surgery;  Laterality: N/A;    Allergies  Allergen Reactions  . Losartan Shortness Of Breath  . Entresto [Sacubitril-Valsartan] Other (See Comments)    Kidney function  . Elita Quick [Ceftazidime] Rash and Other (See Comments)    Rash with blisters  . Lisinopril Rash    Current Outpatient Medications  Medication Sig Dispense Refill  . acetaminophen (TYLENOL) 500 MG tablet Take 1,000 mg by mouth every 6 (six) hours as needed for mild pain or headache.     Marland Kitchen aspirin 81 MG tablet Take 81 mg by mouth daily.    . carvedilol (COREG) 3.125 MG tablet Take 1 tablet (3.125 mg total) by mouth 2 (two) times daily with a meal. 60 tablet 11  . DULoxetine (CYMBALTA) 30 MG capsule Take 30 mg by mouth daily.    Marland Kitchen ezetimibe (ZETIA) 10 MG tablet Take 1 tablet (10 mg total) by mouth daily. 90 tablet 3  . gabapentin (NEURONTIN) 300 MG capsule Take 1  capsule (300 mg total) by mouth 2 (two) times daily. 60 capsule 1  . hydrALAZINE (APRESOLINE) 25 MG tablet TAKE 1 TABLET BY MOUTH EVERY 8 HOURS 90 tablet 3  . isosorbide mononitrate (IMDUR) 30 MG 24 hr tablet TAKE 1 TABLET BY MOUTH ONCE DAILY 30 tablet 5  . potassium chloride SA (K-DUR,KLOR-CON) 20 MEQ tablet Take 1 tablet (20 mEq total) by mouth daily. 90 tablet 3  . QUEtiapine (SEROQUEL) 50 MG tablet Take 25 mg by mouth at bedtime.   11  . ranitidine (ZANTAC) 150 MG tablet Take 150 mg by mouth 2 (two) times daily.    . rosuvastatin (CRESTOR) 40 MG tablet TAKE 1 TABLET BY MOUTH ONCE DAILY 90 tablet 1  . senna-docusate (SENOKOT-S) 8.6-50  MG tablet Take 1 tablet by mouth at bedtime. For constipation 30 tablet 0  . spironolactone (ALDACTONE) 25 MG tablet Take 1 tablet (25 mg total) by mouth daily. 90 tablet 3  . torsemide (DEMADEX) 20 MG tablet TAKE 2 TABLETS BY MOUTH ONCE DAILY 180 tablet 1   No current facility-administered medications for this visit.     Review of Systems : See HPI for pertinent positives and negatives.  Physical Examination  Vitals:   05/24/18 0942 05/24/18 0943  BP: (!) 142/95 138/90  Pulse: 66   Temp: (!) 97.4 F (36.3 C)   TempSrc: Oral   SpO2: 98%   Weight: 218 lb (98.9 kg)   Height: 6' (1.829 m)    Body mass index is 29.57 kg/m.  General: WDWN male in NAD GAIT: normal Eyes: PERRLA HENT: No gross abnormalities.  Pulmonary:  Respirations are non-labored, good air movement in all fields, CTAB, rales, rhonchi, r wheezes.  Cardiac: regular rhythm, no detected murmur.  VASCULAR EXAM Carotid Bruits Right Left   Negative Negative     Abdominal aortic pulse is not palpable. Radial pulses are 2+ palpable and equal.                                                                                                                            LE Pulses Right Left       FEMORAL  not palpable  not palpable        POPLITEAL  not palpable   not palpable        POSTERIOR TIBIAL  not palpable   not palpable        DORSALIS PEDIS      ANTERIOR TIBIAL not palpable  not palpable     Gastrointestinal: soft, nontender, BS WNL, no r/g, no palpable masses. Musculoskeletal: no muscle atrophy/wasting. M/S 5/5 throughout, extremities without ischemic changes. Skin: No rashes, no ulcers, no cellulitis.   Neurologic:  A&O X 3; appropriate affect, sensation is normal; speech is normal, CN 2-12 intact, pain and light touch intact in extremities, motor exam as listed above. Psychiatric: Normal thought content, mood appropriate to clinical situation.    Assessment: Christopher [name] is a 57 y.o. male who is s/p R CEA with BPA (10/27/15) by Dr. Imogene Burn, combined CABG. He has no history of stroke or TIA.   Fortunately he does not have DM and quit smoking cigarettes in 2005, but admits to currently smoking marijuana with no intent to quit. His atherosclerotic risk factors include current smoking, CAD with hx of MI and CABG, COPD, stage 3a CKD (GFR of 51), hypertension and dyslipidemia. Fortunately he walks 3.5 miles daily.   He has known left iliac artery occlusive disease, occluded right mid SFA with reconstitution via collateral flow in the right distal SFA, occluded left CFA with left PFA feeding the left SFA. Three vessel run-off, bilaterally.   DATA  Carotid Duplex (05-24-18): Right ICA: CEA site with no restenosis  Left ICA: 1-39% stenosis. Bilateral vertebral artery flow is antegrade.  Bilateral subclavian artery waveforms are normal.  No change compared to the exam on 04-21-17.  Bilateral LE Arterial Duplex (02-17-16), requested by Dr. Shirlee Latch: Aorto-iliac atherosclerosis. Normal velocities in the right common and external iliac arteries. >50% left common iliac artery stenosis. Occluded left distal external iliac artery. Occluded right mid SFA with reconstitution via collateral flow in the right distal SFA. Occluded left CFA, with left PFA feeding  the left SFA. Three vessel run-off, bilaterally.   Plan: Follow-up in 1 year with Carotid Duplex scan.  I encouraged him to continue his extensive daily walking.     I discussed in depth with the patient the nature of atherosclerosis, and emphasized the importance of maximal medical management including strict control of blood pressure, blood glucose, and lipid levels, obtaining regular exercise, and cessation of smoking.  The patient is aware that without maximal medical management the underlying atherosclerotic disease process will progress, limiting the benefit of any interventions. The patient was given information about stroke prevention and what symptoms should prompt the patient to seek immediate medical care. Thank you for allowing [place] to participate in this patient's care.  Charisse March, RN, MSN, FNP-C [facility] Office: [phone]  Clinic Physician: Darrick Penna  05/24/18 9:51 AM

## 2018-05-28 ENCOUNTER — Ambulatory Visit (INDEPENDENT_AMBULATORY_CARE_PROVIDER_SITE_OTHER): Payer: Medicaid Other | Admitting: *Deleted

## 2018-05-28 DIAGNOSIS — R55 Syncope and collapse: Secondary | ICD-10-CM

## 2018-06-15 LAB — CUP PACEART REMOTE DEVICE CHECK
Date Time Interrogation Session: 20191020233940
Implantable Pulse Generator Implant Date: 20190405

## 2018-06-25 ENCOUNTER — Encounter (HOSPITAL_COMMUNITY): Payer: Self-pay | Admitting: Cardiology

## 2018-06-25 ENCOUNTER — Ambulatory Visit (HOSPITAL_COMMUNITY)
Admission: RE | Admit: 2018-06-25 | Discharge: 2018-06-25 | Disposition: A | Discharge status: Home / Self Care | Payer: Medicaid Other | Source: Ambulatory Visit | Attending: Cardiology | Admitting: Cardiology

## 2018-06-25 VITALS — BP 118/69 | HR 69 | Wt 222.6 lb

## 2018-06-25 DIAGNOSIS — I483 Typical atrial flutter: Secondary | ICD-10-CM | POA: Diagnosis not present

## 2018-06-25 DIAGNOSIS — Z951 Presence of aortocoronary bypass graft: Secondary | ICD-10-CM | POA: Insufficient documentation

## 2018-06-25 DIAGNOSIS — I25810 Atherosclerosis of coronary artery bypass graft(s) without angina pectoris: Secondary | ICD-10-CM | POA: Diagnosis not present

## 2018-06-25 DIAGNOSIS — Z8249 Family history of ischemic heart disease and other diseases of the circulatory system: Secondary | ICD-10-CM | POA: Insufficient documentation

## 2018-06-25 DIAGNOSIS — I6529 Occlusion and stenosis of unspecified carotid artery: Secondary | ICD-10-CM | POA: Diagnosis not present

## 2018-06-25 DIAGNOSIS — E669 Obesity, unspecified: Secondary | ICD-10-CM | POA: Insufficient documentation

## 2018-06-25 DIAGNOSIS — I739 Peripheral vascular disease, unspecified: Secondary | ICD-10-CM | POA: Insufficient documentation

## 2018-06-25 DIAGNOSIS — I5022 Chronic systolic (congestive) heart failure: Secondary | ICD-10-CM | POA: Diagnosis not present

## 2018-06-25 DIAGNOSIS — Z7982 Long term (current) use of aspirin: Secondary | ICD-10-CM | POA: Insufficient documentation

## 2018-06-25 DIAGNOSIS — Z803 Family history of malignant neoplasm of breast: Secondary | ICD-10-CM | POA: Diagnosis not present

## 2018-06-25 DIAGNOSIS — I4892 Unspecified atrial flutter: Secondary | ICD-10-CM | POA: Diagnosis not present

## 2018-06-25 DIAGNOSIS — I2510 Atherosclerotic heart disease of native coronary artery without angina pectoris: Secondary | ICD-10-CM | POA: Diagnosis not present

## 2018-06-25 DIAGNOSIS — Z79899 Other long term (current) drug therapy: Secondary | ICD-10-CM | POA: Diagnosis not present

## 2018-06-25 DIAGNOSIS — G629 Polyneuropathy, unspecified: Secondary | ICD-10-CM | POA: Insufficient documentation

## 2018-06-25 DIAGNOSIS — I255 Ischemic cardiomyopathy: Secondary | ICD-10-CM | POA: Insufficient documentation

## 2018-06-25 DIAGNOSIS — I252 Old myocardial infarction: Secondary | ICD-10-CM | POA: Diagnosis not present

## 2018-06-25 DIAGNOSIS — R55 Syncope and collapse: Secondary | ICD-10-CM | POA: Diagnosis not present

## 2018-06-25 DIAGNOSIS — N183 Chronic kidney disease, stage 3 (moderate): Secondary | ICD-10-CM | POA: Insufficient documentation

## 2018-06-25 DIAGNOSIS — Z6830 Body mass index (BMI) 30.0-30.9, adult: Secondary | ICD-10-CM | POA: Diagnosis not present

## 2018-06-25 DIAGNOSIS — E785 Hyperlipidemia, unspecified: Secondary | ICD-10-CM | POA: Diagnosis not present

## 2018-06-25 LAB — BASIC METABOLIC PANEL
Anion gap: 8 (ref 5–15)
BUN: 21 mg/dL — ABNORMAL HIGH (ref 6–20)
CHLORIDE: 101 mmol/L (ref 98–111)
CO2: 27 mmol/L (ref 22–32)
CREATININE: 1.43 mg/dL — AB (ref 0.61–1.24)
Calcium: 9.1 mg/dL (ref 8.9–10.3)
GFR, EST NON AFRICAN AMERICAN: 53 mL/min — AB (ref >60)
Glucose, Bld: 95 mg/dL (ref 70–99)
POTASSIUM: 3.8 mmol/L (ref 3.5–5.1)
SODIUM: 136 mmol/L (ref 135–145)

## 2018-06-25 NOTE — Patient Instructions (Signed)
Labs today We will only contact you if something comes back abnormal or we need to make some changes. Otherwise no news is good news!  Your physician recommends that you schedule a follow-up appointment in: 4 months  

## 2018-06-26 NOTE — Progress Notes (Signed)
Patient ID: Christopher [name], male   DOB: 08/12/1960, 57 y.o.   MRN: [id number]   PCP: Dr. Hyman Hopes Cardiology: Dr. Shirlee Latch Vascular: Dr Early  57 y.o. with history of CAD s/p CABG, ischemic cardiomyopathy, and CKD presents for followup of CHF and atrial fibrillation.  In 3/17, he came to the hospital with a late presentation inferior MI.  He was noted to have occluded RCA with collaterals and 95% proximal LAD.  He had CABG x 4 + right CEA.  He had a balloon pump with cardiogenic shock.  He additionally had multiple episodes of post-op VT/VF.  Amiodarone was started.  Repeat cath showed slow flow in the SVG-D and SVG-ramus given diffusely diseased and small distal targets.  He developed AKI.  He had prolonged mechanical ventilation and a tracheostomy.  He had RV failure that was treated with Revatio.  He had transient atrial flutter that resolved quickly.  Last echo in 3/17 showed EF 30-35%.  Finally, the tracheostomy was removed.  He went to inpatient rehab for about a week and is now home again.  Echo 6/17 with EF 40-45%.   Admitted 8/21-8/27/18 with acute respiratory failure due to HCAP and HTN emergency. Also had rapid atrial flutter. Rate improved and DCCV was planned for outpatient as he had missed a few doses of Eliquis at home. Discharge weight was 270 pounds. He had DCCV to NSR in 9/18.  He was admitted again in 4/19 after syncopal episode and was found to be in atrial flutter again.  He had a LINQ monitor placed and was cardioverted back to NSR.   Atrial flutter ablation in 5/19.  He is now off Eliquis.   He is in NSR today.  He is overall feeling good.  Coreg was decreased to 3.125 mg bid because of bradycardia.   No exertional dyspnea or chest pain.  He has joined a Paediatric nurse.  He walks up to 5K/day.  He has occasional calf claudication but has been walking through it and says it will stop.   ECG: NSR, left axis deviation, old inferior MI, old anterolateral MI, personally  reviewed  Labs (4/17): K 3.4, creatinine 1.6 Labs (5/17): K 4.4, creatinine 1.65, LFTs normal, TSH mildly elevated, free T3 and free T4 normal, BNP 502, hemoglobin 12.3 Labs (6/17): BNP 737, K 4.4, creatinine 1.72 Labs (7/17): LDL 136, K 4.9, creatinine 1.79 Labs (8/17): BNP 239, K 3.8, creatinine 2.17 Labs (10/17): LDL 74, HDL 34, LFTs normal Labs (7/82): K 5, creatinine 1.67, BNP 774 Labs (6/18): K 3.3, creatinine 1.82 Labs 02/06/2017: K 4.3 Creatinine 1.78.  Labs (9/18): K 4.1, creatinine 1.73 Labs (1/19): LDL 48 Labs (4/19): K 4, creatinine 1.38, hgb 15.3 Labs (9/19): K 4.1, creatinine 1.49, LDL 30, TGs 269  PMH: 1. CAD: Late presentation inferior MI in 3/17.  - LHC showed totally occluded pRCA with L=>R collaterals, 95% pLAD, 70% ramus.   - CABG with LIMA-LAD, SVG-D, SVG-ramus, SVG-RCA.  - Repeat echo in 3/17 after CABG showed patent LIMA and SVG-RCA but SVG-D and SVG- ramus had slow flow with small, diffusely diseased distal targets.  - Echo (6/17) with EF 40-45%, inferior severe hypokinesis, mildly dilated RV with normal systolic function, PASP 30 mmHg.  2. Chronic systolic CHF: Ischemic cardiomyopathy.   - Echo (3/17) with EF 30-35%, septal and inferior AK.   - Echo (7/18): EF 40-45%, moderate FBSH, grade II diastolic dysfunction, RV mildly dilated with normal systolic function.  - Echo (4/19): EF 40-45%, mild  LV dilation 3. RV failure: was on Revatio, now off.  4. CKD: Stage 3.  5. Atrial flutter: Transient, post-op.   - DCCV to NSR in 9/18. - DCCV to NSR in 4/19.   6. VT: Post-op CABG.  7. Carotid stenosis: Right CEA 3/17.  - Carotid dopplers (9/18): 1-39% LICA, s/p R CEA.  - Carotid dopplers (10/19): 1-39% LICA, s/p right CEA 8. PAD: Peripheral artery dopplers (7/17) with > 50% L CIA stenosis, occluded distal external iliac, occluded right SFA, occluded left SFA.  9. Peripheral neuropathy 10. Syncope: 4/19, now has LINQ monitor.   SH: Married, former Engineer, structural now unemployed, no ETOH or smoking.   Family History  Problem Relation Age of Onset  . Heart disease Mother   . Failure to thrive Father   . Congestive Heart Failure Father        Deceased  . Breast cancer Sister        Living   Review of systems complete and found to be negative unless listed in HPI.   Current Outpatient Medications  Medication Sig Dispense Refill  . acetaminophen (TYLENOL) 500 MG tablet Take 1,000 mg by mouth every 6 (six) hours as needed for mild pain or headache.     Marland Kitchen aspirin 81 MG tablet Take 81 mg by mouth daily.    . carvedilol (COREG) 3.125 MG tablet Take 1 tablet (3.125 mg total) by mouth 2 (two) times daily with a meal. 60 tablet 11  . DULoxetine (CYMBALTA) 30 MG capsule Take 30 mg by mouth daily.    Marland Kitchen ezetimibe (ZETIA) 10 MG tablet Take 1 tablet (10 mg total) by mouth daily. 90 tablet 3  . gabapentin (NEURONTIN) 300 MG capsule Take 1 capsule (300 mg total) by mouth 2 (two) times daily. 60 capsule 1  . hydrALAZINE (APRESOLINE) 25 MG tablet TAKE 1 TABLET BY MOUTH EVERY 8 HOURS 90 tablet 3  . isosorbide mononitrate (IMDUR) 30 MG 24 hr tablet TAKE 1 TABLET BY MOUTH ONCE DAILY 30 tablet 5  . potassium chloride SA (K-DUR,KLOR-CON) 20 MEQ tablet Take 1 tablet (20 mEq total) by mouth daily. 90 tablet 3  . QUEtiapine (SEROQUEL) 50 MG tablet Take 25 mg by mouth at bedtime.   11  . ranitidine (ZANTAC) 150 MG tablet Take 150 mg by mouth 2 (two) times daily.    . rosuvastatin (CRESTOR) 40 MG tablet TAKE 1 TABLET BY MOUTH ONCE DAILY 90 tablet 1  . senna-docusate (SENOKOT-S) 8.6-50 MG tablet Take 1 tablet by mouth at bedtime. For constipation 30 tablet 0  . spironolactone (ALDACTONE) 25 MG tablet Take 1 tablet (25 mg total) by mouth daily. 90 tablet 3  . torsemide (DEMADEX) 20 MG tablet TAKE 2 TABLETS BY MOUTH ONCE DAILY 180 tablet 1   No current facility-administered medications for this encounter.    Vitals:   06/25/18 1439  BP: 118/69  Pulse: 69  SpO2:  99%  Weight: 101 kg (222 lb 9.6 oz)    Wt Readings from Last 3 Encounters:  06/25/18 101 kg (222 lb 9.6 oz)  05/24/18 98.9 kg (218 lb)  02/23/18 98.7 kg (217 lb 8 oz)    Physical Exam General: NAD Neck: No JVD, no thyromegaly or thyroid nodule.  Lungs: Clear to auscultation bilaterally with normal respiratory effort. CV: Nondisplaced PMI.  Heart regular S1/S2, no S3/S4, no murmur.  No peripheral edema.  No carotid bruit.  Normal pedal pulses.  Abdomen: Soft, nontender, no hepatosplenomegaly, no distention.  Skin: Intact without lesions or rashes.  Neurologic: Alert and oriented x 3.  Psych: Normal affect. Extremities: No clubbing or cyanosis.  HEENT: Normal.   Assessment/Plan: 1. CAD: s/p CABG. No chest pain.    - Continue Crestor.  - Continue ASA 81 daily.   2. Atrial flutter: Initially occurred post operatively and was transient. Noted to be in Aflutter at office visit in 7/18 and again at hospitalization in 8/18. He had DCCV in 9/18.  He saw Dr. Johney Frame but wanted to hold off on flutter ablation.  However, he had atrial flutter again in 4/19 and was cardioverted.  He is now s/p atrial flutter ablation and is in NSR.  He is now off Eliquis.  3. Chronic systolic CHF: Ischemic cardiomyopathy.  Echo in 3/17 with EF 30-35%, improved to 40-45% on echo in 6/17.  Echo 02/2017 EF 40-45%, echo 4/19 with EF 40-45%.  NYHA II, not volume overloaded on exam.   - Continue Coreg 3.125 mg bid (decreased due to bradycardia).   - Continue Hydralazine 25 mg TID  - Continue Imdur 30 daily.  - Continue torsemide 40 mg daily. BMET today.  - Continue spironolactone 25 mg daily.  - EF above ICD range.  - He had AKI with ARB and with Entresto.   5. VT: No further.  - Off amiodarone.  6. CKD stage III: BMET today.  7. Carotid stenosis: s/p R CEA in 2017, follows with [facility].  8. Hyperlipidemia: Continue Crestor.   9. PAD: Claudication improved with walking program, not markedly symptomatic at this point.   - He follows with [facility].  10. Obesity: Improved with diet/exercise.  11. Syncope: No has LINQ monitor.  No events.    Followup 4 months.    [name], MD  06/26/2018

## 2018-06-29 ENCOUNTER — Ambulatory Visit (INDEPENDENT_AMBULATORY_CARE_PROVIDER_SITE_OTHER): Payer: Medicaid Other

## 2018-06-29 DIAGNOSIS — R55 Syncope and collapse: Secondary | ICD-10-CM | POA: Diagnosis not present

## 2018-07-02 NOTE — Progress Notes (Signed)
Carelink Summary Report / Loop Recorder 

## 2018-08-02 ENCOUNTER — Ambulatory Visit (INDEPENDENT_AMBULATORY_CARE_PROVIDER_SITE_OTHER): Payer: Medicaid Other

## 2018-08-02 DIAGNOSIS — R55 Syncope and collapse: Secondary | ICD-10-CM | POA: Diagnosis not present

## 2018-08-02 LAB — CUP PACEART REMOTE DEVICE CHECK
Implantable Pulse Generator Implant Date: 20190405
MDC IDC SESS DTM: 20191226011005

## 2018-08-02 NOTE — Progress Notes (Signed)
Carelink Summary Report / Loop Recorder 

## 2018-08-19 LAB — CUP PACEART REMOTE DEVICE CHECK
Implantable Pulse Generator Implant Date: 20190405
MDC IDC SESS DTM: 20191122233544

## 2018-09-03 ENCOUNTER — Ambulatory Visit (INDEPENDENT_AMBULATORY_CARE_PROVIDER_SITE_OTHER): Payer: Medicaid Other

## 2018-09-03 DIAGNOSIS — R55 Syncope and collapse: Secondary | ICD-10-CM | POA: Diagnosis not present

## 2018-09-04 LAB — CUP PACEART REMOTE DEVICE CHECK
Date Time Interrogation Session: 20200128010954
Implantable Pulse Generator Implant Date: 20190405

## 2018-09-04 NOTE — Progress Notes (Signed)
Carelink Summary Report / Loop Recorder 

## 2018-09-10 ENCOUNTER — Other Ambulatory Visit (HOSPITAL_COMMUNITY): Payer: Self-pay | Admitting: Cardiology

## 2018-10-07 LAB — CUP PACEART REMOTE DEVICE CHECK
Implantable Pulse Generator Implant Date: 20190405
MDC IDC SESS DTM: 20200301010624

## 2018-10-08 ENCOUNTER — Ambulatory Visit (INDEPENDENT_AMBULATORY_CARE_PROVIDER_SITE_OTHER): Payer: Medicaid Other | Admitting: *Deleted

## 2018-10-08 DIAGNOSIS — R55 Syncope and collapse: Secondary | ICD-10-CM

## 2018-10-08 DIAGNOSIS — I5022 Chronic systolic (congestive) heart failure: Secondary | ICD-10-CM

## 2018-10-15 NOTE — Progress Notes (Signed)
Carelink Summary Report / Loop Recorder 

## 2018-10-16 ENCOUNTER — Other Ambulatory Visit (HOSPITAL_COMMUNITY): Payer: Self-pay | Admitting: Cardiology

## 2018-10-24 ENCOUNTER — Encounter: Payer: Self-pay | Admitting: Cardiology

## 2018-10-25 ENCOUNTER — Encounter (HOSPITAL_COMMUNITY): Payer: Medicaid Other | Admitting: Cardiology

## 2018-10-26 ENCOUNTER — Encounter: Payer: Medicaid Other | Admitting: Cardiology

## 2018-11-08 ENCOUNTER — Other Ambulatory Visit: Payer: Self-pay

## 2018-11-08 ENCOUNTER — Ambulatory Visit (INDEPENDENT_AMBULATORY_CARE_PROVIDER_SITE_OTHER): Payer: Medicaid Other | Admitting: *Deleted

## 2018-11-08 DIAGNOSIS — R55 Syncope and collapse: Secondary | ICD-10-CM

## 2018-11-09 LAB — CUP PACEART REMOTE DEVICE CHECK
Date Time Interrogation Session: 20200403014045
Implantable Pulse Generator Implant Date: 20190405

## 2018-11-12 ENCOUNTER — Other Ambulatory Visit (HOSPITAL_COMMUNITY): Payer: Self-pay | Admitting: Cardiology

## 2018-11-14 ENCOUNTER — Other Ambulatory Visit (HOSPITAL_COMMUNITY): Payer: Self-pay | Admitting: Cardiology

## 2018-11-14 ENCOUNTER — Other Ambulatory Visit (HOSPITAL_COMMUNITY): Payer: Self-pay | Admitting: Internal Medicine

## 2018-11-14 DIAGNOSIS — I509 Heart failure, unspecified: Secondary | ICD-10-CM

## 2018-11-15 NOTE — Progress Notes (Signed)
Carelink Summary Report / Loop Recorder 

## 2018-12-11 ENCOUNTER — Other Ambulatory Visit: Payer: Self-pay

## 2018-12-11 ENCOUNTER — Ambulatory Visit (INDEPENDENT_AMBULATORY_CARE_PROVIDER_SITE_OTHER): Payer: Medicaid Other | Admitting: *Deleted

## 2018-12-11 DIAGNOSIS — R55 Syncope and collapse: Secondary | ICD-10-CM

## 2018-12-12 LAB — CUP PACEART REMOTE DEVICE CHECK
Date Time Interrogation Session: 20200506013802
Implantable Pulse Generator Implant Date: 20190405

## 2018-12-17 ENCOUNTER — Other Ambulatory Visit: Payer: Self-pay | Admitting: Cardiology

## 2018-12-17 DIAGNOSIS — I5022 Chronic systolic (congestive) heart failure: Secondary | ICD-10-CM

## 2018-12-17 MED ORDER — CARVEDILOL 3.125 MG PO TABS: 3.1250 mg | ORAL_TABLET | Freq: Two times a day (BID) | ORAL | 2 refills | Status: DC

## 2018-12-17 NOTE — Addendum Note (Signed)
Addended by: Ernie Hew on: 12/17/2018 04:49 PM   Modules accepted: Orders

## 2018-12-18 NOTE — Progress Notes (Signed)
Carelink Summary Report / Loop Recorder 

## 2019-01-08 ENCOUNTER — Telehealth (HOSPITAL_COMMUNITY): Payer: Self-pay

## 2019-01-08 NOTE — Telephone Encounter (Signed)
Spoke with patient, he asked about upcoming appt and wearing masks. Pt advised he has to wear a mask for in office appt.  Pt reports he will not wear one and he asks if it is necessary since staff is wearing masks. Advised for maximum protection, patient and staff are mandated to wear mask.  Pt asked for visit to be changed to virtual.  Sent to scheduler.

## 2019-01-14 ENCOUNTER — Ambulatory Visit (INDEPENDENT_AMBULATORY_CARE_PROVIDER_SITE_OTHER): Payer: Medicaid Other | Admitting: *Deleted

## 2019-01-14 ENCOUNTER — Other Ambulatory Visit (HOSPITAL_COMMUNITY): Payer: Self-pay | Admitting: Cardiology

## 2019-01-14 DIAGNOSIS — I255 Ischemic cardiomyopathy: Secondary | ICD-10-CM

## 2019-01-14 DIAGNOSIS — R55 Syncope and collapse: Secondary | ICD-10-CM

## 2019-01-14 LAB — CUP PACEART REMOTE DEVICE CHECK
Date Time Interrogation Session: 20200608014215
Implantable Pulse Generator Implant Date: 20190405

## 2019-01-21 NOTE — Progress Notes (Signed)
Carelink Summary Report / Loop Recorder 

## 2019-01-30 ENCOUNTER — Ambulatory Visit (HOSPITAL_COMMUNITY)
Admission: RE | Admit: 2019-01-30 | Discharge: 2019-01-30 | Disposition: A | Discharge status: Home / Self Care | Payer: Medicaid Other | Source: Ambulatory Visit | Attending: Cardiology | Admitting: Cardiology

## 2019-01-30 ENCOUNTER — Other Ambulatory Visit: Payer: Self-pay

## 2019-02-13 ENCOUNTER — Other Ambulatory Visit (HOSPITAL_COMMUNITY): Payer: Self-pay | Admitting: Cardiology

## 2019-02-13 DIAGNOSIS — I509 Heart failure, unspecified: Secondary | ICD-10-CM

## 2019-02-15 ENCOUNTER — Ambulatory Visit (INDEPENDENT_AMBULATORY_CARE_PROVIDER_SITE_OTHER): Payer: Medicaid Other | Admitting: *Deleted

## 2019-02-15 DIAGNOSIS — R55 Syncope and collapse: Secondary | ICD-10-CM

## 2019-02-15 DIAGNOSIS — I442 Atrioventricular block, complete: Secondary | ICD-10-CM

## 2019-02-16 LAB — CUP PACEART REMOTE DEVICE CHECK
Date Time Interrogation Session: 20200711020940
Implantable Pulse Generator Implant Date: 20190405

## 2019-02-19 NOTE — Progress Notes (Signed)
Carelink Summary Report / Loop Recorder 

## 2019-03-18 ENCOUNTER — Other Ambulatory Visit (HOSPITAL_COMMUNITY): Payer: Self-pay | Admitting: Cardiology

## 2019-03-18 ENCOUNTER — Other Ambulatory Visit: Payer: Self-pay | Admitting: Cardiology

## 2019-03-18 DIAGNOSIS — I5022 Chronic systolic (congestive) heart failure: Secondary | ICD-10-CM

## 2019-03-20 ENCOUNTER — Ambulatory Visit (INDEPENDENT_AMBULATORY_CARE_PROVIDER_SITE_OTHER): Payer: Medicaid Other | Admitting: *Deleted

## 2019-03-20 DIAGNOSIS — R55 Syncope and collapse: Secondary | ICD-10-CM | POA: Diagnosis not present

## 2019-03-21 LAB — CUP PACEART REMOTE DEVICE CHECK
Date Time Interrogation Session: 20200813021158
Implantable Pulse Generator Implant Date: 20190405

## 2019-03-28 NOTE — Progress Notes (Signed)
Carelink Summary Report / Loop Recorder 

## 2019-04-12 ENCOUNTER — Telehealth (HOSPITAL_COMMUNITY): Payer: Self-pay

## 2019-04-12 NOTE — Telephone Encounter (Signed)
Faxed FMLA paper work for pt's wife to [employer] at [phone]. fax confirmation received. Original to be scanned into chart.

## 2019-04-15 ENCOUNTER — Other Ambulatory Visit: Payer: Self-pay | Admitting: Cardiology

## 2019-04-15 ENCOUNTER — Other Ambulatory Visit (HOSPITAL_COMMUNITY): Payer: Self-pay | Admitting: Cardiology

## 2019-04-15 DIAGNOSIS — I5022 Chronic systolic (congestive) heart failure: Secondary | ICD-10-CM

## 2019-04-22 ENCOUNTER — Ambulatory Visit (INDEPENDENT_AMBULATORY_CARE_PROVIDER_SITE_OTHER): Payer: Medicare Other | Admitting: *Deleted

## 2019-04-22 DIAGNOSIS — I442 Atrioventricular block, complete: Secondary | ICD-10-CM

## 2019-04-22 DIAGNOSIS — I472 Ventricular tachycardia, unspecified: Secondary | ICD-10-CM

## 2019-04-23 LAB — CUP PACEART REMOTE DEVICE CHECK
Date Time Interrogation Session: 20200915024131
Implantable Pulse Generator Implant Date: 20190405

## 2019-04-24 ENCOUNTER — Other Ambulatory Visit (HOSPITAL_COMMUNITY): Payer: Self-pay | Admitting: *Deleted

## 2019-04-24 DIAGNOSIS — I5022 Chronic systolic (congestive) heart failure: Secondary | ICD-10-CM

## 2019-04-24 MED ORDER — CARVEDILOL 3.125 MG PO TABS: ORAL_TABLET | ORAL | 3 refills | Status: DC

## 2019-05-03 NOTE — Progress Notes (Signed)
Carelink Summary Report / Loop Recorder 

## 2019-05-06 ENCOUNTER — Other Ambulatory Visit: Payer: Self-pay

## 2019-05-06 ENCOUNTER — Ambulatory Visit (HOSPITAL_COMMUNITY)
Admission: RE | Admit: 2019-05-06 | Discharge: 2019-05-06 | Disposition: A | Discharge status: Home / Self Care | Payer: Medicare Other | Source: Ambulatory Visit | Attending: Cardiology | Admitting: Cardiology

## 2019-05-06 ENCOUNTER — Encounter (HOSPITAL_COMMUNITY): Payer: Self-pay | Admitting: Cardiology

## 2019-05-06 VITALS — BP 174/106 | HR 80 | Wt 254.2 lb

## 2019-05-06 DIAGNOSIS — E785 Hyperlipidemia, unspecified: Secondary | ICD-10-CM | POA: Insufficient documentation

## 2019-05-06 DIAGNOSIS — R55 Syncope and collapse: Secondary | ICD-10-CM | POA: Insufficient documentation

## 2019-05-06 DIAGNOSIS — N183 Chronic kidney disease, stage 3 (moderate): Secondary | ICD-10-CM | POA: Diagnosis not present

## 2019-05-06 DIAGNOSIS — I4892 Unspecified atrial flutter: Secondary | ICD-10-CM | POA: Diagnosis not present

## 2019-05-06 DIAGNOSIS — I25810 Atherosclerosis of coronary artery bypass graft(s) without angina pectoris: Secondary | ICD-10-CM | POA: Diagnosis not present

## 2019-05-06 DIAGNOSIS — N179 Acute kidney failure, unspecified: Secondary | ICD-10-CM | POA: Insufficient documentation

## 2019-05-06 DIAGNOSIS — I739 Peripheral vascular disease, unspecified: Secondary | ICD-10-CM | POA: Diagnosis not present

## 2019-05-06 DIAGNOSIS — I255 Ischemic cardiomyopathy: Secondary | ICD-10-CM | POA: Insufficient documentation

## 2019-05-06 DIAGNOSIS — Z79899 Other long term (current) drug therapy: Secondary | ICD-10-CM | POA: Diagnosis not present

## 2019-05-06 DIAGNOSIS — I5022 Chronic systolic (congestive) heart failure: Secondary | ICD-10-CM | POA: Diagnosis present

## 2019-05-06 DIAGNOSIS — Z7982 Long term (current) use of aspirin: Secondary | ICD-10-CM | POA: Insufficient documentation

## 2019-05-06 DIAGNOSIS — I252 Old myocardial infarction: Secondary | ICD-10-CM | POA: Diagnosis not present

## 2019-05-06 DIAGNOSIS — I6529 Occlusion and stenosis of unspecified carotid artery: Secondary | ICD-10-CM | POA: Diagnosis not present

## 2019-05-06 DIAGNOSIS — Z951 Presence of aortocoronary bypass graft: Secondary | ICD-10-CM | POA: Insufficient documentation

## 2019-05-06 DIAGNOSIS — E669 Obesity, unspecified: Secondary | ICD-10-CM | POA: Diagnosis not present

## 2019-05-06 DIAGNOSIS — Z8249 Family history of ischemic heart disease and other diseases of the circulatory system: Secondary | ICD-10-CM | POA: Diagnosis not present

## 2019-05-06 DIAGNOSIS — I2510 Atherosclerotic heart disease of native coronary artery without angina pectoris: Secondary | ICD-10-CM | POA: Diagnosis not present

## 2019-05-06 LAB — CBC
HCT: 46.5 % (ref 39.0–52.0)
Hemoglobin: 15.9 g/dL (ref 13.0–17.0)
MCH: 32.2 pg (ref 26.0–34.0)
MCHC: 34.2 g/dL (ref 30.0–36.0)
MCV: 94.1 fL (ref 80.0–100.0)
Platelets: 221 10*3/uL (ref 150–400)
RBC: 4.94 MIL/uL (ref 4.22–5.81)
RDW: 13.5 % (ref 11.5–15.5)
WBC: 9.4 10*3/uL (ref 4.0–10.5)
nRBC: 0 % (ref 0.0–0.2)

## 2019-05-06 LAB — COMPREHENSIVE METABOLIC PANEL
ALT: 30 U/L (ref 0–44)
AST: 29 U/L (ref 15–41)
Albumin: 4.4 g/dL (ref 3.5–5.0)
Alkaline Phosphatase: 62 U/L (ref 38–126)
Anion gap: 11 (ref 5–15)
BUN: 19 mg/dL (ref 6–20)
CO2: 27 mmol/L (ref 22–32)
Calcium: 8.8 mg/dL — ABNORMAL LOW (ref 8.9–10.3)
Chloride: 100 mmol/L (ref 98–111)
Creatinine, Ser: 1.43 mg/dL — ABNORMAL HIGH (ref 0.61–1.24)
GFR calc Af Amer: >60 mL/min (ref >60)
GFR calc non Af Amer: 54 mL/min — ABNORMAL LOW (ref >60)
Glucose, Bld: 80 mg/dL (ref 70–99)
Potassium: 3.8 mmol/L (ref 3.5–5.1)
Sodium: 138 mmol/L (ref 135–145)
Total Bilirubin: 0.5 mg/dL (ref 0.3–1.2)
Total Protein: 7.5 g/dL (ref 6.5–8.1)

## 2019-05-06 LAB — LIPID PANEL
Cholesterol: 146 mg/dL (ref 0–200)
HDL: 31 mg/dL — ABNORMAL LOW (ref >40)
LDL Cholesterol: 50 mg/dL (ref 0–99)
Total CHOL/HDL Ratio: 4.7 RATIO
Triglycerides: 324 mg/dL — ABNORMAL HIGH (ref <150)
VLDL: 65 mg/dL — ABNORMAL HIGH (ref 0–40)

## 2019-05-06 MED ORDER — HYDRALAZINE HCL 50 MG PO TABS: 50.0000 mg | ORAL_TABLET | Freq: Three times a day (TID) | ORAL | 5 refills | Status: DC

## 2019-05-06 MED ORDER — CARVEDILOL 3.125 MG PO TABS: 6.2500 mg | ORAL_TABLET | Freq: Two times a day (BID) | ORAL | 1 refills | Status: DC

## 2019-05-06 NOTE — Patient Instructions (Addendum)
Labs done today. We will contact you only if your labs are abnormal.  INCREASE Carvedilol to 6.25(2 tabs) by mouth two times daily  INCREASE Hydralazine 50mg (1 tab) by mouth three times daily.  Your physician recommends that you schedule a follow-up appointment in: 2 months with an echo prior to your appointment. Your physician has requested that you have an echocardiogram. Echocardiography is a painless test that uses sound waves to create images of your heart. It provides your doctor with information about the size and shape of your heart and how well your heart's chambers and valves are working. This procedure takes approximately one hour. There are no restrictions for this procedure.   Your physician has requested that you have a carotid duplex. This test is an ultrasound of the carotid arteries in your neck. It looks at blood flow through these arteries that supply the brain with blood. Allow one hour for this exam. There are no restrictions or special instructions.   At the [facility], you and your health needs are our priority. As part of our continuing mission to provide you with exceptional heart care, we have created designated Provider Care Teams. These Care Teams include your primary Cardiologist (physician) and Advanced Practice Providers (APPs- Physician Assistants and Nurse Practitioners) who all work together to provide you with the care you need, when you need it.   You may see any of the following providers on your designated Care Team at your next follow up: Marland Kitchen Dr Glori Bickers . Dr Loralie Champagne . Darrick Grinder, NP   Please be sure to bring in all your medications bottles to every appointment.

## 2019-05-07 NOTE — Progress Notes (Signed)
Patient ID: Christopher [name], male   DOB: 06-27-1961, 58 y.o.   MRN: [id number]   PCP: Dr. Justin Mend Cardiology: Dr. Aundra Dubin Vascular: Dr Early  58 y.o. with history of CAD s/p CABG, ischemic cardiomyopathy, and CKD presents for followup of CHF and atrial fibrillation.  In 3/17, he came to the hospital with a late presentation inferior MI.  He was noted to have occluded RCA with collaterals and 95% proximal LAD.  He had CABG x 4 + right CEA.  He had a balloon pump with cardiogenic shock.  He additionally had multiple episodes of post-op VT/VF.  Amiodarone was started.  Repeat cath showed slow flow in the SVG-D and SVG-ramus given diffusely diseased and small distal targets.  He developed AKI.  He had prolonged mechanical ventilation and a tracheostomy.  He had RV failure that was treated with Revatio.  He had transient atrial flutter that resolved quickly.  Last echo in 3/17 showed EF 30-35%.  Finally, the tracheostomy was removed.  He went to inpatient rehab for about a week and is now home again.  Echo 6/17 with EF 40-45%.   Admitted 8/21-8/27/18 with acute respiratory failure due to HCAP and HTN emergency. Also had rapid atrial flutter. Rate improved and DCCV was planned for outpatient as he had missed a few doses of Eliquis at home. Discharge weight was 270 pounds. He had DCCV to NSR in 9/18.  He was admitted again in 4/19 after syncopal episode and was found to be in atrial flutter again.  He had a LINQ monitor placed and was cardioverted back to NSR.   Atrial flutter ablation in 5/19.  He is now off Eliquis.   He has gained 30 lbs since last appointment.  His gym closed and he has not been exercising since coronavirus pandemic began. He then twisted his knee so laid off walking for exercise for a while.  He has been eating poorly.  He is now back to exercising again and plans to restart the prior diet with which he lost considerable weight. He is now walking up to 20,000 steps/day.  He actually feels  very good despite weight gain.  No exertional dyspnea or chest pain.  BP is high today.  No orthopnea/PND.   ECG: NSR, old anterolateral MI (personally reviewed)  Labs (4/17): K 3.4, creatinine 1.6 Labs (5/17): K 4.4, creatinine 1.65, LFTs normal, TSH mildly elevated, free T3 and free T4 normal, BNP 502, hemoglobin 12.3 Labs (6/17): BNP 737, K 4.4, creatinine 1.72 Labs (7/17): LDL 136, K 4.9, creatinine 1.79 Labs (8/17): BNP 239, K 3.8, creatinine 2.17 Labs (10/17): LDL 74, HDL 34, LFTs normal Labs (1/18): K 5, creatinine 1.67, BNP 774 Labs (6/18): K 3.3, creatinine 1.82 Labs 02/06/2017: K 4.3 Creatinine 1.78.  Labs (9/18): K 4.1, creatinine 1.73 Labs (1/19): LDL 48 Labs (4/19): K 4, creatinine 1.38, hgb 15.3 Labs (9/19): K 4.1, creatinine 1.49, LDL 30, TGs 269 Labs (11/19): K 3.8, creatinine 1.43  PMH: 1. CAD: Late presentation inferior MI in 3/17.  - LHC showed totally occluded pRCA with L=>R collaterals, 95% pLAD, 70% ramus.   - CABG with LIMA-LAD, SVG-D, SVG-ramus, SVG-RCA.  - Repeat echo in 3/17 after CABG showed patent LIMA and SVG-RCA but SVG-D and SVG- ramus had slow flow with small, diffusely diseased distal targets.  - Echo (6/17) with EF 40-45%, inferior severe hypokinesis, mildly dilated RV with normal systolic function, PASP 30 mmHg.  2. Chronic systolic CHF: Ischemic cardiomyopathy.   - Echo (  3/17) with EF 30-35%, septal and inferior AK.   - Echo (7/18): EF 40-45%, moderate FBSH, grade II diastolic dysfunction, RV mildly dilated with normal systolic function.  - Echo (4/19): EF 40-45%, mild LV dilation 3. RV failure: was on Revatio, now off.  4. CKD: Stage 3.  5. Atrial flutter: Transient, post-op.   - DCCV to NSR in 9/18. - DCCV to NSR in 4/19.   6. VT: Post-op CABG.  7. Carotid stenosis: Right CEA 3/17.  - Carotid dopplers (9/18): 1-39% LICA, s/p R CEA.  - Carotid dopplers (10/19): 1-39% LICA, s/p right CEA 8. PAD: Peripheral artery dopplers (7/17) with > 50% L CIA  stenosis, occluded distal external iliac, occluded right SFA, occluded left SFA.  9. Peripheral neuropathy 10. Syncope: 4/19, now has LINQ monitor.   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   Family History  Problem Relation Age of Onset  . Heart disease Mother   . Failure to thrive Father   . Congestive Heart Failure Father        Deceased  . Breast cancer Sister        Living   Review of systems complete and found to be negative unless listed in HPI.   Current Outpatient Medications  Medication Sig Dispense Refill  . acetaminophen (TYLENOL) 500 MG tablet Take 1,000 mg by mouth every 6 (six) hours as needed for mild pain or headache.     Marland Kitchen aspirin 81 MG tablet Take 81 mg by mouth daily.    . carvedilol (COREG) 3.125 MG tablet Take 2 tablets (6.25 mg total) by mouth 2 (two) times daily with a meal. 360 tablet 1  . DULoxetine (CYMBALTA) 30 MG capsule Take 30 mg by mouth daily.    Marland Kitchen ezetimibe (ZETIA) 10 MG tablet Take 1 tablet by mouth once daily 90 tablet 0  . gabapentin (NEURONTIN) 300 MG capsule Take 1 capsule (300 mg total) by mouth 2 (two) times daily. 60 capsule 1  . hydrALAZINE (APRESOLINE) 50 MG tablet Take 1 tablet (50 mg total) by mouth 3 (three) times daily. 90 tablet 5  . isosorbide mononitrate (IMDUR) 30 MG 24 hr tablet Take 1 tablet by mouth once daily 30 tablet 0  . potassium chloride SA (K-DUR) 20 MEQ tablet Take 1 tablet by mouth once daily 90 tablet 0  . QUEtiapine (SEROQUEL) 50 MG tablet Take 25 mg by mouth at bedtime.   11  . ranitidine (ZANTAC) 150 MG tablet Take 150 mg by mouth 2 (two) times daily.    . rosuvastatin (CRESTOR) 40 MG tablet Take 1 tablet by mouth once daily 90 tablet 0  . senna-docusate (SENOKOT-S) 8.6-50 MG tablet Take 1 tablet by mouth at bedtime. For constipation 30 tablet 0  . spironolactone (ALDACTONE) 25 MG tablet Take 1 tablet by mouth once daily 90 tablet 0  . torsemide (DEMADEX) 20 MG tablet Take 2  tablets by mouth once daily 180 tablet 0   No current facility-administered medications for this encounter.    Vitals:   05/06/19 1444  BP: (!) 174/106  Pulse: 80  SpO2: 98%  Weight: 115.3 kg (254 lb 3.2 oz)    Wt Readings from Last 3 Encounters:  05/06/19 115.3 kg (254 lb 3.2 oz)  06/25/18 101 kg (222 lb 9.6 oz)  05/24/18 98.9 kg (218 lb)    Physical Exam General: NAD, obese.  Neck: No JVD, no thyromegaly or thyroid nodule.  Lungs: Clear to auscultation bilaterally  with normal respiratory effort. CV: Nondisplaced PMI.  Heart regular S1/S2, no S3/S4, no murmur.  No peripheral edema.  No carotid bruit.  Normal pedal pulses.  Abdomen: Soft, nontender, no hepatosplenomegaly, no distention.  Skin: Intact without lesions or rashes.  Neurologic: Alert and oriented x 3.  Psych: Normal affect. Extremities: No clubbing or cyanosis.  HEENT: Normal.   Assessment/Plan: 1. CAD: s/p CABG. No chest pain.    - Continue Crestor. Check lipids today.  - Continue ASA 81 daily.   2. Atrial flutter: Initially occurred post operatively and was transient. Noted to be in Aflutter at office visit in 7/18 and again at hospitalization in 8/18. He had DCCV in 9/18.  He saw Dr. Johney Frame but wanted to hold off on flutter ablation.  However, he had atrial flutter again in 4/19 and was cardioverted.  He is now s/p atrial flutter ablation and is in NSR.  He is now off Eliquis.   3. Chronic systolic CHF: Ischemic cardiomyopathy.  Echo in 3/17 with EF 30-35%, improved to 40-45% on echo in 6/17.  Echo 02/2017 EF 40-45%, echo 4/19 with EF 40-45%.  NYHA II, not volume overloaded on exam.  I think that his weight gain was caloric (poor diet, minimal exercise).    - Increase Coreg to 6.25 mg bid.   - Increase hydralazine to 50 mg TID  - Continue Imdur 30 daily.  - Continue torsemide 40 mg daily. BMET today.  - Continue spironolactone 25 mg daily.  - EF above ICD range.  - He had AKI with ARB and with Entresto.   5. VT:  No further.  - Off amiodarone.  6. CKD stage III: BMET today.  7. Carotid stenosis: s/p R CEA in 2017, follows with [facility].  - Due for carotid dopplers.  8. Hyperlipidemia: Continue Crestor.   - Check lipids today.  9. PAD: Claudication improved with walking program, not markedly symptomatic at this point.  - He follows with [facility].  10. Obesity: Weight back up.  Long discussion about diet/exercise for weight loss.  11. Syncope: No has LINQ monitor.  No events.    Followup 2 months with echo.   [name], MD  05/07/2019

## 2019-05-08 ENCOUNTER — Other Ambulatory Visit (HOSPITAL_COMMUNITY): Payer: Self-pay | Admitting: Internal Medicine

## 2019-05-08 ENCOUNTER — Telehealth (HOSPITAL_COMMUNITY): Payer: Self-pay

## 2019-05-08 ENCOUNTER — Other Ambulatory Visit (HOSPITAL_COMMUNITY): Payer: Self-pay | Admitting: Cardiology

## 2019-05-08 DIAGNOSIS — I509 Heart failure, unspecified: Secondary | ICD-10-CM

## 2019-05-08 MED ORDER — ICOSAPENT ETHYL 1 G PO CAPS: 2.0000 g | ORAL_CAPSULE | Freq: Two times a day (BID) | ORAL | 6 refills | Status: DC

## 2019-05-08 NOTE — Telephone Encounter (Signed)
Pt aware of lab results and medication recommendations.  medicatioin sent to pharmacy. Verbalized understanding

## 2019-05-08 NOTE — Telephone Encounter (Signed)
-----   Message from Larey Dresser, MD sent at 05/07/2019  4:11 PM EDT ----- High triglycerides, start Vascepa 2 g bid.

## 2019-05-09 ENCOUNTER — Telehealth (HOSPITAL_COMMUNITY): Payer: Self-pay | Admitting: Pharmacist

## 2019-05-09 NOTE — Telephone Encounter (Signed)
Patient Advocate Encounter   Received notification from Medicaid that prior authorization for Christopher [name] is required.   PA submitted on [employer] ID: [id number] O  [id number] Status is pending   Will continue to follow.  Audry Riles, PharmD, BCPS, CPP Heart Failure Clinic Pharmacist [phone]

## 2019-05-10 ENCOUNTER — Telehealth (HOSPITAL_COMMUNITY): Payer: Self-pay | Admitting: Pharmacist

## 2019-05-10 NOTE — Telephone Encounter (Signed)
Advanced Heart Failure Patient Advocate Encounter  Prior Authorization for Dalene Seltzer has been approved.    PA# [id number] Effective dates: 05/09/2019 through 05/03/2020  Patients co-pay is $3.00. Left voicemail for patient.   Audry Riles, PharmD, BCPS, CPP Heart Failure Clinic Pharmacist [phone]

## 2019-05-21 ENCOUNTER — Telehealth (HOSPITAL_COMMUNITY): Payer: Self-pay | Admitting: *Deleted

## 2019-05-21 NOTE — Telephone Encounter (Signed)
Pt left vm requesting a call back about appointment scheduled for tomorrow. I do not see that patient has an appt tomorrow there is a cancelled carotid appt and it says cancelled by patient. I called patient back to get more information. No answer/left vm.

## 2019-05-22 ENCOUNTER — Ambulatory Visit (HOSPITAL_COMMUNITY)
Admission: RE | Admit: 2019-05-22 | Payer: Medicare HMO | Source: Ambulatory Visit | Attending: Cardiology | Admitting: Cardiology

## 2019-05-27 ENCOUNTER — Ambulatory Visit (INDEPENDENT_AMBULATORY_CARE_PROVIDER_SITE_OTHER): Payer: Medicare HMO | Admitting: *Deleted

## 2019-05-27 DIAGNOSIS — I5022 Chronic systolic (congestive) heart failure: Secondary | ICD-10-CM

## 2019-05-27 DIAGNOSIS — R55 Syncope and collapse: Secondary | ICD-10-CM

## 2019-05-27 LAB — CUP PACEART REMOTE DEVICE CHECK
Date Time Interrogation Session: 20201018024543
Implantable Pulse Generator Implant Date: 20190405

## 2019-06-09 ENCOUNTER — Other Ambulatory Visit (HOSPITAL_COMMUNITY): Payer: Self-pay | Admitting: Cardiology

## 2019-06-17 ENCOUNTER — Other Ambulatory Visit: Payer: Self-pay

## 2019-06-17 ENCOUNTER — Ambulatory Visit (HOSPITAL_COMMUNITY)
Admission: RE | Admit: 2019-06-17 | Discharge: 2019-06-17 | Disposition: A | Discharge status: Home / Self Care | Payer: Medicare HMO | Source: Ambulatory Visit | Attending: Cardiology | Admitting: Cardiology

## 2019-06-17 DIAGNOSIS — I6529 Occlusion and stenosis of unspecified carotid artery: Secondary | ICD-10-CM

## 2019-06-17 NOTE — Progress Notes (Signed)
Carelink Summary Report / Loop Recorder 

## 2019-06-27 ENCOUNTER — Ambulatory Visit (INDEPENDENT_AMBULATORY_CARE_PROVIDER_SITE_OTHER): Payer: Medicare HMO | Admitting: *Deleted

## 2019-06-27 DIAGNOSIS — I442 Atrioventricular block, complete: Secondary | ICD-10-CM

## 2019-06-27 DIAGNOSIS — I472 Ventricular tachycardia, unspecified: Secondary | ICD-10-CM

## 2019-06-28 LAB — CUP PACEART REMOTE DEVICE CHECK
Date Time Interrogation Session: 20201119214453
Implantable Pulse Generator Implant Date: 20190405

## 2019-07-09 ENCOUNTER — Ambulatory Visit (HOSPITAL_COMMUNITY): Payer: Medicare HMO

## 2019-07-09 ENCOUNTER — Other Ambulatory Visit: Payer: Self-pay

## 2019-07-09 ENCOUNTER — Ambulatory Visit (HOSPITAL_COMMUNITY)
Admission: RE | Admit: 2019-07-09 | Discharge: 2019-07-09 | Disposition: A | Discharge status: Home / Self Care | Payer: Medicare HMO | Source: Ambulatory Visit | Attending: Cardiology | Admitting: Cardiology

## 2019-07-09 DIAGNOSIS — E785 Hyperlipidemia, unspecified: Secondary | ICD-10-CM | POA: Diagnosis not present

## 2019-07-09 DIAGNOSIS — I5022 Chronic systolic (congestive) heart failure: Secondary | ICD-10-CM

## 2019-07-10 MED ORDER — CARVEDILOL 3.125 MG PO TABS: 9.3750 mg | ORAL_TABLET | Freq: Two times a day (BID) | ORAL | 1 refills | Status: DC

## 2019-07-10 NOTE — Progress Notes (Signed)
LATE ENTRY FROM 07/09/2019 AT 5:15 PM:    Orders per Dr Aundra Dubin: 1. Lipid, bmet soon 2. Increase Carvedilol to 9.375 mg Twice daily 3. F/u w/echo in 3 months 4. Give pt letter stating that he gets claustrophobic with mask causing tachycardia, hypertension and anxiety with panic attacks.  Cannot wear mask for extended period.   Called and spoke w/pt, he is aware, agreeable and verbalizes understanding.  Lab sch for 12/8 at 9 am, he states he can wear his mask long enough to come in for quick lab draw.  RX for carvedilol sent int, f/u appt and echo scheduled, letter composed.  Pt will p/u letter, AVS and appt card when he comes for labs 12/8.

## 2019-07-10 NOTE — Progress Notes (Signed)
Heart Failure TeleHealth Note  Due to national recommendations of social distancing due to COVID 19, Audio/video telehealth visit is felt to be most appropriate for this patient at this time.  See MyChart message from today for patient consent regarding telehealth for [facility].  Date:  07/10/2019   ID:  Christopher [name], DOB 1960/08/15, MRN [id number]  Location: Home  Provider location: [place] [facility] Type of Visit: Established patient   PCP:  Shirlean Mylar, MD  Cardiologist:  [name], MD  Chief Complaint: Shortness of breath.   History of Present Illness: Christopher [name] is a 58 y.o. male who presents via audio/video conferencing for a telehealth visit today.     he denies symptoms worrisome for COVID 19.   Patient has a history of CAD s/p CABG, ischemic cardiomyopathy, atrial fibrillation, and CKD.  In 3/17, he came to the hospital with a late presentation inferior MI.  He was noted to have occluded RCA with collaterals and 95% proximal LAD.  He had CABG x 4 + right CEA.  He had a balloon pump with cardiogenic shock.  He additionally had multiple episodes of post-op VT/VF.  Amiodarone was started.  Repeat cath showed slow flow in the SVG-D and SVG-ramus given diffusely diseased and small distal targets.  He developed AKI.  He had prolonged mechanical ventilation and a tracheostomy.  He had RV failure that was treated with Revatio.  He had transient atrial flutter that resolved quickly.  Last echo in 3/17 showed EF 30-35%.  Finally, the tracheostomy was removed.  He went to inpatient rehab for about a week and is now home again.  Echo 6/17 with EF 40-45%.   Admitted 8/21-8/27/18 with acute respiratory failure due to HCAP and HTN emergency. Also had rapid atrial flutter. Rate improved and DCCV was planned for outpatient as he had missed a few doses of Eliquis at home. Discharge weight was 270 pounds. He had DCCV to NSR in 9/18.  He was admitted again in 4/19  after syncopal episode and was found to be in atrial flutter again.  He had a LINQ monitor placed and was cardioverted back to NSR.   Atrial flutter ablation in 5/19.  He is now off Eliquis.   He has been walking more and is losing weight.  No significant exertional dyspnea. He is able to run for short distances chasing his grandson.  No chest pain.  No orthopnea/PND.  Has trouble with masks => claustrophobia and panic attacks.   Labs (4/17): K 3.4, creatinine 1.6 Labs (5/17): K 4.4, creatinine 1.65, LFTs normal, TSH mildly elevated, free T3 and free T4 normal, BNP 502, hemoglobin 12.3 Labs (6/17): BNP 737, K 4.4, creatinine 1.72 Labs (7/17): LDL 136, K 4.9, creatinine 1.79 Labs (8/17): BNP 239, K 3.8, creatinine 2.17 Labs (10/17): LDL 74, HDL 34, LFTs normal Labs (4/94): K 5, creatinine 1.67, BNP 774 Labs (6/18): K 3.3, creatinine 1.82 Labs 02/06/2017: K 4.3 Creatinine 1.78.  Labs (9/18): K 4.1, creatinine 1.73 Labs (1/19): LDL 48 Labs (4/19): K 4, creatinine 1.38, hgb 15.3 Labs (9/19): K 4.1, creatinine 1.49, LDL 30, TGs 269 Labs (11/19): K 3.8, creatinine 1.43 Labs (9/20): K 3.8, creatinine 1.43, LDL 50, HDL 31, TGs 324  PMH: 1. CAD: Late presentation inferior MI in 3/17.  - LHC showed totally occluded pRCA with L=>R collaterals, 95% pLAD, 70% ramus.   - CABG with LIMA-LAD, SVG-D, SVG-ramus, SVG-RCA.  - Repeat echo in 3/17 after CABG showed  patent LIMA and SVG-RCA but SVG-D and SVG- ramus had slow flow with small, diffusely diseased distal targets.  - Echo (6/17) with EF 40-45%, inferior severe hypokinesis, mildly dilated RV with normal systolic function, PASP 30 mmHg.  2. Chronic systolic CHF: Ischemic cardiomyopathy.   - Echo (3/17) with EF 30-35%, septal and inferior AK.   - Echo (7/18): EF 40-45%, moderate FBSH, grade II diastolic dysfunction, RV mildly dilated with normal systolic function.  - Echo (4/19): EF 40-45%, mild LV dilation 3. RV failure: was on Revatio, now off.  4.  CKD: Stage 3.  5. Atrial flutter: Transient, post-op.   - DCCV to NSR in 9/18. - DCCV to NSR in 4/19.   6. VT: Post-op CABG.  7. Carotid stenosis: Right CEA 3/17.  - Carotid dopplers (9/02): 4-09% LICA, s/p R CEA.  - Carotid dopplers (73/53): 2-99% LICA, s/p right CEA 8. PAD: Peripheral artery dopplers (7/17) with > 50% L CIA stenosis, occluded distal external iliac, occluded right SFA, occluded left SFA.  9. Peripheral neuropathy 10. Syncope: 4/19, now has LINQ monitor.   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   Family History  Problem Relation Age of Onset  . Heart disease Mother   . Failure to thrive Father   . Congestive Heart Failure Father        Deceased  . Breast cancer Sister        Living   Review of systems complete and found to be negative unless listed in HPI.   Current Outpatient Medications  Medication Sig Dispense Refill  . acetaminophen (TYLENOL) 500 MG tablet Take 1,000 mg by mouth every 6 (six) hours as needed for mild pain or headache.     Marland Kitchen aspirin 81 MG tablet Take 81 mg by mouth daily.    . carvedilol (COREG) 3.125 MG tablet Take 3 tablets (9.375 mg total) by mouth 2 (two) times daily with a meal. 360 tablet 1  . DULoxetine (CYMBALTA) 30 MG capsule Take 30 mg by mouth daily.    Marland Kitchen ezetimibe (ZETIA) 10 MG tablet Take 1 tablet by mouth once daily 90 tablet 0  . gabapentin (NEURONTIN) 300 MG capsule Take 1 capsule (300 mg total) by mouth 2 (two) times daily. 60 capsule 1  . hydrALAZINE (APRESOLINE) 50 MG tablet Take 1 tablet (50 mg total) by mouth 3 (three) times daily. 90 tablet 5  . Icosapent Ethyl 1 g CAPS Take 2 capsules (2 g total) by mouth 2 (two) times daily. 120 capsule 6  . isosorbide mononitrate (IMDUR) 30 MG 24 hr tablet Take 1 tablet by mouth once daily 30 tablet 0  . potassium chloride SA (K-DUR) 20 MEQ tablet Take 1 tablet by mouth once daily 90 tablet 0  . QUEtiapine (SEROQUEL) 50 MG tablet Take 25 mg by  mouth at bedtime.   11  . ranitidine (ZANTAC) 150 MG tablet Take 150 mg by mouth 2 (two) times daily.    . rosuvastatin (CRESTOR) 40 MG tablet Take 1 tablet by mouth once daily 90 tablet 0  . senna-docusate (SENOKOT-S) 8.6-50 MG tablet Take 1 tablet by mouth at bedtime. For constipation 30 tablet 0  . spironolactone (ALDACTONE) 25 MG tablet Take 1 tablet by mouth once daily 90 tablet 0  . torsemide (DEMADEX) 20 MG tablet Take 2 tablets by mouth once daily 180 tablet 0   No current facility-administered medications for this encounter.    There were no vitals filed for  this visit.  Wt Readings from Last 3 Encounters:  05/06/19 115.3 kg (254 lb 3.2 oz)  06/25/18 101 kg (222 lb 9.6 oz)  05/24/18 98.9 kg (218 lb)    Exam:  (Video/Tele Health Call; Exam is subjective and or/visual.) General:  Speaks in full sentences. No resp difficulty. Lungs: Normal respiratory effort with conversation.  Abdomen: Non-distended per patient report Extremities: Pt denies edema. Neuro: Alert & oriented x 3.   Assessment/Plan: 1. CAD: s/p CABG. No chest pain.    - Continue Crestor and Vascepa. Check lipids today.  - Continue ASA 81 daily.   2. Atrial flutter: Initially occurred post operatively and was transient. Noted to be in Aflutter at office visit in 7/18 and again at hospitalization in 8/18. He had DCCV in 9/18.  He saw Dr. Johney Frame but wanted to hold off on flutter ablation.  However, he had atrial flutter again in 4/19 and was cardioverted.  He is now s/p atrial flutter ablation and is in NSR.  He is now off Eliquis.   3. Chronic systolic CHF: Ischemic cardiomyopathy.  Echo in 3/17 with EF 30-35%, improved to 40-45% on echo in 6/17.  Echo 02/2017 EF 40-45%, echo 4/19 with EF 40-45%.  NYHA II, weight now coming down.  - Increase Coreg to 9.375 mg bid.   - Continue hydralazine 50 mg TID  - Continue Imdur 30 daily.  - Continue torsemide 40 mg daily. I will arrange for BMET.   - Continue spironolactone 25 mg  daily.  - EF above ICD range.  - He had AKI with ARB and with Entresto.   - I will arrange for repeat echo to reassess LV function.  5. VT: No further.  - Off amiodarone.  6. CKD stage III: BMET today.  7. Carotid stenosis: s/p R CEA in 2017, follows with [facility].  8. Hyperlipidemia: Continue Crestor and Vascepa.   - Check lipids. 9. PAD: Claudication improved with walking program, not markedly symptomatic at this point.  - He follows with [facility].  10. Obesity: Weight now coming down.  11. Syncope: Now has LINQ monitor.  No events.    COVID screen The patient does not have any symptoms that suggest any further testing/ screening at this time.  Social distancing reinforced today.  Patient Risk: After full review of this patients clinical status, I feel that they are at moderate risk for cardiac decompensation at this time.  Relevant cardiac medications were reviewed at length with the patient today. The patient does not have concerns regarding their medications at this time.   Recommended follow-up:  2 months.   Today, I have spent 17 minutes with the patient with telehealth technology discussing the above issues .    Signed, [name], MD  07/10/2019 11:54 PM  [facility] [address] [facility] and Vascular [place] [address] [phone] (office) [phone] (fax)

## 2019-07-10 NOTE — Patient Instructions (Addendum)
Increase Carvedilol to 9.375 mg (3 tabs) Twice daily   Your physician recommends that you return for a FASTING lipid profile: 07/16/2019 at 9 am  Your physician recommends that you schedule a follow-up appointment in 3 months with echocardiogram:   Tue March 9th at 1:15 PM for echo        2:20 PM with Dr Aundra Dubin  Parking code [phone]  If you have any questions or concerns before your next appointment please send [place] a message through [place] or call our office at [phone].  At the [facility], you and your health needs are our priority. As part of our continuing mission to provide you with exceptional heart care, we have created designated Provider Care Teams. These Care Teams include your primary Cardiologist (physician) and Advanced Practice Providers (APPs- Physician Assistants and Nurse Practitioners) who all work together to provide you with the care you need, when you need it.   You may see any of the following providers on your designated Care Team at your next follow up: Marland Kitchen Dr Glori Bickers . Dr Loralie Champagne . Darrick Grinder, NP . Lyda Jester, PA . Audry Riles, PharmD   Please be sure to bring in all your medications bottles to every appointment.

## 2019-07-15 ENCOUNTER — Other Ambulatory Visit (HOSPITAL_COMMUNITY): Payer: Self-pay | Admitting: Cardiology

## 2019-07-16 ENCOUNTER — Other Ambulatory Visit (HOSPITAL_COMMUNITY): Payer: Self-pay | Admitting: Cardiology

## 2019-07-16 ENCOUNTER — Ambulatory Visit (HOSPITAL_COMMUNITY)
Admission: RE | Admit: 2019-07-16 | Discharge: 2019-07-16 | Disposition: A | Discharge status: Home / Self Care | Payer: Medicare HMO | Source: Ambulatory Visit | Attending: Internal Medicine | Admitting: Internal Medicine

## 2019-07-16 ENCOUNTER — Other Ambulatory Visit: Payer: Self-pay

## 2019-07-16 DIAGNOSIS — I5022 Chronic systolic (congestive) heart failure: Secondary | ICD-10-CM | POA: Diagnosis not present

## 2019-07-16 DIAGNOSIS — E785 Hyperlipidemia, unspecified: Secondary | ICD-10-CM | POA: Insufficient documentation

## 2019-07-16 LAB — BASIC METABOLIC PANEL
Anion gap: 10 (ref 5–15)
BUN: 16 mg/dL (ref 6–20)
CO2: 30 mmol/L (ref 22–32)
Calcium: 9.4 mg/dL (ref 8.9–10.3)
Chloride: 101 mmol/L (ref 98–111)
Creatinine, Ser: 1.4 mg/dL — ABNORMAL HIGH (ref 0.61–1.24)
GFR calc Af Amer: >60 mL/min (ref >60)
GFR calc non Af Amer: 55 mL/min — ABNORMAL LOW (ref >60)
Glucose, Bld: 120 mg/dL — ABNORMAL HIGH (ref 70–99)
Potassium: 4.1 mmol/L (ref 3.5–5.1)
Sodium: 141 mmol/L (ref 135–145)

## 2019-07-16 LAB — LIPID PANEL
Cholesterol: 119 mg/dL (ref 0–200)
HDL: 34 mg/dL — ABNORMAL LOW (ref >40)
LDL Cholesterol: 36 mg/dL (ref 0–99)
Total CHOL/HDL Ratio: 3.5 RATIO
Triglycerides: 245 mg/dL — ABNORMAL HIGH (ref <150)
VLDL: 49 mg/dL — ABNORMAL HIGH (ref 0–40)

## 2019-07-17 ENCOUNTER — Telehealth (HOSPITAL_COMMUNITY): Payer: Self-pay

## 2019-07-17 ENCOUNTER — Telehealth (HOSPITAL_COMMUNITY): Payer: Self-pay | Admitting: Pharmacist

## 2019-07-17 NOTE — Telephone Encounter (Signed)
Pt called and left message that he received a letter from [facility] stating that they will no longer cover vascepa.  Sent to pharmacy team to assist.

## 2019-07-17 NOTE — Telephone Encounter (Signed)
Patient stated he got a letter from [facility] stating they will not cover Vascepa starting August 09, 2019. I called [employer] to try and start a non-formulary request. Representative informed me that they will cover Vascepa and that neither a PA or a non-formulary request are needed. She could not tell me why he received the letter. I will have him mail me the letter he received to verify. Vascepa should continue to be available to him in 2021.

## 2019-07-26 NOTE — Progress Notes (Signed)
Carelink Summary Report / Loop Recorder 

## 2019-07-29 ENCOUNTER — Other Ambulatory Visit (HOSPITAL_COMMUNITY): Payer: Self-pay | Admitting: Cardiology

## 2019-07-30 ENCOUNTER — Ambulatory Visit (INDEPENDENT_AMBULATORY_CARE_PROVIDER_SITE_OTHER): Payer: Medicare HMO | Admitting: *Deleted

## 2019-07-30 DIAGNOSIS — I472 Ventricular tachycardia, unspecified: Secondary | ICD-10-CM

## 2019-07-31 LAB — CUP PACEART REMOTE DEVICE CHECK
Date Time Interrogation Session: 20201222214352
Implantable Pulse Generator Implant Date: 20190405

## 2019-08-05 ENCOUNTER — Other Ambulatory Visit (HOSPITAL_COMMUNITY): Payer: Self-pay | Admitting: Cardiology

## 2019-08-13 ENCOUNTER — Other Ambulatory Visit (HOSPITAL_COMMUNITY): Payer: Self-pay | Admitting: Cardiology

## 2019-08-19 ENCOUNTER — Other Ambulatory Visit (HOSPITAL_COMMUNITY): Payer: Self-pay | Admitting: Cardiology

## 2019-08-19 DIAGNOSIS — I509 Heart failure, unspecified: Secondary | ICD-10-CM

## 2019-09-02 ENCOUNTER — Ambulatory Visit (INDEPENDENT_AMBULATORY_CARE_PROVIDER_SITE_OTHER): Payer: Medicare HMO | Admitting: *Deleted

## 2019-09-02 ENCOUNTER — Inpatient Hospital Stay (HOSPITAL_COMMUNITY): Admission: RE | Admit: 2019-09-02 | Payer: Medicare HMO | Source: Ambulatory Visit

## 2019-09-02 DIAGNOSIS — I472 Ventricular tachycardia, unspecified: Secondary | ICD-10-CM

## 2019-09-02 LAB — CUP PACEART REMOTE DEVICE CHECK
Date Time Interrogation Session: 20210124233856
Implantable Pulse Generator Implant Date: 20190405

## 2019-09-16 ENCOUNTER — Other Ambulatory Visit (HOSPITAL_COMMUNITY): Payer: Self-pay | Admitting: Cardiology

## 2019-10-07 ENCOUNTER — Ambulatory Visit (INDEPENDENT_AMBULATORY_CARE_PROVIDER_SITE_OTHER): Payer: Medicare HMO | Admitting: *Deleted

## 2019-10-07 DIAGNOSIS — I472 Ventricular tachycardia, unspecified: Secondary | ICD-10-CM

## 2019-10-07 LAB — CUP PACEART REMOTE DEVICE CHECK
Date Time Interrogation Session: 20210228231354
Implantable Pulse Generator Implant Date: 20190405

## 2019-10-07 NOTE — Progress Notes (Signed)
ILR Remote 

## 2019-10-15 ENCOUNTER — Other Ambulatory Visit (HOSPITAL_COMMUNITY): Payer: Self-pay | Admitting: Cardiology

## 2019-10-15 ENCOUNTER — Encounter (HOSPITAL_COMMUNITY): Payer: Medicare HMO | Admitting: Cardiology

## 2019-10-15 ENCOUNTER — Ambulatory Visit (HOSPITAL_COMMUNITY): Payer: Medicare HMO

## 2019-10-28 ENCOUNTER — Other Ambulatory Visit (HOSPITAL_COMMUNITY): Payer: Self-pay | Admitting: Cardiology

## 2019-11-04 ENCOUNTER — Other Ambulatory Visit (HOSPITAL_COMMUNITY): Payer: Self-pay | Admitting: Cardiology

## 2019-11-11 ENCOUNTER — Other Ambulatory Visit (HOSPITAL_COMMUNITY): Payer: Self-pay | Admitting: Cardiology

## 2019-11-11 ENCOUNTER — Ambulatory Visit (INDEPENDENT_AMBULATORY_CARE_PROVIDER_SITE_OTHER): Payer: Medicare HMO | Admitting: *Deleted

## 2019-11-11 DIAGNOSIS — I472 Ventricular tachycardia, unspecified: Secondary | ICD-10-CM

## 2019-11-12 LAB — CUP PACEART REMOTE DEVICE CHECK
Date Time Interrogation Session: 20210401001846
Implantable Pulse Generator Implant Date: 20190405

## 2019-11-18 ENCOUNTER — Other Ambulatory Visit (HOSPITAL_COMMUNITY): Payer: Self-pay | Admitting: Cardiology

## 2019-11-18 DIAGNOSIS — I5022 Chronic systolic (congestive) heart failure: Secondary | ICD-10-CM

## 2019-11-18 DIAGNOSIS — I509 Heart failure, unspecified: Secondary | ICD-10-CM

## 2019-11-20 ENCOUNTER — Ambulatory Visit (HOSPITAL_COMMUNITY): Payer: Medicare HMO

## 2019-11-25 ENCOUNTER — Other Ambulatory Visit (HOSPITAL_COMMUNITY): Payer: Self-pay | Admitting: Cardiology

## 2019-11-27 DIAGNOSIS — G6289 Other specified polyneuropathies: Secondary | ICD-10-CM | POA: Diagnosis not present

## 2019-12-03 ENCOUNTER — Telehealth: Payer: Self-pay | Admitting: Emergency Medicine

## 2019-12-03 NOTE — Telephone Encounter (Signed)
Patient had alert for pause on 12/01/19 at 0643. Hx of nocturnal pauses. Confirmed that patient was sleeping and unaware of event.

## 2019-12-06 ENCOUNTER — Encounter (HOSPITAL_COMMUNITY): Payer: Medicare HMO | Admitting: Cardiology

## 2019-12-06 ENCOUNTER — Other Ambulatory Visit (HOSPITAL_COMMUNITY): Payer: Medicare HMO

## 2019-12-12 LAB — CUP PACEART REMOTE DEVICE CHECK
Date Time Interrogation Session: 20210502002806
Implantable Pulse Generator Implant Date: 20190405

## 2019-12-16 ENCOUNTER — Ambulatory Visit (INDEPENDENT_AMBULATORY_CARE_PROVIDER_SITE_OTHER): Payer: Medicare HMO | Admitting: *Deleted

## 2019-12-16 DIAGNOSIS — R55 Syncope and collapse: Secondary | ICD-10-CM | POA: Diagnosis not present

## 2019-12-16 NOTE — Progress Notes (Signed)
Carelink Summary Report / Loop Recorder 

## 2019-12-26 ENCOUNTER — Telehealth: Payer: Self-pay

## 2019-12-26 NOTE — Telephone Encounter (Signed)
Carelink alert report received. Battery status OK. Normal device function. No new symptom episodes, tachy episodes, or pause episodes. No new AF episodes.  There was one bradycardia arrhythmia detected that was at 0652 and was at a rate less than 30 bpm and appears to be heart block.  This lasted 12 seconds.    There do appear to be missing episodes, will need manual transmission for full details.  Loop implant due to syncope, pt with history of nocturnal brady episodes.  Current medications include Carvedilol 9.375 mg BID.   Spoke with pt, he confirms that he was sleeping at time of transmission.  He reports that he felt good yesterday and in fact started exercising completing more than 10,000 steps.  Attmepted to assist pt with sending manual transmission, home monitor device needed technical support/ charging, will watch for manual transmissions and add data when received.

## 2019-12-26 NOTE — Telephone Encounter (Signed)
Continue to monitor for daytime pauses

## 2019-12-27 ENCOUNTER — Other Ambulatory Visit (HOSPITAL_COMMUNITY): Payer: Self-pay | Admitting: Cardiology

## 2019-12-27 DIAGNOSIS — I255 Ischemic cardiomyopathy: Secondary | ICD-10-CM | POA: Diagnosis not present

## 2019-12-27 DIAGNOSIS — I739 Peripheral vascular disease, unspecified: Secondary | ICD-10-CM | POA: Diagnosis not present

## 2019-12-27 DIAGNOSIS — Z6841 Body Mass Index (BMI) 40.0 and over, adult: Secondary | ICD-10-CM | POA: Diagnosis not present

## 2019-12-27 DIAGNOSIS — T887XXA Unspecified adverse effect of drug or medicament, initial encounter: Secondary | ICD-10-CM | POA: Diagnosis not present

## 2019-12-27 DIAGNOSIS — I2510 Atherosclerotic heart disease of native coronary artery without angina pectoris: Secondary | ICD-10-CM | POA: Diagnosis not present

## 2020-01-02 ENCOUNTER — Telehealth: Payer: Self-pay

## 2020-01-02 NOTE — Telephone Encounter (Signed)
Received Carelink alert 01/02/20 for 3 second pause at 06:29 am. Called patient to assess, patient reports of was asleep during this time. Patient denies any complaints. Will continue to follow as requested by Dr. Elberta Fortis.

## 2020-01-09 ENCOUNTER — Encounter (HOSPITAL_COMMUNITY): Payer: Medicare HMO

## 2020-01-20 ENCOUNTER — Ambulatory Visit (INDEPENDENT_AMBULATORY_CARE_PROVIDER_SITE_OTHER): Payer: Medicare HMO | Admitting: *Deleted

## 2020-01-20 DIAGNOSIS — R55 Syncope and collapse: Secondary | ICD-10-CM

## 2020-01-20 LAB — CUP PACEART REMOTE DEVICE CHECK
Date Time Interrogation Session: 20210613235232
Implantable Pulse Generator Implant Date: 20190405

## 2020-01-21 NOTE — Progress Notes (Signed)
Carelink Summary Report / Loop Recorder 

## 2020-01-22 ENCOUNTER — Telehealth: Payer: Self-pay | Admitting: *Deleted

## 2020-01-22 NOTE — Telephone Encounter (Signed)
Spoke with patient regarding reprogramming recommendations received from Dr. Elberta Fortis >4.5 sec and brady detection to <30bpm for >=12 beats. Pt agreeable to a DC appointment on 02/06/20 at 8:30am. Pt aware of office location and denies questions at this time.

## 2020-01-22 NOTE — Telephone Encounter (Signed)
-----   Message from Will Jorja Loa, MD sent at 01/21/2020 11:52 AM EDT ----- Normal LINQ reviewed. No arrhythmias seen, normal histograms. Reprogram as below.

## 2020-01-29 ENCOUNTER — Other Ambulatory Visit (HOSPITAL_COMMUNITY): Payer: Self-pay | Admitting: Cardiology

## 2020-01-31 ENCOUNTER — Other Ambulatory Visit (HOSPITAL_COMMUNITY): Payer: Self-pay | Admitting: Cardiology

## 2020-02-06 ENCOUNTER — Telehealth: Payer: Self-pay

## 2020-02-06 ENCOUNTER — Ambulatory Visit (INDEPENDENT_AMBULATORY_CARE_PROVIDER_SITE_OTHER): Payer: Medicare HMO | Admitting: Emergency Medicine

## 2020-02-06 ENCOUNTER — Other Ambulatory Visit: Payer: Self-pay

## 2020-02-06 DIAGNOSIS — R55 Syncope and collapse: Secondary | ICD-10-CM

## 2020-02-06 LAB — CUP PACEART INCLINIC DEVICE CHECK
Date Time Interrogation Session: 20210701084308
Date Time Interrogation Session: 20210701083500
Implantable Pulse Generator Implant Date: 20190405
Implantable Pulse Generator Implant Date: 20190405

## 2020-02-06 NOTE — Telephone Encounter (Signed)
I spoke with [employer] and they are sending him another handheld. It should get to him in 5 days.

## 2020-02-06 NOTE — Progress Notes (Signed)
Loop check in clinic. Battery status: OK. R-waves 0.22 mV. 0 symptom episodes,0 tachy episodes, 3 pause episodes, asyptomatic and nocturnal, 21 brady episodes that were asymptomatic and < 4 seconds. 0  AF episodes (0 % burden). Monthly summary reports. Brady detection programmed to 12 beats and pase detection to > 4.5 seconds per Dr Elberta Fortis. Patient will send manual transmission as soon as he gets new monitor.

## 2020-02-06 NOTE — Patient Instructions (Signed)
Send remote transmission as soon as you get new transmitter.

## 2020-02-12 ENCOUNTER — Telehealth: Payer: Self-pay

## 2020-02-12 NOTE — Telephone Encounter (Signed)
Patient called in today to let us know that his new home monitor came in and he has sent in a transmission as confirmation and I instructed the patient to keep that monitor plugged in all the time. Patient verbalized understanding

## 2020-02-14 ENCOUNTER — Other Ambulatory Visit (HOSPITAL_COMMUNITY): Payer: Self-pay | Admitting: Cardiology

## 2020-02-19 ENCOUNTER — Ambulatory Visit (HOSPITAL_COMMUNITY): Payer: Medicare HMO

## 2020-02-24 ENCOUNTER — Ambulatory Visit (INDEPENDENT_AMBULATORY_CARE_PROVIDER_SITE_OTHER): Payer: Medicare HMO | Admitting: *Deleted

## 2020-02-24 DIAGNOSIS — R55 Syncope and collapse: Secondary | ICD-10-CM | POA: Diagnosis not present

## 2020-02-24 LAB — CUP PACEART REMOTE DEVICE CHECK
Date Time Interrogation Session: 20210718232316
Implantable Pulse Generator Implant Date: 20190405

## 2020-02-26 NOTE — Progress Notes (Signed)
Carelink Summary Report / Loop Recorder 

## 2020-02-28 ENCOUNTER — Other Ambulatory Visit: Payer: Self-pay

## 2020-02-28 ENCOUNTER — Encounter (HOSPITAL_COMMUNITY): Payer: Self-pay | Admitting: Cardiology

## 2020-02-28 ENCOUNTER — Ambulatory Visit (HOSPITAL_COMMUNITY)
Admission: RE | Admit: 2020-02-28 | Discharge: 2020-02-28 | Disposition: A | Discharge status: Home / Self Care | Payer: Medicare HMO | Source: Ambulatory Visit | Attending: Cardiology | Admitting: Cardiology

## 2020-02-28 ENCOUNTER — Telehealth: Payer: Self-pay | Admitting: Cardiology

## 2020-02-28 ENCOUNTER — Ambulatory Visit (HOSPITAL_BASED_OUTPATIENT_CLINIC_OR_DEPARTMENT_OTHER)
Admission: RE | Admit: 2020-02-28 | Discharge: 2020-02-28 | Disposition: A | Discharge status: Home / Self Care | Payer: Medicare HMO | Source: Ambulatory Visit | Attending: Cardiology | Admitting: Cardiology

## 2020-02-28 VITALS — BP 152/82 | HR 60 | Wt 257.0 lb

## 2020-02-28 DIAGNOSIS — I4891 Unspecified atrial fibrillation: Secondary | ICD-10-CM | POA: Diagnosis not present

## 2020-02-28 DIAGNOSIS — E669 Obesity, unspecified: Secondary | ICD-10-CM | POA: Insufficient documentation

## 2020-02-28 DIAGNOSIS — N183 Chronic kidney disease, stage 3 unspecified: Secondary | ICD-10-CM | POA: Diagnosis not present

## 2020-02-28 DIAGNOSIS — I2510 Atherosclerotic heart disease of native coronary artery without angina pectoris: Secondary | ICD-10-CM | POA: Insufficient documentation

## 2020-02-28 DIAGNOSIS — I4892 Unspecified atrial flutter: Secondary | ICD-10-CM | POA: Insufficient documentation

## 2020-02-28 DIAGNOSIS — Z7984 Long term (current) use of oral hypoglycemic drugs: Secondary | ICD-10-CM | POA: Insufficient documentation

## 2020-02-28 DIAGNOSIS — Z7982 Long term (current) use of aspirin: Secondary | ICD-10-CM | POA: Diagnosis not present

## 2020-02-28 DIAGNOSIS — Z6834 Body mass index (BMI) 34.0-34.9, adult: Secondary | ICD-10-CM | POA: Insufficient documentation

## 2020-02-28 DIAGNOSIS — I739 Peripheral vascular disease, unspecified: Secondary | ICD-10-CM | POA: Insufficient documentation

## 2020-02-28 DIAGNOSIS — R55 Syncope and collapse: Secondary | ICD-10-CM | POA: Diagnosis not present

## 2020-02-28 DIAGNOSIS — E785 Hyperlipidemia, unspecified: Secondary | ICD-10-CM | POA: Diagnosis not present

## 2020-02-28 DIAGNOSIS — Z8249 Family history of ischemic heart disease and other diseases of the circulatory system: Secondary | ICD-10-CM | POA: Diagnosis not present

## 2020-02-28 DIAGNOSIS — N179 Acute kidney failure, unspecified: Secondary | ICD-10-CM | POA: Insufficient documentation

## 2020-02-28 DIAGNOSIS — I5022 Chronic systolic (congestive) heart failure: Secondary | ICD-10-CM | POA: Insufficient documentation

## 2020-02-28 DIAGNOSIS — I255 Ischemic cardiomyopathy: Secondary | ICD-10-CM | POA: Diagnosis not present

## 2020-02-28 DIAGNOSIS — I130 Hypertensive heart and chronic kidney disease with heart failure and stage 1 through stage 4 chronic kidney disease, or unspecified chronic kidney disease: Secondary | ICD-10-CM | POA: Insufficient documentation

## 2020-02-28 DIAGNOSIS — Z951 Presence of aortocoronary bypass graft: Secondary | ICD-10-CM | POA: Diagnosis not present

## 2020-02-28 DIAGNOSIS — I252 Old myocardial infarction: Secondary | ICD-10-CM | POA: Diagnosis not present

## 2020-02-28 DIAGNOSIS — Z79899 Other long term (current) drug therapy: Secondary | ICD-10-CM | POA: Diagnosis not present

## 2020-02-28 LAB — BASIC METABOLIC PANEL
Anion gap: 11 (ref 5–15)
BUN: 18 mg/dL (ref 6–20)
CO2: 28 mmol/L (ref 22–32)
Calcium: 9.1 mg/dL (ref 8.9–10.3)
Chloride: 100 mmol/L (ref 98–111)
Creatinine, Ser: 1.32 mg/dL — ABNORMAL HIGH (ref 0.61–1.24)
GFR calc Af Amer: >60 mL/min (ref >60)
GFR calc non Af Amer: 59 mL/min — ABNORMAL LOW (ref >60)
Glucose, Bld: 105 mg/dL — ABNORMAL HIGH (ref 70–99)
Potassium: 3.6 mmol/L (ref 3.5–5.1)
Sodium: 139 mmol/L (ref 135–145)

## 2020-02-28 LAB — LIPID PANEL
Cholesterol: 101 mg/dL (ref 0–200)
HDL: 25 mg/dL — ABNORMAL LOW (ref >40)
LDL Cholesterol: 31 mg/dL (ref 0–99)
Total CHOL/HDL Ratio: 4 RATIO
Triglycerides: 225 mg/dL — ABNORMAL HIGH (ref <150)
VLDL: 45 mg/dL — ABNORMAL HIGH (ref 0–40)

## 2020-02-28 LAB — ECHOCARDIOGRAM COMPLETE
Area-P 1/2: 4.6 cm2
Calc EF: 37.1 %
S' Lateral: 4.2 cm
Single Plane A2C EF: 36.1 %
Single Plane A4C EF: 40.8 %

## 2020-02-28 MED ORDER — CARVEDILOL 12.5 MG PO TABS: 12.5000 mg | ORAL_TABLET | Freq: Two times a day (BID) | ORAL | 6 refills | Status: DC

## 2020-02-28 MED ORDER — DAPAGLIFLOZIN PROPANEDIOL 10 MG PO TABS
10.0000 mg | ORAL_TABLET | Freq: Every day | ORAL | 6 refills | Status: DC
Start: 2020-02-28 — End: 2020-09-24

## 2020-02-28 NOTE — Progress Notes (Signed)
  Echocardiogram 2D Echocardiogram has been performed.  Christopher [name] 02/28/2020, 2:08 PM

## 2020-02-28 NOTE — Patient Instructions (Signed)
Increase Carvedilol to 12.5 mg Twice daily   Start Farxiga 10 mg daily  Labs done today, your results will be available in MyChart, we will contact you for abnormal readings.  Your physician has requested that you have a lower or upper extremity arterial duplex. This test is an ultrasound of the arteries in the legs or arms. It looks at arterial blood flow in the legs and arms. Allow one hour for Lower and Upper Arterial scans. There are no restrictions or special instructions  Please call our office in November to schedule your follow up appointment  If you have any questions or concerns before your next appointment please send [place] a message through [place] or call our office at [phone].    TO LEAVE A MESSAGE FOR THE NURSE SELECT OPTION 2, PLEASE LEAVE A MESSAGE INCLUDING: . YOUR NAME . DATE OF BIRTH . CALL BACK NUMBER . REASON FOR CALL**this is important as we prioritize the call backs  YOU WILL RECEIVE A CALL BACK THE SAME DAY AS LONG AS YOU CALL BEFORE 4:00 PM  At the [facility], you and your health needs are our priority. As part of our continuing mission to provide you with exceptional heart care, we have created designated Provider Care Teams. These Care Teams include your primary Cardiologist (physician) and Advanced Practice Providers (APPs- Physician Assistants and Nurse Practitioners) who all work together to provide you with the care you need, when you need it.   You may see any of the following providers on your designated Care Team at your next follow up: Marland Kitchen Dr Arvilla Meres . Dr [name] . Tonye Becket, NP . Robbie Lis, PA . Karle Plumber, PharmD   Please be sure to bring in all your medications bottles to every appointment.

## 2020-02-28 NOTE — Telephone Encounter (Signed)
Patient returned call for lab results. I informed him Dr. Alford Highland office had called him and transferred him the their office.

## 2020-03-01 NOTE — Progress Notes (Signed)
ID:  Christopher [name], DOB 1961/03/04, MRN [id number]   Provider location: [facility] Type of Visit: Established patient   PCP:  Shirlean Mylar, MD  Cardiologist:  [name], MD   History of Present Illness:  Patient has a history of CAD s/p CABG, ischemic cardiomyopathy, atrial fibrillation, and CKD.  In 3/17, he came to the hospital with a late presentation inferior MI.  He was noted to have occluded RCA with collaterals and 95% proximal LAD.  He had CABG x 4 + right CEA.  He had a balloon pump with cardiogenic shock.  He additionally had multiple episodes of post-op VT/VF.  Amiodarone was started.  Repeat cath showed slow flow in the SVG-D and SVG-ramus given diffusely diseased and small distal targets.  He developed AKI.  He had prolonged mechanical ventilation and a tracheostomy.  He had RV failure that was treated with Revatio.  He had transient atrial flutter that resolved quickly.  Last echo in 3/17 showed EF 30-35%.  Finally, the tracheostomy was removed.  He went to inpatient rehab for about a week and is now home again. Echo 6/17 with EF 40-45%.   Admitted 8/21-8/27/18 with acute respiratory failure due to HCAP and HTN emergency. Also had rapid atrial flutter. Rate improved and DCCV was planned for outpatient as he had missed a few doses of Eliquis at home. Discharge weight was 270 pounds. He had DCCV to NSR in 9/18.  He was admitted again in 4/19 after syncopal episode and was found to be in atrial flutter again.  He had a LINQ monitor placed and was cardioverted back to NSR.   Atrial flutter ablation in 5/19.  He is now off Eliquis.   Echo was done today and reviewed, EF 40-455, inferior/inferoseptal hypokinesis, mildly decreased RV systolic function.   He returns today for followup of CHF and CAD.   He has been walking more and watching his diet, has lost 20 lbs in 3 months.  No chest pain.  No significant exertional dyspnea.  No claudication though he does  have neuropathic-type pain in his feet.  BP is elevated today.   ECG (personally reviewed): NSR, LVH with repolarization abnormality, old anterolateral and inferior MI  Labs (4/17): K 3.4, creatinine 1.6 Labs (5/17): K 4.4, creatinine 1.65, LFTs normal, TSH mildly elevated, free T3 and free T4 normal, BNP 502, hemoglobin 12.3 Labs (6/17): BNP 737, K 4.4, creatinine 1.72 Labs (7/17): LDL 136, K 4.9, creatinine 1.79 Labs (8/17): BNP 239, K 3.8, creatinine 2.17 Labs (10/17): LDL 74, HDL 34, LFTs normal Labs (5/40): K 5, creatinine 1.67, BNP 774 Labs (6/18): K 3.3, creatinine 1.82 Labs 02/06/2017: K 4.3 Creatinine 1.78.  Labs (9/18): K 4.1, creatinine 1.73 Labs (1/19): LDL 48 Labs (4/19): K 4, creatinine 1.38, hgb 15.3 Labs (9/19): K 4.1, creatinine 1.49, LDL 30, TGs 269 Labs (11/19): K 3.8, creatinine 1.43 Labs (9/20): K 3.8, creatinine 1.43, LDL 50, HDL 31, TGs 324 Labs (12/20): K 4.1, creatinine 1.4, LDL 36, TGs 245  PMH: 1. CAD: Late presentation inferior MI in 3/17.  - LHC showed totally occluded pRCA with L=>R collaterals, 95% pLAD, 70% ramus.   - CABG with LIMA-LAD, SVG-D, SVG-ramus, SVG-RCA.  - Repeat echo in 3/17 after CABG showed patent LIMA and SVG-RCA but SVG-D and SVG- ramus had slow flow with small, diffusely diseased distal targets.  - Echo (6/17) with EF 40-45%, inferior severe hypokinesis, mildly dilated RV with normal systolic function, PASP 30 mmHg.  2. Chronic systolic CHF: Ischemic cardiomyopathy.   - Echo (3/17) with EF 30-35%, septal and inferior AK.   - Echo (7/18): EF 40-45%, moderate FBSH, grade II diastolic dysfunction, RV mildly dilated with normal systolic function.  - Echo (4/19): EF 40-45%, mild LV dilation - Echo (7/21): EF 40-45%, inferior/inferoseptal hypokinesis, grade II diastolic dysfunction, mildly decreased RV systolic function, PASP 42 mmHg.  3. RV failure: was on Revatio, now off.  4. CKD: Stage 3.  5. Atrial flutter: Transient, post-op.   - DCCV  to NSR in 9/18. - DCCV to NSR in 4/19.   6. VT: Post-op CABG.  7. Carotid stenosis: Right CEA 3/17.  - Carotid dopplers (9/18): 1-39% LICA, s/p R CEA.  - Carotid dopplers (10/19): 1-39% LICA, s/p right CEA 8. PAD: Peripheral artery dopplers (7/17) with > 50% L CIA stenosis, occluded distal external iliac, occluded right SFA, occluded left SFA.  9. Peripheral neuropathy 10. Syncope: 4/19, now has LINQ monitor.   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   Family History  Problem Relation Age of Onset  . Heart disease Mother   . Failure to thrive Father   . Congestive Heart Failure Father        Deceased  . Breast cancer Sister        Living   Review of systems complete and found to be negative unless listed in HPI.   Current Outpatient Medications  Medication Sig Dispense Refill  . acetaminophen (TYLENOL) 500 MG tablet Take 1,000 mg by mouth every 6 (six) hours as needed for mild pain or headache.     Marland Kitchen aspirin 81 MG tablet Take 81 mg by mouth daily.    . carvedilol (COREG) 12.5 MG tablet Take 1 tablet (12.5 mg total) by mouth 2 (two) times daily with a meal. 60 tablet 6  . DULoxetine (CYMBALTA) 30 MG capsule Take 30 mg by mouth daily.    Marland Kitchen ezetimibe (ZETIA) 10 MG tablet Take 1 tablet by mouth once daily 90 tablet 0  . hydrALAZINE (APRESOLINE) 50 MG tablet Take 1 tablet (50 mg total) by mouth 3 (three) times daily. 270 tablet 3  . isosorbide mononitrate (IMDUR) 30 MG 24 hr tablet Take 1 tablet by mouth once daily 30 tablet 0  . potassium chloride SA (KLOR-CON) 20 MEQ tablet Take 1 tablet by mouth once daily 90 tablet 0  . QUEtiapine (SEROQUEL) 50 MG tablet Take 25 mg by mouth at bedtime.   11  . ranitidine (ZANTAC) 150 MG tablet Take 150 mg by mouth 2 (two) times daily.    . rosuvastatin (CRESTOR) 40 MG tablet Take 1 tablet by mouth once daily 90 tablet 3  . senna-docusate (SENOKOT-S) 8.6-50 MG tablet Take 1 tablet by mouth at bedtime. For  constipation 30 tablet 0  . spironolactone (ALDACTONE) 25 MG tablet Take 1 tablet by mouth once daily 90 tablet 0  . torsemide (DEMADEX) 20 MG tablet Take 2 tablets by mouth once daily 180 tablet 0  . VASCEPA 1 g capsule Take 2 capsules (2 g total) by mouth 2 (two) times daily. 360 capsule 3  . dapagliflozin propanediol (FARXIGA) 10 MG TABS tablet Take 1 tablet (10 mg total) by mouth daily before breakfast. 30 tablet 6   No current facility-administered medications for this encounter.   Vitals:   02/28/20 1406  BP: (!) 152/82  Pulse: 60  SpO2: 98%  Weight: (!) 116.6 kg (257 lb)    Wt  Readings from Last 3 Encounters:  02/28/20 (!) 116.6 kg (257 lb)  05/06/19 115.3 kg (254 lb 3.2 oz)  06/25/18 101 kg (222 lb 9.6 oz)    Exam:   BP (!) 152/82   Pulse 60   Wt (!) 116.6 kg (257 lb)   SpO2 98%   BMI 34.86 kg/m  General: NAD Neck: No JVD, no thyromegaly or thyroid nodule.  Lungs: Clear to auscultation bilaterally with normal respiratory effort. CV: Nondisplaced PMI.  Heart regular S1/S2, no S3/S4, no murmur.  No peripheral edema.  No carotid bruit.  Difficult to palpate pedal pulses.  Abdomen: Soft, nontender, no hepatosplenomegaly, no distention.  Skin: Intact without lesions or rashes.  Neurologic: Alert and oriented x 3.  Psych: Normal affect. Extremities: No clubbing or cyanosis.  HEENT: Normal.   Assessment/Plan: 1. CAD: s/p CABG. No chest pain.    - Continue Crestor and Vascepa. Check lipids today.  - Continue ASA 81 daily.   2. Atrial flutter: Initially occurred post operatively and was transient. Noted to be in Aflutter at office visit in 7/18 and again at hospitalization in 8/18. He had DCCV in 9/18.  He saw Dr. Johney Frame but wanted to hold off on flutter ablation.  However, he had atrial flutter again in 4/19 and was cardioverted.  He is now s/p atrial flutter ablation and is in NSR.  He is now off Eliquis.   3. Chronic systolic CHF: Ischemic cardiomyopathy.  Echo in 3/17  with EF 30-35%, improved to 40-45% on echo in 6/17.  Echo 02/2017 EF 40-45%, echo 4/19 with EF 40-45%.  Echo today showed stable EF 40-45%.   NYHA II, weight now coming down.  He is not volume overloaded on exam.  - Increase Coreg to 12.5 mg bid.   - Continue hydralazine 50 mg TID  - Continue Imdur 30 daily.  - Continue torsemide 40 mg daily. BMET today.   - Continue spironolactone 25 mg daily.  - EF above ICD range.  - He had AKI with ARB and with Entresto.   - Given CHF and CKD, I will have him start dapagliflozin 10 mg daily with BMET 10 days.   5. VT: No further.  - Off amiodarone.  6. CKD stage III: BMET today.  7. Carotid stenosis: s/p R CEA in 2017, follows with [facility].  8. Hyperlipidemia: Continue Crestor and Vascepa.   - Check lipids. 9. PAD: Claudication improved with walking program, not markedly symptomatic at this point.  - Due for peripheral arterial dopplers, will arrange.  10. Obesity: Weight now coming down.  11. Syncope: Now has LINQ monitor.  No events.    Followup in 4 months.    Signed, [name], MD  03/01/2020   [facility]  [address] [facility] [place] [address] [phone] (office) [phone] (fax)

## 2020-03-04 ENCOUNTER — Telehealth (HOSPITAL_COMMUNITY): Payer: Self-pay

## 2020-03-04 MED ORDER — FENOFIBRATE 54 MG PO TABS: 54.0000 mg | ORAL_TABLET | Freq: Every day | ORAL | 3 refills | Status: DC

## 2020-03-04 NOTE — Telephone Encounter (Signed)
New Rx ordered and sent into patients pharmacy.

## 2020-03-04 NOTE — Telephone Encounter (Signed)
-----   Message from Laurey Morale, MD sent at 02/28/2020  4:07 PM EDT ----- TGs still high, add fenofibrate 45 mg daily. Lipids 2 months.

## 2020-03-04 NOTE — Telephone Encounter (Signed)
Patient advised and verbalized understanding.lab appt scheduled,lab order entered. No 45mg  dosage do you want 48 or 54 tricor?

## 2020-03-04 NOTE — Telephone Encounter (Signed)
Fenofibrate 54 mg

## 2020-03-04 NOTE — Addendum Note (Signed)
Addended by: Rob Bunting R on: 03/04/2020 05:47 PM   Modules accepted: Orders

## 2020-03-06 ENCOUNTER — Other Ambulatory Visit (HOSPITAL_COMMUNITY): Payer: Self-pay | Admitting: Cardiology

## 2020-03-23 ENCOUNTER — Encounter (HOSPITAL_COMMUNITY): Payer: Medicare HMO

## 2020-03-26 ENCOUNTER — Other Ambulatory Visit (HOSPITAL_COMMUNITY): Payer: Self-pay | Admitting: Cardiology

## 2020-03-26 DIAGNOSIS — I739 Peripheral vascular disease, unspecified: Secondary | ICD-10-CM

## 2020-03-29 LAB — CUP PACEART REMOTE DEVICE CHECK
Date Time Interrogation Session: 20210819001426
Implantable Pulse Generator Implant Date: 20190405

## 2020-03-30 ENCOUNTER — Ambulatory Visit (HOSPITAL_COMMUNITY)
Admission: RE | Admit: 2020-03-30 | Payer: Medicare HMO | Source: Ambulatory Visit | Attending: Cardiology | Admitting: Cardiology

## 2020-03-30 ENCOUNTER — Ambulatory Visit (INDEPENDENT_AMBULATORY_CARE_PROVIDER_SITE_OTHER): Payer: Medicare HMO | Admitting: *Deleted

## 2020-03-30 ENCOUNTER — Encounter (HOSPITAL_COMMUNITY): Payer: Self-pay

## 2020-03-30 DIAGNOSIS — R55 Syncope and collapse: Secondary | ICD-10-CM | POA: Diagnosis not present

## 2020-03-31 ENCOUNTER — Encounter (HOSPITAL_COMMUNITY): Payer: Self-pay

## 2020-04-02 NOTE — Progress Notes (Signed)
Carelink Summary Report / Loop Recorder 

## 2020-04-16 ENCOUNTER — Encounter (HOSPITAL_COMMUNITY): Payer: Self-pay | Admitting: *Deleted

## 2020-04-16 NOTE — Progress Notes (Signed)
Pt's wife's FMLA forms for intermittent leave completed, signed, and faxed to [facility] at [phone]. Left VM for her this was done and also sent message through MyChart

## 2020-04-23 ENCOUNTER — Other Ambulatory Visit (HOSPITAL_COMMUNITY): Payer: Self-pay | Admitting: Cardiology

## 2020-04-24 ENCOUNTER — Other Ambulatory Visit (HOSPITAL_COMMUNITY): Payer: Self-pay | Admitting: *Deleted

## 2020-05-04 ENCOUNTER — Ambulatory Visit (INDEPENDENT_AMBULATORY_CARE_PROVIDER_SITE_OTHER): Payer: Medicare HMO | Admitting: Emergency Medicine

## 2020-05-04 DIAGNOSIS — R55 Syncope and collapse: Secondary | ICD-10-CM | POA: Diagnosis not present

## 2020-05-04 LAB — CUP PACEART REMOTE DEVICE CHECK
Date Time Interrogation Session: 20210919002952
Implantable Pulse Generator Implant Date: 20190405

## 2020-05-06 ENCOUNTER — Ambulatory Visit (HOSPITAL_COMMUNITY)
Admission: RE | Admit: 2020-05-06 | Discharge: 2020-05-06 | Disposition: A | Discharge status: Home / Self Care | Payer: Medicare HMO | Source: Ambulatory Visit | Attending: Internal Medicine | Admitting: Internal Medicine

## 2020-05-06 ENCOUNTER — Other Ambulatory Visit: Payer: Self-pay

## 2020-05-06 DIAGNOSIS — E785 Hyperlipidemia, unspecified: Secondary | ICD-10-CM

## 2020-05-06 LAB — LIPID PANEL
Cholesterol: 127 mg/dL (ref 0–200)
HDL: 33 mg/dL — ABNORMAL LOW (ref >40)
LDL Cholesterol: 49 mg/dL (ref 0–99)
Total CHOL/HDL Ratio: 3.8 RATIO
Triglycerides: 225 mg/dL — ABNORMAL HIGH (ref <150)
VLDL: 45 mg/dL — ABNORMAL HIGH (ref 0–40)

## 2020-05-06 NOTE — Progress Notes (Signed)
Carelink Summary Report / Loop Recorder 

## 2020-05-27 ENCOUNTER — Other Ambulatory Visit (HOSPITAL_COMMUNITY): Payer: Self-pay | Admitting: Cardiology

## 2020-05-27 ENCOUNTER — Ambulatory Visit (INDEPENDENT_AMBULATORY_CARE_PROVIDER_SITE_OTHER): Payer: Medicare HMO

## 2020-05-27 DIAGNOSIS — R55 Syncope and collapse: Secondary | ICD-10-CM | POA: Diagnosis not present

## 2020-05-27 LAB — CUP PACEART REMOTE DEVICE CHECK
Date Time Interrogation Session: 20211020021317
Implantable Pulse Generator Implant Date: 20190405

## 2020-06-02 NOTE — Progress Notes (Signed)
Carelink Summary Report / Loop Recorder 

## 2020-06-27 LAB — CUP PACEART REMOTE DEVICE CHECK
Date Time Interrogation Session: 20211120013209
Implantable Pulse Generator Implant Date: 20190405

## 2020-06-29 ENCOUNTER — Ambulatory Visit (INDEPENDENT_AMBULATORY_CARE_PROVIDER_SITE_OTHER): Payer: Medicare HMO

## 2020-06-29 DIAGNOSIS — R55 Syncope and collapse: Secondary | ICD-10-CM | POA: Diagnosis not present

## 2020-06-30 NOTE — Progress Notes (Signed)
Carelink Summary Report / Loop Recorder 

## 2020-07-17 ENCOUNTER — Other Ambulatory Visit (HOSPITAL_COMMUNITY): Payer: Self-pay | Admitting: Cardiology

## 2020-07-24 ENCOUNTER — Encounter (HOSPITAL_COMMUNITY): Payer: Medicare HMO | Admitting: Cardiology

## 2020-07-24 ENCOUNTER — Other Ambulatory Visit (HOSPITAL_COMMUNITY): Payer: Self-pay | Admitting: Cardiology

## 2020-07-28 LAB — CUP PACEART REMOTE DEVICE CHECK
Date Time Interrogation Session: 20211221013257
Implantable Pulse Generator Implant Date: 20190405

## 2020-08-03 ENCOUNTER — Ambulatory Visit (INDEPENDENT_AMBULATORY_CARE_PROVIDER_SITE_OTHER): Payer: Medicare HMO

## 2020-08-03 DIAGNOSIS — R55 Syncope and collapse: Secondary | ICD-10-CM

## 2020-08-17 NOTE — Progress Notes (Signed)
Carelink Summary Report / Loop Recorder 

## 2020-09-07 ENCOUNTER — Ambulatory Visit (INDEPENDENT_AMBULATORY_CARE_PROVIDER_SITE_OTHER): Payer: Medicare HMO

## 2020-09-07 DIAGNOSIS — R55 Syncope and collapse: Secondary | ICD-10-CM | POA: Diagnosis not present

## 2020-09-07 LAB — CUP PACEART REMOTE DEVICE CHECK
Date Time Interrogation Session: 20220129231122
Implantable Pulse Generator Implant Date: 20190405

## 2020-09-10 ENCOUNTER — Other Ambulatory Visit (HOSPITAL_COMMUNITY): Payer: Self-pay | Admitting: Cardiology

## 2020-09-10 DIAGNOSIS — I5022 Chronic systolic (congestive) heart failure: Secondary | ICD-10-CM

## 2020-09-15 NOTE — Progress Notes (Signed)
Carelink Summary Report / Loop Recorder 

## 2020-09-24 ENCOUNTER — Other Ambulatory Visit (HOSPITAL_COMMUNITY): Payer: Self-pay | Admitting: Cardiology

## 2020-10-06 ENCOUNTER — Telehealth (HOSPITAL_COMMUNITY): Payer: Self-pay | Admitting: Vascular Surgery

## 2020-10-06 NOTE — Telephone Encounter (Signed)
Pt LVM to resch appt/ Returned pt call to resch 10/09/20 @ 3pm appt w/ [facility], asked pt to call back to resch appt

## 2020-10-09 ENCOUNTER — Encounter (HOSPITAL_COMMUNITY): Payer: Medicare HMO | Admitting: Cardiology

## 2020-10-12 ENCOUNTER — Ambulatory Visit (INDEPENDENT_AMBULATORY_CARE_PROVIDER_SITE_OTHER): Payer: Medicare HMO

## 2020-10-12 DIAGNOSIS — R55 Syncope and collapse: Secondary | ICD-10-CM

## 2020-10-14 LAB — CUP PACEART REMOTE DEVICE CHECK
Date Time Interrogation Session: 20220301231751
Implantable Pulse Generator Implant Date: 20190405

## 2020-10-15 ENCOUNTER — Other Ambulatory Visit (HOSPITAL_COMMUNITY): Payer: Self-pay | Admitting: Cardiology

## 2020-10-15 DIAGNOSIS — I5022 Chronic systolic (congestive) heart failure: Secondary | ICD-10-CM

## 2020-10-20 NOTE — Progress Notes (Signed)
Carelink Summary Report / Loop Recorder 

## 2020-10-29 ENCOUNTER — Other Ambulatory Visit (HOSPITAL_COMMUNITY): Payer: Self-pay | Admitting: Cardiology

## 2020-11-12 ENCOUNTER — Other Ambulatory Visit (HOSPITAL_COMMUNITY): Payer: Self-pay | Admitting: Cardiology

## 2020-11-12 DIAGNOSIS — I509 Heart failure, unspecified: Secondary | ICD-10-CM

## 2020-11-14 LAB — CUP PACEART REMOTE DEVICE CHECK
Date Time Interrogation Session: 20220402001927
Implantable Pulse Generator Implant Date: 20190405

## 2020-11-16 ENCOUNTER — Ambulatory Visit (INDEPENDENT_AMBULATORY_CARE_PROVIDER_SITE_OTHER): Payer: Medicare HMO

## 2020-11-16 DIAGNOSIS — I442 Atrioventricular block, complete: Secondary | ICD-10-CM

## 2020-11-24 ENCOUNTER — Other Ambulatory Visit (HOSPITAL_COMMUNITY): Payer: Self-pay | Admitting: Cardiology

## 2020-11-30 NOTE — Progress Notes (Signed)
Carelink Summary Report / Loop Recorder 

## 2020-12-01 ENCOUNTER — Telehealth (HOSPITAL_COMMUNITY): Payer: Self-pay | Admitting: *Deleted

## 2020-12-01 ENCOUNTER — Encounter (HOSPITAL_COMMUNITY): Payer: Self-pay | Admitting: Cardiology

## 2020-12-01 ENCOUNTER — Ambulatory Visit (HOSPITAL_COMMUNITY)
Admission: RE | Admit: 2020-12-01 | Discharge: 2020-12-01 | Disposition: A | Discharge status: Home / Self Care | Payer: Medicare HMO | Source: Ambulatory Visit | Attending: Cardiology | Admitting: Cardiology

## 2020-12-01 ENCOUNTER — Other Ambulatory Visit: Payer: Self-pay

## 2020-12-01 VITALS — BP 140/80 | HR 52 | Wt 258.4 lb

## 2020-12-01 DIAGNOSIS — E669 Obesity, unspecified: Secondary | ICD-10-CM | POA: Insufficient documentation

## 2020-12-01 DIAGNOSIS — Z7984 Long term (current) use of oral hypoglycemic drugs: Secondary | ICD-10-CM | POA: Diagnosis not present

## 2020-12-01 DIAGNOSIS — I252 Old myocardial infarction: Secondary | ICD-10-CM | POA: Diagnosis not present

## 2020-12-01 DIAGNOSIS — I739 Peripheral vascular disease, unspecified: Secondary | ICD-10-CM | POA: Insufficient documentation

## 2020-12-01 DIAGNOSIS — Z79899 Other long term (current) drug therapy: Secondary | ICD-10-CM | POA: Insufficient documentation

## 2020-12-01 DIAGNOSIS — Z951 Presence of aortocoronary bypass graft: Secondary | ICD-10-CM | POA: Insufficient documentation

## 2020-12-01 DIAGNOSIS — I5022 Chronic systolic (congestive) heart failure: Secondary | ICD-10-CM

## 2020-12-01 DIAGNOSIS — I2510 Atherosclerotic heart disease of native coronary artery without angina pectoris: Secondary | ICD-10-CM | POA: Diagnosis not present

## 2020-12-01 DIAGNOSIS — Z7982 Long term (current) use of aspirin: Secondary | ICD-10-CM | POA: Diagnosis not present

## 2020-12-01 DIAGNOSIS — Z6835 Body mass index (BMI) 35.0-35.9, adult: Secondary | ICD-10-CM | POA: Diagnosis not present

## 2020-12-01 DIAGNOSIS — I255 Ischemic cardiomyopathy: Secondary | ICD-10-CM | POA: Insufficient documentation

## 2020-12-01 DIAGNOSIS — E1122 Type 2 diabetes mellitus with diabetic chronic kidney disease: Secondary | ICD-10-CM | POA: Diagnosis not present

## 2020-12-01 DIAGNOSIS — E785 Hyperlipidemia, unspecified: Secondary | ICD-10-CM

## 2020-12-01 DIAGNOSIS — N183 Chronic kidney disease, stage 3 unspecified: Secondary | ICD-10-CM | POA: Diagnosis not present

## 2020-12-01 DIAGNOSIS — I4892 Unspecified atrial flutter: Secondary | ICD-10-CM | POA: Insufficient documentation

## 2020-12-01 DIAGNOSIS — R55 Syncope and collapse: Secondary | ICD-10-CM | POA: Insufficient documentation

## 2020-12-01 DIAGNOSIS — I779 Disorder of arteries and arterioles, unspecified: Secondary | ICD-10-CM | POA: Diagnosis not present

## 2020-12-01 LAB — BASIC METABOLIC PANEL
Anion gap: 10 (ref 5–15)
BUN: 16 mg/dL (ref 6–20)
CO2: 29 mmol/L (ref 22–32)
Calcium: 9.4 mg/dL (ref 8.9–10.3)
Chloride: 101 mmol/L (ref 98–111)
Creatinine, Ser: 1.49 mg/dL — ABNORMAL HIGH (ref 0.61–1.24)
GFR, Estimated: 54 mL/min — ABNORMAL LOW (ref >60)
Glucose, Bld: 122 mg/dL — ABNORMAL HIGH (ref 70–99)
Potassium: 3.7 mmol/L (ref 3.5–5.1)
Sodium: 140 mmol/L (ref 135–145)

## 2020-12-01 LAB — LIPID PANEL
Cholesterol: 120 mg/dL (ref 0–200)
HDL: 32 mg/dL — ABNORMAL LOW (ref >40)
LDL Cholesterol: 43 mg/dL (ref 0–99)
Total CHOL/HDL Ratio: 3.8 RATIO
Triglycerides: 224 mg/dL — ABNORMAL HIGH (ref <150)
VLDL: 45 mg/dL — ABNORMAL HIGH (ref 0–40)

## 2020-12-01 MED ORDER — HYDRALAZINE HCL 50 MG PO TABS: 75.0000 mg | ORAL_TABLET | Freq: Three times a day (TID) | ORAL | 3 refills | Status: DC

## 2020-12-01 MED ORDER — ISOSORBIDE MONONITRATE ER 60 MG PO TB24: 60.0000 mg | ORAL_TABLET | Freq: Every day | ORAL | 3 refills | Status: DC

## 2020-12-01 NOTE — Patient Instructions (Signed)
EKG done today.  Labs done today. We will contact you only if your labs are abnormal.  INCREASE Imdur to 60mg  (1 tablet) by mouth daily.  INCREASE Hydralazine to 75mg  (1 & 1/2 tablet) by mouth 3 times daily.  No other medication changes were made. Please continue all current medications as prescribed.  Your physician recommends that you schedule a follow-up appointment in: 4 months  Your physician has requested that you have a carotid duplex. This test is an ultrasound of the carotid arteries in your neck. It looks at blood flow through these arteries that supply the brain with blood. Allow one hour for this exam. There are no restrictions or special instructions. This has to be approved through your insurance company prior to scheduling, once approved we will contact you to schedule an appointment.   Your physician has requested that you have an ankle brachial index (ABI). During this test an ultrasound and blood pressure cuff are used to evaluate the arteries that supply the arms and legs with blood. Allow thirty minutes for this exam. There are no restrictions or special instructions.This has to be approved through your insurance company prior to scheduling, once approved we will contact you to schedule an appointment.    If you have any questions or concerns before your next appointment please send a message through [place] or call our office at [phone].    TO LEAVE A MESSAGE FOR THE NURSE SELECT OPTION 2, PLEASE LEAVE A MESSAGE INCLUDING: . YOUR NAME . DATE OF BIRTH . CALL BACK NUMBER . REASON FOR CALL**this is important as we prioritize the call backs  YOU WILL RECEIVE A CALL BACK THE SAME DAY AS LONG AS YOU CALL BEFORE 4:00 PM   Do the following things EVERYDAY: 1) Weigh yourself in the morning before breakfast. Write it down and keep it in a log. 2) Take your medicines as prescribed 3) Eat low salt foods--Limit salt (sodium) to 2000 mg per day.  4) Stay as active as  you can everyday 5) Limit all fluids for the day to less than 2 liters   At the [facility], you and your health needs are our priority. As part of our continuing mission to provide you with exceptional heart care, we have created designated Provider Care Teams. These Care Teams include your primary Cardiologist (physician) and Advanced Practice Providers (APPs- Physician Assistants and Nurse Practitioners) who all work together to provide you with the care you need, when you need it.   You may see any of the following providers on your designated Care Team at your next follow up: [place] [address] [phone] . Dr Marland Kitchen . Arvilla Meres, NP . [name], PA . Tonye Becket, PharmD   Please be sure to bring in all your medications bottles to every appointment.

## 2020-12-01 NOTE — Telephone Encounter (Signed)
Pt left vm stating he was told to increase a medication that he does not take and he needs clarification.  Philicia Branch,CMA aware and will follow up with pt.

## 2020-12-02 NOTE — Progress Notes (Signed)
ID:  Christopher [name], DOB May 15, 1961, MRN [id number]   Provider location: [facility] Type of Visit: Established patient   PCP:  Shirlean Mylar, MD  Cardiologist:  [name], MD   History of Present Illness:  Patient has a history of CAD s/p CABG, ischemic cardiomyopathy, atrial fibrillation, and CKD.  In 3/17, he came to the hospital with a late presentation inferior MI.  He was noted to have occluded RCA with collaterals and 95% proximal LAD.  He had CABG x 4 + right CEA.  He had a balloon pump with cardiogenic shock.  He additionally had multiple episodes of post-op VT/VF.  Amiodarone was started.  Repeat cath showed slow flow in the SVG-D and SVG-ramus given diffusely diseased and small distal targets.  He developed AKI.  He had prolonged mechanical ventilation and a tracheostomy.  He had RV failure that was treated with Revatio.  He had transient atrial flutter that resolved quickly.  Last echo in 3/17 showed EF 30-35%.  Finally, the tracheostomy was removed.  He went to inpatient rehab for about a week and is now home again. Echo 6/17 with EF 40-45%.   Admitted 8/21-8/27/18 with acute respiratory failure due to HCAP and HTN emergency. Also had rapid atrial flutter. Rate improved and DCCV was planned for outpatient as he had missed a few doses of Eliquis at home. Discharge weight was 270 pounds. He had DCCV to NSR in 9/18.  He was admitted again in 4/19 after syncopal episode and was found to be in atrial flutter again.  He had a LINQ monitor placed and was cardioverted back to NSR.   Atrial flutter ablation in 5/19.  He is now off Eliquis.   Echo in 7/21 showed EF 40-45%, inferior/inferoseptal hypokinesis, mildly decreased RV systolic function.   He returns today for followup of CHF and CAD.   Weight is stable.  He feels good, no significant exertional dyspnea.  No chest pain.  No orthopnea/PND.  He is going to be moving to [place].    ECG (personally reviewed):  NSR, LVH, old anterolateral and inferior MI  Labs (4/17): K 3.4, creatinine 1.6 Labs (5/17): K 4.4, creatinine 1.65, LFTs normal, TSH mildly elevated, free T3 and free T4 normal, BNP 502, hemoglobin 12.3 Labs (6/17): BNP 737, K 4.4, creatinine 1.72 Labs (7/17): LDL 136, K 4.9, creatinine 1.79 Labs (8/17): BNP 239, K 3.8, creatinine 2.17 Labs (10/17): LDL 74, HDL 34, LFTs normal Labs (3/57): K 5, creatinine 1.67, BNP 774 Labs (6/18): K 3.3, creatinine 1.82 Labs 02/06/2017: K 4.3 Creatinine 1.78.  Labs (9/18): K 4.1, creatinine 1.73 Labs (1/19): LDL 48 Labs (4/19): K 4, creatinine 1.38, hgb 15.3 Labs (9/19): K 4.1, creatinine 1.49, LDL 30, TGs 269 Labs (11/19): K 3.8, creatinine 1.43 Labs (9/20): K 3.8, creatinine 1.43, LDL 50, HDL 31, TGs 324 Labs (12/20): K 4.1, creatinine 1.4, LDL 36, TGs 245 Labs (7/21): K 3.6, creatinine 1.32 Labs (9/21): LDL 49, HDL 33, TGs 225  PMH: 1. CAD: Late presentation inferior MI in 3/17.  - LHC showed totally occluded pRCA with L=>R collaterals, 95% pLAD, 70% ramus.   - CABG with LIMA-LAD, SVG-D, SVG-ramus, SVG-RCA.  - Repeat echo in 3/17 after CABG showed patent LIMA and SVG-RCA but SVG-D and SVG- ramus had slow flow with small, diffusely diseased distal targets.  - Echo (6/17) with EF 40-45%, inferior severe hypokinesis, mildly dilated RV with normal systolic function, PASP 30 mmHg.  2. Chronic systolic CHF:  Ischemic cardiomyopathy.   - Echo (3/17) with EF 30-35%, septal and inferior AK.   - Echo (7/18): EF 40-45%, moderate FBSH, grade II diastolic dysfunction, RV mildly dilated with normal systolic function.  - Echo (4/19): EF 40-45%, mild LV dilation - Echo (7/21): EF 40-45%, inferior/inferoseptal hypokinesis, grade II diastolic dysfunction, mildly decreased RV systolic function, PASP 42 mmHg.  3. RV failure: was on Revatio, now off.  4. CKD: Stage 3.  5. Atrial flutter: Transient, post-op.   - DCCV to NSR in 9/18. - DCCV to NSR in 4/19.   6. VT:  Post-op CABG.  7. Carotid stenosis: Right CEA 3/17.  - Carotid dopplers (9/18): 1-39% LICA, s/p R CEA.  - Carotid dopplers (10/19): 1-39% LICA, s/p right CEA 8. PAD: Peripheral artery dopplers (7/17) with > 50% L CIA stenosis, occluded distal external iliac, occluded right SFA, occluded left SFA.  9. Peripheral neuropathy 10. Syncope: 4/19, now has LINQ monitor.   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   Family History  Problem Relation Age of Onset  . Heart disease Mother   . Failure to thrive Father   . Congestive Heart Failure Father        Deceased  . Breast cancer Sister        Living   Review of systems complete and found to be negative unless listed in HPI.   Current Outpatient Medications  Medication Sig Dispense Refill  . acetaminophen (TYLENOL) 500 MG tablet Take 1,000 mg by mouth every 6 (six) hours as needed for mild pain or headache.     Marland Kitchen aspirin 81 MG tablet Take 81 mg by mouth daily.    . carvedilol (COREG) 12.5 MG tablet TAKE 1 TABLET BY MOUTH TWICE DAILY WITH A MEAL . APPOINTMENT REQUIRED FOR FUTURE REFILLS 60 tablet 3  . DULoxetine (CYMBALTA) 30 MG capsule Take 30 mg by mouth daily.    Marland Kitchen ezetimibe (ZETIA) 10 MG tablet Take 1 tablet by mouth once daily 90 tablet 0  . FARXIGA 10 MG TABS tablet TAKE 1 TABLET BY MOUTH ONCE DAILY BEFORE BREAKFAST 90 tablet 0  . fenofibrate 54 MG tablet Take 1 tablet (54 mg total) by mouth daily. 90 tablet 3  . potassium chloride SA (KLOR-CON) 20 MEQ tablet Take 1 tablet by mouth once daily 90 tablet 3  . QUEtiapine (SEROQUEL) 50 MG tablet Take 25 mg by mouth at bedtime.   11  . ranitidine (ZANTAC) 150 MG tablet Take 150 mg by mouth 2 (two) times daily.    . rosuvastatin (CRESTOR) 40 MG tablet Take 1 tablet by mouth once daily 90 tablet 0  . senna-docusate (SENOKOT-S) 8.6-50 MG tablet Take 1 tablet by mouth at bedtime. For constipation 30 tablet 0  . spironolactone (ALDACTONE) 25 MG tablet Take 1  tablet by mouth once daily 90 tablet 0  . torsemide (DEMADEX) 20 MG tablet Take 2 tablets by mouth once daily 180 tablet 3  . VASCEPA 1 g capsule Take 2 capsules (2 g total) by mouth 2 (two) times daily. 360 capsule 3  . hydrALAZINE (APRESOLINE) 50 MG tablet Take 1.5 tablets (75 mg total) by mouth 3 (three) times daily. 405 tablet 3  . isosorbide mononitrate (IMDUR) 60 MG 24 hr tablet Take 1 tablet (60 mg total) by mouth daily. 90 tablet 3   No current facility-administered medications for this encounter.   Vitals:   12/01/20 1142  BP: 140/80  Pulse: (!) 52  SpO2: 97%  Weight: 117.2 kg (258 lb 6.4 oz)    Wt Readings from Last 3 Encounters:  12/01/20 117.2 kg (258 lb 6.4 oz)  02/28/20 (!) 116.6 kg (257 lb)  05/06/19 115.3 kg (254 lb 3.2 oz)    Exam:   BP 140/80   Pulse (!) 52   Wt 117.2 kg (258 lb 6.4 oz)   SpO2 97%   BMI 35.05 kg/m  General: NAD Neck: Thick, no JVD, no thyromegaly or thyroid nodule.  Lungs: Clear to auscultation bilaterally with normal respiratory effort. CV: Nondisplaced PMI.  Heart regular S1/S2, no S3/S4, no murmur.  No peripheral edema.  No carotid bruit.  Normal pedal pulses.  Abdomen: Soft, nontender, no hepatosplenomegaly, no distention.  Skin: Intact without lesions or rashes.  Neurologic: Alert and oriented x 3.  Psych: Normal affect. Extremities: No clubbing or cyanosis.  HEENT: Normal.   Assessment/Plan: 1. CAD: s/p CABG. No chest pain.    - Continue Crestor and Vascepa. Check lipids today.  - Continue ASA 81 daily.   2. Atrial flutter: Initially occurred post operatively and was transient. Noted to be in Aflutter at office visit in 7/18 and again at hospitalization in 8/18. He had DCCV in 9/18.  He saw Dr. Johney Frame but wanted to hold off on flutter ablation.  However, he had atrial flutter again in 4/19 and was cardioverted.  He is now s/p atrial flutter ablation and is in NSR.  He is now off Eliquis.   3. Chronic systolic CHF: Ischemic  cardiomyopathy.  Echo in 3/17 with EF 30-35%, improved to 40-45% on echo in 6/17.  Echo 02/2017 EF 40-45%, echo 4/19 with EF 40-45%.  Echo in 7/21 showed stable EF 40-45%.   NYHA I-II.  He is not volume overloaded on exam.  - Continue Coreg 12.5 mg bid, HR too low to titrate up.   - Increase hydralazine to 75 mg tid and Imdur to 60 mg daily with elevated BP.   - Continue torsemide 40 mg daily. BMET today.   - Continue spironolactone 25 mg daily.  - EF above ICD range.  - He had AKI with ARB and with Entresto so not on either med.   - Continue dapagliflozin.   5. VT: No further.  - Off amiodarone.  6. CKD stage III: BMET today.  7. Carotid stenosis: s/p R CEA in 2017.  - Arrange for carotid dopplers.  8. Hyperlipidemia: Continue Crestor and Vascepa.   - Check lipids. 9. PAD: Claudication improved with walking program, not markedly symptomatic at this point.  - Due for peripheral arterial dopplers, will arrange.  10. Obesity: Weight has trended down over time.   11. Syncope: Now has LINQ monitor.  No events.    Followup in 4 months.    Signed, [name], MD  12/02/2020   [facility] [address] [facility] [place] [address] [phone] (office) [phone] (fax)

## 2020-12-04 ENCOUNTER — Telehealth (HOSPITAL_COMMUNITY): Payer: Self-pay

## 2020-12-04 MED ORDER — FENOFIBRATE 145 MG PO TABS: 145.0000 mg | ORAL_TABLET | Freq: Every day | ORAL | 11 refills | Status: DC

## 2020-12-04 NOTE — Telephone Encounter (Signed)
Pt aware of results and recommendations to increase fenofibrate to 145mg  daily. Verbalized understanding.

## 2020-12-04 NOTE — Telephone Encounter (Signed)
-----   Message from Laurey Morale, MD sent at 12/01/2020  2:00 PM EDT ----- Increase fenofibrate to 135 mg daily.

## 2020-12-21 ENCOUNTER — Ambulatory Visit (INDEPENDENT_AMBULATORY_CARE_PROVIDER_SITE_OTHER): Payer: Medicare HMO

## 2020-12-21 DIAGNOSIS — R55 Syncope and collapse: Secondary | ICD-10-CM | POA: Diagnosis not present

## 2020-12-22 LAB — CUP PACEART REMOTE DEVICE CHECK
Date Time Interrogation Session: 20220514231120
Implantable Pulse Generator Implant Date: 20190405

## 2020-12-25 ENCOUNTER — Other Ambulatory Visit (HOSPITAL_COMMUNITY): Payer: Self-pay

## 2020-12-25 MED ORDER — DAPAGLIFLOZIN PROPANEDIOL 10 MG PO TABS: 10.0000 mg | ORAL_TABLET | Freq: Every day | ORAL | 1 refills | Status: DC

## 2021-01-13 NOTE — Progress Notes (Signed)
Carelink Summary Report / Loop Recorder 

## 2021-01-25 ENCOUNTER — Ambulatory Visit (INDEPENDENT_AMBULATORY_CARE_PROVIDER_SITE_OTHER): Payer: Medicare HMO

## 2021-01-25 DIAGNOSIS — R55 Syncope and collapse: Secondary | ICD-10-CM | POA: Diagnosis not present

## 2021-01-26 LAB — CUP PACEART REMOTE DEVICE CHECK
Date Time Interrogation Session: 20220614232854
Implantable Pulse Generator Implant Date: 20190405

## 2021-01-27 ENCOUNTER — Other Ambulatory Visit (HOSPITAL_COMMUNITY): Payer: Self-pay | Admitting: *Deleted

## 2021-01-27 MED ORDER — VASCEPA 1 G PO CAPS: 2.0000 g | ORAL_CAPSULE | Freq: Two times a day (BID) | ORAL | 3 refills | Status: DC

## 2021-02-03 ENCOUNTER — Other Ambulatory Visit (HOSPITAL_COMMUNITY): Payer: Self-pay | Admitting: Cardiology

## 2021-02-12 ENCOUNTER — Other Ambulatory Visit (HOSPITAL_COMMUNITY): Payer: Self-pay | Admitting: Cardiology

## 2021-02-12 DIAGNOSIS — I509 Heart failure, unspecified: Secondary | ICD-10-CM

## 2021-02-12 DIAGNOSIS — I5022 Chronic systolic (congestive) heart failure: Secondary | ICD-10-CM

## 2021-02-12 NOTE — Progress Notes (Signed)
Carelink Summary Report / Loop Recorder 

## 2021-02-24 LAB — CUP PACEART REMOTE DEVICE CHECK
Date Time Interrogation Session: 20220716002645
Implantable Pulse Generator Implant Date: 20190405

## 2021-03-02 ENCOUNTER — Other Ambulatory Visit (HOSPITAL_COMMUNITY): Payer: Self-pay | Admitting: Cardiology

## 2021-03-17 ENCOUNTER — Other Ambulatory Visit (HOSPITAL_COMMUNITY): Payer: Self-pay | Admitting: Cardiology

## 2021-03-17 DIAGNOSIS — I5022 Chronic systolic (congestive) heart failure: Secondary | ICD-10-CM

## 2021-04-02 ENCOUNTER — Encounter (HOSPITAL_COMMUNITY): Payer: Medicare HMO | Admitting: Cardiology

## 2021-04-02 ENCOUNTER — Ambulatory Visit (HOSPITAL_COMMUNITY): Admission: RE | Admit: 2021-04-02 | Payer: Medicare PPO | Source: Ambulatory Visit

## 2021-04-04 ENCOUNTER — Other Ambulatory Visit (HOSPITAL_COMMUNITY): Payer: Self-pay | Admitting: Cardiology

## 2021-04-04 DIAGNOSIS — I5022 Chronic systolic (congestive) heart failure: Secondary | ICD-10-CM

## 2021-04-07 ENCOUNTER — Encounter (HOSPITAL_COMMUNITY): Payer: Self-pay | Admitting: Cardiology

## 2021-04-07 ENCOUNTER — Other Ambulatory Visit: Payer: Self-pay

## 2021-04-07 ENCOUNTER — Ambulatory Visit (HOSPITAL_COMMUNITY)
Admission: RE | Admit: 2021-04-07 | Discharge: 2021-04-07 | Disposition: A | Discharge status: Home / Self Care | Payer: Medicare PPO | Source: Ambulatory Visit | Attending: Cardiology | Admitting: Cardiology

## 2021-04-07 VITALS — BP 130/78 | HR 56 | Wt 262.2 lb

## 2021-04-07 DIAGNOSIS — E669 Obesity, unspecified: Secondary | ICD-10-CM | POA: Insufficient documentation

## 2021-04-07 DIAGNOSIS — Z7984 Long term (current) use of oral hypoglycemic drugs: Secondary | ICD-10-CM | POA: Insufficient documentation

## 2021-04-07 DIAGNOSIS — E785 Hyperlipidemia, unspecified: Secondary | ICD-10-CM

## 2021-04-07 DIAGNOSIS — Z8249 Family history of ischemic heart disease and other diseases of the circulatory system: Secondary | ICD-10-CM | POA: Diagnosis not present

## 2021-04-07 DIAGNOSIS — N179 Acute kidney failure, unspecified: Secondary | ICD-10-CM | POA: Insufficient documentation

## 2021-04-07 DIAGNOSIS — I739 Peripheral vascular disease, unspecified: Secondary | ICD-10-CM | POA: Diagnosis not present

## 2021-04-07 DIAGNOSIS — I6529 Occlusion and stenosis of unspecified carotid artery: Secondary | ICD-10-CM | POA: Diagnosis not present

## 2021-04-07 DIAGNOSIS — Z951 Presence of aortocoronary bypass graft: Secondary | ICD-10-CM | POA: Diagnosis not present

## 2021-04-07 DIAGNOSIS — I472 Ventricular tachycardia: Secondary | ICD-10-CM | POA: Diagnosis not present

## 2021-04-07 DIAGNOSIS — I5022 Chronic systolic (congestive) heart failure: Secondary | ICD-10-CM | POA: Insufficient documentation

## 2021-04-07 DIAGNOSIS — Z6835 Body mass index (BMI) 35.0-35.9, adult: Secondary | ICD-10-CM | POA: Diagnosis not present

## 2021-04-07 DIAGNOSIS — I130 Hypertensive heart and chronic kidney disease with heart failure and stage 1 through stage 4 chronic kidney disease, or unspecified chronic kidney disease: Secondary | ICD-10-CM | POA: Insufficient documentation

## 2021-04-07 DIAGNOSIS — Z7982 Long term (current) use of aspirin: Secondary | ICD-10-CM | POA: Insufficient documentation

## 2021-04-07 DIAGNOSIS — Z79899 Other long term (current) drug therapy: Secondary | ICD-10-CM | POA: Diagnosis not present

## 2021-04-07 DIAGNOSIS — I4891 Unspecified atrial fibrillation: Secondary | ICD-10-CM | POA: Diagnosis not present

## 2021-04-07 DIAGNOSIS — G6289 Other specified polyneuropathies: Secondary | ICD-10-CM | POA: Diagnosis not present

## 2021-04-07 DIAGNOSIS — I4892 Unspecified atrial flutter: Secondary | ICD-10-CM | POA: Diagnosis not present

## 2021-04-07 DIAGNOSIS — I779 Disorder of arteries and arterioles, unspecified: Secondary | ICD-10-CM

## 2021-04-07 DIAGNOSIS — N183 Chronic kidney disease, stage 3 unspecified: Secondary | ICD-10-CM | POA: Insufficient documentation

## 2021-04-07 DIAGNOSIS — R55 Syncope and collapse: Secondary | ICD-10-CM | POA: Diagnosis not present

## 2021-04-07 DIAGNOSIS — I2510 Atherosclerotic heart disease of native coronary artery without angina pectoris: Secondary | ICD-10-CM | POA: Diagnosis not present

## 2021-04-07 LAB — LIPID PANEL
Cholesterol: 120 mg/dL (ref 0–200)
HDL: 31 mg/dL — ABNORMAL LOW (ref >40)
LDL Cholesterol: 19 mg/dL (ref 0–99)
Total CHOL/HDL Ratio: 3.9 RATIO
Triglycerides: 350 mg/dL — ABNORMAL HIGH (ref <150)
VLDL: 70 mg/dL — ABNORMAL HIGH (ref 0–40)

## 2021-04-07 LAB — BASIC METABOLIC PANEL
Anion gap: 9 (ref 5–15)
BUN: 27 mg/dL — ABNORMAL HIGH (ref 6–20)
CO2: 31 mmol/L (ref 22–32)
Calcium: 9.6 mg/dL (ref 8.9–10.3)
Chloride: 98 mmol/L (ref 98–111)
Creatinine, Ser: 2.24 mg/dL — ABNORMAL HIGH (ref 0.61–1.24)
GFR, Estimated: 33 mL/min — ABNORMAL LOW (ref >60)
Glucose, Bld: 103 mg/dL — ABNORMAL HIGH (ref 70–99)
Potassium: 3.8 mmol/L (ref 3.5–5.1)
Sodium: 138 mmol/L (ref 135–145)

## 2021-04-07 NOTE — Patient Instructions (Addendum)
Labs done today, your results will be available in MyChart, we will contact you for abnormal readings.  You have been referred to the pharmacy clinic at [facility] for weight loss medication, they will call you to discuss  Your physician recommends that you schedule a follow-up appointment in: 3 months with an echocardiogram, carotid ultrasound, and a lower extremity ultrasound  Your physician has requested that you have an echocardiogram. Echocardiography is a painless test that uses sound waves to create images of your heart. It provides your doctor with information about the size and shape of your heart and how well your heart's chambers and valves are working. This procedure takes approximately one hour. There are no restrictions for this procedure.  Your physician has requested that you have a carotid duplex. This test is an ultrasound of the carotid arteries in your neck. It looks at blood flow through these arteries that supply the brain with blood. Allow one hour for this exam. There are no restrictions or special instructions.  Your physician has requested that you have an ankle brachial index (ABI). During this test an ultrasound and blood pressure cuff are used to evaluate the arteries that supply the arms and legs with blood. Allow thirty minutes for this exam. There are no restrictions or special instructions.  If you have any questions or concerns before your next appointment please send [place] a message through [place] or call our office at [phone].    TO LEAVE A MESSAGE FOR THE NURSE SELECT OPTION 2, PLEASE LEAVE A MESSAGE INCLUDING: YOUR NAME DATE OF BIRTH CALL BACK NUMBER REASON FOR CALL**this is important as we prioritize the call backs  YOU WILL RECEIVE A CALL BACK THE SAME DAY AS LONG AS YOU CALL BEFORE 4:00 PM  At the [facility], you and your health needs are our priority. As part of our continuing mission to provide you with exceptional heart care,  we have created designated Provider Care Teams. These Care Teams include your primary Cardiologist (physician) and Advanced Practice Providers (APPs- Physician Assistants and Nurse Practitioners) who all work together to provide you with the care you need, when you need it.   You may see any of the following providers on your designated Care Team at your next follow up: Dr Arvilla Meres Dr [name] Dr Brandon Melnick, NP Robbie Lis, [place] Mikki Santee Karle Plumber, PharmD   Please be sure to bring in all your medications bottles to every appointment.

## 2021-04-08 NOTE — Progress Notes (Signed)
ID:  Christopher [name], DOB 04/28/1961, MRN [id number]   Provider location: [facility] Type of Visit: Established patient   PCP:  Shirlean Mylar, MD  Cardiologist:  [name], MD   History of Present Illness:  Patient has a history of CAD s/p CABG, ischemic cardiomyopathy, atrial fibrillation, and CKD.  In 3/17, he came to the hospital with a late presentation inferior MI.  He was noted to have occluded RCA with collaterals and 95% proximal LAD.  He had CABG x 4 + right CEA.  He had a balloon pump with cardiogenic shock.  He additionally had multiple episodes of post-op VT/VF.  Amiodarone was started.  Repeat cath showed slow flow in the SVG-D and SVG-ramus given diffusely diseased and small distal targets.  He developed AKI.  He had prolonged mechanical ventilation and a tracheostomy.  He had RV failure that was treated with Revatio.  He had transient atrial flutter that resolved quickly.  Last echo in 3/17 showed EF 30-35%.  Finally, the tracheostomy was removed.  He went to inpatient rehab for about a week and is now home again. Echo 6/17 with EF 40-45%.   Admitted 8/21-8/27/18 with acute respiratory failure due to HCAP and HTN emergency. Also had rapid atrial flutter. Rate improved and DCCV was planned for outpatient as he had missed a few doses of Eliquis at home. Discharge weight was 270 pounds. He had DCCV to NSR in 9/18.  He was admitted again in 4/19 after syncopal episode and was found to be in atrial flutter again.  He had a LINQ monitor placed and was cardioverted back to NSR.   Atrial flutter ablation in 5/19.  He is now off Eliquis.   Echo in 7/21 showed EF 40-45%, inferior/inferoseptal hypokinesis, mildly decreased RV systolic function.   He returns today for followup of CHF and CAD.   Weight is up 4 lbs.  He now lives in [place].  He has remodeled a mobile home there and is staying busy.  No chest pain.  No exertional dyspnea.  No lightheadedness.  No  claudication with normal activities.   ECG (personally reviewed): NSR, 1st degree AVB, old anterolateral and inferior MI  Labs (4/17): K 3.4, creatinine 1.6 Labs (5/17): K 4.4, creatinine 1.65, LFTs normal, TSH mildly elevated, free T3 and free T4 normal, BNP 502, hemoglobin 12.3 Labs (6/17): BNP 737, K 4.4, creatinine 1.72 Labs (7/17): LDL 136, K 4.9, creatinine 1.79 Labs (8/17): BNP 239, K 3.8, creatinine 2.17 Labs (10/17): LDL 74, HDL 34, LFTs normal Labs (4/40): K 5, creatinine 1.67, BNP 774 Labs (6/18): K 3.3, creatinine 1.82 Labs 02/06/2017: K 4.3 Creatinine 1.78.  Labs (9/18): K 4.1, creatinine 1.73 Labs (1/19): LDL 48 Labs (4/19): K 4, creatinine 1.38, hgb 15.3 Labs (9/19): K 4.1, creatinine 1.49, LDL 30, TGs 269 Labs (11/19): K 3.8, creatinine 1.43 Labs (9/20): K 3.8, creatinine 1.43, LDL 50, HDL 31, TGs 324 Labs (12/20): K 4.1, creatinine 1.4, LDL 36, TGs 245 Labs (7/21): K 3.6, creatinine 1.32 Labs (9/21): LDL 49, HDL 33, TGs 225 Labs (4/22): LDL 43, TGs 224, K 3.7, creatinine 1.49  PMH: 1. CAD: Late presentation inferior MI in 3/17.  - LHC showed totally occluded pRCA with L=>R collaterals, 95% pLAD, 70% ramus.   - CABG with LIMA-LAD, SVG-D, SVG-ramus, SVG-RCA.  - Repeat echo in 3/17 after CABG showed patent LIMA and SVG-RCA but SVG-D and SVG- ramus had slow flow with small, diffusely diseased distal targets.  -  Echo (6/17) with EF 40-45%, inferior severe hypokinesis, mildly dilated RV with normal systolic function, PASP 30 mmHg.  2. Chronic systolic CHF: Ischemic cardiomyopathy.   - Echo (3/17) with EF 30-35%, septal and inferior AK.   - Echo (7/18): EF 40-45%, moderate FBSH, grade II diastolic dysfunction, RV mildly dilated with normal systolic function.  - Echo (4/19): EF 40-45%, mild LV dilation - Echo (7/21): EF 40-45%, inferior/inferoseptal hypokinesis, grade II diastolic dysfunction, mildly decreased RV systolic function, PASP 42 mmHg.  3. RV failure: was on  Revatio, now off.  4. CKD: Stage 3.  5. Atrial flutter: Transient, post-op.   - DCCV to NSR in 9/18. - DCCV to NSR in 4/19.   6. VT: Post-op CABG.  7. Carotid stenosis: Right CEA 3/17.  - Carotid dopplers (9/18): 1-39% LICA, s/p R CEA.  - Carotid dopplers (10/19): 1-39% LICA, s/p right CEA 8. PAD: Peripheral artery dopplers (7/17) with > 50% L CIA stenosis, occluded distal external iliac, occluded right SFA, occluded left SFA.  9. Peripheral neuropathy 10. Syncope: 4/19, now has LINQ monitor.   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   Family History  Problem Relation Age of Onset   Heart disease Mother    Failure to thrive Father    Congestive Heart Failure Father        Deceased   Breast cancer Sister        Living   Review of systems complete and found to be negative unless listed in HPI.   Current Outpatient Medications  Medication Sig Dispense Refill   acetaminophen (TYLENOL) 500 MG tablet Take 1,000 mg by mouth every 6 (six) hours as needed for mild pain or headache.      aspirin 81 MG tablet Take 81 mg by mouth daily.     carvedilol (COREG) 12.5 MG tablet TAKE 1 TABLET BY MOUTH TWICE DAILY WITH FOOD . APPOINTMENT REQUIRED FOR FUTURE REFILLS 60 tablet 0   dapagliflozin propanediol (FARXIGA) 10 MG TABS tablet Take 1 tablet (10 mg total) by mouth daily before breakfast. 90 tablet 1   DULoxetine (CYMBALTA) 30 MG capsule Take 30 mg by mouth daily.     ezetimibe (ZETIA) 10 MG tablet Take 1 tablet by mouth once daily 90 tablet 0   fenofibrate (TRICOR) 145 MG tablet Take 1 tablet (145 mg total) by mouth daily. 30 tablet 11   hydrALAZINE (APRESOLINE) 50 MG tablet Take 1.5 tablets (75 mg total) by mouth 3 (three) times daily. 405 tablet 3   isosorbide mononitrate (IMDUR) 60 MG 24 hr tablet Take 1 tablet (60 mg total) by mouth daily. 90 tablet 3   potassium chloride SA (KLOR-CON) 20 MEQ tablet Take 1 tablet by mouth once daily 90 tablet 3    QUEtiapine (SEROQUEL) 50 MG tablet Take 25 mg by mouth at bedtime.   11   ranitidine (ZANTAC) 150 MG tablet Take 150 mg by mouth 2 (two) times daily.     rosuvastatin (CRESTOR) 40 MG tablet Take 1 tablet by mouth once daily 90 tablet 0   senna-docusate (SENOKOT-S) 8.6-50 MG tablet Take 1 tablet by mouth at bedtime. For constipation 30 tablet 0   spironolactone (ALDACTONE) 25 MG tablet Take 1 tablet by mouth once daily 90 tablet 0   torsemide (DEMADEX) 20 MG tablet Take 2 tablets by mouth once daily 180 tablet 3   VASCEPA 1 g capsule Take 2 capsules (2 g total) by mouth 2 (two) times daily.  360 capsule 3   No current facility-administered medications for this encounter.   Vitals:   04/07/21 1441  BP: 130/78  Pulse: (!) 56  SpO2: 96%  Weight: 118.9 kg (262 lb 3.2 oz)    Wt Readings from Last 3 Encounters:  04/07/21 118.9 kg (262 lb 3.2 oz)  12/01/20 117.2 kg (258 lb 6.4 oz)  02/28/20 (!) 116.6 kg (257 lb)    Exam:   BP 130/78   Pulse (!) 56   Wt 118.9 kg (262 lb 3.2 oz)   SpO2 96%   BMI 35.56 kg/m  General: NAD Neck: No JVD, no thyromegaly or thyroid nodule.  Lungs: Clear to auscultation bilaterally with normal respiratory effort. CV: Nondisplaced PMI.  Heart regular S1/S2, no S3/S4, no murmur.  No peripheral edema.  No carotid bruit.  Normal pedal pulses.  Abdomen: Soft, nontender, no hepatosplenomegaly, no distention.  Skin: Intact without lesions or rashes.  Neurologic: Alert and oriented x 3.  Psych: Normal affect. Extremities: No clubbing or cyanosis.  HEENT: Normal.    Assessment/Plan: 1. CAD: s/p CABG. No chest pain.    - Continue Crestor and Vascepa. Check lipids today.  - Continue ASA 81 daily.   2. Atrial flutter: Initially occurred post operatively and was transient. Noted to be in Aflutter at office visit in 7/18 and again at hospitalization in 8/18. He had DCCV in 9/18.  He had atrial flutter again in 4/19 and was cardioverted.  He is now s/p atrial flutter  ablation and is in NSR.  He is now off Eliquis.   3. Chronic systolic CHF: Ischemic cardiomyopathy.  Echo in 3/17 with EF 30-35%, improved to 40-45% on echo in 6/17.  Echo 02/2017 EF 40-45%, echo 4/19 with EF 40-45%.  Echo in 7/21 showed stable EF 40-45%.   NYHA I-II.  He is not volume overloaded on exam.  - Continue Coreg 12.5 mg bid, HR too low to titrate up.   - Continue hydralazine to 75 mg tid and Imdur 60 mg daily.   - Continue torsemide 40 mg daily. BMET today, decrease dose if creatinine higher.   - Continue spironolactone 25 mg daily.  - EF above ICD range on 7/21 echo, I will arrange for repeat echo at next appt in 3 months.  - He had AKI with ARB and with Entresto so not on either med.   - Continue dapagliflozin.   5. VT: No further.  - Off amiodarone.  6. CKD stage III: BMET today.  7. Carotid stenosis: s/p R CEA in 2017.  - Arrange for carotid dopplers.  8. Hyperlipidemia: Continue Crestor, fenofibrate, Zetia, and Vascepa.   - Check lipids. 9. PAD: Claudication improved with walking program, not markedly symptomatic at this point.  - Due for peripheral arterial dopplers, I will arrange.  10. Obesity: Still struggling to lose weight.  - I will refer to pharmacy clinic for semaglutide.   11. Syncope: Now has LINQ monitor.  No events.    Followup in 3 months with echo.    Signed, [name], MD  04/08/2021   [facility] [address] [facility] [place] [address] [phone] (office) [phone] (fax)

## 2021-04-14 ENCOUNTER — Telehealth: Payer: Self-pay

## 2021-04-14 NOTE — Telephone Encounter (Signed)
ILR alert report received. Battery status OK. Normal device function. No new symptom or tachy episodes. No new AF episodes.  There was one pause and one brady event detected but not able to view, sent to triage.  Monthly summary reports and ROV/PRN  Spoke with pt, assisted with manual transmission.  2 episodes received, both nocturnal.  This is known issue.  Per OV notes and 12/26/19 phone encoutner.  no changes.

## 2021-04-16 ENCOUNTER — Telehealth (HOSPITAL_COMMUNITY): Payer: Self-pay

## 2021-04-16 MED ORDER — TORSEMIDE 20 MG PO TABS: 20.0000 mg | ORAL_TABLET | Freq: Every day | ORAL | 3 refills | Status: DC

## 2021-04-16 NOTE — Telephone Encounter (Signed)
Spoke with patient and wife aware and agreeable and verbalizing understanding.

## 2021-04-16 NOTE — Telephone Encounter (Signed)
-----   Message from Laurey Morale, MD sent at 04/08/2021  8:39 AM EDT ----- Hold torsemide for a day then decrease to 20 mg daily with elevated creatinine.  Make sure he is taking both fenofibrate 145 mg daily and Vascepa 2 g bid.  Needs to watch diet with high triglycerides.

## 2021-04-20 ENCOUNTER — Encounter (HOSPITAL_COMMUNITY): Payer: Self-pay

## 2021-04-23 ENCOUNTER — Ambulatory Visit (INDEPENDENT_AMBULATORY_CARE_PROVIDER_SITE_OTHER): Payer: Medicare PPO

## 2021-04-23 DIAGNOSIS — R55 Syncope and collapse: Secondary | ICD-10-CM | POA: Diagnosis not present

## 2021-04-26 LAB — CUP PACEART REMOTE DEVICE CHECK
Date Time Interrogation Session: 20220916011253
Implantable Pulse Generator Implant Date: 20190405

## 2021-04-28 NOTE — Progress Notes (Signed)
Carelink Summary Report / Loop Recorder 

## 2021-05-06 ENCOUNTER — Other Ambulatory Visit (HOSPITAL_COMMUNITY): Payer: Self-pay

## 2021-05-06 MED ORDER — SPIRONOLACTONE 25 MG PO TABS: 25.0000 mg | ORAL_TABLET | Freq: Every day | ORAL | 3 refills | Status: DC

## 2021-05-18 ENCOUNTER — Other Ambulatory Visit (HOSPITAL_COMMUNITY): Payer: Self-pay | Admitting: Cardiology

## 2021-05-18 DIAGNOSIS — I5022 Chronic systolic (congestive) heart failure: Secondary | ICD-10-CM

## 2021-05-18 DIAGNOSIS — I509 Heart failure, unspecified: Secondary | ICD-10-CM

## 2021-05-26 ENCOUNTER — Ambulatory Visit (INDEPENDENT_AMBULATORY_CARE_PROVIDER_SITE_OTHER): Payer: Medicare PPO

## 2021-05-26 DIAGNOSIS — R55 Syncope and collapse: Secondary | ICD-10-CM | POA: Diagnosis not present

## 2021-05-27 LAB — CUP PACEART REMOTE DEVICE CHECK
Date Time Interrogation Session: 20221017011612
Implantable Pulse Generator Implant Date: 20190405

## 2021-05-31 NOTE — Progress Notes (Signed)
Carelink Summary Report / Loop Recorder 

## 2021-06-16 ENCOUNTER — Other Ambulatory Visit (HOSPITAL_COMMUNITY): Payer: Self-pay | Admitting: Cardiology

## 2021-06-16 DIAGNOSIS — I5022 Chronic systolic (congestive) heart failure: Secondary | ICD-10-CM

## 2021-06-22 ENCOUNTER — Other Ambulatory Visit (HOSPITAL_COMMUNITY): Payer: Self-pay | Admitting: Cardiology

## 2021-06-28 ENCOUNTER — Ambulatory Visit (HOSPITAL_BASED_OUTPATIENT_CLINIC_OR_DEPARTMENT_OTHER)
Admission: RE | Admit: 2021-06-28 | Discharge: 2021-06-28 | Disposition: A | Discharge status: Home / Self Care | Payer: Medicare PPO | Source: Ambulatory Visit | Attending: Cardiology | Admitting: Cardiology

## 2021-06-28 ENCOUNTER — Other Ambulatory Visit: Payer: Self-pay

## 2021-06-28 ENCOUNTER — Encounter (HOSPITAL_COMMUNITY): Payer: Self-pay | Admitting: Cardiology

## 2021-06-28 ENCOUNTER — Ambulatory Visit (HOSPITAL_COMMUNITY)
Admission: RE | Admit: 2021-06-28 | Discharge: 2021-06-28 | Disposition: A | Discharge status: Home / Self Care | Payer: Medicare PPO | Source: Ambulatory Visit | Attending: Cardiology | Admitting: Cardiology

## 2021-06-28 ENCOUNTER — Ambulatory Visit (INDEPENDENT_AMBULATORY_CARE_PROVIDER_SITE_OTHER): Payer: Medicare PPO

## 2021-06-28 VITALS — BP 124/60 | HR 51 | Wt 254.2 lb

## 2021-06-28 DIAGNOSIS — E785 Hyperlipidemia, unspecified: Secondary | ICD-10-CM | POA: Diagnosis not present

## 2021-06-28 DIAGNOSIS — N183 Chronic kidney disease, stage 3 unspecified: Secondary | ICD-10-CM | POA: Insufficient documentation

## 2021-06-28 DIAGNOSIS — I779 Disorder of arteries and arterioles, unspecified: Secondary | ICD-10-CM | POA: Diagnosis not present

## 2021-06-28 DIAGNOSIS — Z7982 Long term (current) use of aspirin: Secondary | ICD-10-CM | POA: Insufficient documentation

## 2021-06-28 DIAGNOSIS — Z560 Unemployment, unspecified: Secondary | ICD-10-CM | POA: Insufficient documentation

## 2021-06-28 DIAGNOSIS — Z6834 Body mass index (BMI) 34.0-34.9, adult: Secondary | ICD-10-CM | POA: Diagnosis not present

## 2021-06-28 DIAGNOSIS — R55 Syncope and collapse: Secondary | ICD-10-CM

## 2021-06-28 DIAGNOSIS — I5022 Chronic systolic (congestive) heart failure: Secondary | ICD-10-CM

## 2021-06-28 DIAGNOSIS — I2510 Atherosclerotic heart disease of native coronary artery without angina pectoris: Secondary | ICD-10-CM | POA: Insufficient documentation

## 2021-06-28 DIAGNOSIS — I4892 Unspecified atrial flutter: Secondary | ICD-10-CM | POA: Insufficient documentation

## 2021-06-28 DIAGNOSIS — E669 Obesity, unspecified: Secondary | ICD-10-CM | POA: Insufficient documentation

## 2021-06-28 DIAGNOSIS — I739 Peripheral vascular disease, unspecified: Secondary | ICD-10-CM | POA: Insufficient documentation

## 2021-06-28 DIAGNOSIS — Z951 Presence of aortocoronary bypass graft: Secondary | ICD-10-CM | POA: Insufficient documentation

## 2021-06-28 DIAGNOSIS — Z79899 Other long term (current) drug therapy: Secondary | ICD-10-CM | POA: Insufficient documentation

## 2021-06-28 DIAGNOSIS — I252 Old myocardial infarction: Secondary | ICD-10-CM | POA: Diagnosis not present

## 2021-06-28 DIAGNOSIS — I130 Hypertensive heart and chronic kidney disease with heart failure and stage 1 through stage 4 chronic kidney disease, or unspecified chronic kidney disease: Secondary | ICD-10-CM | POA: Insufficient documentation

## 2021-06-28 LAB — ECHOCARDIOGRAM COMPLETE
Area-P 1/2: 3.6 cm2
Calc EF: 51.7 %
S' Lateral: 4.4 cm
Single Plane A2C EF: 54.4 %
Single Plane A4C EF: 50.3 %

## 2021-06-28 LAB — BASIC METABOLIC PANEL
Anion gap: 9 (ref 5–15)
BUN: 26 mg/dL — ABNORMAL HIGH (ref 6–20)
CO2: 30 mmol/L (ref 22–32)
Calcium: 9.3 mg/dL (ref 8.9–10.3)
Chloride: 99 mmol/L (ref 98–111)
Creatinine, Ser: 1.8 mg/dL — ABNORMAL HIGH (ref 0.61–1.24)
GFR, Estimated: 43 mL/min — ABNORMAL LOW (ref >60)
Glucose, Bld: 101 mg/dL — ABNORMAL HIGH (ref 70–99)
Potassium: 3.6 mmol/L (ref 3.5–5.1)
Sodium: 138 mmol/L (ref 135–145)

## 2021-06-28 LAB — BRAIN NATRIURETIC PEPTIDE: B Natriuretic Peptide: 129.9 pg/mL — ABNORMAL HIGH (ref 0.0–100.0)

## 2021-06-28 NOTE — Progress Notes (Signed)
ABI and carotid duplex has been completed.   Preliminary results in CV Proc.   Aundra Millet Dalen [place] 06/28/2021 3:36 PM

## 2021-06-28 NOTE — Patient Instructions (Signed)
EKG done today.  Labs done today. We will contact you only if your labs are abnormal.  No medication changes were made. Please continue all current medications as prescribed.  Your physician recommends that you schedule a follow-up appointment in: 4 months  If you have any questions or concerns before your next appointment please send us a message through mychart or call our office at [phone].    TO LEAVE A MESSAGE FOR THE NURSE SELECT OPTION 2, PLEASE LEAVE A MESSAGE INCLUDING: YOUR NAME DATE OF BIRTH CALL BACK NUMBER REASON FOR CALL**this is important as we prioritize the call backs  YOU WILL RECEIVE A CALL BACK THE SAME DAY AS LONG AS YOU CALL BEFORE 4:00 PM   Do the following things EVERYDAY: Weigh yourself in the morning before breakfast. Write it down and keep it in a log. Take your medicines as prescribed Eat low salt foods--Limit salt (sodium) to 2000 mg per day.  Stay as active as you can everyday Limit all fluids for the day to less than 2 liters   At the [facility], you and your health needs are our priority. As part of our continuing mission to provide you with exceptional heart care, we have created designated Provider Care Teams. These Care Teams include your primary Cardiologist (physician) and Advanced Practice Providers (APPs- Physician Assistants and Nurse Practitioners) who all work together to provide you with the care you need, when you need it.   You may see any of the following providers on your designated Care Team at your next follow up: Dr Daniel Bensimhon Dr Dalton McLean Amy Clegg, NP Brittainy Simmons, PA Lauren Kemp, PharmD   Please be sure to bring in all your medications bottles to every appointment.   

## 2021-06-29 NOTE — Progress Notes (Signed)
ID:  Christopher [name], DOB [birth year], MRN [id number]   Provider location: [facility] Type of Visit: Established patient   PCP: Shirlean Mylar, MD (Inactive)  Cardiologist:  [name], MD   History of Present Illness:  Patient has a history of CAD s/p CABG, ischemic cardiomyopathy, atrial fibrillation, and CKD.  In 3/17, he came to the hospital with a late presentation inferior MI.  He was noted to have occluded RCA with collaterals and 95% proximal LAD.  He had CABG x 4 + right CEA.  He had a balloon pump with cardiogenic shock.  He additionally had multiple episodes of post-op VT/VF.  Amiodarone was started.  Repeat cath showed slow flow in the SVG-D and SVG-ramus given diffusely diseased and small distal targets.  He developed AKI.  He had prolonged mechanical ventilation and a tracheostomy.  He had RV failure that was treated with Revatio.  He had transient atrial flutter that resolved quickly.  Last echo in 3/17 showed EF 30-35%.  Finally, the tracheostomy was removed.  He went to inpatient rehab for about a week and is now home again. Echo 6/17 with EF 40-45%.   Admitted 8/21-8/27/18 with acute respiratory failure due to HCAP and HTN emergency. Also had rapid atrial flutter. Rate improved and DCCV was planned for outpatient as he had missed a few doses of Eliquis at home. Discharge weight was 270 pounds. He had DCCV to NSR in 9/18.  He was admitted again in 4/19 after syncopal episode and was found to be in atrial flutter again.  He had a LINQ monitor placed and was cardioverted back to NSR.   Atrial flutter ablation in 5/19.  He is now off Eliquis.   Echo in 7/21 showed EF 40-45%, inferior/inferoseptal hypokinesis, mildly decreased RV systolic function.  Echo was done today and reviewed, EF 45%, mild LVH and mild LV enlargement, mild RV enlargement with mildly decreased systolic function.   He returns today for followup of CHF and CAD.   Weight is down 8 lbs.  No  significant exertional dyspnea or chest pain.  He has been cutting firewood recently.  No orthopnea/PND. No lightheadedness.    ECG (personally reviewed): NSR, 1st degree AVB, old inferior MI, old ALMI  Labs (4/17): K 3.4, creatinine 1.6 Labs (5/17): K 4.4, creatinine 1.65, LFTs normal, TSH mildly elevated, free T3 and free T4 normal, BNP 502, hemoglobin 12.3 Labs (6/17): BNP 737, K 4.4, creatinine 1.72 Labs (7/17): LDL 136, K 4.9, creatinine 1.79 Labs (8/17): BNP 239, K 3.8, creatinine 2.17 Labs (10/17): LDL 74, HDL 34, LFTs normal Labs (4/94): K 5, creatinine 1.67, BNP 774 Labs (6/18): K 3.3, creatinine 1.82 Labs 02/06/2017: K 4.3 Creatinine 1.78.  Labs (9/18): K 4.1, creatinine 1.73 Labs (1/19): LDL 48 Labs (4/19): K 4, creatinine 1.38, hgb 15.3 Labs (9/19): K 4.1, creatinine 1.49, LDL 30, TGs 269 Labs (11/19): K 3.8, creatinine 1.43 Labs (9/20): K 3.8, creatinine 1.43, LDL 50, HDL 31, TGs 324 Labs (12/20): K 4.1, creatinine 1.4, LDL 36, TGs 245 Labs (7/21): K 3.6, creatinine 1.32 Labs (9/21): LDL 49, HDL 33, TGs 225 Labs (4/22): LDL 43, TGs 224, K 3.7, creatinine 1.49 Labs (8/22): TGs 350, LDL 19, K 3.8, creatinine 4.96  PMH: 1. CAD: Late presentation inferior MI in 3/17.  - LHC showed totally occluded pRCA with L=>R collaterals, 95% pLAD, 70% ramus.   - CABG with LIMA-LAD, SVG-D, SVG-ramus, SVG-RCA.  - Repeat echo in 3/17 after CABG showed  patent LIMA and SVG-RCA but SVG-D and SVG- ramus had slow flow with small, diffusely diseased distal targets.  - Echo (6/17) with EF 40-45%, inferior severe hypokinesis, mildly dilated RV with normal systolic function, PASP 30 mmHg.  2. Chronic systolic CHF: Ischemic cardiomyopathy.   - Echo (3/17) with EF 30-35%, septal and inferior AK.   - Echo (7/18): EF 40-45%, moderate FBSH, grade II diastolic dysfunction, RV mildly dilated with normal systolic function.  - Echo (4/19): EF 40-45%, mild LV dilation - Echo (7/21): EF 40-45%,  inferior/inferoseptal hypokinesis, grade II diastolic dysfunction, mildly decreased RV systolic function, PASP 42 mmHg.  - Echo (11/22): EF 45%, mild LVH and mild LV enlargement, mild RV enlargement with mildly decreased systolic function. 3. RV failure: was on Revatio, now off.  4. CKD: Stage 3.  5. Atrial flutter: Transient, post-op.   - DCCV to NSR in 9/18. - DCCV to NSR in 4/19.   6. VT: Post-op CABG.  7. Carotid stenosis: Right CEA 3/17.  - Carotid dopplers ([id number]): 123456 LICA, s/p R CEA.  - Carotid dopplers ([id number]): 123456 LICA, s/p right CEA 8. PAD: Peripheral artery dopplers (7/17) with > 50% L CIA stenosis, occluded distal external iliac, occluded right SFA, occluded left SFA.  9. Peripheral neuropathy 10. Syncope: 4/19, now has LINQ monitor.   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   Family History  Problem Relation Age of Onset   Heart disease Mother    Failure to thrive Father    Congestive Heart Failure Father        Deceased   Breast cancer Sister        Living   Review of systems complete and found to be negative unless listed in HPI.   Current Outpatient Medications  Medication Sig Dispense Refill   acetaminophen (TYLENOL) 500 MG tablet Take 1,000 mg by mouth every 6 (six) hours as needed for mild pain or headache.      aspirin 81 MG tablet Take 81 mg by mouth daily.     carvedilol (COREG) 12.5 MG tablet Take 1 tablet (12.5 mg total) by mouth 2 (two) times daily with a meal. 60 tablet 11   DULoxetine (CYMBALTA) 30 MG capsule Take 30 mg by mouth daily.     ezetimibe (ZETIA) 10 MG tablet Take 1 tablet by mouth once daily 90 tablet 0   FARXIGA 10 MG TABS tablet TAKE 1 TABLET BY MOUTH ONCE DAILY BEFORE BREAKFAST 90 tablet 3   fenofibrate (TRICOR) 145 MG tablet Take 1 tablet (145 mg total) by mouth daily. 30 tablet 11   hydrALAZINE (APRESOLINE) 50 MG tablet Take 1.5 tablets (75 mg total) by mouth 3 (three) times daily. 405 tablet 3    isosorbide mononitrate (IMDUR) 60 MG 24 hr tablet Take 1 tablet (60 mg total) by mouth daily. 90 tablet 3   potassium chloride SA (KLOR-CON) 20 MEQ tablet Take 1 tablet by mouth once daily 90 tablet 3   QUEtiapine (SEROQUEL) 50 MG tablet Take 25 mg by mouth at bedtime.   11   ranitidine (ZANTAC) 150 MG tablet Take 150 mg by mouth 2 (two) times daily.     rosuvastatin (CRESTOR) 40 MG tablet Take 1 tablet by mouth once daily 90 tablet 0   senna-docusate (SENOKOT-S) 8.6-50 MG tablet Take 1 tablet by mouth at bedtime. For constipation 30 tablet 0   spironolactone (ALDACTONE) 25 MG tablet Take 1 tablet (25 mg total) by mouth daily. [place]  tablet 3   torsemide (DEMADEX) 20 MG tablet Take 1 tablet (20 mg total) by mouth daily. 180 tablet 3   VASCEPA 1 g capsule Take 2 capsules (2 g total) by mouth 2 (two) times daily. 360 capsule 3   No current facility-administered medications for this encounter.   Vitals:   06/28/21 1421  BP: 124/60  Pulse: (!) 51  SpO2: 97%  Weight: 115.3 kg (254 lb 3.2 oz)    Wt Readings from Last 3 Encounters:  06/28/21 115.3 kg (254 lb 3.2 oz)  04/07/21 118.9 kg (262 lb 3.2 oz)  12/01/20 117.2 kg (258 lb 6.4 oz)    Exam:   BP 124/60   Pulse (!) 51   Wt 115.3 kg (254 lb 3.2 oz)   SpO2 97%   BMI 34.48 kg/m  General: NAD Neck: Thick. No JVD, no thyromegaly or thyroid nodule.  Lungs: Clear to auscultation bilaterally with normal respiratory effort. CV: Nondisplaced PMI.  Heart regular S1/S2, no S3/S4, no murmur.  No peripheral edema.  No carotid bruit.  Normal pedal pulses.  Abdomen: Soft, nontender, no hepatosplenomegaly, no distention.  Skin: Intact without lesions or rashes.  Neurologic: Alert and oriented x 3.  Psych: Normal affect. Extremities: No clubbing or cyanosis.  HEENT: Normal.   Assessment/Plan: 1. CAD: s/p CABG. No chest pain.    - Continue Crestor and Vascepa.  - Continue ASA 81 daily.   2. Atrial flutter: Initially occurred post operatively and  was transient. Noted to be in Aflutter at office visit in 7/18 and again at hospitalization in 8/18. He had DCCV in 9/18.  He had atrial flutter again in 4/19 and was cardioverted.  He is now s/p atrial flutter ablation and is in NSR.  He is now off Eliquis.   3. Chronic systolic CHF: Ischemic cardiomyopathy.  Echo in 3/17 with EF 30-35%, improved to 40-45% on echo in 6/17.  Echo 02/2017 EF 40-45%, echo 4/19 with EF 40-45%.  Echo in 7/21 showed stable EF 40-45%.  Echo today showed EF 45% with mild RV dysfunction.  NYHA I-II.  He is not volume overloaded on exam.  - Continue Coreg 12.5 mg bid, HR too low to titrate up.   - Continue hydralazine to 75 mg tid and Imdur 60 mg daily.   - Continue torsemide 20 mg daily. BMET today, can decrease torsemide further if creatinine remains elevated.   - Continue spironolactone 25 mg daily.  - EF above ICD range.  - He had AKI with ARB and with Entresto so not on either med.   - Continue dapagliflozin.   5. VT: No further.  - Off amiodarone.  6. CKD stage III: BMET today.  7. Carotid stenosis: s/p R CEA in 2017.  - Carotid dopplers today.  8. Hyperlipidemia: Continue Crestor, fenofibrate, Zetia, and Vascepa.   9. PAD: Claudication improved with walking program, not markedly symptomatic at this point.  - Due for peripheral arterial dopplers, will get later today.  10. Obesity: He has been losing weight.    11. Syncope: Now has LINQ monitor.  No events.    Followup in 4 months.    Signed, [name], MD  06/29/2021   [facility]  [address] [facility] [place] [address] [phone] (office) [phone] (fax)

## 2021-07-05 LAB — CUP PACEART REMOTE DEVICE CHECK
Date Time Interrogation Session: 20221117001922
Implantable Pulse Generator Implant Date: 20190405

## 2021-07-06 NOTE — Progress Notes (Signed)
Carelink Summary Report / Loop Recorder 

## 2021-07-26 ENCOUNTER — Ambulatory Visit (INDEPENDENT_AMBULATORY_CARE_PROVIDER_SITE_OTHER): Payer: Medicare PPO

## 2021-07-26 DIAGNOSIS — R55 Syncope and collapse: Secondary | ICD-10-CM

## 2021-07-27 LAB — CUP PACEART REMOTE DEVICE CHECK
Date Time Interrogation Session: 20221218002458
Implantable Pulse Generator Implant Date: 20190405

## 2021-08-04 NOTE — Progress Notes (Signed)
Carelink Summary Report / Loop Recorder 

## 2021-08-13 ENCOUNTER — Telehealth: Payer: Self-pay

## 2021-08-13 NOTE — Telephone Encounter (Signed)
LINQ alert received. Device has reached RRT 1/5 Route to triage  Successful telephone encounter to patient to discuss linq battery RRT status. All questions answered. Patient provided option to explant or leave intact. Patient wishes to leave device implanted at this time. Address confirmed. Return kit sent. All future remote monitoring appointments cancelled. Patient marked inactive in [employer], discontinued in [place]. Patient is provided device clinic contact in the event he decides to have device explanted. Patient thankful for follow up.

## 2021-08-17 ENCOUNTER — Other Ambulatory Visit (HOSPITAL_COMMUNITY): Payer: Self-pay | Admitting: Cardiology

## 2021-08-17 DIAGNOSIS — I509 Heart failure, unspecified: Secondary | ICD-10-CM

## 2021-10-27 ENCOUNTER — Encounter (HOSPITAL_COMMUNITY): Payer: Self-pay | Admitting: Cardiology

## 2021-10-27 ENCOUNTER — Ambulatory Visit (HOSPITAL_COMMUNITY)
Admission: RE | Admit: 2021-10-27 | Discharge: 2021-10-27 | Disposition: A | Discharge status: Home / Self Care | Payer: Medicare PPO | Source: Ambulatory Visit | Attending: Cardiology | Admitting: Cardiology

## 2021-10-27 ENCOUNTER — Other Ambulatory Visit: Payer: Self-pay

## 2021-10-27 VITALS — BP 140/80 | HR 52 | Wt 251.8 lb

## 2021-10-27 DIAGNOSIS — E785 Hyperlipidemia, unspecified: Secondary | ICD-10-CM | POA: Diagnosis not present

## 2021-10-27 DIAGNOSIS — Z8249 Family history of ischemic heart disease and other diseases of the circulatory system: Secondary | ICD-10-CM | POA: Insufficient documentation

## 2021-10-27 DIAGNOSIS — I252 Old myocardial infarction: Secondary | ICD-10-CM | POA: Diagnosis not present

## 2021-10-27 DIAGNOSIS — I4892 Unspecified atrial flutter: Secondary | ICD-10-CM | POA: Insufficient documentation

## 2021-10-27 DIAGNOSIS — Z951 Presence of aortocoronary bypass graft: Secondary | ICD-10-CM | POA: Insufficient documentation

## 2021-10-27 DIAGNOSIS — Z79899 Other long term (current) drug therapy: Secondary | ICD-10-CM | POA: Insufficient documentation

## 2021-10-27 DIAGNOSIS — E669 Obesity, unspecified: Secondary | ICD-10-CM | POA: Insufficient documentation

## 2021-10-27 DIAGNOSIS — I2510 Atherosclerotic heart disease of native coronary artery without angina pectoris: Secondary | ICD-10-CM | POA: Insufficient documentation

## 2021-10-27 DIAGNOSIS — Z7982 Long term (current) use of aspirin: Secondary | ICD-10-CM | POA: Diagnosis not present

## 2021-10-27 DIAGNOSIS — I4891 Unspecified atrial fibrillation: Secondary | ICD-10-CM | POA: Diagnosis not present

## 2021-10-27 DIAGNOSIS — I5022 Chronic systolic (congestive) heart failure: Secondary | ICD-10-CM | POA: Diagnosis not present

## 2021-10-27 DIAGNOSIS — I255 Ischemic cardiomyopathy: Secondary | ICD-10-CM | POA: Diagnosis not present

## 2021-10-27 DIAGNOSIS — Z6834 Body mass index (BMI) 34.0-34.9, adult: Secondary | ICD-10-CM | POA: Insufficient documentation

## 2021-10-27 DIAGNOSIS — I130 Hypertensive heart and chronic kidney disease with heart failure and stage 1 through stage 4 chronic kidney disease, or unspecified chronic kidney disease: Secondary | ICD-10-CM | POA: Insufficient documentation

## 2021-10-27 DIAGNOSIS — I739 Peripheral vascular disease, unspecified: Secondary | ICD-10-CM | POA: Diagnosis not present

## 2021-10-27 DIAGNOSIS — N183 Chronic kidney disease, stage 3 unspecified: Secondary | ICD-10-CM | POA: Insufficient documentation

## 2021-10-27 LAB — BASIC METABOLIC PANEL
Anion gap: 8 (ref 5–15)
BUN: 18 mg/dL (ref 6–20)
CO2: 30 mmol/L (ref 22–32)
Calcium: 9.2 mg/dL (ref 8.9–10.3)
Chloride: 103 mmol/L (ref 98–111)
Creatinine, Ser: 1.85 mg/dL — ABNORMAL HIGH (ref 0.61–1.24)
GFR, Estimated: 41 mL/min — ABNORMAL LOW (ref >60)
Glucose, Bld: 98 mg/dL (ref 70–99)
Potassium: 4 mmol/L (ref 3.5–5.1)
Sodium: 141 mmol/L (ref 135–145)

## 2021-10-27 LAB — LIPID PANEL
Cholesterol: 105 mg/dL (ref 0–200)
HDL: 31 mg/dL — ABNORMAL LOW (ref >40)
LDL Cholesterol: 21 mg/dL (ref 0–99)
Total CHOL/HDL Ratio: 3.4 RATIO
Triglycerides: 264 mg/dL — ABNORMAL HIGH (ref <150)
VLDL: 53 mg/dL — ABNORMAL HIGH (ref 0–40)

## 2021-10-27 NOTE — Patient Instructions (Signed)
There has been no changes to your medications. ? ?Labs done today, your results will be available in MyChart, we will contact you for abnormal readings. ? ?Your physician recommends that you schedule a follow-up appointment in: 4 months (July 2023) ** please call the office in May to arrange your follow up appointment** ? ?If you have any questions or concerns before your next appointment please send us a message through mychart or call our office at [phone].   ? ?TO LEAVE A MESSAGE FOR THE NURSE SELECT OPTION 2, PLEASE LEAVE A MESSAGE INCLUDING: ?YOUR NAME ?DATE OF BIRTH ?CALL BACK NUMBER ?REASON FOR CALL**this is important as we prioritize the call backs ? ?YOU WILL RECEIVE A CALL BACK THE SAME DAY AS LONG AS YOU CALL BEFORE 4:00 PM ? ?At the [facility], you and your health needs are our priority. As part of our continuing mission to provide you with exceptional heart care, we have created designated Provider Care Teams. These Care Teams include your primary Cardiologist (physician) and Advanced Practice Providers (APPs- Physician Assistants and Nurse Practitioners) who all work together to provide you with the care you need, when you need it.  ? ?You may see any of the following providers on your designated Care Team at your next follow up: ?Dr Daniel Bensimhon ?Dr Dalton McLean ?Amy Clegg, NP ?Brittainy Simmons, PA ?Jessica Milford,NP ?Lindsay Finch, PA ?Lauren Kemp, PharmD ? ? ?Please be sure to bring in all your medications bottles to every appointment.  ? ? ?

## 2021-10-28 NOTE — Progress Notes (Signed)
ID:  Christopher [name], DOB Oct 16, 1960, MRN [id number]   ?Provider location: [facility] ?Type of Visit: Established patient  ? ?PCP: Maurice Small, MD  ?Cardiologist:  Loralie Champagne, MD ?  ?History of Present Illness: ? ?Patient has a history of CAD s/p CABG, ischemic cardiomyopathy, atrial fibrillation, and CKD.  In 3/17, he came to the hospital with a late presentation inferior MI.  He was noted to have occluded RCA with collaterals and 95% proximal LAD.  He had CABG x 4 + right CEA.  He had a balloon pump with cardiogenic shock.  He additionally had multiple episodes of post-op VT/VF.  Amiodarone was started.  Repeat cath showed slow flow in the SVG-D and SVG-ramus given diffusely diseased and small distal targets.  He developed AKI.  He had prolonged mechanical ventilation and a tracheostomy.  He had RV failure that was treated with Revatio.  He had transient atrial flutter that resolved quickly.  Last echo in 3/17 showed EF 30-35%.  Finally, the tracheostomy was removed.  He went to inpatient rehab for about a week and is now home again. Echo 6/17 with EF 40-45%.  ? ?Admitted 8/21-8/27/18 with acute respiratory failure due to HCAP and HTN emergency. Also had rapid atrial flutter. Rate improved and DCCV was planned for outpatient as he had missed a few doses of Eliquis at home. Discharge weight was 270 pounds. He had DCCV to NSR in 9/18. ? ?He was admitted again in 4/19 after syncopal episode and was found to be in atrial flutter again.  He had a LINQ monitor placed and was cardioverted back to NSR.  ? ?Atrial flutter ablation in 5/19.  He is now off Eliquis.  ? ?Echo in 7/21 showed EF 40-45%, inferior/inferoseptal hypokinesis, mildly decreased RV systolic function.  Echo in 11/22 showed EF 45%, mild LVH and mild LV enlargement, mild RV enlargement with mildly decreased systolic function.  ? ?He returns today for followup of CHF and CAD.   Weight down 3 lbs.  Joined [employer] and working out  several times/week. No exertional dyspnea or chest pain.  Insurance would not cover semaglutide. He gets cramps in his calves with walking but he walks through the pain.   ? ?ECG (personally reviewed): NSR, 1st degree AVB, old inferior MI, old ALMI, LVH with repolarization.  ? ?Labs (4/17): K 3.4, creatinine 1.6 ?Labs (5/17): K 4.4, creatinine 1.65, LFTs normal, TSH mildly elevated, free T3 and free T4 normal, BNP 502, hemoglobin 12.3 ?Labs (6/17): BNP 737, K 4.4, creatinine 1.72 ?Labs (7/17): LDL 136, K 4.9, creatinine 1.79 ?Labs (8/17): BNP 239, K 3.8, creatinine 2.17 ?Labs (10/17): LDL 74, HDL 34, LFTs normal ?Labs (1/18): K 5, creatinine 1.67, BNP 774 ?Labs (6/18): K 3.3, creatinine 1.82 ?Labs 02/06/2017: K 4.3 Creatinine 1.78.  ?Labs (9/18): K 4.1, creatinine 1.73 ?Labs (1/19): LDL 48 ?Labs (4/19): K 4, creatinine 1.38, hgb 15.3 ?Labs (9/19): K 4.1, creatinine 1.49, LDL 30, TGs 269 ?Labs (11/19): K 3.8, creatinine 1.43 ?Labs (9/20): K 3.8, creatinine 1.43, LDL 50, HDL 31, TGs 324 ?Labs (12/20): K 4.1, creatinine 1.4, LDL 36, TGs 245 ?Labs (7/21): K 3.6, creatinine 1.32 ?Labs (9/21): LDL 49, HDL 33, TGs 225 ?Labs (4/22): LDL 43, TGs 224, K 3.7, creatinine 1.49 ?Labs (8/22): TGs 350, LDL 19, K 3.8, creatinine 2.24 ?Labs (11/22): K 3.6, creatinine 1.8, BNP 130 ? ?PMH: ?1. CAD: Late presentation inferior MI in 3/17.  ?- LHC showed totally occluded pRCA with L=>R collaterals,  95% pLAD, 70% ramus.   ?- CABG with LIMA-LAD, SVG-D, SVG-ramus, SVG-RCA.  ?- Repeat echo in 3/17 after CABG showed patent LIMA and SVG-RCA but SVG-D and SVG- ramus had slow flow with small, diffusely diseased distal targets.  ?- Echo (6/17) with EF 40-45%, inferior severe hypokinesis, mildly dilated RV with normal systolic function, PASP 30 mmHg.  ?2. Chronic systolic CHF: Ischemic cardiomyopathy.   ?- Echo (3/17) with EF 30-35%, septal and inferior AK.   ?- Echo (7/18): EF 40-45%, moderate FBSH, grade II diastolic dysfunction, RV mildly dilated  with normal systolic function.  ?- Echo (4/19): EF 40-45%, mild LV dilation ?- Echo (7/21): EF 40-45%, inferior/inferoseptal hypokinesis, grade II diastolic dysfunction, mildly decreased RV systolic function, PASP 42 mmHg.  ?- Echo (11/22): EF 45%, mild LVH and mild LV enlargement, mild RV enlargement with mildly decreased systolic function. ?3. RV failure: was on Revatio, now off.  ?4. CKD: Stage 3.  ?5. Atrial flutter: Transient, post-op.   ?- DCCV to NSR in 9/18. ?- DCCV to NSR in 4/19.   ?6. VT: Post-op CABG.  ?7. Carotid stenosis: Right CEA 3/17.  ?- Carotid dopplers ([id number]): 123456 LICA, s/p R CEA.  ?- Carotid dopplers ([id number]): 123456 LICA, s/p right CEA ?- Carotid dopplers (11/22): Mild disease.  ?8. PAD: Peripheral artery dopplers (7/17) with > 50% L CIA stenosis, occluded distal external iliac, occluded right SFA, occluded left SFA.  ?- ABIs (11/22): moderate disease with ABI 0.74 R, 0.64 L ?9. Peripheral neuropathy ?10. Syncope: 4/19, now has LINQ monitor.  ? 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  ? ?Family History  ?Problem Relation Age of Onset  ? Heart disease Mother   ? Failure to thrive Father   ? Congestive Heart Failure Father   ?     Deceased  ? Breast cancer Sister   ?     Living  ? ?Review of systems complete and found to be negative unless listed in HPI.  ? ?Current Outpatient Medications  ?Medication Sig Dispense Refill  ? acetaminophen (TYLENOL) 500 MG tablet Take 1,000 mg by mouth every 6 (six) hours as needed for mild pain or headache.     ? aspirin 81 MG tablet Take 81 mg by mouth daily.    ? carvedilol (COREG) 12.5 MG tablet Take 1 tablet (12.5 mg total) by mouth 2 (two) times daily with a meal. 60 tablet 11  ? DULoxetine (CYMBALTA) 30 MG capsule Take 30 mg by mouth daily.    ? ezetimibe (ZETIA) 10 MG tablet Take 1 tablet by mouth once daily 90 tablet 0  ? FARXIGA 10 MG TABS tablet TAKE 1 TABLET BY MOUTH ONCE DAILY BEFORE BREAKFAST 90 tablet 3  ?  fenofibrate (TRICOR) 145 MG tablet Take 1 tablet (145 mg total) by mouth daily. 30 tablet 11  ? hydrALAZINE (APRESOLINE) 50 MG tablet Take 1.5 tablets (75 mg total) by mouth 3 (three) times daily. 405 tablet 3  ? isosorbide mononitrate (IMDUR) 60 MG 24 hr tablet Take 1 tablet (60 mg total) by mouth daily. 90 tablet 3  ? potassium chloride SA (KLOR-CON) 20 MEQ tablet Take 1 tablet by mouth once daily 90 tablet 3  ? QUEtiapine (SEROQUEL) 50 MG tablet Take 25 mg by mouth at bedtime.   11  ? ranitidine (ZANTAC) 150 MG tablet Take 150 mg by mouth 2 (two) times daily.    ? rosuvastatin (CRESTOR) 40 MG tablet Take 1 tablet by mouth once  daily 90 tablet 0  ? senna-docusate (SENOKOT-S) 8.6-50 MG tablet Take 1 tablet by mouth at bedtime. For constipation 30 tablet 0  ? spironolactone (ALDACTONE) 25 MG tablet Take 1 tablet (25 mg total) by mouth daily. 90 tablet 3  ? torsemide (DEMADEX) 20 MG tablet Take 1 tablet (20 mg total) by mouth daily. 180 tablet 3  ? VASCEPA 1 g capsule Take 2 capsules (2 g total) by mouth 2 (two) times daily. 360 capsule 3  ? ?No current facility-administered medications for this encounter.  ? ?Vitals:  ? 10/27/21 1422  ?BP: 140/80  ?Pulse: (!) 52  ?SpO2: 97%  ?Weight: 114.2 kg (251 lb 12.8 oz)  ?  ?Wt Readings from Last 3 Encounters:  ?10/27/21 114.2 kg (251 lb 12.8 oz)  ?06/28/21 115.3 kg (254 lb 3.2 oz)  ?04/07/21 118.9 kg (262 lb 3.2 oz)  ?  ?Exam:   ?BP 140/80   Pulse (!) 52   Wt 114.2 kg (251 lb 12.8 oz)   SpO2 97%   BMI 34.15 kg/m?  ?General: NAD ?Neck: No JVD, no thyromegaly or thyroid nodule.  ?Lungs: Clear to auscultation bilaterally with normal respiratory effort. ?CV: Nondisplaced PMI.  Heart regular S1/S2, no S3/S4, no murmur.  No peripheral edema.  No carotid bruit.  Normal pedal pulses.  ?Abdomen: Soft, nontender, no hepatosplenomegaly, no distention.  ?Skin: Intact without lesions or rashes.  ?Neurologic: Alert and oriented x 3.  ?Psych: Normal affect. ?Extremities: No clubbing or  cyanosis.  ?HEENT: Normal.  ? ?Assessment/Plan: ?1. CAD: s/p CABG. No chest pain.    ?- Continue Crestor, Zetia and Vascepa.  ?- Continue ASA 81 daily.   ?2. Atrial flutter: Initially occurred post operatively an

## 2021-11-03 ENCOUNTER — Other Ambulatory Visit (HOSPITAL_COMMUNITY): Payer: Self-pay | Admitting: Cardiology

## 2021-11-11 DIAGNOSIS — F5101 Primary insomnia: Secondary | ICD-10-CM | POA: Diagnosis not present

## 2021-11-11 DIAGNOSIS — Z6835 Body mass index (BMI) 35.0-35.9, adult: Secondary | ICD-10-CM | POA: Diagnosis not present

## 2021-11-11 DIAGNOSIS — K219 Gastro-esophageal reflux disease without esophagitis: Secondary | ICD-10-CM | POA: Diagnosis not present

## 2021-11-11 DIAGNOSIS — Z7189 Other specified counseling: Secondary | ICD-10-CM | POA: Diagnosis not present

## 2021-11-11 DIAGNOSIS — F1290 Cannabis use, unspecified, uncomplicated: Secondary | ICD-10-CM | POA: Diagnosis not present

## 2021-11-11 DIAGNOSIS — G629 Polyneuropathy, unspecified: Secondary | ICD-10-CM | POA: Diagnosis not present

## 2021-11-16 ENCOUNTER — Other Ambulatory Visit (HOSPITAL_COMMUNITY): Payer: Self-pay | Admitting: Cardiology

## 2021-11-16 DIAGNOSIS — I509 Heart failure, unspecified: Secondary | ICD-10-CM

## 2021-11-23 ENCOUNTER — Other Ambulatory Visit (HOSPITAL_COMMUNITY): Payer: Self-pay | Admitting: Cardiology

## 2021-12-28 ENCOUNTER — Other Ambulatory Visit (HOSPITAL_COMMUNITY): Payer: Self-pay | Admitting: Cardiology

## 2022-01-24 ENCOUNTER — Other Ambulatory Visit (HOSPITAL_COMMUNITY): Payer: Self-pay | Admitting: Cardiology

## 2022-01-25 ENCOUNTER — Other Ambulatory Visit (HOSPITAL_COMMUNITY): Payer: Self-pay | Admitting: Cardiology

## 2022-02-12 ENCOUNTER — Other Ambulatory Visit (HOSPITAL_COMMUNITY): Payer: Self-pay | Admitting: Cardiology

## 2022-02-17 ENCOUNTER — Other Ambulatory Visit (HOSPITAL_COMMUNITY): Payer: Self-pay | Admitting: Cardiology

## 2022-02-22 ENCOUNTER — Other Ambulatory Visit (HOSPITAL_COMMUNITY): Payer: Self-pay | Admitting: Cardiology

## 2022-03-01 ENCOUNTER — Other Ambulatory Visit (HOSPITAL_COMMUNITY): Payer: Self-pay | Admitting: Cardiology

## 2022-03-16 ENCOUNTER — Encounter (HOSPITAL_COMMUNITY): Payer: Medicare PPO | Admitting: Cardiology

## 2022-04-08 ENCOUNTER — Ambulatory Visit (HOSPITAL_COMMUNITY)
Admission: RE | Admit: 2022-04-08 | Discharge: 2022-04-08 | Disposition: A | Discharge status: Home / Self Care | Payer: Medicare PPO | Source: Ambulatory Visit | Attending: Cardiology | Admitting: Cardiology

## 2022-04-08 ENCOUNTER — Encounter (HOSPITAL_COMMUNITY): Payer: Self-pay | Admitting: Cardiology

## 2022-04-08 VITALS — BP 110/70 | HR 56 | Wt 254.2 lb

## 2022-04-08 DIAGNOSIS — N183 Chronic kidney disease, stage 3 unspecified: Secondary | ICD-10-CM | POA: Diagnosis not present

## 2022-04-08 DIAGNOSIS — Z6834 Body mass index (BMI) 34.0-34.9, adult: Secondary | ICD-10-CM | POA: Diagnosis not present

## 2022-04-08 DIAGNOSIS — Z7984 Long term (current) use of oral hypoglycemic drugs: Secondary | ICD-10-CM | POA: Insufficient documentation

## 2022-04-08 DIAGNOSIS — Z7902 Long term (current) use of antithrombotics/antiplatelets: Secondary | ICD-10-CM | POA: Diagnosis not present

## 2022-04-08 DIAGNOSIS — Z7982 Long term (current) use of aspirin: Secondary | ICD-10-CM | POA: Insufficient documentation

## 2022-04-08 DIAGNOSIS — E785 Hyperlipidemia, unspecified: Secondary | ICD-10-CM | POA: Diagnosis not present

## 2022-04-08 DIAGNOSIS — I5022 Chronic systolic (congestive) heart failure: Secondary | ICD-10-CM

## 2022-04-08 DIAGNOSIS — I130 Hypertensive heart and chronic kidney disease with heart failure and stage 1 through stage 4 chronic kidney disease, or unspecified chronic kidney disease: Secondary | ICD-10-CM | POA: Insufficient documentation

## 2022-04-08 DIAGNOSIS — I252 Old myocardial infarction: Secondary | ICD-10-CM | POA: Diagnosis not present

## 2022-04-08 DIAGNOSIS — E669 Obesity, unspecified: Secondary | ICD-10-CM | POA: Diagnosis not present

## 2022-04-08 DIAGNOSIS — I4892 Unspecified atrial flutter: Secondary | ICD-10-CM | POA: Insufficient documentation

## 2022-04-08 DIAGNOSIS — Z79899 Other long term (current) drug therapy: Secondary | ICD-10-CM | POA: Diagnosis not present

## 2022-04-08 DIAGNOSIS — I2510 Atherosclerotic heart disease of native coronary artery without angina pectoris: Secondary | ICD-10-CM | POA: Diagnosis not present

## 2022-04-08 DIAGNOSIS — Z951 Presence of aortocoronary bypass graft: Secondary | ICD-10-CM | POA: Insufficient documentation

## 2022-04-08 DIAGNOSIS — I739 Peripheral vascular disease, unspecified: Secondary | ICD-10-CM | POA: Diagnosis not present

## 2022-04-08 LAB — BASIC METABOLIC PANEL
Anion gap: 8 (ref 5–15)
BUN: 20 mg/dL (ref 6–20)
CO2: 28 mmol/L (ref 22–32)
Calcium: 9.3 mg/dL (ref 8.9–10.3)
Chloride: 102 mmol/L (ref 98–111)
Creatinine, Ser: 1.87 mg/dL — ABNORMAL HIGH (ref 0.61–1.24)
GFR, Estimated: 41 mL/min — ABNORMAL LOW (ref >60)
Glucose, Bld: 91 mg/dL (ref 70–99)
Potassium: 3.7 mmol/L (ref 3.5–5.1)
Sodium: 138 mmol/L (ref 135–145)

## 2022-04-08 NOTE — Patient Instructions (Signed)
There has been no changes to your medications  Labs done today, your results will be available in MyChart, we will contact you for abnormal readings.  Your physician has requested that you have an echocardiogram. Echocardiography is a painless test that uses sound waves to create images of your heart. It provides your doctor with information about the size and shape of your heart and how well your heart's chambers and valves are working. This procedure takes approximately one hour. There are no restrictions for this procedure.  Your physician recommends that you schedule a follow-up appointment in: 6 months ( March 2024)  ** please call the office in January to arrange your follow up appointment **  If you have any questions or concerns before your next appointment please send us a message through mychart or call our office at [phone].    TO LEAVE A MESSAGE FOR THE NURSE SELECT OPTION 2, PLEASE LEAVE A MESSAGE INCLUDING: YOUR NAME DATE OF BIRTH CALL BACK NUMBER REASON FOR CALL**this is important as we prioritize the call backs  YOU WILL RECEIVE A CALL BACK THE SAME DAY AS LONG AS YOU CALL BEFORE 4:00 PM  At the [facility], you and your health needs are our priority. As part of our continuing mission to provide you with exceptional heart care, we have created designated Provider Care Teams. These Care Teams include your primary Cardiologist (physician) and Advanced Practice Providers (APPs- Physician Assistants and Nurse Practitioners) who all work together to provide you with the care you need, when you need it.   You may see any of the following providers on your designated Care Team at your next follow up: Dr Daniel Bensimhon Dr Dalton McLean Dr. Aditya Sabharwal Amy Clegg, NP Brittainy Simmons, PA Jessica Milford,NP Lindsay Finch, PA Alma Diaz, NP Lauren Kemp, PharmD   Please be sure to bring in all your medications bottles to every appointment.     

## 2022-04-10 NOTE — Progress Notes (Signed)
ID:  Christopher [name], DOB 1960/11/14, MRN [id number]   Provider location: [facility] Type of Visit: Established patient   PCP: Shirlean Mylar, MD  Cardiologist:  [name], MD   History of Present Illness:  Patient has a history of CAD s/p CABG, ischemic cardiomyopathy, atrial fibrillation, and CKD.  In 3/17, he came to the hospital with a late presentation inferior MI.  He was noted to have occluded RCA with collaterals and 95% proximal LAD.  He had CABG x 4 + right CEA.  He had a balloon pump with cardiogenic shock.  He additionally had multiple episodes of post-op VT/VF.  Amiodarone was started.  Repeat cath showed slow flow in the SVG-D and SVG-ramus given diffusely diseased and small distal targets.  He developed AKI.  He had prolonged mechanical ventilation and a tracheostomy.  He had RV failure that was treated with Revatio.  He had transient atrial flutter that resolved quickly.  Last echo in 3/17 showed EF 30-35%.  Finally, the tracheostomy was removed.  He went to inpatient rehab for about a week and is now home again. Echo 6/17 with EF 40-45%.   Admitted 8/21-8/27/18 with acute respiratory failure due to HCAP and HTN emergency. Also had rapid atrial flutter. Rate improved and DCCV was planned for outpatient as he had missed a few doses of Eliquis at home. Discharge weight was 270 pounds. He had DCCV to NSR in 9/18.  He was admitted again in 4/19 after syncopal episode and was found to be in atrial flutter again.  He had a LINQ monitor placed and was cardioverted back to NSR.   Atrial flutter ablation in 5/19.  He is now off Eliquis.   Echo in 7/21 showed EF 40-45%, inferior/inferoseptal hypokinesis, mildly decreased RV systolic function.  Echo in 11/22 showed EF 45%, mild LVH and mild LV enlargement, mild RV enlargement with mildly decreased systolic function.   He returns today for followup of CHF and CAD.   Weight up 3 lbs.  He is doing well, says he feels  "really good."  Plays soccer with his grand-daughter.  No significant exertional dyspnea.  No lightheadedness.  He has been going to the gym.  No chest pain.     ECG (personally reviewed): Sinus brady at 54, 1st degree AVB, old inferior MI, old anterolateral MI, LAFB.   Labs (4/17): K 3.4, creatinine 1.6 Labs (5/17): K 4.4, creatinine 1.65, LFTs normal, TSH mildly elevated, free T3 and free T4 normal, BNP 502, hemoglobin 12.3 Labs (6/17): BNP 737, K 4.4, creatinine 1.72 Labs (7/17): LDL 136, K 4.9, creatinine 1.79 Labs (8/17): BNP 239, K 3.8, creatinine 2.17 Labs (10/17): LDL 74, HDL 34, LFTs normal Labs (3/14): K 5, creatinine 1.67, BNP 774 Labs (6/18): K 3.3, creatinine 1.82 Labs 02/06/2017: K 4.3 Creatinine 1.78.  Labs (9/18): K 4.1, creatinine 1.73 Labs (1/19): LDL 48 Labs (4/19): K 4, creatinine 1.38, hgb 15.3 Labs (9/19): K 4.1, creatinine 1.49, LDL 30, TGs 269 Labs (11/19): K 3.8, creatinine 1.43 Labs (9/20): K 3.8, creatinine 1.43, LDL 50, HDL 31, TGs 324 Labs (12/20): K 4.1, creatinine 1.4, LDL 36, TGs 245 Labs (7/21): K 3.6, creatinine 1.32 Labs (9/21): LDL 49, HDL 33, TGs 225 Labs (4/22): LDL 43, TGs 224, K 3.7, creatinine 1.49 Labs (8/22): TGs 350, LDL 19, K 3.8, creatinine 9.70 Labs (11/22): K 3.6, creatinine 1.8, BNP 130 Labs (3/23): LDL 21, TGs 264, K 4, creatinine 1.85  PMH: 1. CAD: Late  presentation inferior MI in 3/17.  - LHC showed totally occluded pRCA with L=>R collaterals, 95% pLAD, 70% ramus.   - CABG with LIMA-LAD, SVG-D, SVG-ramus, SVG-RCA.  - Repeat echo in 3/17 after CABG showed patent LIMA and SVG-RCA but SVG-D and SVG- ramus had slow flow with small, diffusely diseased distal targets.  - Echo (6/17) with EF 40-45%, inferior severe hypokinesis, mildly dilated RV with normal systolic function, PASP 30 mmHg.  2. Chronic systolic CHF: Ischemic cardiomyopathy.   - Echo (3/17) with EF 30-35%, septal and inferior AK.   - Echo (7/18): EF 40-45%, moderate FBSH,  grade II diastolic dysfunction, RV mildly dilated with normal systolic function.  - Echo (4/19): EF 40-45%, mild LV dilation - Echo (7/21): EF 40-45%, inferior/inferoseptal hypokinesis, grade II diastolic dysfunction, mildly decreased RV systolic function, PASP 42 mmHg.  - Echo (11/22): EF 45%, mild LVH and mild LV enlargement, mild RV enlargement with mildly decreased systolic function. 3. RV failure: was on Revatio, now off.  4. CKD: Stage 3.  5. Atrial flutter: Transient, post-op.   - DCCV to NSR in 9/18. - DCCV to NSR in 4/19.   6. VT: Post-op CABG.  7. Carotid stenosis: Right CEA 3/17.  - Carotid dopplers (9/18): 1-39% LICA, s/p R CEA.  - Carotid dopplers (10/19): 1-39% LICA, s/p right CEA - Carotid dopplers (11/22): Mild disease.  8. PAD: Peripheral artery dopplers (7/17) with > 50% L CIA stenosis, occluded distal external iliac, occluded right SFA, occluded left SFA.  - ABIs (11/22): moderate disease with ABI 0.74 R, 0.64 L 9. Peripheral neuropathy 10. Syncope: 4/19, now has LINQ monitor.   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   Family History  Problem Relation Age of Onset   Heart disease Mother    Failure to thrive Father    Congestive Heart Failure Father        Deceased   Breast cancer Sister        Living   Review of systems complete and found to be negative unless listed in HPI.   Current Outpatient Medications  Medication Sig Dispense Refill   acetaminophen (TYLENOL) 500 MG tablet Take 1,000 mg by mouth every 6 (six) hours as needed for mild pain or headache.      aspirin 81 MG tablet Take 81 mg by mouth daily.     carvedilol (COREG) 12.5 MG tablet Take 1 tablet (12.5 mg total) by mouth 2 (two) times daily with a meal. 60 tablet 11   DULoxetine (CYMBALTA) 30 MG capsule Take 30 mg by mouth daily.     ezetimibe (ZETIA) 10 MG tablet Take 1 tablet by mouth once daily 90 tablet 0   FARXIGA 10 MG TABS tablet TAKE 1 TABLET BY  MOUTH ONCE DAILY BEFORE BREAKFAST 90 tablet 3   fenofibrate (TRICOR) 145 MG tablet Take 1 tablet by mouth once daily 90 tablet 3   hydrALAZINE (APRESOLINE) 50 MG tablet TAKE 1 & 1/2 (ONE & ONE-HALF) TABLETS BY MOUTH THREE TIMES DAILY 405 tablet 0   isosorbide mononitrate (IMDUR) 60 MG 24 hr tablet Take 1 tablet by mouth once daily 90 tablet 3   potassium chloride SA (KLOR-CON M) 20 MEQ tablet Take 1 tablet by mouth once daily 90 tablet 3   QUEtiapine (SEROQUEL) 50 MG tablet Take 25 mg by mouth at bedtime.   11   ranitidine (ZANTAC) 150 MG tablet Take 150 mg by mouth 2 (two) times daily.  rosuvastatin (CRESTOR) 40 MG tablet Take 1 tablet by mouth once daily 90 tablet 3   senna-docusate (SENOKOT-S) 8.6-50 MG tablet Take 1 tablet by mouth at bedtime. For constipation 30 tablet 0   spironolactone (ALDACTONE) 25 MG tablet Take 1 tablet (25 mg total) by mouth daily. 90 tablet 3   torsemide (DEMADEX) 20 MG tablet Take 1 tablet (20 mg total) by mouth daily. 180 tablet 3   VASCEPA 1 g capsule Take 2 capsules by mouth twice daily 360 capsule 0   No current facility-administered medications for this encounter.   Vitals:   04/08/22 1339  BP: 110/70  Pulse: (!) 56  SpO2: 96%  Weight: 115.3 kg (254 lb 3.2 oz)    Wt Readings from Last 3 Encounters:  04/08/22 115.3 kg (254 lb 3.2 oz)  10/27/21 114.2 kg (251 lb 12.8 oz)  06/28/21 115.3 kg (254 lb 3.2 oz)    Exam:   BP 110/70   Pulse (!) 56   Wt 115.3 kg (254 lb 3.2 oz)   SpO2 96%   BMI 34.48 kg/m  General: NAD Neck: No JVD, no thyromegaly or thyroid nodule.  Lungs: Clear to auscultation bilaterally with normal respiratory effort. CV: Nondisplaced PMI.  Heart regular S1/S2, no S3/S4, no murmur.  No peripheral edema.  No carotid bruit.  Normal pedal pulses.  Abdomen: Soft, nontender, no hepatosplenomegaly, no distention.  Skin: Intact without lesions or rashes.  Neurologic: Alert and oriented x 3.  Psych: Normal affect. Extremities: No  clubbing or cyanosis.  HEENT: Normal.   Assessment/Plan: 1. CAD: s/p CABG. No chest pain.    - Continue Crestor, Zetia and Vascepa. Lipids were better in 3/23.  - Continue ASA 81 daily.   2. Atrial flutter: Initially occurred post operatively and was transient. Noted to be in [place] at office visit in 7/18 and again at hospitalization in 8/18. He had DCCV in 9/18.  He had atrial flutter again in 4/19 and was cardioverted.  He is now s/p atrial flutter ablation and is in NSR.  He is now off Eliquis.   3. Chronic systolic CHF: Ischemic cardiomyopathy.  Echo in 3/17 with EF 30-35%, improved to 40-45% on echo in 6/17.  Echo 02/2017 EF 40-45%, echo 4/19 with EF 40-45%.  Echo in 7/21 showed stable EF 40-45%.  Echo in 11/22 showed EF 45% with mild RV dysfunction.  NYHA I-II.  He is not volume overloaded on exam.  - Continue Coreg 12.5 mg bid, HR too low to titrate up.   - Continue hydralazine to 75 mg tid and Imdur 60 mg daily.   - Continue torsemide 20 mg daily.  - Continue spironolactone 25 mg daily.  BMET today.  - EF above ICD range.  - He had AKI with ARB and with Entresto so not on either med.   - Continue dapagliflozin.   - Repeat echo at followup in 6 months.  5. VT: No further.  - Off amiodarone.  6. CKD stage III: BMET today.  7. Carotid stenosis: s/p R CEA in 2017.  Minimal disease by dopplers in 11/22.  8. Hyperlipidemia: Continue Crestor, fenofibrate, Zetia, and Vascepa.  Lipids improved in 3/23.   9. PAD: Claudication improved with walking program, not markedly symptomatic at this point.  No rest pain or pedal ulcerations.  ABIs in 11/22 with moderate disease.  - Continue to walk through calf cramping. 10. Obesity: Insurance does not cover semaglutide.   Followup in 6 months with echo.  Signed, Loralie Champagne, MD  04/10/2022   [facility] [address] [facility] and [place] [address] [phone] (office) [phone]  (fax)

## 2022-04-19 ENCOUNTER — Other Ambulatory Visit (HOSPITAL_COMMUNITY): Payer: Self-pay | Admitting: Cardiology

## 2022-05-17 ENCOUNTER — Other Ambulatory Visit (HOSPITAL_COMMUNITY): Payer: Self-pay | Admitting: *Deleted

## 2022-05-17 MED ORDER — TORSEMIDE 20 MG PO TABS: 20.0000 mg | ORAL_TABLET | Freq: Every day | ORAL | 3 refills | Status: DC

## 2022-05-23 ENCOUNTER — Other Ambulatory Visit (HOSPITAL_COMMUNITY): Payer: Self-pay | Admitting: Cardiology

## 2022-06-07 ENCOUNTER — Encounter (HOSPITAL_COMMUNITY): Payer: Self-pay | Admitting: Cardiology

## 2022-06-10 ENCOUNTER — Encounter (HOSPITAL_COMMUNITY): Payer: Self-pay

## 2022-06-10 ENCOUNTER — Telehealth (HOSPITAL_COMMUNITY): Payer: Self-pay

## 2022-06-10 NOTE — Telephone Encounter (Signed)
Wife's FMLA paper work faxed on 06/10/22. My chart message sent to patient to inform him

## 2022-06-22 ENCOUNTER — Other Ambulatory Visit (HOSPITAL_COMMUNITY): Payer: Self-pay | Admitting: *Deleted

## 2022-06-22 ENCOUNTER — Other Ambulatory Visit (HOSPITAL_COMMUNITY): Payer: Self-pay | Admitting: Cardiology

## 2022-06-22 DIAGNOSIS — I5022 Chronic systolic (congestive) heart failure: Secondary | ICD-10-CM

## 2022-07-18 ENCOUNTER — Other Ambulatory Visit (HOSPITAL_COMMUNITY): Payer: Self-pay | Admitting: Cardiology

## 2022-07-18 DIAGNOSIS — I5022 Chronic systolic (congestive) heart failure: Secondary | ICD-10-CM

## 2022-07-19 ENCOUNTER — Other Ambulatory Visit (HOSPITAL_COMMUNITY): Payer: Self-pay | Admitting: Cardiology

## 2022-08-09 ENCOUNTER — Other Ambulatory Visit (HOSPITAL_COMMUNITY): Payer: Self-pay | Admitting: Cardiology

## 2022-08-16 ENCOUNTER — Other Ambulatory Visit (HOSPITAL_COMMUNITY): Payer: Self-pay | Admitting: Internal Medicine

## 2022-08-16 DIAGNOSIS — I5022 Chronic systolic (congestive) heart failure: Secondary | ICD-10-CM

## 2022-08-23 ENCOUNTER — Other Ambulatory Visit (HOSPITAL_COMMUNITY): Payer: Self-pay | Admitting: Cardiology

## 2022-09-11 ENCOUNTER — Other Ambulatory Visit (HOSPITAL_COMMUNITY): Payer: Self-pay | Admitting: Internal Medicine

## 2022-09-11 DIAGNOSIS — I5022 Chronic systolic (congestive) heart failure: Secondary | ICD-10-CM

## 2022-09-15 ENCOUNTER — Other Ambulatory Visit (HOSPITAL_COMMUNITY): Payer: Self-pay | Admitting: Cardiology

## 2022-09-16 ENCOUNTER — Telehealth (HOSPITAL_COMMUNITY): Payer: Self-pay | Admitting: *Deleted

## 2022-10-14 ENCOUNTER — Other Ambulatory Visit (HOSPITAL_COMMUNITY): Payer: Self-pay | Admitting: Cardiology

## 2022-10-27 ENCOUNTER — Ambulatory Visit (HOSPITAL_COMMUNITY)
Admission: RE | Admit: 2022-10-27 | Discharge: 2022-10-27 | Disposition: A | Discharge status: Home / Self Care | Payer: Medicare PPO | Source: Ambulatory Visit | Attending: Family Medicine | Admitting: Family Medicine

## 2022-10-27 ENCOUNTER — Encounter (HOSPITAL_COMMUNITY): Payer: Self-pay | Admitting: Cardiology

## 2022-10-27 ENCOUNTER — Ambulatory Visit (HOSPITAL_BASED_OUTPATIENT_CLINIC_OR_DEPARTMENT_OTHER)
Admission: RE | Admit: 2022-10-27 | Discharge: 2022-10-27 | Disposition: A | Discharge status: Home / Self Care | Payer: Medicare PPO | Source: Ambulatory Visit | Attending: Cardiology | Admitting: Cardiology

## 2022-10-27 VITALS — BP 124/74 | HR 49 | Wt 261.0 lb

## 2022-10-27 DIAGNOSIS — J449 Chronic obstructive pulmonary disease, unspecified: Secondary | ICD-10-CM | POA: Insufficient documentation

## 2022-10-27 DIAGNOSIS — Z6835 Body mass index (BMI) 35.0-35.9, adult: Secondary | ICD-10-CM | POA: Insufficient documentation

## 2022-10-27 DIAGNOSIS — E669 Obesity, unspecified: Secondary | ICD-10-CM | POA: Diagnosis not present

## 2022-10-27 DIAGNOSIS — I5022 Chronic systolic (congestive) heart failure: Secondary | ICD-10-CM

## 2022-10-27 DIAGNOSIS — Z79899 Other long term (current) drug therapy: Secondary | ICD-10-CM | POA: Diagnosis not present

## 2022-10-27 DIAGNOSIS — I252 Old myocardial infarction: Secondary | ICD-10-CM | POA: Insufficient documentation

## 2022-10-27 DIAGNOSIS — I130 Hypertensive heart and chronic kidney disease with heart failure and stage 1 through stage 4 chronic kidney disease, or unspecified chronic kidney disease: Secondary | ICD-10-CM | POA: Insufficient documentation

## 2022-10-27 DIAGNOSIS — R55 Syncope and collapse: Secondary | ICD-10-CM | POA: Diagnosis not present

## 2022-10-27 DIAGNOSIS — I739 Peripheral vascular disease, unspecified: Secondary | ICD-10-CM | POA: Diagnosis not present

## 2022-10-27 DIAGNOSIS — Z7982 Long term (current) use of aspirin: Secondary | ICD-10-CM | POA: Diagnosis not present

## 2022-10-27 DIAGNOSIS — I2510 Atherosclerotic heart disease of native coronary artery without angina pectoris: Secondary | ICD-10-CM | POA: Insufficient documentation

## 2022-10-27 DIAGNOSIS — N179 Acute kidney failure, unspecified: Secondary | ICD-10-CM | POA: Insufficient documentation

## 2022-10-27 DIAGNOSIS — Z951 Presence of aortocoronary bypass graft: Secondary | ICD-10-CM | POA: Diagnosis not present

## 2022-10-27 DIAGNOSIS — Z8673 Personal history of transient ischemic attack (TIA), and cerebral infarction without residual deficits: Secondary | ICD-10-CM | POA: Insufficient documentation

## 2022-10-27 DIAGNOSIS — I4892 Unspecified atrial flutter: Secondary | ICD-10-CM | POA: Insufficient documentation

## 2022-10-27 DIAGNOSIS — E785 Hyperlipidemia, unspecified: Secondary | ICD-10-CM | POA: Insufficient documentation

## 2022-10-27 DIAGNOSIS — N183 Chronic kidney disease, stage 3 unspecified: Secondary | ICD-10-CM | POA: Insufficient documentation

## 2022-10-27 DIAGNOSIS — R000 Tachycardia, unspecified: Secondary | ICD-10-CM | POA: Diagnosis not present

## 2022-10-27 LAB — BASIC METABOLIC PANEL
Anion gap: 14 (ref 5–15)
BUN: 21 mg/dL — ABNORMAL HIGH (ref 6–20)
CO2: 26 mmol/L (ref 22–32)
Calcium: 9.3 mg/dL (ref 8.9–10.3)
Chloride: 98 mmol/L (ref 98–111)
Creatinine, Ser: 1.8 mg/dL — ABNORMAL HIGH (ref 0.61–1.24)
GFR, Estimated: 43 mL/min — ABNORMAL LOW (ref >60)
Glucose, Bld: 99 mg/dL (ref 70–99)
Potassium: 3.7 mmol/L (ref 3.5–5.1)
Sodium: 138 mmol/L (ref 135–145)

## 2022-10-27 LAB — LIPID PANEL
Cholesterol: 91 mg/dL (ref 0–200)
HDL: 27 mg/dL — ABNORMAL LOW (ref >40)
LDL Cholesterol: 29 mg/dL (ref 0–99)
Total CHOL/HDL Ratio: 3.4 RATIO
Triglycerides: 174 mg/dL — ABNORMAL HIGH (ref <150)
VLDL: 35 mg/dL (ref 0–40)

## 2022-10-27 LAB — ECHOCARDIOGRAM COMPLETE
AR max vel: 3.78 cm2
AV Area VTI: 3.48 cm2
AV Area mean vel: 3.62 cm2
AV Mean grad: 3 mmHg
AV Peak grad: 5 mmHg
Ao pk vel: 1.12 m/s
Area-P 1/2: 4.1 cm2
Est EF: 45
S' Lateral: 4.4 cm

## 2022-10-27 LAB — BRAIN NATRIURETIC PEPTIDE: B Natriuretic Peptide: 225.4 pg/mL — ABNORMAL HIGH (ref 0.0–100.0)

## 2022-10-27 MED ORDER — TORSEMIDE 20 MG PO TABS: 30.0000 mg | ORAL_TABLET | Freq: Every day | ORAL | 3 refills | Status: DC

## 2022-10-27 MED ORDER — POTASSIUM CHLORIDE CRYS ER 20 MEQ PO TBCR: 30.0000 meq | EXTENDED_RELEASE_TABLET | Freq: Every day | ORAL | 3 refills | Status: DC

## 2022-10-27 NOTE — Progress Notes (Signed)
ID:  JIHAN LAUBY, DOB 1960-09-21, MRN [id number]   Provider location: [facility] Type of Visit: Established patient   PCP: Maurice Small, MD  Cardiologist:  Loralie Champagne, MD   History of Present Illness:  Patient has a history of CAD s/p CABG, ischemic cardiomyopathy, atrial fibrillation, and CKD.  In 3/17, he came to the hospital with a late presentation inferior MI.  He was noted to have occluded RCA with collaterals and 95% proximal LAD.  He had CABG x 4 + right CEA.  He had a balloon pump with cardiogenic shock.  He additionally had multiple episodes of post-op VT/VF.  Amiodarone was started.  Repeat cath showed slow flow in the SVG-D and SVG-ramus given diffusely diseased and small distal targets.  He developed AKI.  He had prolonged mechanical ventilation and a tracheostomy.  He had RV failure that was treated with Revatio.  He had transient atrial flutter that resolved quickly.  Last echo in 3/17 showed EF 30-35%.  Finally, the tracheostomy was removed.  He went to inpatient rehab for about a week and is now home again. Echo 6/17 with EF 40-45%.   Admitted 8/21-8/27/18 with acute respiratory failure due to HCAP and HTN emergency. Also had rapid atrial flutter. Rate improved and DCCV was planned for outpatient as he had missed a few doses of Eliquis at home. Discharge weight was 270 pounds. He had DCCV to NSR in 9/18.  He was admitted again in 4/19 after syncopal episode and was found to be in atrial flutter again.  He had a LINQ monitor placed and was cardioverted back to NSR.   Atrial flutter ablation in 5/19.  He is now off Eliquis.   Echo in 7/21 showed EF 40-45%, inferior/inferoseptal hypokinesis, mildly decreased RV systolic function.  Echo in 11/22 showed EF 45%, mild LVH and mild LV enlargement, mild RV enlargement with mildly decreased systolic function. Echo was done today and reviewed, EF 45% with mild LVH, mild LV dilation, grade 2 diastolic dysfunction,  mildly decreased RV systolic function.   He returns today for followup of CHF and CAD.  Weight is up by about 7 lbs.  He has been exercising more and thinks he has gained muscle.  He uses an exercise bike and rides for 30 minutes/day.  He also uses a rowing machine.  No exertional dyspnea or chest pain.  He "feels great."  No lightheadedness. HR in 40s at rest at times but increases to 90s with exercise.     ECG (personally reviewed): NSR 47 bpm, 1st degree AVB 234 msec, LAFB  Labs (4/17): K 3.4, creatinine 1.6 Labs (5/17): K 4.4, creatinine 1.65, LFTs normal, TSH mildly elevated, free T3 and free T4 normal, BNP 502, hemoglobin 12.3 Labs (6/17): BNP 737, K 4.4, creatinine 1.72 Labs (7/17): LDL 136, K 4.9, creatinine 1.79 Labs (8/17): BNP 239, K 3.8, creatinine 2.17 Labs (10/17): LDL 74, HDL 34, LFTs normal Labs (1/18): K 5, creatinine 1.67, BNP 774 Labs (6/18): K 3.3, creatinine 1.82 Labs 02/06/2017: K 4.3 Creatinine 1.78.  Labs (9/18): K 4.1, creatinine 1.73 Labs (1/19): LDL 48 Labs (4/19): K 4, creatinine 1.38, hgb 15.3 Labs (9/19): K 4.1, creatinine 1.49, LDL 30, TGs 269 Labs (11/19): K 3.8, creatinine 1.43 Labs (9/20): K 3.8, creatinine 1.43, LDL 50, HDL 31, TGs 324 Labs (12/20): K 4.1, creatinine 1.4, LDL 36, TGs 245 Labs (7/21): K 3.6, creatinine 1.32 Labs (9/21): LDL 49, HDL 33, TGs 225 Labs (4/22): LDL  43, TGs 224, K 3.7, creatinine 1.49 Labs (8/22): TGs 350, LDL 19, K 3.8, creatinine 2.24 Labs (11/22): K 3.6, creatinine 1.8, BNP 130 Labs (3/23): LDL 21, TGs 264, K 4, creatinine 1.85 Labs (9/23): K 3.7, creatinine 1.87  PMH: 1. CAD: Late presentation inferior MI in 3/17.  - LHC showed totally occluded pRCA with L=>R collaterals, 95% pLAD, 70% ramus.   - CABG with LIMA-LAD, SVG-D, SVG-ramus, SVG-RCA.  - Repeat echo in 3/17 after CABG showed patent LIMA and SVG-RCA but SVG-D and SVG- ramus had slow flow with small, diffusely diseased distal targets.  - Echo (6/17) with EF  40-45%, inferior severe hypokinesis, mildly dilated RV with normal systolic function, PASP 30 mmHg.  2. Chronic systolic CHF: Ischemic cardiomyopathy.   - Echo (3/17) with EF 30-35%, septal and inferior AK.   - Echo (7/18): EF 40-45%, moderate FBSH, grade II diastolic dysfunction, RV mildly dilated with normal systolic function.  - Echo (4/19): EF 40-45%, mild LV dilation - Echo (7/21): EF 40-45%, inferior/inferoseptal hypokinesis, grade II diastolic dysfunction, mildly decreased RV systolic function, PASP 42 mmHg.  - Echo (11/22): EF 45%, mild LVH and mild LV enlargement, mild RV enlargement with mildly decreased systolic function. - Echo (3/24): EF 45% with mild LVH, mild LV dilation, grade 2 diastolic dysfunction, mildly decreased RV systolic function.  3. RV failure: was on Revatio, now off.  4. CKD: Stage 3.  5. Atrial flutter: Transient, post-op.   - DCCV to NSR in 9/18. - DCCV to NSR in 4/19.   6. VT: Post-op CABG.  7. Carotid stenosis: Right CEA 3/17.  - Carotid dopplers ([id number]): 123456 LICA, s/p R CEA.  - Carotid dopplers ([id number]): 123456 LICA, s/p right CEA - Carotid dopplers (11/22): Mild disease.  8. PAD: Peripheral artery dopplers (7/17) with > 50% L CIA stenosis, occluded distal external iliac, occluded right SFA, occluded left SFA.  - ABIs (11/22): moderate disease with ABI 0.74 R, 0.64 L 9. Peripheral neuropathy 10. Syncope: 4/19, now has LINQ monitor.   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   Family History  Problem Relation Age of Onset   Heart disease Mother    Failure to thrive Father    Congestive Heart Failure Father        Deceased   Breast cancer Sister        Living   Review of systems complete and found to be negative unless listed in HPI.   Current Outpatient Medications  Medication Sig Dispense Refill   acetaminophen (TYLENOL) 500 MG tablet Take 1,000 mg by mouth every 6 (six) hours as needed for mild pain or  headache.      aspirin 81 MG tablet Take 81 mg by mouth daily.     carvedilol (COREG) 12.5 MG tablet TAKE 1 TABLET BY MOUTH TWICE DAILY WITH A MEAL 60 tablet 6   DULoxetine (CYMBALTA) 30 MG capsule Take 30 mg by mouth daily.     ezetimibe (ZETIA) 10 MG tablet Take 1 tablet by mouth once daily 90 tablet 0   FARXIGA 10 MG TABS tablet TAKE 1 TABLET BY MOUTH ONCE DAILY BEFORE BREAKFAST 90 tablet 0   fenofibrate (TRICOR) 145 MG tablet Take 1 tablet by mouth once daily 90 tablet 3   hydrALAZINE (APRESOLINE) 50 MG tablet TAKE 1 & 1/2 (ONE & ONE-HALF) TABLETS BY MOUTH THREE TIMES DAILY 405 tablet 0   isosorbide mononitrate (IMDUR) 60 MG 24 hr tablet Take 1 tablet  by mouth once daily 90 tablet 3   QUEtiapine (SEROQUEL) 50 MG tablet Take 25 mg by mouth at bedtime.   11   ranitidine (ZANTAC) 150 MG tablet Take 150 mg by mouth 2 (two) times daily.     rosuvastatin (CRESTOR) 40 MG tablet Take 1 tablet by mouth once daily 90 tablet 3   senna-docusate (SENOKOT-S) 8.6-50 MG tablet Take 1 tablet by mouth at bedtime. For constipation 30 tablet 0   spironolactone (ALDACTONE) 25 MG tablet Take 1 tablet by mouth once daily 90 tablet 3   VASCEPA 1 g capsule Take 2 capsules by mouth twice daily 360 capsule 0   potassium chloride SA (KLOR-CON M) 20 MEQ tablet Take 1.5 tablets (30 mEq total) by mouth daily. 90 tablet 3   torsemide (DEMADEX) 20 MG tablet Take 1.5 tablets (30 mg total) by mouth daily. 90 tablet 3   No current facility-administered medications for this encounter.   Vitals:   10/27/22 1204  BP: 124/74  Pulse: (!) 49  SpO2: 96%  Weight: 118.4 kg (261 lb)    Wt Readings from Last 3 Encounters:  10/27/22 118.4 kg (261 lb)  04/08/22 115.3 kg (254 lb 3.2 oz)  10/27/21 114.2 kg (251 lb 12.8 oz)    Exam:   BP 124/74   Pulse (!) 49   Wt 118.4 kg (261 lb)   SpO2 96%   BMI 35.40 kg/m  General: NAD Neck: JVP 8 cm with HJR, no thyromegaly or thyroid nodule.  Lungs: Clear to auscultation bilaterally  with normal respiratory effort. CV: Nondisplaced PMI.  Heart regular S1/S2, no S3/S4, no murmur.  No peripheral edema.  No carotid bruit.  Difficult to palpate pedal pulses.  Abdomen: Soft, nontender, no hepatosplenomegaly, no distention.  Skin: Intact without lesions or rashes.  Neurologic: Alert and oriented x 3.  Psych: Normal affect. Extremities: No clubbing or cyanosis.  HEENT: Normal.   Assessment/Plan: 1. CAD: s/p CABG. No chest pain.    - Continue Crestor, Zetia and Vascepa. Check lipids today.  - Continue ASA 81 daily.   2. Atrial flutter: Initially occurred post operatively and was transient. Noted to be in [place] at office visit in 7/18 and again at hospitalization in 8/18. He had DCCV in 9/18.  He had atrial flutter again in 4/19 and was cardioverted.  He is now s/p atrial flutter ablation and is in NSR.  He is now off Eliquis.  NSR today.  3. Chronic systolic CHF: Ischemic cardiomyopathy.  Echo in 3/17 with EF 30-35%, improved to 40-45% on echo in 6/17.  Echo 02/2017 EF 40-45%, echo 4/19 with EF 40-45%.  Echo in 7/21 showed stable EF 40-45%.  Echo in 11/22 showed EF 45% with mild RV dysfunction.  Echo today showed EF 45% with mild LVH, mild LV dilation, grade 2 diastolic dysfunction, mildly decreased RV systolic function. NYHA I-II.  Weight is up and he is mildly volume overloaded on exam despite minimal symptoms.  - Continue Coreg 12.5 mg bid, HR too low to titrate up.   - Continue hydralazine to 75 mg tid and Imdur 60 mg daily.   - Increase torsemide to 30 mg daily and KCl to 30 mEq daily with BMET/BNP and BMET in 10 days.  - Continue spironolactone 25 mg daily.   - EF above ICD range.  - He had AKI with ARB and with Entresto so not on either med.   - Continue dapagliflozin.   5. VT: No further.  -  Off amiodarone.  6. CKD stage III: BMET today.  7. Carotid stenosis: s/p R CEA in 2017.  Minimal disease by dopplers in 11/22.  8. Hyperlipidemia: Continue Crestor, fenofibrate,  Zetia, and Vascepa.  Check lipids today.  9. PAD: Claudication improved with walking program, not markedly symptomatic at this point.  No rest pain or pedal ulcerations.  ABIs in 11/22 with moderate disease.  - Continue to walk through calf cramping. 10. Obesity: Insurance does not cover semaglutide.   Followup in 6 months   Signed, Loralie Champagne, MD  10/27/2022   [facility] [address] [facility] and [place] [address] [phone] (office) [phone] (fax)

## 2022-10-27 NOTE — Patient Instructions (Signed)
INCREASE Torsemide to 30 mg daily  INCREASE Potassium to 30 mEq daily  Labs done today, your results will be available in MyChart, we will contact you for abnormal readings.  Repeat blood work in 10 days   Your physician recommends that you schedule a follow-up appointment in: 6 months (September ) ** please call the office in July to arrange your follow up appointment. **  If you have any questions or concerns before your next appointment please send [place] a message through [facility] or call our office at [phone].    TO LEAVE A MESSAGE FOR THE NURSE SELECT OPTION 2, PLEASE LEAVE A MESSAGE INCLUDING: YOUR NAME DATE OF BIRTH CALL BACK NUMBER REASON FOR CALL**this is important as we prioritize the call backs  YOU WILL RECEIVE A CALL BACK THE SAME DAY AS LONG AS YOU CALL BEFORE 4:00 PM  At the [facility], you and your health needs are our priority. As part of our continuing mission to provide you with exceptional heart care, we have created designated Provider Care Teams. These Care Teams include your primary Cardiologist (physician) and Advanced Practice Providers (APPs- Physician Assistants and Nurse Practitioners) who all work together to provide you with the care you need, when you need it.   You may see any of the following providers on your designated Care Team at your next follow up: Dr Glori Bickers Dr Loralie Champagne Dr. Roxana Hires, NP Lyda Jester, [place] [facility] [place], [place] Forestine Na, NP Audry Riles, PharmD   Please be sure to bring in all your medications bottles to every appointment.    Thank you for choosing [facility]

## 2022-10-27 NOTE — Progress Notes (Signed)
Echocardiogram 2D Echocardiogram has been performed.  Christopher [name] 10/27/2022, 11:41 AM

## 2022-11-08 ENCOUNTER — Other Ambulatory Visit (HOSPITAL_COMMUNITY): Payer: Self-pay | Admitting: Cardiology

## 2022-11-08 DIAGNOSIS — I509 Heart failure, unspecified: Secondary | ICD-10-CM

## 2022-11-10 ENCOUNTER — Other Ambulatory Visit (HOSPITAL_COMMUNITY): Payer: Self-pay | Admitting: Cardiology

## 2022-11-10 DIAGNOSIS — I509 Heart failure, unspecified: Secondary | ICD-10-CM

## 2022-11-11 DIAGNOSIS — Z7189 Other specified counseling: Secondary | ICD-10-CM | POA: Diagnosis not present

## 2022-11-11 DIAGNOSIS — N1831 Chronic kidney disease, stage 3a: Secondary | ICD-10-CM | POA: Diagnosis not present

## 2022-11-11 DIAGNOSIS — G629 Polyneuropathy, unspecified: Secondary | ICD-10-CM | POA: Diagnosis not present

## 2022-11-11 DIAGNOSIS — E6609 Other obesity due to excess calories: Secondary | ICD-10-CM | POA: Diagnosis not present

## 2022-11-11 DIAGNOSIS — R739 Hyperglycemia, unspecified: Secondary | ICD-10-CM | POA: Diagnosis not present

## 2022-11-11 DIAGNOSIS — K219 Gastro-esophageal reflux disease without esophagitis: Secondary | ICD-10-CM | POA: Diagnosis not present

## 2022-11-11 DIAGNOSIS — Z23 Encounter for immunization: Secondary | ICD-10-CM | POA: Diagnosis not present

## 2022-11-11 DIAGNOSIS — F5101 Primary insomnia: Secondary | ICD-10-CM | POA: Diagnosis not present

## 2022-11-11 DIAGNOSIS — Z7182 Exercise counseling: Secondary | ICD-10-CM | POA: Diagnosis not present

## 2022-11-11 DIAGNOSIS — Z6837 Body mass index (BMI) 37.0-37.9, adult: Secondary | ICD-10-CM | POA: Diagnosis not present

## 2022-11-22 ENCOUNTER — Other Ambulatory Visit (HOSPITAL_COMMUNITY): Payer: Self-pay | Admitting: Cardiology

## 2022-11-22 DIAGNOSIS — Z6837 Body mass index (BMI) 37.0-37.9, adult: Secondary | ICD-10-CM | POA: Diagnosis not present

## 2022-11-22 DIAGNOSIS — Z713 Dietary counseling and surveillance: Secondary | ICD-10-CM | POA: Diagnosis not present

## 2022-11-22 DIAGNOSIS — Z7182 Exercise counseling: Secondary | ICD-10-CM | POA: Diagnosis not present

## 2022-11-22 DIAGNOSIS — E1165 Type 2 diabetes mellitus with hyperglycemia: Secondary | ICD-10-CM | POA: Diagnosis not present

## 2022-12-12 ENCOUNTER — Other Ambulatory Visit (HOSPITAL_COMMUNITY): Payer: Self-pay | Admitting: Cardiology

## 2022-12-13 ENCOUNTER — Other Ambulatory Visit (HOSPITAL_COMMUNITY): Payer: Self-pay | Admitting: Cardiology

## 2023-01-11 ENCOUNTER — Other Ambulatory Visit (HOSPITAL_COMMUNITY): Payer: Self-pay | Admitting: Cardiology

## 2023-02-04 ENCOUNTER — Other Ambulatory Visit (HOSPITAL_COMMUNITY): Payer: Self-pay | Admitting: Cardiology

## 2023-02-04 DIAGNOSIS — I509 Heart failure, unspecified: Secondary | ICD-10-CM

## 2023-02-06 ENCOUNTER — Other Ambulatory Visit (HOSPITAL_COMMUNITY): Payer: Self-pay

## 2023-02-06 DIAGNOSIS — I509 Heart failure, unspecified: Secondary | ICD-10-CM

## 2023-02-06 MED ORDER — ROSUVASTATIN CALCIUM 40 MG PO TABS: 40.0000 mg | ORAL_TABLET | Freq: Every day | ORAL | 1 refills | Status: DC

## 2023-02-06 MED ORDER — EZETIMIBE 10 MG PO TABS: 10.0000 mg | ORAL_TABLET | Freq: Every day | ORAL | 1 refills | Status: DC

## 2023-02-20 DIAGNOSIS — Z7182 Exercise counseling: Secondary | ICD-10-CM | POA: Diagnosis not present

## 2023-02-20 DIAGNOSIS — R7303 Prediabetes: Secondary | ICD-10-CM | POA: Diagnosis not present

## 2023-02-20 DIAGNOSIS — Z6835 Body mass index (BMI) 35.0-35.9, adult: Secondary | ICD-10-CM | POA: Diagnosis not present

## 2023-02-20 DIAGNOSIS — Z713 Dietary counseling and surveillance: Secondary | ICD-10-CM | POA: Diagnosis not present

## 2023-02-23 ENCOUNTER — Other Ambulatory Visit (HOSPITAL_COMMUNITY): Payer: Self-pay | Admitting: Cardiology

## 2023-04-08 ENCOUNTER — Other Ambulatory Visit (HOSPITAL_COMMUNITY): Payer: Self-pay | Admitting: Cardiology

## 2023-04-16 ENCOUNTER — Other Ambulatory Visit (HOSPITAL_COMMUNITY): Payer: Self-pay | Admitting: Internal Medicine

## 2023-04-16 DIAGNOSIS — I5022 Chronic systolic (congestive) heart failure: Secondary | ICD-10-CM

## 2023-05-04 ENCOUNTER — Ambulatory Visit (HOSPITAL_COMMUNITY)
Admission: RE | Admit: 2023-05-04 | Discharge: 2023-05-04 | Disposition: A | Discharge status: Home / Self Care | Payer: Medicare PPO | Source: Ambulatory Visit | Attending: Cardiology | Admitting: Cardiology

## 2023-05-04 ENCOUNTER — Encounter (HOSPITAL_COMMUNITY): Payer: Self-pay | Admitting: Cardiology

## 2023-05-04 ENCOUNTER — Other Ambulatory Visit (HOSPITAL_COMMUNITY): Payer: Self-pay | Admitting: Cardiology

## 2023-05-04 VITALS — BP 120/70 | HR 45 | Wt 241.4 lb

## 2023-05-04 DIAGNOSIS — I255 Ischemic cardiomyopathy: Secondary | ICD-10-CM | POA: Diagnosis not present

## 2023-05-04 DIAGNOSIS — N183 Chronic kidney disease, stage 3 unspecified: Secondary | ICD-10-CM | POA: Diagnosis not present

## 2023-05-04 DIAGNOSIS — G459 Transient cerebral ischemic attack, unspecified: Secondary | ICD-10-CM | POA: Diagnosis not present

## 2023-05-04 DIAGNOSIS — I739 Peripheral vascular disease, unspecified: Secondary | ICD-10-CM | POA: Diagnosis not present

## 2023-05-04 DIAGNOSIS — I5022 Chronic systolic (congestive) heart failure: Secondary | ICD-10-CM | POA: Diagnosis not present

## 2023-05-04 DIAGNOSIS — Z6832 Body mass index (BMI) 32.0-32.9, adult: Secondary | ICD-10-CM | POA: Diagnosis not present

## 2023-05-04 DIAGNOSIS — E669 Obesity, unspecified: Secondary | ICD-10-CM | POA: Diagnosis not present

## 2023-05-04 DIAGNOSIS — I2510 Atherosclerotic heart disease of native coronary artery without angina pectoris: Secondary | ICD-10-CM | POA: Insufficient documentation

## 2023-05-04 DIAGNOSIS — I472 Ventricular tachycardia, unspecified: Secondary | ICD-10-CM | POA: Insufficient documentation

## 2023-05-04 DIAGNOSIS — I4891 Unspecified atrial fibrillation: Secondary | ICD-10-CM | POA: Insufficient documentation

## 2023-05-04 DIAGNOSIS — I130 Hypertensive heart and chronic kidney disease with heart failure and stage 1 through stage 4 chronic kidney disease, or unspecified chronic kidney disease: Secondary | ICD-10-CM | POA: Diagnosis not present

## 2023-05-04 DIAGNOSIS — I6521 Occlusion and stenosis of right carotid artery: Secondary | ICD-10-CM | POA: Diagnosis present

## 2023-05-04 DIAGNOSIS — E785 Hyperlipidemia, unspecified: Secondary | ICD-10-CM | POA: Insufficient documentation

## 2023-05-04 MED ORDER — CLOPIDOGREL BISULFATE 75 MG PO TABS: 75.0000 mg | ORAL_TABLET | Freq: Every day | ORAL | 3 refills | Status: DC

## 2023-05-04 NOTE — Progress Notes (Signed)
ID:  Christopher [name], DOB 02-13-1961, MRN [id number]   Provider location: [place] [facility] Type of Visit: Established patient   PCP: Shirlean Mylar, MD  Cardiologist:  [name], MD   History of Present Illness:  Patient has a history of CAD s/p CABG, ischemic cardiomyopathy, atrial fibrillation, and CKD.  In 3/17, he came to the hospital with a late presentation inferior MI.  He was noted to have occluded RCA with collaterals and 95% proximal LAD.  He had CABG x 4 + right CEA.  He had a balloon pump with cardiogenic shock.  He additionally had multiple episodes of post-op VT/VF.  Amiodarone was started.  Repeat cath showed slow flow in the SVG-D and SVG-ramus given diffusely diseased and small distal targets.  He developed AKI.  He had prolonged mechanical ventilation and a tracheostomy.  He had RV failure that was treated with Revatio.  He had transient atrial flutter that resolved quickly.  Last echo in 3/17 showed EF 30-35%.  Finally, the tracheostomy was removed.  He went to inpatient rehab for about a week and is now home again. Echo 6/17 with EF 40-45%.   Admitted 8/21-8/27/18 with acute respiratory failure due to HCAP and HTN emergency. Also had rapid atrial flutter. Rate improved and DCCV was planned for outpatient as he had missed a few doses of Eliquis at home. Discharge weight was 270 pounds. He had DCCV to NSR in 9/18.  He was admitted again in 4/19 after syncopal episode and was found to be in atrial flutter again.  He had a LINQ monitor placed and was cardioverted back to NSR.   Atrial flutter ablation in 5/19.  He is now off Eliquis.   Echo in 7/21 showed EF 40-45%, inferior/inferoseptal hypokinesis, mildly decreased RV systolic function.  Echo in 11/22 showed EF 45%, mild LVH and mild LV enlargement, mild RV enlargement with mildly decreased systolic function. Echo in 3/24 showed EF 45% with mild LVH, mild LV dilation, grade 2 diastolic dysfunction, mildly  decreased RV systolic function.   Patient had an episode in 8/24 where he had left arm weakness for about 10 minutes.  EMS came out to his house, thought he had had a TIA, but given resolution, they did not take him to the ER.  He has not had any further neurologic symptoms/signs.   He returns today for followup of CHF and CAD.  Weight is down 20 lbs, he has been working on weight loss.  He has been playing 18 holes of golf at least once a week. No significant exertional dyspnea.  No chest pain.  No claudication.  No lightheadedness or palpitations.     ECG (personally reviewed): NSR at 45, 1st degree AVB, inferior/anterior Qs  Labs (4/17): K 3.4, creatinine 1.6 Labs (5/17): K 4.4, creatinine 1.65, LFTs normal, TSH mildly elevated, free T3 and free T4 normal, BNP 502, hemoglobin 12.3 Labs (6/17): BNP 737, K 4.4, creatinine 1.72 Labs (7/17): LDL 136, K 4.9, creatinine 1.79 Labs (8/17): BNP 239, K 3.8, creatinine 2.17 Labs (10/17): LDL 74, HDL 34, LFTs normal Labs (4/40): K 5, creatinine 1.67, BNP 774 Labs (6/18): K 3.3, creatinine 1.82 Labs 02/06/2017: K 4.3 Creatinine 1.78.  Labs (9/18): K 4.1, creatinine 1.73 Labs (1/19): LDL 48 Labs (4/19): K 4, creatinine 1.38, hgb 15.3 Labs (9/19): K 4.1, creatinine 1.49, LDL 30, TGs 269 Labs (11/19): K 3.8, creatinine 1.43 Labs (9/20): K 3.8, creatinine 1.43, LDL 50, HDL 31, TGs 324 Labs (  12/20): K 4.1, creatinine 1.4, LDL 36, TGs 245 Labs (7/21): K 3.6, creatinine 1.32 Labs (9/21): LDL 49, HDL 33, TGs 225 Labs (4/22): LDL 43, TGs 224, K 3.7, creatinine 1.49 Labs (8/22): TGs 350, LDL 19, K 3.8, creatinine 1.61 Labs (11/22): K 3.6, creatinine 1.8, BNP 130 Labs (3/23): LDL 21, TGs 264, K 4, creatinine 1.85 Labs (9/23): K 3.7, creatinine 1.87 Labs (7/24): K 4.6, creatinine 1.89, LDL 33, TGs 187  PMH: 1. CAD: Late presentation inferior MI in 3/17.  - LHC showed totally occluded pRCA with L=>R collaterals, 95% pLAD, 70% ramus.   - CABG with  LIMA-LAD, SVG-D, SVG-ramus, SVG-RCA.  - Repeat echo in 3/17 after CABG showed patent LIMA and SVG-RCA but SVG-D and SVG- ramus had slow flow with small, diffusely diseased distal targets.  - Echo (6/17) with EF 40-45%, inferior severe hypokinesis, mildly dilated RV with normal systolic function, PASP 30 mmHg.  2. Chronic systolic CHF: Ischemic cardiomyopathy.   - Echo (3/17) with EF 30-35%, septal and inferior AK.   - Echo (7/18): EF 40-45%, moderate FBSH, grade II diastolic dysfunction, RV mildly dilated with normal systolic function.  - Echo (4/19): EF 40-45%, mild LV dilation - Echo (7/21): EF 40-45%, inferior/inferoseptal hypokinesis, grade II diastolic dysfunction, mildly decreased RV systolic function, PASP 42 mmHg.  - Echo (11/22): EF 45%, mild LVH and mild LV enlargement, mild RV enlargement with mildly decreased systolic function. - Echo (3/24): EF 45% with mild LVH, mild LV dilation, grade 2 diastolic dysfunction, mildly decreased RV systolic function.  3. RV failure: was on Revatio, now off.  4. CKD: Stage 3.  5. Atrial flutter: Transient, post-op.   - DCCV to NSR in 9/18. - DCCV to NSR in 4/19.   - Atrial flutter ablation, anticoagulation stopped.  6. VT: Post-op CABG.  7. Carotid stenosis: Right CEA 3/17.  - Carotid dopplers (9/18): 1-39% LICA, s/p R CEA.  - Carotid dopplers (10/19): 1-39% LICA, s/p right CEA - Carotid dopplers (11/22): Mild disease.  8. PAD: Peripheral artery dopplers (7/17) with > 50% L CIA stenosis, occluded distal external iliac, occluded right SFA, occluded left SFA.  - ABIs (11/22): moderate disease with ABI 0.74 R, 0.64 L 9. Peripheral neuropathy 10. Syncope: 4/19, now has LINQ monitor.  11. TIA: 8/24, left arm weakness.   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   Family History  Problem Relation Age of Onset   Heart disease Mother    Failure to thrive Father    Congestive Heart Failure Father         Deceased   Breast cancer Sister        Living   Review of systems complete and found to be negative unless listed in HPI.   Current Outpatient Medications  Medication Sig Dispense Refill   acetaminophen (TYLENOL) 500 MG tablet Take 1,000 mg by mouth every 6 (six) hours as needed for mild pain or headache.      aspirin 81 MG tablet Take 81 mg by mouth daily.     carvedilol (COREG) 12.5 MG tablet TAKE 1 TABLET BY MOUTH TWICE DAILY WITH A MEAL 60 tablet 0   clopidogrel (PLAVIX) 75 MG tablet Take 1 tablet (75 mg total) by mouth daily. 30 tablet 3   dapagliflozin propanediol (FARXIGA) 10 MG TABS tablet TAKE 1 TABLET BY MOUTH ONCE DAILY BEFORE BREAKFAST 90 tablet 3   DULoxetine (CYMBALTA) 30 MG capsule Take 30 mg by mouth daily.  ezetimibe (ZETIA) 10 MG tablet Take 1 tablet (10 mg total) by mouth daily. 90 tablet 1   fenofibrate (TRICOR) 145 MG tablet Take 1 tablet by mouth once daily 90 tablet 3   hydrALAZINE (APRESOLINE) 50 MG tablet TAKE 1 & 1/2 (ONE & ONE-HALF) TABLETS BY MOUTH THREE TIMES DAILY 405 tablet 5   isosorbide mononitrate (IMDUR) 60 MG 24 hr tablet Take 1 tablet by mouth once daily 90 tablet 0   potassium chloride SA (KLOR-CON M) 20 MEQ tablet Take 1.5 tablets (30 mEq total) by mouth daily. 90 tablet 3   QUEtiapine (SEROQUEL) 50 MG tablet Take 25 mg by mouth at bedtime.   11   ranitidine (ZANTAC) 150 MG tablet Take 150 mg by mouth 2 (two) times daily.     rosuvastatin (CRESTOR) 40 MG tablet Take 1 tablet (40 mg total) by mouth daily. 90 tablet 1   senna-docusate (SENOKOT-S) 8.6-50 MG tablet Take 1 tablet by mouth at bedtime. For constipation 30 tablet 0   spironolactone (ALDACTONE) 25 MG tablet Take 1 tablet by mouth once daily 90 tablet 0   torsemide (DEMADEX) 20 MG tablet Take 1.5 tablets (30 mg total) by mouth daily. 90 tablet 3   VASCEPA 1 g capsule Take 2 capsules by mouth twice daily 360 capsule 1   No current facility-administered medications for this encounter.    Vitals:   05/04/23 0837  BP: 120/70  Pulse: (!) 45  SpO2: 98%  Weight: 109.5 kg (241 lb 6.4 oz)    Wt Readings from Last 3 Encounters:  05/04/23 109.5 kg (241 lb 6.4 oz)  10/27/22 118.4 kg (261 lb)  04/08/22 115.3 kg (254 lb 3.2 oz)    Exam:   BP 120/70   Pulse (!) 45   Wt 109.5 kg (241 lb 6.4 oz)   SpO2 98%   BMI 32.74 kg/m  General: NAD Neck: No JVD, no thyromegaly or thyroid nodule.  Lungs: Clear to auscultation bilaterally with normal respiratory effort. CV: Nondisplaced PMI.  Heart regular S1/S2, no S3/S4, no murmur.  No peripheral edema.  No carotid bruit.  Normal pedal pulses.  Abdomen: Soft, nontender, no hepatosplenomegaly, no distention.  Skin: Intact without lesions or rashes.  Neurologic: Alert and oriented x 3.  Psych: Normal affect. Extremities: No clubbing or cyanosis.  HEENT: Normal.   Assessment/Plan: 1. CAD: s/p CABG. No chest pain.    - Continue Crestor, Zetia and Vascepa. Good lipids in 7/24.   - Continue ASA 81 daily.   2. Atrial flutter: Initially occurred post operatively and was transient. Noted to be in Aflutter at office visit in 7/18 and again at hospitalization in 8/18. He had DCCV in 9/18.  He had atrial flutter again in 4/19 and was cardioverted.  He is now s/p atrial flutter ablation and is in NSR.  He is now off Eliquis.  NSR today.  - Give possible TIA, I will have him wear Zio monitor x 1 month to look for AF or AFL.  3. Chronic systolic CHF: Ischemic cardiomyopathy.  Echo in 3/17 with EF 30-35%, improved to 40-45% on echo in 6/17.  Echo 02/2017 EF 40-45%, echo 4/19 with EF 40-45%.  Echo in 7/21 showed stable EF 40-45%.  Echo in 11/22 showed EF 45% with mild RV dysfunction.  Echo in 3/24 showed EF 45% with mild LVH, mild LV dilation, grade 2 diastolic dysfunction, mildly decreased RV systolic function. NYHA I-II.  Weight is down and he is not volume overloaded on  exam.  - Continue Coreg 12.5 mg bid, HR too low to titrate up.   - Continue  hydralazine to 75 mg tid and Imdur 60 mg daily.   - Continue torsemide 30 mg daily, BMET/BNP today.   - Continue spironolactone 25 mg daily.   - Continue Farxiga 10 mg daily.  - EF above ICD range.  - He had AKI with ARB and with Entresto so not on either med.   5. VT: No further.  - Off amiodarone.  6. CKD stage III: BMET today.  7. Carotid stenosis: s/p R CEA in 2017.   - I will arrange for carotid dopplers.  8. Hyperlipidemia: Continue Crestor, fenofibrate, Zetia, and Vascepa.  Good lipids in 7/24.  9. PAD: Claudication improved with walking program, not markedly symptomatic at this point.  No rest pain or pedal ulcerations.  ABIs in 11/22 with moderate disease.  - I will arrange for repeat ABIs.  10. Obesity: BP coming down nicely with diet/exercise.  11. TIA: Suspect TIA with transient left arm weakness in 8/24.  No recurrence.  - Worrisome episode, I will add Plavix 75 mg daily to his regimen (ASA 81 daily).  - Zio monitor x 1 month for atrial arrhythmias (as above).  - Carotid dopplers - I asked him to speak with his PCP about get a local neurology appointment in [place].   Followup in 4 months   Signed, [name], MD  05/04/2023   [facility] [address] [facility] [place] [address] [phone] (office) [phone] (fax)

## 2023-05-04 NOTE — Patient Instructions (Addendum)
Medication Changes:  START: PLAVIX (CLOPIDOGREL) 75MG  ONCE DAILY   Lab Work:  None Ordered At This Time.   Testing/Procedures:  Your provider has recommended that  you wear a Zio Patch for 14 days.  This monitor will record your heart rhythm for our review.  IF you have any symptoms while wearing the monitor please press the button.  If you have any issues with the patch or you notice a red or orange light on it please call the company at [phone].  Once you remove the patch please mail it back to the company as soon as possible so we can get the results.   THEN WEAR ONE FOR AN ADDITIONAL 14 DAYS- THIS WILL BE MAILED TO YOU   Your physician has requested that you have an ankle brachial index (ABI). During this test an ultrasound and blood pressure cuff are used to evaluate the arteries that supply the arms and legs with blood. Allow thirty minutes for this exam. There are no restrictions or special instructions. This will take place at [address], [address]    Your physician has requested that you have a carotid duplex. This test is an ultrasound of the carotid arteries in your neck. It looks at blood flow through these arteries that supply the brain with blood. Allow one hour for this exam. There are no restrictions or special instructions.  Follow-Up in: 4 MONTHS PLEASE CALL OUR OFFICE AROUND NOVEMBER 2024 TO GET SCHEDULED FOR YOUR APPOINTMENT. PHONE NUMBER IS [phone] OPTION 2   PLEASE TALK TO PRIMARY CARE PHYSICIAN REGARDING A NEUROLOGY APPT CLOSER TO YOU    At the [facility], you and your health needs are our priority. We have a designated team specialized in the treatment of Heart Failure. This Care Team includes your primary Heart Failure Specialized Cardiologist (physician), Advanced Practice Providers (APPs- Physician Assistants and Nurse Practitioners), and Pharmacist who all work together to provide you with the care you need, when you need it.    You may see any of the following providers on your designated Care Team at your next follow up:  Dr. Arvilla Meres Dr. [name] Dr. Marcos Eke, NP Robbie Lis, [place] [facility] [place], [place] Brynda Peon, NP Karle Plumber, PharmD   Please be sure to bring in all your medications bottles to every appointment.   Need to Contact us:  If you have any questions or concerns before your next appointment please send [place] a message through [place] or call our office at [phone].    TO LEAVE A MESSAGE FOR THE NURSE SELECT OPTION 2, PLEASE LEAVE A MESSAGE INCLUDING: YOUR NAME DATE OF BIRTH CALL BACK NUMBER REASON FOR CALL**this is important as we prioritize the call backs  YOU WILL RECEIVE A CALL BACK THE SAME DAY AS LONG AS YOU CALL BEFORE 4:00 PM

## 2023-05-16 ENCOUNTER — Other Ambulatory Visit (HOSPITAL_COMMUNITY): Payer: Self-pay | Admitting: Internal Medicine

## 2023-05-16 DIAGNOSIS — I5022 Chronic systolic (congestive) heart failure: Secondary | ICD-10-CM

## 2023-05-18 ENCOUNTER — Encounter (HOSPITAL_COMMUNITY): Payer: Self-pay | Admitting: Cardiology

## 2023-05-24 ENCOUNTER — Other Ambulatory Visit (HOSPITAL_COMMUNITY): Payer: Self-pay | Admitting: Cardiology

## 2023-05-29 ENCOUNTER — Telehealth (HOSPITAL_COMMUNITY): Payer: Self-pay | Admitting: Cardiology

## 2023-05-29 DIAGNOSIS — G4733 Obstructive sleep apnea (adult) (pediatric): Secondary | ICD-10-CM

## 2023-05-29 DIAGNOSIS — I5022 Chronic systolic (congestive) heart failure: Secondary | ICD-10-CM

## 2023-05-29 DIAGNOSIS — G459 Transient cerebral ischemic attack, unspecified: Secondary | ICD-10-CM | POA: Diagnosis not present

## 2023-05-29 NOTE — Addendum Note (Signed)
Encounter addended by: Crissie Figures, RN on: 05/29/2023 3:22 PM  Actions taken: Imaging Exam ended

## 2023-05-29 NOTE — Telephone Encounter (Signed)
7 second pauses at night, may be related to sleep apnea.  However, these episodes are very long.  Would have him decrease Coreg to 6.25 mg bid for now and may have to stop.  Needs sleep study, can do at home study if insurance allows.  Needs appt with EP with pauses this long.

## 2023-05-29 NOTE — Telephone Encounter (Signed)
  [employer] called with alert Back to back pauses lasting 7.1 second pause On 05/10/23 at 0357  Event loaded on website Page 10 strip 6

## 2023-05-30 MED ORDER — CARVEDILOL 6.25 MG PO TABS: 6.2500 mg | ORAL_TABLET | Freq: Two times a day (BID) | ORAL | 3 refills | Status: DC

## 2023-05-30 NOTE — Telephone Encounter (Addendum)
Pt aware and voiced understanding  Meds updated Itamar ordered -pt will stop by for kit NO PRE CERT REQUIRED PER JASMINE B CMA Ep order placed (Camnitz)

## 2023-06-01 ENCOUNTER — Ambulatory Visit (HOSPITAL_COMMUNITY)
Admission: RE | Admit: 2023-06-01 | Discharge: 2023-06-01 | Disposition: A | Discharge status: Home / Self Care | Payer: Medicare PPO | Source: Ambulatory Visit | Attending: Cardiology | Admitting: Cardiology

## 2023-06-01 ENCOUNTER — Ambulatory Visit (HOSPITAL_BASED_OUTPATIENT_CLINIC_OR_DEPARTMENT_OTHER)
Admission: RE | Admit: 2023-06-01 | Discharge: 2023-06-01 | Disposition: A | Discharge status: Home / Self Care | Payer: Medicare PPO | Source: Ambulatory Visit | Attending: Cardiology | Admitting: Cardiology

## 2023-06-01 DIAGNOSIS — I739 Peripheral vascular disease, unspecified: Secondary | ICD-10-CM | POA: Insufficient documentation

## 2023-06-01 DIAGNOSIS — G459 Transient cerebral ischemic attack, unspecified: Secondary | ICD-10-CM

## 2023-06-01 LAB — VAS US ABI WITH/WO TBI
Left ABI: 0.6
Right ABI: 0.74

## 2023-06-15 ENCOUNTER — Telehealth (HOSPITAL_COMMUNITY): Payer: Self-pay

## 2023-06-15 NOTE — Telephone Encounter (Signed)
FMLA paper work faxed to [employer] on 06/15/23. My chart message sent to patient to inform him

## 2023-06-27 ENCOUNTER — Other Ambulatory Visit (HOSPITAL_COMMUNITY): Payer: Self-pay | Admitting: Cardiology

## 2023-07-07 ENCOUNTER — Other Ambulatory Visit (HOSPITAL_COMMUNITY): Payer: Self-pay | Admitting: Cardiology

## 2023-07-11 ENCOUNTER — Encounter: Payer: Self-pay | Admitting: Cardiology

## 2023-07-11 ENCOUNTER — Ambulatory Visit: Payer: Medicare PPO | Attending: Cardiology | Admitting: Cardiology

## 2023-07-11 ENCOUNTER — Other Ambulatory Visit (HOSPITAL_COMMUNITY): Payer: Self-pay | Admitting: Cardiology

## 2023-07-11 VITALS — BP 140/88 | HR 56 | Ht 72.0 in | Wt 236.0 lb

## 2023-07-11 DIAGNOSIS — I5022 Chronic systolic (congestive) heart failure: Secondary | ICD-10-CM | POA: Diagnosis not present

## 2023-07-11 DIAGNOSIS — I2510 Atherosclerotic heart disease of native coronary artery without angina pectoris: Secondary | ICD-10-CM

## 2023-07-11 DIAGNOSIS — I442 Atrioventricular block, complete: Secondary | ICD-10-CM | POA: Diagnosis not present

## 2023-07-11 DIAGNOSIS — I483 Typical atrial flutter: Secondary | ICD-10-CM

## 2023-07-11 DIAGNOSIS — I472 Ventricular tachycardia, unspecified: Secondary | ICD-10-CM

## 2023-07-11 NOTE — Progress Notes (Signed)
Electrophysiology Office Note:   Date:  07/11/2023  ID:  Christopher [name], DOB [birth year], MRN [id number]  Primary Cardiologist: Christopher Ancona, MD Electrophysiologist: Christopher Lemming, MD      History of Present Illness:   Christopher [name] is a 62 y.o. male with h/o coronary artery disease post CABG, chronic systolic heart failure, atrial fibrillation/flutter, CKD seen today for  for Electrophysiology evaluation of a block at the request of Christopher [name].    Today he feels well.  He has no chest pain or shortness of breath.  He is able to do all of his daily activities restriction.  He started playing golf.  He wore his cardiac monitor after he had a TIA.  No atrial arrhythmias were noted, but he was found to have significant bradycardia.  He has not had syncope, presyncope, dizziness, lightheadedness.  He is felt well.  He has been more active.  His carvedilol dose was decreased and he has noted an increase in his heart rates from the 40s to the mid to upper 50s.      In 3/17, he came to the hospital with a late presentation inferior MI.  He was noted to have occluded RCA with collaterals and 95% proximal LAD.  He had CABG x 4 + right CEA.  He had a balloon pump with cardiogenic shock.  He additionally had multiple episodes of post-op VT/VF.  Amiodarone was started.  Repeat cath showed slow flow in the SVG-D and SVG-ramus given diffusely diseased and small distal targets.  He developed AKI.  He had prolonged mechanical ventilation and a tracheostomy.  He had RV failure that was treated with Revatio.  He had transient atrial flutter that resolved quickly.  Last echo in 3/17 showed EF 30-35%.  Finally, the tracheostomy was removed.  He went to inpatient rehab for about a week and is now home again. Echo 6/17 with EF 40-45%.    Admitted 8/21-8/27/18 with acute respiratory failure due to HCAP and HTN emergency. Also had rapid atrial flutter. Rate improved and DCCV was planned for outpatient as  he had missed a few doses of Eliquis at home. Discharge weight was 270 pounds. He had DCCV to NSR in 9/18.   He was admitted again in 4/19 after syncopal episode and was found to be in atrial flutter again.  He had a LINQ monitor placed and was cardioverted back to NSR.    Atrial flutter ablation in 5/19.  He is now off Eliquis.    Echo in 7/21 showed EF 40-45%, inferior/inferoseptal hypokinesis, mildly decreased RV systolic function.  Echo in 11/22 showed EF 45%, mild LVH and mild LV enlargement, mild RV enlargement with mildly decreased systolic function. Echo in 3/24 showed EF 45% with mild LVH, mild LV dilation, grade 2 diastolic dysfunction, mildly decreased RV systolic function.         Review of systems complete and found to be negative unless listed in HPI.   EP Information / Studies Reviewed:    EKG is ordered today. Personal review as below.        Risk Assessment/Calculations:             Physical Exam:   VS:  BP (!) 140/88 (BP Location: Left Arm, Patient Position: Sitting, Cuff Size: Large)   Pulse (!) 56   Ht 6' (1.829 m)   Wt 236 lb (107 kg)   SpO2 96%   BMI 32.01 kg/m    Wt Readings from Last 3  Encounters:  07/11/23 236 lb (107 kg)  05/04/23 241 lb 6.4 oz (109.5 kg)  10/27/22 261 lb (118.4 kg)     GEN: Well nourished, well developed in no acute distress NECK: No JVD; No carotid bruits CARDIAC: Regular rate and rhythm, no murmurs, rubs, gallops RESPIRATORY:  Clear to auscultation without rales, wheezing or rhonchi  ABDOMEN: Soft, non-tender, non-distended EXTREMITIES:  No edema; No deformity   ASSESSMENT AND PLAN:    1.  Coronary artery disease: Post CABG.  No current chest pain.  Plan per primary cardiology.  2.  Typical atrial flutter: Post ablation.  No recurrence.  3.  Chronic systolic heart failure: Ejection fraction 45%.  Currently on optimal medical therapy per primary cardiology.  4.  Ventricular tachycardia: Off amiodarone.  No further  arrhythmias since recovery from initial episode.  5.  Intermittent heart block: He has had nocturnal episodes, but also daytime episodes.  He does sleep late at times which could be related to his episodes of heart block.  His carvedilol dose was decreased which significantly improved his baseline heart rates.  He has not had syncope or near syncope.  For now, we Christopher [name] continue to monitor.  Case discussed with primary cardiology  Follow up with Dr. Elberta Fortis  PRN   Signed, Christopher [name] Jorja Loa, MD

## 2023-07-11 NOTE — Patient Instructions (Signed)
Medication Instructions:  Your physician recommends that you continue on your current medications as directed. Please refer to the Current Medication list given to you today.  *If you need a refill on your cardiac medications before your next appointment, please call your pharmacy*   Lab Work: None ordered   Testing/Procedures: None ordered   Follow-Up: At [facility], you and your health needs are our priority.  As part of our continuing mission to provide you with exceptional heart care, we have created designated Provider Care Teams.  These Care Teams include your primary Cardiologist (physician) and Advanced Practice Providers (APPs -  Physician Assistants and Nurse Practitioners) who all work together to provide you with the care you need, when you need it.  Your next appointment:   as  needed  The format for your next appointment:   In Person  Provider:   Will Camnitz, MD    Thank you for choosing [facility]!!   Tionne Dayhoff, RN [phone]        

## 2023-07-12 ENCOUNTER — Other Ambulatory Visit (HOSPITAL_COMMUNITY): Payer: Self-pay | Admitting: Cardiology

## 2023-07-18 ENCOUNTER — Encounter (HOSPITAL_COMMUNITY): Payer: Self-pay | Admitting: Cardiology

## 2023-08-21 DIAGNOSIS — E6609 Other obesity due to excess calories: Secondary | ICD-10-CM | POA: Diagnosis not present

## 2023-08-21 DIAGNOSIS — E66811 Obesity, class 1: Secondary | ICD-10-CM | POA: Diagnosis not present

## 2023-08-21 DIAGNOSIS — Z7182 Exercise counseling: Secondary | ICD-10-CM | POA: Diagnosis not present

## 2023-08-21 DIAGNOSIS — Z713 Dietary counseling and surveillance: Secondary | ICD-10-CM | POA: Diagnosis not present

## 2023-08-21 DIAGNOSIS — Z7189 Other specified counseling: Secondary | ICD-10-CM | POA: Diagnosis not present

## 2023-08-21 DIAGNOSIS — F5101 Primary insomnia: Secondary | ICD-10-CM | POA: Diagnosis not present

## 2023-08-21 DIAGNOSIS — Z6833 Body mass index (BMI) 33.0-33.9, adult: Secondary | ICD-10-CM | POA: Diagnosis not present

## 2023-08-29 ENCOUNTER — Other Ambulatory Visit (HOSPITAL_COMMUNITY): Payer: Self-pay | Admitting: Cardiology

## 2023-09-07 ENCOUNTER — Ambulatory Visit (HOSPITAL_COMMUNITY)
Admission: RE | Admit: 2023-09-07 | Discharge: 2023-09-07 | Disposition: A | Discharge status: Home / Self Care | Payer: Medicare PPO | Source: Ambulatory Visit | Attending: Cardiology | Admitting: Cardiology

## 2023-09-07 ENCOUNTER — Encounter (HOSPITAL_COMMUNITY): Payer: Self-pay | Admitting: Cardiology

## 2023-09-07 VITALS — BP 130/70 | HR 51 | Wt 229.0 lb

## 2023-09-07 DIAGNOSIS — Z8673 Personal history of transient ischemic attack (TIA), and cerebral infarction without residual deficits: Secondary | ICD-10-CM | POA: Insufficient documentation

## 2023-09-07 DIAGNOSIS — I509 Heart failure, unspecified: Secondary | ICD-10-CM | POA: Diagnosis present

## 2023-09-07 DIAGNOSIS — I255 Ischemic cardiomyopathy: Secondary | ICD-10-CM | POA: Diagnosis not present

## 2023-09-07 DIAGNOSIS — E785 Hyperlipidemia, unspecified: Secondary | ICD-10-CM | POA: Insufficient documentation

## 2023-09-07 DIAGNOSIS — I5022 Chronic systolic (congestive) heart failure: Secondary | ICD-10-CM | POA: Insufficient documentation

## 2023-09-07 DIAGNOSIS — I2510 Atherosclerotic heart disease of native coronary artery without angina pectoris: Secondary | ICD-10-CM | POA: Diagnosis not present

## 2023-09-07 DIAGNOSIS — Z7902 Long term (current) use of antithrombotics/antiplatelets: Secondary | ICD-10-CM | POA: Diagnosis not present

## 2023-09-07 DIAGNOSIS — N183 Chronic kidney disease, stage 3 unspecified: Secondary | ICD-10-CM | POA: Diagnosis not present

## 2023-09-07 DIAGNOSIS — Z951 Presence of aortocoronary bypass graft: Secondary | ICD-10-CM | POA: Insufficient documentation

## 2023-09-07 DIAGNOSIS — E669 Obesity, unspecified: Secondary | ICD-10-CM | POA: Diagnosis not present

## 2023-09-07 DIAGNOSIS — I252 Old myocardial infarction: Secondary | ICD-10-CM | POA: Insufficient documentation

## 2023-09-07 DIAGNOSIS — I130 Hypertensive heart and chronic kidney disease with heart failure and stage 1 through stage 4 chronic kidney disease, or unspecified chronic kidney disease: Secondary | ICD-10-CM | POA: Diagnosis not present

## 2023-09-07 DIAGNOSIS — I4891 Unspecified atrial fibrillation: Secondary | ICD-10-CM | POA: Insufficient documentation

## 2023-09-07 DIAGNOSIS — R001 Bradycardia, unspecified: Secondary | ICD-10-CM | POA: Diagnosis not present

## 2023-09-07 DIAGNOSIS — R9431 Abnormal electrocardiogram [ECG] [EKG]: Secondary | ICD-10-CM | POA: Diagnosis not present

## 2023-09-07 LAB — LIPID PANEL
Cholesterol: 99 mg/dL (ref 0–200)
HDL: 32 mg/dL — ABNORMAL LOW (ref >40)
LDL Cholesterol: 41 mg/dL (ref 0–99)
Total CHOL/HDL Ratio: 3.1 {ratio}
Triglycerides: 128 mg/dL (ref <150)
VLDL: 26 mg/dL (ref 0–40)

## 2023-09-07 LAB — BASIC METABOLIC PANEL
Anion gap: 11 (ref 5–15)
BUN: 27 mg/dL — ABNORMAL HIGH (ref 8–23)
CO2: 27 mmol/L (ref 22–32)
Calcium: 9.2 mg/dL (ref 8.9–10.3)
Chloride: 100 mmol/L (ref 98–111)
Creatinine, Ser: 1.78 mg/dL — ABNORMAL HIGH (ref 0.61–1.24)
GFR, Estimated: 43 mL/min — ABNORMAL LOW (ref >60)
Glucose, Bld: 107 mg/dL — ABNORMAL HIGH (ref 70–99)
Potassium: 3.9 mmol/L (ref 3.5–5.1)
Sodium: 138 mmol/L (ref 135–145)

## 2023-09-07 LAB — BRAIN NATRIURETIC PEPTIDE: B Natriuretic Peptide: 344.7 pg/mL — ABNORMAL HIGH (ref 0.0–100.0)

## 2023-09-07 NOTE — Progress Notes (Signed)
Height:     Weight: BMI:  Today's Date:  STOP BANG RISK ASSESSMENT S (snore) Have you been told that you snore?     YES   T (tired) Are you often tired, fatigued, or sleepy during the day?   YES  O (obstruction) Do you stop breathing, choke, or gasp during sleep? NO   P (pressure) Do you have or are you being treated for high blood pressure? YES   B (BMI) Is your body index greater than 35 kg/m? NO   A (age) Are you 62 years old or older? YES   N (neck) Do you have a neck circumference greater than 16 inches?   NO   G (gender) Are you a male? YES   TOTAL STOP/BANG "YES" ANSWERS 5                                                                       For Office Use Only              Procedure Order Form    YES to 3+ Stop Bang questions OR two clinical symptoms - patient qualifies for WatchPAT (CPT 95800)      Clinical Notes: Will consult Sleep Specialist and refer for management of therapy due to patient increased risk of Sleep Apnea. Ordering a sleep study due to the following two clinical symptoms: Excessive daytime sleepiness G47.10 / Difficulty concentrating R41.840 / Memory problems or poor judgment G31.84 / Personality changes or irritability R45.4 / Loud snoring R06.83 / Unrefreshed by sleep G47.8 / History of high blood pressure R03.0 / Insomnia G47.00

## 2023-09-07 NOTE — Patient Instructions (Signed)
There has been no changes to your medications.  Labs done today, your results will be available in MyChart, we will contact you for abnormal readings.  Your physician has requested that you have an echocardiogram. Echocardiography is a painless test that uses sound waves to create images of your heart. It provides your doctor with information about the size and shape of your heart and how well your heart's chambers and valves are working. This procedure takes approximately one hour. There are no restrictions for this procedure. Please do NOT wear cologne, perfume, aftershave, or lotions (deodorant is allowed). Please arrive 15 minutes prior to your appointment time.  Please note: We ask at that you not bring children with you during ultrasound (echo/ vascular) testing. Due to room size and safety concerns, children are not allowed in the ultrasound rooms during exams. Our front office staff cannot provide observation of children in our lobby area while testing is being conducted. An adult accompanying a patient to their appointment will only be allowed in the ultrasound room at the discretion of the ultrasound technician under special circumstances. We apologize for any inconvenience.  Your provider has recommended that you have a home sleep study ([facility] Test).  We have provided you with the equipment in our office today. Please go ahead and download the app. DO NOT OPEN OR TAMPER WITH THE BOX UNTIL WE ADVISE YOU TO DO SO. Once insurance has approved the test our office will call you with PIN number and approval to proceed with testing. Once you have completed the test you just dispose of the equipment, the information is automatically uploaded to [place] via blue-tooth technology. If your test is positive for sleep apnea and you need a home CPAP machine you will be contacted by Dr Norris Cross office [place]) to set this up.   Your physician recommends that you schedule a follow-up appointment in: 4  months.  If you have any questions or concerns before your next appointment please send [place] a message through [place] or call our office at [phone].    TO LEAVE A MESSAGE FOR THE NURSE SELECT OPTION 2, PLEASE LEAVE A MESSAGE INCLUDING: YOUR NAME DATE OF BIRTH CALL BACK NUMBER REASON FOR CALL**this is important as we prioritize the call backs  YOU WILL RECEIVE A CALL BACK THE SAME DAY AS LONG AS YOU CALL BEFORE 4:00 PM  At the [facility], you and your health needs are our priority. As part of our continuing mission to provide you with exceptional heart care, we have created designated Provider Care Teams. These Care Teams include your primary Cardiologist (physician) and Advanced Practice Providers (APPs- Physician Assistants and Nurse Practitioners) who all work together to provide you with the care you need, when you need it.   You may see any of the following providers on your designated Care Team at your next follow up: Dr Arvilla Meres Dr [name] Dr. Dorthula Nettles Dr. Clearnce Hasten Amy Filbert Schilder, NP Robbie Lis, [place] [facility] [place], [place] Brynda Peon, NP [place] Lee, NP Karle Plumber, PharmD   Please be sure to bring in all your medications bottles to every appointment.    Thank you for choosing [facility]

## 2023-09-08 NOTE — Progress Notes (Signed)
ID:  Christopher [name], DOB 1961-02-12, MRN [id number]   Provider location: [place] [facility] Type of Visit: Established patient   PCP: Shirlean Mylar, MD  Cardiologist:  [name], MD  Chief complaint: CAD, CHF   History of Present Illness:  Patient has a history of CAD s/p CABG, ischemic cardiomyopathy, atrial fibrillation, and CKD.  In 3/17, he came to the hospital with a late presentation inferior MI.  He was noted to have occluded RCA with collaterals and 95% proximal LAD.  He had CABG x 4 + right CEA.  He had a balloon pump with cardiogenic shock.  He additionally had multiple episodes of post-op VT/VF.  Amiodarone was started.  Repeat cath showed slow flow in the SVG-D and SVG-ramus given diffusely diseased and small distal targets.  He developed AKI.  He had prolonged mechanical ventilation and a tracheostomy.  He had RV failure that was treated with Revatio.  He had transient atrial flutter that resolved quickly.  Last echo in 3/17 showed EF 30-35%.  Finally, the tracheostomy was removed.  He went to inpatient rehab for about a week and is now home again. Echo 6/17 with EF 40-45%.   Admitted 8/21-8/27/18 with acute respiratory failure due to HCAP and HTN emergency. Also had rapid atrial flutter. Rate improved and DCCV was planned for outpatient as he had missed a few doses of Eliquis at home. Discharge weight was 270 pounds. He had DCCV to NSR in 9/18.  He was admitted again in 4/19 after syncopal episode and was found to be in atrial flutter again.  He had a LINQ monitor placed and was cardioverted back to NSR.   Atrial flutter ablation in 5/19.  He is now off Eliquis.   Echo in 7/21 showed EF 40-45%, inferior/inferoseptal hypokinesis, mildly decreased RV systolic function.  Echo in 11/22 showed EF 45%, mild LVH and mild LV enlargement, mild RV enlargement with mildly decreased systolic function. Echo in 3/24 showed EF 45% with mild LVH, mild LV dilation, grade 2  diastolic dysfunction, mildly decreased RV systolic function.   Patient had an episode in 8/24 where he had left arm weakness for about 10 minutes.  EMS came out to his house, thought he had had a TIA, but given resolution, they did not take him to the ER.  He has not had any further neurologic symptoms/signs.   Zio monitor (10/24) showed no atrial fibrillation but did show 242 pauses, longest was 5.6 seconds.  I had him decrease Coreg.  He saw EP, and given improved HR with lower Coreg, they elected not to place pacemaker.   He returns today for followup of CHF and CAD.  Weight is down 15 more lbs, he has been working on weight loss.  Plays golf twice a week generally.  No exertional dyspnea or chest pain.  HR has been higher since decreasing Coreg, not getting low HR alerts from his Apple Watch now.  No lightheadedness or syncope.  He reports no claudication despite significant PAD.    ECG (personally reviewed): NSR at 48, 1st degree AVB, inferior/anterior Qs  Labs (4/17): K 3.4, creatinine 1.6 Labs (5/17): K 4.4, creatinine 1.65, LFTs normal, TSH mildly elevated, free T3 and free T4 normal, BNP 502, hemoglobin 12.3 Labs (6/17): BNP 737, K 4.4, creatinine 1.72 Labs (7/17): LDL 136, K 4.9, creatinine 1.79 Labs (8/17): BNP 239, K 3.8, creatinine 2.17 Labs (10/17): LDL 74, HDL 34, LFTs normal Labs (7/82): K 5, creatinine 1.67, BNP  774 Labs (6/18): K 3.3, creatinine 1.82 Labs 02/06/2017: K 4.3 Creatinine 1.78.  Labs (9/18): K 4.1, creatinine 1.73 Labs (1/19): LDL 48 Labs (4/19): K 4, creatinine 1.38, hgb 15.3 Labs (9/19): K 4.1, creatinine 1.49, LDL 30, TGs 269 Labs (11/19): K 3.8, creatinine 1.43 Labs (9/20): K 3.8, creatinine 1.43, LDL 50, HDL 31, TGs 324 Labs (12/20): K 4.1, creatinine 1.4, LDL 36, TGs 245 Labs (7/21): K 3.6, creatinine 1.32 Labs (9/21): LDL 49, HDL 33, TGs 225 Labs (4/22): LDL 43, TGs 224, K 3.7, creatinine 1.49 Labs (8/22): TGs 350, LDL 19, K 3.8, creatinine 2.95 Labs  (11/22): K 3.6, creatinine 1.8, BNP 130 Labs (3/23): LDL 21, TGs 264, K 4, creatinine 1.85 Labs (9/23): K 3.7, creatinine 1.87 Labs (7/24): K 4.6, creatinine 1.89, LDL 33, TGs 187  PMH: 1. CAD: Late presentation inferior MI in 3/17.  - LHC showed totally occluded pRCA with L=>R collaterals, 95% pLAD, 70% ramus.   - CABG with LIMA-LAD, SVG-D, SVG-ramus, SVG-RCA.  - Repeat echo in 3/17 after CABG showed patent LIMA and SVG-RCA but SVG-D and SVG- ramus had slow flow with small, diffusely diseased distal targets.  - Echo (6/17) with EF 40-45%, inferior severe hypokinesis, mildly dilated RV with normal systolic function, PASP 30 mmHg.  2. Chronic systolic CHF: Ischemic cardiomyopathy.   - Echo (3/17) with EF 30-35%, septal and inferior AK.   - Echo (7/18): EF 40-45%, moderate FBSH, grade II diastolic dysfunction, RV mildly dilated with normal systolic function.  - Echo (4/19): EF 40-45%, mild LV dilation - Echo (7/21): EF 40-45%, inferior/inferoseptal hypokinesis, grade II diastolic dysfunction, mildly decreased RV systolic function, PASP 42 mmHg.  - Echo (11/22): EF 45%, mild LVH and mild LV enlargement, mild RV enlargement with mildly decreased systolic function. - Echo (3/24): EF 45% with mild LVH, mild LV dilation, grade 2 diastolic dysfunction, mildly decreased RV systolic function.  3. RV failure: was on Revatio, now off.  4. CKD: Stage 3.  5. Atrial flutter: Transient, post-op.   - DCCV to NSR in 9/18. - DCCV to NSR in 4/19.   - Atrial flutter ablation, anticoagulation stopped.  6. VT: Post-op CABG.  7. Carotid stenosis: Right CEA 3/17.  - Carotid dopplers (9/18): 1-39% LICA, s/p R CEA.  - Carotid dopplers (10/19): 1-39% LICA, s/p right CEA - Carotid dopplers (11/22): Mild disease.  - Carotid dopplers (10/24): Minimal disease, s/p right CEA.  8. PAD: Peripheral artery dopplers (7/17) with > 50% L CIA stenosis, occluded distal external iliac, occluded right SFA, occluded left SFA.  -  ABIs (11/22): moderate disease with ABI 0.74 R, 0.64 L - ABIs (10/24): 0.74 R, 0.6 L.  9. Peripheral neuropathy 10. Syncope: 4/19, had LINQ monitor.  11. TIA: 8/24, left arm weakness.  12. Bradycardia: Zio monitor (10/24) showed 242 pauses, longest was 5.6 seconds.  Coreg decreased.   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   Family History  Problem Relation Age of Onset   Heart disease Mother    Failure to thrive Father    Congestive Heart Failure Father        Deceased   Breast cancer Sister        Living   Review of systems complete and found to be negative unless listed in HPI.   Current Outpatient Medications  Medication Sig Dispense Refill   acetaminophen (TYLENOL) 500 MG tablet Take 1,000 mg by mouth every 6 (six) hours as needed for mild  pain or headache.      aspirin 81 MG tablet Take 81 mg by mouth daily.     carvedilol (COREG) 6.25 MG tablet Take 1 tablet (6.25 mg total) by mouth 2 (two) times daily with a meal. 180 tablet 3   clopidogrel (PLAVIX) 75 MG tablet Take 1 tablet by mouth once daily 30 tablet 4   dapagliflozin propanediol (FARXIGA) 10 MG TABS tablet TAKE 1 TABLET BY MOUTH ONCE DAILY BEFORE BREAKFAST 90 tablet 3   ezetimibe (ZETIA) 10 MG tablet Take 1 tablet by mouth once daily 90 tablet 1   famotidine (PEPCID) 40 MG tablet Take 40 mg by mouth daily.     fenofibrate (TRICOR) 145 MG tablet Take 1 tablet by mouth once daily 90 tablet 3   hydrALAZINE (APRESOLINE) 50 MG tablet TAKE 1 & 1/2 (ONE & ONE-HALF) TABLETS BY MOUTH THREE TIMES DAILY 405 tablet 5   isosorbide mononitrate (IMDUR) 60 MG 24 hr tablet Take 1 tablet by mouth once daily 90 tablet 1   potassium chloride SA (KLOR-CON M20) 20 MEQ tablet TAKE 1 & 1/2 (ONE & ONE-HALF) TABLETS BY MOUTH ONCE DAILY 90 tablet 3   QUEtiapine (SEROQUEL) 50 MG tablet Take 25 mg by mouth at bedtime.   11   rosuvastatin (CRESTOR) 40 MG tablet Take 1 tablet (40 mg total) by mouth daily. 90  tablet 1   senna-docusate (SENOKOT-S) 8.6-50 MG tablet Take 1 tablet by mouth at bedtime. For constipation 30 tablet 0   spironolactone (ALDACTONE) 25 MG tablet Take 1 tablet by mouth once daily 90 tablet 3   torsemide (DEMADEX) 20 MG tablet TAKE 1 & 1/2 (ONE & ONE-HALF) TABLETS BY MOUTH ONCE DAILY 180 tablet 3   VASCEPA 1 g capsule Take 2 capsules by mouth twice daily 360 capsule 0   No current facility-administered medications for this encounter.   Vitals:   09/07/23 1133  BP: 130/70  Pulse: (!) 51  SpO2: 96%  Weight: 103.9 kg (229 lb)    Wt Readings from Last 3 Encounters:  09/07/23 103.9 kg (229 lb)  07/11/23 107 kg (236 lb)  05/04/23 109.5 kg (241 lb 6.4 oz)    Exam:   BP 130/70   Pulse (!) 51   Wt 103.9 kg (229 lb)   SpO2 96%   BMI 31.06 kg/m  General: NAD Neck: Thick. No JVD, no thyromegaly or thyroid nodule.  Lungs: Clear to auscultation bilaterally with normal respiratory effort. CV: Nondisplaced PMI.  Heart regular S1/S2, no S3/S4, no murmur.  No peripheral edema.  No carotid bruit.  Difficult to palpate pedal pulses.  Abdomen: Soft, nontender, no hepatosplenomegaly, no distention.  Skin: Intact without lesions or rashes.  Neurologic: Alert and oriented x 3.  Psych: Normal affect. Extremities: No clubbing or cyanosis.  HEENT: Normal.   Assessment/Plan: 1. CAD: s/p CABG. No chest pain.    - Continue Crestor, Zetia and Vascepa. Check lipids today.   - Continue ASA 81 daily, Plavix added with TIA.   2. Atrial flutter: Initially occurred post operatively and was transient. Noted to be in Aflutter at office visit in 7/18 and again at hospitalization in 8/18. He had DCCV in 9/18.  He had atrial flutter again in 4/19 and was cardioverted.  He is now s/p atrial flutter ablation and is in NSR.  He is now off Eliquis.  NSR today. Zio monitor in 10/24 after TIA did not show any atrial fibrillation/flutter.  3. Chronic systolic CHF: Ischemic cardiomyopathy.  Echo in 3/17 with  EF 30-35%, improved to 40-45% on echo in 6/17.  Echo 02/2017 EF 40-45%, echo 4/19 with EF 40-45%.  Echo in 7/21 showed stable EF 40-45%.  Echo in 11/22 showed EF 45% with mild RV dysfunction.  Echo in 3/24 showed EF 45% with mild LVH, mild LV dilation, grade 2 diastolic dysfunction, mildly decreased RV systolic function. NYHA class I-II, not volume overloaded on exam.  Quite active.  - Continue Coreg 6.25 mg bid, dose decreased with bradycardia and sinus pauses.   - Continue hydralazine to 75 mg tid and Imdur 60 mg daily.   - Continue torsemide 30 mg daily, BMET/BNP today.  - Continue spironolactone 25 mg daily.   - Continue Farxiga 10 mg daily.  - EF above ICD range.  - He had AKI with ARB and with Entresto so not on either med.   - Repeat echo at followup in 4 months.  5. VT: No further.  - Off amiodarone.  6. CKD stage III: BMET today.  7. Carotid stenosis: s/p R CEA in 2017.   - Repeat carotid dopplers 10/25.  8. Hyperlipidemia: Continue Crestor, fenofibrate, Zetia, and Vascepa.  Check lipids today.  9. PAD: Claudication improved with walking program, not markedly symptomatic at this point.  No rest pain or pedal ulcerations.  ABIs in 10/24 with stable moderate disease.  - Aggressive secondary prevention.  10. Obesity: Weight coming down nicely with diet/exercise.  11. TIA: Suspect TIA with transient left arm weakness in 8/24.  No recurrence.  Zio monitor in 10/24 showed no AF/AFL. Carotid dopplers with minimal disease.  - Worrisome episode, I added Plavix 75 mg daily to his regimen.  12. Bradycardia: Zio monitor in 10/24 showed 242 pauses, longest was 5.6 seconds.  I decreased his Coreg and had him see EP.  He is no longer getting low rate alarms on his Apple Watch.  No lightheadedness or syncope.  Dr. Elberta Fortis with EP recommended medical management (no pacemaker) for now.  - With long pauses, need to rule out OSA.  I will see if we can get him a home sleep study.  - He should go to the ER  if he has a syncopal episode.   Followup in 4 months with echo  I spent 32 minutes reviewing records, interviewing/examining patient, and managing orders.    Signed, [name], MD  09/08/2023   [facility] [address] [facility] [place] [address] [phone] (office) [phone] (fax)

## 2023-09-22 ENCOUNTER — Telehealth (HOSPITAL_COMMUNITY): Payer: Self-pay

## 2023-09-22 NOTE — Telephone Encounter (Signed)
No pre cert required for patients home sleep study, lm on patients vm letting him know he can complete exam.

## 2023-09-23 ENCOUNTER — Encounter (HOSPITAL_COMMUNITY): Payer: Self-pay | Admitting: Cardiology

## 2023-09-25 NOTE — Telephone Encounter (Signed)
Per my chart message  After many days of debating I cannot get myself to do the sleep study. I am not going to disrupt the good night sleep I get (sleep great -do not snore-wake up full of energy) that is the only time my brain shuts down and I get peace. I am not going to do something that's not 100pct accurate and gets me thinking something is wrong and get no sleep! My peace of mind is very important to my health. Hope you understand my point of view and apologize for any inconvenience.

## 2023-10-07 ENCOUNTER — Other Ambulatory Visit (HOSPITAL_COMMUNITY): Payer: Self-pay | Admitting: Cardiology

## 2023-11-15 ENCOUNTER — Other Ambulatory Visit (HOSPITAL_COMMUNITY): Payer: Self-pay | Admitting: Cardiology

## 2023-11-15 DIAGNOSIS — I509 Heart failure, unspecified: Secondary | ICD-10-CM

## 2023-11-22 ENCOUNTER — Other Ambulatory Visit (HOSPITAL_COMMUNITY): Payer: Self-pay | Admitting: Cardiology

## 2023-12-06 ENCOUNTER — Other Ambulatory Visit (HOSPITAL_COMMUNITY): Payer: Self-pay | Admitting: Cardiology

## 2023-12-21 ENCOUNTER — Other Ambulatory Visit (HOSPITAL_COMMUNITY): Payer: Self-pay

## 2023-12-21 ENCOUNTER — Ambulatory Visit (HOSPITAL_COMMUNITY): Payer: Self-pay | Admitting: Cardiology

## 2023-12-21 ENCOUNTER — Encounter (HOSPITAL_COMMUNITY): Payer: Self-pay | Admitting: Cardiology

## 2023-12-21 ENCOUNTER — Ambulatory Visit (HOSPITAL_COMMUNITY)
Admission: RE | Admit: 2023-12-21 | Discharge: 2023-12-21 | Disposition: A | Discharge status: Home / Self Care | Source: Ambulatory Visit | Attending: Cardiology | Admitting: Cardiology

## 2023-12-21 ENCOUNTER — Ambulatory Visit (HOSPITAL_BASED_OUTPATIENT_CLINIC_OR_DEPARTMENT_OTHER)
Admission: RE | Admit: 2023-12-21 | Discharge: 2023-12-21 | Disposition: A | Discharge status: Home / Self Care | Source: Ambulatory Visit | Attending: Cardiology | Admitting: Cardiology

## 2023-12-21 ENCOUNTER — Other Ambulatory Visit (HOSPITAL_COMMUNITY): Payer: Self-pay | Admitting: Cardiology

## 2023-12-21 ENCOUNTER — Inpatient Hospital Stay (HOSPITAL_COMMUNITY)
Admission: RE | Admit: 2023-12-21 | Discharge: 2023-12-21 | Disposition: A | Discharge status: Home / Self Care | Source: Ambulatory Visit | Attending: Cardiology | Admitting: Cardiology

## 2023-12-21 VITALS — BP 158/82 | HR 52 | Ht 72.0 in | Wt 221.2 lb

## 2023-12-21 DIAGNOSIS — Z8673 Personal history of transient ischemic attack (TIA), and cerebral infarction without residual deficits: Secondary | ICD-10-CM | POA: Insufficient documentation

## 2023-12-21 DIAGNOSIS — E669 Obesity, unspecified: Secondary | ICD-10-CM | POA: Insufficient documentation

## 2023-12-21 DIAGNOSIS — Z79899 Other long term (current) drug therapy: Secondary | ICD-10-CM | POA: Insufficient documentation

## 2023-12-21 DIAGNOSIS — Z951 Presence of aortocoronary bypass graft: Secondary | ICD-10-CM | POA: Diagnosis not present

## 2023-12-21 DIAGNOSIS — E785 Hyperlipidemia, unspecified: Secondary | ICD-10-CM | POA: Diagnosis not present

## 2023-12-21 DIAGNOSIS — I255 Ischemic cardiomyopathy: Secondary | ICD-10-CM | POA: Insufficient documentation

## 2023-12-21 DIAGNOSIS — I130 Hypertensive heart and chronic kidney disease with heart failure and stage 1 through stage 4 chronic kidney disease, or unspecified chronic kidney disease: Secondary | ICD-10-CM | POA: Insufficient documentation

## 2023-12-21 DIAGNOSIS — I4892 Unspecified atrial flutter: Secondary | ICD-10-CM | POA: Diagnosis not present

## 2023-12-21 DIAGNOSIS — I5022 Chronic systolic (congestive) heart failure: Secondary | ICD-10-CM | POA: Diagnosis not present

## 2023-12-21 DIAGNOSIS — Z6830 Body mass index (BMI) 30.0-30.9, adult: Secondary | ICD-10-CM | POA: Diagnosis not present

## 2023-12-21 DIAGNOSIS — R001 Bradycardia, unspecified: Secondary | ICD-10-CM

## 2023-12-21 DIAGNOSIS — I2510 Atherosclerotic heart disease of native coronary artery without angina pectoris: Secondary | ICD-10-CM | POA: Insufficient documentation

## 2023-12-21 DIAGNOSIS — Z8249 Family history of ischemic heart disease and other diseases of the circulatory system: Secondary | ICD-10-CM | POA: Diagnosis not present

## 2023-12-21 DIAGNOSIS — Z006 Encounter for examination for normal comparison and control in clinical research program: Secondary | ICD-10-CM

## 2023-12-21 DIAGNOSIS — I4891 Unspecified atrial fibrillation: Secondary | ICD-10-CM | POA: Insufficient documentation

## 2023-12-21 DIAGNOSIS — N183 Chronic kidney disease, stage 3 unspecified: Secondary | ICD-10-CM | POA: Diagnosis not present

## 2023-12-21 DIAGNOSIS — I739 Peripheral vascular disease, unspecified: Secondary | ICD-10-CM | POA: Insufficient documentation

## 2023-12-21 DIAGNOSIS — I252 Old myocardial infarction: Secondary | ICD-10-CM | POA: Insufficient documentation

## 2023-12-21 LAB — CBC
HCT: 49.1 % (ref 39.0–52.0)
Hemoglobin: 16.3 g/dL (ref 13.0–17.0)
MCH: 29.2 pg (ref 26.0–34.0)
MCHC: 33.2 g/dL (ref 30.0–36.0)
MCV: 88 fL (ref 80.0–100.0)
Platelets: 213 10*3/uL (ref 150–400)
RBC: 5.58 MIL/uL (ref 4.22–5.81)
RDW: 14.1 % (ref 11.5–15.5)
WBC: 7.8 10*3/uL (ref 4.0–10.5)
nRBC: 0 % (ref 0.0–0.2)

## 2023-12-21 LAB — BASIC METABOLIC PANEL WITH GFR
Anion gap: 11 (ref 5–15)
BUN: 29 mg/dL — ABNORMAL HIGH (ref 8–23)
CO2: 28 mmol/L (ref 22–32)
Calcium: 9.4 mg/dL (ref 8.9–10.3)
Chloride: 101 mmol/L (ref 98–111)
Creatinine, Ser: 1.9 mg/dL — ABNORMAL HIGH (ref 0.61–1.24)
GFR, Estimated: 39 mL/min — ABNORMAL LOW (ref >60)
Glucose, Bld: 110 mg/dL — ABNORMAL HIGH (ref 70–99)
Potassium: 3.6 mmol/L (ref 3.5–5.1)
Sodium: 140 mmol/L (ref 135–145)

## 2023-12-21 LAB — ECHOCARDIOGRAM COMPLETE
AR max vel: 3.09 cm2
AV Peak grad: 10.2 mmHg
Ao pk vel: 1.6 m/s
Area-P 1/2: 4.29 cm2
Calc EF: 43.5 %
MV VTI: 2.26 cm2
S' Lateral: 4.4 cm
Single Plane A2C EF: 43.5 %
Single Plane A4C EF: 43.8 %

## 2023-12-21 LAB — BRAIN NATRIURETIC PEPTIDE: B Natriuretic Peptide: 394.4 pg/mL — ABNORMAL HIGH (ref 0.0–100.0)

## 2023-12-21 MED ORDER — APIXABAN 5 MG PO TABS: 5.0000 mg | ORAL_TABLET | Freq: Two times a day (BID) | ORAL | 11 refills | Status: AC

## 2023-12-21 NOTE — Patient Instructions (Signed)
 STOP Carvedilol , Asprin and Plavix .  START Eliquis  5 mg Twice daily  Labs done today, your results will be available in MyChart, we will contact you for abnormal readings.  Your provider has recommended that  you wear a Zio Patch for 7 days.  This monitor will record your heart rhythm for our review.  IF you have any symptoms while wearing the monitor please press the button.  If you have any issues with the patch or you notice a red or orange light on it please call the company at [phone].  Once you remove the patch please mail it back to the company as soon as possible so we can get the results.  You have been referred back to Dr. Lawana Pray. His office will call you to arrange your appointment.  Your physician recommends that you schedule a follow-up appointment in: 6 weeks.  If you have any questions or concerns before your next appointment please send us  a message through [place] or call our office at [phone].    TO LEAVE A MESSAGE FOR THE NURSE SELECT OPTION 2, PLEASE LEAVE A MESSAGE INCLUDING: YOUR NAME DATE OF BIRTH CALL BACK NUMBER REASON FOR CALL**this is important as we prioritize the call backs  YOU WILL RECEIVE A CALL BACK THE SAME DAY AS LONG AS YOU CALL BEFORE 4:00 PM  At the [facility], you and your health needs are our priority. As part of our continuing mission to provide you with exceptional heart care, we have created designated Provider Care Teams. These Care Teams include your primary Cardiologist (physician) and Advanced Practice Providers (APPs- Physician Assistants and Nurse Practitioners) who all work together to provide you with the care you need, when you need it.   You may see any of the following providers on your designated Care Team at your next follow up: Dr Jules Oar Dr Peder Bourdon Dr. Alwin Baars Dr. Arta Lark Amy Marijane Shoulders, NP Ruddy Corral, [place] [facility] [place], [place] Dennise Fitz,  NP [place] Lee, NP Shawnee Dellen, NP Luster Salters, PharmD Bevely Brush, PharmD   Please be sure to bring in all your medications bottles to every appointment.    Thank you for choosing [facility]

## 2023-12-21 NOTE — Research (Addendum)
 SITE: [id number]     Subject # [id number]  Subprotocol: A  Inclusion Criteria  Patients who meet all of the following criteria are eligible for enrollment as study participants:  Yes No  Age > 62 years old X   Eligible to wear Holter Study X    Exclusion Criteria  Patients who meet any of these criteria are not eligible for enrollment as study participants: Yes No  1. Receiving any mechanical (respiratory or circulatory) or renal support therapy at Screening or during Visit #1.  X  2.  Any other conditions that in the opinion of the investigators are likely to prevent compliance with the study protocol or pose a safety concern if the subject participates in the study.  X  3. Poor tolerance, namely susceptible to severe skin allergies from ECG adhesive patch application.  X   Protocol: REV H    60 minute start window         Cor device must be applied, and the study initiated, no later than 60 minutes of completing the Echocardiogram                             HH:MM  Echo completion time  15:26  2.   Cor Study start time  15:35   30-Minute execution window  Once Cor Monitoring begins, 3 QT Med ECGs and the 15-minute rest period must be completed within a 30 minute window     HH:MM  3. QT Med ECG Completion time  15:43  4. Start of 15-Min sitting rest period  15:44  5. End of 15-Min rest period  16:00  6. Time of device removal  16:05   *Continue to use the Mobile App Event feature to log the Rest period windows and follow instructions on the EF-ACT Clinical Trial  Patient Instruction Card.  Describe any anomalies in Protocol execution in the Protocol Deviation Log    Residential Zip code [id number] (First 3 digits ONLY)                                           [facility] Informed Consent   Subject Name: Christopher [name]  Subject met inclusion and exclusion criteria.  The informed consent form, study requirements and expectations were reviewed with the subject. Subject had opportunity to  read consent and questions and concerns were addressed prior to the signing of the consent form.  The subject verbalized understanding of the trial requirements.  The subject agreed to participate in the [facility] EF ACT trial and signed the informed consent at 15:29 on 21-Dec-2023.  The informed consent was obtained prior to performance of any protocol-specific procedures for the subject.  A copy of the signed informed consent was given to the subject and a copy was placed in the subject's medical record.   Christopher [name]          Current Outpatient Medications:    acetaminophen  (TYLENOL ) 500 MG tablet, Take 1,000 mg by mouth every 6 (six) hours as needed for mild pain or headache. , Disp: , Rfl:    apixaban  (ELIQUIS ) 5 MG TABS tablet, Take 1 tablet (5 mg total) by mouth 2 (two) times daily., Disp: 60 tablet, Rfl: 11   ezetimibe  (ZETIA ) 10 MG tablet, Take 1 tablet by mouth once daily, Disp: 90 tablet, Rfl: 1  famotidine  (PEPCID ) 40 MG tablet, Take 40 mg by mouth daily., Disp: , Rfl:    FARXIGA  10 MG TABS tablet, TAKE 1 TABLET BY MOUTH ONCE DAILY BEFORE BREAKFAST, Disp: 90 tablet, Rfl: 1   fenofibrate  (TRICOR ) 145 MG tablet, Take 1 tablet by mouth once daily, Disp: 90 tablet, Rfl: 1   hydrALAZINE  (APRESOLINE ) 50 MG tablet, TAKE 1 & 1/2 (ONE & ONE-HALF) TABLETS BY MOUTH THREE TIMES DAILY, Disp: 405 tablet, Rfl: 5   isosorbide  mononitrate (IMDUR ) 60 MG 24 hr tablet, Take 1 tablet by mouth once daily, Disp: 90 tablet, Rfl: 0   potassium chloride  SA (KLOR-CON  M20) 20 MEQ tablet, TAKE 1 & 1/2 (ONE & ONE-HALF) TABLETS BY MOUTH ONCE DAILY, Disp: 90 tablet, Rfl: 3   QUEtiapine  (SEROQUEL ) 50 MG tablet, Take 25 mg by mouth at bedtime. , Disp: , Rfl: 11   rosuvastatin  (CRESTOR ) 40 MG tablet, Take 1 tablet by mouth once daily, Disp: 90 tablet, Rfl: 0   spironolactone  (ALDACTONE ) 25 MG tablet, Take 1 tablet by mouth once daily, Disp: 90 tablet, Rfl: 3   torsemide  (DEMADEX ) 20 MG tablet, TAKE 1 & 1/2 (ONE &  ONE-HALF) TABLETS BY MOUTH ONCE DAILY, Disp: 180 tablet, Rfl: 3   VASCEPA  1 g capsule, Take 2 capsules by mouth twice daily, Disp: 360 capsule, Rfl: 0

## 2023-12-24 NOTE — Progress Notes (Signed)
 ID:  Christopher [name], DOB 1960/10/23, MRN [id number]   Provider location: [facility] Type of Visit: Established patient   PCP: Sylvester Evert, MD  Cardiologist:  Peder Bourdon, MD  Chief complaint: CAD, CHF   History of Present Illness:  Patient has a history of CAD s/p CABG, ischemic cardiomyopathy, atrial fibrillation, and CKD.  In 3/17, he came to the hospital with a late presentation inferior MI.  He was noted to have occluded RCA with collaterals and 95% proximal LAD.  He had CABG x 4 + right CEA.  He had a balloon pump with cardiogenic shock.  He additionally had multiple episodes of post-op VT/VF.  Amiodarone  was started.  Repeat cath showed slow flow in the SVG-D and SVG-ramus given diffusely diseased and small distal targets.  He developed AKI.  He had prolonged mechanical ventilation and a tracheostomy.  He had RV failure that was treated with Revatio .  He had transient atrial flutter that resolved quickly.  Last echo in 3/17 showed EF 30-35%.  Finally, the tracheostomy was removed.  He went to inpatient rehab for about a week and is now home again. Echo 6/17 with EF 40-45%.   Admitted 8/21-8/27/18 with acute respiratory failure due to HCAP and HTN emergency. Also had rapid atrial flutter. Rate improved and DCCV was planned for outpatient as he had missed a few doses of Eliquis  at home. Discharge weight was 270 pounds. He had DCCV to NSR in 9/18.  He was admitted again in 4/19 after syncopal episode and was found to be in atrial flutter again.  He had a LINQ monitor placed and was cardioverted back to NSR.   Atrial flutter ablation in 5/19.  He is now off Eliquis .   Echo in 7/21 showed EF 40-45%, inferior/inferoseptal hypokinesis, mildly decreased RV systolic function.  Echo in 11/22 showed EF 45%, mild LVH and mild LV enlargement, mild RV enlargement with mildly decreased systolic function. Echo in 3/24 showed EF 45% with mild LVH, mild LV dilation, grade 2  diastolic dysfunction, mildly decreased RV systolic function.   Patient had an episode in 8/24 where he had left arm weakness for about 10 minutes.  EMS came out to his house, thought he had had a TIA, but given resolution, they did not take him to the ER.  He has not had any further neurologic symptoms/signs.   Zio monitor (10/24) showed no atrial fibrillation but did show 242 pauses, longest was 5.6 seconds.  I had him decrease Coreg .  He saw EP, and given improved HR with lower Coreg , they elected not to place pacemaker.   Echo was done today and reviewed, EF 40-45%, moderate LVH, mildly decreased RV systolic function, mild MR.  He returns today for followup of CHF and CAD.  Weight is down another 8 lbs.  He is in atrial fibrillation today with rate in upper 30s.  He says that his HR is in the 40s fairly consistently at home.  He recently stopped his Coreg  on his own volition.  He has had no lightheadedness or syncope.  He actually feels quite good, plays golf or rides his exercise bike most days.  No chest pain.  No significant exertional dyspnea.    ECG (personally reviewed): Atrial fibrillation rate 39 with inferior and anterolateral Qs  Labs (4/17): K 3.4, creatinine 1.6 Labs (5/17): K 4.4, creatinine 1.65, LFTs normal, TSH mildly elevated, free T3 and free T4 normal, BNP 502, hemoglobin 12.3 Labs (6/17): BNP 737,  K 4.4, creatinine 1.72 Labs (7/17): LDL 136, K 4.9, creatinine 1.79 Labs (8/17): BNP 239, K 3.8, creatinine 2.17 Labs (10/17): LDL 74, HDL 34, LFTs normal Labs (5/40): K 5, creatinine 1.67, BNP 774 Labs (6/18): K 3.3, creatinine 1.82 Labs 02/06/2017: K 4.3 Creatinine 1.78.  Labs (9/18): K 4.1, creatinine 1.73 Labs (1/19): LDL 48 Labs (4/19): K 4, creatinine 1.38, hgb 15.3 Labs (9/19): K 4.1, creatinine 1.49, LDL 30, TGs 269 Labs (11/19): K 3.8, creatinine 1.43 Labs (9/20): K 3.8, creatinine 1.43, LDL 50, HDL 31, TGs 324 Labs (12/20): K 4.1, creatinine 1.4, LDL 36, TGs  245 Labs (7/21): K 3.6, creatinine 1.32 Labs (9/21): LDL 49, HDL 33, TGs 225 Labs (4/22): LDL 43, TGs 224, K 3.7, creatinine 1.49 Labs (8/22): TGs 350, LDL 19, K 3.8, creatinine 9.81 Labs (11/22): K 3.6, creatinine 1.8, BNP 130 Labs (3/23): LDL 21, TGs 264, K 4, creatinine 1.85 Labs (9/23): K 3.7, creatinine 1.87 Labs (7/24): K 4.6, creatinine 1.89, LDL 33, TGs 187 Labs (1/25): LDL 41, BNP 345, K 3.9, creatinine 1.78  PMH: 1. CAD: Late presentation inferior MI in 3/17.  - LHC showed totally occluded pRCA with L=>R collaterals, 95% pLAD, 70% ramus.   - CABG with LIMA-LAD, SVG-D, SVG-ramus, SVG-RCA.  - Repeat echo in 3/17 after CABG showed patent LIMA and SVG-RCA but SVG-D and SVG- ramus had slow flow with small, diffusely diseased distal targets.  - Echo (6/17) with EF 40-45%, inferior severe hypokinesis, mildly dilated RV with normal systolic function, PASP 30 mmHg.  2. Chronic systolic CHF: Ischemic cardiomyopathy.   - Echo (3/17) with EF 30-35%, septal and inferior AK.   - Echo (7/18): EF 40-45%, moderate FBSH, grade II diastolic dysfunction, RV mildly dilated with normal systolic function.  - Echo (4/19): EF 40-45%, mild LV dilation - Echo (7/21): EF 40-45%, inferior/inferoseptal hypokinesis, grade II diastolic dysfunction, mildly decreased RV systolic function, PASP 42 mmHg.  - Echo (11/22): EF 45%, mild LVH and mild LV enlargement, mild RV enlargement with mildly decreased systolic function. - Echo (3/24): EF 45% with mild LVH, mild LV dilation, grade 2 diastolic dysfunction, mildly decreased RV systolic function.  3. RV failure: was on Revatio , now off.  4. CKD: Stage 3.  5. Atrial flutter: Transient, post-op.   - DCCV to NSR in 9/18. - DCCV to NSR in 4/19.   - Atrial flutter ablation, anticoagulation stopped.  6. VT: Post-op CABG.  7. Carotid stenosis: Right CEA 3/17.  - Carotid dopplers (9/18): 1-39% LICA, s/p R CEA.  - Carotid dopplers (10/19): 1-39% LICA, s/p right CEA -  Carotid dopplers (11/22): Mild disease.  - Carotid dopplers (10/24): Minimal disease, s/p right CEA.  8. PAD: Peripheral artery dopplers (7/17) with > 50% L CIA stenosis, occluded distal external iliac, occluded right SFA, occluded left SFA.  - ABIs (11/22): moderate disease with ABI 0.74 R, 0.64 L - ABIs (10/24): 0.74 R, 0.6 L.  9. Peripheral neuropathy 10. Syncope: 4/19, had LINQ monitor.  11. TIA: 8/24, left arm weakness.  12. Bradycardia: Zio monitor (10/24) showed 242 pauses, longest was 5.6 seconds.  Coreg  decreased.   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   Family History  Problem Relation Age of Onset   Heart disease Mother    Failure to thrive Father    Congestive Heart Failure Father        Deceased   Breast cancer Sister        Living  Review of systems complete and found to be negative unless listed in HPI.   Current Outpatient Medications  Medication Sig Dispense Refill   acetaminophen  (TYLENOL ) 500 MG tablet Take 1,000 mg by mouth every 6 (six) hours as needed for mild pain or headache.      apixaban  (ELIQUIS ) 5 MG TABS tablet Take 1 tablet (5 mg total) by mouth 2 (two) times daily. 60 tablet 11   ezetimibe  (ZETIA ) 10 MG tablet Take 1 tablet by mouth once daily 90 tablet 1   famotidine  (PEPCID ) 40 MG tablet Take 40 mg by mouth daily.     FARXIGA  10 MG TABS tablet TAKE 1 TABLET BY MOUTH ONCE DAILY BEFORE BREAKFAST 90 tablet 1   fenofibrate  (TRICOR ) 145 MG tablet Take 1 tablet by mouth once daily 90 tablet 1   hydrALAZINE  (APRESOLINE ) 50 MG tablet TAKE 1 & 1/2 (ONE & ONE-HALF) TABLETS BY MOUTH THREE TIMES DAILY 405 tablet 5   isosorbide  mononitrate (IMDUR ) 60 MG 24 hr tablet Take 1 tablet by mouth once daily 90 tablet 0   potassium chloride  SA (KLOR-CON  M20) 20 MEQ tablet TAKE 1 & 1/2 (ONE & ONE-HALF) TABLETS BY MOUTH ONCE DAILY 90 tablet 3   QUEtiapine  (SEROQUEL ) 50 MG tablet Take 25 mg by mouth at bedtime.   11   rosuvastatin   (CRESTOR ) 40 MG tablet Take 1 tablet by mouth once daily 90 tablet 0   spironolactone  (ALDACTONE ) 25 MG tablet Take 1 tablet by mouth once daily 90 tablet 3   torsemide  (DEMADEX ) 20 MG tablet TAKE 1 & 1/2 (ONE & ONE-HALF) TABLETS BY MOUTH ONCE DAILY 180 tablet 3   VASCEPA  1 g capsule Take 2 capsules by mouth twice daily 360 capsule 0   No current facility-administered medications for this encounter.   Vitals:   12/21/23 1531  BP: (!) 158/82  Pulse: (!) 52  SpO2: 96%  Weight: 100.3 kg (221 lb 3.2 oz)  Height: 6' (1.829 m)    Wt Readings from Last 3 Encounters:  12/21/23 100.3 kg (221 lb 3.2 oz)  09/07/23 103.9 kg (229 lb)  07/11/23 107 kg (236 lb)    Exam:   BP (!) 158/82   Pulse (!) 52   Ht 6' (1.829 m)   Wt 100.3 kg (221 lb 3.2 oz)   SpO2 96%   BMI 30.00 kg/m  General: NAD Neck: No JVD, no thyromegaly or thyroid  nodule.  Lungs: Clear to auscultation bilaterally with normal respiratory effort. CV: Nondisplaced PMI.  Heart bradycardic, irregular S1/S2, no S3/S4, no murmur.  No peripheral edema.  No carotid bruit.  Normal pedal pulses.  Abdomen: Soft, nontender, no hepatosplenomegaly, no distention.  Skin: Intact without lesions or rashes.  Neurologic: Alert and oriented x 3.  Psych: Normal affect. Extremities: No clubbing or cyanosis.  HEENT: Normal.   Assessment/Plan: 1. CAD: s/p CABG. No chest pain.    - Continue Crestor , Zetia  and Vascepa . Good lipids in 1/25.    - Stop ASA and Plavix  with initiation of Eliquis  (see below).  2. Atrial flutter/atrial fibrillation: Initially occurred post operatively and was transient. Noted to be in Aflutter at office visit in 7/18 and again at hospitalization in 8/18. He had DCCV in 9/18.  He had atrial flutter again in 4/19 and was cardioverted.  He is now s/p atrial flutter ablation and is no longer taking Eliquis .  Today, he is noted to be in slow atrial fibrillation rate 39.  He says that his heart rate at  home is generally in the  40s.   - Stop ASA and Plavix , start Eliquis  5 mg bid.  - I will hold off on cardioversion for now given very slow HR. 3. Chronic systolic CHF: Ischemic cardiomyopathy.  Echo in 3/17 with EF 30-35%, improved to 40-45% on echo in 6/17.  Echo 02/2017 EF 40-45%, echo 4/19 with EF 40-45%.  Echo in 7/21 showed stable EF 40-45%.  Echo in 11/22 showed EF 45% with mild RV dysfunction.  Echo in 3/24 showed EF 45% with mild LVH, mild LV dilation, grade 2 diastolic dysfunction, mildly decreased RV systolic function. Echo was done today and reviewed, EF 40-45%, moderate LVH, mildly decreased RV systolic function, mild MR.  He is not volume overloaded on exam, NYHA class I-II symptoms.   - He has stopped Coreg  on his own, would have him stay off given bradycardia.    - Continue hydralazine  75 mg tid and Imdur  60 mg daily.   - Continue torsemide  30 mg daily, BMET/BNP today.  - Continue spironolactone  25 mg daily.   - Continue Farxiga  10 mg daily.  - EF above ICD range.  - He had AKI with ARB and with Entresto  so not on either med.   5. VT: No further.  - Off amiodarone .  6. CKD stage III: BMET today.  7. Carotid stenosis: s/p R CEA in 2017.   - Repeat carotid dopplers 10/25.  8. Hyperlipidemia: Continue Crestor , fenofibrate , Zetia , and Vascepa .  Good lipids in 1/25.  9. PAD: Claudication improved with walking program, not markedly symptomatic at this point.  No rest pain or pedal ulcerations.  ABIs in 10/24 with stable moderate disease.  - Aggressive secondary prevention.  10. Obesity: Weight coming down nicely with diet/exercise.  11. TIA: Suspect TIA with transient left arm weakness in 8/24.  No recurrence.  Zio monitor in 10/24 showed no AF/AFL. Carotid dopplers with minimal disease.  Patient is in atrial fibrillation today.  - Transition to Eliquis  as above.   12. Bradycardia: Zio monitor in 10/24 showed 242 pauses, longest was 5.6 seconds.  I decreased his Coreg .  He has now stopped his Coreg  due to HR in  40s at home.  Today, HR 39 and in new atrial fibrillation. He has stopped Coreg  on his own.  He does not seem symptomatic.   - He has not wanted to do a sleep study,  - He should go to the ER if he has a syncopal episode.  - I will place 7 day Zio monitor and will have him followup again with EP.  I suspect he is going to need a pacemaker sooner rather than later.   Followup in 6 wks with APP.   I spent 31 minutes reviewing records, interviewing/examining patient, and managing orders.    Signed, Peder Bourdon, MD  12/24/2023   [facility] [address] [facility] [place] [address] [phone] (office) [phone] (fax)

## 2024-01-09 ENCOUNTER — Other Ambulatory Visit (HOSPITAL_COMMUNITY): Payer: Self-pay | Admitting: Cardiology

## 2024-01-14 ENCOUNTER — Ambulatory Visit (HOSPITAL_COMMUNITY): Payer: Self-pay | Admitting: Cardiology

## 2024-01-15 NOTE — Telephone Encounter (Signed)
 Spoke with patient regarding the following results. Patient made aware and patient verbalized understanding.   Made patient aware of ED precautions should new or worsening symptoms develop. Patient verbalized understanding.   Patient aware of appointment time and date with EP. He stated he will keep this appointment.

## 2024-01-15 NOTE — Telephone Encounter (Signed)
-----   Message from Peder Bourdon sent at 01/14/2024  9:05 PM EDT ----- High grade heart block with bradycardia.  Needs urgent appointment with EP, looks like scheduled for 6/10.  If he has episode of presyncope or syncope, should go to ER.  Call to make sure he is asymptomatic and knows about this EP appt.

## 2024-01-16 ENCOUNTER — Ambulatory Visit: Attending: Cardiology | Admitting: Cardiology

## 2024-01-16 ENCOUNTER — Encounter: Payer: Self-pay | Admitting: Cardiology

## 2024-01-16 VITALS — BP 160/94 | HR 64 | Ht 72.0 in | Wt 219.8 lb

## 2024-01-16 DIAGNOSIS — I2510 Atherosclerotic heart disease of native coronary artery without angina pectoris: Secondary | ICD-10-CM

## 2024-01-16 DIAGNOSIS — Z01812 Encounter for preprocedural laboratory examination: Secondary | ICD-10-CM

## 2024-01-16 DIAGNOSIS — I480 Paroxysmal atrial fibrillation: Secondary | ICD-10-CM | POA: Diagnosis not present

## 2024-01-16 DIAGNOSIS — I5022 Chronic systolic (congestive) heart failure: Secondary | ICD-10-CM | POA: Diagnosis not present

## 2024-01-16 DIAGNOSIS — D6869 Other thrombophilia: Secondary | ICD-10-CM | POA: Diagnosis not present

## 2024-01-16 NOTE — Patient Instructions (Signed)
 Medication Instructions:  Your physician recommends that you continue on your current medications as directed. Please refer to the Current Medication list given to you today.  *If you need a refill on your cardiac medications before your next appointment, please call your pharmacy*   Lab Work: Pre procedure labs -- we will call you to schedule:  BMP & CBC  If you have a lab test that is abnormal and we need to change your treatment, we will call you to review the results -- otherwise no news is good news.    Testing/Procedures: Your physician has requested that you have cardiac CT 3 weeks PRIOR to your ablation. Cardiac computed tomography (CT) is a painless test that uses an x-ray machine to take clear, detailed pictures of your heart. We will contact you if the result is abnormal. We will call you to schedule.  Your physician has recommended that you have an ablation. Catheter ablation is a medical procedure used to treat some cardiac arrhythmias (irregular heartbeats). During catheter ablation, a long, thin, flexible tube is put into a blood vessel in your groin (upper thigh), or neck. This tube is called an ablation catheter. It is then guided to your heart through the blood vessel. Radio frequency waves destroy small areas of heart tissue where abnormal heartbeats may cause an arrhythmia to start.   Your ablation is scheduled for 04/18/2024. Please arrive at [facility] at 10:00 am.  We will call/send instructions at a later date.   Follow-Up: At [facility], you and your health needs are our priority.  As part of our continuing mission to provide you with exceptional heart care, we have created designated Provider Care Teams.  These Care Teams include your primary Cardiologist (physician) and Advanced Practice Providers (APPs -  Physician Assistants and Nurse Practitioners) who all work together to provide you with the care you need, when you need it.  Your next appointment:    1 month(s) after your ablation  The format for your next appointment:   In Person  Provider:   [facility] clinic   Thank you for choosing [facility]!!   Reece Cane, RN [phone]    Other Instructions   Cardiac Ablation Cardiac ablation is a procedure to destroy (ablate) some heart tissue that is sending bad signals. These bad signals cause problems in heart rhythm. The heart has many areas that make these signals. If there are problems in these areas, they can make the heart beat in a way that is not normal. Destroying some tissues can help make the heart rhythm normal. Tell your doctor about: Any allergies you have. All medicines you are taking. These include vitamins, herbs, eye drops, creams, and over-the-counter medicines. Any problems you or family members have had with medicines that make you fall asleep (anesthetics). Any blood disorders you have. Any surgeries you have had. Any medical conditions you have, such as kidney failure. Whether you are pregnant or may be pregnant. What are the risks? This is a safe procedure. But problems may occur, including: Infection. Bruising and bleeding. Bleeding into the chest. Stroke or blood clots. Damage to nearby areas of your body. Allergies to medicines or dyes. The need for a pacemaker if the normal system is damaged. Failure of the procedure to treat the problem. What happens before the procedure? Medicines Ask your doctor about: Changing or stopping your normal medicines. This is important. Taking aspirin  and ibuprofen. Do not take these medicines unless your doctor tells you  to take them. Taking other medicines, vitamins, herbs, and supplements. General instructions Follow instructions from your doctor about what you cannot eat or drink. Plan to have someone take you home from the hospital or clinic. If you will be going home right after the procedure, plan to have someone with you for 24 hours. Ask your  doctor what steps will be taken to prevent infection. What happens during the procedure?  An IV tube will be put into one of your veins. You will be given a medicine to help you relax. The skin on your neck or groin will be numbed. A cut (incision) will be made in your neck or groin. A needle will be put through your cut and into a large vein. A tube (catheter) will be put into the needle. The tube will be moved to your heart. Dye may be put through the tube. This helps your doctor see your heart. Small devices (electrodes) on the tube will send out signals. A type of energy will be used to destroy some heart tissue. The tube will be taken out. Pressure will be held on your cut. This helps stop bleeding. A bandage will be put over your cut. The exact procedure may vary among doctors and hospitals. What happens after the procedure? You will be watched until you leave the hospital or clinic. This includes checking your heart rate, breathing rate, oxygen, and blood pressure. Your cut will be watched for bleeding. You will need to lie still for a few hours. Do not drive for 24 hours or as long as your doctor tells you. Summary Cardiac ablation is a procedure to destroy some heart tissue. This is done to treat heart rhythm problems. Tell your doctor about any medical conditions you may have. Tell him or her about all medicines you are taking to treat them. This is a safe procedure. But problems may occur. These include infection, bruising, bleeding, and damage to nearby areas of your body. Follow what your doctor tells you about food and drink. You may also be told to change or stop some of your medicines. After the procedure, do not drive for 24 hours or as long as your doctor tells you. This information is not intended to replace advice given to you by your health care provider. Make sure you discuss any questions you have with your health care provider. Document Revised: 10/15/2021 Document  Reviewed: 06/27/2019 Elsevier Patient Education  2023 [employer]vier Inc[employer]   Cardiac Ablation, Care After  This sheet gives you information about how to care for yourself after your procedure. Your health care provider may also give you more specific instructions. If you have problems or questions, contact your health care provider. What can I expect after the procedure? After the procedure, it is common to have: Bruising around your puncture site. Tenderness around your puncture site. Skipped heartbeats. If you had an atrial fibrillation ablation, you may have atrial fibrillation during the first several months after your procedure.  Tiredness (fatigue).  Follow these instructions at home: Puncture site care  Follow instructions from your health care provider about how to take care of your puncture site. Make sure you: If present, leave stitches (sutures), skin glue, or adhesive strips in place. These skin closures may need to stay in place for up to 2 weeks. If adhesive strip edges start to loosen and curl up, you may trim the loose edges. Do not remove adhesive strips completely unless your health care provider tells you to do  that. If a large square bandage is present, this may be removed 24 hours after surgery.  Check your puncture site every day for signs of infection. Check for: Redness, swelling, or pain. Fluid or blood. If your puncture site starts to bleed, lie down on your back, apply firm pressure to the area, and contact your health care provider. Warmth. Pus or a bad smell. A pea or small marble sized lump at the site is normal and can take up to three months to resolve.  Driving Do not drive for at least 4 days after your procedure or however long your health care provider recommends. (Do not resume driving if you have previously been instructed not to drive for other health reasons.) Do not drive or use heavy machinery while taking prescription pain medicine. Activity Avoid  activities that take a lot of effort for at least 7 days after your procedure. Do not lift anything that is heavier than 5 lb (4.5 kg) for one week.  No sexual activity for 1 week.  Return to your normal activities as told by your health care provider. Ask your health care provider what activities are safe for you. General instructions Take over-the-counter and prescription medicines only as told by your health care provider. Do not use any products that contain nicotine or tobacco, such as cigarettes and e-cigarettes. If you need help quitting, ask your health care provider. You may shower after 24 hours, but Do not take baths, swim, or use a hot tub for 1 week.  Do not drink alcohol for 24 hours after your procedure. Keep all follow-up visits as told by your health care provider. This is important. Contact a health care provider if: You have redness, mild swelling, or pain around your puncture site. You have fluid or blood coming from your puncture site that stops after applying firm pressure to the area. Your puncture site feels warm to the touch. You have pus or a bad smell coming from your puncture site. You have a fever. You have chest pain or discomfort that spreads to your neck, jaw, or arm. You have chest pain that is worse with lying on your back or taking a deep breath. You are sweating a lot. You feel nauseous. You have a fast or irregular heartbeat. You have shortness of breath. You are dizzy or light-headed and feel the need to lie down. You have pain or numbness in the arm or leg closest to your puncture site. Get help right away if: Your puncture site suddenly swells. Your puncture site is bleeding and the bleeding does not stop after applying firm pressure to the area. These symptoms may represent a serious problem that is an emergency. Do not wait to see if the symptoms will go away. Get medical help right away. Call your local emergency services (911 in the [place] Do not  drive yourself to the hospital. Summary After the procedure, it is normal to have bruising and tenderness at the puncture site in your groin, neck, or forearm. Check your puncture site every day for signs of infection. Get help right away if your puncture site is bleeding and the bleeding does not stop after applying firm pressure to the area. This is a medical emergency. This information is not intended to replace advice given to you by your health care provider. Make sure you discuss any questions you have with your health care provider.

## 2024-01-16 NOTE — Progress Notes (Signed)
 Electrophysiology Office Note:   Date:  01/16/2024  ID:  Christopher [name], DOB 1960/09/30, MRN [id number]  Primary Cardiologist: Peder Bourdon, MD Primary Heart Failure: None Electrophysiologist: [place] Liz Cortland Ding, MD      History of Present Illness:   Christopher [name] is a 63 y.o. male with h/o coronary disease post CABG, chronic systolic heart failure, atrial fibrillation/flutter seen today for routine electrophysiology followup.   Since last being seen in our clinic the patient reports doing overall well.  He he has no acute symptoms.  He did go to heart failure clinic and was noted to be in slow atrial fibrillation.  He has not had a recurrence of his atrial flutter.  His carvedilol  dose was stopped due to bradycardia.  He would like to get back into normal rhythm..  he denies chest pain, palpitations, dyspnea, PND, orthopnea, nausea, vomiting, dizziness, syncope, edema, weight gain, or early satiety.   Review of systems complete and found to be negative unless listed in HPI.   EP Information / Studies Reviewed:    EKG is not ordered today. EKG from 12/21/2023 reviewed which showed atrial fibrillation        Risk Assessment/Calculations:    CHA2DS2-VASc Score = 5   This indicates a 7.2% annual risk of stroke. The patient's score is based upon: CHF History: 1 HTN History: 1 Diabetes History: 0 Stroke History: 2 Vascular Disease History: 1 Age Score: 0 Gender Score: 0            Physical Exam:   VS:  BP (!) 160/94 (BP Location: Right Arm, Patient Position: Sitting, Cuff Size: Large)   Pulse 64   Ht 6' (1.829 m)   Wt 219 lb 12.8 oz (99.7 kg)   SpO2 97%   BMI 29.81 kg/m    Wt Readings from Last 3 Encounters:  01/16/24 219 lb 12.8 oz (99.7 kg)  12/21/23 221 lb 3.2 oz (100.3 kg)  09/07/23 229 lb (103.9 kg)     GEN: Well nourished, well developed in no acute distress NECK: No JVD; No carotid bruits CARDIAC: Irregularly irregular rate and rhythm, no murmurs,  rubs, gallops RESPIRATORY:  Clear to auscultation without rales, wheezing or rhonchi  ABDOMEN: Soft, non-tender, non-distended EXTREMITIES:  No edema; No deformity   ASSESSMENT AND PLAN:    1.  Persistent atrial fibrillation/flutter: Has had atrial flutter ablation.  He is now in atrial fibrillation with slow ventricular response.  He would benefit from a rhythm control.  His carvedilol  has been stopped.  We discussed options including medication versus ablation.  He would prefer ablation at this time.  Risk, benefits, and alternatives to EP study and radiofrequency/pulse field ablation for afib were also discussed in detail today. These risks include but are not limited to stroke, bleeding, vascular damage, tamponade, perforation, damage to the esophagus, lungs, and other structures, pulmonary vein stenosis, worsening renal function, and death. The patient understands these risk and wishes to proceed.  We Cameo Shewell therefore proceed with catheter ablation at the next available time.  Carto, ICE, anesthesia are requested for the procedure.  Karim Aiello also obtain CT PV protocol prior to the procedure to exclude LAA thrombus and further evaluate atrial anatomy.  2.  Coronary disease: Post CABG.  No current chest pain.  Continue plan per primary cardiology.  3.  Chronic systolic heart failure: Due to ischemic cardiomyopathy.  Not on carvedilol  but is on other optimal medical therapy.  Zan Triska continue management per primary cardiology.  4.  Carotid stenosis: Post right CEA  5.  Secondary hypercoagulable state: On Eliquis  for atrial fibrillation  7.  Bradycardia: Has had A-fib with slow ventricular response.  Now off of carvedilol .  Lion Fernandez reassess his rhythm after ablation.  Follow up with Afib Clinic as usual post procedure  Signed, Matison Nuccio Cortland Ding, MD

## 2024-01-30 ENCOUNTER — Encounter (HOSPITAL_COMMUNITY): Payer: Self-pay | Admitting: Cardiology

## 2024-01-31 ENCOUNTER — Telehealth (HOSPITAL_COMMUNITY): Payer: Self-pay

## 2024-01-31 NOTE — Telephone Encounter (Signed)
 Called to confirm/remind patient of their appointment at the [facility] on 02/01/24.   Appointment:   [] Confirmed  [x] Left mess   [] No answer/No voice mail  [] VM Full/unable to leave message  [] Phone not in service  And to bring in all medications and/or complete list.

## 2024-01-31 NOTE — Progress Notes (Signed)
 [facility] NOTE PCP: Douglass Ivanoff, MD  Cardiologist:  Ezra Shuck, MD Reason for Visit: Heart Failure Follow-up   HPI: Christopher [name] is a 63 y.o. with CAD s/p CABG, ischemic cardiomyopathy, atrial fibrillation, and CKD.  In 3/17, he came to the hospital with a late presentation inferior MI.  He was noted to have occluded RCA with collaterals and 95% proximal LAD.  He had CABG x 4 + right CEA.  He had IABP with cardiogenic shock.  He additionally had multiple episodes of post-op VT/VF.  Repeat cath showed slow flow in the SVG-D and SVG-ramus given diffusely diseased and small distal targets.  He developed AKI.  He had prolonged mechanical ventilation and a tracheostomy.  He had RV failure that was treated with Revatio .  He had transient atrial flutter that resolved quickly.  Last echo in 3/17 showed EF 30-35%.  Finally, the tracheostomy was removed.  He went to inpatient rehab for about a week and is now home again. Echo 6/17 with EF 40-45%.   Admitted 8/18 with acute respiratory failure due to HCAP and HTN emergency. Also had rapid atrial flutter. Rate improved and DCCV was planned for outpatient as he had missed a few doses of Eliquis  at home. Discharge weight was 270 pounds. He had DCCV to NSR in 9/18.  He was admitted again in 4/19 after syncopal episode and was found to be in atrial flutter again.  He had a LINQ monitor placed and was cardioverted back to NSR.   Atrial flutter ablation in 5/19.  He is now off Eliquis .   Echo in 7/21 showed EF 40-45%, inferior/inferoseptal hypokinesis, mildly decreased RV systolic function.  Echo in 11/22 showed EF 45%, mild LVH and mild LV enlargement, mild RV enlargement with mildly decreased systolic function. Echo in 3/24 showed EF 45% with mild LVH, mild LV dilation, grade 2 diastolic dysfunction, mildly decreased RV systolic function.   Patient had an episode in 8/24 where he had left arm weakness for about 10 minutes.  EMS came out to his  house, thought he had had a TIA, but given resolution, they did not take him to the ER.  He has not had any further neurologic symptoms/signs.   Zio monitor (10/24) showed no atrial fibrillation but did show 242 pauses, longest was 5.6 seconds.  I had him decrease Coreg .  He saw EP, and given improved HR with lower Coreg , they elected not to place pacemaker.   Echo (5/25) EF 40-45%, moderate LVH, mildly decreased RV systolic function, mild MR.  Zio (6/25): avg HR 52 bpm. Predominantly SR. AF burden 3%, 4 pauses (longest 3.3 seconds) in the setting of high grade AVB   He has since been seen by EP and is scheduled for atrial fibrillation with Dr. Inocencio on 9/11.  He returns today for heart failure follow up with wife. Overall feeling good. NYHA I. Still playing golf regularly. Trying to lose weight. Denies chest pain, dyspnea, fatigue, near-syncope, orthopnea, palpitations, dizziness, and abnormal bleeding.  Appetite okay. Has been trying to lose weight, states that he makes headway and then gains some back. BP at OPVs has been elevated since stopping Coreg . Compliant with all medications.  PMH: 1. CAD: Late presentation inferior MI in 3/17.  - LHC showed totally occluded pRCA with L=>R collaterals, 95% pLAD, 70% ramus.   - CABG with LIMA-LAD, SVG-D, SVG-ramus, SVG-RCA.  - Repeat echo in 3/17 after CABG showed patent LIMA and SVG-RCA but SVG-D and SVG-  ramus had slow flow with small, diffusely diseased distal targets.  - Echo (6/17) with EF 40-45%, inferior severe hypokinesis, mildly dilated RV with normal systolic function, PASP 30 mmHg.  2. Chronic systolic CHF: Ischemic cardiomyopathy.   - Echo (3/17) with EF 30-35%, septal and inferior AK.   - Echo (7/18): EF 40-45%, moderate FBSH, grade II diastolic dysfunction, RV mildly dilated with normal systolic function.  - Echo (4/19): EF 40-45%, mild LV dilation - Echo (7/21): EF 40-45%, inferior/inferoseptal hypokinesis, grade II diastolic  dysfunction, mildly decreased RV systolic function, PASP 42 mmHg.  - Echo (11/22): EF 45%, mild LVH and mild LV enlargement, mild RV enlargement with mildly decreased systolic function. - Echo (3/24): EF 45% with mild LVH, mild LV dilation, grade 2 diastolic dysfunction, mildly decreased RV systolic function.  3. RV failure: was on Revatio , now off.  4. CKD: Stage 3.  5. Atrial flutter: Transient, post-op.   - DCCV to NSR in 9/18. - DCCV to NSR in 4/19.   - Atrial flutter ablation, anticoagulation stopped.  6. VT: Post-op CABG.  7. Carotid stenosis: Right CEA 3/17.  - Carotid dopplers (9/18): 1-39% LICA, s/p R CEA.  - Carotid dopplers (10/19): 1-39% LICA, s/p right CEA - Carotid dopplers (11/22): Mild disease.  - Carotid dopplers (10/24): Minimal disease, s/p right CEA.  8. PAD: Peripheral artery dopplers (7/17) with > 50% L CIA stenosis, occluded distal external iliac, occluded right SFA, occluded left SFA.  - ABIs (11/22): moderate disease with ABI 0.74 R, 0.64 L - ABIs (10/24): 0.74 R, 0.6 L.  9. Peripheral neuropathy 10. Syncope: 4/19, had LINQ monitor.  11. TIA: 8/24, left arm weakness.  12. Bradycardia: Zio monitor (10/24) showed 242 pauses, longest was 5.6 seconds.  Coreg  decreased.   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   Family History  Problem Relation Age of Onset   Heart disease Mother    Failure to thrive Father    Congestive Heart Failure Father        Deceased   Breast cancer Sister        Living   Review of systems complete and found to be negative unless listed in HPI.   Current Outpatient Medications  Medication Sig Dispense Refill   acetaminophen  (TYLENOL ) 500 MG tablet Take 1,000 mg by mouth every 6 (six) hours as needed for mild pain or headache.      apixaban  (ELIQUIS ) 5 MG TABS tablet Take 1 tablet (5 mg total) by mouth 2 (two) times daily. 60 tablet 11   ezetimibe  (ZETIA ) 10 MG tablet Take 1 tablet by mouth once  daily 90 tablet 1   famotidine  (PEPCID ) 40 MG tablet Take 40 mg by mouth daily.     FARXIGA  10 MG TABS tablet TAKE 1 TABLET BY MOUTH ONCE DAILY BEFORE BREAKFAST 90 tablet 1   fenofibrate  (TRICOR ) 145 MG tablet Take 1 tablet by mouth once daily 90 tablet 1   hydrALAZINE  (APRESOLINE ) 50 MG tablet TAKE 1 & 1/2 (ONE & ONE-HALF) TABLETS BY MOUTH THREE TIMES DAILY 405 tablet 5   isosorbide  mononitrate (IMDUR ) 60 MG 24 hr tablet Take 1 tablet by mouth once daily 90 tablet 0   potassium chloride  SA (KLOR-CON  M20) 20 MEQ tablet TAKE 1 & 1/2 (ONE & ONE-HALF) TABLETS BY MOUTH ONCE DAILY 90 tablet 3   QUEtiapine  (SEROQUEL ) 50 MG tablet Take 25 mg by mouth at bedtime.   11   rosuvastatin  (CRESTOR ) 40 MG  tablet Take 1 tablet by mouth once daily 90 tablet 0   spironolactone  (ALDACTONE ) 25 MG tablet Take 1 tablet by mouth once daily 90 tablet 3   torsemide  (DEMADEX ) 20 MG tablet TAKE 1 & 1/2 (ONE & ONE-HALF) TABLETS BY MOUTH ONCE DAILY 180 tablet 3   VASCEPA  1 g capsule Take 2 capsules by mouth twice daily 360 capsule 0   No current facility-administered medications for this encounter.   Vitals:   02/01/24 1111  BP: (!) 156/78  Pulse: (!) 59  SpO2: 97%  Weight: 100.1 kg (220 lb 9.6 oz)  Height: 5' 10.5 (1.791 m)     Wt Readings from Last 3 Encounters:  02/01/24 100.1 kg (220 lb 9.6 oz)  01/16/24 99.7 kg (219 lb 12.8 oz)  12/21/23 100.3 kg (221 lb 3.2 oz)    BP (!) 156/78 (BP Location: Left Arm, Patient Position: Sitting)   Pulse (!) 59   Ht 5' 10.5 (1.791 m)   Wt 100.1 kg (220 lb 9.6 oz)   SpO2 97%   BMI 31.21 kg/m   Physical Exam: General: Well appearing. No distress on RA Cardiac: JVP flat. S1 and S2 present. No murmurs or rub. Extremities: Warm and dry.  No edema.  Neuro: Alert and oriented x3. Affect pleasant. Moves all extremities without difficulty.  Assessment/Plan: 1. CAD: s/p CABG. No chest pain.    - Continue Crestor , Zetia  and Vascepa . Good lipids in 1/25.    - Stop ASA and  Plavix  with initiation of Eliquis  (see below).  2. Atrial flutter/atrial fibrillation: Initially occurred post operatively and was transient. Noted to be in Aflutter at office visit in 7/18 and again at hospitalization in 8/18. He had DCCV in 9/18.  He had atrial flutter again in 4/19 and was cardioverted.  s/p atrial flutter ablation in 2019. Again in atrial fibrillation with slow ventricular response, HR 39 bpm. Has been seen by Dr. Inocencio, plan for repeat AF ablation 04/18/24.   - Stop ASA and Plavix . Continue Eliquis  5 mg bid.  3. Chronic systolic CHF: Ischemic cardiomyopathy.  Echo in 3/17 with EF 30-35%, improved to 40-45% on echo in 6/17.  Echo 02/2017 EF 40-45%, echo 4/19 with EF 40-45%.  Echo in 7/21 showed stable EF 40-45%.  Echo in 11/22 showed EF 45% with mild RV dysfunction.  Echo in 3/24 showed EF 45% with mild LVH, mild LV dilation, grade 2 diastolic dysfunction, mildly decreased RV systolic function. Echo 5/25, EF 40-45%, moderate LVH, mildly decreased RV systolic function, mild MR.   - He is not volume overloaded on exam, NYHA class I-II symptoms.   - Off ? blocker with bradycardia - Increase hydralazine  to 100 mg tid and continue Imdur  60 mg daily.   - Continue torsemide  30 mg daily, BMET/BNP today.  - Continue spironolactone  25 mg daily.   - Continue Farxiga  10 mg daily.  - He had AKI with ARB and with Entresto  so not on either. BMET today.  5. VT: No further.  - Off amiodarone .  6. CKD stage III: BMET today.  7. Carotid stenosis: s/p R CEA in 2017.   - Repeat carotid dopplers 10/25.  8. Hyperlipidemia: Continue Crestor , fenofibrate , Zetia , and Vascepa .  Good lipids in 1/25.  9. PAD: Claudication improved with walking program, not markedly symptomatic at this point.  No rest pain or pedal ulcerations.  ABIs in 10/24 with stable moderate disease.  - Aggressive secondary prevention.  10. Obesity: Weight coming down nicely with diet/exercise.  11. TIA: Suspect TIA with transient  left arm weakness in 8/24.  No recurrence.  Zio monitor in 10/24 showed no AF/AFL. Carotid dopplers with minimal disease.  - on eliquis  12. Bradycardia: Zio monitor in 10/24 showed 242 pauses, longest was 5.6 seconds. He has now stopped his Coreg  due to HR in 40s at home. HR 39 and in new atrial fibrillation at last OPV. Asymptomatic. Zio 01/14/24 with 3% AF burden and 4 pauses (longest 3.3s) related to 2AVB.  - HR 59 in clinic today, reports has been higher at home with being off coreg .  - He has not wanted to do a sleep study - No syncope, dizziness. Will go to ED if symptoms develops.  - He should go to the ER if he has a syncopal episode.  - Seen by Dr. Inocencio, scheduled for repeat AF ablation in 04/18/24.   Follow up in 5 month with Dr. McLean  [place] Eula Jaster, NP  02/01/2024

## 2024-02-01 ENCOUNTER — Encounter (HOSPITAL_COMMUNITY): Payer: Self-pay

## 2024-02-01 ENCOUNTER — Ambulatory Visit (HOSPITAL_COMMUNITY)
Admission: RE | Admit: 2024-02-01 | Discharge: 2024-02-01 | Disposition: A | Discharge status: Home / Self Care | Source: Ambulatory Visit | Attending: Cardiology | Admitting: Cardiology

## 2024-02-01 ENCOUNTER — Ambulatory Visit (HOSPITAL_COMMUNITY): Payer: Self-pay | Admitting: Cardiology

## 2024-02-01 VITALS — BP 156/78 | HR 59 | Ht 70.5 in | Wt 220.6 lb

## 2024-02-01 DIAGNOSIS — I130 Hypertensive heart and chronic kidney disease with heart failure and stage 1 through stage 4 chronic kidney disease, or unspecified chronic kidney disease: Secondary | ICD-10-CM | POA: Diagnosis not present

## 2024-02-01 DIAGNOSIS — Z951 Presence of aortocoronary bypass graft: Secondary | ICD-10-CM | POA: Insufficient documentation

## 2024-02-01 DIAGNOSIS — I2510 Atherosclerotic heart disease of native coronary artery without angina pectoris: Secondary | ICD-10-CM | POA: Diagnosis not present

## 2024-02-01 DIAGNOSIS — I255 Ischemic cardiomyopathy: Secondary | ICD-10-CM | POA: Insufficient documentation

## 2024-02-01 DIAGNOSIS — I4892 Unspecified atrial flutter: Secondary | ICD-10-CM | POA: Insufficient documentation

## 2024-02-01 DIAGNOSIS — E785 Hyperlipidemia, unspecified: Secondary | ICD-10-CM | POA: Diagnosis not present

## 2024-02-01 DIAGNOSIS — I5022 Chronic systolic (congestive) heart failure: Secondary | ICD-10-CM | POA: Diagnosis present

## 2024-02-01 DIAGNOSIS — Z79899 Other long term (current) drug therapy: Secondary | ICD-10-CM | POA: Insufficient documentation

## 2024-02-01 DIAGNOSIS — N183 Chronic kidney disease, stage 3 unspecified: Secondary | ICD-10-CM | POA: Diagnosis not present

## 2024-02-01 DIAGNOSIS — R001 Bradycardia, unspecified: Secondary | ICD-10-CM | POA: Diagnosis not present

## 2024-02-01 DIAGNOSIS — E66811 Obesity, class 1: Secondary | ICD-10-CM

## 2024-02-01 DIAGNOSIS — I480 Paroxysmal atrial fibrillation: Secondary | ICD-10-CM | POA: Diagnosis not present

## 2024-02-01 DIAGNOSIS — Z7984 Long term (current) use of oral hypoglycemic drugs: Secondary | ICD-10-CM | POA: Insufficient documentation

## 2024-02-01 DIAGNOSIS — Z6831 Body mass index (BMI) 31.0-31.9, adult: Secondary | ICD-10-CM

## 2024-02-01 LAB — BASIC METABOLIC PANEL WITH GFR
Anion gap: 12 (ref 5–15)
BUN: 22 mg/dL (ref 8–23)
CO2: 26 mmol/L (ref 22–32)
Calcium: 9.6 mg/dL (ref 8.9–10.3)
Chloride: 102 mmol/L (ref 98–111)
Creatinine, Ser: 1.81 mg/dL — ABNORMAL HIGH (ref 0.61–1.24)
GFR, Estimated: 42 mL/min — ABNORMAL LOW (ref >60)
Glucose, Bld: 108 mg/dL — ABNORMAL HIGH (ref 70–99)
Potassium: 3.7 mmol/L (ref 3.5–5.1)
Sodium: 140 mmol/L (ref 135–145)

## 2024-02-01 MED ORDER — HYDRALAZINE HCL 50 MG PO TABS
100.0000 mg | ORAL_TABLET | Freq: Three times a day (TID) | ORAL | 3 refills | Status: AC
Start: 2024-02-01 — End: ?

## 2024-02-01 NOTE — Patient Instructions (Addendum)
 Thank you for coming in today  If you had labs drawn today, any labs that are abnormal the clinic will call you No news is good news  Medications: Increase Hydralazine  to 100 mg three time a day   Follow up appointments:  Your physician recommends that you schedule a follow-up appointment in:  5-6 months with With Dr. Rolan  Please call our office to schedule the follow-up appointment in October for December 2025/January 2026 appointment .    Do the following things EVERYDAY: Weigh yourself in the morning before breakfast. Write it down and keep it in a log. Take your medicines as prescribed Eat low salt foods--Limit salt (sodium) to 2000 mg per day.  Stay as active as you can everyday Limit all fluids for the day to less than 2 liters   At the [facility], you and your health needs are our priority. As part of our continuing mission to provide you with exceptional heart care, we have created designated Provider Care Teams. These Care Teams include your primary Cardiologist (physician) and Advanced Practice Providers (APPs- Physician Assistants and Nurse Practitioners) who all work together to provide you with the care you need, when you need it.   You may see any of the following providers on your designated Care Team at your next follow up: Dr Toribio Fuel Dr Ezra Rolan Dr. Ria Gardenia Greig Lenetta, NP Caffie Shed, [place] [facility] [place], [place] Beckey Coe, NP Tinnie Redman, PharmD   Please be sure to bring in all your medications bottles to every appointment.    Thank you for choosing [facility]  If you have any questions or concerns before your next appointment please send us  a message through [facility] or call our office at [phone].    TO LEAVE A MESSAGE FOR THE NURSE SELECT OPTION 2, PLEASE LEAVE A MESSAGE INCLUDING: YOUR NAME DATE OF BIRTH CALL BACK NUMBER REASON FOR CALL**this  is important as we prioritize the call backs  YOU WILL RECEIVE A CALL BACK THE SAME DAY AS LONG AS YOU CALL BEFORE 4:00 PM

## 2024-02-12 ENCOUNTER — Other Ambulatory Visit (HOSPITAL_COMMUNITY): Payer: Self-pay | Admitting: Cardiology

## 2024-02-12 DIAGNOSIS — I509 Heart failure, unspecified: Secondary | ICD-10-CM

## 2024-02-15 ENCOUNTER — Other Ambulatory Visit (HOSPITAL_COMMUNITY): Payer: Self-pay | Admitting: Cardiology

## 2024-02-18 ENCOUNTER — Other Ambulatory Visit (HOSPITAL_COMMUNITY): Payer: Self-pay | Admitting: Cardiology

## 2024-03-24 ENCOUNTER — Other Ambulatory Visit: Payer: Self-pay

## 2024-03-24 DIAGNOSIS — Z01812 Encounter for preprocedural laboratory examination: Secondary | ICD-10-CM

## 2024-03-24 DIAGNOSIS — I480 Paroxysmal atrial fibrillation: Secondary | ICD-10-CM

## 2024-03-25 ENCOUNTER — Telehealth: Payer: Self-pay

## 2024-03-25 NOTE — Telephone Encounter (Signed)
 Called pt to go over CT/Ablation Instructions.  He will have updated labs done today 8/18... CT scheduled on 8/21 at 11:00 am.. Ablation on 9/11 at 10:00 am...  Pt aware I sent Instructions via MyChart and will review them and call back or send MyChart message with any questions or concerns.

## 2024-03-26 LAB — BASIC METABOLIC PANEL WITH GFR
BUN/Creatinine Ratio: 14 (ref 10–24)
BUN: 28 mg/dL — ABNORMAL HIGH (ref 8–27)
CO2: 22 mmol/L (ref 20–29)
Calcium: 9.6 mg/dL (ref 8.6–10.2)
Chloride: 97 mmol/L (ref 96–106)
Creatinine, Ser: 1.95 mg/dL — ABNORMAL HIGH (ref 0.76–1.27)
Glucose: 95 mg/dL (ref 70–99)
Potassium: 4 mmol/L (ref 3.5–5.2)
Sodium: 137 mmol/L (ref 134–144)
eGFR: 38 mL/min/1.73 — ABNORMAL LOW (ref >59)

## 2024-03-26 LAB — CBC
Hematocrit: 54.9 % — ABNORMAL HIGH (ref 37.5–51.0)
Hemoglobin: 18.2 g/dL — ABNORMAL HIGH (ref 13.0–17.7)
MCH: 30.3 pg (ref 26.6–33.0)
MCHC: 33.2 g/dL (ref 31.5–35.7)
MCV: 91 fL (ref 79–97)
Platelets: 188 x10E3/uL (ref 150–450)
RBC: 6.01 x10E6/uL — ABNORMAL HIGH (ref 4.14–5.80)
RDW: 14.1 % (ref 11.6–15.4)
WBC: 8.8 x10E3/uL (ref 3.4–10.8)

## 2024-03-28 ENCOUNTER — Ambulatory Visit (HOSPITAL_COMMUNITY)
Admission: RE | Admit: 2024-03-28 | Discharge: 2024-03-28 | Disposition: A | Discharge status: Home / Self Care | Source: Ambulatory Visit | Attending: Cardiology | Admitting: Cardiology

## 2024-03-28 DIAGNOSIS — I480 Paroxysmal atrial fibrillation: Secondary | ICD-10-CM | POA: Diagnosis not present

## 2024-03-28 MED ORDER — IOHEXOL 350 MG/ML SOLN
100.0000 mL | Freq: Once | INTRAVENOUS | Status: AC | PRN
  Administered 2024-03-28: 100 mL via INTRAVENOUS

## 2024-04-13 ENCOUNTER — Other Ambulatory Visit (HOSPITAL_COMMUNITY): Payer: Self-pay | Admitting: Cardiology

## 2024-04-17 NOTE — Pre-Procedure Instructions (Signed)
 Instructed patient on the following items: Arrival time 0800 Nothing to eat or drink after midnight No meds AM of procedure Responsible person to drive you home and stay with you for 24 hrs  Have you missed any doses of anti-coagulant Eliquis- takes twice a day, hasn't missed any doses in last 4 weeks.  Don't take dose morning of procedure.

## 2024-04-18 ENCOUNTER — Ambulatory Visit (HOSPITAL_COMMUNITY): Admitting: Certified Registered Nurse Anesthetist

## 2024-04-18 ENCOUNTER — Ambulatory Visit (HOSPITAL_COMMUNITY)
Admission: RE | Admit: 2024-04-18 | Discharge: 2024-04-18 | Disposition: A | Discharge status: Home / Self Care | Attending: Cardiology | Admitting: Cardiology

## 2024-04-18 ENCOUNTER — Ambulatory Visit (HOSPITAL_COMMUNITY)
Admission: RE | Disposition: A | Discharge status: Home / Self Care | Payer: Self-pay | Source: Home / Self Care | Attending: Cardiology

## 2024-04-18 ENCOUNTER — Other Ambulatory Visit: Payer: Self-pay

## 2024-04-18 DIAGNOSIS — Z8673 Personal history of transient ischemic attack (TIA), and cerebral infarction without residual deficits: Secondary | ICD-10-CM | POA: Diagnosis not present

## 2024-04-18 DIAGNOSIS — I110 Hypertensive heart disease with heart failure: Secondary | ICD-10-CM | POA: Diagnosis not present

## 2024-04-18 DIAGNOSIS — I252 Old myocardial infarction: Secondary | ICD-10-CM | POA: Insufficient documentation

## 2024-04-18 DIAGNOSIS — I10 Essential (primary) hypertension: Secondary | ICD-10-CM

## 2024-04-18 DIAGNOSIS — I4819 Other persistent atrial fibrillation: Secondary | ICD-10-CM

## 2024-04-18 DIAGNOSIS — I5022 Chronic systolic (congestive) heart failure: Secondary | ICD-10-CM | POA: Insufficient documentation

## 2024-04-18 DIAGNOSIS — I2510 Atherosclerotic heart disease of native coronary artery without angina pectoris: Secondary | ICD-10-CM | POA: Diagnosis not present

## 2024-04-18 DIAGNOSIS — Z951 Presence of aortocoronary bypass graft: Secondary | ICD-10-CM | POA: Diagnosis not present

## 2024-04-18 DIAGNOSIS — Z87891 Personal history of nicotine dependence: Secondary | ICD-10-CM

## 2024-04-18 DIAGNOSIS — I4891 Unspecified atrial fibrillation: Secondary | ICD-10-CM | POA: Diagnosis not present

## 2024-04-18 DIAGNOSIS — I739 Peripheral vascular disease, unspecified: Secondary | ICD-10-CM | POA: Insufficient documentation

## 2024-04-18 HISTORY — PX: ATRIAL FIBRILLATION ABLATION: EP1191

## 2024-04-18 LAB — POCT ACTIVATED CLOTTING TIME: Activated Clotting Time: 308 s

## 2024-04-18 SURGERY — ATRIAL FIBRILLATION ABLATION
Anesthesia: General

## 2024-04-18 MED ORDER — ATROPINE SULFATE 1 MG/10ML IJ SOSY
PREFILLED_SYRINGE | INTRAMUSCULAR | Status: AC
  Filled 2024-04-18: qty 10

## 2024-04-18 MED ORDER — PHENYLEPHRINE 80 MCG/ML (10ML) SYRINGE FOR IV PUSH (FOR BLOOD PRESSURE SUPPORT)
PREFILLED_SYRINGE | INTRAVENOUS | Status: DC | PRN
  Administered 2024-04-18 (×2): 80 ug via INTRAVENOUS

## 2024-04-18 MED ORDER — ONDANSETRON HCL 4 MG/2ML IJ SOLN: 4.0000 mg | Freq: Four times a day (QID) | INTRAMUSCULAR | Status: DC | PRN

## 2024-04-18 MED ORDER — ONDANSETRON HCL 4 MG/2ML IJ SOLN
INTRAMUSCULAR | Status: DC | PRN
Start: 2024-04-18 — End: 2024-04-18
  Administered 2024-04-18: 4 mg via INTRAVENOUS

## 2024-04-18 MED ORDER — FENTANYL CITRATE (PF) 100 MCG/2ML IJ SOLN
INTRAMUSCULAR | Status: AC
  Filled 2024-04-18: qty 2

## 2024-04-18 MED ORDER — SUGAMMADEX SODIUM 200 MG/2ML IV SOLN
INTRAVENOUS | Status: DC | PRN
  Administered 2024-04-18: 200 mg via INTRAVENOUS

## 2024-04-18 MED ORDER — SODIUM CHLORIDE 0.9 % IV SOLN: 250.0000 mL | INTRAVENOUS | Status: DC | PRN

## 2024-04-18 MED ORDER — LIDOCAINE 2% (20 MG/ML) 5 ML SYRINGE
INTRAMUSCULAR | Status: DC | PRN
  Administered 2024-04-18: 100 mg via INTRAVENOUS

## 2024-04-18 MED ORDER — PROTAMINE SULFATE 10 MG/ML IV SOLN
INTRAVENOUS | Status: DC | PRN
Start: 2024-04-18 — End: 2024-04-18
  Administered 2024-04-18 (×2): 10 mg via INTRAVENOUS

## 2024-04-18 MED ORDER — SODIUM CHLORIDE 0.9 % IV SOLN: INTRAVENOUS | Status: DC

## 2024-04-18 MED ORDER — PROPOFOL 10 MG/ML IV BOLUS
INTRAVENOUS | Status: DC | PRN
  Administered 2024-04-18: 50 mg via INTRAVENOUS
  Administered 2024-04-18: 150 mg via INTRAVENOUS

## 2024-04-18 MED ORDER — FENTANYL CITRATE (PF) 250 MCG/5ML IJ SOLN
INTRAMUSCULAR | Status: DC | PRN
  Administered 2024-04-18 (×2): 50 ug via INTRAVENOUS

## 2024-04-18 MED ORDER — GLYCOPYRROLATE 0.2 MG/ML IJ SOLN
INTRAMUSCULAR | Status: DC | PRN
  Administered 2024-04-18: .1 mg via INTRAVENOUS

## 2024-04-18 MED ORDER — DEXAMETHASONE SODIUM PHOSPHATE 10 MG/ML IJ SOLN
INTRAMUSCULAR | Status: DC | PRN
  Administered 2024-04-18: 5 mg via INTRAVENOUS

## 2024-04-18 MED ORDER — SODIUM CHLORIDE 0.9% FLUSH: 3.0000 mL | Freq: Two times a day (BID) | INTRAVENOUS | Status: DC

## 2024-04-18 MED ORDER — SODIUM CHLORIDE 0.9% FLUSH: 3.0000 mL | INTRAVENOUS | Status: DC | PRN

## 2024-04-18 MED ORDER — PHENYLEPHRINE HCL-NACL 20-0.9 MG/250ML-% IV SOLN
INTRAVENOUS | Status: DC | PRN
  Administered 2024-04-18: 30 ug/min via INTRAVENOUS

## 2024-04-18 MED ORDER — ACETAMINOPHEN 325 MG PO TABS: 650.0000 mg | ORAL_TABLET | ORAL | Status: DC | PRN

## 2024-04-18 MED ORDER — HEPARIN (PORCINE) IN NACL 1000-0.9 UT/500ML-% IV SOLN
INTRAVENOUS | Status: DC | PRN
  Administered 2024-04-18 (×3): 500 mL

## 2024-04-18 MED ORDER — ATROPINE SULFATE 1 MG/10ML IJ SOSY
PREFILLED_SYRINGE | INTRAMUSCULAR | Status: DC | PRN
  Administered 2024-04-18: 1 mg via INTRAVENOUS

## 2024-04-18 MED ORDER — ROCURONIUM BROMIDE 10 MG/ML (PF) SYRINGE
PREFILLED_SYRINGE | INTRAVENOUS | Status: DC | PRN
  Administered 2024-04-18: 50 mg via INTRAVENOUS
  Administered 2024-04-18: 15 mg via INTRAVENOUS

## 2024-04-18 MED ORDER — HEPARIN SODIUM (PORCINE) 1000 UNIT/ML IJ SOLN
INTRAMUSCULAR | Status: DC | PRN
  Administered 2024-04-18: 3000 [IU] via INTRAVENOUS
  Administered 2024-04-18: 16000 [IU] via INTRAVENOUS

## 2024-04-18 SURGICAL SUPPLY — 20 items
BAG SNAP BAND KOVER 36X36 (MISCELLANEOUS) IMPLANT
BLANKET WARM UNDERBOD FULL ACC (MISCELLANEOUS) ×1 IMPLANT
CABLE FARASTAR GEN2 SNGL USE (CABLE) IMPLANT
CATH FARAWAVE 2.0 31 (CATHETERS) IMPLANT
CATH GE 8FR SOUNDSTAR (CATHETERS) IMPLANT
CATH OCTARAY 2.0 F 3-3-3-3-3 (CATHETERS) IMPLANT
CATH WEBSTER BI DIR CS D-F CRV (CATHETERS) IMPLANT
CLOSURE MYNX CONTROL 6F/7F (Vascular Products) IMPLANT
CLOSURE PERCLOSE PROSTYLE (VASCULAR PRODUCTS) IMPLANT
COVER SWIFTLINK CONNECTOR (BAG) ×1 IMPLANT
DILATOR VESSEL 38 20CM 16FR (INTRODUCER) IMPLANT
GUIDEWIRE INQWIRE 1.5J.035X260 (WIRE) IMPLANT
KIT VERSACROSS CNCT FARADRIVE (KITS) IMPLANT
PACK EP LF (CUSTOM PROCEDURE TRAY) ×1 IMPLANT
PAD DEFIB RADIO PHYSIO CONN (PAD) ×1 IMPLANT
PATCH CARTO3 (PAD) IMPLANT
SHEATH FARADRIVE STEERABLE (SHEATH) IMPLANT
SHEATH PINNACLE 8F 10CM (SHEATH) IMPLANT
SHEATH PINNACLE 9F 10CM (SHEATH) IMPLANT
SHEATH PROBE COVER 6X72 (BAG) IMPLANT

## 2024-04-18 NOTE — Anesthesia Preprocedure Evaluation (Signed)
 Anesthesia Evaluation  Patient identified by MRN, date of birth, ID band Patient awake    Reviewed: Allergy & Precautions, NPO status , Patient's Chart, lab work & pertinent test results  History of Anesthesia Complications Negative for: history of anesthetic complications  Airway Mallampati: II  TM Distance: >3 FB     Dental  (+) Upper Dentures   Pulmonary neg sleep apnea, COPD, Patient abstained from smoking.Not current smoker, former smoker   breath sounds clear to auscultation       Cardiovascular Exercise Tolerance: Good METShypertension, + CAD, + Past MI, + CABG, + Peripheral Vascular Disease and +CHF  + dysrhythmias Atrial Fibrillation  Rhythm:Irregular Rate:Normal   1. Left ventricular ejection fraction, by estimation, is 40 to 45%. The  left ventricle has mildly decreased function. The left ventricle  demonstrates global hypokinesis. The left ventricular internal cavity size  was mildly dilated. There is moderate  concentric left ventricular hypertrophy. Left ventricular diastolic  function could not be evaluated.   2. Right ventricular systolic function is mildly reduced. The right  ventricular size is mildly enlarged.   3. Left atrial size was mildly dilated.   4. Right atrial size was mildly dilated.   5. The mitral valve is normal in structure. Mild mitral valve  regurgitation. No evidence of mitral stenosis.   6. The aortic valve is tricuspid. There is mild calcification of the  aortic valve. Aortic valve regurgitation is not visualized. No aortic  stenosis is present.   7. Aortic dilatation noted. There is borderline dilatation of the  ascending aorta, measuring 37 mm.   8. The inferior vena cava is normal in size with greater than 50%  respiratory variability, suggesting right atrial pressure of 3 mmHg.     Neuro/Psych  PSYCHIATRIC DISORDERS Anxiety     TIA   GI/Hepatic negative GI ROS,neg GERD  ,,(+)      substance abuse  marijuana useNo marijuana use today   Endo/Other  neg diabetes    Renal/GU Renal disease     Musculoskeletal   Abdominal   Peds  Hematology   Anesthesia Other Findings Past Medical History: No date: Anxiety No date: Atypical atrial flutter (HCC) No date: CHF (congestive heart failure) (HCC)     Comment:  EF 45% No date: CKD (chronic kidney disease), stage III (HCC)     Comment:  thelbert 03/23/2017 10/20/2015: Complete heart block (HCC)     Comment:  in setting of inferior MI, now resolved No date: COPD (chronic obstructive pulmonary disease) (HCC)     Comment:  pt denies this hx on 03/23/2017 No date: Coronary artery disease No date: High cholesterol No date: Hypertension 10/2015: Myocardial infarction [facility])     Comment:  massive one; saw scar tissue of possible previous MI               maybe in 2013 (03/23/2017) No date: Overweight No date: Stenosis of right carotid artery 2016: TIA (transient ischemic attack) 10/2015: Ventricular tachycardia (HCC)     Comment:  in setting of MI, now resolved  Reproductive/Obstetrics                              Anesthesia Physical Anesthesia Plan  ASA: 3  Anesthesia Plan: General   Post-op Pain Management:    Induction: Intravenous  PONV Risk Score and Plan: 2 and Ondansetron , Dexamethasone  and Midazolam   Airway Management Planned: Oral ETT  Additional Equipment: None  Intra-op Plan:   Post-operative Plan: Extubation in OR  Informed Consent: I have reviewed the patients History and Physical, chart, labs and discussed the procedure including the risks, benefits and alternatives for the proposed anesthesia with the patient or authorized representative who has indicated his/her understanding and acceptance.     Dental advisory given  Plan Discussed with: CRNA and Anesthesiologist  Anesthesia Plan Comments: (Discussed risks of anesthesia with patient, including PONV, sore  throat, lip/dental/eye damage. Rare risks discussed as well, such as cardiorespiratory and neurological sequelae, and allergic reactions. Discussed the role of CRNA in patient's perioperative care. Patient understands.)         Anesthesia Quick Evaluation

## 2024-04-18 NOTE — Anesthesia Procedure Notes (Signed)
 Procedure Name: Intubation Date/Time: 04/18/2024 9:48 AM  Performed by: Boyce Shilling, CRNAPre-anesthesia Checklist: Patient identified, Emergency Drugs available, Suction available, Timeout performed and Patient being monitored Patient Re-evaluated:Patient Re-evaluated prior to induction Oxygen Delivery Method: Circle system utilized Preoxygenation: Pre-oxygenation with 100% oxygen Induction Type: IV induction Ventilation: Mask ventilation without difficulty and Oral airway inserted - appropriate to patient size Laryngoscope Size: Mac and 4 Grade View: Grade I Tube type: Oral Tube size: 7.5 mm Number of attempts: 1 Airway Equipment and Method: Stylet Placement Confirmation: ETT inserted through vocal cords under direct vision, positive ETCO2, CO2 detector and breath sounds checked- equal and bilateral Secured at: 22 cm Tube secured with: Tape Dental Injury: Teeth and Oropharynx as per pre-operative assessment

## 2024-04-18 NOTE — Discharge Instructions (Signed)
 Cardiac Ablation, Care After  This sheet gives you information about how to care for yourself after your procedure. Your health care provider may also give you more specific instructions. If you have problems or questions, contact your health care provider. What can I expect after the procedure? After the procedure, it is common to have: Bruising around your puncture site. Tenderness around your puncture site. Skipped heartbeats. If you had an atrial fibrillation ablation, you may have atrial fibrillation during the first several months after your procedure.  Tiredness (fatigue).  Follow these instructions at home: Puncture site care  Follow instructions from your health care provider about how to take care of your puncture site. Make sure you: If present, leave stitches (sutures), skin glue, or adhesive strips in place. These skin closures may need to stay in place for up to 2 weeks. If adhesive strip edges start to loosen and curl up, you may trim the loose edges. Do not remove adhesive strips completely unless your health care provider tells you to do that. If a large square bandage is present, this may be removed 24 hours after surgery.  Check your puncture site every day for signs of infection. Check for: Redness, swelling, or pain. Fluid or blood. If your puncture site starts to bleed, lie down on your back, apply firm pressure to the area, and contact your health care provider. Warmth. Pus or a bad smell. A pea or marble sized lump/knot at the site is normal and can take up to three months to resolve.  Driving Do not drive for at least 4 days after your procedure or however long your health care provider recommends. (Do not resume driving if you have previously been instructed not to drive for other health reasons.) Do not drive or use heavy machinery while taking prescription pain medicine. Activity Avoid activities that take a lot of effort for at least 7 days after your  procedure. Do not lift anything that is heavier than 5 lb (4.5 kg) for one week.  No sexual activity for 1 week.  Return to your normal activities as told by your health care provider. Ask your health care provider what activities are safe for you. General instructions Take over-the-counter and prescription medicines only as told by your health care provider. Do not use any products that contain nicotine or tobacco, such as cigarettes and e-cigarettes. If you need help quitting, ask your health care provider. You may shower after 24 hours, but Do not take baths, swim, or use a hot tub for 1 week.  Do not drink alcohol for 24 hours after your procedure. Keep all follow-up visits as told by your health care provider. This is important. Contact a health care provider if: You have redness, mild swelling, or pain around your puncture site. You have fluid or blood coming from your puncture site that stops after applying firm pressure to the area. Your puncture site feels warm to the touch. You have pus or a bad smell coming from your puncture site. You have a fever. You have chest pain or discomfort that spreads to your neck, jaw, or arm. You have chest pain that is worse with lying on your back or taking a deep breath. You are sweating a lot. You feel nauseous. You have a fast or irregular heartbeat. You have shortness of breath. You are dizzy or light-headed and feel the need to lie down. You have pain or numbness in the arm or leg closest to your puncture site.  Get help right away if: Your puncture site suddenly swells. Your puncture site is bleeding and the bleeding does not stop after applying firm pressure to the area. These symptoms may represent a serious problem that is an emergency. Do not wait to see if the symptoms will go away. Get medical help right away. Call your local emergency services (911 in the [place] Do not drive yourself to the hospital. Summary After the procedure, it  is normal to have bruising and tenderness at the puncture site in your groin, neck, or forearm. Check your puncture site every day for signs of infection. Get help right away if your puncture site is bleeding and the bleeding does not stop after applying firm pressure to the area. This is a medical emergency. This information is not intended to replace advice given to you by your health care provider. Make sure you discuss any questions you have with your health care provider. Cardiac Ablation, Care After  This sheet gives you information about how to care for yourself after your procedure. Your health care provider may also give you more specific instructions. If you have problems or questions, contact your health care provider. What can I expect after the procedure? After the procedure, it is common to have: Bruising around your puncture site. Tenderness around your puncture site. Skipped heartbeats. If you had an atrial fibrillation ablation, you may have atrial fibrillation during the first several months after your procedure.  Tiredness (fatigue).  Follow these instructions at home: Puncture site care  Follow instructions from your health care provider about how to take care of your puncture site. Make sure you: If present, leave stitches (sutures), skin glue, or adhesive strips in place. These skin closures may need to stay in place for up to 2 weeks. If adhesive strip edges start to loosen and curl up, you may trim the loose edges. Do not remove adhesive strips completely unless your health care provider tells you to do that. If a large square bandage is present, this may be removed 24 hours after surgery.  Check your puncture site every day for signs of infection. Check for: Redness, swelling, or pain. Fluid or blood. If your puncture site starts to bleed, lie down on your back, apply firm pressure to the area, and contact your health care provider. Warmth. Pus or a bad smell. A pea  or marble sized lump/knot at the site is normal and can take up to three months to resolve.  Driving Do not drive for at least 4 days after your procedure or however long your health care provider recommends. (Do not resume driving if you have previously been instructed not to drive for other health reasons.) Do not drive or use heavy machinery while taking prescription pain medicine. Activity Avoid activities that take a lot of effort for at least 7 days after your procedure. Do not lift anything that is heavier than 5 lb (4.5 kg) for one week.  No sexual activity for 1 week.  Return to your normal activities as told by your health care provider. Ask your health care provider what activities are safe for you. General instructions Take over-the-counter and prescription medicines only as told by your health care provider. Do not use any products that contain nicotine or tobacco, such as cigarettes and e-cigarettes. If you need help quitting, ask your health care provider. You may shower after 24 hours, but Do not take baths, swim, or use a hot tub for 1 week.  Do  not drink alcohol for 24 hours after your procedure. Keep all follow-up visits as told by your health care provider. This is important. Contact a health care provider if: You have redness, mild swelling, or pain around your puncture site. You have fluid or blood coming from your puncture site that stops after applying firm pressure to the area. Your puncture site feels warm to the touch. You have pus or a bad smell coming from your puncture site. You have a fever. You have chest pain or discomfort that spreads to your neck, jaw, or arm. You have chest pain that is worse with lying on your back or taking a deep breath. You are sweating a lot. You feel nauseous. You have a fast or irregular heartbeat. You have shortness of breath. You are dizzy or light-headed and feel the need to lie down. You have pain or numbness in the arm or  leg closest to your puncture site. Get help right away if: Your puncture site suddenly swells. Your puncture site is bleeding and the bleeding does not stop after applying firm pressure to the area. These symptoms may represent a serious problem that is an emergency. Do not wait to see if the symptoms will go away. Get medical help right away. Call your local emergency services (911 in the [place] Do not drive yourself to the hospital. Summary After the procedure, it is normal to have bruising and tenderness at the puncture site in your groin, neck, or forearm. Check your puncture site every day for signs of infection. Get help right away if your puncture site is bleeding and the bleeding does not stop after applying firm pressure to the area. This is a medical emergency. This information is not intended to replace advice given to you by your health care provider. Make sure you discuss any questions you have with your health care provider.  Femoral Site Care This sheet gives you information about how to care for yourself after your procedure. Your health care provider may also give you more specific instructions. If you have problems or questions, contact your health care provider. What can I expect after the procedure?  After the procedure, it is common to have: Bruising that usually fades within 1-2 weeks. Tenderness at the site. Follow these instructions at home: Wound care Follow instructions from your health care provider about how to take care of your insertion site. Make sure you: Wash your hands with soap and water  before you change your bandage (dressing). If soap and water  are not available, use hand sanitizer. Remove your dressing as told by your health care provider. In 24 hours Do not take baths, swim, or use a hot tub until your health care provider approves. You may shower 24-48 hours after the procedure or as told by your health care provider. Gently wash the site with plain  soap and water . Pat the area dry with a clean towel. Do not rub the site. This may cause bleeding. Do not apply powder or lotion to the site. Keep the site clean and dry. Check your femoral site every day for signs of infection. Check for: Redness, swelling, or pain. Fluid or blood. Warmth. Pus or a bad smell. Activity For the first 2-3 days after your procedure, or as long as directed: Avoid climbing stairs as much as possible. Do not squat. Do not lift anything that is heavier than 10 lb (4.5 kg), or the limit that you are told, until your health care provider says that  it is safe. For 5 days Rest as directed. Avoid sitting for a long time without moving. Get up to take short walks every 1-2 hours. Do not drive for 24 hours if you were given a medicine to help you relax (sedative). General instructions Take over-the-counter and prescription medicines only as told by your health care provider. Keep all follow-up visits as told by your health care provider. This is important. Contact a health care provider if you have: A fever or chills. You have redness, swelling, or pain around your insertion site. Get help right away if: The catheter insertion area swells very fast. You pass out. You suddenly start to sweat or your skin gets clammy. The catheter insertion area is bleeding, and the bleeding does not stop when you hold steady pressure on the area. The area near or just beyond the catheter insertion site becomes pale, cool, tingly, or numb. These symptoms may represent a serious problem that is an emergency. Do not wait to see if the symptoms will go away. Get medical help right away. Call your local emergency services (911 in the [place] Do not drive yourself to the hospital. Summary After the procedure, it is common to have bruising that usually fades within 1-2 weeks. Check your femoral site every day for signs of infection. Do not lift anything that is heavier than 10 lb (4.5 kg),  or the limit that you are told, until your health care provider says that it is safe. This information is not intended to replace advice given to you by your health care provider. Make sure you discuss any questions you have with your health care provider. Document Revised: 08/07/2017 Document Reviewed: 08/07/2017 Elsevier Patient Education  2020 [employer].

## 2024-04-18 NOTE — Progress Notes (Signed)
 After ambulation in the hallway, R groin dressing was noted to have scant draining on the gauze. PT returned to bed, dressing was removed site was evaluated, no s/s of active bleeding. Skin around incision remained soft, indications of complication under the skin. New dressing was put in place. PT ambulated again with no s/s of complications.

## 2024-04-18 NOTE — H&P (Signed)
  Electrophysiology Office Note:   Date:  04/18/2024  ID:  [name], DOB Mar 28, 1961, MRN [id number]  Primary Cardiologist: Ezra Shuck, MD Primary Heart Failure: None Electrophysiologist: Vola Beneke Gladis Norton, MD      History of Present Illness:   Christopher [name] is a 63 y.o. male with h/o coronary disease post CABG, chronic systolic heart failure, atrial fibrillation/flutter seen today for routine electrophysiology followup.   Since last being seen in our clinic the patient reports doing overall well.  He he has no acute symptoms.  He did go to heart failure clinic and was noted to be in slow atrial fibrillation.  He has not had a recurrence of his atrial flutter.  His carvedilol  dose was stopped due to bradycardia.  He would like to get back into normal rhythm..    Today, denies symptoms of palpitations, chest pain, dyspnea, orthopnea, PND, lower extremity edema, claudication, dizziness, presyncope, syncope, bleeding, or neurologic sequela. The patient is tolerating medications without difficulties. Plan ablation today.   EP Information / Studies Reviewed:    EKG is not ordered today. EKG from 12/21/2023 reviewed which showed atrial fibrillation        Risk Assessment/Calculations:    CHA2DS2-VASc Score = 5   This indicates a 7.2% annual risk of stroke. The patient's score is based upon: CHF History: 1 HTN History: 1 Diabetes History: 0 Stroke History: 2 Vascular Disease History: 1 Age Score: 0 Gender Score: 0            Physical Exam:   VS:  BP (!) 154/84   Pulse (!) 57   Temp 97.8 F (36.6 C) (Oral)   Resp 17   Ht 5' 10 (1.778 m)   Wt 99.8 kg   SpO2 97%   BMI 31.57 kg/m    Wt Readings from Last 3 Encounters:  04/18/24 99.8 kg  02/01/24 100.1 kg  01/16/24 99.7 kg    GEN: Well nourished, well developed in no acute distress NECK: No JVD; No carotid bruits CARDIAC: Regular rate and rhythm, no murmurs, rubs, gallops RESPIRATORY:  Clear to  auscultation without rales, wheezing or rhonchi  ABDOMEN: Soft, non-tender, non-distended EXTREMITIES:  No edema; No deformity    ASSESSMENT AND PLAN:    1.  Persistent atrial fibrillation/flutter: Christopher [name] has presented today for surgery, with the diagnosis of AF.  The various methods of treatment have been discussed with the patient and family. After consideration of risks, benefits and other options for treatment, the patient has consented to  Procedure(s): Catheter ablation as a surgical intervention .  Risks include but not limited to complete heart block, stroke, esophageal damage, nerve damage, bleeding, vascular damage, tamponade, perforation, MI, and death. The patient's history has been reviewed, patient examined, no change in status, stable for surgery.  I have reviewed the patient's chart and labs.  Questions were answered to the patient's satisfaction.    Angelino Rumery Norton, MD 04/18/2024 8:52 AM

## 2024-04-18 NOTE — Anesthesia Postprocedure Evaluation (Signed)
 Anesthesia Post Note  Patient: Christopher [name]  Procedure(s) Performed: ATRIAL FIBRILLATION ABLATION     Patient location during evaluation: PACU Anesthesia Type: General Level of consciousness: awake and alert Pain management: pain level controlled Vital Signs Assessment: post-procedure vital signs reviewed and stable Respiratory status: spontaneous breathing, nonlabored ventilation, respiratory function stable and patient connected to nasal cannula oxygen Cardiovascular status: blood pressure returned to baseline and stable Postop Assessment: no apparent nausea or vomiting Anesthetic complications: no   There were no known notable events for this encounter.  Last Vitals:  Vitals:   04/18/24 1143 04/18/24 1150  BP: 124/69   Pulse: (!) 48 (!) 50  Resp: 12 13  Temp:    SpO2: 93% 93%    Last Pain:  Vitals:   04/18/24 1123  TempSrc: Oral  PainSc:                  Rome Ade

## 2024-04-18 NOTE — Progress Notes (Signed)
 Discharge instructions reviewed with patient and wife at bedside. Denies questions or concerns at this time. PT was able to tolerate meals and drinks without complications. PT ambulated to the bathroom where he was able to void. PT escorted from the unit via wheel chair to personal vehicle

## 2024-04-18 NOTE — Transfer of Care (Signed)
 Immediate Anesthesia Transfer of Care Note  Patient: Christopher [name]  Procedure(s) Performed: ATRIAL FIBRILLATION ABLATION  Patient Location: PACU  Anesthesia Type:General  Level of Consciousness: awake, alert , and oriented  Airway & Oxygen Therapy: Patient Spontanous Breathing and Patient connected to nasal cannula oxygen  Post-op Assessment: Report given to RN and Post -op Vital signs reviewed and stable  Post vital signs: Reviewed and stable  Last Vitals:  Vitals Value Taken Time  BP    Temp    Pulse 46 04/18/24 11:35  Resp 15 04/18/24 11:35  SpO2 95 % 04/18/24 11:35  Vitals shown include unfiled device data.  Last Pain:  Vitals:   04/18/24 0832  TempSrc:   PainSc: 0-No pain         Complications: There were no known notable events for this encounter.

## 2024-04-19 ENCOUNTER — Telehealth (HOSPITAL_COMMUNITY): Payer: Self-pay

## 2024-04-19 ENCOUNTER — Encounter (HOSPITAL_COMMUNITY): Payer: Self-pay | Admitting: Cardiology

## 2024-04-19 MED FILL — Fentanyl Citrate Preservative Free (PF) Inj 100 MCG/2ML: INTRAMUSCULAR | Qty: 2 | Status: AC

## 2024-04-19 NOTE — Telephone Encounter (Signed)
 Spoke with patient to complete post procedure follow up call.  Patient reports no complications with groin sites.   Instructions reviewed with patient:  Remove large bandage at puncture site after 24 hours. It is normal to have bruising, tenderness, mild swelling, and a pea or marble sized lump/knot at the groin site which can take up to three months to resolve.  Get help right away if you notice sudden swelling at the puncture site.  Check your puncture site every day for signs of infection: fever, redness, swelling, pus drainage, warmth, foul odor or excessive pain. If this occurs, please call [phone], to speak with the RN Navigator. Get help right away if your puncture site is bleeding and the bleeding does not stop after applying firm pressure to the area.  You may continue to have skipped beats/ atrial fibrillation during the first several months after your procedure.  It is very important not to miss any doses of your blood thinner Eliquis .    You will follow up with the Afib clinic on 05/23/24 and follow up with Dr.Will Camnitz on 07/24/24.    Patient verbalized understanding to all instructions provided.

## 2024-05-13 ENCOUNTER — Other Ambulatory Visit (HOSPITAL_COMMUNITY): Payer: Self-pay | Admitting: Cardiology

## 2024-05-13 DIAGNOSIS — I5022 Chronic systolic (congestive) heart failure: Secondary | ICD-10-CM

## 2024-05-23 ENCOUNTER — Ambulatory Visit (HOSPITAL_COMMUNITY)
Admission: RE | Admit: 2024-05-23 | Discharge: 2024-05-23 | Disposition: A | Discharge status: Home / Self Care | Source: Ambulatory Visit | Attending: Internal Medicine | Admitting: Internal Medicine

## 2024-05-23 ENCOUNTER — Encounter (HOSPITAL_COMMUNITY): Payer: Self-pay | Admitting: Internal Medicine

## 2024-05-23 VITALS — BP 180/82 | HR 54 | Ht 70.0 in | Wt 218.6 lb

## 2024-05-23 DIAGNOSIS — I480 Paroxysmal atrial fibrillation: Secondary | ICD-10-CM | POA: Diagnosis not present

## 2024-05-23 DIAGNOSIS — D6869 Other thrombophilia: Secondary | ICD-10-CM

## 2024-05-23 NOTE — Progress Notes (Signed)
 Primary Care Physician: Lord Almyra POUR, FNP Primary Cardiologist: Ezra Shuck, MD Electrophysiologist: Will Gladis Norton, MD     Referring Physician: Dr. Norton Zachary KATHEE Christopher [name] is a 63 y.o. male with a history of CAD s/p CABG, chronic systolic CHF, TIA, HLD, carotid stenosis s/p right CEA, atrial fibrillation who presents for consultation in the [facility]. Patient is on Eliquis  for stroke prevention.  On evaluation today, patient is currently in NSR. S/p Afib ablation on 04/18/24 by Dr. Norton. No episodes of Afib since ablation. He is doing great. He notes to be bicycling and playing golf frequently and without issue. No chest pain or SOB. Leg sites healed without issue. No missed doses of anticoagulant.  Today, he denies symptoms of orthopnea, PND, lower extremity edema, dizziness, presyncope, syncope, snoring, daytime somnolence, bleeding, or neurologic sequela. The patient is tolerating medications without difficulties and is otherwise without complaint today.    he has a BMI of Body mass index is 31.37 kg/m.SABRA Filed Weights   05/23/24 1111  Weight: 99.2 kg    Current Outpatient Medications  Medication Sig Dispense Refill   acetaminophen  (TYLENOL ) 500 MG tablet Take 1,000 mg by mouth every 6 (six) hours as needed for mild pain or headache.      apixaban  (ELIQUIS ) 5 MG TABS tablet Take 1 tablet (5 mg total) by mouth 2 (two) times daily. 60 tablet 11   ezetimibe  (ZETIA ) 10 MG tablet Take 1 tablet by mouth once daily 90 tablet 1   famotidine  (PEPCID ) 40 MG tablet Take 40 mg by mouth daily.     FARXIGA  10 MG TABS tablet TAKE 1 TABLET BY MOUTH ONCE DAILY BEFORE BREAKFAST 90 tablet 1   fenofibrate  (TRICOR ) 145 MG tablet Take 1 tablet by mouth once daily 90 tablet 1   hydrALAZINE  (APRESOLINE ) 50 MG tablet Take 2 tablets (100 mg total) by mouth 3 (three) times daily. 540 tablet 3   isosorbide  mononitrate (IMDUR ) 60 MG 24 hr tablet Take 1  tablet by mouth once daily 90 tablet 3   potassium chloride  SA (KLOR-CON  M20) 20 MEQ tablet TAKE 1 & 1/2 (ONE & ONE-HALF) TABLETS BY MOUTH ONCE DAILY 90 tablet 5   QUEtiapine  (SEROQUEL ) 50 MG tablet Take 50 mg by mouth at bedtime.  11   rosuvastatin  (CRESTOR ) 40 MG tablet Take 1 tablet by mouth once daily 90 tablet 3   spironolactone  (ALDACTONE ) 25 MG tablet Take 1 tablet by mouth once daily 90 tablet 3   torsemide  (DEMADEX ) 20 MG tablet TAKE 1 & 1/2 (ONE & ONE-HALF) TABLETS BY MOUTH ONCE DAILY 180 tablet 3   VASCEPA  1 g capsule Take 2 capsules by mouth twice daily 360 capsule 0   No current facility-administered medications for this encounter.    Atrial Fibrillation Management history:  Previous antiarrhythmic drugs: none Previous cardioversions: none Previous ablations: 04/18/24 Anticoagulation history: Eliquis    ROS- All systems are reviewed and negative except as per the HPI above.  Physical Exam: BP (!) 180/82   Pulse (!) 54   Ht 5' 10 (1.778 m)   Wt 99.2 kg   BMI 31.37 kg/m   GEN: Well nourished, well developed in no acute distress NECK: No JVD; No carotid bruits CARDIAC: Regular bradycardic rate and rhythm, no murmurs, rubs, gallops RESPIRATORY:  Clear to auscultation without rales, wheezing or rhonchi  ABDOMEN: Soft, non-tender, non-distended EXTREMITIES:  No edema; No deformity   EKG today demonstrates  Vent.  rate 54 BPM PR interval 224 ms QRS duration 116 ms QT/QTcB 474/449 ms P-R-T axes * -7 95 Sinus bradycardia with marked sinus arrhythmia with 1st degree A-V block Inferior infarct (cited on or before 24-Nov-2017) Anterolateral infarct (cited on or before 24-Nov-2017) Abnormal ECG When compared with ECG of 18-Apr-2024 11:29, Sinus rhythm has replaced Junctional rhythm  Echo 12/21/23 demonstrated  1. Left ventricular ejection fraction, by estimation, is 40 to 45%. The  left ventricle has mildly decreased function. The left ventricle  demonstrates global  hypokinesis. The left ventricular internal cavity size  was mildly dilated. There is moderate  concentric left ventricular hypertrophy. Left ventricular diastolic  function could not be evaluated.   2. Right ventricular systolic function is mildly reduced. The right  ventricular size is mildly enlarged.   3. Left atrial size was mildly dilated.   4. Right atrial size was mildly dilated.   5. The mitral valve is normal in structure. Mild mitral valve  regurgitation. No evidence of mitral stenosis.   6. The aortic valve is tricuspid. There is mild calcification of the  aortic valve. Aortic valve regurgitation is not visualized. No aortic  stenosis is present.   7. Aortic dilatation noted. There is borderline dilatation of the  ascending aorta, measuring 37 mm.   8. The inferior vena cava is normal in size with greater than 50%  respiratory variability, suggesting right atrial pressure of 3 mmHg.    ASSESSMENT & PLAN CHA2DS2-VASc Score = 5  The patient's score is based upon: CHF History: 1 HTN History: 1 Diabetes History: 0 Stroke History: 2 Vascular Disease History: 1 Age Score: 0 Gender Score: 0       ASSESSMENT AND PLAN: Persistent Atrial Fibrillation (ICD10:  I48.19) The patient's CHA2DS2-VASc score is 5, indicating a 7.2% annual risk of stroke.   S/p Afib ablation on 04/18/24 by Dr. Inocencio.  Patient is currently in NSR. His coreg  was stopped due to marked bradycardia. He is feeling better off it.   Secondary Hypercoagulable State (ICD10:  D68.69) The patient is at significant risk for stroke/thromboembolism based upon his CHA2DS2-VASc Score of 5.  Continue Apixaban  (Eliquis ).  Continue Eliquis .   Hypertension  He notes his BP reading is much better at home. Monitor BP at home and contact clinic if remains elevated.    Follow up with EP as scheduled.   Terra Pac, [facility]  Afib Clinic [address] [place], [place] [address] [phone]

## 2024-05-31 ENCOUNTER — Other Ambulatory Visit (HOSPITAL_COMMUNITY): Payer: Self-pay | Admitting: Cardiology

## 2024-06-27 ENCOUNTER — Encounter (HOSPITAL_COMMUNITY): Payer: Self-pay | Admitting: Cardiology

## 2024-07-06 ENCOUNTER — Other Ambulatory Visit (HOSPITAL_COMMUNITY): Payer: Self-pay | Admitting: Cardiology

## 2024-07-11 ENCOUNTER — Encounter (HOSPITAL_COMMUNITY): Payer: Self-pay | Admitting: Cardiology

## 2024-07-11 ENCOUNTER — Ambulatory Visit (HOSPITAL_COMMUNITY)
Admission: RE | Admit: 2024-07-11 | Discharge: 2024-07-11 | Disposition: A | Source: Ambulatory Visit | Attending: Cardiology | Admitting: Cardiology

## 2024-07-11 ENCOUNTER — Ambulatory Visit (HOSPITAL_COMMUNITY): Payer: Self-pay | Admitting: Cardiology

## 2024-07-11 VITALS — BP 160/92 | HR 57 | Ht 70.0 in | Wt 220.8 lb

## 2024-07-11 DIAGNOSIS — I5022 Chronic systolic (congestive) heart failure: Secondary | ICD-10-CM

## 2024-07-11 DIAGNOSIS — I739 Peripheral vascular disease, unspecified: Secondary | ICD-10-CM

## 2024-07-11 LAB — BRAIN NATRIURETIC PEPTIDE: B Natriuretic Peptide: 122.4 pg/mL — ABNORMAL HIGH (ref 0.0–100.0)

## 2024-07-11 LAB — BASIC METABOLIC PANEL WITH GFR
Anion gap: 8 (ref 5–15)
BUN: 30 mg/dL — ABNORMAL HIGH (ref 8–23)
CO2: 28 mmol/L (ref 22–32)
Calcium: 9.4 mg/dL (ref 8.9–10.3)
Chloride: 101 mmol/L (ref 98–111)
Creatinine, Ser: 1.75 mg/dL — ABNORMAL HIGH (ref 0.61–1.24)
GFR, Estimated: 43 mL/min — ABNORMAL LOW (ref >60)
Glucose, Bld: 123 mg/dL — ABNORMAL HIGH (ref 70–99)
Potassium: 3.7 mmol/L (ref 3.5–5.1)
Sodium: 137 mmol/L (ref 135–145)

## 2024-07-11 LAB — LIPID PANEL
Cholesterol: 89 mg/dL (ref 0–200)
HDL: 46 mg/dL (ref >40)
LDL Cholesterol: 26 mg/dL (ref 0–99)
Total CHOL/HDL Ratio: 1.9 ratio
Triglycerides: 85 mg/dL (ref <150)
VLDL: 17 mg/dL (ref 0–40)

## 2024-07-11 NOTE — Progress Notes (Signed)
 ID:  Christopher [name], DOB Jul 30, 1961, MRN [id number]   Provider location: [place] [facility] Type of Visit: Established patient   PCP: Lord Almyra POUR, FNP  Cardiologist:  Ezra Shuck, MD  Chief complaint: CAD, CHF   History of Present Illness:  63 y.o. with history of CAD s/p CABG, ischemic cardiomyopathy, atrial fibrillation, and CKD.  In 3/17, he came to the hospital with a late presentation inferior MI.  He was noted to have occluded RCA with collaterals and 95% proximal LAD.  He had CABG x 4 + right CEA.  He had a balloon pump with cardiogenic shock.  He additionally had multiple episodes of post-op VT/VF.  Amiodarone  was started.  Repeat cath showed slow flow in the SVG-D and SVG-ramus given diffusely diseased and small distal targets.  He developed AKI.  He had prolonged mechanical ventilation and a tracheostomy.  He had RV failure that was treated with Revatio .  He had transient atrial flutter that resolved quickly.  Last echo in 3/17 showed EF 30-35%.  Finally, the tracheostomy was removed.  He went to inpatient rehab for about a week and is now home again. Echo 6/17 with EF 40-45%.   Admitted 8/21-8/27/18 with acute respiratory failure due to HCAP and HTN emergency. Also had rapid atrial flutter. Rate improved and DCCV was planned for outpatient as he had missed a few doses of Eliquis  at home. Discharge weight was 270 pounds. He had DCCV to NSR in 9/18.  He was admitted again in 4/19 after syncopal episode and was found to be in atrial flutter again.  He had a LINQ monitor placed and was cardioverted back to NSR.   Atrial flutter ablation in 5/19.    Echo in 7/21 showed EF 40-45%, inferior/inferoseptal hypokinesis, mildly decreased RV systolic function.  Echo in 11/22 showed EF 45%, mild LVH and mild LV enlargement, mild RV enlargement with mildly decreased systolic function. Echo in 3/24 showed EF 45% with mild LVH, mild LV dilation, grade 2 diastolic dysfunction,  mildly decreased RV systolic function.   Patient had an episode in 8/24 where he had left arm weakness for about 10 minutes.  EMS came out to his house, thought he had had a TIA, but given resolution, they did not take him to the ER.  He has not had any further neurologic symptoms/signs.   Zio monitor (10/24) showed no atrial fibrillation but did show 242 pauses, longest was 5.6 seconds.  I had him decrease Coreg .  He saw EP, and given improved HR with lower Coreg , they elected not to place pacemaker.   Echo in 5/25 showed EF 40-45%, moderate LVH, mildly decreased RV systolic function, mild MR.  In 5/25, patient was found to be in slow atrial fibrillation with rate in upper 30s.  He was fatigued but no lightheadedness or syncope. Zio monitor in 6/25 showed 3% AF, 4 pauses up to 3.3 seconds with high grade AVB. Coreg  was stopped.  Patient was seen by EP, in 9/25 he had an atrial fibrillation ablation.   He returns today for followup of CHF, CAD, and atrial fibrillation.  He is in sinus bradycardia today with rate around 50 bpm.  No lightheadedness or syncope. He is feeling generally good.  He golfs several times/week. He rides a stationary bike at his house.  No exertional dyspnea or chest pain.  No orthopnea/PND.  No claudication. Fatigue has resolved since he had AF ablation. Weight stable.  BP elevated but he is stressed  out from driving in traffic.  SBP 120s at home, checks several times/week.   ECG (personally reviewed): NSR rate 49, blocked PAC  Labs (7/24): K 4.6, creatinine 1.89, LDL 33, TGs 187 Labs (1/25): LDL 41, BNP 345, K 3.9, creatinine 1.78 Labs (8/25): K 4, creatinine 1.95, hgb 18.2  PMH: 1. CAD: Late presentation inferior MI in 3/17.  - LHC showed totally occluded pRCA with L=>R collaterals, 95% pLAD, 70% ramus.   - CABG with LIMA-LAD, SVG-D, SVG-ramus, SVG-RCA.  - Repeat echo in 3/17 after CABG showed patent LIMA and SVG-RCA but SVG-D and SVG- ramus had slow flow with small,  diffusely diseased distal targets.  - Echo (6/17) with EF 40-45%, inferior severe hypokinesis, mildly dilated RV with normal systolic function, PASP 30 mmHg.  2. Chronic systolic CHF: Ischemic cardiomyopathy.   - Echo (3/17) with EF 30-35%, septal and inferior AK.   - Echo (7/18): EF 40-45%, moderate FBSH, grade II diastolic dysfunction, RV mildly dilated with normal systolic function.  - Echo (4/19): EF 40-45%, mild LV dilation - Echo (7/21): EF 40-45%, inferior/inferoseptal hypokinesis, grade II diastolic dysfunction, mildly decreased RV systolic function, PASP 42 mmHg.  - Echo (11/22): EF 45%, mild LVH and mild LV enlargement, mild RV enlargement with mildly decreased systolic function. - Echo (3/24): EF 45% with mild LVH, mild LV dilation, grade 2 diastolic dysfunction, mildly decreased RV systolic function.  - Echo (5/25): EF 40-45%, moderate LVH, mildly decreased RV systolic function, mild MR. 3. RV failure: was on Revatio , now off.  4. CKD: Stage 3.  5. Atrial flutter:    - DCCV to NSR in 9/18. - DCCV to NSR in 4/19.   - Atrial flutter ablation.  6. VT: Post-op CABG.  7. Carotid stenosis: Right CEA 3/17.  - Carotid dopplers (9/18): 1-39% LICA, s/p R CEA.  - Carotid dopplers (10/19): 1-39% LICA, s/p right CEA - Carotid dopplers (11/22): Mild disease.  - Carotid dopplers (10/24): Minimal disease, s/p right CEA.  8. PAD: Peripheral artery dopplers (7/17) with > 50% L CIA stenosis, occluded distal external iliac, occluded right SFA, occluded left SFA.  - ABIs (11/22): moderate disease with ABI 0.74 R, 0.64 L - ABIs (10/24): 0.74 R, 0.6 L.  9. Peripheral neuropathy 10. Syncope: 4/19, had LINQ monitor.  11. TIA: 8/24, left arm weakness.  12. Bradycardia: Zio monitor (10/24) showed 242 pauses, longest was 5.6 seconds.  Coreg  decreased.  - Zio monitor (6/25): 3% AF, 4 pauses up to 3.3 seconds with high grade AVB. Coreg  stopped.  13. Atrial fibrillation: AF ablation in 9/25.   SH:  Married, former teaching laboratory technician now unemployed, no ETOH or smoking.   Family History  Problem Relation Age of Onset   Heart disease Mother    Failure to thrive Father    Congestive Heart Failure Father        Deceased   Breast cancer Sister        Living   Review of systems complete and found to be negative unless listed in HPI.   Current Outpatient Medications  Medication Sig Dispense Refill   acetaminophen  (TYLENOL ) 500 MG tablet Take 1,000 mg by mouth every 6 (six) hours as needed for mild pain or headache.      apixaban  (ELIQUIS ) 5 MG TABS tablet Take 1 tablet (5 mg total) by mouth 2 (two) times daily. 60 tablet 11   ezetimibe  (ZETIA ) 10 MG tablet Take 1 tablet by mouth once daily 90 tablet 1  famotidine  (PEPCID ) 40 MG tablet Take 40 mg by mouth daily.     FARXIGA  10 MG TABS tablet TAKE 1 TABLET BY MOUTH ONCE DAILY BEFORE BREAKFAST 90 tablet 0   fenofibrate  (TRICOR ) 145 MG tablet Take 1 tablet by mouth once daily 90 tablet 0   hydrALAZINE  (APRESOLINE ) 50 MG tablet Take 2 tablets (100 mg total) by mouth 3 (three) times daily. 540 tablet 3   isosorbide  mononitrate (IMDUR ) 60 MG 24 hr tablet Take 1 tablet by mouth once daily 90 tablet 3   potassium chloride  SA (KLOR-CON  M20) 20 MEQ tablet TAKE 1 & 1/2 (ONE & ONE-HALF) TABLETS BY MOUTH ONCE DAILY 90 tablet 5   QUEtiapine  (SEROQUEL ) 50 MG tablet Take 50 mg by mouth at bedtime.  11   rosuvastatin  (CRESTOR ) 40 MG tablet Take 1 tablet by mouth once daily 90 tablet 3   spironolactone  (ALDACTONE ) 25 MG tablet Take 1 tablet by mouth once daily 90 tablet 0   torsemide  (DEMADEX ) 20 MG tablet TAKE 1 & 1/2 (ONE & ONE-HALF) TABLETS BY MOUTH ONCE DAILY 180 tablet 3   VASCEPA  1 g capsule Take 2 capsules by mouth twice daily 360 capsule 0   No current facility-administered medications for this encounter.   Vitals:   07/11/24 1039  BP: (!) 160/92  Pulse: (!) 57  SpO2: 98%  Weight: 100.2 kg (220 lb 12.8 oz)  Height: 5' 10 (1.778 m)     Wt Readings from Last 3 Encounters:  07/11/24 100.2 kg (220 lb 12.8 oz)  05/23/24 99.2 kg (218 lb 9.6 oz)  04/18/24 99.8 kg (220 lb)    Exam:   BP (!) 160/92   Pulse (!) 57   Ht 5' 10 (1.778 m)   Wt 100.2 kg (220 lb 12.8 oz)   SpO2 98%   BMI 31.68 kg/m  General: NAD Neck: No JVD, no thyromegaly or thyroid  nodule.  Lungs: Clear to auscultation bilaterally with normal respiratory effort. CV: Nondisplaced PMI.  Heart regular S1/S2, no S3/S4, no murmur.  No peripheral edema.  No carotid bruit.  Difficult to palpate pedal pulses.  Abdomen: Soft, nontender, no hepatosplenomegaly, no distention.  Skin: Intact without lesions or rashes.  Neurologic: Alert and oriented x 3.  Psych: Normal affect. Extremities: No clubbing or cyanosis.  HEENT: Normal.   Assessment/Plan: 1. CAD: s/p CABG. No chest pain.    - Continue Crestor , Zetia  and Vascepa . Check lipids today.    - No ASA given Eliquis  use.  2. Atrial flutter/atrial fibrillation: Initially occurred post operatively and was transient. Noted to be in Aflutter at office visit in 7/18 and again at hospitalization in 8/18. He had DCCV in 9/18.  He had atrial flutter again in 4/19 and was cardioverted.  In 5/25,  slow atrial fibrillation rate 39 noted. Coreg  stopped and he had AF ablation in 9/25.  He is in NSR today.    - Continue Eliquis  5 mg bid.  3. Chronic systolic CHF: Ischemic cardiomyopathy.  Echo in 3/17 with EF 30-35%, improved to 40-45% on echo in 6/17.  Echo 02/2017 EF 40-45%, echo 4/19 with EF 40-45%.  Echo in 7/21 showed stable EF 40-45%.  Echo in 11/22 showed EF 45% with mild RV dysfunction.  Echo in 3/24 showed EF 45% with mild LVH, mild LV dilation, grade 2 diastolic dysfunction, mildly decreased RV systolic function. Echo in 5/25 showed stable EF 40-45%, moderate LVH, mildly decreased RV systolic function, mild MR.  He is not volume overloaded on  exam, NYHA class I-II symptoms.   - He is off Coreg  due to bradycardia.    -  Continue hydralazine  100 mg tid and Imdur  60 mg daily.   - Continue torsemide  30 mg daily, BMET/BNP today.  - Continue spironolactone  25 mg daily.   - Continue Farxiga  10 mg daily.  - EF above ICD range.  - He had AKI with ARB and with Entresto  so not on either med.   5. VT: No further.  - Off amiodarone .  6. CKD stage III: BMET today.  7. Carotid stenosis: s/p R CEA in 2017.   - Due for repeat carotid dopplers.  8. Hyperlipidemia: Continue Crestor , fenofibrate , Zetia , and Vascepa .  Check lipids today.  9. PAD: Claudication improved with walking program, not markedly symptomatic at this point.  No rest pain or pedal ulcerations.  ABIs in 10/24 with stable moderate disease.  - I will arrange for repeat ABIs.  10. Obesity: He has lost a lot of weight with diet/exercise.  11. TIA: Suspect TIA with transient left arm weakness in 8/24.  No recurrence.   - Continue Eliquis .    12. Bradycardia: Zio monitor in 10/24 showed 242 pauses, longest was 5.6 seconds.  Zio monitor in 6/25 showed 3% AF, 4 pauses up to 3.3 seconds with high grade AVB.  He is off Coreg , HR now NSR around 50.  No bradycardic symptoms.  - He has not wanted to do a sleep study,  - He should go to the ER if he has a syncopal episode.  - Avoid nodal blockers.    Followup in 6 months  I spent 32 minutes reviewing records, interviewing/examining patient, and managing orders.    Signed, Ezra Shuck, MD  07/11/2024   [facility] [address] [facility] [place] [address] [phone] (office) [phone] (fax)

## 2024-07-11 NOTE — Patient Instructions (Addendum)
 There has been no changes to your medications.  Labs done today, your results will be available in MyChart, we will contact you for abnormal readings.  Your physician has requested that you have an echocardiogram. Echocardiography is a painless test that uses sound waves to create images of your heart. It provides your doctor with information about the size and shape of your heart and how well your heart's chambers and valves are working. This procedure takes approximately one hour. There are no restrictions for this procedure. Please do NOT wear cologne, perfume, aftershave, or lotions (deodorant is allowed). Please arrive 15 minutes prior to your appointment time.  Please note: We ask at that you not bring children with you during ultrasound (echo/ vascular) testing. Due to room size and safety concerns, children are not allowed in the ultrasound rooms during exams. Our front office staff cannot provide observation of children in our lobby area while testing is being conducted. An adult accompanying a patient to their appointment will only be allowed in the ultrasound room at the discretion of the ultrasound technician under special circumstances. We apologize for any inconvenience.  Your provider has ordered ABI's for you. You will be called to have this test arranged.  Your physician recommends that you schedule a follow-up appointment in: 6 months ( June 2026) ** PLEASE CALL THE OFFICE IN APRIL TO ARRANGE YOUR FOLLOW UP APPOINTMENT.**  If you have any questions or concerns before your next appointment please send us  a message through [place] or call our office at [phone].    TO LEAVE A MESSAGE FOR THE NURSE SELECT OPTION 2, PLEASE LEAVE A MESSAGE INCLUDING: YOUR NAME DATE OF BIRTH CALL BACK NUMBER REASON FOR CALL**this is important as we prioritize the call backs  YOU WILL RECEIVE A CALL BACK THE SAME DAY AS LONG AS YOU CALL BEFORE 4:00 PM  At the [facility], you  and your health needs are our priority. As part of our continuing mission to provide you with exceptional heart care, we have created designated Provider Care Teams. These Care Teams include your primary Cardiologist (physician) and Advanced Practice Providers (APPs- Physician Assistants and Nurse Practitioners) who all work together to provide you with the care you need, when you need it.   You may see any of the following providers on your designated Care Team at your next follow up: Dr Toribio Fuel Dr Ezra Shuck Dr. Morene Brownie Greig Mosses, NP Caffie Shed, [place] [facility], [place] Beckey Coe, NP Jordan Lee, NP Ellouise Class, NP Tinnie Redman, PharmD Jaun Bash, PharmD   Please be sure to bring in all your medications bottles to every appointment.    Thank you for choosing [facility]

## 2024-07-13 ENCOUNTER — Other Ambulatory Visit (HOSPITAL_COMMUNITY): Payer: Self-pay | Admitting: Cardiology

## 2024-07-18 ENCOUNTER — Encounter (HOSPITAL_COMMUNITY): Payer: Self-pay | Admitting: Cardiology

## 2024-07-19 MED ORDER — AMLODIPINE BESYLATE 5 MG PO TABS: 5.0000 mg | ORAL_TABLET | Freq: Every day | ORAL | 3 refills | Status: AC

## 2024-07-24 ENCOUNTER — Ambulatory Visit: Admitting: Cardiology

## 2024-07-24 ENCOUNTER — Telehealth (HOSPITAL_COMMUNITY): Payer: Self-pay

## 2024-07-24 NOTE — Telephone Encounter (Signed)
 FMLA apaer work faxed to [employer] at [phone]. My chart message sent to patient to inform him.

## 2024-07-25 ENCOUNTER — Ambulatory Visit (HOSPITAL_COMMUNITY): Admitting: Physician Assistant

## 2024-07-29 ENCOUNTER — Encounter (HOSPITAL_COMMUNITY): Payer: Self-pay

## 2024-08-22 ENCOUNTER — Ambulatory Visit: Admitting: Cardiology

## 2024-08-28 ENCOUNTER — Other Ambulatory Visit (HOSPITAL_COMMUNITY): Payer: Self-pay | Admitting: Cardiology

## 2024-09-27 ENCOUNTER — Ambulatory Visit: Admitting: Student
